# Patient Record
Sex: Male | Born: 1935
Health system: Southern US, Community
[De-identification: ages and names within clinical notes are randomized; demographics above are authoritative.]

## PROBLEM LIST (undated history)

## (undated) DIAGNOSIS — G4733 Obstructive sleep apnea (adult) (pediatric): Secondary | ICD-10-CM

## (undated) DIAGNOSIS — F5104 Psychophysiologic insomnia: Secondary | ICD-10-CM

## (undated) DIAGNOSIS — C801 Malignant (primary) neoplasm, unspecified: Secondary | ICD-10-CM

## (undated) DIAGNOSIS — M545 Low back pain, unspecified: Secondary | ICD-10-CM

## (undated) DIAGNOSIS — M4647 Discitis, unspecified, lumbosacral region: Secondary | ICD-10-CM

## (undated) DIAGNOSIS — E039 Hypothyroidism, unspecified: Secondary | ICD-10-CM

## (undated) DIAGNOSIS — I5030 Unspecified diastolic (congestive) heart failure: Secondary | ICD-10-CM

## (undated) DIAGNOSIS — Z9889 Other specified postprocedural states: Secondary | ICD-10-CM

## (undated) DIAGNOSIS — Z8701 Personal history of pneumonia (recurrent): Secondary | ICD-10-CM

## (undated) DIAGNOSIS — I2510 Atherosclerotic heart disease of native coronary artery without angina pectoris: Secondary | ICD-10-CM

## (undated) DIAGNOSIS — I4892 Unspecified atrial flutter: Secondary | ICD-10-CM

## (undated) DIAGNOSIS — G8929 Other chronic pain: Secondary | ICD-10-CM

## (undated) DIAGNOSIS — R112 Nausea with vomiting, unspecified: Secondary | ICD-10-CM

## (undated) DIAGNOSIS — K5909 Other constipation: Secondary | ICD-10-CM

## (undated) DIAGNOSIS — Z9989 Dependence on other enabling machines and devices: Secondary | ICD-10-CM

## (undated) DIAGNOSIS — R0600 Dyspnea, unspecified: Secondary | ICD-10-CM

## (undated) DIAGNOSIS — I10 Essential (primary) hypertension: Secondary | ICD-10-CM

## (undated) DIAGNOSIS — E785 Hyperlipidemia, unspecified: Secondary | ICD-10-CM

## (undated) DIAGNOSIS — B957 Other staphylococcus as the cause of diseases classified elsewhere: Secondary | ICD-10-CM

## (undated) DIAGNOSIS — T8859XA Other complications of anesthesia, initial encounter: Secondary | ICD-10-CM

## (undated) DIAGNOSIS — R430 Anosmia: Secondary | ICD-10-CM

## (undated) DIAGNOSIS — R7881 Bacteremia: Secondary | ICD-10-CM

## (undated) DIAGNOSIS — Z8719 Personal history of other diseases of the digestive system: Secondary | ICD-10-CM

## (undated) DIAGNOSIS — IMO0001 Reserved for inherently not codable concepts without codable children: Secondary | ICD-10-CM

## (undated) DIAGNOSIS — T4145XA Adverse effect of unspecified anesthetic, initial encounter: Secondary | ICD-10-CM

## (undated) DIAGNOSIS — M4620 Osteomyelitis of vertebra, site unspecified: Secondary | ICD-10-CM

## (undated) HISTORY — DX: Unspecified atrial flutter: I48.92

## (undated) HISTORY — DX: Other constipation: K59.09

## (undated) HISTORY — PX: REPLACEMENT TOTAL KNEE BILATERAL: SUR1225

## (undated) HISTORY — DX: Personal history of pneumonia (recurrent): Z87.01

## (undated) HISTORY — DX: Reserved for inherently not codable concepts without codable children: IMO0001

## (undated) HISTORY — DX: Low back pain, unspecified: M54.50

## (undated) HISTORY — DX: Other staphylococcus as the cause of diseases classified elsewhere: B95.7

## (undated) HISTORY — DX: Essential (primary) hypertension: I10

## (undated) HISTORY — DX: Other staphylococcus as the cause of diseases classified elsewhere: R78.81

## (undated) HISTORY — DX: Obstructive sleep apnea (adult) (pediatric): G47.33

## (undated) HISTORY — PX: NASAL SINUS SURGERY: SHX719

## (undated) HISTORY — DX: Personal history of other diseases of the digestive system: Z87.19

## (undated) HISTORY — DX: Atherosclerotic heart disease of native coronary artery without angina pectoris: I25.10

## (undated) HISTORY — DX: Unspecified diastolic (congestive) heart failure: I50.30

## (undated) HISTORY — DX: Hypothyroidism, unspecified: E03.9

## (undated) HISTORY — DX: Osteomyelitis of vertebra, site unspecified: M46.20

## (undated) HISTORY — DX: Dependence on other enabling machines and devices: Z99.89

## (undated) HISTORY — DX: Other chronic pain: G89.29

## (undated) HISTORY — DX: Psychophysiologic insomnia: F51.04

## (undated) HISTORY — DX: Low back pain: M54.5

## (undated) HISTORY — DX: Hyperlipidemia, unspecified: E78.5

## (undated) HISTORY — PX: ELBOW SURGERY: SHX618

## (undated) HISTORY — DX: Discitis, unspecified, lumbosacral region: M46.47

## (undated) HISTORY — DX: Anosmia: R43.0

---

## 2005-08-28 HISTORY — PX: COLONOSCOPY: SHX174

## 2011-04-20 DIAGNOSIS — M4800 Spinal stenosis, site unspecified: Secondary | ICD-10-CM | POA: Insufficient documentation

## 2011-08-29 DIAGNOSIS — Z8701 Personal history of pneumonia (recurrent): Secondary | ICD-10-CM

## 2011-08-29 HISTORY — DX: Personal history of pneumonia (recurrent): Z87.01

## 2011-08-29 HISTORY — PX: PERCUTANEOUS CORONARY STENT INTERVENTION (PCI-S): SHX6016

## 2011-09-21 DIAGNOSIS — R351 Nocturia: Secondary | ICD-10-CM | POA: Insufficient documentation

## 2011-09-21 DIAGNOSIS — M25569 Pain in unspecified knee: Secondary | ICD-10-CM | POA: Insufficient documentation

## 2011-09-21 DIAGNOSIS — K219 Gastro-esophageal reflux disease without esophagitis: Secondary | ICD-10-CM | POA: Insufficient documentation

## 2011-09-21 DIAGNOSIS — M79605 Pain in left leg: Secondary | ICD-10-CM | POA: Insufficient documentation

## 2011-10-09 DIAGNOSIS — N400 Enlarged prostate without lower urinary tract symptoms: Secondary | ICD-10-CM | POA: Insufficient documentation

## 2012-07-11 DIAGNOSIS — I2510 Atherosclerotic heart disease of native coronary artery without angina pectoris: Secondary | ICD-10-CM | POA: Insufficient documentation

## 2013-03-28 DIAGNOSIS — F3340 Major depressive disorder, recurrent, in remission, unspecified: Secondary | ICD-10-CM | POA: Insufficient documentation

## 2013-03-28 DIAGNOSIS — F3341 Major depressive disorder, recurrent, in partial remission: Secondary | ICD-10-CM | POA: Insufficient documentation

## 2015-08-29 DIAGNOSIS — Z8719 Personal history of other diseases of the digestive system: Secondary | ICD-10-CM

## 2015-08-29 HISTORY — DX: Personal history of other diseases of the digestive system: Z87.19

## 2015-11-15 DIAGNOSIS — G4733 Obstructive sleep apnea (adult) (pediatric): Secondary | ICD-10-CM | POA: Diagnosis not present

## 2015-11-19 DIAGNOSIS — J322 Chronic ethmoidal sinusitis: Secondary | ICD-10-CM | POA: Diagnosis not present

## 2015-11-19 DIAGNOSIS — J320 Chronic maxillary sinusitis: Secondary | ICD-10-CM | POA: Diagnosis not present

## 2015-11-19 DIAGNOSIS — R430 Anosmia: Secondary | ICD-10-CM | POA: Diagnosis not present

## 2015-11-23 DIAGNOSIS — I2510 Atherosclerotic heart disease of native coronary artery without angina pectoris: Secondary | ICD-10-CM | POA: Diagnosis not present

## 2015-11-23 DIAGNOSIS — I10 Essential (primary) hypertension: Secondary | ICD-10-CM | POA: Diagnosis not present

## 2015-11-23 DIAGNOSIS — E8881 Metabolic syndrome: Secondary | ICD-10-CM | POA: Diagnosis not present

## 2015-11-23 DIAGNOSIS — Z9861 Coronary angioplasty status: Secondary | ICD-10-CM | POA: Diagnosis not present

## 2015-12-08 DIAGNOSIS — I2510 Atherosclerotic heart disease of native coronary artery without angina pectoris: Secondary | ICD-10-CM | POA: Diagnosis not present

## 2015-12-08 DIAGNOSIS — Z79899 Other long term (current) drug therapy: Secondary | ICD-10-CM | POA: Diagnosis not present

## 2015-12-08 DIAGNOSIS — E785 Hyperlipidemia, unspecified: Secondary | ICD-10-CM | POA: Diagnosis not present

## 2015-12-08 DIAGNOSIS — R0602 Shortness of breath: Secondary | ICD-10-CM | POA: Diagnosis not present

## 2015-12-08 DIAGNOSIS — E039 Hypothyroidism, unspecified: Secondary | ICD-10-CM | POA: Diagnosis not present

## 2015-12-08 DIAGNOSIS — R7309 Other abnormal glucose: Secondary | ICD-10-CM | POA: Diagnosis not present

## 2015-12-08 DIAGNOSIS — I10 Essential (primary) hypertension: Secondary | ICD-10-CM | POA: Diagnosis not present

## 2015-12-09 DIAGNOSIS — L570 Actinic keratosis: Secondary | ICD-10-CM | POA: Diagnosis not present

## 2015-12-09 DIAGNOSIS — L723 Sebaceous cyst: Secondary | ICD-10-CM | POA: Diagnosis not present

## 2015-12-23 DIAGNOSIS — E669 Obesity, unspecified: Secondary | ICD-10-CM | POA: Diagnosis not present

## 2015-12-23 DIAGNOSIS — M545 Low back pain: Secondary | ICD-10-CM | POA: Diagnosis not present

## 2015-12-23 DIAGNOSIS — G894 Chronic pain syndrome: Secondary | ICD-10-CM | POA: Diagnosis not present

## 2015-12-23 DIAGNOSIS — M5136 Other intervertebral disc degeneration, lumbar region: Secondary | ICD-10-CM | POA: Diagnosis not present

## 2015-12-23 DIAGNOSIS — M47816 Spondylosis without myelopathy or radiculopathy, lumbar region: Secondary | ICD-10-CM | POA: Diagnosis not present

## 2016-01-09 DIAGNOSIS — M4316 Spondylolisthesis, lumbar region: Secondary | ICD-10-CM | POA: Insufficient documentation

## 2016-01-09 DIAGNOSIS — N281 Cyst of kidney, acquired: Secondary | ICD-10-CM | POA: Diagnosis not present

## 2016-01-09 DIAGNOSIS — I714 Abdominal aortic aneurysm, without rupture, unspecified: Secondary | ICD-10-CM | POA: Insufficient documentation

## 2016-01-09 DIAGNOSIS — I2510 Atherosclerotic heart disease of native coronary artery without angina pectoris: Secondary | ICD-10-CM | POA: Diagnosis not present

## 2016-01-09 DIAGNOSIS — K8020 Calculus of gallbladder without cholecystitis without obstruction: Secondary | ICD-10-CM | POA: Insufficient documentation

## 2016-01-09 DIAGNOSIS — N200 Calculus of kidney: Secondary | ICD-10-CM | POA: Diagnosis not present

## 2016-01-09 DIAGNOSIS — K7689 Other specified diseases of liver: Secondary | ICD-10-CM | POA: Diagnosis not present

## 2016-01-09 DIAGNOSIS — Z7982 Long term (current) use of aspirin: Secondary | ICD-10-CM | POA: Diagnosis not present

## 2016-01-09 DIAGNOSIS — I10 Essential (primary) hypertension: Secondary | ICD-10-CM | POA: Diagnosis not present

## 2016-01-09 DIAGNOSIS — K5732 Diverticulitis of large intestine without perforation or abscess without bleeding: Secondary | ICD-10-CM | POA: Insufficient documentation

## 2016-01-09 DIAGNOSIS — E039 Hypothyroidism, unspecified: Secondary | ICD-10-CM | POA: Diagnosis not present

## 2016-01-09 DIAGNOSIS — E782 Mixed hyperlipidemia: Secondary | ICD-10-CM | POA: Diagnosis not present

## 2016-01-12 DIAGNOSIS — K5732 Diverticulitis of large intestine without perforation or abscess without bleeding: Secondary | ICD-10-CM | POA: Diagnosis not present

## 2016-01-12 DIAGNOSIS — N183 Chronic kidney disease, stage 3 (moderate): Secondary | ICD-10-CM | POA: Diagnosis not present

## 2016-01-12 DIAGNOSIS — I129 Hypertensive chronic kidney disease with stage 1 through stage 4 chronic kidney disease, or unspecified chronic kidney disease: Secondary | ICD-10-CM | POA: Diagnosis not present

## 2016-01-12 DIAGNOSIS — I2510 Atherosclerotic heart disease of native coronary artery without angina pectoris: Secondary | ICD-10-CM | POA: Diagnosis not present

## 2016-02-03 DIAGNOSIS — G4733 Obstructive sleep apnea (adult) (pediatric): Secondary | ICD-10-CM | POA: Diagnosis not present

## 2016-02-09 DIAGNOSIS — K5901 Slow transit constipation: Secondary | ICD-10-CM | POA: Diagnosis not present

## 2016-02-09 DIAGNOSIS — Z0000 Encounter for general adult medical examination without abnormal findings: Secondary | ICD-10-CM | POA: Diagnosis not present

## 2016-02-09 DIAGNOSIS — F5101 Primary insomnia: Secondary | ICD-10-CM | POA: Diagnosis not present

## 2016-02-09 DIAGNOSIS — R700 Elevated erythrocyte sedimentation rate: Secondary | ICD-10-CM | POA: Diagnosis not present

## 2016-02-09 DIAGNOSIS — E782 Mixed hyperlipidemia: Secondary | ICD-10-CM | POA: Diagnosis not present

## 2016-02-09 DIAGNOSIS — M4806 Spinal stenosis, lumbar region: Secondary | ICD-10-CM | POA: Diagnosis not present

## 2016-02-09 DIAGNOSIS — I714 Abdominal aortic aneurysm, without rupture: Secondary | ICD-10-CM | POA: Diagnosis not present

## 2016-02-10 DIAGNOSIS — R700 Elevated erythrocyte sedimentation rate: Secondary | ICD-10-CM | POA: Diagnosis not present

## 2016-02-17 DIAGNOSIS — N183 Chronic kidney disease, stage 3 (moderate): Secondary | ICD-10-CM | POA: Diagnosis not present

## 2016-02-17 DIAGNOSIS — K5732 Diverticulitis of large intestine without perforation or abscess without bleeding: Secondary | ICD-10-CM | POA: Diagnosis not present

## 2016-02-17 DIAGNOSIS — I129 Hypertensive chronic kidney disease with stage 1 through stage 4 chronic kidney disease, or unspecified chronic kidney disease: Secondary | ICD-10-CM | POA: Diagnosis not present

## 2016-02-21 DIAGNOSIS — J339 Nasal polyp, unspecified: Secondary | ICD-10-CM | POA: Diagnosis not present

## 2016-02-21 DIAGNOSIS — J320 Chronic maxillary sinusitis: Secondary | ICD-10-CM | POA: Diagnosis not present

## 2016-02-21 DIAGNOSIS — R430 Anosmia: Secondary | ICD-10-CM | POA: Diagnosis not present

## 2016-02-24 DIAGNOSIS — M9964 Osseous and subluxation stenosis of intervertebral foramina of sacral region: Secondary | ICD-10-CM | POA: Diagnosis not present

## 2016-02-24 DIAGNOSIS — M4727 Other spondylosis with radiculopathy, lumbosacral region: Secondary | ICD-10-CM | POA: Diagnosis not present

## 2016-02-24 DIAGNOSIS — M4726 Other spondylosis with radiculopathy, lumbar region: Secondary | ICD-10-CM | POA: Diagnosis not present

## 2016-02-24 DIAGNOSIS — G8929 Other chronic pain: Secondary | ICD-10-CM | POA: Diagnosis not present

## 2016-02-24 DIAGNOSIS — M9963 Osseous and subluxation stenosis of intervertebral foramina of lumbar region: Secondary | ICD-10-CM | POA: Diagnosis not present

## 2016-02-24 DIAGNOSIS — M5416 Radiculopathy, lumbar region: Secondary | ICD-10-CM | POA: Diagnosis not present

## 2016-03-16 DIAGNOSIS — E781 Pure hyperglyceridemia: Secondary | ICD-10-CM | POA: Diagnosis not present

## 2016-03-16 DIAGNOSIS — E039 Hypothyroidism, unspecified: Secondary | ICD-10-CM | POA: Diagnosis not present

## 2016-03-16 DIAGNOSIS — Z79899 Other long term (current) drug therapy: Secondary | ICD-10-CM | POA: Diagnosis not present

## 2016-03-22 DIAGNOSIS — M5416 Radiculopathy, lumbar region: Secondary | ICD-10-CM | POA: Diagnosis not present

## 2016-03-22 DIAGNOSIS — G8929 Other chronic pain: Secondary | ICD-10-CM | POA: Diagnosis not present

## 2016-03-22 DIAGNOSIS — F5101 Primary insomnia: Secondary | ICD-10-CM | POA: Diagnosis not present

## 2016-04-04 DIAGNOSIS — G4733 Obstructive sleep apnea (adult) (pediatric): Secondary | ICD-10-CM | POA: Diagnosis not present

## 2016-04-04 DIAGNOSIS — Z01818 Encounter for other preprocedural examination: Secondary | ICD-10-CM | POA: Diagnosis not present

## 2016-04-04 DIAGNOSIS — K219 Gastro-esophageal reflux disease without esophagitis: Secondary | ICD-10-CM | POA: Diagnosis not present

## 2016-04-04 DIAGNOSIS — N183 Chronic kidney disease, stage 3 (moderate): Secondary | ICD-10-CM | POA: Diagnosis not present

## 2016-04-04 DIAGNOSIS — I129 Hypertensive chronic kidney disease with stage 1 through stage 4 chronic kidney disease, or unspecified chronic kidney disease: Secondary | ICD-10-CM | POA: Diagnosis not present

## 2016-04-04 DIAGNOSIS — M4806 Spinal stenosis, lumbar region: Secondary | ICD-10-CM | POA: Diagnosis not present

## 2016-04-04 DIAGNOSIS — I2510 Atherosclerotic heart disease of native coronary artery without angina pectoris: Secondary | ICD-10-CM | POA: Diagnosis not present

## 2016-04-04 DIAGNOSIS — K5901 Slow transit constipation: Secondary | ICD-10-CM | POA: Diagnosis not present

## 2016-04-04 DIAGNOSIS — E039 Hypothyroidism, unspecified: Secondary | ICD-10-CM | POA: Diagnosis not present

## 2016-04-18 DIAGNOSIS — M4806 Spinal stenosis, lumbar region: Secondary | ICD-10-CM | POA: Diagnosis not present

## 2016-04-18 DIAGNOSIS — M4726 Other spondylosis with radiculopathy, lumbar region: Secondary | ICD-10-CM | POA: Diagnosis not present

## 2016-04-18 DIAGNOSIS — G8929 Other chronic pain: Secondary | ICD-10-CM | POA: Diagnosis not present

## 2016-04-18 DIAGNOSIS — M545 Low back pain: Secondary | ICD-10-CM | POA: Diagnosis not present

## 2016-04-18 DIAGNOSIS — M5416 Radiculopathy, lumbar region: Secondary | ICD-10-CM | POA: Diagnosis not present

## 2016-04-18 DIAGNOSIS — M4727 Other spondylosis with radiculopathy, lumbosacral region: Secondary | ICD-10-CM | POA: Diagnosis not present

## 2016-04-20 DIAGNOSIS — J301 Allergic rhinitis due to pollen: Secondary | ICD-10-CM | POA: Diagnosis not present

## 2016-04-20 DIAGNOSIS — M4806 Spinal stenosis, lumbar region: Secondary | ICD-10-CM | POA: Diagnosis not present

## 2016-04-20 DIAGNOSIS — N183 Chronic kidney disease, stage 3 (moderate): Secondary | ICD-10-CM | POA: Diagnosis not present

## 2016-04-20 DIAGNOSIS — H01002 Unspecified blepharitis right lower eyelid: Secondary | ICD-10-CM | POA: Diagnosis not present

## 2016-04-20 DIAGNOSIS — I129 Hypertensive chronic kidney disease with stage 1 through stage 4 chronic kidney disease, or unspecified chronic kidney disease: Secondary | ICD-10-CM | POA: Diagnosis not present

## 2016-04-20 DIAGNOSIS — H01005 Unspecified blepharitis left lower eyelid: Secondary | ICD-10-CM | POA: Diagnosis not present

## 2016-04-27 DIAGNOSIS — Z01810 Encounter for preprocedural cardiovascular examination: Secondary | ICD-10-CM | POA: Diagnosis not present

## 2016-04-27 DIAGNOSIS — M545 Low back pain: Secondary | ICD-10-CM | POA: Diagnosis not present

## 2016-05-02 DIAGNOSIS — H02839 Dermatochalasis of unspecified eye, unspecified eyelid: Secondary | ICD-10-CM | POA: Diagnosis not present

## 2016-05-02 DIAGNOSIS — H1045 Other chronic allergic conjunctivitis: Secondary | ICD-10-CM | POA: Diagnosis not present

## 2016-05-02 DIAGNOSIS — H43813 Vitreous degeneration, bilateral: Secondary | ICD-10-CM | POA: Diagnosis not present

## 2016-05-02 DIAGNOSIS — H2513 Age-related nuclear cataract, bilateral: Secondary | ICD-10-CM | POA: Diagnosis not present

## 2016-05-05 DIAGNOSIS — M5416 Radiculopathy, lumbar region: Secondary | ICD-10-CM | POA: Diagnosis not present

## 2016-05-05 DIAGNOSIS — M4726 Other spondylosis with radiculopathy, lumbar region: Secondary | ICD-10-CM | POA: Diagnosis not present

## 2016-05-05 DIAGNOSIS — M9963 Osseous and subluxation stenosis of intervertebral foramina of lumbar region: Secondary | ICD-10-CM | POA: Diagnosis not present

## 2016-05-05 DIAGNOSIS — M9964 Osseous and subluxation stenosis of intervertebral foramina of sacral region: Secondary | ICD-10-CM | POA: Diagnosis not present

## 2016-05-05 DIAGNOSIS — G8929 Other chronic pain: Secondary | ICD-10-CM | POA: Diagnosis not present

## 2016-05-05 DIAGNOSIS — M4727 Other spondylosis with radiculopathy, lumbosacral region: Secondary | ICD-10-CM | POA: Diagnosis not present

## 2016-05-18 DIAGNOSIS — Z23 Encounter for immunization: Secondary | ICD-10-CM | POA: Diagnosis not present

## 2016-05-25 DIAGNOSIS — D485 Neoplasm of uncertain behavior of skin: Secondary | ICD-10-CM | POA: Diagnosis not present

## 2016-05-25 DIAGNOSIS — Z85828 Personal history of other malignant neoplasm of skin: Secondary | ICD-10-CM | POA: Diagnosis not present

## 2016-05-25 DIAGNOSIS — L570 Actinic keratosis: Secondary | ICD-10-CM | POA: Diagnosis not present

## 2016-05-25 DIAGNOSIS — C44519 Basal cell carcinoma of skin of other part of trunk: Secondary | ICD-10-CM | POA: Diagnosis not present

## 2016-05-25 DIAGNOSIS — L820 Inflamed seborrheic keratosis: Secondary | ICD-10-CM | POA: Diagnosis not present

## 2016-05-30 DIAGNOSIS — G4700 Insomnia, unspecified: Secondary | ICD-10-CM | POA: Diagnosis not present

## 2016-06-20 DIAGNOSIS — M5416 Radiculopathy, lumbar region: Secondary | ICD-10-CM | POA: Diagnosis not present

## 2016-06-20 DIAGNOSIS — M4727 Other spondylosis with radiculopathy, lumbosacral region: Secondary | ICD-10-CM | POA: Diagnosis not present

## 2016-06-20 DIAGNOSIS — G8929 Other chronic pain: Secondary | ICD-10-CM | POA: Diagnosis not present

## 2016-06-20 DIAGNOSIS — M4726 Other spondylosis with radiculopathy, lumbar region: Secondary | ICD-10-CM | POA: Diagnosis not present

## 2016-06-20 DIAGNOSIS — M48061 Spinal stenosis, lumbar region without neurogenic claudication: Secondary | ICD-10-CM | POA: Diagnosis not present

## 2016-06-28 HISTORY — PX: SPINAL CORD STIMULATOR IMPLANT: SHX2422

## 2016-07-11 DIAGNOSIS — D485 Neoplasm of uncertain behavior of skin: Secondary | ICD-10-CM | POA: Diagnosis not present

## 2016-07-11 DIAGNOSIS — C44519 Basal cell carcinoma of skin of other part of trunk: Secondary | ICD-10-CM | POA: Diagnosis not present

## 2016-07-11 DIAGNOSIS — L820 Inflamed seborrheic keratosis: Secondary | ICD-10-CM | POA: Diagnosis not present

## 2016-07-11 DIAGNOSIS — Z85828 Personal history of other malignant neoplasm of skin: Secondary | ICD-10-CM | POA: Diagnosis not present

## 2016-07-11 DIAGNOSIS — L570 Actinic keratosis: Secondary | ICD-10-CM | POA: Diagnosis not present

## 2016-07-18 DIAGNOSIS — G8929 Other chronic pain: Secondary | ICD-10-CM | POA: Diagnosis not present

## 2016-07-18 DIAGNOSIS — M5416 Radiculopathy, lumbar region: Secondary | ICD-10-CM | POA: Diagnosis not present

## 2016-07-18 DIAGNOSIS — M4727 Other spondylosis with radiculopathy, lumbosacral region: Secondary | ICD-10-CM | POA: Diagnosis not present

## 2016-07-18 DIAGNOSIS — M4726 Other spondylosis with radiculopathy, lumbar region: Secondary | ICD-10-CM | POA: Diagnosis not present

## 2016-08-01 DIAGNOSIS — M4727 Other spondylosis with radiculopathy, lumbosacral region: Secondary | ICD-10-CM | POA: Diagnosis not present

## 2016-08-01 DIAGNOSIS — G8929 Other chronic pain: Secondary | ICD-10-CM | POA: Diagnosis not present

## 2016-08-01 DIAGNOSIS — M5416 Radiculopathy, lumbar region: Secondary | ICD-10-CM | POA: Diagnosis not present

## 2016-08-01 DIAGNOSIS — M4726 Other spondylosis with radiculopathy, lumbar region: Secondary | ICD-10-CM | POA: Diagnosis not present

## 2016-08-28 DIAGNOSIS — I4892 Unspecified atrial flutter: Secondary | ICD-10-CM

## 2016-08-28 HISTORY — DX: Unspecified atrial flutter: I48.92

## 2016-09-11 DIAGNOSIS — I6523 Occlusion and stenosis of bilateral carotid arteries: Secondary | ICD-10-CM | POA: Diagnosis not present

## 2016-09-20 DIAGNOSIS — M5416 Radiculopathy, lumbar region: Secondary | ICD-10-CM | POA: Diagnosis not present

## 2016-09-20 DIAGNOSIS — M4726 Other spondylosis with radiculopathy, lumbar region: Secondary | ICD-10-CM | POA: Diagnosis not present

## 2016-09-20 DIAGNOSIS — G8929 Other chronic pain: Secondary | ICD-10-CM | POA: Diagnosis not present

## 2016-09-20 DIAGNOSIS — M4727 Other spondylosis with radiculopathy, lumbosacral region: Secondary | ICD-10-CM | POA: Diagnosis not present

## 2016-10-26 DIAGNOSIS — Z85828 Personal history of other malignant neoplasm of skin: Secondary | ICD-10-CM | POA: Diagnosis not present

## 2016-10-26 DIAGNOSIS — L219 Seborrheic dermatitis, unspecified: Secondary | ICD-10-CM | POA: Diagnosis not present

## 2016-10-26 DIAGNOSIS — L570 Actinic keratosis: Secondary | ICD-10-CM | POA: Diagnosis not present

## 2016-11-27 DIAGNOSIS — I129 Hypertensive chronic kidney disease with stage 1 through stage 4 chronic kidney disease, or unspecified chronic kidney disease: Secondary | ICD-10-CM | POA: Diagnosis not present

## 2016-11-27 DIAGNOSIS — G4733 Obstructive sleep apnea (adult) (pediatric): Secondary | ICD-10-CM | POA: Diagnosis not present

## 2016-11-27 DIAGNOSIS — R0602 Shortness of breath: Secondary | ICD-10-CM | POA: Diagnosis not present

## 2016-11-27 DIAGNOSIS — N183 Chronic kidney disease, stage 3 (moderate): Secondary | ICD-10-CM | POA: Diagnosis not present

## 2016-11-27 DIAGNOSIS — J301 Allergic rhinitis due to pollen: Secondary | ICD-10-CM | POA: Diagnosis not present

## 2016-11-27 DIAGNOSIS — R51 Headache: Secondary | ICD-10-CM | POA: Diagnosis not present

## 2016-11-27 DIAGNOSIS — J3489 Other specified disorders of nose and nasal sinuses: Secondary | ICD-10-CM | POA: Diagnosis not present

## 2016-11-27 DIAGNOSIS — R05 Cough: Secondary | ICD-10-CM | POA: Diagnosis not present

## 2016-11-27 DIAGNOSIS — R062 Wheezing: Secondary | ICD-10-CM | POA: Diagnosis not present

## 2016-11-28 DIAGNOSIS — G4700 Insomnia, unspecified: Secondary | ICD-10-CM | POA: Diagnosis not present

## 2016-12-21 DIAGNOSIS — G4700 Insomnia, unspecified: Secondary | ICD-10-CM | POA: Diagnosis not present

## 2016-12-21 DIAGNOSIS — G4733 Obstructive sleep apnea (adult) (pediatric): Secondary | ICD-10-CM | POA: Diagnosis not present

## 2017-01-04 DIAGNOSIS — G4733 Obstructive sleep apnea (adult) (pediatric): Secondary | ICD-10-CM | POA: Diagnosis not present

## 2017-01-30 DIAGNOSIS — L910 Hypertrophic scar: Secondary | ICD-10-CM | POA: Diagnosis not present

## 2017-01-30 DIAGNOSIS — L821 Other seborrheic keratosis: Secondary | ICD-10-CM | POA: Diagnosis not present

## 2017-01-30 DIAGNOSIS — L570 Actinic keratosis: Secondary | ICD-10-CM | POA: Diagnosis not present

## 2017-01-30 DIAGNOSIS — Z85828 Personal history of other malignant neoplasm of skin: Secondary | ICD-10-CM | POA: Diagnosis not present

## 2017-01-30 DIAGNOSIS — D485 Neoplasm of uncertain behavior of skin: Secondary | ICD-10-CM | POA: Diagnosis not present

## 2017-01-30 DIAGNOSIS — L219 Seborrheic dermatitis, unspecified: Secondary | ICD-10-CM | POA: Diagnosis not present

## 2017-01-30 DIAGNOSIS — L820 Inflamed seborrheic keratosis: Secondary | ICD-10-CM | POA: Diagnosis not present

## 2017-01-30 DIAGNOSIS — C44519 Basal cell carcinoma of skin of other part of trunk: Secondary | ICD-10-CM | POA: Diagnosis not present

## 2017-02-12 DIAGNOSIS — I2510 Atherosclerotic heart disease of native coronary artery without angina pectoris: Secondary | ICD-10-CM | POA: Diagnosis not present

## 2017-02-12 DIAGNOSIS — R351 Nocturia: Secondary | ICD-10-CM | POA: Diagnosis not present

## 2017-02-12 DIAGNOSIS — I129 Hypertensive chronic kidney disease with stage 1 through stage 4 chronic kidney disease, or unspecified chronic kidney disease: Secondary | ICD-10-CM | POA: Diagnosis not present

## 2017-02-12 DIAGNOSIS — E782 Mixed hyperlipidemia: Secondary | ICD-10-CM | POA: Diagnosis not present

## 2017-02-12 DIAGNOSIS — E039 Hypothyroidism, unspecified: Secondary | ICD-10-CM | POA: Diagnosis not present

## 2017-02-12 DIAGNOSIS — M48062 Spinal stenosis, lumbar region with neurogenic claudication: Secondary | ICD-10-CM | POA: Diagnosis not present

## 2017-02-12 DIAGNOSIS — M4316 Spondylolisthesis, lumbar region: Secondary | ICD-10-CM | POA: Diagnosis not present

## 2017-02-12 DIAGNOSIS — I714 Abdominal aortic aneurysm, without rupture: Secondary | ICD-10-CM | POA: Diagnosis not present

## 2017-02-12 DIAGNOSIS — G4733 Obstructive sleep apnea (adult) (pediatric): Secondary | ICD-10-CM | POA: Diagnosis not present

## 2017-02-12 DIAGNOSIS — N401 Enlarged prostate with lower urinary tract symptoms: Secondary | ICD-10-CM | POA: Diagnosis not present

## 2017-02-12 DIAGNOSIS — K219 Gastro-esophageal reflux disease without esophagitis: Secondary | ICD-10-CM | POA: Diagnosis not present

## 2017-02-12 DIAGNOSIS — Z0000 Encounter for general adult medical examination without abnormal findings: Secondary | ICD-10-CM | POA: Diagnosis not present

## 2017-02-14 DIAGNOSIS — Z79899 Other long term (current) drug therapy: Secondary | ICD-10-CM | POA: Insufficient documentation

## 2017-02-17 DIAGNOSIS — I2510 Atherosclerotic heart disease of native coronary artery without angina pectoris: Secondary | ICD-10-CM | POA: Diagnosis not present

## 2017-02-17 DIAGNOSIS — I130 Hypertensive heart and chronic kidney disease with heart failure and stage 1 through stage 4 chronic kidney disease, or unspecified chronic kidney disease: Secondary | ICD-10-CM | POA: Diagnosis not present

## 2017-02-17 DIAGNOSIS — R609 Edema, unspecified: Secondary | ICD-10-CM | POA: Diagnosis not present

## 2017-02-17 DIAGNOSIS — I5031 Acute diastolic (congestive) heart failure: Secondary | ICD-10-CM | POA: Diagnosis not present

## 2017-02-17 DIAGNOSIS — R0602 Shortness of breath: Secondary | ICD-10-CM | POA: Diagnosis not present

## 2017-02-17 DIAGNOSIS — R938 Abnormal findings on diagnostic imaging of other specified body structures: Secondary | ICD-10-CM | POA: Diagnosis not present

## 2017-02-17 DIAGNOSIS — I10 Essential (primary) hypertension: Secondary | ICD-10-CM | POA: Diagnosis not present

## 2017-02-17 DIAGNOSIS — R7989 Other specified abnormal findings of blood chemistry: Secondary | ICD-10-CM | POA: Diagnosis not present

## 2017-02-17 DIAGNOSIS — E782 Mixed hyperlipidemia: Secondary | ICD-10-CM | POA: Diagnosis not present

## 2017-02-17 DIAGNOSIS — R748 Abnormal levels of other serum enzymes: Secondary | ICD-10-CM | POA: Diagnosis not present

## 2017-02-17 DIAGNOSIS — N401 Enlarged prostate with lower urinary tract symptoms: Secondary | ICD-10-CM | POA: Diagnosis not present

## 2017-02-17 DIAGNOSIS — F3341 Major depressive disorder, recurrent, in partial remission: Secondary | ICD-10-CM | POA: Diagnosis not present

## 2017-02-18 DIAGNOSIS — R0602 Shortness of breath: Secondary | ICD-10-CM | POA: Insufficient documentation

## 2017-02-18 DIAGNOSIS — I129 Hypertensive chronic kidney disease with stage 1 through stage 4 chronic kidney disease, or unspecified chronic kidney disease: Secondary | ICD-10-CM | POA: Diagnosis not present

## 2017-02-18 DIAGNOSIS — K219 Gastro-esophageal reflux disease without esophagitis: Secondary | ICD-10-CM | POA: Diagnosis not present

## 2017-02-18 DIAGNOSIS — R938 Abnormal findings on diagnostic imaging of other specified body structures: Secondary | ICD-10-CM | POA: Diagnosis not present

## 2017-02-18 DIAGNOSIS — Z955 Presence of coronary angioplasty implant and graft: Secondary | ICD-10-CM | POA: Diagnosis not present

## 2017-02-18 DIAGNOSIS — N401 Enlarged prostate with lower urinary tract symptoms: Secondary | ICD-10-CM | POA: Diagnosis not present

## 2017-02-18 DIAGNOSIS — F3341 Major depressive disorder, recurrent, in partial remission: Secondary | ICD-10-CM | POA: Diagnosis not present

## 2017-02-18 DIAGNOSIS — Z6836 Body mass index (BMI) 36.0-36.9, adult: Secondary | ICD-10-CM | POA: Diagnosis not present

## 2017-02-18 DIAGNOSIS — I509 Heart failure, unspecified: Secondary | ICD-10-CM | POA: Diagnosis not present

## 2017-02-18 DIAGNOSIS — R3914 Feeling of incomplete bladder emptying: Secondary | ICD-10-CM | POA: Diagnosis present

## 2017-02-18 DIAGNOSIS — N183 Chronic kidney disease, stage 3 (moderate): Secondary | ICD-10-CM | POA: Diagnosis present

## 2017-02-18 DIAGNOSIS — I130 Hypertensive heart and chronic kidney disease with heart failure and stage 1 through stage 4 chronic kidney disease, or unspecified chronic kidney disease: Secondary | ICD-10-CM | POA: Diagnosis present

## 2017-02-18 DIAGNOSIS — E669 Obesity, unspecified: Secondary | ICD-10-CM | POA: Diagnosis present

## 2017-02-18 DIAGNOSIS — I5030 Unspecified diastolic (congestive) heart failure: Secondary | ICD-10-CM | POA: Insufficient documentation

## 2017-02-18 DIAGNOSIS — E782 Mixed hyperlipidemia: Secondary | ICD-10-CM | POA: Diagnosis not present

## 2017-02-18 DIAGNOSIS — I2510 Atherosclerotic heart disease of native coronary artery without angina pectoris: Secondary | ICD-10-CM | POA: Diagnosis not present

## 2017-02-18 DIAGNOSIS — M48062 Spinal stenosis, lumbar region with neurogenic claudication: Secondary | ICD-10-CM | POA: Diagnosis not present

## 2017-02-18 DIAGNOSIS — G4733 Obstructive sleep apnea (adult) (pediatric): Secondary | ICD-10-CM | POA: Diagnosis not present

## 2017-02-18 DIAGNOSIS — E039 Hypothyroidism, unspecified: Secondary | ICD-10-CM | POA: Diagnosis not present

## 2017-02-18 DIAGNOSIS — I5031 Acute diastolic (congestive) heart failure: Secondary | ICD-10-CM | POA: Diagnosis present

## 2017-02-26 DIAGNOSIS — M9963 Osseous and subluxation stenosis of intervertebral foramina of lumbar region: Secondary | ICD-10-CM | POA: Diagnosis not present

## 2017-02-26 DIAGNOSIS — M5416 Radiculopathy, lumbar region: Secondary | ICD-10-CM | POA: Diagnosis not present

## 2017-02-26 DIAGNOSIS — G8929 Other chronic pain: Secondary | ICD-10-CM | POA: Diagnosis not present

## 2017-02-26 DIAGNOSIS — M4726 Other spondylosis with radiculopathy, lumbar region: Secondary | ICD-10-CM | POA: Diagnosis not present

## 2017-02-26 DIAGNOSIS — M4727 Other spondylosis with radiculopathy, lumbosacral region: Secondary | ICD-10-CM | POA: Diagnosis not present

## 2017-02-26 DIAGNOSIS — M9964 Osseous and subluxation stenosis of intervertebral foramina of sacral region: Secondary | ICD-10-CM | POA: Diagnosis not present

## 2017-03-01 DIAGNOSIS — E782 Mixed hyperlipidemia: Secondary | ICD-10-CM | POA: Diagnosis not present

## 2017-03-01 DIAGNOSIS — Z09 Encounter for follow-up examination after completed treatment for conditions other than malignant neoplasm: Secondary | ICD-10-CM | POA: Diagnosis not present

## 2017-03-01 DIAGNOSIS — J301 Allergic rhinitis due to pollen: Secondary | ICD-10-CM | POA: Diagnosis not present

## 2017-03-01 DIAGNOSIS — N401 Enlarged prostate with lower urinary tract symptoms: Secondary | ICD-10-CM | POA: Diagnosis not present

## 2017-03-01 DIAGNOSIS — I2510 Atherosclerotic heart disease of native coronary artery without angina pectoris: Secondary | ICD-10-CM | POA: Diagnosis not present

## 2017-03-01 DIAGNOSIS — N400 Enlarged prostate without lower urinary tract symptoms: Secondary | ICD-10-CM | POA: Diagnosis not present

## 2017-03-01 DIAGNOSIS — I509 Heart failure, unspecified: Secondary | ICD-10-CM | POA: Diagnosis not present

## 2017-03-01 DIAGNOSIS — I5033 Acute on chronic diastolic (congestive) heart failure: Secondary | ICD-10-CM | POA: Diagnosis not present

## 2017-03-01 DIAGNOSIS — I129 Hypertensive chronic kidney disease with stage 1 through stage 4 chronic kidney disease, or unspecified chronic kidney disease: Secondary | ICD-10-CM | POA: Diagnosis not present

## 2017-03-01 DIAGNOSIS — K5732 Diverticulitis of large intestine without perforation or abscess without bleeding: Secondary | ICD-10-CM | POA: Diagnosis not present

## 2017-03-01 DIAGNOSIS — E781 Pure hyperglyceridemia: Secondary | ICD-10-CM | POA: Diagnosis not present

## 2017-03-01 DIAGNOSIS — F5101 Primary insomnia: Secondary | ICD-10-CM | POA: Diagnosis not present

## 2017-03-01 DIAGNOSIS — E039 Hypothyroidism, unspecified: Secondary | ICD-10-CM | POA: Diagnosis not present

## 2017-03-03 DIAGNOSIS — Z09 Encounter for follow-up examination after completed treatment for conditions other than malignant neoplasm: Secondary | ICD-10-CM | POA: Insufficient documentation

## 2017-03-07 DIAGNOSIS — I483 Typical atrial flutter: Secondary | ICD-10-CM | POA: Diagnosis not present

## 2017-03-07 DIAGNOSIS — I444 Left anterior fascicular block: Secondary | ICD-10-CM | POA: Diagnosis not present

## 2017-03-07 DIAGNOSIS — I2510 Atherosclerotic heart disease of native coronary artery without angina pectoris: Secondary | ICD-10-CM | POA: Diagnosis not present

## 2017-03-07 DIAGNOSIS — N182 Chronic kidney disease, stage 2 (mild): Secondary | ICD-10-CM | POA: Diagnosis not present

## 2017-03-07 DIAGNOSIS — I5032 Chronic diastolic (congestive) heart failure: Secondary | ICD-10-CM | POA: Diagnosis not present

## 2017-03-07 DIAGNOSIS — E782 Mixed hyperlipidemia: Secondary | ICD-10-CM | POA: Diagnosis not present

## 2017-03-07 DIAGNOSIS — E8881 Metabolic syndrome: Secondary | ICD-10-CM | POA: Diagnosis not present

## 2017-03-07 DIAGNOSIS — I10 Essential (primary) hypertension: Secondary | ICD-10-CM | POA: Diagnosis not present

## 2017-03-07 DIAGNOSIS — E6609 Other obesity due to excess calories: Secondary | ICD-10-CM | POA: Diagnosis not present

## 2017-03-07 DIAGNOSIS — G4730 Sleep apnea, unspecified: Secondary | ICD-10-CM | POA: Diagnosis not present

## 2017-03-07 DIAGNOSIS — Z9861 Coronary angioplasty status: Secondary | ICD-10-CM | POA: Diagnosis not present

## 2017-03-08 DIAGNOSIS — C44519 Basal cell carcinoma of skin of other part of trunk: Secondary | ICD-10-CM | POA: Diagnosis not present

## 2017-03-09 DIAGNOSIS — I484 Atypical atrial flutter: Secondary | ICD-10-CM | POA: Diagnosis not present

## 2017-03-21 DIAGNOSIS — I483 Typical atrial flutter: Secondary | ICD-10-CM | POA: Diagnosis not present

## 2017-03-21 DIAGNOSIS — E782 Mixed hyperlipidemia: Secondary | ICD-10-CM | POA: Diagnosis not present

## 2017-03-21 DIAGNOSIS — E6609 Other obesity due to excess calories: Secondary | ICD-10-CM | POA: Diagnosis not present

## 2017-03-21 DIAGNOSIS — E8881 Metabolic syndrome: Secondary | ICD-10-CM | POA: Diagnosis not present

## 2017-03-21 DIAGNOSIS — I10 Essential (primary) hypertension: Secondary | ICD-10-CM | POA: Diagnosis not present

## 2017-03-21 DIAGNOSIS — Z9861 Coronary angioplasty status: Secondary | ICD-10-CM | POA: Diagnosis not present

## 2017-03-21 DIAGNOSIS — I444 Left anterior fascicular block: Secondary | ICD-10-CM | POA: Diagnosis not present

## 2017-03-21 DIAGNOSIS — G4730 Sleep apnea, unspecified: Secondary | ICD-10-CM | POA: Diagnosis not present

## 2017-03-21 DIAGNOSIS — N182 Chronic kidney disease, stage 2 (mild): Secondary | ICD-10-CM | POA: Diagnosis not present

## 2017-03-21 DIAGNOSIS — I5032 Chronic diastolic (congestive) heart failure: Secondary | ICD-10-CM | POA: Diagnosis not present

## 2017-03-21 DIAGNOSIS — I2510 Atherosclerotic heart disease of native coronary artery without angina pectoris: Secondary | ICD-10-CM | POA: Diagnosis not present

## 2017-03-22 DIAGNOSIS — G4733 Obstructive sleep apnea (adult) (pediatric): Secondary | ICD-10-CM | POA: Diagnosis not present

## 2017-03-22 DIAGNOSIS — G4700 Insomnia, unspecified: Secondary | ICD-10-CM | POA: Diagnosis not present

## 2017-03-30 DIAGNOSIS — Z7901 Long term (current) use of anticoagulants: Secondary | ICD-10-CM | POA: Diagnosis not present

## 2017-03-30 DIAGNOSIS — I5032 Chronic diastolic (congestive) heart failure: Secondary | ICD-10-CM | POA: Diagnosis not present

## 2017-03-30 DIAGNOSIS — I483 Typical atrial flutter: Secondary | ICD-10-CM | POA: Diagnosis not present

## 2017-03-30 DIAGNOSIS — I10 Essential (primary) hypertension: Secondary | ICD-10-CM | POA: Diagnosis not present

## 2017-03-30 DIAGNOSIS — R002 Palpitations: Secondary | ICD-10-CM | POA: Diagnosis not present

## 2017-03-30 DIAGNOSIS — I481 Persistent atrial fibrillation: Secondary | ICD-10-CM | POA: Diagnosis not present

## 2017-03-30 DIAGNOSIS — E782 Mixed hyperlipidemia: Secondary | ICD-10-CM | POA: Diagnosis not present

## 2017-03-30 DIAGNOSIS — E8881 Metabolic syndrome: Secondary | ICD-10-CM | POA: Diagnosis not present

## 2017-04-09 ENCOUNTER — Encounter: Payer: Self-pay | Admitting: Family Medicine

## 2017-04-12 ENCOUNTER — Other Ambulatory Visit: Payer: Self-pay | Admitting: Family Medicine

## 2017-04-12 ENCOUNTER — Encounter: Payer: Self-pay | Admitting: Family Medicine

## 2017-04-12 DIAGNOSIS — N138 Other obstructive and reflux uropathy: Secondary | ICD-10-CM | POA: Insufficient documentation

## 2017-04-12 DIAGNOSIS — Z86007 Personal history of in-situ neoplasm of skin: Secondary | ICD-10-CM | POA: Insufficient documentation

## 2017-04-12 DIAGNOSIS — G4733 Obstructive sleep apnea (adult) (pediatric): Secondary | ICD-10-CM | POA: Insufficient documentation

## 2017-04-12 DIAGNOSIS — N400 Enlarged prostate without lower urinary tract symptoms: Secondary | ICD-10-CM | POA: Insufficient documentation

## 2017-04-12 DIAGNOSIS — E782 Mixed hyperlipidemia: Secondary | ICD-10-CM | POA: Insufficient documentation

## 2017-04-12 DIAGNOSIS — Z85828 Personal history of other malignant neoplasm of skin: Secondary | ICD-10-CM | POA: Insufficient documentation

## 2017-04-12 DIAGNOSIS — K5909 Other constipation: Secondary | ICD-10-CM | POA: Insufficient documentation

## 2017-04-12 DIAGNOSIS — Z9989 Dependence on other enabling machines and devices: Secondary | ICD-10-CM

## 2017-04-13 ENCOUNTER — Encounter: Payer: Self-pay | Admitting: Family Medicine

## 2017-04-15 ENCOUNTER — Encounter: Payer: Self-pay | Admitting: Family Medicine

## 2017-04-15 DIAGNOSIS — Z8679 Personal history of other diseases of the circulatory system: Secondary | ICD-10-CM | POA: Insufficient documentation

## 2017-04-16 ENCOUNTER — Ambulatory Visit (INDEPENDENT_AMBULATORY_CARE_PROVIDER_SITE_OTHER): Payer: Medicare Other | Admitting: Family Medicine

## 2017-04-16 ENCOUNTER — Encounter: Payer: Self-pay | Admitting: Family Medicine

## 2017-04-16 ENCOUNTER — Encounter (INDEPENDENT_AMBULATORY_CARE_PROVIDER_SITE_OTHER): Payer: Self-pay

## 2017-04-16 VITALS — BP 138/74 | HR 89 | Temp 97.7°F | Ht 72.5 in | Wt 287.0 lb

## 2017-04-16 DIAGNOSIS — E669 Obesity, unspecified: Secondary | ICD-10-CM | POA: Diagnosis not present

## 2017-04-16 DIAGNOSIS — M545 Low back pain: Secondary | ICD-10-CM | POA: Diagnosis not present

## 2017-04-16 DIAGNOSIS — F5104 Psychophysiologic insomnia: Secondary | ICD-10-CM | POA: Diagnosis not present

## 2017-04-16 DIAGNOSIS — E782 Mixed hyperlipidemia: Secondary | ICD-10-CM | POA: Diagnosis not present

## 2017-04-16 DIAGNOSIS — I4892 Unspecified atrial flutter: Secondary | ICD-10-CM | POA: Diagnosis not present

## 2017-04-16 DIAGNOSIS — IMO0001 Reserved for inherently not codable concepts without codable children: Secondary | ICD-10-CM

## 2017-04-16 DIAGNOSIS — I2510 Atherosclerotic heart disease of native coronary artery without angina pectoris: Secondary | ICD-10-CM | POA: Diagnosis not present

## 2017-04-16 DIAGNOSIS — K5909 Other constipation: Secondary | ICD-10-CM | POA: Diagnosis not present

## 2017-04-16 DIAGNOSIS — Z9989 Dependence on other enabling machines and devices: Secondary | ICD-10-CM

## 2017-04-16 DIAGNOSIS — I5030 Unspecified diastolic (congestive) heart failure: Secondary | ICD-10-CM

## 2017-04-16 DIAGNOSIS — E039 Hypothyroidism, unspecified: Secondary | ICD-10-CM | POA: Diagnosis not present

## 2017-04-16 DIAGNOSIS — N400 Enlarged prostate without lower urinary tract symptoms: Secondary | ICD-10-CM

## 2017-04-16 DIAGNOSIS — G4733 Obstructive sleep apnea (adult) (pediatric): Secondary | ICD-10-CM | POA: Diagnosis not present

## 2017-04-16 DIAGNOSIS — I10 Essential (primary) hypertension: Secondary | ICD-10-CM

## 2017-04-16 DIAGNOSIS — G8929 Other chronic pain: Secondary | ICD-10-CM

## 2017-04-16 LAB — COMPREHENSIVE METABOLIC PANEL
ALT: 25 U/L (ref 0–53)
AST: 23 U/L (ref 0–37)
Albumin: 3.7 g/dL (ref 3.5–5.2)
Alkaline Phosphatase: 59 U/L (ref 39–117)
BUN: 23 mg/dL (ref 6–23)
CALCIUM: 10.1 mg/dL (ref 8.4–10.5)
CHLORIDE: 104 meq/L (ref 96–112)
CO2: 27 meq/L (ref 19–32)
CREATININE: 1.29 mg/dL (ref 0.40–1.50)
GFR: 57.03 mL/min — ABNORMAL LOW (ref >60.00)
Glucose, Bld: 117 mg/dL — ABNORMAL HIGH (ref 70–99)
Potassium: 4 mEq/L (ref 3.5–5.1)
Sodium: 137 mEq/L (ref 135–145)
Total Bilirubin: 0.4 mg/dL (ref 0.2–1.2)
Total Protein: 7.4 g/dL (ref 6.0–8.3)

## 2017-04-16 LAB — CBC WITH DIFFERENTIAL/PLATELET
BASOS PCT: 2.3 % (ref 0.0–3.0)
Basophils Absolute: 0.2 10*3/uL — ABNORMAL HIGH (ref 0.0–0.1)
EOS PCT: 10.8 % — AB (ref 0.0–5.0)
Eosinophils Absolute: 0.9 10*3/uL — ABNORMAL HIGH (ref 0.0–0.7)
HCT: 42.6 % (ref 39.0–52.0)
Hemoglobin: 14.1 g/dL (ref 13.0–17.0)
LYMPHS PCT: 39.3 % (ref 12.0–46.0)
Lymphs Abs: 3.1 10*3/uL (ref 0.7–4.0)
MCHC: 33.2 g/dL (ref 30.0–36.0)
MCV: 95.8 fl (ref 78.0–100.0)
MONO ABS: 0.8 10*3/uL (ref 0.1–1.0)
MONOS PCT: 10.2 % (ref 3.0–12.0)
NEUTROS PCT: 37.4 % — AB (ref 43.0–77.0)
Neutro Abs: 3 10*3/uL (ref 1.4–7.7)
PLATELETS: 279 10*3/uL (ref 150.0–400.0)
RBC: 4.45 Mil/uL (ref 4.22–5.81)
RDW: 14.3 % (ref 11.5–15.5)
WBC: 8 10*3/uL (ref 4.0–10.5)

## 2017-04-16 LAB — TSH: TSH: 0.82 u[IU]/mL (ref 0.35–4.50)

## 2017-04-16 LAB — LDL CHOLESTEROL, DIRECT: LDL DIRECT: 105 mg/dL

## 2017-04-16 MED ORDER — LINACLOTIDE 145 MCG PO CAPS: 145.0000 ug | ORAL_CAPSULE | Freq: Every day | ORAL | 1 refills | Status: DC

## 2017-04-16 MED ORDER — FENOFIBRATE 160 MG PO TABS: 160.0000 mg | ORAL_TABLET | Freq: Every day | ORAL | 1 refills | Status: DC

## 2017-04-16 MED ORDER — EZETIMIBE 10 MG PO TABS: 10.0000 mg | ORAL_TABLET | Freq: Every day | ORAL | 1 refills | Status: DC

## 2017-04-16 MED ORDER — DIPHENHYDRAMINE-APAP (SLEEP) 25-500 MG PO TABS: 1.0000 | ORAL_TABLET | Freq: Every evening | ORAL | Status: DC | PRN

## 2017-04-16 MED ORDER — LEVOTHYROXINE SODIUM 200 MCG PO TABS: 200.0000 ug | ORAL_TABLET | Freq: Every day | ORAL | 3 refills | Status: DC

## 2017-04-16 NOTE — Patient Instructions (Addendum)
Good to meet you today, call us with questions. Continue current medicines.  Labs today including thyroid levels. Return as needed or in 2-3 months for medicare wellness visit with [name] and follow up with me.

## 2017-04-16 NOTE — Progress Notes (Signed)
BP 138/74   Pulse 89   Temp 97.7 F (36.5 C) (Oral)   Ht 6' 0.5" (1.842 m)   Wt 287 lb (130.2 kg)   SpO2 94%   BMI 38.39 kg/m    CC: new pt to establish care Subjective:    Patient ID: Anthony [name], male    DOB: 1935/10/11, 81 y.o.   MRN: [id number]  HPI: Anthony [name] is a 81 y.o. male presenting on 04/16/2017 for Establish Care   Here with son Dr Juanetta Beets today (FP at [facility]).  Would like refills of fenofibrate and linzess.   Hospitalized 01/2017 with dyspnea, found to be in acute dCHF then saw cards and dx with new atrial flutter [facility]). On eliquis 5mg  bid. Has appt next month with Dr Rockey Situ. Checks weight regularly at home.  HLD - did not tolerate statins - crestor and lipitor caused myalgias. Now managed with zetia and fenofibrate without myalgias or arthralgias.   Chronic lower back pain. Has electrical nerve stimulation St Jude implant in place. Upcoming appt with pain management Dr Holley Raring. Takes advil PM 2 at night.   Chronic insomnia for last 7-8 yrs. Has seen sleep doctor s/p sleep study. Also has restless legs.   OSA - compliant with CPAP.   Denies recent dyspnea, orthopnea, palpitations, chest pain. No abd pain, fevers/chills. No thyroid symptoms other than known constipation.   Widower. Lives alone, GF Miss [name] sometimes (she was nurse) Son is [name] (FP at [facility]) Occ: retired, used to [employer] store  Relevant past medical, surgical, family and social history reviewed and updated as indicated. Interim medical history since our last visit reviewed. Allergies and medications reviewed and updated. Outpatient Medications Prior to Visit  Medication Sig Dispense Refill  . apixaban (ELIQUIS) 5 MG TABS tablet Take 5 mg by mouth 2 (two) times daily.    Marland Kitchen aspirin 81 MG EC tablet Take 81 mg by mouth daily. Swallow whole.    . docusate sodium (COLACE) 100 MG capsule Take 100 mg by mouth 2 (two)  times daily.    . finasteride (PROSCAR) 5 MG tablet Take 5 mg by mouth daily.    Marland Kitchen HYDROcodone-acetaminophen (NORCO/VICODIN) 5-325 MG tablet Take 1 tablet by mouth every 6 (six) hours as needed for moderate pain.    Marland Kitchen ketoconazole (NIZORAL) 2 % cream Apply 1 application topically daily.    Marland Kitchen lisinopril (PRINIVIL,ZESTRIL) 2.5 MG tablet Take 2.5 mg by mouth daily.    . montelukast (SINGULAIR) 10 MG tablet Take 10 mg by mouth at bedtime.    . Multiple Vitamin (MULTIVITAMIN) capsule Take 1 capsule by mouth daily.    . multivitamin-lutein (OCUVITE-LUTEIN) CAPS capsule Take 1 capsule by mouth daily.    . nitroGLYCERIN (NITROSTAT) 0.4 MG SL tablet Place 0.4 mg under the tongue every 5 (five) minutes as needed for chest pain.    Marland Kitchen omega-3 acid ethyl esters (LOVAZA) 1 g capsule Take 2 g by mouth 2 (two) times daily.    . potassium chloride (K-DUR,KLOR-CON) 10 MEQ tablet Take 10 mEq by mouth 2 (two) times daily.    Marland Kitchen tiZANidine (ZANAFLEX) 2 MG tablet Take 2 mg by mouth as needed.    . ezetimibe (ZETIA) 10 MG tablet Take 10 mg by mouth daily.    . fenofibrate 160 MG tablet Take 160 mg by mouth daily.    . furosemide (LASIX) 40 MG tablet Take 40 mg by mouth.    Marland Kitchen  Ibuprofen-Diphenhydramine Cit (ADVIL PM) 200-38 MG TABS Take 1 tablet by mouth at bedtime as needed.    Marland Kitchen levothyroxine (SYNTHROID, LEVOTHROID) 200 MCG tablet Take 200 mcg by mouth daily before breakfast.    . linaclotide (LINZESS) 72 MCG capsule Take 72 mcg by mouth daily. Take on an empty stomach at least 30 minutes before 1st meal of the day    . metoprolol succinate (TOPROL-XL) 50 MG 24 hr tablet Take 50 mg by mouth 2 (two) times daily.     . polyethylene glycol (MIRALAX / GLYCOLAX) packet Take 17 g by mouth daily. Mix with water, juice, coffee or tea    . tamsulosin (FLOMAX) 0.4 MG CAPS capsule Take 0.4 mg by mouth daily.    . Eszopiclone 3 MG TABS Take 1 mg by mouth at bedtime as needed. Take immediately before bedtime     No  facility-administered medications prior to visit.      Per HPI unless specifically indicated in ROS section below Review of Systems     Objective:    BP 138/74   Pulse 89   Temp 97.7 F (36.5 C) (Oral)   Ht 6' 0.5" (1.842 m)   Wt 287 lb (130.2 kg)   SpO2 94%   BMI 38.39 kg/m   Wt Readings from Last 3 Encounters:  04/16/17 287 lb (130.2 kg)    Physical Exam  Constitutional: He appears well-developed and well-nourished. No distress.  HENT:  Head: Normocephalic and atraumatic.  Mouth/Throat: Oropharynx is clear and moist. No oropharyngeal exudate.  Eyes: Pupils are equal, round, and reactive to light. Conjunctivae and EOM are normal. No scleral icterus.  Neck: Normal range of motion. Neck supple.  Cardiovascular: Normal rate, normal heart sounds and intact distal pulses.  A regularly irregular rhythm present.  No murmur heard. Pulmonary/Chest: Effort normal and breath sounds normal. No respiratory distress. He has no wheezes. He has no rales.  Musculoskeletal: He exhibits no edema.  Skin: Skin is warm and dry. No rash noted.  Psychiatric: He has a normal mood and affect.  Nursing note and vitals reviewed.  Results for orders placed or performed in visit on 04/16/17  Comprehensive metabolic panel  Result Value Ref Range   Sodium 137 135 - 145 mEq/L   Potassium 4.0 3.5 - 5.1 mEq/L   Chloride 104 96 - 112 mEq/L   CO2 27 19 - 32 mEq/L   Glucose, Bld 117 (H) 70 - 99 mg/dL   BUN 23 6 - 23 mg/dL   Creatinine, Ser 1.29 0.40 - 1.50 mg/dL   Total Bilirubin 0.4 0.2 - 1.2 mg/dL   Alkaline Phosphatase 59 39 - 117 U/L   AST 23 0 - 37 U/L   ALT 25 0 - 53 U/L   Total Protein 7.4 6.0 - 8.3 g/dL   Albumin 3.7 3.5 - 5.2 g/dL   Calcium 10.1 8.4 - 10.5 mg/dL   GFR 57.03 (L) >60.00 mL/min  TSH  Result Value Ref Range   TSH 0.82 0.35 - 4.50 uIU/mL  CBC with Differential/Platelet  Result Value Ref Range   WBC 8.0 4.0 - 10.5 K/uL   RBC 4.45 4.22 - 5.81 Mil/uL   Hemoglobin 14.1 13.0 -  17.0 g/dL   HCT 42.6 39.0 - 52.0 %   MCV 95.8 78.0 - 100.0 fl   MCHC 33.2 30.0 - 36.0 g/dL   RDW 14.3 11.5 - 15.5 %   Platelets 279.0 150.0 - 400.0 K/uL   Neutrophils Relative % 37.4 (  L) 43.0 - 77.0 %   Lymphocytes Relative 39.3 12.0 - 46.0 %   Monocytes Relative 10.2 3.0 - 12.0 %   Eosinophils Relative 10.8 (H) 0.0 - 5.0 %   Basophils Relative 2.3 0.0 - 3.0 %   Neutro Abs 3.0 1.4 - 7.7 K/uL   Lymphs Abs 3.1 0.7 - 4.0 K/uL   Monocytes Absolute 0.8 0.1 - 1.0 K/uL   Eosinophils Absolute 0.9 (H) 0.0 - 0.7 K/uL   Basophils Absolute 0.2 (H) 0.0 - 0.1 K/uL  LDL Cholesterol, Direct  Result Value Ref Range   Direct LDL 105.0 mg/dL      Assessment & Plan:   Problem List Items Addressed This Visit    (HFpEF) heart failure with preserved ejection fraction (HCC)    Seems euvolemic. Continue current regimen of lasix and potassium daily.       Relevant Medications   fenofibrate 160 MG tablet   ezetimibe (ZETIA) 10 MG tablet   furosemide (LASIX) 40 MG tablet   metoprolol succinate (TOPROL-XL) 50 MG 24 hr tablet   Atrial flutter (HCC)    Stable on current regimen including beta blocker. Has upcoming appt to establish with cards      Relevant Medications   fenofibrate 160 MG tablet   ezetimibe (ZETIA) 10 MG tablet   furosemide (LASIX) 40 MG tablet   metoprolol succinate (TOPROL-XL) 50 MG 24 hr tablet   Other Relevant Orders   Comprehensive metabolic panel (Completed)   CBC with Differential/Platelet (Completed)   BPH (benign prostatic hyperplasia)    He states he is taking flomax 0.8mg  nightly and denies significant LUTS.       Relevant Medications   tamsulosin (FLOMAX) 0.4 MG CAPS capsule   CAD (coronary artery disease)    S/p stent      Relevant Medications   fenofibrate 160 MG tablet   ezetimibe (ZETIA) 10 MG tablet   furosemide (LASIX) 40 MG tablet   metoprolol succinate (TOPROL-XL) 50 MG 24 hr tablet   Chronic constipation    Does well with colace, linzess. Refilled.         Relevant Medications   polyethylene glycol (MIRALAX / GLYCOLAX) packet   Chronic insomnia    He finds advil PM beneficial. rec tylenol PM instead. He also takes [place] or sonata PRN - has previously seen sleep MD.       Chronic lower back pain    He has spine stimulator placement. Chronic pain is treated with hydrocodone and zanaflex. He was taking advil PM. Given he is on eliquis and aspirin, recommended switch aspirin PM to tylenol PM. He also states he is planning on establishing with pain management.      Hypercholesterolemia with hypertriglyceridemia    Chronic and stable on zetia and fenofibrate. Refills completed. H/o statin intolerance (rosuvastatin, atorvastatin) as well as cholestyramine and gemfibrozil. Seems to be tolerating current regimen well.       Relevant Medications   fenofibrate 160 MG tablet   ezetimibe (ZETIA) 10 MG tablet   furosemide (LASIX) 40 MG tablet   metoprolol succinate (TOPROL-XL) 50 MG 24 hr tablet   Other Relevant Orders   LDL Cholesterol, Direct (Completed)   Hypertension    Chronic, stable. Continue current regimen.       Relevant Medications   fenofibrate 160 MG tablet   ezetimibe (ZETIA) 10 MG tablet   furosemide (LASIX) 40 MG tablet   metoprolol succinate (TOPROL-XL) 50 MG 24 hr tablet   Hypothyroidism - Primary  Compliant with levothyroxine - update TSH today.       Relevant Medications   levothyroxine (SYNTHROID, LEVOTHROID) 200 MCG tablet   metoprolol succinate (TOPROL-XL) 50 MG 24 hr tablet   Other Relevant Orders   TSH (Completed)   Obesity, Class II, BMI 35-39.9, with comorbidity   OSA on CPAP    Compliant with CPAP - continue.           Follow up plan: Return in about 3 months (around 07/17/2017) for medicare wellness visit.  Ria Bush, MD

## 2017-04-18 ENCOUNTER — Encounter: Payer: Self-pay | Admitting: Family Medicine

## 2017-04-18 DIAGNOSIS — M545 Low back pain: Secondary | ICD-10-CM

## 2017-04-18 DIAGNOSIS — E039 Hypothyroidism, unspecified: Secondary | ICD-10-CM | POA: Insufficient documentation

## 2017-04-18 DIAGNOSIS — I2510 Atherosclerotic heart disease of native coronary artery without angina pectoris: Secondary | ICD-10-CM | POA: Insufficient documentation

## 2017-04-18 DIAGNOSIS — E669 Obesity, unspecified: Secondary | ICD-10-CM | POA: Insufficient documentation

## 2017-04-18 DIAGNOSIS — G8929 Other chronic pain: Secondary | ICD-10-CM | POA: Insufficient documentation

## 2017-04-18 DIAGNOSIS — I10 Essential (primary) hypertension: Secondary | ICD-10-CM | POA: Insufficient documentation

## 2017-04-18 DIAGNOSIS — I5030 Unspecified diastolic (congestive) heart failure: Secondary | ICD-10-CM

## 2017-04-18 DIAGNOSIS — F5104 Psychophysiologic insomnia: Secondary | ICD-10-CM | POA: Insufficient documentation

## 2017-04-18 NOTE — Assessment & Plan Note (Signed)
Does well with colace, linzess. Refilled.

## 2017-04-18 NOTE — Assessment & Plan Note (Signed)
S/p stent. Continue surveillance. 

## 2017-04-18 NOTE — Assessment & Plan Note (Addendum)
He finds advil PM beneficial. rec tylenol PM instead. He also takes [place] or sonata PRN - has previously seen sleep MD.

## 2017-04-18 NOTE — Assessment & Plan Note (Addendum)
He has spine stimulator placement. Chronic pain is treated with hydrocodone and zanaflex. He was taking advil PM. Given he is on eliquis and aspirin, recommended switch aspirin PM to tylenol PM. He also states he is planning on establishing with pain management.

## 2017-04-18 NOTE — Assessment & Plan Note (Deleted)
Chronic and stable on zetia and fenofibrate. Refills completed. H/o statin intolerance (rosuvastatin, atorvastatin) as well as cholestyramine and gemfibrozil. Seems to be tolerating current regimen well.

## 2017-04-18 NOTE — Assessment & Plan Note (Signed)
Stable on current regimen including beta blocker. Has upcoming appt to establish with cards

## 2017-04-18 NOTE — Assessment & Plan Note (Signed)
Chronic, stable. Continue current regimen. 

## 2017-04-18 NOTE — Assessment & Plan Note (Signed)
Compliant with CPAP - continue. 

## 2017-04-18 NOTE — Assessment & Plan Note (Signed)
Chronic and stable on zetia and fenofibrate. Refills completed. H/o statin intolerance (rosuvastatin, atorvastatin) as well as cholestyramine and gemfibrozil. Seems to be tolerating current regimen well.

## 2017-04-18 NOTE — Assessment & Plan Note (Signed)
Seems euvolemic. Continue current regimen of lasix and potassium daily.

## 2017-04-18 NOTE — Assessment & Plan Note (Signed)
Compliant with levothyroxine - update TSH today.

## 2017-04-18 NOTE — Assessment & Plan Note (Signed)
He states he is taking flomax 0.8mg  nightly and denies significant LUTS.

## 2017-04-24 ENCOUNTER — Encounter: Payer: Self-pay | Admitting: Student in an Organized Health Care Education/Training Program

## 2017-04-24 ENCOUNTER — Ambulatory Visit
Payer: Medicare Other | Attending: Student in an Organized Health Care Education/Training Program | Admitting: Student in an Organized Health Care Education/Training Program

## 2017-04-24 VITALS — Ht 73.0 in | Wt 282.0 lb

## 2017-04-24 DIAGNOSIS — M4698 Unspecified inflammatory spondylopathy, sacral and sacrococcygeal region: Secondary | ICD-10-CM

## 2017-04-24 DIAGNOSIS — G4733 Obstructive sleep apnea (adult) (pediatric): Secondary | ICD-10-CM | POA: Insufficient documentation

## 2017-04-24 DIAGNOSIS — K5909 Other constipation: Secondary | ICD-10-CM | POA: Diagnosis not present

## 2017-04-24 DIAGNOSIS — Z6839 Body mass index (BMI) 39.0-39.9, adult: Secondary | ICD-10-CM | POA: Diagnosis not present

## 2017-04-24 DIAGNOSIS — I4892 Unspecified atrial flutter: Secondary | ICD-10-CM | POA: Insufficient documentation

## 2017-04-24 DIAGNOSIS — Z955 Presence of coronary angioplasty implant and graft: Secondary | ICD-10-CM | POA: Insufficient documentation

## 2017-04-24 DIAGNOSIS — N400 Enlarged prostate without lower urinary tract symptoms: Secondary | ICD-10-CM | POA: Insufficient documentation

## 2017-04-24 DIAGNOSIS — Z96653 Presence of artificial knee joint, bilateral: Secondary | ICD-10-CM | POA: Diagnosis not present

## 2017-04-24 DIAGNOSIS — E669 Obesity, unspecified: Secondary | ICD-10-CM | POA: Diagnosis not present

## 2017-04-24 DIAGNOSIS — M545 Low back pain, unspecified: Secondary | ICD-10-CM

## 2017-04-24 DIAGNOSIS — M47816 Spondylosis without myelopathy or radiculopathy, lumbar region: Secondary | ICD-10-CM | POA: Diagnosis not present

## 2017-04-24 DIAGNOSIS — Z85828 Personal history of other malignant neoplasm of skin: Secondary | ICD-10-CM | POA: Insufficient documentation

## 2017-04-24 DIAGNOSIS — I2510 Atherosclerotic heart disease of native coronary artery without angina pectoris: Secondary | ICD-10-CM | POA: Diagnosis not present

## 2017-04-24 DIAGNOSIS — Z7901 Long term (current) use of anticoagulants: Secondary | ICD-10-CM | POA: Diagnosis not present

## 2017-04-24 DIAGNOSIS — M4696 Unspecified inflammatory spondylopathy, lumbar region: Secondary | ICD-10-CM | POA: Diagnosis not present

## 2017-04-24 DIAGNOSIS — M5136 Other intervertebral disc degeneration, lumbar region: Secondary | ICD-10-CM | POA: Diagnosis not present

## 2017-04-24 DIAGNOSIS — M461 Sacroiliitis, not elsewhere classified: Secondary | ICD-10-CM | POA: Insufficient documentation

## 2017-04-24 DIAGNOSIS — E039 Hypothyroidism, unspecified: Secondary | ICD-10-CM | POA: Insufficient documentation

## 2017-04-24 DIAGNOSIS — E7800 Pure hypercholesterolemia, unspecified: Secondary | ICD-10-CM | POA: Diagnosis not present

## 2017-04-24 DIAGNOSIS — Z7982 Long term (current) use of aspirin: Secondary | ICD-10-CM | POA: Diagnosis not present

## 2017-04-24 DIAGNOSIS — F329 Major depressive disorder, single episode, unspecified: Secondary | ICD-10-CM | POA: Diagnosis not present

## 2017-04-24 DIAGNOSIS — Z9689 Presence of other specified functional implants: Secondary | ICD-10-CM | POA: Diagnosis not present

## 2017-04-24 DIAGNOSIS — G8929 Other chronic pain: Secondary | ICD-10-CM | POA: Diagnosis not present

## 2017-04-24 DIAGNOSIS — M47818 Spondylosis without myelopathy or radiculopathy, sacral and sacrococcygeal region: Secondary | ICD-10-CM

## 2017-04-24 DIAGNOSIS — I10 Essential (primary) hypertension: Secondary | ICD-10-CM | POA: Insufficient documentation

## 2017-04-24 DIAGNOSIS — E785 Hyperlipidemia, unspecified: Secondary | ICD-10-CM | POA: Insufficient documentation

## 2017-04-24 DIAGNOSIS — I429 Cardiomyopathy, unspecified: Secondary | ICD-10-CM | POA: Insufficient documentation

## 2017-04-24 DIAGNOSIS — Z87442 Personal history of urinary calculi: Secondary | ICD-10-CM | POA: Diagnosis not present

## 2017-04-24 MED ORDER — TIZANIDINE HCL 4 MG PO CAPS: 4.0000 mg | ORAL_CAPSULE | Freq: Two times a day (BID) | ORAL | 0 refills | Status: DC | PRN

## 2017-04-24 NOTE — Patient Instructions (Signed)
It was nice meeting you today. Today we discussed the following:  #1. Schedule you for diagnostic left L3, L4, L5 lumbar medial branch nerve block. If this is effective for your low back and left posterior thigh pain, we will proceed with radiofrequency ablation.  Please stop your Eliquis (apixaban),  On Friday, August 31 for your procedure on Wednesday. You can restart this medication 24 hours after the procedure.  #2. Prescription for tizanidine 4 mg twice a day when necessary muscle spasm  #3. Urine Drug screen today

## 2017-04-24 NOTE — Progress Notes (Signed)
Patient's Name: Anthony [name]  MRN: [id number]  Referring Provider: Birdie Sons, MD  DOB: 1936-08-13  PCP: Ria Bush, MD  DOS: 04/24/2017  Note by: Gillis Santa, MD  Service setting: Ambulatory outpatient  Specialty: Interventional Pain Management  Location: [facility] (AMB) Pain Management Facility  Visit type: Initial Patient Evaluation  Patient type: New Patient   Primary Reason(s) for Visit: Encounter for initial evaluation of one or more chronic problems (new to examiner) potentially causing chronic pain, and posing a threat to normal musculoskeletal function. (Level of risk: High) CC: Back Pain (low)  HPI  [name] is a 81 y.o. year old, male patient, who comes today to see [name] for the first time for an initial evaluation of his chronic pain. He has History of basal cell carcinoma (BCC); History of squamous cell carcinoma in situ of skin; Hypercholesterolemia with hypertriglyceridemia; Chronic constipation; OSA on CPAP; BPH (benign prostatic hyperplasia); Atrial flutter ([name]); Chronic insomnia; (HFpEF) heart failure with preserved ejection fraction ([name]); Hypertension; Hypothyroidism; Obesity, Class II, BMI 35-39.9, with comorbidity; CAD (coronary artery disease); and Chronic lower back pain on his problem list. Today he comes in for evaluation of his Back Pain (low)  Pain Assessment: Location: Lower, Left, Right Back Radiating: radiates down left leg in the back to mid thigh Onset: More than a month ago Duration: Chronic pain Quality: Aching Severity: 3 /10 (self-reported pain score)  Note: Reported level is compatible with observation.                   Effect on ADL:   Timing: Constant Modifying factors: medications  Onset and Duration: Present longer than 3 months Cause of pain: Unknown Severity: Getting worse, NAS-11 at its worse: 8/10, NAS-11 at its best: 1/10, NAS-11 now: 1/10 and NAS-11 on the average: 5/10 Timing: Night, During activity or exercise and After activity  or exercise Aggravating Factors: Kneeling, Lifiting, Prolonged standing, Squatting, Walking and Working Alleviating Factors: Sitting Associated Problems: Constipation, Fatigue, Impotence, Pain that wakes patient up and Pain that does not allow patient to sleep Quality of Pain: Annoying, Disabling, Dull, Exhausting, Nagging and Uncomfortable Previous Examinations or Tests: CT scan, MRI scan, Nerve block and X-rays Previous Treatments: Epidural steroid injections, Radiofrequency and Spinal cord stimulator  The patient comes into the clinics today for the first time for a chronic pain management evaluation.   Patient is a very pleasant 81 year old gentleman with multiple medical problems including obstructive sleep apnea on CPAP, atrial flutter, diastolic heart failure, hypertension, coronary artery disease anticoagulated with apixaban 5 mg who has recently moved from [place], looking to establish pain management care locally.  Patient's primary pain generator is his axial low back with radiation to his left posterior thigh that does not extend beyond his knee. At the patient's pain clinic in [place], he was on hydrocodone 5 mg twice a day to 3 times a day when necessary. Patient also had lumbar interlaminar steroid injections along with left L5 transforaminal ESI which provided him with moderate pain relief. This pain relief was not long lasting. The patient went on to have a spinal cord stimulator trial and implant.   Patient's spinal cord stimulator is St. Jude, model number is [id number], implant date 07/18/2016. Thoracic electrode placement was at T8 bilaterally. Patient states that he uses the [facility] program occasionally but prefers tonic, paresthesia base stimulation for his back pain.  Today I took the time to provide the patient with information regarding my pain practice. The patient was  informed that my practice is divided into two sections: an interventional pain management section, as well as a  completely separate and distinct medication management section. I explained that I have procedure days for my interventional therapies, and evaluation days for follow-ups and medication management. Because of the amount of documentation required during both, they are kept separated. This means that there is the possibility that he may be scheduled for a procedure on one day, and medication management the next. I have also informed him that because of staffing and facility limitations, I no longer take patients for medication management only. To illustrate the reasons for this, I gave the patient the example of surgeons, and how inappropriate it would be to refer a patient to his/her care, just to write for the post-surgical antibiotics on a surgery done by a different surgeon.   Because interventional pain management is my board-certified specialty, the patient was informed that joining my practice means that they are open to any and all interventional therapies. I made it clear that this does not mean that they will be forced to have any procedures done. What this means is that I believe interventional therapies to be essential part of the diagnosis and proper management of chronic pain conditions. Therefore, patients not interested in these interventional alternatives will be better served under the care of a different practitioner.  The patient was also made aware of my Comprehensive Pain Management Safety Guidelines where by joining my practice, they limit all of their nerve blocks and joint injections to those done by our practice, for as long as we are retained to manage their care.   Historic Controlled Substance Pharmacotherapy Review  PMP and historical list of controlled substances: Norco 5 mg 3 times a day when necessary, quantity 90, filled in [place]. Patient brought pain medical records which confirms filling this prescription. Highest opioid analgesic regimen found: As above  Most recent opioid  analgesic: As above  Current opioid analgesics: As above  MME/day: 10-15 mg/day Medications: The patient did not bring the medication(s) to the appointment, as requested in our "New Patient Package" Pharmacodynamics: Desired effects: Analgesia: The patient reports >50% benefit. Reported improvement in function: The patient reports medication allows him to accomplish basic ADLs. Clinically meaningful improvement in function (CMIF): Sustained CMIF goals met Perceived effectiveness: Described as relatively effective, allowing for increase in activities of daily living (ADL) Undesirable effects: Side-effects or Adverse reactions: None reported Historical Monitoring: The patient  reports that he does not use drugs. List of all UDS Test(s): No results found for: MDMA, COCAINSCRNUR, PCPSCRNUR, PCPQUANT, CANNABQUANT, THCU, [place] List of all Serum Drug Screening Test(s):  No results found for: AMPHSCRSER, BARBSCRSER, BENZOSCRSER, COCAINSCRSER, PCPSCRSER, PCPQUANT, THCSCRSER, CANNABQUANT, OPIATESCRSER, OXYSCRSER, PROPOXSCRSER Historical Background Evaluation: [place] PDMP: Six (6) year initial data search conducted.             [place] Department of public safety, offender search: Editor, commissioning Information) Non-contributory Risk Assessment Profile: Aberrant behavior: None observed or detected today Risk factors for fatal opioid overdose: None identified today Fatal overdose hazard ratio (HR): Calculation deferred Non-fatal overdose hazard ratio (HR): Calculation deferred Risk of opioid abuse or dependence: 0.7-3.0% with doses ? 36 MME/day and 6.1-26% with doses ? 120 MME/day. Substance use disorder (SUD) risk level: Low Opioid risk tool (ORT) (Total Score): 0     Opioid Risk Tool - 04/24/17 1402      Family History of Substance Abuse   Alcohol Negative   Illegal Drugs Negative  Rx Drugs Negative     Personal History of Substance Abuse   Alcohol Negative   Illegal Drugs Negative   Rx Drugs Negative      Age   Age between 15-45 years  No     History of Preadolescent Sexual Abuse   History of Preadolescent Sexual Abuse Negative or Male     Psychological Disease   Psychological Disease Negative   Depression Negative     Total Score   Opioid Risk Tool Scoring 0   Opioid Risk Interpretation Low Risk     ORT Scoring interpretation table:  Score <3 = Low Risk for SUD  Score between 4-7 = Moderate Risk for SUD  Score >8 = High Risk for Opioid Abuse     Pharmacologic Plan: Continue therapy as is            Initial impression: No immediate contraindications found.  Meds   Current Meds  Medication Sig  . apixaban (ELIQUIS) 5 MG TABS tablet Take 5 mg by mouth 2 (two) times daily.  Marland Kitchen aspirin 81 MG EC tablet Take 81 mg by mouth daily. Swallow whole.  . diphenhydramine-acetaminophen (TYLENOL PM EXTRA STRENGTH) 25-500 MG TABS tablet Take 1 tablet by mouth at bedtime as needed.  . docusate sodium (COLACE) 100 MG capsule Take 100 mg by mouth 2 (two) times daily.  . Eszopiclone 3 MG TABS Take 1 mg by mouth at bedtime as needed. Take immediately before bedtime  . ezetimibe (ZETIA) 10 MG tablet Take 1 tablet (10 mg total) by mouth daily.  . fenofibrate 160 MG tablet Take 1 tablet (160 mg total) by mouth daily.  . finasteride (PROSCAR) 5 MG tablet Take 5 mg by mouth daily.  . furosemide (LASIX) 40 MG tablet Take 1 tablet (40 mg total) by mouth daily. Take extra tablet as needed for weight gain >3lb/day or 5lb/wk  . HYDROcodone-acetaminophen (NORCO/VICODIN) 5-325 MG tablet Take 1 tablet by mouth every 6 (six) hours as needed for moderate pain.  Marland Kitchen levothyroxine (SYNTHROID, LEVOTHROID) 200 MCG tablet Take 1 tablet (200 mcg total) by mouth daily before breakfast.  . lisinopril (PRINIVIL,ZESTRIL) 2.5 MG tablet Take 2.5 mg by mouth daily.  . metoprolol succinate (TOPROL-XL) 50 MG 24 hr tablet Take 1 tablet (50 mg total) by mouth 2 (two) times daily.  . montelukast (SINGULAIR) 10 MG tablet Take 10  mg by mouth at bedtime.  . Multiple Vitamin (MULTIVITAMIN) capsule Take 1 capsule by mouth daily.  . multivitamin-lutein (OCUVITE-LUTEIN) CAPS capsule Take 1 capsule by mouth daily.  . nitroGLYCERIN (NITROSTAT) 0.4 MG SL tablet Place 0.4 mg under the tongue every 5 (five) minutes as needed for chest pain.  Marland Kitchen omega-3 acid ethyl esters (LOVAZA) 1 g capsule Take 2 g by mouth 2 (two) times daily.  . potassium chloride (K-DUR,KLOR-CON) 10 MEQ tablet Take 10 mEq by mouth 2 (two) times daily.  . tamsulosin (FLOMAX) 0.4 MG CAPS capsule Take 2 capsules (0.8 mg total) by mouth daily after supper.  . zaleplon (SONATA) 10 MG capsule Take 10 mg by mouth at bedtime as needed for sleep.  . [DISCONTINUED] tiZANidine (ZANAFLEX) 2 MG tablet Take 2 mg by mouth as needed.      ROS  Cardiovascular History: Abnormal heart rhythm, Daily Aspirin intake, High blood pressure, Heart failure, Weak heart (CHF), Blood thinners:  Anticoagulant and Needs antibiotics prior to dental procedures Pulmonary or Respiratory History: Temporary stoppage of breathing during sleep Neurological History: No reported neurological signs or symptoms  such as seizures, abnormal skin sensations, urinary and/or fecal incontinence, being born with an abnormal open spine and/or a tethered spinal cord Review of Past Neurological Studies: No results found for this or any previous visit. Psychological-Psychiatric History: Difficulty sleeping and or falling asleep Gastrointestinal History: Irregular, infrequent bowel movements (Constipation) Genitourinary History: No reported renal or genitourinary signs or symptoms such as difficulty voiding or producing urine, peeing blood, non-functioning kidney, kidney stones, difficulty emptying the bladder, difficulty controlling the flow of urine, or chronic kidney disease Hematological History: No reported hematological signs or symptoms such as prolonged bleeding, low or poor functioning platelets, bruising or  bleeding easily, hereditary bleeding problems, low energy levels due to low hemoglobin or being anemic Endocrine History: Slow thyroid Rheumatologic History: No reported rheumatological signs and symptoms such as fatigue, joint pain, tenderness, swelling, redness, heat, stiffness, decreased range of motion, with or without associated rash Musculoskeletal History: Negative for myasthenia gravis, muscular dystrophy, multiple sclerosis or malignant hyperthermia Work History: Retired  Allergies  Mr. Perrow is allergic to atorvastatin; gemfibrozil; metformin and related; questran [cholestyramine]; and rosuvastatin.  Laboratory Chemistry  Inflammation Markers (CRP: Acute Phase) (ESR: Chronic Phase) No results found for: CRP, ESRSEDRATE               Renal Function Markers Lab Results  Component Value Date   BUN 23 04/16/2017   CREATININE 1.29 04/16/2017                 Hepatic Function Markers Lab Results  Component Value Date   AST 23 04/16/2017   ALT 25 04/16/2017   ALBUMIN 3.7 04/16/2017   ALKPHOS 59 04/16/2017                 Electrolytes Lab Results  Component Value Date   NA 137 04/16/2017   K 4.0 04/16/2017   CL 104 04/16/2017   CALCIUM 10.1 04/16/2017                 Neuropathy Markers No results found for: [id number]               Bone Pathology Markers Lab Results  Component Value Date   ALKPHOS 59 04/16/2017   CALCIUM 10.1 04/16/2017                 Coagulation Parameters Lab Results  Component Value Date   PLT 279.0 04/16/2017                 Cardiovascular Markers Lab Results  Component Value Date   HGB 14.1 04/16/2017   HCT 42.6 04/16/2017                 Note: Lab results reviewed.  PFSH  Drug: Mr. Andujo  reports that he does not use drugs. Alcohol:  reports that he does not drink alcohol. Tobacco:  reports that he has never smoked. He has never used smokeless tobacco. Medical:  has a past medical history of Anosmia (1980s); Atrial flutter  ([place]); CAD (coronary artery disease); Chronic constipation; Chronic insomnia; Chronic lower back pain; Diastolic CHF ([place]); History of diverticulitis; Hyperlipidemia; Hypertension; Hypothyroidism; Obesity, Class II, BMI 35-39.9, with comorbidity; and OSA on CPAP. Family: family history includes Kidney disease in his father; Leukemia in his brother; Stroke in his mother and sister.  Past Surgical History:  Procedure Laterality Date  . NASAL SINUS SURGERY     nasal polyps  . PERCUTANEOUS CORONARY STENT INTERVENTION (PCI-S)     stent placement  .  REPLACEMENT TOTAL KNEE BILATERAL Bilateral 2000s  . SPINAL CORD STIMULATOR IMPLANT  06/2016   Active Ambulatory Problems    Diagnosis Date Noted  . History of basal cell carcinoma (BCC) 04/12/2017  . History of squamous cell carcinoma in situ of skin 04/12/2017  . Hypercholesterolemia with hypertriglyceridemia 04/12/2017  . Chronic constipation 04/12/2017  . OSA on CPAP 04/12/2017  . BPH (benign prostatic hyperplasia) 04/12/2017  . Atrial flutter ([place]) 04/15/2017  . Chronic insomnia   . (HFpEF) heart failure with preserved ejection fraction ([place])   . Hypertension   . Hypothyroidism   . Obesity, Class II, BMI 35-39.9, with comorbidity   . CAD (coronary artery disease)   . Chronic lower back pain    Resolved Ambulatory Problems    Diagnosis Date Noted  . No Resolved Ambulatory Problems   Past Medical History:  Diagnosis Date  . Anosmia 1980s  . Atrial flutter ([place])   . CAD (coronary artery disease)   . Chronic constipation   . Chronic insomnia   . Chronic lower back pain   . Diastolic CHF ([place])   . History of diverticulitis   . Hyperlipidemia   . Hypertension   . Hypothyroidism   . Obesity, Class II, BMI 35-39.9, with comorbidity   . OSA on CPAP    Constitutional Exam  General appearance: Well nourished, well developed, and well hydrated. In no apparent acute distress Vitals:   04/24/17 1352  Weight: 282 lb (127.9 kg)   Height: 6' 1"  (1.854 m)   BMI Assessment: Estimated body mass index is 37.21 kg/m as calculated from the following:   Height as of this encounter: 6' 1"  (1.854 m).   Weight as of this encounter: 282 lb (127.9 kg).  BMI interpretation table: BMI level Category Range association with higher incidence of chronic pain  <18 kg/m2 Underweight   18.5-24.9 kg/m2 Ideal body weight   25-29.9 kg/m2 Overweight Increased incidence by 20%  30-34.9 kg/m2 Obese (Class I) Increased incidence by 68%  35-39.9 kg/m2 Severe obesity (Class II) Increased incidence by 136%  >40 kg/m2 Extreme obesity (Class III) Increased incidence by 254%   BMI Readings from Last 4 Encounters:  04/24/17 37.21 kg/m  04/16/17 38.39 kg/m   Wt Readings from Last 4 Encounters:  04/24/17 282 lb (127.9 kg)  04/16/17 287 lb (130.2 kg)  Psych/Mental status: Alert, oriented x 3 (person, place, & time)       Eyes: PERLA Respiratory: No evidence of acute respiratory distress  Cervical Spine Area Exam  Skin & Axial Inspection: No masses, redness, edema, swelling, or associated skin lesions Alignment: Symmetrical Functional ROM: Unrestricted ROM      Stability: No instability detected Muscle Tone/Strength: Functionally intact. No obvious neuro-muscular anomalies detected. Sensory (Neurological): Unimpaired Palpation: No palpable anomalies              Upper Extremity (UE) Exam    Side: Right upper extremity  Side: Left upper extremity  Skin & Extremity Inspection: Skin color, temperature, and hair growth are WNL. No peripheral edema or cyanosis. No masses, redness, swelling, asymmetry, or associated skin lesions. No contractures.  Skin & Extremity Inspection: Skin color, temperature, and hair growth are WNL. No peripheral edema or cyanosis. No masses, redness, swelling, asymmetry, or associated skin lesions. No contractures.  Functional ROM: Unrestricted ROM          Functional ROM: Unrestricted ROM          Muscle  Tone/Strength: Functionally intact. No obvious neuro-muscular anomalies  detected.  Muscle Tone/Strength: Functionally intact. No obvious neuro-muscular anomalies detected.  Sensory (Neurological): Unimpaired          Sensory (Neurological): Unimpaired          Palpation: No palpable anomalies              Palpation: No palpable anomalies              Specialized Test(s): Deferred         Specialized Test(s): Deferred          Thoracic Spine Area Exam  Skin & Axial Inspection: No masses, redness, or swelling Alignment: Symmetrical Functional ROM: Unrestricted ROM Stability: No instability detected Muscle Tone/Strength: Functionally intact. No obvious neuro-muscular anomalies detected. Sensory (Neurological): Unimpaired Muscle strength & Tone: No palpable anomalies  Lumbar Spine Area Exam  Skin & Axial Inspection: No masses, redness, or swelling Alignment: Symmetrical Functional ROM: Pain restricted ROM      Stability: No instability detected Muscle Tone/Strength: Functionally intact. No obvious neuro-muscular anomalies detected. Sensory (Neurological): Articular pain pattern Palpation: Complains of area being tender to palpation       Provocative Tests: Lumbar Hyperextension and rotation test: Positive      left greater than right Lumbar Lateral bending test: Positive      left greater than right Patrick's Maneuver: Positive on left                   Pain with radiation to left posterior thigh on lumbar extension Gait & Posture Assessment  Ambulation: Limited Gait: Relatively normal for age and body habitus somewhat antalgic Posture: WNL   Lower Extremity Exam    Side: Right lower extremity  Side: Left lower extremity  Skin & Extremity Inspection: Skin color, temperature, and hair growth are WNL. No peripheral edema or cyanosis. No masses, redness, swelling, asymmetry, or associated skin lesions. No contractures.  Skin & Extremity Inspection: Skin color, temperature, and hair growth  are WNL. No peripheral edema or cyanosis. No masses, redness, swelling, asymmetry, or associated skin lesions. No contractures.  Functional ROM: Unrestricted ROM          Functional ROM: Unrestricted ROM          Muscle Tone/Strength: Functionally intact. No obvious neuro-muscular anomalies detected.  Muscle Tone/Strength: Functionally intact. No obvious neuro-muscular anomalies detected.  Sensory (Neurological): Unimpaired  Sensory (Neurological): Unimpaired  Palpation: No palpable anomalies  Palpation: No palpable anomalies   Assessment  Primary Diagnosis & Pertinent Problem List: The primary encounter diagnosis was Spondylosis without myelopathy or radiculopathy, lumbar region. Diagnoses of Spinal cord stimulator status, Lumbar degenerative disc disease, Lumbar facet arthropathy ([place]), Chronic left-sided low back pain without sciatica, and SI joint arthritis ([place]) were also pertinent to this visit.  Visit Diagnosis (New problems to examiner): 1. Spondylosis without myelopathy or radiculopathy, lumbar region   2. Spinal cord stimulator status   3. Lumbar degenerative disc disease   4. Lumbar facet arthropathy ([place])   5. Chronic left-sided low back pain without sciatica   6. SI joint arthritis ([place])    Plan of Care (Initial workup plan)  81 year old male with an impressive cardiac history including atrial flutter and coronary artery disease, anticoagulated on Eliquis, presents with axial low back pain after having moved from [place] where he was an established patient at a pain clinic. Patient's chronic low back pain secondary to lumbar degenerative disc disease, lumbar spondylosis without myelopathy, lumbar facet arthropathy. Patient also has chronic lumbosacral radiculopathy for which she  has tried transforaminal and epidural steroid injections in the past which were not very effective. He is status post St. Jude's spinal cord stimulator, placed 07/18/2016, model 3662 at T8 bilaterally.  Patient  states that he feels moderate back pain relief with the spinal cord stimulator. He has 5 different programs in place. One of them is some burst which is up to her shoulder which she does not prefer. Patient does prefer paresthesia based stimulation for his low back.  Patient endorses acute on chronic left low back pain that radiates to his posterior thigh which is been refractory to a transforaminal epidural steroid injections at left L5-S1. The patient does have pain with lumbar extension and lateral rotation most pronounced on the left side. These provocative tests result in the same sort of pain exacerbation that he feels with certain movements. We discussed a left L3, L4, L5 lumbar medial branch block with goal radiofrequency ablation. All questions and concerns were addressed and patient wants to proceed. The patient's low back and left buttock pain could also be secondary to SI joint arthritis. I will explore this further if his low back pain does not improve with our diagnostic medial branch blocks.  In regards to medication management, I have reviewed the [place] [facility] [place] [facility] [place] [facility] [place]. There are no red flags or concerns from my end. Patient is tried gabapentin in the past and states it was not very effective. Can consider other neuropathics in the future including Lyrica, Topamax.  Plan: #1. Urine drug screen which is standard for all new patients. I expect this to be positive for hydrocodone and its metabolites. Will take over Norco Rx at next visit pending UDS. #2. Left L3, L4, L5 medial branch nerve block with goal radiofrequency ablation. Patient instructed to stop Eliquis 5 days prior to his scheduled procedure. Patient states that he has stopped this medication in the past for his other pain procedures. #3. Prescription for tizanidine at increased dose of 4 mg twice a day when necessary #4. Follow-up for next available left lumbar L3, L4, L5  Ordered  Lab-work, Procedure(s), Referral(s), & Consult(s): Orders Placed This Encounter  Procedures  . LUMBAR FACET(MEDIAL BRANCH NERVE BLOCK) MBNB  . Compliance Drug Analysis, Ur   Pharmacotherapy (current): Medications ordered:  Meds ordered this encounter  Medications  . tiZANidine (ZANAFLEX) 4 MG capsule    Sig: Take 1 capsule (4 mg total) by mouth 2 (two) times daily as needed for muscle spasms.    Dispense:  60 capsule    Refill:  0    Do not place this medication, or any other prescription from our practice, on "Automatic Refill". Patient may have prescription filled one day early if pharmacy is closed on scheduled refill date.   Medications administered during this visit: Mr. Holzheimer had no medications administered during this visit.   Pharmacological management options:  Opioid Analgesics: Hydrocodone 5 mg twice a day to 3 times a day when necessary   Membrane stabilizer: To be determined at a later time has tried gabapentin in the past was not effective. Can consider Lyrica but we'll be mindful of lower extremity swelling in the context of diastolic heart failure, consider Topamax .   Muscle relaxant: To be determined at a later time, currently on tizanidine, increasing dose which the patient finds effective   NSAID: To be determined at a later time However try to avoid given history of coronary artery disease and cardiomyopathy,   Other  analgesic(s): To be determined at a later time TENS unit, compounded cream    Interventional management options: Mr. Weinberg was informed that there is no guarantee that he would be a candidate for interventional therapies. The decision will be based on the results of diagnostic studies, as well as Mr. Plotts risk profile.  Procedure(s) under consideration:  -Left L3, L4, L5 medial branch nerve block with goal radiofrequency ablation -Left SI joint injection (consider hip xray) -Lumbar trigger point injections    Provider-requested follow-up:  Return for Procedure.  Future Appointments Date Time Provider [place]  05/02/2017 9:45 AM Gillis Santa, MD [facility] None  05/03/2017 9:00 AM Deboraha Sprang, MD [facility]  05/07/2017 2:20 PM Minna Merritts, MD [facility]  07/09/2017 9:45 AM Eustace Pen, LPN [facility]  07/11/2017 10:30 AM Ria Bush, MD [facility]    Primary Care Physician: Ria Bush, MD Location: [facility] Note by: Gillis Santa, M.D, Date: 04/24/2017; Time: 3:17 PM  Patient Instructions   It was nice meeting you today. Today we discussed the following:  #1. Schedule you for diagnostic left L3, L4, L5 lumbar medial branch nerve block. If this is effective for your low back and left posterior thigh pain, we will proceed with radiofrequency ablation.  Please stop your Eliquis (apixaban),  On Friday, August 31 for your procedure on Wednesday. You can restart this medication 24 hours after the procedure.  #2. Prescription for tizanidine 4 mg twice a day when necessary muscle spasm  #3. Urine Drug screen today

## 2017-04-25 ENCOUNTER — Telehealth: Payer: Self-pay | Admitting: Student in an Organized Health Care Education/Training Program

## 2017-04-25 NOTE — Telephone Encounter (Signed)
Called to cancel procedure appt until they have spoken with his cardiologist. They will call to reschedule.

## 2017-04-27 ENCOUNTER — Encounter: Payer: Self-pay | Admitting: Family Medicine

## 2017-04-29 LAB — COMPLIANCE DRUG ANALYSIS, UR

## 2017-05-02 ENCOUNTER — Ambulatory Visit: Payer: Medicare Other | Admitting: Student in an Organized Health Care Education/Training Program

## 2017-05-02 NOTE — Progress Notes (Signed)
ELECTROPHYSIOLOGY Office NOTE  Patient ID: Anthony [name], MRN: [id number], DOB/AGE: [birth year] 79 y.o. Admit date: (Not on file) Date of Consult: 05/03/2017  Primary Physician: Ria Bush, MD Primary Cardiologist: [facility]     Anthony [name] is a 81 y.o. male who is being seen today for the evaluation of Atrial flutter at the request of his son Bryston Colocho.    HPI Anthony [name] is a 81 y.o. male is the  father of [name] who is recently moved here from [place].  He has been followed by [facility]. He has a history of coronary artery disease with prior stenting of his LAD and his RCA in 2013.  Resenting with acute diastolic heart failure in the spring of 2018 he was found to have atrial flutter unassociated with palpitations. He was anticoagulated. He was referred for catheter ablation but has moved here to [place] in the interim.  He has had no further heart failure symptoms. He denies exertional shortness of breath but he notes that his activity is limited more by his back for which he has an implanted stimulator    Echocardiogram 6/18 demonstrated normal LV function and normal left atrial size  Thromboembolic risk factors ( age -106, HTN-1 , Vasc disease -1) for a CHADSVASc Score of 4   He has treated sleep apnea.  He has been managed with a combination of aspirin plus apixoban; there has been no bleeding issues.  Past Medical History:  Diagnosis Date  . Anosmia 1980s  . Atrial flutter ([place])   . CAD (coronary artery disease)   . Chronic constipation   . Chronic insomnia   . Chronic lower back pain    s/p spine stimulator placement  . Diastolic CHF ([place])   . History of diverticulitis   . Hyperlipidemia   . Hypertension   . Hypothyroidism   . Obesity, Class II, BMI 35-39.9, with comorbidity   . OSA on CPAP       Surgical History:  Past Surgical History:  Procedure Laterality Date  . NASAL SINUS SURGERY     nasal polyps  .  PERCUTANEOUS CORONARY STENT INTERVENTION (PCI-S)     stent placement  . REPLACEMENT TOTAL KNEE BILATERAL Bilateral 2000s  . SPINAL CORD STIMULATOR IMPLANT  06/2016     Home Meds: Prior to Admission medications   Medication Sig Start Date End Date Taking? Authorizing Provider  apixaban (ELIQUIS) 5 MG TABS tablet Take 5 mg by mouth 2 (two) times daily.    [provider]  aspirin 81 MG EC tablet Take 81 mg by mouth daily. Swallow whole.    [provider]  diphenhydramine-acetaminophen (TYLENOL PM EXTRA STRENGTH) 25-500 MG TABS tablet Take 1 tablet by mouth at bedtime as needed. 04/16/17   Ria Bush, MD  docusate sodium (COLACE) 100 MG capsule Take 100 mg by mouth 2 (two) times daily.    [provider]  Eszopiclone 3 MG TABS Take 1 mg by mouth at bedtime as needed. Take immediately before bedtime    [provider]  ezetimibe (ZETIA) 10 MG tablet Take 1 tablet (10 mg total) by mouth daily. 04/16/17   Ria Bush, MD  fenofibrate 160 MG tablet Take 1 tablet (160 mg total) by mouth daily. 04/16/17   Ria Bush, MD  finasteride (PROSCAR) 5 MG tablet Take 5 mg by mouth daily.    [provider]  furosemide (LASIX) 40 MG tablet Take 1  tablet (40 mg total) by mouth daily. Take extra tablet as needed for weight gain >3lb/day or 5lb/wk 04/16/17   Ria Bush, MD  HYDROcodone-acetaminophen (NORCO/VICODIN) 5-325 MG tablet Take 1 tablet by mouth every 6 (six) hours as needed for moderate pain.    [provider]  ketoconazole (NIZORAL) 2 % cream Apply 1 application topically daily.    [provider]  levothyroxine (SYNTHROID, LEVOTHROID) 200 MCG tablet Take 1 tablet (200 mcg total) by mouth daily before breakfast. 04/16/17   Ria Bush, MD  linaclotide [facility]) 145 MCG CAPS capsule Take 1 capsule (145 mcg total) by mouth daily. Take on an empty stomach at least 30 minutes before 1st meal of the day Patient not  taking: Reported on 04/24/2017 04/16/17   Ria Bush, MD  lisinopril (PRINIVIL,ZESTRIL) 2.5 MG tablet Take 2.5 mg by mouth daily.    [provider]  metoprolol succinate (TOPROL-XL) 50 MG 24 hr tablet Take 1 tablet (50 mg total) by mouth 2 (two) times daily. 04/16/17   Ria Bush, MD  montelukast (SINGULAIR) 10 MG tablet Take 10 mg by mouth at bedtime.    [provider]  Multiple Vitamin (MULTIVITAMIN) capsule Take 1 capsule by mouth daily.    [provider]  multivitamin-lutein (OCUVITE-LUTEIN) CAPS capsule Take 1 capsule by mouth daily.    [provider]  nitroGLYCERIN (NITROSTAT) 0.4 MG SL tablet Place 0.4 mg under the tongue every 5 (five) minutes as needed for chest pain.    [provider]  omega-3 acid ethyl esters (LOVAZA) 1 g capsule Take 2 g by mouth 2 (two) times daily.    [provider]  polyethylene glycol (MIRALAX / GLYCOLAX) packet Take 17 g by mouth daily as needed for moderate constipation. Mix with water, juice, coffee or tea 04/16/17   Ria Bush, MD  potassium chloride (K-DUR,KLOR-CON) 10 MEQ tablet Take 10 mEq by mouth 2 (two) times daily.    [provider]  tamsulosin (FLOMAX) 0.4 MG CAPS capsule Take 2 capsules (0.8 mg total) by mouth daily after supper. 04/16/17   Ria Bush, MD  tiZANidine (ZANAFLEX) 4 MG capsule Take 1 capsule (4 mg total) by mouth 2 (two) times daily as needed for muscle spasms. 04/24/17   Gillis Santa, MD  zaleplon (SONATA) 10 MG capsule Take 10 mg by mouth at bedtime as needed for sleep.    [provider]    Allergies:  Allergies  Allergen Reactions  . Atorvastatin     Muscle pain  . Gemfibrozil   . Metformin And Related     Dizziness  . Questran [Cholestyramine]   . Rosuvastatin     Muscle pain    Social History   Social History  . Marital status: Widowed    Spouse name: N/A  . Number of children: N/A  . Years of education: N/A    Occupational History  . Not on file.   Social History Main Topics  . Smoking status: Never Smoker  . Smokeless tobacco: Never Used  . Alcohol use No  . Drug use: No  . Sexual activity: No   Other Topics Concern  . Not on file   Social History Narrative   Widower. Lives alone, GF Miss [name] sometimes (she was nurse)   Son is [name] (FP at [employer])   Occ: retired, used to [employer] store   Edu: HS     Family History  Problem Relation Age of Onset  .  Stroke Mother   . Kidney disease Father   . Leukemia Brother   . Stroke Sister   . Diabetes Neg Hx      ROS:  Please see the history of present illness.     All other systems reviewed and negative.    Physical Exam: Blood pressure 106/60, pulse 82, height 6' (1.829 m), weight 285 lb 8 oz (129.5 kg). General: Well developed, well nourished male in no acute distress. Head: Normocephalic, atraumatic, sclera non-icteric, no xanthomas, nares are without discharge. EENT: normal  Lymph Nodes:  none Neck: Negative for carotid bruits. JVD not elevated. Back:without scoliosis kyphosis Lungs: Clear bilaterally to auscultation without wheezes, rales, or rhonchi. Breathing is unlabored. Heart: irregular rate and rhythm without murmur . No rubs, or gallops appreciated. Abdomen: Soft, non-tender, non-distended with normoactive bowel sounds. No hepatomegaly. No rebound/guarding. No obvious abdominal masses. Msk:  Strength and tone appear normal for age. Extremities: No clubbing or cyanosis. No edema.  Distal pedal pulses are 2+ and equal bilaterally. Skin: Warm and Dry Neuro: Alert and oriented X 3. CN III-XII intact Grossly normal sensory and motor function . Psych:  Responds to questions appropriately with a normal affect.      Labs: Cardiac Enzymes No results for input(s): CKTOTAL, CKMB, TROPONINI in the last 72 hours. CBC Lab Results  Component Value Date   WBC 8.0 04/16/2017   HGB 14.1  04/16/2017   HCT 42.6 04/16/2017   MCV 95.8 04/16/2017   PLT 279.0 04/16/2017   PROTIME: No results for input(s): LABPROT, INR in the last 72 hours. Chemistry No results for input(s): NA, K, CL, CO2, BUN, CREATININE, CALCIUM, PROT, BILITOT, ALKPHOS, ALT, AST, GLUCOSE in the last 168 hours.  Invalid input(s): LABALBU Lipids No results found for: CHOL, HDL, LDLCALC, TRIG BNP No results found for: PROBNP Thyroid Function Tests: No results for input(s): TSH, T4TOTAL, T3FREE, THYROIDAB in the last 72 hours.  Invalid input(s): FREET3 Miscellaneous No results found for: DDIMER  Radiology/Studies:  No results found.  EKG:  Atrial flutter - typical Atrial cycle length220 ms with variable conduction Intervals-/12/39 Axis -50  Assessment and Plan:  Atrial flutter-typical  coronary artery disease with prior stenting   We have discussed catheter ablation for atrial flutter. Given thromboembolic risks and bleeding risks on ELIQUIS, it is reasonable to proceed not withstanding its asymptomatic nature. There is a high likelihood that he would develop atrial fibrillation following the procedure and we have discussed using a pulse oximeter to identify if it were to occur so as to be able to resume anticoagulation  For now will have him hold ASA with plans to resume following the ablation  He had a very disrupted response to general anesthesia previously with thrashing and hitting.  Hence, we have elected to forgo a strategy of preablation DCCV so as to be able to do the procedure without concomitant anticoagulation, and will proceed under sedation with versed and fentanyl which he has tolerated reasonably in the past.  He is aware that there may be some discomfort.  I have spoken with his son       Virl Axe

## 2017-05-03 ENCOUNTER — Ambulatory Visit (INDEPENDENT_AMBULATORY_CARE_PROVIDER_SITE_OTHER): Payer: Medicare Other | Admitting: Internal Medicine

## 2017-05-03 ENCOUNTER — Encounter: Payer: Self-pay | Admitting: Internal Medicine

## 2017-05-03 ENCOUNTER — Encounter: Payer: Self-pay | Admitting: *Deleted

## 2017-05-03 VITALS — BP 106/60 | HR 82 | Ht 72.0 in | Wt 285.5 lb

## 2017-05-03 DIAGNOSIS — I2510 Atherosclerotic heart disease of native coronary artery without angina pectoris: Secondary | ICD-10-CM

## 2017-05-03 DIAGNOSIS — I4892 Unspecified atrial flutter: Secondary | ICD-10-CM | POA: Diagnosis not present

## 2017-05-03 DIAGNOSIS — Z01812 Encounter for preprocedural laboratory examination: Secondary | ICD-10-CM

## 2017-05-03 NOTE — Patient Instructions (Signed)
Medication Instructions: - Your physician recommends that you continue on your current medications as directed. Please refer to the Current Medication list given to you today.  Labwork: - Your physician recommends that you have lab work today: BMP/CBC  Procedures/Testing: - Your physician has recommended that you have an atrial flutter ablation. Catheter ablation is a medical procedure used to treat some cardiac arrhythmias (irregular heartbeats). During catheter ablation, a long, thin, flexible tube is put into a blood vessel in your groin (upper thigh), or neck. This tube is called an ablation catheter. It is then guided to your heart through the blood vessel. Radio frequency waves destroy small areas of heart tissue where abnormal heartbeats may cause an arrhythmia to start. Please see the instruction sheet given to you today.  Follow-Up: - Your physician recommends that you schedule a follow-up appointment in: 4 weeks (from 05/07/17) with Dr. Caryl Comes.    Any Additional Special Instructions Will Be Listed Below (If Applicable).     If you need a refill on your cardiac medications before your next appointment, please call your pharmacy.

## 2017-05-04 ENCOUNTER — Telehealth: Payer: Self-pay | Admitting: Internal Medicine

## 2017-05-04 LAB — BASIC METABOLIC PANEL
BUN/Creatinine Ratio: 16 (ref 10–24)
BUN: 23 mg/dL (ref 8–27)
CO2: 26 mmol/L (ref 20–29)
CREATININE: 1.41 mg/dL — AB (ref 0.76–1.27)
Calcium: 10.8 mg/dL — ABNORMAL HIGH (ref 8.6–10.2)
Chloride: 101 mmol/L (ref 96–106)
GFR, EST AFRICAN AMERICAN: 54 mL/min/{1.73_m2} — AB (ref >59)
GFR, EST NON AFRICAN AMERICAN: 47 mL/min/{1.73_m2} — AB (ref >59)
Glucose: 108 mg/dL — ABNORMAL HIGH (ref 65–99)
Potassium: 4.5 mmol/L (ref 3.5–5.2)
Sodium: 139 mmol/L (ref 134–144)

## 2017-05-04 LAB — CBC WITH DIFFERENTIAL/PLATELET
BASOS: 3 %
Basophils Absolute: 0.2 10*3/uL (ref 0.0–0.2)
EOS (ABSOLUTE): 0.6 10*3/uL — ABNORMAL HIGH (ref 0.0–0.4)
EOS: 8 %
HEMATOCRIT: 43.3 % (ref 37.5–51.0)
HEMOGLOBIN: 14.4 g/dL (ref 13.0–17.7)
Immature Grans (Abs): 0.1 10*3/uL (ref 0.0–0.1)
Immature Granulocytes: 1 %
LYMPHS ABS: 2.9 10*3/uL (ref 0.7–3.1)
Lymphs: 40 %
MCH: 31 pg (ref 26.6–33.0)
MCHC: 33.3 g/dL (ref 31.5–35.7)
MCV: 93 fL (ref 79–97)
MONOS ABS: 0.6 10*3/uL (ref 0.1–0.9)
Monocytes: 9 %
NEUTROS ABS: 2.9 10*3/uL (ref 1.4–7.0)
Neutrophils: 39 %
Platelets: 272 10*3/uL (ref 150–379)
RBC: 4.64 x10E6/uL (ref 4.14–5.80)
RDW: 15 % (ref 12.3–15.4)
WBC: 7.2 10*3/uL (ref 3.4–10.8)

## 2017-05-04 NOTE — Telephone Encounter (Signed)
Pt friend would like to know if pt needs to hold Eliquis for procedure on Monday. Please call and advise.

## 2017-05-04 NOTE — Telephone Encounter (Signed)
I spoke with Volney Presser- she was with the patient at his office visit yesterday.  She is aware that the patient will need to stay on eliquis 5 mg BID and not miss any doses prior to his procedure since his heart was out of rhythm yesterday. She voices understanding.

## 2017-05-07 ENCOUNTER — Ambulatory Visit (HOSPITAL_COMMUNITY)
Admission: RE | Admit: 2017-05-07 | Discharge: 2017-05-07 | Disposition: A | Discharge status: Home / Self Care | Payer: Medicare Other | Source: Ambulatory Visit | Attending: Internal Medicine | Admitting: Internal Medicine

## 2017-05-07 ENCOUNTER — Ambulatory Visit: Payer: Self-pay | Admitting: Cardiovascular Disease

## 2017-05-07 ENCOUNTER — Encounter (HOSPITAL_COMMUNITY)
Admission: RE | Disposition: A | Discharge status: Home / Self Care | Payer: Self-pay | Source: Ambulatory Visit | Attending: Internal Medicine

## 2017-05-07 DIAGNOSIS — Z8679 Personal history of other diseases of the circulatory system: Secondary | ICD-10-CM | POA: Diagnosis present

## 2017-05-07 DIAGNOSIS — E785 Hyperlipidemia, unspecified: Secondary | ICD-10-CM | POA: Insufficient documentation

## 2017-05-07 DIAGNOSIS — I2510 Atherosclerotic heart disease of native coronary artery without angina pectoris: Secondary | ICD-10-CM | POA: Diagnosis not present

## 2017-05-07 DIAGNOSIS — Z79899 Other long term (current) drug therapy: Secondary | ICD-10-CM | POA: Diagnosis not present

## 2017-05-07 DIAGNOSIS — Z7983 Long term (current) use of bisphosphonates: Secondary | ICD-10-CM | POA: Diagnosis not present

## 2017-05-07 DIAGNOSIS — Z955 Presence of coronary angioplasty implant and graft: Secondary | ICD-10-CM | POA: Insufficient documentation

## 2017-05-07 DIAGNOSIS — I4892 Unspecified atrial flutter: Secondary | ICD-10-CM | POA: Diagnosis not present

## 2017-05-07 DIAGNOSIS — I110 Hypertensive heart disease with heart failure: Secondary | ICD-10-CM | POA: Insufficient documentation

## 2017-05-07 DIAGNOSIS — Z7901 Long term (current) use of anticoagulants: Secondary | ICD-10-CM | POA: Diagnosis not present

## 2017-05-07 DIAGNOSIS — R002 Palpitations: Secondary | ICD-10-CM | POA: Diagnosis not present

## 2017-05-07 DIAGNOSIS — G4733 Obstructive sleep apnea (adult) (pediatric): Secondary | ICD-10-CM | POA: Diagnosis not present

## 2017-05-07 DIAGNOSIS — Z6837 Body mass index (BMI) 37.0-37.9, adult: Secondary | ICD-10-CM | POA: Diagnosis not present

## 2017-05-07 DIAGNOSIS — E039 Hypothyroidism, unspecified: Secondary | ICD-10-CM | POA: Insufficient documentation

## 2017-05-07 DIAGNOSIS — E669 Obesity, unspecified: Secondary | ICD-10-CM | POA: Diagnosis not present

## 2017-05-07 DIAGNOSIS — I5032 Chronic diastolic (congestive) heart failure: Secondary | ICD-10-CM | POA: Insufficient documentation

## 2017-05-07 DIAGNOSIS — Z7982 Long term (current) use of aspirin: Secondary | ICD-10-CM | POA: Insufficient documentation

## 2017-05-07 HISTORY — PX: A-FLUTTER ABLATION: EP1230

## 2017-05-07 SURGERY — A-FLUTTER ABLATION

## 2017-05-07 MED ORDER — SODIUM CHLORIDE 0.9 % IV SOLN: INTRAVENOUS | Status: AC

## 2017-05-07 MED ORDER — FENTANYL CITRATE (PF) 100 MCG/2ML IJ SOLN
INTRAMUSCULAR | Status: AC
  Filled 2017-05-07: qty 2

## 2017-05-07 MED ORDER — SODIUM CHLORIDE 0.9 % IV SOLN: 250.0000 mL | INTRAVENOUS | Status: DC | PRN

## 2017-05-07 MED ORDER — MIDAZOLAM HCL 5 MG/5ML IJ SOLN
INTRAMUSCULAR | Status: AC
Start: 2017-05-07 — End: ?
  Filled 2017-05-07: qty 5

## 2017-05-07 MED ORDER — LIDOCAINE HCL (PF) 1 % IJ SOLN
INTRAMUSCULAR | Status: AC
  Filled 2017-05-07: qty 60

## 2017-05-07 MED ORDER — SODIUM CHLORIDE 0.9 % IV SOLN
INTRAVENOUS | Status: DC
  Administered 2017-05-07: 09:00:00 via INTRAVENOUS

## 2017-05-07 MED ORDER — LIDOCAINE HCL (PF) 1 % IJ SOLN
INTRAMUSCULAR | Status: DC | PRN
  Administered 2017-05-07: 45 mL

## 2017-05-07 MED ORDER — SODIUM CHLORIDE 0.9% FLUSH: 3.0000 mL | INTRAVENOUS | Status: DC | PRN

## 2017-05-07 MED ORDER — MIDAZOLAM HCL 5 MG/5ML IJ SOLN
INTRAMUSCULAR | Status: DC | PRN
  Administered 2017-05-07 (×2): 1 mg via INTRAVENOUS
  Administered 2017-05-07 (×2): 2 mg via INTRAVENOUS
  Administered 2017-05-07: 1 mg via INTRAVENOUS

## 2017-05-07 MED ORDER — HEPARIN (PORCINE) IN NACL 2-0.9 UNIT/ML-% IJ SOLN
INTRAMUSCULAR | Status: AC | PRN
  Administered 2017-05-07: 500 mL

## 2017-05-07 MED ORDER — SODIUM CHLORIDE 0.9% FLUSH: 3.0000 mL | Freq: Two times a day (BID) | INTRAVENOUS | Status: DC

## 2017-05-07 MED ORDER — HEPARIN (PORCINE) IN NACL 2-0.9 UNIT/ML-% IJ SOLN
INTRAMUSCULAR | Status: AC
  Filled 2017-05-07: qty 500

## 2017-05-07 MED ORDER — MIDAZOLAM HCL 5 MG/5ML IJ SOLN
INTRAMUSCULAR | Status: AC
  Filled 2017-05-07: qty 5

## 2017-05-07 MED ORDER — FENTANYL CITRATE (PF) 100 MCG/2ML IJ SOLN
INTRAMUSCULAR | Status: DC | PRN
  Administered 2017-05-07 (×4): 25 ug via INTRAVENOUS
  Administered 2017-05-07: 50 ug via INTRAVENOUS

## 2017-05-07 SURGICAL SUPPLY — 14 items
BAG SNAP BAND KOVER 36X36 (MISCELLANEOUS) ×2 IMPLANT
CATH BLAZERPRIME XP LG CV 10MM (ABLATOR) ×2 IMPLANT
CATH DUODECA HALO/ISMUS 7FR (CATHETERS) ×2 IMPLANT
CATH OCTAPOLOR 6F 125CM 2-5-2 (CATHETERS) ×2 IMPLANT
CATH QUAD COURNAND 5FR (CATHETERS) ×2 IMPLANT
HOVERMATT SINGLE USE (MISCELLANEOUS) ×2 IMPLANT
PACK EP LATEX FREE (CUSTOM PROCEDURE TRAY) ×1
PACK EP LF (CUSTOM PROCEDURE TRAY) ×1 IMPLANT
PAD DEFIB LIFELINK (PAD) ×2 IMPLANT
SHEATH ATRIAL FLUTTER SAFL 8F (SHEATH) ×2 IMPLANT
SHEATH PINNACLE 6F 10CM (SHEATH) ×2 IMPLANT
SHEATH PINNACLE 7F 10CM (SHEATH) ×2 IMPLANT
SHEATH PINNACLE 8F 10CM (SHEATH) ×4 IMPLANT
SHIELD RADPAD SCOOP 12X17 (MISCELLANEOUS) ×2 IMPLANT

## 2017-05-07 NOTE — H&P (View-Only) (Signed)
ELECTROPHYSIOLOGY Office NOTE  Patient ID: Anthony [name], MRN: [id number], DOB/AGE: 79-Jan-1937 79 y.o. Admit date: (Not on file) Date of Consult: 05/03/2017  Primary Physician: Ria Bush, MD Primary Cardiologist: [facility]     Anthony [name] is a 81 y.o. male who is being seen today for the evaluation of Atrial flutter at the request of his son Damian Buckles.    HPI Anthony [name] is a 81 y.o. male is the  father of [name] who is recently moved here from [place].  He has been followed by [facility]. He has a history of coronary artery disease with prior stenting of his LAD and his RCA in 2013.  Resenting with acute diastolic heart failure in the spring of 2018 he was found to have atrial flutter unassociated with palpitations. He was anticoagulated. He was referred for catheter ablation but has moved here to [place] in the interim.  He has had no further heart failure symptoms. He denies exertional shortness of breath but he notes that his activity is limited more by his back for which he has an implanted stimulator    Echocardiogram 6/18 demonstrated normal LV function and normal left atrial size  Thromboembolic risk factors ( age -26, HTN-1 , Vasc disease -1) for a CHADSVASc Score of 4   He has treated sleep apnea.  He has been managed with a combination of aspirin plus apixoban; there has been no bleeding issues.  Past Medical History:  Diagnosis Date  . Anosmia 1980s  . Atrial flutter ([place])   . CAD (coronary artery disease)   . Chronic constipation   . Chronic insomnia   . Chronic lower back pain    s/p spine stimulator placement  . Diastolic CHF ([place])   . History of diverticulitis   . Hyperlipidemia   . Hypertension   . Hypothyroidism   . Obesity, Class II, BMI 35-39.9, with comorbidity   . OSA on CPAP       Surgical History:  Past Surgical History:  Procedure Laterality Date  . NASAL SINUS SURGERY     nasal polyps  .  PERCUTANEOUS CORONARY STENT INTERVENTION (PCI-S)     stent placement  . REPLACEMENT TOTAL KNEE BILATERAL Bilateral 2000s  . SPINAL CORD STIMULATOR IMPLANT  06/2016     Home Meds: Prior to Admission medications   Medication Sig Start Date End Date Taking? Authorizing Provider  apixaban (ELIQUIS) 5 MG TABS tablet Take 5 mg by mouth 2 (two) times daily.    [provider]  aspirin 81 MG EC tablet Take 81 mg by mouth daily. Swallow whole.    [provider]  diphenhydramine-acetaminophen (TYLENOL PM EXTRA STRENGTH) 25-500 MG TABS tablet Take 1 tablet by mouth at bedtime as needed. 04/16/17   Ria Bush, MD  docusate sodium (COLACE) 100 MG capsule Take 100 mg by mouth 2 (two) times daily.    [provider]  Eszopiclone 3 MG TABS Take 1 mg by mouth at bedtime as needed. Take immediately before bedtime    [provider]  ezetimibe (ZETIA) 10 MG tablet Take 1 tablet (10 mg total) by mouth daily. 04/16/17   Ria Bush, MD  fenofibrate 160 MG tablet Take 1 tablet (160 mg total) by mouth daily. 04/16/17   Ria Bush, MD  finasteride (PROSCAR) 5 MG tablet Take 5 mg by mouth daily.    [provider]  furosemide (LASIX) 40 MG tablet Take 1  tablet (40 mg total) by mouth daily. Take extra tablet as needed for weight gain >3lb/day or 5lb/wk 04/16/17   Ria Bush, MD  HYDROcodone-acetaminophen (NORCO/VICODIN) 5-325 MG tablet Take 1 tablet by mouth every 6 (six) hours as needed for moderate pain.    [provider]  ketoconazole (NIZORAL) 2 % cream Apply 1 application topically daily.    [provider]  levothyroxine (SYNTHROID, LEVOTHROID) 200 MCG tablet Take 1 tablet (200 mcg total) by mouth daily before breakfast. 04/16/17   Ria Bush, MD  linaclotide [facility]) 145 MCG CAPS capsule Take 1 capsule (145 mcg total) by mouth daily. Take on an empty stomach at least 30 minutes before 1st meal of the day Patient not  taking: Reported on 04/24/2017 04/16/17   Ria Bush, MD  lisinopril (PRINIVIL,ZESTRIL) 2.5 MG tablet Take 2.5 mg by mouth daily.    [provider]  metoprolol succinate (TOPROL-XL) 50 MG 24 hr tablet Take 1 tablet (50 mg total) by mouth 2 (two) times daily. 04/16/17   Ria Bush, MD  montelukast (SINGULAIR) 10 MG tablet Take 10 mg by mouth at bedtime.    [provider]  Multiple Vitamin (MULTIVITAMIN) capsule Take 1 capsule by mouth daily.    [provider]  multivitamin-lutein (OCUVITE-LUTEIN) CAPS capsule Take 1 capsule by mouth daily.    [provider]  nitroGLYCERIN (NITROSTAT) 0.4 MG SL tablet Place 0.4 mg under the tongue every 5 (five) minutes as needed for chest pain.    [provider]  omega-3 acid ethyl esters (LOVAZA) 1 g capsule Take 2 g by mouth 2 (two) times daily.    [provider]  polyethylene glycol (MIRALAX / GLYCOLAX) packet Take 17 g by mouth daily as needed for moderate constipation. Mix with water, juice, coffee or tea 04/16/17   Ria Bush, MD  potassium chloride (K-DUR,KLOR-CON) 10 MEQ tablet Take 10 mEq by mouth 2 (two) times daily.    [provider]  tamsulosin (FLOMAX) 0.4 MG CAPS capsule Take 2 capsules (0.8 mg total) by mouth daily after supper. 04/16/17   Ria Bush, MD  tiZANidine (ZANAFLEX) 4 MG capsule Take 1 capsule (4 mg total) by mouth 2 (two) times daily as needed for muscle spasms. 04/24/17   Gillis Santa, MD  zaleplon (SONATA) 10 MG capsule Take 10 mg by mouth at bedtime as needed for sleep.    [provider]    Allergies:  Allergies  Allergen Reactions  . Atorvastatin     Muscle pain  . Gemfibrozil   . Metformin And Related     Dizziness  . Questran [Cholestyramine]   . Rosuvastatin     Muscle pain    Social History   Social History  . Marital status: Widowed    Spouse name: N/A  . Number of children: N/A  . Years of education: N/A    Occupational History  . Not on file.   Social History Main Topics  . Smoking status: Never Smoker  . Smokeless tobacco: Never Used  . Alcohol use No  . Drug use: No  . Sexual activity: No   Other Topics Concern  . Not on file   Social History Narrative   Widower. Lives alone, GF Miss [name] sometimes (she was nurse)   Son is [name] (FP at [employer])   Occ: retired, used to [employer] store   Edu: HS     Family History  Problem Relation Age of Onset  .  Stroke Mother   . Kidney disease Father   . Leukemia Brother   . Stroke Sister   . Diabetes Neg Hx      ROS:  Please see the history of present illness.     All other systems reviewed and negative.    Physical Exam: Blood pressure 106/60, pulse 82, height 6' (1.829 m), weight 285 lb 8 oz (129.5 kg). General: Well developed, well nourished male in no acute distress. Head: Normocephalic, atraumatic, sclera non-icteric, no xanthomas, nares are without discharge. EENT: normal  Lymph Nodes:  none Neck: Negative for carotid bruits. JVD not elevated. Back:without scoliosis kyphosis Lungs: Clear bilaterally to auscultation without wheezes, rales, or rhonchi. Breathing is unlabored. Heart: irregular rate and rhythm without murmur . No rubs, or gallops appreciated. Abdomen: Soft, non-tender, non-distended with normoactive bowel sounds. No hepatomegaly. No rebound/guarding. No obvious abdominal masses. Msk:  Strength and tone appear normal for age. Extremities: No clubbing or cyanosis. No edema.  Distal pedal pulses are 2+ and equal bilaterally. Skin: Warm and Dry Neuro: Alert and oriented X 3. CN III-XII intact Grossly normal sensory and motor function . Psych:  Responds to questions appropriately with a normal affect.      Labs: Cardiac Enzymes No results for input(s): CKTOTAL, CKMB, TROPONINI in the last 72 hours. CBC Lab Results  Component Value Date   WBC 8.0 04/16/2017   HGB 14.1  04/16/2017   HCT 42.6 04/16/2017   MCV 95.8 04/16/2017   PLT 279.0 04/16/2017   PROTIME: No results for input(s): LABPROT, INR in the last 72 hours. Chemistry No results for input(s): NA, K, CL, CO2, BUN, CREATININE, CALCIUM, PROT, BILITOT, ALKPHOS, ALT, AST, GLUCOSE in the last 168 hours.  Invalid input(s): LABALBU Lipids No results found for: CHOL, HDL, LDLCALC, TRIG BNP No results found for: PROBNP Thyroid Function Tests: No results for input(s): TSH, T4TOTAL, T3FREE, THYROIDAB in the last 72 hours.  Invalid input(s): FREET3 Miscellaneous No results found for: DDIMER  Radiology/Studies:  No results found.  EKG:  Atrial flutter - typical Atrial cycle length220 ms with variable conduction Intervals-/12/39 Axis -50  Assessment and Plan:  Atrial flutter-typical  coronary artery disease with prior stenting   We have discussed catheter ablation for atrial flutter. Given thromboembolic risks and bleeding risks on ELIQUIS, it is reasonable to proceed not withstanding its asymptomatic nature. There is a high likelihood that he would develop atrial fibrillation following the procedure and we have discussed using a pulse oximeter to identify if it were to occur so as to be able to resume anticoagulation  For now will have him hold ASA with plans to resume following the ablation  He had a very disrupted response to general anesthesia previously with thrashing and hitting.  Hence, we have elected to forgo a strategy of preablation DCCV so as to be able to do the procedure without concomitant anticoagulation, and will proceed under sedation with versed and fentanyl which he has tolerated reasonably in the past.  He is aware that there may be some discomfort.  I have spoken with his son       Virl Axe

## 2017-05-07 NOTE — Progress Notes (Signed)
Site area: RFV x 2 / LFV x 2 Site Prior to Removal:  Level 0/0 Pressure Applied For: 20 min/20 min Manual:   Yes/yes Patient Status During Pull:  stable Post Pull Site:  Level 0/0 Post Pull Instructions Given:  yes Post Pull Pulses Present: palpable Dressing Applied:  tegaderm Bedrest begins @ 1320 till 1900 Comments: right by canderson/ left by Marinda Elk

## 2017-05-07 NOTE — Interval H&P Note (Signed)
History and Physical Interval Note:  05/07/2017 9:59 AM  Anthony [name]  has presented today for surgery, with the diagnosis of atrial flutter  The various methods of treatment have been discussed with the patient and family. After consideration of risks, benefits and other options for treatment, the patient has consented to  Procedure(s): A-Flutter Ablation (N/A) as a surgical intervention .  The patient's history has been reviewed, patient examined, no change in status, stable for surgery.  I have reviewed the patient's chart and labs.  Questions were answered to the patient's satisfaction.     Virl Axe  Took apixoban this am

## 2017-05-07 NOTE — Discharge Instructions (Signed)

## 2017-05-08 ENCOUNTER — Encounter (HOSPITAL_COMMUNITY): Payer: Self-pay | Admitting: Internal Medicine

## 2017-05-08 NOTE — Op Note (Signed)
NAME:  REVIS, WHALIN NO.:  [id number]  MEDICAL RECORD NO.:  [id number]  LOCATION:                                 FACILITY:  PHYSICIAN:  Deboraha Sprang, MD, [facility] ([facility])   DATE OF BIRTH:  DATE OF PROCEDURE:  05/07/2017 DATE OF DISCHARGE:                              OPERATIVE REPORT   PREOPERATIVE DIAGNOSIS:  Atrial flutter.  POSTOPERATIVE DIAGNOSIS:  Sinus rhythm.  PROCEDURES:  Invasive electrophysiological study and radiofrequency catheter ablation.  DESCRIPTION OF PROCEDURE:  Following obtaining informed consent, the patient was brought to electrophysiology laboratory and placed on the fluoroscopic table in the supine position.  After routine prep and drape, cardiac catheterization was performed with local anesthesia and conscious sedation.  Noninvasive blood pressure monitoring, O2 saturation monitoring were performed continuously throughout the procedure.  Following the procedure, the catheters were removed, the sheaths were left in place, and the patient was transferred to the holding area in stable condition.  Catheters: 1. A 5-French quadripolar catheter was inserted via the left femoral     vein to the AV junction. 2. A 6-French octapolar catheter was inserted via the right femoral     vein to the coronary sinus. 3. A 7-French dual decapolar catheter was inserted via the left     femoral vein to the tricuspid anulus. 4. An 8-French 10-mm deflectable tip catheter was inserted via the     right femoral vein to mapping sites and posterior septal space.  Surface leads:  I, aVF, and V1 were monitored continuously throughout the procedure.  Following insertion of the catheters, a stimulation protocol included incremental atrial pacing, incremental ventricular pacing.  RESULTS:  Basic measurements:  Initial: 1. Atrial flutter. 2. QRS duration 119 milliseconds; QT interval:  363 millisecond; RR     interval 769 milliseconds; AA interval 260  milliseconds. 3. HV interval 58 milliseconds. Final: 1. Rhythm:  Sinus; RR interval 817 millisecond; P-wave duration 155     milliseconds; PR interval:  211 milliseconds; QRS duration:  105     milliseconds; QTc interval:  372 milliseconds. 2. AH interval and HV interval were not measured just prior to     catheter removal, but prior to that and following catheter     ablation, they were normal.  AV NODAL FUNCTION: 1. AV Wenckebach was 500 milliseconds post ablation. 2. VA conduction was dissociated at 500 milliseconds. 3. AV nodal effective refractory period at 700 milliseconds was 370     milliseconds without evidence of dual AV nodal physiology.  ACCESSORY PATHWAY FUNCTION: 1. No evidence of accessory pathway was identified.  VENTRICULAR RESPONSE TO PROGRAMED STIMULATION:  Normal for ventricular stimulation as described.  FLUOROSCOPY TIME:  A total of 9.2 minutes of fluoroscopy time was utilized.  RADIOFREQUENCY ENERGY:  A total of 4.5 minutes of RF energy was applied across the cavotricuspid isthmus.  IMPRESSION: 1. Sinus bradycardia. 2. Abnormal atrial function manifested by sustained atrial flutter     with successful termination of atrial flutter and elimination of     cavotricuspid isthmus conduction and thereby the substrate for the     patient's atrial flutter. 3. Normal AV nodal function. 4.  Normal His-Purkinje system function. 5. No accessory pathway. 6. Normal ventricular response to programed stimulation.  SUMMARY:  In conclusion, the results of electrophysiological testing confirmed cavotricuspid isthmus-dependent atrial flutter.  Catheter ablation across the isthmus successfully interrupted and terminated the tachycardia and cavotricuspid isthmus conduction.  The patient will be observed for 6 hours.  We will continue him on anticoagulation for 4 weeks.  We have reviewed the likelihood that he could have atrial fibrillation and suggested use of O2 sat  monitor, so as to be able to identify the presence of atrial fibrillation.     Deboraha Sprang, MD, [place]   ______________________________ Deboraha Sprang, MD, [facility]    SCK/MEDQ  D:  05/07/2017  T:  05/08/2017  Job:  [id number]

## 2017-05-19 ENCOUNTER — Encounter: Payer: Self-pay | Admitting: Family Medicine

## 2017-05-24 ENCOUNTER — Other Ambulatory Visit: Payer: Self-pay

## 2017-05-24 MED ORDER — FINASTERIDE 5 MG PO TABS: 5.0000 mg | ORAL_TABLET | Freq: Every day | ORAL | 3 refills | Status: DC

## 2017-05-24 MED ORDER — EZETIMIBE 10 MG PO TABS: 10.0000 mg | ORAL_TABLET | Freq: Every day | ORAL | 1 refills | Status: DC

## 2017-05-24 MED ORDER — OMEGA-3-ACID ETHYL ESTERS 1 G PO CAPS: 2.0000 g | ORAL_CAPSULE | Freq: Two times a day (BID) | ORAL | 3 refills | Status: DC

## 2017-05-24 NOTE — Telephone Encounter (Signed)
Dr Caryn Section pts son (DPR signed) request refill from [facility] for zetia transferred to [facility] [address]done) And Dr Darnell Level has not filled yet but request 90 day rx for omega 3(lovaza) and finasteride to [facility] [address] Pt established care on 04/16/17 and has CPX scheduled on 07/11/17.Please advise.

## 2017-05-27 ENCOUNTER — Encounter: Payer: Self-pay | Admitting: Internal Medicine

## 2017-06-04 ENCOUNTER — Other Ambulatory Visit: Payer: Self-pay

## 2017-06-04 MED ORDER — LISINOPRIL 2.5 MG PO TABS: 2.5000 mg | ORAL_TABLET | Freq: Every day | ORAL | 1 refills | Status: DC

## 2017-06-04 MED ORDER — POTASSIUM CHLORIDE CRYS ER 10 MEQ PO TBCR: 10.0000 meq | EXTENDED_RELEASE_TABLET | Freq: Two times a day (BID) | ORAL | 1 refills | Status: DC

## 2017-06-04 NOTE — Telephone Encounter (Signed)
Anthony [name] signed) left v/m requesting 90 day refill lisinopril and K to [facility] [address] Pt established care on 04/16/17 and per office note pt to continue K(Dr Cosgriff said pt taking with furosemide) and Hypertension regimen. Refilled per protocol. Pt has CPX scheduled on 07/11/17. FYI to Dr Darnell Level. I did verify with Dr Caryn Section medication and dosage and instructions which are correct on med list.

## 2017-06-07 ENCOUNTER — Telehealth: Payer: Self-pay | Admitting: *Deleted

## 2017-06-07 ENCOUNTER — Telehealth: Payer: Self-pay | Admitting: Internal Medicine

## 2017-06-07 NOTE — Telephone Encounter (Signed)
   Chart reviewed as part of pre-operative protocol coverage. Given past medical history and time since last visit, based on ACC/AHA guidelines, Anthony [name] would be at acceptable risk for the planned procedure without further cardiovascular testing.   Dr. Caryl Comes has already reached out to the patient and cleared him to hold the medication and undergo the procedure (note dated 05/27/2017).   Erma Heritage, PA-C 06/07/2017, 3:33 PM

## 2017-06-07 NOTE — Telephone Encounter (Deleted)
Spoke with representative from Dr Olin Pia office and she has information re; procedure on 06/13/17 and the need to stop Eliquis.  She will give this message to Dr Olin Pia nurse for review and approval.

## 2017-06-07 NOTE — Telephone Encounter (Signed)
New message         [facility] Pre-operative Risk Assessment    Request for surgical clearance:  What type of surgery is being performed?  Nerve block in back When is this surgery scheduled?06-13-17 1. Are there any medications that need to be held prior to surgery and how long? Stop eliquis 5 days prior starting tomorrow  2. Practice name and name of physician performing surgery? Dr Holley Raring  3. What is your office phone and fax number? Fax [phone]  4. Anesthesia type (None, local, MAC, general) ? IV sedation with versed and fentanyl   Chauncey Reading Price 06/07/2017, 12:35 PM  _________________________________________________________________   (provider comments below)

## 2017-06-07 NOTE — Telephone Encounter (Signed)
Spoke with representative from Dr Olin Pia office and gave information re; procedure on 06/13/17 with Dr Holley Raring and the need to stop Eliquis 5 days prior to procedure. She will review with Dr Caryl Comes.

## 2017-06-07 NOTE — Telephone Encounter (Signed)
Spoke with Anthony [name] to let him know that the information has been sent to Dr Olin Pia office.

## 2017-06-08 ENCOUNTER — Telehealth: Payer: Self-pay | Admitting: *Deleted

## 2017-06-08 NOTE — Telephone Encounter (Signed)
I have called Anthony [name] to let him know that I have not received any clearance as of yet from Dr Caryl Comes.  I have tried several times to reach out to Dr Olin Pia office this morning and it seems that phones lines are down as the call's would not go through.  I have encouraged Don, via voicemail to reach out to Dr Olin Pia office to see if he is okay with Mr Mittelstaedt stopping the Eliquis for the 5 days needed prior to procedure.

## 2017-06-13 ENCOUNTER — Ambulatory Visit: Payer: Medicare Other | Admitting: Student in an Organized Health Care Education/Training Program

## 2017-06-13 ENCOUNTER — Ambulatory Visit (HOSPITAL_BASED_OUTPATIENT_CLINIC_OR_DEPARTMENT_OTHER): Payer: Medicare Other | Admitting: Student in an Organized Health Care Education/Training Program

## 2017-06-13 ENCOUNTER — Encounter: Payer: Self-pay | Admitting: Student in an Organized Health Care Education/Training Program

## 2017-06-13 ENCOUNTER — Ambulatory Visit
Admission: RE | Admit: 2017-06-13 | Discharge: 2017-06-13 | Disposition: A | Discharge status: Home / Self Care | Payer: Medicare Other | Source: Ambulatory Visit | Attending: Student in an Organized Health Care Education/Training Program | Admitting: Student in an Organized Health Care Education/Training Program

## 2017-06-13 VITALS — BP 162/94 | HR 64 | Temp 98.0°F | Resp 16 | Ht 73.0 in | Wt 277.0 lb

## 2017-06-13 DIAGNOSIS — M47816 Spondylosis without myelopathy or radiculopathy, lumbar region: Secondary | ICD-10-CM

## 2017-06-13 DIAGNOSIS — M5136 Other intervertebral disc degeneration, lumbar region: Secondary | ICD-10-CM | POA: Insufficient documentation

## 2017-06-13 DIAGNOSIS — Z9689 Presence of other specified functional implants: Secondary | ICD-10-CM

## 2017-06-13 DIAGNOSIS — M545 Low back pain, unspecified: Secondary | ICD-10-CM

## 2017-06-13 DIAGNOSIS — G8929 Other chronic pain: Secondary | ICD-10-CM

## 2017-06-13 MED ORDER — ROPIVACAINE HCL 2 MG/ML IJ SOLN
10.0000 mL | Freq: Once | INTRAMUSCULAR | Status: AC
  Administered 2017-06-13: 10 mL

## 2017-06-13 MED ORDER — NALOXEGOL OXALATE 25 MG PO TABS: 25.0000 mg | ORAL_TABLET | Freq: Every day | ORAL | 0 refills | Status: AC

## 2017-06-13 MED ORDER — ROPIVACAINE HCL 2 MG/ML IJ SOLN
INTRAMUSCULAR | Status: AC
  Filled 2017-06-13: qty 10

## 2017-06-13 MED ORDER — LIDOCAINE HCL (PF) 1 % IJ SOLN
INTRAMUSCULAR | Status: AC
  Filled 2017-06-13: qty 5

## 2017-06-13 MED ORDER — LIDOCAINE HCL (PF) 1 % IJ SOLN
10.0000 mL | Freq: Once | INTRAMUSCULAR | Status: AC
  Administered 2017-06-13: 5 mL

## 2017-06-13 NOTE — Progress Notes (Signed)
Patient's Name: Anthony [name]  MRN: [id number]  Referring Provider: Ria Bush, MD  DOB: 10/29/1935  PCP: Ria Bush, MD  DOS: 06/13/2017  Note by: Gillis Santa, MD  Service setting: Ambulatory outpatient  Specialty: Interventional Pain Management  Patient type: Established  Location: [facility] (AMB) Pain Management Facility  Visit type: Interventional Procedure   Primary Reason for Visit: Interventional Pain Management Treatment. CC: Back Pain  Procedure:  Anesthesia, Analgesia, Anxiolysis:  Type: Diagnostic Medial Branch Facet Block #1 Region: Lumbar Level: L3, L4, L5, Medial Branch Level(s) Laterality: Left  Type: Local Anesthesia Local Anesthetic: Lidocaine 1% Route: Infiltration ([place]/IM) IV Access: Secured Sedation: Meaningful verbal contact was maintained at all times during the procedure  Indication(s): Analgesia and Anxiety   Indications: 1. Spondylosis without myelopathy or radiculopathy, lumbar region   2. Spinal cord stimulator status   3. Lumbar degenerative disc disease   4. Lumbar facet arthropathy   5. Chronic left-sided low back pain without sciatica    Pain Score: Pre-procedure: 6 /10 Post-procedure: 1 /10  Pre-op Assessment:  Anthony [name] is a 81 y.o. (year old), male patient, seen today for interventional treatment. He  has a past surgical history that includes Percutaneous coronary stent intervention (pci-s); Replacement total knee bilateral (Bilateral, 2000s); Spinal cord stimulator implant (06/2016); Nasal sinus surgery; and A-FLUTTER ABLATION (N/A, 05/07/2017). Anthony [name] has a current medication list which includes the following prescription(s): apixaban, aspirin, diphenhydramine-acetaminophen, docusate sodium, eszopiclone, ezetimibe, fenofibrate, finasteride, furosemide, hydrocodone-acetaminophen, ketoconazole, levothyroxine, linaclotide, lisinopril, metoprolol succinate, montelukast, multivitamin, multivitamin-lutein, nitroglycerin, omega-3 acid ethyl  esters, potassium chloride, tamsulosin, tizanidine, and naloxegol oxalate. His primarily concern today is the Back Pain  Patient stopped Eliquis 5 days ago on October 12. Patient also endorsing opioid-induced constipation that has been refractory to medications such as Dulcolax and MiraLAX. This has limited his ability to take his hydrocodone. I will prescribe him Movantik 25 mg daily to take prior to his first meal to assist with opioid-induced constipation.  Initial Vital Signs: There were no vitals taken for this visit. BMI: Estimated body mass index is 36.55 kg/m as calculated from the following:   Height as of this encounter: 6\' 1"  (1.854 m).   Weight as of this encounter: 277 lb (125.6 kg).  Risk Assessment: Allergies: Reviewed. He is allergic to atorvastatin; gemfibrozil; metformin and related; questran [cholestyramine]; and rosuvastatin.  Allergy Precautions: None required Coagulopathies: Reviewed. None identified.  Blood-thinner therapy: None at this time Active Infection(s): Reviewed. None identified. Anthony [name] is afebrile  Site Confirmation: Anthony [name] was asked to confirm the procedure and laterality before marking the site Procedure checklist: Completed Consent: Before the procedure and under the influence of no sedative(s), amnesic(s), or anxiolytics, the patient was informed of the treatment options, risks and possible complications. To fulfill our ethical and legal obligations, as recommended by the American Medical Association's Code of Ethics, I have informed the patient of my clinical impression; the nature and purpose of the treatment or procedure; the risks, benefits, and possible complications of the intervention; the alternatives, including doing nothing; the risk(s) and benefit(s) of the alternative treatment(s) or procedure(s); and the risk(s) and benefit(s) of doing nothing. The patient was provided information about the general risks and possible complications  associated with the procedure. These may include, but are not limited to: failure to achieve desired goals, infection, bleeding, organ or nerve damage, allergic reactions, paralysis, and death. In addition, the patient was informed of those risks and complications associated to Spine-related procedures, such as failure  to decrease pain; infection (i.e.: Meningitis, epidural or intraspinal abscess); bleeding (i.e.: epidural hematoma, subarachnoid hemorrhage, or any other type of intraspinal or peri-dural bleeding); organ or nerve damage (i.e.: Any type of peripheral nerve, nerve root, or spinal cord injury) with subsequent damage to sensory, motor, and/or autonomic systems, resulting in permanent pain, numbness, and/or weakness of one or several areas of the body; allergic reactions; (i.e.: anaphylactic reaction); and/or death. Furthermore, the patient was informed of those risks and complications associated with the medications. These include, but are not limited to: allergic reactions (i.e.: anaphylactic or anaphylactoid reaction(s)); adrenal axis suppression; blood sugar elevation that in diabetics may result in ketoacidosis or comma; water retention that in patients with history of congestive heart failure may result in shortness of breath, pulmonary edema, and decompensation with resultant heart failure; weight gain; swelling or edema; medication-induced neural toxicity; particulate matter embolism and blood vessel occlusion with resultant organ, and/or nervous system infarction; and/or aseptic necrosis of one or more joints. Finally, the patient was informed that Medicine is not an exact science; therefore, there is also the possibility of unforeseen or unpredictable risks and/or possible complications that may result in a catastrophic outcome. The patient indicated having understood very clearly. We have given the patient no guarantees and we have made no promises. Enough time was given to the patient to  ask questions, all of which were answered to the patient's satisfaction. Anthony [name] has indicated that he wanted to continue with the procedure. Attestation: I, the ordering provider, attest that I have discussed with the patient the benefits, risks, side-effects, alternatives, likelihood of achieving goals, and potential problems during recovery for the procedure that I have provided informed consent. Date: 06/13/2017; Time: 12:32 PM  Pre-Procedure Preparation:  Monitoring: As per clinic protocol. Respiration, ETCO2, SpO2, BP, heart rate and rhythm monitor placed and checked for adequate function Safety Precautions: Patient was assessed for positional comfort and pressure points before starting the procedure. Time-out: I initiated and conducted the "Time-out" before starting the procedure, as per protocol. The patient was asked to participate by confirming the accuracy of the "Time Out" information. Verification of the correct person, site, and procedure were performed and confirmed by me, the nursing staff, and the patient. "Time-out" conducted as per Joint Commission's Universal Protocol (UP.01.01.01). "Time-out" Date & Time: 06/13/2017; 1320 hrs.  Description of Procedure Process:   Position: Prone Target Area: For Lumbar Facet blocks, the target is the groove formed by the junction of the transverse process and superior articular process. For the L5 dorsal ramus, the target is the notch between superior articular process and sacral ala.  Approach: Paramedial approach. Area Prepped: Entire Posterior Lumbosacral Region Prepping solution: ChloraPrep (2% chlorhexidine gluconate and 70% isopropyl alcohol) Safety Precautions: Aspiration looking for blood return was conducted prior to all injections. At no point did we inject any substances, as a needle was being advanced. No attempts were made at seeking any paresthesias. Safe injection practices and needle disposal techniques used. Medications  properly checked for expiration dates. SDV (single dose vial) medications used. Description of the Procedure: Protocol guidelines were followed. The patient was placed in position over the fluoroscopy table. The target area was identified and the area prepped in the usual manner. Skin desensitized using vapocoolant spray. Skin & deeper tissues infiltrated with local anesthetic. Appropriate amount of time allowed to pass for local anesthetics to take effect. The procedure needle was introduced through the skin, ipsilateral to the reported pain, and advanced to the target  area. Employing the "Medial Branch Technique", the needles were advanced to the angle made by the superior and medial portion of the transverse process, and the lateral and inferior portion of the superior articulating process of the targeted vertebral bodies. This area is known as "Burton's Eye" or the "Eye of the [place] Dog". A procedure needle was introduced through the skin, and this time advanced to the angle made by the superior and medial border of the sacral ala, and the lateral border of the S1 vertebral body.  Negative aspiration confirmed. Solution injected in intermittent fashion, asking for systemic symptoms every 0.5cc of injectate. The needles were then removed and the area cleansed, making sure to leave some of the prepping solution back to take advantage of its long term bactericidal properties.   Illustration of the posterior view of the lumbar spine and the posterior neural structures. Laminae of L2 through S1 are labeled. DPRL5, dorsal primary ramus of L5; DPRS1, dorsal primary ramus of S1; DPR3, dorsal primary ramus of L3; FJ, facet (zygapophyseal) joint L3-L4; I, inferior articular process of L4; LB1, lateral branch of dorsal primary ramus of L1; IAB, inferior articular branches from L3 medial branch (supplies L4-L5 facet joint); IBP, intermediate branch plexus; MB3, medial branch of dorsal primary ramus of L3; NR3, third  lumbar nerve root; S, superior articular process of L5; SAB, superior articular branches from L4 (supplies L4-5 facet joint also); TP3, transverse process of L3.  Vitals:   06/13/17 1321 06/13/17 1327 06/13/17 1332 06/13/17 1340  BP: 136/87 125/77 134/76 (!) 162/94  Pulse: 69 62 64 64  Resp: 20 18 16 16   Temp:      SpO2: (!) 88% (!) 89% 93% 92%  Weight:      Height:        Start Time: 1321 hrs. End Time: 1334 hrs. Materials:  Needle(s) Type: Regular needle Gauge: 22G Length: 3.5-in Medication(s): We administered lidocaine (PF) and ropivacaine (PF) 2 mg/mL (0.2%). Please see chart orders for dosing details. 1.5 cc of ropivacaine at each level. Imaging Guidance (Spinal):  Type of Imaging Technique: Fluoroscopy Guidance (Spinal) Indication(s): Assistance in needle guidance and placement for procedures requiring needle placement in or near specific anatomical locations not easily accessible without such assistance. Exposure Time: Please see nurses notes. Contrast: None used. Fluoroscopic Guidance: I was personally present during the use of fluoroscopy. "Tunnel Vision Technique" used to obtain the best possible view of the target area. Parallax error corrected before commencing the procedure. "Direction-depth-direction" technique used to introduce the needle under continuous pulsed fluoroscopy. Once target was reached, antero-posterior, oblique, and lateral fluoroscopic projection used confirm needle placement in all planes. Images permanently stored in EMR. Interpretation: No contrast injected. I personally interpreted the imaging intraoperatively. Adequate needle placement confirmed in multiple planes. Permanent images saved into the patient's record.  Antibiotic Prophylaxis:  Indication(s): None identified Antibiotic given: None  Post-operative Assessment:  EBL: None Complications: No immediate post-treatment complications observed by team, or reported by patient. Note: The patient  tolerated the entire procedure well. A repeat set of vitals were taken after the procedure and the patient was kept under observation following institutional policy, for this type of procedure. Post-procedural neurological assessment was performed, showing return to baseline, prior to discharge. The patient was provided with post-procedure discharge instructions, including a section on how to identify potential problems. Should any problems arise concerning this procedure, the patient was given instructions to immediately contact us, at any time, without hesitation. In any case, we  plan to contact the patient by telephone for a follow-up status report regarding this interventional procedure. Comments:  No additional relevant information. 5 out of 5 strength bilateral lower extremity: Plantar flexion, dorsiflexion, knee flexion, knee extension.  Plan of Care  -Patient stopped Eliquis 5 days ago on October 12. Patient can restart Eliquis tomorrow afternoon. -Patient also endorsing opioid-induced constipation that has been refractory to medications such as Dulcolax and MiraLAX. This has limited his ability to take his hydrocodone. I will prescribe him Movantik 25 mg daily to take prior to his first meal to assist with opioid-induced constipation. -follow-up in 2 weeks for post-procedure evaluation. Can refill hydrocodone at that time too.   Imaging Orders     DG C-Arm 1-60 Min-No Report Procedure Orders    No procedure(s) ordered today    Medications ordered for procedure: Meds ordered this encounter  Medications  . lidocaine (PF) (XYLOCAINE) 1 % injection 10 mL  . ropivacaine (PF) 2 mg/mL (0.2%) (NAROPIN) injection 10 mL  . naloxegol oxalate (MOVANTIK) 25 MG TABS tablet    Sig: Take 1 tablet (25 mg total) by mouth daily. Take on an empty stomach at least 1 hour before or 2 hours after a meal.    Dispense:  30 tablet    Refill:  0    Please instruct the patient not to break the tablet.    Medications administered: We administered lidocaine (PF) and ropivacaine (PF) 2 mg/mL (0.2%).  See the medical record for exact dosing, route, and time of administration.  New Prescriptions   NALOXEGOL OXALATE (MOVANTIK) 25 MG TABS TABLET    Take 1 tablet (25 mg total) by mouth daily. Take on an empty stomach at least 1 hour before or 2 hours after a meal.   Disposition: Discharge home  Discharge Date & Time: 06/13/2017; 1345 hrs.   Physician-requested Follow-up: Return in about 2 weeks (around 06/27/2017) for Post Procedure Evaluation. Future Appointments Date Time Provider [place]  06/21/2017 10:30 AM Deboraha Sprang, MD [facility]  07/09/2017 9:45 AM Eustace Pen, LPN [facility]  07/11/2017 10:30 AM Ria Bush, MD [facility]   Primary Care Physician: Ria Bush, MD Location: [facility] Note by: Gillis Santa, MD Date: 06/13/2017; Time: 1:44 PM  Disclaimer:  Medicine is not an exact science. The only guarantee in medicine is that nothing is guaranteed. It is important to note that the decision to proceed with this intervention was based on the information collected from the patient. The Data and conclusions were drawn from the patient's questionnaire, the interview, and the physical examination. Because the information was provided in large part by the patient, it cannot be guaranteed that it has not been purposely or unconsciously manipulated. Every effort has been made to obtain as much relevant data as possible for this evaluation. It is important to note that the conclusions that lead to this procedure are derived in large part from the available data. Always take into account that the treatment will also be dependent on availability of resources and existing treatment guidelines, considered by other Pain Management Practitioners as being common knowledge and practice, at the time of the  intervention. For Medico-Legal purposes, it is also important to point out that variation in procedural techniques and pharmacological choices are the acceptable norm. The indications, contraindications, technique, and results of the above procedure should only be interpreted and judged by a Board-Certified Interventional Pain Specialist with extensive familiarity and expertise in the same  exact procedure and technique.

## 2017-06-13 NOTE — Progress Notes (Signed)
Safety precautions to be maintained throughout the outpatient stay will include: orient to surroundings, keep bed in low position, maintain call bell within reach at all times, provide assistance with transfer out of bed and ambulation.  

## 2017-06-13 NOTE — Patient Instructions (Addendum)
1. Movantik 25 mg once daily 1 hr prior to first meal or 2 hrs after first meal 2. Follow up in 2 weeks for post procedural evaluation. Can refill Hydrocodone at that time too if need be. 3. Okay to restart Eliquis tomorrow afternoon. 24 hours after procedure.  Pain Management Discharge Instructions  General Discharge Instructions :  If you need to reach your doctor call: Monday-Friday 8:00 am - 4:00 pm at [phone] or toll free [phone].  After clinic hours [phone] to have operator reach doctor.  Bring all of your medication bottles to all your appointments in the pain clinic.  To cancel or reschedule your appointment with Pain Management please remember to call 24 hours in advance to avoid a fee.  Refer to the educational materials which you have been given on: General Risks, I had my Procedure. Discharge Instructions, Post Sedation.  Post Procedure Instructions:   Please notify your doctor immediately if you have any unusual bleeding, trouble breathing or pain that is not related to your normal pain.  Depending on the type of procedure that was done, some parts of your body may feel week and/or numb.  This usually clears up by tonight or the next day.  Walk with the use of an assistive device or accompanied by an adult for the 24 hours.  You may use ice on the affected area for the first 24 hours.  Put ice in a Ziploc bag and cover with a towel and place against area 15 minutes on 15 minutes off.  You may switch to heat after 24 hours.

## 2017-06-14 ENCOUNTER — Telehealth: Payer: Self-pay | Admitting: *Deleted

## 2017-06-14 ENCOUNTER — Other Ambulatory Visit: Payer: Self-pay | Admitting: Student in an Organized Health Care Education/Training Program

## 2017-06-14 ENCOUNTER — Telehealth: Payer: Self-pay | Admitting: Student in an Organized Health Care Education/Training Program

## 2017-06-14 DIAGNOSIS — M47816 Spondylosis without myelopathy or radiculopathy, lumbar region: Secondary | ICD-10-CM

## 2017-06-14 NOTE — Telephone Encounter (Signed)
Spoke with Anthony [name] and explained to her that these dates should work and I would forward to Dr Holley Raring for appropriate orders and then Blanch Media for information re; PA for procedures.  I did explain that we may have to schedule one procedure at at time after having results from prior procedures.  Patient representative verbalizes u/o and is just trying to coordinate Anthony [name] appts around her schedule and other obligations.

## 2017-06-14 NOTE — Telephone Encounter (Signed)
Anthony [name] is contact for getting Anthony [name] to appts. She would like to be able to set up next procedure to be done on the 31st and then come back for the RF on November 12. Would like to get all this set up. Patient is set up for 06-26-17 follow up appt for 06-13-17 procedure appt. Wants to speak with Nurse after they talk to Dr. Holley Raring and get this approved

## 2017-06-14 NOTE — Telephone Encounter (Signed)
No problems post procedure. 

## 2017-06-14 NOTE — Progress Notes (Signed)
Plan for post procedural follow-up next week. We will plan for second diagnostic left-sided facet block on October 31.

## 2017-06-21 ENCOUNTER — Ambulatory Visit (INDEPENDENT_AMBULATORY_CARE_PROVIDER_SITE_OTHER): Payer: Medicare Other | Admitting: Internal Medicine

## 2017-06-21 ENCOUNTER — Ambulatory Visit
Payer: Medicare Other | Attending: Student in an Organized Health Care Education/Training Program | Admitting: Student in an Organized Health Care Education/Training Program

## 2017-06-21 ENCOUNTER — Encounter: Payer: Self-pay | Admitting: Internal Medicine

## 2017-06-21 ENCOUNTER — Encounter: Payer: Self-pay | Admitting: Student in an Organized Health Care Education/Training Program

## 2017-06-21 VITALS — BP 131/82 | HR 58 | Temp 97.8°F | Resp 16 | Ht 76.0 in | Wt 281.0 lb

## 2017-06-21 VITALS — BP 110/62 | HR 53 | Ht 73.0 in | Wt 289.0 lb

## 2017-06-21 DIAGNOSIS — Z9689 Presence of other specified functional implants: Secondary | ICD-10-CM | POA: Diagnosis not present

## 2017-06-21 DIAGNOSIS — M129 Arthropathy, unspecified: Secondary | ICD-10-CM | POA: Insufficient documentation

## 2017-06-21 DIAGNOSIS — G4733 Obstructive sleep apnea (adult) (pediatric): Secondary | ICD-10-CM | POA: Diagnosis not present

## 2017-06-21 DIAGNOSIS — Z7982 Long term (current) use of aspirin: Secondary | ICD-10-CM | POA: Insufficient documentation

## 2017-06-21 DIAGNOSIS — I483 Typical atrial flutter: Secondary | ICD-10-CM | POA: Diagnosis not present

## 2017-06-21 DIAGNOSIS — N400 Enlarged prostate without lower urinary tract symptoms: Secondary | ICD-10-CM | POA: Diagnosis not present

## 2017-06-21 DIAGNOSIS — Z888 Allergy status to other drugs, medicaments and biological substances status: Secondary | ICD-10-CM | POA: Diagnosis not present

## 2017-06-21 DIAGNOSIS — I2510 Atherosclerotic heart disease of native coronary artery without angina pectoris: Secondary | ICD-10-CM

## 2017-06-21 DIAGNOSIS — E669 Obesity, unspecified: Secondary | ICD-10-CM | POA: Insufficient documentation

## 2017-06-21 DIAGNOSIS — Z823 Family history of stroke: Secondary | ICD-10-CM | POA: Diagnosis not present

## 2017-06-21 DIAGNOSIS — Z808 Family history of malignant neoplasm of other organs or systems: Secondary | ICD-10-CM | POA: Insufficient documentation

## 2017-06-21 DIAGNOSIS — I10 Essential (primary) hypertension: Secondary | ICD-10-CM

## 2017-06-21 DIAGNOSIS — Z85820 Personal history of malignant melanoma of skin: Secondary | ICD-10-CM | POA: Diagnosis not present

## 2017-06-21 DIAGNOSIS — Z841 Family history of disorders of kidney and ureter: Secondary | ICD-10-CM | POA: Insufficient documentation

## 2017-06-21 DIAGNOSIS — I509 Heart failure, unspecified: Secondary | ICD-10-CM | POA: Insufficient documentation

## 2017-06-21 DIAGNOSIS — I110 Hypertensive heart disease with heart failure: Secondary | ICD-10-CM | POA: Diagnosis not present

## 2017-06-21 DIAGNOSIS — Z79899 Other long term (current) drug therapy: Secondary | ICD-10-CM | POA: Insufficient documentation

## 2017-06-21 DIAGNOSIS — Z4889 Encounter for other specified surgical aftercare: Secondary | ICD-10-CM | POA: Diagnosis not present

## 2017-06-21 DIAGNOSIS — M545 Low back pain, unspecified: Secondary | ICD-10-CM

## 2017-06-21 DIAGNOSIS — Z6834 Body mass index (BMI) 34.0-34.9, adult: Secondary | ICD-10-CM | POA: Diagnosis not present

## 2017-06-21 DIAGNOSIS — I5030 Unspecified diastolic (congestive) heart failure: Secondary | ICD-10-CM

## 2017-06-21 DIAGNOSIS — M47816 Spondylosis without myelopathy or radiculopathy, lumbar region: Secondary | ICD-10-CM | POA: Diagnosis not present

## 2017-06-21 DIAGNOSIS — M5136 Other intervertebral disc degeneration, lumbar region: Secondary | ICD-10-CM | POA: Diagnosis not present

## 2017-06-21 DIAGNOSIS — G8929 Other chronic pain: Secondary | ICD-10-CM | POA: Insufficient documentation

## 2017-06-21 NOTE — Progress Notes (Signed)
Patient's Name: Anthony [name]  MRN: [id number]  Referring Provider: Ria Bush, MD  DOB: [birth year]  PCP: Ria Bush, MD  DOS: 06/21/2017  Note by: Gillis Santa, MD  Service setting: Ambulatory outpatient  Specialty: Interventional Pain Management  Location: [facility] (AMB) Pain Management Facility    Patient type: Established   Primary Reason(s) for Visit: Encounter for post-procedure evaluation of chronic illness with mild to moderate exacerbation CC: Back Pain (left, lower)  HPI  [name] is a 81 y.o. year old, male patient, who comes today for a post-procedure evaluation. He has History of basal cell carcinoma (BCC); History of squamous cell carcinoma in situ of skin; Hypercholesterolemia with hypertriglyceridemia; Chronic constipation; OSA on CPAP; BPH (benign prostatic hyperplasia); Atrial flutter ([place]); Chronic insomnia; (HFpEF) heart failure with preserved ejection fraction ([place]); Hypertension; Hypothyroidism; Obesity, Class II, BMI 35-39.9, with comorbidity; CAD (coronary artery disease); and Chronic lower back pain on his problem list. His primarily concern today is the Back Pain (left, lower)  Pain Assessment: Location: Left, Lower Back Radiating: back of left leg Onset: More than a month ago Duration: Chronic pain Quality: Aching Severity: 8 /10 (self-reported pain score)  Note: Reported level is compatible with observation.                   When using our objective Pain Scale, levels between 6 and 10/10 are said to belong in an emergency room, as it progressively worsens from a 6/10, described as severely limiting, requiring emergency care not usually available at an outpatient pain management facility. At a 6/10 level, communication becomes difficult and requires great effort. Assistance to reach the emergency department may be required. Facial flushing and profuse sweating along with potentially dangerous increases in heart rate and blood pressure will be  evident. Effect on ADL:   Timing: Intermittent Modifying factors: sitting  [name] comes in today for post-procedure evaluation after the treatment done on 06/14/2017.  Further details on both, my assessment(s), as well as the proposed treatment plan, please see below.  Post-Procedure Assessment  06/14/2017 Procedure: Left L3-L5 diagnostic facet block #1 Pre-procedure pain score:  6/10 Post-procedure pain score: 1/10         Influential Factors: BMI: 34.20 kg/m Intra-procedural challenges: None observed.         Assessment challenges: None detected.              Reported side-effects: None.        Post-procedural adverse reactions or complications: None reported         Sedation: Please see nurses note. When no sedatives are used, the analgesic levels obtained are directly associated to the effectiveness of the local anesthetics. However, when sedation is provided, the level of analgesia obtained during the initial 1 hour following the intervention, is believed to be the result of a combination of factors. These factors may include, but are not limited to: 1. The effectiveness of the local anesthetics used. 2. The effects of the analgesic(s) and/or anxiolytic(s) used. 3. The degree of discomfort experienced by the patient at the time of the procedure. 4. The patients ability and reliability in recalling and recording the events. 5. The presence and influence of possible secondary gains and/or psychosocial factors. Reported result: Relief experienced during the 1st hour after the procedure: 50 % (Ultra-Short Term Relief)            Interpretative annotation: Clinically appropriate result. Analgesia during this period is likely to be Local Anesthetic and/or IV  Sedative (Analgesic/Anxiolytic) related.          Effects of local anesthetic: The analgesic effects attained during this period are directly associated to the localized infiltration of local anesthetics and therefore cary  significant diagnostic value as to the etiological location, or anatomical origin, of the pain. Expected duration of relief is directly dependent on the pharmacodynamics of the local anesthetic used. Long-acting (4-6 hours) anesthetics used.  Reported result: Relief during the next 4 to 6 hour after the procedure: 90 % (Short-Term Relief)            Interpretative annotation: Clinically appropriate result. Analgesia during this period is likely to be Local Anesthetic-related.          Long-term benefit: Defined as the period of time past the expected duration of local anesthetics (1 hour for short-acting and 4-6 hours for long-acting). With the possible exception of prolonged sympathetic blockade from the local anesthetics, benefits during this period are typically attributed to, or associated with, other factors such as analgesic sensory neuropraxia, antiinflammatory effects, or beneficial biochemical changes provided by agents other than the local anesthetics.  Reported result: Extended relief following procedure: 50% (Long-Term Relief)            Interpretative annotation: Clinically appropriate result. Good relief. No permanent benefit expected. Inflammation plays a part in the etiology to the pain.          Current benefits: Defined as reported results that persistent at this point in time.   Analgesia: 25-50 % [name] reports improvement of axial symptoms. Function: Somewhat improved ROM: Somewhat improved Interpretative annotation: Recurrence of symptoms. No permanent benefit expected. Effective diagnostic intervention.          Interpretation: Results would suggest a successful diagnostic intervention.                  Plan:  Proceed with treatment No.: 2  Laboratory Chemistry  Inflammation Markers (CRP: Acute Phase) (ESR: Chronic Phase) No results found for: CRP, ESRSEDRATE               Renal Function Markers Lab Results  Component Value Date   BUN 23 05/03/2017   CREATININE  1.41 (H) 05/03/2017   GFRAA 54 (L) 05/03/2017   GFRNONAA 47 (L) 05/03/2017                 Hepatic Function Markers Lab Results  Component Value Date   AST 23 04/16/2017   ALT 25 04/16/2017   ALBUMIN 3.7 04/16/2017   ALKPHOS 59 04/16/2017                 Electrolytes Lab Results  Component Value Date   NA 139 05/03/2017   K 4.5 05/03/2017   CL 101 05/03/2017   CALCIUM 10.8 (H) 05/03/2017                 Neuropathy Markers No results found for: [id number]               Bone Pathology Markers Lab Results  Component Value Date   ALKPHOS 59 04/16/2017   CALCIUM 10.8 (H) 05/03/2017                 Coagulation Parameters Lab Results  Component Value Date   PLT 272 05/03/2017                 Cardiovascular Markers Lab Results  Component Value Date   HGB 14.4 05/03/2017   HCT 43.3  05/03/2017                 Note: Lab results reviewed.  Recent Diagnostic Imaging Results  DG C-Arm 1-60 Min-No Report Fluoroscopy was utilized by the requesting physician.  No radiographic  interpretation.   Complexity Note: Imaging results reviewed. Results shared with Mr. Arth, using Layman's terms.                         Meds   Current Outpatient Prescriptions:  .  apixaban (ELIQUIS) 5 MG TABS tablet, Take 5 mg by mouth 2 (two) times daily., Disp: , Rfl:  .  aspirin 81 MG EC tablet, Take 81 mg by mouth daily. Swallow whole., Disp: , Rfl:  .  diphenhydramine-acetaminophen (TYLENOL PM EXTRA STRENGTH) 25-500 MG TABS tablet, Take 1 tablet by mouth at bedtime as needed. (Patient taking differently: Take 1 tablet by mouth at bedtime as needed (for sleep.). ), Disp: , Rfl:  .  docusate sodium (COLACE) 100 MG capsule, Take 100 mg by mouth 2 (two) times daily., Disp: , Rfl:  .  Eszopiclone 3 MG TABS, Take 3 mg by mouth at bedtime as needed. Take immediately before bedtime , Disp: , Rfl:  .  ezetimibe (ZETIA) 10 MG tablet, Take 1 tablet (10 mg total) by mouth daily., Disp: 90 tablet,  Rfl: 1 .  fenofibrate 160 MG tablet, Take 1 tablet (160 mg total) by mouth daily., Disp: 90 tablet, Rfl: 1 .  finasteride (PROSCAR) 5 MG tablet, Take 1 tablet (5 mg total) by mouth daily., Disp: 90 tablet, Rfl: 3 .  furosemide (LASIX) 40 MG tablet, Take 1 tablet (40 mg total) by mouth daily. Take extra tablet as needed for weight gain >3lb/day or 5lb/wk, Disp: , Rfl:  .  HYDROcodone-acetaminophen (NORCO/VICODIN) 5-325 MG tablet, Take 1 tablet by mouth every 6 (six) hours as needed for moderate pain., Disp: , Rfl:  .  ketoconazole (NIZORAL) 2 % cream, Apply 1 application topically daily as needed (for dermatitits). , Disp: , Rfl:  .  levothyroxine (SYNTHROID, LEVOTHROID) 200 MCG tablet, Take 1 tablet (200 mcg total) by mouth daily before breakfast., Disp: 90 tablet, Rfl: 3 .  linaclotide (LINZESS) 145 MCG CAPS capsule, Take 1 capsule (145 mcg total) by mouth daily. Take on an empty stomach at least 30 minutes before 1st meal of the day, Disp: 90 capsule, Rfl: 1 .  lisinopril (PRINIVIL,ZESTRIL) 2.5 MG tablet, Take 1 tablet (2.5 mg total) by mouth daily. (Patient taking differently: Take 10 mg by mouth daily. ), Disp: 90 tablet, Rfl: 1 .  metoprolol succinate (TOPROL-XL) 50 MG 24 hr tablet, Take 50 mg by mouth daily. , Disp: , Rfl:  .  montelukast (SINGULAIR) 10 MG tablet, Take 10 mg by mouth at bedtime., Disp: , Rfl:  .  Multiple Vitamin (MULTIVITAMIN) capsule, Take 1 capsule by mouth daily., Disp: , Rfl:  .  multivitamin-lutein (OCUVITE-LUTEIN) CAPS capsule, Take 1 capsule by mouth daily., Disp: , Rfl:  .  naloxegol oxalate (MOVANTIK) 25 MG TABS tablet, Take 1 tablet (25 mg total) by mouth daily. Take on an empty stomach at least 1 hour before or 2 hours after a meal., Disp: 30 tablet, Rfl: 0 .  nitroGLYCERIN (NITROSTAT) 0.4 MG SL tablet, Place 0.4 mg under the tongue every 5 (five) minutes as needed for chest pain., Disp: , Rfl:  .  omega-3 acid ethyl esters (LOVAZA) 1 g capsule, Take 2 capsules (2 g  total)  by mouth 2 (two) times daily., Disp: 360 capsule, Rfl: 3 .  potassium chloride (K-DUR,KLOR-CON) 10 MEQ tablet, Take 1 tablet (10 mEq total) by mouth 2 (two) times daily. (Patient taking differently: Take 10 mEq by mouth daily. ), Disp: 180 tablet, Rfl: 1 .  tamsulosin (FLOMAX) 0.4 MG CAPS capsule, Take 2 capsules (0.8 mg total) by mouth daily after supper., Disp: , Rfl:  .  tiZANidine (ZANAFLEX) 4 MG capsule, Take 1 capsule (4 mg total) by mouth 2 (two) times daily as needed for muscle spasms., Disp: 60 capsule, Rfl: 0  ROS  Constitutional: Denies any fever or chills Gastrointestinal: No reported hemesis, hematochezia, vomiting, or acute GI distress Musculoskeletal: Denies any acute onset joint swelling, redness, loss of ROM, or weakness Neurological: No reported episodes of acute onset apraxia, aphasia, dysarthria, agnosia, amnesia, paralysis, loss of coordination, or loss of consciousness  Allergies  Mr. Keena is allergic to atorvastatin; gemfibrozil; metformin and related; questran [cholestyramine]; and rosuvastatin.  PFSH  Drug: Mr. Michaux  reports that he does not use drugs. Alcohol:  reports that he does not drink alcohol. Tobacco:  reports that he has never smoked. He has never used smokeless tobacco. Medical:  has a past medical history of Anosmia (1980s); Atrial flutter ([place]); CAD (coronary artery disease); Chronic constipation; Chronic insomnia; Chronic lower back pain; Diastolic CHF ([place]); History of diverticulitis; Hyperlipidemia; Hypertension; Hypothyroidism; Obesity, Class II, BMI 35-39.9, with comorbidity; and OSA on CPAP. Surgical: Mr. Francis  has a past surgical history that includes Percutaneous coronary stent intervention (pci-s); Replacement total knee bilateral (Bilateral, 2000s); Spinal cord stimulator implant (06/2016); Nasal sinus surgery; and A-FLUTTER ABLATION (N/A, 05/07/2017). Family: family history includes Kidney disease in his father; Leukemia in his brother;  Stroke in his mother and sister.  Constitutional Exam  General appearance: Well nourished, well developed, and well hydrated. In no apparent acute distress Vitals:   06/21/17 0948  BP: 131/82  Pulse: (!) 58  Resp: 16  Temp: 97.8 F (36.6 C)  TempSrc: Oral  SpO2: 98%  Weight: 281 lb (127.5 kg)  Height: '6\' 4"'  (1.93 m)   BMI Assessment: Estimated body mass index is 34.2 kg/m as calculated from the following:   Height as of this encounter: '6\' 4"'  (1.93 m).   Weight as of this encounter: 281 lb (127.5 kg).  BMI interpretation table: BMI level Category Range association with higher incidence of chronic pain  <18 kg/m2 Underweight   18.5-24.9 kg/m2 Ideal body weight   25-29.9 kg/m2 Overweight Increased incidence by 20%  30-34.9 kg/m2 Obese (Class I) Increased incidence by 68%  35-39.9 kg/m2 Severe obesity (Class II) Increased incidence by 136%  >40 kg/m2 Extreme obesity (Class III) Increased incidence by 254%   BMI Readings from Last 4 Encounters:  06/21/17 34.20 kg/m  06/13/17 36.55 kg/m  05/07/17 37.07 kg/m  05/03/17 38.72 kg/m   Wt Readings from Last 4 Encounters:  06/21/17 281 lb (127.5 kg)  06/13/17 277 lb (125.6 kg)  05/07/17 281 lb (127.5 kg)  05/03/17 285 lb 8 oz (129.5 kg)  Psych/Mental status: Alert, oriented x 3 (person, place, & time)       Eyes: PERLA Respiratory: No evidence of acute respiratory distress  Cervical Spine Area Exam  Skin & Axial Inspection: No masses, redness, edema, swelling, or associated skin lesions Alignment: Symmetrical Functional ROM: Unrestricted ROM      Stability: No instability detected Muscle Tone/Strength: Functionally intact. No obvious neuro-muscular anomalies detected. Sensory (Neurological): Unimpaired Palpation: No palpable anomalies  Upper Extremity (UE) Exam    Side: Right upper extremity  Side: Left upper extremity  Skin & Extremity Inspection: Skin color, temperature, and hair growth are WNL. No  peripheral edema or cyanosis. No masses, redness, swelling, asymmetry, or associated skin lesions. No contractures.  Skin & Extremity Inspection: Skin color, temperature, and hair growth are WNL. No peripheral edema or cyanosis. No masses, redness, swelling, asymmetry, or associated skin lesions. No contractures.  Functional ROM: Unrestricted ROM          Functional ROM: Unrestricted ROM          Muscle Tone/Strength: Functionally intact. No obvious neuro-muscular anomalies detected.  Muscle Tone/Strength: Functionally intact. No obvious neuro-muscular anomalies detected.  Sensory (Neurological): Unimpaired          Sensory (Neurological): Unimpaired          Palpation: No palpable anomalies              Palpation: No palpable anomalies              Specialized Test(s): Deferred         Specialized Test(s): Deferred          Thoracic Spine Area Exam  Skin & Axial Inspection: No masses, redness, or swelling Alignment: Symmetrical Functional ROM: Unrestricted ROM Stability: No instability detected Muscle Tone/Strength: Functionally intact. No obvious neuro-muscular anomalies detected. Sensory (Neurological): Unimpaired Muscle strength & Tone: No palpable anomalies  Lumbar Spine Area Exam  Skin & Axial Inspection: No masses, redness, or swelling Alignment: Symmetrical Functional ROM: Unrestricted ROM      Stability: No instability detected Muscle Tone/Strength: Functionally intact. No obvious neuro-muscular anomalies detected. Sensory (Neurological): Unimpaired Palpation: No palpable anomalies       Provocative Tests: Lumbar Hyperextension and rotation test: Improved after treatment on left        Lumbar Lateral bending test: evaluation deferred today       Patrick's Maneuver: evaluation deferred today                    Gait & Posture Assessment  Ambulation: Unassisted Gait: Relatively normal for age and body habitus Posture: WNL   Lower Extremity Exam    Side: Right lower extremity   Side: Left lower extremity  Skin & Extremity Inspection: Skin color, temperature, and hair growth are WNL. No peripheral edema or cyanosis. No masses, redness, swelling, asymmetry, or associated skin lesions. No contractures.  Skin & Extremity Inspection: Skin color, temperature, and hair growth are WNL. No peripheral edema or cyanosis. No masses, redness, swelling, asymmetry, or associated skin lesions. No contractures.  Functional ROM: Unrestricted ROM          Functional ROM: Unrestricted ROM          Muscle Tone/Strength: Functionally intact. No obvious neuro-muscular anomalies detected.  Muscle Tone/Strength: Functionally intact. No obvious neuro-muscular anomalies detected.  Sensory (Neurological): Unimpaired  Sensory (Neurological): Unimpaired  Palpation: No palpable anomalies  Palpation: No palpable anomalies   Assessment  Primary Diagnosis & Pertinent Problem List: The primary encounter diagnosis was Spondylosis without myelopathy or radiculopathy, lumbar region. Diagnoses of Lumbar facet arthropathy, Chronic left-sided low back pain without sciatica, Spinal cord stimulator status, and Lumbar degenerative disc disease were also pertinent to this visit.  Status Diagnosis  Responding Responding Responding 1. Spondylosis without myelopathy or radiculopathy, lumbar region   2. Lumbar facet arthropathy   3. Chronic left-sided low back pain without sciatica   4. Spinal cord stimulator status  5. Lumbar degenerative disc disease      81 year old male with a history of lumbar spondylosis, lumbar facet arthropathy most pronounced on the left status post left L3/4, L4/5, L5/S1 facet diagnostic nerve block on 06/13/2017. Patient returns today for follow-up. He states that his left low back pain improved by greater than 70% for 48 hours after the block. Patient was able to sleep better and also perform activities of daily living with greater ease. This would suggest a positive diagnostic block and  plan to proceed with diagnostic block #2 followed by radiofrequency ablation. Patient to stop Eliquis 5 days prior to his diagnostic block #2 which is scheduled for 06/27/2017.   Provider-requested follow-up: Return for Post Procedure Evaluation.  Future Appointments Date Time Provider [place]  06/21/2017 10:45 AM Deboraha Sprang, MD [facility]  06/21/2017 12:00 PM Gillis Santa, MD [facility] None  06/26/2017 3:30 PM Ria Bush, MD [facility]  06/27/2017 9:30 AM Gillis Santa, MD [facility] None  07/05/2017 11:45 AM Gillis Santa, MD [facility] None  07/09/2017 8:00 AM Gillis Santa, MD [facility] None  10/11/2017 10:30 AM Eustace Pen, LPN [facility]  01/19/9101 11:30 AM Ria Bush, MD [facility] [place]    Primary Care Physician: Ria Bush, MD Location: [facility] Note by: Gillis Santa, M.D Date: 06/21/2017; Time: 10:07 AM  There are no Patient Instructions on file for this visit.

## 2017-06-21 NOTE — Progress Notes (Signed)
ELECTROPHYSIOLOGY Office NOTE  Patient ID: Anthony [name], MRN: [id number], DOB/AGE: 79/25/1937 79 y.o. Admit date: (Not on file) Date of Consult: 06/21/2017  Primary Physician: Ria Bush, MD Primary Cardiologist: [name]     Anthony [name] is a 81 y.o. male who is being seen today for the evaluation of Atrial flutter at the request of his son [name].    HPI Anthony [name] is a 81 y.o. male is the  father of [name] who is recently moved here from [place].  He has been followed by [facility]. He has a history of coronary artery disease with prior stenting of his LAD and his RCA in 2013.  He is seeing following catheter ablation of atrial flutter.  No recurrent atrial arrhythmias    back remains biggest issue  Chronic edema  But dyspnea is less p ablation   Echocardiogram 6/18 demonstrated normal LV function and normal left atrial size  Thromboembolic risk factors ( age -48, HTN-1 , Vasc disease -1) for a CHADSVASc Score of 4   He has treated sleep apnea.  LDL 105    Past Medical History:  Diagnosis Date  . Anosmia 1980s  . Atrial flutter ([place])   . CAD (coronary artery disease)   . Chronic constipation   . Chronic insomnia   . Chronic lower back pain    s/p spine stimulator placement  . Diastolic CHF ([place])   . History of diverticulitis   . Hyperlipidemia   . Hypertension   . Hypothyroidism   . Obesity, Class II, BMI 35-39.9, with comorbidity   . OSA on CPAP       Surgical History:  Past Surgical History:  Procedure Laterality Date  . A-FLUTTER ABLATION N/A 05/07/2017   Procedure: A-Flutter Ablation;  Surgeon: Deboraha Sprang, MD;  Location: [facility];  Service: Cardiovascular;  Laterality: N/A;  . NASAL SINUS SURGERY     nasal polyps  . PERCUTANEOUS CORONARY STENT INTERVENTION (PCI-S)     stent placement  . REPLACEMENT TOTAL KNEE BILATERAL Bilateral 2000s  . SPINAL CORD STIMULATOR IMPLANT  06/2016     Home  Meds: Prior to Admission medications   Medication Sig Start Date End Date Taking? Authorizing Provider  apixaban (ELIQUIS) 5 MG TABS tablet Take 5 mg by mouth 2 (two) times daily.    [provider]  aspirin 81 MG EC tablet Take 81 mg by mouth daily. Swallow whole.    [provider]  diphenhydramine-acetaminophen (TYLENOL PM EXTRA STRENGTH) 25-500 MG TABS tablet Take 1 tablet by mouth at bedtime as needed. 04/16/17   Ria Bush, MD  docusate sodium (COLACE) 100 MG capsule Take 100 mg by mouth 2 (two) times daily.    [provider]  Eszopiclone 3 MG TABS Take 1 mg by mouth at bedtime as needed. Take immediately before bedtime    [provider]  ezetimibe (ZETIA) 10 MG tablet Take 1 tablet (10 mg total) by mouth daily. 04/16/17   Ria Bush, MD  fenofibrate 160 MG tablet Take 1 tablet (160 mg total) by mouth daily. 04/16/17   Ria Bush, MD  finasteride (PROSCAR) 5 MG tablet Take 5 mg by mouth daily.    [provider]  furosemide (LASIX) 40 MG tablet Take 1 tablet (40 mg total) by mouth daily. Take extra tablet as needed for weight gain >3lb/day or 5lb/wk 04/16/17   Ria Bush, MD  HYDROcodone-acetaminophen (NORCO/VICODIN) [phone]  MG tablet Take 1 tablet by mouth every 6 (six) hours as needed for moderate pain.    [provider]  ketoconazole (NIZORAL) 2 % cream Apply 1 application topically daily.    [provider]  levothyroxine (SYNTHROID, LEVOTHROID) 200 MCG tablet Take 1 tablet (200 mcg total) by mouth daily before breakfast. 04/16/17   Ria Bush, MD  linaclotide [facility]) 145 MCG CAPS capsule Take 1 capsule (145 mcg total) by mouth daily. Take on an empty stomach at least 30 minutes before 1st meal of the day Patient not taking: Reported on 04/24/2017 04/16/17   Ria Bush, MD  lisinopril (PRINIVIL,ZESTRIL) 2.5 MG tablet Take 2.5 mg by mouth daily.    [provider]  metoprolol  succinate (TOPROL-XL) 50 MG 24 hr tablet Take 1 tablet (50 mg total) by mouth 2 (two) times daily. 04/16/17   Ria Bush, MD  montelukast (SINGULAIR) 10 MG tablet Take 10 mg by mouth at bedtime.    [provider]  Multiple Vitamin (MULTIVITAMIN) capsule Take 1 capsule by mouth daily.    [provider]  multivitamin-lutein (OCUVITE-LUTEIN) CAPS capsule Take 1 capsule by mouth daily.    [provider]  nitroGLYCERIN (NITROSTAT) 0.4 MG SL tablet Place 0.4 mg under the tongue every 5 (five) minutes as needed for chest pain.    [provider]  omega-3 acid ethyl esters (LOVAZA) 1 g capsule Take 2 g by mouth 2 (two) times daily.    [provider]  polyethylene glycol (MIRALAX / GLYCOLAX) packet Take 17 g by mouth daily as needed for moderate constipation. Mix with water, juice, coffee or tea 04/16/17   Ria Bush, MD  potassium chloride (K-DUR,KLOR-CON) 10 MEQ tablet Take 10 mEq by mouth 2 (two) times daily.    [provider]  tamsulosin (FLOMAX) 0.4 MG CAPS capsule Take 2 capsules (0.8 mg total) by mouth daily after supper. 04/16/17   Ria Bush, MD  tiZANidine (ZANAFLEX) 4 MG capsule Take 1 capsule (4 mg total) by mouth 2 (two) times daily as needed for muscle spasms. 04/24/17   Gillis Santa, MD  zaleplon (SONATA) 10 MG capsule Take 10 mg by mouth at bedtime as needed for sleep.    [provider]    Allergies:  Allergies  Allergen Reactions  . Atorvastatin     Muscle pain  . Gemfibrozil Other (See Comments)    Muscle pain.  . Metformin And Related     Dizziness  . Questran [Cholestyramine]   . Rosuvastatin     Muscle pain    Social History   Social History  . Marital status: Widowed    Spouse name: N/A  . Number of children: N/A  . Years of education: N/A   Occupational History  . Not on file.   Social History Main Topics  . Smoking status: Never Smoker  . Smokeless tobacco: Never Used  .  Alcohol use No  . Drug use: No  . Sexual activity: No   Other Topics Concern  . Not on file   Social History Narrative   Widower. Lives alone, GF Miss [name] sometimes (she was nurse)   Son is [name] (FP at [employer])   Occ: retired, used to [employer] store   Edu: HS     Family History  Problem Relation Age of Onset  . Stroke Mother   . Kidney disease Father   . Leukemia Brother   . Stroke Sister   .  Diabetes Neg Hx      ROS:  Please see the history of present illness.     All other systems reviewed and negative.    Physical Exam: Blood pressure 110/62, pulse (!) 53, height 6\' 1"  (1.854 m), weight 289 lb (131.1 kg). Well developed and nourished in no acute distress HENT normal Neck supple with JVP-flat Clear Slow but Regular rate and rhythm, no murmurs or gallops Abd-soft with active BS No Clubbing cyanosis tr edema Skin-warm and dry A & Oriented  Grossly normal sensory and motor function     Labs: Cardiac Enzymes No results for input(s): CKTOTAL, CKMB, TROPONINI in the last 72 hours. CBC Lab Results  Component Value Date   WBC 7.2 05/03/2017   HGB 14.4 05/03/2017   HCT 43.3 05/03/2017   MCV 93 05/03/2017   PLT 272 05/03/2017   PROTIME: No results for input(s): LABPROT, INR in the last 72 hours. Chemistry No results for input(s): NA, K, CL, CO2, BUN, CREATININE, CALCIUM, PROT, BILITOT, ALKPHOS, ALT, AST, GLUCOSE in the last 168 hours.  Invalid input(s): LABALBU Lipids No results found for: CHOL, HDL, LDLCALC, TRIG BNP No results found for: PROBNP Thyroid Function Tests: No results for input(s): TSH, T4TOTAL, T3FREE, THYROIDAB in the last 72 hours.  Invalid input(s): FREET3 Miscellaneous No results found for: DDIMER  Radiology/Studies:  Dg C-arm 1-60 Min-no Report  Result Date: 06/13/2017 Fluoroscopy was utilized by the requesting physician.  No radiographic interpretation.    EKG: sinus @ 53 18/10/44 Axis  -30  Assessment and Plan:  Atrial flutter-typical  S/p ablation  HFpEF  Statin intolerance  Coronary artery disease with prior stenting   Will stop apixoban    Continue ASA  Will stop metoprolol with lowish BP and HR  And with only chronic stable CAD  Intolerant of statins will look into canidacy for PCSK9 at age        Virl Axe

## 2017-06-21 NOTE — Progress Notes (Signed)
Safety precautions to be maintained throughout the outpatient stay will include: orient to surroundings, keep bed in low position, maintain call bell within reach at all times, provide assistance with transfer out of bed and ambulation.  

## 2017-06-21 NOTE — Patient Instructions (Signed)
Medication Instructions:  Your physician has recommended you make the following change in your medication:  1- STOP taking Eliquis. 2- Wean off Metoprolol. Take Metoprolol 25 mg (1/2 tablet) by mouth once a day for 1 week, then take 25 mg (1/2 tablet) every other day for 1 week, then STOP.   Labwork: none  Testing/Procedures: none  Follow-Up: Your physician wants you to follow-up in: [place]. You will receive a reminder letter in the mail two months in advance. If you don't receive a letter, please call our office to schedule the follow-up appointment.  If you need a refill on your cardiac medications before your next appointment, please call your pharmacy.

## 2017-06-26 ENCOUNTER — Ambulatory Visit: Payer: Medicare Other | Admitting: Student in an Organized Health Care Education/Training Program

## 2017-06-26 ENCOUNTER — Encounter: Payer: Self-pay | Admitting: Family Medicine

## 2017-06-26 ENCOUNTER — Ambulatory Visit (INDEPENDENT_AMBULATORY_CARE_PROVIDER_SITE_OTHER): Payer: Medicare Other | Admitting: Family Medicine

## 2017-06-26 VITALS — BP 152/92 | HR 55 | Temp 98.0°F | Wt 285.0 lb

## 2017-06-26 DIAGNOSIS — R0981 Nasal congestion: Secondary | ICD-10-CM | POA: Diagnosis not present

## 2017-06-26 DIAGNOSIS — I483 Typical atrial flutter: Secondary | ICD-10-CM | POA: Diagnosis not present

## 2017-06-26 DIAGNOSIS — I10 Essential (primary) hypertension: Secondary | ICD-10-CM | POA: Diagnosis not present

## 2017-06-26 DIAGNOSIS — I2510 Atherosclerotic heart disease of native coronary artery without angina pectoris: Secondary | ICD-10-CM | POA: Diagnosis not present

## 2017-06-26 MED ORDER — LORATADINE 10 MG PO TABS: 10.0000 mg | ORAL_TABLET | Freq: Every day | ORAL | Status: DC

## 2017-06-26 MED ORDER — LISINOPRIL 10 MG PO TABS: 10.0000 mg | ORAL_TABLET | Freq: Every day | ORAL | 3 refills | Status: DC

## 2017-06-26 NOTE — Assessment & Plan Note (Signed)
BP elevation since tapering off metoprolol XL. Better control with lisinopril 10mg  daily, pain affects bp as well. Will continue lisinopril 10mg  daily, with option to increase to 10mg  BID - and advised to let us know if they do increase to send in higher dose.  Check renal panel today as he's been on higher dose of lisinopril for about 1 month.

## 2017-06-26 NOTE — Progress Notes (Signed)
BP (!) 152/92 (BP Location: Right Arm, Cuff Size: Large)   Pulse (!) 55   Temp 98 F (36.7 C) (Oral)   Wt 285 lb (129.3 kg)   SpO2 94%   BMI 37.60 kg/m    CC: review blood pressure regimen Subjective:    Patient ID: Anthony [name], male    DOB: 1936/08/28, 81 y.o.   MRN: [id number]  HPI: Anthony [name] is a 81 y.o. male presenting on 06/26/2017 for Discuss BP meds (Wants to discuss starting ASA, taking Lisinopril 2 tabs BID (will need new rx reflecting this) and weaning off metoprolol. Pt currently taking 1/2 tab of metoprolol (tomorrow last day, then will take 1/2 tab every other day starting next wk). Told he needs to wait 2 wks after ablation to have flu shot. Is this ok?) and Other (Pt had cardiac ablation 04/2017 and pain ablation in his back 06/2017)   Here with friend Volney Presser.   Saw Dr Caryl Comes last week, note reviewed. He had successful cardioversion and ablation for atrial flutter. Stopped eliquis and weaning off metoprolol due to already low BP and HR. However BP shot up to 180/110s so metoprolol was restarted, lisinopril was increased to 10mg  daily.   Currently on lisinopril 10mg  daily. He is also taking lasix 40mg  daily and potassium 64mEq daily. Currently on toprol XL wean 50mg  1/2 tablet daily through this week then QOD for next week then off. No HA, vision changes, CP/tightness, SOB, leg swelling.   Prior he was on lisinopril hctz 20/12.5mg  to help control blood pressures.   Increased post nasal drainage of clear mucous - chronic issue. Ongoing nasal congestion as well. singulair prior didn't help.   Relevant past medical, surgical, family and social history reviewed and updated as indicated. Interim medical history since our last visit reviewed. Allergies and medications reviewed and updated. Outpatient Medications Prior to Visit  Medication Sig Dispense Refill  . diphenhydramine-acetaminophen (TYLENOL PM EXTRA STRENGTH) 25-500 MG TABS tablet Take 1 tablet by mouth at  bedtime as needed. (Patient taking differently: Take 1 tablet by mouth at bedtime as needed (for sleep.). )    . docusate sodium (COLACE) 100 MG capsule Take 100 mg by mouth 2 (two) times daily.    . Eszopiclone 3 MG TABS Take 3 mg by mouth at bedtime as needed. Take immediately before bedtime     . ezetimibe (ZETIA) 10 MG tablet Take 1 tablet (10 mg total) by mouth daily. 90 tablet 1  . fenofibrate 160 MG tablet Take 1 tablet (160 mg total) by mouth daily. 90 tablet 1  . finasteride (PROSCAR) 5 MG tablet Take 1 tablet (5 mg total) by mouth daily. 90 tablet 3  . furosemide (LASIX) 40 MG tablet Take 1 tablet (40 mg total) by mouth daily. Take extra tablet as needed for weight gain >3lb/day or 5lb/wk    . HYDROcodone-acetaminophen (NORCO/VICODIN) 5-325 MG tablet Take 1 tablet by mouth every 6 (six) hours as needed for moderate pain.    Marland Kitchen ketoconazole (NIZORAL) 2 % cream Apply 1 application topically daily as needed (for dermatitits).     Marland Kitchen levothyroxine (SYNTHROID, LEVOTHROID) 200 MCG tablet Take 1 tablet (200 mcg total) by mouth daily before breakfast. 90 tablet 3  . linaclotide (LINZESS) 145 MCG CAPS capsule Take 1 capsule (145 mcg total) by mouth daily. Take on an empty stomach at least 30 minutes before 1st meal of the day 90 capsule 1  . metoprolol succinate (TOPROL-XL) 50 MG 24  hr tablet Take 50 mg by mouth daily.     . Multiple Vitamin (MULTIVITAMIN) capsule Take 1 capsule by mouth daily.    . multivitamin-lutein (OCUVITE-LUTEIN) CAPS capsule Take 1 capsule by mouth daily.    . naloxegol oxalate (MOVANTIK) 25 MG TABS tablet Take 1 tablet (25 mg total) by mouth daily. Take on an empty stomach at least 1 hour before or 2 hours after a meal. 30 tablet 0  . nitroGLYCERIN (NITROSTAT) 0.4 MG SL tablet Place 0.4 mg under the tongue every 5 (five) minutes as needed for chest pain.    Marland Kitchen omega-3 acid ethyl esters (LOVAZA) 1 g capsule Take 2 capsules (2 g total) by mouth 2 (two) times daily. 360 capsule 3    . tamsulosin (FLOMAX) 0.4 MG CAPS capsule Take 2 capsules (0.8 mg total) by mouth daily after supper.    Marland Kitchen tiZANidine (ZANAFLEX) 4 MG capsule Take 1 capsule (4 mg total) by mouth 2 (two) times daily as needed for muscle spasms. 60 capsule 0  . lisinopril (PRINIVIL,ZESTRIL) 2.5 MG tablet Take 2.5 mg by mouth daily.    . potassium chloride (K-DUR,KLOR-CON) 10 MEQ tablet Take 1 tablet (10 mEq total) by mouth 2 (two) times daily. (Patient taking differently: Take 10 mEq by mouth daily. ) 180 tablet 1   No facility-administered medications prior to visit.      Per HPI unless specifically indicated in ROS section below Review of Systems     Objective:    BP (!) 152/92 (BP Location: Right Arm, Cuff Size: Large)   Pulse (!) 55   Temp 98 F (36.7 C) (Oral)   Wt 285 lb (129.3 kg)   SpO2 94%   BMI 37.60 kg/m   Wt Readings from Last 3 Encounters:  06/26/17 285 lb (129.3 kg)  06/21/17 281 lb (127.5 kg)  06/21/17 289 lb (131.1 kg)    Physical Exam  Constitutional: He appears well-developed and well-nourished. No distress.  HENT:  Mouth/Throat: Oropharynx is clear and moist. No oropharyngeal exudate.  Cardiovascular: Normal rate, regular rhythm, normal heart sounds and intact distal pulses.   No murmur heard. Pulmonary/Chest: Effort normal and breath sounds normal. No respiratory distress. He has no wheezes. He has no rales.  Musculoskeletal: He exhibits no edema.  Skin: Skin is warm and dry. No rash noted.  Psychiatric: He has a normal mood and affect.  Nursing note and vitals reviewed.  Results for orders placed or performed in visit on 05/03/17  CBC w/Diff  Result Value Ref Range   WBC 7.2 3.4 - 10.8 x10E3/uL   RBC 4.64 4.14 - 5.80 x10E6/uL   Hemoglobin 14.4 13.0 - 17.7 g/dL   Hematocrit 43.3 37.5 - 51.0 %   MCV 93 79 - 97 fL   MCH 31.0 26.6 - 33.0 pg   MCHC 33.3 31.5 - 35.7 g/dL   RDW 15.0 12.3 - 15.4 %   Platelets 272 150 - 379 x10E3/uL   Neutrophils 39 Not Estab. %    Lymphs 40 Not Estab. %   Monocytes 9 Not Estab. %   Eos 8 Not Estab. %   Basos 3 Not Estab. %   Neutrophils Absolute 2.9 1.4 - 7.0 x10E3/uL   Lymphocytes Absolute 2.9 0.7 - 3.1 x10E3/uL   Monocytes Absolute 0.6 0.1 - 0.9 x10E3/uL   EOS (ABSOLUTE) 0.6 (H) 0.0 - 0.4 x10E3/uL   Basophils Absolute 0.2 0.0 - 0.2 x10E3/uL   Immature Granulocytes 1 Not Estab. %   Immature Grans (  Abs) 0.1 0.0 - 0.1 [id number]  Basic metabolic panel  Result Value Ref Range   Glucose 108 (H) 65 - 99 mg/dL   BUN 23 8 - 27 mg/dL   Creatinine, Ser 1.41 (H) 0.76 - 1.27 mg/dL   GFR calc non Af Amer 47 (L) >59 mL/min/1.73   GFR calc Af Amer 54 (L) >59 mL/min/1.73   BUN/Creatinine Ratio 16 10 - 24   Sodium 139 134 - 144 mmol/L   Potassium 4.5 3.5 - 5.2 mmol/L   Chloride 101 96 - 106 mmol/L   CO2 26 20 - 29 mmol/L   Calcium 10.8 (H) 8.6 - 10.2 mg/dL      Assessment & Plan:  Over 25 minutes were spent face-to-face with the patient during this encounter and >50% of that time was spent on counseling and coordination of care  Problem List Items Addressed This Visit    Atrial flutter ([place])    S/p successful cardioversion last month by cards. Now has been taken off eliquis and tapering off metoprolol. Continue aspirin 81mg  daily.       Relevant Medications   aspirin 81 MG tablet   lisinopril (PRINIVIL,ZESTRIL) 10 MG tablet   Hypertension - Primary    BP elevation since tapering off metoprolol XL. Better control with lisinopril 10mg  daily, pain affects bp as well. Will continue lisinopril 10mg  daily, with option to increase to 10mg  BID - and advised to let us know if they do increase to send in higher dose.  Check renal panel today as he's been on higher dose of lisinopril for about 1 month.       Relevant Medications   aspirin 81 MG tablet   lisinopril (PRINIVIL,ZESTRIL) 10 MG tablet   Other Relevant Orders   Renal function panel   Nasal congestion    Trial claritin daily. Consider nasal steroid           Follow up plan: Return if symptoms worsen or fail to improve.  Ria Bush, MD

## 2017-06-26 NOTE — Patient Instructions (Addendum)
Schedule flu clinic visit for later in the month.  Continue lisinopril 10mg  daily. If persistently elevated systolic blood pressure >151, increase lisinopril to 10mg  twice daily and let me know.  Trial claritin 10mg  daily for ongoing drainage.  Let us know how blood pressures are running with above.

## 2017-06-26 NOTE — Assessment & Plan Note (Signed)
S/p successful cardioversion last month by cards. Now has been taken off eliquis and tapering off metoprolol. Continue aspirin 81mg  daily.

## 2017-06-26 NOTE — Assessment & Plan Note (Addendum)
Trial claritin daily. Consider nasal steroid

## 2017-06-27 ENCOUNTER — Encounter: Payer: Self-pay | Admitting: Student in an Organized Health Care Education/Training Program

## 2017-06-27 ENCOUNTER — Ambulatory Visit
Admission: RE | Admit: 2017-06-27 | Discharge: 2017-06-27 | Disposition: A | Discharge status: Home / Self Care | Payer: Medicare Other | Source: Ambulatory Visit | Attending: Student in an Organized Health Care Education/Training Program | Admitting: Student in an Organized Health Care Education/Training Program

## 2017-06-27 ENCOUNTER — Ambulatory Visit (HOSPITAL_BASED_OUTPATIENT_CLINIC_OR_DEPARTMENT_OTHER): Payer: Medicare Other | Admitting: Student in an Organized Health Care Education/Training Program

## 2017-06-27 VITALS — BP 122/70 | HR 68 | Temp 98.0°F | Resp 18 | Ht 73.0 in | Wt 280.0 lb

## 2017-06-27 DIAGNOSIS — M79605 Pain in left leg: Secondary | ICD-10-CM | POA: Diagnosis not present

## 2017-06-27 DIAGNOSIS — Z888 Allergy status to other drugs, medicaments and biological substances status: Secondary | ICD-10-CM | POA: Insufficient documentation

## 2017-06-27 DIAGNOSIS — M47816 Spondylosis without myelopathy or radiculopathy, lumbar region: Secondary | ICD-10-CM | POA: Insufficient documentation

## 2017-06-27 DIAGNOSIS — M545 Low back pain, unspecified: Secondary | ICD-10-CM

## 2017-06-27 DIAGNOSIS — Z955 Presence of coronary angioplasty implant and graft: Secondary | ICD-10-CM | POA: Diagnosis not present

## 2017-06-27 DIAGNOSIS — Z9889 Other specified postprocedural states: Secondary | ICD-10-CM | POA: Insufficient documentation

## 2017-06-27 DIAGNOSIS — Z9689 Presence of other specified functional implants: Secondary | ICD-10-CM | POA: Diagnosis not present

## 2017-06-27 DIAGNOSIS — M5136 Other intervertebral disc degeneration, lumbar region: Secondary | ICD-10-CM | POA: Insufficient documentation

## 2017-06-27 DIAGNOSIS — G8929 Other chronic pain: Secondary | ICD-10-CM

## 2017-06-27 LAB — RENAL FUNCTION PANEL
Albumin: 4.3 g/dL (ref 3.5–5.2)
BUN: 21 mg/dL (ref 6–23)
CALCIUM: 10.6 mg/dL — AB (ref 8.4–10.5)
CO2: 31 meq/L (ref 19–32)
CREATININE: 1.23 mg/dL (ref 0.40–1.50)
Chloride: 102 mEq/L (ref 96–112)
GFR: 60.22 mL/min (ref >60.00)
GLUCOSE: 111 mg/dL — AB (ref 70–99)
PHOSPHORUS: 3 mg/dL (ref 2.3–4.6)
POTASSIUM: 4.1 meq/L (ref 3.5–5.1)
Sodium: 138 mEq/L (ref 135–145)

## 2017-06-27 MED ORDER — LIDOCAINE HCL (PF) 1 % IJ SOLN
10.0000 mL | Freq: Once | INTRAMUSCULAR | Status: AC
  Administered 2017-06-27: 5 mL
  Filled 2017-06-27: qty 10

## 2017-06-27 MED ORDER — ROPIVACAINE HCL 2 MG/ML IJ SOLN
10.0000 mL | Freq: Once | INTRAMUSCULAR | Status: AC
  Administered 2017-06-27: 10 mL
  Filled 2017-06-27: qty 10

## 2017-06-27 NOTE — Progress Notes (Signed)
Patient's Name: Anthony [name]  MRN: [id number]  Referring Provider: Ria Bush, MD  DOB: 1936-05-20  PCP: Ria Bush, MD  DOS: 06/27/2017  Note by: Gillis Santa, MD  Service setting: Ambulatory outpatient  Specialty: Interventional Pain Management  Patient type: Established  Location: [facility] (AMB) Pain Management Facility  Visit type: Interventional Procedure   Primary Reason for Visit: Interventional Pain Management Treatment. CC: Back Pain (lower) and Leg Pain (left "in the muscle")  Procedure:  Anesthesia, Analgesia, Anxiolysis:  Type: Diagnostic Medial Branch Facet Block # 2 Region: Lumbar Level: L3, L4, L5, Medial Branch Level(s) Laterality: Left  Type: Local Anesthesia Local Anesthetic: Lidocaine 1% Route: Infiltration (Center/IM) IV Access: Declined Sedation: Meaningful verbal contact was maintained at all times during the procedure  Indication(s): Analgesia and Anxiety   Indications: 1. Spondylosis without myelopathy or radiculopathy, lumbar region   2. Lumbar facet arthropathy   3. Chronic left-sided low back pain without sciatica   4. Lumbar degenerative disc disease    Pain Score: Pre-procedure: 8 /10 Post-procedure: 2 /10  Pre-op Assessment:  Anthony [name] is a 81 y.o. (year old), male patient, seen today for interventional treatment. He  has a past surgical history that includes Percutaneous coronary stent intervention (pci-s); Replacement total knee bilateral (Bilateral, 2000s); Spinal cord stimulator implant (06/2016); Nasal sinus surgery; and A-FLUTTER ABLATION (N/A, 05/07/2017). Anthony [name] has a current medication list which includes the following prescription(s): aspirin, diphenhydramine-acetaminophen, docusate sodium, eszopiclone, ezetimibe, fenofibrate, finasteride, furosemide, hydrocodone-acetaminophen, ketoconazole, levothyroxine, linaclotide, lisinopril, loratadine, metoprolol succinate, multivitamin, multivitamin-lutein, naloxegol oxalate, nitroglycerin,  omega-3 acid ethyl esters, potassium chloride, tamsulosin, and tizanidine. His primarily concern today is the Back Pain (lower) and Leg Pain (left "in the muscle")  Patient stopped Eliquis 5 days, will restart tomorrow.  Initial Vital Signs: There were no vitals taken for this visit. BMI: Estimated body mass index is 36.94 kg/m as calculated from the following:   Height as of this encounter: 6\' 1"  (1.854 m).   Weight as of this encounter: 280 lb (127 kg).  Risk Assessment: Allergies: Reviewed. He is allergic to atorvastatin; gemfibrozil; metformin and related; questran [cholestyramine]; and rosuvastatin.  Allergy Precautions: None required Coagulopathies: Reviewed. None identified.  Blood-thinner therapy: None at this time Active Infection(s): Reviewed. None identified. Anthony [name] is afebrile  Site Confirmation: Anthony [name] was asked to confirm the procedure and laterality before marking the site Procedure checklist: Completed Consent: Before the procedure and under the influence of no sedative(s), amnesic(s), or anxiolytics, the patient was informed of the treatment options, risks and possible complications. To fulfill our ethical and legal obligations, as recommended by the American Medical Association's Code of Ethics, I have informed the patient of my clinical impression; the nature and purpose of the treatment or procedure; the risks, benefits, and possible complications of the intervention; the alternatives, including doing nothing; the risk(s) and benefit(s) of the alternative treatment(s) or procedure(s); and the risk(s) and benefit(s) of doing nothing. The patient was provided information about the general risks and possible complications associated with the procedure. These may include, but are not limited to: failure to achieve desired goals, infection, bleeding, organ or nerve damage, allergic reactions, paralysis, and death. In addition, the patient was informed of those risks and  complications associated to Spine-related procedures, such as failure to decrease pain; infection (i.e.: Meningitis, epidural or intraspinal abscess); bleeding (i.e.: epidural hematoma, subarachnoid hemorrhage, or any other type of intraspinal or peri-dural bleeding); organ or nerve damage (i.e.: Any type of peripheral nerve, nerve root,  or spinal cord injury) with subsequent damage to sensory, motor, and/or autonomic systems, resulting in permanent pain, numbness, and/or weakness of one or several areas of the body; allergic reactions; (i.e.: anaphylactic reaction); and/or death. Furthermore, the patient was informed of those risks and complications associated with the medications. These include, but are not limited to: allergic reactions (i.e.: anaphylactic or anaphylactoid reaction(s)); adrenal axis suppression; blood sugar elevation that in diabetics may result in ketoacidosis or comma; water retention that in patients with history of congestive heart failure may result in shortness of breath, pulmonary edema, and decompensation with resultant heart failure; weight gain; swelling or edema; medication-induced neural toxicity; particulate matter embolism and blood vessel occlusion with resultant organ, and/or nervous system infarction; and/or aseptic necrosis of one or more joints. Finally, the patient was informed that Medicine is not an exact science; therefore, there is also the possibility of unforeseen or unpredictable risks and/or possible complications that may result in a catastrophic outcome. The patient indicated having understood very clearly. We have given the patient no guarantees and we have made no promises. Enough time was given to the patient to ask questions, all of which were answered to the patient's satisfaction. Anthony [name] has indicated that he wanted to continue with the procedure. Attestation: I, the ordering provider, attest that I have discussed with the patient the benefits, risks,  side-effects, alternatives, likelihood of achieving goals, and potential problems during recovery for the procedure that I have provided informed consent. Date: 06/27/2017; Time: 12:32 PM  Pre-Procedure Preparation:  Monitoring: As per clinic protocol. Respiration, ETCO2, SpO2, BP, heart rate and rhythm monitor placed and checked for adequate function Safety Precautions: Patient was assessed for positional comfort and pressure points before starting the procedure. Time-out: I initiated and conducted the "Time-out" before starting the procedure, as per protocol. The patient was asked to participate by confirming the accuracy of the "Time Out" information. Verification of the correct person, site, and procedure were performed and confirmed by me, the nursing staff, and the patient. "Time-out" conducted as per Joint Commission's Universal Protocol (UP.01.01.01). "Time-out" Date & Time: 06/27/2017; 1020 hrs.  Description of Procedure Process:   Position: Prone Target Area: For Lumbar Facet blocks, the target is the groove formed by the junction of the transverse process and superior articular process. For the L5 dorsal ramus, the target is the notch between superior articular process and sacral ala.  Approach: Paramedial approach. Area Prepped: Entire Posterior Lumbosacral Region Prepping solution: ChloraPrep (2% chlorhexidine gluconate and 70% isopropyl alcohol) Safety Precautions: Aspiration looking for blood return was conducted prior to all injections. At no point did we inject any substances, as a needle was being advanced. No attempts were made at seeking any paresthesias. Safe injection practices and needle disposal techniques used. Medications properly checked for expiration dates. SDV (single dose vial) medications used. Description of the Procedure: Protocol guidelines were followed. The patient was placed in position over the fluoroscopy table. The target area was identified and the area  prepped in the usual manner. Skin desensitized using vapocoolant spray. Skin & deeper tissues infiltrated with local anesthetic. Appropriate amount of time allowed to pass for local anesthetics to take effect. The procedure needle was introduced through the skin, ipsilateral to the reported pain, and advanced to the target area. Employing the "Medial Branch Technique", the needles were advanced to the angle made by the superior and medial portion of the transverse process, and the lateral and inferior portion of the superior articulating process of the  targeted vertebral bodies. This area is known as "Burton's Eye" or the "Eye of the [place] Dog". A procedure needle was introduced through the skin, and this time advanced to the angle made by the superior and medial border of the sacral ala, and the lateral border of the S1 vertebral body.  Negative aspiration confirmed. Solution injected in intermittent fashion, asking for systemic symptoms every 0.5cc of injectate. The needles were then removed and the area cleansed, making sure to leave some of the prepping solution back to take advantage of its long term bactericidal properties.   Illustration of the posterior view of the lumbar spine and the posterior neural structures. Laminae of L2 through S1 are labeled. DPRL5, dorsal primary ramus of L5; DPRS1, dorsal primary ramus of S1; DPR3, dorsal primary ramus of L3; FJ, facet (zygapophyseal) joint L3-L4; I, inferior articular process of L4; LB1, lateral branch of dorsal primary ramus of L1; IAB, inferior articular branches from L3 medial branch (supplies L4-L5 facet joint); IBP, intermediate branch plexus; MB3, medial branch of dorsal primary ramus of L3; NR3, third lumbar nerve root; S, superior articular process of L5; SAB, superior articular branches from L4 (supplies L4-5 facet joint also); TP3, transverse process of L3.  Vitals:   06/27/17 0923 06/27/17 1023 06/27/17 1028 06/27/17 1033  BP: 132/78 122/71  128/83 122/70  Pulse: 61 71 68 68  Resp: 18 14 18 18   Temp: 98 F (36.7 C)     TempSrc: Oral     SpO2: 95% 97% 96% 98%  Weight: 280 lb (127 kg)     Height: 6\' 1"  (1.854 m)       Start Time: 1021 hrs. End Time: 1031 hrs. Materials:  Needle(s) Type: Regular needle Gauge: 22G Length: 3.5-in Medication(s): We administered lidocaine (PF) and ropivacaine (PF) 2 mg/mL (0.2%). Please see chart orders for dosing details. 1.5 cc of ropivacaine at each level. Imaging Guidance (Spinal):  Type of Imaging Technique: Fluoroscopy Guidance (Spinal) Indication(s): Assistance in needle guidance and placement for procedures requiring needle placement in or near specific anatomical locations not easily accessible without such assistance. Exposure Time: Please see nurses notes. Contrast: None used. Fluoroscopic Guidance: I was personally present during the use of fluoroscopy. "Tunnel Vision Technique" used to obtain the best possible view of the target area. Parallax error corrected before commencing the procedure. "Direction-depth-direction" technique used to introduce the needle under continuous pulsed fluoroscopy. Once target was reached, antero-posterior, oblique, and lateral fluoroscopic projection used confirm needle placement in all planes. Images permanently stored in EMR. Interpretation: No contrast injected. I personally interpreted the imaging intraoperatively. Adequate needle placement confirmed in multiple planes. Permanent images saved into the patient's record.  Antibiotic Prophylaxis:  Indication(s): None identified Antibiotic given: None  Post-operative Assessment:  EBL: None Complications: No immediate post-treatment complications observed by team, or reported by patient. Note: The patient tolerated the entire procedure well. A repeat set of vitals were taken after the procedure and the patient was kept under observation following institutional policy, for this type of procedure.  Post-procedural neurological assessment was performed, showing return to baseline, prior to discharge. The patient was provided with post-procedure discharge instructions, including a section on how to identify potential problems. Should any problems arise concerning this procedure, the patient was given instructions to immediately contact us, at any time, without hesitation. In any case, we plan to contact the patient by telephone for a follow-up status report regarding this interventional procedure. Comments:  No additional relevant information. 5 out of  5 strength bilateral lower extremity: Plantar flexion, dorsiflexion, knee flexion, knee extension.  Plan of Care  -Patient stopped Eliquis 5 days ago, can restart Eliquis tomorrow afternoon. -Follow-up November 8 for postprocedural evaluation followed by radiofrequency ablation.   Imaging Orders     DG C-Arm 1-60 Min-No Report Procedure Orders    No procedure(s) ordered today    Medications ordered for procedure: Meds ordered this encounter  Medications  . lidocaine (PF) (XYLOCAINE) 1 % injection 10 mL  . ropivacaine (PF) 2 mg/mL (0.2%) (NAROPIN) injection 10 mL   Medications administered: We administered lidocaine (PF) and ropivacaine (PF) 2 mg/mL (0.2%).  See the medical record for exact dosing, route, and time of administration.  New Prescriptions   No medications on file   Disposition: Discharge home  Discharge Date & Time: 06/27/2017; 1036 hrs.   Physician-requested Follow-up: No Follow-up on file. Future Appointments Date Time Provider [place]  07/05/2017 11:45 AM Gillis Santa, MD [facility] None  07/09/2017 8:00 AM Gillis Santa, MD [facility] None  07/24/2017 2:00 PM [facility]  10/11/2017 10:30 AM Eustace Pen, LPN [facility]  08/29/5850 11:30 AM Ria Bush, MD [facility]   Primary Care Physician: Ria Bush, MD Location: [facility] [place] [facility]  Facility Note by: Gillis Santa, MD Date: 06/27/2017; Time: 11:23 AM  Disclaimer:  Medicine is not an exact science. The only guarantee in medicine is that nothing is guaranteed. It is important to note that the decision to proceed with this intervention was based on the information collected from the patient. The Data and conclusions were drawn from the patient's questionnaire, the interview, and the physical examination. Because the information was provided in large part by the patient, it cannot be guaranteed that it has not been purposely or unconsciously manipulated. Every effort has been made to obtain as much relevant data as possible for this evaluation. It is important to note that the conclusions that lead to this procedure are derived in large part from the available data. Always take into account that the treatment will also be dependent on availability of resources and existing treatment guidelines, considered by other Pain Management Practitioners as being common knowledge and practice, at the time of the intervention. For Medico-Legal purposes, it is also important to point out that variation in procedural techniques and pharmacological choices are the acceptable norm. The indications, contraindications, technique, and results of the above procedure should only be interpreted and judged by a Board-Certified Interventional Pain Specialist with extensive familiarity and expertise in the same exact procedure and technique.

## 2017-06-27 NOTE — Progress Notes (Signed)
Safety precautions to be maintained throughout the outpatient stay will include: orient to surroundings, keep bed in low position, maintain call bell within reach at all times, provide assistance with transfer out of bed and ambulation.  

## 2017-06-27 NOTE — Patient Instructions (Signed)
Pain Management Discharge Instructions  General Discharge Instructions :  If you need to reach your doctor call: Monday-Friday 8:00 am - 4:00 pm at [phone] or toll free [phone].  After clinic hours [phone] to have operator reach doctor.  Bring all of your medication bottles to all your appointments in the pain clinic.  To cancel or reschedule your appointment with Pain Management please remember to call 24 hours in advance to avoid a fee.  Refer to the educational materials which you have been given on: General Risks, I had my Procedure. Discharge Instructions, Post Sedation.  Post Procedure Instructions:  Please notify your doctor immediately if you have any unusual bleeding, trouble breathing or pain that is not related to your normal pain.  Depending on the type of procedure that was done, some parts of your body may feel week and/or numb.  This usually clears up by tonight or the next day.  Walk with the use of an assistive device or accompanied by an adult for the 24 hours.  You may use ice on the affected area for the first 24 hours.  Put ice in a Ziploc bag and cover with a towel and place against area 15 minutes on 15 minutes off.  You may switch to heat after 24 hours. 

## 2017-06-28 ENCOUNTER — Telehealth: Payer: Self-pay | Admitting: *Deleted

## 2017-06-28 HISTORY — PX: RADIOFREQUENCY ABLATION: SHX2290

## 2017-06-28 NOTE — Telephone Encounter (Signed)
Spoke with patient's son, states that his father was doing well last evening.  I asked him to please let him know that we have called to check on him post procedure and if he has any questions or concerns to please call our office.

## 2017-07-02 ENCOUNTER — Ambulatory Visit: Payer: Medicare Other | Admitting: Student in an Organized Health Care Education/Training Program

## 2017-07-05 ENCOUNTER — Encounter: Payer: Self-pay | Admitting: Student in an Organized Health Care Education/Training Program

## 2017-07-05 ENCOUNTER — Ambulatory Visit
Payer: Medicare Other | Attending: Student in an Organized Health Care Education/Training Program | Admitting: Student in an Organized Health Care Education/Training Program

## 2017-07-05 VITALS — BP 135/60 | HR 88 | Temp 98.3°F | Resp 18 | Ht 73.0 in | Wt 281.0 lb

## 2017-07-05 DIAGNOSIS — M545 Low back pain, unspecified: Secondary | ICD-10-CM

## 2017-07-05 DIAGNOSIS — G4733 Obstructive sleep apnea (adult) (pediatric): Secondary | ICD-10-CM | POA: Diagnosis not present

## 2017-07-05 DIAGNOSIS — E039 Hypothyroidism, unspecified: Secondary | ICD-10-CM | POA: Diagnosis not present

## 2017-07-05 DIAGNOSIS — I110 Hypertensive heart disease with heart failure: Secondary | ICD-10-CM | POA: Insufficient documentation

## 2017-07-05 DIAGNOSIS — K5909 Other constipation: Secondary | ICD-10-CM | POA: Insufficient documentation

## 2017-07-05 DIAGNOSIS — G8929 Other chronic pain: Secondary | ICD-10-CM | POA: Diagnosis not present

## 2017-07-05 DIAGNOSIS — M5136 Other intervertebral disc degeneration, lumbar region: Secondary | ICD-10-CM | POA: Insufficient documentation

## 2017-07-05 DIAGNOSIS — M1288 Other specific arthropathies, not elsewhere classified, other specified site: Secondary | ICD-10-CM | POA: Insufficient documentation

## 2017-07-05 DIAGNOSIS — I2510 Atherosclerotic heart disease of native coronary artery without angina pectoris: Secondary | ICD-10-CM | POA: Insufficient documentation

## 2017-07-05 DIAGNOSIS — M47816 Spondylosis without myelopathy or radiculopathy, lumbar region: Secondary | ICD-10-CM | POA: Diagnosis not present

## 2017-07-05 DIAGNOSIS — E785 Hyperlipidemia, unspecified: Secondary | ICD-10-CM | POA: Insufficient documentation

## 2017-07-05 DIAGNOSIS — Z7982 Long term (current) use of aspirin: Secondary | ICD-10-CM | POA: Diagnosis not present

## 2017-07-05 DIAGNOSIS — Z79899 Other long term (current) drug therapy: Secondary | ICD-10-CM | POA: Insufficient documentation

## 2017-07-05 DIAGNOSIS — I5032 Chronic diastolic (congestive) heart failure: Secondary | ICD-10-CM | POA: Insufficient documentation

## 2017-07-05 DIAGNOSIS — E669 Obesity, unspecified: Secondary | ICD-10-CM | POA: Diagnosis not present

## 2017-07-05 DIAGNOSIS — Z6837 Body mass index (BMI) 37.0-37.9, adult: Secondary | ICD-10-CM | POA: Insufficient documentation

## 2017-07-05 NOTE — Progress Notes (Signed)
Patient's Name: Anthony [name]  MRN: [id number]  Referring Provider: Ria Bush, MD  DOB: [birth year]  PCP: Ria Bush, MD  DOS: 07/05/2017  Note by: Gillis Santa, MD  Service setting: Ambulatory outpatient  Specialty: Interventional Pain Management  Location: [facility] (AMB) Pain Management Facility    Patient type: Established   Primary Reason(s) for Visit: Encounter for post-procedure evaluation of chronic illness with mild to moderate exacerbation CC: Back Pain (lower, left and right)  HPI  Anthony [name] is a 81 y.o. year old, male patient, who comes today for a post-procedure evaluation. He has History of basal cell carcinoma (BCC); History of squamous cell carcinoma in situ of skin; Hypercholesterolemia with hypertriglyceridemia; Chronic constipation; OSA on CPAP; BPH (benign prostatic hyperplasia); Atrial flutter ([place]); Chronic insomnia; (HFpEF) heart failure with preserved ejection fraction ([place]); Hypertension; Hypothyroidism; Obesity, Class II, BMI 35-39.9, with comorbidity; CAD (coronary artery disease); Chronic lower back pain; and Nasal congestion on their problem list. His primarily concern today is the Back Pain (lower, left and right)  Pain Assessment: Location: Left, Right Back Radiating: back of left upper leg Onset: More than a month ago Duration: Chronic pain Quality: Dull, Aching Severity: 5 /10 (self-reported pain score)  Note: Reported level is compatible with observation.                   When using our objective Pain Scale, levels between 6 and 10/10 are said to belong in an emergency room, as it progressively worsens from a 6/10, described as severely limiting, requiring emergency care not usually available at an outpatient pain management facility. At a 6/10 level, communication becomes difficult and requires great effort. Assistance to reach the emergency department may be required. Facial flushing and profuse sweating along with potentially dangerous increases in  heart rate and blood pressure will be evident. Effect on ADL:   Timing: Constant Modifying factors: sitting, lying down  Anthony [name] comes in today for post-procedure evaluation after the treatment done on 06/14/2017.  Further details on both, my assessment(s), as well as the proposed treatment plan, please see below.  Post-Procedure Assessment  06/27/2017 Procedure: Left L3-L5 diagnostic facet block #2 Pre-procedure pain score:  8/10 Post-procedure pain score: 2/10         Influential Factors: BMI: 37.07 kg/m Intra-procedural challenges: None observed.         Assessment challenges: None detected.              Reported side-effects: None.        Post-procedural adverse reactions or complications: None reported         Sedation: Please see nurses note. When no sedatives are used, the analgesic levels obtained are directly associated to the effectiveness of the local anesthetics. However, when sedation is provided, the level of analgesia obtained during the initial 1 hour following the intervention, is believed to be the result of a combination of factors. These factors may include, but are not limited to: 1. The effectiveness of the local anesthetics used. 2. The effects of the analgesic(s) and/or anxiolytic(s) used. 3. The degree of discomfort experienced by the patient at the time of the procedure. 4. The patients ability and reliability in recalling and recording the events. 5. The presence and influence of possible secondary gains and/or psychosocial factors. Reported result: Relief experienced during the 1st hour after the procedure: 70 % (Ultra-Short Term Relief)            Interpretative annotation: Clinically appropriate result. Analgesia during  this period is likely to be Local Anesthetic and/or IV Sedative (Analgesic/Anxiolytic) related.          Effects of local anesthetic: The analgesic effects attained during this period are directly associated to the localized infiltration  of local anesthetics and therefore cary significant diagnostic value as to the etiological location, or anatomical origin, of the pain. Expected duration of relief is directly dependent on the pharmacodynamics of the local anesthetic used. Long-acting (4-6 hours) anesthetics used.  Reported result: Relief during the next 4 to 6 hour after the procedure: 50 % (Short-Term Relief)            Interpretative annotation: Clinically appropriate result. Analgesia during this period is likely to be Local Anesthetic-related.          Long-term benefit: Defined as the period of time past the expected duration of local anesthetics (1 hour for short-acting and 4-6 hours for long-acting). With the possible exception of prolonged sympathetic blockade from the local anesthetics, benefits during this period are typically attributed to, or associated with, other factors such as analgesic sensory neuropraxia, antiinflammatory effects, or beneficial biochemical changes provided by agents other than the local anesthetics.  Reported result: Extended relief following procedure: 50% (Long-Term Relief)            Interpretative annotation: Clinically appropriate result. Good relief. No permanent benefit expected. Inflammation plays a part in the etiology to the pain.          Current benefits: Defined as reported results that persistent at this point in time.   Analgesia: 25-50 % Anthony [name] reports improvement of axial symptoms. Function: Somewhat improved ROM: Somewhat improved Interpretative annotation: Recurrence of symptoms. No permanent benefit expected. Effective diagnostic intervention.          Interpretation: Results would suggest a successful diagnostic intervention.                  Plan:  Proceed with Radiofrequency Ablation for the purpose of attaining long-term benefits. on left  Laboratory Chemistry  Inflammation Markers (CRP: Acute Phase) (ESR: Chronic Phase) No results found for: CRP, ESRSEDRATE                Renal Function Markers Lab Results  Component Value Date   BUN 21 06/26/2017   CREATININE 1.23 06/26/2017   GFRAA 54 (L) 05/03/2017   GFRNONAA 47 (L) 05/03/2017                 Hepatic Function Markers Lab Results  Component Value Date   AST 23 04/16/2017   ALT 25 04/16/2017   ALBUMIN 4.3 06/26/2017   ALKPHOS 59 04/16/2017                 Electrolytes Lab Results  Component Value Date   NA 138 06/26/2017   K 4.1 06/26/2017   CL 102 06/26/2017   CALCIUM 10.6 (H) 06/26/2017                 Neuropathy Markers No results found for: [id number]               Bone Pathology Markers Lab Results  Component Value Date   ALKPHOS 59 04/16/2017   CALCIUM 10.6 (H) 06/26/2017                 Coagulation Parameters Lab Results  Component Value Date   PLT 272 05/03/2017  Cardiovascular Markers Lab Results  Component Value Date   HGB 14.4 05/03/2017   HCT 43.3 05/03/2017                 Note: Lab results reviewed.  Recent Diagnostic Imaging Results  DG C-Arm 1-60 Min-No Report Fluoroscopy was utilized by the requesting physician.  No radiographic  interpretation.   Complexity Note: Imaging results reviewed. Results shared with Anthony [name], using Layman's terms.                         Meds   Current Outpatient Medications:  .  aspirin 81 MG tablet, Take 81 mg by mouth daily., Disp: , Rfl:  .  diphenhydramine-acetaminophen (TYLENOL PM EXTRA STRENGTH) 25-500 MG TABS tablet, Take 1 tablet by mouth at bedtime as needed. (Patient taking differently: Take 1 tablet by mouth at bedtime as needed (for sleep.). ), Disp: , Rfl:  .  docusate sodium (COLACE) 100 MG capsule, Take 100 mg by mouth 2 (two) times daily., Disp: , Rfl:  .  Eszopiclone 3 MG TABS, Take 3 mg by mouth at bedtime as needed. Take immediately before bedtime , Disp: , Rfl:  .  ezetimibe (ZETIA) 10 MG tablet, Take 1 tablet (10 mg total) by mouth daily., Disp: 90 tablet, Rfl: 1 .   fenofibrate 160 MG tablet, Take 1 tablet (160 mg total) by mouth daily., Disp: 90 tablet, Rfl: 1 .  finasteride (PROSCAR) 5 MG tablet, Take 1 tablet (5 mg total) by mouth daily., Disp: 90 tablet, Rfl: 3 .  furosemide (LASIX) 40 MG tablet, Take 1 tablet (40 mg total) by mouth daily. Take extra tablet as needed for weight gain >3lb/day or 5lb/wk, Disp: , Rfl:  .  HYDROcodone-acetaminophen (NORCO/VICODIN) 5-325 MG tablet, Take 1 tablet by mouth every 6 (six) hours as needed for moderate pain., Disp: , Rfl:  .  ketoconazole (NIZORAL) 2 % cream, Apply 1 application topically daily as needed (for dermatitits). , Disp: , Rfl:  .  levothyroxine (SYNTHROID, LEVOTHROID) 200 MCG tablet, Take 1 tablet (200 mcg total) by mouth daily before breakfast., Disp: 90 tablet, Rfl: 3 .  linaclotide (LINZESS) 145 MCG CAPS capsule, Take 1 capsule (145 mcg total) by mouth daily. Take on an empty stomach at least 30 minutes before 1st meal of the day, Disp: 90 capsule, Rfl: 1 .  lisinopril (PRINIVIL,ZESTRIL) 10 MG tablet, Take 1 tablet (10 mg total) by mouth daily., Disp: 90 tablet, Rfl: 3 .  loratadine (CLARITIN) 10 MG tablet, Take 1 tablet (10 mg total) by mouth daily., Disp: , Rfl:  .  metoprolol succinate (TOPROL-XL) 50 MG 24 hr tablet, Take 50 mg by mouth daily. , Disp: , Rfl:  .  Multiple Vitamin (MULTIVITAMIN) capsule, Take 1 capsule by mouth daily., Disp: , Rfl:  .  multivitamin-lutein (OCUVITE-LUTEIN) CAPS capsule, Take 1 capsule by mouth daily., Disp: , Rfl:  .  naloxegol oxalate (MOVANTIK) 25 MG TABS tablet, Take 1 tablet (25 mg total) by mouth daily. Take on an empty stomach at least 1 hour before or 2 hours after a meal., Disp: 30 tablet, Rfl: 0 .  nitroGLYCERIN (NITROSTAT) 0.4 MG SL tablet, Place 0.4 mg under the tongue every 5 (five) minutes as needed for chest pain., Disp: , Rfl:  .  omega-3 acid ethyl esters (LOVAZA) 1 g capsule, Take 2 capsules (2 g total) by mouth 2 (two) times daily., Disp: 360 capsule, Rfl:  3 .  potassium chloride (  K-DUR,KLOR-CON) 10 MEQ tablet, Take 1 tablet (10 mEq total) by mouth daily., Disp: , Rfl:  .  tamsulosin (FLOMAX) 0.4 MG CAPS capsule, Take 2 capsules (0.8 mg total) by mouth daily after supper., Disp: , Rfl:  .  tiZANidine (ZANAFLEX) 4 MG capsule, Take 1 capsule (4 mg total) by mouth 2 (two) times daily as needed for muscle spasms., Disp: 60 capsule, Rfl: 0  ROS  Constitutional: Denies any fever or chills Gastrointestinal: No reported hemesis, hematochezia, vomiting, or acute GI distress Musculoskeletal: Denies any acute onset joint swelling, redness, loss of ROM, or weakness Neurological: No reported episodes of acute onset apraxia, aphasia, dysarthria, agnosia, amnesia, paralysis, loss of coordination, or loss of consciousness  Allergies  Anthony [name] is allergic to atorvastatin; gemfibrozil; metformin and related; questran [cholestyramine]; and rosuvastatin.  PFSH  Drug: Anthony [name]  reports that he does not use drugs. Alcohol:  reports that he does not drink alcohol. Tobacco:  reports that  has never smoked. he has never used smokeless tobacco. Medical:  has a past medical history of Anosmia (1980s), Atrial flutter ([place]), CAD (coronary artery disease), Chronic constipation, Chronic insomnia, Chronic lower back pain, Diastolic CHF ([place]), History of diverticulitis, Hyperlipidemia, Hypertension, Hypothyroidism, Obesity, Class II, BMI 35-39.9, with comorbidity, and OSA on CPAP. Surgical: Anthony [name]  has a past surgical history that includes Percutaneous coronary stent intervention (pci-s); Replacement total knee bilateral (Bilateral, 2000s); Spinal cord stimulator implant (06/2016); Nasal sinus surgery; and A-Flutter Ablation (N/A, 05/07/2017). Family: family history includes Kidney disease in his father; Leukemia in his brother; Stroke in his mother and sister.  Constitutional Exam  General appearance: Well nourished, well developed, and well hydrated. In no apparent  acute distress Vitals:   07/05/17 1138  BP: 135/60  Pulse: 88  Resp: 18  Temp: 98.3 F (36.8 C)  TempSrc: Oral  SpO2: 96%  Weight: 281 lb (127.5 kg)  Height: '6\' 1"'  (1.854 m)   BMI Assessment: Estimated body mass index is 37.07 kg/m as calculated from the following:   Height as of this encounter: '6\' 1"'  (1.854 m).   Weight as of this encounter: 281 lb (127.5 kg).  BMI interpretation table: BMI level Category Range association with higher incidence of chronic pain  <18 kg/m2 Underweight   18.5-24.9 kg/m2 Ideal body weight   25-29.9 kg/m2 Overweight Increased incidence by 20%  30-34.9 kg/m2 Obese (Class I) Increased incidence by 68%  35-39.9 kg/m2 Severe obesity (Class II) Increased incidence by 136%  >40 kg/m2 Extreme obesity (Class III) Increased incidence by 254%   BMI Readings from Last 4 Encounters:  07/05/17 37.07 kg/m  06/27/17 36.94 kg/m  06/26/17 37.60 kg/m  06/21/17 34.20 kg/m   Wt Readings from Last 4 Encounters:  07/05/17 281 lb (127.5 kg)  06/27/17 280 lb (127 kg)  06/26/17 285 lb (129.3 kg)  06/21/17 281 lb (127.5 kg)  Psych/Mental status: Alert, oriented x 3 (person, place, & time)       Eyes: PERLA Respiratory: No evidence of acute respiratory distress  Cervical Spine Area Exam  Skin & Axial Inspection: No masses, redness, edema, swelling, or associated skin lesions Alignment: Symmetrical Functional ROM: Unrestricted ROM      Stability: No instability detected Muscle Tone/Strength: Functionally intact. No obvious neuro-muscular anomalies detected. Sensory (Neurological): Unimpaired Palpation: No palpable anomalies              Upper Extremity (UE) Exam    Side: Right upper extremity  Side: Left upper extremity  Skin &  Extremity Inspection: Skin color, temperature, and hair growth are WNL. No peripheral edema or cyanosis. No masses, redness, swelling, asymmetry, or associated skin lesions. No contractures.  Skin & Extremity Inspection: Skin color,  temperature, and hair growth are WNL. No peripheral edema or cyanosis. No masses, redness, swelling, asymmetry, or associated skin lesions. No contractures.  Functional ROM: Unrestricted ROM          Functional ROM: Unrestricted ROM          Muscle Tone/Strength: Functionally intact. No obvious neuro-muscular anomalies detected.  Muscle Tone/Strength: Functionally intact. No obvious neuro-muscular anomalies detected.  Sensory (Neurological): Unimpaired          Sensory (Neurological): Unimpaired          Palpation: No palpable anomalies              Palpation: No palpable anomalies              Specialized Test(s): Deferred         Specialized Test(s): Deferred          Thoracic Spine Area Exam  Skin & Axial Inspection: No masses, redness, or swelling Alignment: Symmetrical Functional ROM: Unrestricted ROM Stability: No instability detected Muscle Tone/Strength: Functionally intact. No obvious neuro-muscular anomalies detected. Sensory (Neurological): Unimpaired Muscle strength & Tone: No palpable anomalies  Lumbar Spine Area Exam  Skin & Axial Inspection: No masses, redness, or swelling Alignment: Symmetrical Functional ROM: Unrestricted ROM      Stability: No instability detected Muscle Tone/Strength: Functionally intact. No obvious neuro-muscular anomalies detected. Sensory (Neurological): Unimpaired Palpation: No palpable anomalies       Provocative Tests: Lumbar Hyperextension and rotation test: Improved after treatment on left        Lumbar Lateral bending test: evaluation deferred today       Patrick's Maneuver: evaluation deferred today                    Gait & Posture Assessment  Ambulation: Unassisted Gait: Relatively normal for age and body habitus Posture: WNL   Lower Extremity Exam    Side: Right lower extremity  Side: Left lower extremity  Skin & Extremity Inspection: Skin color, temperature, and hair growth are WNL. No peripheral edema or cyanosis. No masses,  redness, swelling, asymmetry, or associated skin lesions. No contractures.  Skin & Extremity Inspection: Skin color, temperature, and hair growth are WNL. No peripheral edema or cyanosis. No masses, redness, swelling, asymmetry, or associated skin lesions. No contractures.  Functional ROM: Unrestricted ROM          Functional ROM: Unrestricted ROM          Muscle Tone/Strength: Functionally intact. No obvious neuro-muscular anomalies detected.  Muscle Tone/Strength: Functionally intact. No obvious neuro-muscular anomalies detected.  Sensory (Neurological): Unimpaired  Sensory (Neurological): Unimpaired  Palpation: No palpable anomalies  Palpation: No palpable anomalies   Assessment  Primary Diagnosis & Pertinent Problem List: The primary encounter diagnosis was Spondylosis without myelopathy or radiculopathy, lumbar region. Diagnoses of Lumbar facet arthropathy, Chronic left-sided low back pain without sciatica, and Lumbar degenerative disc disease were also pertinent to this visit.  Status Diagnosis  Responding Responding Responding 1. Spondylosis without myelopathy or radiculopathy, lumbar region   2. Lumbar facet arthropathy   3. Chronic left-sided low back pain without sciatica   4. Lumbar degenerative disc disease      81 year old male with a history of lumbar spondylosis, lumbar facet arthropathy most pronounced on the left status post left L3/4,  L4/5, L5/S1 facet diagnostic nerve block on 06/13/2017, 06/27/17. Patient returns today for follow-up. He states that his left low back pain improved by greater than 70% for 48 hours after the block. Patient was able to sleep better and also perform activities of daily living with greater ease. This would suggest a positive diagnostic block x 2 and plan to proceed with radiofrequency ablation. Patient to stop Eliquis 5 days prior to RFA.  Plan -Left L3-L5 RFA with sedation. Patient to be off Eliquis for 5 days prior to RFA    Future  Appointments  Date Time Provider [place]  07/09/2017  8:00 AM Gillis Santa, MD [facility] None  07/24/2017  2:00 PM [facility]  10/11/2017 10:30 AM Eustace Pen, LPN [facility]  9/37/3428 11:30 AM Ria Bush, MD [facility]    Primary Care Physician: Ria Bush, MD Location: [facility] Note by: Gillis Santa, M.D Date: 07/05/2017; Time: 3:41 PM  There are no Patient Instructions on file for this visit.

## 2017-07-05 NOTE — Progress Notes (Deleted)
Patient's Name: Anthony [name]  MRN: [id number]  Referring Provider: Ria Bush, MD  DOB: May 18, 1936  PCP: Ria Bush, MD  DOS: 07/05/2017  Note by: Gillis Santa, MD  Service setting: Ambulatory outpatient  Specialty: Interventional Pain Management  Location: [facility] (AMB) Pain Management Facility    Patient type: Established   Primary Reason(s) for Visit: Encounter for post-procedure evaluation of chronic illness with mild to moderate exacerbation CC: Back Pain (lower, left and right)  HPI  Anthony [name] is a 81 y.o. year old, male patient, who comes today for a post-procedure evaluation. He has History of basal cell carcinoma (BCC); History of squamous cell carcinoma in situ of skin; Hypercholesterolemia with hypertriglyceridemia; Chronic constipation; OSA on CPAP; BPH (benign prostatic hyperplasia); Atrial flutter ([place]); Chronic insomnia; (HFpEF) heart failure with preserved ejection fraction ([place]); Hypertension; Hypothyroidism; Obesity, Class II, BMI 35-39.9, with comorbidity; CAD (coronary artery disease); Chronic lower back pain; and Nasal congestion on their problem list. His primarily concern today is the Back Pain (lower, left and right)  Pain Assessment: Location: Left, Right Back Radiating: back of left upper leg Onset: More than a month ago Duration: Chronic pain Quality: Dull, Aching Severity: 5 /10 (self-reported pain score)  Note: Reported level is compatible with observation.                         When using our objective Pain Scale, levels between 6 and 10/10 are said to belong in an emergency room, as it progressively worsens from a 6/10, described as severely limiting, requiring emergency care not usually available at an outpatient pain management facility. At a 6/10 level, communication becomes difficult and requires great effort. Assistance to reach the emergency department may be required. Facial flushing and profuse sweating along with potentially dangerous increases  in heart rate and blood pressure will be evident. Effect on ADL:   Timing: Constant Modifying factors: sitting, lying down  Anthony [name] comes in today for post-procedure evaluation after the treatment done on 06/27/2017.  Further details on both, my assessment(s), as well as the proposed treatment plan, please see below.  Post-Procedure Assessment  06/27/2017 Procedure: *** Pre-procedure pain score:        /10 Post-procedure pain score: 0/10         Influential Factors: BMI: 37.07 kg/m Intra-procedural challenges: None observed.         Assessment challenges: None detected.              Reported side-effects: None.        Post-procedural adverse reactions or complications: None reported         Sedation: Please see nurses note. When no sedatives are used, the analgesic levels obtained are directly associated to the effectiveness of the local anesthetics. However, when sedation is provided, the level of analgesia obtained during the initial 1 hour following the intervention, is believed to be the result of a combination of factors. These factors may include, but are not limited to: 1. The effectiveness of the local anesthetics used. 2. The effects of the analgesic(s) and/or anxiolytic(s) used. 3. The degree of discomfort experienced by the patient at the time of the procedure. 4. The patients ability and reliability in recalling and recording the events. 5. The presence and influence of possible secondary gains and/or psychosocial factors. Reported result: Relief experienced during the 1st hour after the procedure: 70 % (Ultra-Short Term Relief)  Interpretative annotation: Clinically appropriate result. Analgesia during this period is likely to be Local Anesthetic and/or IV Sedative (Analgesic/Anxiolytic) related.          Effects of local anesthetic: The analgesic effects attained during this period are directly associated to the localized infiltration of local anesthetics and  therefore cary significant diagnostic value as to the etiological location, or anatomical origin, of the pain. Expected duration of relief is directly dependent on the pharmacodynamics of the local anesthetic used. Long-acting (4-6 hours) anesthetics used.  Reported result: Relief during the next 4 to 6 hour after the procedure: 50 % (Short-Term Relief)            Interpretative annotation: Clinically appropriate result. Analgesia during this period is likely to be Local Anesthetic-related.          Long-term benefit: Defined as the period of time past the expected duration of local anesthetics (1 hour for short-acting and 4-6 hours for long-acting). With the possible exception of prolonged sympathetic blockade from the local anesthetics, benefits during this period are typically attributed to, or associated with, other factors such as analgesic sensory neuropraxia, antiinflammatory effects, or beneficial biochemical changes provided by agents other than the local anesthetics.  Reported result: Extended relief following procedure: 60 % (Long-Term Relief)            Interpretative annotation: Clinically appropriate result. Good relief. No permanent benefit expected. Inflammation plays a part in the etiology to the pain.          Current benefits: Defined as reported results that persistent at this point in time.   Analgesia: *** %            Function: Somewhat improved ROM: Somewhat improved Interpretative annotation: Recurrence of symptoms. No permanent benefit expected. Effective diagnostic intervention.          Interpretation: Results would suggest a successful diagnostic intervention.                  Plan:  Please see "Plan of Care" for details.        Laboratory Chemistry  Inflammation Markers (CRP: Acute Phase) (ESR: Chronic Phase) No results found for: CRP, ESRSEDRATE               Renal Function Markers Lab Results  Component Value Date   BUN 21 06/26/2017   CREATININE 1.23  06/26/2017   GFRAA 54 (L) 05/03/2017   GFRNONAA 47 (L) 05/03/2017                 Hepatic Function Markers Lab Results  Component Value Date   AST 23 04/16/2017   ALT 25 04/16/2017   ALBUMIN 4.3 06/26/2017   ALKPHOS 59 04/16/2017                 Electrolytes Lab Results  Component Value Date   NA 138 06/26/2017   K 4.1 06/26/2017   CL 102 06/26/2017   CALCIUM 10.6 (H) 06/26/2017                 Neuropathy Markers No results found for: HALPFXTK24               Bone Pathology Markers Lab Results  Component Value Date   ALKPHOS 59 04/16/2017   CALCIUM 10.6 (H) 06/26/2017                 Rheumatology Markers No results found for: LABURIC  Coagulation Parameters Lab Results  Component Value Date   PLT 272 05/03/2017                 Cardiovascular Markers Lab Results  Component Value Date   HGB 14.4 05/03/2017   HCT 43.3 05/03/2017                 CA Markers No results found for: CEA, CA125               Note: Lab results reviewed.  Recent Diagnostic Imaging Results  DG C-Arm 1-60 Min-No Report Fluoroscopy was utilized by the requesting physician.  No radiographic  interpretation.   Complexity Note: Imaging results reviewed. Results shared with Anthony [name], using Layman's terms.                         Meds   Current Outpatient Medications:  .  aspirin 81 MG tablet, Take 81 mg by mouth daily., Disp: , Rfl:  .  diphenhydramine-acetaminophen (TYLENOL PM EXTRA STRENGTH) 25-500 MG TABS tablet, Take 1 tablet by mouth at bedtime as needed. (Patient taking differently: Take 1 tablet by mouth at bedtime as needed (for sleep.). ), Disp: , Rfl:  .  docusate sodium (COLACE) 100 MG capsule, Take 100 mg by mouth 2 (two) times daily., Disp: , Rfl:  .  Eszopiclone 3 MG TABS, Take 3 mg by mouth at bedtime as needed. Take immediately before bedtime , Disp: , Rfl:  .  ezetimibe (ZETIA) 10 MG tablet, Take 1 tablet (10 mg total) by mouth daily., Disp: 90 tablet,  Rfl: 1 .  fenofibrate 160 MG tablet, Take 1 tablet (160 mg total) by mouth daily., Disp: 90 tablet, Rfl: 1 .  finasteride (PROSCAR) 5 MG tablet, Take 1 tablet (5 mg total) by mouth daily., Disp: 90 tablet, Rfl: 3 .  furosemide (LASIX) 40 MG tablet, Take 1 tablet (40 mg total) by mouth daily. Take extra tablet as needed for weight gain >3lb/day or 5lb/wk, Disp: , Rfl:  .  HYDROcodone-acetaminophen (NORCO/VICODIN) 5-325 MG tablet, Take 1 tablet by mouth every 6 (six) hours as needed for moderate pain., Disp: , Rfl:  .  ketoconazole (NIZORAL) 2 % cream, Apply 1 application topically daily as needed (for dermatitits). , Disp: , Rfl:  .  levothyroxine (SYNTHROID, LEVOTHROID) 200 MCG tablet, Take 1 tablet (200 mcg total) by mouth daily before breakfast., Disp: 90 tablet, Rfl: 3 .  linaclotide (LINZESS) 145 MCG CAPS capsule, Take 1 capsule (145 mcg total) by mouth daily. Take on an empty stomach at least 30 minutes before 1st meal of the day, Disp: 90 capsule, Rfl: 1 .  lisinopril (PRINIVIL,ZESTRIL) 10 MG tablet, Take 1 tablet (10 mg total) by mouth daily., Disp: 90 tablet, Rfl: 3 .  loratadine (CLARITIN) 10 MG tablet, Take 1 tablet (10 mg total) by mouth daily., Disp: , Rfl:  .  metoprolol succinate (TOPROL-XL) 50 MG 24 hr tablet, Take 50 mg by mouth daily. , Disp: , Rfl:  .  Multiple Vitamin (MULTIVITAMIN) capsule, Take 1 capsule by mouth daily., Disp: , Rfl:  .  multivitamin-lutein (OCUVITE-LUTEIN) CAPS capsule, Take 1 capsule by mouth daily., Disp: , Rfl:  .  naloxegol oxalate (MOVANTIK) 25 MG TABS tablet, Take 1 tablet (25 mg total) by mouth daily. Take on an empty stomach at least 1 hour before or 2 hours after a meal., Disp: 30 tablet, Rfl: 0 .  nitroGLYCERIN (NITROSTAT) 0.4 MG SL tablet,  Place 0.4 mg under the tongue every 5 (five) minutes as needed for chest pain., Disp: , Rfl:  .  omega-3 acid ethyl esters (LOVAZA) 1 g capsule, Take 2 capsules (2 g total) by mouth 2 (two) times daily., Disp: 360  capsule, Rfl: 3 .  potassium chloride (K-DUR,KLOR-CON) 10 MEQ tablet, Take 1 tablet (10 mEq total) by mouth daily., Disp: , Rfl:  .  tamsulosin (FLOMAX) 0.4 MG CAPS capsule, Take 2 capsules (0.8 mg total) by mouth daily after supper., Disp: , Rfl:  .  tiZANidine (ZANAFLEX) 4 MG capsule, Take 1 capsule (4 mg total) by mouth 2 (two) times daily as needed for muscle spasms., Disp: 60 capsule, Rfl: 0  ROS  Constitutional: Denies any fever or chills Gastrointestinal: No reported hemesis, hematochezia, vomiting, or acute GI distress Musculoskeletal: Denies any acute onset joint swelling, redness, loss of ROM, or weakness Neurological: No reported episodes of acute onset apraxia, aphasia, dysarthria, agnosia, amnesia, paralysis, loss of coordination, or loss of consciousness  Allergies  Anthony [name] is allergic to atorvastatin; gemfibrozil; metformin and related; questran [cholestyramine]; and rosuvastatin.  PFSH  Drug: Anthony [name]  reports that he does not use drugs. Alcohol:  reports that he does not drink alcohol. Tobacco:  reports that  has never smoked. he has never used smokeless tobacco. Medical:  has a past medical history of Anosmia (1980s), Atrial flutter ([place]), CAD (coronary artery disease), Chronic constipation, Chronic insomnia, Chronic lower back pain, Diastolic CHF ([place]), History of diverticulitis, Hyperlipidemia, Hypertension, Hypothyroidism, Obesity, Class II, BMI 35-39.9, with comorbidity, and OSA on CPAP. Surgical: Anthony [name]  has a past surgical history that includes Percutaneous coronary stent intervention (pci-s); Replacement total knee bilateral (Bilateral, 2000s); Spinal cord stimulator implant (06/2016); Nasal sinus surgery; and A-Flutter Ablation (N/A, 05/07/2017). Family: family history includes Kidney disease in his father; Leukemia in his brother; Stroke in his mother and sister.  Constitutional Exam  General appearance: Well nourished, well developed, and well hydrated. In  no apparent acute distress Vitals:   07/05/17 1138  BP: 135/60  Pulse: 88  Resp: 18  Temp: 98.3 F (36.8 C)  TempSrc: Oral  SpO2: 96%  Weight: 281 lb (127.5 kg)  Height: '6\' 1"'  (1.854 m)   BMI Assessment: Estimated body mass index is 37.07 kg/m as calculated from the following:   Height as of this encounter: '6\' 1"'  (1.854 m).   Weight as of this encounter: 281 lb (127.5 kg).  BMI interpretation table: BMI level Category Range association with higher incidence of chronic pain  <18 kg/m2 Underweight   18.5-24.9 kg/m2 Ideal body weight   25-29.9 kg/m2 Overweight Increased incidence by 20%  30-34.9 kg/m2 Obese (Class I) Increased incidence by 68%  35-39.9 kg/m2 Severe obesity (Class II) Increased incidence by 136%  >40 kg/m2 Extreme obesity (Class III) Increased incidence by 254%   BMI Readings from Last 4 Encounters:  07/05/17 37.07 kg/m  06/27/17 36.94 kg/m  06/26/17 37.60 kg/m  06/21/17 34.20 kg/m   Wt Readings from Last 4 Encounters:  07/05/17 281 lb (127.5 kg)  06/27/17 280 lb (127 kg)  06/26/17 285 lb (129.3 kg)  06/21/17 281 lb (127.5 kg)  Psych/Mental status: Alert, oriented x 3 (person, place, & time)       Eyes: PERLA Respiratory: No evidence of acute respiratory distress  Cervical Spine Area Exam  Skin & Axial Inspection: No masses, redness, edema, swelling, or associated skin lesions Alignment: Symmetrical Functional ROM: Unrestricted ROM      Stability:  No instability detected Muscle Tone/Strength: Functionally intact. No obvious neuro-muscular anomalies detected. Sensory (Neurological): Unimpaired Palpation: No palpable anomalies              Upper Extremity (UE) Exam    Side: Right upper extremity  Side: Left upper extremity  Skin & Extremity Inspection: Skin color, temperature, and hair growth are WNL. No peripheral edema or cyanosis. No masses, redness, swelling, asymmetry, or associated skin lesions. No contractures.  Skin & Extremity Inspection:  Skin color, temperature, and hair growth are WNL. No peripheral edema or cyanosis. No masses, redness, swelling, asymmetry, or associated skin lesions. No contractures.  Functional ROM: Unrestricted ROM          Functional ROM: Unrestricted ROM          Muscle Tone/Strength: Functionally intact. No obvious neuro-muscular anomalies detected.  Muscle Tone/Strength: Functionally intact. No obvious neuro-muscular anomalies detected.  Sensory (Neurological): Unimpaired          Sensory (Neurological): Unimpaired          Palpation: No palpable anomalies              Palpation: No palpable anomalies              Specialized Test(s): Deferred         Specialized Test(s): Deferred          Thoracic Spine Area Exam  Skin & Axial Inspection: No masses, redness, or swelling Alignment: Symmetrical Functional ROM: Unrestricted ROM Stability: No instability detected Muscle Tone/Strength: Functionally intact. No obvious neuro-muscular anomalies detected. Sensory (Neurological): Unimpaired Muscle strength & Tone: No palpable anomalies  Lumbar Spine Area Exam  Skin & Axial Inspection: No masses, redness, or swelling Alignment: Symmetrical Functional ROM: Unrestricted ROM      Stability: No instability detected Muscle Tone/Strength: Functionally intact. No obvious neuro-muscular anomalies detected. Sensory (Neurological): Unimpaired Palpation: No palpable anomalies       Provocative Tests: Lumbar Hyperextension and rotation test: evaluation deferred today       Lumbar Lateral bending test: evaluation deferred today       Patrick's Maneuver: evaluation deferred today                    Gait & Posture Assessment  Ambulation: Unassisted Gait: Relatively normal for age and body habitus Posture: WNL   Lower Extremity Exam    Side: Right lower extremity  Side: Left lower extremity  Skin & Extremity Inspection: Skin color, temperature, and hair growth are WNL. No peripheral edema or cyanosis. No masses,  redness, swelling, asymmetry, or associated skin lesions. No contractures.  Skin & Extremity Inspection: Skin color, temperature, and hair growth are WNL. No peripheral edema or cyanosis. No masses, redness, swelling, asymmetry, or associated skin lesions. No contractures.  Functional ROM: Unrestricted ROM          Functional ROM: Unrestricted ROM          Muscle Tone/Strength: Functionally intact. No obvious neuro-muscular anomalies detected.  Muscle Tone/Strength: Functionally intact. No obvious neuro-muscular anomalies detected.  Sensory (Neurological): Unimpaired  Sensory (Neurological): Unimpaired  Palpation: No palpable anomalies  Palpation: No palpable anomalies   Assessment  Primary Diagnosis & Pertinent Problem List: The primary encounter diagnosis was Spondylosis without myelopathy or radiculopathy, lumbar region. Diagnoses of Lumbar facet arthropathy, Chronic left-sided low back pain without sciatica, and Lumbar degenerative disc disease were also pertinent to this visit.  Status Diagnosis  Controlled Controlled Controlled 1. Spondylosis without myelopathy or radiculopathy, lumbar region  2. Lumbar facet arthropathy   3. Chronic left-sided low back pain without sciatica   4. Lumbar degenerative disc disease     Problems updated and reviewed during this visit: No problems updated. Plan of Care  Pharmacotherapy (Medications Ordered): No orders of the defined types were placed in this encounter.  Lab-work, procedure(s), and/or referral(s): Orders Placed This Encounter  Procedures  . Radiofrequency,Lumbar    Pharmacological management options:  Opioid Analgesics: We'll take over management today. See above orders Membrane stabilizer: We have discussed the possibility of optimizing this mode of therapy, if tolerated Muscle relaxant: We have discussed the possibility of a trial NSAID: We have discussed the possibility of a trial Other analgesic(s): To be determined at a later  time   Interventional management options: Planned, scheduled, and/or pending:    ***   Considering:   ***   PRN Procedures:   To be determined at a later time   Provider-requested follow-up: No Follow-up on file.  Future Appointments  Date Time Provider [place]  07/09/2017  8:00 AM Gillis Santa, MD [facility] None  07/24/2017  2:00 PM [facility]  10/11/2017 10:30 AM Eustace Pen, LPN [facility]  3/84/5364 11:30 AM Ria Bush, MD [facility]    Primary Care Physician: Ria Bush, MD Location: [facility] Note by: Gillis Santa, M.D Date: 07/05/2017; Time: 11:59 AM  There are no Patient Instructions on file for this visit.

## 2017-07-05 NOTE — Progress Notes (Signed)
Safety precautions to be maintained throughout the outpatient stay will include: orient to surroundings, keep bed in low position, maintain call bell within reach at all times, provide assistance with transfer out of bed and ambulation.  

## 2017-07-05 NOTE — Progress Notes (Deleted)
Patient's Name: Anthony [name]  MRN: [id number]  Referring Provider: Ria Bush, MD  DOB: 07/05/1936  PCP: Ria Bush, MD  DOS: 07/05/2017  Note by: Gillis Santa, MD  Service setting: Ambulatory outpatient  Specialty: Interventional Pain Management  Location: [facility] (AMB) Pain Management Facility  Visit type: Initial Patient Evaluation  Patient type: New Patient   Primary Reason(s) for Visit: Encounter for initial evaluation of one or more chronic problems (new to examiner) potentially causing chronic pain, and posing a threat to normal musculoskeletal function. (Level of risk: High) CC: Back Pain (lower, left and right)  HPI  [name] is a 81 y.o. year old, male patient, who comes today to see [name] for the first time for an initial evaluation of his chronic pain. He has History of basal cell carcinoma (BCC); History of squamous cell carcinoma in situ of skin; Hypercholesterolemia with hypertriglyceridemia; Chronic constipation; OSA on CPAP; BPH (benign prostatic hyperplasia); Atrial flutter ([place]); Chronic insomnia; (HFpEF) heart failure with preserved ejection fraction ([place]); Hypertension; Hypothyroidism; Obesity, Class II, BMI 35-39.9, with comorbidity; CAD (coronary artery disease); Chronic lower back pain; and Nasal congestion on their problem list. Today he comes in for evaluation of his Back Pain (lower, left and right)  Pain Assessment: Location: Left, Right Back Radiating: back of left upper leg Onset: More than a month ago Duration: Chronic pain Quality: Dull, Aching Severity: 5 /10 (self-reported pain score)  Note: Reported level is compatible with observation.                         When using our objective Pain Scale, levels between 6 and 10/10 are said to belong in an emergency room, as it progressively worsens from a 6/10, described as severely limiting, requiring emergency care not usually available at an outpatient pain management facility. At a 6/10 level, communication  becomes difficult and requires great effort. Assistance to reach the emergency department may be required. Facial flushing and profuse sweating along with potentially dangerous increases in heart rate and blood pressure will be evident. Effect on ADL:   Timing: Constant Modifying factors: sitting, lying down  Onset and Duration: {Hx; Onset and Duration:210120511} Cause of pain: {Hx; Cause:210120521} Severity: {Pain Severity:210120502} Timing: {Symptoms; Timing:210120501} Aggravating Factors: {Causes; Aggravating pain factors:210120507} Alleviating Factors: {Causes; Alleviating Factors:210120500} Associated Problems: {Hx; Associated problems:210120515} Quality of Pain: {Hx; Symptom quality or Descriptor:210120531} Previous Examinations or Tests: {Hx; Previous examinations or test:210120529} Previous Treatments: {Hx; Previous Treatment:210120503}  The patient comes into the clinics today for the first time for a chronic pain management evaluation. ***  Today I took the time to provide the patient with information regarding my pain practice. The patient was informed that my practice is divided into two sections: an interventional pain management section, as well as a completely separate and distinct medication management section. I explained that I have procedure days for my interventional therapies, and evaluation days for follow-ups and medication management. Because of the amount of documentation required during both, they are kept separated. This means that there is the possibility that he may be scheduled for a procedure on one day, and medication management the next. I have also informed him that because of staffing and facility limitations, I no longer take patients for medication management only. To illustrate the reasons for this, I gave the patient the example of surgeons, and how inappropriate it would be to refer a patient to his/her care, just to write for the post-surgical antibiotics on  a surgery done by a different surgeon.   Because interventional pain management is my board-certified specialty, the patient was informed that joining my practice means that they are open to any and all interventional therapies. I made it clear that this does not mean that they will be forced to have any procedures done. What this means is that I believe interventional therapies to be essential part of the diagnosis and proper management of chronic pain conditions. Therefore, patients not interested in these interventional alternatives will be better served under the care of a different practitioner.  The patient was also made aware of my Comprehensive Pain Management Safety Guidelines where by joining my practice, they limit all of their nerve blocks and joint injections to those done by our practice, for as long as we are retained to manage their care.   Historic Controlled Substance Pharmacotherapy Review  PMP and historical list of controlled substances: ***  Highest opioid analgesic regimen found: ***  Most recent opioid analgesic: ***  Current opioid analgesics: ***  Highest recorded MME/day: *** mg/day MME/day: *** mg/day Medications: The patient did not bring the medication(s) to the appointment, as requested in our "New Patient Package" Pharmacodynamics: Desired effects: Analgesia: The patient reports >50% benefit. Reported improvement in function: The patient reports medication allows him to accomplish basic ADLs. Clinically meaningful improvement in function (CMIF): Sustained CMIF goals met Perceived effectiveness: Described as relatively effective, allowing for increase in activities of daily living (ADL) Undesirable effects: Side-effects or Adverse reactions: None reported Historical Monitoring: The patient  reports that he does not use drugs. List of all UDS Test(s): No results found for: MDMA, COCAINSCRNUR, PCPSCRNUR, PCPQUANT, CANNABQUANT, THCU, [place] List of all Serum Drug  Screening Test(s):  No results found for: AMPHSCRSER, BARBSCRSER, BENZOSCRSER, COCAINSCRSER, PCPSCRSER, PCPQUANT, THCSCRSER, CANNABQUANT, OPIATESCRSER, OXYSCRSER, PROPOXSCRSER Historical Background Evaluation: [place] PMP: Six (6) year initial data search conducted.             PMP NARX Score Report:  Narcotic: *** Sedative: *** Stimulant: *** [place] Department of public safety, offender search: Editor, commissioning Information) Non-contributory Risk Assessment Profile: Aberrant behavior: None observed or detected today Risk factors for fatal opioid overdose: None identified today PMP NARX Overdose Risk Score: *** Fatal overdose hazard ratio (HR): Calculation deferred Non-fatal overdose hazard ratio (HR): Calculation deferred Risk of opioid abuse or dependence: 0.7-3.0% with doses ? 36 MME/day and 6.1-26% with doses ? 120 MME/day. Substance use disorder (SUD) risk level: Pending results of Medical Psychology Evaluation for SUD Opioid risk tool (ORT) (Total Score):   Opioid Risk Tool - 06/27/17 0927      Family History of Substance Abuse   Alcohol  Negative    Illegal Drugs  Negative    Rx Drugs  Negative      Personal History of Substance Abuse   Alcohol  Negative    Illegal Drugs  Negative    Rx Drugs  Negative      Age   Age between 53-45 years   No      History of Preadolescent Sexual Abuse   History of Preadolescent Sexual Abuse  Negative or Male      Psychological Disease   Psychological Disease  Negative    Depression  Negative      Total Score   Opioid Risk Tool Scoring  0    Opioid Risk Interpretation  Low Risk      ORT Scoring interpretation table:  Score <3 = Low Risk for SUD  Score between  4-7 = Moderate Risk for SUD  Score >8 = High Risk for Opioid Abuse   PHQ-2 Depression Scale:  Total score: 0  PHQ-2 Scoring interpretation table: (Score and probability of major depressive disorder)  Score 0 = No depression  Score 1 = 15.4% Probability  Score 2 = 21.1% Probability   Score 3 = 38.4% Probability  Score 4 = 45.5% Probability  Score 5 = 56.4% Probability  Score 6 = 78.6% Probability   PHQ-9 Depression Scale:  Total score: 0  PHQ-9 Scoring interpretation table:  Score 0-4 = No depression  Score 5-9 = Mild depression  Score 10-14 = Moderate depression  Score 15-19 = Moderately severe depression  Score 20-27 = Severe depression (2.4 times higher risk of SUD and 2.89 times higher risk of overuse)   Pharmacologic Plan: Pending ordered tests and/or consults            Initial impression: Pending review of available data and ordered tests.  Meds   Current Outpatient Medications:  .  aspirin 81 MG tablet, Take 81 mg by mouth daily., Disp: , Rfl:  .  diphenhydramine-acetaminophen (TYLENOL PM EXTRA STRENGTH) 25-500 MG TABS tablet, Take 1 tablet by mouth at bedtime as needed. (Patient taking differently: Take 1 tablet by mouth at bedtime as needed (for sleep.). ), Disp: , Rfl:  .  docusate sodium (COLACE) 100 MG capsule, Take 100 mg by mouth 2 (two) times daily., Disp: , Rfl:  .  Eszopiclone 3 MG TABS, Take 3 mg by mouth at bedtime as needed. Take immediately before bedtime , Disp: , Rfl:  .  ezetimibe (ZETIA) 10 MG tablet, Take 1 tablet (10 mg total) by mouth daily., Disp: 90 tablet, Rfl: 1 .  fenofibrate 160 MG tablet, Take 1 tablet (160 mg total) by mouth daily., Disp: 90 tablet, Rfl: 1 .  finasteride (PROSCAR) 5 MG tablet, Take 1 tablet (5 mg total) by mouth daily., Disp: 90 tablet, Rfl: 3 .  furosemide (LASIX) 40 MG tablet, Take 1 tablet (40 mg total) by mouth daily. Take extra tablet as needed for weight gain >3lb/day or 5lb/wk, Disp: , Rfl:  .  HYDROcodone-acetaminophen (NORCO/VICODIN) 5-325 MG tablet, Take 1 tablet by mouth every 6 (six) hours as needed for moderate pain., Disp: , Rfl:  .  ketoconazole (NIZORAL) 2 % cream, Apply 1 application topically daily as needed (for dermatitits). , Disp: , Rfl:  .  levothyroxine (SYNTHROID, LEVOTHROID) 200 MCG  tablet, Take 1 tablet (200 mcg total) by mouth daily before breakfast., Disp: 90 tablet, Rfl: 3 .  linaclotide (LINZESS) 145 MCG CAPS capsule, Take 1 capsule (145 mcg total) by mouth daily. Take on an empty stomach at least 30 minutes before 1st meal of the day, Disp: 90 capsule, Rfl: 1 .  lisinopril (PRINIVIL,ZESTRIL) 10 MG tablet, Take 1 tablet (10 mg total) by mouth daily., Disp: 90 tablet, Rfl: 3 .  loratadine (CLARITIN) 10 MG tablet, Take 1 tablet (10 mg total) by mouth daily., Disp: , Rfl:  .  metoprolol succinate (TOPROL-XL) 50 MG 24 hr tablet, Take 50 mg by mouth daily. , Disp: , Rfl:  .  Multiple Vitamin (MULTIVITAMIN) capsule, Take 1 capsule by mouth daily., Disp: , Rfl:  .  multivitamin-lutein (OCUVITE-LUTEIN) CAPS capsule, Take 1 capsule by mouth daily., Disp: , Rfl:  .  naloxegol oxalate (MOVANTIK) 25 MG TABS tablet, Take 1 tablet (25 mg total) by mouth daily. Take on an empty stomach at least 1 hour before or 2  hours after a meal., Disp: 30 tablet, Rfl: 0 .  nitroGLYCERIN (NITROSTAT) 0.4 MG SL tablet, Place 0.4 mg under the tongue every 5 (five) minutes as needed for chest pain., Disp: , Rfl:  .  omega-3 acid ethyl esters (LOVAZA) 1 g capsule, Take 2 capsules (2 g total) by mouth 2 (two) times daily., Disp: 360 capsule, Rfl: 3 .  potassium chloride (K-DUR,KLOR-CON) 10 MEQ tablet, Take 1 tablet (10 mEq total) by mouth daily., Disp: , Rfl:  .  tamsulosin (FLOMAX) 0.4 MG CAPS capsule, Take 2 capsules (0.8 mg total) by mouth daily after supper., Disp: , Rfl:  .  tiZANidine (ZANAFLEX) 4 MG capsule, Take 1 capsule (4 mg total) by mouth 2 (two) times daily as needed for muscle spasms., Disp: 60 capsule, Rfl: 0  Imaging Review  Cervical Imaging: Cervical MR wo contrast: No results found for this or any previous visit. Cervical MR wo contrast: No results found for this or any previous visit. Cervical MR w/wo contrast: No results found for this or any previous visit. Cervical MR w contrast: No  results found for this or any previous visit. Cervical CT wo contrast: No results found for this or any previous visit. Cervical CT w/wo contrast: No results found for this or any previous visit. Cervical CT w/wo contrast: No results found for this or any previous visit. Cervical CT w contrast: No results found for this or any previous visit. Cervical CT outside: No results found for this or any previous visit. Cervical DG 1 view: No results found for this or any previous visit. Cervical DG 2-3 views: No results found for this or any previous visit. Cervical DG F/E views: No results found for this or any previous visit. Cervical DG 2-3 clearing views: No results found for this or any previous visit. Cervical DG Bending/F/E views: No results found for this or any previous visit. Cervical DG complete: No results found for this or any previous visit. Cervical DG Myelogram views: No results found for this or any previous visit. Cervical DG Myelogram views: No results found for this or any previous visit. Cervical Discogram views: No results found for this or any previous visit.  Shoulder Imaging: Shoulder-R MR w contrast: No results found for this or any previous visit. Shoulder-L MR w contrast: No results found for this or any previous visit. Shoulder-R MR w/wo contrast: No results found for this or any previous visit. Shoulder-L MR w/wo contrast: No results found for this or any previous visit. Shoulder-R MR wo contrast: No results found for this or any previous visit. Shoulder-L MR wo contrast: No results found for this or any previous visit. Shoulder-R CT w contrast: No results found for this or any previous visit. Shoulder-L CT w contrast: No results found for this or any previous visit. Shoulder-R CT w/wo contrast: No results found for this or any previous visit. Shoulder-L CT w/wo contrast: No results found for this or any previous visit. Shoulder-R CT wo contrast: No results found for  this or any previous visit. Shoulder-L CT wo contrast: No results found for this or any previous visit. Shoulder-R DG Arthrogram: No results found for this or any previous visit. Shoulder-L DG Arthrogram: No results found for this or any previous visit. Shoulder-R DG 1 view: No results found for this or any previous visit. Shoulder-L DG 1 view: No results found for this or any previous visit. Shoulder-R DG: No results found for this or any previous visit. Shoulder-L DG: No results  found for this or any previous visit.  Thoracic Imaging: Thoracic MR wo contrast: No results found for this or any previous visit. Thoracic MR wo contrast: No results found for this or any previous visit. Thoracic MR w/wo contrast: No results found for this or any previous visit. Thoracic MR w contrast: No results found for this or any previous visit. Thoracic CT wo contrast: No results found for this or any previous visit. Thoracic CT w/wo contrast: No results found for this or any previous visit. Thoracic CT w/wo contrast: No results found for this or any previous visit. Thoracic CT w contrast: No results found for this or any previous visit. Thoracic DG 2-3 views: No results found for this or any previous visit. Thoracic DG 4 views: No results found for this or any previous visit. Thoracic DG: No results found for this or any previous visit. Thoracic DG w/swimmers view: No results found for this or any previous visit. Thoracic DG Myelogram views: No results found for this or any previous visit. Thoracic DG Myelogram views: No results found for this or any previous visit.  Lumbosacral Imaging: Lumbar MR wo contrast: No results found for this or any previous visit. Lumbar MR wo contrast: No results found for this or any previous visit. Lumbar MR w/wo contrast: No results found for this or any previous visit. Lumbar MR w contrast:  Results for orders placed in visit on 04/12/17  MR LUMBAR SPINE W CONTRAST    Lumbar CT wo contrast: No results found for this or any previous visit. Lumbar CT w/wo contrast: No results found for this or any previous visit. Lumbar CT w/wo contrast: No results found for this or any previous visit. Lumbar CT w contrast: No results found for this or any previous visit. Lumbar DG 1V: No results found for this or any previous visit. Lumbar DG 1V (Clearing): No results found for this or any previous visit. Lumbar DG 2-3V (Clearing): No results found for this or any previous visit. Lumbar DG 2-3 views: No results found for this or any previous visit. Lumbar DG (Complete) 4+V: No results found for this or any previous visit. Lumbar DG F/E views: No results found for this or any previous visit. Lumbar DG Bending views: No results found for this or any previous visit. Lumbar DG Myelogram views: No results found for this or any previous visit. Lumbar DG Myelogram: No results found for this or any previous visit. Lumbar DG Myelogram: No results found for this or any previous visit. Lumbar DG Myelogram: No results found for this or any previous visit. Lumbar DG Myelogram Lumbosacral: No results found for this or any previous visit. Lumbar DG Diskogram views: No results found for this or any previous visit. Lumbar DG Diskogram views: No results found for this or any previous visit. Lumbar DG Epidurogram OP: No results found for this or any previous visit. Lumbar DG Epidurogram IP: No results found for this or any previous visit.  Sacroiliac Joint Imaging: Sacroiliac Joint DG: No results found for this or any previous visit. Sacroiliac Joint MR w/wo contrast: No results found for this or any previous visit. Sacroiliac Joint MR wo contrast: No results found for this or any previous visit.  Spine Imaging: Whole Spine DG Myelogram views: No results found for this or any previous visit. Whole Spine MR Mets screen: No results found for this or any previous visit. Whole Spine MR  Mets screen: No results found for this or any previous visit.  Whole Spine MR w/wo: No results found for this or any previous visit. MRA Spinal Canal w/ cm: No results found for this or any previous visit. MRA Spinal Canal wo/ cm: No results found for this or any previous visit. MRA Spinal Canal w/wo cm: No results found for this or any previous visit. Spine Outside MR Films: No results found for this or any previous visit. Spine Outside CT Films: No results found for this or any previous visit. CT-Guided Biopsy: No results found for this or any previous visit. CT-Guided Needle Placement: No results found for this or any previous visit. DG Spine outside: No results found for this or any previous visit. IR Spine outside: No results found for this or any previous visit. NM Spine outside: No results found for this or any previous visit.  Hip Imaging: Hip-R MR w contrast: No results found for this or any previous visit. Hip-L MR w contrast: No results found for this or any previous visit. Hip-R MR w/wo contrast: No results found for this or any previous visit. Hip-L MR w/wo contrast: No results found for this or any previous visit. Hip-R MR wo contrast: No results found for this or any previous visit. Hip-L MR wo contrast: No results found for this or any previous visit. Hip-R CT w contrast: No results found for this or any previous visit. Hip-L CT w contrast: No results found for this or any previous visit. Hip-R CT w/wo contrast: No results found for this or any previous visit. Hip-L CT w/wo contrast: No results found for this or any previous visit. Hip-R CT wo contrast: No results found for this or any previous visit. Hip-L CT wo contrast: No results found for this or any previous visit. Hip-R DG 2-3 views: No results found for this or any previous visit. Hip-L DG 2-3 views: No results found for this or any previous visit. Hip-R DG Arthrogram: No results found for this or any previous  visit. Hip-L DG Arthrogram: No results found for this or any previous visit. Hip-B DG Bilateral: No results found for this or any previous visit.  Knee Imaging: Knee-R MR w contrast: No results found for this or any previous visit. Knee-L MR w contrast: No results found for this or any previous visit. Knee-R MR w/wo contrast: No results found for this or any previous visit. Knee-L MR w/wo contrast: No results found for this or any previous visit. Knee-R MR wo contrast: No results found for this or any previous visit. Knee-L MR wo contrast: No results found for this or any previous visit. Knee-R CT w contrast: No results found for this or any previous visit. Knee-L CT w contrast: No results found for this or any previous visit. Knee-R CT w/wo contrast: No results found for this or any previous visit. Knee-L CT w/wo contrast: No results found for this or any previous visit. Knee-R CT wo contrast: No results found for this or any previous visit. Knee-L CT wo contrast: No results found for this or any previous visit. Knee-R DG 1-2 views: No results found for this or any previous visit. Knee-L DG 1-2 views: No results found for this or any previous visit. Knee-R DG 3 views: No results found for this or any previous visit. Knee-L DG 3 views: No results found for this or any previous visit. Knee-R DG 4 views: No results found for this or any previous visit. Knee-L DG 4 views: No results found for this or any previous visit. Knee-R  DG Arthrogram: No results found for this or any previous visit. Knee-L DG Arthrogram: No results found for this or any previous visit.  Complexity Note: Imaging results reviewed. Results shared with Mr. Dosher, using Layman's terms.                         ROS  Cardiovascular History: {Hx; Cardiovascular History:210120525} Pulmonary or Respiratory History: {Hx; Pumonary and/or Respiratory History:210120523} Neurological History: {Hx; Neurological:210120504} Review  of Past Neurological Studies: No results found for this or any previous visit. Psychological-Psychiatric History: {Hx; Psychological-Psychiatric History:210120512} Gastrointestinal History: {Hx; Gastrointestinal:210120527} Genitourinary History: {Hx; Genitourinary:210120506} Hematological History: {Hx; Hematological:210120510} Endocrine History: {Hx; Endocrine history:210120509} Rheumatologic History: {Hx; Rheumatological:210120530} Musculoskeletal History: {Hx; Musculoskeletal:210120528} Work History: {Hx; Work history:210120514}  Allergies  Mr. Hommes is allergic to atorvastatin; gemfibrozil; metformin and related; questran [cholestyramine]; and rosuvastatin.  Laboratory Chemistry  Inflammation Markers (CRP: Acute Phase) (ESR: Chronic Phase) No results found for: CRP, ESRSEDRATE               Renal Function Markers Lab Results  Component Value Date   BUN 21 06/26/2017   CREATININE 1.23 06/26/2017   GFRAA 54 (L) 05/03/2017   GFRNONAA 47 (L) 05/03/2017                 Hepatic Function Markers Lab Results  Component Value Date   AST 23 04/16/2017   ALT 25 04/16/2017   ALBUMIN 4.3 06/26/2017   ALKPHOS 59 04/16/2017                 Electrolytes Lab Results  Component Value Date   NA 138 06/26/2017   K 4.1 06/26/2017   CL 102 06/26/2017   CALCIUM 10.6 (H) 06/26/2017                 Neuropathy Markers No results found for: [id number]               Bone Pathology Markers Lab Results  Component Value Date   ALKPHOS 59 04/16/2017   CALCIUM 10.6 (H) 06/26/2017                 Rheumatology Markers No results found for: [facility]              Coagulation Parameters Lab Results  Component Value Date   PLT 272 05/03/2017                 Cardiovascular Markers Lab Results  Component Value Date   HGB 14.4 05/03/2017   HCT 43.3 05/03/2017                 CA Markers No results found for: CEA, CA125               Note: Lab results reviewed.  PFSH  Drug: Mr.  Marsiglia  reports that he does not use drugs. Alcohol:  reports that he does not drink alcohol. Tobacco:  reports that  has never smoked. he has never used smokeless tobacco. Medical:  has a past medical history of Anosmia (1980s), Atrial flutter ([place]), CAD (coronary artery disease), Chronic constipation, Chronic insomnia, Chronic lower back pain, Diastolic CHF ([place]), History of diverticulitis, Hyperlipidemia, Hypertension, Hypothyroidism, Obesity, Class II, BMI 35-39.9, with comorbidity, and OSA on CPAP. Family: family history includes Kidney disease in his father; Leukemia in his brother; Stroke in his mother and sister.  Past Surgical History:  Procedure Laterality Date  . NASAL SINUS SURGERY  nasal polyps  . PERCUTANEOUS CORONARY STENT INTERVENTION (PCI-S)     stent placement  . REPLACEMENT TOTAL KNEE BILATERAL Bilateral 2000s  . SPINAL CORD STIMULATOR IMPLANT  06/2016   Active Ambulatory Problems    Diagnosis Date Noted  . History of basal cell carcinoma (BCC) 04/12/2017  . History of squamous cell carcinoma in situ of skin 04/12/2017  . Hypercholesterolemia with hypertriglyceridemia 04/12/2017  . Chronic constipation 04/12/2017  . OSA on CPAP 04/12/2017  . BPH (benign prostatic hyperplasia) 04/12/2017  . Atrial flutter ([place]) 04/15/2017  . Chronic insomnia   . (HFpEF) heart failure with preserved ejection fraction ([place])   . Hypertension   . Hypothyroidism   . Obesity, Class II, BMI 35-39.9, with comorbidity   . CAD (coronary artery disease)   . Chronic lower back pain   . Nasal congestion 06/26/2017   Resolved Ambulatory Problems    Diagnosis Date Noted  . No Resolved Ambulatory Problems   Past Medical History:  Diagnosis Date  . Anosmia 1980s  . Atrial flutter ([place])   . CAD (coronary artery disease)   . Chronic constipation   . Chronic insomnia   . Chronic lower back pain   . Diastolic CHF ([place])   . History of diverticulitis   . Hyperlipidemia   . Hypertension    . Hypothyroidism   . Obesity, Class II, BMI 35-39.9, with comorbidity   . OSA on CPAP    Constitutional Exam  General appearance: Well nourished, well developed, and well hydrated. In no apparent acute distress Vitals:   07/05/17 1138  BP: 135/60  Pulse: 88  Resp: 18  Temp: 98.3 F (36.8 C)  TempSrc: Oral  SpO2: 96%  Weight: 281 lb (127.5 kg)  Height: '6\' 1"'  (1.854 m)   BMI Assessment: Estimated body mass index is 37.07 kg/m as calculated from the following:   Height as of this encounter: '6\' 1"'  (1.854 m).   Weight as of this encounter: 281 lb (127.5 kg).  BMI interpretation table: BMI level Category Range association with higher incidence of chronic pain  <18 kg/m2 Underweight   18.5-24.9 kg/m2 Ideal body weight   25-29.9 kg/m2 Overweight Increased incidence by 20%  30-34.9 kg/m2 Obese (Class I) Increased incidence by 68%  35-39.9 kg/m2 Severe obesity (Class II) Increased incidence by 136%  >40 kg/m2 Extreme obesity (Class III) Increased incidence by 254%   BMI Readings from Last 4 Encounters:  07/05/17 37.07 kg/m  06/27/17 36.94 kg/m  06/26/17 37.60 kg/m  06/21/17 34.20 kg/m   Wt Readings from Last 4 Encounters:  07/05/17 281 lb (127.5 kg)  06/27/17 280 lb (127 kg)  06/26/17 285 lb (129.3 kg)  06/21/17 281 lb (127.5 kg)  Psych/Mental status: Alert, oriented x 3 (person, place, & time)       Eyes: PERLA Respiratory: No evidence of acute respiratory distress  Cervical Spine Area Exam  Skin & Axial Inspection: No masses, redness, edema, swelling, or associated skin lesions Alignment: Symmetrical Functional ROM: Unrestricted ROM      Stability: No instability detected Muscle Tone/Strength: Functionally intact. No obvious neuro-muscular anomalies detected. Sensory (Neurological): Unimpaired Palpation: No palpable anomalies              Upper Extremity (UE) Exam    Side: Right upper extremity  Side: Left upper extremity  Skin & Extremity Inspection: Skin  color, temperature, and hair growth are WNL. No peripheral edema or cyanosis. No masses, redness, swelling, asymmetry, or associated skin lesions. No contractures.  Skin & Extremity Inspection: Skin color, temperature, and hair growth are WNL. No peripheral edema or cyanosis. No masses, redness, swelling, asymmetry, or associated skin lesions. No contractures.  Functional ROM: Unrestricted ROM          Functional ROM: Unrestricted ROM          Muscle Tone/Strength: Functionally intact. No obvious neuro-muscular anomalies detected.  Muscle Tone/Strength: Functionally intact. No obvious neuro-muscular anomalies detected.  Sensory (Neurological): Unimpaired          Sensory (Neurological): Unimpaired          Palpation: No palpable anomalies              Palpation: No palpable anomalies              Specialized Test(s): Deferred         Specialized Test(s): Deferred          Thoracic Spine Area Exam  Skin & Axial Inspection: No masses, redness, or swelling Alignment: Symmetrical Functional ROM: Unrestricted ROM Stability: No instability detected Muscle Tone/Strength: Functionally intact. No obvious neuro-muscular anomalies detected. Sensory (Neurological): Unimpaired Muscle strength & Tone: No palpable anomalies  Lumbar Spine Area Exam  Skin & Axial Inspection: No masses, redness, or swelling Alignment: Symmetrical Functional ROM: Unrestricted ROM      Stability: No instability detected Muscle Tone/Strength: Functionally intact. No obvious neuro-muscular anomalies detected. Sensory (Neurological): Unimpaired Palpation: No palpable anomalies       Provocative Tests: Lumbar Hyperextension and rotation test: evaluation deferred today       Lumbar Lateral bending test: evaluation deferred today       Patrick's Maneuver: evaluation deferred today                    Gait & Posture Assessment  Ambulation: Unassisted Gait: Relatively normal for age and body habitus Posture: WNL   Lower  Extremity Exam    Side: Right lower extremity  Side: Left lower extremity  Skin & Extremity Inspection: Skin color, temperature, and hair growth are WNL. No peripheral edema or cyanosis. No masses, redness, swelling, asymmetry, or associated skin lesions. No contractures.  Skin & Extremity Inspection: Skin color, temperature, and hair growth are WNL. No peripheral edema or cyanosis. No masses, redness, swelling, asymmetry, or associated skin lesions. No contractures.  Functional ROM: Unrestricted ROM          Functional ROM: Unrestricted ROM          Muscle Tone/Strength: Functionally intact. No obvious neuro-muscular anomalies detected.  Muscle Tone/Strength: Functionally intact. No obvious neuro-muscular anomalies detected.  Sensory (Neurological): Unimpaired  Sensory (Neurological): Unimpaired  Palpation: No palpable anomalies  Palpation: No palpable anomalies   Assessment  Primary Diagnosis & Pertinent Problem List: The primary encounter diagnosis was Spondylosis without myelopathy or radiculopathy, lumbar region. Diagnoses of Lumbar facet arthropathy, Chronic left-sided low back pain without sciatica, and Lumbar degenerative disc disease were also pertinent to this visit.  Visit Diagnosis (New problems to examiner): 1. Spondylosis without myelopathy or radiculopathy, lumbar region   2. Lumbar facet arthropathy   3. Chronic left-sided low back pain without sciatica   4. Lumbar degenerative disc disease    Plan of Care (Initial workup plan)  Note: Please be advised that as per protocol, today's visit has been an evaluation only. We have not taken over the patient's controlled substance management.  Problem-specific plan: No problem-specific Assessment & Plan notes found for this encounter.  Ordered Lab-work, Procedure(s), Referral(s), & Consult(s): Orders  Placed This Encounter  Procedures  . Radiofrequency,Lumbar   Pharmacotherapy (current): Medications ordered:  No orders of the  defined types were placed in this encounter.  Medications administered during this visit: Nobel L. Lokey had no medications administered during this visit.   Pharmacological management options:  Opioid Analgesics: The patient was informed that there is no guarantee that he would be a candidate for opioid analgesics. The decision will be made following CDC guidelines. This decision will be based on the results of diagnostic studies, as well as Mr. Wahlstrom risk profile.   Membrane stabilizer: To be determined at a later time  Muscle relaxant: To be determined at a later time  NSAID: To be determined at a later time  Other analgesic(s): To be determined at a later time   Interventional management options: Mr. Steffler was informed that there is no guarantee that he would be a candidate for interventional therapies. The decision will be based on the results of diagnostic studies, as well as Mr. Aber risk profile.  Procedure(s) under consideration:  ***   Provider-requested follow-up: No Follow-up on file.  Future Appointments  Date Time Provider [place]  07/09/2017  8:00 AM Gillis Santa, MD [facility] None  07/24/2017  2:00 PM [facility]  10/11/2017 10:30 AM Eustace Pen, LPN [facility]  0/22/8406 11:30 AM Ria Bush, MD [facility]    Primary Care Physician: Ria Bush, MD Location: [facility] Note by: Gillis Santa, M.D, Date: 07/05/2017; Time: 11:58 AM  There are no Patient Instructions on file for this visit.

## 2017-07-09 ENCOUNTER — Other Ambulatory Visit: Payer: Self-pay

## 2017-07-09 ENCOUNTER — Ambulatory Visit: Payer: Medicare Other

## 2017-07-09 ENCOUNTER — Ambulatory Visit (HOSPITAL_BASED_OUTPATIENT_CLINIC_OR_DEPARTMENT_OTHER): Payer: Medicare Other | Admitting: Student in an Organized Health Care Education/Training Program

## 2017-07-09 ENCOUNTER — Ambulatory Visit
Admission: RE | Admit: 2017-07-09 | Discharge: 2017-07-09 | Disposition: A | Discharge status: Home / Self Care | Payer: Medicare Other | Source: Ambulatory Visit | Attending: Student in an Organized Health Care Education/Training Program | Admitting: Student in an Organized Health Care Education/Training Program

## 2017-07-09 ENCOUNTER — Encounter: Payer: Self-pay | Admitting: Student in an Organized Health Care Education/Training Program

## 2017-07-09 VITALS — BP 121/72 | HR 79 | Temp 96.8°F | Resp 17 | Ht 73.0 in | Wt 281.0 lb

## 2017-07-09 DIAGNOSIS — Z9689 Presence of other specified functional implants: Secondary | ICD-10-CM | POA: Diagnosis not present

## 2017-07-09 DIAGNOSIS — Z955 Presence of coronary angioplasty implant and graft: Secondary | ICD-10-CM | POA: Insufficient documentation

## 2017-07-09 DIAGNOSIS — M545 Low back pain: Secondary | ICD-10-CM | POA: Insufficient documentation

## 2017-07-09 DIAGNOSIS — M5136 Other intervertebral disc degeneration, lumbar region: Secondary | ICD-10-CM | POA: Insufficient documentation

## 2017-07-09 DIAGNOSIS — Z96653 Presence of artificial knee joint, bilateral: Secondary | ICD-10-CM | POA: Diagnosis not present

## 2017-07-09 DIAGNOSIS — M47816 Spondylosis without myelopathy or radiculopathy, lumbar region: Secondary | ICD-10-CM | POA: Diagnosis not present

## 2017-07-09 DIAGNOSIS — G8929 Other chronic pain: Secondary | ICD-10-CM

## 2017-07-09 MED ORDER — LIDOCAINE HCL (PF) 1 % IJ SOLN
10.0000 mL | Freq: Once | INTRAMUSCULAR | Status: AC
  Administered 2017-07-09: 5 mL
  Filled 2017-07-09: qty 10

## 2017-07-09 MED ORDER — FENTANYL CITRATE (PF) 100 MCG/2ML IJ SOLN
25.0000 ug | INTRAMUSCULAR | Status: DC | PRN
Start: 2017-07-09 — End: 2017-07-09
  Administered 2017-07-09: 50 ug via INTRAVENOUS
  Filled 2017-07-09: qty 2

## 2017-07-09 MED ORDER — LACTATED RINGERS IV SOLN
1000.0000 mL | Freq: Once | INTRAVENOUS | Status: AC
  Administered 2017-07-09: 1000 mL via INTRAVENOUS

## 2017-07-09 MED ORDER — DEXAMETHASONE SODIUM PHOSPHATE 10 MG/ML IJ SOLN
10.0000 mg | Freq: Once | INTRAMUSCULAR | Status: AC
  Administered 2017-07-09: 10 mg
  Filled 2017-07-09: qty 1

## 2017-07-09 MED ORDER — ROPIVACAINE HCL 2 MG/ML IJ SOLN
10.0000 mL | Freq: Once | INTRAMUSCULAR | Status: AC
  Administered 2017-07-09: 10 mL
  Filled 2017-07-09: qty 10

## 2017-07-09 NOTE — Progress Notes (Signed)
Patient's Name: Anthony [name]  MRN: [id number]  Referring Provider: Ria Bush, MD  DOB: 1936-02-24  PCP: Ria Bush, MD  DOS: 07/09/2017  Note by: Gillis Santa, MD  Service setting: Ambulatory outpatient  Specialty: Interventional Pain Management  Patient type: Established  Location: [facility] (AMB) Pain Management Facility  Visit type: Interventional Procedure   Primary Reason for Visit: Interventional Pain Management Treatment. CC: Back Pain (low left)  Procedure:  Anesthesia, Analgesia, Anxiolysis:  Type: Therapeutic Medial Branch Facet Radiofrequency Ablation Region: Lumbar Level:  L3, L4, L5, Medial Branch Level(s) Laterality: Left-Sided  Type: Local Anesthesia with Moderate (Conscious) Sedation Local Anesthetic: Lidocaine 1% Route: Intravenous (IV) IV Access: Secured Sedation: Meaningful verbal contact was maintained at all times during the procedure  Indication(s): Analgesia and Anxiety   Indications: 1. Spondylosis without myelopathy or radiculopathy, lumbar region   2. Lumbar facet arthropathy   3. Chronic left-sided low back pain without sciatica   4. Lumbar degenerative disc disease    Anthony [name] has either failed to respond, was unable to tolerate, or simply did not get enough benefit from other more conservative therapies including, but not limited to: 1. Over-the-counter medications 2. Anti-inflammatory medications 3. Muscle relaxants 4. Membrane stabilizers 5. Opioids 6. Physical therapy 7. Modalities (Heat, ice, etc.) 8. Invasive techniques such as nerve blocks. Anthony [name] has attained more than 50% relief of the pain from a series of diagnostic injections conducted in separate occasions.  Pain Score: Pre-procedure: 2 /10 Post-procedure: 0-No pain/10  Patient has been instructed by his cardiologist to remain off of Eliquis for the time being.  Pre-op Assessment:  Anthony [name] is a 81 y.o. (year old), male patient, seen today for interventional  treatment. He  has a past surgical history that includes Percutaneous coronary stent intervention (pci-s); Replacement total knee bilateral (Bilateral, 2000s); Spinal cord stimulator implant (06/2016); Nasal sinus surgery; and A-Flutter Ablation (N/A, 05/07/2017). Anthony [name] has a current medication list which includes the following prescription(s): aspirin, diphenhydramine-acetaminophen, docusate sodium, eszopiclone, ezetimibe, fenofibrate, finasteride, furosemide, hydrocodone-acetaminophen, ketoconazole, levothyroxine, linaclotide, lisinopril, loratadine, metoprolol succinate, multivitamin, multivitamin-lutein, naloxegol oxalate, nitroglycerin, omega-3 acid ethyl esters, potassium chloride, tamsulosin, and tizanidine, and the following Facility-Administered Medications: fentanyl and lactated ringers. His primarily concern today is the Back Pain (low left)  Initial Vital Signs: There were no vitals taken for this visit. BMI: Estimated body mass index is 37.07 kg/m as calculated from the following:   Height as of this encounter: 6\' 1"  (1.854 m).   Weight as of this encounter: 281 lb (127.5 kg).  Risk Assessment: Allergies: Reviewed. He is allergic to atorvastatin; gemfibrozil; metformin and related; questran [cholestyramine]; and rosuvastatin.  Allergy Precautions: None required Coagulopathies: Reviewed. None identified.  Blood-thinner therapy: None at this time Active Infection(s): Reviewed. None identified. Anthony [name] is afebrile  Site Confirmation: Anthony [name] was asked to confirm the procedure and laterality before marking the site Procedure checklist: Completed Consent: Before the procedure and under the influence of no sedative(s), amnesic(s), or anxiolytics, the patient was informed of the treatment options, risks and possible complications. To fulfill our ethical and legal obligations, as recommended by the American Medical Association's Code of Ethics, I have informed the patient of my  clinical impression; the nature and purpose of the treatment or procedure; the risks, benefits, and possible complications of the intervention; the alternatives, including doing nothing; the risk(s) and benefit(s) of the alternative treatment(s) or procedure(s); and the risk(s) and benefit(s) of doing nothing. The patient was provided information about the  general risks and possible complications associated with the procedure. These may include, but are not limited to: failure to achieve desired goals, infection, bleeding, organ or nerve damage, allergic reactions, paralysis, and death. In addition, the patient was informed of those risks and complications associated to Spine-related procedures, such as failure to decrease pain; infection (i.e.: Meningitis, epidural or intraspinal abscess); bleeding (i.e.: epidural hematoma, subarachnoid hemorrhage, or any other type of intraspinal or peri-dural bleeding); organ or nerve damage (i.e.: Any type of peripheral nerve, nerve root, or spinal cord injury) with subsequent damage to sensory, motor, and/or autonomic systems, resulting in permanent pain, numbness, and/or weakness of one or several areas of the body; allergic reactions; (i.e.: anaphylactic reaction); and/or death. Furthermore, the patient was informed of those risks and complications associated with the medications. These include, but are not limited to: allergic reactions (i.e.: anaphylactic or anaphylactoid reaction(s)); adrenal axis suppression; blood sugar elevation that in diabetics may result in ketoacidosis or comma; water retention that in patients with history of congestive heart failure may result in shortness of breath, pulmonary edema, and decompensation with resultant heart failure; weight gain; swelling or edema; medication-induced neural toxicity; particulate matter embolism and blood vessel occlusion with resultant organ, and/or nervous system infarction; and/or aseptic necrosis of one or  more joints. Finally, the patient was informed that Medicine is not an exact science; therefore, there is also the possibility of unforeseen or unpredictable risks and/or possible complications that may result in a catastrophic outcome. The patient indicated having understood very clearly. We have given the patient no guarantees and we have made no promises. Enough time was given to the patient to ask questions, all of which were answered to the patient's satisfaction. Mr. Haugen has indicated that he wanted to continue with the procedure. Attestation: I, the ordering provider, attest that I have discussed with the patient the benefits, risks, side-effects, alternatives, likelihood of achieving goals, and potential problems during recovery for the procedure that I have provided informed consent. Date: 07/09/2017; Time: 8:02 AM  Pre-Procedure Preparation:  Monitoring: As per clinic protocol. Respiration, ETCO2, SpO2, BP, heart rate and rhythm monitor placed and checked for adequate function Safety Precautions: Patient was assessed for positional comfort and pressure points before starting the procedure. Time-out: I initiated and conducted the "Time-out" before starting the procedure, as per protocol. The patient was asked to participate by confirming the accuracy of the "Time Out" information. Verification of the correct person, site, and procedure were performed and confirmed by me, the nursing staff, and the patient. "Time-out" conducted as per Joint Commission's Universal Protocol (UP.01.01.01). "Time-out" Date & Time: 07/09/2017; 0840 hrs.  Description of Procedure Process:   Position: Prone Target Area: For Lumbar Facet blocks, the target is the groove formed by the junction of the transverse process and superior articular process. For the L5 dorsal ramus, the target is the notch between superior articular process and sacral ala.  Approach: Paraspinal approach. Area Prepped: Entire Posterior  Lumbosacral Region Prepping solution: Hibiclens (4.0% Chlorhexidine gluconate solution) Safety Precautions: Aspiration looking for blood return was conducted prior to all injections. At no point did we inject any substances, as a needle was being advanced. No attempts were made at seeking any paresthesias. Safe injection practices and needle disposal techniques used. Medications properly checked for expiration dates. SDV (single dose vial) medications used. Description of the Procedure: Protocol guidelines were followed. The patient was placed in position over the procedure table. The target area was identified and the area prepped  in the usual manner. The skin and muscle were infiltrated with local anesthetic. Appropriate amount of time allowed to pass for local anesthetics to take effect. Radiofrequency needles were introduced to the target area using fluoroscopic guidance. Using the NeuroTherm NT1100 Radiofrequency Generator, sensory stimulation using 50 Hz was used to locate & identify the nerve, making sure that the needle was positioned such that there was no sensory stimulation below 0.3 V or above 0.7 V. Stimulation using 2 Hz was used to evaluate the motor component. Care was taken not to lesion any nerves that demonstrated motor stimulation of the lower extremities at an output of less than 2.5 times that of the sensory threshold, or a maximum of 2.0 V. Once satisfactory placement of the needles was achieved, the numbing solution was slowly injected after negative aspiration. After waiting for at least 2 minutes, the ablation was performed at 80 degrees C for 60 seconds, using regular Radiofrequency settings. Once the procedure was completed, the needles were then removed and the area cleansed, making sure to leave some of the prepping solution back to take advantage of its long term bactericidal properties. Intra-operative Compliance: Compliant  Illustration of the posterior view of the lumbar spine  and the posterior neural structures. Laminae of L2 through S1 are labeled. DPRL5, dorsal primary ramus of L5; DPRS1, dorsal primary ramus of S1; DPR3, dorsal primary ramus of L3; FJ, facet (zygapophyseal) joint L3-L4; I, inferior articular process of L4; LB1, lateral branch of dorsal primary ramus of L1; IAB, inferior articular branches from L3 medial branch (supplies L4-L5 facet joint); IBP, intermediate branch plexus; MB3, medial branch of dorsal primary ramus of L3; NR3, third lumbar nerve root; S, superior articular process of L5; SAB, superior articular branches from L4 (supplies L4-5 facet joint also); TP3, transverse process of L3.  Vitals:   07/09/17 0859 07/09/17 0904 07/09/17 0906 07/09/17 0916  BP: 114/77 114/69 114/86 132/64  Pulse:      Resp: 18 19 (!) 27 12  Temp:      SpO2: 94% 96% 95% 94%  Weight:      Height:        Start Time: 0841 hrs. End Time: 0905 hrs. Materials & Medications:  Needle(s) Type: Teflon-coated, curved tip, Radiofrequency needle(s) Gauge: 22G Length: 10cm Medication(s): We administered fentaNYL, lactated ringers, lidocaine (PF), ropivacaine (PF) 2 mg/mL (0.2%), and dexamethasone. Please see chart orders for dosing details. 0.33 cc of Decadron 10 mg per cc injected at each level and flushed with 0.2% ropivacaine and cannulas removed. Imaging Guidance (Spinal):  Type of Imaging Technique: Fluoroscopy Guidance (Spinal) Indication(s): Assistance in needle guidance and placement for procedures requiring needle placement in or near specific anatomical locations not easily accessible without such assistance. Exposure Time: Please see nurses notes. Contrast: None used. Fluoroscopic Guidance: I was personally present during the use of fluoroscopy. "Tunnel Vision Technique" used to obtain the best possible view of the target area. Parallax error corrected before commencing the procedure. "Direction-depth-direction" technique used to introduce the needle under  continuous pulsed fluoroscopy. Once target was reached, antero-posterior, oblique, and lateral fluoroscopic projection used confirm needle placement in all planes. Images permanently stored in EMR. Interpretation: No contrast injected. I personally interpreted the imaging intraoperatively. Adequate needle placement confirmed in multiple planes. Permanent images saved into the patient's record.  Antibiotic Prophylaxis:  Indication(s): None identified Antibiotic given: None  Post-operative Assessment:  EBL: None Complications: No immediate post-treatment complications observed by team, or reported by patient. Note: The patient tolerated  the entire procedure well. A repeat set of vitals were taken after the procedure and the patient was kept under observation following institutional policy, for this type of procedure. Post-procedural neurological assessment was performed, showing return to baseline, prior to discharge. The patient was provided with post-procedure discharge instructions, including a section on how to identify potential problems. Should any problems arise concerning this procedure, the patient was given instructions to immediately contact us, at any time, without hesitation. In any case, we plan to contact the patient by telephone for a follow-up status report regarding this interventional procedure. Comments:  No additional relevant information. 5 out of 5 strength bilateral lower extremity: Plantar flexion, dorsiflexion, knee flexion, knee extension.  Plan of Care  Follow-up in 4-6 weeks for postprocedural evaluation.    Imaging Orders     DG C-Arm 1-60 Min-No Report Procedure Orders    No procedure(s) ordered today    Medications ordered for procedure: Meds ordered this encounter  Medications  . fentaNYL (SUBLIMAZE) injection 25-100 mcg    Make sure Narcan is available in the pyxis when using this medication. In the event of respiratory depression (RR< 8/min): Titrate  NARCAN (naloxone) in increments of 0.1 to 0.2 mg IV at 2-3 minute intervals, until desired degree of reversal.  . lactated ringers infusion 1,000 mL  . lidocaine (PF) (XYLOCAINE) 1 % injection 10 mL  . ropivacaine (PF) 2 mg/mL (0.2%) (NAROPIN) injection 10 mL  . dexamethasone (DECADRON) injection 10 mg   Medications administered: We administered fentaNYL, lactated ringers, lidocaine (PF), ropivacaine (PF) 2 mg/mL (0.2%), and dexamethasone.  See the medical record for exact dosing, route, and time of administration.  This SmartLink is deprecated. Use AVSMEDLIST instead to display the medication list for a patient. Disposition: Discharge home  Discharge Date & Time: 07/09/2017; 0940 hrs.   Physician-requested Follow-up: Return in about 4 weeks (around 08/06/2017) for Post Procedure Evaluation. Future Appointments  Date Time Provider [place]  07/24/2017  2:00 PM [facility]  08/07/2017  8:45 AM Gillis Santa, MD [facility] None  10/11/2017 10:30 AM Eustace Pen, LPN [facility]  0/[id number]/7619 11:30 AM Ria Bush, MD [facility]   Primary Care Physician: Ria Bush, MD Location: [facility] Note by: Gillis Santa, MD Date: 07/09/2017; Time: 9:23 AM  Disclaimer:  Medicine is not an exact science. The only guarantee in medicine is that nothing is guaranteed. It is important to note that the decision to proceed with this intervention was based on the information collected from the patient. The Data and conclusions were drawn from the patient's questionnaire, the interview, and the physical examination. Because the information was provided in large part by the patient, it cannot be guaranteed that it has not been purposely or unconsciously manipulated. Every effort has been made to obtain as much relevant data as possible for this evaluation. It is important to note that the conclusions that lead to this procedure are  derived in large part from the available data. Always take into account that the treatment will also be dependent on availability of resources and existing treatment guidelines, considered by other Pain Management Practitioners as being common knowledge and practice, at the time of the intervention. For Medico-Legal purposes, it is also important to point out that variation in procedural techniques and pharmacological choices are the acceptable norm. The indications, contraindications, technique, and results of the above procedure should only be interpreted and judged by a Board-Certified Interventional Pain Specialist with  extensive familiarity and expertise in the same exact procedure and technique.

## 2017-07-09 NOTE — Patient Instructions (Addendum)
Radiofrequency Lesioning, Care After Refer to this sheet in the next few weeks. These instructions provide you with information about caring for yourself after your procedure. Your health care provider may also give you more specific instructions. Your treatment has been planned according to current medical practices, but problems sometimes occur. Call your health care provider if you have any problems or questions after your procedure. What can I expect after the procedure? After the procedure, it is common to have:  Pain from the burned nerve.  Temporary numbness.  Follow these instructions at home:  Take over-the-counter and prescription medicines only as told by your health care provider.  Return to your normal activities as told by your health care provider. Ask your health care provider what activities are safe for you.  Pay close attention to how you feel after the procedure. If you start to have pain, write down when it hurts and how it feels. This will help you and your health care provider to know if you need an additional treatment.  Check your needle insertion site every day for signs of infection. Watch for: ? Redness, swelling, or pain. ? Fluid, blood, or pus.  Keep all follow-up visits as told by your health care provider. This is important. Contact a health care provider if:  Your pain does not get better.  You have redness, swelling, or pain at the needle insertion site.  You have fluid, blood, or pus coming from the needle insertion site.  You have a fever. Get help right away if:  You develop sudden, severe pain.  You develop numbness or tingling near the procedure site that does not go away. This information is not intended to replace advice given to you by your health care provider. Make sure you discuss any questions you have with your health care provider. Document Released: 04/13/2011 Document Revised: 01/20/2016 Document Reviewed: 09/21/2014 Elsevier  Interactive Patient Education  2018 [place]. Pain Management Discharge Instructions  General Discharge Instructions :  If you need to reach your doctor call: Monday-Friday 8:00 am - 4:00 pm at [phone] or toll free [phone].  After clinic hours [phone] to have operator reach doctor.  Bring all of your medication bottles to all your appointments in the pain clinic.  To cancel or reschedule your appointment with Pain Management please remember to call 24 hours in advance to avoid a fee.  Refer to the educational materials which you have been given on: General Risks, I had my Procedure. Discharge Instructions, Post Sedation.  Post Procedure Instructions:  The drugs you were given will stay in your system until tomorrow, so for the next 24 hours you should not drive, make any legal decisions or drink any alcoholic beverages.  You may eat anything you prefer, but it is better to start with liquids then soups and crackers, and gradually work up to solid foods.  Please notify your doctor immediately if you have any unusual bleeding, trouble breathing or pain that is not related to your normal pain.  Depending on the type of procedure that was done, some parts of your body may feel week and/or numb.  This usually clears up by tonight or the next day.  Walk with the use of an assistive device or accompanied by an adult for the 24 hours.  You may use ice on the affected area for the first 24 hours.  Put ice in a Ziploc bag and cover with a towel and place against area 15 minutes on 15  minutes off.  You may switch to heat after 24 hours.

## 2017-07-09 NOTE — Progress Notes (Signed)
Safety precautions to be maintained throughout the outpatient stay will include: orient to surroundings, keep bed in low position, maintain call bell within reach at all times, provide assistance with transfer out of bed and ambulation.  

## 2017-07-11 ENCOUNTER — Encounter: Payer: Medicare Other | Admitting: Family Medicine

## 2017-07-24 ENCOUNTER — Ambulatory Visit (INDEPENDENT_AMBULATORY_CARE_PROVIDER_SITE_OTHER): Payer: Medicare Other

## 2017-07-24 DIAGNOSIS — Z23 Encounter for immunization: Secondary | ICD-10-CM | POA: Diagnosis not present

## 2017-08-07 ENCOUNTER — Encounter: Payer: Self-pay | Admitting: Student in an Organized Health Care Education/Training Program

## 2017-08-07 ENCOUNTER — Ambulatory Visit: Payer: Medicare Other | Admitting: Student in an Organized Health Care Education/Training Program

## 2017-08-07 ENCOUNTER — Other Ambulatory Visit: Payer: Self-pay

## 2017-08-07 ENCOUNTER — Ambulatory Visit
Payer: Medicare Other | Attending: Student in an Organized Health Care Education/Training Program | Admitting: Student in an Organized Health Care Education/Training Program

## 2017-08-07 VITALS — BP 125/90 | HR 91 | Temp 98.1°F | Resp 18 | Ht 73.0 in | Wt 285.0 lb

## 2017-08-07 DIAGNOSIS — M549 Dorsalgia, unspecified: Secondary | ICD-10-CM | POA: Diagnosis present

## 2017-08-07 DIAGNOSIS — Z9889 Other specified postprocedural states: Secondary | ICD-10-CM | POA: Insufficient documentation

## 2017-08-07 DIAGNOSIS — M47816 Spondylosis without myelopathy or radiculopathy, lumbar region: Secondary | ICD-10-CM

## 2017-08-07 DIAGNOSIS — M5136 Other intervertebral disc degeneration, lumbar region: Secondary | ICD-10-CM

## 2017-08-07 DIAGNOSIS — Z955 Presence of coronary angioplasty implant and graft: Secondary | ICD-10-CM | POA: Diagnosis not present

## 2017-08-07 DIAGNOSIS — Z79899 Other long term (current) drug therapy: Secondary | ICD-10-CM | POA: Diagnosis not present

## 2017-08-07 DIAGNOSIS — I110 Hypertensive heart disease with heart failure: Secondary | ICD-10-CM | POA: Insufficient documentation

## 2017-08-07 DIAGNOSIS — Z9689 Presence of other specified functional implants: Secondary | ICD-10-CM | POA: Diagnosis not present

## 2017-08-07 DIAGNOSIS — E781 Pure hyperglyceridemia: Secondary | ICD-10-CM | POA: Diagnosis not present

## 2017-08-07 DIAGNOSIS — E039 Hypothyroidism, unspecified: Secondary | ICD-10-CM | POA: Diagnosis not present

## 2017-08-07 DIAGNOSIS — G4733 Obstructive sleep apnea (adult) (pediatric): Secondary | ICD-10-CM | POA: Diagnosis not present

## 2017-08-07 DIAGNOSIS — N400 Enlarged prostate without lower urinary tract symptoms: Secondary | ICD-10-CM | POA: Diagnosis not present

## 2017-08-07 DIAGNOSIS — G8929 Other chronic pain: Secondary | ICD-10-CM

## 2017-08-07 DIAGNOSIS — I2510 Atherosclerotic heart disease of native coronary artery without angina pectoris: Secondary | ICD-10-CM | POA: Diagnosis not present

## 2017-08-07 DIAGNOSIS — E7800 Pure hypercholesterolemia, unspecified: Secondary | ICD-10-CM | POA: Insufficient documentation

## 2017-08-07 DIAGNOSIS — Z7982 Long term (current) use of aspirin: Secondary | ICD-10-CM | POA: Insufficient documentation

## 2017-08-07 DIAGNOSIS — M545 Low back pain: Secondary | ICD-10-CM

## 2017-08-07 DIAGNOSIS — I5032 Chronic diastolic (congestive) heart failure: Secondary | ICD-10-CM | POA: Diagnosis not present

## 2017-08-07 DIAGNOSIS — K5909 Other constipation: Secondary | ICD-10-CM | POA: Insufficient documentation

## 2017-08-07 MED ORDER — HYDROCODONE-ACETAMINOPHEN 5-325 MG PO TABS: 1.0000 | ORAL_TABLET | Freq: Three times a day (TID) | ORAL | 0 refills | Status: DC | PRN

## 2017-08-07 NOTE — Progress Notes (Signed)
Safety precautions to be maintained throughout the outpatient stay will include: orient to surroundings, keep bed in low position, maintain call bell within reach at all times, provide assistance with transfer out of bed and ambulation.  

## 2017-08-07 NOTE — Progress Notes (Signed)
Patient's Name: Anthony [name]  MRN: [id number]  Referring Provider: Ria Bush, MD  DOB: 16-Dec-1935  PCP: Ria Bush, MD  DOS: 08/07/2017  Note by: Gillis Santa, MD  Service setting: Ambulatory outpatient  Specialty: Interventional Pain Management  Location: [facility] (AMB) Pain Management Facility    Patient type: Established   Primary Reason(s) for Visit: Encounter for post-procedure evaluation of chronic illness with mild to moderate exacerbation CC: Back Pain (low and equal bilaterally)  HPI  Anthony [name] is a 81 y.o. year old, male patient, who comes today for a post-procedure evaluation. He has History of basal cell carcinoma (BCC); History of squamous cell carcinoma in situ of skin; Hypercholesterolemia with hypertriglyceridemia; Chronic constipation; OSA on CPAP; BPH (benign prostatic hyperplasia); Atrial flutter ([place]); Chronic insomnia; (HFpEF) heart failure with preserved ejection fraction ([place]); Hypertension; Hypothyroidism; Obesity, Class II, BMI 35-39.9, with comorbidity; CAD (coronary artery disease); Chronic lower back pain; and Nasal congestion on their problem list. His primarily concern today is the Back Pain (low and equal bilaterally)  Pain Assessment: Location: Lower Back Radiating: left leg Onset: More than a month ago Duration: Chronic pain Quality: Dull, Constant Severity: 1 /10 (self-reported pain score)  Note: Reported level is compatible with observation.                         When using our objective Pain Scale, levels between 6 and 10/10 are said to belong in an emergency room, as it progressively worsens from a 6/10, described as severely limiting, requiring emergency care not usually available at an outpatient pain management facility. At a 6/10 level, communication becomes difficult and requires great effort. Assistance to reach the emergency department may be required. Facial flushing and profuse sweating along with potentially dangerous increases in heart  rate and blood pressure will be evident. Effect on ADL:   Timing: Constant Modifying factors: sitting, procedures, heat  Anthony [name] comes in today for post-procedure evaluation after the treatment done on 07/09/2017.  Further details on both, my assessment(s), as well as the proposed treatment plan, please see below.  Post-Procedure Assessment  07/09/2017 Procedure: Left L3-L5 radiofrequency ablation Pre-procedure pain score:  2/10 Post-procedure pain score: 0/10         Influential Factors: BMI: 37.60 kg/m Intra-procedural challenges: None observed.         Assessment challenges: None detected.              Reported side-effects: None.        Post-procedural adverse reactions or complications: None reported         Sedation: Please see nurses note. When no sedatives are used, the analgesic levels obtained are directly associated to the effectiveness of the local anesthetics. However, when sedation is provided, the level of analgesia obtained during the initial 1 hour following the intervention, is believed to be the result of a combination of factors. These factors may include, but are not limited to: 1. The effectiveness of the local anesthetics used. 2. The effects of the analgesic(s) and/or anxiolytic(s) used. 3. The degree of discomfort experienced by the patient at the time of the procedure. 4. The patients ability and reliability in recalling and recording the events. 5. The presence and influence of possible secondary gains and/or psychosocial factors. Reported result: Relief experienced during the 1st hour after the procedure: 100 % (Ultra-Short Term Relief)            Interpretative annotation: Clinically appropriate result. Analgesia during  this period is likely to be Local Anesthetic and/or IV Sedative (Analgesic/Anxiolytic) related.          Effects of local anesthetic: The analgesic effects attained during this period are directly associated to the localized infiltration  of local anesthetics and therefore cary significant diagnostic value as to the etiological location, or anatomical origin, of the pain. Expected duration of relief is directly dependent on the pharmacodynamics of the local anesthetic used. Long-acting (4-6 hours) anesthetics used.  Reported result: Relief during the next 4 to 6 hour after the procedure: 50 % (Short-Term Relief)            Interpretative annotation: Clinically appropriate result. Analgesia during this period is likely to be Local Anesthetic-related.          Long-term benefit: Defined as the period of time past the expected duration of local anesthetics (1 hour for short-acting and 4-6 hours for long-acting). With the possible exception of prolonged sympathetic blockade from the local anesthetics, benefits during this period are typically attributed to, or associated with, other factors such as analgesic sensory neuropraxia, antiinflammatory effects, or beneficial biochemical changes provided by agents other than the local anesthetics.  Reported result: Extended relief following procedure: 50 % (Long-Term Relief)            Interpretative annotation: Clinically appropriate result. Good relief. Therapeutic success. Inflammation plays a part in the etiology to the pain.          Current benefits: Defined as reported results that persistent at this point in time.   Analgesia: 50 % Anthony [name] reports improvement of axial symptoms. Function: Somewhat improved ROM: Somewhat improved Interpretative annotation: Ongoing benefit. Therapeutic benefit observed. Effective therapeutic approach.          Interpretation: Results would suggest a successful therapeutic intervention.                  Plan:  Please see "Plan of Care" for details.        Laboratory Chemistry  Inflammation Markers (CRP: Acute Phase) (ESR: Chronic Phase) No results found for: CRP, ESRSEDRATE, LATICACIDVEN               Rheumatology Markers No results found for: RF,  ANA, LABURIC, URICUR, LYMEIGGIGMAB, LYMEABIGMQN              Renal Function Markers Lab Results  Component Value Date   BUN 21 06/26/2017   CREATININE 1.23 06/26/2017   GFRAA 54 (L) 05/03/2017   GFRNONAA 47 (L) 05/03/2017                 Hepatic Function Markers Lab Results  Component Value Date   AST 23 04/16/2017   ALT 25 04/16/2017   ALBUMIN 4.3 06/26/2017   ALKPHOS 59 04/16/2017                 Electrolytes Lab Results  Component Value Date   NA 138 06/26/2017   K 4.1 06/26/2017   CL 102 06/26/2017   CALCIUM 10.6 (H) 06/26/2017   PHOS 3.0 06/26/2017                 Neuropathy Markers No results found for: VITAMINB12, FOLATE, HGBA1C, HIV               Bone Pathology Markers No results found for: VD25OH, VD125OH2TOT, VD3125OH2, VD2125OH2, 25OHVITD1, 25OHVITD2, 25OHVITD3, TESTOFREE, TESTOSTERONE               Coagulation Parameters Lab Results    Component Value Date   PLT 272 05/03/2017                 Cardiovascular Markers Lab Results  Component Value Date   HGB 14.4 05/03/2017   HCT 43.3 05/03/2017                 CA Markers No results found for: CEA, CA125, LABCA2               Note: Lab results reviewed.   Meds   Current Outpatient Medications:  .  aspirin 81 MG tablet, Take 81 mg by mouth daily., Disp: , Rfl:  .  diphenhydramine-acetaminophen (TYLENOL PM EXTRA STRENGTH) 25-500 MG TABS tablet, Take 1 tablet by mouth at bedtime as needed. (Patient taking differently: Take 1 tablet by mouth at bedtime as needed (for sleep.). ), Disp: , Rfl:  .  docusate sodium (COLACE) 100 MG capsule, Take 100 mg by mouth 2 (two) times daily., Disp: , Rfl:  .  Eszopiclone 3 MG TABS, Take 3 mg by mouth at bedtime as needed. Take immediately before bedtime , Disp: , Rfl:  .  ezetimibe (ZETIA) 10 MG tablet, Take 1 tablet (10 mg total) by mouth daily., Disp: 90 tablet, Rfl: 1 .  fenofibrate 160 MG tablet, Take 1 tablet (160 mg total) by mouth daily., Disp: 90 tablet, Rfl:  1 .  finasteride (PROSCAR) 5 MG tablet, Take 1 tablet (5 mg total) by mouth daily., Disp: 90 tablet, Rfl: 3 .  furosemide (LASIX) 40 MG tablet, Take 1 tablet (40 mg total) by mouth daily. Take extra tablet as needed for weight gain >3lb/day or 5lb/wk, Disp: , Rfl:  .  HYDROcodone-acetaminophen (NORCO/VICODIN) 5-325 MG tablet, Take 1 tablet by mouth 3 (three) times daily as needed for moderate pain. For chronic pain To fill on or after: 08/07/2017, 09/06/2017, Disp: 90 tablet, Rfl: 0 .  ketoconazole (NIZORAL) 2 % cream, Apply 1 application topically daily as needed (for dermatitits). , Disp: , Rfl:  .  levothyroxine (SYNTHROID, LEVOTHROID) 200 MCG tablet, Take 1 tablet (200 mcg total) by mouth daily before breakfast., Disp: 90 tablet, Rfl: 3 .  lisinopril (PRINIVIL,ZESTRIL) 10 MG tablet, Take 1 tablet (10 mg total) by mouth daily., Disp: 90 tablet, Rfl: 3 .  loratadine (CLARITIN) 10 MG tablet, Take 1 tablet (10 mg total) by mouth daily., Disp: , Rfl:  .  metoprolol succinate (TOPROL-XL) 50 MG 24 hr tablet, Take 50 mg by mouth daily. , Disp: , Rfl:  .  Multiple Vitamin (MULTIVITAMIN) capsule, Take 1 capsule by mouth daily., Disp: , Rfl:  .  multivitamin-lutein (OCUVITE-LUTEIN) CAPS capsule, Take 1 capsule by mouth daily., Disp: , Rfl:  .  nitroGLYCERIN (NITROSTAT) 0.4 MG SL tablet, Place 0.4 mg under the tongue every 5 (five) minutes as needed for chest pain., Disp: , Rfl:  .  omega-3 acid ethyl esters (LOVAZA) 1 g capsule, Take 2 capsules (2 g total) by mouth 2 (two) times daily., Disp: 360 capsule, Rfl: 3 .  potassium chloride (K-DUR,KLOR-CON) 10 MEQ tablet, Take 1 tablet (10 mEq total) by mouth daily., Disp: , Rfl:  .  tamsulosin (FLOMAX) 0.4 MG CAPS capsule, Take 2 capsules (0.8 mg total) by mouth daily after supper., Disp: , Rfl:  .  linaclotide (LINZESS) 145 MCG CAPS capsule, Take 1 capsule (145 mcg total) by mouth daily. Take on an empty stomach at least 30 minutes before 1st meal of the day  (Patient not taking: Reported on 08/07/2017),   Disp: 90 capsule, Rfl: 1 .  tiZANidine (ZANAFLEX) 4 MG capsule, Take 1 capsule (4 mg total) by mouth 2 (two) times daily as needed for muscle spasms. (Patient not taking: Reported on 08/07/2017), Disp: 60 capsule, Rfl: 0  ROS  Constitutional: Denies any fever or chills Gastrointestinal: No reported hemesis, hematochezia, vomiting, or acute GI distress Musculoskeletal: Denies any acute onset joint swelling, redness, loss of ROM, or weakness Neurological: No reported episodes of acute onset apraxia, aphasia, dysarthria, agnosia, amnesia, paralysis, loss of coordination, or loss of consciousness  Allergies  Anthony [name] is allergic to atorvastatin; gemfibrozil; metformin and related; questran [cholestyramine]; and rosuvastatin.  PFSH  Drug: Anthony [name]  reports that he does not use drugs. Alcohol:  reports that he does not drink alcohol. Tobacco:  reports that  has never smoked. he has never used smokeless tobacco. Medical:  has a past medical history of Anosmia (1980s), Atrial flutter ([place]), CAD (coronary artery disease), Chronic constipation, Chronic insomnia, Chronic lower back pain, Diastolic CHF ([place]), History of diverticulitis, Hyperlipidemia, Hypertension, Hypothyroidism, Obesity, Class II, BMI 35-39.9, with comorbidity, and OSA on CPAP. Surgical: Anthony [name]  has a past surgical history that includes Percutaneous coronary stent intervention (pci-s); Replacement total knee bilateral (Bilateral, 2000s); Spinal cord stimulator implant (06/2016); Nasal sinus surgery; A-FLUTTER ABLATION (N/A, 05/07/2017); and Radiofrequency ablation (06/2017). Family: family history includes Kidney disease in his father; Leukemia in his brother; Stroke in his mother and sister.  Constitutional Exam  General appearance: Well nourished, well developed, and well hydrated. In no apparent acute distress Vitals:   08/07/17 1150  BP: 125/90  Pulse: 91  Resp: 18  Temp: 98.1  F (36.7 C)  TempSrc: Oral  SpO2: 96%  Weight: 285 lb (129.3 kg)  Height: 6' 1" (1.854 m)   BMI Assessment: Estimated body mass index is 37.6 kg/m as calculated from the following:   Height as of this encounter: 6' 1" (1.854 m).   Weight as of this encounter: 285 lb (129.3 kg).  BMI interpretation table: BMI level Category Range association with higher incidence of chronic pain  <18 kg/m2 Underweight   18.5-24.9 kg/m2 Ideal body weight   25-29.9 kg/m2 Overweight Increased incidence by 20%  30-34.9 kg/m2 Obese (Class I) Increased incidence by 68%  35-39.9 kg/m2 Severe obesity (Class II) Increased incidence by 136%  >40 kg/m2 Extreme obesity (Class III) Increased incidence by 254%   BMI Readings from Last 4 Encounters:  08/07/17 37.60 kg/m  07/09/17 37.07 kg/m  07/05/17 37.07 kg/m  06/27/17 36.94 kg/m   Wt Readings from Last 4 Encounters:  08/07/17 285 lb (129.3 kg)  07/09/17 281 lb (127.5 kg)  07/05/17 281 lb (127.5 kg)  06/27/17 280 lb (127 kg)  Psych/Mental status: Alert, oriented x 3 (person, place, & time)       Eyes: PERLA Respiratory: No evidence of acute respiratory distress  Cervical Spine Area Exam  Skin & Axial Inspection: No masses, redness, edema, swelling, or associated skin lesions Alignment: Symmetrical Functional ROM: Unrestricted ROM      Stability: No instability detected Muscle Tone/Strength: Functionally intact. No obvious neuro-muscular anomalies detected. Sensory (Neurological): Unimpaired Palpation: No palpable anomalies              Upper Extremity (UE) Exam    Side: Right upper extremity  Side: Left upper extremity  Skin & Extremity Inspection: Skin color, temperature, and hair growth are WNL. No peripheral edema or cyanosis. No masses, redness, swelling, asymmetry, or associated skin lesions. No contractures.  Skin & Extremity Inspection: Skin color, temperature, and hair growth are WNL. No peripheral edema or cyanosis. No masses, redness,  swelling, asymmetry, or associated skin lesions. No contractures.  Functional ROM: Unrestricted ROM          Functional ROM: Unrestricted ROM          Muscle Tone/Strength: Functionally intact. No obvious neuro-muscular anomalies detected.  Muscle Tone/Strength: Functionally intact. No obvious neuro-muscular anomalies detected.  Sensory (Neurological): Unimpaired          Sensory (Neurological): Unimpaired          Palpation: No palpable anomalies              Palpation: No palpable anomalies              Specialized Test(s): Deferred         Specialized Test(s): Deferred          Thoracic Spine Area Exam  Skin & Axial Inspection: No masses, redness, or swelling Alignment: Symmetrical Functional ROM: Unrestricted ROM Stability: No instability detected Muscle Tone/Strength: Functionally intact. No obvious neuro-muscular anomalies detected. Sensory (Neurological): Unimpaired Muscle strength & Tone: No palpable anomalies  Lumbar Spine Area Exam  Skin & Axial Inspection: No masses, redness, or swelling Alignment: Symmetrical Functional ROM: Unrestricted ROM      Stability: No instability detected Muscle Tone/Strength: Functionally intact. No obvious neuro-muscular anomalies detected. Sensory (Neurological): Unimpaired Palpation: No palpable anomalies       Provocative Tests: Lumbar Hyperextension and rotation test: Improved after treatment       Lumbar Lateral bending test: Improved after treatment       Patrick's Maneuver: evaluation deferred today                    Gait & Posture Assessment  Ambulation: Unassisted Gait: Relatively normal for age and body habitus Posture: WNL   Lower Extremity Exam    Side: Right lower extremity  Side: Left lower extremity  Skin & Extremity Inspection: Skin color, temperature, and hair growth are WNL. No peripheral edema or cyanosis. No masses, redness, swelling, asymmetry, or associated skin lesions. No contractures.  Skin & Extremity Inspection:  Skin color, temperature, and hair growth are WNL. No peripheral edema or cyanosis. No masses, redness, swelling, asymmetry, or associated skin lesions. No contractures.  Functional ROM: Unrestricted ROM          Functional ROM: Unrestricted ROM          Muscle Tone/Strength: Functionally intact. No obvious neuro-muscular anomalies detected.  Muscle Tone/Strength: Functionally intact. No obvious neuro-muscular anomalies detected.  Sensory (Neurological): Unimpaired  Sensory (Neurological): Unimpaired  Palpation: No palpable anomalies  Palpation: No palpable anomalies   Assessment  Primary Diagnosis & Pertinent Problem List: The primary encounter diagnosis was Spondylosis without myelopathy or radiculopathy, lumbar region. Diagnoses of Lumbar facet arthropathy, Chronic left-sided low back pain without sciatica, Lumbar degenerative disc disease, and Spinal cord stimulator status were also pertinent to this visit.  Status Diagnosis  Improving Stable Improving 1. Spondylosis without myelopathy or radiculopathy, lumbar region   2. Lumbar facet arthropathy   3. Chronic left-sided low back pain without sciatica   4. Lumbar degenerative disc disease   5. Spinal cord stimulator status      81-year-old male with a history of lumbar spondylosis, lumbar facet arthropathy most pronounced on the left status post left L3/4, L4/5, L5/S1 facet diagnostic nerve block on 06/13/2017, 06/27/17 and subsequent radiofrequency ablation on July 09, 2017 who presents   for follow-up.  Patient notes approximately 50% improvement in his buttock and thigh symptoms in approximately 20-30% improvement in his axial low back symptoms.  Patient states that he continues to have intermittent episodes of severe low back pain especially when he stands up.  Patient states that the intensity of his pain during these painful episodes is not is high as it was prior to the block and that the frequency of these painful episodes is less  than before as well.  Overall this would suggest a positive therapeutic intervention and we could consider repeating lumbar radiofrequency ablation in the future should his symptoms recur however no earlier than 4 months from his previous ablation in November 2018.  We also had an extensive discussion about the benefits of weight loss and participating in a formal exercise program.  I will provide the patient a referral to the [facility].  Losing weight can definitely improve his pain symptomatology.  I will also provide refills of the patient's hydrocodone for 2 months.  [place] PMP was checked and appropriate.  Furthermore, spinal cord stimulator representative was here to optimize spinal cord stimulator programming with the patient.  Plan: -Prescription for hydrocodone as below.  Prescription for 2 months -Referral to [facility] -Status post optimization of SCS programming with SCS representative -Follow-up in 2 months.    Plan of Care  Pharmacotherapy (Medications Ordered): Meds ordered this encounter  Medications  . DISCONTD: HYDROcodone-acetaminophen (NORCO/VICODIN) 5-325 MG tablet    Sig: Take 1 tablet by mouth 3 (three) times daily as needed for moderate pain. For chronic pain To fill on or after: 08/07/2017, 09/06/2017    Dispense:  90 tablet    Refill:  0  . HYDROcodone-acetaminophen (NORCO/VICODIN) 5-325 MG tablet    Sig: Take 1 tablet by mouth 3 (three) times daily as needed for moderate pain. For chronic pain To fill on or after: 08/07/2017, 09/06/2017    Dispense:  90 tablet    Refill:  0   Provider-requested follow-up: Return in about 6 weeks (around 09/18/2017) for Medication Management.  Future Appointments  Date Time Provider Department Center  09/13/2017 10:45 AM , , MD [facility] None  10/11/2017 10:30 AM Pinson, Tarlesia R, LPN [facility]  10/18/2017 11:30 AM Gutierrez, Javier, MD [facility]     Primary Care Physician: Gutierrez, Javier, MD Location: [facility] Note by:  , M.D Date: 08/07/2017; Time: 1:44 PM  Patient Instructions  1. Follow up in 6 weeks 2. Info weight loss clinic with information below: 3. Rx for Norco for 2 months  [email or web address]  [facility] [address] [place], [place] [address]  [email or web address]  Call [phone] (Press Option 2)   

## 2017-08-07 NOTE — Patient Instructions (Addendum)
1. Follow up in 6 weeks 2. Info weight loss clinic with information below: 3. Rx for Norco for 2 months  [email or web address]  [facility] [address] [place], [place] [address]  [email or web address]  Call [phone] [facility] Option 2)

## 2017-09-01 ENCOUNTER — Other Ambulatory Visit: Payer: Self-pay | Admitting: Family Medicine

## 2017-09-03 ENCOUNTER — Other Ambulatory Visit: Payer: Self-pay | Admitting: Family Medicine

## 2017-09-03 MED ORDER — FUROSEMIDE 40 MG PO TABS: 40.0000 mg | ORAL_TABLET | Freq: Every day | ORAL | 1 refills | Status: DC

## 2017-09-04 NOTE — Telephone Encounter (Signed)
Rx was filled yesterday but pt is requesting 90 day rx.

## 2017-09-13 ENCOUNTER — Other Ambulatory Visit: Payer: Self-pay

## 2017-09-13 ENCOUNTER — Encounter: Payer: Self-pay | Admitting: Student in an Organized Health Care Education/Training Program

## 2017-09-13 ENCOUNTER — Ambulatory Visit
Payer: Medicare Other | Attending: Student in an Organized Health Care Education/Training Program | Admitting: Student in an Organized Health Care Education/Training Program

## 2017-09-13 VITALS — BP 130/84 | HR 85 | Temp 97.9°F | Resp 16 | Ht 74.0 in | Wt 283.0 lb

## 2017-09-13 DIAGNOSIS — Z85828 Personal history of other malignant neoplasm of skin: Secondary | ICD-10-CM | POA: Insufficient documentation

## 2017-09-13 DIAGNOSIS — K5909 Other constipation: Secondary | ICD-10-CM | POA: Insufficient documentation

## 2017-09-13 DIAGNOSIS — Z6839 Body mass index (BMI) 39.0-39.9, adult: Secondary | ICD-10-CM | POA: Diagnosis not present

## 2017-09-13 DIAGNOSIS — G8929 Other chronic pain: Secondary | ICD-10-CM | POA: Diagnosis not present

## 2017-09-13 DIAGNOSIS — Z79899 Other long term (current) drug therapy: Secondary | ICD-10-CM | POA: Diagnosis not present

## 2017-09-13 DIAGNOSIS — I5032 Chronic diastolic (congestive) heart failure: Secondary | ICD-10-CM | POA: Insufficient documentation

## 2017-09-13 DIAGNOSIS — M545 Low back pain, unspecified: Secondary | ICD-10-CM

## 2017-09-13 DIAGNOSIS — I2510 Atherosclerotic heart disease of native coronary artery without angina pectoris: Secondary | ICD-10-CM | POA: Diagnosis not present

## 2017-09-13 DIAGNOSIS — I110 Hypertensive heart disease with heart failure: Secondary | ICD-10-CM | POA: Diagnosis not present

## 2017-09-13 DIAGNOSIS — M47816 Spondylosis without myelopathy or radiculopathy, lumbar region: Secondary | ICD-10-CM

## 2017-09-13 DIAGNOSIS — M5136 Other intervertebral disc degeneration, lumbar region: Secondary | ICD-10-CM

## 2017-09-13 DIAGNOSIS — Z7982 Long term (current) use of aspirin: Secondary | ICD-10-CM | POA: Insufficient documentation

## 2017-09-13 DIAGNOSIS — G4733 Obstructive sleep apnea (adult) (pediatric): Secondary | ICD-10-CM | POA: Diagnosis not present

## 2017-09-13 DIAGNOSIS — E669 Obesity, unspecified: Secondary | ICD-10-CM | POA: Insufficient documentation

## 2017-09-13 DIAGNOSIS — I4892 Unspecified atrial flutter: Secondary | ICD-10-CM | POA: Diagnosis not present

## 2017-09-13 DIAGNOSIS — Z955 Presence of coronary angioplasty implant and graft: Secondary | ICD-10-CM | POA: Insufficient documentation

## 2017-09-13 DIAGNOSIS — Z96653 Presence of artificial knee joint, bilateral: Secondary | ICD-10-CM | POA: Diagnosis not present

## 2017-09-13 DIAGNOSIS — F5104 Psychophysiologic insomnia: Secondary | ICD-10-CM | POA: Diagnosis not present

## 2017-09-13 DIAGNOSIS — E782 Mixed hyperlipidemia: Secondary | ICD-10-CM | POA: Diagnosis not present

## 2017-09-13 DIAGNOSIS — N400 Enlarged prostate without lower urinary tract symptoms: Secondary | ICD-10-CM | POA: Insufficient documentation

## 2017-09-13 DIAGNOSIS — E039 Hypothyroidism, unspecified: Secondary | ICD-10-CM | POA: Diagnosis not present

## 2017-09-13 NOTE — Progress Notes (Signed)
Safety precautions to be maintained throughout the outpatient stay will include: orient to surroundings, keep bed in low position, maintain call bell within reach at all times, provide assistance with transfer out of bed and ambulation.  

## 2017-09-13 NOTE — Patient Instructions (Signed)
Radiofrequency Lesioning Radiofrequency lesioning is a procedure that is performed to relieve pain. The procedure is often used for back, neck, or arm pain. Radiofrequency lesioning involves the use of a machine that creates radio waves to make heat. During the procedure, the heat is applied to the nerve that carries the pain signal. The heat damages the nerve and interferes with the pain signal. Pain relief usually starts about 2 weeks after the procedure and lasts for 6 months to 1 year. Tell a health care provider about:  Any allergies you have.  All medicines you are taking, including vitamins, herbs, eye drops, creams, and over-the-counter medicines.  Any problems you or family members have had with anesthetic medicines.  Any blood disorders you have.  Any surgeries you have had.  Any medical conditions you have.  Whether you are pregnant or may be pregnant. What are the risks? Generally, this is a safe procedure. However, problems may occur, including:  Pain or soreness at the injection site.  Infection at the injection site.  Damage to nerves or blood vessels.  What happens before the procedure?  Ask your health care provider about: ? Changing or stopping your regular medicines. This is especially important if you are taking diabetes medicines or blood thinners. ? Taking medicines such as aspirin and ibuprofen. These medicines can thin your blood. Do not take these medicines before your procedure if your health care provider instructs you not to.  Follow instructions from your health care provider about eating or drinking restrictions.  Plan to have someone take you home after the procedure.  If you go home right after the procedure, plan to have someone with you for 24 hours. What happens during the procedure?  You will be given one or more of the following: ? A medicine to help you relax (sedative). ? A medicine to numb the area (local anesthetic).  You will be  awake during the procedure. You will need to be able to talk with the health care provider during the procedure.  With the help of a type of X-ray (fluoroscopy), the health care provider will insert a radiofrequency needle into the area to be treated.  Next, a wire that carries the radio waves (electrode) will be put through the radiofrequency needle. An electrical pulse will be sent through the electrode to verify the correct nerve. You will feel a tingling sensation, and you may have muscle twitching.  Then, the tissue that is around the needle tip will be heated by an electric current that is passed using the radiofrequency machine. This will numb the nerves.  A bandage (dressing) will be put on the insertion area after the procedure is done. The procedure may vary among health care providers and hospitals. What happens after the procedure?  Your blood pressure, heart rate, breathing rate, and blood oxygen level will be monitored often until the medicines you were given have worn off.  Return to your normal activities as directed by your health care provider. This information is not intended to replace advice given to you by your health care provider. Make sure you discuss any questions you have with your health care provider. Document Released: 04/12/2011 Document Revised: 01/20/2016 Document Reviewed: 09/21/2014 [employer] Interactive Patient Education  2018 [employer]  

## 2017-09-13 NOTE — Progress Notes (Signed)
Patient's Name: Anthony [name]  MRN: [id number]  Referring Provider: Ria Bush, MD  DOB: [birth year]  PCP: Ria Bush, MD  DOS: 09/13/2017  Note by: Gillis Santa, MD  Service setting: Ambulatory outpatient  Specialty: Interventional Pain Management  Location: [facility] (AMB) Pain Management Facility    Patient type: Established   Primary Reason(s) for Visit: Evaluation of chronic illnesses with exacerbation, or progression (Level of risk: moderate) CC: Back Pain (lower)  HPI  [name] is a 82 y.o. year old, male patient, who comes today for a follow-up evaluation. He has History of basal cell carcinoma (BCC); History of squamous cell carcinoma in situ of skin; Hypercholesterolemia with hypertriglyceridemia; Chronic constipation; OSA on CPAP; BPH (benign prostatic hyperplasia); Atrial flutter ([place]); Chronic insomnia; (HFpEF) heart failure with preserved ejection fraction ([place]); Hypertension; Hypothyroidism; Obesity, Class II, BMI 35-39.9, with comorbidity; CAD (coronary artery disease); Chronic lower back pain; and Nasal congestion on their problem list. Mr. Heinz was last seen on 08/07/2017. His primarily concern today is the Back Pain (lower)  Pain Assessment: Location: Lower, Medial Back Radiating: denies Onset: More than a month ago Duration: Chronic pain Quality: Constant, Aching Severity: 3 /10 (self-reported pain score)  Note: Reported level is compatible with observation.                         When using our objective Pain Scale, levels between 6 and 10/10 are said to belong in an emergency room, as it progressively worsens from a 6/10, described as severely limiting, requiring emergency care not usually available at an outpatient pain management facility. At a 6/10 level, communication becomes difficult and requires great effort. Assistance to reach the emergency department may be required. Facial flushing and profuse sweating along with potentially dangerous increases in heart  rate and blood pressure will be evident. Effect on ADL: unable to stand for long periods of time Timing: Constant Modifying factors: procedures  Very pleasant 82 year old male who presents today endorsing return of left axial low back pain that radiates into the left buttock region.  Patient's previous left lumbar radiofrequency ablation was in November 2018.  He states that his axial low back pain is starting to return to his pre-ablation levels and is interested in repeating the ablation.  Further details on both, my assessment(s), as well as the proposed treatment plan, please see below.  Laboratory Chemistry  Inflammation Markers (CRP: Acute Phase) (ESR: Chronic Phase) No results found for: CRP, ESRSEDRATE, LATICACIDVEN               Rheumatology Markers No results found for: RF, ANA, LABURIC, URICUR, LYMEIGGIGMAB, [facility]              Renal Function Markers Lab Results  Component Value Date   BUN 21 06/26/2017   CREATININE 1.23 06/26/2017   GFRAA 54 (L) 05/03/2017   GFRNONAA 47 (L) 05/03/2017                 Hepatic Function Markers Lab Results  Component Value Date   AST 23 04/16/2017   ALT 25 04/16/2017   ALBUMIN 4.3 06/26/2017   ALKPHOS 59 04/16/2017                 Electrolytes Lab Results  Component Value Date   NA 138 06/26/2017   K 4.1 06/26/2017   CL 102 06/26/2017   CALCIUM 10.6 (H) 06/26/2017   PHOS 3.0 06/26/2017  Neuropathy Markers No results found for: VITAMINB12, FOLATE, HGBA1C, HIV               Bone Pathology Markers No results found for: VD25OH, [id number], [id number], [id number], 25OHVITD1, 25OHVITD2, 25OHVITD3, TESTOFREE, TESTOSTERONE               Coagulation Parameters Lab Results  Component Value Date   PLT 272 05/03/2017                 Cardiovascular Markers Lab Results  Component Value Date   HGB 14.4 05/03/2017   HCT 43.3 05/03/2017                 CA Markers No results found for: CEA, CA125, LABCA2                Note: Lab results reviewed.  Recent Diagnostic Imaging Review   Meds   Current Outpatient Medications:  .  aspirin 81 MG tablet, Take 81 mg by mouth daily., Disp: , Rfl:  .  diphenhydramine-acetaminophen (TYLENOL PM EXTRA STRENGTH) 25-500 MG TABS tablet, Take 1 tablet by mouth at bedtime as needed. (Patient taking differently: Take 1 tablet by mouth at bedtime as needed (for sleep.). ), Disp: , Rfl:  .  docusate sodium (COLACE) 100 MG capsule, Take 100 mg by mouth 2 (two) times daily., Disp: , Rfl:  .  Eszopiclone 3 MG TABS, Take 3 mg by mouth at bedtime as needed. Take immediately before bedtime , Disp: , Rfl:  .  ezetimibe (ZETIA) 10 MG tablet, Take 1 tablet (10 mg total) by mouth daily., Disp: 90 tablet, Rfl: 1 .  fenofibrate 160 MG tablet, Take 1 tablet (160 mg total) by mouth daily., Disp: 90 tablet, Rfl: 1 .  finasteride (PROSCAR) 5 MG tablet, Take 1 tablet (5 mg total) by mouth daily., Disp: 90 tablet, Rfl: 3 .  furosemide (LASIX) 40 MG tablet, TAKE 1 TABLET BY MOUTH EVERY DAY. TAKE AN EXTRA TABLET AS NEEDED FOR WEIGHT GAIN OF>3LB/DAY OR 5LB/WEEK, Disp: 135 tablet, Rfl: 1 .  HYDROcodone-acetaminophen (NORCO/VICODIN) 5-325 MG tablet, Take 1 tablet by mouth 3 (three) times daily as needed for moderate pain. For chronic pain To fill on or after: 08/07/2017, 09/06/2017, Disp: 90 tablet, Rfl: 0 .  ketoconazole (NIZORAL) 2 % cream, Apply 1 application topically daily as needed (for dermatitits). , Disp: , Rfl:  .  levothyroxine (SYNTHROID, LEVOTHROID) 200 MCG tablet, Take 1 tablet (200 mcg total) by mouth daily before breakfast., Disp: 90 tablet, Rfl: 3 .  linaclotide (LINZESS) 145 MCG CAPS capsule, Take 1 capsule (145 mcg total) by mouth daily. Take on an empty stomach at least 30 minutes before 1st meal of the day, Disp: 90 capsule, Rfl: 1 .  lisinopril (PRINIVIL,ZESTRIL) 10 MG tablet, Take 1 tablet (10 mg total) by mouth daily., Disp: 90 tablet, Rfl: 3 .  loratadine (CLARITIN) 10 MG  tablet, Take 1 tablet (10 mg total) by mouth daily., Disp: , Rfl:  .  metoprolol succinate (TOPROL-XL) 50 MG 24 hr tablet, Take 50 mg by mouth daily. , Disp: , Rfl:  .  Multiple Vitamin (MULTIVITAMIN) capsule, Take 1 capsule by mouth daily., Disp: , Rfl:  .  multivitamin-lutein (OCUVITE-LUTEIN) CAPS capsule, Take 1 capsule by mouth daily., Disp: , Rfl:  .  nitroGLYCERIN (NITROSTAT) 0.4 MG SL tablet, Place 0.4 mg under the tongue every 5 (five) minutes as needed for chest pain., Disp: , Rfl:  .  omega-3 acid ethyl esters (LOVAZA) 1  g capsule, Take 2 capsules (2 g total) by mouth 2 (two) times daily., Disp: 360 capsule, Rfl: 3 .  potassium chloride (K-DUR,KLOR-CON) 10 MEQ tablet, Take 1 tablet (10 mEq total) by mouth daily., Disp: , Rfl:  .  tamsulosin (FLOMAX) 0.4 MG CAPS capsule, Take 2 capsules (0.8 mg total) by mouth daily after supper., Disp: , Rfl:  .  tiZANidine (ZANAFLEX) 4 MG capsule, Take 1 capsule (4 mg total) by mouth 2 (two) times daily as needed for muscle spasms., Disp: 60 capsule, Rfl: 0  ROS  Constitutional: Denies any fever or chills Gastrointestinal: No reported hemesis, hematochezia, vomiting, or acute GI distress Musculoskeletal: Denies any acute onset joint swelling, redness, loss of ROM, or weakness Neurological: No reported episodes of acute onset apraxia, aphasia, dysarthria, agnosia, amnesia, paralysis, loss of coordination, or loss of consciousness  Allergies  Mr. No is allergic to atorvastatin; gemfibrozil; metformin and related; questran [cholestyramine]; and rosuvastatin.  PFSH  Drug: Mr. Kohles  reports that he does not use drugs. Alcohol:  reports that he does not drink alcohol. Tobacco:  reports that  has never smoked. he has never used smokeless tobacco. Medical:  has a past medical history of Anosmia (1980s), Atrial flutter ([place]), CAD (coronary artery disease), Chronic constipation, Chronic insomnia, Chronic lower back pain, Diastolic CHF ([place]), History of  diverticulitis, Hyperlipidemia, Hypertension, Hypothyroidism, Obesity, Class II, BMI 35-39.9, with comorbidity, and OSA on CPAP. Surgical: Mr. Swetz  has a past surgical history that includes Percutaneous coronary stent intervention (pci-s); Replacement total knee bilateral (Bilateral, 2000s); Spinal cord stimulator implant (06/2016); Nasal sinus surgery; A-FLUTTER ABLATION (N/A, 05/07/2017); and Radiofrequency ablation (06/2017). Family: family history includes Kidney disease in his father; Leukemia in his brother; Stroke in his mother and sister.  Constitutional Exam  General appearance: Well nourished, well developed, and well hydrated. In no apparent acute distress Vitals:   09/13/17 1051  BP: 130/84  Pulse: 85  Resp: 16  Temp: 97.9 F (36.6 C)  TempSrc: Oral  SpO2: 97%  Weight: 283 lb (128.4 kg)  Height: '6\' 2"'  (1.88 m)   BMI Assessment: Estimated body mass index is 36.34 kg/m as calculated from the following:   Height as of this encounter: '6\' 2"'  (1.88 m).   Weight as of this encounter: 283 lb (128.4 kg).  BMI interpretation table: BMI level Category Range association with higher incidence of chronic pain  <18 kg/m2 Underweight   18.5-24.9 kg/m2 Ideal body weight   25-29.9 kg/m2 Overweight Increased incidence by 20%  30-34.9 kg/m2 Obese (Class I) Increased incidence by 68%  35-39.9 kg/m2 Severe obesity (Class II) Increased incidence by 136%  >40 kg/m2 Extreme obesity (Class III) Increased incidence by 254%   BMI Readings from Last 4 Encounters:  09/13/17 36.34 kg/m  08/07/17 37.60 kg/m  07/09/17 37.07 kg/m  07/05/17 37.07 kg/m   Wt Readings from Last 4 Encounters:  09/13/17 283 lb (128.4 kg)  08/07/17 285 lb (129.3 kg)  07/09/17 281 lb (127.5 kg)  07/05/17 281 lb (127.5 kg)  Psych/Mental status: Alert, oriented x 3 (person, place, & time)       Eyes: PERLA Respiratory: No evidence of acute respiratory distress  Cervical Spine Area Exam  Skin & Axial Inspection:  No masses, redness, edema, swelling, or associated skin lesions Alignment: Symmetrical Functional ROM: Unrestricted ROM      Stability: No instability detected Muscle Tone/Strength: Functionally intact. No obvious neuro-muscular anomalies detected. Sensory (Neurological): Unimpaired Palpation: No palpable anomalies  Upper Extremity (UE) Exam    Side: Right upper extremity  Side: Left upper extremity  Skin & Extremity Inspection: Skin color, temperature, and hair growth are WNL. No peripheral edema or cyanosis. No masses, redness, swelling, asymmetry, or associated skin lesions. No contractures.  Skin & Extremity Inspection: Skin color, temperature, and hair growth are WNL. No peripheral edema or cyanosis. No masses, redness, swelling, asymmetry, or associated skin lesions. No contractures.  Functional ROM: Unrestricted ROM          Functional ROM: Unrestricted ROM          Muscle Tone/Strength: Functionally intact. No obvious neuro-muscular anomalies detected.  Muscle Tone/Strength: Functionally intact. No obvious neuro-muscular anomalies detected.  Sensory (Neurological): Unimpaired          Sensory (Neurological): Unimpaired          Palpation: No palpable anomalies              Palpation: No palpable anomalies              Specialized Test(s): Deferred         Specialized Test(s): Deferred          Thoracic Spine Area Exam  Skin & Axial Inspection: No masses, redness, or swelling Alignment: Symmetrical Functional ROM: Unrestricted ROM Stability: No instability detected Muscle Tone/Strength: Functionally intact. No obvious neuro-muscular anomalies detected. Sensory (Neurological): Unimpaired Muscle strength & Tone: No palpable anomalies  Lumbar Spine Area Exam  Skin & Axial Inspection: No masses, redness, or swelling Alignment: Symmetrical Functional ROM: Unrestricted ROM      Stability: No instability detected Muscle Tone/Strength: Functionally intact. No obvious  neuro-muscular anomalies detected. Sensory (Neurological): Articular pain pattern Palpation: Complains of area being tender to palpation Left Fist Percussion Test Provocative Tests: Lumbar Hyperextension and rotation test: Improved after treatmenton left however return axial low back left sided pain       Lumbar Lateral bending test: evaluation deferred today       Patrick's Maneuver: evaluation deferred today                    Gait & Posture Assessment  Ambulation: Unassisted Gait: Relatively normal for age and body habitus Posture: WNL   Lower Extremity Exam    Side: Right lower extremity  Side: Left lower extremity  Skin & Extremity Inspection: Skin color, temperature, and hair growth are WNL. No peripheral edema or cyanosis. No masses, redness, swelling, asymmetry, or associated skin lesions. No contractures.  Skin & Extremity Inspection: Skin color, temperature, and hair growth are WNL. No peripheral edema or cyanosis. No masses, redness, swelling, asymmetry, or associated skin lesions. No contractures.  Functional ROM: Unrestricted ROM          Functional ROM: Unrestricted ROM          Muscle Tone/Strength: Functionally intact. No obvious neuro-muscular anomalies detected.  Muscle Tone/Strength: Functionally intact. No obvious neuro-muscular anomalies detected.  Sensory (Neurological): Unimpaired  Sensory (Neurological): Unimpaired  Palpation: No palpable anomalies  Palpation: No palpable anomalies   Assessment  Primary Diagnosis & Pertinent Problem List: The primary encounter diagnosis was Spondylosis without myelopathy or radiculopathy, lumbar region. Diagnoses of Lumbar facet arthropathy, Chronic left-sided low back pain without sciatica, and Lumbar degenerative disc disease were also pertinent to this visit.  Status Diagnosis  Having a Flare-up Having a Flare-up Having a Flare-up 1. Spondylosis without myelopathy or radiculopathy, lumbar region   2. Lumbar facet arthropathy    3. Chronic left-sided low back  pain without sciatica   4. Lumbar degenerative disc disease      82 year old male with a history of lumbar spondylosis, lumbar facet arthropathy most pronounced on the left status post left L3/4, L4/5, L5/S1 facet diagnostic nerve block on 06/13/2017, 06/27/17.  He subsequently went on to have left lumbar radiofrequency ablation  in November 2018.  He states that his axial low back pain is starting to return to his pre-ablation levels and is interested in repeating the ablation.  Patient states that he initially got 60-70% improvement in his axial low back pain symptoms and range of motion symptoms but that over the last 3-4 weeks he is noticing return of axial low back pain that is in a bandlike pattern.  He does have pain with facet loading on the left.  We discussed repeating left L3-L5 radiofrequency ablation of the patient would like to proceed.  Risks and benefits were discussed.  The patient is also thinking about joining a weight loss clinic which will be residential at [facility].  I encouraged him to do this financially feasible as weight reduction will definitely assist with his chronic pain symptoms, functional status, activity level.  Plan: -Repeat left L3-L5 radiofrequency ablation with sedation -Reasonable to consider residential weight loss clinic   Provider-requested follow-up: Return in about 6 days (around 09/19/2017) for Procedure.  Future Appointments  Date Time Provider [place]  10/11/2017 10:30 AM Eustace Pen, LPN [facility]  04/29/1114 11:30 AM Ria Bush, MD [facility]   Time Note: Greater than 50% of the 25 minute(s) of face-to-face time spent with Mr. Ostermiller, was spent in counseling/coordination of care regarding: Mr. Gomillion primary cause of pain, the treatment plan, treatment alternatives, the risks and possible complications of proposed treatment, the results, interpretation and significance of  his recent diagnostic  interventional treatment(s), realistic expectations and the need to bring and keep the BMI below 30. Primary Care Physician: Ria Bush, MD Location: [facility] Note by: Gillis Santa, M.D Date: 09/13/2017; Time: 11:26 AM  There are no Patient Instructions on file for this visit.

## 2017-09-13 NOTE — Progress Notes (Deleted)
Patient's Name: Anthony [name]  MRN: [id number]  Referring Provider: Ria Bush, MD  DOB: [birth year]  PCP: Ria Bush, MD  DOS: 09/13/2017  Note by: Gillis Santa, MD  Service setting: Ambulatory outpatient  Specialty: Interventional Pain Management  Location: [facility] (AMB) Pain Management Facility    Patient type: Established   Primary Reason(s) for Visit: Encounter for prescription drug management. (Level of risk: moderate)  CC: Back Pain (lower)  HPI  Anthony [name] is a 82 y.o. year old, male patient, who comes today for a medication management evaluation. He has History of basal cell carcinoma (BCC); History of squamous cell carcinoma in situ of skin; Hypercholesterolemia with hypertriglyceridemia; Chronic constipation; OSA on CPAP; BPH (benign prostatic hyperplasia); Atrial flutter ([place]); Chronic insomnia; (HFpEF) heart failure with preserved ejection fraction ([place]); Hypertension; Hypothyroidism; Obesity, Class II, BMI 35-39.9, with comorbidity; CAD (coronary artery disease); Chronic lower back pain; and Nasal congestion on their problem list. His primarily concern today is the Back Pain (lower)  Pain Assessment: Location: Lower, Medial Back Radiating: denies Onset: More than a month ago Duration: Chronic pain Quality: Constant, Aching Severity: 3 /10 (self-reported pain score)  Note: Reported level is compatible with observation.                         When using our objective Pain Scale, levels between 6 and 10/10 are said to belong in an emergency room, as it progressively worsens from a 6/10, described as severely limiting, requiring emergency care not usually available at an outpatient pain management facility. At a 6/10 level, communication becomes difficult and requires great effort. Assistance to reach the emergency department may be required. Facial flushing and profuse sweating along with potentially dangerous increases in heart rate and blood pressure will be evident. Effect  on ADL: unable to stand for long periods of time Timing: Constant Modifying factors: procedures  Anthony [name] was last scheduled for an appointment on 08/07/2017 for medication management. During today's appointment we reviewed Anthony [name] chronic pain status, as well as his outpatient medication regimen.  The patient  reports that he does not use drugs. His body mass index is 36.34 kg/m.  Further details on both, my assessment(s), as well as the proposed treatment plan, please see below.  Controlled Substance Pharmacotherapy Assessment REMS (Risk Evaluation and Mitigation Strategy)  Analgesic: *** MME/day: *** mg/day.  Rise Patience  09/13/2017 11:08 AM  Sign at close encounter Safety precautions to be maintained throughout the outpatient stay will include: orient to surroundings, keep bed in low position, maintain call bell within reach at all times, provide assistance with transfer out of bed and ambulation.    Pharmacokinetics: Liberation and absorption (onset of action): WNL Distribution (time to peak effect): WNL Metabolism and excretion (duration of action): WNL         Pharmacodynamics: Desired effects: Analgesia: Anthony [name] reports >50% benefit. Functional ability: Patient reports that medication allows him to accomplish basic ADLs Clinically meaningful improvement in function (CMIF): Sustained CMIF goals met Perceived effectiveness: Described as relatively effective, allowing for increase in activities of daily living (ADL) Undesirable effects: Side-effects or Adverse reactions: None reported Monitoring: [place] PMP: Online review of the past 45-monthperiod conducted. Compliant with practice rules and regulations Last UDS on record: Summary  Date Value Ref Range Status  04/24/2017 FINAL  Final    Comment:    ==================================================================== TOXASSURE COMP DRUG  ANALYSIS,UR ==================================================================== Test  Result       Flag       Units Drug Present and Declared for Prescription Verification   Hydrocodone                    173          EXPECTED   ng/mg creat   Norhydrocodone                 195          EXPECTED   ng/mg creat    Sources of hydrocodone include scheduled prescription    medications. Norhydrocodone is an expected metabolite of    hydrocodone.   Acetaminophen                  PRESENT      EXPECTED   Diphenhydramine                PRESENT      EXPECTED   Metoprolol                     PRESENT      EXPECTED Drug Absent but Declared for Prescription Verification   Tizanidine                     Not Detected UNEXPECTED    Tizanidine, as indicated in the declared medication list, is not    always detected even when used as directed.   Zopiclone/Eszopiclone          Not Detected UNEXPECTED    Eszopiclone, as indicated in the declared medication list, is not    always detected even when used as directed.   Zaleplon                       Not Detected UNEXPECTED    Zaleplon, as indicated in the declared medication list, is not    always detected even when used as directed.   Salicylate                     Not Detected UNEXPECTED    Aspirin, as indicated in the declared medication list, is not    always detected even when used as directed. ==================================================================== Test                      Result    Flag   Units      Ref Range   Creatinine              78               mg/dL      >=20 ==================================================================== Declared Medications:  The flagging and interpretation on this report are based on the  following declared medications.  Unexpected results may arise from  inaccuracies in the declared medications.  **Note: The testing scope of this panel includes these medications:   Diphenhydramine (Tylenol PM)  Hydrocodone (Hydrocodone-Acetaminophen)  Metoprolol  **Note: The testing scope of this panel does not include small to  moderate amounts of these reported medications:  Acetaminophen (Hydrocodone-Acetaminophen)  Acetaminophen (Tylenol PM)  Aspirin  Eszopiclone  Tizanidine  Zaleplon ([place])  **Note: The testing scope of this panel does not include following  reported medications:  Apixaban (Eliquis)  Docusate (Colace)  Ezetimibe (Zetia)  Fenofibrate  Finasteride (Proscar)  Furosemide (Lasix)  Ketoconazole  Levothyroxine  Linaclotide (Linzess)  Lisinopril  Montelukast (Singulair)  Multivitamin  Nitroglycerin  Omega-3 Fatty Acids (Lovaza)  Polyethylene Glycol  Potassium  Tamsulosin (Flomax) ==================================================================== For clinical consultation, please call [phone]. ====================================================================    UDS interpretation: Compliant          Medication Assessment Form: Reviewed. Patient indicates being compliant with therapy Treatment compliance: Compliant Risk Assessment Profile: Aberrant behavior: See prior evaluations. None observed or detected today Comorbid factors increasing risk of overdose: See prior notes. No additional risks detected today Risk of substance use disorder (SUD): Low Opioid Risk Tool - 09/13/17 1103      Family History of Substance Abuse   Alcohol  Negative    Illegal Drugs  Negative    Rx Drugs  Negative      Personal History of Substance Abuse   Alcohol  Negative    Illegal Drugs  Negative    Rx Drugs  Negative      Age   Age between 23-45 years   No      History of Preadolescent Sexual Abuse   History of Preadolescent Sexual Abuse  Negative or Male      Psychological Disease   Psychological Disease  Negative    Depression  Negative      Total Score   Opioid Risk Tool Scoring  0    Opioid Risk Interpretation  Low Risk       ORT Scoring interpretation table:  Score <3 = Low Risk for SUD  Score between 4-7 = Moderate Risk for SUD  Score >8 = High Risk for Opioid Abuse   Risk Mitigation Strategies:  Patient Counseling: Covered Patient-Prescriber Agreement (PPA): Present and active  Notification to other healthcare providers: Done  Pharmacologic Plan: No change in therapy, at this time.             Laboratory Chemistry  Inflammation Markers (CRP: Acute Phase) (ESR: Chronic Phase) No results found for: CRP, ESRSEDRATE, LATICACIDVEN               Rheumatology Markers No results found for: RF, ANA, LABURIC, URICUR, LYMEIGGIGMAB, [facility]              Renal Function Markers Lab Results  Component Value Date   BUN 21 06/26/2017   CREATININE 1.23 06/26/2017   GFRAA 54 (L) 05/03/2017   GFRNONAA 47 (L) 05/03/2017                 Hepatic Function Markers Lab Results  Component Value Date   AST 23 04/16/2017   ALT 25 04/16/2017   ALBUMIN 4.3 06/26/2017   ALKPHOS 59 04/16/2017                 Electrolytes Lab Results  Component Value Date   NA 138 06/26/2017   K 4.1 06/26/2017   CL 102 06/26/2017   CALCIUM 10.6 (H) 06/26/2017   PHOS 3.0 06/26/2017                 Neuropathy Markers No results found for: VITAMINB12, FOLATE, HGBA1C, HIV               Bone Pathology Markers No results found for: VD25OH, VD125OH2TOT, [id number], GQ6761PJ0, 25OHVITD1, 25OHVITD2, 25OHVITD3, TESTOFREE, TESTOSTERONE               Coagulation Parameters Lab Results  Component Value Date   PLT 272 05/03/2017                 Cardiovascular Markers Lab Results  Component Value Date  HGB 14.4 05/03/2017   HCT 43.3 05/03/2017                 CA Markers No results found for: CEA, CA125, LABCA2               Note: Lab results reviewed.  Recent Diagnostic Imaging Results  DG C-Arm 1-60 Min-No Report Fluoroscopy was utilized by the requesting physician.  No radiographic  interpretation.   Complexity  Note: Imaging results reviewed. Results shared with Anthony [name], using Layman's terms.                         Meds   Current Outpatient Medications:  .  aspirin 81 MG tablet, Take 81 mg by mouth daily., Disp: , Rfl:  .  diphenhydramine-acetaminophen (TYLENOL PM EXTRA STRENGTH) 25-500 MG TABS tablet, Take 1 tablet by mouth at bedtime as needed. (Patient taking differently: Take 1 tablet by mouth at bedtime as needed (for sleep.). ), Disp: , Rfl:  .  docusate sodium (COLACE) 100 MG capsule, Take 100 mg by mouth 2 (two) times daily., Disp: , Rfl:  .  Eszopiclone 3 MG TABS, Take 3 mg by mouth at bedtime as needed. Take immediately before bedtime , Disp: , Rfl:  .  ezetimibe (ZETIA) 10 MG tablet, Take 1 tablet (10 mg total) by mouth daily., Disp: 90 tablet, Rfl: 1 .  fenofibrate 160 MG tablet, Take 1 tablet (160 mg total) by mouth daily., Disp: 90 tablet, Rfl: 1 .  finasteride (PROSCAR) 5 MG tablet, Take 1 tablet (5 mg total) by mouth daily., Disp: 90 tablet, Rfl: 3 .  furosemide (LASIX) 40 MG tablet, TAKE 1 TABLET BY MOUTH EVERY DAY. TAKE AN EXTRA TABLET AS NEEDED FOR WEIGHT GAIN OF>3LB/DAY OR 5LB/WEEK, Disp: 135 tablet, Rfl: 1 .  HYDROcodone-acetaminophen (NORCO/VICODIN) 5-325 MG tablet, Take 1 tablet by mouth 3 (three) times daily as needed for moderate pain. For chronic pain To fill on or after: 08/07/2017, 09/06/2017, Disp: 90 tablet, Rfl: 0 .  ketoconazole (NIZORAL) 2 % cream, Apply 1 application topically daily as needed (for dermatitits). , Disp: , Rfl:  .  levothyroxine (SYNTHROID, LEVOTHROID) 200 MCG tablet, Take 1 tablet (200 mcg total) by mouth daily before breakfast., Disp: 90 tablet, Rfl: 3 .  linaclotide (LINZESS) 145 MCG CAPS capsule, Take 1 capsule (145 mcg total) by mouth daily. Take on an empty stomach at least 30 minutes before 1st meal of the day, Disp: 90 capsule, Rfl: 1 .  lisinopril (PRINIVIL,ZESTRIL) 10 MG tablet, Take 1 tablet (10 mg total) by mouth daily., Disp: 90 tablet, Rfl:  3 .  loratadine (CLARITIN) 10 MG tablet, Take 1 tablet (10 mg total) by mouth daily., Disp: , Rfl:  .  metoprolol succinate (TOPROL-XL) 50 MG 24 hr tablet, Take 50 mg by mouth daily. , Disp: , Rfl:  .  Multiple Vitamin (MULTIVITAMIN) capsule, Take 1 capsule by mouth daily., Disp: , Rfl:  .  multivitamin-lutein (OCUVITE-LUTEIN) CAPS capsule, Take 1 capsule by mouth daily., Disp: , Rfl:  .  nitroGLYCERIN (NITROSTAT) 0.4 MG SL tablet, Place 0.4 mg under the tongue every 5 (five) minutes as needed for chest pain., Disp: , Rfl:  .  omega-3 acid ethyl esters (LOVAZA) 1 g capsule, Take 2 capsules (2 g total) by mouth 2 (two) times daily., Disp: 360 capsule, Rfl: 3 .  potassium chloride (K-DUR,KLOR-CON) 10 MEQ tablet, Take 1 tablet (10 mEq total) by mouth daily., Disp: ,  Rfl:  .  tamsulosin (FLOMAX) 0.4 MG CAPS capsule, Take 2 capsules (0.8 mg total) by mouth daily after supper., Disp: , Rfl:  .  tiZANidine (ZANAFLEX) 4 MG capsule, Take 1 capsule (4 mg total) by mouth 2 (two) times daily as needed for muscle spasms., Disp: 60 capsule, Rfl: 0  ROS  Constitutional: Denies any fever or chills Gastrointestinal: No reported hemesis, hematochezia, vomiting, or acute GI distress Musculoskeletal: Denies any acute onset joint swelling, redness, loss of ROM, or weakness Neurological: No reported episodes of acute onset apraxia, aphasia, dysarthria, agnosia, amnesia, paralysis, loss of coordination, or loss of consciousness  Allergies  Anthony [name] is allergic to atorvastatin; gemfibrozil; metformin and related; questran [cholestyramine]; and rosuvastatin.  PFSH  Drug: Anthony [name]  reports that he does not use drugs. Alcohol:  reports that he does not drink alcohol. Tobacco:  reports that  has never smoked. he has never used smokeless tobacco. Medical:  has a past medical history of Anosmia (1980s), Atrial flutter ([place]), CAD (coronary artery disease), Chronic constipation, Chronic insomnia, Chronic lower back  pain, Diastolic CHF ([place]), History of diverticulitis, Hyperlipidemia, Hypertension, Hypothyroidism, Obesity, Class II, BMI 35-39.9, with comorbidity, and OSA on CPAP. Surgical: Anthony [name]  has a past surgical history that includes Percutaneous coronary stent intervention (pci-s); Replacement total knee bilateral (Bilateral, 2000s); Spinal cord stimulator implant (06/2016); Nasal sinus surgery; A-FLUTTER ABLATION (N/A, 05/07/2017); and Radiofrequency ablation (06/2017). Family: family history includes Kidney disease in his father; Leukemia in his brother; Stroke in his mother and sister.  Constitutional Exam  General appearance: Well nourished, well developed, and well hydrated. In no apparent acute distress Vitals:   09/13/17 1051  BP: 130/84  Pulse: 85  Resp: 16  Temp: 97.9 F (36.6 C)  TempSrc: Oral  SpO2: 97%  Weight: 283 lb (128.4 kg)  Height: 6' 2" (1.88 m)   BMI Assessment: Estimated body mass index is 36.34 kg/m as calculated from the following:   Height as of this encounter: 6' 2" (1.88 m).   Weight as of this encounter: 283 lb (128.4 kg).  BMI interpretation table: BMI level Category Range association with higher incidence of chronic pain  <18 kg/m2 Underweight   18.5-24.9 kg/m2 Ideal body weight   25-29.9 kg/m2 Overweight Increased incidence by 20%  30-34.9 kg/m2 Obese (Class I) Increased incidence by 68%  35-39.9 kg/m2 Severe obesity (Class II) Increased incidence by 136%  >40 kg/m2 Extreme obesity (Class III) Increased incidence by 254%   BMI Readings from Last 4 Encounters:  09/13/17 36.34 kg/m  08/07/17 37.60 kg/m  07/09/17 37.07 kg/m  07/05/17 37.07 kg/m   Wt Readings from Last 4 Encounters:  09/13/17 283 lb (128.4 kg)  08/07/17 285 lb (129.3 kg)  07/09/17 281 lb (127.5 kg)  07/05/17 281 lb (127.5 kg)  Psych/Mental status: Alert, oriented x 3 (person, place, & time)       Eyes: PERLA Respiratory: No evidence of acute respiratory distress  Cervical  Spine Area Exam  Skin & Axial Inspection: No masses, redness, edema, swelling, or associated skin lesions Alignment: Symmetrical Functional ROM: Unrestricted ROM      Stability: No instability detected Muscle Tone/Strength: Functionally intact. No obvious neuro-muscular anomalies detected. Sensory (Neurological): Unimpaired Palpation: No palpable anomalies              Upper Extremity (UE) Exam    Side: Right upper extremity  Side: Left upper extremity  Skin & Extremity Inspection: Skin color, temperature, and hair growth are WNL. No  peripheral edema or cyanosis. No masses, redness, swelling, asymmetry, or associated skin lesions. No contractures.  Skin & Extremity Inspection: Skin color, temperature, and hair growth are WNL. No peripheral edema or cyanosis. No masses, redness, swelling, asymmetry, or associated skin lesions. No contractures.  Functional ROM: Unrestricted ROM          Functional ROM: Unrestricted ROM          Muscle Tone/Strength: Functionally intact. No obvious neuro-muscular anomalies detected.  Muscle Tone/Strength: Functionally intact. No obvious neuro-muscular anomalies detected.  Sensory (Neurological): Unimpaired          Sensory (Neurological): Unimpaired          Palpation: No palpable anomalies              Palpation: No palpable anomalies              Specialized Test(s): Deferred         Specialized Test(s): Deferred          Thoracic Spine Area Exam  Skin & Axial Inspection: No masses, redness, or swelling Alignment: Symmetrical Functional ROM: Unrestricted ROM Stability: No instability detected Muscle Tone/Strength: Functionally intact. No obvious neuro-muscular anomalies detected. Sensory (Neurological): Unimpaired Muscle strength & Tone: No palpable anomalies  Lumbar Spine Area Exam  Skin & Axial Inspection: No masses, redness, or swelling Alignment: Symmetrical Functional ROM: Unrestricted ROM      Stability: No instability detected Muscle  Tone/Strength: Functionally intact. No obvious neuro-muscular anomalies detected. Sensory (Neurological): Unimpaired Palpation: No palpable anomalies       Provocative Tests: Lumbar Hyperextension and rotation test: evaluation deferred today       Lumbar Lateral bending test: evaluation deferred today       Patrick's Maneuver: evaluation deferred today                    Gait & Posture Assessment  Ambulation: Unassisted Gait: Relatively normal for age and body habitus Posture: WNL   Lower Extremity Exam    Side: Right lower extremity  Side: Left lower extremity  Skin & Extremity Inspection: Skin color, temperature, and hair growth are WNL. No peripheral edema or cyanosis. No masses, redness, swelling, asymmetry, or associated skin lesions. No contractures.  Skin & Extremity Inspection: Skin color, temperature, and hair growth are WNL. No peripheral edema or cyanosis. No masses, redness, swelling, asymmetry, or associated skin lesions. No contractures.  Functional ROM: Unrestricted ROM          Functional ROM: Unrestricted ROM          Muscle Tone/Strength: Functionally intact. No obvious neuro-muscular anomalies detected.  Muscle Tone/Strength: Functionally intact. No obvious neuro-muscular anomalies detected.  Sensory (Neurological): Unimpaired  Sensory (Neurological): Unimpaired  Palpation: No palpable anomalies  Palpation: No palpable anomalies   Assessment  Primary Diagnosis & Pertinent Problem List: There were no encounter diagnoses.  Status Diagnosis  Controlled Controlled Controlled No diagnosis found.  Problems updated and reviewed during this visit: No problems updated. Plan of Care  Pharmacotherapy (Medications Ordered): No orders of the defined types were placed in this encounter.  Lab-work, procedure(s), and/or referral(s): No orders of the defined types were placed in this encounter.   Pharmacological management options:  Opioid Analgesics: We'll take over  management today. See above orders Membrane stabilizer: We have discussed the possibility of optimizing this mode of therapy, if tolerated Muscle relaxant: We have discussed the possibility of a trial NSAID: We have discussed the possibility of a trial Other analgesic(s):  To be determined at a later time   Interventional management options: Planned, scheduled, and/or pending:    ***   Considering:   ***   PRN Procedures:   To be determined at a later time   Provider-requested follow-up: No Follow-up on file.  Future Appointments  Date Time Provider [place]  10/11/2017 10:30 AM Eustace Pen, LPN [facility]  4/81/8563 11:30 AM Ria Bush, MD [facility]    Primary Care Physician: Ria Bush, MD Location: [facility] Note by: Gillis Santa, M.D Date: 09/13/2017; Time: 11:12 AM  There are no Patient Instructions on file for this visit.

## 2017-09-19 ENCOUNTER — Ambulatory Visit (HOSPITAL_BASED_OUTPATIENT_CLINIC_OR_DEPARTMENT_OTHER): Payer: Medicare Other | Admitting: Student in an Organized Health Care Education/Training Program

## 2017-09-19 ENCOUNTER — Ambulatory Visit
Admission: RE | Admit: 2017-09-19 | Discharge: 2017-09-19 | Disposition: A | Discharge status: Home / Self Care | Payer: Medicare Other | Source: Ambulatory Visit | Attending: Student in an Organized Health Care Education/Training Program | Admitting: Student in an Organized Health Care Education/Training Program

## 2017-09-19 ENCOUNTER — Encounter: Payer: Self-pay | Admitting: Student in an Organized Health Care Education/Training Program

## 2017-09-19 VITALS — BP 136/74 | HR 85 | Temp 97.1°F | Resp 18 | Ht 74.0 in | Wt 282.0 lb

## 2017-09-19 DIAGNOSIS — Z79890 Hormone replacement therapy (postmenopausal): Secondary | ICD-10-CM | POA: Diagnosis not present

## 2017-09-19 DIAGNOSIS — M5136 Other intervertebral disc degeneration, lumbar region: Secondary | ICD-10-CM | POA: Diagnosis not present

## 2017-09-19 DIAGNOSIS — G8929 Other chronic pain: Secondary | ICD-10-CM | POA: Insufficient documentation

## 2017-09-19 DIAGNOSIS — M545 Low back pain: Secondary | ICD-10-CM | POA: Insufficient documentation

## 2017-09-19 DIAGNOSIS — Z7982 Long term (current) use of aspirin: Secondary | ICD-10-CM | POA: Diagnosis not present

## 2017-09-19 DIAGNOSIS — M47816 Spondylosis without myelopathy or radiculopathy, lumbar region: Secondary | ICD-10-CM

## 2017-09-19 DIAGNOSIS — Z79891 Long term (current) use of opiate analgesic: Secondary | ICD-10-CM | POA: Insufficient documentation

## 2017-09-19 DIAGNOSIS — Z96653 Presence of artificial knee joint, bilateral: Secondary | ICD-10-CM | POA: Diagnosis not present

## 2017-09-19 DIAGNOSIS — F419 Anxiety disorder, unspecified: Secondary | ICD-10-CM | POA: Insufficient documentation

## 2017-09-19 DIAGNOSIS — Z955 Presence of coronary angioplasty implant and graft: Secondary | ICD-10-CM | POA: Diagnosis not present

## 2017-09-19 DIAGNOSIS — M1288 Other specific arthropathies, not elsewhere classified, other specified site: Secondary | ICD-10-CM | POA: Insufficient documentation

## 2017-09-19 DIAGNOSIS — Z888 Allergy status to other drugs, medicaments and biological substances status: Secondary | ICD-10-CM | POA: Diagnosis not present

## 2017-09-19 DIAGNOSIS — Z79899 Other long term (current) drug therapy: Secondary | ICD-10-CM | POA: Diagnosis not present

## 2017-09-19 MED ORDER — DEXAMETHASONE SODIUM PHOSPHATE 10 MG/ML IJ SOLN
10.0000 mg | Freq: Once | INTRAMUSCULAR | Status: AC
Start: 2017-09-19 — End: 2017-09-19
  Administered 2017-09-19: 10 mg
  Filled 2017-09-19: qty 1

## 2017-09-19 MED ORDER — EPHEDRINE SULFATE 50 MG/ML IJ SOLN
INTRAMUSCULAR | Status: AC
  Filled 2017-09-19: qty 1

## 2017-09-19 MED ORDER — LACTATED RINGERS IV SOLN
1000.0000 mL | Freq: Once | INTRAVENOUS | Status: AC
  Administered 2017-09-19: 1000 mL via INTRAVENOUS

## 2017-09-19 MED ORDER — EPHEDRINE SULFATE 50 MG/ML IJ SOLN
5.0000 mg | Freq: Once | INTRAMUSCULAR | Status: AC
  Administered 2017-09-19: 50 mg via INTRAVENOUS

## 2017-09-19 MED ORDER — ROPIVACAINE HCL 2 MG/ML IJ SOLN
10.0000 mL | Freq: Once | INTRAMUSCULAR | Status: AC
  Administered 2017-09-19: 10 mL
  Filled 2017-09-19: qty 10

## 2017-09-19 MED ORDER — LIDOCAINE HCL (PF) 1 % IJ SOLN
10.0000 mL | Freq: Once | INTRAMUSCULAR | Status: AC
  Administered 2017-09-19: 5 mL
  Filled 2017-09-19: qty 10

## 2017-09-19 MED ORDER — FENTANYL CITRATE (PF) 100 MCG/2ML IJ SOLN
25.0000 ug | INTRAMUSCULAR | Status: DC | PRN
  Administered 2017-09-19: 25 ug via INTRAVENOUS
  Filled 2017-09-19: qty 2

## 2017-09-19 NOTE — Progress Notes (Signed)
Patient's Name: Anthony [name]  MRN: [id number]  Referring Provider: Ria Bush, MD  DOB: [birth year]  PCP: Ria Bush, MD  DOS: 09/19/2017  Note by: Gillis Santa, MD  Service setting: Ambulatory outpatient  Specialty: Interventional Pain Management  Patient type: Established  Location: [facility] (AMB) Pain Management Facility  Visit type: Interventional Procedure   Primary Reason for Visit: Interventional Pain Management Treatment. CC: Back Pain (lower left )  Procedure:  Anesthesia, Analgesia, Anxiolysis:  Type: Therapeutic Medial Branch Facet Radiofrequency Ablation Region: Lumbar Level:  L3, L4, L5, Medial Branch Level(s) Laterality: Left-Sided  Type: Local Anesthesia with Moderate (Conscious) Sedation Local Anesthetic: Lidocaine 1% Route: Intravenous (IV) IV Access: Secured Sedation: Meaningful verbal contact was maintained at all times during the procedure  Indication(s): Analgesia and Anxiety   Indications: 1. Spondylosis without myelopathy or radiculopathy, lumbar region   2. Lumbar facet arthropathy   3. Chronic left-sided low back pain without sciatica   4. Lumbar degenerative disc disease    Mr. Chandran has either failed to respond, was unable to tolerate, or simply did not get enough benefit from other more conservative therapies including, but not limited to: 1. Over-the-counter medications 2. Anti-inflammatory medications 3. Muscle relaxants 4. Membrane stabilizers 5. Opioids 6. Physical therapy 7. Modalities (Heat, ice, etc.) 8. Invasive techniques such as nerve blocks. Mr. Dentremont has attained more than 50% relief of the pain from a series of diagnostic injections conducted in separate occasions.  Pain Score: Pre-procedure: 2 /10 Post-procedure: 0-No pain/10    Pre-op Assessment:  Mr. Ziegler is a 82 y.o. (year old), male patient, seen today for interventional treatment. He  has a past surgical history that includes Percutaneous coronary stent  intervention (pci-s); Replacement total knee bilateral (Bilateral, 2000s); Spinal cord stimulator implant (06/2016); Nasal sinus surgery; A-FLUTTER ABLATION (N/A, 05/07/2017); and Radiofrequency ablation (06/2017). Mr. Wilbon has a current medication list which includes the following prescription(s): aspirin, diphenhydramine-acetaminophen, docusate sodium, eszopiclone, ezetimibe, fenofibrate, finasteride, furosemide, hydrocodone-acetaminophen, ketoconazole, levothyroxine, linaclotide, lisinopril, loratadine, metoprolol succinate, multivitamin, multivitamin-lutein, nitroglycerin, omega-3 acid ethyl esters, potassium chloride, tamsulosin, and tizanidine, and the following Facility-Administered Medications: fentanyl. His primarily concern today is the Back Pain (lower left )  Initial Vital Signs: There were no vitals taken for this visit. BMI: Estimated body mass index is 36.21 kg/m as calculated from the following:   Height as of this encounter: 6\' 2"  (1.88 m).   Weight as of this encounter: 282 lb (127.9 kg).  Risk Assessment: Allergies: Reviewed. He is allergic to atorvastatin; gemfibrozil; metformin and related; questran [cholestyramine]; and rosuvastatin.  Allergy Precautions: None required Coagulopathies: Reviewed. None identified.  Blood-thinner therapy: None at this time Active Infection(s): Reviewed. None identified. Mr. Andreoli is afebrile  Site Confirmation: Mr. Ambrocio was asked to confirm the procedure and laterality before marking the site Procedure checklist: Completed Consent: Before the procedure and under the influence of no sedative(s), amnesic(s), or anxiolytics, the patient was informed of the treatment options, risks and possible complications. To fulfill our ethical and legal obligations, as recommended by the American Medical Association's Code of Ethics, I have informed the patient of my clinical impression; the nature and purpose of the treatment or procedure; the risks,  benefits, and possible complications of the intervention; the alternatives, including doing nothing; the risk(s) and benefit(s) of the alternative treatment(s) or procedure(s); and the risk(s) and benefit(s) of doing nothing. The patient was provided information about the general risks and possible complications associated with the procedure. These may include, but are not  limited to: failure to achieve desired goals, infection, bleeding, organ or nerve damage, allergic reactions, paralysis, and death. In addition, the patient was informed of those risks and complications associated to Spine-related procedures, such as failure to decrease pain; infection (i.e.: Meningitis, epidural or intraspinal abscess); bleeding (i.e.: epidural hematoma, subarachnoid hemorrhage, or any other type of intraspinal or peri-dural bleeding); organ or nerve damage (i.e.: Any type of peripheral nerve, nerve root, or spinal cord injury) with subsequent damage to sensory, motor, and/or autonomic systems, resulting in permanent pain, numbness, and/or weakness of one or several areas of the body; allergic reactions; (i.e.: anaphylactic reaction); and/or death. Furthermore, the patient was informed of those risks and complications associated with the medications. These include, but are not limited to: allergic reactions (i.e.: anaphylactic or anaphylactoid reaction(s)); adrenal axis suppression; blood sugar elevation that in diabetics may result in ketoacidosis or comma; water retention that in patients with history of congestive heart failure may result in shortness of breath, pulmonary edema, and decompensation with resultant heart failure; weight gain; swelling or edema; medication-induced neural toxicity; particulate matter embolism and blood vessel occlusion with resultant organ, and/or nervous system infarction; and/or aseptic necrosis of one or more joints. Finally, the patient was informed that Medicine is not an exact science;  therefore, there is also the possibility of unforeseen or unpredictable risks and/or possible complications that may result in a catastrophic outcome. The patient indicated having understood very clearly. We have given the patient no guarantees and we have made no promises. Enough time was given to the patient to ask questions, all of which were answered to the patient's satisfaction. Mr. Lahaie has indicated that he wanted to continue with the procedure. Attestation: I, the ordering provider, attest that I have discussed with the patient the benefits, risks, side-effects, alternatives, likelihood of achieving goals, and potential problems during recovery for the procedure that I have provided informed consent. Date: 09/19/2017; Time: 8:02 AM  Pre-Procedure Preparation:  Monitoring: As per clinic protocol. Respiration, ETCO2, SpO2, BP, heart rate and rhythm monitor placed and checked for adequate function Safety Precautions: Patient was assessed for positional comfort and pressure points before starting the procedure. Time-out: I initiated and conducted the "Time-out" before starting the procedure, as per protocol. The patient was asked to participate by confirming the accuracy of the "Time Out" information. Verification of the correct person, site, and procedure were performed and confirmed by me, the nursing staff, and the patient. "Time-out" conducted as per Joint Commission's Universal Protocol (UP.01.01.01). "Time-out" Date & Time: 09/19/2017; 0833 hrs.  Description of Procedure Process:   Position: Prone Target Area: For Lumbar Facet blocks, the target is the groove formed by the junction of the transverse process and superior articular process. For the L5 dorsal ramus, the target is the notch between superior articular process and sacral ala.  Approach: Paraspinal approach. Area Prepped: Entire Posterior Lumbosacral Region Prepping solution: Hibiclens (4.0% Chlorhexidine gluconate  solution) Safety Precautions: Aspiration looking for blood return was conducted prior to all injections. At no point did we inject any substances, as a needle was being advanced. No attempts were made at seeking any paresthesias. Safe injection practices and needle disposal techniques used. Medications properly checked for expiration dates. SDV (single dose vial) medications used. Description of the Procedure: Protocol guidelines were followed. The patient was placed in position over the procedure table. The target area was identified and the area prepped in the usual manner. The skin and muscle were infiltrated with local anesthetic. Appropriate amount  of time allowed to pass for local anesthetics to take effect. Radiofrequency needles were introduced to the target area using fluoroscopic guidance. Using the NeuroTherm NT1100 Radiofrequency Generator, sensory stimulation using 50 Hz was used to locate & identify the nerve, making sure that the needle was positioned such that there was no sensory stimulation below 0.3 V or above 0.7 V. Stimulation using 2 Hz was used to evaluate the motor component. Care was taken not to lesion any nerves that demonstrated motor stimulation of the lower extremities at an output of less than 2.5 times that of the sensory threshold, or a maximum of 2.0 V. Once satisfactory placement of the needles was achieved, the numbing solution was slowly injected after negative aspiration. After waiting for at least 2 minutes, the ablation was performed at 80 degrees C for 60 seconds, using regular Radiofrequency settings. Once the procedure was completed, the needles were then removed and the area cleansed, making sure to leave some of the prepping solution back to take advantage of its long term bactericidal properties. Intra-operative Compliance: Compliant  Illustration of the posterior view of the lumbar spine and the posterior neural structures. Laminae of L2 through S1 are labeled.  DPRL5, dorsal primary ramus of L5; DPRS1, dorsal primary ramus of S1; DPR3, dorsal primary ramus of L3; FJ, facet (zygapophyseal) joint L3-L4; I, inferior articular process of L4; LB1, lateral branch of dorsal primary ramus of L1; IAB, inferior articular branches from L3 medial branch (supplies L4-L5 facet joint); IBP, intermediate branch plexus; MB3, medial branch of dorsal primary ramus of L3; NR3, third lumbar nerve root; S, superior articular process of L5; SAB, superior articular branches from L4 (supplies L4-5 facet joint also); TP3, transverse process of L3.  Vitals:   09/19/17 0859 09/19/17 0909 09/19/17 0919 09/19/17 0929  BP: 108/69 127/68 121/70 123/67  Pulse:      Resp: 15 18 18 18   Temp:  (!) 97 F (36.1 C)    TempSrc:      SpO2: 95% 93% 95% 99%  Weight:      Height:        Start Time: 0833 hrs. End Time: 0858 hrs. Materials & Medications:  Needle(s) Type: Teflon-coated, curved tip, Radiofrequency needle(s) Gauge: 22G Length: 10cm Medication(s): We administered lactated ringers, fentaNYL, lidocaine (PF), ropivacaine (PF) 2 mg/mL (0.2%), dexamethasone, and ePHEDrine. Please see chart orders for dosing details. 0.33 cc of Decadron 10 mg per cc injected at each level and flushed with 0.2% ropivacaine and cannulas removed.  Of note during the procedure, patient did have a vagal response where he became diaphoretic and blood pressure decreased to high 40J systolic.  No chest pain shortness of breath.  Patient received 5 mg of IV ephedrine and fluid bolus of 100 cc start to feel better.  Thereafter blood pressures were stable. Imaging Guidance (Spinal):  Type of Imaging Technique: Fluoroscopy Guidance (Spinal) Indication(s): Assistance in needle guidance and placement for procedures requiring needle placement in or near specific anatomical locations not easily accessible without such assistance. Exposure Time: Please see nurses notes. Contrast: None used. Fluoroscopic Guidance: I  was personally present during the use of fluoroscopy. "Tunnel Vision Technique" used to obtain the best possible view of the target area. Parallax error corrected before commencing the procedure. "Direction-depth-direction" technique used to introduce the needle under continuous pulsed fluoroscopy. Once target was reached, antero-posterior, oblique, and lateral fluoroscopic projection used confirm needle placement in all planes. Images permanently stored in EMR. Interpretation: No contrast injected. I personally  interpreted the imaging intraoperatively. Adequate needle placement confirmed in multiple planes. Permanent images saved into the patient's record.  Antibiotic Prophylaxis:  Indication(s): None identified Antibiotic given: None  Post-operative Assessment:  EBL: None Complications: No immediate post-treatment complications observed by team, or reported by patient. Note: The patient tolerated the entire procedure well. A repeat set of vitals were taken after the procedure and the patient was kept under observation following institutional policy, for this type of procedure. Post-procedural neurological assessment was performed, showing return to baseline, prior to discharge. The patient was provided with post-procedure discharge instructions, including a section on how to identify potential problems. Should any problems arise concerning this procedure, the patient was given instructions to immediately contact us, at any time, without hesitation. In any case, we plan to contact the patient by telephone for a follow-up status report regarding this interventional procedure. Comments:  No additional relevant information. 5 out of 5 strength bilateral lower extremity: Plantar flexion, dorsiflexion, knee flexion, knee extension.  Plan of Care  Follow-up in 4-6 weeks for postprocedural evaluation.    Imaging Orders     DG C-Arm 1-60 Min-No Report Procedure Orders    No procedure(s) ordered today     Medications ordered for procedure: Meds ordered this encounter  Medications  . lactated ringers infusion 1,000 mL  . fentaNYL (SUBLIMAZE) injection 25-100 mcg    Make sure Narcan is available in the pyxis when using this medication. In the event of respiratory depression (RR< 8/min): Titrate NARCAN (naloxone) in increments of 0.1 to 0.2 mg IV at 2-3 minute intervals, until desired degree of reversal.  . lidocaine (PF) (XYLOCAINE) 1 % injection 10 mL  . ropivacaine (PF) 2 mg/mL (0.2%) (NAROPIN) injection 10 mL  . dexamethasone (DECADRON) injection 10 mg  . ePHEDrine injection 5 mg   Medications administered: We administered lactated ringers, fentaNYL, lidocaine (PF), ropivacaine (PF) 2 mg/mL (0.2%), dexamethasone, and ePHEDrine.  See the medical record for exact dosing, route, and time of administration.  New Prescriptions   No medications on file   Disposition: Discharge home  Discharge Date & Time: 09/19/2017; 0943 hrs.   Physician-requested Follow-up: Return in about 4 weeks (around 10/17/2017) for Post Procedure Evaluation. Future Appointments  Date Time Provider [place]  10/11/2017 10:30 AM Eustace Pen, LPN [facility]  [id number]  9:30 AM Gillis Santa, MD [facility] None  10/18/2017 11:30 AM Ria Bush, MD [facility]   Primary Care Physician: Ria Bush, MD Location[facility] Note by: Gillis Santa, MD Date: 09/19/2017; Time: 9:43 AM  Disclaimer:  Medicine is not an exact science. The only guarantee in medicine is that nothing is guaranteed. It is important to note that the decision to proceed with this intervention was based on the information collected from the patient. The Data and conclusions were drawn from the patient's questionnaire, the interview, and the physical examination. Because the information was provided in large part by the patient, it cannot be guaranteed that it has not been purposely or  unconsciously manipulated. Every effort has been made to obtain as much relevant data as possible for this evaluation. It is important to note that the conclusions that lead to this procedure are derived in large part from the available data. Always take into account that the treatment will also be dependent on availability of resources and existing treatment guidelines, considered by other Pain Management Practitioners as being common knowledge and practice, at the time of the intervention. For Medico-Legal purposes,  it is also important to point out that variation in procedural techniques and pharmacological choices are the acceptable norm. The indications, contraindications, technique, and results of the above procedure should only be interpreted and judged by a Board-Certified Interventional Pain Specialist with extensive familiarity and expertise in the same exact procedure and technique.

## 2017-09-19 NOTE — Progress Notes (Signed)
Safety precautions to be maintained throughout the outpatient stay will include: orient to surroundings, keep bed in low position, maintain call bell within reach at all times, provide assistance with transfer out of bed and ambulation.  

## 2017-09-19 NOTE — Patient Instructions (Signed)
Post RF Discharge Instructions  You have just completed Radiofrequency Neurotomy.  The following instructions will provide you with information and guidelines for self-care upon discharge.  If at any time you have questions or concerns please call your physician.  General Instructions:  Be alert for signs of possible infection: redness, swelling, heat, red streaks, elevated temperature, fever.  Please notify your doctor immediately if you have unusual bleeding, trouble breathing, or loss of the ability to control your bowel or bladder.  What to expect:  Most procedures involve the use of local anesthetics (numbing medicine), steroids (anti-inflammatory medicines) and possibly sedation (relaxation or nerve medicine).  Sedation may affect your memory, not allowing you to remember the procedure, or the instructions that we give you after it.  Because of this, you doctor may want to avoid providing you important information after the procedure, since you may not remember.  The doctor will be more than happy to go over the information upon your return.  Local anesthetics, on the other hand, may cause temporary numbness and weakness of the legs or arms, depending on the location of the block.  This numbness/weaknes may last 4-6 hours ( the duration of the local anesthetic).  During this period of numbness, you must be more careful than usual to prevent any injuries to th extremity.  Steroids will begin to work immediately after injected, but on the average, it will take 6-10 days for the swelling to come down to the point where you will be able to tell a difference in terms of the pain.  In summary, you should expect for your pain to get better within 15-20 minutes after the procedure.  This relieve or numbness should last 4-6 hours, after which, it will wear off.  Once it wears off, you may experience more pain than usual for 4-6 days, or until the steroids "kick in".  This discomfort is due to the procedure  itself.  To minimize this, we recommend applying ice (fill a plastic sandwich bag with ice and wrap it on a towel to prevent frostbite) to the area, 15 minutes on and 15 minutes off, the day of the procedure.  This will minimize any swelling.  Starting the next day, you should then start with heat (moist or dry, it does not matter).  Heat therapy should continue until the pain improves (4-6 days).  Be careful not to burn yourself.  In the case of Radiofrequency procedures, you should expect more pain than usual for 5 to 6 weeks after the procedure.  This is how long it takes the burned tissue to heal.  We cannot assess any definite results on the success of the procedure until this recovery period has elapsed.  Post-Procedure Care:  You will want to be careful in moving about.  Muscle spasms in the area of the injection may occur.  Use ice or heat to the area is often helpful.   Occasionally, a spinal headache can develop.  This is different from a normal headache in the sense that it will not go away on it's own, despite normal measures.  If you develop a headache that does not seem to respond to conservative therapy, please let your physician know.  This can be treated with an epidural blood patch.   You may experience some numbness or redness,m however it should be short lived.  If persistent numbness occurs, contact your physician. (Persistent numbness would be defined as lasting more than 4-6 hours).  Use care in   moving the effected arm or leg to avoid further injury.  You may be given a sling or crutches to use temporarily.  Some discoloration may be present in your arms or leg for 24-48 hours.  If you experience shortness of breath or if your lips or fingers develop a dusky or "blue" appearance, contact your physician or go to the emergency room.  Diet  No eating limitations, unless stipulated above.  Nevertheless, if you have had sedation, you may experience some nausea.  In this case, it may be  wise to wait at least two hours prior to resuming regular diet.  Comfort  You may experience a temporary increase in pain.  You will also experience tenderness at the site of injection, which may be accompanied with muscle spasms.  Use of cold or heat is usually helpful.  Take your prescription as directed.  Always exercise caution when taking pain pills.  Activity:  For the first 24 hours after the procedure, do not drive a motor vehicle,  Operate heavy machinery or power tools or handle any weapons.  Consider walking with the use of an assistive device or accompanied by an adult for the next 24 hours.  Do not drink alcoholic beverages including beer.  Do not make any important decisions or sign any legal documents. Go home and rest today.  Resume activities tomorrow, as tolerated.  Use caution in moving about as you may experience mild leg weakness.  Use caution in cooking, use of household electrical appliances and climbing steps.  Medications  May resume pre-procedure medications.  Do not take any drugs, other than what has been prescribed to you.   Other  

## 2017-09-25 ENCOUNTER — Other Ambulatory Visit: Payer: Self-pay | Admitting: Family Medicine

## 2017-09-27 ENCOUNTER — Other Ambulatory Visit: Payer: Self-pay | Admitting: Student in an Organized Health Care Education/Training Program

## 2017-09-28 ENCOUNTER — Other Ambulatory Visit: Payer: Self-pay | Admitting: Student in an Organized Health Care Education/Training Program

## 2017-10-02 ENCOUNTER — Other Ambulatory Visit: Payer: Self-pay | Admitting: Student in an Organized Health Care Education/Training Program

## 2017-10-02 DIAGNOSIS — M47816 Spondylosis without myelopathy or radiculopathy, lumbar region: Secondary | ICD-10-CM

## 2017-10-02 DIAGNOSIS — G8929 Other chronic pain: Secondary | ICD-10-CM

## 2017-10-02 DIAGNOSIS — M545 Low back pain: Secondary | ICD-10-CM

## 2017-10-02 MED ORDER — NALOXEGOL OXALATE 25 MG PO TABS: 25.0000 mg | ORAL_TABLET | Freq: Every day | ORAL | 2 refills | Status: AC

## 2017-10-03 ENCOUNTER — Telehealth: Payer: Self-pay | Admitting: Internal Medicine

## 2017-10-03 ENCOUNTER — Other Ambulatory Visit: Payer: Self-pay | Admitting: Student in an Organized Health Care Education/Training Program

## 2017-10-03 DIAGNOSIS — T402X5A Adverse effect of other opioids, initial encounter: Principal | ICD-10-CM

## 2017-10-03 DIAGNOSIS — K5903 Drug induced constipation: Secondary | ICD-10-CM

## 2017-10-03 NOTE — Telephone Encounter (Signed)
-----   Message from [place], Generic sent at 10/01/2017 6:08 PM EST -----    Appointment Request From: Anthony [name]    With Provider: Virl Axe, MD [place]    Preferred Date Range: 12/10/2017 - 12/21/2017    Preferred Times: Any time    Reason for visit: Request an Appointment    Comments:  Follow up a-flutter and CAD   Scheduled 4/25 at 930 patient confirmation pending

## 2017-10-06 ENCOUNTER — Encounter: Payer: Self-pay | Admitting: Family Medicine

## 2017-10-09 ENCOUNTER — Other Ambulatory Visit: Payer: Self-pay | Admitting: Family Medicine

## 2017-10-09 DIAGNOSIS — E782 Mixed hyperlipidemia: Secondary | ICD-10-CM

## 2017-10-09 DIAGNOSIS — E039 Hypothyroidism, unspecified: Secondary | ICD-10-CM

## 2017-10-11 ENCOUNTER — Ambulatory Visit (INDEPENDENT_AMBULATORY_CARE_PROVIDER_SITE_OTHER): Payer: Medicare Other

## 2017-10-11 VITALS — BP 128/84 | HR 62 | Temp 97.9°F | Ht 72.25 in | Wt 287.5 lb

## 2017-10-11 DIAGNOSIS — Z0000 Encounter for general adult medical examination without abnormal findings: Secondary | ICD-10-CM

## 2017-10-11 DIAGNOSIS — E782 Mixed hyperlipidemia: Secondary | ICD-10-CM | POA: Diagnosis not present

## 2017-10-11 DIAGNOSIS — E039 Hypothyroidism, unspecified: Secondary | ICD-10-CM | POA: Diagnosis not present

## 2017-10-11 LAB — COMPREHENSIVE METABOLIC PANEL
ALK PHOS: 49 U/L (ref 39–117)
ALT: 23 U/L (ref 0–53)
AST: 21 U/L (ref 0–37)
Albumin: 4.1 g/dL (ref 3.5–5.2)
BILIRUBIN TOTAL: 0.6 mg/dL (ref 0.2–1.2)
BUN: 26 mg/dL — ABNORMAL HIGH (ref 6–23)
CO2: 31 meq/L (ref 19–32)
CREATININE: 1.45 mg/dL (ref 0.40–1.50)
Calcium: 10.4 mg/dL (ref 8.4–10.5)
Chloride: 102 mEq/L (ref 96–112)
GFR: 49.77 mL/min — AB (ref >60.00)
GLUCOSE: 113 mg/dL — AB (ref 70–99)
Potassium: 4.3 mEq/L (ref 3.5–5.1)
Sodium: 139 mEq/L (ref 135–145)
TOTAL PROTEIN: 7.5 g/dL (ref 6.0–8.3)

## 2017-10-11 LAB — LIPID PANEL
Cholesterol: 199 mg/dL (ref 0–200)
HDL: 41.3 mg/dL (ref >39.00)
NONHDL: 157.82
Total CHOL/HDL Ratio: 5
Triglycerides: 253 mg/dL — ABNORMAL HIGH (ref 0.0–149.0)
VLDL: 50.6 mg/dL — ABNORMAL HIGH (ref 0.0–40.0)

## 2017-10-11 LAB — TSH: TSH: 0.51 u[IU]/mL (ref 0.35–4.50)

## 2017-10-11 LAB — LDL CHOLESTEROL, DIRECT: LDL DIRECT: 108 mg/dL

## 2017-10-11 NOTE — Progress Notes (Signed)
PCP notes:   Health maintenance:  No gaps identified.  Abnormal screenings:   Hearing - failed  Hearing Screening   125Hz  250Hz  500Hz  1000Hz  2000Hz  3000Hz  4000Hz  6000Hz  8000Hz   Right ear:   40 0 40  0    Left ear:   40 0 40  0     Depression score: 3 Depression screen [facility] 2/9 10/11/2017 09/13/2017 08/07/2017 07/09/2017 07/05/2017  Decreased Interest 0 0 0 0 0  Down, Depressed, Hopeless 0 0 0 0 0  PHQ - 2 Score 0 0 0 0 0  Altered sleeping 0 - - - -  Tired, decreased energy 3 - - - -  Change in appetite 0 - - - -  Feeling bad or failure about yourself  0 - - - -  Trouble concentrating 0 - - - -  Moving slowly or fidgety/restless 0 - - - -  Suicidal thoughts 0 - - - -  PHQ-9 Score 3 - - - -  Difficult doing work/chores Somewhat difficult - - - -   Patient concerns:   None  Nurse concerns:  None  Next PCP appt:   10/18/17 @ 0930

## 2017-10-11 NOTE — Patient Instructions (Signed)
Anthony [name] , Thank you for taking time to come for your Medicare Wellness Visit. I appreciate your ongoing commitment to your health goals. Please review the following plan we discussed and let me know if I can assist you in the future.   These are the goals we discussed: Goals    . Increase physical activity     Starting 10/11/2017, I will continue to exercise for at least 45 minutes daily.        This is a list of the screening recommended for you and due dates:  Health Maintenance  Topic Date Due  . DTaP/Tdap/Td vaccine (1 - Tdap) 10/11/2018*  . Tetanus Vaccine  10/11/2018*  . Pneumonia vaccines (1 of 2 - PCV13) 10/11/2018*  . Flu Shot  Completed  *Topic was postponed. The date shown is not the original due date.   Preventive Care for Adults  A healthy lifestyle and preventive care can promote health and wellness. Preventive health guidelines for adults include the following key practices.  . A routine yearly physical is a good way to check with your health care provider about your health and preventive screening. It is a chance to share any concerns and updates on your health and to receive a thorough exam.  . Visit your dentist for a routine exam and preventive care every 6 months. Brush your teeth twice a day and floss once a day. Good oral hygiene prevents tooth decay and gum disease.  . The frequency of eye exams is based on your age, health, family medical history, use  of contact lenses, and other factors. Follow your health care provider's recommendations for frequency of eye exams.  . Eat a healthy diet. Foods like vegetables, fruits, whole grains, low-fat dairy products, and lean protein foods contain the nutrients you need without too many calories. Decrease your intake of foods high in solid fats, added sugars, and salt. Eat the right amount of calories for you. Get information about a proper diet from your health care provider, if necessary.  . Regular physical  exercise is one of the most important things you can do for your health. Most adults should get at least 150 minutes of moderate-intensity exercise (any activity that increases your heart rate and causes you to sweat) each week. In addition, most adults need muscle-strengthening exercises on 2 or more days a week.  Silver Sneakers may be a benefit available to you. To determine eligibility, you may visit the website: www.silversneakers.com or contact program at 770-444-3372 Mon-Fri between 8AM-8PM.   . Maintain a healthy weight. The body mass index (BMI) is a screening tool to identify possible weight problems. It provides an estimate of body fat based on height and weight. Your health care provider can find your BMI and can help you achieve or maintain a healthy weight.   For adults 20 years and older: ? A BMI below 18.5 is considered underweight. ? A BMI of 18.5 to 24.9 is normal. ? A BMI of 25 to 29.9 is considered overweight. ? A BMI of 30 and above is considered obese.   . Maintain normal blood lipids and cholesterol levels by exercising and minimizing your intake of saturated fat. Eat a balanced diet with plenty of fruit and vegetables. Blood tests for lipids and cholesterol should begin at age 10 and be repeated every 5 years. If your lipid or cholesterol levels are high, you are over 50, or you are at high risk for heart disease, you may need your  cholesterol levels checked more frequently. Ongoing high lipid and cholesterol levels should be treated with medicines if diet and exercise are not working.  . If you smoke, find out from your health care provider how to quit. If you do not use tobacco, please do not start.  . If you choose to drink alcohol, please do not consume more than 2 drinks per day. One drink is considered to be 12 ounces (355 mL) of beer, 5 ounces (148 mL) of wine, or 1.5 ounces (44 mL) of liquor.  . If you are 3-24 years old, ask your health care provider if you  should take aspirin to prevent strokes.  . Use sunscreen. Apply sunscreen liberally and repeatedly throughout the day. You should seek shade when your shadow is shorter than you. Protect yourself by wearing long sleeves, pants, a wide-brimmed hat, and sunglasses year round, whenever you are outdoors.  . Once a month, do a whole body skin exam, using a mirror to look at the skin on your back. Tell your health care provider of new moles, moles that have irregular borders, moles that are larger than a pencil eraser, or moles that have changed in shape or color.

## 2017-10-11 NOTE — Progress Notes (Signed)
Subjective:   Anthony [name] is a 82 y.o. male who presents for Medicare Annual/Subsequent preventive examination.  Review of Systems:  N/A Cardiac Risk Factors include: advanced age (>5men, >5 women);male gender;obesity (BMI >30kg/m2);hypertension     Objective:    Vitals: BP 128/84 (BP Location: Left Arm, Patient Position: Sitting, Cuff Size: Large)   Pulse 62   Temp 97.9 F (36.6 C) (Oral)   Ht 6' 0.25" (1.835 m) Comment: no shoes  Wt 287 lb 8 oz (130.4 kg)   SpO2 92%   BMI 38.72 kg/m   Body mass index is 38.72 kg/m.  Advanced Directives 10/11/2017 09/13/2017 06/21/2017 06/13/2017 05/07/2017 04/24/2017  Does Patient Have a Medical Advance Directive? Yes Yes Yes Yes No Yes  Type of Paramedic of [place];Living will [place];Living will - [place];Living will - Living will;Healthcare Power of [place] in Chart? No - copy requested No - copy requested - - - -  Would patient like information on creating a medical advance directive? - - - - No - Patient declined -    Tobacco Social History   Tobacco Use  Smoking Status Never Smoker  Smokeless Tobacco Never Used     Counseling given: No   Clinical Intake:  Pre-visit preparation completed: Yes  Pain : No/denies pain Pain Score: 2      Nutritional Status: BMI > 30  Obese Nutritional Risks: None Diabetes: No  How often do you need to have someone help you when you read instructions, pamphlets, or other written materials from your doctor or pharmacy?: 1 - Never What is the last grade level you completed in school?: 12th grade + 2 yrs college  Interpreter Needed?: No  Comments: pt is a widower and lives alone Information entered by :: LPinson, LPN  Past Medical History:  Diagnosis Date  . Anosmia 1980s  . Atrial flutter ([place])   . CAD (coronary artery disease)   . Chronic constipation   . Chronic insomnia     . Chronic lower back pain    s/p spine stimulator placement  . Diastolic CHF ([place])   . History of diverticulitis   . Hyperlipidemia   . Hypertension   . Hypothyroidism   . Obesity, Class II, BMI 35-39.9, with comorbidity   . OSA on CPAP    Past Surgical History:  Procedure Laterality Date  . A-FLUTTER ABLATION N/A 05/07/2017   Procedure: A-Flutter Ablation;  Surgeon: Deboraha Sprang, MD;  Location: [facility];  Service: Cardiovascular;  Laterality: N/A;  . NASAL SINUS SURGERY     nasal polyps  . PERCUTANEOUS CORONARY STENT INTERVENTION (PCI-S)     stent placement  . RADIOFREQUENCY ABLATION  06/2017   lumbar region [facility])  . REPLACEMENT TOTAL KNEE BILATERAL Bilateral 2000s  . SPINAL CORD STIMULATOR IMPLANT  06/2016   Family History  Problem Relation Age of Onset  . Stroke Mother   . Kidney disease Father   . Leukemia Brother   . Stroke Sister   . Diabetes Neg Hx    Social History   Socioeconomic History  . Marital status: Widowed    Spouse name: None  . Number of children: None  . Years of education: None  . Highest education level: None  Social Needs  . Financial resource strain: None  . Food insecurity - worry: None  . Food insecurity - inability: None  . Transportation needs -  medical: None  . Transportation needs - non-medical: None  Occupational History  . None  Tobacco Use  . Smoking status: Never Smoker  . Smokeless tobacco: Never Used  Substance and Sexual Activity  . Alcohol use: No  . Drug use: No  . Sexual activity: No  Other Topics Concern  . None  Social History Narrative   Widower. Lives alone, GF Miss [name] sometimes (she was nurse)   Son is [name] (FP at [employer])   [place]: retired, used to manage grocery store   Edu: HS    Outpatient Encounter Medications as of 10/11/2017  Medication Sig  . aspirin 81 MG tablet Take 81 mg by mouth daily.  . diphenhydramine-acetaminophen (TYLENOL PM EXTRA STRENGTH)  25-500 MG TABS tablet Take 1 tablet by mouth at bedtime as needed. (Patient taking differently: Take 1 tablet by mouth at bedtime as needed (for sleep.). )  . docusate sodium (COLACE) 100 MG capsule Take 100 mg by mouth 2 (two) times daily.  . Eszopiclone 3 MG TABS Take 3 mg by mouth at bedtime as needed. Take immediately before bedtime   . ezetimibe (ZETIA) 10 MG tablet Take 1 tablet (10 mg total) by mouth daily.  . fenofibrate 160 MG tablet TAKE 1 TABLET BY MOUTH EVERY DAY  . finasteride (PROSCAR) 5 MG tablet Take 1 tablet (5 mg total) by mouth daily.  . furosemide (LASIX) 40 MG tablet TAKE 1 TABLET BY MOUTH EVERY DAY. TAKE AN EXTRA TABLET AS NEEDED FOR WEIGHT GAIN OF>3LB/DAY OR 5LB/WEEK  . HYDROcodone-acetaminophen (NORCO/VICODIN) 5-325 MG tablet Take 1 tablet by mouth 3 (three) times daily as needed for moderate pain. For chronic pain To fill on or after: 08/07/2017, 09/06/2017  . ketoconazole (NIZORAL) 2 % cream Apply 1 application topically daily as needed (for dermatitits).   Marland Kitchen levothyroxine (SYNTHROID, LEVOTHROID) 200 MCG tablet Take 1 tablet (200 mcg total) by mouth daily before breakfast.  . linaclotide (LINZESS) 145 MCG CAPS capsule Take 1 capsule (145 mcg total) by mouth daily. Take on an empty stomach at least 30 minutes before 1st meal of the day (Patient taking differently: Take 290 mcg by mouth daily. Take on an empty stomach at least 30 minutes before 1st meal of the day)  . lisinopril (PRINIVIL,ZESTRIL) 10 MG tablet Take 1 tablet (10 mg total) by mouth daily.  Marland Kitchen loratadine (CLARITIN) 10 MG tablet Take 1 tablet (10 mg total) by mouth daily.  . metoprolol succinate (TOPROL-XL) 50 MG 24 hr tablet Take 25 mg by mouth daily.   . Multiple Vitamin (MULTIVITAMIN) capsule Take 1 capsule by mouth daily.  . multivitamin-lutein (OCUVITE-LUTEIN) CAPS capsule Take 1 capsule by mouth daily.  . naloxegol oxalate (MOVANTIK) 25 MG TABS tablet Take 1 tablet (25 mg total) by mouth daily. Take on an  empty stomach at least 1 hour before or 2 hours after a meal.  . nitroGLYCERIN (NITROSTAT) 0.4 MG SL tablet Place 0.4 mg under the tongue every 5 (five) minutes as needed for chest pain.  Marland Kitchen omega-3 acid ethyl esters (LOVAZA) 1 g capsule Take 2 capsules (2 g total) by mouth 2 (two) times daily.  . potassium chloride (K-DUR,KLOR-CON) 10 MEQ tablet Take 1 tablet (10 mEq total) by mouth daily.  . tamsulosin (FLOMAX) 0.4 MG CAPS capsule Take 2 capsules (0.8 mg total) by mouth daily after supper.  Marland Kitchen tiZANidine (ZANAFLEX) 4 MG capsule Take 1 capsule (4 mg total) by mouth 2 (two) times daily as needed for  muscle spasms.   No facility-administered encounter medications on file as of 10/11/2017.     Activities of Daily Living In your present state of health, do you have any difficulty performing the following activities: 10/11/2017 05/07/2017  Hearing? N N  Vision? N N  Difficulty concentrating or making decisions? N N  Walking or climbing stairs? N N  Dressing or bathing? N N  Doing errands, shopping? N -  Preparing Food and eating ? N -  Using the Toilet? N -  In the past six months, have you accidently leaked urine? N -  Do you have problems with loss of bowel control? N -  Managing your Medications? N -  Managing your Finances? N -  Housekeeping or managing your Housekeeping? N -    Patient Care Team: Ria Bush, MD as PCP - General (Family Medicine)   Assessment:   This is a routine wellness examination for Maurion.   Hearing Screening   125Hz  250Hz  500Hz  1000Hz  2000Hz  3000Hz  4000Hz  6000Hz  8000Hz   Right ear:   40 0 40  0    Left ear:   40 0 40  0      Visual Acuity Screening   Right eye Left eye Both eyes  Without correction: 20/25 20/25-2 20/25-1  With correction:        Exercise Activities and Dietary recommendations Current Exercise Habits: Home exercise routine, Type of exercise: calisthenics;stretching, Time (Minutes): 45, Frequency (Times/Week): 7, Weekly Exercise  (Minutes/Week): 315, Intensity: Mild, Exercise limited by: orthopedic condition(s)  Goals    . Increase physical activity     Starting 10/11/2017, I will continue to exercise for at least 45 minutes daily.        Fall Risk Fall Risk  10/11/2017 09/19/2017 09/13/2017 08/07/2017 07/09/2017  Falls in the past year? No No No No No   Depression Screen PHQ 2/9 Scores 10/11/2017 09/13/2017 08/07/2017 07/09/2017  PHQ - 2 Score 0 0 0 0  PHQ- 9 Score 3 - - -    Cognitive Function MMSE - Mini Mental State Exam 10/11/2017  Orientation to time 5  Orientation to Place 5  Registration 3  Attention/ Calculation 0  Recall 3  Language- name 2 objects 0  Language- repeat 1  Language- follow 3 step command 3  Language- read & follow direction 0  Write a sentence 0  Copy design 0  Total score 20       PLEASE NOTE: A Mini-Cog screen was completed. Maximum score is 20. A value of 0 denotes this part of [place] MMSE was not completed or the patient failed this part of the Mini-Cog screening.   Mini-Cog Screening Orientation to Time - Max 5 pts Orientation to Place - Max 5 pts Registration - Max 3 pts Recall - Max 3 pts Language Repeat - Max 1 pts Language Follow 3 Step Command - Max 3 pts   Immunization History  Administered Date(s) Administered  . Hepatitis A, Adult 12/12/1996, 06/30/1997  . Influenza,inj,Quad PF,6+ Mos 07/24/2017  . Pneumococcal Conjugate-13 10/20/2013  . Pneumococcal Polysaccharide-23 02/17/2011, 10/09/2011  . Pneumococcal-Unspecified 08/28/2001  . Td 08/28/2001, 10/09/2011  . Zoster 08/28/2013    Screening Tests Health Maintenance  Topic Date Due  . DTaP/Tdap/Td (1 - Tdap) 10/08/2021 (Originally 10/10/2011)  . TETANUS/TDAP  10/08/2021  . INFLUENZA VACCINE  Completed  . PNA vac Low Risk Adult  Completed      Plan:     I have personally reviewed, addressed, and noted the following  in the patient's chart:  A. Medical and social history B. Use of alcohol,  tobacco or illicit drugs  C. Current medications and supplements D. Functional ability and status E.  Nutritional status F.  Physical activity G. Advance directives H. List of other physicians I.  Hospitalizations, surgeries, and ER visits in previous 12 months J.  [place] to include hearing, vision, cognitive, depression L. Referrals and appointments - none  In addition, I have reviewed and discussed with patient certain preventive protocols, quality metrics, and best practice recommendations. A written personalized care plan for preventive services as well as general preventive health recommendations were provided to patient.  See attached scanned questionnaire for additional information.   Signed,   Lindell Noe, MHA, BS, LPN Health Coach

## 2017-10-11 NOTE — Progress Notes (Signed)
Pre visit review using our clinic review tool, if applicable. No additional management support is needed unless otherwise documented below in the visit note. 

## 2017-10-18 ENCOUNTER — Encounter: Payer: Self-pay | Admitting: Family Medicine

## 2017-10-18 ENCOUNTER — Other Ambulatory Visit: Payer: Self-pay

## 2017-10-18 ENCOUNTER — Ambulatory Visit (INDEPENDENT_AMBULATORY_CARE_PROVIDER_SITE_OTHER): Payer: Medicare Other | Admitting: Family Medicine

## 2017-10-18 ENCOUNTER — Ambulatory Visit (HOSPITAL_BASED_OUTPATIENT_CLINIC_OR_DEPARTMENT_OTHER): Payer: Medicare Other | Admitting: Student in an Organized Health Care Education/Training Program

## 2017-10-18 ENCOUNTER — Ambulatory Visit
Admission: RE | Admit: 2017-10-18 | Discharge: 2017-10-18 | Disposition: A | Discharge status: Home / Self Care | Payer: Medicare Other | Source: Ambulatory Visit | Attending: Student in an Organized Health Care Education/Training Program | Admitting: Student in an Organized Health Care Education/Training Program

## 2017-10-18 VITALS — BP 124/78 | HR 61 | Temp 98.2°F | Ht 72.0 in | Wt 284.0 lb

## 2017-10-18 VITALS — BP 140/86 | HR 69 | Temp 98.6°F | Resp 16 | Ht 72.0 in | Wt 280.0 lb

## 2017-10-18 DIAGNOSIS — M549 Dorsalgia, unspecified: Secondary | ICD-10-CM | POA: Diagnosis not present

## 2017-10-18 DIAGNOSIS — E039 Hypothyroidism, unspecified: Secondary | ICD-10-CM

## 2017-10-18 DIAGNOSIS — Z86008 Personal history of in-situ neoplasm of other site: Secondary | ICD-10-CM

## 2017-10-18 DIAGNOSIS — Z79899 Other long term (current) drug therapy: Secondary | ICD-10-CM | POA: Insufficient documentation

## 2017-10-18 DIAGNOSIS — Z9889 Other specified postprocedural states: Secondary | ICD-10-CM | POA: Insufficient documentation

## 2017-10-18 DIAGNOSIS — I10 Essential (primary) hypertension: Secondary | ICD-10-CM

## 2017-10-18 DIAGNOSIS — Z9689 Presence of other specified functional implants: Secondary | ICD-10-CM

## 2017-10-18 DIAGNOSIS — K5903 Drug induced constipation: Secondary | ICD-10-CM | POA: Diagnosis not present

## 2017-10-18 DIAGNOSIS — I5030 Unspecified diastolic (congestive) heart failure: Secondary | ICD-10-CM

## 2017-10-18 DIAGNOSIS — E782 Mixed hyperlipidemia: Secondary | ICD-10-CM

## 2017-10-18 DIAGNOSIS — K5909 Other constipation: Secondary | ICD-10-CM | POA: Diagnosis not present

## 2017-10-18 DIAGNOSIS — N400 Enlarged prostate without lower urinary tract symptoms: Secondary | ICD-10-CM | POA: Diagnosis not present

## 2017-10-18 DIAGNOSIS — R0981 Nasal congestion: Secondary | ICD-10-CM | POA: Insufficient documentation

## 2017-10-18 DIAGNOSIS — G4733 Obstructive sleep apnea (adult) (pediatric): Secondary | ICD-10-CM | POA: Insufficient documentation

## 2017-10-18 DIAGNOSIS — N183 Chronic kidney disease, stage 3 unspecified: Secondary | ICD-10-CM

## 2017-10-18 DIAGNOSIS — M47816 Spondylosis without myelopathy or radiculopathy, lumbar region: Secondary | ICD-10-CM | POA: Diagnosis not present

## 2017-10-18 DIAGNOSIS — Z7982 Long term (current) use of aspirin: Secondary | ICD-10-CM | POA: Insufficient documentation

## 2017-10-18 DIAGNOSIS — Z85828 Personal history of other malignant neoplasm of skin: Secondary | ICD-10-CM

## 2017-10-18 DIAGNOSIS — I5032 Chronic diastolic (congestive) heart failure: Secondary | ICD-10-CM | POA: Diagnosis not present

## 2017-10-18 DIAGNOSIS — M545 Low back pain, unspecified: Secondary | ICD-10-CM

## 2017-10-18 DIAGNOSIS — Z9989 Dependence on other enabling machines and devices: Secondary | ICD-10-CM

## 2017-10-18 DIAGNOSIS — M5136 Other intervertebral disc degeneration, lumbar region: Secondary | ICD-10-CM | POA: Diagnosis not present

## 2017-10-18 DIAGNOSIS — T402X5A Adverse effect of other opioids, initial encounter: Secondary | ICD-10-CM

## 2017-10-18 DIAGNOSIS — G8929 Other chronic pain: Secondary | ICD-10-CM

## 2017-10-18 DIAGNOSIS — E781 Pure hyperglyceridemia: Secondary | ICD-10-CM | POA: Insufficient documentation

## 2017-10-18 DIAGNOSIS — Z86007 Personal history of in-situ neoplasm of skin: Secondary | ICD-10-CM

## 2017-10-18 DIAGNOSIS — Z5181 Encounter for therapeutic drug level monitoring: Secondary | ICD-10-CM | POA: Diagnosis not present

## 2017-10-18 DIAGNOSIS — I2510 Atherosclerotic heart disease of native coronary artery without angina pectoris: Secondary | ICD-10-CM | POA: Diagnosis not present

## 2017-10-18 MED ORDER — KETOROLAC TROMETHAMINE 30 MG/ML IJ SOLN
30.0000 mg | Freq: Once | INTRAMUSCULAR | Status: AC
  Administered 2017-10-18: 30 mg via INTRAMUSCULAR
  Filled 2017-10-18 (×2): qty 1

## 2017-10-18 MED ORDER — POLYETHYLENE GLYCOL 3350 17 GM/SCOOP PO POWD: 17.0000 g | Freq: Two times a day (BID) | ORAL | 1 refills | Status: DC | PRN

## 2017-10-18 MED ORDER — PSYLLIUM 51.7 % PO PACK: 5.0000 mL | PACK | Freq: Every day | ORAL | Status: DC

## 2017-10-18 MED ORDER — LINACLOTIDE 290 MCG PO CAPS: 290.0000 ug | ORAL_CAPSULE | Freq: Every day | ORAL | 3 refills | Status: DC

## 2017-10-18 MED ORDER — HYDROCODONE-ACETAMINOPHEN 5-325 MG PO TABS: 1.0000 | ORAL_TABLET | Freq: Three times a day (TID) | ORAL | 0 refills | Status: DC | PRN

## 2017-10-18 MED ORDER — SENNA 8.6 MG PO TABS: 3.0000 | ORAL_TABLET | Freq: Every day | ORAL | 0 refills | Status: DC | PRN

## 2017-10-18 MED ORDER — ORPHENADRINE CITRATE 30 MG/ML IJ SOLN
30.0000 mg | Freq: Once | INTRAMUSCULAR | Status: AC
  Administered 2017-10-18: 30 mg via INTRAMUSCULAR
  Filled 2017-10-18 (×2): qty 2

## 2017-10-18 MED ORDER — ORLISTAT 60 MG PO CAPS: 60.0000 mg | ORAL_CAPSULE | Freq: Every day | ORAL | Status: DC | PRN

## 2017-10-18 NOTE — Assessment & Plan Note (Addendum)
Majority of visit spent discussing bowel regimen. It seems he has both slow transit constipation as well as some narcotic induced constipation and possibly other. Previously managed on linzess but it sounds like he was only taking intermittently with good but limited effect. Reviewed bowel regimen as well as strategy to help manage this. Reviewed importance of regular fiber and water intake in diet.  Will restart linzess 263mcg daily (had run out). Will add further to bowel regimen as needed (metamucil soluble fiber supplement, miralax powder, sennokot, docusate, etc). He has also been taking Alli OTC PRN constipation - suggested he stop at this time. I did ask him to keep diary with bowel movements and bowel regimen used each day. RTC 3 mo f/u. No red flags noted today.

## 2017-10-18 NOTE — Assessment & Plan Note (Signed)
Chronic, stable on zetia and fenofibrate. Intolerant to statins. Reviewed FLP - with high triglycerides, reviewed healthy diet chances to affect improved triglyceride levels.

## 2017-10-18 NOTE — Progress Notes (Signed)
Safety precautions to be maintained throughout the outpatient stay will include: orient to surroundings, keep bed in low position, maintain call bell within reach at all times, provide assistance with transfer out of bed and ambulation.  

## 2017-10-18 NOTE — Assessment & Plan Note (Signed)
Has movantik for constipation related to hydrocodone, taken PRN

## 2017-10-18 NOTE — Assessment & Plan Note (Addendum)
Endorses compliance with CPAP machine. Looking into local durable medical equipment company to order machine and supplies.

## 2017-10-18 NOTE — Assessment & Plan Note (Signed)
Chronic, stable. Continue levothyroxine 242mcg daily.

## 2017-10-18 NOTE — Assessment & Plan Note (Signed)
Chronic, stable. Continue current regimen. 

## 2017-10-18 NOTE — Assessment & Plan Note (Addendum)
Seems euvolemic. Chronic, stable on lasix 40mg  and potassium daily.

## 2017-10-18 NOTE — Assessment & Plan Note (Addendum)
Planning on joining local club. He is also interested in expensive residential weight loss center through [facility]. I provided with info for local [facility] weight management clinic offered by Dr Leafy Ro.

## 2017-10-18 NOTE — Progress Notes (Signed)
Patient's Name: PHINNEAS SHAKOOR  MRN: [id number]  Referring Provider: Ria Bush, MD  DOB: Nov 13, 1935  PCP: Ria Bush, MD  DOS: 10/18/2017  Note by: Gillis Santa, MD  Service setting: Ambulatory outpatient  Specialty: Interventional Pain Management  Location: [facility] (AMB) Pain Management Facility    Patient type: Established   Primary Reason(s) for Visit: Encounter for prescription drug management & post-procedure evaluation of chronic illness with mild to moderate exacerbation(Level of risk: moderate) CC: Back Pain  HPI  Mr. Hamrick is a 82 y.o. year old, male patient, who comes today for a post-procedure evaluation and medication management. He has History of basal cell carcinoma (BCC); History of squamous cell carcinoma in situ of skin; Hypercholesterolemia with hypertriglyceridemia; Chronic constipation; OSA on CPAP; BPH (benign prostatic hyperplasia); Atrial flutter ([place]); Chronic insomnia; (HFpEF) heart failure with preserved ejection fraction ([place]); Hypertension; Hypothyroidism; Obesity, Class II, BMI 35-39.9, with comorbidity; CAD (coronary artery disease); Chronic lower back pain; Nasal congestion; Therapeutic opioid-induced constipation (OIC); and CKD (chronic kidney disease) stage 3, GFR 30-59 ml/min (HCC) on their problem list. His primarily concern today is the Back Pain  Pain Assessment: Location: Lower Back Radiating: left upper leg Onset: More than a month ago Duration: Chronic pain Quality: Aching Severity: 6 /10 (self-reported pain score)  Note: Reported level is inconsistent with clinical observations. Clinically the patient looks like a 2/10             When using our objective Pain Scale, levels between 6 and 10/10 are said to belong in an emergency room, as it progressively worsens from a 6/10, described as severely limiting, requiring emergency care not usually available at an outpatient pain management facility. At a 6/10 level, communication becomes difficult and  requires great effort. Assistance to reach the emergency department may be required. Facial flushing and profuse sweating along with potentially dangerous increases in heart rate and blood pressure will be evident. Effect on ADL:   Timing: Intermittent Modifying factors: sitting  Mr. Cansler was last seen on 09/28/2017 for a procedure. During today's appointment we reviewed Mr. Paullin post-procedure results, as well as his outpatient medication regimen.  Patient returns today for follow-up status post left L3, L4, L5 radiofrequency ablation.  Patient notes approximately 50% improvement in his axial low back pain symptoms although he does have difficulty with reaching overhead and standing for extended periods of time.  He states that the radiofrequency ablation has helped decrease the extent of his pain so that he is able to perform activities of daily living with greater ease.  Further details on both, my assessment(s), as well as the proposed treatment plan, please see below.  Controlled Substance Pharmacotherapy Assessment REMS (Risk Evaluation and Mitigation Strategy)  Analgesic: Hydrocodone 5 mg 3 times daily as needed, quantity 59-monthMME/day: 15 mg/day.  WLandis Martins RN  10/18/2017  9:08 AM  Sign at close encounter Safety precautions to be maintained throughout the outpatient stay will include: orient to surroundings, keep bed in low position, maintain call bell within reach at all times, provide assistance with transfer out of bed and ambulation.  Pharmacokinetics: Liberation and absorption (onset of action): WNL Distribution (time to peak effect): WNL Metabolism and excretion (duration of action): WNL         Pharmacodynamics: Desired effects: Analgesia: Mr. FBelsitoreports >50% benefit. Functional ability: Patient reports that medication allows him to accomplish basic ADLs Clinically meaningful improvement in function (CMIF): Sustained CMIF goals met Perceived effectiveness:  Described as relatively effective,  allowing for increase in activities of daily living (ADL) Undesirable effects: Side-effects or Adverse reactions: None reported Monitoring: [place] PMP: Online review of the past 4-monthperiod conducted. Compliant with practice rules and regulations Last UDS on record: Summary  Date Value Ref Range Status  04/24/2017 FINAL  Final    Comment:    ==================================================================== TOXASSURE COMP DRUG ANALYSIS,UR ==================================================================== Test                             Result       Flag       Units Drug Present and Declared for Prescription Verification   Hydrocodone                    173          EXPECTED   ng/mg creat   Norhydrocodone                 195          EXPECTED   ng/mg creat    Sources of hydrocodone include scheduled prescription    medications. Norhydrocodone is an expected metabolite of    hydrocodone.   Acetaminophen                  PRESENT      EXPECTED   Diphenhydramine                PRESENT      EXPECTED   Metoprolol                     PRESENT      EXPECTED Drug Absent but Declared for Prescription Verification   Tizanidine                     Not Detected UNEXPECTED    Tizanidine, as indicated in the declared medication list, is not    always detected even when used as directed.   Zopiclone/Eszopiclone          Not Detected UNEXPECTED    Eszopiclone, as indicated in the declared medication list, is not    always detected even when used as directed.   Zaleplon                       Not Detected UNEXPECTED    Zaleplon, as indicated in the declared medication list, is not    always detected even when used as directed.   Salicylate                     Not Detected UNEXPECTED    Aspirin, as indicated in the declared medication list, is not    always detected even when used as  directed. ==================================================================== Test                      Result    Flag   Units      Ref Range   Creatinine              78               mg/dL      >=20 ==================================================================== Declared Medications:  The flagging and interpretation on this report are based on the  following declared medications.  Unexpected results may arise from  inaccuracies in the declared medications.  **Note: The testing scope of this panel  includes these medications:  Diphenhydramine (Tylenol PM)  Hydrocodone (Hydrocodone-Acetaminophen)  Metoprolol  **Note: The testing scope of this panel does not include small to  moderate amounts of these reported medications:  Acetaminophen (Hydrocodone-Acetaminophen)  Acetaminophen (Tylenol PM)  Aspirin  Eszopiclone  Tizanidine  Zaleplon ([place])  **Note: The testing scope of this panel does not include following  reported medications:  Apixaban (Eliquis)  Docusate (Colace)  Ezetimibe (Zetia)  Fenofibrate  Finasteride (Proscar)  Furosemide (Lasix)  Ketoconazole  Levothyroxine  Linaclotide (Linzess)  Lisinopril  Montelukast (Singulair)  Multivitamin  Nitroglycerin  Omega-3 Fatty Acids (Lovaza)  Polyethylene Glycol  Potassium  Tamsulosin (Flomax) ==================================================================== For clinical consultation, please call [phone]. ====================================================================    UDS interpretation: Compliant          Medication Assessment Form: Reviewed. Patient indicates being compliant with therapy Treatment compliance: Compliant Risk Assessment Profile: Aberrant behavior: See prior evaluations. None observed or detected today Comorbid factors increasing risk of overdose: See prior notes. No additional risks detected today Risk of substance use disorder (SUD): Low Opioid Risk Tool - 09/13/17 1103       Family History of Substance Abuse   Alcohol  Negative    Illegal Drugs  Negative    Rx Drugs  Negative      Personal History of Substance Abuse   Alcohol  Negative    Illegal Drugs  Negative    Rx Drugs  Negative      Age   Age between 19-45 years   No      History of Preadolescent Sexual Abuse   History of Preadolescent Sexual Abuse  Negative or Male      Psychological Disease   Psychological Disease  Negative    Depression  Negative      Total Score   Opioid Risk Tool Scoring  0    Opioid Risk Interpretation  Low Risk      ORT Scoring interpretation table:  Score <3 = Low Risk for SUD  Score between 4-7 = Moderate Risk for SUD  Score >8 = High Risk for Opioid Abuse   Risk Mitigation Strategies:  Patient Counseling: Covered Patient-Prescriber Agreement (PPA): Present and active  Notification to other healthcare providers: Done  Pharmacologic Plan: No change in therapy, at this time.             Post-Procedure Assessment  09/28/2017 Procedure: Left L3, L4, L5 MBNB Pre-procedure pain score:  2/10 Post-procedure pain score: 0/10         Influential Factors: BMI: 37.97 kg/m Intra-procedural challenges: None observed.         Assessment challenges: None detected.              Reported side-effects: None.        Post-procedural adverse reactions or complications: None reported         Sedation: Please see nurses note. When no sedatives are used, the analgesic levels obtained are directly associated to the effectiveness of the local anesthetics. However, when sedation is provided, the level of analgesia obtained during the initial 1 hour following the intervention, is believed to be the result of a combination of factors. These factors may include, but are not limited to: 1. The effectiveness of the local anesthetics used. 2. The effects of the analgesic(s) and/or anxiolytic(s) used. 3. The degree of discomfort experienced by the patient at the time of the procedure. 4.  The patients ability and reliability in recalling and recording the events. 5. The presence and  influence of possible secondary gains and/or psychosocial factors. Reported result: Relief experienced during the 1st hour after the procedure: 15 % (Ultra-Short Term Relief)            Interpretative annotation: Clinically appropriate result. Analgesia during this period is likely to be Local Anesthetic and/or IV Sedative (Analgesic/Anxiolytic) related.          Effects of local anesthetic: The analgesic effects attained during this period are directly associated to the localized infiltration of local anesthetics and therefore cary significant diagnostic value as to the etiological location, or anatomical origin, of the pain. Expected duration of relief is directly dependent on the pharmacodynamics of the local anesthetic used. Long-acting (4-6 hours) anesthetics used.  Reported result: Relief during the next 4 to 6 hour after the procedure: 15 % (Short-Term Relief)            Interpretative annotation: Clinically appropriate result. Analgesia during this period is likely to be Local Anesthetic-related.          Long-term benefit: Defined as the period of time past the expected duration of local anesthetics (1 hour for short-acting and 4-6 hours for long-acting). With the possible exception of prolonged sympathetic blockade from the local anesthetics, benefits during this period are typically attributed to, or associated with, other factors such as analgesic sensory neuropraxia, antiinflammatory effects, or beneficial biochemical changes provided by agents other than the local anesthetics.  Reported result: Extended relief following procedure: 60 % (Long-Term Relief)            Interpretative annotation: Clinically appropriate result. Good relief. No permanent benefit expected. Inflammation plays a part in the etiology to the pain.          Current benefits: Defined as reported results that persistent at  this point in time.   Analgesia: 50-75 % Mr. Moudy reports that both, extremity and the axial pain improved with the treatment. Function: Mr. Cretella reports improvement in function ROM: Mr. Romack reports improvement in ROM Interpretative annotation: Ongoing benefit. Therapeutic benefit observed. Effective therapeutic approach.          Interpretation: Results would suggest a successful diagnostic and therapeutic intervention.                  Plan:  Please see "Plan of Care" for details.                Laboratory Chemistry  Inflammation Markers (CRP: Acute Phase) (ESR: Chronic Phase) No results found for: CRP, ESRSEDRATE, LATICACIDVEN                       Rheumatology Markers No results found for: RF, ANA, LABURIC, URICUR, LYMEIGGIGMAB, LYMEABIGMQN              Renal Function Markers Lab Results  Component Value Date   BUN 26 (H) 10/11/2017   CREATININE 1.45 10/11/2017   GFRAA 54 (L) 05/03/2017   GFRNONAA 47 (L) 05/03/2017                 Hepatic Function Markers Lab Results  Component Value Date   AST 21 10/11/2017   ALT 23 10/11/2017   ALBUMIN 4.1 10/11/2017   ALKPHOS 49 10/11/2017                 Electrolytes Lab Results  Component Value Date   NA 139 10/11/2017   K 4.3 10/11/2017   CL 102 10/11/2017   CALCIUM 10.4 10/11/2017   PHOS  3.0 06/26/2017                        Neuropathy Markers No results found for: VITAMINB12, FOLATE, HGBA1C, HIV               Bone Pathology Markers No results found for: VD25OH, WG665LD3TTS, VX7939QZ0, SP2330QT6, 25OHVITD1, 25OHVITD2, 25OHVITD3, TESTOFREE, TESTOSTERONE                       Coagulation Parameters Lab Results  Component Value Date   PLT 272 05/03/2017                 Cardiovascular Markers Lab Results  Component Value Date   HGB 14.4 05/03/2017   HCT 43.3 05/03/2017                 CA Markers No results found for: CEA, CA125, LABCA2               Note: Lab results reviewed.  Recent Diagnostic  Imaging Results  DG C-Arm 1-60 Min-No Report Fluoroscopy was utilized by the requesting physician.  No radiographic  interpretation.   Complexity Note: Imaging results reviewed. Results shared with Mr. Arif, using Layman's terms.                         Meds   Current Outpatient Medications:  .  aspirin 81 MG tablet, Take 81 mg by mouth daily., Disp: , Rfl:  .  diphenhydramine-acetaminophen (TYLENOL PM EXTRA STRENGTH) 25-500 MG TABS tablet, Take 1 tablet by mouth at bedtime as needed. (Patient taking differently: Take 1 tablet by mouth at bedtime as needed (for sleep.). ), Disp: , Rfl:  .  docusate sodium (COLACE) 100 MG capsule, Take 100 mg by mouth 2 (two) times daily., Disp: , Rfl:  .  Eszopiclone 3 MG TABS, Take 3 mg by mouth at bedtime as needed. Take immediately before bedtime , Disp: , Rfl:  .  ezetimibe (ZETIA) 10 MG tablet, Take 1 tablet (10 mg total) by mouth daily., Disp: 90 tablet, Rfl: 1 .  fenofibrate 160 MG tablet, TAKE 1 TABLET BY MOUTH EVERY DAY, Disp: 90 tablet, Rfl: 0 .  finasteride (PROSCAR) 5 MG tablet, Take 1 tablet (5 mg total) by mouth daily., Disp: 90 tablet, Rfl: 3 .  furosemide (LASIX) 40 MG tablet, TAKE 1 TABLET BY MOUTH EVERY DAY. TAKE AN EXTRA TABLET AS NEEDED FOR WEIGHT GAIN OF>3LB/DAY OR 5LB/WEEK, Disp: 135 tablet, Rfl: 1 .  HYDROcodone-acetaminophen (NORCO/VICODIN) 5-325 MG tablet, Take 1 tablet by mouth 3 (three) times daily as needed for moderate pain. For chronic pain To fill on or after: 10/26/17, 11/24/17 Ok to fill 1 day earlier if pharmacy closed on fill date, Disp: 90 tablet, Rfl: 0 .  ketoconazole (NIZORAL) 2 % cream, Apply 1 application topically daily as needed (for dermatitits). , Disp: , Rfl:  .  levothyroxine (SYNTHROID, LEVOTHROID) 200 MCG tablet, Take 1 tablet (200 mcg total) by mouth daily before breakfast., Disp: 90 tablet, Rfl: 3 .  linaclotide (LINZESS) 145 MCG CAPS capsule, Take 1 capsule (145 mcg total) by mouth daily. Take on an empty  stomach at least 30 minutes before 1st meal of the day (Patient taking differently: Take 290 mcg by mouth daily. Take on an empty stomach at least 30 minutes before 1st meal of the day), Disp: 90 capsule, Rfl: 1 .  lisinopril (PRINIVIL,ZESTRIL) 10 MG tablet,  Take 1 tablet (10 mg total) by mouth daily., Disp: 90 tablet, Rfl: 3 .  loratadine (CLARITIN) 10 MG tablet, Take 1 tablet (10 mg total) by mouth daily., Disp: , Rfl:  .  metoprolol succinate (TOPROL-XL) 50 MG 24 hr tablet, Take 25 mg by mouth daily. , Disp: , Rfl:  .  Multiple Vitamin (MULTIVITAMIN) capsule, Take 1 capsule by mouth daily., Disp: , Rfl:  .  multivitamin-lutein (OCUVITE-LUTEIN) CAPS capsule, Take 1 capsule by mouth daily., Disp: , Rfl:  .  nitroGLYCERIN (NITROSTAT) 0.4 MG SL tablet, Place 0.4 mg under the tongue every 5 (five) minutes as needed for chest pain., Disp: , Rfl:  .  omega-3 acid ethyl esters (LOVAZA) 1 g capsule, Take 2 capsules (2 g total) by mouth 2 (two) times daily., Disp: 360 capsule, Rfl: 3 .  potassium chloride (K-DUR,KLOR-CON) 10 MEQ tablet, Take 1 tablet (10 mEq total) by mouth daily., Disp: , Rfl:  .  tamsulosin (FLOMAX) 0.4 MG CAPS capsule, Take 2 capsules (0.8 mg total) by mouth daily after supper., Disp: , Rfl:  .  tiZANidine (ZANAFLEX) 4 MG capsule, Take 1 capsule (4 mg total) by mouth 2 (two) times daily as needed for muscle spasms., Disp: 60 capsule, Rfl: 0 .  naloxegol oxalate (MOVANTIK) 25 MG TABS tablet, Take 1 tablet (25 mg total) by mouth daily. Take on an empty stomach at least 1 hour before or 2 hours after a meal., Disp: 30 tablet, Rfl: 2  ROS  Constitutional: Denies any fever or chills Gastrointestinal: No reported hemesis, hematochezia, vomiting, or acute GI distress Musculoskeletal: Denies any acute onset joint swelling, redness, loss of ROM, or weakness Neurological: No reported episodes of acute onset apraxia, aphasia, dysarthria, agnosia, amnesia, paralysis, loss of coordination, or loss  of consciousness  Allergies  Mr. Schneller is allergic to atorvastatin; gemfibrozil; metformin and related; questran [cholestyramine]; and rosuvastatin.  PFSH  Drug: Mr. Cogdell  reports that he does not use drugs. Alcohol:  reports that he does not drink alcohol. Tobacco:  reports that  has never smoked. he has never used smokeless tobacco. Medical:  has a past medical history of Anosmia (1980s), Atrial flutter ([place]), CAD (coronary artery disease), Chronic constipation, Chronic insomnia, Chronic lower back pain, Diastolic CHF ([place]), History of diverticulitis (2017), History of pneumonia (2013), Hyperlipidemia, Hypertension, Hypothyroidism, Obesity, Class II, BMI 35-39.9, with comorbidity, and OSA on CPAP. Surgical: Mr. Mccaskey  has a past surgical history that includes Percutaneous coronary stent intervention (pci-s) (2013); Replacement total knee bilateral (Bilateral, 2000s); Spinal cord stimulator implant (06/2016); Nasal sinus surgery; A-FLUTTER ABLATION (N/A, 05/07/2017); Radiofrequency ablation (06/2017); Colonoscopy (2007); and Elbow surgery (Right, 2015). Family: family history includes Colon cancer in his mother; Kidney disease in his father; Leukemia in his brother; Stroke in his mother and sister.  Constitutional Exam  General appearance: Well nourished, well developed, and well hydrated. In no apparent acute distress Vitals:   10/18/17 0910  BP: 140/86  Pulse: 69  Resp: 16  Temp: 98.6 F (37 C)  TempSrc: Oral  SpO2: 94%  Weight: 280 lb (127 kg)  Height: 6' (1.829 m)   BMI Assessment: Estimated body mass index is 37.97 kg/m as calculated from the following:   Height as of this encounter: 6' (1.829 m).   Weight as of this encounter: 280 lb (127 kg).  BMI interpretation table: BMI level Category Range association with higher incidence of chronic pain  <18 kg/m2 Underweight   18.5-24.9 kg/m2 Ideal body weight   25-29.9  kg/m2 Overweight Increased incidence by 20%  30-34.9 kg/m2  Obese (Class I) Increased incidence by 68%  35-39.9 kg/m2 Severe obesity (Class II) Increased incidence by 136%  >40 kg/m2 Extreme obesity (Class III) Increased incidence by 254%   BMI Readings from Last 4 Encounters:  10/18/17 37.97 kg/m  10/11/17 38.72 kg/m  09/19/17 36.21 kg/m  09/13/17 36.34 kg/m   Wt Readings from Last 4 Encounters:  10/18/17 280 lb (127 kg)  10/11/17 287 lb 8 oz (130.4 kg)  09/19/17 282 lb (127.9 kg)  09/13/17 283 lb (128.4 kg)  Psych/Mental status: Alert, oriented x 3 (person, place, & time)       Eyes: PERLA Respiratory: No evidence of acute respiratory distress  Cervical Spine Area Exam  Skin & Axial Inspection: No masses, redness, edema, swelling, or associated skin lesions Alignment: Symmetrical Functional ROM: Unrestricted ROM      Stability: No instability detected Muscle Tone/Strength: Functionally intact. No obvious neuro-muscular anomalies detected. Sensory (Neurological): Unimpaired Palpation: No palpable anomalies              Upper Extremity (UE) Exam    Side: Right upper extremity  Side: Left upper extremity  Skin & Extremity Inspection: Skin color, temperature, and hair growth are WNL. No peripheral edema or cyanosis. No masses, redness, swelling, asymmetry, or associated skin lesions. No contractures.  Skin & Extremity Inspection: Skin color, temperature, and hair growth are WNL. No peripheral edema or cyanosis. No masses, redness, swelling, asymmetry, or associated skin lesions. No contractures.  Functional ROM: Unrestricted ROM          Functional ROM: Unrestricted ROM          Muscle Tone/Strength: Functionally intact. No obvious neuro-muscular anomalies detected.  Muscle Tone/Strength: Functionally intact. No obvious neuro-muscular anomalies detected.  Sensory (Neurological): Unimpaired          Sensory (Neurological): Unimpaired          Palpation: No palpable anomalies              Palpation: No palpable anomalies               Specialized Test(s): Deferred         Specialized Test(s): Deferred          Thoracic Spine Area Exam  Skin & Axial Inspection: No masses, redness, or swelling Alignment: Symmetrical Functional ROM: Unrestricted ROM Stability: No instability detected Muscle Tone/Strength: Functionally intact. No obvious neuro-muscular anomalies detected. Sensory (Neurological): Unimpaired Muscle strength & Tone: No palpable anomalies   Lumbar Spine Area Exam  Skin & Axial Inspection: No masses, redness, or swelling Alignment: Symmetrical Functional ROM: Unrestricted ROM      Stability: No instability detected Muscle Tone/Strength: Functionally intact. No obvious neuro-muscular anomalies detected. Sensory (Neurological): Unimpaired Palpation: No palpable anomalies       Provocative Tests: Lumbar Hyperextension and rotation test: Improved after treatment       Lumbar Lateral bending test: Improved after treatment       Patrick's Maneuver: evaluation deferred today                    Gait & Posture Assessment  Ambulation: Unassisted Gait: Relatively normal for age and body habitus Posture: WNL   Lower Extremity Exam    Side: Right lower extremity  Side: Left lower extremity  Skin & Extremity Inspection: Skin color, temperature, and hair growth are WNL. No peripheral edema or cyanosis. No masses, redness, swelling, asymmetry, or associated skin lesions. No  contractures.  Skin & Extremity Inspection: Skin color, temperature, and hair growth are WNL. No peripheral edema or cyanosis. No masses, redness, swelling, asymmetry, or associated skin lesions. No contractures.  Functional ROM: Unrestricted ROM          Functional ROM: Unrestricted ROM          Muscle Tone/Strength: Functionally intact. No obvious neuro-muscular anomalies detected.  Muscle Tone/Strength: Functionally intact. No obvious neuro-muscular anomalies detected.  Sensory (Neurological): Unimpaired  Sensory (Neurological):  Unimpaired  Palpation: No palpable anomalies  Palpation: No palpable anomalies     Assessment  Primary Diagnosis & Pertinent Problem List: The primary encounter diagnosis was Spondylosis without myelopathy or radiculopathy, lumbar region. Diagnoses of Lumbar facet arthropathy, Chronic left-sided low back pain without sciatica, Lumbar degenerative disc disease, and Spinal cord stimulator status were also pertinent to this visit.  Status Diagnosis  Improved Improving Improving 1. Spondylosis without myelopathy or radiculopathy, lumbar region   2. Lumbar facet arthropathy   3. Chronic left-sided low back pain without sciatica   4. Lumbar degenerative disc disease   5. Spinal cord stimulator status      82 year old male with a history of lumbar spondylosis, lumbar facet arthropathy most pronounced on the left status post left L3/4, L4/5, L5/S1 facet diagnostic nerve block on 06/13/2017, 06/27/17 and subsequent radiofrequency ablation on July 09, 2017 and on 09/19/17  who presents for follow-up.  Patient notes approximately 40% improvement in his buttock and thigh symptoms in approximately 20-30% improvement in his axial low back symptoms.  Patient states that he continues to have intermittent episodes of severe low back pain especially when he stands up.  Patient states that the intensity of his pain during these painful episodes is not is high as it was prior to the block and that the frequency of these painful episodes is less than before as well.  Overall this would suggest a positive therapeutic intervention and we could consider repeating lumbar radiofrequency ablation in the future should his symptoms recur.   We also evaluate the patient spinal cord stimulator today.  X-ray was obtained to confirm lead positioning and rule out lead fracture or migration.  The patient's right SCS lead has high impedance levels and is not functioning.  SCS representative was here today to programmed the  left side so that the patient could obtain benefit from his SCS.  I will also give the patient IM Norflex and Toradol today.  We will refill his hydrocodone 5 mg 3 times daily as needed, quantity 61-monthfor 3 months.  Plan: -Prescription for hydrocodone as below.  Prescription for 2 months  -Status post optimization of LEFT LEAD SCS programming with SCS representative. xrays obtained, no evidence of lead fracture/migration -IM norflex and toradol as below  -[facility], info provided -Follow-up in 2 months.  Plan of Care  Pharmacotherapy (Medications Ordered): Meds ordered this encounter  Medications  . orphenadrine (NORFLEX) injection 30 mg  . ketorolac (TORADOL) 30 MG/ML injection 30 mg  . DISCONTD: HYDROcodone-acetaminophen (NORCO/VICODIN) 5-325 MG tablet    Sig: Take 1 tablet by mouth 3 (three) times daily as needed for moderate pain. For chronic pain To fill on or after: 10/26/17, 11/24/17 Ok to fill 1 day earlier if pharmacy closed on fill date    Dispense:  90 tablet    Refill:  0  . HYDROcodone-acetaminophen (NORCO/VICODIN) 5-325 MG tablet    Sig: Take 1 tablet by mouth 3 (three) times daily as  needed for moderate pain. For chronic pain To fill on or after: 10/26/17, 11/24/17 Ok to fill 1 day earlier if pharmacy closed on fill date    Dispense:  90 tablet    Refill:  0   Lab-work, procedure(s), and/or referral(s): Orders Placed This Encounter  Procedures  . DG C-Arm 1-60 Min-No Report    Provider-requested follow-up: Return in about 10 weeks (around 12/27/2017).   Time Note: Greater than 50% of the 25 minute(s) of face-to-face time spent with Mr. Battaglini, was spent in counseling/coordination of care regarding: the treatment plan, treatment alternatives, medication side effects, the opioid analgesic risks and possible complications, the results, interpretation and significance of  his recent diagnostic interventional treatment(s), the  appropriate use of his medications, realistic expectations, the goals of pain management (increased in functionality), the need to bring and keep the BMI below 30 and the medication agreement.  Future Appointments  Date Time Provider [place]  10/18/2017 11:30 AM Ria Bush, MD [facility]  12/20/2017  9:30 AM Deboraha Sprang, MD [facility]  12/27/2017  8:45 AM Gillis Santa, MD [facility] None    Primary Care Physician: Ria Bush, MD Location: [facility] Note by: Gillis Santa, M.D Date: 10/18/2017; Time: 11:25 AM  Patient Instructions  1. Refill for Hydrocodone 2. Rx for Movantik 3. IM Toradol/IM Norflex today 4. [place] at [facility] [employer]

## 2017-10-18 NOTE — Patient Instructions (Signed)
1. Refill for Hydrocodone 2. Rx for Movantik 3. IM Toradol/IM Norflex today 4. [place] at [employer]

## 2017-10-18 NOTE — Patient Instructions (Addendum)
Look into medical [facility] management [facility] [phone], let me know if you are interested and need referral.  If interested, check with pharmacy about new 2 shot shingles series (shingrix).  We will refer you to skin doctor.  Return as needed or in 3 months for follow up visit.   Constipation, Adult Constipation is when a person has fewer bowel movements in a week than normal, has difficulty having a bowel movement, or has stools that are dry, hard, or larger than normal. Constipation may be caused by an underlying condition. It may become worse with age if a person takes certain medicines and does not take in enough fluids. Follow these instructions at home: Eating and drinking   Eat foods that have a lot of fiber, such as fresh fruits and vegetables, whole grains, and beans.  Limit foods that are high in fat, low in fiber, or overly processed, such as french fries, hamburgers, cookies, candies, and soda.  Drink enough fluid to keep your urine clear or pale yellow. General instructions  Exercise regularly or as told by your health care provider.  Go to the restroom when you have the urge to go. Do not hold it in.  Take over-the-counter and prescription medicines only as told by your health care provider. These include any fiber supplements.  Practice pelvic floor retraining exercises, such as deep breathing while relaxing the lower abdomen and pelvic floor relaxation during bowel movements.  Watch your condition for any changes.  Keep all follow-up visits as told by your health care provider. This is important. Contact a health care provider if:  You have pain that gets worse.  You have a fever.  You do not have a bowel movement after 4 days.  You vomit.  You are not hungry.  You lose weight.  You are bleeding from the anus.  You have thin, pencil-like stools. Get help right away if:  You have a fever and your symptoms suddenly get  worse.  You leak stool or have blood in your stool.  Your abdomen is bloated.  You have severe pain in your abdomen.  You feel dizzy or you faint. This information is not intended to replace advice given to you by your health care provider. Make sure you discuss any questions you have with your health care provider. Document Released: 05/12/2004 Document Revised: 03/03/2016 Document Reviewed: 02/02/2016 Elsevier Interactive Patient Education  2018 [employer].

## 2017-10-18 NOTE — Assessment & Plan Note (Signed)
This was treated successfully with cardioversion 04/2017. Has been able to taper off eliquis.

## 2017-10-18 NOTE — Assessment & Plan Note (Signed)
Bump in Cr noted. Encouraged good hydration status.

## 2017-10-18 NOTE — Progress Notes (Signed)
BP 124/78 (BP Location: Left Arm, Patient Position: Sitting, Cuff Size: Large)   Pulse 61   Temp 98.2 F (36.8 C) (Oral)   Ht 6' (1.829 m)   Wt 284 lb (128.8 kg)   SpO2 96%   BMI 38.52 kg/m    CC: [name] f/u visit Subjective:    Patient ID: Anthony [name], male    DOB: April 05, 1936, 82 y.o.   MRN: [id number]  HPI: Anthony [name] is a 82 y.o. male presenting on 10/18/2017 for Medicare Wellness (Part 2)   Saw [name] last week for medicare wellness visit. Note reviewed.  BP well controlled today. Denies dizziness or hypotensive symptoms.  Ongoing chronic lumbar pain followed by pain management Dr Holley Raring. Has had 2 recent lumbar nerve ablations.  OSA on CPAP. Looking locally for DME.  Obesity - has gone to [facility]. Looking into options.  Chronic constipation - on linzess PRN - requests refill. Also takes metamucil, senna, colace, alli PRN. PRN movantik with hydrocodone use. Med list updated to how he is taking his bowel regimen. Passing gas. No vomiting or unexpected weight loss.   Preventative: Colon cancer screening - normal colonoscopy 2007. Denies bowel changes or blood in stool. No known fmhx colon cancer.  Prostate cancer screening - aged out  Lung cancer screening - not eligible  Flu shot -yearly Td 2013 Pneumovax 2012, 2013, prevnar 2015 zostavax 2015 shingrix - discussed Advanced directive discussion - will need to review.  Seat belt use discussed - does not use  Sunscreen use discussed, no changing moles on skin  Non smoker Alcohol - none  Widower (wife passed 08/2014) Lives alone, GF Miss [name] sometimes (she was nurse) Son is [name] (FP at [employer] [facility] [employer] [facility]) Occ: retired, used to [employer] store Edu: HS Activity: planning on joining local club Diet: good water, fruits/vegetables daily  Relevant past medical, surgical, family and social history reviewed and updated as indicated. Interim medical history since our last visit  reviewed. Allergies and medications reviewed and updated. Outpatient Medications Prior to Visit  Medication Sig Dispense Refill  . aspirin 81 MG tablet Take 81 mg by mouth daily.    . diphenhydramine-acetaminophen (TYLENOL PM EXTRA STRENGTH) 25-500 MG TABS tablet Take 1 tablet by mouth at bedtime as needed. (Patient taking differently: Take 1 tablet by mouth at bedtime as needed (for sleep.). )    . docusate sodium (COLACE) 100 MG capsule Take 100 mg by mouth 2 (two) times daily.    . Eszopiclone 3 MG TABS Take 3 mg by mouth at bedtime as needed. Take immediately before bedtime     . ezetimibe (ZETIA) 10 MG tablet Take 1 tablet (10 mg total) by mouth daily. 90 tablet 1  . fenofibrate 160 MG tablet TAKE 1 TABLET BY MOUTH EVERY DAY 90 tablet 0  . finasteride (PROSCAR) 5 MG tablet Take 1 tablet (5 mg total) by mouth daily. 90 tablet 3  . furosemide (LASIX) 40 MG tablet TAKE 1 TABLET BY MOUTH EVERY DAY. TAKE AN EXTRA TABLET AS NEEDED FOR WEIGHT GAIN OF>3LB/DAY OR 5LB/WEEK 135 tablet 1  . HYDROcodone-acetaminophen (NORCO/VICODIN) 5-325 MG tablet Take 1 tablet by mouth 3 (three) times daily as needed for moderate pain. For chronic pain To fill on or after: 10/26/17, 11/24/17 Ok to fill 1 day earlier if pharmacy closed on fill date 90 tablet 0  . ketoconazole (NIZORAL) 2 % cream Apply 1 application topically daily as needed (for dermatitits).     Marland Kitchen  levothyroxine (SYNTHROID, LEVOTHROID) 200 MCG tablet Take 1 tablet (200 mcg total) by mouth daily before breakfast. 90 tablet 3  . lisinopril (PRINIVIL,ZESTRIL) 10 MG tablet Take 1 tablet (10 mg total) by mouth daily. 90 tablet 3  . loratadine (CLARITIN) 10 MG tablet Take 1 tablet (10 mg total) by mouth daily.    . metoprolol succinate (TOPROL-XL) 50 MG 24 hr tablet Take 25 mg by mouth daily.     . Multiple Vitamin (MULTIVITAMIN) capsule Take 1 capsule by mouth daily.    . multivitamin-lutein (OCUVITE-LUTEIN) CAPS capsule Take 1 capsule by mouth daily.    .  naloxegol oxalate (MOVANTIK) 25 MG TABS tablet Take 1 tablet (25 mg total) by mouth daily. Take on an empty stomach at least 1 hour before or 2 hours after a meal. 30 tablet 2  . nitroGLYCERIN (NITROSTAT) 0.4 MG SL tablet Place 0.4 mg under the tongue every 5 (five) minutes as needed for chest pain.    Marland Kitchen omega-3 acid ethyl esters (LOVAZA) 1 g capsule Take 2 capsules (2 g total) by mouth 2 (two) times daily. 360 capsule 3  . potassium chloride (K-DUR,KLOR-CON) 10 MEQ tablet Take 1 tablet (10 mEq total) by mouth daily.    . tamsulosin (FLOMAX) 0.4 MG CAPS capsule Take 2 capsules (0.8 mg total) by mouth daily after supper.    Marland Kitchen tiZANidine (ZANAFLEX) 4 MG capsule Take 1 capsule (4 mg total) by mouth 2 (two) times daily as needed for muscle spasms. 60 capsule 0  . linaclotide (LINZESS) 145 MCG CAPS capsule Take 1 capsule (145 mcg total) by mouth daily. Take on an empty stomach at least 30 minutes before 1st meal of the day (Patient taking differently: Take 290 mcg by mouth daily. Take on an empty stomach at least 30 minutes before 1st meal of the day) 90 capsule 1   No facility-administered medications prior to visit.      Per HPI unless specifically indicated in ROS section below Review of Systems     Objective:    BP 124/78 (BP Location: Left Arm, Patient Position: Sitting, Cuff Size: Large)   Pulse 61   Temp 98.2 F (36.8 C) (Oral)   Ht 6' (1.829 m)   Wt 284 lb (128.8 kg)   SpO2 96%   BMI 38.52 kg/m   Wt Readings from Last 3 Encounters:  10/18/17 284 lb (128.8 kg)  10/18/17 280 lb (127 kg)  10/11/17 287 lb 8 oz (130.4 kg)    Physical Exam  Constitutional: He is oriented to person, place, and time. He appears well-developed and well-nourished. No distress.  Walks without assistive ambulatory device Needs assistance to get on exam table  HENT:  Head: Normocephalic and atraumatic.  Right Ear: Hearing, tympanic membrane, external ear and ear canal normal.  Left Ear: Hearing, tympanic  membrane, external ear and ear canal normal.  Nose: Nose normal.  Mouth/Throat: Uvula is midline, oropharynx is clear and moist and mucous membranes are normal. No oropharyngeal exudate, posterior oropharyngeal edema or posterior oropharyngeal erythema.  Eyes: Conjunctivae and EOM are normal. Pupils are equal, round, and reactive to light. No scleral icterus.  Neck: Normal range of motion. Neck supple. No thyromegaly present.  Cardiovascular: Normal rate, regular rhythm, normal heart sounds and intact distal pulses.  No murmur heard. Pulses:      Radial pulses are 2+ on the right side, and 2+ on the left side.  Pulmonary/Chest: Effort normal and breath sounds normal. No respiratory distress. He has  no wheezes. He has no rales.  Abdominal: Soft. Bowel sounds are normal. He exhibits no distension and no mass. There is no tenderness. There is no rebound and no guarding.  Musculoskeletal: Normal range of motion. He exhibits no edema.  Lymphadenopathy:    He has no cervical adenopathy.  Neurological: He is alert and oriented to person, place, and time.  CN grossly intact, station and gait intact  Skin: Skin is warm and dry. No rash noted.  Psychiatric: He has a normal mood and affect. His behavior is normal. Judgment and thought content normal.  Nursing note and vitals reviewed.  Results for orders placed or performed in visit on 10/11/17  Comprehensive metabolic panel  Result Value Ref Range   Sodium 139 135 - 145 mEq/L   Potassium 4.3 3.5 - 5.1 mEq/L   Chloride 102 96 - 112 mEq/L   CO2 31 19 - 32 mEq/L   Glucose, Bld 113 (H) 70 - 99 mg/dL   BUN 26 (H) 6 - 23 mg/dL   Creatinine, Ser 1.45 0.40 - 1.50 mg/dL   Total Bilirubin 0.6 0.2 - 1.2 mg/dL   Alkaline Phosphatase 49 39 - 117 U/L   AST 21 0 - 37 U/L   ALT 23 0 - 53 U/L   Total Protein 7.5 6.0 - 8.3 g/dL   Albumin 4.1 3.5 - 5.2 g/dL   Calcium 10.4 8.4 - 10.5 mg/dL   GFR 49.77 (L) >60.00 mL/min  TSH  Result Value Ref Range   TSH  0.51 0.35 - 4.50 uIU/mL  Lipid panel  Result Value Ref Range   Cholesterol 199 0 - 200 mg/dL   Triglycerides 253.0 (H) 0.0 - 149.0 mg/dL   HDL 41.30 >39.00 mg/dL   VLDL 50.6 (H) 0.0 - 40.0 mg/dL   Total CHOL/HDL Ratio 5    NonHDL 157.82   LDL cholesterol, direct  Result Value Ref Range   Direct LDL 108.0 mg/dL      Assessment & Plan:  Requests derm referral for h/o multiple cancers in the past.  Problem List Items Addressed This Visit    (HFpEF) heart failure with preserved ejection fraction (HCC)    Seems euvolemic. Chronic, stable on lasix 40mg  and potassium daily.      CAD (coronary artery disease)   Chronic constipation - Primary    Majority of visit spent discussing bowel regimen. It seems he has both slow transit constipation as well as some narcotic induced constipation and possibly other. Previously managed on linzess but it sounds like he was only taking intermittently with good but limited effect. Reviewed bowel regimen as well as strategy to help manage this. Reviewed importance of regular fiber and water intake in diet.  Will restart linzess 221mcg daily (had run out). Will add further to bowel regimen as needed (metamucil soluble fiber supplement, miralax powder, sennokot, docusate, etc). He has also been taking Alli OTC PRN constipation - suggested he stop at this time. I did ask him to keep diary with bowel movements and bowel regimen used each day. RTC 3 mo f/u. No red flags noted today.       Relevant Medications   Psyllium (METAMUCIL FIBER) 51.7 % PACK   senna (SENOKOT) 8.6 MG TABS tablet   polyethylene glycol powder (GLYCOLAX/MIRALAX) powder   Chronic lower back pain    Followed by pain clinic.       CKD (chronic kidney disease) stage 3, GFR 30-59 ml/min (HCC)    Bump in Cr noted.  Encouraged good hydration status.      History of basal cell carcinoma (BCC)   Relevant Orders   Ambulatory referral to Dermatology   History of squamous cell carcinoma in situ  of skin   Relevant Orders   Ambulatory referral to Dermatology   Hypercholesterolemia with hypertriglyceridemia    Chronic, stable on zetia and fenofibrate. Intolerant to statins. Reviewed FLP - with high triglycerides, reviewed healthy diet chances to affect improved triglyceride levels.       Hypertension    Chronic, stable. Continue current regimen.       Hypothyroidism    Chronic, stable. Continue levothyroxine 261mcg daily.       OSA on CPAP    Endorses compliance with CPAP machine. Looking into local durable medical equipment company to order machine and supplies.       Severe obesity (BMI 35.0-39.9) with comorbidity ([place])    Planning on joining local club. He is also interested in expensive residential weight loss center through [facility]. I provided with info for local [facility] weight management clinic offered by Dr Leafy Ro.      Relevant Medications   orlistat (ALLI) 60 MG capsule   Therapeutic opioid-induced constipation (OIC)    Has movantik for constipation related to hydrocodone, taken PRN          Meds ordered this encounter  Medications  . linaclotide (LINZESS) 290 MCG CAPS capsule    Sig: Take 1 capsule (290 mcg total) by mouth daily. Take on an empty stomach at least 30 minutes before 1st meal of the day    Dispense:  90 capsule    Refill:  3  . orlistat (ALLI) 60 MG capsule    Sig: Take 1 capsule (60 mg total) by mouth daily as needed (diarrhea).  . Psyllium (METAMUCIL FIBER) 51.7 % PACK    Sig: Take 5-10 mLs by mouth daily.  Marland Kitchen senna (SENOKOT) 8.6 MG TABS tablet    Sig: Take 3 tablets (25.8 mg total) by mouth daily as needed for moderate constipation.    Refill:  0  . polyethylene glycol powder (GLYCOLAX/MIRALAX) powder    Sig: Take 17 g by mouth 2 (two) times daily as needed.    Dispense:  3350 g    Refill:  1   Orders Placed This Encounter  Procedures  . Ambulatory referral to Dermatology    Referral Priority:   Routine    Referral Type:    Consultation    Referral Reason:   Specialty Services Required    Requested Specialty:   Dermatology    Number of Visits Requested:   1    Follow up plan: Return in about 3 months (around 01/15/2018) for follow up visit.  Ria Bush, MD

## 2017-10-18 NOTE — Assessment & Plan Note (Signed)
Followed by pain clinic.  

## 2017-10-27 NOTE — Progress Notes (Signed)
I reviewed health advisor's note, was available for consultation, and agree with documentation and plan.  

## 2017-11-04 ENCOUNTER — Other Ambulatory Visit: Payer: Self-pay | Admitting: Family Medicine

## 2017-11-15 ENCOUNTER — Other Ambulatory Visit: Payer: Self-pay | Admitting: Family Medicine

## 2017-11-16 MED ORDER — TAMSULOSIN HCL 0.4 MG PO CAPS: 0.8000 mg | ORAL_CAPSULE | Freq: Every day | ORAL | 1 refills | Status: DC

## 2017-11-19 DIAGNOSIS — L219 Seborrheic dermatitis, unspecified: Secondary | ICD-10-CM | POA: Diagnosis not present

## 2017-11-19 DIAGNOSIS — Z1283 Encounter for screening for malignant neoplasm of skin: Secondary | ICD-10-CM | POA: Diagnosis not present

## 2017-11-19 DIAGNOSIS — L578 Other skin changes due to chronic exposure to nonionizing radiation: Secondary | ICD-10-CM | POA: Diagnosis not present

## 2017-11-19 DIAGNOSIS — D485 Neoplasm of uncertain behavior of skin: Secondary | ICD-10-CM | POA: Diagnosis not present

## 2017-11-19 DIAGNOSIS — L570 Actinic keratosis: Secondary | ICD-10-CM | POA: Diagnosis not present

## 2017-11-19 DIAGNOSIS — Z85828 Personal history of other malignant neoplasm of skin: Secondary | ICD-10-CM | POA: Diagnosis not present

## 2017-11-26 HISTORY — PX: CARDIOVASCULAR STRESS TEST: SHX262

## 2017-12-01 ENCOUNTER — Encounter: Payer: Self-pay | Admitting: Family Medicine

## 2017-12-01 MED ORDER — NITROGLYCERIN 0.4 MG SL SUBL: 0.4000 mg | SUBLINGUAL_TABLET | SUBLINGUAL | 0 refills | Status: AC | PRN

## 2017-12-20 ENCOUNTER — Encounter: Payer: Self-pay | Admitting: Internal Medicine

## 2017-12-20 ENCOUNTER — Ambulatory Visit (INDEPENDENT_AMBULATORY_CARE_PROVIDER_SITE_OTHER): Payer: Medicare Other | Admitting: Internal Medicine

## 2017-12-20 VITALS — BP 122/80 | HR 62 | Ht 73.0 in | Wt 281.2 lb

## 2017-12-20 DIAGNOSIS — R079 Chest pain, unspecified: Secondary | ICD-10-CM

## 2017-12-20 DIAGNOSIS — I4892 Unspecified atrial flutter: Secondary | ICD-10-CM | POA: Diagnosis not present

## 2017-12-20 DIAGNOSIS — I2510 Atherosclerotic heart disease of native coronary artery without angina pectoris: Secondary | ICD-10-CM | POA: Diagnosis not present

## 2017-12-20 NOTE — Patient Instructions (Addendum)
Medication Instructions: - Your physician has recommended you make the following change in your medication:  1) STOP metoprolol  Labwork: - none ordered  Procedures/Testing: - Your physician has requested that you have an exercise stress myoview.  Anthony [name]  Your caregiver has ordered a Stress Test with nuclear imaging. The purpose of this test is to evaluate the blood supply to your heart muscle. This procedure is referred to as a "Non-Invasive Stress Test." This is because other than having an IV started in your vein, nothing is inserted or "invades" your body. Cardiac stress tests are done to find areas of poor blood flow to the heart by determining the extent of coronary artery disease (CAD). Some patients exercise on a treadmill, which naturally increases the blood flow to your heart, while others who are  unable to walk on a treadmill due to physical limitations have a pharmacologic/chemical stress agent called Lexiscan . This medicine will mimic walking on a treadmill by temporarily increasing your coronary blood flow.   Please note: these test may take anywhere between 2-4 hours to complete  PLEASE REPORT TO [facility] AT THE FIRST DESK WILL DIRECT YOU WHERE TO GO  Date of Procedure:_____________________________________  Arrival Time for Procedure:______________________________  Instructions regarding medication:   __x__:  Hold other medications as follows: lasix (furosemide) the morning of your test  PLEASE NOTIFY THE OFFICE AT LEAST 24 HOURS IN ADVANCE IF YOU ARE UNABLE TO KEEP YOUR APPOINTMENT.  [phone] AND  PLEASE NOTIFY NUCLEAR MEDICINE AT [facility] AT LEAST 24 HOURS IN ADVANCE IF YOU ARE UNABLE TO KEEP YOUR APPOINTMENT. [phone]  How to prepare for your Myoview test:  1. Do not eat or drink after midnight 2. No caffeine for 24 hours prior to test 3. No smoking 24 hours prior to test. 4. Your medication may be taken with water.   If your doctor stopped a medication because of this test, do not take that medication. 5. Ladies, please do not wear dresses.  Skirts or pants are appropriate. Please wear a short sleeve shirt. 6. No perfume, cologne or lotion. 7. Wear comfortable walking shoes. No heels!   Follow-Up: - Your physician wants you to follow-up in: 6 months with Dr. Caryl Comes. You will receive a reminder letter in the mail two months in advance. If you don't receive a letter, please call our office to schedule the follow-up appointment.   Any Additional Special Instructions Will Be Listed Below (If Applicable).     If you need a refill on your cardiac medications before your next appointment, please call your pharmacy.

## 2017-12-20 NOTE — Progress Notes (Signed)
ELECTROPHYSIOLOGY Office NOTE  Patient ID: Anthony [name], MRN: [id number], DOB/AGE: 1936-04-09 82 y.o. Admit date: (Not on file) Date of Consult: 12/20/2017  Primary Physician: Ria Bush, MD Primary Cardiologist: [facility]     Anthony [name] is a 82 y.o. male who is being seen today for the evaluation of Atrial flutter at the request of his son Lamar Meter.    HPI Anthony [name] is a 82 y.o. male is the  father of [name] who is recently moved here from [place].  He has been followed by [facility]. He has a history of coronary artery disease with prior stenting of his LAD and his RCA in 2013.  He does not recall symptoms prior to the intervention; he remembers having had an ultrasound.  He is seeing following catheter ablation of atrial flutter 9/18.  No recurrent palpitations.  He has chronic stable shortness of breath but over the last month has noted episodes of a chest discomfort precipitated by exertion and relieved by rest after a few minutes.  It is not radiating.  It does not seem to aggravate shortness of breath.   Echocardiogram 6/18 demonstrated normal LV function and normal left atrial size  Thromboembolic risk factors ( age -11, HTN-1 , Vasc disease -1) for a CHADSVASc Score of 4    Date Cr K Hgb  9/18   14.4  2/19 1.45 4.3           His other major issue remains insomnia   Past Medical History:  Diagnosis Date  . Anosmia 1980s  . Atrial flutter ([place])   . CAD (coronary artery disease)   . Chronic constipation   . Chronic insomnia   . Chronic lower back pain    s/p spine stimulator placement  . Diastolic CHF ([place])   . History of diverticulitis 2017  . History of pneumonia 2013  . Hyperlipidemia   . Hypertension   . Hypothyroidism   . Obesity, Class II, BMI 35-39.9, with comorbidity   . OSA on CPAP       Surgical History:  Past Surgical History:  Procedure Laterality Date  . A-FLUTTER ABLATION N/A 05/07/2017   Procedure:  A-Flutter Ablation;  Surgeon: Deboraha Sprang, MD;  Location: [facility];  Service: Cardiovascular;  Laterality: N/A;  . COLONOSCOPY  2007   normal per prior PCP records, rpt 10 yrs (Dr Osie Cheeks)  . ELBOW SURGERY Right 2015   ulnar nerve decompression  . NASAL SINUS SURGERY     nasal polyps  . PERCUTANEOUS CORONARY STENT INTERVENTION (PCI-S)  2013   EF55%, 70% mid LAD, 99% mid RCA, mild MR, elev LVEDP, DES to mid LAD. RCA is nondominant  . RADIOFREQUENCY ABLATION  06/2017   lumbar region [facility])  . REPLACEMENT TOTAL KNEE BILATERAL Bilateral 2000s  . SPINAL CORD STIMULATOR IMPLANT  06/2016     Home Meds: Prior to Admission medications   Medication Sig Start Date End Date Taking? Authorizing Provider  apixaban (ELIQUIS) 5 MG TABS tablet Take 5 mg by mouth 2 (two) times daily.    [provider]  aspirin 81 MG EC tablet Take 81 mg by mouth daily. Swallow whole.    [provider]  diphenhydramine-acetaminophen (TYLENOL PM EXTRA STRENGTH) 25-500 MG TABS tablet Take 1 tablet by mouth at bedtime as needed. 04/16/17   Ria Bush, MD  docusate sodium (COLACE) 100 MG capsule Take 100 mg by mouth 2 (two)  times daily.    [provider]  Eszopiclone 3 MG TABS Take 1 mg by mouth at bedtime as needed. Take immediately before bedtime    [provider]  ezetimibe (ZETIA) 10 MG tablet Take 1 tablet (10 mg total) by mouth daily. 04/16/17   Ria Bush, MD  fenofibrate 160 MG tablet Take 1 tablet (160 mg total) by mouth daily. 04/16/17   Ria Bush, MD  finasteride (PROSCAR) 5 MG tablet Take 5 mg by mouth daily.    [provider]  furosemide (LASIX) 40 MG tablet Take 1 tablet (40 mg total) by mouth daily. Take extra tablet as needed for weight gain >3lb/day or 5lb/wk 04/16/17   Ria Bush, MD  HYDROcodone-acetaminophen (NORCO/VICODIN) 5-325 MG tablet Take 1 tablet by mouth every 6 (six) hours as needed for moderate pain.     [provider]  ketoconazole (NIZORAL) 2 % cream Apply 1 application topically daily.    [provider]  levothyroxine (SYNTHROID, LEVOTHROID) 200 MCG tablet Take 1 tablet (200 mcg total) by mouth daily before breakfast. 04/16/17   Ria Bush, MD  linaclotide [facility]) 145 MCG CAPS capsule Take 1 capsule (145 mcg total) by mouth daily. Take on an empty stomach at least 30 minutes before 1st meal of the day Patient not taking: Reported on 04/24/2017 04/16/17   Ria Bush, MD  lisinopril (PRINIVIL,ZESTRIL) 2.5 MG tablet Take 2.5 mg by mouth daily.    [provider]  metoprolol succinate (TOPROL-XL) 50 MG 24 hr tablet Take 1 tablet (50 mg total) by mouth 2 (two) times daily. 04/16/17   Ria Bush, MD  montelukast (SINGULAIR) 10 MG tablet Take 10 mg by mouth at bedtime.    [provider]  Multiple Vitamin (MULTIVITAMIN) capsule Take 1 capsule by mouth daily.    [provider]  multivitamin-lutein (OCUVITE-LUTEIN) CAPS capsule Take 1 capsule by mouth daily.    [provider]  nitroGLYCERIN (NITROSTAT) 0.4 MG SL tablet Place 0.4 mg under the tongue every 5 (five) minutes as needed for chest pain.    [provider]  omega-3 acid ethyl esters (LOVAZA) 1 g capsule Take 2 g by mouth 2 (two) times daily.    [provider]  polyethylene glycol (MIRALAX / GLYCOLAX) packet Take 17 g by mouth daily as needed for moderate constipation. Mix with water, juice, coffee or tea 04/16/17   Ria Bush, MD  potassium chloride (K-DUR,KLOR-CON) 10 MEQ tablet Take 10 mEq by mouth 2 (two) times daily.    [provider]  tamsulosin (FLOMAX) 0.4 MG CAPS capsule Take 2 capsules (0.8 mg total) by mouth daily after supper. 04/16/17   Ria Bush, MD  tiZANidine (ZANAFLEX) 4 MG capsule Take 1 capsule (4 mg total) by mouth 2 (two) times daily as needed for muscle spasms. 04/24/17   Gillis Santa, MD  zaleplon (SONATA) 10  MG capsule Take 10 mg by mouth at bedtime as needed for sleep.    [provider]    Allergies:  Allergies  Allergen Reactions  . Atorvastatin     Muscle pain  . Gemfibrozil Other (See Comments)    Muscle pain.  . Metformin And Related     Dizziness  . Questran [Cholestyramine]   . Rosuvastatin     Muscle pain    Social History   Socioeconomic History  . Marital status: Widowed    Spouse name: Not on file  . Number of children: Not on file  . Years  of education: Not on file  . Highest education level: Not on file  Occupational History  . Not on file  Social Needs  . Financial resource strain: Not on file  . Food insecurity:    Worry: Not on file    Inability: Not on file  . Transportation needs:    Medical: Not on file    Non-medical: Not on file  Tobacco Use  . Smoking status: Never Smoker  . Smokeless tobacco: Never Used  Substance and Sexual Activity  . Alcohol use: No  . Drug use: No  . Sexual activity: Never  Lifestyle  . Physical activity:    Days per week: Not on file    Minutes per session: Not on file  . Stress: Not on file  Relationships  . Social connections:    Talks on phone: Not on file    Gets together: Not on file    Attends religious service: Not on file    Active member of club or organization: Not on file    Attends meetings of clubs or organizations: Not on file    Relationship status: Not on file  . Intimate partner violence:    Fear of current or ex partner: Not on file    Emotionally abused: Not on file    Physically abused: Not on file    Forced sexual activity: Not on file  Other Topics Concern  . Not on file  Social History Narrative   Widower (wife passed 08/2014).    Lives alone, GF Miss [name] sometimes (she was nurse)   Son is [name] (FP at [facility] [facility])   Occ: retired, used to [employer] store   Edu: HS     Family History  Problem Relation Age of Onset  . Stroke Mother   . Kidney  disease Father   . Leukemia Brother   . Stroke Sister   . Diabetes Neg Hx      ROS:  Please see the history of present illness.     All other systems reviewed and negative.    Physical Exam: Blood pressure 122/80, pulse 62, height 6\' 1"  (1.854 m), weight 281 lb 4 oz (127.6 kg). Well developed and nourished in no acute distress HENT normal Neck supple with JVP-flat Clear Regular rate and rhythm, no murmurs or gallops Abd-soft with active BS No Clubbing cyanosis edema Skin-warm and dry A & Oriented  Grossly normal sensory and motor function     Labs: Cardiac Enzymes No results for input(s): CKTOTAL, CKMB, TROPONINI in the last 72 hours. CBC Lab Results  Component Value Date   WBC 7.2 05/03/2017   HGB 14.4 05/03/2017   HCT 43.3 05/03/2017   MCV 93 05/03/2017   PLT 272 05/03/2017   PROTIME: No results for input(s): LABPROT, INR in the last 72 hours. Chemistry No results for input(s): NA, K, CL, CO2, BUN, CREATININE, CALCIUM, PROT, BILITOT, ALKPHOS, ALT, AST, GLUCOSE in the last 168 hours.  Invalid input(s): LABALBU Lipids Lab Results  Component Value Date   CHOL 199 10/11/2017   HDL 41.30 10/11/2017   TRIG 253.0 (H) 10/11/2017   BNP No results found for: PROBNP Thyroid Function Tests: No results for input(s): TSH, T4TOTAL, T3FREE, THYROIDAB in the last 72 hours.  Invalid input(s): FREET3 Miscellaneous No results found for: DDIMER  Radiology/Studies:  No results found.  EKG: Sinus at 62 Intervals 18/11/39 Axis left -42 No ST-T changes  Assessment and Plan:  Atrial flutter-typical  S/p ablation  HFpEF  Statin intolerance  Coronary artery disease with prior stenting  Chest discomfort-new onset   No interval arrhythmias.  Tolerating aspirin.  Euvolemic continue current meds  Concerned about his exertional new onset chest discomfort.  When he presented originally, he recalls no specific symptoms.  He has had a negative interval Myoview presumably  in [place].  This is at least encouraging that it makes it less likely that we will find a false positive.  As relates to insomnia suggested that he go through all of his medications with google to look for associations.  We will stop his metoprolol as this could be associated with insomnia and is only taking 12.5 mg.  We spent more than 50% of our >25 min visit in face to face counseling regarding the above        Virl Axe

## 2017-12-25 ENCOUNTER — Encounter
Admission: RE | Admit: 2017-12-25 | Discharge: 2017-12-25 | Disposition: A | Discharge status: Home / Self Care | Payer: Medicare Other | Source: Ambulatory Visit | Attending: Internal Medicine | Admitting: Internal Medicine

## 2017-12-25 DIAGNOSIS — R079 Chest pain, unspecified: Secondary | ICD-10-CM | POA: Diagnosis not present

## 2017-12-25 LAB — NM MYOCAR MULTI W/SPECT W/WALL MOTION / EF
CHL CUP RESTING HR STRESS: 62 {beats}/min
CSEPED: 4 min
CSEPEDS: 25 s
CSEPEW: 6.1 METS
LV dias vol: 111 mL (ref 62–150)
LV sys vol: 55 mL
Peak HR: 127 {beats}/min
Percent HR: 90 %
TID: 0.83

## 2017-12-25 MED ORDER — TECHNETIUM TC 99M TETROFOSMIN IV KIT
14.0080 | PACK | Freq: Once | INTRAVENOUS | Status: AC | PRN
  Administered 2017-12-25: 14.008 via INTRAVENOUS

## 2017-12-25 MED ORDER — TECHNETIUM TC 99M TETROFOSMIN IV KIT
33.2500 | PACK | Freq: Once | INTRAVENOUS | Status: AC | PRN
  Administered 2017-12-25: 33.25 via INTRAVENOUS

## 2017-12-26 ENCOUNTER — Telehealth: Payer: Self-pay

## 2017-12-26 ENCOUNTER — Encounter: Payer: Self-pay | Admitting: Family Medicine

## 2017-12-26 NOTE — Telephone Encounter (Signed)
-----   Message from Deboraha Sprang, MD sent at 12/26/2017 11:04 AM EDT ----- Please Inform Patient that stress test showed no evidence of ischemia,  normal heart muscle function   Thanks

## 2017-12-26 NOTE — Telephone Encounter (Signed)
Pt is aware and agreeable to normal results  

## 2017-12-27 ENCOUNTER — Ambulatory Visit
Payer: Medicare Other | Attending: Student in an Organized Health Care Education/Training Program | Admitting: Student in an Organized Health Care Education/Training Program

## 2017-12-27 ENCOUNTER — Encounter: Payer: Self-pay | Admitting: Student in an Organized Health Care Education/Training Program

## 2017-12-27 ENCOUNTER — Other Ambulatory Visit: Payer: Self-pay

## 2017-12-27 VITALS — BP 136/75 | HR 65 | Temp 98.3°F | Resp 16 | Ht 73.0 in | Wt 280.0 lb

## 2017-12-27 DIAGNOSIS — E781 Pure hyperglyceridemia: Secondary | ICD-10-CM | POA: Diagnosis not present

## 2017-12-27 DIAGNOSIS — Z79899 Other long term (current) drug therapy: Secondary | ICD-10-CM | POA: Insufficient documentation

## 2017-12-27 DIAGNOSIS — T402X5A Adverse effect of other opioids, initial encounter: Secondary | ICD-10-CM | POA: Insufficient documentation

## 2017-12-27 DIAGNOSIS — E7800 Pure hypercholesterolemia, unspecified: Secondary | ICD-10-CM | POA: Insufficient documentation

## 2017-12-27 DIAGNOSIS — Z955 Presence of coronary angioplasty implant and graft: Secondary | ICD-10-CM | POA: Diagnosis not present

## 2017-12-27 DIAGNOSIS — M5136 Other intervertebral disc degeneration, lumbar region: Secondary | ICD-10-CM | POA: Diagnosis not present

## 2017-12-27 DIAGNOSIS — I10 Essential (primary) hypertension: Secondary | ICD-10-CM | POA: Insufficient documentation

## 2017-12-27 DIAGNOSIS — N400 Enlarged prostate without lower urinary tract symptoms: Secondary | ICD-10-CM | POA: Insufficient documentation

## 2017-12-27 DIAGNOSIS — R0981 Nasal congestion: Secondary | ICD-10-CM | POA: Diagnosis not present

## 2017-12-27 DIAGNOSIS — G8929 Other chronic pain: Secondary | ICD-10-CM | POA: Insufficient documentation

## 2017-12-27 DIAGNOSIS — I2510 Atherosclerotic heart disease of native coronary artery without angina pectoris: Secondary | ICD-10-CM | POA: Insufficient documentation

## 2017-12-27 DIAGNOSIS — G4733 Obstructive sleep apnea (adult) (pediatric): Secondary | ICD-10-CM | POA: Diagnosis not present

## 2017-12-27 DIAGNOSIS — M546 Pain in thoracic spine: Secondary | ICD-10-CM | POA: Diagnosis not present

## 2017-12-27 DIAGNOSIS — Z85828 Personal history of other malignant neoplasm of skin: Secondary | ICD-10-CM | POA: Diagnosis not present

## 2017-12-27 DIAGNOSIS — Z96653 Presence of artificial knee joint, bilateral: Secondary | ICD-10-CM | POA: Insufficient documentation

## 2017-12-27 DIAGNOSIS — N183 Chronic kidney disease, stage 3 (moderate): Secondary | ICD-10-CM | POA: Diagnosis not present

## 2017-12-27 DIAGNOSIS — E039 Hypothyroidism, unspecified: Secondary | ICD-10-CM | POA: Diagnosis not present

## 2017-12-27 DIAGNOSIS — M47816 Spondylosis without myelopathy or radiculopathy, lumbar region: Secondary | ICD-10-CM | POA: Diagnosis not present

## 2017-12-27 DIAGNOSIS — M545 Low back pain: Secondary | ICD-10-CM | POA: Diagnosis not present

## 2017-12-27 DIAGNOSIS — Z7982 Long term (current) use of aspirin: Secondary | ICD-10-CM | POA: Insufficient documentation

## 2017-12-27 DIAGNOSIS — G4700 Insomnia, unspecified: Secondary | ICD-10-CM | POA: Insufficient documentation

## 2017-12-27 DIAGNOSIS — K5903 Drug induced constipation: Secondary | ICD-10-CM

## 2017-12-27 DIAGNOSIS — I4892 Unspecified atrial flutter: Secondary | ICD-10-CM | POA: Diagnosis not present

## 2017-12-27 DIAGNOSIS — Z9689 Presence of other specified functional implants: Secondary | ICD-10-CM

## 2017-12-27 DIAGNOSIS — I5032 Chronic diastolic (congestive) heart failure: Secondary | ICD-10-CM | POA: Insufficient documentation

## 2017-12-27 DIAGNOSIS — K5909 Other constipation: Secondary | ICD-10-CM | POA: Insufficient documentation

## 2017-12-27 NOTE — Progress Notes (Signed)
Patient's Name: Anthony [name]  MRN: [id number]  Referring Provider: Ria Bush, MD  DOB: [birth year]  PCP: Ria Bush, MD  DOS: 12/27/2017  Note by: Gillis Santa, MD  Service setting: Ambulatory outpatient  Specialty: Interventional Pain Management  Location: [facility] (AMB) Pain Management Facility    Patient type: Established   Primary Reason(s) for Visit: Evaluation of chronic illnesses with exacerbation, or progression (Level of risk: moderate) CC: Back Pain (lower, middle)  HPI  Anthony [name] is an 82 y.o. year old, male patient, who comes today for a follow-up evaluation. He has History of basal cell carcinoma (BCC); History of squamous cell carcinoma in situ of skin; Hypercholesterolemia with hypertriglyceridemia; Chronic constipation; OSA on CPAP; BPH (benign prostatic hyperplasia); History of atrial flutter; Chronic insomnia; (HFpEF) heart failure with preserved ejection fraction ([place]); Hypertension; Hypothyroidism; Severe obesity (BMI 35.0-39.9) with comorbidity ([place]); CAD (coronary artery disease); Chronic lower back pain; Nasal congestion; Therapeutic opioid-induced constipation (OIC); and CKD (chronic kidney disease) stage 3, GFR 30-59 ml/min (HCC) on their problem list. Anthony [name] was last seen on 10/18/2017. His primarily concern today is the Back Pain (lower, middle)  Pain Assessment: Location: Lower Back Radiating: denies Onset: More than a month ago Duration: Chronic pain Quality: Constant, Aching Severity: 5 /10 (self-reported pain score)  Note: Reported level is compatible with observation.                         When using our objective Pain Scale, levels between 6 and 10/10 are said to belong in an emergency room, as it progressively worsens from a 6/10, described as severely limiting, requiring emergency care not usually available at an outpatient pain management facility. At a 6/10 level, communication becomes difficult and requires great effort. Assistance to reach the  emergency department may be required. Facial flushing and profuse sweating along with potentially dangerous increases in heart rate and blood pressure will be evident. Effect on ADL: pt states he must lay down or sit almost all the time Timing: Constant Modifying factors: medications  Further details on both, my assessment(s), as well as the proposed treatment plan, please see below.  Patient continues to endorse a band of pain originating in his lower lumbar region that extends out to his he describes it as a deep achy throb that is always present.side.  He also states that he would like to see a neurosurgeon to discuss his spinal cord stimulator since it is not effective even after reprogramming.  Patient does have a Abbotts spinal cord stimulator. (xrays obtained at last visit did not show evidence of lead fracture or migration- may be disconnected from battery)  Laboratory Chemistry  Inflammation Markers (CRP: Acute Phase) (ESR: Chronic Phase) No results found for: CRP, ESRSEDRATE, LATICACIDVEN                       Rheumatology Markers No results found for: RF, ANA, LABURIC, URICUR, LYMEIGGIGMAB, LYMEABIGMQN                      Renal Function Markers Lab Results  Component Value Date   BUN 26 (H) 10/11/2017   CREATININE 1.45 10/11/2017   GFRAA 54 (L) 05/03/2017   GFRNONAA 47 (L) 05/03/2017                              Hepatic Function Markers Lab Results  Component  Value Date   AST 21 10/11/2017   ALT 23 10/11/2017   ALBUMIN 4.1 10/11/2017   ALKPHOS 49 10/11/2017                        Electrolytes Lab Results  Component Value Date   NA 139 10/11/2017   K 4.3 10/11/2017   CL 102 10/11/2017   CALCIUM 10.4 10/11/2017   PHOS 3.0 06/26/2017                        Neuropathy Markers No results found for: VITAMINB12, FOLATE, HGBA1C, HIV                      Bone Pathology Markers No results found for: VD25OH, [id number], [id number], [id number], 25OHVITD1, 25OHVITD2,  25OHVITD3, TESTOFREE, TESTOSTERONE                       Coagulation Parameters Lab Results  Component Value Date   PLT 272 05/03/2017                        Cardiovascular Markers Lab Results  Component Value Date   HGB 14.4 05/03/2017   HCT 43.3 05/03/2017                         CA Markers No results found for: CEA, CA125, LABCA2                      Note: Lab results reviewed.  Recent Diagnostic Imaging Review    Meds   Current Outpatient Medications:  .  aspirin 81 MG tablet, Take 81 mg by mouth daily., Disp: , Rfl:  .  diphenhydramine-acetaminophen (TYLENOL PM EXTRA STRENGTH) 25-500 MG TABS tablet, Take 1 tablet by mouth at bedtime as needed. (Patient taking differently: Take 1 tablet by mouth at bedtime as needed (for sleep.). ), Disp: , Rfl:  .  docusate sodium (COLACE) 100 MG capsule, Take 100 mg by mouth 2 (two) times daily., Disp: , Rfl:  .  Eszopiclone 3 MG TABS, Take 3 mg by mouth at bedtime as needed. Take immediately before bedtime , Disp: , Rfl:  .  ezetimibe (ZETIA) 10 MG tablet, TAKE 1 TABLET(10 MG) BY MOUTH DAILY, Disp: 90 tablet, Rfl: 2 .  fenofibrate 160 MG tablet, TAKE 1 TABLET BY MOUTH EVERY DAY, Disp: 90 tablet, Rfl: 0 .  finasteride (PROSCAR) 5 MG tablet, Take 1 tablet (5 mg total) by mouth daily., Disp: 90 tablet, Rfl: 3 .  furosemide (LASIX) 40 MG tablet, TAKE 1 TABLET BY MOUTH EVERY DAY. TAKE AN EXTRA TABLET AS NEEDED FOR WEIGHT GAIN OF>3LB/DAY OR 5LB/WEEK, Disp: 135 tablet, Rfl: 1 .  HYDROcodone-acetaminophen (NORCO/VICODIN) 5-325 MG tablet, Take 1 tablet by mouth 3 (three) times daily as needed for moderate pain. For chronic pain To fill on or after: 10/26/17, 11/24/17 Ok to fill 1 day earlier if pharmacy closed on fill date, Disp: 90 tablet, Rfl: 0 .  ketoconazole (NIZORAL) 2 % cream, Apply 1 application topically daily as needed (for dermatitits). , Disp: , Rfl:  .  levothyroxine (SYNTHROID, LEVOTHROID) 200 MCG tablet, Take 1 tablet (200 mcg total) by  mouth daily before breakfast., Disp: 90 tablet, Rfl: 3 .  linaclotide (LINZESS) 290 MCG CAPS capsule, Take 1 capsule (290 mcg total) by mouth daily. Take on an  empty stomach at least 30 minutes before 1st meal of the day, Disp: 90 capsule, Rfl: 3 .  lisinopril (PRINIVIL,ZESTRIL) 10 MG tablet, Take 1 tablet (10 mg total) by mouth daily., Disp: 90 tablet, Rfl: 3 .  loratadine (CLARITIN) 10 MG tablet, Take 1 tablet (10 mg total) by mouth daily., Disp: , Rfl:  .  Multiple Vitamin (MULTIVITAMIN) capsule, Take 1 capsule by mouth daily., Disp: , Rfl:  .  multivitamin-lutein (OCUVITE-LUTEIN) CAPS capsule, Take 1 capsule by mouth daily., Disp: , Rfl:  .  naloxegol oxalate (MOVANTIK) 25 MG TABS tablet, Take 1 tablet (25 mg total) by mouth daily. Take on an empty stomach at least 1 hour before or 2 hours after a meal., Disp: 30 tablet, Rfl: 2 .  nitroGLYCERIN (NITROSTAT) 0.4 MG SL tablet, Place 1 tablet (0.4 mg total) under the tongue every 5 (five) minutes as needed for chest pain., Disp: 25 tablet, Rfl: 0 .  omega-3 acid ethyl esters (LOVAZA) 1 g capsule, Take 2 capsules (2 g total) by mouth 2 (two) times daily., Disp: 360 capsule, Rfl: 3 .  orlistat (ALLI) 60 MG capsule, Take 1 capsule (60 mg total) by mouth daily as needed (diarrhea)., Disp: , Rfl:  .  polyethylene glycol powder (GLYCOLAX/MIRALAX) powder, Take 17 g by mouth 2 (two) times daily as needed., Disp: 3350 g, Rfl: 1 .  potassium chloride (K-DUR,KLOR-CON) 10 MEQ tablet, Take 1 tablet (10 mEq total) by mouth daily., Disp: , Rfl:  .  Psyllium (METAMUCIL FIBER) 51.7 % PACK, Take 5-10 mLs by mouth daily., Disp: , Rfl:  .  senna (SENOKOT) 8.6 MG TABS tablet, Take 3 tablets (25.8 mg total) by mouth daily as needed for moderate constipation., Disp: , Rfl: 0 .  tamsulosin (FLOMAX) 0.4 MG CAPS capsule, Take 2 capsules (0.8 mg total) by mouth daily after supper., Disp: 90 capsule, Rfl: 1 .  tiZANidine (ZANAFLEX) 4 MG capsule, Take 1 capsule (4 mg total) by  mouth 2 (two) times daily as needed for muscle spasms., Disp: 60 capsule, Rfl: 0  ROS  Constitutional: Denies any fever or chills Gastrointestinal: No reported hemesis, hematochezia, vomiting, or acute GI distress Musculoskeletal: Denies any acute onset joint swelling, redness, loss of ROM, or weakness Neurological: No reported episodes of acute onset apraxia, aphasia, dysarthria, agnosia, amnesia, paralysis, loss of coordination, or loss of consciousness  Allergies  Anthony [name] is allergic to atorvastatin; gemfibrozil; metformin and related; questran [cholestyramine]; and rosuvastatin.  PFSH  Drug: Anthony [name]  reports that he does not use drugs. Alcohol:  reports that he does not drink alcohol. Tobacco:  reports that he has never smoked. He has never used smokeless tobacco. Medical:  has a past medical history of Anosmia (1980s), Atrial flutter ([place]), CAD (coronary artery disease), Chronic constipation, Chronic insomnia, Chronic lower back pain, Diastolic CHF ([place]), History of diverticulitis (2017), History of pneumonia (2013), Hyperlipidemia, Hypertension, Hypothyroidism, Obesity, Class II, BMI 35-39.9, with comorbidity, and OSA on CPAP. Surgical: Anthony [name]  has a past surgical history that includes Percutaneous coronary stent intervention (pci-s) (2013); Replacement total knee bilateral (Bilateral, 2000s); Spinal cord stimulator implant (06/2016); Nasal sinus surgery; A-FLUTTER ABLATION (N/A, 05/07/2017); Radiofrequency ablation (06/2017); Colonoscopy (2007); Elbow surgery (Right, 2015); and Cardiovascular stress test (11/2017). Family: family history includes Kidney disease in his father; Leukemia in his brother; Stroke in his mother and sister.  Constitutional Exam  General appearance: Well nourished, well developed, and well hydrated. In no apparent acute distress Vitals:   12/27/17 8588  BP: 136/75  Pulse: 65  Resp: 16  Temp: 98.3 F (36.8 C)  TempSrc: Oral  SpO2: 95%  Weight: 280  lb (127 kg)  Height: '6\' 1"'  (1.854 m)   BMI Assessment: Estimated body mass index is 36.94 kg/m as calculated from the following:   Height as of this encounter: '6\' 1"'  (1.854 m).   Weight as of this encounter: 280 lb (127 kg).  BMI interpretation table: BMI level Category Range association with higher incidence of chronic pain  <18 kg/m2 Underweight   18.5-24.9 kg/m2 Ideal body weight   25-29.9 kg/m2 Overweight Increased incidence by 20%  30-34.9 kg/m2 Obese (Class I) Increased incidence by 68%  35-39.9 kg/m2 Severe obesity (Class II) Increased incidence by 136%  >40 kg/m2 Extreme obesity (Class III) Increased incidence by 254%   Patient's current BMI Ideal Body weight  Body mass index is 36.94 kg/m. Ideal body weight: 79.9 kg (176 lb 2.4 oz) Adjusted ideal body weight: 98.7 kg (217 lb 11 oz)   BMI Readings from Last 4 Encounters:  12/27/17 36.94 kg/m  12/20/17 37.11 kg/m  10/18/17 38.52 kg/m  10/18/17 37.97 kg/m   Wt Readings from Last 4 Encounters:  12/27/17 280 lb (127 kg)  12/20/17 281 lb 4 oz (127.6 kg)  10/18/17 284 lb (128.8 kg)  10/18/17 280 lb (127 kg)  Psych/Mental status: Alert, oriented x 3 (person, place, & time)       Eyes: PERLA Respiratory: No evidence of acute respiratory distress  Cervical Spine Area Exam  Skin & Axial Inspection: No masses, redness, edema, swelling, or associated skin lesions Alignment: Symmetrical Functional ROM: Unrestricted ROM      Stability: No instability detected Muscle Tone/Strength: Functionally intact. No obvious neuro-muscular anomalies detected. Sensory (Neurological): Unimpaired Palpation: No palpable anomalies              Upper Extremity (UE) Exam    Side: Right upper extremity  Side: Left upper extremity  Skin & Extremity Inspection: Skin color, temperature, and hair growth are WNL. No peripheral edema or cyanosis. No masses, redness, swelling, asymmetry, or associated skin lesions. No contractures.  Skin &  Extremity Inspection: Skin color, temperature, and hair growth are WNL. No peripheral edema or cyanosis. No masses, redness, swelling, asymmetry, or associated skin lesions. No contractures.  Functional ROM: Unrestricted ROM          Functional ROM: Unrestricted ROM          Muscle Tone/Strength: Functionally intact. No obvious neuro-muscular anomalies detected.  Muscle Tone/Strength: Functionally intact. No obvious neuro-muscular anomalies detected.  Sensory (Neurological): Unimpaired          Sensory (Neurological): Unimpaired          Palpation: No palpable anomalies              Palpation: No palpable anomalies              Specialized Test(s): Deferred         Specialized Test(s): Deferred          Thoracic Spine Area Exam  Skin & Axial Inspection: No masses, redness, or swelling Alignment: Symmetrical Functional ROM: Unrestricted ROM Stability: No instability detected Muscle Tone/Strength: Functionally intact. No obvious neuro-muscular anomalies detected. Sensory (Neurological): Unimpaired Muscle strength & Tone: No palpable anomalies  Lumbar Spine Area Exam  Skin & Axial Inspection: Well healed scar from previous spine surgery detected Alignment: Symmetrical Functional ROM: Decreased ROM       Stability: No instability detected Muscle  Tone/Strength: Functionally intact. No obvious neuro-muscular anomalies detected. Sensory (Neurological): Musculoskeletal pain pattern Palpation: No complaints of tenderness       Provocative Tests: Lumbar Hyperextension and rotation test: Positive bilaterally for facet joint pain. Lumbar Lateral bending test: Positive due to fusion restriction. Patrick's Maneuver: Negative for bilateral S-I arthralgia              Gait & Posture Assessment  Ambulation: Unassisted Gait: Relatively normal for age and body habitus Posture: WNL   Lower Extremity Exam    Side: Right lower extremity  Side: Left lower extremity  Skin & Extremity Inspection: Skin  color, temperature, and hair growth are WNL. No peripheral edema or cyanosis. No masses, redness, swelling, asymmetry, or associated skin lesions. No contractures.  Skin & Extremity Inspection: Skin color, temperature, and hair growth are WNL. No peripheral edema or cyanosis. No masses, redness, swelling, asymmetry, or associated skin lesions. No contractures.  Functional ROM: Unrestricted ROM          Functional ROM: Unrestricted ROM          Muscle Tone/Strength: Functionally intact. No obvious neuro-muscular anomalies detected.  Muscle Tone/Strength: Functionally intact. No obvious neuro-muscular anomalies detected.  Sensory (Neurological): Unimpaired  Sensory (Neurological): Unimpaired  Palpation: No palpable anomalies  Palpation: No palpable anomalies   Assessment  Primary Diagnosis & Pertinent Problem List: The primary encounter diagnosis was Spondylosis without myelopathy or radiculopathy, lumbar region. Diagnoses of Lumbar facet arthropathy, Chronic left-sided low back pain without sciatica, Lumbar degenerative disc disease, Spinal cord stimulator status, Therapeutic opioid-induced constipation (OIC), and Lumbar spondylosis were also pertinent to this visit.  Status Diagnosis  Persistent Persistent Persistent 1. Spondylosis without myelopathy or radiculopathy, lumbar region   2. Lumbar facet arthropathy   3. Chronic left-sided low back pain without sciatica   4. Lumbar degenerative disc disease   5. Spinal cord stimulator status   6. Therapeutic opioid-induced constipation (OIC)   7. Lumbar spondylosis     General Recommendations: The pain condition that the patient suffers from is best treated with a multidisciplinary approach that involves an increase in physical activity to prevent de-conditioning and worsening of the pain cycle, as well as psychological counseling (formal and/or informal) to address the co-morbid psychological affects of pain. Treatment will often involve judicious  use of pain medications and interventional procedures to decrease the pain, allowing the patient to participate in the physical activity that will ultimately produce long-lasting pain reductions. The goal of the multidisciplinary approach is to return the patient to a higher level of overall function and to restore their ability to perform activities of daily living.  82 year old male with a history of lumbar spondylosis, lumbar facet arthropathy most pronounced on the left status post left L3/4, L4/5, L5/S1 facet diagnostic nerve block on 06/13/2017, 10/31/18and subsequent radiofrequency ablation on July 09, 2017 and on 09/19/17. Patient states that he continues to have intermittent episodes of severe low back pain especially when he stands up.Patient continues to endorse a band of pain originating in his lower lumbar region that extends out to his he describes it as a deep achy throb that is always present  He also states that he would like to see a neurosurgeon to discuss his spinal cord stimulator since it is not effective even after programming.  Patient does have a Abbotts spinal cord stimulator.  We also discussed PT options and I recommend the patient try aquatic therapy for lumbar paraspinal muscle strengthening/ROM. We also discussed weight loss strategies.  Plan: -referral to Dr. Lacinda Axon for consideration of SCS revision. xrays obtained, no evidence of lead fracture/migration, SCS lead may be disconnected from battery since patient not experiencing relief as he did even after reprogramming attempts. Abbot SCS. -Recommendation for aquatic therapy -return in 2 months, can consider repeating LEFT lumbar radiofrequency ablation @ L3/4, L4/5, L5/S1.  Lab-work, procedure(s), and/or referral(s): Orders Placed This Encounter  Procedures  . Ambulatory referral to Neurosurgery    Provider-requested follow-up: Return in about 6 weeks (around 02/07/2018). Time Note: Greater than 50% of the 25  minute(s) of face-to-face time spent with Anthony [name], was spent in counseling/coordination of care regarding: Anthony [name] primary cause of pain, the treatment plan, realistic expectations, the goals of pain management (increased in functionality) and the need to bring and keep the BMI below 30.  Future Appointments  Date Time Provider Reader  01/15/2018 11:30 AM Ria Bush, MD [facility]  02/07/2018 11:30 AM Gillis Santa, MD [facility] None  10/15/2018 10:30 AM Eustace Pen, LPN [facility]  3/56/7014 10:30 AM Ria Bush, MD [facility]    Primary Care Physician: Ria Bush, MD Location: [facility] Note by: Gillis Santa, M.D Date: 12/27/2017; Time: 9:12 AM  There are no Patient Instructions on file for this visit.

## 2017-12-27 NOTE — Progress Notes (Signed)
Safety precautions to be maintained throughout the outpatient stay will include: orient to surroundings, keep bed in low position, maintain call bell within reach at all times, provide assistance with transfer out of bed and ambulation.  

## 2018-01-03 ENCOUNTER — Other Ambulatory Visit: Payer: Self-pay | Admitting: Family Medicine

## 2018-01-08 DIAGNOSIS — G894 Chronic pain syndrome: Secondary | ICD-10-CM | POA: Diagnosis not present

## 2018-01-11 ENCOUNTER — Other Ambulatory Visit: Payer: Self-pay | Admitting: Family Medicine

## 2018-01-15 ENCOUNTER — Ambulatory Visit (INDEPENDENT_AMBULATORY_CARE_PROVIDER_SITE_OTHER): Payer: Medicare Other | Admitting: Family Medicine

## 2018-01-15 ENCOUNTER — Encounter: Payer: Self-pay | Admitting: Family Medicine

## 2018-01-15 VITALS — BP 134/82 | HR 70 | Temp 97.8°F | Ht 73.0 in

## 2018-01-15 DIAGNOSIS — F5104 Psychophysiologic insomnia: Secondary | ICD-10-CM

## 2018-01-15 DIAGNOSIS — G8929 Other chronic pain: Secondary | ICD-10-CM | POA: Diagnosis not present

## 2018-01-15 DIAGNOSIS — N183 Chronic kidney disease, stage 3 unspecified: Secondary | ICD-10-CM

## 2018-01-15 DIAGNOSIS — M545 Low back pain: Secondary | ICD-10-CM | POA: Diagnosis not present

## 2018-01-15 DIAGNOSIS — K5903 Drug induced constipation: Secondary | ICD-10-CM

## 2018-01-15 DIAGNOSIS — K5909 Other constipation: Secondary | ICD-10-CM

## 2018-01-15 DIAGNOSIS — I2510 Atherosclerotic heart disease of native coronary artery without angina pectoris: Secondary | ICD-10-CM | POA: Diagnosis not present

## 2018-01-15 DIAGNOSIS — N400 Enlarged prostate without lower urinary tract symptoms: Secondary | ICD-10-CM | POA: Diagnosis not present

## 2018-01-15 DIAGNOSIS — T402X5A Adverse effect of other opioids, initial encounter: Secondary | ICD-10-CM

## 2018-01-15 NOTE — Assessment & Plan Note (Signed)
Caution with benadryl use for insomnia.

## 2018-01-15 NOTE — Assessment & Plan Note (Signed)
Recent stress test reassuring.

## 2018-01-15 NOTE — Assessment & Plan Note (Signed)
Congratulated on ongoing weight loss through healthy diet changes. Hopeful to be able to increase activity levels.

## 2018-01-15 NOTE — Assessment & Plan Note (Signed)
Significant improvement with decreased narcotic use. On linzess daily and metamucil daily with good effect, occasional constipation. Discussed senna use on those days. He will price out amitiza to see if any cheaper than linzess.

## 2018-01-15 NOTE — Assessment & Plan Note (Signed)
See above. movantik overly expensive

## 2018-01-15 NOTE — Assessment & Plan Note (Addendum)
RTC 4 months recheck this

## 2018-01-15 NOTE — Progress Notes (Signed)
BP 134/82 (BP Location: Left Arm, Patient Position: Sitting, Cuff Size: Large)   Pulse 70   Temp 97.8 F (36.6 C) (Oral)   Ht 6\' 1"  (1.854 m)   SpO2 94%   BMI 36.94 kg/m    CC: 3 mo f/u visit Subjective:    Patient ID: Anthony [name], male    DOB: [birth year], 82 y.o.   MRN: [id number]  HPI: Anthony [name] is a 82 y.o. male presenting on 01/15/2018 for 3 mo follow up (Here for CAD f/u.)   Declined weight today. See prior note for details.   Established with Dr Caryl Comes, latest note reviewed. S/p catheter ablation of afib 04/2017. New onset chest discomfort - myoview stress test was negative for ischemia with normal EF.   Chronic constipation - last visit we extensively reviewed bowel regimen - both slow transit constipation as well as narcotic induced constipation. linzess 236mcg daily restarted, as well as metamucil, miralax, sennokot, docusate all PRN. This helps but linzess is very expensive. Movantik unaffordable. Regularly takes metamucil and linzess once daily.   Trying to decrease narcotic dose. Working on weight loss. Working on healthy diet changes. Exercise regimen limited by lower back pain. States home weight at 269lbs. Averaging 2-2.5 lbs weight loss per week.   Hemorrhoids flaring up - may be interested in procedure for this.   Relevant past medical, surgical, family and social history reviewed and updated as indicated. Interim medical history since our last visit reviewed. Allergies and medications reviewed and updated. Outpatient Medications Prior to Visit  Medication Sig Dispense Refill  . aspirin 81 MG tablet Take 81 mg by mouth daily.    . diphenhydramine-acetaminophen (TYLENOL PM EXTRA STRENGTH) 25-500 MG TABS tablet Take 1 tablet by mouth at bedtime as needed. (Patient taking differently: Take 1 tablet by mouth at bedtime as needed (for sleep.). )    . docusate sodium (COLACE) 100 MG capsule Take 100 mg by mouth 2 (two) times daily.    . Eszopiclone 3 MG TABS Take  3 mg by mouth at bedtime as needed. Take immediately before bedtime     . ezetimibe (ZETIA) 10 MG tablet TAKE 1 TABLET(10 MG) BY MOUTH DAILY 90 tablet 2  . fenofibrate 160 MG tablet TAKE 1 TABLET BY MOUTH EVERY DAY 90 tablet 3  . finasteride (PROSCAR) 5 MG tablet Take 1 tablet (5 mg total) by mouth daily. 90 tablet 3  . furosemide (LASIX) 40 MG tablet TAKE 1 TABLET BY MOUTH EVERY DAY. TAKE AN EXTRA TABLET AS NEEDED FOR WEIGHT GAIN OF>3LB/DAY OR 5LB/WEEK 135 tablet 1  . HYDROcodone-acetaminophen (NORCO/VICODIN) 5-325 MG tablet Take 1 tablet by mouth 3 (three) times daily as needed for moderate pain. For chronic pain To fill on or after: 10/26/17, 11/24/17 Ok to fill 1 day earlier if pharmacy closed on fill date 90 tablet 0  . ketoconazole (NIZORAL) 2 % cream Apply 1 application topically daily as needed (for dermatitits).     Marland Kitchen levothyroxine (SYNTHROID, LEVOTHROID) 200 MCG tablet Take 1 tablet (200 mcg total) by mouth daily before breakfast. 90 tablet 3  . linaclotide (LINZESS) 290 MCG CAPS capsule Take 1 capsule (290 mcg total) by mouth daily. Take on an empty stomach at least 30 minutes before 1st meal of the day 90 capsule 3  . lisinopril (PRINIVIL,ZESTRIL) 10 MG tablet Take 1 tablet (10 mg total) by mouth daily. 90 tablet 3  . loratadine (CLARITIN) 10 MG tablet Take 1 tablet (10 mg total) by  mouth daily.    . Multiple Vitamin (MULTIVITAMIN) capsule Take 1 capsule by mouth daily.    . multivitamin-lutein (OCUVITE-LUTEIN) CAPS capsule Take 1 capsule by mouth daily.    . nitroGLYCERIN (NITROSTAT) 0.4 MG SL tablet Place 1 tablet (0.4 mg total) under the tongue every 5 (five) minutes as needed for chest pain. 25 tablet 0  . omega-3 acid ethyl esters (LOVAZA) 1 g capsule Take 2 capsules (2 g total) by mouth 2 (two) times daily. 360 capsule 3  . orlistat (ALLI) 60 MG capsule Take 1 capsule (60 mg total) by mouth daily as needed (diarrhea).    . potassium chloride (K-DUR,KLOR-CON) 10 MEQ tablet Take 1  tablet (10 mEq total) by mouth daily.    . Psyllium (METAMUCIL FIBER) 51.7 % PACK Take 5-10 mLs by mouth daily.    Marland Kitchen senna (SENOKOT) 8.6 MG TABS tablet Take 3 tablets (25.8 mg total) by mouth daily as needed for moderate constipation.  0  . tamsulosin (FLOMAX) 0.4 MG CAPS capsule Take 2 capsules (0.8 mg total) by mouth daily after supper. 90 capsule 1  . tiZANidine (ZANAFLEX) 4 MG capsule Take 1 capsule (4 mg total) by mouth 2 (two) times daily as needed for muscle spasms. 60 capsule 0  . polyethylene glycol powder (GLYCOLAX/MIRALAX) powder Take 17 g by mouth 2 (two) times daily as needed. 3350 g 1   No facility-administered medications prior to visit.      Per HPI unless specifically indicated in ROS section below Review of Systems     Objective:    BP 134/82 (BP Location: Left Arm, Patient Position: Sitting, Cuff Size: Large)   Pulse 70   Temp 97.8 F (36.6 C) (Oral)   Ht 6\' 1"  (1.854 m)   SpO2 94%   BMI 36.94 kg/m   Wt Readings from Last 3 Encounters:  12/27/17 280 lb (127 kg)  12/20/17 281 lb 4 oz (127.6 kg)  10/18/17 284 lb (128.8 kg)    Physical Exam  Constitutional: He appears well-developed and well-nourished. No distress.  HENT:  Mouth/Throat: Oropharynx is clear and moist. No oropharyngeal exudate.  Cardiovascular: Normal rate, regular rhythm and normal heart sounds.  No murmur heard. Pulmonary/Chest: Effort normal and breath sounds normal. No respiratory distress. He has no wheezes. He has no rales.  Musculoskeletal: He exhibits no edema.  Nursing note and vitals reviewed.      Assessment & Plan:   Problem List Items Addressed This Visit    BPH (benign prostatic hyperplasia)    Caution with benadryl use for insomnia.       CAD (coronary artery disease)    Recent stress test reassuring.      Chronic constipation - Primary    Significant improvement with decreased narcotic use. On linzess daily and metamucil daily with good effect, occasional  constipation. Discussed senna use on those days. He will price out amitiza to see if any cheaper than linzess.       Chronic insomnia    Ok to try zzzquil (melatonin)      Chronic lower back pain    Appreciate pain clinic management. Has been referred to neurosurgery.       CKD (chronic kidney disease) stage 3, GFR 30-59 ml/min (HCC)    RTC 4 months recheck this       Severe obesity (BMI 35.0-39.9) with comorbidity ([place])    Congratulated on ongoing weight loss through healthy diet changes. Hopeful to be able to increase activity levels.  Therapeutic opioid-induced constipation (OIC)    See above. movantik overly expensive          No orders of the defined types were placed in this encounter.  No orders of the defined types were placed in this encounter.   Follow up plan: Return if symptoms worsen or fail to improve.  Ria Bush, MD

## 2018-01-15 NOTE — Patient Instructions (Addendum)
Check with [facility] about amitiza in place of linzess.  Congratulations on weight loss - keep up healthy diet and ongoing weight loss! Ok to try melatonin or benadryl (zzzquil) Return in 4 months for lab visit only.

## 2018-01-15 NOTE — Assessment & Plan Note (Signed)
Appreciate pain clinic management. Has been referred to neurosurgery.

## 2018-01-15 NOTE — Assessment & Plan Note (Signed)
Ok to try zzzquil (melatonin)

## 2018-01-30 ENCOUNTER — Telehealth: Payer: Self-pay | Admitting: Family Medicine

## 2018-01-30 ENCOUNTER — Other Ambulatory Visit: Payer: Self-pay | Admitting: Family Medicine

## 2018-01-30 NOTE — Telephone Encounter (Signed)
Copied from [place] [phone]. Topic: Quick Communication - See Telephone Encounter >> Jan 30, 2018 11:03 AM Keene Breath wrote: CRM for notification. See Telephone encounter for: 01/30/18.  Barbaraann Rondo called from [facility] to request a correction on CPaP prescription.  Needs to include pressure setting and doctor NPI#.  CB# [phone]

## 2018-02-06 NOTE — Telephone Encounter (Signed)
Spoke with [facility] providing info from Dr. Darnell Level. Expressed thanks for calling back.

## 2018-02-06 NOTE — Telephone Encounter (Signed)
Setting is autoPAP range 6-14.  NPI # [id number].

## 2018-02-07 ENCOUNTER — Encounter: Payer: Self-pay | Admitting: Student in an Organized Health Care Education/Training Program

## 2018-02-07 ENCOUNTER — Other Ambulatory Visit: Payer: Self-pay

## 2018-02-07 ENCOUNTER — Ambulatory Visit
Payer: Medicare Other | Attending: Student in an Organized Health Care Education/Training Program | Admitting: Student in an Organized Health Care Education/Training Program

## 2018-02-07 VITALS — BP 133/88 | HR 67 | Temp 98.3°F | Resp 16 | Ht 73.0 in | Wt 264.0 lb

## 2018-02-07 DIAGNOSIS — Z79899 Other long term (current) drug therapy: Secondary | ICD-10-CM | POA: Insufficient documentation

## 2018-02-07 DIAGNOSIS — G4733 Obstructive sleep apnea (adult) (pediatric): Secondary | ICD-10-CM | POA: Diagnosis not present

## 2018-02-07 DIAGNOSIS — G894 Chronic pain syndrome: Secondary | ICD-10-CM | POA: Insufficient documentation

## 2018-02-07 DIAGNOSIS — M47816 Spondylosis without myelopathy or radiculopathy, lumbar region: Secondary | ICD-10-CM | POA: Insufficient documentation

## 2018-02-07 DIAGNOSIS — I4892 Unspecified atrial flutter: Secondary | ICD-10-CM | POA: Insufficient documentation

## 2018-02-07 DIAGNOSIS — Z955 Presence of coronary angioplasty implant and graft: Secondary | ICD-10-CM | POA: Insufficient documentation

## 2018-02-07 DIAGNOSIS — G8929 Other chronic pain: Secondary | ICD-10-CM

## 2018-02-07 DIAGNOSIS — I2510 Atherosclerotic heart disease of native coronary artery without angina pectoris: Secondary | ICD-10-CM | POA: Diagnosis not present

## 2018-02-07 DIAGNOSIS — K5909 Other constipation: Secondary | ICD-10-CM | POA: Diagnosis not present

## 2018-02-07 DIAGNOSIS — Z9689 Presence of other specified functional implants: Secondary | ICD-10-CM

## 2018-02-07 DIAGNOSIS — Z5181 Encounter for therapeutic drug level monitoring: Secondary | ICD-10-CM | POA: Diagnosis not present

## 2018-02-07 DIAGNOSIS — I5032 Chronic diastolic (congestive) heart failure: Secondary | ICD-10-CM | POA: Insufficient documentation

## 2018-02-07 DIAGNOSIS — Z79891 Long term (current) use of opiate analgesic: Secondary | ICD-10-CM | POA: Insufficient documentation

## 2018-02-07 DIAGNOSIS — M545 Low back pain: Secondary | ICD-10-CM | POA: Insufficient documentation

## 2018-02-07 DIAGNOSIS — M5136 Other intervertebral disc degeneration, lumbar region: Secondary | ICD-10-CM | POA: Diagnosis not present

## 2018-02-07 DIAGNOSIS — Z6834 Body mass index (BMI) 34.0-34.9, adult: Secondary | ICD-10-CM | POA: Insufficient documentation

## 2018-02-07 DIAGNOSIS — N183 Chronic kidney disease, stage 3 (moderate): Secondary | ICD-10-CM | POA: Insufficient documentation

## 2018-02-07 DIAGNOSIS — Z79890 Hormone replacement therapy (postmenopausal): Secondary | ICD-10-CM | POA: Insufficient documentation

## 2018-02-07 DIAGNOSIS — I130 Hypertensive heart and chronic kidney disease with heart failure and stage 1 through stage 4 chronic kidney disease, or unspecified chronic kidney disease: Secondary | ICD-10-CM | POA: Diagnosis not present

## 2018-02-07 DIAGNOSIS — E039 Hypothyroidism, unspecified: Secondary | ICD-10-CM | POA: Diagnosis not present

## 2018-02-07 DIAGNOSIS — Z7982 Long term (current) use of aspirin: Secondary | ICD-10-CM | POA: Insufficient documentation

## 2018-02-07 DIAGNOSIS — E7800 Pure hypercholesterolemia, unspecified: Secondary | ICD-10-CM | POA: Insufficient documentation

## 2018-02-07 DIAGNOSIS — N400 Enlarged prostate without lower urinary tract symptoms: Secondary | ICD-10-CM | POA: Diagnosis not present

## 2018-02-07 MED ORDER — HYDROCODONE-ACETAMINOPHEN 5-325 MG PO TABS: 1.0000 | ORAL_TABLET | Freq: Three times a day (TID) | ORAL | 0 refills | Status: DC | PRN

## 2018-02-07 NOTE — Progress Notes (Signed)
Patient's Name: Anthony [name]  MRN: [id number]  Referring Provider: Ria Bush, MD  DOB: 1936/04/10  PCP: Ria Bush, MD  DOS: 02/07/2018  Note by: Gillis Santa, MD  Service setting: Ambulatory outpatient  Specialty: Interventional Pain Management  Location: [facility] (AMB) Pain Management Facility    Patient type: Established   Primary Reason(s) for Visit: Encounter for prescription drug management. (Level of risk: moderate)  CC: Back Pain (lower)  HPI  Anthony [name] is a 82 y.o. year old, male patient, who comes today for a medication management evaluation. He has History of basal cell carcinoma (BCC); History of squamous cell carcinoma in situ of skin; Hypercholesterolemia with hypertriglyceridemia; Chronic constipation; OSA on CPAP; BPH (benign prostatic hyperplasia); History of atrial flutter; Chronic insomnia; (HFpEF) heart failure with preserved ejection fraction ([place]); Hypertension; Hypothyroidism; Severe obesity (BMI 35.0-39.9) with comorbidity ([place]); CAD (coronary artery disease); Chronic lower back pain; Nasal congestion; Therapeutic opioid-induced constipation (OIC); and CKD (chronic kidney disease) stage 3, GFR 30-59 ml/min (HCC) on their problem list. His primarily concern today is the Back Pain (lower)  Pain Assessment: Location: Lower Back Radiating: denies Onset: More than a month ago Duration: Chronic pain Quality: Aching, Dull Severity: 5 /10 (subjective, self-reported pain score)  Note: Reported level is compatible with observation.                         When using our objective Pain Scale, levels between 6 and 10/10 are said to belong in an emergency room, as it progressively worsens from a 6/10, described as severely limiting, requiring emergency care not usually available at an outpatient pain management facility. At a 6/10 level, communication becomes difficult and requires great effort. Assistance to reach the emergency department may be required. Facial flushing  and profuse sweating along with potentially dangerous increases in heart rate and blood pressure will be evident. Effect on ADL:   Timing: Constant Modifying factors: nothing BP: 133/88  HR: 67  Anthony [name] was last scheduled for an appointment on 12/27/2017 for medication management. During today's appointment we reviewed Anthony [name] chronic pain status, as well as his outpatient medication regimen.  Patient presents today for medication refill.  Patient has lost approximately 20 pounds since his last appointment with me.  I commended him on his weight loss.  Patient states that he has reduced his meat intake and has been focusing mainly on vegetables as well as exercising in the morning.  Is also been drinking a boost in the morning and has reduced his snacking.  Patient states that he feels better.  The patient  reports that he does not use drugs. His body mass index is 34.83 kg/m.  Further details on both, my assessment(s), as well as the proposed treatment plan, please see below.  Controlled Substance Pharmacotherapy Assessment REMS (Risk Evaluation and Mitigation Strategy)  Analgesic: Hydrocodone 5 mg 3 times daily as needed, quantity 90/month MME/day: 15 mg/day.  Landis Martins, RN  02/07/2018 11:55 AM  Sign at close encounter Nursing Pain Medication Assessment:  Safety precautions to be maintained throughout the outpatient stay will include: orient to surroundings, keep bed in low position, maintain call bell within reach at all times, provide assistance with transfer out of bed and ambulation.  Medication Inspection Compliance: Anthony [name] did not comply with our request to bring his pills to be counted. He was reminded that bringing the medication bottles, even when empty, is a requirement.  Medication: None brought in.  Pill/Patch Count: None available to be counted. Bottle Appearance: No container available. Did not bring bottle(s) to appointment. Filled Date: N/A Last  Medication intake:  Today   Pharmacokinetics: Liberation and absorption (onset of action): WNL Distribution (time to peak effect): WNL Metabolism and excretion (duration of action): WNL         Pharmacodynamics: Desired effects: Analgesia: Anthony [name] reports >50% benefit. Functional ability: Patient reports that medication allows him to accomplish basic ADLs Clinically meaningful improvement in function (CMIF): Sustained CMIF goals met Perceived effectiveness: Described as relatively effective, allowing for increase in activities of daily living (ADL) Undesirable effects: Side-effects or Adverse reactions: None reported Monitoring: [place] PMP: Online review of the past 61-monthperiod conducted. Compliant with practice rules and regulations Last UDS on record: Summary  Date Value Ref Range Status  04/24/2017 FINAL  Final    Comment:    ==================================================================== TOXASSURE COMP DRUG ANALYSIS,UR ==================================================================== Test                             Result       Flag       Units Drug Present and Declared for Prescription Verification   Hydrocodone                    173          EXPECTED   ng/mg creat   Norhydrocodone                 195          EXPECTED   ng/mg creat    Sources of hydrocodone include scheduled prescription    medications. Norhydrocodone is an expected metabolite of    hydrocodone.   Acetaminophen                  PRESENT      EXPECTED   Diphenhydramine                PRESENT      EXPECTED   Metoprolol                     PRESENT      EXPECTED Drug Absent but Declared for Prescription Verification   Tizanidine                     Not Detected UNEXPECTED    Tizanidine, as indicated in the declared medication list, is not    always detected even when used as directed.   Zopiclone/Eszopiclone          Not Detected UNEXPECTED    Eszopiclone, as indicated in the declared medication  list, is not    always detected even when used as directed.   Zaleplon                       Not Detected UNEXPECTED    Zaleplon, as indicated in the declared medication list, is not    always detected even when used as directed.   Salicylate                     Not Detected UNEXPECTED    Aspirin, as indicated in the declared medication list, is not    always detected even when used as directed. ==================================================================== Test  Result    Flag   Units      Ref Range   Creatinine              78               mg/dL      >=20 ==================================================================== Declared Medications:  The flagging and interpretation on this report are based on the  following declared medications.  Unexpected results may arise from  inaccuracies in the declared medications.  **Note: The testing scope of this panel includes these medications:  Diphenhydramine (Tylenol PM)  Hydrocodone (Hydrocodone-Acetaminophen)  Metoprolol  **Note: The testing scope of this panel does not include small to  moderate amounts of these reported medications:  Acetaminophen (Hydrocodone-Acetaminophen)  Acetaminophen (Tylenol PM)  Aspirin  Eszopiclone  Tizanidine  Zaleplon ([place])  **Note: The testing scope of this panel does not include following  reported medications:  Apixaban (Eliquis)  Docusate (Colace)  Ezetimibe (Zetia)  Fenofibrate  Finasteride (Proscar)  Furosemide (Lasix)  Ketoconazole  Levothyroxine  Linaclotide (Linzess)  Lisinopril  Montelukast (Singulair)  Multivitamin  Nitroglycerin  Omega-3 Fatty Acids (Lovaza)  Polyethylene Glycol  Potassium  Tamsulosin (Flomax) ==================================================================== For clinical consultation, please call [phone]. ====================================================================    UDS interpretation: Compliant           Medication Assessment Form: Reviewed. Patient indicates being compliant with therapy Treatment compliance: Compliant Risk Assessment Profile: Aberrant behavior: See prior evaluations. None observed or detected today Comorbid factors increasing risk of overdose: See prior notes. No additional risks detected today Risk of substance use disorder (SUD): Low Opioid Risk Tool - 12/27/17 0834      Family History of Substance Abuse   Alcohol  Negative    Illegal Drugs  Negative    Rx Drugs  Negative      Personal History of Substance Abuse   Alcohol  Negative    Illegal Drugs  Negative    Rx Drugs  Negative      Age   Age between 2-45 years   No      History of Preadolescent Sexual Abuse   History of Preadolescent Sexual Abuse  Negative or Male      Psychological Disease   Psychological Disease  Negative    Depression  Negative      Total Score   Opioid Risk Tool Scoring  0    Opioid Risk Interpretation  Low Risk      ORT Scoring interpretation table:  Score <3 = Low Risk for SUD  Score between 4-7 = Moderate Risk for SUD  Score >8 = High Risk for Opioid Abuse   Risk Mitigation Strategies:  Patient Counseling: Covered Patient-Prescriber Agreement (PPA): Present and active  Notification to other healthcare providers: Done  Pharmacologic Plan: No change in therapy, at this time.             Laboratory Chemistry  Inflammation Markers (CRP: Acute Phase) (ESR: Chronic Phase) No results found for: CRP, ESRSEDRATE, LATICACIDVEN                       Rheumatology Markers No results found for: RF, ANA, LABURIC, URICUR, LYMEIGGIGMAB, LYMEABIGMQN, HLAB27                      Renal Function Markers Lab Results  Component Value Date   BUN 26 (H) 10/11/2017   CREATININE 1.45 10/11/2017   BCR 16 05/03/2017   GFRAA 54 (L) 05/03/2017  GFRNONAA 47 (L) 05/03/2017                             Hepatic Function Markers Lab Results  Component Value Date   AST 21 10/11/2017    ALT 23 10/11/2017   ALBUMIN 4.1 10/11/2017   ALKPHOS 49 10/11/2017                        Electrolytes Lab Results  Component Value Date   NA 139 10/11/2017   K 4.3 10/11/2017   CL 102 10/11/2017   CALCIUM 10.4 10/11/2017   PHOS 3.0 06/26/2017                        Neuropathy Markers No results found for: VITAMINB12, FOLATE, HGBA1C, HIV                      Bone Pathology Markers No results found for: VD25OH, VD125OH2TOT, PX1062IR4, WN4627OJ5, 25OHVITD1, 25OHVITD2, 25OHVITD3, TESTOFREE, TESTOSTERONE                       Coagulation Parameters Lab Results  Component Value Date   PLT 272 05/03/2017                        Cardiovascular Markers Lab Results  Component Value Date   HGB 14.4 05/03/2017   HCT 43.3 05/03/2017                         CA Markers No results found for: CEA, CA125, LABCA2                      Note: Lab results reviewed.  Recent Diagnostic Imaging Results  NM Myocar Multi W/Spect W/Wall Motion / EF  Normal myocardial perfusion without ischemia or scar.  The left ventricular ejection fraction is normal (57%) with normal wall  motion.  Decreased exercise capacity.  This is a low risk study.    Complexity Note: Imaging results reviewed. Results shared with Anthony [name], using Layman's terms.                         Meds   Current Outpatient Medications:  .  aspirin 81 MG tablet, Take 81 mg by mouth daily., Disp: , Rfl:  .  diphenhydramine-acetaminophen (TYLENOL PM EXTRA STRENGTH) 25-500 MG TABS tablet, Take 1 tablet by mouth at bedtime as needed. (Patient taking differently: Take 1 tablet by mouth at bedtime as needed (for sleep.). ), Disp: , Rfl:  .  docusate sodium (COLACE) 100 MG capsule, Take 100 mg by mouth 2 (two) times daily., Disp: , Rfl:  .  Eszopiclone 3 MG TABS, Take 3 mg by mouth at bedtime as needed. Take immediately before bedtime , Disp: , Rfl:  .  ezetimibe (ZETIA) 10 MG tablet, TAKE 1 TABLET(10 MG) BY MOUTH DAILY, Disp:  90 tablet, Rfl: 2 .  fenofibrate 160 MG tablet, TAKE 1 TABLET BY MOUTH EVERY DAY, Disp: 90 tablet, Rfl: 3 .  finasteride (PROSCAR) 5 MG tablet, Take 1 tablet (5 mg total) by mouth daily., Disp: 90 tablet, Rfl: 3 .  furosemide (LASIX) 40 MG tablet, TAKE 1 TABLET BY MOUTH EVERY DAY. TAKE AN EXTRA TABLET AS NEEDED FOR WEIGHT GAIN OF>3LB/DAY OR 5LB/WEEK,  Disp: 135 tablet, Rfl: 1 .  [START ON 02/11/2018] HYDROcodone-acetaminophen (NORCO/VICODIN) 5-325 MG tablet, Take 1 tablet by mouth 3 (three) times daily as needed for moderate pain. For chronic pain Ok to fill 1 day earlier if pharmacy closed on fill date, Disp: 90 tablet, Rfl: 0 .  ketoconazole (NIZORAL) 2 % cream, Apply 1 application topically daily as needed (for dermatitits). , Disp: , Rfl:  .  levothyroxine (SYNTHROID, LEVOTHROID) 200 MCG tablet, Take 1 tablet (200 mcg total) by mouth daily before breakfast., Disp: 90 tablet, Rfl: 3 .  levothyroxine (SYNTHROID, LEVOTHROID) 200 MCG tablet, Take 200 mcg by mouth daily before breakfast., Disp: , Rfl:  .  linaclotide (LINZESS) 290 MCG CAPS capsule, Take 1 capsule (290 mcg total) by mouth daily. Take on an empty stomach at least 30 minutes before 1st meal of the day, Disp: 90 capsule, Rfl: 3 .  lisinopril (PRINIVIL,ZESTRIL) 10 MG tablet, Take 1 tablet (10 mg total) by mouth daily., Disp: 90 tablet, Rfl: 3 .  loratadine (CLARITIN) 10 MG tablet, Take 1 tablet (10 mg total) by mouth daily., Disp: , Rfl:  .  Multiple Vitamin (MULTIVITAMIN) capsule, Take 1 capsule by mouth daily., Disp: , Rfl:  .  multivitamin-lutein (OCUVITE-LUTEIN) CAPS capsule, Take 1 capsule by mouth daily., Disp: , Rfl:  .  nitroGLYCERIN (NITROSTAT) 0.4 MG SL tablet, Place 1 tablet (0.4 mg total) under the tongue every 5 (five) minutes as needed for chest pain., Disp: 25 tablet, Rfl: 0 .  omega-3 acid ethyl esters (LOVAZA) 1 g capsule, Take 2 capsules (2 g total) by mouth 2 (two) times daily., Disp: 360 capsule, Rfl: 3 .  orlistat (ALLI)  60 MG capsule, Take 1 capsule (60 mg total) by mouth daily as needed (diarrhea)., Disp: , Rfl:  .  potassium chloride (K-DUR,KLOR-CON) 10 MEQ tablet, Take 1 tablet (10 mEq total) by mouth daily., Disp: , Rfl:  .  Psyllium (METAMUCIL FIBER) 51.7 % PACK, Take 5-10 mLs by mouth daily., Disp: , Rfl:  .  senna (SENOKOT) 8.6 MG TABS tablet, Take 3 tablets (25.8 mg total) by mouth daily as needed for moderate constipation., Disp: , Rfl: 0 .  tamsulosin (FLOMAX) 0.4 MG CAPS capsule, TAKE 2 CAPSULES(0.8 MG) BY MOUTH DAILY AFTER SUPPER, Disp: 90 capsule, Rfl: 1 .  tiZANidine (ZANAFLEX) 4 MG capsule, Take 1 capsule (4 mg total) by mouth 2 (two) times daily as needed for muscle spasms., Disp: 60 capsule, Rfl: 0  ROS  Constitutional: Denies any fever or chills Gastrointestinal: No reported hemesis, hematochezia, vomiting, or acute GI distress Musculoskeletal: Denies any acute onset joint swelling, redness, loss of ROM, or weakness Neurological: No reported episodes of acute onset apraxia, aphasia, dysarthria, agnosia, amnesia, paralysis, loss of coordination, or loss of consciousness  Allergies  Anthony [name] is allergic to atorvastatin; gemfibrozil; metformin and related; questran [cholestyramine]; and rosuvastatin.  PFSH  Drug: Anthony [name]  reports that he does not use drugs. Alcohol:  reports that he does not drink alcohol. Tobacco:  reports that he has never smoked. He has never used smokeless tobacco. Medical:  has a past medical history of Anosmia (1980s), Atrial flutter ([place]), CAD (coronary artery disease), Chronic constipation, Chronic insomnia, Chronic lower back pain, Diastolic CHF ([place]), History of diverticulitis (2017), History of pneumonia (2013), Hyperlipidemia, Hypertension, Hypothyroidism, Obesity, Class II, BMI 35-39.9, with comorbidity, and OSA on CPAP. Surgical: Anthony [name]  has a past surgical history that includes Percutaneous coronary stent intervention (pci-s) (2013); Replacement total knee  bilateral (  Bilateral, 2000s); Spinal cord stimulator implant (06/2016); Nasal sinus surgery; A-FLUTTER ABLATION (N/A, 05/07/2017); Radiofrequency ablation (06/2017); Colonoscopy (2007); Elbow surgery (Right, 2015); and Cardiovascular stress test (11/2017). Family: family history includes Kidney disease in his father; Leukemia in his brother; Stroke in his mother and sister.  Constitutional Exam  General appearance: Well nourished, well developed, and well hydrated. In no apparent acute distress Vitals:   02/07/18 1146  BP: 133/88  Pulse: 67  Resp: 16  Temp: 98.3 F (36.8 C)  TempSrc: Oral  SpO2: 97%  Weight: 264 lb (119.7 kg)  Height: '6\' 1"'  (1.854 m)   BMI Assessment: Estimated body mass index is 34.83 kg/m as calculated from the following:   Height as of this encounter: '6\' 1"'  (1.854 m).   Weight as of this encounter: 264 lb (119.7 kg).  BMI interpretation table: BMI level Category Range association with higher incidence of chronic pain  <18 kg/m2 Underweight   18.5-24.9 kg/m2 Ideal body weight   25-29.9 kg/m2 Overweight Increased incidence by 20%  30-34.9 kg/m2 Obese (Class I) Increased incidence by 68%  35-39.9 kg/m2 Severe obesity (Class II) Increased incidence by 136%  >40 kg/m2 Extreme obesity (Class III) Increased incidence by 254%   Patient's current BMI Ideal Body weight  Body mass index is 34.83 kg/m. Ideal body weight: 79.9 kg (176 lb 2.4 oz) Adjusted ideal body weight: 95.8 kg (211 lb 4.6 oz)   BMI Readings from Last 4 Encounters:  02/07/18 34.83 kg/m  01/15/18 36.94 kg/m  12/27/17 36.94 kg/m  12/20/17 37.11 kg/m   Wt Readings from Last 4 Encounters:  02/07/18 264 lb (119.7 kg)  12/27/17 280 lb (127 kg)  12/20/17 281 lb 4 oz (127.6 kg)  10/18/17 284 lb (128.8 kg)  Psych/Mental status: Alert, oriented x 3 (person, place, & time)       Eyes: PERLA Respiratory: No evidence of acute respiratory distress  Cervical Spine Area Exam  Skin & Axial Inspection:  No masses, redness, edema, swelling, or associated skin lesions Alignment: Symmetrical Functional ROM: Unrestricted ROM      Stability: No instability detected Muscle Tone/Strength: Functionally intact. No obvious neuro-muscular anomalies detected. Sensory (Neurological): Unimpaired Palpation: No palpable anomalies              Upper Extremity (UE) Exam    Side: Right upper extremity  Side: Left upper extremity  Skin & Extremity Inspection: Skin color, temperature, and hair growth are WNL. No peripheral edema or cyanosis. No masses, redness, swelling, asymmetry, or associated skin lesions. No contractures.  Skin & Extremity Inspection: Skin color, temperature, and hair growth are WNL. No peripheral edema or cyanosis. No masses, redness, swelling, asymmetry, or associated skin lesions. No contractures.  Functional ROM: Unrestricted ROM          Functional ROM: Unrestricted ROM          Muscle Tone/Strength: Functionally intact. No obvious neuro-muscular anomalies detected.  Muscle Tone/Strength: Functionally intact. No obvious neuro-muscular anomalies detected.  Sensory (Neurological): Unimpaired          Sensory (Neurological): Unimpaired          Palpation: No palpable anomalies              Palpation: No palpable anomalies              Provocative Test(s):  Phalen's test: deferred Tinel's test: deferred Apley's scratch test (touch opposite shoulder):  Action 1 (Across chest): deferred Action 2 (Overhead): deferred Action 3 (LB reach): deferred  Provocative Test(s):  Phalen's test: deferred Tinel's test: deferred Apley's scratch test (touch opposite shoulder):  Action 1 (Across chest): deferred Action 2 (Overhead): deferred Action 3 (LB reach): deferred    Thoracic Spine Area Exam  Skin & Axial Inspection: No masses, redness, or swelling Alignment: Symmetrical Functional ROM: Unrestricted ROM Stability: No instability detected Muscle Tone/Strength: Functionally intact. No  obvious neuro-muscular anomalies detected. Sensory (Neurological): Unimpaired Muscle strength & Tone: No palpable anomalies  Lumbar Spine Area Exam  Skin & Axial Inspection: Well healed scar from previous spine surgery detected Alignment: Symmetrical Functional ROM: Decreased ROM       Stability: No instability detected Muscle Tone/Strength: Functionally intact. No obvious neuro-muscular anomalies detected. Sensory (Neurological): Dermatomal pain pattern and musculoskeletal Palpation: Complains of area being tender to palpation       Provocative Tests: Lumbar Hyperextension/rotation test: (+) bilaterally for facet joint pain. Lumbar quadrant test (Kemp's test): (+) due to pain. Lumbar Lateral bending test: deferred today       Patrick's Maneuver: deferred today                   FABER test: deferred today       Thigh-thrust test: deferred today       S-I compression test: deferred today       S-I distraction test: deferred today        Gait & Posture Assessment  Ambulation: Unassisted Gait: Relatively normal for age and body habitus Posture: WNL   Lower Extremity Exam    Side: Right lower extremity  Side: Left lower extremity  Stability: No instability observed          Stability: No instability observed          Skin & Extremity Inspection: Skin color, temperature, and hair growth are WNL. No peripheral edema or cyanosis. No masses, redness, swelling, asymmetry, or associated skin lesions. No contractures.  Skin & Extremity Inspection: Skin color, temperature, and hair growth are WNL. No peripheral edema or cyanosis. No masses, redness, swelling, asymmetry, or associated skin lesions. No contractures.  Functional ROM: Unrestricted ROM                  Functional ROM: Unrestricted ROM                  Muscle Tone/Strength: Functionally intact. No obvious neuro-muscular anomalies detected.  Muscle Tone/Strength: Functionally intact. No obvious neuro-muscular anomalies detected.   Sensory (Neurological): Unimpaired  Sensory (Neurological): Unimpaired  Palpation: No palpable anomalies  Palpation: No palpable anomalies   Assessment  Primary Diagnosis & Pertinent Problem List: The primary encounter diagnosis was Spondylosis without myelopathy or radiculopathy, lumbar region. Diagnoses of Lumbar facet arthropathy, Chronic left-sided low back pain without sciatica, Lumbar degenerative disc disease, Spinal cord stimulator status, and Chronic pain syndrome were also pertinent to this visit.  Status Diagnosis  Persistent Persistent Persistent 1. Spondylosis without myelopathy or radiculopathy, lumbar region   2. Lumbar facet arthropathy   3. Chronic left-sided low back pain without sciatica   4. Lumbar degenerative disc disease   5. Spinal cord stimulator status   6. Chronic pain syndrome      General Recommendations: The pain condition that the patient suffers from is best treated with a multidisciplinary approach that involves an increase in physical activity to prevent de-conditioning and worsening of the pain cycle, as well as psychological counseling (formal and/or informal) to address the co-morbid psychological affects of pain. Treatment will often involve judicious use  of pain medications and interventional procedures to decrease the pain, allowing the patient to participate in the physical activity that will ultimately produce long-lasting pain reductions. The goal of the multidisciplinary approach is to return the patient to a higher level of overall function and to restore their ability to perform activities of daily living.  82 year old male with a history of lumbar spondylosis, lumbar facet arthropathy most pronounced on the left status post left L3/4, L4/5, L5/S1 facet diagnostic nerve block on 06/13/2017, 10/31/18and subsequent radiofrequency ablation on November 12, 2018and on 09/19/17. Patient states that he continues to have intermittent episodes of severe  low back pain especially when he stands up.Patient continues to endorse a band of pain originating in his lower lumbar region that extends out to his he describes it as a deep achy throb that is always present.  Since patient's last visit, patient has lost approximately 20 pounds since his last appointment with me.  I commended him on his weight loss.  Patient states that he has reduced his meat intake and has been focusing mainly on vegetables as well as exercising in the morning.  He has also been drinking a boost in the morning and has reduced his snacking.  Patient states that he feels better.  Patient also met with Dr. Lacinda Axon with [facility] regarding his nonfunctional spinal cord stimulator.  Dr. Lacinda Axon recommended Benadryl placement versus revision of spinal cord stimulator.  Patient will need CT myelogram prior to proceeding.  I have recommended that he follow-up with Dr. Lacinda Axon regarding his nonfunctional spinal cord stimulator as this could help with his radicular pain if we get it functioning again.  Plan: -Refill hydrocodone as below -Follow with Dr. Lacinda Axon regarding spinal cord stimulator battery revision versus total SCS replacement -Encourage patient to continue with his weight loss strategies which include exercise and dietary modification. -Follow up in 2 months- can consider repeating LEFT lumbar radiofrequency ablation @ L3/4, L4/5, L5/S1.     Plan of Care  Pharmacotherapy (Medications Ordered): Meds ordered this encounter  Medications  . HYDROcodone-acetaminophen (NORCO/VICODIN) 5-325 MG tablet    Sig: Take 1 tablet by mouth 3 (three) times daily as needed for moderate pain. For chronic pain Ok to fill 1 day earlier if pharmacy closed on fill date    Dispense:  90 tablet    Refill:  0   Provider-requested follow-up: Return in about 8 weeks (around 04/04/2018) for Medication Management.  Future Appointments  Date Time Provider [place]  04/04/2018 11:30 AM Gillis Santa, MD [facility] None  05/21/2018 11:00 AM [facility]  10/15/2018 10:30 AM Eustace Pen, LPN [facility]  0/48/8891 10:30 AM Ria Bush, MD [facility]   Time Note: Greater than 50% of the 25 minute(s) of face-to-face time spent with Anthony [name], was spent in counseling/coordination of care regarding: Mr. Schrieber primary cause of pain, the treatment plan, treatment alternatives, medication side effects, the appropriate use of his medications, realistic expectations, the goals of pain management (increased in functionality), the need to bring and keep the BMI below 30, the medication agreement and the patient's responsibilities when it comes to controlled substances. Primary Care Physician: Ria Bush, MD Location: [facility] Note by: Gillis Santa, M.D Date: 02/07/2018; Time: 1:53 PM  Patient Instructions  You were given one prescription for Hydrocodone.

## 2018-02-07 NOTE — Patient Instructions (Signed)
You were given one prescription for Hydrocodone.    

## 2018-02-07 NOTE — Telephone Encounter (Signed)
Placed in [name]'s box. 

## 2018-02-07 NOTE — Telephone Encounter (Signed)
Faxed rx to [facility].

## 2018-02-07 NOTE — Progress Notes (Signed)
Nursing Pain Medication Assessment:  Safety precautions to be maintained throughout the outpatient stay will include: orient to surroundings, keep bed in low position, maintain call bell within reach at all times, provide assistance with transfer out of bed and ambulation.  Medication Inspection Compliance: Mr. Failla did not comply with our request to bring his pills to be counted. He was reminded that bringing the medication bottles, even when empty, is a requirement.  Medication: None brought in. Pill/Patch Count: None available to be counted. Bottle Appearance: No container available. Did not bring bottle(s) to appointment. Filled Date: N/A Last Medication intake:  Today

## 2018-02-07 NOTE — Telephone Encounter (Signed)
Returned call from [facility] stating they need an rx with the setting range and Dr. Synthia Innocent NPI on it faxed to [phone], ATTN:  Barbaraann Rondo.

## 2018-02-20 ENCOUNTER — Other Ambulatory Visit: Payer: Self-pay | Admitting: Neurosurgery

## 2018-02-20 DIAGNOSIS — T85192D Other mechanical complication of implanted electronic neurostimulator (electrode) of spinal cord, subsequent encounter: Secondary | ICD-10-CM

## 2018-03-05 DIAGNOSIS — M546 Pain in thoracic spine: Secondary | ICD-10-CM | POA: Diagnosis not present

## 2018-03-05 DIAGNOSIS — G894 Chronic pain syndrome: Secondary | ICD-10-CM | POA: Diagnosis not present

## 2018-03-05 DIAGNOSIS — M5126 Other intervertebral disc displacement, lumbar region: Secondary | ICD-10-CM | POA: Diagnosis not present

## 2018-03-08 ENCOUNTER — Other Ambulatory Visit: Payer: Self-pay

## 2018-03-08 ENCOUNTER — Ambulatory Visit
Admission: RE | Admit: 2018-03-08 | Discharge: 2018-03-08 | Disposition: A | Discharge status: Home / Self Care | Payer: Medicare Other | Source: Ambulatory Visit | Attending: Neurosurgery | Admitting: Neurosurgery

## 2018-03-08 ENCOUNTER — Encounter
Admission: RE | Admit: 2018-03-08 | Discharge: 2018-03-08 | Disposition: A | Discharge status: Home / Self Care | Payer: Medicare Other | Source: Ambulatory Visit | Attending: Neurosurgery | Admitting: Neurosurgery

## 2018-03-08 DIAGNOSIS — R9431 Abnormal electrocardiogram [ECG] [EKG]: Secondary | ICD-10-CM | POA: Diagnosis not present

## 2018-03-08 DIAGNOSIS — Z01818 Encounter for other preprocedural examination: Secondary | ICD-10-CM | POA: Insufficient documentation

## 2018-03-08 DIAGNOSIS — I444 Left anterior fascicular block: Secondary | ICD-10-CM | POA: Diagnosis not present

## 2018-03-08 DIAGNOSIS — J9811 Atelectasis: Secondary | ICD-10-CM | POA: Diagnosis not present

## 2018-03-08 DIAGNOSIS — Z01812 Encounter for preprocedural laboratory examination: Secondary | ICD-10-CM | POA: Diagnosis not present

## 2018-03-08 DIAGNOSIS — Z01810 Encounter for preprocedural cardiovascular examination: Secondary | ICD-10-CM | POA: Diagnosis not present

## 2018-03-08 HISTORY — DX: Malignant (primary) neoplasm, unspecified: C80.1

## 2018-03-08 HISTORY — DX: Other complications of anesthesia, initial encounter: T88.59XA

## 2018-03-08 HISTORY — DX: Adverse effect of unspecified anesthetic, initial encounter: T41.45XA

## 2018-03-08 HISTORY — DX: Dyspnea, unspecified: R06.00

## 2018-03-08 LAB — CBC WITH DIFFERENTIAL/PLATELET
BASOS ABS: 0.2 10*3/uL — AB (ref 0–0.1)
BASOS PCT: 3 %
Eosinophils Absolute: 0.6 10*3/uL (ref 0–0.7)
Eosinophils Relative: 7 %
HCT: 40.3 % (ref 40.0–52.0)
Hemoglobin: 14 g/dL (ref 13.0–18.0)
Lymphocytes Relative: 36 %
Lymphs Abs: 3 10*3/uL (ref 1.0–3.6)
MCH: 32.7 pg (ref 26.0–34.0)
MCHC: 34.6 g/dL (ref 32.0–36.0)
MCV: 94.5 fL (ref 80.0–100.0)
MONO ABS: 0.8 10*3/uL (ref 0.2–1.0)
MONOS PCT: 10 %
NEUTROS ABS: 3.7 10*3/uL (ref 1.4–6.5)
Neutrophils Relative %: 44 %
PLATELETS: 293 10*3/uL (ref 150–440)
RBC: 4.27 MIL/uL — ABNORMAL LOW (ref 4.40–5.90)
RDW: 14.4 % (ref 11.5–14.5)
WBC: 8.3 10*3/uL (ref 3.8–10.6)

## 2018-03-08 LAB — BASIC METABOLIC PANEL
ANION GAP: 8 (ref 5–15)
BUN: 21 mg/dL (ref 8–23)
CALCIUM: 10.7 mg/dL — AB (ref 8.9–10.3)
CO2: 29 mmol/L (ref 22–32)
CREATININE: 1.23 mg/dL (ref 0.61–1.24)
Chloride: 102 mmol/L (ref 98–111)
GFR, EST NON AFRICAN AMERICAN: 54 mL/min — AB (ref >60)
Glucose, Bld: 111 mg/dL — ABNORMAL HIGH (ref 70–99)
Potassium: 3.8 mmol/L (ref 3.5–5.1)
Sodium: 139 mmol/L (ref 135–145)

## 2018-03-08 LAB — PROTIME-INR
INR: 1
PROTHROMBIN TIME: 13.1 s (ref 11.4–15.2)

## 2018-03-08 LAB — SURGICAL PCR SCREEN
MRSA, PCR: NEGATIVE
STAPHYLOCOCCUS AUREUS: NEGATIVE

## 2018-03-08 LAB — APTT: APTT: 33 s (ref 24–36)

## 2018-03-08 LAB — URINALYSIS, ROUTINE W REFLEX MICROSCOPIC
Bilirubin Urine: NEGATIVE
GLUCOSE, UA: NEGATIVE mg/dL
HGB URINE DIPSTICK: NEGATIVE
Ketones, ur: NEGATIVE mg/dL
LEUKOCYTES UA: NEGATIVE
Nitrite: NEGATIVE
PH: 5 (ref 5.0–8.0)
PROTEIN: NEGATIVE mg/dL
Specific Gravity, Urine: 1.006 (ref 1.005–1.030)

## 2018-03-08 NOTE — Patient Instructions (Signed)
Your procedure is scheduled on: Monday, March 18, 2018  Report to [place].    DO NOT STOP ON THE FIRST FLOOR TO REGISTER   To find out your arrival time please call [phone] between 1PM - 3PM on Friday, March 15, 2018  Remember: Instructions that are not followed completely may result in serious medical risk,  up to and including death, or upon the discretion of your surgeon and anesthesiologist your  surgery may need to be rescheduled.     _X__ 1. Do not eat food after midnight the night before your procedure.                 No gum chewing or hard candies. ABSOLUTELY NOTHING SOLID IN YOUR MOUTH AFTER MIDNIGHT                  You may drink clear liquids up to 2 hours before you are scheduled to arrive for your surgery-                  DO not drink clear liquids within 2 hours of the start of your surgery.                  Clear Liquids include:  water, apple juice without pulp, clear carbohydrate                 drink such as Clearfast of Gatorade, Black Coffee or Tea (Do not add                 anything to coffee or tea).  __X__2.  On the morning of surgery brush your teeth with toothpaste and water,                  You may rinse your mouth with mouthwash if you wish.                    Do not swallow any toothpaste of mouthwash.     _X__ 3.  No Alcohol for 24 hours before or after surgery.   _X__ 4.  Do Not Smoke or use e-cigarettes For 24 Hours Prior to Your Surgery.                 Do not use any chewable tobacco products for at least 6 hours prior to                 surgery.  ____  5.  Bring all medications with you on the day of surgery if instructed.   __X__  6.  Notify your doctor if there is any change in your medical condition      (cold, fever, infections).     Do not wear jewelry, make-up, hairpins, clips or nail polish. Do not wear lotions, powders, or perfumes. You may wear deodorant. Do not shave 48  hours prior to surgery. Men may shave face and neck. Do not bring valuables to the hospital.    [facility] is not responsible for any belongings or valuables.  Contacts, dentures or bridgework may not be worn into surgery. Leave your suitcase in the car. After surgery it may be brought to your room. For patients admitted to the hospital, discharge time is determined by your treatment team.   Patients discharged the day of surgery will not be allowed to drive home.   Please read over the following fact sheets that you were given:  PREPARING FOR SURGERY               MRSA; STOP THE SPREAD   _X___ Take these medicines the morning of surgery with A SIP OF WATER:    1. FENOFIBRATE  2. ZETIA  3. PROSCAR  4.   5.  6.  ____ Fleet Enema (as directed)   __X__ Use CHG Soap as directed  __X__ Stop ALL ASPIRIN PRODUCTS AS OF July 15TH  __X__ Stop Anti-inflammatories AS OF July 15TH  _X___ Stop supplements until after surgery.  AS OF July 15              THIS INCLUDES MULTIVITS / OCUVITES / ALLI  _X___ Bring C-Pap to the hospital.             BRING IT IN WITH YOU WHEN YOU COME IN FOR SURGERY  CONTINUE TO TAKE AS USUAL,BUT NOT ON THE DAY OF SURGERY"      LASIX / POTASSIUM / LOVAZA / FLOMAX(TAKE AT NIGHT) / SYNTHROID (TAKE AT NIGHT) / COLACE / TYLENOL PM (TAKE AT NIGHT)  [place]

## 2018-03-11 ENCOUNTER — Other Ambulatory Visit: Payer: Self-pay | Admitting: Family Medicine

## 2018-03-15 ENCOUNTER — Ambulatory Visit: Payer: Medicare Other

## 2018-03-15 ENCOUNTER — Ambulatory Visit: Admission: RE | Admit: 2018-03-15 | Payer: Medicare Other | Source: Ambulatory Visit

## 2018-03-18 ENCOUNTER — Encounter: Admission: RE | Payer: Self-pay | Source: Ambulatory Visit

## 2018-03-18 ENCOUNTER — Inpatient Hospital Stay: Admission: RE | Admit: 2018-03-18 | Payer: Medicare Other | Source: Ambulatory Visit | Admitting: Neurosurgery

## 2018-03-18 SURGERY — SPINAL CORD STIMULATOR BATTERY EXCHANGE
Anesthesia: Choice

## 2018-04-04 ENCOUNTER — Encounter: Payer: Self-pay | Admitting: Family Medicine

## 2018-04-04 ENCOUNTER — Encounter: Payer: Medicare Other | Admitting: Student in an Organized Health Care Education/Training Program

## 2018-04-12 ENCOUNTER — Other Ambulatory Visit: Payer: Self-pay | Admitting: Family Medicine

## 2018-05-15 ENCOUNTER — Telehealth: Payer: Self-pay | Admitting: Internal Medicine

## 2018-05-15 NOTE — Telephone Encounter (Signed)
Routing to Dr Caryl Comes for clearance.

## 2018-05-15 NOTE — Telephone Encounter (Signed)
° °  [facility] Pre-operative Risk Assessment    Request for surgical clearance:  1. What type of surgery is being performed? Multiple extractions, periodontal cleaning and or fillings    2. When is this surgery scheduled? TBA   3. What type of clearance is required (medical clearance vs. Pharmacy clearance to hold med vs. Both)? Medical   4. Are there any medications that need to be held prior to surgery and how long?  5. Practice name and name of physician performing surgery?  [facility] Dr Kerby Moors   6. What is your office phone number [phone]   7.   What is your office fax number [phone]  8.   Anesthesia type (None, local, MAC, general) ?

## 2018-05-16 NOTE — Telephone Encounter (Signed)
S/w patient. Denies any new symptoms such as chest pain or shortness of breath. He is due for 6 month follow up in October. Patient scheduled to see Dr Caryl Comes in October. This phone note routed to dental office for clearance.

## 2018-05-16 NOTE — Telephone Encounter (Signed)
If symptoms are stable, no contraindication from cardiac perspective for oral surgery

## 2018-05-16 NOTE — Telephone Encounter (Signed)
Clearance form filled out by Dr. Caryl Comes and faxed to [place] at [phone]. Confirmation received.

## 2018-05-19 ENCOUNTER — Other Ambulatory Visit: Payer: Self-pay | Admitting: Family Medicine

## 2018-05-19 DIAGNOSIS — N183 Chronic kidney disease, stage 3 unspecified: Secondary | ICD-10-CM

## 2018-05-19 DIAGNOSIS — E782 Mixed hyperlipidemia: Secondary | ICD-10-CM

## 2018-05-21 ENCOUNTER — Other Ambulatory Visit: Payer: Self-pay | Admitting: Family Medicine

## 2018-05-21 ENCOUNTER — Other Ambulatory Visit (INDEPENDENT_AMBULATORY_CARE_PROVIDER_SITE_OTHER): Payer: Medicare Other

## 2018-05-21 DIAGNOSIS — N183 Chronic kidney disease, stage 3 unspecified: Secondary | ICD-10-CM

## 2018-05-21 DIAGNOSIS — E782 Mixed hyperlipidemia: Secondary | ICD-10-CM | POA: Diagnosis not present

## 2018-05-21 LAB — RENAL FUNCTION PANEL
Albumin: 4.2 g/dL (ref 3.5–5.2)
BUN: 26 mg/dL — AB (ref 6–23)
CO2: 31 mEq/L (ref 19–32)
CREATININE: 1.25 mg/dL (ref 0.40–1.50)
Calcium: 11 mg/dL — ABNORMAL HIGH (ref 8.4–10.5)
Chloride: 101 mEq/L (ref 96–112)
GFR: 58.98 mL/min — ABNORMAL LOW (ref >60.00)
GLUCOSE: 102 mg/dL — AB (ref 70–99)
PHOSPHORUS: 2.7 mg/dL (ref 2.3–4.6)
Potassium: 3.8 mEq/L (ref 3.5–5.1)
Sodium: 138 mEq/L (ref 135–145)

## 2018-05-21 LAB — LIPID PANEL
CHOL/HDL RATIO: 4
Cholesterol: 186 mg/dL (ref 0–200)
HDL: 42.5 mg/dL (ref >39.00)
NONHDL: 143.34
TRIGLYCERIDES: 262 mg/dL — AB (ref 0.0–149.0)
VLDL: 52.4 mg/dL — ABNORMAL HIGH (ref 0.0–40.0)

## 2018-05-21 LAB — VITAMIN D 25 HYDROXY (VIT D DEFICIENCY, FRACTURES): VITD: 27.44 ng/mL — AB (ref 30.00–100.00)

## 2018-05-21 LAB — LDL CHOLESTEROL, DIRECT: Direct LDL: 96 mg/dL

## 2018-05-22 ENCOUNTER — Other Ambulatory Visit: Payer: Self-pay | Admitting: Family Medicine

## 2018-05-22 MED ORDER — VITAMIN D3 25 MCG (1000 UT) PO CAPS
1.0000 | ORAL_CAPSULE | Freq: Every day | ORAL | Status: DC
Start: 2018-05-22 — End: 2022-06-27

## 2018-05-23 ENCOUNTER — Ambulatory Visit (INDEPENDENT_AMBULATORY_CARE_PROVIDER_SITE_OTHER): Payer: Medicare Other

## 2018-05-23 DIAGNOSIS — Z23 Encounter for immunization: Secondary | ICD-10-CM | POA: Diagnosis not present

## 2018-06-02 ENCOUNTER — Other Ambulatory Visit: Payer: Self-pay | Admitting: Family Medicine

## 2018-06-05 DIAGNOSIS — Z1283 Encounter for screening for malignant neoplasm of skin: Secondary | ICD-10-CM | POA: Diagnosis not present

## 2018-06-05 DIAGNOSIS — L918 Other hypertrophic disorders of the skin: Secondary | ICD-10-CM | POA: Diagnosis not present

## 2018-06-05 DIAGNOSIS — D692 Other nonthrombocytopenic purpura: Secondary | ICD-10-CM | POA: Diagnosis not present

## 2018-06-05 DIAGNOSIS — L821 Other seborrheic keratosis: Secondary | ICD-10-CM | POA: Diagnosis not present

## 2018-06-05 DIAGNOSIS — L219 Seborrheic dermatitis, unspecified: Secondary | ICD-10-CM | POA: Diagnosis not present

## 2018-06-05 DIAGNOSIS — L570 Actinic keratosis: Secondary | ICD-10-CM | POA: Diagnosis not present

## 2018-06-05 DIAGNOSIS — I788 Other diseases of capillaries: Secondary | ICD-10-CM | POA: Diagnosis not present

## 2018-06-05 DIAGNOSIS — D1800 Hemangioma unspecified site: Secondary | ICD-10-CM | POA: Diagnosis not present

## 2018-06-05 DIAGNOSIS — L578 Other skin changes due to chronic exposure to nonionizing radiation: Secondary | ICD-10-CM | POA: Diagnosis not present

## 2018-06-13 ENCOUNTER — Ambulatory Visit (INDEPENDENT_AMBULATORY_CARE_PROVIDER_SITE_OTHER): Payer: Medicare Other | Admitting: Internal Medicine

## 2018-06-13 ENCOUNTER — Encounter: Payer: Self-pay | Admitting: Internal Medicine

## 2018-06-13 VITALS — BP 126/90 | HR 81 | Ht 72.0 in | Wt 257.2 lb

## 2018-06-13 DIAGNOSIS — I5030 Unspecified diastolic (congestive) heart failure: Secondary | ICD-10-CM | POA: Diagnosis not present

## 2018-06-13 DIAGNOSIS — I2510 Atherosclerotic heart disease of native coronary artery without angina pectoris: Secondary | ICD-10-CM | POA: Diagnosis not present

## 2018-06-13 DIAGNOSIS — I483 Typical atrial flutter: Secondary | ICD-10-CM | POA: Diagnosis not present

## 2018-06-13 NOTE — Progress Notes (Signed)
Patient Care Team: Ria Bush, MD as PCP - General (Family Medicine) Gillis Santa, MD as Consulting Physician (Pain Medicine) Deboraha Sprang, MD as Consulting Physician (Cardiology)   HPI  Anthony [name] is a 82 y.o. male is the  father of [name] with a history of atrial flutter for which he underwent catheter ablation 9/18.   He has a history of coronary artery disease with prior stenting of his LAD and his RCA in 2013.  He does With intact not recall symptoms prior to the intervention; he remembers having had an ultrasound.  He has lost 40 pounds.  Exertional shortness of breath is less.  No significant chest pains.  No edema.  Echocardiogram 6/18 demonstrated normal LV function and normal left atrial size  Thromboembolic risk factors ( age -24, HTN-1 , Vasc disease -1) for a CHADSVASc Score of 4    Date Cr K Hgb  9/18   14.4  2/19 1.45 4.3    9/19  1.25 3.8 14.0   DATE TEST EF   6/18 Echo   55-65 %   4/19 MYOVIEW  57 % Non Ischemic           Past Medical History:  Diagnosis Date  . Anosmia 1980s  . Atrial flutter ([place]) 2018   had an ablation with dr. Erick Alley  . CAD (coronary artery disease)   . Cancer ([place])    skin cancer  . Chronic constipation   . Chronic insomnia   . Chronic lower back pain    s/p spine stimulator placement  . Complication of anesthesia    when under general, he woke up agitated and unable to be held down  . Diastolic CHF ([place])   . Dyspnea    had prior to having cardiac stent placed  . History of diverticulitis 2017  . History of pneumonia 2013  . Hyperlipidemia   . Hypertension   . Hypothyroidism   . Obesity, Class II, BMI 35-39.9, with comorbidity   . OSA on CPAP     Past Surgical History:  Procedure Laterality Date  . A-FLUTTER ABLATION N/A 05/07/2017   Procedure: A-Flutter Ablation;  Surgeon: Deboraha Sprang, MD;  Location: [facility];  Service: Cardiovascular;  Laterality: N/A;  . CARDIOVASCULAR  STRESS TEST  11/2017   no ischemia, low risk study  . COLONOSCOPY  2007   normal per prior PCP records, rpt 10 yrs (Dr Osie Cheeks)  . ELBOW SURGERY Right 2015   ulnar nerve decompression.  PT DOES NOT RECALL THIS PROCEDURE  . NASAL SINUS SURGERY     nasal polyps. done a long time ago  . PERCUTANEOUS CORONARY STENT INTERVENTION (PCI-S)  2013   EF55%, 70% mid LAD, 99% mid RCA, mild MR, elev LVEDP, DES to mid LAD. RCA is nondominant  . RADIOFREQUENCY ABLATION  06/2017   lumbar region [facility])   . REPLACEMENT TOTAL KNEE BILATERAL Bilateral 2000s  . SPINAL CORD STIMULATOR IMPLANT  06/2016    Current Meds  Medication Sig  . aspirin 81 MG tablet Take 81 mg by mouth daily.  . Cholecalciferol (VITAMIN D3) 1000 units CAPS Take 1 capsule (1,000 Units total) by mouth daily.  . diphenhydramine-acetaminophen (TYLENOL PM EXTRA STRENGTH) 25-500 MG TABS tablet Take 1 tablet by mouth at bedtime as needed. (Patient taking differently: Take 1 tablet by mouth at bedtime as needed (for sleep.). )  . docusate sodium (COLACE) 100 MG capsule Take 100 mg  by mouth 2 (two) times daily.  . Eszopiclone 3 MG TABS Take 3 mg by mouth at bedtime as needed. Take immediately before bedtime   . ezetimibe (ZETIA) 10 MG tablet TAKE 1 TABLET(10 MG) BY MOUTH DAILY  . fenofibrate 160 MG tablet TAKE 1 TABLET BY MOUTH EVERY DAY  . finasteride (PROSCAR) 5 MG tablet Take 1 tablet (5 mg total) by mouth daily.  . furosemide (LASIX) 40 MG tablet TAKE 1 TABLET BY MOUTH EVERY DAY. TAKE AN EXTRA TABLET AS NEEDED FOR WEIGHT GAIN OF>3LB/DAY OR 5LB/WEEK  . HYDROcodone-acetaminophen (NORCO/VICODIN) 5-325 MG tablet Take 1 tablet by mouth 3 (three) times daily as needed for moderate pain. For chronic pain Ok to fill 1 day earlier if pharmacy closed on fill date  . ketoconazole (NIZORAL) 2 % cream Apply 1 application topically daily as needed (for dermatitits).   Marland Kitchen levothyroxine (SYNTHROID, LEVOTHROID) 200 MCG tablet TAKE 1 TABLET BY MOUTH EVERY  DAY BEFORE BREAKFAST  . linaclotide (LINZESS) 290 MCG CAPS capsule Take 1 capsule (290 mcg total) by mouth daily. Take on an empty stomach at least 30 minutes before 1st meal of the day  . lisinopril (PRINIVIL,ZESTRIL) 10 MG tablet TAKE 1 TABLET BY MOUTH DAILY  . loratadine (CLARITIN) 10 MG tablet Take 1 tablet (10 mg total) by mouth daily.  . Multiple Vitamin (MULTIVITAMIN) capsule Take 1 capsule by mouth daily.  . multivitamin-lutein (OCUVITE-LUTEIN) CAPS capsule Take 1 capsule by mouth daily.  . nitroGLYCERIN (NITROSTAT) 0.4 MG SL tablet Place 1 tablet (0.4 mg total) under the tongue every 5 (five) minutes as needed for chest pain.  Marland Kitchen omega-3 acid ethyl esters (LOVAZA) 1 g capsule TAKE 2 CAPSULES BY MOUTH TWICE DAILY  . orlistat (ALLI) 60 MG capsule Take 1 capsule (60 mg total) by mouth daily as needed (diarrhea).  . potassium chloride (K-DUR,KLOR-CON) 10 MEQ tablet Take 1 tablet (10 mEq total) by mouth daily.  . potassium chloride (K-DUR,KLOR-CON) 10 MEQ tablet TAKE 1 TABLET(10 MEQ) BY MOUTH TWICE DAILY  . Psyllium (METAMUCIL FIBER) 51.7 % PACK Take 5-10 mLs by mouth daily.  Marland Kitchen senna (SENOKOT) 8.6 MG TABS tablet Take 3 tablets (25.8 mg total) by mouth daily as needed for moderate constipation.  . tamsulosin (FLOMAX) 0.4 MG CAPS capsule TAKE 2 CAPSULES BY MOUTH EVERY DAY  . tiZANidine (ZANAFLEX) 4 MG capsule Take 1 capsule (4 mg total) by mouth 2 (two) times daily as needed for muscle spasms.    Allergies  Allergen Reactions  . Questran [Cholestyramine]     Patient not aware of an allergy to this medicine.  . Atorvastatin     Muscle pain  . Gemfibrozil Other (See Comments)    Muscle pain.  . Metformin And Related     Dizziness  . Rosuvastatin     Muscle pain      Review of Systems negative except from HPI and PMH  Physical Exam BP 126/90 (BP Location: Left Arm, Patient Position: Sitting, Cuff Size: Normal)   Pulse 81   Ht 6' (1.829 m)   Wt 257 lb 4 oz (116.7 kg)   BMI 34.89  kg/m  Well developed and nourished in no acute distress HENT normal Neck supple with JVP-flat Clear Regular rate and rhythm, no murmurs or gallops Abd-soft with active BS No Clubbing cyanosis edema Skin-warm and dry A & Oriented  Grossly normal sensory and motor function  Sinus rhythm at 81 Intervals 18/10/38 Axis left -54   Assessment and Plan:  Atrial  flutter-typical  S/p ablation  HFpEF  Statin intolerance  Coronary artery disease with prior stenting      He is doing exceedingly well.  No interval chest pains  Weight loss is resulted in improved exercise tolerance.  His goal is 220 pounds we can donate his body to the [place].  Euvolemic continue current meds  We spent more than 50% of our >25 min visit in face to face counseling regarding the above    Virl Axe

## 2018-06-13 NOTE — Patient Instructions (Signed)
Medication Instructions:  - Your physician recommends that you continue on your current medications as directed. Please refer to the Current Medication list given to you today.  If you need a refill on your cardiac medications before your next appointment, please call your pharmacy.   Lab work: - none ordered  If you have labs (blood work) drawn today and your tests are completely normal, you will receive your results only by: . MyChart Message (if you have MyChart) OR . A paper copy in the mail If you have any lab test that is abnormal or we need to change your treatment, we will call you to review the results.  Testing/Procedures: - none ordered  Follow-Up: At [facility], you and your health needs are our priority.  As part of our continuing mission to provide you with exceptional heart care, we have created designated Provider Care Teams.  These Care Teams include your primary Cardiologist (physician) and Advanced Practice Providers (APPs -  Physician Assistants and Nurse Practitioners) who all work together to provide you with the care you need, when you need it. You will need a follow up appointment in 1 year with Dr. Klein.  Please call our office 2 months in advance to schedule this appointment.    Any Other Special Instructions Will Be Listed Below (If Applicable). - N/A   

## 2018-06-24 DIAGNOSIS — G894 Chronic pain syndrome: Secondary | ICD-10-CM | POA: Diagnosis not present

## 2018-07-15 ENCOUNTER — Other Ambulatory Visit: Payer: Self-pay | Admitting: Family Medicine

## 2018-07-23 ENCOUNTER — Encounter: Payer: Self-pay | Admitting: Family Medicine

## 2018-07-23 ENCOUNTER — Ambulatory Visit (INDEPENDENT_AMBULATORY_CARE_PROVIDER_SITE_OTHER): Payer: Medicare Other | Admitting: Family Medicine

## 2018-07-23 VITALS — BP 120/80 | HR 62 | Temp 98.0°F | Resp 14 | Ht 73.0 in | Wt 264.0 lb

## 2018-07-23 DIAGNOSIS — R42 Dizziness and giddiness: Secondary | ICD-10-CM

## 2018-07-23 DIAGNOSIS — R269 Unspecified abnormalities of gait and mobility: Secondary | ICD-10-CM

## 2018-07-23 NOTE — Progress Notes (Signed)
Subjective:     Anthony [name] is a 82 y.o. male presenting for Dizziness (Started on 07/21/18.. First day noticed sensation when he tried to stand up from sitting or laying position. His heart rate was fast and b/p low per patient on the first day. His son who is a doctor told patient to stop Flomax. Symptoms got better but then he noticed he started to have issues with walking, not walking in the straight line.)     Dizziness  This is a new problem. The current episode started in the past 7 days. The problem occurs constantly. The problem has been gradually improving. Associated symptoms include weakness. Pertinent negatives include no chills, congestion, coughing, fever, headaches, myalgias or visual change. Associated symptoms comments: tachycardia. The symptoms are aggravated by standing. Treatments tried: stopping Flomax. The treatment provided mild relief.   Son who is a doctor came over and told him to stop the Flomax to 1 time nightly  Monday - instability - difficulty walking straight - felt like his balance was "off" - seems to have gotten better - notices that he was walking right to left - has a walker that he doesn't use - noticed the symptoms all day long - staggering whenever walking - those symptoms  - no word finding difficulty or swallowing difficulty - has weakness in legs at baseline 2/2 to back pain - denies new weakness - no numbness - no sensation of dizziness on Monday   Review of Systems  Constitutional: Negative for chills and fever.  HENT: Negative for congestion.   Respiratory: Negative for cough.   Musculoskeletal: Negative for myalgias.  Neurological: Positive for dizziness and weakness. Negative for headaches.     Social History   Tobacco Use  Smoking Status Never Smoker  Smokeless Tobacco Never Used        Objective:    BP Readings from Last 3 Encounters:  07/23/18 120/80  06/13/18 126/90  03/08/18 120/86   Wt Readings from Last  3 Encounters:  07/23/18 264 lb (119.7 kg)  06/13/18 257 lb 4 oz (116.7 kg)  03/08/18 264 lb 5 oz (119.9 kg)    BP 120/80   Pulse 62   Temp 98 F (36.7 C)   Resp 14   Ht 6\' 1"  (1.854 m)   Wt 264 lb (119.7 kg)   SpO2 95%   BMI 34.83 kg/m      Physical Exam  Constitutional: He is oriented to person, place, and time. He appears well-developed and well-nourished. No distress.  HENT:  Head: Normocephalic and atraumatic.  Right Ear: External ear normal.  Left Ear: External ear normal.  Nose: Nose normal.  Eyes: Pupils are equal, round, and reactive to light. Conjunctivae and EOM are normal. No scleral icterus.  Neck: Normal range of motion. Neck supple.  Cardiovascular: Normal rate, regular rhythm and normal heart sounds.  Pulmonary/Chest: Effort normal and breath sounds normal. No stridor. No respiratory distress.  Neurological: He is alert and oriented to person, place, and time. He has normal strength. He displays tremor (Mild tremor in bilateral hands worse with movement). No cranial nerve deficit or sensory deficit. He exhibits normal muscle tone. Gait (Mild tilt to the right with initial steps) abnormal. Coordination normal.  Reflex Scores:      Tricep reflexes are 2+ on the right side and 2+ on the left side.      Bicep reflexes are 2+ on the right side and 2+ on the left side.  Achilles reflexes are 2+ on the right side and 2+ on the left side. Unable to obtain patellar 2/2 to bilateral knee replacement  Skin: Skin is warm and dry. Capillary refill takes less than 2 seconds. He is not diaphoretic.  Psychiatric: He has a normal mood and affect. His behavior is normal.          Assessment & Plan:   Problem List Items Addressed This Visit    None    Visit Diagnoses    Gait abnormality    -  Primary   Dizziness         Dizziness - seems to have resolved with reduction in flomax. Suspect some orthostatic component, though orthostatic vitals normal today.    Gait abnormality - neuro exam is reassuring and lack of focal deficit also reassuring but no sure what is causing symptoms. Could be MSK 2/2 to chronic back pain vs neurological. Discussed imaging, PT, and neurology referral but patient declined all three. Feels he will consider if symptoms persist but would like to watch and wait currently. Hx of CAD and currently on ASA.   Return if symptoms worsen or fail to improve.  Lesleigh Noe, MD

## 2018-07-23 NOTE — Patient Instructions (Addendum)
#  Dizziness - Continue the lower dose of Flomax - Monitor these symptoms  #Instability - we talked about the options of physical therapy to help with gait, neurology to further evaluate, or brain imaging to rule out stroke - Your neurological exam overall was reassuring - You felt that you did not want to pursue further work-up at this time.   #Sleep - I didn't see anything that jumped out specifically that could be causing sleep issues

## 2018-08-04 ENCOUNTER — Other Ambulatory Visit: Payer: Self-pay | Admitting: Family Medicine

## 2018-08-19 ENCOUNTER — Ambulatory Visit: Payer: Medicare Other

## 2018-08-19 ENCOUNTER — Ambulatory Visit
Admission: RE | Admit: 2018-08-19 | Discharge: 2018-08-19 | Disposition: A | Discharge status: Home / Self Care | Payer: Medicare Other | Source: Ambulatory Visit | Attending: Neurosurgery | Admitting: Neurosurgery

## 2018-08-19 DIAGNOSIS — M4804 Spinal stenosis, thoracic region: Secondary | ICD-10-CM | POA: Insufficient documentation

## 2018-08-19 DIAGNOSIS — M4807 Spinal stenosis, lumbosacral region: Secondary | ICD-10-CM | POA: Diagnosis not present

## 2018-08-19 DIAGNOSIS — M48061 Spinal stenosis, lumbar region without neurogenic claudication: Secondary | ICD-10-CM | POA: Diagnosis not present

## 2018-08-19 DIAGNOSIS — M2578 Osteophyte, vertebrae: Secondary | ICD-10-CM | POA: Insufficient documentation

## 2018-08-19 DIAGNOSIS — M5124 Other intervertebral disc displacement, thoracic region: Secondary | ICD-10-CM | POA: Insufficient documentation

## 2018-08-19 DIAGNOSIS — M5125 Other intervertebral disc displacement, thoracolumbar region: Secondary | ICD-10-CM | POA: Diagnosis not present

## 2018-08-19 DIAGNOSIS — T85192D Other mechanical complication of implanted electronic neurostimulator (electrode) of spinal cord, subsequent encounter: Secondary | ICD-10-CM

## 2018-08-19 DIAGNOSIS — M5136 Other intervertebral disc degeneration, lumbar region: Secondary | ICD-10-CM | POA: Insufficient documentation

## 2018-08-19 DIAGNOSIS — I714 Abdominal aortic aneurysm, without rupture: Secondary | ICD-10-CM | POA: Diagnosis not present

## 2018-08-19 DIAGNOSIS — X58XXXD Exposure to other specified factors, subsequent encounter: Secondary | ICD-10-CM | POA: Insufficient documentation

## 2018-08-19 DIAGNOSIS — M47814 Spondylosis without myelopathy or radiculopathy, thoracic region: Secondary | ICD-10-CM | POA: Diagnosis not present

## 2018-08-19 DIAGNOSIS — M5127 Other intervertebral disc displacement, lumbosacral region: Secondary | ICD-10-CM | POA: Diagnosis not present

## 2018-08-19 LAB — CBC
HCT: 40.1 % (ref 39.0–52.0)
Hemoglobin: 13.4 g/dL (ref 13.0–17.0)
MCH: 31.5 pg (ref 26.0–34.0)
MCHC: 33.4 g/dL (ref 30.0–36.0)
MCV: 94.1 fL (ref 80.0–100.0)
Platelets: 300 10*3/uL (ref 150–400)
RBC: 4.26 MIL/uL (ref 4.22–5.81)
RDW: 13.8 % (ref 11.5–15.5)
WBC: 8.2 10*3/uL (ref 4.0–10.5)
nRBC: 0 % (ref 0.0–0.2)

## 2018-08-19 LAB — APTT: APTT: 31 s (ref 24–36)

## 2018-08-19 LAB — PROTIME-INR
INR: 0.92
Prothrombin Time: 12.3 seconds (ref 11.4–15.2)

## 2018-08-19 MED ORDER — IOPAMIDOL (ISOVUE-M 300) INJECTION 61%
15.0000 mL | Freq: Once | INTRAMUSCULAR | Status: AC | PRN
  Administered 2018-08-19: 15 mL via INTRATHECAL

## 2018-08-19 MED ORDER — ACETAMINOPHEN 325 MG PO TABS
650.0000 mg | ORAL_TABLET | ORAL | Status: DC | PRN
  Filled 2018-08-19: qty 2

## 2018-08-19 NOTE — OR Nursing (Signed)
Patient discharged according to Radiologists orders. Instructions provided understanding verbalized.

## 2018-08-19 NOTE — Procedures (Signed)
Pt. Stable after myelogram.Back stable.D/C instructions given.F/U with his M.D.

## 2018-08-22 ENCOUNTER — Encounter
Admission: RE | Admit: 2018-08-22 | Discharge: 2018-08-22 | Disposition: A | Discharge status: Home / Self Care | Payer: Medicare Other | Source: Ambulatory Visit | Attending: Neurosurgery | Admitting: Neurosurgery

## 2018-08-22 ENCOUNTER — Other Ambulatory Visit: Payer: Self-pay | Admitting: Family Medicine

## 2018-08-22 ENCOUNTER — Other Ambulatory Visit: Payer: Self-pay

## 2018-08-22 DIAGNOSIS — Z01812 Encounter for preprocedural laboratory examination: Secondary | ICD-10-CM | POA: Insufficient documentation

## 2018-08-22 DIAGNOSIS — G8929 Other chronic pain: Secondary | ICD-10-CM | POA: Insufficient documentation

## 2018-08-22 LAB — TYPE AND SCREEN
ABO/RH(D): O POS
Antibody Screen: NEGATIVE

## 2018-08-22 LAB — BASIC METABOLIC PANEL
Anion gap: 8 (ref 5–15)
BUN: 26 mg/dL — ABNORMAL HIGH (ref 8–23)
CO2: 26 mmol/L (ref 22–32)
Calcium: 11 mg/dL — ABNORMAL HIGH (ref 8.9–10.3)
Chloride: 105 mmol/L (ref 98–111)
Creatinine, Ser: 1.26 mg/dL — ABNORMAL HIGH (ref 0.61–1.24)
GFR calc Af Amer: >60 mL/min (ref >60)
GFR calc non Af Amer: 54 mL/min — ABNORMAL LOW (ref >60)
Glucose, Bld: 113 mg/dL — ABNORMAL HIGH (ref 70–99)
Potassium: 3.7 mmol/L (ref 3.5–5.1)
SODIUM: 139 mmol/L (ref 135–145)

## 2018-08-22 MED ORDER — DEXTROSE 5 % IV SOLN
3.0000 g | Freq: Once | INTRAVENOUS | Status: DC
  Filled 2018-08-22: qty 3000

## 2018-08-22 MED ORDER — CEFAZOLIN (ANCEF) 1 G IV SOLR: 3.0000 g | INTRAVENOUS | Status: DC

## 2018-08-22 NOTE — Patient Instructions (Signed)
  Your procedure is scheduled on: Monday September 02, 2018 Report to Same Day Surgery 2nd floor [facility] Entr[facility]-take elevator on left to 2nd floor.  Check in with surgery information desk.) To find out your arrival time, call [phone] 1:00-3:00 PM on Friday August 30, 2018  Remember: Instructions that are not followed completely may result in serious medical risk, up to and including death, or upon the discretion of your surgeon and anesthesiologist your surgery may need to be rescheduled.    __x__ 1. Do not eat food (including mints, candies, chewing gum) after midnight the night before your procedure. You may drink clear liquids up to 2 hours before you are scheduled to arrive at the hospital for your procedure.  Do not drink anything within 2 hours of your scheduled arrival to the hospital.  Approved clear liquids:  --Water or Apple juice without pulp  --Clear carbohydrate beverage such as Gatorade or Powerade  --Black Coffee or Clear Tea (No milk, no creamers, do not add anything to the coffee or tea)    __x__ 2. No Alcohol for 24 hours before or after surgery.   __x__ 3. No Smoking or e-cigarettes for 24 hours before surgery.  Do not use any chewable tobacco products for at least 6 hours before surgery.   __x__ 4. Notify your doctor if there is any change in your medical condition (cold, fever, infections).   __x__ 5. On the morning of surgery brush your teeth with toothpaste and water.  You may rinse your mouth with mouthwash if you wish.  Do not swallow any toothpaste or mouthwash.  Please read over the following fact sheets that you were given:   [facility] Preparing for Surgery and/or MRSA Information    __x__ Use CHG Soap or Sage wipes as directed on instruction sheet   Do not wear jewelry on the day of surgery.  Do not wear lotions, powders, deodorant, or perfumes.   Do not shave below the face/neck 48 hours prior to surgery.   Do not bring valuables  to the hospital.    [facility] is not responsible for any belongings or valuables.               Contacts, dentures or bridgework may not be worn into surgery.  Leave your suitcase in the car. After surgery it may be brought to your room.  For patients admitted to the hospital, discharge time is determined by your treatment team.  For patients discharged on the day of surgery, you will NOT be permitted to drive yourself home.  You must have a responsible adult with you for 24 hours after surgery.  __x__ Take these medicines on the morning of surgery with a small sip of water:  1. Levothyroxine (Synthroid)   __x__ Bring C-Pap/Bi-Pap machine to the hospital.   __x__ Follow recommendations from Cardiologist, Pulmonologist or PCP regarding stopping Aspirin, Coumadin, Plavix, Eliquis, Effient, Pradaxa, and Pletal.  __x__ TODAY: Stop Anti-inflammatories such as Advil, Ibuprofen, Motrin, Aleve, Naproxen, Naprosyn, BC/Goodies powders or aspirin products. You may continue to take Tylenol and Celebrex.   __x__ TODAY: Stop supplements (Lovaza) until after surgery. You may continue to take Vitamin D, Vitamin B, and multivitamin.

## 2018-08-28 HISTORY — PX: LAMINECTOMY THORACIC SPINE W/ PLACEMENT SPINAL CORD STIMULATOR: SHX1917

## 2018-08-29 ENCOUNTER — Encounter
Admission: RE | Admit: 2018-08-29 | Discharge: 2018-08-29 | Disposition: A | Discharge status: Home / Self Care | Payer: Medicare Other | Source: Ambulatory Visit | Attending: Neurosurgery | Admitting: Neurosurgery

## 2018-08-29 ENCOUNTER — Ambulatory Visit
Admission: RE | Admit: 2018-08-29 | Discharge: 2018-08-29 | Disposition: A | Discharge status: Home / Self Care | Payer: Medicare Other | Source: Ambulatory Visit | Attending: Neurosurgery | Admitting: Neurosurgery

## 2018-08-29 DIAGNOSIS — Z01818 Encounter for other preprocedural examination: Secondary | ICD-10-CM | POA: Diagnosis not present

## 2018-08-29 LAB — URINALYSIS, ROUTINE W REFLEX MICROSCOPIC
Bilirubin Urine: NEGATIVE
Glucose, UA: NEGATIVE mg/dL
Hgb urine dipstick: NEGATIVE
Ketones, ur: NEGATIVE mg/dL
Leukocytes, UA: NEGATIVE
NITRITE: NEGATIVE
Protein, ur: NEGATIVE mg/dL
Specific Gravity, Urine: 1.009 (ref 1.005–1.030)
pH: 5 (ref 5.0–8.0)

## 2018-09-02 ENCOUNTER — Encounter
Admission: RE | Disposition: A | Discharge status: Home / Self Care | Payer: Self-pay | Source: Home / Self Care | Attending: Neurosurgery

## 2018-09-02 ENCOUNTER — Other Ambulatory Visit: Payer: Self-pay

## 2018-09-02 ENCOUNTER — Ambulatory Visit
Admission: RE | Admit: 2018-09-02 | Discharge: 2018-09-02 | Disposition: A | Discharge status: Home / Self Care | Payer: Medicare Other | Attending: Neurosurgery | Admitting: Neurosurgery

## 2018-09-02 ENCOUNTER — Encounter: Payer: Self-pay | Admitting: *Deleted

## 2018-09-02 ENCOUNTER — Encounter: Payer: Self-pay | Admitting: Anesthesiology

## 2018-09-02 DIAGNOSIS — I2510 Atherosclerotic heart disease of native coronary artery without angina pectoris: Secondary | ICD-10-CM | POA: Diagnosis not present

## 2018-09-02 DIAGNOSIS — I4892 Unspecified atrial flutter: Secondary | ICD-10-CM | POA: Insufficient documentation

## 2018-09-02 DIAGNOSIS — K219 Gastro-esophageal reflux disease without esophagitis: Secondary | ICD-10-CM | POA: Diagnosis not present

## 2018-09-02 DIAGNOSIS — G4733 Obstructive sleep apnea (adult) (pediatric): Secondary | ICD-10-CM | POA: Insufficient documentation

## 2018-09-02 DIAGNOSIS — E039 Hypothyroidism, unspecified: Secondary | ICD-10-CM | POA: Insufficient documentation

## 2018-09-02 DIAGNOSIS — Z6839 Body mass index (BMI) 39.0-39.9, adult: Secondary | ICD-10-CM | POA: Insufficient documentation

## 2018-09-02 DIAGNOSIS — E669 Obesity, unspecified: Secondary | ICD-10-CM | POA: Insufficient documentation

## 2018-09-02 DIAGNOSIS — Z87891 Personal history of nicotine dependence: Secondary | ICD-10-CM | POA: Insufficient documentation

## 2018-09-02 DIAGNOSIS — E785 Hyperlipidemia, unspecified: Secondary | ICD-10-CM | POA: Diagnosis not present

## 2018-09-02 DIAGNOSIS — M545 Low back pain: Secondary | ICD-10-CM | POA: Diagnosis not present

## 2018-09-02 DIAGNOSIS — I110 Hypertensive heart disease with heart failure: Secondary | ICD-10-CM | POA: Diagnosis not present

## 2018-09-02 DIAGNOSIS — I10 Essential (primary) hypertension: Secondary | ICD-10-CM | POA: Diagnosis not present

## 2018-09-02 DIAGNOSIS — Z955 Presence of coronary angioplasty implant and graft: Secondary | ICD-10-CM | POA: Diagnosis not present

## 2018-09-02 DIAGNOSIS — I5032 Chronic diastolic (congestive) heart failure: Secondary | ICD-10-CM | POA: Insufficient documentation

## 2018-09-02 DIAGNOSIS — Z85828 Personal history of other malignant neoplasm of skin: Secondary | ICD-10-CM | POA: Insufficient documentation

## 2018-09-02 DIAGNOSIS — G8929 Other chronic pain: Secondary | ICD-10-CM | POA: Insufficient documentation

## 2018-09-02 DIAGNOSIS — K5909 Other constipation: Secondary | ICD-10-CM | POA: Diagnosis not present

## 2018-09-02 SURGERY — LUMBAR SPINAL CORD SIMULATOR LEAD REMOVAL
Anesthesia: Choice

## 2018-09-02 MED ORDER — DEXTROSE 5 % IV SOLN
3.0000 g | Freq: Once | INTRAVENOUS | Status: DC
  Filled 2018-09-02: qty 3000

## 2018-09-02 MED ORDER — FENTANYL CITRATE (PF) 100 MCG/2ML IJ SOLN
INTRAMUSCULAR | Status: AC
  Filled 2018-09-02: qty 2

## 2018-09-02 MED ORDER — EPHEDRINE SULFATE 50 MG/ML IJ SOLN
INTRAMUSCULAR | Status: AC
  Filled 2018-09-02: qty 1

## 2018-09-02 MED ORDER — VANCOMYCIN HCL 10 G IV SOLR
1250.0000 mg | Freq: Once | INTRAVENOUS | Status: AC
  Administered 2018-09-02: 1250 mg via INTRAVENOUS
  Filled 2018-09-02: qty 1250

## 2018-09-02 MED ORDER — SUCCINYLCHOLINE CHLORIDE 20 MG/ML IJ SOLN
INTRAMUSCULAR | Status: AC
  Filled 2018-09-02: qty 1

## 2018-09-02 MED ORDER — LACTATED RINGERS IV SOLN
INTRAVENOUS | Status: DC
  Administered 2018-09-02: 07:00:00 via INTRAVENOUS

## 2018-09-02 MED ORDER — FAMOTIDINE 20 MG PO TABS
20.0000 mg | ORAL_TABLET | Freq: Once | ORAL | Status: AC
  Administered 2018-09-02: 20 mg via ORAL

## 2018-09-02 MED ORDER — PROPOFOL 500 MG/50ML IV EMUL
INTRAVENOUS | Status: AC
  Filled 2018-09-02: qty 50

## 2018-09-02 MED ORDER — LIDOCAINE HCL (PF) 2 % IJ SOLN
INTRAMUSCULAR | Status: AC
  Filled 2018-09-02: qty 10

## 2018-09-02 MED ORDER — ONDANSETRON HCL 4 MG/2ML IJ SOLN
INTRAMUSCULAR | Status: AC
  Filled 2018-09-02: qty 2

## 2018-09-02 MED ORDER — REMIFENTANIL HCL 1 MG IV SOLR
INTRAVENOUS | Status: AC
  Filled 2018-09-02: qty 1000

## 2018-09-02 MED ORDER — DEXAMETHASONE SODIUM PHOSPHATE 10 MG/ML IJ SOLN
INTRAMUSCULAR | Status: AC
  Filled 2018-09-02: qty 1

## 2018-09-02 MED ORDER — FAMOTIDINE 20 MG PO TABS
ORAL_TABLET | ORAL | Status: AC
  Administered 2018-09-02: 20 mg via ORAL
  Filled 2018-09-02: qty 1

## 2018-09-02 MED ORDER — SODIUM CHLORIDE FLUSH 0.9 % IV SOLN
INTRAVENOUS | Status: AC
  Filled 2018-09-02: qty 10

## 2018-09-02 MED ORDER — PHENYLEPHRINE HCL 10 MG/ML IJ SOLN
INTRAMUSCULAR | Status: AC
  Filled 2018-09-02: qty 1

## 2018-09-02 MED ORDER — GLYCOPYRROLATE 0.2 MG/ML IJ SOLN
INTRAMUSCULAR | Status: AC
  Filled 2018-09-02: qty 1

## 2018-09-02 MED ORDER — KETAMINE HCL 50 MG/ML IJ SOLN
INTRAMUSCULAR | Status: AC
  Filled 2018-09-02: qty 10

## 2018-09-02 SURGICAL SUPPLY — 63 items
BLADE BOVIE TIP EXT 4 (BLADE) ×2 IMPLANT
BUR NEURO DRILL SOFT 3.0X3.8M (BURR) ×2 IMPLANT
CANISTER SUCT 1200ML W/VALVE (MISCELLANEOUS) ×4 IMPLANT
CHLORAPREP W/TINT 26ML (MISCELLANEOUS) ×4 IMPLANT
CNTNR SPEC 2.5X3XGRAD LEK (MISCELLANEOUS) ×1
CONT SPEC 4OZ STER OR WHT (MISCELLANEOUS) ×1
CONTAINER SPEC 2.5X3XGRAD LEK (MISCELLANEOUS) ×1 IMPLANT
COUNTER NEEDLE 20/40 LG (NEEDLE) ×2 IMPLANT
COVER LIGHT HANDLE STERIS (MISCELLANEOUS) ×4 IMPLANT
COVER WAND RF STERILE (DRAPES) ×2 IMPLANT
CUP MEDICINE 2OZ PLAST GRAD ST (MISCELLANEOUS) ×2 IMPLANT
DERMABOND ADVANCED (GAUZE/BANDAGES/DRESSINGS) ×1
DERMABOND ADVANCED .7 DNX12 (GAUZE/BANDAGES/DRESSINGS) ×1 IMPLANT
DRAPE C-ARM 42X72 X-RAY (DRAPES) ×4 IMPLANT
DRAPE C-ARM XRAY 36X54 (DRAPES) ×4 IMPLANT
DRAPE LAPAROTOMY 100X77 ABD (DRAPES) ×2 IMPLANT
DRAPE MICROSCOPE SPINE 48X150 (DRAPES) IMPLANT
DRAPE POUCH INSTRU U-SHP 10X18 (DRAPES) ×2 IMPLANT
DRAPE SURG 17X11 SM STRL (DRAPES) ×2 IMPLANT
DURASEAL APPLICATOR TIP (TIP) IMPLANT
DURASEAL SPINE SEALANT 3ML (MISCELLANEOUS) IMPLANT
ELECT CAUTERY BLADE TIP 2.5 (TIP) ×2
ELECT EZSTD 165MM 6.5IN (MISCELLANEOUS) ×2
ELECT REM PT RETURN 9FT ADLT (ELECTROSURGICAL) ×2
ELECTRODE CAUTERY BLDE TIP 2.5 (TIP) ×1 IMPLANT
ELECTRODE EZSTD 165MM 6.5IN (MISCELLANEOUS) ×1 IMPLANT
ELECTRODE REM PT RTRN 9FT ADLT (ELECTROSURGICAL) ×1 IMPLANT
GAUZE SPONGE 4X4 12PLY STRL (GAUZE/BANDAGES/DRESSINGS) ×2 IMPLANT
GLOVE BIOGEL PI IND STRL 7.0 (GLOVE) ×1 IMPLANT
GLOVE BIOGEL PI IND STRL 8 (GLOVE) ×1 IMPLANT
GLOVE BIOGEL PI INDICATOR 7.0 (GLOVE) ×1
GLOVE BIOGEL PI INDICATOR 8 (GLOVE) ×1
GLOVE SURG SYN 6.5 ES PF (GLOVE) ×4 IMPLANT
GLOVE SURG SYN 8.0 (GLOVE) ×6 IMPLANT
GOWN STRL REUS W/ TWL LRG LVL3 (GOWN DISPOSABLE) ×1 IMPLANT
GOWN STRL REUS W/ TWL XL LVL3 (GOWN DISPOSABLE) ×1 IMPLANT
GOWN STRL REUS W/TWL LRG LVL3 (GOWN DISPOSABLE) ×1
GOWN STRL REUS W/TWL MED LVL3 (GOWN DISPOSABLE) ×2 IMPLANT
GOWN STRL REUS W/TWL XL LVL3 (GOWN DISPOSABLE) ×1
GRADUATE 1200CC STRL 31836 (MISCELLANEOUS) ×2 IMPLANT
KIT TURNOVER KIT A (KITS) ×2 IMPLANT
KIT WILSON FRAME (KITS) ×2 IMPLANT
KNIFE BAYONET SHORT DISCETOMY (MISCELLANEOUS) IMPLANT
MARKER SKIN DUAL TIP RULER LAB (MISCELLANEOUS) ×4 IMPLANT
NDL SAFETY ECLIPSE 18X1.5 (NEEDLE) ×1 IMPLANT
NEEDLE HYPO 18GX1.5 SHARP (NEEDLE) ×1
NEEDLE HYPO 22GX1.5 SAFETY (NEEDLE) ×2 IMPLANT
NS IRRIG 1000ML POUR BTL (IV SOLUTION) ×2 IMPLANT
PACK LAMINECTOMY NEURO (CUSTOM PROCEDURE TRAY) ×2 IMPLANT
PAD ARMBOARD 7.5X6 YLW CONV (MISCELLANEOUS) ×2 IMPLANT
SPOGE SURGIFLO 8M (HEMOSTASIS) ×1
SPONGE SURGIFLO 8M (HEMOSTASIS) ×1 IMPLANT
STAPLER SKIN PROX 35W (STAPLE) IMPLANT
SUT NURALON 4 0 TR CR/8 (SUTURE) IMPLANT
SUT POLYSORB 2-0 5X18 GS-10 (SUTURE) ×2 IMPLANT
SUT VIC AB 0 CT1 18XCR BRD 8 (SUTURE) ×2 IMPLANT
SUT VIC AB 0 CT1 8-18 (SUTURE) ×2
SYR 10ML LL (SYRINGE) ×4 IMPLANT
SYR 20CC LL (SYRINGE) ×2 IMPLANT
SYR 30ML LL (SYRINGE) ×4 IMPLANT
SYR 3ML LL SCALE MARK (SYRINGE) ×2 IMPLANT
TOWEL OR 17X26 4PK STRL BLUE (TOWEL DISPOSABLE) ×8 IMPLANT
TUBING CONNECTING 10 (TUBING) ×2 IMPLANT

## 2018-09-02 NOTE — H&P (Signed)
History & Physical   Date of Service: 07/24/2016     Time: 12:32 PM  Chief Complaint: Back pain, failed SCS leads  History of Present Illness: Anthony [name] is here for evaluation of his spinal cord stimulator. He he had a placed in 2017 after a trial. I discussed the trial with him and he does not express that he got a significant amount of relief from the trial but did proceed with the surgery. He states he was getting stimulation bilaterally but this may not of been enough for his expectations. He denies any leg pain but has had chronic back pain that he says worsens with any movement or standing. It does get better with rest. He underwent some facet ablations recently but did not dispense any pain relief from these. They have attempted to reprogram his spinal cord stimulator, however he was unable to get one side to work and currently is unable to get relief from either of the leads. He had a myelogram that showed no stenosis and is here for removal of leads and paddle trial   Past Medical History:  Diagnosis Date  . GERD (gastroesophageal reflux disease)   . Hyperlipidemia   . Hypertension    Past Surgical History:  Procedure Laterality Date  . cyst removal from spine     "about 50 years ago"  . Hardware removal, excision of osteophyte and debridement of gouty tophus, right index dip joint  Right 05/17/2016   Dr.Poggi   . KNEE ARTHROSCOPY Right 11/2011  . Pin put in right hand index finger     Family History  Problem Relation Age of Onset  . No Known Problems Mother   . No Known Problems Father    Social History   Social History  . Marital status: Single    Spouse name: N/A  . Number of children: N/A  . Years of education: N/A   Social History Main Topics  . Smoking status: Former Smoker    Quit date: 11/26/1954  . Smokeless tobacco: Never Used  . Alcohol use 0.0 oz/week  . Drug use: No  . Sexual activity: Not Asked   Other Topics Concern  . None   Social History  Narrative  . None     No Known Allergies  Medications: Cannot display prior to admission medications because the patient has not been admitted in this contact.    Review of Systems:  General ROS: Negative Psychological ROS: Negative Ophthalmic ROS: Negative ENT ROS: Negative Hematological and Lymphatic ROS: Negative  Endocrine ROS: Negative Respiratory ROS: Negative Cardiovascular ROS: Negative Gastrointestinal ROS: Negative Genito-Urinary ROS: Negative Musculoskeletal ROS: Positive for back pain Neurological ROS: Negative for numbness, weakness, leg pain Dermatological ROS: Negative  Physical Exam: Temp:  [97.8 F (36.6 C)] 97.8 F (36.6 C) (01/06 0629) Pulse Rate:  [75] 75 (01/06 0629) Resp:  [18] 18 (01/06 0629) BP: (125)/(89) 125/89 (01/06 0629) SpO2:  [96 %] 96 % (01/06 0629) Temp (24hrs), Avg:97.8 F (36.6 C), Min:97.8 F (36.6 C), Max:97.8 F (36.6 C)  Weight: 62.2 kg (137 lb 3.2 oz) General appearance: Alert, cooperative, in no acute distress Head: Normocephalic, atraumatic Eyes: Normal, EOM intact Oropharynx: Moist without lesions CV: Regular rate Pulm: Normal effort, CTA Ext: No edema in LE bilaterally  Neurologic exam:  Mental status: alertness: alert, affect: normal Speech: fluent and clear Motor:strength symmetric 5/5 in bilateral hip flexion, knee flexion, knee extension, dorsiflexion, plantarflexion Sensory: intact to light touch in bilateral lower extremities Gait: normal  Assessment & Plan:  Active Problems:   Anthony [name] is here for the SCS revision and removal.   1. Will proceed with surgery today  VTE Prophylaxis: SCDs  Code Status: Full Code  Discharge Planning: Plan for home, admit for 23 hour obs  Malen Gauze, MD 07/24/2016

## 2018-09-02 NOTE — OR Nursing (Signed)
Dr. Lacinda Axon into see patient. Patient did not stop taking his ASA, last dose was yesterday. Dr. Lacinda Axon has informed patient that his procedure will need to be postponed until next week.

## 2018-09-02 NOTE — H&P (Signed)
Referring Physician:  No referring provider defined for this encounter.  Primary Physician:  Ria Bush, MD  Chief Complaint:  Back pain   History of Present Illness: Anthony [name] is a 83 y.o. male who presents for spina cord stimulator removal and replacement of new stimulator. He was seen by Dr. Lacinda Axon on 06/24/2018 with complaints of ongoing back pain despite reprogramming of current stimulator. Continues to complain of persistent back pain with occasional leg pain. Denies recent falls, lower extremity weakness/numbness/tingling.    Review of Systems:  A 10 point review of systems is negative, except for the pertinent positives and negatives detailed in the HPI.  Past Medical History: Past Medical History:  Diagnosis Date  . Anosmia 1980s  . Atrial flutter ([place]) 2018   had an ablation with dr. Erick Alley  . CAD (coronary artery disease)   . Cancer ([place])    skin cancer  . Chronic constipation   . Chronic insomnia   . Chronic lower back pain    s/p spine stimulator placement  . Complication of anesthesia    when under general, he woke up agitated and unable to be held down  . Diastolic CHF ([place])   . Dyspnea    had prior to having cardiac stent placed  . History of diverticulitis 2017  . History of pneumonia 2013  . Hyperlipidemia   . Hypertension   . Hypothyroidism   . Obesity, Class II, BMI 35-39.9, with comorbidity   . OSA on CPAP     Past Surgical History: Past Surgical History:  Procedure Laterality Date  . A-FLUTTER ABLATION N/A 05/07/2017   Procedure: A-Flutter Ablation;  Surgeon: Deboraha Sprang, MD;  Location: [facility];  Service: Cardiovascular;  Laterality: N/A;  . CARDIOVASCULAR STRESS TEST  11/2017   no ischemia, low risk study  . COLONOSCOPY  2007   normal per prior PCP records, rpt 10 yrs (Dr Osie Cheeks)  . ELBOW SURGERY Right    ulnar nerve decompression.  PT DOES NOT RECALL THIS PROCEDURE  . NASAL SINUS SURGERY     nasal polyps. done a long  time ago  . PERCUTANEOUS CORONARY STENT INTERVENTION (PCI-S)  2013   EF55%, 70% mid LAD, 99% mid RCA, mild MR, elev LVEDP, DES to mid LAD. RCA is nondominant  . RADIOFREQUENCY ABLATION  06/2017   lumbar region [facility])   . REPLACEMENT TOTAL KNEE BILATERAL Bilateral 2000s  . SPINAL CORD STIMULATOR IMPLANT  06/2016    Allergies: Allergies as of 08/12/2018 - Review Complete 07/23/2018  Allergen Reaction Noted  . Questran [cholestyramine]  04/09/2017  . Atorvastatin  04/09/2017  . Gemfibrozil Other (See Comments) 04/09/2017  . Metformin and related  04/09/2017  . Rosuvastatin  04/09/2017    Medications:  Current Facility-Administered Medications:  .  ceFAZolin (ANCEF) 3 g in dextrose 5 % 50 mL IVPB, 3 g, Intravenous, Once, Deetta Perla, MD .  lactated ringers infusion, , Intravenous, Continuous, Alvin Critchley, MD, Last Rate: 75 mL/hr at 09/02/18 [phone] .  vancomycin (VANCOCIN) 1,250 mg in sodium chloride 0.9 % 250 mL IVPB, 1,250 mg, Intravenous, Once, Deetta Perla, MD, Last Rate: 166.7 mL/hr at 09/02/18 0654, 1,250 mg at 09/02/18 0654   Social History: Social History   Tobacco Use  . Smoking status: Never Smoker  . Smokeless tobacco: Never Used  Substance Use Topics  . Alcohol use: No    Comment: QUIT DRINKING  . Drug use: No    Family Medical History: Family History  Problem Relation Age of Onset  . Stroke Mother   . Kidney disease Father   . Leukemia Brother   . Stroke Sister   . Diabetes Neg Hx     Physical Examination: Vitals:   09/02/18 0629  BP: 125/89  Pulse: 75  Resp: 18  Temp: 97.8 F (36.6 C)  SpO2: 96%     General: Patient is well developed, well nourished, calm, collected, and in no apparent distress. Psychiatric: Patient is non-anxious. Neck:   Supple.  Full range of motion. Respiratory: Patient is breathing without any difficulty. Clear to auscultation bilaterally.  Heart: RRR NEUROLOGICAL:  General: In no acute distress.   Awake, alert,  oriented to person, place, and time.  Facial tone is symmetric.   Strength: Side Biceps Triceps Deltoid Grip  R 5 5 5 5   L 5 5 5 5    Side Iliopsoas Quads Hamstring PF DF  R 5 5 5 5 5   L 5 5 5 5 5    Bilateral upper and lower extremity sensation is intact to light touch and pin prick.    Assessment and Plan: Anthony [name] is a pleasant 83 y.o. male with persistent pain despite SCS reprogramming. Presents today for SCS removal and replacement. Consent signed. All questions addressed.    Marin Olp, PA-C Dept. of Neurosurgery

## 2018-09-02 NOTE — OR Nursing (Signed)
Pre op instructions reviewed with patient and his girlfriend for reschedule for next week. They report they will remove ASA from his pill box when they get home today.

## 2018-09-02 NOTE — H&P (View-Only) (Signed)
History & Physical   Date of Service: 07/24/2016     Time: 12:32 PM  Chief Complaint: Back pain, failed SCS leads  History of Present Illness: Anthony [name] is here for evaluation of his spinal cord stimulator. He he had a placed in 2017 after a trial. I discussed the trial with him and he does not express that he got a significant amount of relief from the trial but did proceed with the surgery. He states he was getting stimulation bilaterally but this may not of been enough for his expectations. He denies any leg pain but has had chronic back pain that he says worsens with any movement or standing. It does get better with rest. He underwent some facet ablations recently but did not dispense any pain relief from these. They have attempted to reprogram his spinal cord stimulator, however he was unable to get one side to work and currently is unable to get relief from either of the leads. He had a myelogram that showed no stenosis and is here for removal of leads and paddle trial   Past Medical History:  Diagnosis Date  . GERD (gastroesophageal reflux disease)   . Hyperlipidemia   . Hypertension    Past Surgical History:  Procedure Laterality Date  . cyst removal from spine     "about 50 years ago"  . Hardware removal, excision of osteophyte and debridement of gouty tophus, right index dip joint  Right 05/17/2016   Dr.Poggi   . KNEE ARTHROSCOPY Right 11/2011  . Pin put in right hand index finger     Family History  Problem Relation Age of Onset  . No Known Problems Mother   . No Known Problems Father    Social History   Social History  . Marital status: Single    Spouse name: N/A  . Number of children: N/A  . Years of education: N/A   Social History Main Topics  . Smoking status: Former Smoker    Quit date: 11/26/1954  . Smokeless tobacco: Never Used  . Alcohol use 0.0 oz/week  . Drug use: No  . Sexual activity: Not Asked   Other Topics Concern  . None   Social History  Narrative  . None     No Known Allergies  Medications: Cannot display prior to admission medications because the patient has not been admitted in this contact.    Review of Systems:  General ROS: Negative Psychological ROS: Negative Ophthalmic ROS: Negative ENT ROS: Negative Hematological and Lymphatic ROS: Negative  Endocrine ROS: Negative Respiratory ROS: Negative Cardiovascular ROS: Negative Gastrointestinal ROS: Negative Genito-Urinary ROS: Negative Musculoskeletal ROS: Positive for back pain Neurological ROS: Negative for numbness, weakness, leg pain Dermatological ROS: Negative  Physical Exam: Temp:  [97.8 F (36.6 C)] 97.8 F (36.6 C) (01/06 0629) Pulse Rate:  [75] 75 (01/06 0629) Resp:  [18] 18 (01/06 0629) BP: (125)/(89) 125/89 (01/06 0629) SpO2:  [96 %] 96 % (01/06 0629) Temp (24hrs), Avg:97.8 F (36.6 C), Min:97.8 F (36.6 C), Max:97.8 F (36.6 C)  Weight: 62.2 kg (137 lb 3.2 oz) General appearance: Alert, cooperative, in no acute distress Head: Normocephalic, atraumatic Eyes: Normal, EOM intact Oropharynx: Moist without lesions CV: Regular rate Pulm: Normal effort, CTA Ext: No edema in LE bilaterally  Neurologic exam:  Mental status: alertness: alert, affect: normal Speech: fluent and clear Motor:strength symmetric 5/5 in bilateral hip flexion, knee flexion, knee extension, dorsiflexion, plantarflexion Sensory: intact to light touch in bilateral lower extremities Gait: normal  Assessment & Plan:  Active Problems:   Mr. Barna is here for the SCS revision and removal.   1. Will proceed with surgery today  VTE Prophylaxis: SCDs  Code Status: Full Code  Discharge Planning: Plan for home, admit for 23 hour obs  Malen Gauze, MD 07/24/2016

## 2018-09-02 NOTE — H&P (View-Only) (Signed)
History & Physical   Date of Service: 07/24/2016     Time: 12:32 PM  Chief Complaint: Back pain, failed SCS leads  History of Present Illness: [name] is here for evaluation of his spinal cord stimulator. He he had a placed in 2017 after a trial. I discussed the trial with him and he does not express that he got a significant amount of relief from the trial but did proceed with the surgery. He states he was getting stimulation bilaterally but this may not of been enough for his expectations. He denies any leg pain but has had chronic back pain that he says worsens with any movement or standing. It does get better with rest. He underwent some facet ablations recently but did not dispense any pain relief from these. They have attempted to reprogram his spinal cord stimulator, however he was unable to get one side to work and currently is unable to get relief from either of the leads. He had a myelogram that showed no stenosis and is here for removal of leads and paddle trial   Past Medical History:  Diagnosis Date  . GERD (gastroesophageal reflux disease)   . Hyperlipidemia   . Hypertension    Past Surgical History:  Procedure Laterality Date  . cyst removal from spine     "about 50 years ago"  . Hardware removal, excision of osteophyte and debridement of gouty tophus, right index dip joint  Right 05/17/2016   Dr.Poggi   . KNEE ARTHROSCOPY Right 11/2011  . Pin put in right hand index finger     Family History  Problem Relation Age of Onset  . No Known Problems Mother   . No Known Problems Father    Social History   Social History  . Marital status: Single    Spouse name: N/A  . Number of children: N/A  . Years of education: N/A   Social History Main Topics  . Smoking status: Former Smoker    Quit date: 11/26/1954  . Smokeless tobacco: Never Used  . Alcohol use 0.0 oz/week  . Drug use: No  . Sexual activity: Not Asked   Other Topics Concern  . None   Social History  Narrative  . None     No Known Allergies  Medications: Cannot display prior to admission medications because the patient has not been admitted in this contact.    Review of Systems:  General ROS: Negative Psychological ROS: Negative Ophthalmic ROS: Negative ENT ROS: Negative Hematological and Lymphatic ROS: Negative  Endocrine ROS: Negative Respiratory ROS: Negative Cardiovascular ROS: Negative Gastrointestinal ROS: Negative Genito-Urinary ROS: Negative Musculoskeletal ROS: Positive for back pain Neurological ROS: Negative for numbness, weakness, leg pain Dermatological ROS: Negative  Physical Exam: Temp:  [97.8 F (36.6 C)] 97.8 F (36.6 C) (01/06 0629) Pulse Rate:  [75] 75 (01/06 0629) Resp:  [18] 18 (01/06 0629) BP: (125)/(89) 125/89 (01/06 0629) SpO2:  [96 %] 96 % (01/06 0629) Temp (24hrs), Avg:97.8 F (36.6 C), Min:97.8 F (36.6 C), Max:97.8 F (36.6 C)  Weight: 62.2 kg (137 lb 3.2 oz) General appearance: Alert, cooperative, in no acute distress Head: Normocephalic, atraumatic Eyes: Normal, EOM intact Oropharynx: Moist without lesions CV: Regular rate Pulm: Normal effort, CTA Ext: No edema in LE bilaterally  Neurologic exam:  Mental status: alertness: alert, affect: normal Speech: fluent and clear Motor:strength symmetric 5/5 in bilateral hip flexion, knee flexion, knee extension, dorsiflexion, plantarflexion Sensory: intact to light touch in bilateral lower extremities Gait: normal  Assessment & Plan:  Active Problems:   [name] is here for the SCS revision and removal.   1. Will proceed with surgery today  VTE Prophylaxis: SCDs  Code Status: Full Code  Discharge Planning: Plan for home, admit for 23 hour obs  Malen Gauze, MD 07/24/2016

## 2018-09-03 ENCOUNTER — Telehealth: Payer: Self-pay | Admitting: *Deleted

## 2018-09-03 NOTE — Telephone Encounter (Signed)
Sam with [facility] called stating that they need office notes from 10/18/17 and second face to face office notes along with a sleep study faxed to them. Sam said that they need this information regarding the patient's CPAP equipment.  Sam requested that this information be faxed to [phone].

## 2018-09-04 NOTE — Telephone Encounter (Signed)
Faxed info to [facility], of [facility], at [phone].

## 2018-09-04 NOTE — Telephone Encounter (Signed)
Printed office note and may send this phone note. From documentation in chart 04/29/2018: -AutoPAP range 6-14 (from office note 2018) -Reviewed compliance report dated 03/2018 - 100% compliance, AHI 1.3

## 2018-09-09 ENCOUNTER — Encounter: Payer: Self-pay | Admitting: *Deleted

## 2018-09-09 ENCOUNTER — Inpatient Hospital Stay: Payer: Medicare Other | Admitting: Certified Registered"

## 2018-09-09 ENCOUNTER — Other Ambulatory Visit: Payer: Self-pay

## 2018-09-09 ENCOUNTER — Observation Stay
Admission: RE | Admit: 2018-09-09 | Discharge: 2018-09-09 | Disposition: A | Discharge status: Home / Self Care | Payer: Medicare Other | Attending: Neurosurgery | Admitting: Neurosurgery

## 2018-09-09 ENCOUNTER — Encounter
Admission: RE | Disposition: A | Discharge status: Home / Self Care | Payer: Self-pay | Source: Home / Self Care | Attending: Neurosurgery

## 2018-09-09 ENCOUNTER — Inpatient Hospital Stay: Payer: Medicare Other

## 2018-09-09 ENCOUNTER — Observation Stay: Payer: Medicare Other

## 2018-09-09 DIAGNOSIS — I110 Hypertensive heart disease with heart failure: Secondary | ICD-10-CM | POA: Diagnosis not present

## 2018-09-09 DIAGNOSIS — Z419 Encounter for procedure for purposes other than remedying health state, unspecified: Secondary | ICD-10-CM

## 2018-09-09 DIAGNOSIS — G4733 Obstructive sleep apnea (adult) (pediatric): Secondary | ICD-10-CM | POA: Insufficient documentation

## 2018-09-09 DIAGNOSIS — Z969 Presence of functional implant, unspecified: Secondary | ICD-10-CM | POA: Diagnosis not present

## 2018-09-09 DIAGNOSIS — M4326 Fusion of spine, lumbar region: Secondary | ICD-10-CM | POA: Diagnosis not present

## 2018-09-09 DIAGNOSIS — Z79890 Hormone replacement therapy (postmenopausal): Secondary | ICD-10-CM | POA: Diagnosis not present

## 2018-09-09 DIAGNOSIS — I10 Essential (primary) hypertension: Secondary | ICD-10-CM | POA: Insufficient documentation

## 2018-09-09 DIAGNOSIS — I2510 Atherosclerotic heart disease of native coronary artery without angina pectoris: Secondary | ICD-10-CM | POA: Diagnosis not present

## 2018-09-09 DIAGNOSIS — I5030 Unspecified diastolic (congestive) heart failure: Secondary | ICD-10-CM | POA: Diagnosis not present

## 2018-09-09 DIAGNOSIS — Z6835 Body mass index (BMI) 35.0-35.9, adult: Secondary | ICD-10-CM | POA: Diagnosis not present

## 2018-09-09 DIAGNOSIS — G894 Chronic pain syndrome: Secondary | ICD-10-CM | POA: Diagnosis not present

## 2018-09-09 DIAGNOSIS — E039 Hypothyroidism, unspecified: Secondary | ICD-10-CM | POA: Insufficient documentation

## 2018-09-09 DIAGNOSIS — E669 Obesity, unspecified: Secondary | ICD-10-CM | POA: Insufficient documentation

## 2018-09-09 DIAGNOSIS — K219 Gastro-esophageal reflux disease without esophagitis: Secondary | ICD-10-CM | POA: Insufficient documentation

## 2018-09-09 DIAGNOSIS — E785 Hyperlipidemia, unspecified: Secondary | ICD-10-CM | POA: Insufficient documentation

## 2018-09-09 DIAGNOSIS — I130 Hypertensive heart and chronic kidney disease with heart failure and stage 1 through stage 4 chronic kidney disease, or unspecified chronic kidney disease: Secondary | ICD-10-CM | POA: Diagnosis not present

## 2018-09-09 DIAGNOSIS — Z9889 Other specified postprocedural states: Secondary | ICD-10-CM

## 2018-09-09 DIAGNOSIS — Z79899 Other long term (current) drug therapy: Secondary | ICD-10-CM | POA: Insufficient documentation

## 2018-09-09 DIAGNOSIS — Z85828 Personal history of other malignant neoplasm of skin: Secondary | ICD-10-CM | POA: Insufficient documentation

## 2018-09-09 DIAGNOSIS — Z87891 Personal history of nicotine dependence: Secondary | ICD-10-CM | POA: Diagnosis not present

## 2018-09-09 DIAGNOSIS — Z9689 Presence of other specified functional implants: Secondary | ICD-10-CM

## 2018-09-09 DIAGNOSIS — N183 Chronic kidney disease, stage 3 (moderate): Secondary | ICD-10-CM | POA: Diagnosis not present

## 2018-09-09 DIAGNOSIS — T85192A Other mechanical complication of implanted electronic neurostimulator (electrode) of spinal cord, initial encounter: Secondary | ICD-10-CM | POA: Diagnosis not present

## 2018-09-09 HISTORY — PX: SPINAL CORD STIMULATOR INSERTION: SHX5378

## 2018-09-09 LAB — TYPE AND SCREEN
ABO/RH(D): O POS
Antibody Screen: NEGATIVE

## 2018-09-09 LAB — PROTIME-INR
INR: 0.91
PROTHROMBIN TIME: 12.2 s (ref 11.4–15.2)

## 2018-09-09 SURGERY — INSERTION, SPINAL CORD STIMULATOR, LUMBAR
Anesthesia: General

## 2018-09-09 MED ORDER — LACTATED RINGERS IV SOLN
INTRAVENOUS | Status: DC
  Administered 2018-09-09: 07:00:00 via INTRAVENOUS

## 2018-09-09 MED ORDER — SODIUM CHLORIDE 0.9 % IR SOLN
Status: DC | PRN
  Administered 2018-09-09: 1000 mL

## 2018-09-09 MED ORDER — BUPIVACAINE-EPINEPHRINE (PF) 0.5% -1:200000 IJ SOLN
INTRAMUSCULAR | Status: DC | PRN
  Administered 2018-09-09: 20 mL

## 2018-09-09 MED ORDER — BACITRACIN 50000 UNITS IM SOLR
INTRAMUSCULAR | Status: AC
  Filled 2018-09-09: qty 1

## 2018-09-09 MED ORDER — LEVOTHYROXINE SODIUM 100 MCG PO TABS: 200.0000 ug | ORAL_TABLET | Freq: Every day | ORAL | Status: DC

## 2018-09-09 MED ORDER — GLYCOPYRROLATE 0.2 MG/ML IJ SOLN
INTRAMUSCULAR | Status: DC | PRN
  Administered 2018-09-09: 0.2 mg via INTRAVENOUS

## 2018-09-09 MED ORDER — LIDOCAINE HCL (PF) 2 % IJ SOLN
INTRAMUSCULAR | Status: AC
  Filled 2018-09-09: qty 10

## 2018-09-09 MED ORDER — KETAMINE HCL 50 MG/ML IJ SOLN
INTRAMUSCULAR | Status: AC
  Filled 2018-09-09: qty 10

## 2018-09-09 MED ORDER — DOCUSATE SODIUM 100 MG PO CAPS: 100.0000 mg | ORAL_CAPSULE | Freq: Two times a day (BID) | ORAL | Status: DC

## 2018-09-09 MED ORDER — PROPOFOL 500 MG/50ML IV EMUL
INTRAVENOUS | Status: AC
  Filled 2018-09-09: qty 50

## 2018-09-09 MED ORDER — SODIUM CHLORIDE 0.9% FLUSH: 3.0000 mL | Freq: Two times a day (BID) | INTRAVENOUS | Status: DC

## 2018-09-09 MED ORDER — ONDANSETRON HCL 4 MG/2ML IJ SOLN
INTRAMUSCULAR | Status: DC | PRN
  Administered 2018-09-09: 4 mg via INTRAVENOUS

## 2018-09-09 MED ORDER — TAMSULOSIN HCL 0.4 MG PO CAPS: 0.8000 mg | ORAL_CAPSULE | Freq: Every day | ORAL | Status: DC

## 2018-09-09 MED ORDER — TRAMADOL HCL 50 MG PO TABS
50.0000 mg | ORAL_TABLET | ORAL | Status: DC | PRN
  Filled 2018-09-09: qty 1

## 2018-09-09 MED ORDER — ONDANSETRON HCL 4 MG/2ML IJ SOLN: 4.0000 mg | Freq: Four times a day (QID) | INTRAMUSCULAR | Status: DC | PRN

## 2018-09-09 MED ORDER — MORPHINE SULFATE (PF) 2 MG/ML IV SOLN: 2.0000 mg | Freq: Four times a day (QID) | INTRAVENOUS | Status: DC | PRN

## 2018-09-09 MED ORDER — FINASTERIDE 5 MG PO TABS: 5.0000 mg | ORAL_TABLET | Freq: Every day | ORAL | Status: DC

## 2018-09-09 MED ORDER — SODIUM CHLORIDE (PF) 0.9 % IJ SOLN
INTRAMUSCULAR | Status: AC
  Filled 2018-09-09: qty 20

## 2018-09-09 MED ORDER — CYCLOBENZAPRINE HCL 5 MG PO TABS: 5.0000 mg | ORAL_TABLET | Freq: Three times a day (TID) | ORAL | 0 refills | Status: DC | PRN

## 2018-09-09 MED ORDER — LISINOPRIL 10 MG PO TABS: 10.0000 mg | ORAL_TABLET | Freq: Every day | ORAL | Status: DC

## 2018-09-09 MED ORDER — ACETAMINOPHEN 10 MG/ML IV SOLN
INTRAVENOUS | Status: DC | PRN
  Administered 2018-09-09: 1000 mg via INTRAVENOUS

## 2018-09-09 MED ORDER — MIDAZOLAM HCL 2 MG/2ML IJ SOLN
INTRAMUSCULAR | Status: AC
  Filled 2018-09-09: qty 2

## 2018-09-09 MED ORDER — FENTANYL CITRATE (PF) 100 MCG/2ML IJ SOLN
INTRAMUSCULAR | Status: AC
  Filled 2018-09-09: qty 2

## 2018-09-09 MED ORDER — SODIUM CHLORIDE 0.9 % IV SOLN: 250.0000 mL | INTRAVENOUS | Status: DC

## 2018-09-09 MED ORDER — MENTHOL 3 MG MT LOZG: 1.0000 | LOZENGE | OROMUCOSAL | Status: DC | PRN

## 2018-09-09 MED ORDER — SODIUM CHLORIDE 0.9 % IV SOLN: INTRAVENOUS | Status: DC

## 2018-09-09 MED ORDER — SODIUM CHLORIDE 0.9% FLUSH: 3.0000 mL | INTRAVENOUS | Status: DC | PRN

## 2018-09-09 MED ORDER — ONDANSETRON HCL 4 MG/2ML IJ SOLN: 4.0000 mg | Freq: Once | INTRAMUSCULAR | Status: DC | PRN

## 2018-09-09 MED ORDER — LIDOCAINE HCL (CARDIAC) PF 100 MG/5ML IV SOSY
PREFILLED_SYRINGE | INTRAVENOUS | Status: DC | PRN
  Administered 2018-09-09: 50 mg via INTRAVENOUS

## 2018-09-09 MED ORDER — SODIUM CHLORIDE FLUSH 0.9 % IV SOLN
INTRAVENOUS | Status: AC
  Filled 2018-09-09: qty 10

## 2018-09-09 MED ORDER — METHOCARBAMOL 1000 MG/10ML IJ SOLN
500.0000 mg | Freq: Four times a day (QID) | INTRAVENOUS | Status: DC | PRN
  Filled 2018-09-09: qty 5

## 2018-09-09 MED ORDER — FENTANYL CITRATE (PF) 100 MCG/2ML IJ SOLN: 25.0000 ug | INTRAMUSCULAR | Status: DC | PRN

## 2018-09-09 MED ORDER — SUCCINYLCHOLINE CHLORIDE 20 MG/ML IJ SOLN
INTRAMUSCULAR | Status: AC
  Filled 2018-09-09: qty 1

## 2018-09-09 MED ORDER — ONDANSETRON HCL 4 MG PO TABS: 4.0000 mg | ORAL_TABLET | Freq: Four times a day (QID) | ORAL | Status: DC | PRN

## 2018-09-09 MED ORDER — NITROGLYCERIN 0.4 MG SL SUBL: 0.4000 mg | SUBLINGUAL_TABLET | SUBLINGUAL | Status: DC | PRN

## 2018-09-09 MED ORDER — DEXAMETHASONE SODIUM PHOSPHATE 10 MG/ML IJ SOLN
INTRAMUSCULAR | Status: DC | PRN
  Administered 2018-09-09: 10 mg via INTRAVENOUS

## 2018-09-09 MED ORDER — TRAMADOL HCL 50 MG PO TABS: 50.0000 mg | ORAL_TABLET | ORAL | 0 refills | Status: DC | PRN

## 2018-09-09 MED ORDER — ACETAMINOPHEN 325 MG PO TABS: 650.0000 mg | ORAL_TABLET | ORAL | Status: DC | PRN

## 2018-09-09 MED ORDER — SENNOSIDES-DOCUSATE SODIUM 8.6-50 MG PO TABS: 1.0000 | ORAL_TABLET | Freq: Every evening | ORAL | Status: DC | PRN

## 2018-09-09 MED ORDER — SODIUM CHLORIDE 0.9 % IV SOLN
INTRAVENOUS | Status: DC | PRN
  Administered 2018-09-09: 50 ug/min via INTRAVENOUS

## 2018-09-09 MED ORDER — KETAMINE HCL 50 MG/ML IJ SOLN
INTRAMUSCULAR | Status: DC | PRN
  Administered 2018-09-09: 25 mg via INTRAVENOUS
  Administered 2018-09-09: 50 mg via INTRAVENOUS
  Administered 2018-09-09: 25 mg via INTRAVENOUS

## 2018-09-09 MED ORDER — PROPOFOL 500 MG/50ML IV EMUL
INTRAVENOUS | Status: DC | PRN
  Administered 2018-09-09: 150 ug/kg/min via INTRAVENOUS

## 2018-09-09 MED ORDER — LACTATED RINGERS IV SOLN
INTRAVENOUS | Status: DC | PRN
  Administered 2018-09-09: 07:00:00 via INTRAVENOUS

## 2018-09-09 MED ORDER — FAMOTIDINE 20 MG PO TABS
ORAL_TABLET | ORAL | Status: AC
  Administered 2018-09-09: 20 mg via ORAL
  Filled 2018-09-09: qty 1

## 2018-09-09 MED ORDER — FUROSEMIDE 40 MG PO TABS: 40.0000 mg | ORAL_TABLET | Freq: Every day | ORAL | Status: DC

## 2018-09-09 MED ORDER — PHENYLEPHRINE HCL 10 MG/ML IJ SOLN
INTRAMUSCULAR | Status: AC
  Filled 2018-09-09: qty 1

## 2018-09-09 MED ORDER — BUPIVACAINE-EPINEPHRINE (PF) 0.5% -1:200000 IJ SOLN
INTRAMUSCULAR | Status: AC
  Filled 2018-09-09: qty 30

## 2018-09-09 MED ORDER — MIDAZOLAM HCL 2 MG/2ML IJ SOLN
INTRAMUSCULAR | Status: DC | PRN
  Administered 2018-09-09: 2 mg via INTRAVENOUS

## 2018-09-09 MED ORDER — SUCCINYLCHOLINE CHLORIDE 20 MG/ML IJ SOLN
INTRAMUSCULAR | Status: DC | PRN
  Administered 2018-09-09: 100 mg via INTRAVENOUS

## 2018-09-09 MED ORDER — PROPOFOL 10 MG/ML IV BOLUS
INTRAVENOUS | Status: AC
  Filled 2018-09-09: qty 20

## 2018-09-09 MED ORDER — EPHEDRINE SULFATE 50 MG/ML IJ SOLN
INTRAMUSCULAR | Status: AC
  Filled 2018-09-09: qty 1

## 2018-09-09 MED ORDER — REMIFENTANIL HCL 1 MG IV SOLR
INTRAVENOUS | Status: DC | PRN
  Administered 2018-09-09: .1 ug/kg/min via INTRAVENOUS

## 2018-09-09 MED ORDER — FENTANYL CITRATE (PF) 100 MCG/2ML IJ SOLN
INTRAMUSCULAR | Status: DC | PRN
  Administered 2018-09-09 (×2): 100 ug via INTRAVENOUS

## 2018-09-09 MED ORDER — DEXAMETHASONE SODIUM PHOSPHATE 10 MG/ML IJ SOLN
INTRAMUSCULAR | Status: AC
  Filled 2018-09-09: qty 1

## 2018-09-09 MED ORDER — ACETAMINOPHEN 500 MG PO TABS: 1000.0000 mg | ORAL_TABLET | Freq: Four times a day (QID) | ORAL | Status: DC

## 2018-09-09 MED ORDER — ACETAMINOPHEN 650 MG RE SUPP: 650.0000 mg | RECTAL | Status: DC | PRN

## 2018-09-09 MED ORDER — REMIFENTANIL HCL 1 MG IV SOLR
INTRAVENOUS | Status: AC
  Filled 2018-09-09: qty 1000

## 2018-09-09 MED ORDER — METHOCARBAMOL 500 MG PO TABS: 500.0000 mg | ORAL_TABLET | Freq: Four times a day (QID) | ORAL | Status: DC | PRN

## 2018-09-09 MED ORDER — EPHEDRINE SULFATE 50 MG/ML IJ SOLN
INTRAMUSCULAR | Status: DC | PRN
  Administered 2018-09-09 (×3): 10 mg via INTRAVENOUS

## 2018-09-09 MED ORDER — ONDANSETRON HCL 4 MG/2ML IJ SOLN
INTRAMUSCULAR | Status: AC
  Filled 2018-09-09: qty 2

## 2018-09-09 MED ORDER — PHENYLEPHRINE HCL 10 MG/ML IJ SOLN
INTRAMUSCULAR | Status: DC | PRN
  Administered 2018-09-09 (×2): 100 ug via INTRAVENOUS

## 2018-09-09 MED ORDER — VANCOMYCIN HCL 1000 MG IV SOLR
INTRAVENOUS | Status: DC | PRN
  Administered 2018-09-09: 1000 mg via TOPICAL

## 2018-09-09 MED ORDER — FENOFIBRATE 160 MG PO TABS
160.0000 mg | ORAL_TABLET | Freq: Every day | ORAL | Status: DC
  Filled 2018-09-09: qty 1

## 2018-09-09 MED ORDER — LINACLOTIDE 290 MCG PO CAPS: 290.0000 ug | ORAL_CAPSULE | Freq: Every day | ORAL | Status: DC

## 2018-09-09 MED ORDER — ALBUTEROL SULFATE HFA 108 (90 BASE) MCG/ACT IN AERS
INHALATION_SPRAY | RESPIRATORY_TRACT | Status: AC
  Filled 2018-09-09: qty 6.7

## 2018-09-09 MED ORDER — FAMOTIDINE 20 MG PO TABS
20.0000 mg | ORAL_TABLET | Freq: Once | ORAL | Status: AC
  Administered 2018-09-09: 20 mg via ORAL

## 2018-09-09 MED ORDER — METHYLPREDNISOLONE ACETATE 40 MG/ML IJ SUSP
INTRAMUSCULAR | Status: AC
  Filled 2018-09-09: qty 1

## 2018-09-09 MED ORDER — PROPOFOL 10 MG/ML IV BOLUS
INTRAVENOUS | Status: DC | PRN
  Administered 2018-09-09: 150 mg via INTRAVENOUS
  Administered 2018-09-09: 50 mg via INTRAVENOUS

## 2018-09-09 MED ORDER — ACETAMINOPHEN 10 MG/ML IV SOLN
INTRAVENOUS | Status: AC
  Filled 2018-09-09: qty 100

## 2018-09-09 MED ORDER — GLYCOPYRROLATE 0.2 MG/ML IJ SOLN
INTRAMUSCULAR | Status: AC
  Filled 2018-09-09: qty 1

## 2018-09-09 MED ORDER — DEXTROSE 5 % IV SOLN
3.0000 g | Freq: Once | INTRAVENOUS | Status: AC
  Administered 2018-09-09: 3 g via INTRAVENOUS
  Filled 2018-09-09: qty 3

## 2018-09-09 MED ORDER — EZETIMIBE 10 MG PO TABS
10.0000 mg | ORAL_TABLET | Freq: Every day | ORAL | Status: DC
  Filled 2018-09-09: qty 1

## 2018-09-09 MED ORDER — PHENOL 1.4 % MT LIQD: 1.0000 | OROMUCOSAL | Status: DC | PRN

## 2018-09-09 MED ORDER — CEPHALEXIN 500 MG PO CAPS: 500.0000 mg | ORAL_CAPSULE | Freq: Two times a day (BID) | ORAL | 0 refills | Status: DC

## 2018-09-09 MED ORDER — VANCOMYCIN HCL 10 G IV SOLR
1750.0000 mg | Freq: Once | INTRAVENOUS | Status: AC
  Administered 2018-09-09: 1750 mg via INTRAVENOUS
  Filled 2018-09-09: qty 1750

## 2018-09-09 SURGICAL SUPPLY — 77 items
BLADE BOVIE TIP EXT 4 (BLADE) ×2 IMPLANT
BUR NEURO DRILL SOFT 3.0X3.8M (BURR) ×2 IMPLANT
CABLE MULTI-LEAD TRIAL SPINE (MISCELLANEOUS) ×2 IMPLANT
CANISTER SUCT 1200ML W/VALVE (MISCELLANEOUS) ×4 IMPLANT
CHLORAPREP W/TINT 26ML (MISCELLANEOUS) ×2 IMPLANT
CNTNR SPEC 2.5X3XGRAD LEK (MISCELLANEOUS) ×1
CONT SPEC 4OZ STER OR WHT (MISCELLANEOUS) ×1
CONTAINER SPEC 2.5X3XGRAD LEK (MISCELLANEOUS) ×1 IMPLANT
COUNTER NEEDLE 20/40 LG (NEEDLE) ×2 IMPLANT
COVER LIGHT HANDLE STERIS (MISCELLANEOUS) ×4 IMPLANT
COVER WAND RF STERILE (DRAPES) ×2 IMPLANT
CUP MEDICINE 2OZ PLAST GRAD ST (MISCELLANEOUS) ×2 IMPLANT
DERMABOND ADVANCED (GAUZE/BANDAGES/DRESSINGS) ×1
DERMABOND ADVANCED .7 DNX12 (GAUZE/BANDAGES/DRESSINGS) ×1 IMPLANT
DRAPE C-ARM XRAY 36X54 (DRAPES) ×4 IMPLANT
DRAPE LAPAROTOMY 100X77 ABD (DRAPES) ×2 IMPLANT
DRAPE MICROSCOPE SPINE 48X150 (DRAPES) IMPLANT
DRAPE POUCH INSTRU U-SHP 10X18 (DRAPES) ×2 IMPLANT
DRAPE SURG 17X11 SM STRL (DRAPES) ×2 IMPLANT
DRSG OPSITE POSTOP 3X4 (GAUZE/BANDAGES/DRESSINGS) ×6 IMPLANT
DRSG OPSITE POSTOP 4X6 (GAUZE/BANDAGES/DRESSINGS) ×2 IMPLANT
DURASEAL APPLICATOR TIP (TIP) IMPLANT
DURASEAL SPINE SEALANT 3ML (MISCELLANEOUS) IMPLANT
ELECT CAUTERY BLADE TIP 2.5 (TIP) ×2
ELECT EZSTD 165MM 6.5IN (MISCELLANEOUS) ×2
ELECT REM PT RETURN 9FT ADLT (ELECTROSURGICAL) ×2
ELECTRODE CAUTERY BLDE TIP 2.5 (TIP) ×1 IMPLANT
ELECTRODE EZSTD 165MM 6.5IN (MISCELLANEOUS) ×1 IMPLANT
ELECTRODE REM PT RTRN 9FT ADLT (ELECTROSURGICAL) ×1 IMPLANT
EXTENSION SPINE 60 (Miscellaneous) ×4 IMPLANT
EXTERNAL PULSE GENERATOR, 2 PORT HEADER ×2 IMPLANT
FEE INTRAOP MONITOR IMPULS NCS (MISCELLANEOUS) ×1 IMPLANT
GAUZE SPONGE 4X4 12PLY STRL (GAUZE/BANDAGES/DRESSINGS) IMPLANT
GLOVE BIOGEL PI IND STRL 7.0 (GLOVE) ×1 IMPLANT
GLOVE BIOGEL PI IND STRL 8 (GLOVE) ×1 IMPLANT
GLOVE BIOGEL PI INDICATOR 7.0 (GLOVE) ×1
GLOVE BIOGEL PI INDICATOR 8 (GLOVE) ×1
GLOVE SURG SYN 6.5 ES PF (GLOVE) ×2 IMPLANT
GLOVE SURG SYN 8.0 (GLOVE) ×2 IMPLANT
GOWN STRL REUS W/ TWL LRG LVL3 (GOWN DISPOSABLE) ×1 IMPLANT
GOWN STRL REUS W/ TWL XL LVL3 (GOWN DISPOSABLE) ×1 IMPLANT
GOWN STRL REUS W/TWL LRG LVL3 (GOWN DISPOSABLE) ×1
GOWN STRL REUS W/TWL MED LVL3 (GOWN DISPOSABLE) ×2 IMPLANT
GOWN STRL REUS W/TWL XL LVL3 (GOWN DISPOSABLE) ×1
GRADUATE 1200CC STRL 31836 (MISCELLANEOUS) ×2 IMPLANT
INTRAOP MONITOR FEE IMPULS NCS (MISCELLANEOUS) ×1
INTRAOP MONITOR FEE IMPULSE (MISCELLANEOUS) ×1
KIT ACCES W MAGNET X1 BATTERY (MISCELLANEOUS) ×2 IMPLANT
KIT TURNOVER KIT A (KITS) ×2 IMPLANT
KIT WILSON FRAME (KITS) ×2 IMPLANT
LEAD LAMITRODE PENTA 3MM 60CM (Lead) ×2 IMPLANT
MARKER SKIN DUAL TIP RULER LAB (MISCELLANEOUS) ×2 IMPLANT
MULTILEAD TRIAL CABLE ×2 IMPLANT
NDL SAFETY ECLIPSE 18X1.5 (NEEDLE) ×1 IMPLANT
NEEDLE HYPO 18GX1.5 SHARP (NEEDLE) ×1
NEEDLE HYPO 22GX1.5 SAFETY (NEEDLE) ×2 IMPLANT
NS IRRIG 1000ML POUR BTL (IV SOLUTION) ×2 IMPLANT
PACK LAMINECTOMY NEURO (CUSTOM PROCEDURE TRAY) ×2 IMPLANT
PAD ARMBOARD 7.5X6 YLW CONV (MISCELLANEOUS) ×4 IMPLANT
SPOGE SURGIFLO 8M (HEMOSTASIS) ×1
SPONGE SURGIFLO 8M (HEMOSTASIS) ×1 IMPLANT
STAPLER SKIN PROX 35W (STAPLE) IMPLANT
SUT ETH BLK MONO 3 0 FS 1 12/B (SUTURE) ×10 IMPLANT
SUT POLYSORB 2-0 5X18 GS-10 (SUTURE) ×16 IMPLANT
SUT SILK 2 0SH CR/8 30 (SUTURE) ×2 IMPLANT
SUT VIC AB 0 CT1 18XCR BRD 8 (SUTURE) ×2 IMPLANT
SUT VIC AB 0 CT1 27 (SUTURE) ×2
SUT VIC AB 0 CT1 27XCR 8 STRN (SUTURE) ×2 IMPLANT
SUT VIC AB 0 CT1 8-18 (SUTURE) ×2
SYR 10ML LL (SYRINGE) ×4 IMPLANT
SYR 20CC LL (SYRINGE) ×2 IMPLANT
SYR 30ML LL (SYRINGE) ×4 IMPLANT
SYR 3ML LL SCALE MARK (SYRINGE) ×2 IMPLANT
TOOL TUNNELING 20 INCH (ORTHOPEDIC DISPOSABLE SUPPLIES) ×2 IMPLANT
TOWEL OR 17X26 4PK STRL BLUE (TOWEL DISPOSABLE) ×4 IMPLANT
TUBING CONNECTING 10 (TUBING) ×2 IMPLANT
TUNNELING TOOL, 20 INCHES IMPLANT

## 2018-09-09 NOTE — Transfer of Care (Signed)
Immediate Anesthesia Transfer of Care Note  [name]  Procedure(s) Performed: LUMBAR SPINAL CORD STIMULATOR LEAD AND BATTERY REMOVAL AND LAMINECTOMY FOR PLACEMENT OF PADDLE (N/A )  Patient Location: PACU  Anesthesia Type:General  Level of Consciousness: awake  Airway & Oxygen Therapy: Patient Spontanous Breathing and Patient connected to face mask oxygen  Post-op Assessment: Report given to RN and Post -op Vital signs reviewed and stable  Post vital signs: Reviewed  Last Vitals:  Vitals Value Taken Time  BP 140/80 09/09/2018 10:52 AM  Temp    Pulse 93 09/09/2018 10:52 AM  Resp 15 09/09/2018 10:52 AM  SpO2 100 % 09/09/2018 10:52 AM    Last Pain:  Vitals:   09/09/18 0624  TempSrc: Temporal  PainSc: 0-No pain         Complications: No apparent anesthesia complications

## 2018-09-09 NOTE — Care Management Obs Status (Signed)
[place] NOTIFICATION   Patient Details  Name: Anthony [name] MRN: [id number] Date of Birth: December 04, 1935   Medicare Observation Status Notification Given:  Yes    Nini Cavan Jen Mow, RN 09/09/2018, 5:05 PM

## 2018-09-09 NOTE — Op Note (Signed)
Operative Note  SURGERY DATE:09/09/2018  PRE-OP DIAGNOSIS: Chronic Pain Syndrome(G89.4)  POST-OP DIAGNOSIS:Chronic Pain Syndrome(G89.4)  Procedure(s) with comments: Removal Left flank pulse generator and percutaneous leads Placement of thoracic spinal cord stimulator via laminectomy  SURGEON:  * Malen Gauze, MD Marin Olp, PA Assistant  ANESTHESIA:General   ANESTHESIA: General   OPERATIVE FINDINGS: Successful placement of thoracic SCS paddle trial and removal of battery and percutaneous leads  OPERATIVE REPORT:   Indication Mr. Meuser was seen in clinic on 10/28 after having a thoracic SCS implant in 2017 for back pain. H e was not getting any effective stimulation and one of his leads was malfunctioning. CT myelogram of the spine revealed no concerning stenosis at the level of the implant. The patient wished to proceed to removal of the current system and placement of a paddle trial to see if we could get any effective stimulation and pain relief.. Risks including damage to spinal cord, weakness, hematoma, infection, failure of pain relief, post-operative pain, need for revision, stroke, heart attack, pneumonia, and spinal cord injury were discussed. He understood if succesful he would need a battery implant and if not, we would remove the entire system.   Procedure The patient was brought to the operating room where vascular access was obtained and intubated by the anesthesia service. Neuromonitoring electrodes were placed for SSEP and EMG with good baselines obtained. Antibiotics were given.  The patient was turned prone onto a Wilson frame. Fluoroscopy was used to confirm planned incision in thoracic area. The patient was prepped and draped in a sterile fashion. A hard time out was performed. Local anesthetic was instilled into incisions.   First, the prior midline lumbar and left flank incisions were opened and dissection taken to the leads and  battery. Once these were exposed, we removed them without complication. All retaining sutures, anchors, and leads were removed. The leads were cut at the battery and this was then removed. Hemostasis was obtained. Once clear, the placement of the paddle trial proceeded.   The thoracic incision was opened sharply over the midline and continued through the fascia to the level of the T10 lamina. The lamina above was exposed and fluoroscopy was used to confirm the T9/10 interspace. Next, a combination of rongeurs and drill were used to remove the caudal half of the T9 lamina along with underlying ligament to expose the dura. The opening was widened to allow for paddle placement. There was some scar tissue seen and a trial lead was able to be passed. The [employer] Penta paddle was inserted gently in the rostral direction placing it in the midline of the exposure. However, this was unable to be passed above the T8/9 interspace without resistance.  Given this, we did expose the T8 lamina above and then the T8/9 interspace in a similar manner. The dura was exposed here by minimal bone removal followed by ligament removal. The scar tissue was also seen here but the dissector was easily passed. The Penta paddle was again passed through the T9/10 interspace and once visualized at the top of T9, traction was used to pull it to the T8 lamina and then inserted beneath this. There was some resistance at the top of T8. Fluoroscopy was used to confirm midline placement and levels with the tip of the paddle resting at the mid-T8 vertebral body. Monitoring confirmed no changes in MEP/SSEPs. The leads were then secured to the muscle with a strain relief using silk suture. The leads were then tunneled from the  thoracic incision to the flank. There, the leads were connected to the extension wires. These wires were then tunneled to an exit site in right flank.. The  paddle was interrogated at this time and impedence found to be  adequate.  Hemostasis was achieved. Each incision was irrigated with antibiotic saline and vancomycin powder placed.   A final fluoroscopic image was taken show good placement of paddle. Then, each incision was closed with combination of 0, 2-0, and 3-0 vicryls. The skin was closed with 3-0 Nylon. Sterile dressings were applied. The patient was returned to supine position and extubated. The patient was seen to be moving all extremities symmetrically and was taken to PACU for recovery. The family was updated and all questions answered.   Lubertha Sayres BLOOD LOSS: 50 cc  IMPLANTS: Penta 2mm Lead, 60cm   Inventory Item:  Serial no.: [id number] Model/Cat no.: [id number]  Implant name: Penta 4mm Lead, 60cm Laterality: N/A Area: Spine Lumbar  Manufacturer: [place] [employer]OT Date of Manufacture: [id number]   Action: Implanted Number Used: 1   Device Identifier:  Device Identifier Type:    EXTENSION, 60CM   Inventory Item:  Serial no.:  Model/Cat no.: [id number]  Implant name: EXTENSION, 60CM Laterality: N/A Area: Spine Lumbar  Manufacturer: [place] Date of Manufacture: [id number]   Action: Implanted Number Used: 2   Device Identifier:  Device Identifier Type:        Deetta Perla, MD [id number]

## 2018-09-09 NOTE — Progress Notes (Signed)
Vancomycin an cefazolin dosing  Vancomycin 1750 mg (~15 mg/kg) and cefazolin 3 grams x1 ordered for surgical prophylaxis per consult.  Sim Boast, PharmD, BCPS  09/09/18 6:33 AM

## 2018-09-09 NOTE — Care Management Obs Status (Signed)
[place] NOTIFICATION   Patient Details  Name: Anthony [name] MRN: [id number] Date of Birth: 1936/02/26   Medicare Observation Status Notification Given:  Yes    Melenda Bielak Jen Mow, RN 09/09/2018, 5:01 PM

## 2018-09-09 NOTE — Discharge Summary (Signed)
Procedure: SCS hardware removal with trial paddle placement. Procedure date: 09/09/2018 Diagnosis: Back pain   History: Anthony [name] is POD0 s/p SCS hardware removal and trial paddle placement. Tolerated procedure well. Seen in recovery, still disoriented from anesthesia but able to answer questions and obey commands. Complains of 9/10 back pain, but refusing pain medications at this time.   Update: 1733  Pain reported 0-1/10. He has eaten, voided, and ambulated without issue. SCS rep has seen patient and paddle trial begun.   Physical Exam: Vitals:   09/09/18 0624  BP: (!) 146/81  Pulse: 74  Resp: 18  Temp: (!) 97.4 F (36.3 C)  SpO2: 98%    AA Ox3 Strength:5/5 throughout upper and lower extremities.  Sensation: Intact and symmetric upper and lower extremities.    Data:  No results for input(s): NA, K, CL, CO2, BUN, CREATININE, LABGLOM, GLUCOSE, CALCIUM in the last 168 hours. No results for input(s): AST, ALT, ALKPHOS in the last 168 hours.  Invalid input(s): TBILI   No results for input(s): WBC, HGB, HCT, PLT in the last 168 hours. No results for input(s): APTT, INR in the last 168 hours.       Other tests/results:  EXAM: OPERATIVE THORACIC SPINE SINGLE VIEW(S)  COMPARISON:  Intraoperative imaging this same day.  FINDINGS: Spinal stimulator is in place and projects from mid T9-T10. No acute finding.  IMPRESSION: As above.  Assessment/Plan:  Anthony [name] is POD0 s/p SCS hardware removal and trial paddle placement. Pain has remained absent or very minimal during recovery.  No neurological deficits noted. Ambulated, voided, and eaten without issue. SCS representative has initiated paddle trial. Current SCS hardware will be permanently placed next week if it successfully improves pain.   Marin Olp PA-C Department of Neurosurgery

## 2018-09-09 NOTE — Anesthesia Preprocedure Evaluation (Signed)
Anesthesia Evaluation  Patient identified by MRN, date of birth, ID band Patient awake    Reviewed: Allergy & Precautions, NPO status , Patient's Chart, lab work & pertinent test results  History of Anesthesia Complications (+) Emergence Delirium and history of anesthetic complications  Airway Mallampati: II  TM Distance: >3 FB     Dental  (+) Chipped, Caps   Pulmonary shortness of breath and with exertion, sleep apnea ,    Pulmonary exam normal        Cardiovascular hypertension, Pt. on medications + CAD and +CHF  Normal cardiovascular exam+ dysrhythmias Atrial Fibrillation      Neuro/Psych negative psych ROS   GI/Hepatic Neg liver ROS,   Endo/Other  Hypothyroidism   Renal/GU Renal InsufficiencyRenal disease     Musculoskeletal   Abdominal Normal abdominal exam  (+)   Peds negative pediatric ROS (+)  Hematology negative hematology ROS (+)   Anesthesia Other Findings Past Medical History: 1980s: Anosmia 2018: Atrial flutter (HCC)     Comment:  had an ablation with dr. Erick Alley No date: CAD (coronary artery disease) No date: Cancer ([place])     Comment:  skin cancer No date: Chronic constipation No date: Chronic insomnia No date: Chronic lower back pain     Comment:  s/p spine stimulator placement No date: Complication of anesthesia     Comment:  when under general, he woke up agitated and unable to be              held down No date: Diastolic CHF ([place]) No date: Dyspnea     Comment:  had prior to having cardiac stent placed 2017: History of diverticulitis 2013: History of pneumonia No date: Hyperlipidemia No date: Hypertension No date: Hypothyroidism No date: Obesity, Class II, BMI 35-39.9, with comorbidity No date: OSA on CPAP  Reproductive/Obstetrics                             Anesthesia Physical Anesthesia Plan  ASA: III  Anesthesia Plan: General   Post-op Pain  Management:    Induction: Intravenous  PONV Risk Score and Plan: TIVA  Airway Management Planned: Oral ETT  Additional Equipment:   Intra-op Plan:   Post-operative Plan: Extubation in OR  Informed Consent: I have reviewed the patients History and Physical, chart, labs and discussed the procedure including the risks, benefits and alternatives for the proposed anesthesia with the patient or authorized representative who has indicated his/her understanding and acceptance.   Dental advisory given  Plan Discussed with: CRNA and Surgeon  Anesthesia Plan Comments: (Monitoring case with no long acting muscle relaxants)        Anesthesia Quick Evaluation

## 2018-09-09 NOTE — Discharge Instructions (Addendum)
NEUROSURGERY DISCHARGE INSTRUCTIONS  The following are instructions to help in your recovery once you have been discharged from the hospital. Even if you feel well, it is important that you follow these activity guidelines. If you do not let your neck heal properly from the surgery, you can increase the chance of return of your symptoms and other complications.   What to do after you leave the hospital:  Recommended diet:  Increase protein intake to promote wound healing. You may return to your usual diet. However, you may experience discomfort when swallowing in the first month after your surgery. This is normal. You may find that softer foods are more comfortable for you to swallow. Be sure to stay hydrated.   Recommended activity: No bending, lifting, or twisting (BLT). Avoid lifting objects heavier than 10 pounds (gallon milk jug). Where possible, avoid household activities that involve lifting, bending, reaching, pushing, or pulling such as laundry, vacuuming, grocery shopping, and childcare. Try to arrange for help from friends and family for these activities while your back heals.   Increase physical activity slowly as tolerated. Taking short walks is encouraged, but avoid strenuous exercise. Do not jog, run, bicycle, lift weights, or participate in any other exercises unless specifically allowed by your doctor.   You should not drive until cleared by your doctor.   Until released by your doctor, you should not return to work or school. You should rest at home and let your body heal.   You may shower the day after your surgery. After showering, lightly dab your incision dry. Do not take a tub bath or go swimming until approved by your doctor at your follow-up appointment.   If you smoke, we strongly recommend that you quit. Smoking has been proven to interfere with normal bone healing and will dramatically reduce the success rate of your surgery. Please contact [facility] (800-QUIT-NOW)  and use the resources at www.[facility].com for assistance in stopping smoking.   Medications  Do not restart Aspirin or Omega 3 until seven days after surgery  TAKE KEFLEX AS DIRECTED  * Do not take anti-inflammatory medications for 3 days after surgery (naproxen [Aleve], ibuprofen [Advil, Motrin], celecoxib [Celebrex], etc.).   You may restart home medications.   Wound Care Instructions  If you have a dressing on your incision, remove it two days after your surgery. Keep your incision area clean and dry.   If you have staples or stitches on your incision, you should have a follow up scheduled for removal. If you do not have staples or stitches, you will have steri-strips (small pieces of surgical tape) or Dermabond glue. The steri-strips/glue should begin to peel away within about a week (it is fine if the steri-strips fall off before then). If the strips are still in place one week after your surgery, you may gently remove them.    Please Report any of the following: Should you experience any of the following, contact us immediately:   New numbness or weakness   Pain that is progressively getting worse, and is not relieved by your pain medication, muscle relaxers, rest, and warm compresses   Bleeding, redness, swelling, pain, or drainage from surgical incision   Chills or flu-like symptoms   Fever greater than 101.0 F (38.3 C)   Inability to eat, drink fluids, or take medications   Problems with bowel or bladder functions   Difficulty breathing or shortness of breath   Warmth, tenderness, or swelling in your calf    Additional  Follow up appointments During office hours (Monday-Friday 9 am to 5 pm), please call your physician at [phone]  After hours and weekends, please call the [facility] Operator at  [phone] and ask for the Neurosurgeon On Call   For a life-threatening emergency, call 911

## 2018-09-09 NOTE — Interval H&P Note (Signed)
History and Physical Interval Note:  09/09/2018 6:53 AM  Anthony [name]  has presented today for surgery, with the diagnosis of CHRONIC PAIN SYNDROME, FAILED SPINAL CORD STIMULATOR  The various methods of treatment have been discussed with the patient and family. After consideration of risks, benefits and other options for treatment, the patient has consented to  Procedure(s): LUMBAR SPINAL CORD STIMULATOR LEAD AND BATTERY REMOVAL AND LAMINECTOMY FOR PLACEMENT OF PADDLE (N/A) as a surgical intervention .  The patient's history has been reviewed, patient examined, no change in status, stable for surgery.  I have reviewed the patient's chart and labs.  Questions were answered to the patient's satisfaction.     Deetta Perla

## 2018-09-09 NOTE — Anesthesia Postprocedure Evaluation (Signed)
Anesthesia Post Note  [name]  Procedure(s) Performed: LUMBAR SPINAL CORD STIMULATOR LEAD AND BATTERY REMOVAL AND LAMINECTOMY FOR PLACEMENT OF PADDLE (N/A )  Patient location during evaluation: PACU Anesthesia Type: General Level of consciousness: awake and alert and oriented Pain management: pain level controlled Vital Signs Assessment: post-procedure vital signs reviewed and stable Respiratory status: spontaneous breathing Cardiovascular status: blood pressure returned to baseline Anesthetic complications: no     Last Vitals:  Vitals:   09/09/18 1536 09/09/18 1705  BP: (!) 142/86 (!) 147/90  Pulse: 100 (!) 116  Resp:    Temp:    SpO2: 97% 96%    Last Pain:  Vitals:   09/09/18 1238  TempSrc: Oral  PainSc:                  Navreet Bolda

## 2018-09-09 NOTE — Anesthesia Post-op Follow-up Note (Signed)
Anesthesia QCDR form completed.        

## 2018-09-09 NOTE — Progress Notes (Signed)
Patient did not want to take any of his medications on admission including his home meds. He states he will take them when he is discharged. He states that is costs him more money to take the medication here.

## 2018-09-09 NOTE — Anesthesia Procedure Notes (Signed)
Procedure Name: Intubation Performed by: Rolla Plate, CRNA Pre-anesthesia Checklist: Patient identified, Patient being monitored, Timeout performed, Emergency Drugs available and Suction available Patient Re-evaluated:Patient Re-evaluated prior to induction Oxygen Delivery Method: Circle system utilized Preoxygenation: Pre-oxygenation with 100% oxygen Induction Type: IV induction Ventilation: Mask ventilation without difficulty Laryngoscope Size: McGraph and 4 Grade View: Grade III Tube type: Oral Tube size: 7.5 mm Number of attempts: 2 Airway Equipment and Method: Stylet and Video-laryngoscopy Placement Confirmation: ETT inserted through vocal cords under direct vision,  positive ETCO2 and breath sounds checked- equal and bilateral Secured at: 23 cm Tube secured with: Tape Dental Injury: Teeth and Oropharynx as per pre-operative assessment  Difficulty Due To: Difficulty was anticipated, Difficult Airway- due to reduced neck mobility, Difficult Airway- due to anterior larynx and Difficult Airway- due to dentition Future Recommendations: Recommend- induction with short-acting agent, and alternative techniques readily available

## 2018-09-10 ENCOUNTER — Encounter: Payer: Self-pay | Admitting: Family Medicine

## 2018-09-11 ENCOUNTER — Encounter: Payer: Self-pay | Admitting: Neurosurgery

## 2018-09-12 NOTE — Patient Instructions (Signed)
Your procedure is scheduled on Mon 1/20 Report to Day Surgery To find out your arrival time please call [phone] between 1PM - 3PM on tomorrow  Remember: Instructions that are not followed completely may result in serious medical risk,  up to and including death, or upon the discretion of your surgeon and anesthesiologist your  surgery may need to be rescheduled.     _X__ 1. Do not eat food after midnight the night before your procedure.                 No gum chewing or hard candies. You may drink clear liquids up to 2 hours                 before you are scheduled to arrive for your surgery- DO not drink clear                 liquids within 2 hours of the start of your surgery.                 Clear Liquids include:  water, apple juice without pulp, clear carbohydrate                 drink such as Clearfast of Gatorade, Black Coffee or Tea (Do not add                 anything to coffee or tea).  __X__2.  On the morning of surgery brush your teeth with toothpaste and water, you                may rinse your mouth with mouthwash if you wish.  Do not swallow any toothpaste of mouthwash.     _X__ 3.  No Alcohol for 24 hours before or after surgery.   ___ 4.  Do Not Smoke or use e-cigarettes For 24 Hours Prior to Your Surgery.                 Do not use any chewable tobacco products for at least 6 hours prior to                 surgery.  ____  5.  Bring all medications with you on the day of surgery if instructed.   __x__  6.  Notify your doctor if there is any change in your medical condition      (cold, fever, infections).     Do not wear jewelry, make-up, hairpins, clips or nail polish. Do not wear lotions, powders, or perfumes. You may wear deodorant. Do not shave 48 hours prior to surgery. Men may shave face and neck. Do not bring valuables to the hospital.    [facility] is not responsible for any belongings or valuables.  Contacts, dentures or  bridgework may not be worn into surgery. Leave your suitcase in the car. After surgery it may be brought to your room. For patients admitted to the hospital, discharge time is determined by your treatment team.   Patients discharged the day of surgery will not be allowed to drive home.   Please read over the following fact sheets that you were given:     __x__ Take these medicines the morning of surgery with A SIP OF WATER:    1. Proscar  2. Synthroid  3. flomax  4.tramadol  5.  6.  ____ Fleet Enema (as directed)   __x__ Unable to use CHG Soap use due to spinal cord stimulation.  Will  do Sage upon arrival  ____ Use inhalers on the day of surgery  ____ Stop metformin 2 days prior to surgery    ____ Take 1/2 of usual insulin dose the night before surgery. No insulin the morning          of surgery.   _x___ Stopped aspirin already  ____ Stop Anti-inflammatories on    ____ Stop supplements until after surgery.    ____ Bring C-Pap to the hospital.

## 2018-09-12 NOTE — Pre-Procedure Instructions (Signed)
Patient instructed on fasting and medication to take the morning of surgery.  Instructed to call for arrival time tomorrow.

## 2018-09-16 ENCOUNTER — Ambulatory Visit: Payer: Medicare Other | Admitting: Certified Registered Nurse Anesthetist

## 2018-09-16 ENCOUNTER — Encounter
Admission: RE | Disposition: A | Discharge status: Home / Self Care | Payer: Self-pay | Source: Home / Self Care | Attending: Neurosurgery

## 2018-09-16 ENCOUNTER — Ambulatory Visit
Admission: RE | Admit: 2018-09-16 | Discharge: 2018-09-16 | Disposition: A | Discharge status: Home / Self Care | Payer: Medicare Other | Attending: Neurosurgery | Admitting: Neurosurgery

## 2018-09-16 ENCOUNTER — Encounter: Payer: Self-pay | Admitting: Certified Registered Nurse Anesthetist

## 2018-09-16 DIAGNOSIS — M545 Low back pain: Secondary | ICD-10-CM | POA: Insufficient documentation

## 2018-09-16 DIAGNOSIS — I5032 Chronic diastolic (congestive) heart failure: Secondary | ICD-10-CM | POA: Diagnosis not present

## 2018-09-16 DIAGNOSIS — Z87891 Personal history of nicotine dependence: Secondary | ICD-10-CM | POA: Insufficient documentation

## 2018-09-16 DIAGNOSIS — I130 Hypertensive heart and chronic kidney disease with heart failure and stage 1 through stage 4 chronic kidney disease, or unspecified chronic kidney disease: Secondary | ICD-10-CM | POA: Diagnosis not present

## 2018-09-16 DIAGNOSIS — N183 Chronic kidney disease, stage 3 (moderate): Secondary | ICD-10-CM | POA: Diagnosis not present

## 2018-09-16 DIAGNOSIS — G4730 Sleep apnea, unspecified: Secondary | ICD-10-CM | POA: Insufficient documentation

## 2018-09-16 DIAGNOSIS — T84113A Breakdown (mechanical) of internal fixation device of bone of left forearm, initial encounter: Secondary | ICD-10-CM | POA: Diagnosis not present

## 2018-09-16 DIAGNOSIS — G894 Chronic pain syndrome: Secondary | ICD-10-CM | POA: Insufficient documentation

## 2018-09-16 DIAGNOSIS — E669 Obesity, unspecified: Secondary | ICD-10-CM | POA: Diagnosis not present

## 2018-09-16 DIAGNOSIS — E785 Hyperlipidemia, unspecified: Secondary | ICD-10-CM | POA: Diagnosis not present

## 2018-09-16 DIAGNOSIS — Z4542 Encounter for adjustment and management of neuropacemaker (brain) (peripheral nerve) (spinal cord): Secondary | ICD-10-CM | POA: Diagnosis not present

## 2018-09-16 DIAGNOSIS — Z79899 Other long term (current) drug therapy: Secondary | ICD-10-CM | POA: Diagnosis not present

## 2018-09-16 DIAGNOSIS — I110 Hypertensive heart disease with heart failure: Secondary | ICD-10-CM | POA: Diagnosis not present

## 2018-09-16 DIAGNOSIS — Z85828 Personal history of other malignant neoplasm of skin: Secondary | ICD-10-CM | POA: Diagnosis not present

## 2018-09-16 DIAGNOSIS — I2510 Atherosclerotic heart disease of native coronary artery without angina pectoris: Secondary | ICD-10-CM | POA: Diagnosis not present

## 2018-09-16 DIAGNOSIS — J449 Chronic obstructive pulmonary disease, unspecified: Secondary | ICD-10-CM | POA: Insufficient documentation

## 2018-09-16 DIAGNOSIS — I509 Heart failure, unspecified: Secondary | ICD-10-CM | POA: Insufficient documentation

## 2018-09-16 DIAGNOSIS — G4733 Obstructive sleep apnea (adult) (pediatric): Secondary | ICD-10-CM | POA: Insufficient documentation

## 2018-09-16 DIAGNOSIS — T85193A Other mechanical complication of implanted electronic neurostimulator, generator, initial encounter: Secondary | ICD-10-CM | POA: Diagnosis not present

## 2018-09-16 DIAGNOSIS — Z6835 Body mass index (BMI) 35.0-35.9, adult: Secondary | ICD-10-CM | POA: Insufficient documentation

## 2018-09-16 DIAGNOSIS — E039 Hypothyroidism, unspecified: Secondary | ICD-10-CM | POA: Insufficient documentation

## 2018-09-16 DIAGNOSIS — I5030 Unspecified diastolic (congestive) heart failure: Secondary | ICD-10-CM | POA: Diagnosis not present

## 2018-09-16 HISTORY — PX: SPINAL CORD STIMULATOR INSERTION: SHX5378

## 2018-09-16 SURGERY — INSERTION, SPINAL CORD STIMULATOR, LUMBAR
Anesthesia: General

## 2018-09-16 MED ORDER — BUPIVACAINE-EPINEPHRINE (PF) 0.5% -1:200000 IJ SOLN
INTRAMUSCULAR | Status: AC
  Filled 2018-09-16: qty 90

## 2018-09-16 MED ORDER — BACITRACIN 50000 UNITS IM SOLR
INTRAMUSCULAR | Status: AC
  Filled 2018-09-16: qty 1

## 2018-09-16 MED ORDER — FENTANYL CITRATE (PF) 100 MCG/2ML IJ SOLN
INTRAMUSCULAR | Status: AC
  Filled 2018-09-16: qty 2

## 2018-09-16 MED ORDER — DEXMEDETOMIDINE HCL IN NACL 200 MCG/50ML IV SOLN
INTRAVENOUS | Status: AC
  Filled 2018-09-16: qty 50

## 2018-09-16 MED ORDER — FAMOTIDINE 20 MG PO TABS
20.0000 mg | ORAL_TABLET | Freq: Once | ORAL | Status: AC
  Administered 2018-09-16: 20 mg via ORAL

## 2018-09-16 MED ORDER — DEXTROSE 5 % IV SOLN
3.0000 g | INTRAVENOUS | Status: AC
  Administered 2018-09-16: 3 g via INTRAVENOUS
  Filled 2018-09-16: qty 3

## 2018-09-16 MED ORDER — PROPOFOL 500 MG/50ML IV EMUL
INTRAVENOUS | Status: AC
  Filled 2018-09-16: qty 50

## 2018-09-16 MED ORDER — DEXMEDETOMIDINE HCL 200 MCG/2ML IV SOLN
INTRAVENOUS | Status: DC | PRN
  Administered 2018-09-16: 4 ug via INTRAVENOUS
  Administered 2018-09-16 (×2): 8 ug via INTRAVENOUS

## 2018-09-16 MED ORDER — FENTANYL CITRATE (PF) 100 MCG/2ML IJ SOLN
INTRAMUSCULAR | Status: DC | PRN
  Administered 2018-09-16: 25 ug via INTRAVENOUS
  Administered 2018-09-16: 50 ug via INTRAVENOUS
  Administered 2018-09-16: 25 ug via INTRAVENOUS

## 2018-09-16 MED ORDER — SODIUM CHLORIDE 0.9 % IR SOLN
Status: DC | PRN
  Administered 2018-09-16: 1000 mL

## 2018-09-16 MED ORDER — SODIUM CHLORIDE FLUSH 0.9 % IV SOLN
INTRAVENOUS | Status: AC
  Filled 2018-09-16: qty 10

## 2018-09-16 MED ORDER — LIDOCAINE HCL (PF) 2 % IJ SOLN
INTRAMUSCULAR | Status: AC
  Filled 2018-09-16: qty 10

## 2018-09-16 MED ORDER — FENTANYL CITRATE (PF) 100 MCG/2ML IJ SOLN: 25.0000 ug | INTRAMUSCULAR | Status: DC | PRN

## 2018-09-16 MED ORDER — ONDANSETRON HCL 4 MG/2ML IJ SOLN: 4.0000 mg | Freq: Once | INTRAMUSCULAR | Status: DC | PRN

## 2018-09-16 MED ORDER — BUPIVACAINE-EPINEPHRINE (PF) 0.5% -1:200000 IJ SOLN
INTRAMUSCULAR | Status: DC | PRN
  Administered 2018-09-16: 16 mL

## 2018-09-16 MED ORDER — LACTATED RINGERS IV SOLN
INTRAVENOUS | Status: DC
  Administered 2018-09-16: 07:00:00 via INTRAVENOUS

## 2018-09-16 MED ORDER — PROPOFOL 500 MG/50ML IV EMUL
INTRAVENOUS | Status: DC | PRN
  Administered 2018-09-16: 50 ug/kg/min via INTRAVENOUS

## 2018-09-16 MED ORDER — PROPOFOL 10 MG/ML IV BOLUS
INTRAVENOUS | Status: DC | PRN
  Administered 2018-09-16: 20 mg via INTRAVENOUS
  Administered 2018-09-16: 24 mg via INTRAVENOUS

## 2018-09-16 MED ORDER — LIDOCAINE HCL (CARDIAC) PF 100 MG/5ML IV SOSY
PREFILLED_SYRINGE | INTRAVENOUS | Status: DC | PRN
  Administered 2018-09-16: 80 mg via INTRAVENOUS

## 2018-09-16 MED ORDER — FAMOTIDINE 20 MG PO TABS
ORAL_TABLET | ORAL | Status: AC
  Administered 2018-09-16: 20 mg via ORAL
  Filled 2018-09-16: qty 1

## 2018-09-16 MED ORDER — TRAMADOL HCL 50 MG PO TABS: 50.0000 mg | ORAL_TABLET | ORAL | 0 refills | Status: AC | PRN

## 2018-09-16 SURGICAL SUPPLY — 71 items
BLADE BOVIE TIP EXT 4 (BLADE) ×2 IMPLANT
BUR NEURO DRILL SOFT 3.0X3.8M (BURR) ×2 IMPLANT
CANISTER SUCT 1200ML W/VALVE (MISCELLANEOUS) ×3 IMPLANT
CHLORAPREP W/TINT 26ML (MISCELLANEOUS) ×2 IMPLANT
CNTNR SPEC 2.5X3XGRAD LEK (MISCELLANEOUS) ×1
CONT SPEC 4OZ STER OR WHT (MISCELLANEOUS) ×1
CONTAINER SPEC 2.5X3XGRAD LEK (MISCELLANEOUS) ×1 IMPLANT
COUNTER NEEDLE 20/40 LG (NEEDLE) ×2 IMPLANT
COVER LIGHT HANDLE STERIS (MISCELLANEOUS) ×4 IMPLANT
COVER WAND RF STERILE (DRAPES) ×2 IMPLANT
CUP MEDICINE 2OZ PLAST GRAD ST (MISCELLANEOUS) ×2 IMPLANT
DERMABOND ADVANCED (GAUZE/BANDAGES/DRESSINGS) ×1
DERMABOND ADVANCED .7 DNX12 (GAUZE/BANDAGES/DRESSINGS) ×1 IMPLANT
DRAPE C-ARM XRAY 36X54 (DRAPES) ×2 IMPLANT
DRAPE LAPAROTOMY 100X77 ABD (DRAPES) ×2 IMPLANT
DRAPE MICROSCOPE SPINE 48X150 (DRAPES) IMPLANT
DRAPE POUCH INSTRU U-SHP 10X18 (DRAPES) ×2 IMPLANT
DRAPE SURG 17X11 SM STRL (DRAPES) ×2 IMPLANT
DRSG TEGADERM 4X4.75 (GAUZE/BANDAGES/DRESSINGS) ×1 IMPLANT
DURASEAL APPLICATOR TIP (TIP) IMPLANT
DURASEAL SPINE SEALANT 3ML (MISCELLANEOUS) IMPLANT
ELECT CAUTERY BLADE TIP 2.5 (TIP) ×2
ELECT EZSTD 165MM 6.5IN (MISCELLANEOUS) ×2
ELECT REM PT RETURN 9FT ADLT (ELECTROSURGICAL) ×2
ELECTRODE CAUTERY BLDE TIP 2.5 (TIP) ×1 IMPLANT
ELECTRODE EZSTD 165MM 6.5IN (MISCELLANEOUS) ×1 IMPLANT
ELECTRODE REM PT RTRN 9FT ADLT (ELECTROSURGICAL) ×1 IMPLANT
GAUZE SPONGE 4X4 12PLY STRL (GAUZE/BANDAGES/DRESSINGS) IMPLANT
GENERATOR PULSE PROCLAIM 5ELIT (Neuro Prosthesis/Implant) IMPLANT
GLOVE BIOGEL PI IND STRL 7.0 (GLOVE) ×1 IMPLANT
GLOVE BIOGEL PI IND STRL 8 (GLOVE) ×1 IMPLANT
GLOVE BIOGEL PI INDICATOR 7.0 (GLOVE) ×1
GLOVE BIOGEL PI INDICATOR 8 (GLOVE) ×1
GLOVE SURG SYN 7.0 (GLOVE) ×4 IMPLANT
GLOVE SURG SYN 7.0 PF PI (GLOVE) ×2 IMPLANT
GLOVE SURG SYN 8.0 (GLOVE) ×2 IMPLANT
GLOVE SURG SYN 8.0 PF PI (GLOVE) ×1 IMPLANT
GOWN STRL REUS W/ TWL LRG LVL3 (GOWN DISPOSABLE) ×1 IMPLANT
GOWN STRL REUS W/ TWL XL LVL3 (GOWN DISPOSABLE) ×1 IMPLANT
GOWN STRL REUS W/TWL LRG LVL3 (GOWN DISPOSABLE) ×1
GOWN STRL REUS W/TWL MED LVL3 (GOWN DISPOSABLE) ×2 IMPLANT
GOWN STRL REUS W/TWL XL LVL3 (GOWN DISPOSABLE) ×1
GRADUATE 1200CC STRL 31836 (MISCELLANEOUS) ×2 IMPLANT
HANDLE YANKAUER SUCT BULB TIP (MISCELLANEOUS) ×1 IMPLANT
KIT TURNOVER KIT A (KITS) ×2 IMPLANT
KIT WILSON FRAME (KITS) ×2 IMPLANT
MARKER SKIN DUAL TIP RULER LAB (MISCELLANEOUS) ×2 IMPLANT
NDL SAFETY ECLIPSE 18X1.5 (NEEDLE) ×1 IMPLANT
NEEDLE HYPO 18GX1.5 SHARP (NEEDLE) ×1
NEEDLE HYPO 22GX1.5 SAFETY (NEEDLE) ×2 IMPLANT
NS IRRIG 1000ML POUR BTL (IV SOLUTION) ×2 IMPLANT
PACK LAMINECTOMY NEURO (CUSTOM PROCEDURE TRAY) ×2 IMPLANT
PAD ARMBOARD 7.5X6 YLW CONV (MISCELLANEOUS) ×2 IMPLANT
PROCLAIM ×1 IMPLANT
PROGRAMMER DBS W/MAGNET NMRI (MISCELLANEOUS) ×1 IMPLANT
PULSE GENERATOR PROCLAIM 5ELIT (Neuro Prosthesis/Implant) ×2 IMPLANT
SPOGE SURGIFLO 8M (HEMOSTASIS)
SPONGE SURGIFLO 8M (HEMOSTASIS) ×1 IMPLANT
STAPLER SKIN PROX 35W (STAPLE) IMPLANT
SUT ETHILON 3-0 FS-10 30 BLK (SUTURE) ×4
SUT POLYSORB 2-0 5X18 GS-10 (SUTURE) ×1 IMPLANT
SUT VIC AB 0 CT1 18XCR BRD 8 (SUTURE) ×2 IMPLANT
SUT VIC AB 0 CT1 8-18 (SUTURE) ×2
SUT VIC AB 2-0 CT1 18 (SUTURE) ×2 IMPLANT
SUTURE EHLN 3-0 FS-10 30 BLK (SUTURE) IMPLANT
SYR 10ML LL (SYRINGE) ×4 IMPLANT
SYR 20CC LL (SYRINGE) ×2 IMPLANT
SYR 30ML LL (SYRINGE) ×4 IMPLANT
SYR 3ML LL SCALE MARK (SYRINGE) ×2 IMPLANT
TOWEL OR 17X26 4PK STRL BLUE (TOWEL DISPOSABLE) ×4 IMPLANT
TUBING CONNECTING 10 (TUBING) ×2 IMPLANT

## 2018-09-16 NOTE — Interval H&P Note (Signed)
History and Physical Interval Note:  09/16/2018 6:33 AM  Anthony [name]  has presented today for surgery, with the diagnosis of CHRONIC PAIN SYNDROME, FAILED SPINAL CORD STIMULATOR  The various methods of treatment have been discussed with the patient and family. After consideration of risks, benefits and other options for treatment, the patient has consented to  Procedure(s): Adrian (N/A) as a surgical intervention .  The patient's history has been reviewed, patient examined, no change in status, stable for surgery.  I have reviewed the patient's chart and labs.  Questions were answered to the patient's satisfaction.     Deetta Perla

## 2018-09-16 NOTE — Anesthesia Post-op Follow-up Note (Signed)
Anesthesia QCDR form completed.        

## 2018-09-16 NOTE — Discharge Summary (Signed)
Procedure: SCS battery placement Procedure date: 09/16/2018 Diagnosis: Back pain   History: Anthony [name] is POD0 s/p SCS battery placement. He is recovering well. Denies any pain at this time.  Physical Exam: Vitals:   09/16/18 0608  BP: (!) 142/77  Pulse: (!) 59  Temp: (!) 97 F (36.1 C)  SpO2: 97%    AA Ox3 Strength:5/5 throughout Sensation: intact and symmetric  Data:  No results for input(s): NA, K, CL, CO2, BUN, CREATININE, LABGLOM, GLUCOSE, CALCIUM in the last 168 hours. No results for input(s): AST, ALT, ALKPHOS in the last 168 hours.  Invalid input(s): TBILI   No results for input(s): WBC, HGB, HCT, PLT in the last 168 hours. No results for input(s): APTT, INR in the last 168 hours.       Other tests/results: No imaging reviewed.   Assessment/Plan:  Anthony [name] is s/p SCS battery placement. He is recovering well. He will follow up in clinic in approximately 1 week for suture removal from procedure last week. Will continue pain control with tylenol, ibuprofen, and tramadol as needed.   Marin Olp PA-C Department of Neurosurgery

## 2018-09-16 NOTE — Anesthesia Postprocedure Evaluation (Signed)
Anesthesia Post Note  [name]  Procedure(s) Performed: PLACEMENT OF SPINAL CORD STIMULATOR BATTERY (N/A )  Patient location during evaluation: PACU Anesthesia Type: General Level of consciousness: awake and alert and oriented Pain management: pain level controlled Vital Signs Assessment: post-procedure vital signs reviewed and stable Respiratory status: spontaneous breathing, nonlabored ventilation and respiratory function stable Cardiovascular status: blood pressure returned to baseline and stable Postop Assessment: no signs of nausea or vomiting Anesthetic complications: no     Last Vitals:  Vitals:   09/16/18 0840 09/16/18 0854  BP: 131/79 135/69  Pulse: (!) 55 (!) 56  Resp: (!) 22 18  Temp:    SpO2: 98% 100%    Last Pain:  Vitals:   09/16/18 0854  TempSrc: Temporal  PainSc: 0-No pain                 Zivah Mayr

## 2018-09-16 NOTE — Transfer of Care (Signed)
Immediate Anesthesia Transfer of Care Note  [name]  Procedure(s) Performed: PLACEMENT OF SPINAL CORD STIMULATOR BATTERY (N/A )  Patient Location: PACU  Anesthesia Type:MAC  Level of Consciousness: awake, alert , oriented and patient cooperative  Airway & Oxygen Therapy: Patient Spontanous Breathing  Post-op Assessment: Report given to RN and Post -op Vital signs reviewed and stable  Post vital signs: Reviewed and stable  Last Vitals:  Vitals Value Taken Time  BP 120/69 09/16/2018  8:23 AM  Temp    Pulse 60 09/16/2018  8:24 AM  Resp 15 09/16/2018  8:24 AM  SpO2 97 % 09/16/2018  8:24 AM  Vitals shown include unvalidated device data.  Last Pain:  Vitals:   09/16/18 0825  TempSrc:   PainSc: (P) 0-No pain         Complications: No apparent anesthesia complications

## 2018-09-16 NOTE — Op Note (Signed)
Operative Note  SURGERY DATE:09/16/2018  PRE-OP DIAGNOSIS: Chronic Pain Syndrome(G89.4)  POST-OP DIAGNOSIS:Chronic Pain Syndrome(G89.4)  Procedure(s) with comments: Placement  Left flank pulse generator and removal of trial leads  SURGEON:  * Malen Gauze, MD Marin Olp, PA Assistant  ANESTHESIA:General   ANESTHESIA:General   OPERATIVE FINDINGS:Successful placement of thoracic SCS paddle trial and removal of battery and percutaneous leads  OPERATIVE REPORT: Indication Mr. Fisherhad a thoracic SCSpaddle trial placed on 09/09/18. He went home and the SCS did provide significant relief of his back pain. Given this, he wished to proceed to battery implant.  Risks including hematoma, infection, failure of pain relief, post-operative pain, need for revision, stroke, heart attack, pneumonia,were discussed.    Procedure The patient was brought to the operating room where vascular access was obtained and MAC provided by the anesthesia service.The patient was turned prone onto a Wilson frame.  Local anesthetic was instilled into flank incision.   First, the prior left flank incision sutures and the exit site suture were removed. The flank incision was opened and dissection taken to the leads. The extension leads were removed with screwdriver and then cut. The external battery pack and leads were pulled and then a 3-0 Nylon placed at the right flank exit site.   Next, the SCS leads were inspected, cleaned and then inserted to Abbott pulse generator provided. The battery pocket was slightly expanded. Hemostasis was achieved and the wound thoroughly irrigated. The battery was interrogated and found to have adequate impedence. The battery was then inserted into the pocket and the skin closed over this.   The incision was closed with combination of 2-0, and 3-0 vicryls. The skin was closed with 3-0 Nylon. Sterile dressings were applied. The patient was  returned to supine position. The patient was seen to be moving all extremities symmetrically and was taken to PACU for recovery. The family was updated and all questions answered.  Lubertha Sayres BLOOD LOSS:10 cc  IMPLANTS:  PULSE GENERATOR PROCLAIM 5ELIT - [id number]   Inventory Item: PULSE GENERATOR PROCLAIM 5ELIT Serial no.: [id number] Model/Cat no.: [id number]  Implant name: PULSE GENERATOR PROCLAIM 5ELIT - [id number] Laterality: N/A Area: Back   Manufacturer: [employer] [place] [employer] A[employer]OTT Date of Manufacture:    Action: Implanted Number Used: 1   Device Identifier:  Device Identifier Type:        Deetta Perla, MD [id number]

## 2018-09-16 NOTE — Anesthesia Preprocedure Evaluation (Signed)
Anesthesia Evaluation  Patient identified by MRN, date of birth, ID band Patient awake    Reviewed: Allergy & Precautions, NPO status , Patient's Chart, lab work & pertinent test results  History of Anesthesia Complications (+) DIFFICULT AIRWAY and history of anesthetic complications  Airway Mallampati: III  TM Distance: >3 FB Neck ROM: Full    Dental  (+) Implants   Pulmonary sleep apnea and Continuous Positive Airway Pressure Ventilation , neg COPD,    breath sounds clear to auscultation- rhonchi (-) wheezing      Cardiovascular hypertension, Pt. on medications + CAD and +CHF (preserved EF)  (-) Past MI, (-) Cardiac Stents and (-) CABG + dysrhythmias (s/p ablation for aflutter)  Rhythm:Regular Rate:Normal - Systolic murmurs and - Diastolic murmurs    Neuro/Psych neg Seizures negative neurological ROS  negative psych ROS   GI/Hepatic negative GI ROS, Neg liver ROS,   Endo/Other  neg diabetesHypothyroidism   Renal/GU Renal InsufficiencyRenal disease     Musculoskeletal negative musculoskeletal ROS (+)   Abdominal (+) + obese,   Peds  Hematology negative hematology ROS (+)   Anesthesia Other Findings Past Medical History: 1980s: Anosmia 2018: Atrial flutter (HCC)     Comment:  had an ablation with dr. Erick Alley No date: CAD (coronary artery disease) No date: Cancer ([employer])     Comment:  skin cancer No date: Chronic constipation No date: Chronic insomnia No date: Chronic lower back pain     Comment:  s/p spine stimulator placement No date: Complication of anesthesia     Comment:  when under general, he woke up agitated and unable to be              held down No date: Diastolic CHF ([place]) No date: Dyspnea     Comment:  had prior to having cardiac stent placed 2017: History of diverticulitis 2013: History of pneumonia No date: Hyperlipidemia No date: Hypertension No date: Hypothyroidism No date: Obesity, Class  II, BMI 35-39.9, with comorbidity No date: OSA on CPAP   Reproductive/Obstetrics                             Anesthesia Physical Anesthesia Plan  ASA: III  Anesthesia Plan: General   Post-op Pain Management:    Induction: Intravenous  PONV Risk Score and Plan: 1 and Propofol infusion  Airway Management Planned: Natural Airway  Additional Equipment:   Intra-op Plan:   Post-operative Plan:   Informed Consent: I have reviewed the patients History and Physical, chart, labs and discussed the procedure including the risks, benefits and alternatives for the proposed anesthesia with the patient or authorized representative who has indicated his/her understanding and acceptance.     Dental advisory given  Plan Discussed with: CRNA and Anesthesiologist  Anesthesia Plan Comments:         Anesthesia Quick Evaluation

## 2018-09-16 NOTE — Discharge Instructions (Addendum)
NEUROSURGERY DISCHARGE INSTRUCTIONS   The following are instructions to help in your recovery once you have been discharged from the hospital. Even if you feel well, it is important that you follow these activity guidelines. If you do not let your neck heal properly from the surgery, you can increase the chance of return of your symptoms and other complications.   What to do after you leave the hospital:  Recommended diet:  Increase protein intake to promote wound healing. You may return to your usual diet. However, you may experience discomfort when swallowing in the first month after your surgery. This is normal. You may find that softer foods are more comfortable for you to swallow. Be sure to stay hydrated.   Recommended activity:   Increase physical activity slowly as tolerated. Taking short walks is encouraged, but avoid strenuous exercise. Do not jog, run, bicycle, lift weights, or participate in any other exercises unless specifically allowed by your doctor.   You should not drive until cleared by your doctor.   Until released by your doctor, you should not return to work or school. You should rest at home and let your body heal.   You may shower the day after your surgery. After showering, lightly dab your incision dry. Do not take a tub bath or go swimming until approved by your doctor at your follow-up appointment.   If you smoke, we strongly recommend that you quit. Smoking has been proven to interfere with normal bone healing and will dramatically reduce the success rate of your surgery. Please contact [facility] (800-QUIT-NOW) and use the resources at www.[facility].com for assistance in stopping smoking.   Medications  Do not restart Aspirin until seven days after surgery  * Do not take anti-inflammatory medications for 3 months after surgery (naproxen [Aleve], ibuprofen [Advil, Motrin], celecoxib [Celebrex], etc.). These medications can prevent your bones from healing  properly.   You may restart home medications.   Wound Care Instructions  If you have a dressing on your incision, remove it two days after your surgery. Keep your incision area clean and dry.   If you have staples or stitches on your incision, you should have a follow up scheduled for removal. If you do not have staples or stitches, you will have steri-strips (small pieces of surgical tape) or Dermabond glue. The steri-strips/glue should begin to peel away within about a week (it is fine if the steri-strips fall off before then). If the strips are still in place one week after your surgery, you may gently remove them.    Please Report any of the following: Should you experience any of the following, contact us immediately:   New numbness or weakness   Pain that is progressively getting worse, and is not relieved by your pain medication, muscle relaxers, rest, and warm compresses   Bleeding, redness, swelling, pain, or drainage from surgical incision   Chills or flu-like symptoms   Fever greater than 101.0 F (38.3 C)   Inability to eat, drink fluids, or take medications   Problems with bowel or bladder functions   Difficulty breathing or shortness of breath   Warmth, tenderness, or swelling in your calf    Additional Follow up appointments During office hours (Monday-Friday 9 am to 5 pm), please call your physician at [phone]  After hours and weekends, please call the [facility] Operator at  [phone] and ask for the Neurosurgeon On Call   For a life-threatening emergency, call 911    AMBULATORY  SURGERY  DISCHARGE INSTRUCTIONS   1) The drugs that you were given will stay in your system until tomorrow so for the next 24 hours you should not:  A) Drive an automobile B) Make any legal decisions C) Drink any alcoholic beverage   2) You may resume regular meals tomorrow.  Today it is better to start with liquids and gradually work up to solid  foods.  You may eat anything you prefer, but it is better to start with liquids, then soup and crackers, and gradually work up to solid foods.   3) Please notify your doctor immediately if you have any unusual bleeding, trouble breathing, redness and pain at the surgery site, drainage, fever, or pain not relieved by medication.    4) Additional Instructions:        Please contact your physician with any problems or Same Day Surgery at [phone], Monday through Friday 6 am to 4 pm, or [place] at [facility] number at [phone].

## 2018-09-16 NOTE — OR Nursing (Signed)
Anthony [name] in to speak with pt/spouse [phone]

## 2018-09-17 ENCOUNTER — Encounter: Payer: Self-pay | Admitting: Neurosurgery

## 2018-09-20 ENCOUNTER — Encounter: Payer: Self-pay | Admitting: Neurosurgery

## 2018-10-06 ENCOUNTER — Other Ambulatory Visit: Payer: Self-pay | Admitting: Family Medicine

## 2018-10-15 ENCOUNTER — Ambulatory Visit: Payer: Medicare Other

## 2018-10-22 ENCOUNTER — Encounter: Payer: Medicare Other | Admitting: Family Medicine

## 2018-10-24 ENCOUNTER — Other Ambulatory Visit: Payer: Self-pay | Admitting: Family Medicine

## 2018-10-24 ENCOUNTER — Ambulatory Visit (INDEPENDENT_AMBULATORY_CARE_PROVIDER_SITE_OTHER): Payer: Medicare Other

## 2018-10-24 VITALS — BP 108/70 | HR 64 | Temp 98.4°F | Ht 73.0 in | Wt 267.3 lb

## 2018-10-24 DIAGNOSIS — E039 Hypothyroidism, unspecified: Secondary | ICD-10-CM | POA: Diagnosis not present

## 2018-10-24 DIAGNOSIS — N400 Enlarged prostate without lower urinary tract symptoms: Secondary | ICD-10-CM

## 2018-10-24 DIAGNOSIS — E559 Vitamin D deficiency, unspecified: Secondary | ICD-10-CM

## 2018-10-24 DIAGNOSIS — E782 Mixed hyperlipidemia: Secondary | ICD-10-CM | POA: Diagnosis not present

## 2018-10-24 DIAGNOSIS — N183 Chronic kidney disease, stage 3 unspecified: Secondary | ICD-10-CM

## 2018-10-24 DIAGNOSIS — Z0000 Encounter for general adult medical examination without abnormal findings: Secondary | ICD-10-CM | POA: Diagnosis not present

## 2018-10-24 LAB — TSH: TSH: 0.04 u[IU]/mL — ABNORMAL LOW (ref 0.35–4.50)

## 2018-10-24 LAB — COMPREHENSIVE METABOLIC PANEL
ALT: 18 U/L (ref 0–53)
AST: 18 U/L (ref 0–37)
Albumin: 4.4 g/dL (ref 3.5–5.2)
Alkaline Phosphatase: 61 U/L (ref 39–117)
BUN: 27 mg/dL — ABNORMAL HIGH (ref 6–23)
CHLORIDE: 102 meq/L (ref 96–112)
CO2: 30 mEq/L (ref 19–32)
Calcium: 11.2 mg/dL — ABNORMAL HIGH (ref 8.4–10.5)
Creatinine, Ser: 1.41 mg/dL (ref 0.40–1.50)
GFR: 48.24 mL/min — ABNORMAL LOW (ref >60.00)
Glucose, Bld: 99 mg/dL (ref 70–99)
Potassium: 4 mEq/L (ref 3.5–5.1)
Sodium: 138 mEq/L (ref 135–145)
Total Bilirubin: 0.6 mg/dL (ref 0.2–1.2)
Total Protein: 7.9 g/dL (ref 6.0–8.3)

## 2018-10-24 LAB — LIPID PANEL
Cholesterol: 196 mg/dL (ref 0–200)
HDL: 47.6 mg/dL (ref >39.00)
NonHDL: 147.93
Total CHOL/HDL Ratio: 4
Triglycerides: 213 mg/dL — ABNORMAL HIGH (ref 0.0–149.0)
VLDL: 42.6 mg/dL — ABNORMAL HIGH (ref 0.0–40.0)

## 2018-10-24 LAB — LDL CHOLESTEROL, DIRECT: Direct LDL: 117 mg/dL

## 2018-10-24 LAB — VITAMIN D 25 HYDROXY (VIT D DEFICIENCY, FRACTURES): VITD: 40.55 ng/mL (ref 30.00–100.00)

## 2018-10-24 NOTE — Patient Instructions (Addendum)
Anthony [name] , Thank you for taking time to come for your Medicare Wellness Visit. I appreciate your ongoing commitment to your health goals. Please review the following plan we discussed and let me know if I can assist you in the future.   These are the goals we discussed: Goals    . Increase physical activity     Starting 10/24/2018, I will continue to exercise for at least 45 minutes daily.        This is a list of the screening recommended for you and due dates:  Health Maintenance  Topic Date Due  . DTaP/Tdap/Td vaccine (1 - Tdap) 10/08/2021*  . Tetanus Vaccine  10/08/2021  . Flu Shot  Completed  . Pneumonia vaccines  Completed  *Topic was postponed. The date shown is not the original due date.   Preventive Care for Adults  A healthy lifestyle and preventive care can promote health and wellness. Preventive health guidelines for adults include the following key practices.  . A routine yearly physical is a good way to check with your health care provider about your health and preventive screening. It is a chance to share any concerns and updates on your health and to receive a thorough exam.  . Visit your dentist for a routine exam and preventive care every 6 months. Brush your teeth twice a day and floss once a day. Good oral hygiene prevents tooth decay and gum disease.  . The frequency of eye exams is based on your age, health, family medical history, use  of contact lenses, and other factors. Follow your health care provider's recommendations for frequency of eye exams.  . Eat a healthy diet. Foods like vegetables, fruits, whole grains, low-fat dairy products, and lean protein foods contain the nutrients you need without too many calories. Decrease your intake of foods high in solid fats, added sugars, and salt. Eat the right amount of calories for you. Get information about a proper diet from your health care provider, if necessary.  . Regular physical exercise is one of the  most important things you can do for your health. Most adults should get at least 150 minutes of moderate-intensity exercise (any activity that increases your heart rate and causes you to sweat) each week. In addition, most adults need muscle-strengthening exercises on 2 or more days a week.  Silver Sneakers may be a benefit available to you. To determine eligibility, you may visit the website: www.silversneakers.com or contact program at (440)389-8587 Mon-Fri between 8AM-8PM.   . Maintain a healthy weight. The body mass index (BMI) is a screening tool to identify possible weight problems. It provides an estimate of body fat based on height and weight. Your health care provider can find your BMI and can help you achieve or maintain a healthy weight.   For adults 20 years and older: ? A BMI below 18.5 is considered underweight. ? A BMI of 18.5 to 24.9 is normal. ? A BMI of 25 to 29.9 is considered overweight. ? A BMI of 30 and above is considered obese.   . Maintain normal blood lipids and cholesterol levels by exercising and minimizing your intake of saturated fat. Eat a balanced diet with plenty of fruit and vegetables. Blood tests for lipids and cholesterol should begin at age 19 and be repeated every 5 years. If your lipid or cholesterol levels are high, you are over 50, or you are at high risk for heart disease, you may need your cholesterol levels checked more  frequently. Ongoing high lipid and cholesterol levels should be treated with medicines if diet and exercise are not working.  . If you smoke, find out from your health care provider how to quit. If you do not use tobacco, please do not start.  . If you choose to drink alcohol, please do not consume more than 2 drinks per day. One drink is considered to be 12 ounces (355 mL) of beer, 5 ounces (148 mL) of wine, or 1.5 ounces (44 mL) of liquor.  . If you are 52-45 years old, ask your health care provider if you should take aspirin to  prevent strokes.  . Use sunscreen. Apply sunscreen liberally and repeatedly throughout the day. You should seek shade when your shadow is shorter than you. Protect yourself by wearing long sleeves, pants, a wide-brimmed hat, and sunglasses year round, whenever you are outdoors.  . Once a month, do a whole body skin exam, using a mirror to look at the skin on your back. Tell your health care provider of new moles, moles that have irregular borders, moles that are larger than a pencil eraser, or moles that have changed in shape or color.

## 2018-10-24 NOTE — Progress Notes (Signed)
PCP notes:   Health maintenance:  No gaps identified.  Abnormal screenings:   Hearing - failed  Hearing Screening   125Hz  250Hz  500Hz  1000Hz  2000Hz  3000Hz  4000Hz  6000Hz  8000Hz   Right ear:   40 0 40  40    Left ear:   40 0 40  0      Visual Acuity Screening   Right eye Left eye Both eyes  Without correction: 20/25-1 20/30-1 20/25-2  With correction:      Mini-Cog score: 17/20 MMSE - Mini Mental State Exam 10/24/2018 10/11/2017  Orientation to time 5 5  Orientation to Place 5 5  Registration 3 3  Attention/ Calculation 0 0  Recall 1 3  Recall-comments unable to recall 2 of 3 words -  Language- name 2 objects 0 0  Language- repeat 1 1  Language- follow 3 step command 2 3  Language- follow 3 step command-comments unable to follow 1 step of 3 step command -  Language- read & follow direction 0 0  Write a sentence 0 0  Copy design 0 0  Total score 17 20    Patient concerns:   None  Nurse concerns:  None  Next PCP appt:   11/05/18 @ 1030

## 2018-10-24 NOTE — Progress Notes (Signed)
Subjective:   Anthony [name] is a 83 y.o. male who presents for Medicare Annual (Subsequent) preventive examination.  Review of Systems:  N/A Cardiac Risk Factors include: advanced age (>91men, >22 women);hypertension;male gender     Objective:     Vitals: BP 108/70 (BP Location: Right Arm, Patient Position: Sitting, Cuff Size: Large)   Pulse 64   Temp 98.4 F (36.9 C) (Oral)   Ht 6\' 1"  (1.854 m) Comment: shoes  Wt 267 lb 4.8 oz (121.2 kg)   SpO2 93%   BMI 35.27 kg/m   Body mass index is 35.27 kg/m.  Advanced Directives 10/24/2018 08/22/2018 03/08/2018 12/27/2017 10/11/2017 09/13/2017 06/21/2017  Does Patient Have a Medical Advance Directive? Yes Yes Yes Yes Yes Yes Yes  Type of Paramedic of [place];Living will [place];Living will [place];Living will [place];Living will [place];Living will [place];Living will -  Does patient want to make changes to medical advance directive? - No - Patient declined No - Patient declined - - - -  Copy of [place] in Chart? No - copy requested No - copy requested No - copy requested No - copy requested No - copy requested No - copy requested -  Would patient like information on creating a medical advance directive? - - - - - - -    Tobacco Social History   Tobacco Use  Smoking Status Never Smoker  Smokeless Tobacco Never Used     Counseling given: No   Clinical Intake:  Pre-visit preparation completed: Yes  Pain : No/denies pain Pain Score: 0-No pain     Nutritional Status: BMI > 30  Obese Nutritional Risks: None Diabetes: No  How often do you need to have someone help you when you read instructions, pamphlets, or other written materials from your doctor or pharmacy?: 1 - Never What is the last grade level you completed in school?: 12th grade + 2 yrs college  Interpreter Needed?:  No  Comments: pt is a widower and lives alone Information entered by :: LPinson, LPN  Past Medical History:  Diagnosis Date  . Anosmia 1980s  . Atrial flutter ([place]) 2018   had an ablation with dr. Erick Alley  . CAD (coronary artery disease)   . Cancer ([place])    skin cancer  . Chronic constipation   . Chronic insomnia   . Chronic lower back pain    s/p spine stimulator placement  . Complication of anesthesia    when under general, he woke up agitated and unable to be held down  . Diastolic CHF ([place])   . Dyspnea    had prior to having cardiac stent placed  . History of diverticulitis 2017  . History of pneumonia 2013  . Hyperlipidemia   . Hypertension   . Hypothyroidism   . Obesity, Class II, BMI 35-39.9, with comorbidity   . OSA on CPAP    Past Surgical History:  Procedure Laterality Date  . A-FLUTTER ABLATION N/A 05/07/2017   Procedure: A-Flutter Ablation;  Surgeon: Deboraha Sprang, MD;  Location: [facility];  Service: Cardiovascular;  Laterality: N/A;  . CARDIOVASCULAR STRESS TEST  11/2017   no ischemia, low risk study  . COLONOSCOPY  2007   normal per prior PCP records, rpt 10 yrs (Dr Osie Cheeks)  . ELBOW SURGERY Right    ulnar nerve decompression.  PT DOES NOT RECALL THIS PROCEDURE  . LAMINECTOMY  THORACIC SPINE W/ PLACEMENT SPINAL CORD STIMULATOR  08/2018   and removal of prior spine stimulator (Dr Lacinda Axon)  . NASAL SINUS SURGERY     nasal polyps. done a long time ago  . PERCUTANEOUS CORONARY STENT INTERVENTION (PCI-S)  2013   EF55%, 70% mid LAD, 99% mid RCA, mild MR, elev LVEDP, DES to mid LAD. RCA is nondominant  . RADIOFREQUENCY ABLATION  06/2017   lumbar region [facility])   . REPLACEMENT TOTAL KNEE BILATERAL Bilateral 2000s  . SPINAL CORD STIMULATOR IMPLANT  06/2016  . SPINAL CORD STIMULATOR INSERTION N/A 09/09/2018   Procedure: LUMBAR SPINAL CORD STIMULATOR LEAD AND BATTERY REMOVAL AND LAMINECTOMY FOR PLACEMENT OF PADDLE;  Surgeon: Deetta Perla, MD;  Location: [facility]  ORS;  Service: Neurosurgery;  Laterality: N/A;  . SPINAL CORD STIMULATOR INSERTION N/A 09/16/2018   Procedure: PLACEMENT OF SPINAL CORD STIMULATOR BATTERY;  Surgeon: Deetta Perla, MD;  Location: [facility] ORS;  Service: Neurosurgery;  Laterality: N/A;   Family History  Problem Relation Age of Onset  . Stroke Mother   . Kidney disease Father   . Leukemia Brother   . Stroke Sister   . Diabetes Neg Hx    Social History   Socioeconomic History  . Marital status: Widowed    Spouse name: Not on file  . Number of children: Not on file  . Years of education: Not on file  . Highest education level: Not on file  Occupational History  . Not on file  Social Needs  . Financial resource strain: Not on file  . Food insecurity:    Worry: Not on file    Inability: Not on file  . Transportation needs:    Medical: Not on file    Non-medical: Not on file  Tobacco Use  . Smoking status: Never Smoker  . Smokeless tobacco: Never Used  Substance and Sexual Activity  . Alcohol use: No    Comment: QUIT DRINKING  . Drug use: No  . Sexual activity: Not Currently  Lifestyle  . Physical activity:    Days per week: Not on file    Minutes per session: Not on file  . Stress: Not on file  Relationships  . Social connections:    Talks on phone: Not on file    Gets together: Not on file    Attends religious service: Not on file    Active member of club or organization: Not on file    Attends meetings of clubs or organizations: Not on file    Relationship status: Not on file  Other Topics Concern  . Not on file  Social History Narrative   Widower (wife passed 08/2014).    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 Cholecalciferol (VITAMIN D3) 1000 units CAPS Take 1 capsule (1,000 Units total) by mouth  daily.  . diphenhydramine-acetaminophen (TYLENOL PM EXTRA STRENGTH) 25-500 MG TABS tablet Take 1 tablet by mouth at bedtime as needed. (Patient taking differently: Take 1 tablet by mouth at bedtime as needed (for sleep.). )  . docusate sodium (COLACE) 100 MG capsule Take 100 mg by mouth 2 (two) times daily.  . Eszopiclone 3 MG TABS Take 3 mg by mouth at bedtime as needed. Take immediately before bedtime   . ezetimibe (ZETIA)  10 MG tablet TAKE 1 TABLET(10 MG) BY MOUTH DAILY  . fenofibrate 160 MG tablet TAKE 1 TABLET BY MOUTH EVERY DAY  . finasteride (PROSCAR) 5 MG tablet Take 1 tablet (5 mg total) by mouth daily.  . furosemide (LASIX) 40 MG tablet TAKE 1 TABLET BY MOUTH EVERY DAY. TAKE AN EXTRA TABLET AS NEEDED FOR WEIGHT GAIN OF 3 LBS/ DAY OR 5 LBS/ WEEK  . ketoconazole (NIZORAL) 2 % cream Apply 1 application topically daily as needed (for dermatitits).   Marland Kitchen levothyroxine (SYNTHROID, LEVOTHROID) 200 MCG tablet TAKE 1 TABLET BY MOUTH EVERY DAY BEFORE BREAKFAST  . LINZESS 290 MCG CAPS capsule TAKE 1 CAPSULE( 290 MCG TOTAL) BY MOUTH DAILY. TAKE ON AN EMPTY STOMACH AT LEAST 30 MINUTES BEFORE FIRST MEAL OF THE DAY  . lisinopril (PRINIVIL,ZESTRIL) 10 MG tablet TAKE 1 TABLET BY MOUTH DAILY  . loratadine (CLARITIN) 10 MG tablet Take 1 tablet (10 mg total) by mouth daily. (Patient taking differently: Take 10 mg by mouth daily as needed. )  . Multiple Vitamins-Minerals (CENTRUM SILVER PO) Take 1 tablet by mouth daily.  . Multiple Vitamins-Minerals (PRESERVISION AREDS 2 PO) Take 1 tablet by mouth daily.  . nitroGLYCERIN (NITROSTAT) 0.4 MG SL tablet Place 1 tablet (0.4 mg total) under the tongue every 5 (five) minutes as needed for chest pain.  Marland Kitchen orlistat (ALLI) 60 MG capsule Take 1 capsule (60 mg total) by mouth daily as needed (diarrhea).  . potassium chloride (K-DUR,KLOR-CON) 10 MEQ tablet TAKE 1 TABLET(10 MEQ) BY MOUTH TWICE DAILY  . Psyllium (METAMUCIL FIBER) 51.7 % PACK Take 5-10 mLs by mouth daily.  .  tamsulosin (FLOMAX) 0.4 MG CAPS capsule TAKE 2 CAPSULES BY MOUTH EVERY DAY  . [DISCONTINUED] cephALEXin (KEFLEX) 500 MG capsule Take 1 capsule (500 mg total) by mouth 2 (two) times daily.  . [DISCONTINUED] cyclobenzaprine (FLEXERIL) 5 MG tablet Take 1 tablet (5 mg total) by mouth 3 (three) times daily as needed for muscle spasms.   No facility-administered encounter medications on file as of 10/24/2018.     Activities of Daily Living In your present state of health, do you have any difficulty performing the following activities: 10/24/2018 08/22/2018  Hearing? N N  Vision? N N  Difficulty concentrating or making decisions? N N  Walking or climbing stairs? N Y  Dressing or bathing? N N  Doing errands, shopping? N N  Preparing Food and eating ? N -  Using the Toilet? N -  In the past six months, have you accidently leaked urine? N -  Do you have problems with loss of bowel control? N -  Managing your Medications? N -  Managing your Finances? N -  Some recent data might be hidden    Patient Care Team: Ria Bush, MD as PCP - General (Family Medicine) Gillis Santa, MD as Consulting Physician (Pain Medicine) Deboraha Sprang, MD as Consulting Physician (Cardiology)    Assessment:   This is a routine wellness examination for [place].  Exercise Activities and Dietary recommendations Current Exercise Habits: Home exercise routine, Type of exercise: calisthenics;walking(500 situps daily; stationary bike 10 mintues twice daily), Time (Minutes): 45, Frequency (Times/Week): 7, Weekly Exercise (Minutes/Week): 315, Intensity: Mild, Exercise limited by: None identified  Goals    . Increase physical activity     Starting 10/24/2018, I will continue to exercise for at least 45 minutes daily.        Fall Risk Fall Risk  10/24/2018 02/07/2018 12/27/2017 10/18/2017 10/11/2017  Falls in  the past year? 0 No No No No   Depression Screen PHQ 2/9 Scores 10/24/2018 02/07/2018 12/27/2017 10/18/2017  PHQ  - 2 Score 0 0 0 0  PHQ- 9 Score 0 - - -     Cognitive Function MMSE - Mini Mental State Exam 10/24/2018 10/11/2017  Orientation to time 5 5  Orientation to Place 5 5  Registration 3 3  Attention/ Calculation 0 0  Recall 1 3  Recall-comments unable to recall 2 of 3 words -  Language- name 2 objects 0 0  Language- repeat 1 1  Language- follow 3 step command 2 3  Language- follow 3 step command-comments unable to follow 1 step of 3 step command -  Language- read & follow direction 0 0  Write a sentence 0 0  Copy design 0 0  Total score 17 20     PLEASE NOTE: A Mini-Cog screen was completed. Maximum score is 20. A value of 0 denotes this part of [place] MMSE was not completed or the patient failed this part of the Mini-Cog screening.   Mini-Cog Screening Orientation to Time - Max 5 pts Orientation to Place - Max 5 pts Registration - Max 3 pts Recall - Max 3 pts Language Repeat - Max 1 pts Language Follow 3 Step Command - Max 3 pts     Immunization History  Administered Date(s) Administered  . Hepatitis A, Adult 12/12/1996, 06/30/1997  . Influenza Split 07/16/2006, 06/16/2008, 05/17/2009, 07/04/2010, 07/05/2011, 06/18/2012  . Influenza, High Dose Seasonal PF 06/03/2015, 05/18/2016  . Influenza,inj,Quad PF,6+ Mos 06/04/2013, 04/22/2014, 07/24/2017, 05/23/2018  . Pneumococcal Conjugate-13 10/20/2013  . Pneumococcal Polysaccharide-23 02/17/2011, 10/09/2011  . Pneumococcal-Unspecified 08/28/2001  . Td 08/28/2001, 10/09/2011  . Zoster 08/28/2013      Screening Tests Health Maintenance  Topic Date Due  . DTaP/Tdap/Td (1 - Tdap) 10/08/2021 (Originally 10/07/1946)  . TETANUS/TDAP  10/08/2021  . INFLUENZA VACCINE  Completed  . PNA vac Low Risk Adult  Completed      Plan:  I have personally reviewed, addressed, and noted the following in the patient's chart:  A. Medical and social history B. Use of alcohol, tobacco or illicit drugs  C. Current medications and  supplements D. Functional ability and status E.  Nutritional status F.  Physical activity G. Advance directives H. List of other physicians I.  Hospitalizations, surgeries, and ER visits in previous 12 months J.  [place] to include hearing, vision, cognitive, depression L. Referrals and appointments - none  In addition, I have reviewed and discussed with patient certain preventive protocols, quality metrics, and best practice recommendations. A written personalized care plan for preventive services as well as general preventive health recommendations were provided to patient.  See attached scanned questionnaire for additional information.   Signed,   Lindell Noe, MHA, BS, LPN Health Coach

## 2018-10-25 LAB — PARATHYROID HORMONE, INTACT (NO CA): PTH: 43 pg/mL (ref 14–64)

## 2018-10-26 ENCOUNTER — Other Ambulatory Visit: Payer: Self-pay | Admitting: Family Medicine

## 2018-10-26 ENCOUNTER — Encounter: Payer: Self-pay | Admitting: Family Medicine

## 2018-10-28 ENCOUNTER — Other Ambulatory Visit (INDEPENDENT_AMBULATORY_CARE_PROVIDER_SITE_OTHER): Payer: Medicare Other

## 2018-10-28 NOTE — Telephone Encounter (Signed)
Name of Medication: Lunesta Name of Pharmacy: [facility] or Written Date and Quantity:  Last Office Visit and Type: 07/23/18, acute w/ Dr. Einar Pheasant Next Office Visit and Type: 11/05/18, CPE Pt 2 Last Controlled Substance Agreement Date: none Last UDS: none

## 2018-10-29 MED ORDER — ESZOPICLONE 3 MG PO TABS: 3.0000 mg | ORAL_TABLET | Freq: Every evening | ORAL | 0 refills | Status: DC | PRN

## 2018-10-29 MED ORDER — EZETIMIBE 10 MG PO TABS: ORAL_TABLET | ORAL | 1 refills | Status: DC

## 2018-10-30 ENCOUNTER — Other Ambulatory Visit: Payer: Self-pay | Admitting: Family Medicine

## 2018-10-30 ENCOUNTER — Other Ambulatory Visit: Payer: Medicare Other

## 2018-11-02 ENCOUNTER — Other Ambulatory Visit: Payer: Self-pay | Admitting: Family Medicine

## 2018-11-02 LAB — VITAMIN D 1,25 DIHYDROXY
VITAMIN D 1, 25 (OH) TOTAL: 51 pg/mL (ref 18–72)
Vitamin D2 1, 25 (OH)2: <8 pg/mL
Vitamin D3 1, 25 (OH)2: 51 pg/mL

## 2018-11-02 LAB — PTH-RELATED PEPTIDE

## 2018-11-05 ENCOUNTER — Ambulatory Visit (INDEPENDENT_AMBULATORY_CARE_PROVIDER_SITE_OTHER): Payer: Medicare Other | Admitting: Family Medicine

## 2018-11-05 ENCOUNTER — Encounter: Payer: Self-pay | Admitting: Family Medicine

## 2018-11-05 VITALS — BP 116/64 | HR 68 | Temp 98.4°F | Ht 73.0 in | Wt 269.6 lb

## 2018-11-05 DIAGNOSIS — K5909 Other constipation: Secondary | ICD-10-CM | POA: Diagnosis not present

## 2018-11-05 DIAGNOSIS — M545 Low back pain, unspecified: Secondary | ICD-10-CM

## 2018-11-05 DIAGNOSIS — E782 Mixed hyperlipidemia: Secondary | ICD-10-CM

## 2018-11-05 DIAGNOSIS — I5030 Unspecified diastolic (congestive) heart failure: Secondary | ICD-10-CM

## 2018-11-05 DIAGNOSIS — Z9889 Other specified postprocedural states: Secondary | ICD-10-CM

## 2018-11-05 DIAGNOSIS — Z8679 Personal history of other diseases of the circulatory system: Secondary | ICD-10-CM

## 2018-11-05 DIAGNOSIS — N183 Chronic kidney disease, stage 3 unspecified: Secondary | ICD-10-CM

## 2018-11-05 DIAGNOSIS — Z9989 Dependence on other enabling machines and devices: Secondary | ICD-10-CM

## 2018-11-05 DIAGNOSIS — I10 Essential (primary) hypertension: Secondary | ICD-10-CM

## 2018-11-05 DIAGNOSIS — Z9689 Presence of other specified functional implants: Secondary | ICD-10-CM

## 2018-11-05 DIAGNOSIS — R05 Cough: Secondary | ICD-10-CM

## 2018-11-05 DIAGNOSIS — E039 Hypothyroidism, unspecified: Secondary | ICD-10-CM | POA: Diagnosis not present

## 2018-11-05 DIAGNOSIS — I2510 Atherosclerotic heart disease of native coronary artery without angina pectoris: Secondary | ICD-10-CM

## 2018-11-05 DIAGNOSIS — Z7189 Other specified counseling: Secondary | ICD-10-CM | POA: Insufficient documentation

## 2018-11-05 DIAGNOSIS — G4733 Obstructive sleep apnea (adult) (pediatric): Secondary | ICD-10-CM | POA: Diagnosis not present

## 2018-11-05 DIAGNOSIS — F5104 Psychophysiologic insomnia: Secondary | ICD-10-CM

## 2018-11-05 DIAGNOSIS — G8929 Other chronic pain: Secondary | ICD-10-CM

## 2018-11-05 DIAGNOSIS — E559 Vitamin D deficiency, unspecified: Secondary | ICD-10-CM

## 2018-11-05 DIAGNOSIS — R059 Cough, unspecified: Secondary | ICD-10-CM | POA: Insufficient documentation

## 2018-11-05 LAB — PTH-RELATED PEPTIDE: PTH-RELATED PROTEIN (PTH-RP): 18 pg/mL (ref 14–27)

## 2018-11-05 MED ORDER — LEVOTHYROXINE SODIUM 175 MCG PO TABS: 175.0000 ug | ORAL_TABLET | Freq: Every day | ORAL | 3 refills | Status: DC

## 2018-11-05 MED ORDER — LISINOPRIL 10 MG PO TABS: 10.0000 mg | ORAL_TABLET | Freq: Every day | ORAL | 3 refills | Status: DC

## 2018-11-05 MED ORDER — FUROSEMIDE 40 MG PO TABS: ORAL_TABLET | ORAL | 3 refills | Status: DC

## 2018-11-05 MED ORDER — OMEGA-3-ACID ETHYL ESTERS 1 G PO CAPS: 2.0000 g | ORAL_CAPSULE | Freq: Two times a day (BID) | ORAL | 3 refills | Status: DC

## 2018-11-05 MED ORDER — GUAIFENESIN-CODEINE 100-10 MG/5ML PO SYRP: 5.0000 mL | ORAL_SOLUTION | Freq: Two times a day (BID) | ORAL | 0 refills | Status: DC | PRN

## 2018-11-05 MED ORDER — EZETIMIBE 10 MG PO TABS: ORAL_TABLET | ORAL | 3 refills | Status: DC

## 2018-11-05 MED ORDER — ASPIRIN EC 81 MG PO TBEC: 81.0000 mg | DELAYED_RELEASE_TABLET | Freq: Every day | ORAL | Status: AC

## 2018-11-05 MED ORDER — BENZONATATE 100 MG PO CAPS: 100.0000 mg | ORAL_CAPSULE | Freq: Three times a day (TID) | ORAL | 0 refills | Status: DC | PRN

## 2018-11-05 NOTE — Assessment & Plan Note (Signed)
Continues lunesta use.

## 2018-11-05 NOTE — Assessment & Plan Note (Signed)
Has this at home. Sons are HCPOA. Asked to bring [name] copy.

## 2018-11-05 NOTE — Assessment & Plan Note (Addendum)
Overcorrected based on TSH. Will decrease levothyroxine to 118mcg and recheck TFTs in 6 wks. Hyperthyroidism likely causing hypercalcemia will correct and reassess.

## 2018-11-05 NOTE — Assessment & Plan Note (Signed)
Levels well controlled on daily vit D 1000 IU daily

## 2018-11-05 NOTE — Assessment & Plan Note (Signed)
Chronic, stable. Continue current regimen. 

## 2018-11-05 NOTE — Assessment & Plan Note (Signed)
Recent spine stimulator replacement.  Sees Dr Holley Raring and Lacinda Axon pain management.

## 2018-11-05 NOTE — Assessment & Plan Note (Signed)
Stable on linzess - continue.

## 2018-11-05 NOTE — Assessment & Plan Note (Addendum)
Recent deterioration - ?hyperthyroid related - will reassess when returns for for lab visit for TFTs. Already avoids NSAIDs and endorses good water intake. Consider SPEP next labs.

## 2018-11-05 NOTE — Progress Notes (Signed)
BP 116/64 (BP Location: Left Arm, Patient Position: Sitting, Cuff Size: Large)   Pulse 68   Temp 98.4 F (36.9 C) (Oral)   Ht 6\' 1"  (1.854 m)   Wt 269 lb 9 oz (122.3 kg)   SpO2 94%   BMI 35.56 kg/m    CC: [name] f/u visit Subjective:    Patient ID: Anthony [name], male    DOB: 1936-05-06, 83 y.o.   MRN: [id number]  HPI: Anthony [name] is a 83 y.o. male presenting on 11/05/2018 for Annual Exam (Pt 2. )   Mildly productive cough started yesterday. Requests Rx codeine cough syrup. Chronic mucous production even without cough. mucinex has helped sinuses but mucous production continues  Saw Lesia last month for medicare wellness visit. Note reviewed. Failed hearing screen - pt denies concerns at this time.   Hypercalcemia - PTHRP pending.   Chronic lumbar pain followed by pain management Dr Lacinda Axon s/p recent spinal cord stimulator. Minimal narcotic use.   OSA on CPAP - continued struggle. Last sleep study remotely. Declines repeat or referral. Continues lunesta.   Chronic constipation - both slow transit constipation as well as narcotic induced constipation. On linzess and metamucil daily as well as miralax, sennokot, docusate all PRN. Linzess is very expensive. Movantik unaffordable.   Preventative: Colon cancer screening - normal colonoscopy 2007. Denies bowel changes or blood in stool. No known fmhx colon cancer. Aged out.  Prostate cancer screening - aged out  Lung cancer screening - not eligible  Flu shot - yearly Td 2013 Pneumovax 2012, 2013, prevnar 2015 zostavax 2015 shingrix - discussed - not interested Advanced directive discussion - Has this at home. Sons are HCPOA. Asked to bring [name] copy.  Seat belt use discussed - does not use  Sunscreen use discussed, no changing moles on skin. Sees derm Non smoker Alcohol - none Dentist doesn't see - full implants Eye exam Due  Widower (wife passed 08/2014) Lives alone, GF Miss [name] sometimes (she was nurse) Son is [name] (FP at [facility]) Occ: retired, used to [employer] store Edu: HS Activity: tries to do exercises at home - stationary bike Diet: good water, fruits/vegetables daily     Relevant past medical, surgical, family and social history reviewed and updated as indicated. Interim medical history since our last visit reviewed. Allergies and medications reviewed and updated. Outpatient Medications Prior to Visit  Medication Sig Dispense Refill  . Cholecalciferol (VITAMIN D3) 1000 units CAPS Take 1 capsule (1,000 Units total) by mouth daily. 30 capsule   . diphenhydramine-acetaminophen (TYLENOL PM EXTRA STRENGTH) 25-500 MG TABS tablet Take 1 tablet by mouth at bedtime as needed. (Patient taking differently: Take 1 tablet by mouth at bedtime as needed (for sleep.). )    . docusate sodium (COLACE) 100 MG capsule Take 100 mg by mouth 2 (two) times daily.    . Eszopiclone 3 MG TABS Take 1 tablet (3 mg total) by mouth at bedtime as needed. Take immediately before bedtime 30 tablet 0  . fenofibrate 160 MG tablet TAKE 1 TABLET BY MOUTH EVERY DAY 90 tablet 3  . finasteride (PROSCAR) 5 MG tablet Take 1 tablet (5 mg total) by mouth daily. 90 tablet 3  . ketoconazole (NIZORAL) 2 % cream Apply 1 application topically daily as needed (for dermatitits).     Marland Kitchen LINZESS 290 MCG CAPS capsule TAKE 1 CAPSULE(290 MCG) BY MOUTH DAILY 30 MINUTES BEFORE FIRST MEAL OF THE DAY ON AN EMPTY STOMACH  90 capsule 0  . loratadine (CLARITIN) 10 MG tablet Take 1 tablet (10 mg total) by mouth daily. (Patient taking differently: Take 10 mg by mouth daily as needed. )    . Multiple Vitamins-Minerals (CENTRUM SILVER PO) Take 1 tablet by mouth daily.    . Multiple Vitamins-Minerals (PRESERVISION AREDS 2 PO) Take 1 tablet by mouth daily.    . nitroGLYCERIN (NITROSTAT) 0.4 MG SL tablet Place 1 tablet (0.4 mg total) under the tongue every 5 (five) minutes as needed for chest pain. 25 tablet 0  . orlistat (ALLI) 60 MG  capsule Take 1 capsule (60 mg total) by mouth daily as needed (diarrhea).    . potassium chloride (K-DUR,KLOR-CON) 10 MEQ tablet TAKE 1 TABLET(10 MEQ) BY MOUTH TWICE DAILY 180 tablet 1  . Psyllium (METAMUCIL FIBER) 51.7 % PACK Take 5-10 mLs by mouth daily.    . tamsulosin (FLOMAX) 0.4 MG CAPS capsule TAKE 2 CAPSULES BY MOUTH EVERY DAY 180 capsule 2  . ezetimibe (ZETIA) 10 MG tablet TAKE 1 TABLET(10 MG) BY MOUTH DAILY 90 tablet 1  . furosemide (LASIX) 40 MG tablet TAKE 1 TABLET BY MOUTH EVERY DAY. TAKE AN EXTRA TABLET AS NEEDED FOR WEIGHT GAIN OF 3 LBS/ DAY OR 5 LBS/ WEEK 135 tablet 0  . levothyroxine (SYNTHROID, LEVOTHROID) 200 MCG tablet TAKE 1 TABLET BY MOUTH EVERY DAY BEFORE BREAKFAST 90 tablet 0  . lisinopril (PRINIVIL,ZESTRIL) 10 MG tablet TAKE 1 TABLET BY MOUTH DAILY 90 tablet 1  . omega-3 acid ethyl esters (LOVAZA) 1 g capsule Take 2 g by mouth 2 (two) times daily.      No facility-administered medications prior to visit.      Per HPI unless specifically indicated in ROS section below Review of Systems Objective:    BP 116/64 (BP Location: Left Arm, Patient Position: Sitting, Cuff Size: Large)   Pulse 68   Temp 98.4 F (36.9 C) (Oral)   Ht 6\' 1"  (1.854 m)   Wt 269 lb 9 oz (122.3 kg)   SpO2 94%   BMI 35.56 kg/m   Wt Readings from Last 3 Encounters:  11/05/18 269 lb 9 oz (122.3 kg)  10/24/18 267 lb 4.8 oz (121.2 kg)  09/16/18 266 lb 12.1 oz (121 kg)    Physical Exam Vitals signs and nursing note reviewed.  Constitutional:      General: He is not in acute distress.    Appearance: Normal appearance. He is well-developed.  HENT:     Head: Normocephalic and atraumatic.     Right Ear: Hearing, tympanic membrane, ear canal and external ear normal.     Left Ear: Hearing, tympanic membrane, ear canal and external ear normal.     Nose: Nose normal.     Mouth/Throat:     Mouth: Mucous membranes are moist.     Pharynx: Uvula midline. No oropharyngeal exudate or posterior  oropharyngeal erythema.  Eyes:     General: No scleral icterus.    Conjunctiva/sclera: Conjunctivae normal.     Pupils: Pupils are equal, round, and reactive to light.  Neck:     Musculoskeletal: Normal range of motion and neck supple.     Vascular: No carotid bruit.  Cardiovascular:     Rate and Rhythm: Normal rate and regular rhythm.     Pulses: Normal pulses.          Radial pulses are 2+ on the right side and 2+ on the left side.     Heart sounds: Normal heart  sounds. No murmur.  Pulmonary:     Effort: Pulmonary effort is normal. No respiratory distress.     Breath sounds: Normal breath sounds. No wheezing, rhonchi or rales.  Abdominal:     General: Bowel sounds are normal. There is no distension.     Palpations: Abdomen is soft. There is no mass.     Tenderness: There is no abdominal tenderness. There is no guarding or rebound.  Musculoskeletal: Normal range of motion.  Lymphadenopathy:     Cervical: No cervical adenopathy.  Skin:    General: Skin is warm and dry.     Findings: No rash.  Neurological:     Mental Status: He is alert and oriented to person, place, and time.     Comments: CN grossly intact, station and gait intact  Psychiatric:        Mood and Affect: Mood normal.        Behavior: Behavior normal.        Thought Content: Thought content normal.        Judgment: Judgment normal.       Results for orders placed or performed in visit on 10/28/18  Vitamin D 1,25 dihydroxy  Result Value Ref Range   Vitamin D 1, 25 (OH)2 Total 51 18 - 72 pg/mL   Vitamin D3 1, 25 (OH)2 51 pg/mL   Vitamin D2 1, 25 (OH)2 <8 pg/mL  PTH-Related Peptide  Result Value Ref Range   PTH-Related Protein (PTH-RP) CANCELED    Lab Results  Component Value Date   WBC 8.2 08/19/2018   HGB 13.4 08/19/2018   HCT 40.1 08/19/2018   MCV 94.1 08/19/2018   PLT 300 08/19/2018    Assessment & Plan:   Problem List Items Addressed This Visit    Vitamin D deficiency    Levels well  controlled on daily vit D 1000 IU daily      Severe obesity (BMI 35.0-39.9) with comorbidity ([place])    Weight has stabilized. Discussed exercise routine. Encouraged ongoing efforts at sustainable weight loss.       S/P insertion of spinal cord stimulator   OSA on CPAP    Continues using CPAP regularly.       Hypothyroidism    Overcorrected based on TSH. Will decrease levothyroxine to 1103mcg and recheck TFTs in 6 wks. Hyperthyroidism likely causing hypercalcemia will correct and reassess.       Relevant Medications   levothyroxine (SYNTHROID, LEVOTHROID) 175 MCG tablet   Hypertension    Chronic, stable. Continue current regimen.       Relevant Medications   ezetimibe (ZETIA) 10 MG tablet   furosemide (LASIX) 40 MG tablet   lisinopril (PRINIVIL,ZESTRIL) 10 MG tablet   omega-3 acid ethyl esters (LOVAZA) 1 g capsule   aspirin EC 81 MG tablet   Hypercholesterolemia with hypertriglyceridemia    Chronic, LDL above goal in statin intolerance. Encouraged low chol diet. Continue zetia, fibrate, and lovaza.  The ASCVD Risk score Mikey Bussing DC Jr., et al., 2013) failed to calculate for the following reasons:   The 2013 ASCVD risk score is only valid for ages 38 to 74       Relevant Medications   ezetimibe (ZETIA) 10 MG tablet   furosemide (LASIX) 40 MG tablet   lisinopril (PRINIVIL,ZESTRIL) 10 MG tablet   omega-3 acid ethyl esters (LOVAZA) 1 g capsule   aspirin EC 81 MG tablet   Hypercalcemia    Lab work so far unrevealing. PTHRP pending. Anticipate hyperthyroidism causing  hypercalcemia - will correct and reassess.       History of atrial flutter    Treated with cardioversion, tapered off eliquis. Appreciate EP care.       Cough    Reassuring exam. Rx codeine cough syrup per pt request. Discussed tessalon perl trial as well.       CKD (chronic kidney disease) stage 3, GFR 30-59 ml/min (HCC)    Recent deterioration - ?hyperthyroid related - will reassess when returns for for lab  visit for TFTs. Already avoids NSAIDs and endorses good water intake. Consider SPEP next labs.       Chronic lower back pain    Recent spine stimulator replacement.  Sees Dr Holley Raring and Lacinda Axon pain management.       Relevant Medications   aspirin EC 81 MG tablet   Chronic insomnia    Continues lunesta use.       Chronic constipation    Stable on linzess - continue.       CAD (coronary artery disease)   Relevant Medications   ezetimibe (ZETIA) 10 MG tablet   furosemide (LASIX) 40 MG tablet   lisinopril (PRINIVIL,ZESTRIL) 10 MG tablet   omega-3 acid ethyl esters (LOVAZA) 1 g capsule   aspirin EC 81 MG tablet   Advanced care planning/counseling discussion - Primary    Has this at home. Sons are HCPOA. Asked to bring [name] copy.       (HFpEF) heart failure with preserved ejection fraction (HCC)    Seems euvolemic. Continue lasix, potassium.      Relevant Medications   ezetimibe (ZETIA) 10 MG tablet   furosemide (LASIX) 40 MG tablet   lisinopril (PRINIVIL,ZESTRIL) 10 MG tablet   omega-3 acid ethyl esters (LOVAZA) 1 g capsule   aspirin EC 81 MG tablet       Meds ordered this encounter  Medications  . ezetimibe (ZETIA) 10 MG tablet    Sig: TAKE 1 TABLET(10 MG) BY MOUTH DAILY    Dispense:  90 tablet    Refill:  3  . furosemide (LASIX) 40 MG tablet    Sig: TAKE 1 TABLET BY MOUTH EVERY DAY. TAKE AN EXTRA TABLET AS NEEDED FOR WEIGHT GAIN OF 3 LBS/ DAY OR 5 LBS/ WEEK    Dispense:  135 tablet    Refill:  3  . lisinopril (PRINIVIL,ZESTRIL) 10 MG tablet    Sig: Take 1 tablet (10 mg total) by mouth daily.    Dispense:  90 tablet    Refill:  3  . omega-3 acid ethyl esters (LOVAZA) 1 g capsule    Sig: Take 2 capsules (2 g total) by mouth 2 (two) times daily.    Dispense:  360 capsule    Refill:  3  . levothyroxine (SYNTHROID, LEVOTHROID) 175 MCG tablet    Sig: Take 1 tablet (175 mcg total) by mouth daily before breakfast.    Dispense:  30 tablet    Refill:  3    Note new  dosage  . aspirin EC 81 MG tablet    Sig: Take 1 tablet (81 mg total) by mouth daily.  . benzonatate (TESSALON) 100 MG capsule    Sig: Take 1 capsule (100 mg total) by mouth 3 (three) times daily as needed for cough.    Dispense:  30 capsule    Refill:  0  . guaiFENesin-codeine (CHERATUSSIN AC) 100-10 MG/5ML syrup    Sig: Take 5 mLs by mouth 2 (two) times daily as needed.  Dispense:  120 mL    Refill:  0   No orders of the defined types were placed in this encounter.   Patient instructions: If interested, check with pharmacy about new 2 shot shingles series (shingrix).  Schedule eye exam as you're due.  Bring [name] a copy of your advanced directive at your convenience to update your chart.  Thyroid is a bit over-treated - decrease levothyroxine to 161mcg daily. Return in 6 wks for lab visit to recheck thyroid levels.  Restart aspirin May take codeine cough syrup as needed. Try tessalon perls first.  Return as needed or in 3-4 months for follow up visit.   Follow up plan: Return in about 4 months (around 03/07/2019).  Ria Bush, MD

## 2018-11-05 NOTE — Assessment & Plan Note (Signed)
Reassuring exam. Rx codeine cough syrup per pt request. Discussed tessalon perl trial as well.

## 2018-11-05 NOTE — Patient Instructions (Addendum)
If interested, check with pharmacy about new 2 shot shingles series (shingrix).  Schedule eye exam as you're due.  Bring [name] a copy of your advanced directive at your convenience to update your chart.  Thyroid is a bit over-treated - decrease levothyroxine to 185mcg daily. Return in 6 wks for lab visit to recheck thyroid levels.  Restart aspirin May take codeine cough syrup as needed. Try tessalon perls first.  Return as needed or in 3-4 months for follow up visit.   Health Maintenance After Age 55 After age 61, you are at a higher risk for certain long-term diseases and infections as well as injuries from falls. Falls are a major cause of broken bones and head injuries in people who are older than age 48. Getting regular preventive care can help to keep you healthy and well. Preventive care includes getting regular testing and making lifestyle changes as recommended by your health care provider. Talk with your health care provider about:  Which screenings and tests you should have. A screening is a test that checks for a disease when you have no symptoms.  A diet and exercise plan that is right for you. What should I know about screenings and tests to prevent falls? Screening and testing are the best ways to find a health problem early. Early diagnosis and treatment give you the best chance of managing medical conditions that are common after age 40. Certain conditions and lifestyle choices may make you more likely to have a fall. Your health care provider may recommend:  Regular vision checks. Poor vision and conditions such as cataracts can make you more likely to have a fall. If you wear glasses, make sure to get your prescription updated if your vision changes.  Medicine review. Work with your health care provider to regularly review all of the medicines you are taking, including over-the-counter medicines. Ask your health care provider about any side effects that may make you more likely to  have a fall. Tell your health care provider if any medicines that you take make you feel dizzy or sleepy.  Osteoporosis screening. Osteoporosis is a condition that causes the bones to get weaker. This can make the bones weak and cause them to break more easily.  Blood pressure screening. Blood pressure changes and medicines to control blood pressure can make you feel dizzy.  Strength and balance checks. Your health care provider may recommend certain tests to check your strength and balance while standing, walking, or changing positions.  Foot health exam. Foot pain and numbness, as well as not wearing proper footwear, can make you more likely to have a fall.  Depression screening. You may be more likely to have a fall if you have a fear of falling, feel emotionally low, or feel unable to do activities that you used to do.  Alcohol use screening. Using too much alcohol can affect your balance and may make you more likely to have a fall. What actions can I take to lower my risk of falls? General instructions  Talk with your health care provider about your risks for falling. Tell your health care provider if: ? You fall. Be sure to tell your health care provider about all falls, even ones that seem minor. ? You feel dizzy, sleepy, or off-balance.  Take over-the-counter and prescription medicines only as told by your health care provider. These include any supplements.  Eat a healthy diet and maintain a healthy weight. A healthy diet includes low-fat dairy products, low-fat (  lean) meats, and fiber from whole grains, beans, and lots of fruits and vegetables. Home safety  Remove any tripping hazards, such as rugs, cords, and clutter.  Install safety equipment such as grab bars in bathrooms and safety rails on stairs.  Keep rooms and walkways well-lit. Activity   Follow a regular exercise program to stay fit. This will help you maintain your balance. Ask your health care provider what  types of exercise are appropriate for you.  If you need a cane or walker, use it as recommended by your health care provider.  Wear supportive shoes that have nonskid soles. Lifestyle  Do not drink alcohol if your health care provider tells you not to drink.  If you drink alcohol, limit how much you have: ? 0-1 drink a day for women. ? 0-2 drinks a day for men.  Be aware of how much alcohol is in your drink. In the [place] one drink equals one typical bottle of beer (12 oz), one-half glass of wine (5 oz), or one shot of hard liquor (1 oz).  Do not use any products that contain nicotine or tobacco, such as cigarettes and e-cigarettes. If you need help quitting, ask your health care provider. Summary  Having a healthy lifestyle and getting preventive care can help to protect your health and wellness after age 32.  Screening and testing are the best way to find a health problem early and help you avoid having a fall. Early diagnosis and treatment give you the best chance for managing medical conditions that are more common for people who are older than age 31.  Falls are a major cause of broken bones and head injuries in people who are older than age 41. Take precautions to prevent a fall at home.  Work with your health care provider to learn what changes you can make to improve your health and wellness and to prevent falls. This information is not intended to replace advice given to you by your health care provider. Make sure you discuss any questions you have with your health care provider. Document Released: 06/27/2017 Document Revised: 06/27/2017 Document Reviewed: 06/27/2017 Elsevier Interactive Patient Education  2019 [employer].

## 2018-11-05 NOTE — Assessment & Plan Note (Signed)
Lab work so far unrevealing. PTHRP pending. Anticipate hyperthyroidism causing hypercalcemia - will correct and reassess.

## 2018-11-05 NOTE — Assessment & Plan Note (Signed)
Weight has stabilized. Discussed exercise routine. Encouraged ongoing efforts at sustainable weight loss.

## 2018-11-05 NOTE — Assessment & Plan Note (Signed)
Chronic, LDL above goal in statin intolerance. Encouraged low chol diet. Continue zetia, fibrate, and lovaza.  The ASCVD Risk score Mikey Bussing DC Jr., et al., 2013) failed to calculate for the following reasons:   The 2013 ASCVD risk score is only valid for ages 81 to 34

## 2018-11-05 NOTE — Assessment & Plan Note (Signed)
Continues using CPAP regularly.

## 2018-11-05 NOTE — Assessment & Plan Note (Addendum)
Treated with cardioversion, tapered off eliquis. Appreciate EP care.

## 2018-11-05 NOTE — Assessment & Plan Note (Addendum)
Seems euvolemic. Continue lasix, potassium.

## 2018-11-09 ENCOUNTER — Other Ambulatory Visit: Payer: Self-pay | Admitting: Family Medicine

## 2018-11-09 DIAGNOSIS — E039 Hypothyroidism, unspecified: Secondary | ICD-10-CM

## 2018-11-11 ENCOUNTER — Other Ambulatory Visit: Payer: Self-pay | Admitting: Family Medicine

## 2018-11-12 MED ORDER — GUAIFENESIN-CODEINE 100-10 MG/5ML PO SYRP: 5.0000 mL | ORAL_SOLUTION | Freq: Three times a day (TID) | ORAL | 0 refills | Status: DC | PRN

## 2018-11-12 NOTE — Telephone Encounter (Signed)
Eprescribed.

## 2018-11-12 NOTE — Telephone Encounter (Signed)
Last office visit 11/05/2018 for CPE.  Last refilled 11/05/2018 for 120 ml.  Ok to refill?

## 2018-12-08 NOTE — Progress Notes (Signed)
I reviewed health advisor's note, was available for consultation, and agree with documentation and plan.  

## 2018-12-10 ENCOUNTER — Other Ambulatory Visit: Payer: Self-pay | Admitting: Family Medicine

## 2018-12-11 NOTE — Telephone Encounter (Signed)
Electronic refill fenofibrate Last office visit 11/05/18 Last refill 01/04/18 #90/3 See allergy/contraindication

## 2018-12-12 DIAGNOSIS — L814 Other melanin hyperpigmentation: Secondary | ICD-10-CM | POA: Diagnosis not present

## 2018-12-12 DIAGNOSIS — L812 Freckles: Secondary | ICD-10-CM | POA: Diagnosis not present

## 2018-12-12 DIAGNOSIS — L578 Other skin changes due to chronic exposure to nonionizing radiation: Secondary | ICD-10-CM | POA: Diagnosis not present

## 2018-12-12 DIAGNOSIS — L570 Actinic keratosis: Secondary | ICD-10-CM | POA: Diagnosis not present

## 2018-12-12 DIAGNOSIS — L821 Other seborrheic keratosis: Secondary | ICD-10-CM | POA: Diagnosis not present

## 2018-12-17 ENCOUNTER — Other Ambulatory Visit (INDEPENDENT_AMBULATORY_CARE_PROVIDER_SITE_OTHER): Payer: Medicare Other

## 2018-12-17 ENCOUNTER — Other Ambulatory Visit: Payer: Self-pay

## 2018-12-17 DIAGNOSIS — E039 Hypothyroidism, unspecified: Secondary | ICD-10-CM | POA: Diagnosis not present

## 2018-12-17 LAB — BASIC METABOLIC PANEL
BUN: 28 mg/dL — ABNORMAL HIGH (ref 6–23)
CO2: 29 mEq/L (ref 19–32)
Calcium: 10.7 mg/dL — ABNORMAL HIGH (ref 8.4–10.5)
Chloride: 103 mEq/L (ref 96–112)
Creatinine, Ser: 1.22 mg/dL (ref 0.40–1.50)
GFR: 56.98 mL/min — ABNORMAL LOW (ref >60.00)
Glucose, Bld: 116 mg/dL — ABNORMAL HIGH (ref 70–99)
Potassium: 4 mEq/L (ref 3.5–5.1)
Sodium: 137 mEq/L (ref 135–145)

## 2018-12-17 LAB — T4, FREE: Free T4: 0.87 ng/dL (ref 0.60–1.60)

## 2018-12-17 LAB — TSH: TSH: 0.72 u[IU]/mL (ref 0.35–4.50)

## 2019-01-04 ENCOUNTER — Encounter: Payer: Self-pay | Admitting: Family Medicine

## 2019-01-05 ENCOUNTER — Other Ambulatory Visit: Payer: Self-pay | Admitting: Family Medicine

## 2019-01-06 MED ORDER — FINASTERIDE 5 MG PO TABS: 5.0000 mg | ORAL_TABLET | Freq: Every day | ORAL | 0 refills | Status: DC

## 2019-01-06 MED ORDER — TAMSULOSIN HCL 0.4 MG PO CAPS: 0.8000 mg | ORAL_CAPSULE | Freq: Every day | ORAL | 0 refills | Status: DC

## 2019-01-06 NOTE — Telephone Encounter (Signed)
E-scribed refills. Notified pt via MyChart.  

## 2019-01-31 ENCOUNTER — Other Ambulatory Visit: Payer: Self-pay | Admitting: Family Medicine

## 2019-02-03 NOTE — Telephone Encounter (Signed)
Electronic refill request for Levothyroxine 200 mcg Last office visit 11/05/18 Medication list shows Levothyroxine 175 mcg, last refill 11/05/18 #30/3 Please confirm dose

## 2019-02-05 ENCOUNTER — Ambulatory Visit: Payer: Medicare Other | Admitting: Family Medicine

## 2019-02-05 NOTE — Telephone Encounter (Signed)
Refilled

## 2019-02-17 ENCOUNTER — Other Ambulatory Visit: Payer: Self-pay

## 2019-02-17 ENCOUNTER — Ambulatory Visit (INDEPENDENT_AMBULATORY_CARE_PROVIDER_SITE_OTHER)
Admission: RE | Admit: 2019-02-17 | Discharge: 2019-02-17 | Disposition: A | Discharge status: Home / Self Care | Payer: Medicare Other | Source: Ambulatory Visit | Attending: Family Medicine | Admitting: Family Medicine

## 2019-02-17 ENCOUNTER — Ambulatory Visit (INDEPENDENT_AMBULATORY_CARE_PROVIDER_SITE_OTHER): Payer: Medicare Other | Admitting: Family Medicine

## 2019-02-17 ENCOUNTER — Encounter: Payer: Self-pay | Admitting: Family Medicine

## 2019-02-17 VITALS — BP 122/78 | HR 91 | Temp 98.3°F | Wt 290.6 lb

## 2019-02-17 DIAGNOSIS — K5909 Other constipation: Secondary | ICD-10-CM | POA: Diagnosis not present

## 2019-02-17 DIAGNOSIS — M25552 Pain in left hip: Secondary | ICD-10-CM

## 2019-02-17 DIAGNOSIS — I2510 Atherosclerotic heart disease of native coronary artery without angina pectoris: Secondary | ICD-10-CM | POA: Diagnosis not present

## 2019-02-17 DIAGNOSIS — M545 Low back pain, unspecified: Secondary | ICD-10-CM

## 2019-02-17 DIAGNOSIS — Z9889 Other specified postprocedural states: Secondary | ICD-10-CM

## 2019-02-17 DIAGNOSIS — M25551 Pain in right hip: Secondary | ICD-10-CM | POA: Diagnosis not present

## 2019-02-17 DIAGNOSIS — G8929 Other chronic pain: Secondary | ICD-10-CM | POA: Diagnosis not present

## 2019-02-17 DIAGNOSIS — R430 Anosmia: Secondary | ICD-10-CM | POA: Diagnosis not present

## 2019-02-17 DIAGNOSIS — Z9689 Presence of other specified functional implants: Secondary | ICD-10-CM

## 2019-02-17 NOTE — Assessment & Plan Note (Signed)
Chronic anosmia - present when he lived in [place]. Had been discussing treatment for this with [facility] and requests referral to local ENT for further evaluation.

## 2019-02-17 NOTE — Assessment & Plan Note (Signed)
Weight gain noted during recent quarantining. Alli ineffective. Discussed saxenda vs contrave - he will look into both options and let me know if desires trial. Briefly discussed referral to [facility] wellness center however he declines as it is in [place].

## 2019-02-17 NOTE — Assessment & Plan Note (Signed)
Overall improvement after spinal cord stimulator.

## 2019-02-17 NOTE — Progress Notes (Signed)
This visit was conducted in person.  BP 122/78 (BP Location: Right Arm, Patient Position: Sitting, Cuff Size: Large)   Pulse 91   Temp 98.3 F (36.8 C) (Oral)   Wt 290 lb 9 oz (131.8 kg)   SpO2 96%   BMI 38.34 kg/m    CC: referral request Subjective:    Patient ID: Anthony [name], male    DOB: November 27, 1935, 83 y.o.   MRN: [id number]  HPI: Anthony [name] is a 83 y.o. male presenting on 02/17/2019 for Hip Pain (? if needs ortho referral) and Referrals (also wants an ENT and GI referral)   Chronic lumbar pain followed by pain management Dr Lacinda Axon s/p recent spinal cord stimulator early 2020 with benefit. Not regularly taking pain meds other than tylenol PM.   Bilateral posterior hip pain ongoing over several months, acutely worsening. Denies inciting trauma/injury or falls. R leg acutely weak 2 wks ago. No leg numbness.   Also requests GI referral for chronic constipation. Manages this with linzess 290mg  (bid for the past month). Miralax has been ineffective. Psyllium ineffective. Takes colace stool softener without benefit. Taking senna PRN.   Also requests ENT referral for chronic longstanding anosmia. Was discussing chemical cautery with ENT in [place] for treatment of this - would like to review options.  Obesity - 20 lb weight gain noted, denies diet changes. Significant decreased activity due to chronic pain. Orlistat 60mg  not helping.      Relevant past medical, surgical, family and social history reviewed and updated as indicated. Interim medical history since our last visit reviewed. Allergies and medications reviewed and updated. Outpatient Medications Prior to Visit  Medication Sig Dispense Refill  . aspirin EC 81 MG tablet Take 1 tablet (81 mg total) by mouth daily.    . benzonatate (TESSALON) 100 MG capsule Take 1 capsule (100 mg total) by mouth 3 (three) times daily as needed for cough. 30 capsule 0  . Cholecalciferol (VITAMIN D3) 1000 units CAPS Take 1 capsule (1,000 Units  total) by mouth daily. 30 capsule   . diphenhydramine-acetaminophen (TYLENOL PM EXTRA STRENGTH) 25-500 MG TABS tablet Take 1 tablet by mouth at bedtime as needed. (Patient taking differently: Take 1 tablet by mouth at bedtime as needed (for sleep.). )    . docusate sodium (COLACE) 100 MG capsule Take 100 mg by mouth 2 (two) times daily.    . Eszopiclone 3 MG TABS Take 1 tablet (3 mg total) by mouth at bedtime as needed. Take immediately before bedtime 30 tablet 0  . ezetimibe (ZETIA) 10 MG tablet TAKE 1 TABLET(10 MG) BY MOUTH DAILY 90 tablet 3  . fenofibrate 160 MG tablet TAKE 1 TABLET BY MOUTH EVERY DAY 90 tablet 3  . finasteride (PROSCAR) 5 MG tablet Take 1 tablet (5 mg total) by mouth daily. 90 tablet 0  . furosemide (LASIX) 40 MG tablet TAKE 1 TABLET BY MOUTH EVERY DAY. TAKE AN EXTRA TABLET AS NEEDED FOR WEIGHT GAIN OF 3 LBS/ DAY OR 5 LBS/ WEEK 135 tablet 3  . guaiFENesin-codeine (CHERATUSSIN AC) 100-10 MG/5ML syrup Take 5 mLs by mouth 3 (three) times daily as needed for cough (sedation precautions). 120 mL 0  . ketoconazole (NIZORAL) 2 % cream Apply 1 application topically daily as needed (for dermatitits).     Marland Kitchen levothyroxine (SYNTHROID) 175 MCG tablet Take 1 tablet (175 mcg total) by mouth daily before breakfast. 90 tablet 1  . LINZESS 290 MCG CAPS capsule TAKE 1 CAPSULE(290 MCG) BY MOUTH  DAILY 30 MINUTES BEFORE FIRST MEAL OF THE DAY ON AN EMPTY STOMACH 90 capsule 0  . lisinopril (PRINIVIL,ZESTRIL) 10 MG tablet Take 1 tablet (10 mg total) by mouth daily. 90 tablet 3  . loratadine (CLARITIN) 10 MG tablet Take 1 tablet (10 mg total) by mouth daily. (Patient taking differently: Take 10 mg by mouth daily as needed. )    . Multiple Vitamins-Minerals (CENTRUM SILVER PO) Take 1 tablet by mouth daily.    . Multiple Vitamins-Minerals (PRESERVISION AREDS 2 PO) Take 1 tablet by mouth daily.    . nitroGLYCERIN (NITROSTAT) 0.4 MG SL tablet Place 1 tablet (0.4 mg total) under the tongue every 5 (five)  minutes as needed for chest pain. 25 tablet 0  . omega-3 acid ethyl esters (LOVAZA) 1 g capsule Take 2 capsules (2 g total) by mouth 2 (two) times daily. 360 capsule 3  . orlistat (ALLI) 60 MG capsule Take 1 capsule (60 mg total) by mouth daily as needed (diarrhea).    . potassium chloride (K-DUR,KLOR-CON) 10 MEQ tablet TAKE 1 TABLET(10 MEQ) BY MOUTH TWICE DAILY 180 tablet 1  . Psyllium (METAMUCIL FIBER) 51.7 % PACK Take 5-10 mLs by mouth daily.    . tamsulosin (FLOMAX) 0.4 MG CAPS capsule Take 2 capsules (0.8 mg total) by mouth daily. 180 capsule 0   No facility-administered medications prior to visit.      Per HPI unless specifically indicated in ROS section below Review of Systems Objective:    BP 122/78 (BP Location: Right Arm, Patient Position: Sitting, Cuff Size: Large)   Pulse 91   Temp 98.3 F (36.8 C) (Oral)   Wt 290 lb 9 oz (131.8 kg)   SpO2 96%   BMI 38.34 kg/m   Wt Readings from Last 3 Encounters:  02/17/19 290 lb 9 oz (131.8 kg)  11/05/18 269 lb 9 oz (122.3 kg)  10/24/18 267 lb 4.8 oz (121.2 kg)    Physical Exam Vitals signs and nursing note reviewed.  Constitutional:      Appearance: Normal appearance. He is not ill-appearing.  HENT:     Mouth/Throat:     Mouth: Mucous membranes are moist.     Pharynx: No posterior oropharyngeal erythema.  Cardiovascular:     Rate and Rhythm: Normal rate and regular rhythm.     Pulses: Normal pulses.     Heart sounds: Normal heart sounds. No murmur.  Pulmonary:     Effort: Pulmonary effort is normal. No respiratory distress.     Breath sounds: Normal breath sounds. No wheezing, rhonchi or rales.  Musculoskeletal: Normal range of motion.     Right lower leg: No edema.     Left lower leg: No edema.     Comments:  No significant pain midline spine No paraspinous mm tenderness No pain with int/ext rotation at hip. No pain at SIJ, GTB or sciatic notch bilaterally.  Points to posterior bilateral lower back as sites of pain   Skin:    Findings: No erythema or rash.  Neurological:     Mental Status: He is alert.       Results for orders placed or performed in visit on 67/12/45  Basic metabolic panel  Result Value Ref Range   Sodium 137 135 - 145 mEq/L   Potassium 4.0 3.5 - 5.1 mEq/L   Chloride 103 96 - 112 mEq/L   CO2 29 19 - 32 mEq/L   Glucose, Bld 116 (H) 70 - 99 mg/dL   BUN 28 (H)  6 - 23 mg/dL   Creatinine, Ser 1.22 0.40 - 1.50 mg/dL   Calcium 10.7 (H) 8.4 - 10.5 mg/dL   GFR 56.98 (L) >60.00 mL/min  T4, free  Result Value Ref Range   Free T4 0.87 0.60 - 1.60 ng/dL  TSH  Result Value Ref Range   TSH 0.72 0.35 - 4.50 uIU/mL    Assessment & Plan:   Problem List Items Addressed This Visit    Severe obesity (BMI 35.0-39.9) with comorbidity ([place])    Weight gain noted during recent quarantining. Alli ineffective. Discussed saxenda vs contrave - he will look into both options and let me know if desires trial. Briefly discussed referral to [facility] weight wellness center however he declines as it is in [place].       S/P insertion of spinal cord stimulator   Chronic lower back pain    Overall improvement after spinal cord stimulator.       Chronic constipation    Ongoing trouble despite regular use of linzess, has recently increased to BID dosing. Miralax, psyllium, colace ineffective. Uses senna PRN. Requests GI eval for further evaluation of chronic constipation.  Prior thought to be narcotic related however now he has been able to taper off all narcotics and constipation continues.       Relevant Orders   Ambulatory referral to Gastroenterology   Bilateral hip pain - Primary    Not true hip pain - anticipate more related to known chronic lower back pain as well as related to recent weight gained. Encouraged ongoing efforts at sustainable weight loss.       Relevant Orders   DG HIPS BILAT WITH PELVIS MIN 5 VIEWS   Anosmia    Chronic anosmia - present when he lived in [place]. Had been  discussing treatment for this with [facility] and requests referral to local ENT for further evaluation.       Relevant Orders   Ambulatory referral to ENT       No orders of the defined types were placed in this encounter.  Orders Placed This Encounter  Procedures  . DG HIPS BILAT WITH PELVIS MIN 5 VIEWS    Order Specific Question:   Reason for Exam (SYMPTOM  OR DIAGNOSIS REQUIRED)    Answer:   bilateral hip/lower back pain    Order Specific Question:   Preferred imaging location?    Answer:   [facility]    Order Specific Question:   Radiology Contrast Protocol - do NOT remove file path    Answer:   [email or web address]  . Ambulatory referral to Gastroenterology    Referral Priority:   Routine    Referral Type:   Consultation    Referral Reason:   Specialty Services Required    Number of Visits Requested:   1  . Ambulatory referral to ENT    Referral Priority:   Routine    Referral Type:   Consultation    Referral Reason:   Specialty Services Required    Requested Specialty:   Otolaryngology    Number of Visits Requested:   1    Follow up plan: No follow-ups on file.  Ria Bush, MD

## 2019-02-17 NOTE — Assessment & Plan Note (Signed)
Not true hip pain - anticipate more related to known chronic lower back pain as well as related to recent weight gained. Encouraged ongoing efforts at sustainable weight loss.

## 2019-02-17 NOTE — Patient Instructions (Addendum)
Work on weight loss.  Pelvic xray today to evaluate hips.  We will refer you to GI and ENT today.  Look into saxenda daily shot for weight loss. Other option may be contrave.

## 2019-02-17 NOTE — Assessment & Plan Note (Signed)
Ongoing trouble despite regular use of linzess, has recently increased to BID dosing. Miralax, psyllium, colace ineffective. Uses senna PRN. Requests GI eval for further evaluation of chronic constipation.  Prior thought to be narcotic related however now he has been able to taper off all narcotics and constipation continues.

## 2019-02-19 ENCOUNTER — Telehealth: Payer: Self-pay | Admitting: *Deleted

## 2019-02-19 NOTE — Telephone Encounter (Signed)
Patient called stating that he has talked with his insurance companies and they stated that they will not cover Saxenda or contrave at this point. Patient stated that they told him they may be able to cover it if the doctor contacted them and told them how badly he needs the medication. Patient requested that his doctor contact the insurance company at [phone] and plead his case.

## 2019-02-20 NOTE — Telephone Encounter (Signed)
plz submit PA for contrave to see if any difference.  If not covered, it may be around $150 with goodRx [facility] coupon.

## 2019-02-20 NOTE — Telephone Encounter (Signed)
PT returned call, he was unsure who called and left a msg for cb.

## 2019-02-21 NOTE — Telephone Encounter (Signed)
Patient returned call from the office   C/B # [phone]

## 2019-02-24 ENCOUNTER — Encounter: Payer: Self-pay | Admitting: Family Medicine

## 2019-02-24 MED ORDER — CONTRAVE 8-90 MG PO TB12: ORAL_TABLET | ORAL | 0 refills | Status: DC

## 2019-02-24 NOTE — Telephone Encounter (Signed)
contrave sent to pharmacy. plz schedule visit in 1 month for f/u weight

## 2019-02-24 NOTE — Telephone Encounter (Addendum)
Left message on vm per dpr relaying Dr. Synthia Innocent message.    Asked pt to call back to schedule 1 mo wt  F/u OV.

## 2019-02-24 NOTE — Telephone Encounter (Signed)
Received MyChart message that pt's insurance will cover Contrave.

## 2019-02-25 NOTE — Telephone Encounter (Signed)
Anthony [name] spoke with patient. He is now requesting PA filled for saxenda as he'd prefer to try this medication.  May start saxenda 0.6mg  injection daily for 1 wk then increase to 1.2mg  daily for 1 wk then increase to 1.8mg  daily - and titrate as tolerated to full dose which will be 3mg  daily. Full dose would be 5 pens per month.

## 2019-02-25 NOTE — Telephone Encounter (Signed)
Best number [phone] Pt called checking on appointment for 7/2 This is schedule as nurse visit   Does he need nurse visit or office visit with dr g

## 2019-02-26 DIAGNOSIS — J310 Chronic rhinitis: Secondary | ICD-10-CM | POA: Diagnosis not present

## 2019-02-26 DIAGNOSIS — H6123 Impacted cerumen, bilateral: Secondary | ICD-10-CM | POA: Diagnosis not present

## 2019-02-26 DIAGNOSIS — R430 Anosmia: Secondary | ICD-10-CM | POA: Diagnosis not present

## 2019-02-26 NOTE — Telephone Encounter (Addendum)
Submitted PA for Saxenda.  Decision pending.  Also, pt provided phn # [phone] to call to provide more info for PA if needed.

## 2019-02-26 NOTE — Telephone Encounter (Signed)
Spoke with pt about the NV. Notified him he did not need appt and it was c/x. Pt aware.   Also, spoke with pt about the [name] and Contrave.  Pt prefers Saxenda.  Notified pt I will submit PA for Saxenda and will let him know the decision.  Pt verbalizes understanding. [See TE, 02/19/19 for PA info.]

## 2019-02-27 ENCOUNTER — Ambulatory Visit: Payer: Medicare Other

## 2019-02-27 NOTE — Telephone Encounter (Signed)
Left message on vm per dpr notifying pt PA for [place]gy was denied by insurance.  Also, relayed Dr. Synthia Innocent message.

## 2019-02-27 NOTE — Telephone Encounter (Signed)
plz notify patient request was denied. Will need to decide if affordable without insurance.

## 2019-02-27 NOTE — Telephone Encounter (Addendum)
Received faxed PA denial.  Reason given:  Drugs, when used for anorexia, weight loss or weight gain, are excluded from coverage under Medicare rules.  Fyi to Dr. Darnell Level.

## 2019-03-03 ENCOUNTER — Other Ambulatory Visit: Payer: Self-pay | Admitting: Family Medicine

## 2019-03-03 NOTE — Telephone Encounter (Signed)
Noted  

## 2019-03-03 NOTE — Telephone Encounter (Signed)
Patient called today and stated he started taking the Contrave today and needed to schedule a weight check for 30 days .  He is scheduled on 04/02/2019

## 2019-03-03 NOTE — Telephone Encounter (Signed)
Linzess Last filled:  01/08/19, #90 Last OV:  02/17/19, f/u Next OV:  05/14/19, 3-4 mo f/u

## 2019-03-04 ENCOUNTER — Telehealth: Payer: Self-pay | Admitting: *Deleted

## 2019-03-21 ENCOUNTER — Other Ambulatory Visit: Payer: Self-pay | Admitting: Family Medicine

## 2019-04-02 ENCOUNTER — Ambulatory Visit: Payer: Medicare Other

## 2019-04-02 ENCOUNTER — Telehealth: Payer: Self-pay

## 2019-04-02 ENCOUNTER — Other Ambulatory Visit: Payer: Self-pay

## 2019-04-02 MED ORDER — CONTRAVE 8-90 MG PO TB12: 1.0000 | ORAL_TABLET | Freq: Two times a day (BID) | ORAL | 0 refills | Status: AC

## 2019-04-02 NOTE — Telephone Encounter (Signed)
Sent in

## 2019-04-02 NOTE — Telephone Encounter (Signed)
Contrave Last rx:  02/24/19, #60 Last OV:  02/17/19 Next OV:  05/14/19, 3-4 mo f/u

## 2019-04-02 NOTE — Progress Notes (Signed)
Pt scheduled for a NV for 1 mo wt check after starting Contrave.  Wt documented in 'Vital Signs'.

## 2019-04-02 NOTE — Telephone Encounter (Signed)
Left message on vm per dpr asking pt to call back to schedule 1 mo wt f/u OV.

## 2019-04-03 ENCOUNTER — Encounter: Payer: Self-pay | Admitting: Family Medicine

## 2019-04-03 NOTE — Telephone Encounter (Signed)
Left message on vm per dpr asking pt to call back to schedule 1 mo wt f/u OV.

## 2019-04-07 NOTE — Telephone Encounter (Signed)
Pt scheduled on 05/07/19 at 3:00.

## 2019-04-10 MED ORDER — NALTREXONE HCL 50 MG PO TABS: 25.0000 mg | ORAL_TABLET | Freq: Two times a day (BID) | ORAL | 0 refills | Status: DC

## 2019-04-10 MED ORDER — NALTREXONE HCL 50 MG PO TABS: 25.0000 mg | ORAL_TABLET | Freq: Every day | ORAL | 0 refills | Status: DC

## 2019-04-10 MED ORDER — BUPROPION HCL ER (SR) 100 MG PO TB12: 100.0000 mg | ORAL_TABLET | Freq: Two times a day (BID) | ORAL | 3 refills | Status: DC

## 2019-04-15 ENCOUNTER — Other Ambulatory Visit: Payer: Self-pay

## 2019-04-15 DIAGNOSIS — N2889 Other specified disorders of kidney and ureter: Secondary | ICD-10-CM | POA: Insufficient documentation

## 2019-04-15 DIAGNOSIS — J302 Other seasonal allergic rhinitis: Secondary | ICD-10-CM | POA: Insufficient documentation

## 2019-04-15 DIAGNOSIS — Q619 Cystic kidney disease, unspecified: Secondary | ICD-10-CM | POA: Insufficient documentation

## 2019-04-15 DIAGNOSIS — M1990 Unspecified osteoarthritis, unspecified site: Secondary | ICD-10-CM | POA: Insufficient documentation

## 2019-04-16 ENCOUNTER — Ambulatory Visit (INDEPENDENT_AMBULATORY_CARE_PROVIDER_SITE_OTHER): Payer: Medicare Other | Admitting: Gastroenterology

## 2019-04-16 ENCOUNTER — Other Ambulatory Visit: Payer: Self-pay

## 2019-04-16 ENCOUNTER — Encounter: Payer: Self-pay | Admitting: Gastroenterology

## 2019-04-16 VITALS — BP 97/57 | HR 97 | Temp 95.6°F | Ht 73.0 in | Wt 293.8 lb

## 2019-04-16 DIAGNOSIS — K5900 Constipation, unspecified: Secondary | ICD-10-CM

## 2019-04-16 MED ORDER — POLYETHYLENE GLYCOL 3350 17 G PO PACK: 17.0000 g | PACK | Freq: Every day | ORAL | 1 refills | Status: AC

## 2019-04-16 MED ORDER — LUBIPROSTONE 24 MCG PO CAPS: 24.0000 ug | ORAL_CAPSULE | Freq: Every day | ORAL | 2 refills | Status: DC

## 2019-04-17 NOTE — Progress Notes (Signed)
Vonda Antigua [address]  [place]  [place], [place] [address]  Main: [phone]  Fax: [phone]   Gastroenterology Consultation  Referring Provider:     Ria Bush, MD Primary Care Physician:  Ria Bush, MD Reason for Consultation:     Constipation        HPI:    Chief Complaint  Patient presents with  . New Patient (Initial Visit)    Chronic Constipation    MYRON STANKOVICH is a 83 y.o. y/o male referred for consultation & management  by Dr. Ria Bush, MD.  Patient reports chronic history of constipation for years.  Has been on multiple medications, including MiraLAX, over-the-counter stool softeners, currently on Linzess.  Taking Linzess 290 mcg twice daily and reports even with this sometimes he goes 2 to 3 days without a bowel movement but then will have some days where he has 5 soft bowel movements in 1 day.  No blood in stool. The patient denies abdominal or flank pain, anorexia, nausea or vomiting, dysphagia, change in bowel habits or black or bloody stools or weight loss.  He reports his last colonoscopy was when he was 83 years of age and it was normal with no polyps found.  He is not interested in repeat colonoscopies.  Denies any family history of colon cancer.  He denies previously being on Amitiza.  However, was able to find a GI note from July 2018 in [place] that states Amitiza was given at the time due to his chronic constipation.  According to their note they report that he had a colonoscopy in 2007.  "Dr. Meridee Score, done for screening, and slow transit constipation.  Internal hemorrhoids, hypertrophied anal papilla, multiple diverticula in the sigmoid colon, otherwise normal.  Repeat in 10 years."  Past Medical History:  Diagnosis Date  . Anosmia 1980s  . Atrial flutter ([place]) 2018   had an ablation with dr. Erick Alley  . CAD (coronary artery disease)   . Cancer ([employer])    skin cancer  . Chronic constipation   . Chronic insomnia    . Chronic lower back pain    s/p spine stimulator placement  . Complication of anesthesia    when under general, he woke up agitated and unable to be held down  . Diastolic CHF ([place])   . Dyspnea    had prior to having cardiac stent placed  . History of diverticulitis 2017  . History of pneumonia 2013  . Hyperlipidemia   . Hypertension   . Hypothyroidism   . Obesity, Class II, BMI 35-39.9, with comorbidity   . OSA on CPAP     Past Surgical History:  Procedure Laterality Date  . A-FLUTTER ABLATION N/A 05/07/2017   Procedure: A-Flutter Ablation;  Surgeon: Deboraha Sprang, MD;  Location: [facility];  Service: Cardiovascular;  Laterality: N/A;  . CARDIOVASCULAR STRESS TEST  11/2017   no ischemia, low risk study  . COLONOSCOPY  2007   normal per prior PCP records, rpt 10 yrs (Dr Osie Cheeks)  . ELBOW SURGERY Right    ulnar nerve decompression.  PT DOES NOT RECALL THIS PROCEDURE  . LAMINECTOMY THORACIC SPINE W/ PLACEMENT SPINAL CORD STIMULATOR  08/2018   and removal of prior spine stimulator (Dr Lacinda Axon)  . NASAL SINUS SURGERY     nasal polyps. done a long time ago  . PERCUTANEOUS CORONARY STENT INTERVENTION (PCI-S)  2013   EF55%, 70% mid LAD, 99% mid RCA, mild MR, elev LVEDP,  DES to mid LAD. RCA is nondominant  . RADIOFREQUENCY ABLATION  06/2017   lumbar region [facility])   . REPLACEMENT TOTAL KNEE BILATERAL Bilateral 2000s  . SPINAL CORD STIMULATOR IMPLANT  06/2016  . SPINAL CORD STIMULATOR INSERTION N/A 09/09/2018   Procedure: LUMBAR SPINAL CORD STIMULATOR LEAD AND BATTERY REMOVAL AND LAMINECTOMY FOR PLACEMENT OF PADDLE;  Surgeon: Deetta Perla, MD;  Location: [facility];  Service: Neurosurgery;  Laterality: N/A;  . SPINAL CORD STIMULATOR INSERTION N/A 09/16/2018   Procedure: PLACEMENT OF SPINAL CORD STIMULATOR BATTERY;  Surgeon: Deetta Perla, MD;  Location: [facility];  Service: Neurosurgery;  Laterality: N/A;    Prior to Admission medications   Medication Sig Start Date End Date  Taking? Authorizing Provider  aspirin EC 81 MG tablet Take 1 tablet (81 mg total) by mouth daily. 11/05/18  Yes Ria Bush, MD  Azelastine HCl 0.15 % SOLN U 1 TO 2 SPRAYS IEN BID 02/26/19  Yes [provider]  benzonatate (TESSALON) 100 MG capsule Take 1 capsule (100 mg total) by mouth 3 (three) times daily as needed for cough. 11/05/18  Yes Ria Bush, MD  Cholecalciferol (VITAMIN D3) 1000 units CAPS Take 1 capsule (1,000 Units total) by mouth daily. 05/22/18  Yes Ria Bush, MD  diphenhydramine-acetaminophen (TYLENOL PM EXTRA STRENGTH) 25-500 MG TABS tablet Take 1 tablet by mouth at bedtime as needed. Patient taking differently: Take 1 tablet by mouth at bedtime as needed (for sleep.).  04/16/17  Yes Ria Bush, MD  docusate sodium (COLACE) 100 MG capsule Take 100 mg by mouth 2 (two) times daily.   Yes [provider]  Eszopiclone 3 MG TABS Take 1 tablet (3 mg total) by mouth at bedtime as needed. Take immediately before bedtime 10/29/18  Yes Ria Bush, MD  ezetimibe (ZETIA) 10 MG tablet TAKE 1 TABLET(10 MG) BY MOUTH DAILY 11/05/18  Yes Ria Bush, MD  fenofibrate 160 MG tablet TAKE 1 TABLET BY MOUTH EVERY DAY 12/13/18  Yes Ria Bush, MD  finasteride (PROSCAR) 5 MG tablet TAKE 1 TABLET(5 MG) BY MOUTH DAILY 03/21/19  Yes Ria Bush, MD  furosemide (LASIX) 40 MG tablet TAKE 1 TABLET BY MOUTH EVERY DAY. TAKE AN EXTRA TABLET AS NEEDED FOR WEIGHT GAIN OF 3 LBS/ DAY OR 5 LBS/ WEEK 11/05/18  Yes Ria Bush, MD  ketoconazole (NIZORAL) 2 % cream Apply 1 application topically daily as needed (for dermatitits).    Yes [provider]  ketoconazole (NIZORAL) 2 % shampoo APP EXT TO THE SKIN 3 TIMES WEEKLY 03/18/19  Yes [provider]  levothyroxine (SYNTHROID) 175 MCG tablet Take 1 tablet (175 mcg total) by mouth daily before breakfast. 02/05/19  Yes Ria Bush, MD  lisinopril (PRINIVIL,ZESTRIL) 10 MG tablet Take 1  tablet (10 mg total) by mouth daily. 11/05/18  Yes Ria Bush, MD  loratadine (CLARITIN) 10 MG tablet Take 1 tablet (10 mg total) by mouth daily. Patient taking differently: Take 10 mg by mouth daily as needed.  06/26/17  Yes Ria Bush, MD  Multiple Vitamins-Minerals (CENTRUM SILVER PO) Take 1 tablet by mouth daily.   Yes [provider]  Multiple Vitamins-Minerals (PRESERVISION AREDS 2 PO) Take 1 tablet by mouth daily.   Yes [provider]  omega-3 acid ethyl esters (LOVAZA) 1 g capsule Take 2 capsules (2 g total) by mouth 2 (two) times daily. 11/05/18  Yes Ria Bush, MD  potassium chloride (K-DUR,KLOR-CON) 10 MEQ tablet TAKE 1 TABLET(10 MEQ) BY MOUTH TWICE DAILY 06/03/18  Yes Danise Mina,  Garlon Hatchet, MD  Psyllium (METAMUCIL FIBER) 51.7 % PACK Take 5-10 mLs by mouth daily. 10/18/17  Yes Ria Bush, MD  buPROPion [facility] SR) 100 MG 12 hr tablet Take 1 tablet (100 mg total) by mouth 2 (two) times daily. Patient not taking: Reported on 04/16/2019 04/10/19   Ria Bush, MD  lubiprostone (AMITIZA) 24 MCG capsule Take 1 capsule (24 mcg total) by mouth daily. 04/16/19 05/16/19  Virgel Manifold, MD  naltrexone (DEPADE) 50 MG tablet Take 0.5 tablets (25 mg total) by mouth 2 (two) times daily. 04/10/19   Ria Bush, MD  nitroGLYCERIN (NITROSTAT) 0.4 MG SL tablet Place 1 tablet (0.4 mg total) under the tongue every 5 (five) minutes as needed for chest pain. Patient not taking: Reported on 04/16/2019 12/01/17   Ria Bush, MD  polyethylene glycol (MIRALAX) 17 g packet Take 17 g by mouth daily. 04/16/19 05/16/19  Virgel Manifold, MD  tamsulosin (FLOMAX) 0.4 MG CAPS capsule TAKE 2 CAPSULES(0.8 MG) BY MOUTH DAILY Patient not taking: Reported on 04/16/2019 03/21/19   Ria Bush, MD    Family History  Problem Relation Age of Onset  . Stroke Mother   . Kidney disease Father   . Leukemia Brother   . Stroke Sister   . Diabetes Neg Hx       Social History   Tobacco Use  . Smoking status: Never Smoker  . Smokeless tobacco: Never Used  Substance Use Topics  . Alcohol use: No    Comment: QUIT DRINKING  . Drug use: No    Allergies as of 04/16/2019 - Review Complete 04/16/2019  Allergen Reaction Noted  . Questran [cholestyramine]  04/09/2017  . Atorvastatin  04/09/2017  . Gemfibrozil Other (See Comments) 04/09/2017  . Metformin and related  04/09/2017  . Rosuvastatin  04/09/2017    Review of Systems:    All systems reviewed and negative except where noted in HPI.   Physical Exam:  BP (!) 97/57   Pulse 97   Temp (!) 95.6 F (35.3 C) (Skin)   Ht 6\' 1"  (1.854 m)   Wt 293 lb 12.8 oz (133.3 kg)   BMI 38.76 kg/m  No LMP for male patient. Psych:  Alert and cooperative. Normal mood and affect. General:   Alert,  Well-developed, well-nourished, pleasant and cooperative in NAD Head:  Normocephalic and atraumatic. Eyes:  Sclera clear, no icterus.   Conjunctiva pink. Ears:  Normal auditory acuity. Nose:  No deformity, discharge, or lesions. Mouth:  No deformity or lesions,oropharynx pink & moist. Neck:  Supple; no masses or thyromegaly. Abdomen:  Normal bowel sounds.  No bruits.  Soft, non-tender and non-distended without masses, hepatosplenomegaly or hernias noted.  No guarding or rebound tenderness.    Msk:  Symmetrical without gross deformities. Good, equal movement & strength bilaterally. Pulses:  Normal pulses noted. Extremities:  No clubbing or edema.  No cyanosis. Neurologic:  Alert and oriented x3;  grossly normal neurologically. Skin:  Intact without significant lesions or rashes. No jaundice. Lymph Nodes:  No significant cervical adenopathy. Psych:  Alert and cooperative. Normal mood and affect.   Labs: CBC    Component Value Date/Time   WBC 8.2 08/19/2018 0746   RBC 4.26 08/19/2018 0746   HGB 13.4 08/19/2018 0746   HGB 14.4 05/03/2017 1037   HCT 40.1 08/19/2018 0746   HCT 43.3 05/03/2017 1037    PLT 300 08/19/2018 0746   PLT 272 05/03/2017 1037   MCV 94.1 08/19/2018 0746   MCV 93 05/03/2017  1037   MCH 31.5 08/19/2018 0746   MCHC 33.4 08/19/2018 0746   RDW 13.8 08/19/2018 0746   RDW 15.0 05/03/2017 1037   LYMPHSABS 3.0 03/08/2018 1240   LYMPHSABS 2.9 05/03/2017 1037   MONOABS 0.8 03/08/2018 1240   EOSABS 0.6 03/08/2018 1240   EOSABS 0.6 (H) 05/03/2017 1037   BASOSABS 0.2 (H) 03/08/2018 1240   BASOSABS 0.2 05/03/2017 1037   CMP     Component Value Date/Time   NA 137 12/17/2018 1101   NA 139 05/03/2017 1037   K 4.0 12/17/2018 1101   CL 103 12/17/2018 1101   CO2 29 12/17/2018 1101   GLUCOSE 116 (H) 12/17/2018 1101   BUN 28 (H) 12/17/2018 1101   BUN 23 05/03/2017 1037   CREATININE 1.22 12/17/2018 1101   CALCIUM 10.7 (H) 12/17/2018 1101   PROT 7.9 10/24/2018 1115   ALBUMIN 4.4 10/24/2018 1115   AST 18 10/24/2018 1115   ALT 18 10/24/2018 1115   ALKPHOS 61 10/24/2018 1115   BILITOT 0.6 10/24/2018 1115   GFRNONAA 54 (L) 08/22/2018 1152   GFRAA >60 08/22/2018 1152    Imaging Studies: No results found.  Assessment and Plan:   PAYDEN DOCTER is a 83 y.o. y/o male has been referred for chronic constipation  Since patient has unsatisfactory results with Linzess twice daily, which is high dose at this point, we will switch him to Amitiza.  However, patient does not eat a high-fiber diet, and is also having to use stool softeners over-the-counter as needed along with the Linzess, will also start him on MiraLAX along with the Amitiza.  Amitiza once daily, along with MiraLAX once daily.  If this does not lead to good results, increase Amitiza to twice daily  High-fiber diet MiraLAX or Metamucil daily with goal of 1-2 soft bowel movements daily.  If not at goal, patient instructed to increase dose to twice daily.  If loose stools with the medication, patient asked to decrease the medication to every other day, or half dose daily.  Patient verbalized understanding   Patient refuses repeat screening colonoscopy and understands underlying risks of malignancy.  However, his symptoms are not acute, he does not have any alarm symptoms, and therefore his current constipation symptoms are due to chronic constipation and unlikely to be due to obstructive pathology.  Labs reassuring  Dr Vonda Antigua  Speech recognition software was used to dictate the above note.

## 2019-05-07 ENCOUNTER — Other Ambulatory Visit: Payer: Self-pay

## 2019-05-07 ENCOUNTER — Encounter: Payer: Self-pay | Admitting: Family Medicine

## 2019-05-07 ENCOUNTER — Ambulatory Visit (INDEPENDENT_AMBULATORY_CARE_PROVIDER_SITE_OTHER): Payer: Medicare Other | Admitting: Family Medicine

## 2019-05-07 DIAGNOSIS — G250 Essential tremor: Secondary | ICD-10-CM | POA: Diagnosis not present

## 2019-05-07 DIAGNOSIS — I2510 Atherosclerotic heart disease of native coronary artery without angina pectoris: Secondary | ICD-10-CM | POA: Diagnosis not present

## 2019-05-07 DIAGNOSIS — Z23 Encounter for immunization: Secondary | ICD-10-CM

## 2019-05-07 NOTE — Progress Notes (Signed)
This visit was conducted in person.  BP 120/80 (BP Location: Left Arm, Patient Position: Sitting, Cuff Size: Large)   Pulse 84   Temp 97.7 F (36.5 C) (Tympanic)   Ht 6\' 1"  (1.854 m)   Wt 293 lb 1 oz (132.9 kg)   SpO2 95%   BMI 38.66 kg/m    CC: 1 mo f/u visit Subjective:    Patient ID: Anthony [name], male    DOB: 08-19-1936, 83 y.o.   MRN: [id number]  HPI: Anthony [name] is a 83 y.o. male presenting on 05/07/2019 for Follow-up (Here for 1 mo wt f/u.)   Starting weight 290.5 lbs (02/17/2019) Today's weight 293 lbs 05/07/19  Came in 03/2019 for nurse visit for weight - 290lbs.   Orlistat 60mg  not helpful.  saxenda denied by insurance.  contrave was unaffordable so we transitioned to individual wellbutrin 100mg  bid and naltrexone 50mg  1/2 tab BID. Tolerating medications well, no upset stomach, no insomnia. He feels medicines are helping decrease appetite (not as hungry).   20 lb weight gain since covid pandemic.  Diet: limiting portion sizes. Some fruits/vegetables.   Activity limited by worsened L buttock and lower back pain. Did have spine stimulator early 2020 (Dr Lacinda Axon) with benefit. Planning to f/u over next few weeks.   He has ordered new walker and is hopeful this will let him [place] walking [place] the mall.   Notes 1 yr h/o tremor more noticeable with writing or using utensils. Father with h/o tremor as well.      Relevant past medical, surgical, family and social history reviewed and updated as indicated. Interim medical history since our last visit reviewed. Allergies and medications reviewed and updated. Outpatient Medications Prior to Visit  Medication Sig Dispense Refill  . aspirin EC 81 MG tablet Take 1 tablet (81 mg total) by mouth daily.    . Azelastine HCl 0.15 % SOLN U 1 TO 2 SPRAYS IEN BID    . benzonatate (TESSALON) 100 MG capsule Take 1 capsule (100 mg total) by mouth 3 (three) times daily as needed for cough. 30 capsule 0  . buPROPion (WELLBUTRIN  SR) 100 MG 12 hr tablet Take 1 tablet (100 mg total) by mouth 2 (two) times daily. 60 tablet 3  . Cholecalciferol (VITAMIN D3) 1000 units CAPS Take 1 capsule (1,000 Units total) by mouth daily. 30 capsule   . diphenhydramine-acetaminophen (TYLENOL PM EXTRA STRENGTH) 25-500 MG TABS tablet Take 1 tablet by mouth at bedtime as needed. (Patient taking differently: Take 1 tablet by mouth at bedtime as needed (for sleep.). )    . docusate sodium (COLACE) 100 MG capsule Take 100 mg by mouth 2 (two) times daily.    . Eszopiclone 3 MG TABS Take 1 tablet (3 mg total) by mouth at bedtime as needed. Take immediately before bedtime 30 tablet 0  . ezetimibe (ZETIA) 10 MG tablet TAKE 1 TABLET(10 MG) BY MOUTH DAILY 90 tablet 3  . fenofibrate 160 MG tablet TAKE 1 TABLET BY MOUTH EVERY DAY 90 tablet 3  . finasteride (PROSCAR) 5 MG tablet TAKE 1 TABLET(5 MG) BY MOUTH DAILY 90 tablet 1  . furosemide (LASIX) 40 MG tablet TAKE 1 TABLET BY MOUTH EVERY DAY. TAKE AN EXTRA TABLET AS NEEDED FOR WEIGHT GAIN OF 3 LBS/ DAY OR 5 LBS/ WEEK 135 tablet 3  . ketoconazole (NIZORAL) 2 % cream Apply 1 application topically daily as needed (for dermatitits).     Marland Kitchen ketoconazole (NIZORAL) 2 %  shampoo APP EXT TO THE SKIN 3 TIMES WEEKLY    . levothyroxine (SYNTHROID) 175 MCG tablet Take 1 tablet (175 mcg total) by mouth daily before breakfast. 90 tablet 1  . lisinopril (PRINIVIL,ZESTRIL) 10 MG tablet Take 1 tablet (10 mg total) by mouth daily. 90 tablet 3  . loratadine (CLARITIN) 10 MG tablet Take 1 tablet (10 mg total) by mouth daily. (Patient taking differently: Take 10 mg by mouth daily as needed. )    . lubiprostone (AMITIZA) 24 MCG capsule Take 1 capsule (24 mcg total) by mouth daily. 30 capsule 2  . Multiple Vitamins-Minerals (CENTRUM SILVER PO) Take 1 tablet by mouth daily.    . Multiple Vitamins-Minerals (PRESERVISION AREDS 2 PO) Take 1 tablet by mouth daily.    . naltrexone (DEPADE) 50 MG tablet Take 0.5 tablets (25 mg total) by  mouth 2 (two) times daily. 90 tablet 0  . nitroGLYCERIN (NITROSTAT) 0.4 MG SL tablet Place 1 tablet (0.4 mg total) under the tongue every 5 (five) minutes as needed for chest pain. 25 tablet 0  . omega-3 acid ethyl esters (LOVAZA) 1 g capsule Take 2 capsules (2 g total) by mouth 2 (two) times daily. 360 capsule 3  . polyethylene glycol (MIRALAX) 17 g packet Take 17 g by mouth daily. 30 packet 1  . potassium chloride (K-DUR,KLOR-CON) 10 MEQ tablet TAKE 1 TABLET(10 MEQ) BY MOUTH TWICE DAILY 180 tablet 1  . Psyllium (METAMUCIL FIBER) 51.7 % PACK Take 5-10 mLs by mouth daily.    . tamsulosin (FLOMAX) 0.4 MG CAPS capsule TAKE 2 CAPSULES(0.8 MG) BY MOUTH DAILY 180 capsule 1   No facility-administered medications prior to visit.      Per HPI unless specifically indicated in ROS section below Review of Systems Objective:    BP 120/80 (BP Location: Left Arm, Patient Position: Sitting, Cuff Size: Large)   Pulse 84   Temp 97.7 F (36.5 C) (Tympanic)   Ht 6\' 1"  (1.854 m)   Wt 293 lb 1 oz (132.9 kg)   SpO2 95%   BMI 38.66 kg/m   Wt Readings from Last 3 Encounters:  05/07/19 293 lb 1 oz (132.9 kg)  04/16/19 293 lb 12.8 oz (133.3 kg)  04/02/19 290 lb 1 oz (131.6 kg)    Physical Exam Vitals signs and nursing note reviewed.  Constitutional:      General: He is not in acute distress.    Appearance: Normal appearance. He is obese. He is not ill-appearing.  HENT:     Mouth/Throat:     Mouth: Mucous membranes are moist.     Pharynx: No posterior oropharyngeal erythema.  Cardiovascular:     Rate and Rhythm: Normal rate and regular rhythm.     Pulses: Normal pulses.     Heart sounds: Normal heart sounds. No murmur.  Pulmonary:     Effort: Pulmonary effort is normal. No respiratory distress.     Breath sounds: Normal breath sounds. No wheezing, rhonchi or rales.  Neurological:     Mental Status: He is alert.     Comments: Action tremor present, worse with outstretched hands and at end of goal  directed movements (FTN)  Psychiatric:        Mood and Affect: Mood normal.        Behavior: Behavior normal.       Results for orders placed or performed in visit on [id number]  Basic metabolic panel  Result Value Ref Range   Sodium 137 135 - 145 mEq/L  Potassium 4.0 3.5 - 5.1 mEq/L   Chloride 103 96 - 112 mEq/L   CO2 29 19 - 32 mEq/L   Glucose, Bld 116 (H) 70 - 99 mg/dL   BUN 28 (H) 6 - 23 mg/dL   Creatinine, Ser 1.22 0.40 - 1.50 mg/dL   Calcium 10.7 (H) 8.4 - 10.5 mg/dL   GFR 56.98 (L) >60.00 mL/min  T4, free  Result Value Ref Range   Free T4 0.87 0.60 - 1.60 ng/dL  TSH  Result Value Ref Range   TSH 0.72 0.35 - 4.50 uIU/mL   Assessment & Plan:   Problem List Items Addressed This Visit    Severe obesity (BMI 35.0-39.9) with comorbidity ([place])    No significant weight loss noted despite wellbutrin and naltrexone. Discussed if no progress made over next 1-2 months will recommend stopping medications. Reviewed healthy diet choices - he will continue decreasing portion sizes and work on limiting sweetened beverages. Discussed importance of regular exercise routine - he is expecting new walker which will facilitate his ability to go to mall for daily walking routine. RTC 1 mo f/u visit.       Essential tremor    Anticipate tremor noted over the past year is due to benign essential tremor. He does have fmhx tremor. Discussed treatment options, as not too bothersome for him, recommended against additional medication to treat this. He will continue to monitor. Tremor may be worsened by wellbutrin (although he states present prior to starting medication).        Other Visit Diagnoses    Need for influenza vaccination    -  Primary   Relevant Orders   Flu Vaccine QUAD High Dose(Fluad) (Completed)       No orders of the defined types were placed in this encounter.  Orders Placed This Encounter  Procedures  . Flu Vaccine QUAD High Dose(Fluad)    Follow up plan: Return in  about 4 weeks (around 06/04/2019) for follow up visit.  Ria Bush, MD

## 2019-05-07 NOTE — Patient Instructions (Addendum)
Work on regular exercise routine. I'm glad you have new walker coming.  AHA recommends 150 min/wk of moderate intensity aerobic exercise. Slowly build up to this.  Continue wellbutrin and naltrexone for now, will recheck in 1 month.  Return in 1 month for follow up visit.   Essential Tremor A tremor is trembling or shaking that a person cannot control. Most tremors affect the hands or arms. Tremors can also affect the head, vocal cords, legs, and other parts of the body. Essential tremor is a tremor without a known cause. Usually, it occurs while a person is trying to perform an action. It tends to get worse gradually as a person ages. What are the causes? The cause of this condition is not known. What increases the risk? You are more likely to develop this condition if:  You have a family member with essential tremor.  You are age 81 or older.  You take certain medicines. What are the signs or symptoms? The main sign of a tremor is a rhythmic shaking of certain parts of your body that is uncontrolled and unintentional. You may:  Have difficulty eating with a spoon or fork.  Have difficulty writing.  Nod your head up and down or side to side.  Have a quivering voice. The shaking may:  Get worse over time.  Come and go.  Be more noticeable on one side of your body.  Get worse due to stress, fatigue, caffeine, and extreme heat or cold. How is this diagnosed? This condition may be diagnosed based on:  Your symptoms and medical history.  A physical exam. There is no single test to diagnose an essential tremor. However, your health care provider may order tests to rule out other causes of your condition. These may include:  Blood and urine tests.  Imaging studies of your brain, such as CT scan and MRI.  A test that measures involuntary muscle movement (electromyogram). How is this treated? Treatment for essential tremor depends on the severity of the condition.  Some  tremors may go away without treatment.  Mild tremors may not need treatment if they do not affect your day-to-day life.  Severe tremors may need to be treated using one or more of the following options: ? Medicines. ? Lifestyle changes. ? Occupational or physical therapy. Follow these instructions at home: Lifestyle   Do not use any products that contain nicotine or tobacco, such as cigarettes and e-cigarettes. If you need help quitting, ask your health care provider.  Limit your caffeine intake as told by your health care provider.  Try to get 8 hours of sleep each night.  Find ways to manage your stress that fits your lifestyle and personality. Consider trying meditation or yoga.  Try to anticipate stressful situations and allow extra time to manage them.  If you are struggling emotionally with the effects of your tremor, consider working with a mental health provider. General instructions  Take over-the-counter and prescription medicines only as told by your health care provider.  Avoid extreme heat and extreme cold.  Keep all follow-up visits as told by your health care provider. This is important. Visits may include physical therapy visits. Contact a health care provider if:  You experience any changes in the location or intensity of your tremors.  You start having a tremor after starting a new medicine.  You have tremor with other symptoms, such as: ? Numbness. ? Tingling. ? Pain. ? Weakness.  Your tremor gets worse.  Your tremor  interferes with your daily life.  You feel down, blue, or sad for at least 2 weeks in a row.  Worrying about your tremor and what other people think about you interferes with your everyday life functions, including relationships, work, or school. Summary  Essential tremor is a tremor without a known cause. Usually, it occurs when you are trying to perform an action.  The cause of this condition is not known.  The main sign of a  tremor is a rhythmic shaking of certain parts of your body that is uncontrolled and unintentional.  Treatment for essential tremor depends on the severity of the condition. This information is not intended to replace advice given to you by your health care provider. Make sure you discuss any questions you have with your health care provider. Document Released: 09/04/2014 Document Revised: 08/24/2017 Document Reviewed: 08/24/2017 Elsevier Patient Education  2020 [employer].

## 2019-05-07 NOTE — Assessment & Plan Note (Addendum)
Anticipate tremor noted over the past year is due to benign essential tremor. He does have fmhx tremor. Discussed treatment options, as not too bothersome for him, recommended against additional medication to treat this. He will continue to monitor. Tremor may be worsened by wellbutrin (although he states present prior to starting medication).

## 2019-05-07 NOTE — Assessment & Plan Note (Signed)
No significant weight loss noted despite wellbutrin and naltrexone. Discussed if no progress made over next 1-2 months will recommend stopping medications. Reviewed healthy diet choices - he will continue decreasing portion sizes and work on limiting sweetened beverages. Discussed importance of regular exercise routine - he is expecting new walker which will facilitate his ability to go to mall for daily walking routine. RTC 1 mo f/u visit.

## 2019-05-12 ENCOUNTER — Other Ambulatory Visit: Payer: Self-pay | Admitting: Family Medicine

## 2019-05-13 NOTE — Telephone Encounter (Signed)
E prescribed eszopiclone. plz clarify with patient - linzess was changed to amitiza by GI last month - he should now be on amitiza?

## 2019-05-13 NOTE — Telephone Encounter (Signed)
Spoke with Anthony [name].  He states he tried the Amitiza but it did not work as well as the [employer].  He would like to stay on the [place].  Ok to refill?

## 2019-05-13 NOTE — Telephone Encounter (Signed)
Last OV:  05/07/19, acute Next OV:  06/06/19, 1 mo f/u  Linzess was d/c by Dr. Bonna Gains (GI)

## 2019-05-14 ENCOUNTER — Ambulatory Visit: Payer: Medicare Other | Admitting: Family Medicine

## 2019-05-14 ENCOUNTER — Other Ambulatory Visit: Payer: Self-pay | Admitting: Family Medicine

## 2019-05-14 NOTE — Telephone Encounter (Signed)
linzess sent in. Will d/c amitiza

## 2019-06-06 ENCOUNTER — Encounter: Payer: Self-pay | Admitting: Family Medicine

## 2019-06-06 ENCOUNTER — Ambulatory Visit (INDEPENDENT_AMBULATORY_CARE_PROVIDER_SITE_OTHER): Payer: Medicare Other | Admitting: Family Medicine

## 2019-06-06 ENCOUNTER — Other Ambulatory Visit: Payer: Self-pay

## 2019-06-06 DIAGNOSIS — G250 Essential tremor: Secondary | ICD-10-CM

## 2019-06-06 DIAGNOSIS — I2510 Atherosclerotic heart disease of native coronary artery without angina pectoris: Secondary | ICD-10-CM

## 2019-06-06 NOTE — Patient Instructions (Addendum)
Stop naltrexone.  Continue wellbutrin SR 100mg  twice daily for the next month.  Make sure to take tamsulosin at bedtime.  Try to separate levothyroxine from vitamins by 1 hour (in the morning).  Return in 1 month for follow up visit.

## 2019-06-06 NOTE — Progress Notes (Signed)
This visit was conducted in person.  BP 140/86 (BP Location: Left Arm, Patient Position: Sitting, Cuff Size: Large)   Pulse 74   Temp 97.9 F (36.6 C)   Ht 6\' 1"  (1.854 m)   Wt 293 lb (132.9 kg)   SpO2 95%   BMI 38.66 kg/m    BP Readings from Last 3 Encounters:  06/06/19 140/86  05/07/19 120/80  04/16/19 (!) 97/57    CC: weight f/u visit Subjective:    Patient ID: Anthony [name], male    DOB: [birth year], 83 y.o.   MRN: [id number]  HPI: Anthony [name] is a 83 y.o. male presenting on 06/06/2019 for Weight Check (No issues with the medication)   See prior note for details.   Starting weight 290.5 lbs (02/17/2019)  Today's weight 293 lbs (06/06/2019)  On naltrexone and wellbutrin separately. He feels like meds help with decreased portion sizes, but no effect on weight noted.  Notes daytime somnolence as well as trouble falling asleep at night.   He has increased walking 1-2 mi/day regularly at the mall.  He has new walker which is helpful.      Relevant past medical, surgical, family and social history reviewed and updated as indicated. Interim medical history since our last visit reviewed. Allergies and medications reviewed and updated. Outpatient Medications Prior to Visit  Medication Sig Dispense Refill  . aspirin EC 81 MG tablet Take 1 tablet (81 mg total) by mouth daily.    . Azelastine HCl 0.15 % SOLN U 1 TO 2 SPRAYS IEN BID    . benzonatate (TESSALON) 100 MG capsule Take 1 capsule (100 mg total) by mouth 3 (three) times daily as needed for cough. 30 capsule 0  . buPROPion (WELLBUTRIN SR) 100 MG 12 hr tablet Take 1 tablet (100 mg total) by mouth 2 (two) times daily. 60 tablet 3  . Cholecalciferol (VITAMIN D3) 1000 units CAPS Take 1 capsule (1,000 Units total) by mouth daily. 30 capsule   . diphenhydramine-acetaminophen (TYLENOL PM EXTRA STRENGTH) 25-500 MG TABS tablet Take 1 tablet by mouth at bedtime as needed. (Patient taking differently: Take 1 tablet by mouth at  bedtime as needed (for sleep.). )    . docusate sodium (COLACE) 100 MG capsule Take 100 mg by mouth 2 (two) times daily.    . Eszopiclone 3 MG TABS TAKE 1 TABLET BY MOUTH AT BEDTIME AS NEEDED 30 tablet 0  . ezetimibe (ZETIA) 10 MG tablet TAKE 1 TABLET(10 MG) BY MOUTH DAILY 90 tablet 3  . fenofibrate 160 MG tablet TAKE 1 TABLET BY MOUTH EVERY DAY 90 tablet 3  . finasteride (PROSCAR) 5 MG tablet TAKE 1 TABLET(5 MG) BY MOUTH DAILY 90 tablet 1  . furosemide (LASIX) 40 MG tablet TAKE 1 TABLET BY MOUTH EVERY DAY. TAKE AN EXTRA TABLET AS NEEDED FOR WEIGHT GAIN OF 3 LBS/ DAY OR 5 LBS/ WEEK 135 tablet 3  . ketoconazole (NIZORAL) 2 % cream Apply 1 application topically daily as needed (for dermatitits).     Marland Kitchen ketoconazole (NIZORAL) 2 % shampoo APP EXT TO THE SKIN 3 TIMES WEEKLY    . levothyroxine (SYNTHROID) 175 MCG tablet Take 1 tablet (175 mcg total) by mouth daily before breakfast. 90 tablet 1  . LINZESS 290 MCG CAPS capsule TAKE 1 CAPSULE(290 MCG) BY MOUTH DAILY 30 MINUTES BEFORE FIRST MEAL OF THE DAY ON AN EMPTY STOMACH 90 capsule 1  . lisinopril (PRINIVIL,ZESTRIL) 10 MG tablet Take 1 tablet (10 mg total)  by mouth daily. 90 tablet 3  . loratadine (CLARITIN) 10 MG tablet Take 1 tablet (10 mg total) by mouth daily. (Patient taking differently: Take 10 mg by mouth daily as needed. )    . Multiple Vitamins-Minerals (CENTRUM SILVER PO) Take 1 tablet by mouth daily.    . Multiple Vitamins-Minerals (PRESERVISION AREDS 2 PO) Take 1 tablet by mouth daily.    . nitroGLYCERIN (NITROSTAT) 0.4 MG SL tablet Place 1 tablet (0.4 mg total) under the tongue every 5 (five) minutes as needed for chest pain. 25 tablet 0  . omega-3 acid ethyl esters (LOVAZA) 1 g capsule Take 2 capsules (2 g total) by mouth 2 (two) times daily. 360 capsule 3  . potassium chloride (K-DUR,KLOR-CON) 10 MEQ tablet TAKE 1 TABLET(10 MEQ) BY MOUTH TWICE DAILY 180 tablet 1  . Psyllium (METAMUCIL FIBER) 51.7 % PACK Take 5-10 mLs by mouth daily.    .  tamsulosin (FLOMAX) 0.4 MG CAPS capsule TAKE 2 CAPSULES(0.8 MG) BY MOUTH DAILY 180 capsule 1  . naltrexone (DEPADE) 50 MG tablet Take 0.5 tablets (25 mg total) by mouth 2 (two) times daily. 90 tablet 0   No facility-administered medications prior to visit.      Per HPI unless specifically indicated in ROS section below Review of Systems Objective:    BP 140/86 (BP Location: Left Arm, Patient Position: Sitting, Cuff Size: Large)   Pulse 74   Temp 97.9 F (36.6 C)   Ht 6\' 1"  (1.854 m)   Wt 293 lb (132.9 kg)   SpO2 95%   BMI 38.66 kg/m   Wt Readings from Last 3 Encounters:  06/06/19 293 lb (132.9 kg)  05/07/19 293 lb 1 oz (132.9 kg)  04/16/19 293 lb 12.8 oz (133.3 kg)    Physical Exam Vitals signs and nursing note reviewed.  Constitutional:      General: He is not in acute distress.    Appearance: Normal appearance. He is not ill-appearing.  HENT:     Head: Normocephalic and atraumatic.     Mouth/Throat:     Mouth: Mucous membranes are moist.     Pharynx: Oropharynx is clear. No posterior oropharyngeal erythema.  Cardiovascular:     Rate and Rhythm: Normal rate and regular rhythm.     Pulses: Normal pulses.     Heart sounds: Normal heart sounds. No murmur.  Pulmonary:     Effort: Pulmonary effort is normal. No respiratory distress.     Breath sounds: Normal breath sounds. No wheezing, rhonchi or rales.  Musculoskeletal:     Right lower leg: No edema.     Left lower leg: No edema.  Neurological:     Mental Status: He is alert.  Psychiatric:        Mood and Affect: Mood normal.        Behavior: Behavior normal.       Results for orders placed or performed in visit on 123456  Basic metabolic panel  Result Value Ref Range   Sodium 137 135 - 145 mEq/L   Potassium 4.0 3.5 - 5.1 mEq/L   Chloride 103 96 - 112 mEq/L   CO2 29 19 - 32 mEq/L   Glucose, Bld 116 (H) 70 - 99 mg/dL   BUN 28 (H) 6 - 23 mg/dL   Creatinine, Ser 1.22 0.40 - 1.50 mg/dL   Calcium 10.7 (H) 8.4 -  10.5 mg/dL   GFR 56.98 (L) >60.00 mL/min  T4, free  Result Value Ref Range  Free T4 0.87 0.60 - 1.60 ng/dL  TSH  Result Value Ref Range   TSH 0.72 0.35 - 4.50 uIU/mL   Assessment & Plan:   Problem List Items Addressed This Visit    Severe obesity (BMI 35.0-39.9) with comorbidity ([place])    Weight largely unchanged despite several months of wellbutrin/naltrexone (prescribed in individual components because contrave was unaffordable).  Will stop naltrexone, continue wellbutrin for now.  Pt motivated to continue healthy diet and lifestyle.  RTC 1 mo f/u visit - if unchanged weight, consider discontinuing wellbutrin as well.  He is not interested in [facility] healthy weight and wellness referral as he doesn't want to travel to [place].       Essential tremor    Ongoing, presumed essential. Monitor for worsening on wellbutrin.           No orders of the defined types were placed in this encounter.  No orders of the defined types were placed in this encounter.  Patient Instructions  Stop naltrexone.  Continue wellbutrin SR 100mg  twice daily for the next month.  Make sure to take tamsulosin at bedtime.  Try to separate levothyroxine from vitamins by 1 hour (in the morning).  Return in 1 month for follow up visit.   Follow up plan: Return in about 4 weeks (around 07/04/2019) for follow up visit.  Ria Bush, MD

## 2019-06-09 NOTE — Assessment & Plan Note (Signed)
Ongoing, presumed essential. Monitor for worsening on wellbutrin.

## 2019-06-09 NOTE — Assessment & Plan Note (Signed)
Weight largely unchanged despite several months of wellbutrin/naltrexone (prescribed in individual components because contrave was unaffordable).  Will stop naltrexone, continue wellbutrin for now.  Pt motivated to continue healthy diet and lifestyle.  RTC 1 mo f/u visit - if unchanged weight, consider discontinuing wellbutrin as well.  He is not interested in [facility] healthy weight and wellness referral as he doesn't want to travel to [place].

## 2019-06-14 ENCOUNTER — Other Ambulatory Visit: Payer: Self-pay | Admitting: Family Medicine

## 2019-06-19 ENCOUNTER — Other Ambulatory Visit: Payer: Self-pay

## 2019-06-19 ENCOUNTER — Ambulatory Visit (INDEPENDENT_AMBULATORY_CARE_PROVIDER_SITE_OTHER): Payer: Medicare Other | Admitting: Internal Medicine

## 2019-06-19 ENCOUNTER — Encounter: Payer: Self-pay | Admitting: Internal Medicine

## 2019-06-19 VITALS — BP 124/84 | HR 87 | Ht 73.0 in | Wt 293.5 lb

## 2019-06-19 DIAGNOSIS — E782 Mixed hyperlipidemia: Secondary | ICD-10-CM | POA: Diagnosis not present

## 2019-06-19 DIAGNOSIS — I2510 Atherosclerotic heart disease of native coronary artery without angina pectoris: Secondary | ICD-10-CM | POA: Diagnosis not present

## 2019-06-19 DIAGNOSIS — I10 Essential (primary) hypertension: Secondary | ICD-10-CM

## 2019-06-19 DIAGNOSIS — I483 Typical atrial flutter: Secondary | ICD-10-CM

## 2019-06-19 NOTE — Progress Notes (Addendum)
Patient Care Team: Ria Bush, MD as PCP - General (Family Medicine) Gillis Santa, MD as Consulting Physician (Pain Medicine) Deboraha Sprang, MD as Consulting Physician (Cardiology)   HPI  Anthony [name] is a 83 y.o. male is the  father of Anthony [name] with a history of atrial flutter for which he underwent catheter ablation 9/18.   He has a history of coronary artery disease with prior stenting of his LAD and his RCA in 2013.    Echocardiogram 6/18 demonstrated normal LV function and normal left atrial size  Thromboembolic risk factors ( age -55, HTN-1 , Vasc disease -1) for a CHADSVASc Score of 4   At last visit he has lost about 40 pounds.  Unfortunately, this weight is all gone back on.  He struggles with walking because of back pain.  He has a stimulator which is helped a bunch but he recently found a new stand-up rolling walker with which she can walk 3 miles without pain.  Had episode of chest pain a few weeks ago he awakened it with it.  It would come and go for 5-30 minutes.  It was not aggravated by exercise.  He has had none since.  Chronic mild dyspnea with exertion.  Mild edema.  No syncope.  No palpitations.   Date Cr K Hgb  9/18   14.4  2/19 1.45 4.3    9/19  1.25 3.8 14.0  4/20 1.22 4.0    DATE TEST EF   6/18 Echo   55-65 %   4/19 MYOVIEW  57 % Non Ischemic           Past Medical History:  Diagnosis Date  . Anosmia 1980s  . Atrial flutter ([place]) 2018   had an ablation with dr. Erick Alley  . CAD (coronary artery disease)   . Cancer ([place])    skin cancer  . Chronic constipation   . Chronic insomnia   . Chronic lower back pain    s/p spine stimulator placement  . Complication of anesthesia    when under general, he woke up agitated and unable to be held down  . Diastolic CHF ([place])   . Dyspnea    had prior to having cardiac stent placed  . History of diverticulitis 2017  . History of pneumonia 2013  . Hyperlipidemia   . Hypertension    . Hypothyroidism   . Obesity, Class II, BMI 35-39.9, with comorbidity   . OSA on CPAP     Past Surgical History:  Procedure Laterality Date  . A-FLUTTER ABLATION N/A 05/07/2017   Procedure: A-Flutter Ablation;  Surgeon: Deboraha Sprang, MD;  Location: [facility];  Service: Cardiovascular;  Laterality: N/A;  . CARDIOVASCULAR STRESS TEST  11/2017   no ischemia, low risk study  . COLONOSCOPY  2007   normal per prior PCP records, rpt 10 yrs (Dr Osie Cheeks)  . ELBOW SURGERY Right    ulnar nerve decompression.  PT DOES NOT RECALL THIS PROCEDURE  . LAMINECTOMY THORACIC SPINE W/ PLACEMENT SPINAL CORD STIMULATOR  08/2018   and removal of prior spine stimulator (Dr Lacinda Axon)  . NASAL SINUS SURGERY     nasal polyps. done a long time ago  . PERCUTANEOUS CORONARY STENT INTERVENTION (PCI-S)  2013   EF55%, 70% mid LAD, 99% mid RCA, mild MR, elev LVEDP, DES to mid LAD. RCA is nondominant  . RADIOFREQUENCY ABLATION  06/2017   lumbar region [facility])   .  REPLACEMENT TOTAL KNEE BILATERAL Bilateral 2000s  . SPINAL CORD STIMULATOR IMPLANT  06/2016  . SPINAL CORD STIMULATOR INSERTION N/A 09/09/2018   Procedure: LUMBAR SPINAL CORD STIMULATOR LEAD AND BATTERY REMOVAL AND LAMINECTOMY FOR PLACEMENT OF PADDLE;  Surgeon: Deetta Perla, MD;  Location: [facility];  Service: Neurosurgery;  Laterality: N/A;  . SPINAL CORD STIMULATOR INSERTION N/A 09/16/2018   Procedure: PLACEMENT OF SPINAL CORD STIMULATOR BATTERY;  Surgeon: Deetta Perla, MD;  Location: [facility];  Service: Neurosurgery;  Laterality: N/A;    Current Meds  Medication Sig  . aspirin EC 81 MG tablet Take 1 tablet (81 mg total) by mouth daily.  . Azelastine HCl 0.15 % SOLN U 1 TO 2 SPRAYS IEN BID  . benzonatate (TESSALON) 100 MG capsule Take 1 capsule (100 mg total) by mouth 3 (three) times daily as needed for cough.  Marland Kitchen buPROPion (WELLBUTRIN SR) 100 MG 12 hr tablet Take 1 tablet (100 mg total) by mouth 2 (two) times daily.  . Cholecalciferol (VITAMIN D3)  1000 units CAPS Take 1 capsule (1,000 Units total) by mouth daily.  . diphenhydramine-acetaminophen (TYLENOL PM EXTRA STRENGTH) 25-500 MG TABS tablet Take 1 tablet by mouth at bedtime as needed. (Patient taking differently: Take 1 tablet by mouth at bedtime as needed (for sleep.). )  . docusate sodium (COLACE) 100 MG capsule Take 100 mg by mouth 2 (two) times daily.  . Eszopiclone 3 MG TABS TAKE 1 TABLET BY MOUTH AT BEDTIME AS NEEDED  . ezetimibe (ZETIA) 10 MG tablet TAKE 1 TABLET(10 MG) BY MOUTH DAILY  . fenofibrate 160 MG tablet TAKE 1 TABLET BY MOUTH EVERY DAY  . finasteride (PROSCAR) 5 MG tablet TAKE 1 TABLET(5 MG) BY MOUTH DAILY  . furosemide (LASIX) 40 MG tablet TAKE 1 TABLET BY MOUTH EVERY DAY. TAKE AN EXTRA TABLET AS NEEDED FOR WEIGHT GAIN OF 3 LBS/ DAY OR 5 LBS/ WEEK  . ketoconazole (NIZORAL) 2 % cream Apply 1 application topically daily as needed (for dermatitits).   Marland Kitchen ketoconazole (NIZORAL) 2 % shampoo APP EXT TO THE SKIN 3 TIMES WEEKLY  . levothyroxine (SYNTHROID) 175 MCG tablet Take 1 tablet (175 mcg total) by mouth daily before breakfast.  . LINZESS 290 MCG CAPS capsule TAKE 1 CAPSULE(290 MCG) BY MOUTH DAILY 30 MINUTES BEFORE FIRST MEAL OF THE DAY ON AN EMPTY STOMACH  . lisinopril (PRINIVIL,ZESTRIL) 10 MG tablet Take 1 tablet (10 mg total) by mouth daily.  Marland Kitchen loratadine (CLARITIN) 10 MG tablet Take 1 tablet (10 mg total) by mouth daily. (Patient taking differently: Take 10 mg by mouth daily as needed. )  . Multiple Vitamins-Minerals (CENTRUM SILVER PO) Take 1 tablet by mouth daily.  . Multiple Vitamins-Minerals (PRESERVISION AREDS 2 PO) Take 1 tablet by mouth daily.  . nitroGLYCERIN (NITROSTAT) 0.4 MG SL tablet Place 1 tablet (0.4 mg total) under the tongue every 5 (five) minutes as needed for chest pain.  Marland Kitchen omega-3 acid ethyl esters (LOVAZA) 1 g capsule Take 2 capsules (2 g total) by mouth 2 (two) times daily.  . potassium chloride (KLOR-CON) 10 MEQ tablet TAKE 1 TABLET(10 MEQ) BY  MOUTH TWICE DAILY  . Psyllium (METAMUCIL FIBER) 51.7 % PACK Take 5-10 mLs by mouth daily.  . tamsulosin (FLOMAX) 0.4 MG CAPS capsule TAKE 2 CAPSULES(0.8 MG) BY MOUTH DAILY    Allergies  Allergen Reactions  . Questran [Cholestyramine]     Patient not aware of an allergy to this medicine.  . Atorvastatin     Muscle pain  .  Gemfibrozil Other (See Comments)    Muscle pain.  . Metformin And Related     Dizziness  . Rosuvastatin     Muscle pain      Review of Systems negative except from HPI and PMH  Physical Exam BP 124/84 (BP Location: Left Arm, Patient Position: Sitting, Cuff Size: Large)   Pulse 87   Ht 6\' 1"  (1.854 m)   Wt 293 lb 8 oz (133.1 kg)   SpO2 98%   BMI 38.72 kg/m  Well developed and nourished in no acute distress HENT normal Neck supple withouit JVP carotids without bruits Clear Regular rate and rhythm, no murmurs or gallops Abd-soft with active BS No Clubbing cyanosis edema Skin-warm and dry A & Oriented  Grossly normal sensory and motor function  ECG sinus at 85 Intervals 18/11/36 Axis left -67    Assessment and Plan:  Atrial flutter-typical  S/p ablation  HFpEF  Statin intolerance  Hyperlipidemia  Coronary artery disease with prior stenting  Morbid obesity   Without symptoms of ischemia--chest pain is atypical  Euvolemic continue current meds  Encouraged him in his weight loss efforts  Given his coronary disease and his hyperlipidemia and his statin intolerance, I recommended that he consider PCSK9 therapy.  He can discuss it with his son and we have arranged for a consultation with the lipid clinic.  We spent more than 50% of our >25 min visit in face to face counseling regarding the above    Virl Axe

## 2019-06-19 NOTE — Patient Instructions (Signed)
Medication Instructions:  - Your physician recommends that you continue on your current medications as directed. Please refer to the Current Medication list given to you today.  *If you need a refill on your cardiac medications before your next appointment, please call your pharmacy*  Lab Work: - none ordered If you have labs (blood work) drawn today and your tests are completely normal, you will receive your results only by: Marland Kitchen MyChart Message (if you have MyChart) OR . A paper copy in the mail If you have any lab test that is abnormal or we need to change your treatment, we will call you to review the results.  Testing/Procedures: - You have been referred to: the Lipid Clinic in our [place] office to discuss further management of you cholesterol.    Follow-Up: At [facility], you and your health needs are our priority.  As part of our continuing mission to provide you with exceptional heart care, we have created designated Provider Care Teams.  These Care Teams include your primary Cardiologist (physician) and Advanced Practice Providers (APPs -  Physician Assistants and Nurse Practitioners) who all work together to provide you with the care you need, when you need it.  Your next appointment:   12 months  The format for your next appointment:   In Person  Provider:   Virl Axe, MD  Other Instructions - N/A

## 2019-06-20 ENCOUNTER — Other Ambulatory Visit: Payer: Self-pay | Admitting: Family Medicine

## 2019-06-20 NOTE — Telephone Encounter (Signed)
Patient is requesting a refill  Hydrocodone   Patient is completely out and only takes these occasionally  [place]

## 2019-06-20 NOTE — Telephone Encounter (Signed)
Name of Medication:Hydrocodone apap 5-325 mg  Name of Pharmacy: [facility] [address] Last Fill or Written Date and Quantity:# 63 on 02/11/18  Last Office Visit and Type: 06/06/19 Next Office Visit and Type: 07/07/19 for 1 mth FU Last Controlled Substance Agreement Date: none Last [id number].  Pt only uses Hydrocodone apap just uses occasionally for chronic back pain and pt is completely out.

## 2019-06-21 MED ORDER — HYDROCODONE-ACETAMINOPHEN 5-325 MG PO TABS: 1.0000 | ORAL_TABLET | Freq: Three times a day (TID) | ORAL | 0 refills | Status: DC | PRN

## 2019-06-21 NOTE — Telephone Encounter (Signed)
Prior filled by pain management.  I have filled #[id number]. If ongoing use needed, will need to schedule OV to establish for chronic pain.  [place] CSRS reviewed

## 2019-06-23 NOTE — Progress Notes (Signed)
Patient ID: KALEAB BERLEY                 DOB: [birth year]                    MRN: [id number]     HPI: PHILLIPS GRIM is a 83 y.o. male patient referred to lipid clinic by Dr. Caryl Comes. PMH is significant for hx of Atrial flutter (underwent catheter ablation 9/18), hx of CAD (prior stenting of his LAD and his RCA in 0000000), diastolic HF, HTN, hx of abdominal aortic aneursym (2017), OSA, diverticulitis, hypothyroidism, essential tremor, hx of basal cell carcinoma (2017), osteoarthiritis, BPH, CKD, vitamin D deficiency, depression, and seasonal allergies.  Pt presents today with his daughter-in-law Lawerance Bach); permission given. Pt confirms that he has had an intolerance to satins, cholestyramine, and gemfibrozil. He states he did not remember the exact dosing of statin that he has tried, however, states that he remembers it was the lowest dose. Pt's daughter-in-law states she is a Marine scientist and her husband is a Engineer, drilling. The patient, daughter-in-law, and son would prefer that patient is on a cholesterol medication that has ASCVD benefit.   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  Current Medications: Zetia 10 mg daily, fenofibrate 160 mg daily, Lovaza 2 grams BID Intolerances: rosuvastatin (myalgias), atorvastatin (myalgias), cholestyramine (myalgias), gemfibrozil (myalgias)  Risk Factors: strong cardiac history (CAD s/p stenting; HTN, diastolic HF), age, obesity  LDL goal: <70 mg/dL  Diet: 2 meals per day (lunch, dinnner) Foods: hamburgers, ham sandwich, salads -Has tried a variety of diets (vegetarian)  Exercise: walks around mall frequently -Very involved with grandchildren and their sports  Family History: mother (stroke); father (kidney disease)  Social History: no alcohol or tobacco use  Labs: 10/24/18: TC 196 TG 213 HDL 47.60 LDL (direct) 117 NonHDL 147.93 VLDL 42.6; fenofibrate 160 mg daily, Zetia 10 mg daily, Lovaza 2 grams BID 05/21/18: TC 186 TG 262 HDL 42.50  LDL (direct) 96.0 NonHDL 143.34 VLDL 52.4; fenofibrate 160 mg daily, Zetia 10 mg daily, Lovaza 2 grams BID 10/11/17: TC 199 TG 253 HDL 41.3 LDL (direct) 108 NonHDL 157.82 VLDL 50.6; fenofibrate 160 mg daily, Zetia 10 mg daily, Lovaza 2 grams BID  Past Medical History:  Diagnosis Date  . Anosmia 1980s  . Atrial flutter ([place]) 2018   had an ablation with dr. Erick Alley  . CAD (coronary artery disease)   . Cancer ([place])    skin cancer  . Chronic constipation   . Chronic insomnia   . Chronic lower back pain    s/p spine stimulator placement  . Complication of anesthesia    when under general, he woke up agitated and unable to be held down  . Diastolic CHF ([place])   . Dyspnea    had prior to having cardiac stent placed  . History of diverticulitis 2017  . History of pneumonia 2013  . Hyperlipidemia   . Hypertension   . Hypothyroidism   . Obesity, Class II, BMI 35-39.9, with comorbidity   . OSA on CPAP     Current Outpatient Medications on File Prior to Visit  Medication Sig Dispense Refill  . aspirin EC 81 MG tablet Take 1 tablet (81 mg total) by mouth daily.    . Azelastine HCl 0.15 % SOLN U 1 TO 2 SPRAYS IEN BID    . benzonatate (TESSALON) 100 MG capsule Take 1 capsule (100 mg total) by mouth 3 (three) times daily as needed for cough.  30 capsule 0  . buPROPion (WELLBUTRIN SR) 100 MG 12 hr tablet Take 1 tablet (100 mg total) by mouth 2 (two) times daily. 60 tablet 3  . Cholecalciferol (VITAMIN D3) 1000 units CAPS Take 1 capsule (1,000 Units total) by mouth daily. 30 capsule   . diphenhydramine-acetaminophen (TYLENOL PM EXTRA STRENGTH) 25-500 MG TABS tablet Take 1 tablet by mouth at bedtime as needed. (Patient taking differently: Take 1 tablet by mouth at bedtime as needed (for sleep.). )    . docusate sodium (COLACE) 100 MG capsule Take 100 mg by mouth 2 (two) times daily.    . Eszopiclone 3 MG TABS TAKE 1 TABLET BY MOUTH AT BEDTIME AS NEEDED 30 tablet 0  . ezetimibe (ZETIA) 10 MG tablet  TAKE 1 TABLET(10 MG) BY MOUTH DAILY 90 tablet 3  . fenofibrate 160 MG tablet TAKE 1 TABLET BY MOUTH EVERY DAY 90 tablet 3  . finasteride (PROSCAR) 5 MG tablet TAKE 1 TABLET(5 MG) BY MOUTH DAILY 90 tablet 1  . furosemide (LASIX) 40 MG tablet TAKE 1 TABLET BY MOUTH EVERY DAY. TAKE AN EXTRA TABLET AS NEEDED FOR WEIGHT GAIN OF 3 LBS/ DAY OR 5 LBS/ WEEK 135 tablet 3  . HYDROcodone-acetaminophen (NORCO/VICODIN) 5-325 MG tablet Take 1 tablet by mouth 3 (three) times daily as needed for moderate pain. 15 tablet 0  . ketoconazole (NIZORAL) 2 % cream Apply 1 application topically daily as needed (for dermatitits).     Marland Kitchen ketoconazole (NIZORAL) 2 % shampoo APP EXT TO THE SKIN 3 TIMES WEEKLY    . levothyroxine (SYNTHROID) 175 MCG tablet Take 1 tablet (175 mcg total) by mouth daily before breakfast. 90 tablet 1  . LINZESS 290 MCG CAPS capsule TAKE 1 CAPSULE(290 MCG) BY MOUTH DAILY 30 MINUTES BEFORE FIRST MEAL OF THE DAY ON AN EMPTY STOMACH 90 capsule 1  . lisinopril (PRINIVIL,ZESTRIL) 10 MG tablet Take 1 tablet (10 mg total) by mouth daily. 90 tablet 3  . loratadine (CLARITIN) 10 MG tablet Take 1 tablet (10 mg total) by mouth daily. (Patient taking differently: Take 10 mg by mouth daily as needed. )    . Multiple Vitamins-Minerals (CENTRUM SILVER PO) Take 1 tablet by mouth daily.    . Multiple Vitamins-Minerals (PRESERVISION AREDS 2 PO) Take 1 tablet by mouth daily.    . nitroGLYCERIN (NITROSTAT) 0.4 MG SL tablet Place 1 tablet (0.4 mg total) under the tongue every 5 (five) minutes as needed for chest pain. 25 tablet 0  . omega-3 acid ethyl esters (LOVAZA) 1 g capsule Take 2 capsules (2 g total) by mouth 2 (two) times daily. 360 capsule 3  . potassium chloride (KLOR-CON) 10 MEQ tablet TAKE 1 TABLET(10 MEQ) BY MOUTH TWICE DAILY 180 tablet 1  . Psyllium (METAMUCIL FIBER) 51.7 % PACK Take 5-10 mLs by mouth daily.    . tamsulosin (FLOMAX) 0.4 MG CAPS capsule TAKE 2 CAPSULES(0.8 MG) BY MOUTH DAILY 180 capsule 1   No  current facility-administered medications on file prior to visit.     Allergies  Allergen Reactions  . Questran [Cholestyramine]     Patient not aware of an allergy to this medicine.  . Atorvastatin     Muscle pain  . Gemfibrozil Other (See Comments)    Muscle pain.  . Metformin And Related     Dizziness  . Rosuvastatin     Muscle pain    Assessment/Plan:  1. Hyperlipidemia - goal LDL < 70 mg/dL; therefore, pt is not at goal. Pt's  insurance covers Praluent, specifically will provide a 25% discount on Tier 5 medications (Praluent is considered a Tier 5 medication) for a 30-day supply or 90-day supply. Informed pt that Praluent prescription will likely be >$100; therefore, will plan to pursue co-pay assistance via [employer]. Daughter-in-law would like to be involved and help with process if necessary. She states her phone number is [phone]. Initiate Praluent 75 mg subQ every 14 days. Educated pt on efficacy, mechanism of action, side effects, and monitoring parameters with Praluent; pt verbalized understanding. Offered to patient the option to inject first dose in lipid clinic under supervision of pharmacist; however, pt politely declined. Continue Zetia 10 mg daily, fenofibrate 160 mg daily, and Lovaza 2 grams BID. Will f/u with pt once prior authorization is approved regarding information required to start [employer].  Thank you for involving pharmacy to assist in providing Mr. Zapien's care.   Drexel Iha, PharmD PGY2 Ambulatory Care Pharmacy Resident

## 2019-06-24 ENCOUNTER — Ambulatory Visit (INDEPENDENT_AMBULATORY_CARE_PROVIDER_SITE_OTHER): Payer: Medicare Other | Admitting: Pharmacist

## 2019-06-24 ENCOUNTER — Other Ambulatory Visit: Payer: Self-pay

## 2019-06-24 DIAGNOSIS — I2510 Atherosclerotic heart disease of native coronary artery without angina pectoris: Secondary | ICD-10-CM

## 2019-06-24 DIAGNOSIS — E782 Mixed hyperlipidemia: Secondary | ICD-10-CM | POA: Diagnosis not present

## 2019-06-24 NOTE — Patient Instructions (Addendum)
It was a pleasure seeing you in clinic today Mr. Kainz!  Today the plan is... 1. We will start prior authorization for PCSK9 inhibitor (Praluent 75 mg subQ every 14 days) 2. Once prior authorization is approved we will contact you about [place] to follow up on annual income information. 3. We will get information from [facility] and provide to your local pharmacy. We will then call your pharmacy and make sure copay is $0.  4. We will call you and let you know when you are able to pick up medication from pharmacy.  Please call the PharmD clinic at [phone] if you have any questions that you would like to speak with a pharmacist about [facility], [place], [place]).

## 2019-06-26 NOTE — Telephone Encounter (Signed)
Schedule pt for 07/04/19 @ 3:30. Pt is already scheduled for 07/07/19 for 1 month follow up but I will let Dr. Darnell Level determine whether he needs to keep this appointment.

## 2019-06-30 ENCOUNTER — Telehealth: Payer: Self-pay | Admitting: Pharmacist

## 2019-06-30 MED ORDER — PRALUENT 75 MG/ML ~~LOC~~ SOAJ: 1.0000 "pen " | SUBCUTANEOUS | 11 refills | Status: DC

## 2019-06-30 NOTE — Telephone Encounter (Addendum)
Praluent appeals overturned and is now  approved through 12/25/2019. Rx sent to [facility] Cost is $275 for a 30 day supply. Looks like patient may have not met deducible yet. Will apply for [employer] to help with the cost.

## 2019-07-02 ENCOUNTER — Telehealth: Payer: Self-pay | Admitting: Pharmacist

## 2019-07-02 DIAGNOSIS — E782 Mixed hyperlipidemia: Secondary | ICD-10-CM

## 2019-07-02 MED ORDER — VASCEPA 1 G PO CAPS: 2.0000 | ORAL_CAPSULE | Freq: Two times a day (BID) | ORAL | 11 refills | Status: DC

## 2019-07-02 NOTE — Addendum Note (Signed)
Addended by: Ellwood Handler on: 07/02/2019 01:03 PM   Modules accepted: Orders

## 2019-07-02 NOTE — Telephone Encounter (Signed)
Called pt on 07/02/2019 at 10:20 AM.  [facility] pharmacy located at [address] [place], [place] [address] and spoke with pharmacy staff member. Provided [employer].   [place] for Pharmacy BIN [id number] PCN [id number] Group [id number] ID [id number]  Pharmacy staff member confirmed that Praluent and Vascepa prescriptions went through for a $0 copay when billed appropriately with Medicare Insurance and [employer] information. Pharmacy staff member stated he would receive medications in order today around 2-3PM.  Called pt to update him on status of Praluent and Vascepa and instructed him to go to pharmacy today (after 3PM) or tomorrrow to pick up prescriptions for $0 copay. Patient verbalized understanding.  Patient was not at home when called and asked if I could call back after 11AM to schedule lipid panel/LFT lab draw.   Plan to call patient back today after 11AM to schedule labs.

## 2019-07-02 NOTE — Telephone Encounter (Signed)
Called pt back on 07/02/2019 at 1:02 PM to schedule labs.  Lipid panel/LFTs scheduled for 10/06/2019 at 11:15 AM. Informed pt these are fasting labs. Pt verbalized understanding.  Will f/u with pt on 10/07/2019 with lab results.   Drexel Iha, PharmD PGY2 Ambulatory Care Pharmacy Resident

## 2019-07-04 ENCOUNTER — Encounter: Payer: Self-pay | Admitting: Family Medicine

## 2019-07-04 ENCOUNTER — Ambulatory Visit (INDEPENDENT_AMBULATORY_CARE_PROVIDER_SITE_OTHER): Payer: Medicare Other | Admitting: Family Medicine

## 2019-07-04 ENCOUNTER — Other Ambulatory Visit: Payer: Self-pay

## 2019-07-04 VITALS — BP 116/68 | HR 99 | Temp 97.6°F | Ht 70.0 in | Wt 290.4 lb

## 2019-07-04 DIAGNOSIS — Z9689 Presence of other specified functional implants: Secondary | ICD-10-CM

## 2019-07-04 DIAGNOSIS — K5903 Drug induced constipation: Secondary | ICD-10-CM

## 2019-07-04 DIAGNOSIS — T402X5A Adverse effect of other opioids, initial encounter: Secondary | ICD-10-CM

## 2019-07-04 DIAGNOSIS — F3341 Major depressive disorder, recurrent, in partial remission: Secondary | ICD-10-CM

## 2019-07-04 DIAGNOSIS — G8929 Other chronic pain: Secondary | ICD-10-CM | POA: Diagnosis not present

## 2019-07-04 DIAGNOSIS — I2510 Atherosclerotic heart disease of native coronary artery without angina pectoris: Secondary | ICD-10-CM

## 2019-07-04 DIAGNOSIS — M545 Low back pain: Secondary | ICD-10-CM | POA: Diagnosis not present

## 2019-07-04 MED ORDER — HYDROCODONE-ACETAMINOPHEN 5-325 MG PO TABS: 1.0000 | ORAL_TABLET | Freq: Two times a day (BID) | ORAL | 0 refills | Status: DC | PRN

## 2019-07-04 NOTE — Patient Instructions (Addendum)
Will prescribe hydrocodone /acetaminophen 5/325mg  to use sparingly Fill out controlled substance agreement and UDS today.  Continue wellbutrin for now.  Good to see you today.  Return as needed or in 3 months for physical.

## 2019-07-04 NOTE — Progress Notes (Signed)
This visit was conducted in person.  BP 116/68 (BP Location: Right Arm, Patient Position: Sitting, Cuff Size: Large)   Pulse 99   Temp 97.6 F (36.4 C) (Temporal)   Ht 5\' 10"  (1.778 m)   Wt 290 lb 7 oz (131.7 kg)   SpO2 95%   BMI 41.67 kg/m   BP Readings from Last 3 Encounters:  07/04/19 116/68  06/19/19 124/84  06/06/19 140/86    CC: weight f/u, chronic pain visit Subjective:    Patient ID: Anthony [name], male    DOB: [birth year], 83 y.o.   MRN: [id number]  HPI: Anthony [name] is a 83 y.o. male presenting on 07/04/2019 for Pain Management (Here for f/u.)   To get Vascepa and Praluent for $0 copay through [facility]! Approved through 11/2019 with assistance of pharmacy clinic. He continues zetia and fenofibrate as is unable to tolerate statins. Planned rpt labs in 3 months.   Chronic pain limits ambulation although he is doing better with new stand-up rolling walker. Has been walking 2 miles daily without pain. S/p lumbar nerve ablation and spine stimulator placement. See recent refill request (06/20/2019) - he requested hydrocodone Rx but never picked this up - previously prescribed by pain management (last filled 01/2018). He wants to have on hand to use very sparingly.    Obesity - weight gain over the last several months, with 3 lb weight loss in last 4 wks. Was on individual naltrexone and wellbutrin (combo contrave prohibitively expensive) - without significant improvement. Previously did not respond to [place]. He is not interested in [facility] healthy weight and wellness center.   Starting weight 290.5 lbs (02/17/2019)  Today's weight: 290 lbs (07/04/2019)  Walking 2 mi/day.  Overall healthy diet choices. He does tend to skip breakfast but has lunch and 1/2 sandwich for dinner. Drinking pop, wants to switch to water. Desires to continue wellbutrin.      Relevant past medical, surgical, family and social history reviewed and updated as indicated. Interim medical history  since our last visit reviewed. Allergies and medications reviewed and updated. Outpatient Medications Prior to Visit  Medication Sig Dispense Refill  . Alirocumab (PRALUENT) 75 MG/ML SOAJ Inject 1 pen into the skin every 14 (fourteen) days. 2 pen 11  . aspirin EC 81 MG tablet Take 1 tablet (81 mg total) by mouth daily.    . Azelastine HCl 0.15 % SOLN U 1 TO 2 SPRAYS IEN BID    . benzonatate (TESSALON) 100 MG capsule Take 1 capsule (100 mg total) by mouth 3 (three) times daily as needed for cough. 30 capsule 0  . buPROPion (WELLBUTRIN SR) 100 MG 12 hr tablet Take 1 tablet (100 mg total) by mouth 2 (two) times daily. 60 tablet 3  . Cholecalciferol (VITAMIN D3) 1000 units CAPS Take 1 capsule (1,000 Units total) by mouth daily. 30 capsule   . diphenhydramine-acetaminophen (TYLENOL PM EXTRA STRENGTH) 25-500 MG TABS tablet Take 1 tablet by mouth at bedtime as needed. (Patient taking differently: Take 1 tablet by mouth at bedtime as needed (for sleep.). )    . docusate sodium (COLACE) 100 MG capsule Take 100 mg by mouth 2 (two) times daily.    . Eszopiclone 3 MG TABS TAKE 1 TABLET BY MOUTH AT BEDTIME AS NEEDED 30 tablet 0  . ezetimibe (ZETIA) 10 MG tablet TAKE 1 TABLET(10 MG) BY MOUTH DAILY 90 tablet 3  . fenofibrate 160 MG tablet TAKE 1 TABLET BY MOUTH EVERY DAY 90 tablet  3  . finasteride (PROSCAR) 5 MG tablet TAKE 1 TABLET(5 MG) BY MOUTH DAILY 90 tablet 1  . furosemide (LASIX) 40 MG tablet TAKE 1 TABLET BY MOUTH EVERY DAY. TAKE AN EXTRA TABLET AS NEEDED FOR WEIGHT GAIN OF 3 LBS/ DAY OR 5 LBS/ WEEK 135 tablet 3  . Icosapent Ethyl (VASCEPA) 1 g CAPS Take 2 capsules (2 g total) by mouth 2 (two) times daily. 120 capsule 11  . ketoconazole (NIZORAL) 2 % cream Apply 1 application topically daily as needed (for dermatitits).     Marland Kitchen ketoconazole (NIZORAL) 2 % shampoo APP EXT TO THE SKIN 3 TIMES WEEKLY    . levothyroxine (SYNTHROID) 175 MCG tablet Take 1 tablet (175 mcg total) by mouth daily before breakfast.  90 tablet 1  . LINZESS 290 MCG CAPS capsule TAKE 1 CAPSULE(290 MCG) BY MOUTH DAILY 30 MINUTES BEFORE FIRST MEAL OF THE DAY ON AN EMPTY STOMACH 90 capsule 1  . lisinopril (PRINIVIL,ZESTRIL) 10 MG tablet Take 1 tablet (10 mg total) by mouth daily. 90 tablet 3  . loratadine (CLARITIN) 10 MG tablet Take 1 tablet (10 mg total) by mouth daily. (Patient taking differently: Take 10 mg by mouth daily as needed. )    . Multiple Vitamins-Minerals (CENTRUM SILVER PO) Take 1 tablet by mouth daily.    . Multiple Vitamins-Minerals (PRESERVISION AREDS 2 PO) Take 1 tablet by mouth daily.    . nitroGLYCERIN (NITROSTAT) 0.4 MG SL tablet Place 1 tablet (0.4 mg total) under the tongue every 5 (five) minutes as needed for chest pain. 25 tablet 0  . potassium chloride (KLOR-CON) 10 MEQ tablet TAKE 1 TABLET(10 MEQ) BY MOUTH TWICE DAILY 180 tablet 1  . Psyllium (METAMUCIL FIBER) 51.7 % PACK Take 5-10 mLs by mouth daily.    . tamsulosin (FLOMAX) 0.4 MG CAPS capsule TAKE 2 CAPSULES(0.8 MG) BY MOUTH DAILY 180 capsule 1  . HYDROcodone-acetaminophen (NORCO/VICODIN) 5-325 MG tablet Take 1 tablet by mouth 3 (three) times daily as needed for moderate pain. 15 tablet 0   No facility-administered medications prior to visit.      Per HPI unless specifically indicated in ROS section below Review of Systems Objective:    BP 116/68 (BP Location: Right Arm, Patient Position: Sitting, Cuff Size: Large)   Pulse 99   Temp 97.6 F (36.4 C) (Temporal)   Ht 5\' 10"  (1.778 m)   Wt 290 lb 7 oz (131.7 kg)   SpO2 95%   BMI 41.67 kg/m   Wt Readings from Last 3 Encounters:  07/04/19 290 lb 7 oz (131.7 kg)  06/19/19 293 lb 8 oz (133.1 kg)  06/06/19 293 lb (132.9 kg)    Physical Exam Vitals signs and nursing note reviewed.  Constitutional:      General: He is not in acute distress.    Appearance: Normal appearance. He is obese. He is not ill-appearing.  HENT:     Mouth/Throat:     Mouth: Mucous membranes are moist.     Pharynx:  Oropharynx is clear. No posterior oropharyngeal erythema.  Cardiovascular:     Rate and Rhythm: Normal rate and regular rhythm.     Pulses: Normal pulses.     Heart sounds: Normal heart sounds. No murmur.  Pulmonary:     Effort: Pulmonary effort is normal. No respiratory distress.     Breath sounds: Normal breath sounds. No wheezing, rhonchi or rales.  Musculoskeletal:     Right lower leg: No edema.     Left lower  leg: No edema.  Neurological:     Mental Status: He is alert.  Psychiatric:        Mood and Affect: Mood normal.        Behavior: Behavior normal.        Assessment & Plan:   Problem List Items Addressed This Visit    Therapeutic opioid-induced constipation (OIC)    H/o this. Discussed caution with narcotic use.       S/P insertion of spinal cord stimulator   Recurrent major depressive disorder, in partial remission (HCC)    Stable period only on wellbutrin      Obesity, morbid, BMI 40.0-49.9 (HCC)    Stable period, some weight loss noted likely due increased walking routine. Congratulated. Will continue wellbutrin at this time. Consider saxenda vs topamax in the future.       Encounter for chronic pain management - Primary    [place] CSRS reviewed 05/2019 and again today.  Reviewed risk vs benefits of chronic narcotic treatment. Has been off opiate since 2019. Aware of risks of dependence/tolerance and habit forming nature of medication. Planning to use sparingly PRN severe pain.       Relevant Orders   Pain Mgmt, Profile 8 w/Conf, U   Chronic lower back pain    Rare PRN hydrocodone. Encouraged continued walking and efforts towards weight loss.       Relevant Medications   HYDROcodone-acetaminophen (NORCO/VICODIN) 5-325 MG tablet   Other Relevant Orders   Pain Mgmt, Profile 8 w/Conf, U       Meds ordered this encounter  Medications  . HYDROcodone-acetaminophen (NORCO/VICODIN) 5-325 MG tablet    Sig: Take 1 tablet by mouth 2 (two) times daily as needed  for moderate pain.    Dispense:  20 tablet    Refill:  0    Use this #   Orders Placed This Encounter  Procedures  . Pain Mgmt, Profile 8 w/Conf, U    Order Specific Question:   Prescribed drugs 1:    Answer:   HYDROCODONE    Patient Instructions  Will prescribe hydrocodone /acetaminophen 5/325mg  to use sparingly Fill out controlled substance agreement and UDS today.  Continue wellbutrin for now.  Good to see you today.  Return as needed or in 3 months for physical.    Follow up plan: Return in about 3 months (around 10/04/2019) for annual exam, prior fasting for blood work.  Ria Bush, MD

## 2019-07-04 NOTE — Assessment & Plan Note (Addendum)
[place] CSRS reviewed 05/2019 and again today.  Reviewed risk vs benefits of chronic narcotic treatment. Has been off opiate since 2019. Aware of risks of dependence/tolerance and habit forming nature of medication. Planning to use sparingly PRN severe pain.

## 2019-07-05 NOTE — Assessment & Plan Note (Addendum)
H/o this. Discussed caution with narcotic use.

## 2019-07-05 NOTE — Assessment & Plan Note (Signed)
Stable period, some weight loss noted likely due increased walking routine. Congratulated. Will continue wellbutrin at this time. Consider saxenda vs topamax in the future.

## 2019-07-05 NOTE — Assessment & Plan Note (Signed)
Rare PRN hydrocodone. Encouraged continued walking and efforts towards weight loss.

## 2019-07-05 NOTE — Assessment & Plan Note (Addendum)
Stable period only on wellbutrin

## 2019-07-06 LAB — PAIN MGMT, PROFILE 8 W/CONF, U
6 Acetylmorphine: NEGATIVE ng/mL
Alcohol Metabolites: NEGATIVE ng/mL (ref <500)
Amphetamines: NEGATIVE ng/mL
Benzodiazepines: NEGATIVE ng/mL
Buprenorphine, Urine: NEGATIVE ng/mL
Cocaine Metabolite: NEGATIVE ng/mL
Creatinine: 68 mg/dL
MDA: NEGATIVE ng/mL
MDMA: NEGATIVE ng/mL
MDMA: NEGATIVE ng/mL
Marijuana Metabolite: NEGATIVE ng/mL
Opiates: NEGATIVE ng/mL
Oxidant: NEGATIVE ug/mL
Oxycodone: NEGATIVE ng/mL
pH: 4.9 (ref 4.5–9.0)

## 2019-07-07 ENCOUNTER — Telehealth: Payer: Self-pay | Admitting: Family Medicine

## 2019-07-07 ENCOUNTER — Ambulatory Visit: Payer: Medicare Other | Admitting: Family Medicine

## 2019-07-07 NOTE — Telephone Encounter (Signed)
Patient stated he spoke with the pharmacy in regards to picking up his HYDROCODONE script. He stated that the pharmacy advised he they could not fill the script until they received some type of form from our office for the medication. Patient is not sure what type of form they are needing .

## 2019-07-07 NOTE — Telephone Encounter (Signed)
plz call pharmacy to see what form they're talking about.

## 2019-07-07 NOTE — Telephone Encounter (Signed)
Name of Medication: Hydrocodone-APAP Name of Pharmacy: [facility] [facility] Last Fill or Written Date and Quantity: 07/04/19, #20 Last Office Visit and Type: 07/04/19, chronic pain mngmt Next Office Visit and Type: 11/06/19, CPE prt 2 Last Controlled Substance Agreement Date: 07/04/19 Last UDS: 07/04/19

## 2019-07-08 NOTE — Telephone Encounter (Addendum)
He is not naive. Was previously on hydrocodone chronic pain through pain clinic last filled 01/2018. I will fill out form.

## 2019-07-08 NOTE — Telephone Encounter (Signed)
Spoke with Audelia Acton at [facility] asking about pt's rx.  Told pt is hydrocodone naive so insurance will only allow 7 day fill.  Following 3 options available: -Fill 7 day rx, 4 tabs for $0.81, sacrifices rest of tabs -Full rx, pt pays $24.69 out of pocket - Prescriber override (will need to complete a form)  Pls advise.

## 2019-07-08 NOTE — Telephone Encounter (Addendum)
Spoke with Audelia Acton, says he will fax [name] the form.  Also, he provided phn # [phone] in case we need to speak with someone.

## 2019-07-09 NOTE — Telephone Encounter (Signed)
Submitted faxed PA request from [facility]; key:  [id number], PA case ID:  [id number].  Decision pending.

## 2019-07-09 NOTE — Telephone Encounter (Signed)
Received faxed PA approval for beyond 7-day supply valid through 08/26/2020.  Notified [facility].

## 2019-08-19 ENCOUNTER — Other Ambulatory Visit: Payer: Self-pay | Admitting: *Deleted

## 2019-08-19 MED ORDER — BUPROPION HCL ER (SR) 100 MG PO TB12: 100.0000 mg | ORAL_TABLET | Freq: Two times a day (BID) | ORAL | 2 refills | Status: DC

## 2019-09-13 DIAGNOSIS — E782 Mixed hyperlipidemia: Secondary | ICD-10-CM

## 2019-09-15 ENCOUNTER — Other Ambulatory Visit: Payer: Self-pay | Admitting: Family Medicine

## 2019-09-15 MED ORDER — TIZANIDINE HCL 4 MG PO TABS: 4.0000 mg | ORAL_TABLET | Freq: Two times a day (BID) | ORAL | 0 refills | Status: DC | PRN

## 2019-09-15 NOTE — Telephone Encounter (Signed)
Spoke with pt to get clarification on muscle relaxer he is requesting.  Informed him I did not see one on current med list or previous list.  Says it may have been before he started at this office.  Asked about sxs, since he would need an office visit.  States he has been walking about 2 mi daily and is having pain in upper thighs.  Pt declined OV stating he does not see why he needs it to get rx.  Tried to explain, however, pt still did not want OV and asked that message be forwarded to Dr. Darnell Level.

## 2019-09-15 NOTE — Telephone Encounter (Signed)
Pt is requesting a muscle relaxer to be sent in. I advised him that he will most likely need an appointment for this but he states he didn't need one before and he needs them as soon as possible. He did not know the name of the medication he is requesting.

## 2019-09-15 NOTE — Addendum Note (Signed)
Addended by: Ria Bush on: 09/15/2019 06:06 PM   Modules accepted: Orders

## 2019-09-15 NOTE — Telephone Encounter (Signed)
plz notify tizanidine sent in for patient.  Caution with sedation on this medicine.

## 2019-09-15 NOTE — Addendum Note (Signed)
Addended by: Harley Fitzwater E on: 09/15/2019 09:50 AM   Modules accepted: Orders

## 2019-09-17 NOTE — Telephone Encounter (Signed)
Pt notified as instructed by phone.  Verbalizes understanding.  

## 2019-09-18 ENCOUNTER — Telehealth: Payer: Self-pay | Admitting: Family Medicine

## 2019-09-18 NOTE — Telephone Encounter (Signed)
Spoke with pt answering questions about labs.

## 2019-09-18 NOTE — Telephone Encounter (Signed)
Patient is requesting a call back from you  Did not stated what he was needing just that he wanted to speak to you

## 2019-09-25 ENCOUNTER — Ambulatory Visit: Payer: Medicare Other

## 2019-09-25 DIAGNOSIS — L578 Other skin changes due to chronic exposure to nonionizing radiation: Secondary | ICD-10-CM | POA: Diagnosis not present

## 2019-09-25 DIAGNOSIS — L821 Other seborrheic keratosis: Secondary | ICD-10-CM | POA: Diagnosis not present

## 2019-09-25 DIAGNOSIS — L820 Inflamed seborrheic keratosis: Secondary | ICD-10-CM | POA: Diagnosis not present

## 2019-09-25 DIAGNOSIS — Z85828 Personal history of other malignant neoplasm of skin: Secondary | ICD-10-CM | POA: Diagnosis not present

## 2019-09-25 DIAGNOSIS — D692 Other nonthrombocytopenic purpura: Secondary | ICD-10-CM | POA: Diagnosis not present

## 2019-09-25 DIAGNOSIS — L570 Actinic keratosis: Secondary | ICD-10-CM | POA: Diagnosis not present

## 2019-10-02 ENCOUNTER — Ambulatory Visit: Payer: Medicare Other

## 2019-10-06 ENCOUNTER — Other Ambulatory Visit: Payer: Medicare Other

## 2019-10-06 ENCOUNTER — Other Ambulatory Visit: Payer: Self-pay

## 2019-10-06 ENCOUNTER — Other Ambulatory Visit
Admission: RE | Admit: 2019-10-06 | Discharge: 2019-10-06 | Disposition: A | Discharge status: Home / Self Care | Payer: Medicare Other | Source: Ambulatory Visit | Attending: Internal Medicine | Admitting: Internal Medicine

## 2019-10-06 ENCOUNTER — Other Ambulatory Visit: Payer: Self-pay | Admitting: Family Medicine

## 2019-10-06 ENCOUNTER — Ambulatory Visit: Payer: Medicare Other

## 2019-10-06 DIAGNOSIS — E782 Mixed hyperlipidemia: Secondary | ICD-10-CM | POA: Diagnosis not present

## 2019-10-06 LAB — LIPID PANEL
Cholesterol: 102 mg/dL (ref 0–200)
HDL: 41 mg/dL (ref >40)
LDL Cholesterol: 24 mg/dL (ref 0–99)
Total CHOL/HDL Ratio: 2.5 RATIO
Triglycerides: 184 mg/dL — ABNORMAL HIGH (ref <150)
VLDL: 37 mg/dL (ref 0–40)

## 2019-10-06 LAB — HEPATIC FUNCTION PANEL
ALT: 29 U/L (ref 0–44)
AST: 29 U/L (ref 15–41)
Albumin: 4 g/dL (ref 3.5–5.0)
Alkaline Phosphatase: 43 U/L (ref 38–126)
Bilirubin, Direct: <0.1 mg/dL (ref 0.0–0.2)
Total Bilirubin: 0.7 mg/dL (ref 0.3–1.2)
Total Protein: 7.5 g/dL (ref 6.5–8.1)

## 2019-10-06 NOTE — Telephone Encounter (Signed)
Noted  

## 2019-10-06 NOTE — Telephone Encounter (Signed)
Medication request has been sent to PCP for approval.

## 2019-10-06 NOTE — Telephone Encounter (Signed)
Pt called needing to get a refill on hydrocodone   Pt stated he has a bad pain in right foot center bottom offered appointment pt declined.  He states he has to take every 3-4 hours to help with pain at night  Pt has appointment for cpx 3/11  [facility] [address]  Pt would like 90 pills

## 2019-10-06 NOTE — Telephone Encounter (Signed)
Last refill: 11.6.20 #20, 0 Last OV: 11.6.20 dx. Encounter for chronic pain management

## 2019-10-07 MED ORDER — HYDROCODONE-ACETAMINOPHEN 5-325 MG PO TABS: 1.0000 | ORAL_TABLET | Freq: Two times a day (BID) | ORAL | 0 refills | Status: DC | PRN

## 2019-10-07 NOTE — Addendum Note (Signed)
Addended by: Ria Bush on: 10/07/2019 12:48 PM   Modules accepted: Orders

## 2019-10-07 NOTE — Telephone Encounter (Signed)
Explained to patient that he will need an acute visit to re-evaluate his new, worsening onset of right foot pain as this is contributing to an increased use of Hydrocodone which is not using as prescribed and we cannot increase quantity without further eval.    Patient verbalizes understanding and did set up an appointment for tomorrow at 1015 with Dr. Darnell Level to evaluate his acute need.

## 2019-10-07 NOTE — Telephone Encounter (Signed)
Spoke with [employer].  Will fill #20 hydrocodone (last filled 06/2019) Recommend acute visit for worsening foot pain.

## 2019-10-08 ENCOUNTER — Ambulatory Visit (INDEPENDENT_AMBULATORY_CARE_PROVIDER_SITE_OTHER): Payer: Medicare Other | Admitting: Family Medicine

## 2019-10-08 ENCOUNTER — Encounter: Payer: Self-pay | Admitting: Family Medicine

## 2019-10-08 ENCOUNTER — Other Ambulatory Visit: Payer: Self-pay

## 2019-10-08 VITALS — BP 122/82 | HR 102 | Temp 97.6°F | Ht 70.0 in | Wt 288.1 lb

## 2019-10-08 DIAGNOSIS — F5104 Psychophysiologic insomnia: Secondary | ICD-10-CM

## 2019-10-08 DIAGNOSIS — M79671 Pain in right foot: Secondary | ICD-10-CM

## 2019-10-08 MED ORDER — ESZOPICLONE 3 MG PO TABS: 3.0000 mg | ORAL_TABLET | Freq: Every evening | ORAL | 0 refills | Status: DC | PRN

## 2019-10-08 MED ORDER — GABAPENTIN 100 MG PO CAPS: 100.0000 mg | ORAL_CAPSULE | Freq: Every day | ORAL | 3 refills | Status: DC

## 2019-10-08 NOTE — Assessment & Plan Note (Signed)
Continue sparing lunesta.

## 2019-10-08 NOTE — Patient Instructions (Addendum)
Possible neuropathic (nerve) pain  Let's try gabapentin 100mg  at night time for this pain. If tolerated well, may increase to 2 pills at night and update me with effect.   Peripheral Neuropathy Peripheral neuropathy is a type of nerve damage. It affects nerves that carry signals between the spinal cord and the arms, legs, and the rest of the body (peripheral nerves). It does not affect nerves in the spinal cord or brain. In peripheral neuropathy, one nerve or a group of nerves may be damaged. Peripheral neuropathy is a broad category that includes many specific nerve disorders, like diabetic neuropathy, hereditary neuropathy, and carpal tunnel syndrome. What are the causes? This condition may be caused by:  Diabetes. This is the most common cause of peripheral neuropathy.  Nerve injury.  Pressure or stress on a nerve that lasts a long time.  Lack (deficiency) of B vitamins. This can result from alcoholism, poor diet, or a restricted diet.  Infections.  Autoimmune diseases, such as rheumatoid arthritis and systemic lupus erythematosus.  Nerve diseases that are passed from parent to child (inherited).  Some medicines, such as cancer medicines (chemotherapy).  Poisonous (toxic) substances, such as lead and mercury.  Too little blood flowing to the legs.  Kidney disease.  Thyroid disease. In some cases, the cause of this condition is not known. What are the signs or symptoms? Symptoms of this condition depend on which of your nerves is damaged. Common symptoms include:  Loss of feeling (numbness) in the feet, hands, or both.  Tingling in the feet, hands, or both.  Burning pain.  Very sensitive skin.  Weakness.  Not being able to move a part of the body (paralysis).  Muscle twitching.  Clumsiness or poor coordination.  Loss of balance.  Not being able to control your bladder.  Feeling dizzy.  Sexual problems. How is this diagnosed? Diagnosing and finding the  cause of peripheral neuropathy can be difficult. Your health care provider will take your medical history and do a physical exam. A neurological exam will also be done. This involves checking things that are affected by your brain, spinal cord, and nerves (nervous system). For example, your health care provider will check your reflexes, how you move, and what you can feel. You may have other tests, such as:  Blood tests.  Electromyogram (EMG) and nerve conduction tests. These tests check nerve function and how well the nerves are controlling the muscles.  Imaging tests, such as CT scans or MRI to rule out other causes of your symptoms.  Removing a small piece of nerve to be examined in a lab (nerve biopsy). This is rare.  Removing and examining a small amount of the fluid that surrounds the brain and spinal cord (lumbar puncture). This is rare. How is this treated? Treatment for this condition may involve:  Treating the underlying cause of the neuropathy, such as diabetes, kidney disease, or vitamin deficiencies.  Stopping medicines that can cause neuropathy, such as chemotherapy.  Medicine to relieve pain. Medicines may include: ? Prescription or over-the-counter pain medicine. ? Antiseizure medicine. ? Antidepressants. ? Pain-relieving patches that are applied to painful areas of skin.  Surgery to relieve pressure on a nerve or to destroy a nerve that is causing pain.  Physical therapy to help improve movement and balance.  Devices to help you move around (assistive devices). Follow these instructions at home: Medicines  Take over-the-counter and prescription medicines only as told by your health care provider. Do not take any other  medicines without first asking your health care provider.  Do not drive or use heavy machinery while taking prescription pain medicine. Lifestyle   Do not use any products that contain nicotine or tobacco, such as cigarettes and e-cigarettes.  Smoking keeps blood from reaching damaged nerves. If you need help quitting, ask your health care provider.  Avoid or limit alcohol. Too much alcohol can cause a vitamin B deficiency, and vitamin B is needed for healthy nerves.  Eat a healthy diet. This includes: ? Eating foods that are high in fiber, such as fresh fruits and vegetables, whole grains, and beans. ? Limiting foods that are high in fat and processed sugars, such as fried or sweet foods. General instructions   If you have diabetes, work closely with your health care provider to keep your blood sugar under control.  If you have numbness in your feet: ? Check every day for signs of injury or infection. Watch for redness, warmth, and swelling. ? Wear padded socks and comfortable shoes. These help protect your feet.  Develop a good support system. Living with peripheral neuropathy can be stressful. Consider talking with a mental health specialist or joining a support group.  Use assistive devices and attend physical therapy as told by your health care provider. This may include using a walker or a cane.  Keep all follow-up visits as told by your health care provider. This is important. Contact a health care provider if:  You have new signs or symptoms of peripheral neuropathy.  You are struggling emotionally from dealing with peripheral neuropathy.  Your pain is not well-controlled. Get help right away if:  You have an injury or infection that is not healing normally.  You develop new weakness in an arm or leg.  You fall frequently. Summary  Peripheral neuropathy is when the nerves in the arms, or legs are damaged, resulting in numbness, weakness, or pain.  There are many causes of peripheral neuropathy, including diabetes, pinched nerves, vitamin deficiencies, autoimmune disease, and hereditary conditions.  Diagnosing and finding the cause of peripheral neuropathy can be difficult. Your health care provider will  take your medical history, do a physical exam, and do tests, including blood tests and nerve function tests.  Treatment involves treating the underlying cause of the neuropathy and taking medicines to help control pain. Physical therapy and assistive devices may also help. This information is not intended to replace advice given to you by your health care provider. Make sure you discuss any questions you have with your health care provider. Document Revised: 07/27/2017 Document Reviewed: 10/23/2016 Elsevier Patient Education  2020 [employer].

## 2019-10-08 NOTE — Assessment & Plan Note (Signed)
Reassuring exam today. Not consistent with claudication or with stress fracture or podagra.  Anticipate component of neuropathic pain given description of worse pain at rest and when supine associated with paresthesias.  Will continue sparing hydrocodone PRN, and prescribe gabapentin 100-200mg  at night time. Update with effect.  No h/o DM, significant alcohol. Consider B12 level next labs.

## 2019-10-08 NOTE — Progress Notes (Signed)
This visit was conducted in person.  BP 122/82 (BP Location: Left Arm, Patient Position: Sitting, Cuff Size: Large)   Pulse (!) 102   Temp 97.6 F (36.4 C) (Temporal)   Ht 5\' 10"  (1.778 m)   Wt 288 lb 1 oz (130.7 kg)   SpO2 95%   BMI 41.33 kg/m    CC: R foot pain Subjective:    Patient ID: Anthony [name], male    DOB: Mar 01, 1936, 84 y.o.   MRN: [id number]  HPI: Anthony [name] is a 84 y.o. male presenting on 10/08/2019 for Foot Pain (C/o right foot pain.  Started about 2 wks ago.  Denies injury. )   See recent phone note.  2 wk h/o R foot pain without inciting trauma/injury or falls. Points to dorsal foot. No redness, swelling, warmth of foot. He does walk 1 hour (2 miles) regularly every morning. No pain with walking. No fevers/chills, nausea. Describes pain as sharp with occasional tingling, worse at night or at rest.   Also noticing worsening bilateral posterior thigh pain for the last 2 weeks. Planning to see PM&R for this.   No h/o gout.  Requests lunesta refilled. Sparing use.     Relevant past medical, surgical, family and social history reviewed and updated as indicated. Interim medical history since our last visit reviewed. Allergies and medications reviewed and updated. Outpatient Medications Prior to Visit  Medication Sig Dispense Refill  . Alirocumab (PRALUENT) 75 MG/ML SOAJ Inject 1 pen into the skin every 14 (fourteen) days. 2 pen 11  . aspirin EC 81 MG tablet Take 1 tablet (81 mg total) by mouth daily.    . Azelastine HCl 0.15 % SOLN U 1 TO 2 SPRAYS IEN BID    . benzonatate (TESSALON) 100 MG capsule Take 1 capsule (100 mg total) by mouth 3 (three) times daily as needed for cough. 30 capsule 0  . buPROPion (WELLBUTRIN SR) 100 MG 12 hr tablet Take 1 tablet (100 mg total) by mouth 2 (two) times daily. 60 tablet 2  . Cholecalciferol (VITAMIN D3) 1000 units CAPS Take 1 capsule (1,000 Units total) by mouth daily. 30 capsule   . diphenhydramine-acetaminophen (TYLENOL  PM EXTRA STRENGTH) 25-500 MG TABS tablet Take 1 tablet by mouth at bedtime as needed. (Patient taking differently: Take 1 tablet by mouth at bedtime as needed (for sleep.). )    . docusate sodium (COLACE) 100 MG capsule Take 100 mg by mouth 2 (two) times daily.    Marland Kitchen ezetimibe (ZETIA) 10 MG tablet TAKE 1 TABLET(10 MG) BY MOUTH DAILY 90 tablet 3  . fenofibrate 160 MG tablet TAKE 1 TABLET BY MOUTH EVERY DAY 90 tablet 3  . finasteride (PROSCAR) 5 MG tablet TAKE 1 TABLET(5 MG) BY MOUTH DAILY 90 tablet 0  . furosemide (LASIX) 40 MG tablet TAKE 1 TABLET BY MOUTH EVERY DAY. TAKE AN EXTRA TABLET AS NEEDED FOR WEIGHT GAIN OF 3 LBS/ DAY OR 5 LBS/ WEEK 135 tablet 3  . HYDROcodone-acetaminophen (NORCO/VICODIN) 5-325 MG tablet Take 1 tablet by mouth 2 (two) times daily as needed for moderate pain. 20 tablet 0  . Icosapent Ethyl (VASCEPA) 1 g CAPS Take 2 capsules (2 g total) by mouth 2 (two) times daily. 120 capsule 11  . ketoconazole (NIZORAL) 2 % cream Apply 1 application topically daily as needed (for dermatitits).     Marland Kitchen ketoconazole (NIZORAL) 2 % shampoo APP EXT TO THE SKIN 3 TIMES WEEKLY    . levothyroxine (SYNTHROID) 175  MCG tablet Take 1 tablet (175 mcg total) by mouth daily before breakfast. 90 tablet 1  . LINZESS 290 MCG CAPS capsule TAKE 1 CAPSULE(290 MCG) BY MOUTH DAILY 30 MINUTES BEFORE FIRST MEAL OF THE DAY ON AN EMPTY STOMACH 90 capsule 1  . lisinopril (PRINIVIL,ZESTRIL) 10 MG tablet Take 1 tablet (10 mg total) by mouth daily. 90 tablet 3  . loratadine (CLARITIN) 10 MG tablet Take 1 tablet (10 mg total) by mouth daily. (Patient taking differently: Take 10 mg by mouth daily as needed. )    . Multiple Vitamins-Minerals (CENTRUM SILVER PO) Take 1 tablet by mouth daily.    . Multiple Vitamins-Minerals (PRESERVISION AREDS 2 PO) Take 1 tablet by mouth daily.    . nitroGLYCERIN (NITROSTAT) 0.4 MG SL tablet Place 1 tablet (0.4 mg total) under the tongue every 5 (five) minutes as needed for chest pain. 25  tablet 0  . potassium chloride (KLOR-CON) 10 MEQ tablet TAKE 1 TABLET(10 MEQ) BY MOUTH TWICE DAILY 180 tablet 1  . Psyllium (METAMUCIL FIBER) 51.7 % PACK Take 5-10 mLs by mouth daily.    . tamsulosin (FLOMAX) 0.4 MG CAPS capsule TAKE 2 CAPSULES(0.8 MG) BY MOUTH DAILY 180 capsule 0  . tiZANidine (ZANAFLEX) 4 MG tablet Take 1 tablet (4 mg total) by mouth 2 (two) times daily as needed for muscle spasms (sedation precautions). 30 tablet 0  . Eszopiclone 3 MG TABS TAKE 1 TABLET BY MOUTH AT BEDTIME AS NEEDED 30 tablet 0   No facility-administered medications prior to visit.     Per HPI unless specifically indicated in ROS section below Review of Systems Objective:    BP 122/82 (BP Location: Left Arm, Patient Position: Sitting, Cuff Size: Large)   Pulse (!) 102   Temp 97.6 F (36.4 C) (Temporal)   Ht 5\' 10"  (1.778 m)   Wt 288 lb 1 oz (130.7 kg)   SpO2 95%   BMI 41.33 kg/m   Wt Readings from Last 3 Encounters:  10/08/19 288 lb 1 oz (130.7 kg)  07/04/19 290 lb 7 oz (131.7 kg)  06/19/19 293 lb 8 oz (133.1 kg)    Physical Exam Vitals and nursing note reviewed.  Constitutional:      Appearance: Normal appearance. He is not ill-appearing.  Musculoskeletal:        General: No swelling, tenderness or signs of injury. Normal range of motion.     Right lower leg: No edema.     Left lower leg: No edema.     Comments:  1+ DP bilaterally L foot WNL R foot - no reproducible pain to palpation of navicular, base of 5th MT, no pain along MT shafts throughout, no pain or swelling at 1st MTPJ, no pain to percussion of malleoli, neg calcaneal squeeze test, no ligament laxity, no weakness with testing of foot against resistance in dorsiflexion, plantar flexion, inversion or eversion  Skin:    General: Skin is warm and dry.     Capillary Refill: Capillary refill takes less than 2 seconds.  Neurological:     Mental Status: He is alert.  Psychiatric:        Mood and Affect: Mood normal.         Behavior: Behavior normal.       Results for orders placed or performed during the hospital encounter of 10/06/19  Lipid Profile  Result Value Ref Range   Cholesterol 102 0 - 200 mg/dL   Triglycerides 184 (H) <150 mg/dL   HDL 41 >  40 mg/dL   Total CHOL/HDL Ratio 2.5 RATIO   VLDL 37 0 - 40 mg/dL   LDL Cholesterol 24 0 - 99 mg/dL  Hepatic function panel  Result Value Ref Range   Total Protein 7.5 6.5 - 8.1 g/dL   Albumin 4.0 3.5 - 5.0 g/dL   AST 29 15 - 41 U/L   ALT 29 0 - 44 U/L   Alkaline Phosphatase 43 38 - 126 U/L   Total Bilirubin 0.7 0.3 - 1.2 mg/dL   Bilirubin, Direct <0.1 0.0 - 0.2 mg/dL   Indirect Bilirubin NOT CALCULATED 0.3 - 0.9 mg/dL  No results found for: VITAMINB12  Assessment & Plan:  This visit occurred during the SARS-CoV-2 public health emergency.  Safety protocols were in place, including screening questions prior to the visit, additional usage of staff PPE, and extensive cleaning of exam room while observing appropriate contact time as indicated for disinfecting solutions.   Problem List Items Addressed This Visit    Right foot pain - Primary    Reassuring exam today. Not consistent with claudication or with stress fracture or podagra.  Anticipate component of neuropathic pain given description of worse pain at rest and when supine associated with paresthesias.  Will continue sparing hydrocodone PRN, and prescribe gabapentin 100-200mg  at night time. Update with effect.  No h/o DM, significant alcohol. Consider B12 level next labs.       Chronic insomnia    Continue sparing lunesta.           Meds ordered this encounter  Medications  . gabapentin (NEURONTIN) 100 MG capsule    Sig: Take 1-2 capsules (100-200 mg total) by mouth at bedtime.    Dispense:  60 capsule    Refill:  3  . Eszopiclone 3 MG TABS    Sig: Take 1 tablet (3 mg total) by mouth at bedtime as needed. Take immediately before bedtime    Dispense:  30 tablet    Refill:  0   No orders  of the defined types were placed in this encounter.   Patient instructions: Possible neuropathic (nerve) pain  Let's try gabapentin 100mg  at night time for this pain. If tolerated well, may increase to 2 pills at night and update me with effect.   Follow up plan: Return if symptoms worsen or fail to improve.  Ria Bush, MD

## 2019-10-20 ENCOUNTER — Telehealth: Payer: Self-pay | Admitting: Family Medicine

## 2019-10-20 NOTE — Chronic Care Management (AMB) (Signed)
  Chronic Care Management   Note  10/20/2019 Name: KAEGAN HETTICH MRN: [id number] DOB: [birth year]  [name] is a 84 y.o. year old male who is a primary care patient of Ria Bush, MD. I reached out to [name] by phone today in response to a referral sent by Mr. Gustave Lindeman Langlinais's PCP, Ria Bush, MD.   Mr. Sandt was given information about Chronic Care Management services today including:  1. CCM service includes personalized support from designated clinical staff supervised by his physician, including individualized plan of care and coordination with other care providers 2. 24/7 contact phone numbers for assistance for urgent and routine care needs. 3. Service will only be billed when office clinical staff spend 20 minutes or more in a month to coordinate care. 4. Only one practitioner may furnish and bill the service in a calendar month. 5. The patient may stop CCM services at any time (effective at the end of the month) by phone call to the office staff. 6. The patient will be responsible for cost sharing (co-pay) of up to 20% of the service fee (after annual deductible is met).  Patient agreed to services and verbal consent obtained.   Follow up plan:   Raynicia Dukes [employer] Scheduler

## 2019-10-27 ENCOUNTER — Other Ambulatory Visit: Payer: Self-pay | Admitting: Family Medicine

## 2019-10-27 ENCOUNTER — Telehealth: Payer: Self-pay

## 2019-10-27 DIAGNOSIS — K219 Gastro-esophageal reflux disease without esophagitis: Secondary | ICD-10-CM

## 2019-10-27 DIAGNOSIS — I10 Essential (primary) hypertension: Secondary | ICD-10-CM

## 2019-10-27 NOTE — Telephone Encounter (Signed)
I would like to request a referral for Anthony [name] to chronic care management pharmacy services for the following conditions:   Essential hypertension, benign  [I10]  GERD [K21.9]  Debbora Dus, PharmD Clinical Pharmacist [facility] [phone]

## 2019-10-28 ENCOUNTER — Other Ambulatory Visit: Payer: Self-pay | Admitting: Family Medicine

## 2019-10-28 DIAGNOSIS — E039 Hypothyroidism, unspecified: Secondary | ICD-10-CM

## 2019-10-28 DIAGNOSIS — E559 Vitamin D deficiency, unspecified: Secondary | ICD-10-CM

## 2019-10-28 DIAGNOSIS — E782 Mixed hyperlipidemia: Secondary | ICD-10-CM

## 2019-10-28 NOTE — Telephone Encounter (Signed)
E-scribed refills.  

## 2019-10-29 NOTE — Chronic Care Management (AMB) (Signed)
Chronic Care Management Pharmacy  Name: Anthony [name]  MRN: [id number] DOB: Jul 31, 1936  Chief Complaint/ HPI  Anthony [name],  84 y.o., male presents for their Initial CCM visit with the clinical pharmacist via telephone.  PCP : Ria Bush, MD  Their chronic conditions include: hypercholesterolemia, major depressive disorder, constipation, chronic pain, history of atrial flutter, coronary artery disease (stent placed 2013), hypertension, diastolic heart failure, hypothyroidism, sleep apnea, essential tremor, CKD, BPH, vitamin D deficiency, seasonal allergies  Patient concerns: denies medication concerns or questions   Office Visits:  10/07/18: Danise Mina - right foot pain, likely neuropathic, continue hydrocodone sparingly, start gabapentin 100-200 mg qhs, consider B12 level next labs, continue sparing Lunesta for insomnia    07/04/19: Danise Mina - hydrocodone PRN sparingly, continue wellbutrin   06/06/19: Danise Mina - severe obesity, discontinue naltrexone, continue wellbutrin BID for next month  Consult Visit:  10/06/19: Hypercholesterolemia - note not available  06/24/19: Hypercholesterolemia - start Praluent injection once every 14 days   Allergies  Allergen Reactions  . Questran [Cholestyramine]     Patient not aware of an allergy to this medicine.  . Atorvastatin     Muscle pain  . Gemfibrozil Other (See Comments)    Muscle pain.  . Metformin And Related     Dizziness  . Rosuvastatin     Muscle pain   Medications: Outpatient Encounter Medications as of 10/30/2019  Medication Sig Note  . Alirocumab (PRALUENT) 75 MG/ML SOAJ Inject 1 pen into the skin every 14 (fourteen) days.   Marland Kitchen aspirin EC 81 MG tablet Take 1 tablet (81 mg total) by mouth daily.   . Azelastine HCl 0.15 % SOLN U 1 TO 2 SPRAYS IEN BID   . benzonatate (TESSALON) 100 MG capsule Take 1 capsule (100 mg total) by mouth 3 (three) times daily as needed for cough.   Marland Kitchen buPROPion (WELLBUTRIN SR) 100 MG 12 hr  tablet Take 1 tablet (100 mg total) by mouth 2 (two) times daily.   . Cholecalciferol (VITAMIN D3) 1000 units CAPS Take 1 capsule (1,000 Units total) by mouth daily.   . diphenhydramine-acetaminophen (TYLENOL PM EXTRA STRENGTH) 25-500 MG TABS tablet Take 1 tablet by mouth at bedtime as needed. (Patient taking differently: Take 1 tablet by mouth at bedtime as needed (for sleep.). )   . docusate sodium (COLACE) 100 MG capsule Take 100 mg by mouth 2 (two) times daily.   . Eszopiclone 3 MG TABS Take 1 tablet (3 mg total) by mouth at bedtime as needed. Take immediately before bedtime   . ezetimibe (ZETIA) 10 MG tablet TAKE 1 TABLET(10 MG) BY MOUTH DAILY   . fenofibrate 160 MG tablet TAKE 1 TABLET BY MOUTH EVERY DAY   . finasteride (PROSCAR) 5 MG tablet TAKE 1 TABLET(5 MG) BY MOUTH DAILY   . furosemide (LASIX) 40 MG tablet TAKE 1 TABLET BY MOUTH EVERY DAY. TAKE AN EXTRA TABLET AS NEEDED FOR WEIGHT GAIN OF 3 LBS/ DAY OR 5 LBS/ WEEK   . gabapentin (NEURONTIN) 100 MG capsule Take 1-2 capsules (100-200 mg total) by mouth at bedtime.   Marland Kitchen HYDROcodone-acetaminophen (NORCO/VICODIN) 5-325 MG tablet Take 1 tablet by mouth 2 (two) times daily as needed for moderate pain.   Vanessa Kick Ethyl (VASCEPA) 1 g CAPS Take 2 capsules (2 g total) by mouth 2 (two) times daily.   Marland Kitchen ketoconazole (NIZORAL) 2 % cream Apply 1 application topically daily as needed (for dermatitits).    Marland Kitchen ketoconazole (NIZORAL) 2 % shampoo APP  EXT TO THE SKIN 3 TIMES WEEKLY   . levothyroxine (SYNTHROID) 175 MCG tablet TAKE 1 TABLET(175 MCG) BY MOUTH DAILY BEFORE BREAKFAST   . LINZESS 290 MCG CAPS capsule TAKE 1 CAPSULE(290 MCG) BY MOUTH DAILY 30 MINUTES BEFORE FIRST MEAL OF THE DAY ON AN EMPTY STOMACH   . lisinopril (ZESTRIL) 10 MG tablet TAKE 1 TABLET(10 MG) BY MOUTH DAILY   . loratadine (CLARITIN) 10 MG tablet Take 1 tablet (10 mg total) by mouth daily. (Patient taking differently: Take 10 mg by mouth daily as needed. )   . Multiple  Vitamins-Minerals (CENTRUM SILVER PO) Take 1 tablet by mouth daily.   . Multiple Vitamins-Minerals (PRESERVISION AREDS 2 PO) Take 1 tablet by mouth daily.   . nitroGLYCERIN (NITROSTAT) 0.4 MG SL tablet Place 1 tablet (0.4 mg total) under the tongue every 5 (five) minutes as needed for chest pain. 09/02/2018: Has not needed since prescribed eight years ago.  . potassium chloride (KLOR-CON) 10 MEQ tablet TAKE 1 TABLET(10 MEQ) BY MOUTH TWICE DAILY   . Psyllium (METAMUCIL FIBER) 51.7 % PACK Take 5-10 mLs by mouth daily.   . tamsulosin (FLOMAX) 0.4 MG CAPS capsule TAKE 2 CAPSULES(0.8 MG) BY MOUTH DAILY   . tiZANidine (ZANAFLEX) 4 MG tablet Take 1 tablet (4 mg total) by mouth 2 (two) times daily as needed for muscle spasms (sedation precautions).   . [DISCONTINUED] fenofibrate 160 MG tablet TAKE 1 TABLET BY MOUTH EVERY DAY   . [DISCONTINUED] finasteride (PROSCAR) 5 MG tablet TAKE 1 TABLET(5 MG) BY MOUTH DAILY   . [DISCONTINUED] levothyroxine (SYNTHROID) 175 MCG tablet Take 1 tablet (175 mcg total) by mouth daily before breakfast.   . [DISCONTINUED] lisinopril (PRINIVIL,ZESTRIL) 10 MG tablet Take 1 tablet (10 mg total) by mouth daily.   . [DISCONTINUED] tamsulosin (FLOMAX) 0.4 MG CAPS capsule TAKE 2 CAPSULES(0.8 MG) BY MOUTH DAILY    No facility-administered encounter medications on file as of 10/30/2019.   Current Diagnosis/Assessment: Goals    . Increase physical activity     Starting 10/24/2018, I will continue to exercise for at least 45 minutes daily.     . Patient Stated     10/30/2019, I will continue to walk for 1 hour everyday.     Marland Kitchen Pharmacy Care Plan     Current Barriers:  . Chronic Disease Management support, education, and care coordination needs related to hypercholesterolemia, major depressive disorder, constipation, chronic pain, history of atrial flutter, coronary artery disease (stent placed 2013), hypertension, diastolic heart failure, hypothyroidism, sleep apnea, essential tremor, CKD,  BPH, vitamin D deficiency, seasonal allergies  Pharmacist Clinical Goal(s):  Marland Kitchen Improve neuropathic pain control. Consider restarting gabapentin with higher dose titration. . Maintain up to date medication list. Remove bupropion for weight loss. Please see your pharmacy for a refill on fenofibrate.  . Improve time to sleep and sleep maintenance. Continue current medications. Will discuss alternatives after we adjust your pain medications.  . Remain up to date on vaccinations. Recommend new shingles vaccines (Shingrix).   Interventions: . Comprehensive medication review performed.  Patient Self Care Activities:  . Self administers medications as prescribed  Initial goal documentation        Hyperlipidemia/CAD  Followed by cardiology  Lipid Panel     Component Value Date/Time   CHOL 102 10/06/2019 1031   TRIG 184 (H) 10/06/2019 1031   HDL 41 10/06/2019 1031   CHOLHDL 2.5 10/06/2019 1031   VLDL 37 10/06/2019 1031   LDLCALC 24 10/06/2019 1031  LDLDIRECT 117.0 10/24/2018 1115    CAD: LDL goal < 70 Patient has failed these meds in past: intolerance to satins, cholestyramine, and gemfibrozil (myalgias) Patient is currently controlled on the following medications:   Praluent 75 mg/mL - Inject every 14 days  Ezetimibe 10 mg - 1 tablet daily  Fenofibrate 160mg  - 1 tablet daily  Icosapent Ethyl (Vascepa) 1 gm - 2 capsules BID  Aspirin 81 mg - 1 tablet daily  Nitroglycerin 0.4 mg SL - PRN  We discussed: pt denies having fenofibrate at home and unsure if he is still taking, per refill history - due for refill 10/30/19; reports tolerating Praluent well  Plan: Continue current medications; Per last cardiology note, continue fenofibrate.  Heart Failure   Type: Diastolic Last ejection fraction: 01/2017, normal LVEF  Patient has failed these meds in past: none  Patient is currently controlled on the following medications:   Furosemide 40 mg - 1 tablet daily and as needed for  extra weight   Potassium Chloride 10 mEq - 1 tablet BID  We discussed: takes furosemide daily, denies SOB, denies swelling, potassium WNL   Plan: Continue current medications  Hypertension   Office blood pressures are  BP Readings from Last 3 Encounters:  10/08/19 122/82  07/04/19 116/68  06/19/19 124/84   CMP Latest Ref Rng & Units 10/30/2019 10/06/2019 12/17/2018  Glucose 70 - 99 mg/dL 111(H) - 116(H)  BUN 6 - 23 mg/dL 28(H) - 28(H)  Creatinine 0.40 - 1.50 mg/dL 1.56(H) - 1.22  Sodium 135 - 145 mEq/L 135 - 137  Potassium 3.5 - 5.1 mEq/L 4.5 - 4.0  Chloride 96 - 112 mEq/L 102 - 103  CO2 19 - 32 mEq/L 29 - 29  Calcium 8.4 - 10.5 mg/dL 10.4 - 10.7(H)  Total Protein 6.5 - 8.1 g/dL - 7.5 -  Total Bilirubin 0.3 - 1.2 mg/dL - 0.7 -  Alkaline Phos 38 - 126 U/L - 43 -  AST 15 - 41 U/L - 29 -  ALT 0 - 44 U/L - 29 -   CPAP: wears nightly Patient has failed these meds in the past: none Patient checks BP at home infrequently; daughter or son check occasionally  Patient home BP readings are ranging: none reported Patient is currently controlled on the following medications:   Lisinopril 10 mg - 1 tablet daily  Plan: Continue current medications   Allergic Rhinitis   Patient has failed these meds in past: none Patient is currently controlled on the following medications:   Azelastine nasal spray - 1-2 sprays in each nostril BID PRN  Loratadine 10 mg - 1 tablet daily  (PRN)  Benzonatate 100 mg - 1 capsule TID as needed for cough (not taking)  We discussed: denies regular allergies, but occasional watery eyes, nasal congestion - uses all PRN  Plan: Continue current medications  BPH   Patient has failed these meds in past: none Patient is currently controlled on the following medications:   Tamsulosin 0.4 mg - 2 capsule daily  Finasteride 5 mg - 1 tablet daily  We discussed: reports symptoms stable  Plan: Continue current medications  Neuropathy/Chronic pain   Symptoms: nerve pain down leg to foot, dull and aching back pain Patient has failed these meds in past: tramadol - not effective  Patient is currently uncontrolled on the following medications:   Gabapentin 100 mg - take 1-2 capsule at bedtime (not taking)  Hydrocodone/acetaminophen 5-325 mg - 1 tablet BID PRN  Tizanidine 4 mg -  1 tablet BID PRN (rarely uses)  We discussed: Effective dosing of gabapentin, pt tried 200 mg at night and did not notice a difference so discontinued, he is open to trying a higher dose. Rarely uses tizanidine, states he was previously on a stronger muscle relaxer. Reports Norco does not help his nerve pain, which is uncontrolled. Also reports receiving 15 Norco per month, would like to have enough to take 1 or 2 daily and prefers 3 month supplies.   Bowel regimen:   Linzess 290 mcg - 1 capsule daily 30 minutes before first meal of the day   Docusate 100 mg - 1 tablet BID  Psyllium - 5-10 mLs daily - not taking  We discussed: fairly well controlled on current therapy  Plan: Continue current medications; Pt will discuss hydrocodone concerns and desire for stronger muscle relaxant with PCP at [facility] on 11/06/19. Pt may benefit from higher dose of gabapentin.  Obesity/MDD  Patient denies concerns about mood; bupropion was initiated with naltrexone for weight loss, naltrexone was discontinued due to no weight loss   Patient has failed these meds in past: naltrexone  Patient is currently uncontrolled on the following medications:   Bupropion 100 mg - 1 tablet BID (denies taking currently)   We discussed:  Started around 08/20; pt is not sure if he is taking, doesn't have the pill bottle at home. Reports weight has not changed, continues to exercise - [place] the mall.   Plan: If not taking, do not recommend re-initiation as weight has remained unchanged.   Insomnia    Patient has failed these meds in past: 'several'   Patient is currently controlled on  the following medications:   Tylenol PM extra strength 25-500 mg - 2 tablet daily at bedtime PRN sleep  (Lunesta) Eszopiclone 3 mg - 1 tablet at bedtime PRN  Melatonin 10 mg - qhs   We discussed: Reports taking 2 Tylenol PM every night, then Lunesta several hours later if he wakes up and cannot fall back asleep. Prefers to avoid Lunesta due to next day drowsiness, not feeling well rested; interested in trying trazodone or doxepin   Plan: Continue current medications; Review alternatives after adjusting pain medications as current medications are not working well for patient.   Hypothyroidism   TSH: 12/17/18: 0.72  Patient has failed these meds in past: none Patient is currently controlled on the following medications:   Levothyroxine 175 mcg - 1 tablet daily before breakfast  Plan: Continue current medications  Vitamin D Deficiency   Vit D3 (10/28/18): 51 Patient has failed these meds in past: none Patient is currently controlled on the following medications:   Vitamin D3 1000 IU - 1 capsule daily   Plan: Continue current medications  Misc: Ketoconazole 2% shampoo - apply 3 times weekly (uses almost daily), topical, fluocinonide topical solution - applies after shampoo   CKD   CrCl: 52 ml/min (adjusted body weight: 100 kg; Scr: 1.56) GFR: (10/30/19) 43 m/miin  Medication Management  OTCs: Multivitamin Centrum Silver, Preservision Areds daily, Stool softener extra strength, azelastine nasal spray - PRN, Mucinex   Pharmacy: [facility], [facility]: [place]  --> son organizes patient's medications - fills pillbox every 2 weeks; would like to have medications synchronized and 90 DS (wants to ask son about packaging or vials )  Adherence: out of refills on bupropion, fenofibrate  Affordability: would like assistance with comparing medicare plans/costs, as well as pharmacy costs   Takes all meds at either  BF, bedtime   Vaccines: Recommend Shingrix  CCM Follow Up: November 11, 2019  3:45 PM telephone   Debbora Dus, PharmD Clinical Pharmacist [facility] [phone]  ----- Dr. Danise Mina,  Mr. Berner tried gabapentin 200 mg qhs last month and has since discontinued (reports it was ineffective). He is still experiencing nerve pain. We discussed trying a higher dose following a titration schedule, such as gabapentin 300 mg on day 1-3, if tolerated, increase to 300 mg twice daily days 4-6, if tolerated, increase to 300 mg TID on day 7. His Scr did increase with lab work yesterday to 1.56, but 900 mg/day would still be appropriate for current CrCl. He also believes he discontinued bupropion, which he was taking for weight loss. As it didn't seem to impact his weight, we discussed continuing off of it for now. Let me know your thoughts. He has an office visit with you on 3/11.   Thanks! Sharyn Lull

## 2019-10-30 ENCOUNTER — Other Ambulatory Visit: Payer: Self-pay

## 2019-10-30 ENCOUNTER — Other Ambulatory Visit (INDEPENDENT_AMBULATORY_CARE_PROVIDER_SITE_OTHER): Payer: Medicare Other

## 2019-10-30 ENCOUNTER — Ambulatory Visit: Payer: Medicare Other

## 2019-10-30 ENCOUNTER — Ambulatory Visit (INDEPENDENT_AMBULATORY_CARE_PROVIDER_SITE_OTHER): Payer: Medicare Other

## 2019-10-30 DIAGNOSIS — E782 Mixed hyperlipidemia: Secondary | ICD-10-CM

## 2019-10-30 DIAGNOSIS — N400 Enlarged prostate without lower urinary tract symptoms: Secondary | ICD-10-CM

## 2019-10-30 DIAGNOSIS — M1990 Unspecified osteoarthritis, unspecified site: Secondary | ICD-10-CM

## 2019-10-30 DIAGNOSIS — I2510 Atherosclerotic heart disease of native coronary artery without angina pectoris: Secondary | ICD-10-CM

## 2019-10-30 DIAGNOSIS — E039 Hypothyroidism, unspecified: Secondary | ICD-10-CM

## 2019-10-30 DIAGNOSIS — E559 Vitamin D deficiency, unspecified: Secondary | ICD-10-CM

## 2019-10-30 DIAGNOSIS — N183 Chronic kidney disease, stage 3 unspecified: Secondary | ICD-10-CM

## 2019-10-30 DIAGNOSIS — Z0000 Encounter for general adult medical examination without abnormal findings: Secondary | ICD-10-CM | POA: Diagnosis not present

## 2019-10-30 DIAGNOSIS — G8929 Other chronic pain: Secondary | ICD-10-CM

## 2019-10-30 DIAGNOSIS — F5104 Psychophysiologic insomnia: Secondary | ICD-10-CM

## 2019-10-30 DIAGNOSIS — J302 Other seasonal allergic rhinitis: Secondary | ICD-10-CM

## 2019-10-30 DIAGNOSIS — K219 Gastro-esophageal reflux disease without esophagitis: Secondary | ICD-10-CM

## 2019-10-30 DIAGNOSIS — Z8679 Personal history of other diseases of the circulatory system: Secondary | ICD-10-CM

## 2019-10-30 DIAGNOSIS — K5909 Other constipation: Secondary | ICD-10-CM

## 2019-10-30 DIAGNOSIS — I10 Essential (primary) hypertension: Secondary | ICD-10-CM

## 2019-10-30 DIAGNOSIS — I5030 Unspecified diastolic (congestive) heart failure: Secondary | ICD-10-CM

## 2019-10-30 LAB — BASIC METABOLIC PANEL
BUN: 28 mg/dL — ABNORMAL HIGH (ref 6–23)
CO2: 29 mEq/L (ref 19–32)
Calcium: 10.4 mg/dL (ref 8.4–10.5)
Chloride: 102 mEq/L (ref 96–112)
Creatinine, Ser: 1.56 mg/dL — ABNORMAL HIGH (ref 0.40–1.50)
GFR: 42.82 mL/min — ABNORMAL LOW (ref >60.00)
Glucose, Bld: 111 mg/dL — ABNORMAL HIGH (ref 70–99)
Potassium: 4.5 mEq/L (ref 3.5–5.1)
Sodium: 135 mEq/L (ref 135–145)

## 2019-10-30 LAB — TSH: TSH: 2.88 u[IU]/mL (ref 0.35–4.50)

## 2019-10-30 LAB — VITAMIN D 25 HYDROXY (VIT D DEFICIENCY, FRACTURES): VITD: 36.58 ng/mL (ref 30.00–100.00)

## 2019-10-30 NOTE — Progress Notes (Signed)
Subjective:   Anthony [name] is a 84 y.o. male who presents for Medicare Annual/Subsequent preventive examination.  Review of Systems: N/A   This visit is being conducted through telemedicine via telephone at the nurse health advisor's home address due to the COVID-19 pandemic. This patient has given me verbal consent via doximity to conduct this visit, patient states they are participating from their home address. Patient and myself are on the telephone call. There is no referral for this visit. Some vital signs may be absent or patient reported.    Patient identification: identified by name, DOB, and current address   Cardiac Risk Factors include: advanced age (>31men, >70 women);male gender;hypertension;Other (see comment), Risk factor comments: hypercholesterolemia     Objective:    Vitals: There were no vitals taken for this visit.  There is no height or weight on file to calculate BMI.  Advanced Directives 10/30/2019 10/24/2018 08/22/2018 03/08/2018 12/27/2017 10/11/2017 09/13/2017  Does Patient Have a Medical Advance Directive? Yes Yes Yes Yes Yes Yes Yes  Type of Paramedic of [place];Living will [place];Living will [place];Living will [place];Living will [place];Living will [place];Living will [place];Living will  Does patient want to make changes to medical advance directive? - - No - Patient declined No - Patient declined - - -  Copy of [place] in Chart? No - copy requested No - copy requested No - copy requested No - copy requested No - copy requested No - copy requested No - copy requested  Would patient like information on creating a medical advance directive? - - - - - - -    Tobacco Social History   Tobacco Use  Smoking Status Never Smoker  Smokeless Tobacco Never Used     Counseling given: Not  Answered   Clinical Intake:  Pre-visit preparation completed: Yes  Pain : 0-10 Pain Score: 7  Pain Type: Chronic pain Pain Location: Back Pain Orientation: Lower Pain Descriptors / Indicators: Aching Pain Onset: More than a month ago Pain Frequency: Constant     Nutritional Risks: None Diabetes: No  How often do you need to have someone help you when you read instructions, pamphlets, or other written materials from your doctor or pharmacy?: 1 - Never What is the last grade level you completed in school?: 12th  Interpreter Needed?: No  Information entered by :: CJohnson, LPN  Past Medical History:  Diagnosis Date  . Anosmia 1980s  . Atrial flutter ([place]) 2018   had an ablation with dr. Erick Alley  . CAD (coronary artery disease)   . Cancer ([place])    skin cancer  . Chronic constipation   . Chronic insomnia   . Chronic lower back pain    s/p spine stimulator placement  . Complication of anesthesia    when under general, he woke up agitated and unable to be held down  . Diastolic CHF ([place])   . Dyspnea    had prior to having cardiac stent placed  . History of diverticulitis 2017  . History of pneumonia 2013  . Hyperlipidemia   . Hypertension   . Hypothyroidism   . Obesity, Class II, BMI 35-39.9, with comorbidity   . OSA on CPAP    Past Surgical History:  Procedure Laterality Date  . A-FLUTTER ABLATION N/A 05/07/2017   Procedure: A-Flutter Ablation;  Surgeon: Deboraha Sprang, MD;  Location: [facility];  Service: Cardiovascular;  Laterality: N/A;  . CARDIOVASCULAR STRESS TEST  11/2017   no ischemia, low risk study  . COLONOSCOPY  2007   normal per prior PCP records, rpt 10 yrs (Dr Osie Cheeks)  . ELBOW SURGERY Right    ulnar nerve decompression.  PT DOES NOT RECALL THIS PROCEDURE  . LAMINECTOMY THORACIC SPINE W/ PLACEMENT SPINAL CORD STIMULATOR  08/2018   and removal of prior spine stimulator (Dr Lacinda Axon)  . NASAL SINUS SURGERY     nasal polyps. done a long time  ago  . PERCUTANEOUS CORONARY STENT INTERVENTION (PCI-S)  2013   EF55%, 70% mid LAD, 99% mid RCA, mild MR, elev LVEDP, DES to mid LAD. RCA is nondominant  . RADIOFREQUENCY ABLATION  06/2017   lumbar region [facility])   . REPLACEMENT TOTAL KNEE BILATERAL Bilateral 2000s  . SPINAL CORD STIMULATOR IMPLANT  06/2016  . SPINAL CORD STIMULATOR INSERTION N/A 09/09/2018   Procedure: LUMBAR SPINAL CORD STIMULATOR LEAD AND BATTERY REMOVAL AND LAMINECTOMY FOR PLACEMENT OF PADDLE;  Surgeon: Deetta Perla, MD;  Location: [facility];  Service: Neurosurgery;  Laterality: N/A;  . SPINAL CORD STIMULATOR INSERTION N/A 09/16/2018   Procedure: PLACEMENT OF SPINAL CORD STIMULATOR BATTERY;  Surgeon: Deetta Perla, MD;  Location: [facility] OR[facility];  Service: Neurosurgery;  Laterality: N/A;   Family History  Problem Relation Age of Onset  . Stroke Mother   . Kidney disease Father   . Leukemia Brother   . Stroke Sister   . Diabetes Neg Hx    Social History   Socioeconomic History  . Marital status: Widowed    Spouse name: Not on file  . Number of children: Not on file  . Years of education: Not on file  . Highest education level: Not on file  Occupational History  . Not on file  Tobacco Use  . Smoking status: Never Smoker  . Smokeless tobacco: Never Used  Substance and Sexual Activity  . Alcohol use: No    Comment: QUIT DRINKING  . Drug use: No  . Sexual activity: Not Currently  Other Topics Concern  . Not on file  Social History Narrative   Widower (wife passed 08/2014).    Lives alone, GF Miss [name] sometimes (she was nurse)   Son is [name] (FP at [employer])   [place]: retired, used to [employer] store   Edu: [employer]   Social Determinants of Health   Financial Resource Strain: [place]   . Difficulty of Paying Living Expenses: Not hard at all  Food Insecurity: No Food Insecurity  . Worried About Charity fundraiser in the Last Year: Never true  . Ran Out of Food in the Last Year: Never  true  Transportation Needs: No Transportation Needs  . Lack of Transportation (Medical): No  . Lack of Transportation (Non-Medical): No  Physical Activity: Sufficiently Active  . Days of Exercise per Week: 7 days  . Minutes of Exercise per Session: 60 min  Stress: No Stress Concern Present  . Feeling of Stress : Not at all  Social Connections:   . Frequency of Communication with Friends and Family: Not on file  . Frequency of Social Gatherings with Friends and Family: Not on file  . Attends Religious Services: Not on file  . Active Member of Clubs or Organizations: Not on file  . Attends Archivist Meetings: Not on file  . Marital Status: Not on file    Outpatient Encounter Medications  as of 10/30/2019  Medication Sig  . Alirocumab (PRALUENT) 75 MG/ML SOAJ Inject 1 pen into the skin every 14 (fourteen) days.  Marland Kitchen aspirin EC 81 MG tablet Take 1 tablet (81 mg total) by mouth daily.  . Azelastine HCl 0.15 % SOLN U 1 TO 2 SPRAYS IEN BID  . benzonatate (TESSALON) 100 MG capsule Take 1 capsule (100 mg total) by mouth 3 (three) times daily as needed for cough.  Marland Kitchen buPROPion (WELLBUTRIN SR) 100 MG 12 hr tablet Take 1 tablet (100 mg total) by mouth 2 (two) times daily.  . Cholecalciferol (VITAMIN D3) 1000 units CAPS Take 1 capsule (1,000 Units total) by mouth daily.  . diphenhydramine-acetaminophen (TYLENOL PM EXTRA STRENGTH) 25-500 MG TABS tablet Take 1 tablet by mouth at bedtime as needed. (Patient taking differently: Take 1 tablet by mouth at bedtime as needed (for sleep.). )  . docusate sodium (COLACE) 100 MG capsule Take 100 mg by mouth 2 (two) times daily.  . Eszopiclone 3 MG TABS Take 1 tablet (3 mg total) by mouth at bedtime as needed. Take immediately before bedtime  . ezetimibe (ZETIA) 10 MG tablet TAKE 1 TABLET(10 MG) BY MOUTH DAILY  . fenofibrate 160 MG tablet TAKE 1 TABLET BY MOUTH EVERY DAY  . finasteride (PROSCAR) 5 MG tablet TAKE 1 TABLET(5 MG) BY MOUTH DAILY  . furosemide  (LASIX) 40 MG tablet TAKE 1 TABLET BY MOUTH EVERY DAY. TAKE AN EXTRA TABLET AS NEEDED FOR WEIGHT GAIN OF 3 LBS/ DAY OR 5 LBS/ WEEK  . gabapentin (NEURONTIN) 100 MG capsule Take 1-2 capsules (100-200 mg total) by mouth at bedtime.  Marland Kitchen HYDROcodone-acetaminophen (NORCO/VICODIN) 5-325 MG tablet Take 1 tablet by mouth 2 (two) times daily as needed for moderate pain.  Vanessa Kick Ethyl (VASCEPA) 1 g CAPS Take 2 capsules (2 g total) by mouth 2 (two) times daily.  Marland Kitchen ketoconazole (NIZORAL) 2 % cream Apply 1 application topically daily as needed (for dermatitits).   Marland Kitchen ketoconazole (NIZORAL) 2 % shampoo APP EXT TO THE SKIN 3 TIMES WEEKLY  . levothyroxine (SYNTHROID) 175 MCG tablet TAKE 1 TABLET(175 MCG) BY MOUTH DAILY BEFORE BREAKFAST  . LINZESS 290 MCG CAPS capsule TAKE 1 CAPSULE(290 MCG) BY MOUTH DAILY 30 MINUTES BEFORE FIRST MEAL OF THE DAY ON AN EMPTY STOMACH  . lisinopril (ZESTRIL) 10 MG tablet TAKE 1 TABLET(10 MG) BY MOUTH DAILY  . loratadine (CLARITIN) 10 MG tablet Take 1 tablet (10 mg total) by mouth daily. (Patient taking differently: Take 10 mg by mouth daily as needed. )  . Multiple Vitamins-Minerals (CENTRUM SILVER PO) Take 1 tablet by mouth daily.  . Multiple Vitamins-Minerals (PRESERVISION AREDS 2 PO) Take 1 tablet by mouth daily.  . nitroGLYCERIN (NITROSTAT) 0.4 MG SL tablet Place 1 tablet (0.4 mg total) under the tongue every 5 (five) minutes as needed for chest pain.  . potassium chloride (KLOR-CON) 10 MEQ tablet TAKE 1 TABLET(10 MEQ) BY MOUTH TWICE DAILY  . Psyllium (METAMUCIL FIBER) 51.7 % PACK Take 5-10 mLs by mouth daily.  . tamsulosin (FLOMAX) 0.4 MG CAPS capsule TAKE 2 CAPSULES(0.8 MG) BY MOUTH DAILY  . tiZANidine (ZANAFLEX) 4 MG tablet Take 1 tablet (4 mg total) by mouth 2 (two) times daily as needed for muscle spasms (sedation precautions).   No facility-administered encounter medications on file as of 10/30/2019.    Activities of Daily Living In your present state of health, do you  have any difficulty performing the following activities: 10/30/2019  Hearing? N  Vision?  N  Difficulty concentrating or making decisions? N  Walking or climbing stairs? N  Dressing or bathing? N  Doing errands, shopping? N  Preparing Food and eating ? N  Using the Toilet? N  In the past six months, have you accidently leaked urine? N  Do you have problems with loss of bowel control? N  Managing your Medications? N  Managing your Finances? N  Housekeeping or managing your Housekeeping? N  Some recent data might be hidden    Patient Care Team: Ria Bush, MD as PCP - General (Family Medicine) Gillis Santa, MD as Consulting Physician (Pain Medicine) Deboraha Sprang, MD as Consulting Physician (Cardiology) Debbora Dus, [facility] as Pharmacist (Pharmacist)   Assessment:   This is a routine wellness examination for [place].  Exercise Activities and Dietary recommendations Current Exercise Habits: Home exercise routine, Type of exercise: walking, Time (Minutes): 60, Frequency (Times/Week): 7, Weekly Exercise (Minutes/Week): 420, Intensity: Moderate, Exercise limited by: None identified  Goals    . Increase physical activity     Starting 10/24/2018, I will continue to exercise for at least 45 minutes daily.     . Patient Stated     10/30/2019, I will continue to walk for 1 hour everyday.     Marland Kitchen Pharmacy Care Plan     Current Barriers:  . Chronic Disease Management support, education, and care coordination needs related to   Pharmacist Clinical Goal(s):  Marland Kitchen   Interventions: . Comprehensive medication review performed.  Patient Self Care Activities:  . Self administers medications as prescribed  Initial goal documentation        Fall Risk Fall Risk  10/30/2019 10/24/2018 02/07/2018 12/27/2017 10/18/2017  Falls in the past year? 0 0 No No No  Number falls in past yr: 0 - - - -  Injury with Fall? 0 - - - -  Risk for fall due to : Medication side effect - - - -  Follow up Falls  evaluation completed;Falls prevention discussed - - - -   Is the patient's home free of loose throw rugs in walkways, pet beds, electrical cords, etc?   yes      Grab bars in the bathroom? no      Handrails on the stairs?   no      Adequate lighting?   yes  Timed Get Up and Go Performed: N/A  Depression Screen PHQ 2/9 Scores 10/30/2019 10/24/2018 02/07/2018 12/27/2017  PHQ - 2 Score 0 0 0 0  PHQ- 9 Score 0 0 - -    Cognitive Function MMSE - Mini Mental State Exam 10/30/2019 10/24/2018 10/11/2017  Orientation to time 5 5 5   Orientation to Place 5 5 5   Registration 3 3 3   Attention/ Calculation 5 0 0  Recall 3 1 3   Recall-comments - unable to recall 2 of 3 words -  Language- name 2 objects - 0 0  Language- repeat 1 1 1   Language- follow 3 step command - 2 3  Language- follow 3 step command-comments - unable to follow 1 step of 3 step command -  Language- read & follow direction - 0 0  Write a sentence - 0 0  Copy design - 0 0  Total score - 17 20  Mini Cog  Mini-Cog screen was completed. Maximum score is 22. A value of 0 denotes this part of the MMSE was not completed or the patient failed this part of the Mini-Cog screening.       Immunization  History  Administered Date(s) Administered  . Fluad Quad(high Dose 65+) 05/07/2019  . Hepatitis A, Adult 12/12/1996, 06/30/1997  . Influenza Split 07/16/2006, 06/16/2008, 05/17/2009, 07/04/2010, 07/05/2011, 06/18/2012  . Influenza, High Dose Seasonal PF 06/03/2015, 05/18/2016  . Influenza,inj,Quad PF,6+ Mos 06/04/2013, 04/22/2014, 07/24/2017, 05/23/2018  . Pneumococcal Conjugate-13 10/20/2013  . Pneumococcal Polysaccharide-23 02/17/2011, 10/09/2011  . Pneumococcal-Unspecified 08/28/2001  . Td 08/28/2001, 10/09/2011  . Zoster 08/28/2013    Qualifies for Shingles Vaccine: Yes  Screening Tests Health Maintenance  Topic Date Due  . DTAP VACCINES (1) 12/05/1935  . DTaP/Tdap/Td (3 - Tdap) 10/08/2021  . TETANUS/TDAP  10/08/2021  .  INFLUENZA VACCINE  Completed  . PNA vac Low Risk Adult  Completed   Cancer Screenings: Lung: Low Dose CT Chest recommended if Age 78-80 years, 30 pack-year currently smoking OR have quit w/in 15years. Patient does not qualify. Colorectal: no longer required  Additional Screenings:  Hepatitis C Screening: N/A      Plan:    Patient will continue to walk everyday for 1 hour.  I have personally reviewed and noted the following in the patient's chart:   . Medical and social history . Use of alcohol, tobacco or illicit drugs  . Current medications and supplements . Functional ability and status . Nutritional status . Physical activity . Advanced directives . List of other physicians . Hospitalizations, surgeries, and ER visits in previous 12 months . Vitals . Screenings to include cognitive, depression, and falls . Referrals and appointments  In addition, I have reviewed and discussed with patient certain preventive protocols, quality metrics, and best practice recommendations. A written personalized care plan for preventive services as well as general preventive health recommendations were provided to patient.     Andrez Grime, LPN  [id number]

## 2019-10-30 NOTE — Patient Instructions (Signed)
Anthony [name] , Thank you for taking time to come for your Medicare Wellness Visit. I appreciate your ongoing commitment to your health goals. Please review the following plan we discussed and let me know if I can assist you in the future.   Screening recommendations/referrals: Colonoscopy: no longer required Recommended yearly ophthalmology/optometry visit for glaucoma screening and checkup Recommended yearly dental visit for hygiene and checkup  Vaccinations: Influenza vaccine: Up to date, completed 05/07/2019 Pneumococcal vaccine: Completed series Tdap vaccine: Up to date, completed 10/09/2011 Shingles vaccine: discussed    Advanced directives: Please bring a copy of your POA (Power of [place]) and/or Living Will to your next appointment.   Conditions/risks identified: hypertension, hypercholesterolemia   Next appointment: 11/06/2019 @ 10:30 am   Preventive Care 6 Years and Older, Male Preventive care refers to lifestyle choices and visits with your health care provider that can promote health and wellness. What does preventive care include?  A yearly physical exam. This is also called an annual well check.  Dental exams once or twice a year.  Routine eye exams. Ask your health care provider how often you should have your eyes checked.  Personal lifestyle choices, including:  Daily care of your teeth and gums.  Regular physical activity.  Eating a healthy diet.  Avoiding tobacco and drug use.  Limiting alcohol use.  Practicing safe sex.  Taking low doses of aspirin every day.  Taking vitamin and mineral supplements as recommended by your health care provider. What happens during an annual well check? The services and screenings done by your health care provider during your annual well check will depend on your age, overall health, lifestyle risk factors, and family history of disease. Counseling  Your health care provider may ask you questions about your:  Alcohol  use.  Tobacco use.  Drug use.  Emotional well-being.  Home and relationship well-being.  Sexual activity.  Eating habits.  History of falls.  Memory and ability to understand (cognition).  Work and work Statistician. Screening  You may have the following tests or measurements:  Height, weight, and BMI.  Blood pressure.  Lipid and cholesterol levels. These may be checked every 5 years, or more frequently if you are over 66 years old.  Skin check.  Lung cancer screening. You may have this screening every year starting at age 51 if you have a 30-pack-year history of smoking and currently smoke or have quit within the past 15 years.  Fecal occult blood test (FOBT) of the stool. You may have this test every year starting at age 77.  Flexible sigmoidoscopy or colonoscopy. You may have a sigmoidoscopy every 5 years or a colonoscopy every 10 years starting at age 23.  Prostate cancer screening. Recommendations will vary depending on your family history and other risks.  Hepatitis C blood test.  Hepatitis B blood test.  Sexually transmitted disease (STD) testing.  Diabetes screening. This is done by checking your blood sugar (glucose) after you have not eaten for a while (fasting). You may have this done every 1-3 years.  Abdominal aortic aneurysm (AAA) screening. You may need this if you are a current or former smoker.  Osteoporosis. You may be screened starting at age 71 if you are at high risk. Talk with your health care provider about your test results, treatment options, and if necessary, the need for more tests. Vaccines  Your health care provider may recommend certain vaccines, such as:  Influenza vaccine. This is recommended every year.  Tetanus,  diphtheria, and acellular pertussis (Tdap, Td) vaccine. You may need a Td booster every 10 years.  Zoster vaccine. You may need this after age 32.  Pneumococcal 13-valent conjugate (PCV13) vaccine. One dose is  recommended after age 83.  Pneumococcal polysaccharide (PPSV23) vaccine. One dose is recommended after age 62. Talk to your health care provider about which screenings and vaccines you need and how often you need them. This information is not intended to replace advice given to you by your health care provider. Make sure you discuss any questions you have with your health care provider. Document Released: 09/10/2015 Document Revised: 05/03/2016 Document Reviewed: 06/15/2015 Elsevier Interactive Patient Education  2017 [place] Prevention in the Home Falls can cause injuries. They can happen to people of all ages. There are many things you can do to make your home safe and to help prevent falls. What can I do on the outside of my home?  Regularly fix the edges of walkways and driveways and fix any cracks.  Remove anything that might make you trip as you walk through a door, such as a raised step or threshold.  Trim any bushes or trees on the path to your home.  Use bright outdoor lighting.  Clear any walking paths of anything that might make someone trip, such as rocks or tools.  Regularly check to see if handrails are loose or broken. Make sure that both sides of any steps have handrails.  Any raised decks and porches should have guardrails on the edges.  Have any leaves, snow, or ice cleared regularly.  Use sand or salt on walking paths during winter.  Clean up any spills in your garage right away. This includes oil or grease spills. What can I do in the bathroom?  Use night lights.  Install grab bars by the toilet and in the tub and shower. Do not use towel bars as grab bars.  Use non-skid mats or decals in the tub or shower.  If you need to sit down in the shower, use a plastic, non-slip stool.  Keep the floor dry. Clean up any water that spills on the floor as soon as it happens.  Remove soap buildup in the tub or shower regularly.  Attach bath mats  securely with double-sided non-slip rug tape.  Do not have throw rugs and other things on the floor that can make you trip. What can I do in the bedroom?  Use night lights.  Make sure that you have a light by your bed that is easy to reach.  Do not use any sheets or blankets that are too big for your bed. They should not hang down onto the floor.  Have a firm chair that has side arms. You can use this for support while you get dressed.  Do not have throw rugs and other things on the floor that can make you trip. What can I do in the kitchen?  Clean up any spills right away.  Avoid walking on wet floors.  Keep items that you use a lot in easy-to-reach places.  If you need to reach something above you, use a strong step stool that has a grab bar.  Keep electrical cords out of the way.  Do not use floor polish or wax that makes floors slippery. If you must use wax, use non-skid floor wax.  Do not have throw rugs and other things on the floor that can make you trip. What can I do with  my stairs?  Do not leave any items on the stairs.  Make sure that there are handrails on both sides of the stairs and use them. Fix handrails that are broken or loose. Make sure that handrails are as long as the stairways.  Check any carpeting to make sure that it is firmly attached to the stairs. Fix any carpet that is loose or worn.  Avoid having throw rugs at the top or bottom of the stairs. If you do have throw rugs, attach them to the floor with carpet tape.  Make sure that you have a light switch at the top of the stairs and the bottom of the stairs. If you do not have them, ask someone to add them for you. What else can I do to help prevent falls?  Wear shoes that:  Do not have high heels.  Have rubber bottoms.  Are comfortable and fit you well.  Are closed at the toe. Do not wear sandals.  If you use a stepladder:  Make sure that it is fully opened. Do not climb a closed  stepladder.  Make sure that both sides of the stepladder are locked into place.  Ask someone to hold it for you, if possible.  Clearly mark and make sure that you can see:  Any grab bars or handrails.  First and last steps.  Where the edge of each step is.  Use tools that help you move around (mobility aids) if they are needed. These include:  Canes.  Walkers.  Scooters.  Crutches.  Turn on the lights when you go into a dark area. Replace any light bulbs as soon as they burn out.  Set up your furniture so you have a clear path. Avoid moving your furniture around.  If any of your floors are uneven, fix them.  If there are any pets around you, be aware of where they are.  Review your medicines with your doctor. Some medicines can make you feel dizzy. This can increase your chance of falling. Ask your doctor what other things that you can do to help prevent falls. This information is not intended to replace advice given to you by your health care provider. Make sure you discuss any questions you have with your health care provider. Document Released: 06/10/2009 Document Revised: 01/20/2016 Document Reviewed: 09/18/2014 Elsevier Interactive Patient Education  2017 [employer].

## 2019-10-30 NOTE — Progress Notes (Signed)
PCP notes:  Health Maintenance: No gaps noted   Abnormal Screenings: none   Patient concerns: Patient wants to discuss his hydrocodone medication.    Nurse concerns: none   Next PCP appt.: 11/06/2019 @ 10:30 am

## 2019-10-31 ENCOUNTER — Other Ambulatory Visit: Payer: Self-pay

## 2019-10-31 MED ORDER — HYDROCODONE-ACETAMINOPHEN 5-325 MG PO TABS: 1.0000 | ORAL_TABLET | Freq: Two times a day (BID) | ORAL | 0 refills | Status: DC | PRN

## 2019-10-31 MED ORDER — GABAPENTIN 300 MG PO CAPS: ORAL_CAPSULE | ORAL | 1 refills | Status: DC

## 2019-10-31 NOTE — Progress Notes (Signed)
Noted. Agree. I have sent gabapentin to pharmacy.

## 2019-10-31 NOTE — Addendum Note (Signed)
Addended by: Ria Bush on: 10/31/2019 05:10 PM   Modules accepted: Orders

## 2019-10-31 NOTE — Telephone Encounter (Signed)
Pt contacted pharmacist requesting a refill on his hydrocodone. Is this something that could be done today or does the patient need to be seen?  Debbora Dus, PharmD Clinical Pharmacist [facility] at [facility] [phone]

## 2019-10-31 NOTE — Telephone Encounter (Addendum)
ERx I think the gabapentin taper by 300mg  at a time makes sense. Would like him to try this as well.  I have sent gabapentin 300mg  to the pharmacy

## 2019-10-31 NOTE — Patient Instructions (Addendum)
Dear Anthony [name],  It was a pleasure meeting you during our initial appointment on October 30, 2019. Below is a summary of the goals we discussed and components of chronic care management. Please contact me anytime with questions or concerns.   Visit Information Goals    . Increase physical activity     Starting 10/24/2018, I will continue to exercise for at least 45 minutes daily.     . Patient Stated     10/30/2019, I will continue to walk for 1 hour everyday.     Marland Kitchen Pharmacy Care Plan     Current Barriers:  . Chronic Disease Management support, education, and care coordination needs related to hypercholesterolemia, major depressive disorder, constipation, chronic pain, history of atrial flutter, coronary artery disease (stent placed 2013), hypertension, diastolic heart failure, hypothyroidism, sleep apnea, essential tremor, CKD, BPH, vitamin D deficiency, seasonal allergies  Pharmacist Clinical Goal(s):  Marland Kitchen Improve neuropathic pain control. Consider restarting gabapentin with higher dose titration. . Maintain up to date medication list. Remove bupropion for weight loss. Please see your pharmacy for a refill on fenofibrate.  . Improve time to sleep and sleep maintenance. Continue current medications. Will discuss alternatives after we adjust your pain medications.  . Remain up to date on vaccinations. Recommend new shingles vaccines (Shingrix).   Interventions: . Comprehensive medication review performed.  Patient Self Care Activities:  . Self administers medications as prescribed  Initial goal documentation       Anthony [name] was given information about Chronic Care Management services today including:  1. CCM service includes personalized support from designated clinical staff supervised by his physician, including individualized plan of care and coordination with other care providers 2. 24/7 contact phone numbers for assistance for urgent and routine care needs. 3. Standard  insurance, coinsurance, copays and deductibles apply for chronic care management only during months in which we provide at least 20 minutes of these services. Most insurances cover these services at 100%, however patients may be responsible for any copay, coinsurance and/or deductible if applicable. This service may help you avoid the need for more expensive face-to-face services. 4. Only one practitioner may furnish and bill the service in a calendar month. 5. The patient may stop CCM services at any time (effective at the end of the month) by phone call to the office staff.  Patient agreed to services and verbal consent obtained.   Print copy of patient instructions provided.  Telephone follow up appointment with pharmacy team member scheduled for: November 11, 2019 3:45 PM telephone   Debbora Dus, PharmD Clinical Pharmacist [facility] at [facility] [phone]  Recombinant Zoster (Shingles) Vaccine: What You Need to Know 1. Why get vaccinated? Recombinant zoster (shingles) vaccine can prevent shingles. Shingles (also called herpes zoster, or just zoster) is a painful skin rash, usually with blisters. In addition to the rash, shingles can cause fever, headache, chills, or upset stomach. More rarely, shingles can lead to pneumonia, hearing problems, blindness, brain inflammation (encephalitis), or death. The most common complication of shingles is long-term nerve pain called postherpetic neuralgia (PHN). PHN occurs in the areas where the shingles rash was, even after the rash clears up. It can last for months or years after the rash goes away. The pain from PHN can be severe and debilitating. About 10 to 18% of people who get shingles will experience PHN. The risk of PHN increases with age. An older adult with shingles is more likely to develop PHN and have  longer lasting and more severe pain than a younger person with shingles. Shingles is caused by the varicella zoster virus, the  same virus that causes chickenpox. After you have chickenpox, the virus stays in your body and can cause shingles later in life. Shingles cannot be passed from one person to another, but the virus that causes shingles can spread and cause chickenpox in someone who had never had chickenpox or received chickenpox vaccine. 2. Recombinant shingles vaccine Recombinant shingles vaccine provides strong protection against shingles. By preventing shingles, recombinant shingles vaccine also protects against PHN. Recombinant shingles vaccine is the preferred vaccine for the prevention of shingles. However, a different vaccine, live shingles vaccine, may be used in some circumstances. The recombinant shingles vaccine is recommended for adults 50 years and older without serious immune problems. It is given as a two-dose series. This vaccine is also recommended for people who have already gotten another type of shingles vaccine, the live shingles vaccine. There is no live virus in this vaccine. Shingles vaccine may be given at the same time as other vaccines. 3. Talk with your health care provider Tell your vaccine provider if the person getting the vaccine:  Has had an allergic reaction after a previous dose of recombinant shingles vaccine, or has any severe, life-threatening allergies.  Is pregnant or breastfeeding.  Is currently experiencing an episode of shingles. In some cases, your health care provider may decide to postpone shingles vaccination to a future visit. People with minor illnesses, such as a cold, may be vaccinated. People who are moderately or severely ill should usually wait until they recover before getting recombinant shingles vaccine. Your health care provider can give you more information. 4. Risks of a vaccine reaction  A sore arm with mild or moderate pain is very common after recombinant shingles vaccine, affecting about 80% of vaccinated people. Redness and swelling can also happen  at the site of the injection.  Tiredness, muscle pain, headache, shivering, fever, stomach pain, and nausea happen after vaccination in more than half of people who receive recombinant shingles vaccine. In clinical trials, about 1 out of 6 people who got recombinant zoster vaccine experienced side effects that prevented them from doing regular activities. Symptoms usually went away on their own in 2 to 3 days. You should still get the second dose of recombinant zoster vaccine even if you had one of these reactions after the first dose. People sometimes faint after medical procedures, including vaccination. Tell your provider if you feel dizzy or have vision changes or ringing in the ears. As with any medicine, there is a very remote chance of a vaccine causing a severe allergic reaction, other serious injury, or death. 5. What if there is a serious problem? An allergic reaction could occur after the vaccinated person leaves the clinic. If you see signs of a severe allergic reaction (hives, swelling of the face and throat, difficulty breathing, a fast heartbeat, dizziness, or weakness), call 9-1-1 and get the person to the nearest hospital. For other signs that concern you, call your health care provider. Adverse reactions should be reported to the Vaccine Adverse Event Reporting System (VAERS). Your health care provider will usually file this report, or you can do it yourself. Visit the VAERS website at www.vaers.SamedayNews.es or call 719 601 1122. VAERS is only for reporting reactions, and VAERS staff do not give medical advice. 6. How can I learn more?  Ask your health care provider.  Call your local or state health department.  Contact  the Centers for Disease Control and Prevention (CDC): ? Call (734)719-6959 (1-800-CDC-INFO) or ? Visit CDC's website at http://hunter.com/ Vaccine Information Statement Recombinant Zoster Vaccine (06/26/2018) This information is not intended to replace advice  given to you by your health care provider. Make sure you discuss any questions you have with your health care provider. Document Revised: 12/03/2018 Document Reviewed: 03/20/2018 Elsevier Patient Education  [place].

## 2019-10-31 NOTE — Telephone Encounter (Signed)
Name of Medication: Hydrocodone-APAP Name of Pharmacy: [facility]  Last Fill or Written Date and Quantity: 10/07/19, #20 Last Office Visit and Type: 10/08/19, foot pain Next Office Visit and Type: 11/06/19, AWV prt 2 Last Controlled Substance Agreement Date: 07/04/19 Last UDS: 07/04/19

## 2019-11-06 ENCOUNTER — Encounter: Payer: Self-pay | Admitting: Family Medicine

## 2019-11-06 ENCOUNTER — Other Ambulatory Visit: Payer: Self-pay

## 2019-11-06 ENCOUNTER — Telehealth: Payer: Self-pay | Admitting: Family Medicine

## 2019-11-06 ENCOUNTER — Ambulatory Visit (INDEPENDENT_AMBULATORY_CARE_PROVIDER_SITE_OTHER): Payer: Medicare Other | Admitting: Family Medicine

## 2019-11-06 VITALS — BP 124/82 | HR 106 | Temp 97.9°F | Ht 71.5 in | Wt 286.3 lb

## 2019-11-06 DIAGNOSIS — I10 Essential (primary) hypertension: Secondary | ICD-10-CM | POA: Diagnosis not present

## 2019-11-06 DIAGNOSIS — G8929 Other chronic pain: Secondary | ICD-10-CM

## 2019-11-06 DIAGNOSIS — M545 Low back pain, unspecified: Secondary | ICD-10-CM

## 2019-11-06 DIAGNOSIS — E782 Mixed hyperlipidemia: Secondary | ICD-10-CM | POA: Diagnosis not present

## 2019-11-06 DIAGNOSIS — E039 Hypothyroidism, unspecified: Secondary | ICD-10-CM | POA: Diagnosis not present

## 2019-11-06 DIAGNOSIS — M48061 Spinal stenosis, lumbar region without neurogenic claudication: Secondary | ICD-10-CM

## 2019-11-06 DIAGNOSIS — G4733 Obstructive sleep apnea (adult) (pediatric): Secondary | ICD-10-CM

## 2019-11-06 DIAGNOSIS — K5909 Other constipation: Secondary | ICD-10-CM

## 2019-11-06 DIAGNOSIS — Z9689 Presence of other specified functional implants: Secondary | ICD-10-CM

## 2019-11-06 DIAGNOSIS — Z7189 Other specified counseling: Secondary | ICD-10-CM | POA: Diagnosis not present

## 2019-11-06 DIAGNOSIS — E559 Vitamin D deficiency, unspecified: Secondary | ICD-10-CM

## 2019-11-06 DIAGNOSIS — I5030 Unspecified diastolic (congestive) heart failure: Secondary | ICD-10-CM

## 2019-11-06 DIAGNOSIS — I2510 Atherosclerotic heart disease of native coronary artery without angina pectoris: Secondary | ICD-10-CM

## 2019-11-06 DIAGNOSIS — I714 Abdominal aortic aneurysm, without rupture, unspecified: Secondary | ICD-10-CM

## 2019-11-06 DIAGNOSIS — N1832 Chronic kidney disease, stage 3b: Secondary | ICD-10-CM

## 2019-11-06 DIAGNOSIS — F3340 Major depressive disorder, recurrent, in remission, unspecified: Secondary | ICD-10-CM

## 2019-11-06 DIAGNOSIS — F5104 Psychophysiologic insomnia: Secondary | ICD-10-CM

## 2019-11-06 DIAGNOSIS — Z9989 Dependence on other enabling machines and devices: Secondary | ICD-10-CM

## 2019-11-06 MED ORDER — ACETAMINOPHEN 500 MG PO TABS: 500.0000 mg | ORAL_TABLET | Freq: Two times a day (BID) | ORAL | Status: AC | PRN

## 2019-11-06 MED ORDER — TRAZODONE HCL 50 MG PO TABS: 25.0000 mg | ORAL_TABLET | Freq: Every evening | ORAL | 3 refills | Status: DC | PRN

## 2019-11-06 NOTE — Progress Notes (Signed)
This visit was conducted in person.  BP 124/82 (BP Location: Left Arm, Patient Position: Sitting, Cuff Size: Large)   Pulse (!) 106   Temp 97.9 F (36.6 C) (Temporal)   Ht 5' 11.5" (1.816 m)   Wt 286 lb 5 oz (129.9 kg)   SpO2 96%   BMI 39.38 kg/m    CC: [name] f/u visit  Subjective:    Patient ID: Anthony [name], male    DOB: 11/18/1935, 84 y.o.   MRN: [id number]  HPI: Anthony [name] is a 84 y.o. male presenting on 11/06/2019 for Annual Exam (Prt 2. )   Saw health advisor last week for medicare wellness visit. Saw pharmacist for CCM last week as well. Notes reviewed.   Chronic lower back pain with R radiculopathy, spinal stenosis. He currently has spinal cord stimulator (Dr Lacinda Axon). Continues seeing technician for this about once a month. When pain flares, he takes 2 hydrocodone at a time. Planning to slowly increase gabapentin dosing for better pain control. He is currently on 600mg  at night - with some improvement.   Finds lunesta effective but overly drowsy. He has stopped tylenol PM. Interested in trial of trazodone or doxepin.   HLD - statin intolerance. Now on praluent and vascepa and zetia and fenofibrate.  No exam data present    Clinical Support from 10/30/2019 in [place] at [facility] Total Score  0      Fall Risk  10/30/2019 10/24/2018 02/07/2018 12/27/2017 10/18/2017  Falls in the past year? 0 0 No No No  Number falls in past yr: 0 - - - -  Injury with Fall? 0 - - - -  Risk for fall due to : Medication side effect - - - -  Follow up Falls evaluation completed;Falls prevention discussed - - - -      Preventative: Colon cancer screening -normal colonoscopy 2007. Denies bowel changes or blood in stool. No known fmhx colon cancer.Aged out.  Prostate cancer screening -aged out Lung cancer screening -not eligible Flu shot - yearly Td 2013 Pneumovax 2012, 2013, prevnar 2015 [place] covid vaccine series completed 09/2019.  zostavax 2015 shingrix -  discussed - not interested  Advanced directive discussion -sons would be HCPOA. Doesn't have living will. Packet provided today  Seat belt use discussed Sunscreen use discussed, no changing moles on skin. Sees derm Non smoker Alcohol - none Dentist doesn't see - full implants Eye exam due  Bowel - constipation - well managed with colace and lizness Bladder - no incontinence   Widower (wife passed 08/2014) Lives alone, GF Miss [name] sometimes (she was Marine scientist) Son nearby [place]: retired, used to [employer] store Edu: HS Activity: tries to do exercises at home - stationary bike Diet: good water, fruits/vegetables daily     Relevant past medical, surgical, family and social history reviewed and updated as indicated. Interim medical history since our last visit reviewed. Allergies and medications reviewed and updated. Outpatient Medications Prior to Visit  Medication Sig Dispense Refill  . Alirocumab (PRALUENT) 75 MG/ML SOAJ Inject 1 pen into the skin every 14 (fourteen) days. 2 pen 11  . aspirin EC 81 MG tablet Take 1 tablet (81 mg total) by mouth daily.    . Azelastine HCl 0.15 % SOLN U 1 TO 2 SPRAYS IEN BID    . benzonatate (TESSALON) 100 MG capsule Take 1 capsule (100 mg total) by mouth 3 (three) times daily as needed for cough. 30 capsule 0  .  buPROPion (WELLBUTRIN SR) 100 MG 12 hr tablet Take 1 tablet (100 mg total) by mouth 2 (two) times daily. 60 tablet 2  . Cholecalciferol (VITAMIN D3) 1000 units CAPS Take 1 capsule (1,000 Units total) by mouth daily. 30 capsule   . docusate sodium (COLACE) 100 MG capsule Take 100 mg by mouth 2 (two) times daily.    . Eszopiclone 3 MG TABS Take 1 tablet (3 mg total) by mouth at bedtime as needed. Take immediately before bedtime 30 tablet 0  . ezetimibe (ZETIA) 10 MG tablet TAKE 1 TABLET(10 MG) BY MOUTH DAILY 90 tablet 3  . fenofibrate 160 MG tablet TAKE 1 TABLET BY MOUTH EVERY DAY 90 tablet 0  . finasteride (PROSCAR) 5 MG tablet TAKE 1  TABLET(5 MG) BY MOUTH DAILY 90 tablet 0  . furosemide (LASIX) 40 MG tablet TAKE 1 TABLET BY MOUTH EVERY DAY. TAKE AN EXTRA TABLET AS NEEDED FOR WEIGHT GAIN OF 3 LBS/ DAY OR 5 LBS/ WEEK 135 tablet 3  . gabapentin (NEURONTIN) 300 MG capsule Take 1 capsule (300 mg total) by mouth at bedtime for 4 days, THEN 1 capsule (300 mg total) 2 (two) times daily. 60 capsule 1  . HYDROcodone-acetaminophen (NORCO/VICODIN) 5-325 MG tablet Take 1 tablet by mouth 2 (two) times daily as needed for moderate pain. 20 tablet 0  . Icosapent Ethyl (VASCEPA) 1 g CAPS Take 2 capsules (2 g total) by mouth 2 (two) times daily. 120 capsule 11  . ketoconazole (NIZORAL) 2 % cream Apply 1 application topically daily as needed (for dermatitits).     Marland Kitchen ketoconazole (NIZORAL) 2 % shampoo APP EXT TO THE SKIN 3 TIMES WEEKLY    . levothyroxine (SYNTHROID) 175 MCG tablet TAKE 1 TABLET(175 MCG) BY MOUTH DAILY BEFORE BREAKFAST 90 tablet 0  . LINZESS 290 MCG CAPS capsule TAKE 1 CAPSULE(290 MCG) BY MOUTH DAILY 30 MINUTES BEFORE FIRST MEAL OF THE DAY ON AN EMPTY STOMACH 90 capsule 1  . lisinopril (ZESTRIL) 10 MG tablet TAKE 1 TABLET(10 MG) BY MOUTH DAILY 90 tablet 0  . loratadine (CLARITIN) 10 MG tablet Take 1 tablet (10 mg total) by mouth daily. (Patient taking differently: Take 10 mg by mouth daily as needed. )    . Multiple Vitamins-Minerals (CENTRUM SILVER PO) Take 1 tablet by mouth daily.    . Multiple Vitamins-Minerals (PRESERVISION AREDS 2 PO) Take 1 tablet by mouth daily.    . nitroGLYCERIN (NITROSTAT) 0.4 MG SL tablet Place 1 tablet (0.4 mg total) under the tongue every 5 (five) minutes as needed for chest pain. 25 tablet 0  . potassium chloride (KLOR-CON) 10 MEQ tablet TAKE 1 TABLET(10 MEQ) BY MOUTH TWICE DAILY 180 tablet 1  . Psyllium (METAMUCIL FIBER) 51.7 % PACK Take 5-10 mLs by mouth daily.    . tamsulosin (FLOMAX) 0.4 MG CAPS capsule TAKE 2 CAPSULES(0.8 MG) BY MOUTH DAILY 180 capsule 0  . tiZANidine (ZANAFLEX) 4 MG tablet Take 1  tablet (4 mg total) by mouth 2 (two) times daily as needed for muscle spasms (sedation precautions). 30 tablet 0  . diphenhydramine-acetaminophen (TYLENOL PM EXTRA STRENGTH) 25-500 MG TABS tablet Take 1 tablet by mouth at bedtime as needed. (Patient taking differently: Take 2 tablets by mouth at bedtime. )     No facility-administered medications prior to visit.     Per HPI unless specifically indicated in ROS section below Review of Systems Objective:    BP 124/82 (BP Location: Left Arm, Patient Position: Sitting, Cuff Size: Large)  Pulse (!) 106   Temp 97.9 F (36.6 C) (Temporal)   Ht 5' 11.5" (1.816 m)   Wt 286 lb 5 oz (129.9 kg)   SpO2 96%   BMI 39.38 kg/m   Wt Readings from Last 3 Encounters:  11/06/19 286 lb 5 oz (129.9 kg)  10/08/19 288 lb 1 oz (130.7 kg)  07/04/19 290 lb 7 oz (131.7 kg)    Ht Readings from Last 3 Encounters:  11/06/19 5' 11.5" (1.816 m)  10/08/19 5\' 10"  (1.778 m)  07/04/19 5\' 10"  (1.778 m)   Physical Exam Vitals and nursing note reviewed.  Constitutional:      General: He is not in acute distress.    Appearance: Normal appearance. He is well-developed. He is obese. He is not ill-appearing.  HENT:     Head: Normocephalic and atraumatic.     Right Ear: Hearing, tympanic membrane, ear canal and external ear normal.     Left Ear: Hearing, tympanic membrane, ear canal and external ear normal.     Mouth/Throat:     Pharynx: Uvula midline.  Eyes:     General: No scleral icterus.    Extraocular Movements: Extraocular movements intact.     Conjunctiva/sclera: Conjunctivae normal.     Pupils: Pupils are equal, round, and reactive to light.  Cardiovascular:     Rate and Rhythm: Normal rate and regular rhythm.     Pulses: Normal pulses.          Radial pulses are 2+ on the right side and 2+ on the left side.     Heart sounds: Normal heart sounds. No murmur.  Pulmonary:     Effort: Pulmonary effort is normal. No respiratory distress.     Breath sounds:  Normal breath sounds. No wheezing, rhonchi or rales.  Abdominal:     General: Abdomen is flat. Bowel sounds are normal. There is no distension.     Palpations: Abdomen is soft. There is no mass.     Tenderness: There is no abdominal tenderness. There is no guarding or rebound.     Hernia: No hernia is present.  Musculoskeletal:        General: Normal range of motion.     Cervical back: Normal range of motion and neck supple.     Right lower leg: No edema.     Left lower leg: No edema.  Lymphadenopathy:     Cervical: No cervical adenopathy.  Skin:    General: Skin is warm and dry.     Findings: No rash.  Neurological:     General: No focal deficit present.     Mental Status: He is alert and oriented to person, place, and time.     Comments: CN grossly intact, station and gait intact  Psychiatric:        Mood and Affect: Mood normal.        Behavior: Behavior normal.        Thought Content: Thought content normal.        Judgment: Judgment normal.       Results for orders placed or performed in visit on 10/30/19  VITAMIN D 25 Hydroxy (Vit-D Deficiency, Fractures)  Result Value Ref Range   VITD 36.58 30.00 - 100.00 ng/mL  TSH  Result Value Ref Range   TSH 2.88 0.35 - 4.50 uIU/mL  Basic metabolic panel  Result Value Ref Range   Sodium 135 135 - 145 mEq/L   Potassium 4.5 3.5 - 5.1 mEq/L  Chloride 102 96 - 112 mEq/L   CO2 29 19 - 32 mEq/L   Glucose, Bld 111 (H) 70 - 99 mg/dL   BUN 28 (H) 6 - 23 mg/dL   Creatinine, Ser 1.56 (H) 0.40 - 1.50 mg/dL   GFR 42.82 (L) >60.00 mL/min   Calcium 10.4 8.4 - 10.5 mg/dL   Lab Results  Component Value Date   CHOL 102 10/06/2019   HDL 41 10/06/2019   LDLCALC 24 10/06/2019   LDLDIRECT 117.0 10/24/2018   TRIG 184 (H) 10/06/2019   CHOLHDL 2.5 10/06/2019    Assessment & Plan:  This visit occurred during the SARS-CoV-2 public health emergency.  Safety protocols were in place, including screening questions prior to the visit, additional  usage of staff PPE, and extensive cleaning of exam room while observing appropriate contact time as indicated for disinfecting solutions.   Problem List Items Addressed This Visit    Vitamin D deficiency    Levels stable on replacement.       Spinal stenosis   Severe obesity (BMI 35.0-39.9) with comorbidity ([place])    Off bariatric medication.  Mild weight loss noted. Encouraged healthy diet and activity - keeping in mind chronic pain limitations      S/P insertion of spinal cord stimulator   Recurrent major depression in remission ([place])    Stable off medication. Will start trazodone for sleep.       Relevant Medications   traZODone (DESYREL) 50 MG tablet   OSA on CPAP   Hypothyroidism    Chronic, well controlled on levothyroxine 159mcg daily      Hypertension    Chronic, stable. Continue current regimen .      Hypercholesterolemia with hypertriglyceridemia    H/o statin intolerance.  He is on zetia, fibrate, vascepa and now praluent. The ASCVD Risk score Mikey Bussing DC Jr., et al., 2013) failed to calculate for the following reasons:   The 2013 ASCVD risk score is only valid for ages 66 to 26       Encounter for chronic pain management    [place] CSRS reviewed.  He states he has been taking 2 hydrocodone 5/325mg  whenever he takes pain medication.  He is hopeful to receive better pain relief as spinal stimulator continues to be adjusted.  Recommend continue gabapentin 300mg  nightly, start scheduled tylenol BID for pain relief, and use hydrocodone for breakthrough pain. I will Rx hydrocodone/APAP 10/325mg  at next refill so he only takes 1 tablet at a time.  RTC 3 mo f/u visit.       Diastolic heart failure (HCC)    Seem euvolemic.       CKD (chronic kidney disease) stage 3, GFR 30-59 ml/min    Recent deterioration - encouraged improved hydration status. Avoid NSAIDs      Chronic lower back pain    Persistent chronic lower back pain s/p spine stimulator. He is hopeful to get  better pain control as stimulator settings are adjusted over time. Reviewed current pain regimen - see below.       Relevant Medications   acetaminophen (TYLENOL) 500 MG tablet   Chronic insomnia    Finds lunesta 3mg  effective for sleep, but overly sedating the next day. He has stopped tylenol PM. Would like to try trazodone or doxepin. Will Rx trazodone 25-50mg  nightly PRN insomnia.       Chronic constipation    Well managed with linzess and colace.       Advanced care planning/counseling discussion - Primary  Advanced directive discussion -sons would be HCPOA. Doesn't have living will. Packet provided today       Abdominal aortic aneurysm (AAA) without rupture ([place])    Will need to verify this is being monitored          Meds ordered this encounter  Medications  . acetaminophen (TYLENOL) 500 MG tablet    Sig: Take 1 tablet (500 mg total) by mouth 2 (two) times daily as needed for moderate pain.  . traZODone (DESYREL) 50 MG tablet    Sig: Take 0.5-1 tablets (25-50 mg total) by mouth at bedtime as needed for sleep.    Dispense:  30 tablet    Refill:  3   No orders of the defined types were placed in this encounter.   Patient instructions: In place of lunesta try trazodone for sleep, let me know effect.  Advanced directive packet provided today.  Schedule eye exam.  We will increase hydrocodone to 10/325mg  next refill - continue #20 per fill. Continue gabapentin 600mg  at night time. Ok to take plain tylenol extra strength 500mg  1 at a time for pain - would start with this first when you're hurting before the hydrocodone. Let me know how you do with this.  Return in 3 months for follow up visit.   Follow up plan: Return in about 3 months (around 02/06/2020) for follow up visit.  Ria Bush, MD

## 2019-11-06 NOTE — Telephone Encounter (Signed)
Pt called to talk with Lattie Haw. He is requesting a call back to discuss a few questions he has.

## 2019-11-06 NOTE — Telephone Encounter (Signed)
Pt reports Dr. Darnell Level listened to the left side of his chest where he had the stent put in longer than any other area with the stethoscope. He wants to know if there is something wrong there. Advised what provider would be listening too but he wants a msg sent to PCP to confirm nothing is wrong. Advised a msg would be sent. Pt verbalized understanding and was appreciative

## 2019-11-06 NOTE — Patient Instructions (Addendum)
In place of lunesta try trazodone for sleep, let me know effect.  Advanced directive packet provided today.  Schedule eye exam.  We will increase hydrocodone to 10/325mg  next refill - continue #20 per fill. Continue gabapentin 600mg  at night time. Ok to take plain tylenol extra strength 500mg  1 at a time for pain - would start with this first when you're hurting before the hydrocodone. Let me know how you do with this.  Return in 3 months for follow up visit.   Health Maintenance After Age 4 After age 70, you are at a higher risk for certain long-term diseases and infections as well as injuries from falls. Falls are a major cause of broken bones and head injuries in people who are older than age 58. Getting regular preventive care can help to keep you healthy and well. Preventive care includes getting regular testing and making lifestyle changes as recommended by your health care provider. Talk with your health care provider about:  Which screenings and tests you should have. A screening is a test that checks for a disease when you have no symptoms.  A diet and exercise plan that is right for you. What should I know about screenings and tests to prevent falls? Screening and testing are the best ways to find a health problem early. Early diagnosis and treatment give you the best chance of managing medical conditions that are common after age 37. Certain conditions and lifestyle choices may make you more likely to have a fall. Your health care provider may recommend:  Regular vision checks. Poor vision and conditions such as cataracts can make you more likely to have a fall. If you wear glasses, make sure to get your prescription updated if your vision changes.  Medicine review. Work with your health care provider to regularly review all of the medicines you are taking, including over-the-counter medicines. Ask your health care provider about any side effects that may make you more likely to have a  fall. Tell your health care provider if any medicines that you take make you feel dizzy or sleepy.  Osteoporosis screening. Osteoporosis is a condition that causes the bones to get weaker. This can make the bones weak and cause them to break more easily.  Blood pressure screening. Blood pressure changes and medicines to control blood pressure can make you feel dizzy.  Strength and balance checks. Your health care provider may recommend certain tests to check your strength and balance while standing, walking, or changing positions.  Foot health exam. Foot pain and numbness, as well as not wearing proper footwear, can make you more likely to have a fall.  Depression screening. You may be more likely to have a fall if you have a fear of falling, feel emotionally low, or feel unable to do activities that you used to do.  Alcohol use screening. Using too much alcohol can affect your balance and may make you more likely to have a fall. What actions can I take to lower my risk of falls? General instructions  Talk with your health care provider about your risks for falling. Tell your health care provider if: ? You fall. Be sure to tell your health care provider about all falls, even ones that seem minor. ? You feel dizzy, sleepy, or off-balance.  Take over-the-counter and prescription medicines only as told by your health care provider. These include any supplements.  Eat a healthy diet and maintain a healthy weight. A healthy diet includes low-fat dairy products,  low-fat (lean) meats, and fiber from whole grains, beans, and lots of fruits and vegetables. Home safety  Remove any tripping hazards, such as rugs, cords, and clutter.  Install safety equipment such as grab bars in bathrooms and safety rails on stairs.  Keep rooms and walkways well-lit. Activity   Follow a regular exercise program to stay fit. This will help you maintain your balance. Ask your health care provider what types of  exercise are appropriate for you.  If you need a cane or walker, use it as recommended by your health care provider.  Wear supportive shoes that have nonskid soles. Lifestyle  Do not drink alcohol if your health care provider tells you not to drink.  If you drink alcohol, limit how much you have: ? 0-1 drink a day for women. ? 0-2 drinks a day for men.  Be aware of how much alcohol is in your drink. In the [place] one drink equals one typical bottle of beer (12 oz), one-half glass of wine (5 oz), or one shot of hard liquor (1 oz).  Do not use any products that contain nicotine or tobacco, such as cigarettes and e-cigarettes. If you need help quitting, ask your health care provider. Summary  Having a healthy lifestyle and getting preventive care can help to protect your health and wellness after age 77.  Screening and testing are the best way to find a health problem early and help you avoid having a fall. Early diagnosis and treatment give you the best chance for managing medical conditions that are more common for people who are older than age 10.  Falls are a major cause of broken bones and head injuries in people who are older than age 47. Take precautions to prevent a fall at home.  Work with your health care provider to learn what changes you can make to improve your health and wellness and to prevent falls. This information is not intended to replace advice given to you by your health care provider. Make sure you discuss any questions you have with your health care provider. Document Revised: 12/05/2018 Document Reviewed: 06/27/2017 Elsevier Patient Education  2020 [employer].

## 2019-11-07 NOTE — Telephone Encounter (Signed)
Heart and lungs sounded good.  Hope he has a good weekend.

## 2019-11-07 NOTE — Telephone Encounter (Signed)
Spoke with pt relaying Dr. G's message.  Verbalizes understanding.  

## 2019-11-11 ENCOUNTER — Other Ambulatory Visit: Payer: Self-pay

## 2019-11-11 ENCOUNTER — Encounter: Payer: Self-pay | Admitting: Family Medicine

## 2019-11-11 ENCOUNTER — Ambulatory Visit: Payer: Medicare Other

## 2019-11-11 DIAGNOSIS — K5909 Other constipation: Secondary | ICD-10-CM

## 2019-11-11 DIAGNOSIS — F5104 Psychophysiologic insomnia: Secondary | ICD-10-CM

## 2019-11-11 DIAGNOSIS — N400 Enlarged prostate without lower urinary tract symptoms: Secondary | ICD-10-CM

## 2019-11-11 DIAGNOSIS — M1990 Unspecified osteoarthritis, unspecified site: Secondary | ICD-10-CM

## 2019-11-11 DIAGNOSIS — G8929 Other chronic pain: Secondary | ICD-10-CM

## 2019-11-11 NOTE — Chronic Care Management (AMB) (Signed)
Chronic Care Management Pharmacy  Name: Anthony [name]  MRN: [id number] DOB: [birth year]  Chief Complaint/ HPI  Anthony [name],  84 y.o., male presents for their Follow-Up CCM visit with the clinical pharmacist via telephone.  PCP : Anthony Bush, MD  Their chronic conditions addresses today include: major depressive disorder, chronic pain, BPH  Patient concerns: wanted to review labs, answered questions regarding recent lab work   Med changes per consult with PCP since last visit: Start gabapentin 300 mg qhs then increase to 300 mg BID. Start trazodone 50 mg qhs for sleep.   Current Diagnosis/Assessment: Goals    . Increase physical activity     Starting 10/24/2018, I will continue to exercise for at least 45 minutes daily.     . Patient Stated     10/30/2019, I will continue to walk for 1 hour everyday.     Marland Kitchen Pharmacy Care Plan     Current Barriers:  . Chronic Disease Management support, education, and care coordination needs related to hypercholesterolemia, major depressive disorder, constipation, chronic pain, history of atrial flutter, coronary artery disease (stent placed 2013), hypertension, diastolic heart failure, hypothyroidism, sleep apnea, essential tremor, CKD, BPH, vitamin D deficiency, seasonal allergies  Pharmacist Clinical Goal(s):  Marland Kitchen Improve neuropathic pain control. Continue gabapentin 600 mg at bedtime in addition to Tylenol 500 mg twice daily and hydrocodone for severe pain.   . Improve time to sleep and sleep maintenance. Continue trazodone 50 mg 1 tablet at bedtime. May discontinue melatonin if not beneficial.  Recommend limiting time on screens several hours before bed.  . Remain up to date on vaccinations. Recommend new shingles vaccines (Shingrix).   Interventions: . Comprehensive medication review performed.  Patient Self Care Activities:  . Self administers medications as prescribed  Initial goal documentation      BPH   Patient has failed  these meds in past: none Patient is currently controlled on the following medications:   Tamsulosin 0.4 mg - 2 capsule daily (PM) - not taking   Finasteride 5 mg - 1 tablet daily  We discussed: reports symptoms stable; stopped tamsulosin since last CCM visit on 3/16 because he thought it was making him drowsy, feels fine without it   Plan: Continue current medications; Contact clinic if symptoms worsen.   Neuropathy/Chronic pain  Symptoms: nerve pain down leg to foot, dull and aching back pain Patient has failed these meds in past: tramadol - not effective  Patient is currently uncontrolled on the following medications:   Gabapentin 300 mg - take 2 capsules at bedtime   Hydrocodone/acetaminophen 5-325 mg - 1 tablet BID PRN  Tizanidine 4 mg - 1 tablet BID PRN (rarely uses)  Tylenol 500 mg - 1 tablets twice daily   We discussed: Pt has started gabapentin and was able to increase to 2 tablets qhs without any adverse effects, denies much difference in nerve pain, but reports he is not taking his Norco anymore, so may be providing some benefit. Not taking tizanidine because he does not find it effective. Asked if pt would be comfortable increasing gabapentin to 1 tablet AM and 2 tablets at bedtime, but he prefers not to take it during the day due to concern for drowsiness when driving.   Bowel regimen:   Linzess 290 mcg - 1 capsule daily 30 minutes before first meal of the day   Docusate 100 mg - 1 tablet BID We discussed: fairly well controlled on current therapy  Plan: Continue current  medications  Insomnia    Patient has failed these meds in past: reports several    Patient is currently controlled on the following medications:   Tylenol PM extra strength 25-500 mg - 2 tablet daily at bedtime PRN sleep  (Lunesta) Eszopiclone 3 mg - 1 tablet at bedtime PRN (not taking)  Trazodone 50 mg - 1 tablet qhs PRN   Melatonin 10 mg - qhs   We discussed: Main complaint is daytime  fatigue and wide awake at night. Pt stopped taking Tylenol PM and tried trazodone 1/2 tablet for 1 night, then 1 whole tablet the next night. He was not satisfied with duration of sleep or time to sleep onset so discontinued trazodone and resumed Tylenol PM. Not taking Lunesta due to next day drowsiness. Continues melatonin 10 mg qh, although not helping --> recommended sleep hygiene including avoiding screens several hours before bed and trying trazodone for several more nights, may discontinue melatonin if not helpful. Avoid combining Tylenol PM and trazodone.   Sleep hygiene:   Takes meds at 9 PM, gets into bed at midnight  Plays solitaire on computer or watches TV from 9 PM to 12 AM   Denies taking naps  Plan: Continue current medications. Continue trazodone and let me know if sleep improves. Recommend limiting nighttime screen time.   Medication Management  Pharmacy: [facility], Part D: [employer]  --> son organizes patient's medications - fills pillbox every 2 weeks  Adherence: out of refills on fenofibrate  Affordability: referred to [facility] for medicare comparisons; getting Praluent through assistance   Takes all meds at either BF, bedtime   Vaccines: Recommend Shingrix  CCM Follow Up: 12/15/19 at 1:30 PM (telephone)  Debbora Dus, PharmD Clinical Pharmacist [facility] at [facility] [phone]

## 2019-11-11 NOTE — Assessment & Plan Note (Addendum)
[place] CSRS reviewed.  He states he has been taking 2 hydrocodone 5/325mg  whenever he takes pain medication.  He is hopeful to receive better pain relief as spinal stimulator continues to be adjusted.  Recommend continue gabapentin 600mg  nightly, start scheduled tylenol BID for pain relief, and use hydrocodone for breakthrough pain. I will Rx hydrocodone/APAP 10/325mg  at next refill so he only takes 1 tablet at a time.  RTC 3 mo f/u visit.

## 2019-11-11 NOTE — Assessment & Plan Note (Signed)
Chronic, stable. Continue current regimen. 

## 2019-11-11 NOTE — Assessment & Plan Note (Signed)
Finds lunesta 3mg  effective for sleep, but overly sedating the next day. He has stopped tylenol PM. Would like to try trazodone or doxepin. Will Rx trazodone 25-50mg  nightly PRN insomnia.

## 2019-11-11 NOTE — Assessment & Plan Note (Signed)
Advanced directive discussion -sons would be HCPOA. Doesn't have living will. Packet provided today

## 2019-11-11 NOTE — Assessment & Plan Note (Signed)
Will need to verify this is being monitored

## 2019-11-11 NOTE — Assessment & Plan Note (Addendum)
Recent deterioration - encouraged improved hydration status. Avoid NSAIDs

## 2019-11-11 NOTE — Assessment & Plan Note (Signed)
Seem euvolemic.

## 2019-11-11 NOTE — Assessment & Plan Note (Signed)
Stable off medication. Will start trazodone for sleep.

## 2019-11-11 NOTE — Assessment & Plan Note (Addendum)
Off bariatric medication.  Mild weight loss noted. Encouraged healthy diet and activity - keeping in mind chronic pain limitations

## 2019-11-11 NOTE — Assessment & Plan Note (Addendum)
H/o statin intolerance.  He is on zetia, fibrate, vascepa and now praluent. The ASCVD Risk score Anthony Bussing DC Jr., et al., 2013) failed to calculate for the following reasons:   The 2013 ASCVD risk score is only valid for ages 77 to 29

## 2019-11-11 NOTE — Assessment & Plan Note (Signed)
Levels stable on replacement.

## 2019-11-11 NOTE — Assessment & Plan Note (Addendum)
Persistent chronic lower back pain s/p spine stimulator. He is hopeful to get better pain control as stimulator settings are adjusted over time. Reviewed current pain regimen - see below.

## 2019-11-11 NOTE — Assessment & Plan Note (Signed)
Well managed with linzess and colace.

## 2019-11-11 NOTE — Assessment & Plan Note (Signed)
Chronic, well controlled on levothyroxine 11mcg daily

## 2019-11-13 NOTE — Patient Instructions (Addendum)
Dear Anthony [name],  Below is a summary of the goals we discussed during our telephone call on 11/11/2019. Please contact me anytime with questions or concerns.   Visit Information  Goals Addressed            This Visit's Progress   . Pharmacy Care Plan       Current Barriers:  . Chronic Disease Management support, education, and care coordination needs related to hypercholesterolemia, major depressive disorder, constipation, chronic pain, history of atrial flutter, coronary artery disease (stent placed 2013), hypertension, diastolic heart failure, hypothyroidism, sleep apnea, essential tremor, CKD, BPH, vitamin D deficiency, seasonal allergies  Pharmacist Clinical Goal(s):  Marland Kitchen Improve neuropathic pain control. Continue gabapentin 600 mg at bedtime in addition to Tylenol 500 mg twice daily and hydrocodone for severe pain.   . Improve time to sleep and sleep maintenance. Continue trazodone 50 mg 1 tablet at bedtime. May discontinue melatonin if not beneficial.  Recommend limiting time on screens several hours before bed.  . Remain up to date on vaccinations. Recommend new shingles vaccines (Shingrix).   Interventions: . Comprehensive medication review performed.  Patient Self Care Activities:  . Self administers medications as prescribed  Initial goal documentation      The patient verbalized understanding of instructions provided today and agreed to receive a mailed copy of patient instruction and/or educational materials. Telephone follow up appointment with pharmacy team member scheduled for: 12/15/19 at 1:30 PM (telephone)  Debbora Dus, PharmD Clinical Pharmacist [facility] at [facility] [phone]

## 2019-11-27 ENCOUNTER — Ambulatory Visit: Payer: Self-pay

## 2019-11-27 ENCOUNTER — Telehealth: Payer: Self-pay | Admitting: Family Medicine

## 2019-11-27 DIAGNOSIS — G250 Essential tremor: Secondary | ICD-10-CM

## 2019-11-27 DIAGNOSIS — M25552 Pain in left hip: Secondary | ICD-10-CM

## 2019-11-27 DIAGNOSIS — M25551 Pain in right hip: Secondary | ICD-10-CM

## 2019-11-27 DIAGNOSIS — F5104 Psychophysiologic insomnia: Secondary | ICD-10-CM

## 2019-11-27 NOTE — Chronic Care Management (AMB) (Signed)
Chronic Care Management Pharmacy  Name: MICKEY NELAN  MRN: [id number] DOB: Sep 01, 1935  Chief Complaint/ HPI  Anthony [name],  84 y.o., male contacted pharmacist with medications questions/concerns.  PCP : Ria Bush, MD  Their chronic conditions addresses today include: chronic pain, neuropathy, BPH, insomnia  Patient concerns: patient called to express concern about tremor in both hands for the past 6-12 months, unable to write, interested in medication - discussed gabapentin may benefit tremors and might could increase dose if needed; also interested in restarting weight loss medication  Current Diagnosis/Assessment: Goals    . Increase physical activity     Starting 10/24/2018, I will continue to exercise for at least 45 minutes daily.     . Patient Stated     10/30/2019, I will continue to walk for 1 hour everyday.     Marland Kitchen Pharmacy Care Plan     Current Barriers:  . Chronic Disease Management support, education, and care coordination needs related to hypercholesterolemia, major depressive disorder, constipation, chronic pain, history of atrial flutter, coronary artery disease (stent placed 2013), hypertension, diastolic heart failure, hypothyroidism, sleep apnea, essential tremor, CKD, BPH, vitamin D deficiency, seasonal allergies  Pharmacist Clinical Goal(s):  Marland Kitchen Improve neuropathic pain control. Continue gabapentin 600 mg at bedtime in addition to Tylenol 500 mg twice daily and hydrocodone for severe pain.   . Improve time to sleep and sleep maintenance. Continue trazodone 50 mg 1 tablet at bedtime. May discontinue melatonin if not beneficial.  Recommend limiting time on screens several hours before bed.  . Remain up to date on vaccinations. Recommend new shingles vaccines (Shingrix).   Interventions: . Comprehensive medication review performed.  Patient Self Care Activities:  . Self administers medications as prescribed  Initial goal documentation       Diet/Weight Loss   Patient has failed these meds in past: naltrexone/bupropion Patient is currently on the following medications:   No pharmacotherapy  We discussed: Patient reports diet and exercise have not changed since he tried naltrexone and bupropion last summer (2020). We discussed unlikely to be efficacious without significant change as he took the medication for > 12 weeks last summer and did not achieve > 5% weight loss. Pt would still like to retry or consider alternatives.   Exercise --> walks at the mall every day for 1 hour Diet -->  Breakfast: peanut butter puffs, skim milk  Snack: drive thru sausage scrambled egg biscuit at 10:30 daily  Lunch: Skips  Dinner: [employer] - grillled fish/chicken, brocolli, green beans; does not eat biscuits  Drinks: unsweet tea, occasional soda, does not drink water  Current weight: 283 lbs Goal weight: 200 lbs  Plan: Consult with PCP regarding weight loss medication. Recommend improving breakfast and lunch options by choosing items with higher fiber and protein and less added sugars and carbohydrates.   BPH   Patient has failed these meds in past: none Patient is currently controlled on the following medications:   Tamsulosin 0.4 mg - 2 capsule daily (PM) - not taking   Finasteride 5 mg - 1 tablet daily  We discussed: reports symptoms stable; stopped tamsulosin on his own early March because he thought it was making him drowsy/dizzy, reports still doing well without it   Plan: Continue current medications; Contact clinic if symptoms worsen.   Neuropathy/Chronic pain   Symptoms: nerve pain down leg to foot, dull and aching back pain Patient has failed these meds in past: tramadol - not effective, tizanidine - not  effective Patient is currently controlled on the following medications:   Gabapentin 300 mg - take 2 capsules at bedtime   Hydrocodone/acetaminophen 5-325 mg - 1 tablet BID PRN >> not taking currently   Tylenol 500 mg - 1 tablets twice daily   CrCl: 53 ml/min (Scr: 1.56 10/30/19, adjusted body weight:102 kg) We discussed: patient reports improvement in neuropathy and sleep with gabapentin, prefers to only take at bedtime due to daytime drowsiness   Bowel regimen:   Linzess 290 mcg - 1 capsule daily 30 minutes before first meal of the day   Docusate 100 mg - 1 tablet BID  Plan: Continue current medications; Request gabapentin refill.   Insomnia    Patient has failed these meds in past: Lunesta - grogginess, trazodone  Patient is currently controlled on the following medications:   Tylenol PM extra strength 25-500 mg - 2 tablet daily at bedtime PRN sleep >> has reduced to every other night  Trazodone 50 mg - 1 tablet qhs PRN >> reports ineffective   Melatonin 10 mg - qhs   We discussed: Patient tried trazodone for 3-4 days and denies efficacy, however, reports he is sleeping much better since restarting gabapentin at higher dose for neuropathy. He has reduced his Tylenol PM use to every other night.  Sleep hygiene (no change):   Takes meds at 9 PM, gets into bed at midnight  Plays solitaire on computer or watches TV from 9 PM to 12 AM   Denies taking naps  Plan: Continue current medications; Will removed trazodone from medication list.  Medication Management  Ph[facility]acy: [facility], [facility]: [employer]  --> son organizes patient's medications - fills pillbox every 2 weeks  Adherence: out of refills on fenofibrate --> reports stopped this medication altogether  Affordability: referred to [facility] for medicare comparisons; Praluent through assistance   Takes all meds at either BF, bedtime   Vaccines: Recommend Shingrix  CCM Follow Up: 01/16/20 at 9:30 PM (telephone)  Debbora Dus, PharmD Clinical Pharmacist [facility] at [facility] [phone]

## 2019-11-27 NOTE — Telephone Encounter (Signed)
Patient called today asking for you   He is requesting a call back when you have a chance

## 2019-11-28 NOTE — Telephone Encounter (Signed)
Returned patient call, we discussed the following patient concerns:   Would like a refill on gabapentin - taking 2 capsules daily at bedtime. His neuropathy and sleep have improved with higher dose. He is only taking at bedtime as it makes him drowsy.    Interested in medication for essential tremor. Reports affecting both hands and worsening for the past 6 months, impairs writing/daily activities.    Interested in restarting weight loss medication (naltrexone and bupropion). Previously on: wellbutrin 100mg  bid and naltrexone 50mg  1/2 tab BID (Contrave too expensive), Alli ineffective  Current Diet:  Breakfast: peanut butter puffs cereal with skim milk  Lunch: sausage and egg biscuit ([place])  Dinner: grilled chicken or fish with green beans and broccoli ([employer])  Drinks: unsweet tea, occasional soda  Current weight: 283 lbs; Goal weight: 200 lbs  Discussed improving breakfast and lunch options. He is walking at least 1 hour daily. He is still interested in restarting previous weight loss medication although we discussed < 5% weight loss over 12 week period with previous trial, unlikely to be effective for him. He is open to alternatives.  Refer to PCP for treatment recommendations or further evaluation. Let me know if needs to schedule an office visit.  Debbora Dus, PharmD Clinical Pharmacist [facility] at [facility] [phone]

## 2019-11-29 MED ORDER — GABAPENTIN 300 MG PO CAPS: 600.0000 mg | ORAL_CAPSULE | Freq: Every day | ORAL | 1 refills | Status: DC

## 2019-11-29 MED ORDER — NALTREXONE HCL 50 MG PO TABS: 25.0000 mg | ORAL_TABLET | Freq: Every day | ORAL | 1 refills | Status: DC

## 2019-11-29 MED ORDER — BUPROPION HCL ER (SR) 100 MG PO TB12: 100.0000 mg | ORAL_TABLET | Freq: Every day | ORAL | 1 refills | Status: DC

## 2019-11-29 NOTE — Addendum Note (Signed)
Addended by: Ria Bush on: 11/29/2019 11:32 AM   Modules accepted: Orders

## 2019-11-29 NOTE — Telephone Encounter (Signed)
Ok I have refilled gabapentin 600mg  at night.   Has gabapentin helped tremor at all? If he only takes 1 gabapentin 300mg  at a time does that make him drowsy? Could add a 300mg  tab mid day to see if tremor improves.   Has trazodone helped sleep? If sleeping well with gabapentin 600mg  at night, could try off trazodone.   Ok I have refilled the wellbutrin SR 100mg  to take once daily in the morning along with naltrexone 50mg  1/2 tablet daily.  NOTE ==> he should be aware that if he takes daily for weight loss effect, the naltrexone will negate his hydrocodone pain relief effect.   Please schedule 1 month f/u visit after starting wellbutrin/naltrexone, will also further evaluate tremor at that time.

## 2019-12-01 NOTE — Telephone Encounter (Signed)
Attempted to contact pt.  No answer.  Vm box is full.  Need to relay Dr. Synthia Innocent message and schedule 1 mth f/u OV for Wellbutrin and naltrexone.

## 2019-12-02 NOTE — Telephone Encounter (Signed)
Attempted to contact pt.  No answer.  Vm box is full.  Need to relay Dr. Synthia Innocent message and schedule 1 mth f/u OV for Wellbutrin and naltrexone.

## 2019-12-02 NOTE — Telephone Encounter (Signed)
Pt returning call.  I relayed Dr. Synthia Innocent message.  Pt verbalizes understanding.  States 1 gabapentin 300 mg did not make him drowsy but he usually would take 2 tabs.  Says it helps with the tremors.  As for the trazodone, states he had already stopped it.  Scheduled 1 mo wt mngmt f/u on 12/31/19 at 1:00.  FYI to Dr. Darnell Level.

## 2019-12-08 ENCOUNTER — Telehealth: Payer: Self-pay | Admitting: Family Medicine

## 2019-12-08 NOTE — Telephone Encounter (Signed)
Patient called into the office.  He was trying to get a hold of you but the number we both have will not work  Patient would like a call back

## 2019-12-09 ENCOUNTER — Telehealth: Payer: Self-pay

## 2019-12-09 MED ORDER — BUPROPION HCL ER (SR) 100 MG PO TB12: 100.0000 mg | ORAL_TABLET | Freq: Two times a day (BID) | ORAL | 1 refills | Status: DC

## 2019-12-09 NOTE — Telephone Encounter (Cosign Needed)
Anthony [name] contacted me by telephone today. He reports doing well on weight loss medications naltrexone and bupropion for the first several days, but has noticed his appetite returning to baseline. We discussed expectations and goal of > 5% weight loss (~15 lbs) by 12 weeks. Continues to exercise daily. His pre-treatment weight is 283 lbs. He would like to increase his dose.   We discussed that he is on maintenance dose of naltrexone but we could consider increasing bupropion. Contrave dosing recommends increasing bupropion to 180 mg daily after 1 week with a with max dose 8/90 mg BID in mild-moderate renal impairment. Bupropion monotherapy max dose is considered 150 mg/day with CrCl 15-60 ml/min.   CrCl: 53 ml/min (Scr: 1.56 10/30/19, adjusted body weight:102 kg)  Would you recommend any dose changes?  Debbora Dus, PharmD Clinical Pharmacist [facility] at [facility] [phone]'

## 2019-12-09 NOTE — Telephone Encounter (Signed)
Let's increase bupropion to 100mg  SR BID.  This is likely highest I would go  New sig sent to pharmacy.

## 2019-12-15 ENCOUNTER — Telehealth: Payer: Medicare Other

## 2019-12-27 ENCOUNTER — Other Ambulatory Visit: Payer: Self-pay | Admitting: Family Medicine

## 2019-12-27 NOTE — Telephone Encounter (Signed)
Last filled on 11/05/2018 #135 with 3 refills  LOV 11/06/19 CPE  Next appointment on 12/31/19

## 2019-12-30 ENCOUNTER — Telehealth: Payer: Self-pay

## 2019-12-30 MED ORDER — HYDROCODONE-ACETAMINOPHEN 10-325 MG PO TABS: 1.0000 | ORAL_TABLET | Freq: Three times a day (TID) | ORAL | 0 refills | Status: DC | PRN

## 2019-12-30 NOTE — Telephone Encounter (Signed)
As per previous discussion, I have sent in hydrocodone APAP 10/325mg  (higher dose than prior) to take 1 tab PRN breakthrough pain. Ensure not to take with the naltrexone.

## 2019-12-30 NOTE — Telephone Encounter (Signed)
Pt called to reuqest refill for hydrocodone/acetaminophen.

## 2019-12-31 ENCOUNTER — Encounter: Payer: Self-pay | Admitting: Family Medicine

## 2019-12-31 ENCOUNTER — Other Ambulatory Visit: Payer: Self-pay

## 2019-12-31 ENCOUNTER — Ambulatory Visit (INDEPENDENT_AMBULATORY_CARE_PROVIDER_SITE_OTHER): Payer: Medicare Other | Admitting: Family Medicine

## 2019-12-31 DIAGNOSIS — N1832 Chronic kidney disease, stage 3b: Secondary | ICD-10-CM | POA: Diagnosis not present

## 2019-12-31 DIAGNOSIS — I10 Essential (primary) hypertension: Secondary | ICD-10-CM

## 2019-12-31 DIAGNOSIS — M545 Low back pain: Secondary | ICD-10-CM | POA: Diagnosis not present

## 2019-12-31 DIAGNOSIS — Z6841 Body Mass Index (BMI) 40.0 and over, adult: Secondary | ICD-10-CM | POA: Diagnosis not present

## 2019-12-31 DIAGNOSIS — M159 Polyosteoarthritis, unspecified: Secondary | ICD-10-CM

## 2019-12-31 DIAGNOSIS — G250 Essential tremor: Secondary | ICD-10-CM

## 2019-12-31 DIAGNOSIS — G8929 Other chronic pain: Secondary | ICD-10-CM

## 2019-12-31 DIAGNOSIS — I2510 Atherosclerotic heart disease of native coronary artery without angina pectoris: Secondary | ICD-10-CM | POA: Diagnosis not present

## 2019-12-31 DIAGNOSIS — M8949 Other hypertrophic osteoarthropathy, multiple sites: Secondary | ICD-10-CM

## 2019-12-31 MED ORDER — BUPROPION HCL ER (SR) 100 MG PO TB12: 100.0000 mg | ORAL_TABLET | Freq: Every day | ORAL | 1 refills | Status: DC

## 2019-12-31 NOTE — Progress Notes (Signed)
This visit was conducted in person.  BP (!) 162/116 (BP Location: Right Arm, Cuff Size: Large)   Pulse 98   Temp 97.8 F (36.6 C) (Temporal)   Ht 5' 11.5" (1.816 m)   Wt 291 lb 5 oz (132.1 kg)   SpO2 95%   BMI 40.06 kg/m   BP Readings from Last 3 Encounters:  12/31/19 (!) 162/116  11/06/19 124/82  10/08/19 122/82    CC: weight management visit, chronic pain f/u visit Subjective:    Patient ID: Anthony [name], male    DOB: 04/23/1936, 84 y.o.   MRN: [id number]  HPI: Anthony [name] is a 84 y.o. male presenting on 12/31/2019 for Weight Management (Here for f/u.)   HTN - may have forgotten BP meds this morning. On lisinopril 10mg  daliy. Takes lasix 40mg  daily with extra PRN.   5lb weight gain noted since last visit, 8 lb weight gain noted total since starting bariatric medications of naltrexone 50mg  1/2 tab daily and bupropion 100mg  SR bid. He hasn't noticed much change in appetite or cravings. Bariatric meds started 11/27/2019. Larger portion sizes - found that when he was eating smaller portions didn't feel well.   Diet:  2 full meals a day (9am and 4pm).  Some snacking between meals. Avoids deserts.  Drinking water or soda (1-2 times per week).  Activity limited by chronic pain.  Has not seen nutritionist. Declines referral at this time.  Considering signing up for home delivery meal system.   Chronic lower back pain with R radiculopathy, known spinal stenosis. Hydrocodone 10/325mg  refilled yesterday (higher dose than previously). He also continues gabapentin 600mg  at bedtime, zanaflex 4mg  BID PRN (rare use) and tylenol 500mg  BID. He has spinal stimulator in place. Concerned for higher gabapentin dose due to daytime somnolence.   Gabapentin didn't really help his tremor.     Relevant past medical, surgical, family and social history reviewed and updated as indicated. Interim medical history since our last visit reviewed. Allergies and medications reviewed and  updated. Outpatient Medications Prior to Visit  Medication Sig Dispense Refill  . acetaminophen (TYLENOL) 500 MG tablet Take 1 tablet (500 mg total) by mouth 2 (two) times daily as needed for moderate pain.    . Alirocumab (PRALUENT) 75 MG/ML SOAJ Inject 1 pen into the skin every 14 (fourteen) days. 2 pen 11  . aspirin EC 81 MG tablet Take 1 tablet (81 mg total) by mouth daily.    . Azelastine HCl 0.15 % SOLN U 1 TO 2 SPRAYS IEN BID    . benzonatate (TESSALON) 100 MG capsule Take 1 capsule (100 mg total) by mouth 3 (three) times daily as needed for cough. 30 capsule 0  . Cholecalciferol (VITAMIN D3) 1000 units CAPS Take 1 capsule (1,000 Units total) by mouth daily. 30 capsule   . docusate sodium (COLACE) 100 MG capsule Take 100 mg by mouth 2 (two) times daily.    . Eszopiclone 3 MG TABS Take 1 tablet (3 mg total) by mouth at bedtime as needed. Take immediately before bedtime 30 tablet 0  . ezetimibe (ZETIA) 10 MG tablet TAKE 1 TABLET(10 MG) BY MOUTH DAILY 90 tablet 3  . fenofibrate 160 MG tablet TAKE 1 TABLET BY MOUTH EVERY DAY 90 tablet 0  . finasteride (PROSCAR) 5 MG tablet TAKE 1 TABLET(5 MG) BY MOUTH DAILY 90 tablet 0  . furosemide (LASIX) 40 MG tablet TAKE 1 TABLET BY MOUTH EVERY DAY. TAKE AN EXTRA TABLET AS NEEDED FOR  WEIGHT GAIN OF 3 LBS/DAY OR 5LBS/WEEK 135 tablet 3  . gabapentin (NEURONTIN) 300 MG capsule Take 2 capsules (600 mg total) by mouth at bedtime. With extra tablet during the day as needed 200 capsule 1  . HYDROcodone-acetaminophen (NORCO) 10-325 MG tablet Take 1 tablet by mouth every 8 (eight) hours as needed. 20 tablet 0  . Icosapent Ethyl (VASCEPA) 1 g CAPS Take 2 capsules (2 g total) by mouth 2 (two) times daily. 120 capsule 11  . ketoconazole (NIZORAL) 2 % cream Apply 1 application topically daily as needed (for dermatitits).     Marland Kitchen ketoconazole (NIZORAL) 2 % shampoo APP EXT TO THE SKIN 3 TIMES WEEKLY    . levothyroxine (SYNTHROID) 175 MCG tablet TAKE 1 TABLET(175 MCG) BY  MOUTH DAILY BEFORE BREAKFAST 90 tablet 0  . LINZESS 290 MCG CAPS capsule TAKE 1 CAPSULE(290 MCG) BY MOUTH DAILY 30 MINUTES BEFORE FIRST MEAL OF THE DAY ON AN EMPTY STOMACH 90 capsule 1  . lisinopril (ZESTRIL) 10 MG tablet TAKE 1 TABLET(10 MG) BY MOUTH DAILY 90 tablet 0  . loratadine (CLARITIN) 10 MG tablet Take 1 tablet (10 mg total) by mouth daily. (Patient taking differently: Take 10 mg by mouth daily as needed. )    . Multiple Vitamins-Minerals (CENTRUM SILVER PO) Take 1 tablet by mouth daily.    . Multiple Vitamins-Minerals (PRESERVISION AREDS 2 PO) Take 1 tablet by mouth daily.    . naltrexone (DEPADE) 50 MG tablet Take 0.5 tablets (25 mg total) by mouth daily. 15 tablet 1  . nitroGLYCERIN (NITROSTAT) 0.4 MG SL tablet Place 1 tablet (0.4 mg total) under the tongue every 5 (five) minutes as needed for chest pain. 25 tablet 0  . potassium chloride (KLOR-CON) 10 MEQ tablet TAKE 1 TABLET(10 MEQ) BY MOUTH TWICE DAILY 180 tablet 1  . Psyllium (METAMUCIL FIBER) 51.7 % PACK Take 5-10 mLs by mouth daily.    . tamsulosin (FLOMAX) 0.4 MG CAPS capsule TAKE 2 CAPSULES(0.8 MG) BY MOUTH DAILY 180 capsule 0  . tiZANidine (ZANAFLEX) 4 MG tablet Take 1 tablet (4 mg total) by mouth 2 (two) times daily as needed for muscle spasms (sedation precautions). 30 tablet 0  . traZODone (DESYREL) 50 MG tablet Take 0.5-1 tablets (25-50 mg total) by mouth at bedtime as needed for sleep. 30 tablet 3  . buPROPion (WELLBUTRIN SR) 100 MG 12 hr tablet Take 1 tablet (100 mg total) by mouth 2 (two) times daily. 60 tablet 1   No facility-administered medications prior to visit.     Per HPI unless specifically indicated in ROS section below Review of Systems Objective:  BP (!) 162/116 (BP Location: Right Arm, Cuff Size: Large)   Pulse 98   Temp 97.8 F (36.6 C) (Temporal)   Ht 5' 11.5" (1.816 m)   Wt 291 lb 5 oz (132.1 kg)   SpO2 95%   BMI 40.06 kg/m   Wt Readings from Last 3 Encounters:  12/31/19 291 lb 5 oz (132.1 kg)   11/06/19 286 lb 5 oz (129.9 kg)  10/08/19 288 lb 1 oz (130.7 kg)      Physical Exam Vitals and nursing note reviewed.  Constitutional:      Appearance: Normal appearance. He is obese. He is not ill-appearing.  Cardiovascular:     Rate and Rhythm: Normal rate and regular rhythm.     Pulses: Normal pulses.     Heart sounds: Normal heart sounds. No murmur.  Pulmonary:     Effort: Pulmonary effort  is normal. No respiratory distress.     Breath sounds: Normal breath sounds. No wheezing, rhonchi or rales.  Musculoskeletal:        General: Normal range of motion.  Neurological:     Mental Status: He is alert.     Comments:  Stiff movements Antalgic gait Action tremor present  Psychiatric:        Mood and Affect: Mood normal.        Behavior: Behavior normal.       Results for orders placed or performed in visit on 10/30/19  VITAMIN D 25 Hydroxy (Vit-D Deficiency, Fractures)  Result Value Ref Range   VITD 36.58 30.00 - 100.00 ng/mL  TSH  Result Value Ref Range   TSH 2.88 0.35 - 4.50 uIU/mL  Basic metabolic panel  Result Value Ref Range   Sodium 135 135 - 145 mEq/L   Potassium 4.5 3.5 - 5.1 mEq/L   Chloride 102 96 - 112 mEq/L   CO2 29 19 - 32 mEq/L   Glucose, Bld 111 (H) 70 - 99 mg/dL   BUN 28 (H) 6 - 23 mg/dL   Creatinine, Ser 1.56 (H) 0.40 - 1.50 mg/dL   GFR 42.82 (L) >60.00 mL/min   Calcium 10.4 8.4 - 10.5 mg/dL   Assessment & Plan:  This visit occurred during the SARS-CoV-2 public health emergency.  Safety protocols were in place, including screening questions prior to the visit, additional usage of staff PPE, and extensive cleaning of exam room while observing appropriate contact time as indicated for disinfecting solutions.   Problem List Items Addressed This Visit    Osteoarthritis   Morbid obesity with BMI of 40.0-44.9, adult ([place]) - Primary    We restarted wellbutrin and naltrexone individually 11/2019 - no significant benefit noted. With increasing pain, he is  planning on restarting hydrocodone 10/325mg  - advised he stop naltrexone. With elevated BP reading today, advised he decrease wellbutrin to 100mg  SR once daily.  Declines nutritionist referral.       Hypertension    Markedly elevated today. Previously well controlled. He is unsure if he took his BP med today (lisinopril 10mg ) - will verify when he gets home. He doesn't think he's been taking lasix. Advised restart 40mg  daily with second tablet PRN weight gain as per instructions.  Had not been checking BP - I asked him to start monitoring and call us in 1 week with BP readings, titrating pending readings.  RTC 6 wks f/u visit.       Essential tremor    Ongoing, presumed essential.  wellbutrin BID may have worsened this - will see effect of once daily dosing.  Discussed option of propranolol especially if BP remaining elevated.       Encounter for chronic pain management    [place] CSRS reviewed.       CKD (chronic kidney disease) stage 3, GFR 30-59 ml/min    Cr trending from 1.2-1.5 over the last 6 months.  Encouraged better BP control.       Chronic lower back pain    Chronic ongoing pain despite spinal cord stimulator in place, recently worsening. Will restart hydrocodone APAP 10/325mg  one pill at a time (#20 refilled last week).           Meds ordered this encounter  Medications  . buPROPion (WELLBUTRIN SR) 100 MG 12 hr tablet    Sig: Take 1 tablet (100 mg total) by mouth daily.    Dispense:  30 tablet  Refill:  1    Note new sig   No orders of the defined types were placed in this encounter.  Patient Instructions  Try higher dose hydrocodone as previously discussed 10/325mg  tablets (1 at a time) for pain.  If you take pain medicine, hold naltrexone.  Decrease bupropion 100mg  once daily.  Start monitoring blood pressures more closely at home. Call me in 1 week with BP readings. Ensure you took lisinopril this morning, you should also be on lasix (furosemide) 40mg  daily  with extra as needed for weight gain of >3 lbs in 1 day or 5 lbs in 1 week  Return in 6 weeks    Follow up plan: Return in about 6 weeks (around 02/11/2020) for follow up visit.  Ria Bush, MD

## 2019-12-31 NOTE — Assessment & Plan Note (Signed)
Chronic ongoing pain despite spinal cord stimulator in place, recently worsening. Will restart hydrocodone APAP 10/325mg  one pill at a time (#20 refilled last week).

## 2019-12-31 NOTE — Assessment & Plan Note (Signed)
Ongoing, presumed essential.  wellbutrin BID may have worsened this - will see effect of once daily dosing.  Discussed option of propranolol especially if BP remaining elevated.

## 2019-12-31 NOTE — Assessment & Plan Note (Addendum)
We restarted wellbutrin and naltrexone individually 11/2019 - no significant benefit noted. With increasing pain, he is planning on restarting hydrocodone 10/325mg  - advised he stop naltrexone. With elevated BP reading today, advised he decrease wellbutrin to 100mg  SR once daily.  Declines nutritionist referral.

## 2019-12-31 NOTE — Patient Instructions (Addendum)
Try higher dose hydrocodone as previously discussed 10/325mg  tablets (1 at a time) for pain.  If you take pain medicine, hold naltrexone.  Decrease bupropion 100mg  once daily.  Start monitoring blood pressures more closely at home. Call me in 1 week with BP readings. Ensure you took lisinopril this morning, you should also be on lasix (furosemide) 40mg  daily with extra as needed for weight gain of >3 lbs in 1 day or 5 lbs in 1 week  Return in 6 weeks

## 2019-12-31 NOTE — Assessment & Plan Note (Signed)
Cr trending from 1.2-1.5 over the last 6 months.  Encouraged better BP control.

## 2019-12-31 NOTE — Assessment & Plan Note (Signed)
[place] CSRS reviewed  ?

## 2019-12-31 NOTE — Assessment & Plan Note (Addendum)
Markedly elevated today. Previously well controlled. He is unsure if he took his BP med today (lisinopril 10mg ) - will verify when he gets home. He doesn't think he's been taking lasix. Advised restart 40mg  daily with second tablet PRN weight gain as per instructions.  Had not been checking BP - I asked him to start monitoring and call us in 1 week with BP readings, titrating pending readings.  RTC 6 wks f/u visit.

## 2020-01-07 ENCOUNTER — Encounter: Payer: Self-pay | Admitting: Family Medicine

## 2020-01-07 DIAGNOSIS — N1832 Chronic kidney disease, stage 3b: Secondary | ICD-10-CM

## 2020-01-16 ENCOUNTER — Other Ambulatory Visit: Payer: Self-pay

## 2020-01-16 ENCOUNTER — Telehealth: Payer: Self-pay

## 2020-01-16 ENCOUNTER — Ambulatory Visit: Payer: Medicare Other

## 2020-01-16 DIAGNOSIS — I5030 Unspecified diastolic (congestive) heart failure: Secondary | ICD-10-CM

## 2020-01-16 DIAGNOSIS — I10 Essential (primary) hypertension: Secondary | ICD-10-CM

## 2020-01-16 NOTE — Chronic Care Management (AMB) (Signed)
Chronic Care Management Pharmacy  Name: Anthony [name]  MRN: [id number] DOB: 08/08/1936  Chief Complaint/ HPI  Anthony [name],  84 y.o., male contacted by telephone for CCM follow up visit.   PCP : Ria Bush, MD  Their chronic conditions include: hypercholesterolemia, major depressive disorder, constipation, chronic pain, history of atrial flutter, coronary artery disease (stent placed 2013), hypertension, diastolic heart failure, hypothyroidism, sleep apnea, essential tremor, CKD, BPH, vitamin D deficiency, seasonal allergies  Patient concerns: denies medication concerns   Consult Visits:  10/06/19: Hypercholesterolemia - note not available  06/24/19: Hypercholesterolemia - start Praluent injection once every 14 days   Current Diagnosis/Assessment: Goals    . Increase physical activity     Starting 10/24/2018, I will continue to exercise for at least 45 minutes daily.     . Patient Stated     10/30/2019, I will continue to walk for 1 hour everyday.     Marland Kitchen Pharmacy Care Plan     Current Barriers:  . Chronic Disease Management support, education, and care coordination needs related to neuropathic pain, hypertension, heart failure  Pharmacist Clinical Goal(s):  Marland Kitchen Hypertension: Maintain blood pressure < 140/90 mmHg o Continue to check blood pressure at home with son or daughter; Call if BP is consistently above goal. Assess lab work due to recent  lisinopril dose increase. Marland Kitchen Heart Failure: Prevent shortness of breath and fluid overload o Continue furosemide every day with an additional tablet as needed for swelling . Neuropathic pain: Maintain neuropathic pain control o Continue gabapentin 600 mg at bedtime - may take an additional tablet in the morning if needed. Continue Tylenol 500 mg twice daily and hydrocodone for severe pain.   . Weight loss goals: Goal weight < 200 lbs . Vaccinations: Remain up to date on vaccinations.  o Recommend new shingles vaccines (Shingrix).    Interventions: . Comprehensive medication review performed.  Patient Self Care Activities:  . Check with local pharmacy for shingles vaccine . Try increasing gabapentin by adding 1 capsule at breakfast or lunch for nerve pain . Continue furosemide for swelling and take an additional tablet if you notice weight gain of > 3 pounds overnight  . Continue lisinopril for blood pressure - 2 tablets daily and return to lab the last week of May . Consider mediterranean diet for weight loss   Please see past updates related to this goal by clicking on the "Past Updates" button in the selected goal       Diet/Weight Loss   Patient has failed these meds in past: naltrexone/bupropion (ineffective) Patient is currently on the following medications:   No pharmacotherapy  We discussed: Patient reports no longer taking naltrexone or bupropion, both ineffective. Patient experienced weight gain after initiation. 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   Current weight: (prior to starting weight loss medication) 283 lbs --> 290 lbs (today) Goal weight: 200 lbs  Plan: Recommend heart healthy diet for weight loss; Continue exercise.   BPH   Patient has failed these meds in past: none Patient is currently controlled on the following medications:   Tamsulosin 0.4 mg - 1 capsule daily   Finasteride 5 mg - 1 tablet daily  We  discussed: reports symptoms stable; restarted 1 tamsulosin daily since last visit due to BP elevation   Plan: Continue current medications  Neuropathy/Chronic pain   Symptoms: nerve pain down leg to foot, dull and aching back pain Patient has failed these meds in past: tramadol - not effective, tizanidine  - not effective Patient is currently controlled on the following medications:   Gabapentin 300 mg - take 2 capsules at bedtime   Hydrocodone/acetaminophen 10-325 mg - 1 tablet BID PRN   Tylenol 500 mg - 1 tablet twice daily   CrCl: 53 ml/min (Scr: 1.56 10/30/19, adjusted body weight:102 kg) We discussed: patient reports pain is well controlled, only taking hydrocodone a couple days a week for back pain, continues gabapentin 2 capsules at bedtime for nerve pain   Bowel regimen:   Linzess 290 mcg - 1 capsule daily 30 minutes before first meal of the day   Docusate 100 mg - 1 tablet BID  Plan: Continue current medications  Insomnia    Patient has failed these meds in past: Lunesta - grogginess, trazodone - ineffective Patient is currently controlled on the following medications:   Tylenol PM extra strength 25-500 mg - 2 tablet daily at bedtime  Melatonin 10 mg - 1 tablet qhs   We discussed:  Reports sleeping well, continues melatonin and Tylenol PM. Has increased Tylenol PM back to every night. Reports unable to sleep without it. Failed other therapies and has not changed sleep routines (screen use, bedtime, etc). Continues to see benefit on sleep from gabapentin/pain relief.   Plan: Continue current medications  Hyperlipidemia/CAD  Followed by cardiology  Lipid Panel     Component Value Date/Time   CHOL 102 10/06/2019 1031   TRIG 184 (H) 10/06/2019 1031   HDL 41 10/06/2019 1031   CHOLHDL 2.5 10/06/2019 1031   VLDL 37 10/06/2019 1031   LDLCALC 24 10/06/2019 1031   LDLDIRECT 117.0 10/24/2018 1115    CAD: LDL goal < 70 Patient has failed these meds in past: intolerance to statins, cholestyramine, and gemfibrozil (myalgias) Patient is currently controlled on the following medications:   Praluent 75 mg/mL - Inject every 14 days  Ezetimibe 10 mg - 1 tablet daily  Fenofibrate 160mg  - 1 tablet daily  Icosapent Ethyl (Vascepa) 1 gm - 2 capsules BID  Aspirin 81 mg - 1  tablet daily  Nitroglycerin 0.4 mg SL - PRN  We discussed: Confirms adherence to Praluent, ezetimibe, fenofibrate, Vascepa, aspirin. At start of visit, pt reported not taking any oral cholesterol medications. Asked patient go through pill bottles and indeed he did have all of the above medications which was confirmed by refill history. Patient's son fills his pillbox so there is some uncertainty from patient on what he taking.   Plan: Continue current medications  Heart Failure   Type: Diastolic Last ejection fraction: 01/2017, normal LVEF  Patient has failed these meds in past: none  Patient is currently controlled on the following medications:   Furosemide 40 mg - 1 tablet daily and as needed for extra weight   Potassium Chloride 10 mEq - 1 tablet BID >> not taking  We discussed: previously some uncertainty on whether patient is taking his Lasix; he did have the bottle at home tome and confirmed taking daily, denies SOB and swelling; he denies taking potassium and did not have the bottle at home, nor it is it listed in refill history - most recent potassium WNL   Plan: Continue current medications  Hypertension  Office blood pressures are  BP Readings from Last 3 Encounters:  12/31/19 (!) 162/116  11/06/19 124/82  10/08/19 122/82   CMP Latest Ref Rng & Units 10/30/2019 10/06/2019 12/17/2018  Glucose 70 - 99 mg/dL 111(H) - 116(H)  BUN 6 - 23 mg/dL 28(H) - 28(H)  Creatinine 0.40 - 1.50 mg/dL 1.56(H) - 1.22  Sodium 135 - 145 mEq/L 135 - 137  Potassium 3.5 - 5.1 mEq/L 4.5 - 4.0  Chloride 96 - 112 mEq/L 102 - 103  CO2 19 - 32 mEq/L 29 - 29  Calcium 8.4 - 10.5 mg/dL 10.4 - 10.7(H)  Total Protein 6.5 - 8.1 g/dL - 7.5 -  Total Bilirubin 0.3 - 1.2 mg/dL - 0.7 -  Alkaline Phos 38 - 126 U/L - 43 -  AST 15 - 41 U/L - 29 -  ALT 0 - 44 U/L - 29 -   CPAP: wears nightly Patient has failed these meds in the past: none Patient checks BP at home: daughter or son have been checking the  past couple of weeks Patient home BP readings are ranging: ~140/80 mmHg  (elevated at last office visit)  BP goal < 140/90 mmHg Patient is currently controlled on the following medications:   Lisinopril 10 mg - 2 tablets daily (increased from 1 tablet to 2 tablets 12/31/19)  Plan: Continue current medications; Coordinate lab appt for renal function next week (PCP already ordered).  CKD   CrCl: 52 ml/min (adjusted body weight: 100 kg; SCr: 1.56) GFR: (10/30/19) 43 m/miin No dose adjustments recommended.  Medication Management  Pharmacy: [facility], [facility]: [employer]  --> son organizes patient's medications - fills pillbox every 2 weeks  OTC: centrum multivitamin, colace daily, vitamin D daily, Align probiotic, Claritin PRN, (no longer taking Preservision), dulcolax liquid PRN  Affordability: referred to [facility] ([facility]) for medicare plan comparisons; Praluent through manufacturer assistance    Vaccines: Recommend Shingrix  CCM Follow Up: 3 months (telephone)  Debbora Dus, PharmD Clinical Pharmacist [facility] at [facility] [phone]

## 2020-01-16 NOTE — Patient Instructions (Addendum)
Jan 16, 2020  Dear [name],  Below is a summary of the goals we discussed during our appointment on Jan 16, 2020. Please contact me anytime with questions or concerns.   Visit Information  Goals Addressed            This Visit's Progress   . Pharmacy Care Plan       Current Barriers:  . Chronic Disease Management support, education, and care coordination needs related to neuropathic pain, hypertension, heart failure  Pharmacist Clinical Goal(s):  Marland Kitchen Hypertension: Maintain blood pressure < 140/90 mmHg o Continue to check blood pressure at home with son or daughter; Call if BP is consistently above goal. Assess lab work due to recent  lisinopril dose increase. Marland Kitchen Heart Failure: Prevent shortness of breath and fluid overload o Continue furosemide every day with an additional tablet as needed for swelling . Neuropathic pain: Maintain neuropathic pain control o Continue gabapentin 600 mg at bedtime - may take an additional tablet in the morning if needed. Continue Tylenol 500 mg twice daily and hydrocodone for severe pain.   . Weight loss goals: Goal weight < 200 lbs . Vaccinations: Remain up to date on vaccinations.  o Recommend new shingles vaccines (Shingrix).   Interventions: . Comprehensive medication review performed.  Patient Self Care Activities:  . Check with local pharmacy for shingles vaccine . Try increasing gabapentin by adding 1 capsule at breakfast or lunch for nerve pain . Continue furosemide for swelling and take an additional tablet if you notice weight gain of > 3 pounds overnight  . Continue lisinopril for blood pressure - 2 tablets daily and return to lab the last week of May . Consider mediterranean diet for weight loss   Please see past updates related to this goal by clicking on the "Past Updates" button in the selected goal       The patient verbalized understanding of instructions provided today and agreed to receive a mailed copy of patient  instruction and/or educational materials.  Telephone follow up appointment with pharmacy team member scheduled for: 04/16/20 at 11:00 AM (telephone)  Debbora Dus, PharmD Clinical Pharmacist [facility] at [facility] [phone]     Why follow it? Research shows. . Those who follow the Mediterranean diet have a reduced risk of heart disease  . The diet is associated with a reduced incidence of Parkinson's and Alzheimer's diseases . People following the diet may have longer life expectancies and lower rates of chronic diseases  . The Dietary Guidelines for Americans recommends the Mediterranean diet as an eating plan to promote health and prevent disease  What Is the Mediterranean Diet?  . Healthy eating plan based on typical foods and recipes of Mediterranean-style cooking . The diet is primarily a plant based diet; these foods should make up a majority of meals   Starches - Plant based foods should make up a majority of meals - They are an important sources of vitamins, minerals, energy, antioxidants, and fiber - Choose whole grains, foods high in fiber and minimally processed items  - Typical grain sources include wheat, oats, barley, corn, brown rice, bulgar, farro, millet, polenta, couscous  - Various types of beans include chickpeas, lentils, fava beans, black beans, white beans   Fruits  Veggies - Large quantities of antioxidant rich fruits & veggies; 6 or more servings  - Vegetables can be eaten raw or lightly drizzled with oil and cooked  - Vegetables common to the traditional Mediterranean Diet  include: artichokes, arugula, beets, broccoli, brussel sprouts, cabbage, carrots, celery, collard greens, cucumbers, eggplant, kale, leeks, lemons, lettuce, mushrooms, okra, onions, peas, peppers, potatoes, pumpkin, radishes, rutabaga, shallots, spinach, sweet potatoes, turnips, zucchini - Fruits common to the Mediterranean Diet include: apples, apricots, avocados, cherries,  clementines, dates, figs, grapefruits, grapes, melons, nectarines, oranges, peaches, pears, pomegranates, strawberries, tangerines  Fats - Replace butter and margarine with healthy oils, such as olive oil, canola oil, and tahini  - Limit nuts to no more than a handful a day  - Nuts include walnuts, almonds, pecans, pistachios, pine nuts  - Limit or avoid candied, honey roasted or heavily salted nuts - Olives are central to the [employer] - can be eaten whole or used in a variety of dishes   Meats Protein - Limiting red meat: no more than a few times a month - When eating red meat: choose lean cuts and keep the portion to the size of deck of cards - Eggs: approx. 0 to 4 times a week  - Fish and lean poultry: at least 2 a week  - Healthy protein sources include, chicken, [place], lean beef, lamb - Increase intake of seafood such as tuna, salmon, trout, mackerel, shrimp, scallops - Avoid or limit high fat processed meats such as sausage and bacon  Dairy - Include moderate amounts of low fat dairy products  - Focus on healthy dairy such as fat free yogurt, skim milk, low or reduced fat cheese - Limit dairy products higher in fat such as whole or 2% milk, cheese, ice cream  Alcohol - Moderate amounts of red wine is ok  - No more than 5 oz daily for women (all ages) and men older than age 1  - No more than 10 oz of wine daily for men younger than 54  Other - Limit sweets and other desserts  - Use herbs and spices instead of salt to flavor foods  - Herbs and spices common to the traditional Mediterranean Diet include: basil, bay leaves, chives, cloves, cumin, fennel, garlic, lavender, marjoram, mint, oregano, parsley, pepper, rosemary, sage, savory, sumac, tarragon, thyme   It's not just a diet, it's a lifestyle:  . The Mediterranean diet includes lifestyle factors typical of those in the region  . Foods, drinks and meals are best eaten with others and savored . Daily physical activity is  important for overall good health . This could be strenuous exercise like running and aerobics . This could also be more leisurely activities such as walking, housework, yard-work, or taking the stairs . Moderation is the key; a balanced and healthy diet accommodates most foods and drinks . Consider portion sizes and frequency of consumption of certain foods   Meal Ideas & Options:  . Breakfast:  o Whole wheat toast or whole wheat English muffins with peanut butter & hard boiled egg o Steel cut oats topped with apples & cinnamon and skim milk  o Fresh fruit: banana, strawberries, melon, berries, peaches  o Smoothies: strawberries, bananas, greek yogurt, peanut butter o Low fat greek yogurt with blueberries and granola  o Egg white omelet with spinach and mushrooms o Breakfast couscous: whole wheat couscous, apricots, skim milk, cranberries  . Sandwiches:  o Hummus and grilled vegetables (peppers, zucchini, squash) on whole wheat bread   o Grilled chicken on whole wheat pita with lettuce, tomatoes, cucumbers or tzatziki  o Tuna salad on whole wheat bread: tuna salad made with greek yogurt, olives, red peppers, capers, green onions o  Garlic rosemary lamb pita: lamb sauted with garlic, rosemary, salt & pepper; add lettuce, cucumber, greek yogurt to pita - flavor with lemon juice and black pepper  . Seafood:  o Mediterranean grilled salmon, seasoned with garlic, basil, parsley, lemon juice and black pepper o Shrimp, lemon, and spinach whole-grain pasta salad made with low fat greek yogurt  o Seared scallops with lemon orzo  o Seared tuna steaks seasoned salt, pepper, coriander topped with tomato mixture of olives, tomatoes, olive oil, minced garlic, parsley, green onions and cappers  . Meats:  o Herbed greek chicken salad with kalamata olives, cucumber, feta  o Red bell peppers stuffed with spinach, bulgur, lean ground beef (or lentils) & topped with feta   o Kebabs: skewers of chicken,  tomatoes, onions, zucchini, squash  o [place] burgers: made with red onions, mint, dill, lemon juice, feta cheese topped with roasted red peppers . Vegetarian o Cucumber salad: cucumbers, artichoke hearts, celery, red onion, feta cheese, tossed in olive oil & lemon juice  o Hummus and whole grain pita points with a greek salad (lettuce, tomato, feta, olives, cucumbers, red onion) o Lentil soup with celery, carrots made with vegetable broth, garlic, salt and pepper  o Tabouli salad: parsley, bulgur, mint, scallions, cucumbers, tomato, radishes, lemon juice, olive oil, salt and pepper.

## 2020-01-16 NOTE — Telephone Encounter (Signed)
Please call patient to schedule lab appointment  for next week. Labs requested/ordered by PCP. Thanks!  Debbora Dus, PharmD Clinical Pharmacist [facility] at [facility] [phone]

## 2020-01-17 NOTE — Progress Notes (Signed)
I have collaborated with the care management provider regarding care management and care coordination activities outlined in this encounter and have reviewed this encounter including documentation in the note and care plan. I am certifying that I agree with the content of this note and encounter as supervising physician.  

## 2020-01-19 ENCOUNTER — Other Ambulatory Visit: Payer: Self-pay

## 2020-01-19 ENCOUNTER — Other Ambulatory Visit (INDEPENDENT_AMBULATORY_CARE_PROVIDER_SITE_OTHER): Payer: Medicare Other

## 2020-01-19 DIAGNOSIS — N1832 Chronic kidney disease, stage 3b: Secondary | ICD-10-CM

## 2020-01-19 NOTE — Telephone Encounter (Signed)
Called and got patient scheduled for lab appointment!

## 2020-01-20 LAB — RENAL FUNCTION PANEL
Albumin: 4.2 g/dL (ref 3.5–5.2)
BUN: 26 mg/dL — ABNORMAL HIGH (ref 6–23)
CO2: 31 mEq/L (ref 19–32)
Calcium: 10.5 mg/dL (ref 8.4–10.5)
Chloride: 100 mEq/L (ref 96–112)
Creatinine, Ser: 1.51 mg/dL — ABNORMAL HIGH (ref 0.40–1.50)
GFR: 44.43 mL/min — ABNORMAL LOW (ref >60.00)
Glucose, Bld: 195 mg/dL — ABNORMAL HIGH (ref 70–99)
Phosphorus: 3.7 mg/dL (ref 2.3–4.6)
Potassium: 4.3 mEq/L (ref 3.5–5.1)
Sodium: 137 mEq/L (ref 135–145)

## 2020-01-21 ENCOUNTER — Telehealth: Payer: Self-pay

## 2020-01-21 MED ORDER — HYDROCODONE-ACETAMINOPHEN 10-325 MG PO TABS: 1.0000 | ORAL_TABLET | Freq: Three times a day (TID) | ORAL | 0 refills | Status: DC | PRN

## 2020-01-21 NOTE — Telephone Encounter (Signed)
ERx sent in.

## 2020-01-21 NOTE — Telephone Encounter (Signed)
Patient called to request hydrocodone/acetaminophen 10-325 mg refill. Please call patient if refill too soon or appointment is needed.  Thanks!  Debbora Dus, PharmD Clinical Pharmacist [facility] at [facility] [phone]

## 2020-01-26 ENCOUNTER — Other Ambulatory Visit: Payer: Self-pay | Admitting: Family Medicine

## 2020-02-05 ENCOUNTER — Other Ambulatory Visit: Payer: Self-pay | Admitting: Family Medicine

## 2020-02-06 ENCOUNTER — Ambulatory Visit (INDEPENDENT_AMBULATORY_CARE_PROVIDER_SITE_OTHER): Payer: Medicare Other | Admitting: Family Medicine

## 2020-02-06 ENCOUNTER — Encounter: Payer: Self-pay | Admitting: Family Medicine

## 2020-02-06 ENCOUNTER — Other Ambulatory Visit: Payer: Self-pay

## 2020-02-06 VITALS — BP 138/90 | HR 88 | Ht 72.0 in | Wt 295.0 lb

## 2020-02-06 DIAGNOSIS — M5442 Lumbago with sciatica, left side: Secondary | ICD-10-CM

## 2020-02-06 DIAGNOSIS — I10 Essential (primary) hypertension: Secondary | ICD-10-CM | POA: Diagnosis not present

## 2020-02-06 DIAGNOSIS — I2510 Atherosclerotic heart disease of native coronary artery without angina pectoris: Secondary | ICD-10-CM | POA: Diagnosis not present

## 2020-02-06 DIAGNOSIS — N400 Enlarged prostate without lower urinary tract symptoms: Secondary | ICD-10-CM

## 2020-02-06 DIAGNOSIS — Z9689 Presence of other specified functional implants: Secondary | ICD-10-CM

## 2020-02-06 DIAGNOSIS — Z6841 Body Mass Index (BMI) 40.0 and over, adult: Secondary | ICD-10-CM

## 2020-02-06 DIAGNOSIS — G8929 Other chronic pain: Secondary | ICD-10-CM

## 2020-02-06 DIAGNOSIS — R2681 Unsteadiness on feet: Secondary | ICD-10-CM

## 2020-02-06 DIAGNOSIS — M48061 Spinal stenosis, lumbar region without neurogenic claudication: Secondary | ICD-10-CM

## 2020-02-06 DIAGNOSIS — M79605 Pain in left leg: Secondary | ICD-10-CM

## 2020-02-06 MED ORDER — LISINOPRIL 10 MG PO TABS: 10.0000 mg | ORAL_TABLET | Freq: Two times a day (BID) | ORAL | 1 refills | Status: DC

## 2020-02-06 NOTE — Progress Notes (Addendum)
This visit was conducted in person.  BP 138/90   Pulse 88   Ht 6' (1.829 m)   Wt 295 lb (133.8 kg)   SpO2 94%   BMI 40.01 kg/m    CC: HTN f/u visit  Subjective:    Patient ID: Anthony [name], male    DOB: 03-23-1936, 84 y.o.   MRN: [id number]  HPI: Anthony [name] is a 84 y.o. male presenting on 02/06/2020 for Hypertension   See prior note for details.  BP recently markedly elevated. New BP regimen is lisinopril 10mg  twice daily (increase from QD) + lasix 40mg  daily.  He's also dropped tamsulosin to 0.4mg  once daily (with improvement in dizziness).   He feels he's overall doing well with balance. However, he had a fall 2 wks ago when left foot inverted - landed on buttock and back with residual foot bruising and swelling now largely improved - since that time he notes left leg weakness - needs assistance of arm to flex at hip, with some numbness and pain anterior leg. Known R sciatica, now having left leg pain ?sciatica.     Relevant past medical, surgical, family and social history reviewed and updated as indicated. Interim medical history since our last visit reviewed. Allergies and medications reviewed and updated. Outpatient Medications Prior to Visit  Medication Sig Dispense Refill  . acetaminophen (TYLENOL) 500 MG tablet Take 1 tablet (500 mg total) by mouth 2 (two) times daily as needed for moderate pain.    . Alirocumab (PRALUENT) 75 MG/ML SOAJ Inject 1 pen into the skin every 14 (fourteen) days. 2 pen 11  . aspirin EC 81 MG tablet Take 1 tablet (81 mg total) by mouth daily.    . Azelastine HCl 0.15 % SOLN U 1 TO 2 SPRAYS IEN BID    . Cholecalciferol (VITAMIN D3) 1000 units CAPS Take 1 capsule (1,000 Units total) by mouth daily. 30 capsule   . docusate sodium (COLACE) 100 MG capsule Take 100 mg by mouth 2 (two) times daily.    Marland Kitchen ezetimibe (ZETIA) 10 MG tablet TAKE 1 TABLET(10 MG) BY MOUTH DAILY 90 tablet 3  . fenofibrate 160 MG tablet TAKE 1 TABLET BY MOUTH EVERY DAY 90  tablet 3  . finasteride (PROSCAR) 5 MG tablet TAKE 1 TABLET(5 MG) BY MOUTH DAILY 90 tablet 0  . furosemide (LASIX) 40 MG tablet TAKE 1 TABLET BY MOUTH EVERY DAY. TAKE AN EXTRA TABLET AS NEEDED FOR WEIGHT GAIN OF 3 LBS/DAY OR 5LBS/WEEK 135 tablet 3  . gabapentin (NEURONTIN) 300 MG capsule Take 2 capsules (600 mg total) by mouth at bedtime. With extra tablet during the day as needed 200 capsule 1  . HYDROcodone-acetaminophen (NORCO) 10-325 MG tablet Take 1 tablet by mouth every 8 (eight) hours as needed. 20 tablet 0  . Icosapent Ethyl (VASCEPA) 1 g CAPS Take 2 capsules (2 g total) by mouth 2 (two) times daily. 120 capsule 11  . ketoconazole (NIZORAL) 2 % cream Apply 1 application topically daily as needed (for dermatitits).     Marland Kitchen ketoconazole (NIZORAL) 2 % shampoo APP EXT TO THE SKIN 3 TIMES WEEKLY    . levothyroxine (SYNTHROID) 175 MCG tablet TAKE 1 TABLET(175 MCG) BY MOUTH DAILY BEFORE BREAKFAST 90 tablet 3  . LINZESS 290 MCG CAPS capsule TAKE 1 CAPSULE(290 MCG) BY MOUTH DAILY 30 MINUTES BEFORE FIRST MEAL OF THE DAY ON AN EMPTY STOMACH 90 capsule 1  . loratadine (CLARITIN) 10 MG tablet Take 1 tablet (10  mg total) by mouth daily. (Patient taking differently: Take 10 mg by mouth daily as needed. )    . Multiple Vitamins-Minerals (CENTRUM SILVER PO) Take 1 tablet by mouth daily.    . Multiple Vitamins-Minerals (PRESERVISION AREDS 2 PO) Take 1 tablet by mouth daily.    . nitroGLYCERIN (NITROSTAT) 0.4 MG SL tablet Place 1 tablet (0.4 mg total) under the tongue every 5 (five) minutes as needed for chest pain. 25 tablet 0  . tamsulosin (FLOMAX) 0.4 MG CAPS capsule Take 1 capsule (0.4 mg total) by mouth daily after supper. 90 capsule 0  . buPROPion (WELLBUTRIN SR) 100 MG 12 hr tablet Take 1 tablet (100 mg total) by mouth daily. 30 tablet 1  . lisinopril (ZESTRIL) 10 MG tablet TAKE 1 TABLET(10 MG) BY MOUTH DAILY 90 tablet 3  . benzonatate (TESSALON) 100 MG capsule Take 1 capsule (100 mg total) by mouth 3  (three) times daily as needed for cough. (Patient not taking: Reported on 01/16/2020) 30 capsule 0  . Eszopiclone 3 MG TABS Take 1 tablet (3 mg total) by mouth at bedtime as needed. Take immediately before bedtime (Patient not taking: Reported on 01/16/2020) 30 tablet 0  . naltrexone (DEPADE) 50 MG tablet Take 0.5 tablets (25 mg total) by mouth daily. (Patient not taking: Reported on 01/16/2020) 15 tablet 1  . potassium chloride (KLOR-CON) 10 MEQ tablet TAKE 1 TABLET(10 MEQ) BY MOUTH TWICE DAILY (Patient not taking: Reported on 01/16/2020) 180 tablet 1  . Psyllium (METAMUCIL FIBER) 51.7 % PACK Take 5-10 mLs by mouth daily. (Patient not taking: Reported on 01/16/2020)    . tiZANidine (ZANAFLEX) 4 MG tablet Take 1 tablet (4 mg total) by mouth 2 (two) times daily as needed for muscle spasms (sedation precautions). (Patient not taking: Reported on 01/16/2020) 30 tablet 0  . traZODone (DESYREL) 50 MG tablet Take 0.5-1 tablets (25-50 mg total) by mouth at bedtime as needed for sleep. (Patient not taking: Reported on 01/16/2020) 30 tablet 3   No facility-administered medications prior to visit.     Per HPI unless specifically indicated in ROS section below Review of Systems Objective:  BP 138/90   Pulse 88   Ht 6' (1.829 m)   Wt 295 lb (133.8 kg)   SpO2 94%   BMI 40.01 kg/m   Wt Readings from Last 3 Encounters:  02/06/20 295 lb (133.8 kg)  12/31/19 291 lb 5 oz (132.1 kg)  11/06/19 286 lb 5 oz (129.9 kg)      Physical Exam Vitals and nursing note reviewed.  Constitutional:      Appearance: Normal appearance. He is obese. He is not ill-appearing.  Cardiovascular:     Rate and Rhythm: Normal rate and regular rhythm.     Pulses: Normal pulses.     Heart sounds: Normal heart sounds. No murmur heard.   Pulmonary:     Effort: Pulmonary effort is normal. No respiratory distress.     Breath sounds: Normal breath sounds. No wheezing, rhonchi or rales.  Musculoskeletal:        General: Swelling  (pedal) present.     Comments:  No reproducible pain midline spine No significant paraspinous mm tenderness + SLR on left No pain with int/ext rotation at hip. No pain at SIJ, GTB or sciatic notch bilaterally.   Skin:    General: Skin is warm and dry.     Findings: No rash.  Neurological:     Mental Status: He is alert.  Comments:  Unsteady getting onto exam table 5/5 strength BLE except 4/5 strength L hip flexors 2/2 pain Diminished patellar DTRs bilaterally  Difficulty with heel and doe walk due to imbalance  Psychiatric:        Mood and Affect: Mood normal.        Behavior: Behavior normal.       Results for orders placed or performed in visit on 01/19/20  Renal function panel  Result Value Ref Range   Sodium 137 135 - 145 mEq/L   Potassium 4.3 3.5 - 5.1 mEq/L   Chloride 100 96 - 112 mEq/L   CO2 31 19 - 32 mEq/L   Albumin 4.2 3.5 - 5.2 g/dL   BUN 26 (H) 6 - 23 mg/dL   Creatinine, Ser 1.51 (H) 0.40 - 1.50 mg/dL   Glucose, Bld 195 (H) 70 - 99 mg/dL   Phosphorus 3.7 2.3 - 4.6 mg/dL   GFR 44.43 (L) >60.00 mL/min   Calcium 10.5 8.4 - 10.5 mg/dL   Lab Results  Component Value Date   TSH 2.88 10/30/2019   No results found for: [id number]  Assessment & Plan:  This visit occurred during the SARS-CoV-2 public health emergency.  Safety protocols were in place, including screening questions prior to the visit, additional usage of staff PPE, and extensive cleaning of exam room while observing appropriate contact time as indicated for disinfecting solutions.   Problem List Items Addressed This Visit    Spinal stenosis   Relevant Orders   Ambulatory referral to Physical Therapy   S/P insertion of spinal cord stimulator   Relevant Orders   Ambulatory referral to Physical Therapy   Morbid obesity with BMI of 40.0-44.9, adult ([place])    Currently off all bariatric meds.       Left leg pain    New after fall suffered 2 weeks ago, endorses left anterior thigh pain shooting  down leg with associated numbness and weakness. On exam weakness seems more related to pain than true weakness. No midline lumbar spine pain, overall reassuring exam. No imaging performed today. Will refer for PT. Low threshold to further evaluate if not improving with physical therapy.       General unsteadiness    Anticipate multifactorial - obesity, spinal stenosis, polypharmacy - doing better on lower flomax dose. Will refer to PT for fall prevention/balance training program. Pt agrees with plan.       Essential hypertension - Primary    BP improvement on lisinopril 10mg  bid + lasix 40mg  daily. Home readings 140/90s - adequate. Continue current regimen.       Relevant Medications   lisinopril (ZESTRIL) 10 MG tablet   Chronic lower back pain    Longstanding s/p nerve ablation and spine stimulator Now on hydrocodone 10/325mg  dose.  See below.       Relevant Orders   Ambulatory referral to Physical Therapy   BPH (benign prostatic hyperplasia)    Continues flomax 0.4mg  daily with benefit - higher dose may have worsened dizziness.          Meds ordered this encounter  Medications  . lisinopril (ZESTRIL) 10 MG tablet    Sig: Take 1 tablet (10 mg total) by mouth in the morning and at bedtime.    Dispense:  180 tablet    Refill:  1   Orders Placed This Encounter  Procedures  . Ambulatory referral to Physical Therapy    Referral Priority:   Routine    Referral Type:  Physical Medicine    Referral Reason:   Specialty Services Required    Requested Specialty:   Physical Therapy    Number of Visits Requested:   1    Patient Instructions  Continue lisinopril 10mg  twice daily as well as lasix 40mg  daily. Blood pressures are doing better.  I will refer you to physical therapy.    Follow up plan: Return if symptoms worsen or fail to improve.  Ria Bush, MD

## 2020-02-06 NOTE — Patient Instructions (Signed)
Continue lisinopril 10mg  twice daily as well as lasix 40mg  daily. Blood pressures are doing better.  I will refer you to physical therapy.

## 2020-02-07 DIAGNOSIS — R2681 Unsteadiness on feet: Secondary | ICD-10-CM | POA: Insufficient documentation

## 2020-02-07 NOTE — Assessment & Plan Note (Signed)
Continues flomax 0.4mg  daily with benefit - higher dose may have worsened dizziness.

## 2020-02-07 NOTE — Assessment & Plan Note (Addendum)
BP improvement on lisinopril 10mg  bid + lasix 40mg  daily. Home readings 140/90s - adequate. Continue current regimen.

## 2020-02-07 NOTE — Assessment & Plan Note (Signed)
Anticipate multifactorial - obesity, spinal stenosis, polypharmacy - doing better on lower flomax dose. Will refer to PT for fall prevention/balance training program. Pt agrees with plan.

## 2020-02-07 NOTE — Assessment & Plan Note (Signed)
Currently off all bariatric meds.

## 2020-02-07 NOTE — Assessment & Plan Note (Addendum)
Longstanding s/p nerve ablation and spine stimulator Now on hydrocodone 10/325mg  dose.  See below.

## 2020-02-07 NOTE — Assessment & Plan Note (Addendum)
New after fall suffered 2 weeks ago, endorses left anterior thigh pain shooting down leg with associated numbness and weakness. On exam weakness seems more related to pain than true weakness. No midline lumbar spine pain, overall reassuring exam. No imaging performed today. Will refer for PT. Low threshold to further evaluate if not improving with physical therapy.

## 2020-02-09 NOTE — Telephone Encounter (Signed)
Patient seen last week.

## 2020-02-16 ENCOUNTER — Telehealth: Payer: Self-pay

## 2020-02-16 MED ORDER — HYDROCODONE-ACETAMINOPHEN 10-325 MG PO TABS: 1.0000 | ORAL_TABLET | Freq: Three times a day (TID) | ORAL | 0 refills | Status: DC | PRN

## 2020-02-16 NOTE — Telephone Encounter (Signed)
Patient left voicemail requesting hydrocodone-acetaminophen 10-325 mg refill.  Thanks!  Debbora Dus, PharmD Clinical Pharmacist [facility] at [facility] [phone]

## 2020-02-16 NOTE — Telephone Encounter (Signed)
ERx 

## 2020-02-26 DIAGNOSIS — M545 Low back pain: Secondary | ICD-10-CM | POA: Diagnosis not present

## 2020-02-26 IMAGING — RF DG MYELOGRAM THORACIC
1 series · 14 of 16 positions shown · non-contrast
Comparison: none

CLINICAL DATA: Failure of spinal cord stimulator, subsequent
encounter
TECHNIQUE: Contiguous axial images were obtained through the Thoracic spine
after the intrathecal infusion of infusion. Coronal and sagittal
reconstructions were obtained of the axial image sets.

[Series 1: cp_standard · 0.17mm/px · 14 of 16 slices shown]
[im 1/16]
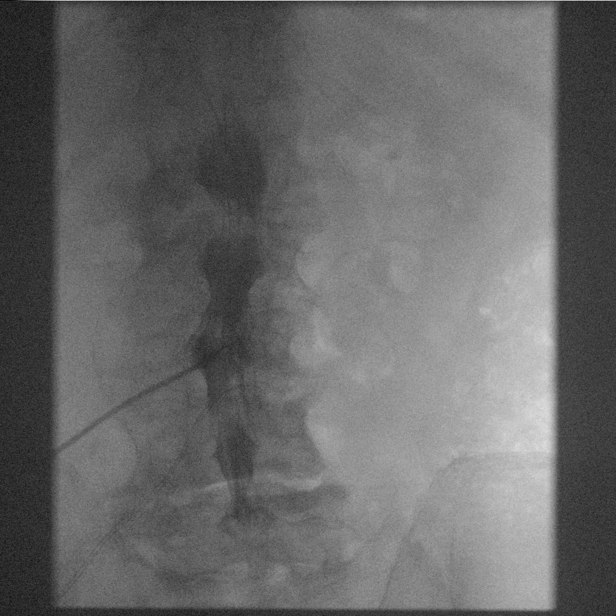
[im 2/16]
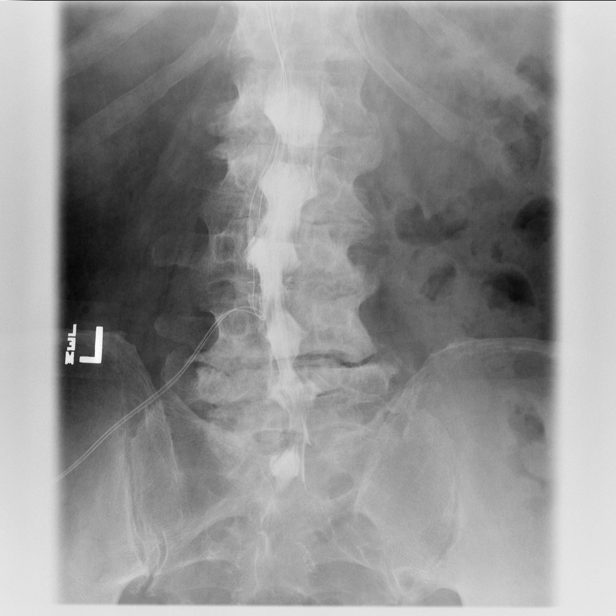
[im 3/16]
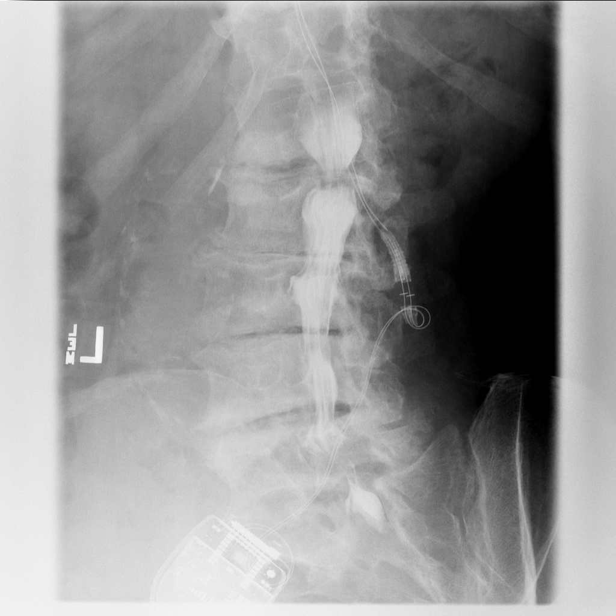
[im 5/16]
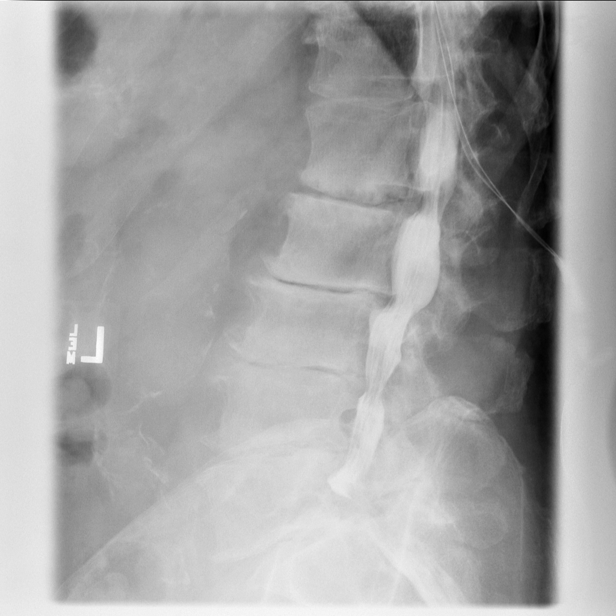
[im 6/16]
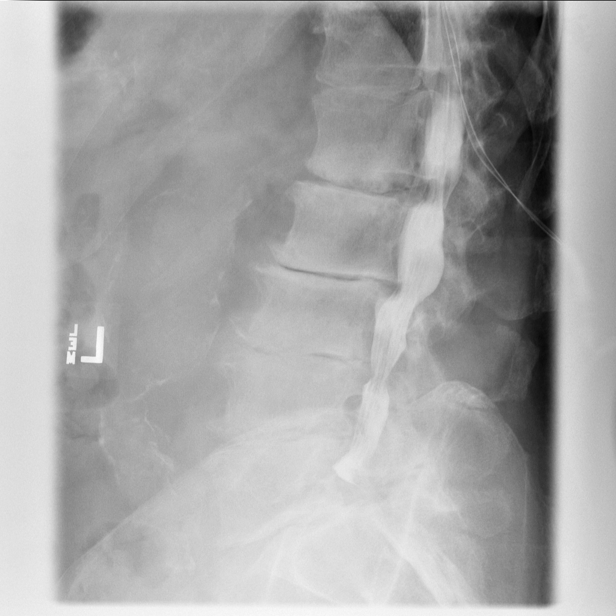
[im 7/16]
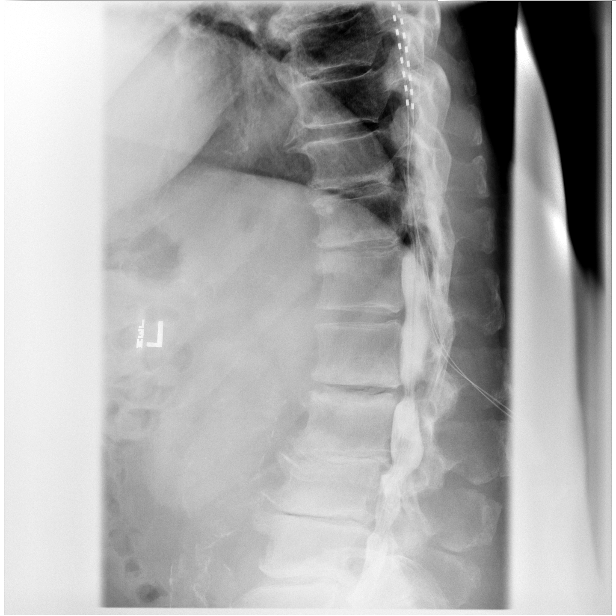
[im 8/16]
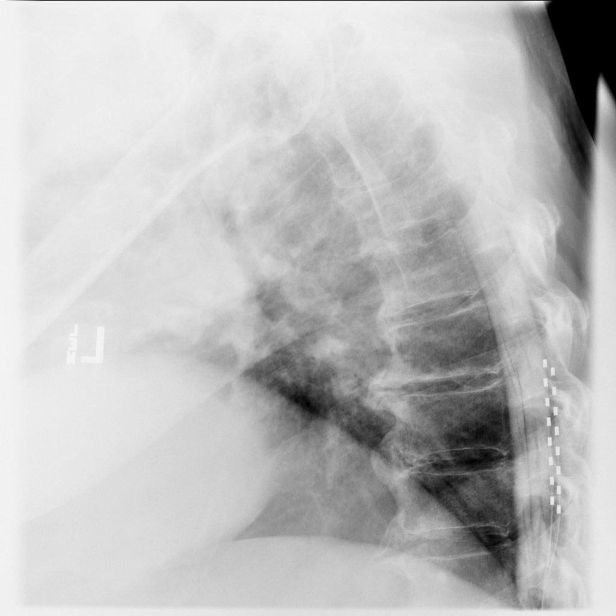
[im 9/16]
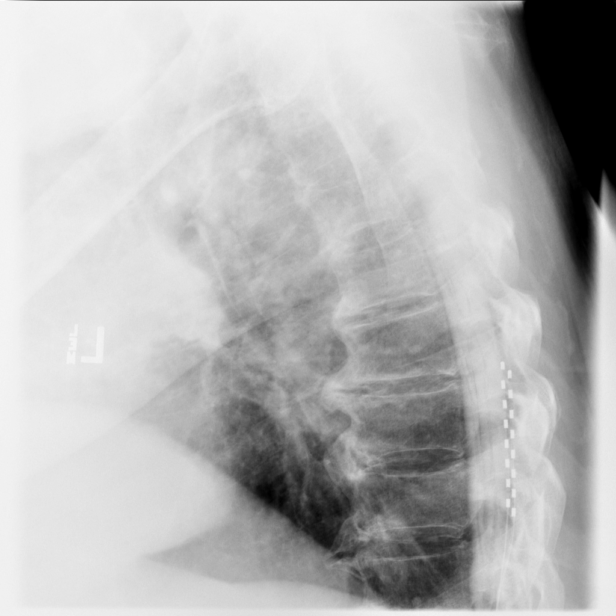
[im 10/16]
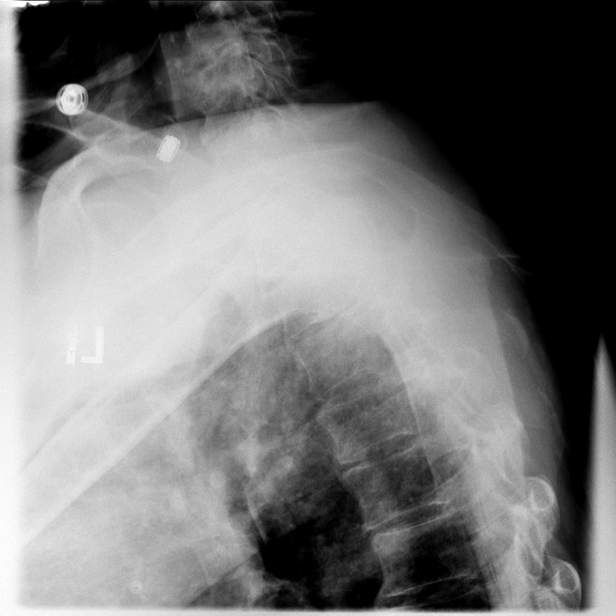
[im 11/16]
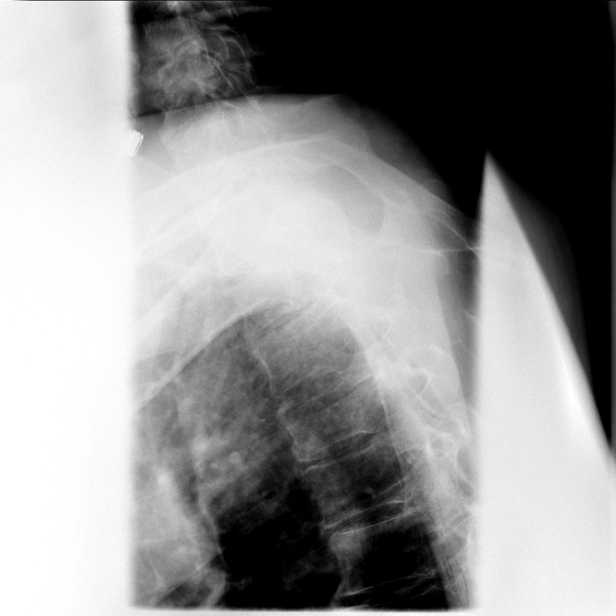
[im 13/16]
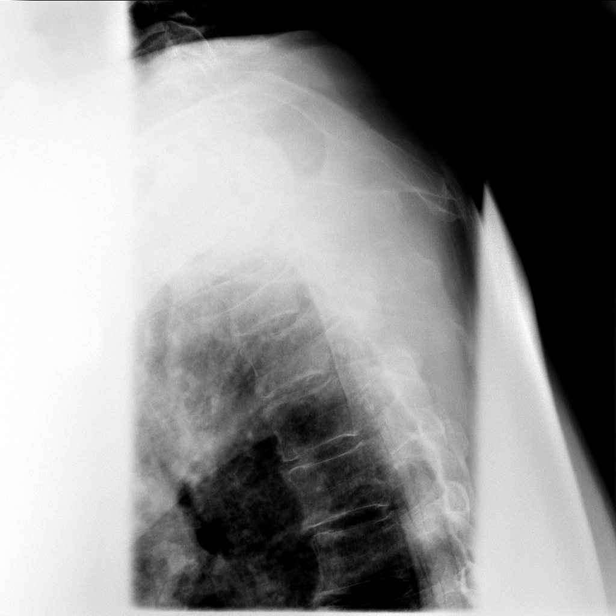
[im 14/16]
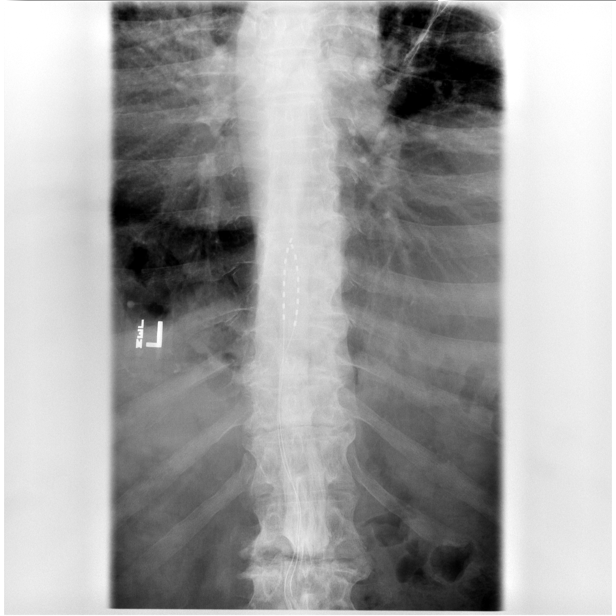
[im 15/16]
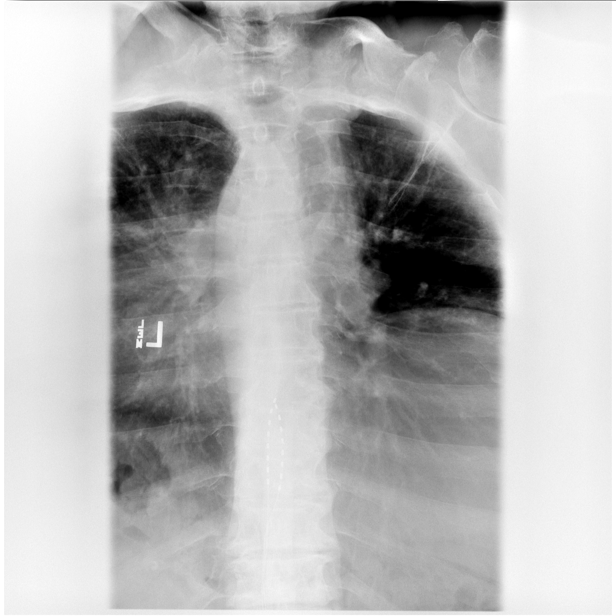
[im 16/16]
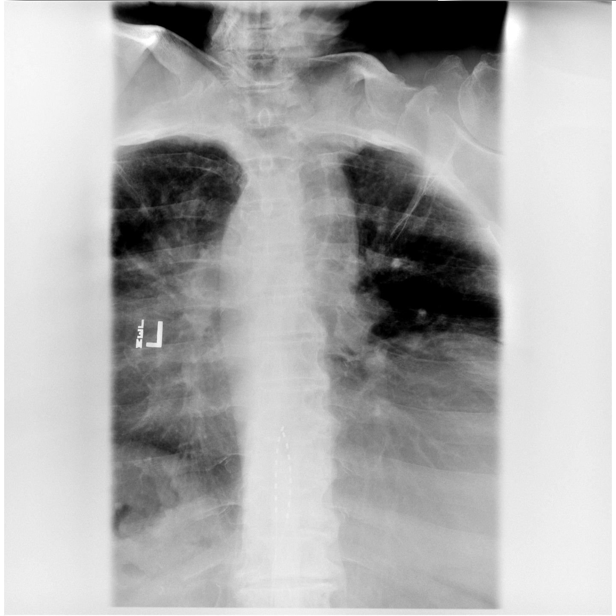

[14 of 16 positions shown; findings below may reference images not displayed]

FLUOROSCOPY TIME:  7 minutes 30 seconds.

PROCEDURE:
LUMBAR PUNCTURE FOR THORACIC MYELOGRAM

After thorough discussion of risks and benefits of the procedure
including bleeding, infection, injury to nerves, blood vessels,
adjacent structures as well as headache and CSF leak, written and
oral informed consent was obtained. Consent was obtained by Dr.
Mitali De Armas.

Patient was positioned prone on the fluoroscopy table. Local
anesthesia was provided with 1% lidocaine without epinephrine after
prepped and draped in the usual sterile fashion. Puncture was
performed at LEVEL using a 3 1/2 inch 22-gauge spinal needle via (_
left ___Paramedian) approach. Using a single pass through the dura,
the needle was placed within the thecal sac, with return of clear
CSF. 10 mL of Isovue 8-GTT was injected into the thecal sac, with
normal opacification of the nerve roots and cauda equina consistent
with free flow within the subarachnoid space. The patient was then
moved to the trendelenburg position and contrast flowed into the
Thoracic spine region.

I personally performed the lumbar puncture and administered the
intrathecal contrast. I also personally supervised acquisition of
the myelogram images.
FINDINGS: THORACIC MYELOGRAM FINDINGS:

Multi level upper lumbar lower thoracic ventral epidural defects
consistent degenerative change. Mild degenerative changes are
present about the upper thoracic spine. Neurostimulator leads appear
to be intact. Lead tips are noted just posterior to the canal on the
myelogram. Reference is made to CT report for full discussion of
findings

CT THORACIC MYELOGRAM FINDINGS:

Reference is made to CT report for full discussion of findings.
IMPRESSION: Degenerative changes neurostimulator as above. Reference is made to
CT report for full discussion of findings.

## 2020-02-26 IMAGING — CT CT L SPINE W/ CM
2 of 3 series · 11 of 34 positions shown, 13 images · IV contrast (agent unspecified)
Comparison: Myelogram images same day.

CLINICAL DATA: Chronic back pain.

EXAM:
CT LUMBAR SPINE WITH CONTRAST
TECHNIQUE: Multidetector CT imaging of the lumbar spine was performed with
intravenous contrast administration.
CONTRAST:  Intrathecal contrast administered at myelography.

[Series 4: l spine soft (person_name) · axial · 0.30mm/px · z∈[-669,-387]mm · 8 of 167 slices shown, 10 images]
[im 13/167  soft-tissue]
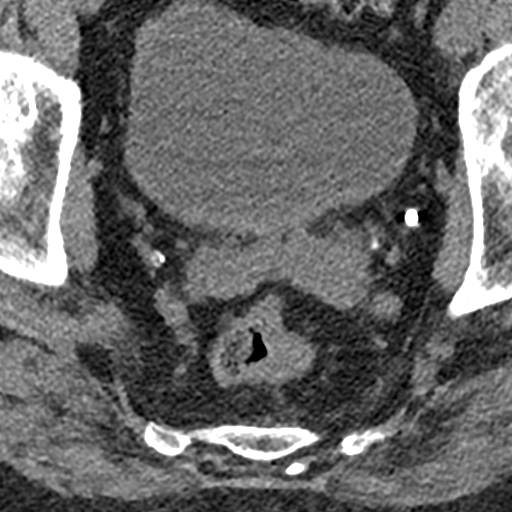
[im 13/167  bone]
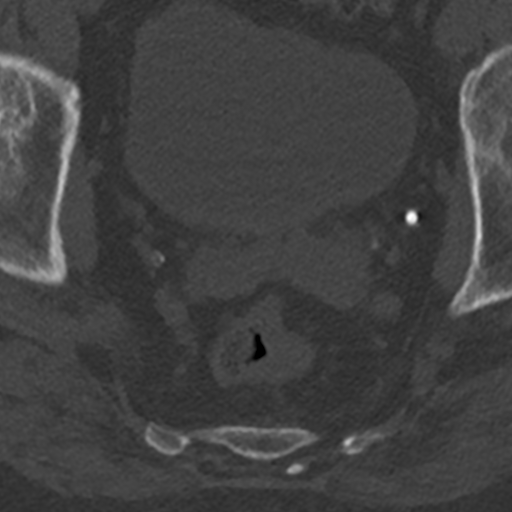
[im 39/167  bone]
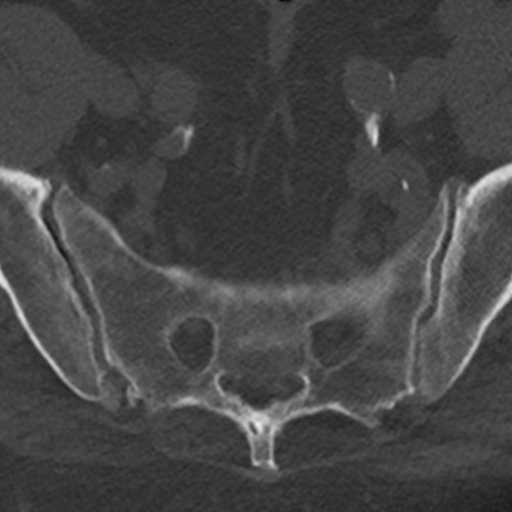
[im 52/167  bone]
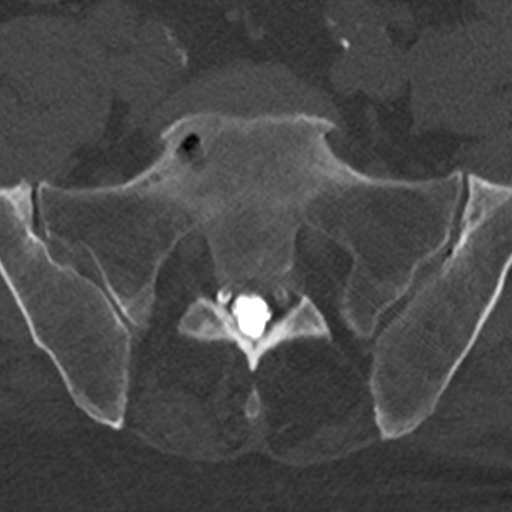
[im 77/167  bone]
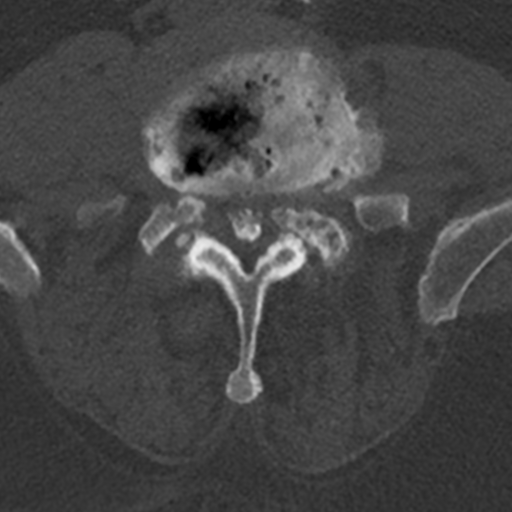
[im 90/167  soft-tissue]
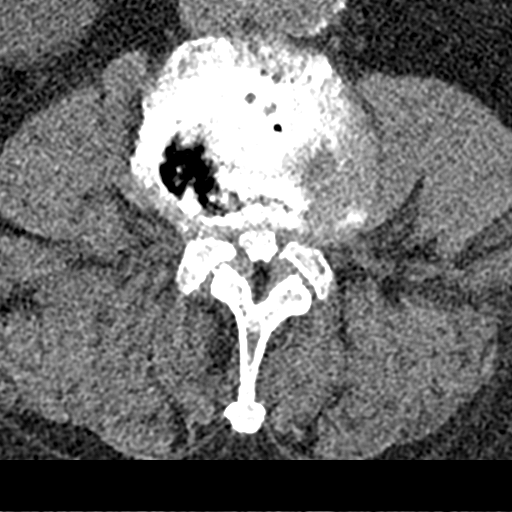
[im 90/167  bone]
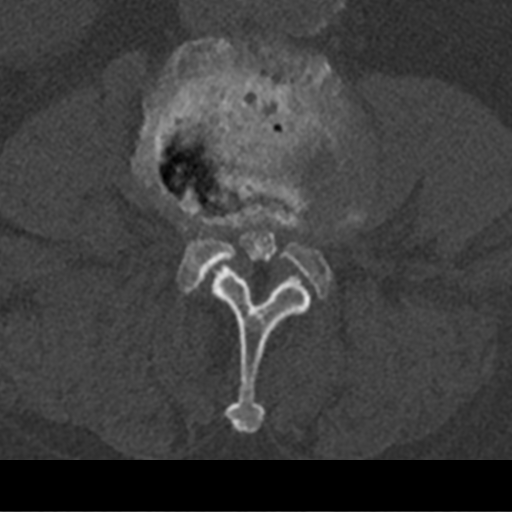
[im 115/167  bone]
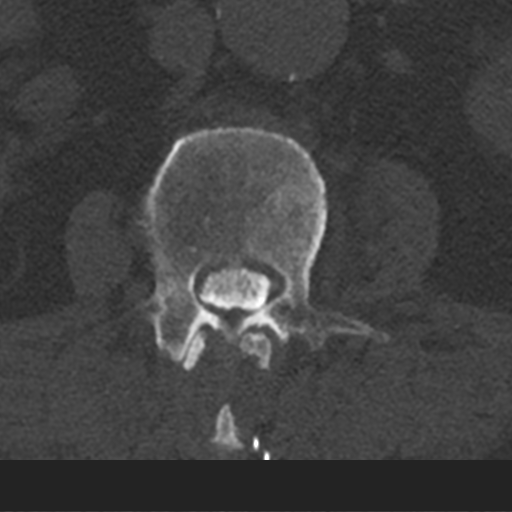
[im 128/167  bone]
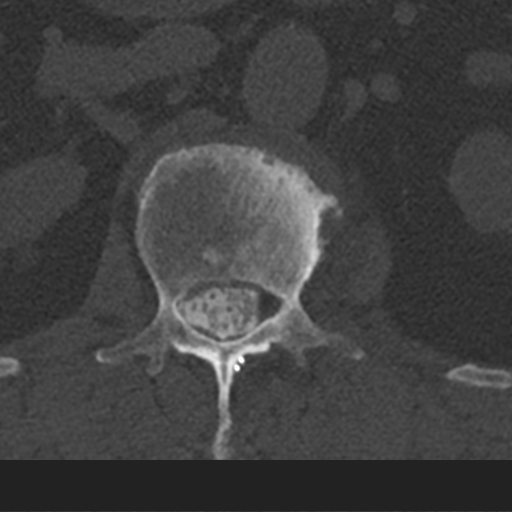
[im 154/167  bone]
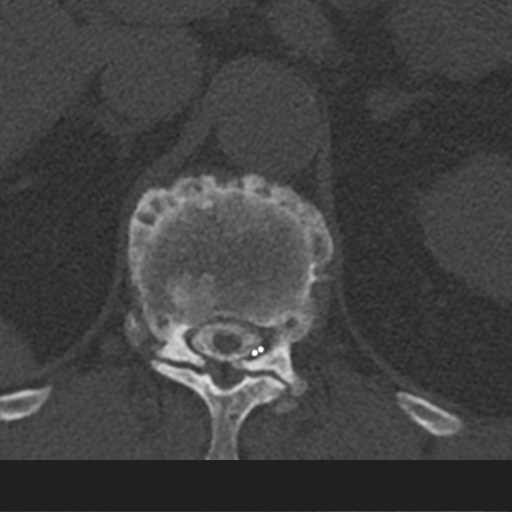

[Series 6: coronal bone · coronal · 0.32mm/px · 3 of 81 slices shown]
[im 17/81  bone]
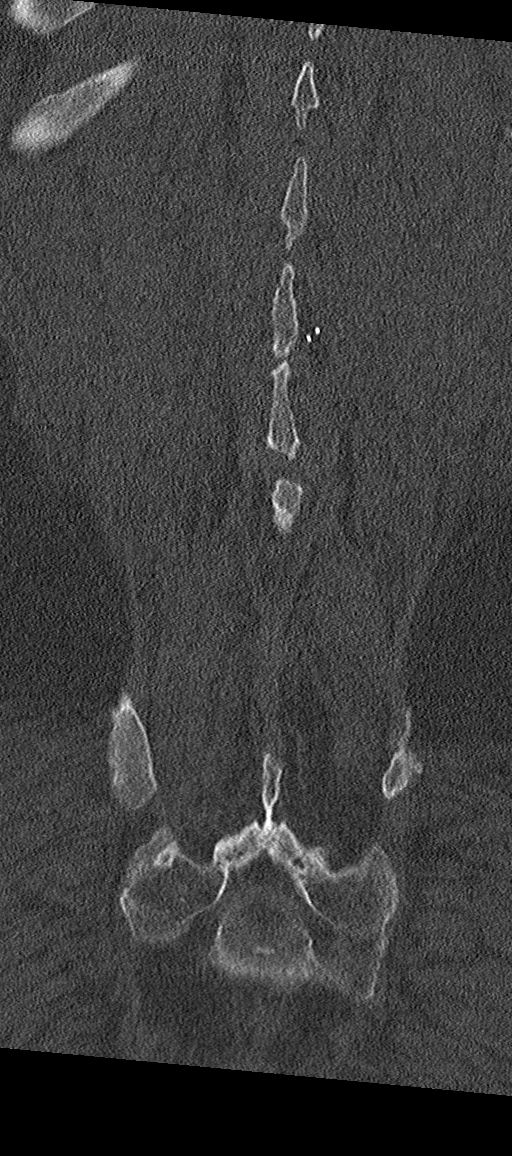
[im 33/81  bone]
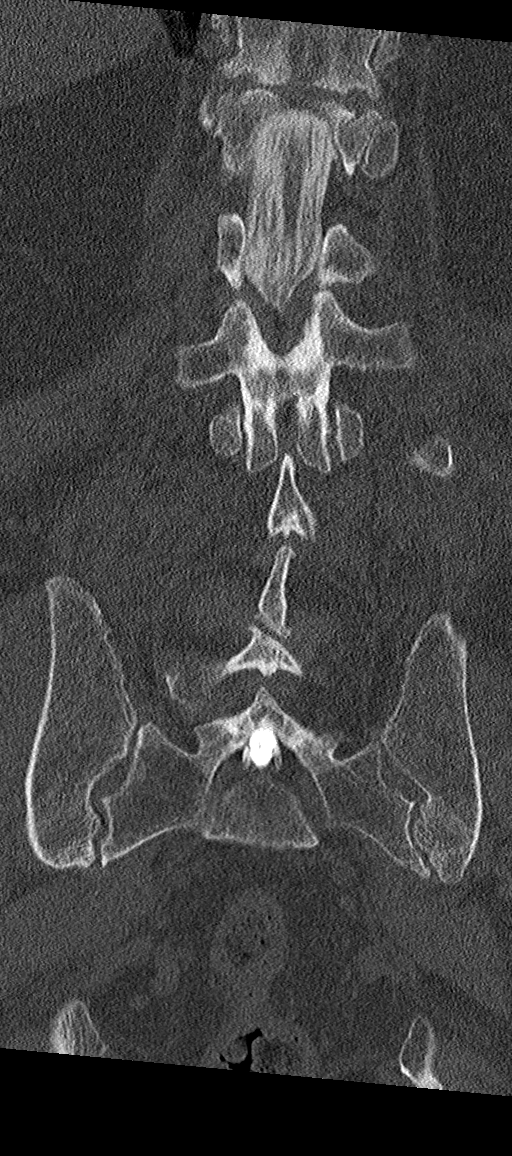
[im 49/81  bone]
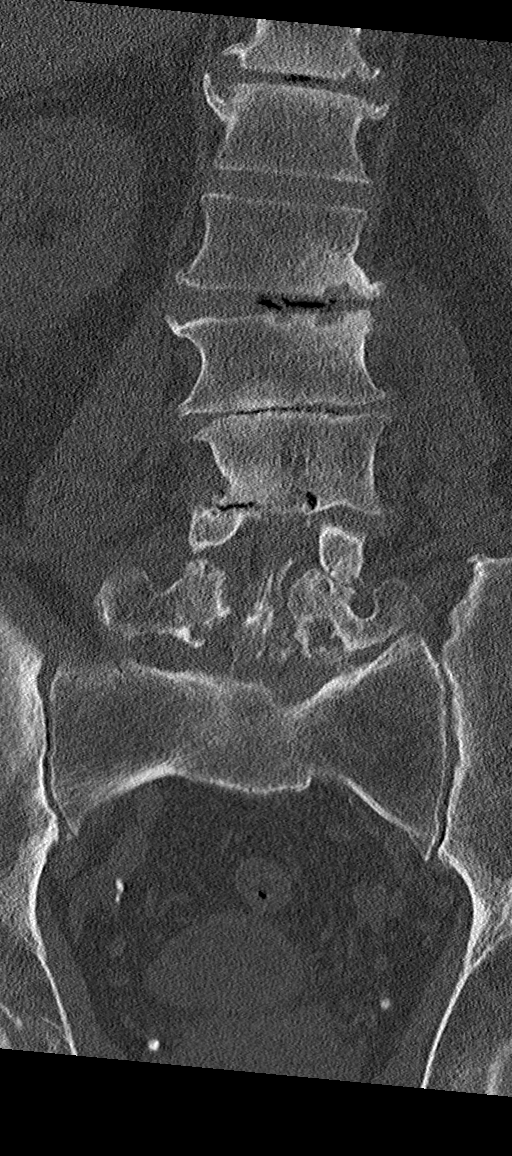

[11 of 34 positions shown; findings below may reference images not displayed]

FINDINGS: Segmentation: 5 lumbar type vertebral bodies.

Alignment: Thoracolumbar curvature convex to the right. Lower lumbar
curvature convex to the left. 3 mm retrolisthesis L2-3 and L3-4. 4
mm retrolisthesis L4-5. 5 mm anterolisthesis L5-S1 due to chronic
pars defects.

Vertebrae: Chronic degenerative endplate changes L1-2 through L4-5.
No fracture or primary bone lesion. Chronic pars defects at L5 as
noted above. Chronic pedicle fracture on the left at L5.

Paraspinal and other soft tissues: Infrarenal abdominal aortic
aneurysm, not completely imaged. Bilobed configuration with maximal
diameter of at least 4.5 cm. Recommend followup by abdomen and
pelvis CTA in 6 months, and vascular surgery referral/consultation
if not already obtained. This recommendation follows ACR consensus
guidelines: White Paper of the ACR Incidental Findings Committee II
on Vascular Findings. [HOSPITAL] 0995; [DATE].

Disc levels: L1-2: Disc degeneration with vacuum phenomenon.
Circumferential protrusion more prominent towards the left. Facet
and ligamentous hypertrophy worse on the left. Multifactorial canal
stenosis that could cause neural compression. Foraminal stenosis
left worse than right that could cause neural compression,
particularly of the left L1 nerve.

L2-3: 3 mm retrolisthesis. Chronic disc degeneration with loss of
height. Endplate osteophytes and bulging of the disc. Small amount
of herniated disc material to the right of midline showing caudal
migration. Stenosis of the lateral recesses and foramina right more
than left. Neural compression could occur at this level,
particularly on the right.

L3-4: 3 mm retrolisthesis. Endplate osteophytes and minimal bulging
disc material. Facet and ligamentous hypertrophy. Multifactorial
stenosis of the canal and foramina that could cause neural
compression on either or both sides.

L4-5: Retrolisthesis of 4 mm. Chronic disc degeneration with
endplate osteophytes and bulging of the disc. Pronounced facet and
ligamentous hypertrophy. Severe stenosis of the central canal,
lateral recesses and neural foramina that could cause neural
compression on either or both sides.

L5-S1: Chronic bilateral pars defects allowing anterolisthesis of 5
mm. Chronic pedicle fracture on the left. Disc degeneration and
vacuum phenomenon. Circumferential bulging of the disc. Very severe
spinal stenosis and neural foraminal stenosis at this level that
could cause neural compression on either or both sides.
IMPRESSION: Bilobed infrarenal abdominal aortic aneurysm, not primarily
evaluated or completely evaluated. Maximal transverse diameter at
least 4.5 cm. Recommend followup by abdomen and pelvis CTA in 6
months, and vascular surgery referral/consultation if not already
obtained. This recommendation follows ACR consensus guidelines:
White Paper of the ACR Incidental Findings Committee II on Vascular
Findings. [HOSPITAL] 0995; [DATE].

Advanced degenerative disease throughout the lumbar region as
outlined above. Multilevel severe spinal stenosis and neural
foraminal stenosis with considerable potential for neural
compression on either or both sides. Findings include chronic
bilateral pars defects at L5 as well as a chronic pedicle fracture
on the left at L5.

## 2020-03-03 DIAGNOSIS — M545 Low back pain: Secondary | ICD-10-CM | POA: Diagnosis not present

## 2020-03-05 DIAGNOSIS — M545 Low back pain: Secondary | ICD-10-CM | POA: Diagnosis not present

## 2020-03-07 ENCOUNTER — Encounter: Payer: Self-pay | Admitting: Family Medicine

## 2020-03-09 DIAGNOSIS — M545 Low back pain: Secondary | ICD-10-CM | POA: Diagnosis not present

## 2020-03-11 ENCOUNTER — Other Ambulatory Visit: Payer: Self-pay

## 2020-03-11 DIAGNOSIS — M545 Low back pain: Secondary | ICD-10-CM | POA: Diagnosis not present

## 2020-03-11 MED ORDER — KETOCONAZOLE 2 % EX SHAM: MEDICATED_SHAMPOO | CUTANEOUS | 2 refills | Status: DC

## 2020-03-11 MED ORDER — FLUOCINONIDE 0.05 % EX SOLN: 1.0000 "application " | Freq: Two times a day (BID) | CUTANEOUS | 2 refills | Status: DC | PRN

## 2020-03-11 NOTE — Progress Notes (Signed)
Patient requesting refill on medications. Called [facility] to confirm prescriptions.

## 2020-03-13 ENCOUNTER — Other Ambulatory Visit: Payer: Self-pay | Admitting: Family Medicine

## 2020-03-15 NOTE — Telephone Encounter (Signed)
[facility] Last filled:  01/03/20, #90 Last OV:  02/06/20, 3 mo f/u Next OV:  none

## 2020-03-18 DIAGNOSIS — M545 Low back pain: Secondary | ICD-10-CM | POA: Diagnosis not present

## 2020-03-18 IMAGING — CR DG THORACIC SPINE 1V
1 series · 1 of 1 positions shown · non-contrast
Comparison: Intraoperative imaging this same day.

CLINICAL DATA: Status post spinal stimulator placement today.

EXAM:
OPERATIVE THORACIC SPINE SINGLE VIEW(S)

[t thoracic spine ap]
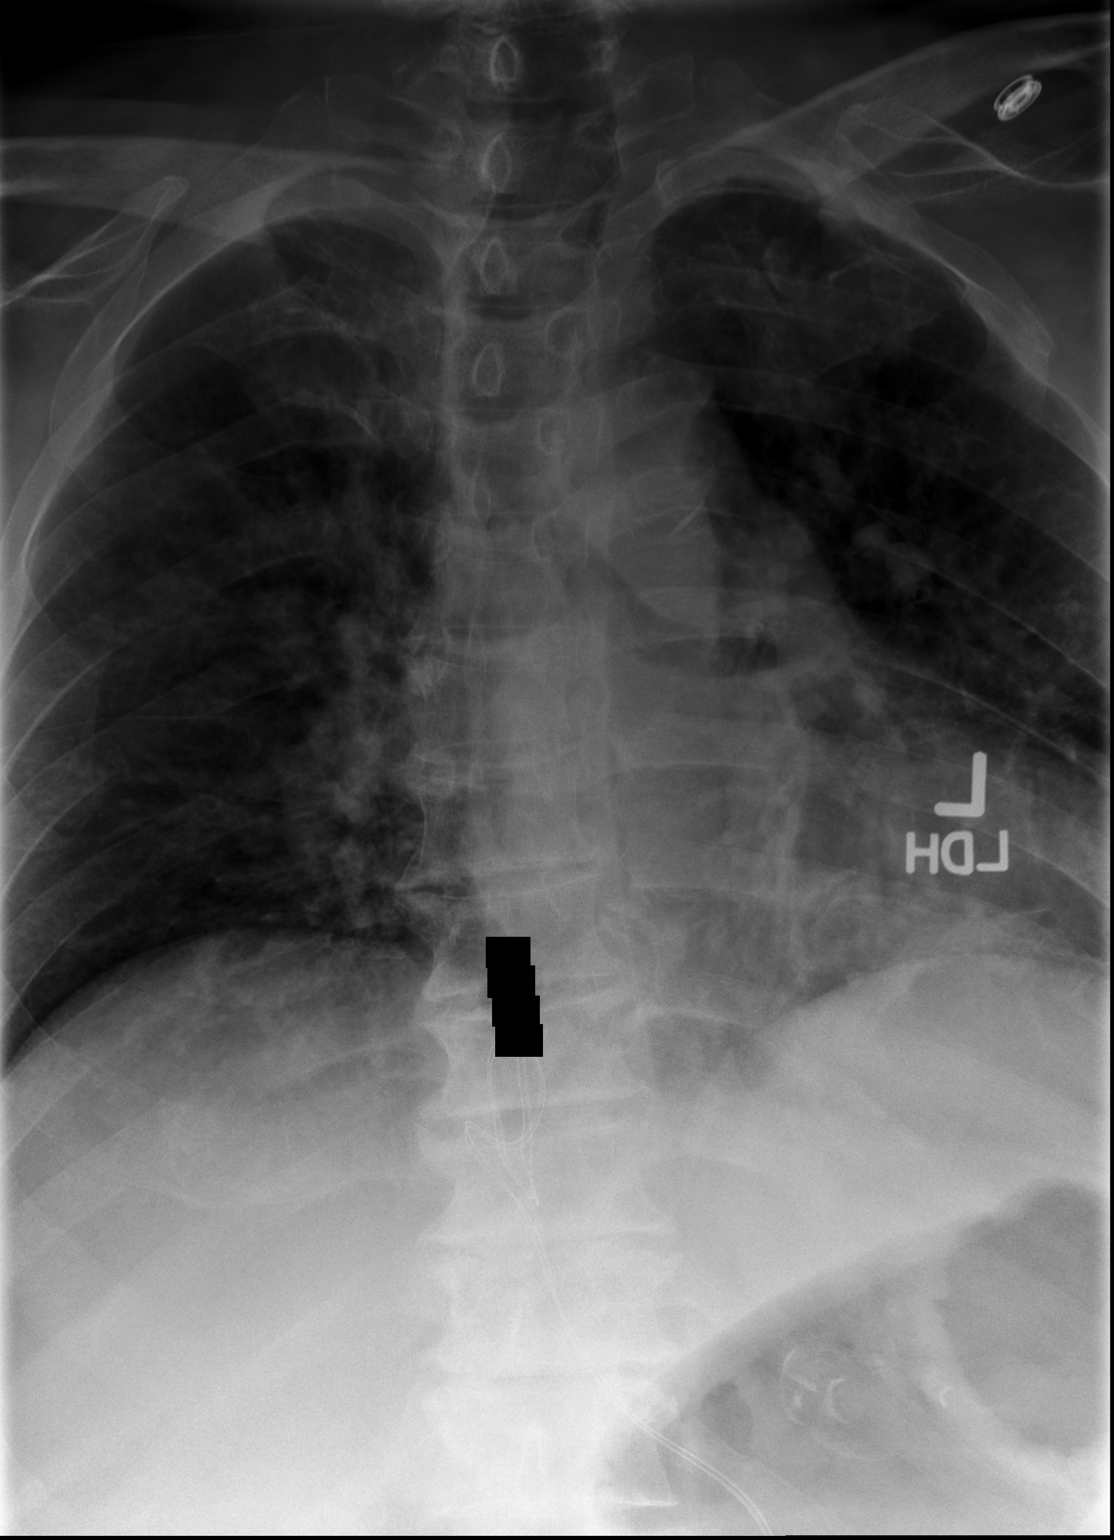

[1 of 1 positions shown; findings below may reference images not displayed]

FINDINGS: Spinal stimulator is in place and projects from mid T9-T10. No acute
finding.
IMPRESSION: As above.

## 2020-03-18 IMAGING — RF DG LUMBAR SPINE 2-3V
1 series · 3 of 3 positions shown · non-contrast
Comparison: No recent prior.

CLINICAL DATA: Lumbar surgery.

EXAM:
LUMBAR SPINE - 2-3 VIEW

[Series 1: run · 3 of 3 slices shown]
[im 1/3]
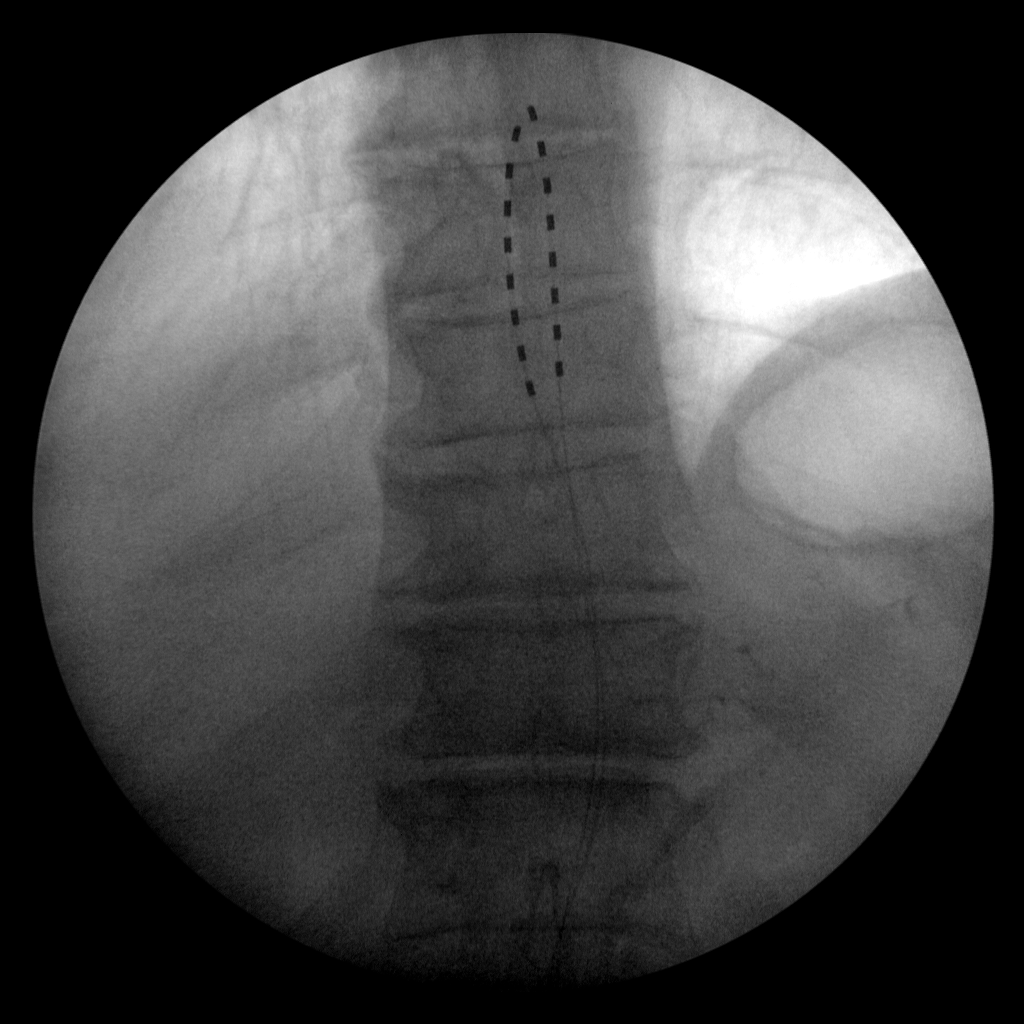
[im 2/3]
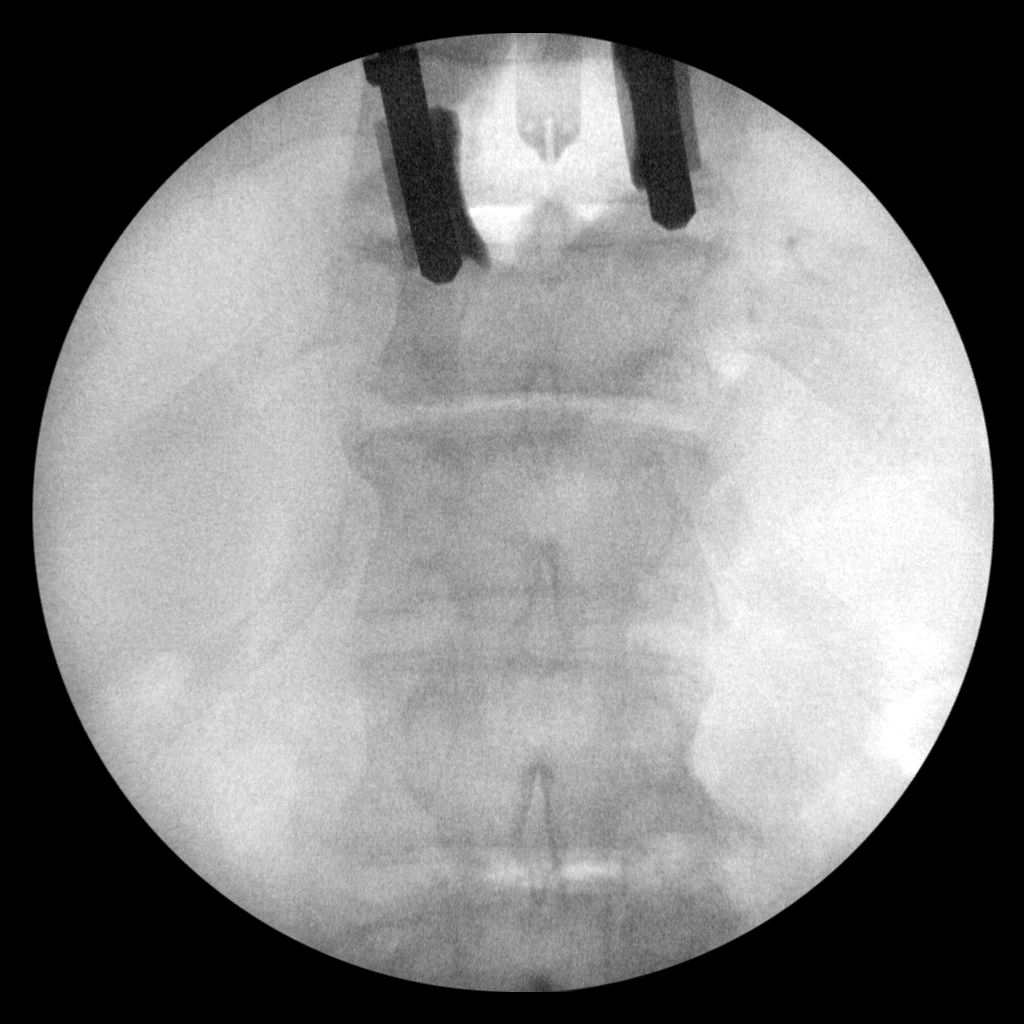
[im 3/3]
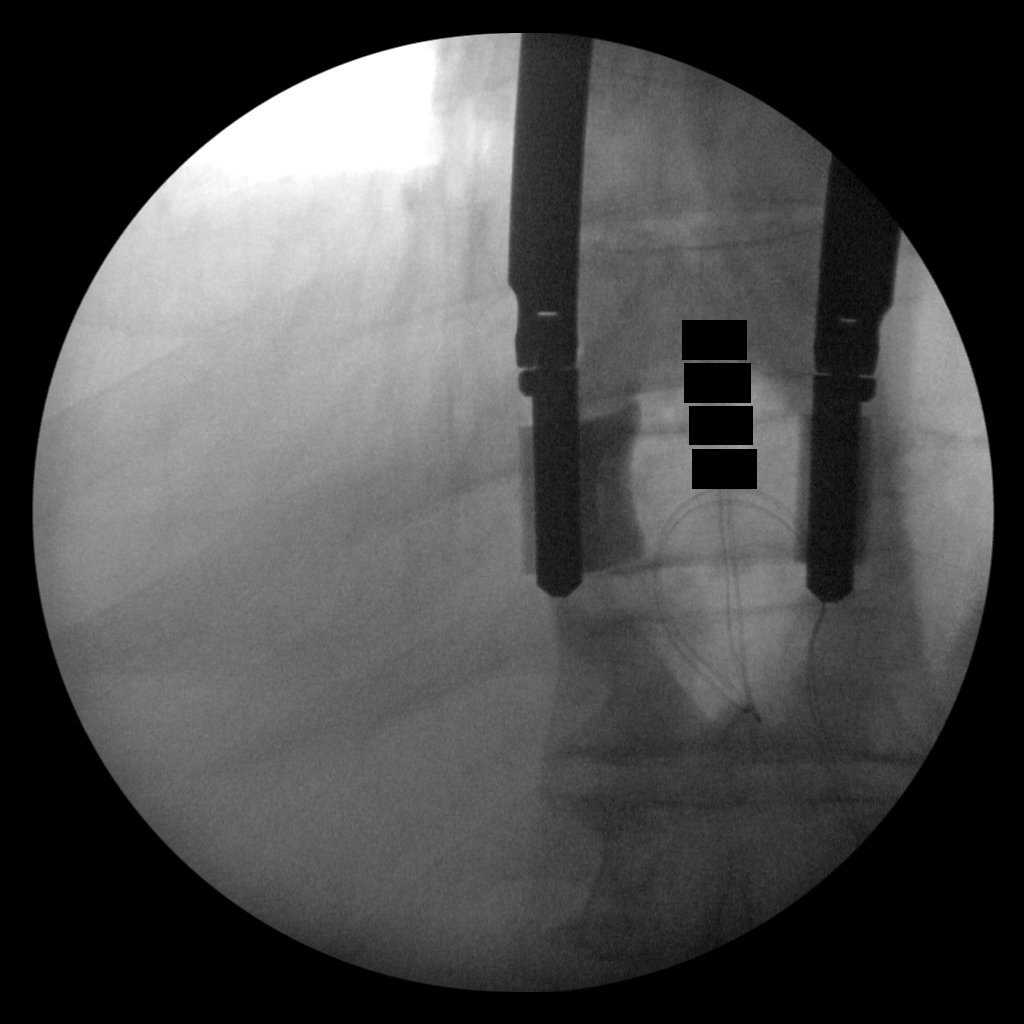

[3 of 3 positions shown; findings below may reference images not displayed]

FINDINGS: Neurostimulator noted with lead tips over the thoracic spine. No
acute bony abnormality. Degenerative change thoracic spine.
IMPRESSION: Neurostimulator noted with lead tips over the thoracic spine.

## 2020-03-22 ENCOUNTER — Telehealth: Payer: Self-pay

## 2020-03-22 NOTE — Progress Notes (Signed)
Pt called reporting left leg pain for the past 2 days. Describes pain as dull, achy. Aggravated by walking, relieved when sitting down. Denies cramping. Denies taking hydrocodone yet and wants to avoid using if possible. Has not tried Tylenol. Recommended trying Tylenol first. Patient would like to know if he could get a prescription for a muscle relaxer to use instead of hydrocodone.  Thanks,   Debbora Dus, PharmD Clinical Pharmacist [facility] at [facility] [phone]

## 2020-03-24 MED ORDER — TIZANIDINE HCL 2 MG PO TABS: 2.0000 mg | ORAL_TABLET | Freq: Two times a day (BID) | ORAL | 0 refills | Status: DC | PRN

## 2020-03-24 NOTE — Telephone Encounter (Signed)
IT changed mapping of pharmacist notes, no longer coming across as phone encounters.  Just receiving now - will send in tizanidine for patient to try as muscle relaxant.

## 2020-04-02 ENCOUNTER — Ambulatory Visit
Admission: RE | Admit: 2020-04-02 | Discharge: 2020-04-02 | Disposition: A | Discharge status: Home / Self Care | Payer: Medicare Other | Source: Ambulatory Visit | Attending: Family Medicine | Admitting: Family Medicine

## 2020-04-02 ENCOUNTER — Ambulatory Visit (INDEPENDENT_AMBULATORY_CARE_PROVIDER_SITE_OTHER): Payer: Medicare Other | Admitting: Family Medicine

## 2020-04-02 ENCOUNTER — Other Ambulatory Visit: Payer: Self-pay | Admitting: Internal Medicine

## 2020-04-02 ENCOUNTER — Ambulatory Visit (INDEPENDENT_AMBULATORY_CARE_PROVIDER_SITE_OTHER)
Admission: RE | Admit: 2020-04-02 | Discharge: 2020-04-02 | Disposition: A | Discharge status: Home / Self Care | Payer: Medicare Other | Source: Ambulatory Visit | Attending: Family Medicine | Admitting: Family Medicine

## 2020-04-02 ENCOUNTER — Telehealth: Payer: Self-pay

## 2020-04-02 ENCOUNTER — Other Ambulatory Visit: Payer: Self-pay

## 2020-04-02 ENCOUNTER — Encounter: Payer: Self-pay | Admitting: Family Medicine

## 2020-04-02 VITALS — BP 164/100 | HR 85 | Temp 97.7°F | Ht 72.0 in | Wt 296.3 lb

## 2020-04-02 DIAGNOSIS — R29898 Other symptoms and signs involving the musculoskeletal system: Secondary | ICD-10-CM | POA: Diagnosis not present

## 2020-04-02 DIAGNOSIS — I714 Abdominal aortic aneurysm, without rupture, unspecified: Secondary | ICD-10-CM

## 2020-04-02 DIAGNOSIS — I2510 Atherosclerotic heart disease of native coronary artery without angina pectoris: Secondary | ICD-10-CM | POA: Diagnosis not present

## 2020-04-02 DIAGNOSIS — I712 Thoracic aortic aneurysm, without rupture: Secondary | ICD-10-CM | POA: Diagnosis not present

## 2020-04-02 DIAGNOSIS — R2681 Unsteadiness on feet: Secondary | ICD-10-CM | POA: Diagnosis not present

## 2020-04-02 DIAGNOSIS — N1832 Chronic kidney disease, stage 3b: Secondary | ICD-10-CM

## 2020-04-02 DIAGNOSIS — M5136 Other intervertebral disc degeneration, lumbar region: Secondary | ICD-10-CM | POA: Diagnosis not present

## 2020-04-02 DIAGNOSIS — M48061 Spinal stenosis, lumbar region without neurogenic claudication: Secondary | ICD-10-CM | POA: Diagnosis not present

## 2020-04-02 DIAGNOSIS — Z136 Encounter for screening for cardiovascular disorders: Secondary | ICD-10-CM | POA: Diagnosis not present

## 2020-04-02 DIAGNOSIS — M79605 Pain in left leg: Secondary | ICD-10-CM | POA: Diagnosis not present

## 2020-04-02 DIAGNOSIS — I700 Atherosclerosis of aorta: Secondary | ICD-10-CM | POA: Diagnosis not present

## 2020-04-02 DIAGNOSIS — I10 Essential (primary) hypertension: Secondary | ICD-10-CM | POA: Diagnosis not present

## 2020-04-02 DIAGNOSIS — Z9689 Presence of other specified functional implants: Secondary | ICD-10-CM

## 2020-04-02 DIAGNOSIS — G8929 Other chronic pain: Secondary | ICD-10-CM

## 2020-04-02 DIAGNOSIS — M4316 Spondylolisthesis, lumbar region: Secondary | ICD-10-CM | POA: Diagnosis not present

## 2020-04-02 MED ORDER — LISINOPRIL 20 MG PO TABS: 20.0000 mg | ORAL_TABLET | Freq: Every day | ORAL | 1 refills | Status: DC

## 2020-04-02 MED ORDER — AMLODIPINE BESYLATE 5 MG PO TABS: 5.0000 mg | ORAL_TABLET | Freq: Every day | ORAL | 6 refills | Status: DC

## 2020-04-02 NOTE — Telephone Encounter (Signed)
Spoke with patient regarding lumbar xray and vascular abd aorta US. Red flags to seek ER care reviewed.   Please schedule 2pm lab visit on Monday - pt aware.

## 2020-04-02 NOTE — Patient Instructions (Addendum)
You have some new left leg weakness with your pain.  Xrays of lower back today. We may try to get you set up for a nerve study pending results of xray.  Continue tizanidine and aleve as up to now.  Blood pressure is up today - change lisinopril to 20mg  daily in the morning, add amlodipine 5mg  daily  Return in 1 month for follow up

## 2020-04-02 NOTE — Telephone Encounter (Signed)
Please notify pt - he has possibly enlarging aortic aneurysm - when was last time this was evaluated and does he have vascular doctor following this? I want to get urgent AAA ultrasound and have ordered. Will also need urgent VVS eval if he doesn't already have vascular doctor following this.  Will send to [employer].

## 2020-04-02 NOTE — Telephone Encounter (Signed)
Noted  

## 2020-04-02 NOTE — Telephone Encounter (Signed)
Spoke with pt relaying Dr. Synthia Innocent message and that we are trying to get imaging scheduled today.  Pt verbalizes understanding.  States it has been "awhile" since he's seen his vascular doctor.

## 2020-04-02 NOTE — Telephone Encounter (Signed)
Received call from [place] at [facility] calling report on Lumbar Spine x-ray today. Radiologist wanted to bring to PCP attention #4 impression 4. Aortic atherosclerosis. Calcified aortic aneurysm measuring at least 6.2 cm, not well assessed by radiograph. This measures at 4.5 cm on 2019 CT. Recommend dedicated aortic imaging as well as referral to a vascular specialist. This recommendation follows ACR consensus guidelines: White Paper of the ACR Incidental Findings Committee II on Vascular Findings. J Am Coll Radiol 2013; 10:789-794.  Advised this will be brought to PCP attention.

## 2020-04-02 NOTE — Telephone Encounter (Addendum)
Anthony [name] with [facility] [name] calling report on US aortic duplex. Pt is waiting. Info given to Dr Darnell Level. Dr Darnell Level said to tell pt it is OK for pt to go home; Dr Darnell Level is going to do vascular surgeon referral to get pt established; the vascular surgeons [facility] is out today but will be back next wk and pt will get cb next wk.

## 2020-04-02 NOTE — Addendum Note (Signed)
Addended by: Ria Bush on: 04/02/2020 01:27 PM   Modules accepted: Orders

## 2020-04-02 NOTE — Progress Notes (Signed)
This visit was conducted in person.  BP (!) 164/100 (BP Location: Right Arm, Patient Position: Sitting, Cuff Size: Large)   Pulse 85   Temp 97.7 F (36.5 C) (Temporal)   Ht 6' (1.829 m)   Wt 296 lb 5 oz (134.4 kg)   SpO2 94%   BMI 40.19 kg/m    CC: L leg pain  Subjective:    Patient ID: Anthony [name], male    DOB: 10-24-1935, 84 y.o.   MRN: [id number]  HPI: Anthony [name] is a 84 y.o. male presenting on 04/02/2020 for Leg Pain (C/o left leg pain radiating up into left hip.  Started about 3 wks ago. )   2-3 wk h/o L lower leg pain radiating up the leg to the L lateral hip associated with inability to cross L leg on his knee. No paresthesias of leg. Tried tizanidine and aleve with some benefit. Denies inciting trauma, injury or fall.  He did have a fall late 12/2019 landed on left foot and buttock/back - doesn't feel this fall is related to current pain/symptoms.  Referred to PT on 01/2020 - no significant benefit with this.  Known spinal stenosis.  Pain exacerbated by putting sock on.    HTN - currently on lisinopril 10mg  bid and lasix 40mg  daily. BP at home running high as well despite compliance with current regimen.      Relevant past medical, surgical, family and social history reviewed and updated as indicated. Interim medical history since our last visit reviewed. Allergies and medications reviewed and updated. Outpatient Medications Prior to Visit  Medication Sig Dispense Refill  . acetaminophen (TYLENOL) 500 MG tablet Take 1 tablet (500 mg total) by mouth 2 (two) times daily as needed for moderate pain.    . Alirocumab (PRALUENT) 75 MG/ML SOAJ Inject 1 pen into the skin every 14 (fourteen) days. 2 pen 11  . aspirin EC 81 MG tablet Take 1 tablet (81 mg total) by mouth daily.    . Azelastine HCl 0.15 % SOLN U 1 TO 2 SPRAYS IEN BID    . Cholecalciferol (VITAMIN D3) 1000 units CAPS Take 1 capsule (1,000 Units total) by mouth daily. 30 capsule   . docusate sodium (COLACE) 100  MG capsule Take 100 mg by mouth 2 (two) times daily.    Marland Kitchen ezetimibe (ZETIA) 10 MG tablet TAKE 1 TABLET(10 MG) BY MOUTH DAILY 90 tablet 3  . fenofibrate 160 MG tablet TAKE 1 TABLET BY MOUTH EVERY DAY 90 tablet 3  . finasteride (PROSCAR) 5 MG tablet TAKE 1 TABLET(5 MG) BY MOUTH DAILY 90 tablet 0  . fluocinonide (LIDEX) 0.05 % external solution Apply 1 application topically 2 (two) times daily as needed. 60 mL 2  . furosemide (LASIX) 40 MG tablet TAKE 1 TABLET BY MOUTH EVERY DAY. TAKE AN EXTRA TABLET AS NEEDED FOR WEIGHT GAIN OF 3 LBS/DAY OR 5LBS/WEEK 135 tablet 3  . gabapentin (NEURONTIN) 300 MG capsule Take 2 capsules (600 mg total) by mouth at bedtime. With extra tablet during the day as needed 200 capsule 1  . HYDROcodone-acetaminophen (NORCO) 10-325 MG tablet Take 1 tablet by mouth every 8 (eight) hours as needed. 20 tablet 0  . Icosapent Ethyl (VASCEPA) 1 g CAPS Take 2 capsules (2 g total) by mouth 2 (two) times daily. 120 capsule 11  . ketoconazole (NIZORAL) 2 % cream Apply 1 application topically daily as needed (for dermatitits).     Marland Kitchen ketoconazole (NIZORAL) 2 % shampoo Apply topically  as directed. Apply 3 times weekly 120 mL 2  . levothyroxine (SYNTHROID) 175 MCG tablet TAKE 1 TABLET(175 MCG) BY MOUTH DAILY BEFORE BREAKFAST 90 tablet 3  . LINZESS 290 MCG CAPS capsule TAKE 1 CAPSULE(290 MCG) BY MOUTH DAILY 30 MINUTES BEFORE FIRST MEAL OF THE DAY ON AN EMPTY STOMACH 90 capsule 1  . loratadine (CLARITIN) 10 MG tablet Take 1 tablet (10 mg total) by mouth daily. (Patient taking differently: Take 10 mg by mouth daily as needed. )    . Multiple Vitamins-Minerals (CENTRUM SILVER PO) Take 1 tablet by mouth daily.    . Multiple Vitamins-Minerals (PRESERVISION AREDS 2 PO) Take 1 tablet by mouth daily.    . nitroGLYCERIN (NITROSTAT) 0.4 MG SL tablet Place 1 tablet (0.4 mg total) under the tongue every 5 (five) minutes as needed for chest pain. 25 tablet 0  . tamsulosin (FLOMAX) 0.4 MG CAPS capsule Take 1  capsule (0.4 mg total) by mouth daily after supper. 90 capsule 0  . tiZANidine (ZANAFLEX) 2 MG tablet Take 1-2 tablets (2-4 mg total) by mouth 2 (two) times daily as needed for muscle spasms. 30 tablet 0  . lisinopril (ZESTRIL) 10 MG tablet Take 1 tablet (10 mg total) by mouth in the morning and at bedtime. 180 tablet 1   No facility-administered medications prior to visit.     Per HPI unless specifically indicated in ROS section below Review of Systems Objective:  BP (!) 164/100 (BP Location: Right Arm, Patient Position: Sitting, Cuff Size: Large)   Pulse 85   Temp 97.7 F (36.5 C) (Temporal)   Ht 6' (1.829 m)   Wt 296 lb 5 oz (134.4 kg)   SpO2 94%   BMI 40.19 kg/m   Wt Readings from Last 3 Encounters:  04/02/20 296 lb 5 oz (134.4 kg)  02/06/20 295 lb (133.8 kg)  12/31/19 291 lb 5 oz (132.1 kg)      Physical Exam Vitals and nursing note reviewed.  Constitutional:      Appearance: Normal appearance. He is obese. He is not ill-appearing.  Musculoskeletal:        General: No tenderness. Normal range of motion.     Right lower leg: No edema.     Left lower leg: No edema.     Comments:  No pain midline spine No paraspinous mm tenderness Neg SLR bilaterally except for hamstring tightness L>R. Limited ROM with int/ext rotation at hip. Pain with FABER on right. No pain at SIJ, GTB bilaterally. Discomfort to L sciatic notch.   Skin:    General: Skin is warm and dry.     Findings: No rash.  Neurological:     Mental Status: He is alert.     Sensory: Sensation is intact.     Motor: Weakness present.     Coordination: Coordination is intact.     Comments:  Diminished patellar DTRs bilaterally Unable to heel or toe walk due to unsteadiness 5/5 strength BLE except for 4-/5 strength LLE knee flexors       Results for orders placed or performed in visit on 01/19/20  Renal function panel  Result Value Ref Range   Sodium 137 135 - 145 mEq/L   Potassium 4.3 3.5 - 5.1 mEq/L    Chloride 100 96 - 112 mEq/L   CO2 31 19 - 32 mEq/L   Albumin 4.2 3.5 - 5.2 g/dL   BUN 26 (H) 6 - 23 mg/dL   Creatinine, Ser 1.51 (H) 0.40 - 1.50  mg/dL   Glucose, Bld 195 (H) 70 - 99 mg/dL   Phosphorus 3.7 2.3 - 4.6 mg/dL   GFR 44.43 (L) >60.00 mL/min   Calcium 10.5 8.4 - 10.5 mg/dL   US AORTA DUPLEX LIMITED CLINICAL DATA:  Abdominal aortic aneurysm by x-ray of the lumbar spine.  EXAM: ULTRASOUND OF ABDOMINAL AORTA  TECHNIQUE: Ultrasound examination of the abdominal aorta and proximal common iliac arteries was performed to evaluate for aneurysm. Additional color and Doppler images of the distal aorta were obtained to document patency.  COMPARISON:  None.  FINDINGS: Abdominal aortic measurements as follows:  Proximal:  3.2-3.4 cm  Mid:  2.8-3.2 cm  Distal:  4.9-5.0 cm Patent: Yes, peak systolic velocity is 563 cm/s  Distal abdominal aortic aneurysm measuring approximately 5 cm in greatest diameter by ultrasound. This extends to the aortic bifurcation.  IMPRESSION: 5 cm distal abdominal aortic aneurysm. Given aneurysm size, referral to vascular surgery is recommended. Consider further anatomic delineation of aneurysm morphology and more exact measurements utilizing CTA of the abdomen and pelvis.  Electronically Signed   By: Aletta Edouard M.D.   On: 04/02/2020 13:48 DG Lumbar Spine Complete CLINICAL DATA:  Left leg weakness. Sciatic pain.  EXAM: LUMBAR SPINE - COMPLETE 4+ VIEW  COMPARISON:  CT myelogram 08/19/2018.  FINDINGS: Technically limited exam due to soft tissue attenuation from habitus. Trace anterolisthesis of L5 on S1. The L5 pars defects on prior CT are not appreciated by radiograph. Diffuse disc space narrowing and endplate spurring with vacuum phenomenon at multiple levels. Diffuse multilevel facet hypertrophy. No evidence of compression fracture. Spinal stimulator with tips not included in the field of view. The sacroiliac joints are  congruent. Rounded 2.3 cm calcification in the right upper quadrant may represent gallstone. There is aortic atherosclerosis. The aortic calcifications are aneurysmal, also seen on prior CT, only partially included.  IMPRESSION: 1. Diffuse degenerative disc disease and facet hypertrophy. 2. Trace anterolisthesis of L5 on S1. The L5 pars defects on prior CT are not appreciated by radiograph. 3. Rounded 2.3 cm calcification in the right upper quadrant may represent a gallstone. 4. Aortic atherosclerosis. Calcified aortic aneurysm measuring at least 6.2 cm, not well assessed by radiograph. This measures at 4.5 cm on 2019 CT. Recommend dedicated aortic imaging as well as referral to a vascular specialist. This recommendation follows ACR consensus guidelines: White Paper of the ACR Incidental Findings Committee II on Vascular Findings. J Am Coll Radiol 2013; 10:789-794.  These results will be called to the ordering clinician or representative by the Radiologist Assistant, and communication documented in the PACS or [employer].  Electronically Signed   By: Keith Rake M.D.   On: 04/02/2020 11:04   Assessment & Plan:  This visit occurred during the SARS-CoV-2 public health emergency.  Safety protocols were in place, including screening questions prior to the visit, additional usage of staff PPE, and extensive cleaning of exam room while observing appropriate contact time as indicated for disinfecting solutions.   Problem List Items Addressed This Visit    Spondylolisthesis of lumbar region   Relevant Orders   MR LUMBAR SPINE W CONTRAST   Spinal stenosis   Relevant Orders   MR LUMBAR SPINE W CONTRAST   S/P insertion of spinal cord stimulator   Relevant Orders   MR LUMBAR SPINE W CONTRAST   Left leg pain - Primary    With weakness noted to L knee flexors on exam.  Anticipate possible L lumbar radiculopathy. Check lumbar films to  start, consider MRI vs NCS for further  evaluation. Tried and failed PT for this. Pt agrees with plan.  With prior history of lumbar surgeries will likely need contrasted MRI study - I did ask him to come in on Monday for labwork in anticipation of possible contrasted MRI.       Relevant Orders   DG Lumbar Spine Complete (Completed)   MR LUMBAR SPINE W CONTRAST   General unsteadiness   Relevant Orders   MR LUMBAR SPINE W CONTRAST   Essential hypertension    Chronic, deteriorated ?pain related. With newly noted increasing AAA size, reviewed need for tight BP control - will start amlodipine, advised start monitoring regularly at home and update me with effect in 2 wks.       Relevant Medications   lisinopril (ZESTRIL) 20 MG tablet   amLODipine (NORVASC) 5 MG tablet   Encounter for chronic pain management    [place] CSRS reviewed.       Abdominal aortic aneurysm (AAA) without rupture ([place])    Incidentally noted concern for enlarging AAA on lumbar films - proceeded with STAT AAA vascular US - showing 5cm distal AAA. Will refer to vascular to follow along - it seems he previously was lost to f/u. Will need tight BP control - see above.      Relevant Medications   lisinopril (ZESTRIL) 20 MG tablet   amLODipine (NORVASC) 5 MG tablet   Other Relevant Orders   US AORTA DUPLEX LIMITED (Completed)    Other Visit Diagnoses    Left leg weakness       Relevant Orders   MR LUMBAR SPINE W CONTRAST       Meds ordered this encounter  Medications  . lisinopril (ZESTRIL) 20 MG tablet    Sig: Take 1 tablet (20 mg total) by mouth daily.    Dispense:  90 tablet    Refill:  1    Note new sig  . amLODipine (NORVASC) 5 MG tablet    Sig: Take 1 tablet (5 mg total) by mouth daily.    Dispense:  30 tablet    Refill:  6   Orders Placed This Encounter  Procedures  . DG Lumbar Spine Complete    Standing Status:   Future    Number of Occurrences:   1    Standing Expiration Date:   04/02/2021    Order Specific Question:   Reason for Exam  (SYMPTOM  OR DIAGNOSIS REQUIRED)    Answer:   L leg weakness, sciatic pain    Order Specific Question:   Preferred imaging location?    Answer:   [facility]    Order Specific Question:   Radiology Contrast Protocol - do NOT remove file path    Answer:   \\[email or web address]  . US AORTA DUPLEX LIMITED    Standing Status:   Future    Number of Occurrences:   1    Standing Expiration Date:   04/02/2021    Order Specific Question:   Reason for Exam (SYMPTOM  OR DIAGNOSIS REQUIRED)    Answer:   Abdominal aortic aneurysm (AAA) without rupture ([place]) [I71.4 (ICD-10-CM)]    Order Specific Question:   Preferred imaging location?    Answer:   [facility]    Order Specific Question:   Call Results- Best Contact Number?    Answer:   [phone] before 5pm, after 5pm please call Dr Danise Mina at [phone]  . MR LUMBAR SPINE W CONTRAST  Standing Status:   Future    Standing Expiration Date:   04/05/2021    Order Specific Question:   If indicated for the ordered procedure, I authorize the administration of contrast media per Radiology protocol    Answer:   Yes    Order Specific Question:   What is the patient's sedation requirement?    Answer:   No Sedation    Order Specific Question:   Does the patient have a pacemaker or implanted devices?    Answer:   Yes    Order Specific Question:   Manufacturer of pacemake or implanted device?    Answer:   spinal cord stimulator    Order Specific Question:   Radiology Contrast Protocol - do NOT remove file path    Answer:   \\[email or web address]    Order Specific Question:   Preferred imaging location?    Answer:   [facility] (table limit-350lbs)    Patient Instructions   You have some new left leg weakness with your pain.  Xrays of lower back today. We may try to get you set up for a nerve study pending results of xray.  Continue tizanidine and aleve as up to now.   Blood pressure is up today - change lisinopril to 20mg  daily in the morning, add amlodipine 5mg  daily  Return in 1 month for follow up   Follow up plan: Return in about 4 weeks (around 04/30/2020) for follow up visit.  Ria Bush, MD

## 2020-04-02 NOTE — Telephone Encounter (Signed)
I have placed VVS order as needs to get established

## 2020-04-05 ENCOUNTER — Other Ambulatory Visit (INDEPENDENT_AMBULATORY_CARE_PROVIDER_SITE_OTHER): Payer: Medicare Other

## 2020-04-05 ENCOUNTER — Other Ambulatory Visit: Payer: Self-pay | Admitting: Family Medicine

## 2020-04-05 ENCOUNTER — Other Ambulatory Visit: Payer: Self-pay

## 2020-04-05 DIAGNOSIS — N1832 Chronic kidney disease, stage 3b: Secondary | ICD-10-CM | POA: Diagnosis not present

## 2020-04-05 LAB — RENAL FUNCTION PANEL
Albumin: 4 g/dL (ref 3.5–5.2)
BUN: 25 mg/dL — ABNORMAL HIGH (ref 6–23)
CO2: 27 mEq/L (ref 19–32)
Calcium: 10.2 mg/dL (ref 8.4–10.5)
Chloride: 103 mEq/L (ref 96–112)
Creatinine, Ser: 1.8 mg/dL — ABNORMAL HIGH (ref 0.40–1.50)
GFR: 36.26 mL/min — ABNORMAL LOW (ref >60.00)
Glucose, Bld: 140 mg/dL — ABNORMAL HIGH (ref 70–99)
Phosphorus: 3.3 mg/dL (ref 2.3–4.6)
Potassium: 4.2 mEq/L (ref 3.5–5.1)
Sodium: 138 mEq/L (ref 135–145)

## 2020-04-05 LAB — CBC WITH DIFFERENTIAL/PLATELET
Basophils Absolute: 0.2 10*3/uL — ABNORMAL HIGH (ref 0.0–0.1)
Basophils Relative: 2.6 % (ref 0.0–3.0)
Eosinophils Absolute: 0.9 10*3/uL — ABNORMAL HIGH (ref 0.0–0.7)
Eosinophils Relative: 9.5 % — ABNORMAL HIGH (ref 0.0–5.0)
HCT: 39.3 % (ref 39.0–52.0)
Hemoglobin: 13.2 g/dL (ref 13.0–17.0)
Lymphocytes Relative: 32.6 % (ref 12.0–46.0)
Lymphs Abs: 2.9 10*3/uL (ref 0.7–4.0)
MCHC: 33.5 g/dL (ref 30.0–36.0)
MCV: 94.1 fl (ref 78.0–100.0)
Monocytes Absolute: 0.9 10*3/uL (ref 0.1–1.0)
Monocytes Relative: 9.5 % (ref 3.0–12.0)
Neutro Abs: 4.1 10*3/uL (ref 1.4–7.7)
Neutrophils Relative %: 45.8 % (ref 43.0–77.0)
Platelets: 315 10*3/uL (ref 150.0–400.0)
RBC: 4.18 Mil/uL — ABNORMAL LOW (ref 4.22–5.81)
RDW: 15 % (ref 11.5–15.5)
WBC: 9 10*3/uL (ref 4.0–10.5)

## 2020-04-05 NOTE — Assessment & Plan Note (Signed)
Chronic, deteriorated ?pain related. With newly noted increasing AAA size, reviewed need for tight BP control - will start amlodipine, advised start monitoring regularly at home and update me with effect in 2 wks.

## 2020-04-05 NOTE — Addendum Note (Signed)
Addended by: Ria Bush on: 04/05/2020 02:00 PM   Modules accepted: Orders

## 2020-04-05 NOTE — Assessment & Plan Note (Signed)
Incidentally noted concern for enlarging AAA on lumbar films - proceeded with STAT AAA vascular US - showing 5cm distal AAA. Will refer to vascular to follow along - it seems he previously was lost to f/u. Will need tight BP control - see above.

## 2020-04-05 NOTE — Assessment & Plan Note (Signed)
[place] CSRS reviewed  ?

## 2020-04-05 NOTE — Assessment & Plan Note (Addendum)
With weakness noted to L knee flexors on exam.  Anticipate possible L lumbar radiculopathy. Check lumbar films to start, consider MRI vs NCS for further evaluation. Tried and failed PT for this. Pt agrees with plan.  With prior history of lumbar surgeries will likely need contrasted MRI study - I did ask him to come in on Monday for labwork in anticipation of possible contrasted MRI.

## 2020-04-06 ENCOUNTER — Ambulatory Visit (INDEPENDENT_AMBULATORY_CARE_PROVIDER_SITE_OTHER): Payer: Medicare Other | Admitting: Vascular Surgery

## 2020-04-06 ENCOUNTER — Encounter (INDEPENDENT_AMBULATORY_CARE_PROVIDER_SITE_OTHER): Payer: Self-pay | Admitting: Vascular Surgery

## 2020-04-06 VITALS — BP 166/113 | HR 75 | Resp 16 | Ht 72.0 in | Wt 295.0 lb

## 2020-04-06 DIAGNOSIS — I83813 Varicose veins of bilateral lower extremities with pain: Secondary | ICD-10-CM | POA: Diagnosis not present

## 2020-04-06 DIAGNOSIS — I714 Abdominal aortic aneurysm, without rupture, unspecified: Secondary | ICD-10-CM

## 2020-04-06 DIAGNOSIS — I10 Essential (primary) hypertension: Secondary | ICD-10-CM

## 2020-04-06 DIAGNOSIS — N1832 Chronic kidney disease, stage 3b: Secondary | ICD-10-CM | POA: Diagnosis not present

## 2020-04-06 DIAGNOSIS — I2510 Atherosclerotic heart disease of native coronary artery without angina pectoris: Secondary | ICD-10-CM | POA: Diagnosis not present

## 2020-04-06 DIAGNOSIS — I713 Abdominal aortic aneurysm, ruptured, unspecified: Secondary | ICD-10-CM

## 2020-04-06 NOTE — H&P (View-Only) (Signed)
Patient ID: Anthony [name], male   DOB: 1936-07-02, 84 y.o.   MRN: [id number]  Chief Complaint  Patient presents with  . New Patient (Initial Visit)    ref Gitierrez stat  AAA    HPI Anthony [name] is a 84 y.o. male.  I am asked to see the patient by Dr. Danise Mina for evaluation of an abdominal aortic aneurysm.  The patient had been previously referred for varicose veins which are still a bothersome problem creating [name] and swelling in his legs, but a recent abdominal aortic ultrasound was performed demonstrating a 5 cm abdominal aortic aneurysm.  I have reviewed the study.  He does report a family history of aneurysms and a brother who had an aneurysm repair some years ago.  He has not had any focal symptoms of his abdominal aortic aneurysm. Specifically, the patient denies new back or abdominal [name], or signs of peripheral embolization.     Past Medical History:  Diagnosis Date  . Anosmia 1980s  . Atrial flutter ([place]) 2018   had an ablation with dr. Erick Alley  . CAD (coronary artery disease)   . Cancer ([place])    skin cancer  . Chronic constipation   . Chronic insomnia   . Chronic lower back [name]    s/p spine stimulator placement  . Complication of anesthesia    when under general, he woke up agitated and unable to be held down  . Diastolic CHF ([place])   . Dyspnea    had prior to having cardiac stent placed  . History of diverticulitis 2017  . History of pneumonia 2013  . Hyperlipidemia   . Hypertension   . Hypothyroidism   . Obesity, Class II, BMI 35-39.9, with comorbidity   . OSA on CPAP     Past Surgical History:  Procedure Laterality Date  . A-FLUTTER ABLATION N/A 05/07/2017   Procedure: A-Flutter Ablation;  Surgeon: Deboraha Sprang, MD;  Location: [facility];  Service: Cardiovascular;  Laterality: N/A;  . CARDIOVASCULAR STRESS TEST  11/2017   no ischemia, low risk study  . COLONOSCOPY  2007   normal per prior PCP records, rpt 10 yrs (Dr Osie Cheeks)  . ELBOW  SURGERY Right    ulnar nerve decompression.  PT DOES NOT RECALL THIS PROCEDURE  . LAMINECTOMY THORACIC SPINE W/ PLACEMENT SPINAL CORD STIMULATOR  08/2018   and removal of prior spine stimulator (Dr Lacinda Axon)  . NASAL SINUS SURGERY     nasal polyps. done a long time ago  . PERCUTANEOUS CORONARY STENT INTERVENTION (PCI-S)  2013   EF55%, 70% mid LAD, 99% mid RCA, mild MR, elev LVEDP, DES to mid LAD. RCA is nondominant  . RADIOFREQUENCY ABLATION  06/2017   lumbar region [facility])   . REPLACEMENT TOTAL KNEE BILATERAL Bilateral 2000s  . SPINAL CORD STIMULATOR IMPLANT  06/2016  . SPINAL CORD STIMULATOR INSERTION N/A 09/09/2018   Procedure: LUMBAR SPINAL CORD STIMULATOR LEAD AND BATTERY REMOVAL AND LAMINECTOMY FOR PLACEMENT OF PADDLE;  Surgeon: Deetta Perla, MD;  Location: [facility];  Service: Neurosurgery;  Laterality: N/A;  . SPINAL CORD STIMULATOR INSERTION N/A 09/16/2018   Procedure: PLACEMENT OF SPINAL CORD STIMULATOR BATTERY;  Surgeon: Deetta Perla, MD;  Location: [facility];  Service: Neurosurgery;  Laterality: N/A;     Family History  Problem Relation Age of Onset  . Stroke Mother   . Kidney disease Father   . Leukemia Brother   . Stroke Sister   . Diabetes Neg  Hx      Social History   Tobacco Use  . Smoking status: Never Smoker  . Smokeless tobacco: Never Used  Vaping Use  . Vaping Use: Never used  Substance Use Topics  . Alcohol use: No    Comment: QUIT DRINKING  . Drug use: No    Allergies  Allergen Reactions  . Questran [Cholestyramine]     Patient not aware of an allergy to this medicine.  . Atorvastatin     Muscle [name]  . Gemfibrozil Other (See Comments)    Muscle [name].  . Metformin And Related     Dizziness  . Rosuvastatin     Muscle [name]    Current Outpatient Medications  Medication Sig Dispense Refill  . acetaminophen (TYLENOL) 500 MG tablet Take 1 tablet (500 mg total) by mouth 2 (two) times daily as needed for moderate [name].    . Alirocumab (PRALUENT) 75  MG/ML SOAJ Inject 1 pen into the skin every 14 (fourteen) days. 2 pen 11  . amLODipine (NORVASC) 5 MG tablet Take 1 tablet (5 mg total) by mouth daily. 30 tablet 6  . aspirin EC 81 MG tablet Take 1 tablet (81 mg total) by mouth daily.    . Azelastine HCl 0.15 % SOLN U 1 TO 2 SPRAYS IEN BID    . Cholecalciferol (VITAMIN D3) 1000 units CAPS Take 1 capsule (1,000 Units total) by mouth daily. 30 capsule   . docusate sodium (COLACE) 100 MG capsule Take 100 mg by mouth 2 (two) times daily.    Marland Kitchen ezetimibe (ZETIA) 10 MG tablet TAKE 1 TABLET(10 MG) BY MOUTH DAILY 90 tablet 3  . fenofibrate 160 MG tablet TAKE 1 TABLET BY MOUTH EVERY DAY 90 tablet 3  . finasteride (PROSCAR) 5 MG tablet TAKE 1 TABLET(5 MG) BY MOUTH DAILY 90 tablet 1  . fluocinonide (LIDEX) 0.05 % external solution Apply 1 application topically 2 (two) times daily as needed. 60 mL 2  . furosemide (LASIX) 40 MG tablet TAKE 1 TABLET BY MOUTH EVERY DAY. TAKE AN EXTRA TABLET AS NEEDED FOR WEIGHT GAIN OF 3 LBS/DAY OR 5LBS/WEEK 135 tablet 3  . gabapentin (NEURONTIN) 300 MG capsule Take 2 capsules (600 mg total) by mouth at bedtime. With extra tablet during the day as needed 200 capsule 1  . HYDROcodone-acetaminophen (NORCO) 10-325 MG tablet Take 1 tablet by mouth every 8 (eight) hours as needed. 20 tablet 0  . Icosapent Ethyl (VASCEPA) 1 g CAPS Take 2 capsules (2 g total) by mouth 2 (two) times daily. 120 capsule 11  . ketoconazole (NIZORAL) 2 % cream Apply 1 application topically daily as needed (for dermatitits).     Marland Kitchen ketoconazole (NIZORAL) 2 % shampoo Apply topically as directed. Apply 3 times weekly 120 mL 2  . levothyroxine (SYNTHROID) 175 MCG tablet TAKE 1 TABLET(175 MCG) BY MOUTH DAILY BEFORE BREAKFAST 90 tablet 3  . LINZESS 290 MCG CAPS capsule TAKE 1 CAPSULE(290 MCG) BY MOUTH DAILY 30 MINUTES BEFORE FIRST MEAL OF THE DAY ON AN EMPTY STOMACH 90 capsule 1  . lisinopril (ZESTRIL) 20 MG tablet Take 1 tablet (20 mg total) by mouth daily. 90  tablet 1  . loratadine (CLARITIN) 10 MG tablet Take 1 tablet (10 mg total) by mouth daily. (Patient taking differently: Take 10 mg by mouth daily as needed. )    . Multiple Vitamins-Minerals (CENTRUM SILVER PO) Take 1 tablet by mouth daily.    . Multiple Vitamins-Minerals (PRESERVISION AREDS 2 PO) Take 1  tablet by mouth daily.    . nitroGLYCERIN (NITROSTAT) 0.4 MG SL tablet Place 1 tablet (0.4 mg total) under the tongue every 5 (five) minutes as needed for chest [name]. 25 tablet 0  . tamsulosin (FLOMAX) 0.4 MG CAPS capsule TAKE 2 CAPSULES(0.8 MG) BY MOUTH DAILY 180 capsule 1  . tiZANidine (ZANAFLEX) 2 MG tablet Take 1-2 tablets (2-4 mg total) by mouth 2 (two) times daily as needed for muscle spasms. 30 tablet 0   No current facility-administered medications for this visit.      REVIEW OF SYSTEMS (Negative unless checked)  Constitutional: [] Weight loss  [] Fever  [] Chills Cardiac: [] Chest [name]   [] Chest pressure   [] Palpitations   [] Shortness of breath when laying flat   [] Shortness of breath at rest   [] Shortness of breath with exertion. Vascular:  [] [name] in legs with walking   [] [name] in legs at rest   [] [name] in legs when laying flat   [] Claudication   [] [name] in feet when walking  [] [name] in feet at rest  [] [name] in feet when laying flat   [] History of DVT   [] Phlebitis   [x] Swelling in legs   [x] Varicose veins   [] Non-healing ulcers Pulmonary:   [] Uses home oxygen   [] Productive cough   [] Hemoptysis   [] Wheeze  [] COPD   [] Asthma Neurologic:  [] Dizziness  [] Blackouts   [] Seizures   [] History of stroke   [] History of TIA  [] Aphasia   [] Temporary blindness   [] Dysphagia   [] Weakness or numbness in arms   [] Weakness or numbness in legs Musculoskeletal:  [x] Arthritis   [] Joint swelling   [] Joint [name]   [] Low back [name] Hematologic:  [] Easy bruising  [] Easy bleeding   [] Hypercoagulable state   [] Anemic  [] Hepatitis Gastrointestinal:  [] Blood in stool   [] Vomiting blood  [] Gastroesophageal  reflux/heartburn   [] Abdominal [name] Genitourinary:  [] Chronic kidney disease   [] Difficult urination  [] Frequent urination  [] Burning with urination   [] Hematuria Skin:  [] Rashes   [] Ulcers   [] Wounds Psychological:  [] History of anxiety   []  History of major depression.    Physical Exam BP (!) 166/113 (BP Location: Right Arm)   Pulse 75   Resp 16   Ht 6' (1.829 m)   Wt 295 lb (133.8 kg)   BMI 40.01 kg/m  Gen:  WD/WN, NAD Head: [place]/AT, No temporalis wasting. Ear/Nose/Throat: Hearing grossly intact, nares w/o erythema or drainage, oropharynx w/o Erythema/Exudate Eyes: Conjunctiva clear, sclera non-icteric  Neck: trachea midline.  No JVD.  Pulmonary:  Good air movement, respirations not labored, no use of accessory muscles  Cardiac: RRR, no JVD Vascular:  Vessel Right Left  Radial Palpable Palpable                                   Gastrointestinal:. No masses, surgical incisions, or scars. Musculoskeletal: M/S 5/5 throughout.  Extremities without ischemic changes.  No deformity or atrophy. Trace LE edema. Neurologic: Sensation grossly intact in extremities.  Symmetrical.  Speech is fluent. Motor exam as listed above. Psychiatric: Judgment intact, Mood & affect appropriate for pt's clinical situation. Dermatologic: No rashes or ulcers noted.  No cellulitis or open wounds.    Radiology DG Lumbar Spine Complete  Result Date: 04/02/2020 CLINICAL DATA:  Left leg weakness. Sciatic [name]. EXAM: LUMBAR SPINE - COMPLETE 4+ VIEW COMPARISON:  CT myelogram 08/19/2018. FINDINGS: Technically limited exam due to soft tissue attenuation from habitus. Trace anterolisthesis  of L5 on S1. The L5 pars defects on prior CT are not appreciated by radiograph. Diffuse disc space narrowing and endplate spurring with vacuum phenomenon at multiple levels. Diffuse multilevel facet hypertrophy. No evidence of compression fracture. Spinal stimulator with tips not included in the field of view. The  sacroiliac joints are congruent. Rounded 2.3 cm calcification in the right upper quadrant may represent gallstone. There is aortic atherosclerosis. The aortic calcifications are aneurysmal, also seen on prior CT, only partially included. IMPRESSION: 1. Diffuse degenerative disc disease and facet hypertrophy. 2. Trace anterolisthesis of L5 on S1. The L5 pars defects on prior CT are not appreciated by radiograph. 3. Rounded 2.3 cm calcification in the right upper quadrant may represent a gallstone. 4. Aortic atherosclerosis. Calcified aortic aneurysm measuring at least 6.2 cm, not well assessed by radiograph. This measures at 4.5 cm on 2019 CT. Recommend dedicated aortic imaging as well as referral to a vascular specialist. This recommendation follows ACR consensus guidelines: White Paper of the ACR Incidental Findings Committee II on Vascular Findings. J Am Coll Radiol 2013; 10:789-794. These results will be called to the ordering clinician or representative by the Radiologist Assistant, and communication documented in the PACS or [employer]. Electronically Signed   By: Keith Rake M.D.   On: 04/02/2020 11:04   US AORTA DUPLEX LIMITED  Result Date: 04/02/2020 CLINICAL DATA:  Abdominal aortic aneurysm by x-ray of the lumbar spine. EXAM: ULTRASOUND OF ABDOMINAL AORTA TECHNIQUE: Ultrasound examination of the abdominal aorta and proximal common iliac arteries was performed to evaluate for aneurysm. Additional color and Doppler images of the distal aorta were obtained to document patency. COMPARISON:  None. FINDINGS: Abdominal aortic measurements as follows: Proximal:  3.2-3.4 cm Mid:  2.8-3.2 cm Distal:  4.9-5.0 cm Patent: Yes, peak systolic velocity is 010 cm/s Distal abdominal aortic aneurysm measuring approximately 5 cm in greatest diameter by ultrasound. This extends to the aortic bifurcation. IMPRESSION: 5 cm distal abdominal aortic aneurysm. Given aneurysm size, referral to vascular surgery is  recommended. Consider further anatomic delineation of aneurysm morphology and more exact measurements utilizing CTA of the abdomen and pelvis. Electronically Signed   By: Aletta Edouard M.D.   On: 04/02/2020 13:48    Labs Recent Results (from the past 2160 hour(s))  Renal function panel     Status: Abnormal   Collection Time: 01/19/20  3:27 PM  Result Value Ref Range   Sodium 137 135 - 145 mEq/L   Potassium 4.3 3.5 - 5.1 mEq/L   Chloride 100 96 - 112 mEq/L   CO2 31 19 - 32 mEq/L   Albumin 4.2 3.5 - 5.2 g/dL   BUN 26 (H) 6 - 23 mg/dL   Creatinine, Ser 1.51 (H) 0.40 - 1.50 mg/dL   Glucose, Bld 195 (H) 70 - 99 mg/dL   Phosphorus 3.7 2.3 - 4.6 mg/dL   GFR 44.43 (L) >60.00 mL/min   Calcium 10.5 8.4 - 10.5 mg/dL  Renal function panel     Status: Abnormal   Collection Time: 04/05/20  2:03 PM  Result Value Ref Range   Sodium 138 135 - 145 mEq/L   Potassium 4.2 3.5 - 5.1 mEq/L   Chloride 103 96 - 112 mEq/L   CO2 27 19 - 32 mEq/L   Albumin 4.0 3.5 - 5.2 g/dL   BUN 25 (H) 6 - 23 mg/dL   Creatinine, Ser 1.80 (H) 0.40 - 1.50 mg/dL   Glucose, Bld 140 (H) 70 - 99 mg/dL  Phosphorus 3.3 2.3 - 4.6 mg/dL   GFR 36.26 (L) >60.00 mL/min   Calcium 10.2 8.4 - 10.5 mg/dL  CBC with Differential/Platelet     Status: Abnormal   Collection Time: 04/05/20  2:03 PM  Result Value Ref Range   WBC 9.0 4.0 - 10.5 K/uL   RBC 4.18 (L) 4.22 - 5.81 Mil/uL   Hemoglobin 13.2 13.0 - 17.0 g/dL   HCT 39.3 39 - 52 %   MCV 94.1 78.0 - 100.0 fl   MCHC 33.5 30.0 - 36.0 g/dL   RDW 15.0 11.5 - 15.5 %   Platelets 315.0 150 - 400 K/uL   Neutrophils Relative % 45.8 43 - 77 %   Lymphocytes Relative 32.6 12 - 46 %   Monocytes Relative 9.5 3 - 12 %   Eosinophils Relative 9.5 (H) 0 - 5 %   Basophils Relative 2.6 0 - 3 %   Neutro Abs 4.1 1.4 - 7.7 K/uL   Lymphs Abs 2.9 0.7 - 4.0 K/uL   Monocytes Absolute 0.9 0 - 1 K/uL   Eosinophils Absolute 0.9 (H) 0 - 0 K/uL   Basophils Absolute 0.2 (H) 0 - 0 K/uL     Assessment/Plan:  Essential hypertension blood pressure control important in reducing the progression of atherosclerotic disease and aneurysmal growth. On appropriate oral medications.   CKD (chronic kidney disease) stage 3, GFR 30-59 ml/min (HCC) Patient is advised to drink plenty and stay hydrated with his upcoming CT scan.  We will also hydrate him with his aneurysm repair if indeed he is a candidate for stent graft repair.  Abdominal aortic aneurysm (AAA) without rupture ([place]) The patient has an ultrasound demonstrating a 5.0 cm abdominal aortic aneurysm.  This is an appropriate sized to consider for repair.  At this point, the patient should undergo a CT angiogram of the abdomen pelvis to evaluate the anatomy and confirm the size of the aneurysm.  We discussed both open and endovascular repair and our preference for endovascular repair if his anatomy is suitable.  The patient will have the scan done in the next few days and we will make our surgical planning based on the scan.  I had a long discussion about the pathophysiology and natural history of abdominal aortic aneurysms.  The patient voices his understanding.  Varicose veins of leg with [name], bilateral Patient does describe [name] from his varicose veins.  We recommend a 20 to 30 mmHg compression stockings to be worn daily.  We discussed elevation and exercise.  This will take a backseat to his aneurysm for the time being, we can reassess this in a few months to discuss options for treatment and consideration for venous duplex.      Anthony [name] 04/06/2020, 5:01 PM   This note was created with Dragon medical transcription system.  Any errors from dictation are unintentional.

## 2020-04-06 NOTE — Assessment & Plan Note (Signed)
Patient does describe pain from his varicose veins.  We recommend a 20 to 30 mmHg compression stockings to be worn daily.  We discussed elevation and exercise.  This will take a backseat to his aneurysm for the time being, we can reassess this in a few months to discuss options for treatment and consideration for venous duplex.

## 2020-04-06 NOTE — Assessment & Plan Note (Signed)
Patient is advised to drink plenty and stay hydrated with his upcoming CT scan.  We will also hydrate him with his aneurysm repair if indeed he is a candidate for stent graft repair.

## 2020-04-06 NOTE — Patient Instructions (Signed)
Abdominal Aortic Aneurysm  An aneurysm is a bulge in one of the blood vessels that carry blood away from the heart (artery). It happens when blood pushes up against a weak or damaged place in the wall of an artery. An abdominal aortic aneurysm happens in the main artery of the body (aorta). Some aneurysms may not cause problems. If it grows, it can burst or tear, causing bleeding inside the body. This is an emergency. It needs to be treated right away. What are the causes? The exact cause of this condition is not known. What increases the risk? The following may make you more likely to get this condition:  Being a male who is 60 years of age or older.  Being white (Caucasian).  Using tobacco.  Having a family history of aneurysms.  Having the following conditions: ? Hardening of the arteries (arteriosclerosis). ? Inflammation of the walls of an artery (arteritis). ? Certain genetic conditions. ? Being very overweight (obesity). ? An infection in the wall of the aorta (infectious aortitis). ? High cholesterol. ? High blood pressure (hypertension). What are the signs or symptoms? Symptoms depend on the size of the aneurysm and how fast it is growing. Most grow slowly and do not cause any symptoms. If symptoms do occur, they may include:  Pain in the belly (abdomen), side, or back.  Feeling full after eating only small amounts of food.  Feeling a throbbing lump in the belly. Symptoms that the aneurysm has burst (ruptured) include:  Sudden, very bad pain in the belly, side, or back.  Feeling sick to your stomach (nauseous).  Throwing up (vomiting).  Feeling light-headed or passing out. How is this treated? Treatment for this condition depends on:  The size of the aneurysm.  How fast it is growing.  Your age.  Your risk of having it burst. If your aneurysm is smaller than 2 inches (5 cm), your doctor may manage it by:  Checking it often to see if it is getting bigger.  You may have an imaging test (ultrasound) to check it every 3-6 months, every year, or every few years.  Giving you medicines to: ? Control blood pressure. ? Treat pain. ? Fight infection. If your aneurysm is larger than 2 inches (5 cm), you may need surgery to fix it. Follow these instructions at home: Lifestyle  Do not use any products that have nicotine or tobacco in them. This includes cigarettes, e-cigarettes, and chewing tobacco. If you need help quitting, ask your doctor.  Get regular exercise. Ask your doctor what types of exercise are best for you. Eating and drinking  Eat a heart-healthy diet. This includes eating plenty of: ? Fresh fruits and vegetables. ? Whole grains. ? Low-fat (lean) protein. ? Low-fat dairy products.  Avoid foods that are high in saturated fat and cholesterol. These foods include red meat and some dairy products.  Do not drink alcohol if: ? Your doctor tells you not to drink. ? You are pregnant, may be pregnant, or are planning to become pregnant.  If you drink alcohol: ? Limit how much you use to:  0-1 drink a day for women.  0-2 drinks a day for men. ? Be aware of how much alcohol is in your drink. In the [place] one drink equals any of these:  One typical bottle of beer (12 oz).  One-half glass of wine (5 oz).  One shot of hard liquor (1 oz). General instructions  Take over-the-counter and prescription medicines only as   told by your doctor.  Keep your blood pressure within normal limits. Ask your doctor what your blood pressure should be.  Have your blood sugar (glucose) level and cholesterol levels checked regularly. Keep your blood sugar level and cholesterol levels within normal limits.  Avoid heavy lifting and activities that take a lot of effort. Ask your doctor what activities are safe for you.  Keep all follow-up visits as told by your doctor. This is important. ? Talk to your doctor about regular screenings to see if the  aneurysm is getting bigger. Contact a doctor if you:  Have pain in your belly, side, or back.  Have a throbbing feeling in your belly.  Have a family history of aneurysms. Get help right away if you:  Have sudden, bad pain in your belly, side, or back.  Feel sick to your stomach.  Throw up.  Have trouble pooping (constipation).  Have trouble peeing (urinating).  Feel light-headed.  Have a fast heart rate when you stand.  Have sweaty skin that is cold to the touch (clammy).  Have shortness of breath.  Have a fever. These symptoms may be an emergency. Do not wait to see if the symptoms will go away. Get medical help right away. Call your local emergency services (911 in the [place] Do not drive yourself to the hospital. Summary  An aneurysm is a bulge in one of the blood vessels that carry blood away from the heart (artery). Some aneurysms may not cause problems.  You may need to have yours checked often. If it grows, it can burst or tear. This causes bleeding inside the body. It needs to be treated right away.  Follow instructions from your doctor about healthy lifestyle changes.  Keep all follow-up visits as told by your doctor. This is important. This information is not intended to replace advice given to you by your health care provider. Make sure you discuss any questions you have with your health care provider. Document Revised: 12/02/2018 Document Reviewed: 03/23/2018 Elsevier Patient Education  2020 [employer]  

## 2020-04-06 NOTE — Assessment & Plan Note (Signed)
blood pressure control important in reducing the progression of atherosclerotic disease and aneurysmal growth. On appropriate oral medications.  

## 2020-04-06 NOTE — Progress Notes (Signed)
Patient ID: Anthony [name], male   DOB: [birth year], 84 y.o.   MRN: [id number]  Chief Complaint  Patient presents with  . New Patient (Initial Visit)    ref Gitierrez stat  AAA    HPI HUNT Anthony [name] is a 84 y.o. male.  I am asked to see the patient by Dr. Danise Mina for evaluation of an abdominal aortic aneurysm.  The patient had been previously referred for varicose veins which are still a bothersome problem creating pain and swelling in his legs, but a recent abdominal aortic ultrasound was performed demonstrating a 5 cm abdominal aortic aneurysm.  I have reviewed the study.  He does report a family history of aneurysms and a brother who had an aneurysm repair some years ago.  He has not had any focal symptoms of his abdominal aortic aneurysm. Specifically, the patient denies new back or abdominal pain, or signs of peripheral embolization.     Past Medical History:  Diagnosis Date  . Anosmia 1980s  . Atrial flutter ([place]) 2018   had an ablation with dr. Erick Alley  . CAD (coronary artery disease)   . Cancer ([place])    skin cancer  . Chronic constipation   . Chronic insomnia   . Chronic lower back pain    s/p spine stimulator placement  . Complication of anesthesia    when under general, he woke up agitated and unable to be held down  . Diastolic CHF ([place])   . Dyspnea    had prior to having cardiac stent placed  . History of diverticulitis 2017  . History of pneumonia 2013  . Hyperlipidemia   . Hypertension   . Hypothyroidism   . Obesity, Class II, BMI 35-39.9, with comorbidity   . OSA on CPAP     Past Surgical History:  Procedure Laterality Date  . A-FLUTTER ABLATION N/A 05/07/2017   Procedure: A-Flutter Ablation;  Surgeon: Deboraha Sprang, MD;  Location: [facility] [facility];  Service: Cardiovascular;  Laterality: N/A;  . CARDIOVASCULAR STRESS TEST  11/2017   no ischemia, low risk study  . COLONOSCOPY  2007   normal per prior PCP records, rpt 10 yrs (Dr Osie Cheeks)  . ELBOW  SURGERY Right    ulnar nerve decompression.  PT DOES NOT RECALL THIS PROCEDURE  . LAMINECTOMY THORACIC SPINE W/ PLACEMENT SPINAL CORD STIMULATOR  08/2018   and removal of prior spine stimulator (Dr Lacinda Axon)  . NASAL SINUS SURGERY     nasal polyps. done a long time ago  . PERCUTANEOUS CORONARY STENT INTERVENTION (PCI-S)  2013   EF55%, 70% mid LAD, 99% mid RCA, mild MR, elev LVEDP, DES to mid LAD. RCA is nondominant  . RADIOFREQUENCY ABLATION  06/2017   lumbar region [facility])   . REPLACEMENT TOTAL KNEE BILATERAL Bilateral 2000s  . SPINAL CORD STIMULATOR IMPLANT  06/2016  . SPINAL CORD STIMULATOR INSERTION N/A 09/09/2018   Procedure: LUMBAR SPINAL CORD STIMULATOR LEAD AND BATTERY REMOVAL AND LAMINECTOMY FOR PLACEMENT OF PADDLE;  Surgeon: Deetta Perla, MD;  Location: [facility];  Service: Neurosurgery;  Laterality: N/A;  . SPINAL CORD STIMULATOR INSERTION N/A 09/16/2018   Procedure: PLACEMENT OF SPINAL CORD STIMULATOR BATTERY;  Surgeon: Deetta Perla, MD;  Location: [facility];  Service: Neurosurgery;  Laterality: N/A;     Family History  Problem Relation Age of Onset  . Stroke Mother   . Kidney disease Father   . Leukemia Brother   . Stroke Sister   . Diabetes Neg  Hx      Social History   Tobacco Use  . Smoking status: Never Smoker  . Smokeless tobacco: Never Used  Vaping Use  . Vaping Use: Never used  Substance Use Topics  . Alcohol use: No    Comment: QUIT DRINKING  . Drug use: No    Allergies  Allergen Reactions  . Questran [Cholestyramine]     Patient not aware of an allergy to this medicine.  . Atorvastatin     Muscle pain  . Gemfibrozil Other (See Comments)    Muscle pain.  . Metformin And Related     Dizziness  . Rosuvastatin     Muscle pain    Current Outpatient Medications  Medication Sig Dispense Refill  . acetaminophen (TYLENOL) 500 MG tablet Take 1 tablet (500 mg total) by mouth 2 (two) times daily as needed for moderate pain.    . Alirocumab (PRALUENT) 75  MG/ML SOAJ Inject 1 pen into the skin every 14 (fourteen) days. 2 pen 11  . amLODipine (NORVASC) 5 MG tablet Take 1 tablet (5 mg total) by mouth daily. 30 tablet 6  . aspirin EC 81 MG tablet Take 1 tablet (81 mg total) by mouth daily.    . Azelastine HCl 0.15 % SOLN U 1 TO 2 SPRAYS IEN BID    . Cholecalciferol (VITAMIN D3) 1000 units CAPS Take 1 capsule (1,000 Units total) by mouth daily. 30 capsule   . docusate sodium (COLACE) 100 MG capsule Take 100 mg by mouth 2 (two) times daily.    Marland Kitchen ezetimibe (ZETIA) 10 MG tablet TAKE 1 TABLET(10 MG) BY MOUTH DAILY 90 tablet 3  . fenofibrate 160 MG tablet TAKE 1 TABLET BY MOUTH EVERY DAY 90 tablet 3  . finasteride (PROSCAR) 5 MG tablet TAKE 1 TABLET(5 MG) BY MOUTH DAILY 90 tablet 1  . fluocinonide (LIDEX) 0.05 % external solution Apply 1 application topically 2 (two) times daily as needed. 60 mL 2  . furosemide (LASIX) 40 MG tablet TAKE 1 TABLET BY MOUTH EVERY DAY. TAKE AN EXTRA TABLET AS NEEDED FOR WEIGHT GAIN OF 3 LBS/DAY OR 5LBS/WEEK 135 tablet 3  . gabapentin (NEURONTIN) 300 MG capsule Take 2 capsules (600 mg total) by mouth at bedtime. With extra tablet during the day as needed 200 capsule 1  . HYDROcodone-acetaminophen (NORCO) 10-325 MG tablet Take 1 tablet by mouth every 8 (eight) hours as needed. 20 tablet 0  . Icosapent Ethyl (VASCEPA) 1 g CAPS Take 2 capsules (2 g total) by mouth 2 (two) times daily. 120 capsule 11  . ketoconazole (NIZORAL) 2 % cream Apply 1 application topically daily as needed (for dermatitits).     Marland Kitchen ketoconazole (NIZORAL) 2 % shampoo Apply topically as directed. Apply 3 times weekly 120 mL 2  . levothyroxine (SYNTHROID) 175 MCG tablet TAKE 1 TABLET(175 MCG) BY MOUTH DAILY BEFORE BREAKFAST 90 tablet 3  . LINZESS 290 MCG CAPS capsule TAKE 1 CAPSULE(290 MCG) BY MOUTH DAILY 30 MINUTES BEFORE FIRST MEAL OF THE DAY ON AN EMPTY STOMACH 90 capsule 1  . lisinopril (ZESTRIL) 20 MG tablet Take 1 tablet (20 mg total) by mouth daily. 90  tablet 1  . loratadine (CLARITIN) 10 MG tablet Take 1 tablet (10 mg total) by mouth daily. (Patient taking differently: Take 10 mg by mouth daily as needed. )    . Multiple Vitamins-Minerals (CENTRUM SILVER PO) Take 1 tablet by mouth daily.    . Multiple Vitamins-Minerals (PRESERVISION AREDS 2 PO) Take 1  tablet by mouth daily.    . nitroGLYCERIN (NITROSTAT) 0.4 MG SL tablet Place 1 tablet (0.4 mg total) under the tongue every 5 (five) minutes as needed for chest pain. 25 tablet 0  . tamsulosin (FLOMAX) 0.4 MG CAPS capsule TAKE 2 CAPSULES(0.8 MG) BY MOUTH DAILY 180 capsule 1  . tiZANidine (ZANAFLEX) 2 MG tablet Take 1-2 tablets (2-4 mg total) by mouth 2 (two) times daily as needed for muscle spasms. 30 tablet 0   No current facility-administered medications for this visit.      REVIEW OF SYSTEMS (Negative unless checked)  Constitutional: [] Weight loss  [] Fever  [] Chills Cardiac: [] Chest pain   [] Chest pressure   [] Palpitations   [] Shortness of breath when laying flat   [] Shortness of breath at rest   [] Shortness of breath with exertion. Vascular:  [] Pain in legs with walking   [] Pain in legs at rest   [] Pain in legs when laying flat   [] Claudication   [] Pain in feet when walking  [] Pain in feet at rest  [] Pain in feet when laying flat   [] History of DVT   [] Phlebitis   [x] Swelling in legs   [x] Varicose veins   [] Non-healing ulcers Pulmonary:   [] Uses home oxygen   [] Productive cough   [] Hemoptysis   [] Wheeze  [] COPD   [] Asthma Neurologic:  [] Dizziness  [] Blackouts   [] Seizures   [] History of stroke   [] History of TIA  [] Aphasia   [] Temporary blindness   [] Dysphagia   [] Weakness or numbness in arms   [] Weakness or numbness in legs Musculoskeletal:  [x] Arthritis   [] Joint swelling   [] Joint pain   [] Low back pain Hematologic:  [] Easy bruising  [] Easy bleeding   [] Hypercoagulable state   [] Anemic  [] Hepatitis Gastrointestinal:  [] Blood in stool   [] Vomiting blood  [] Gastroesophageal  reflux/heartburn   [] Abdominal pain Genitourinary:  [] Chronic kidney disease   [] Difficult urination  [] Frequent urination  [] Burning with urination   [] Hematuria Skin:  [] Rashes   [] Ulcers   [] Wounds Psychological:  [] History of anxiety   []  History of major depression.    Physical Exam BP (!) 166/113 (BP Location: Right Arm)   Pulse 75   Resp 16   Ht 6' (1.829 m)   Wt 295 lb (133.8 kg)   BMI 40.01 kg/m  Gen:  WD/WN, NAD Head: [place]/AT, No temporalis wasting. Ear/Nose/Throat: Hearing grossly intact, nares w/o erythema or drainage, oropharynx w/o Erythema/Exudate Eyes: Conjunctiva clear, sclera non-icteric  Neck: trachea midline.  No JVD.  Pulmonary:  Good air movement, respirations not labored, no use of accessory muscles  Cardiac: RRR, no JVD Vascular:  Vessel Right Left  Radial Palpable Palpable                                   Gastrointestinal:. No masses, surgical incisions, or scars. Musculoskeletal: M/S 5/5 throughout.  Extremities without ischemic changes.  No deformity or atrophy. Trace LE edema. Neurologic: Sensation grossly intact in extremities.  Symmetrical.  Speech is fluent. Motor exam as listed above. Psychiatric: Judgment intact, Mood & affect appropriate for pt's clinical situation. Dermatologic: No rashes or ulcers noted.  No cellulitis or open wounds.    Radiology DG Lumbar Spine Complete  Result Date: 04/02/2020 CLINICAL DATA:  Left leg weakness. Sciatic pain. EXAM: LUMBAR SPINE - COMPLETE 4+ VIEW COMPARISON:  CT myelogram 08/19/2018. FINDINGS: Technically limited exam due to soft tissue attenuation from habitus. Trace anterolisthesis  of L5 on S1. The L5 pars defects on prior CT are not appreciated by radiograph. Diffuse disc space narrowing and endplate spurring with vacuum phenomenon at multiple levels. Diffuse multilevel facet hypertrophy. No evidence of compression fracture. Spinal stimulator with tips not included in the field of view. The  sacroiliac joints are congruent. Rounded 2.3 cm calcification in the right upper quadrant may represent gallstone. There is aortic atherosclerosis. The aortic calcifications are aneurysmal, also seen on prior CT, only partially included. IMPRESSION: 1. Diffuse degenerative disc disease and facet hypertrophy. 2. Trace anterolisthesis of L5 on S1. The L5 pars defects on prior CT are not appreciated by radiograph. 3. Rounded 2.3 cm calcification in the right upper quadrant may represent a gallstone. 4. Aortic atherosclerosis. Calcified aortic aneurysm measuring at least 6.2 cm, not well assessed by radiograph. This measures at 4.5 cm on 2019 CT. Recommend dedicated aortic imaging as well as referral to a vascular specialist. This recommendation follows ACR consensus guidelines: White Paper of the ACR Incidental Findings Committee II on Vascular Findings. J Am Coll Radiol 2013; 10:789-794. These results will be called to the ordering clinician or representative by the Radiologist Assistant, and communication documented in the PACS or [employer]. Electronically Signed   By: Keith Rake M.D.   On: 04/02/2020 11:04   US AORTA DUPLEX LIMITED  Result Date: 04/02/2020 CLINICAL DATA:  Abdominal aortic aneurysm by x-ray of the lumbar spine. EXAM: ULTRASOUND OF ABDOMINAL AORTA TECHNIQUE: Ultrasound examination of the abdominal aorta and proximal common iliac arteries was performed to evaluate for aneurysm. Additional color and Doppler images of the distal aorta were obtained to document patency. COMPARISON:  None. FINDINGS: Abdominal aortic measurements as follows: Proximal:  3.2-3.4 cm Mid:  2.8-3.2 cm Distal:  4.9-5.0 cm Patent: Yes, peak systolic velocity is 979 cm/s Distal abdominal aortic aneurysm measuring approximately 5 cm in greatest diameter by ultrasound. This extends to the aortic bifurcation. IMPRESSION: 5 cm distal abdominal aortic aneurysm. Given aneurysm size, referral to vascular surgery is  recommended. Consider further anatomic delineation of aneurysm morphology and more exact measurements utilizing CTA of the abdomen and pelvis. Electronically Signed   By: Aletta Edouard M.D.   On: 04/02/2020 13:48    Labs Recent Results (from the past 2160 hour(s))  Renal function panel     Status: Abnormal   Collection Time: 01/19/20  3:27 PM  Result Value Ref Range   Sodium 137 135 - 145 mEq/L   Potassium 4.3 3.5 - 5.1 mEq/L   Chloride 100 96 - 112 mEq/L   CO2 31 19 - 32 mEq/L   Albumin 4.2 3.5 - 5.2 g/dL   BUN 26 (H) 6 - 23 mg/dL   Creatinine, Ser 1.51 (H) 0.40 - 1.50 mg/dL   Glucose, Bld 195 (H) 70 - 99 mg/dL   Phosphorus 3.7 2.3 - 4.6 mg/dL   GFR 44.43 (L) >60.00 mL/min   Calcium 10.5 8.4 - 10.5 mg/dL  Renal function panel     Status: Abnormal   Collection Time: 04/05/20  2:03 PM  Result Value Ref Range   Sodium 138 135 - 145 mEq/L   Potassium 4.2 3.5 - 5.1 mEq/L   Chloride 103 96 - 112 mEq/L   CO2 27 19 - 32 mEq/L   Albumin 4.0 3.5 - 5.2 g/dL   BUN 25 (H) 6 - 23 mg/dL   Creatinine, Ser 1.80 (H) 0.40 - 1.50 mg/dL   Glucose, Bld 140 (H) 70 - 99 mg/dL  Phosphorus 3.3 2.3 - 4.6 mg/dL   GFR 36.26 (L) >60.00 mL/min   Calcium 10.2 8.4 - 10.5 mg/dL  CBC with Differential/Platelet     Status: Abnormal   Collection Time: 04/05/20  2:03 PM  Result Value Ref Range   WBC 9.0 4.0 - 10.5 K/uL   RBC 4.18 (L) 4.22 - 5.81 Mil/uL   Hemoglobin 13.2 13.0 - 17.0 g/dL   HCT 39.3 39 - 52 %   MCV 94.1 78.0 - 100.0 fl   MCHC 33.5 30.0 - 36.0 g/dL   RDW 15.0 11.5 - 15.5 %   Platelets 315.0 150 - 400 K/uL   Neutrophils Relative % 45.8 43 - 77 %   Lymphocytes Relative 32.6 12 - 46 %   Monocytes Relative 9.5 3 - 12 %   Eosinophils Relative 9.5 (H) 0 - 5 %   Basophils Relative 2.6 0 - 3 %   Neutro Abs 4.1 1.4 - 7.7 K/uL   Lymphs Abs 2.9 0.7 - 4.0 K/uL   Monocytes Absolute 0.9 0 - 1 K/uL   Eosinophils Absolute 0.9 (H) 0 - 0 K/uL   Basophils Absolute 0.2 (H) 0 - 0 K/uL     Assessment/Plan:  Essential hypertension blood pressure control important in reducing the progression of atherosclerotic disease and aneurysmal growth. On appropriate oral medications.   CKD (chronic kidney disease) stage 3, GFR 30-59 ml/min (HCC) Patient is advised to drink plenty and stay hydrated with his upcoming CT scan.  We will also hydrate him with his aneurysm repair if indeed he is a candidate for stent graft repair.  Abdominal aortic aneurysm (AAA) without rupture ([place]) The patient has an ultrasound demonstrating a 5.0 cm abdominal aortic aneurysm.  This is an appropriate sized to consider for repair.  At this point, the patient should undergo a CT angiogram of the abdomen pelvis to evaluate the anatomy and confirm the size of the aneurysm.  We discussed both open and endovascular repair and our preference for endovascular repair if his anatomy is suitable.  The patient will have the scan done in the next few days and we will make our surgical planning based on the scan.  I had a long discussion about the pathophysiology and natural history of abdominal aortic aneurysms.  The patient voices his understanding.  Varicose veins of leg with pain, bilateral Patient does describe pain from his varicose veins.  We recommend a 20 to 30 mmHg compression stockings to be worn daily.  We discussed elevation and exercise.  This will take a backseat to his aneurysm for the time being, we can reassess this in a few months to discuss options for treatment and consideration for venous duplex.      Leotis Pain 04/06/2020, 5:01 PM   This note was created with Dragon medical transcription system.  Any errors from dictation are unintentional.

## 2020-04-06 NOTE — Assessment & Plan Note (Signed)
The patient has an ultrasound demonstrating a 5.0 cm abdominal aortic aneurysm.  This is an appropriate sized to consider for repair.  At this point, the patient should undergo a CT angiogram of the abdomen pelvis to evaluate the anatomy and confirm the size of the aneurysm.  We discussed both open and endovascular repair and our preference for endovascular repair if his anatomy is suitable.  The patient will have the scan done in the next few days and we will make our surgical planning based on the scan.  I had a long discussion about the pathophysiology and natural history of abdominal aortic aneurysms.  The patient voices his understanding.

## 2020-04-08 ENCOUNTER — Encounter (INDEPENDENT_AMBULATORY_CARE_PROVIDER_SITE_OTHER): Payer: Self-pay

## 2020-04-09 ENCOUNTER — Other Ambulatory Visit: Payer: Medicare Other

## 2020-04-09 ENCOUNTER — Ambulatory Visit: Payer: Medicare Other

## 2020-04-09 ENCOUNTER — Other Ambulatory Visit: Payer: Self-pay

## 2020-04-09 ENCOUNTER — Ambulatory Visit
Admission: RE | Admit: 2020-04-09 | Discharge: 2020-04-09 | Disposition: A | Discharge status: Home / Self Care | Payer: Medicare Other | Source: Ambulatory Visit | Attending: Vascular Surgery | Admitting: Vascular Surgery

## 2020-04-09 DIAGNOSIS — I713 Abdominal aortic aneurysm, ruptured, unspecified: Secondary | ICD-10-CM

## 2020-04-09 DIAGNOSIS — N281 Cyst of kidney, acquired: Secondary | ICD-10-CM | POA: Diagnosis not present

## 2020-04-09 DIAGNOSIS — I714 Abdominal aortic aneurysm, without rupture: Secondary | ICD-10-CM | POA: Diagnosis not present

## 2020-04-09 DIAGNOSIS — I722 Aneurysm of renal artery: Secondary | ICD-10-CM | POA: Diagnosis not present

## 2020-04-09 DIAGNOSIS — I700 Atherosclerosis of aorta: Secondary | ICD-10-CM | POA: Diagnosis not present

## 2020-04-09 MED ORDER — IOHEXOL 350 MG/ML SOLN
75.0000 mL | Freq: Once | INTRAVENOUS | Status: AC | PRN
  Administered 2020-04-09: 75 mL via INTRAVENOUS

## 2020-04-11 ENCOUNTER — Encounter: Payer: Self-pay | Admitting: Family Medicine

## 2020-04-11 DIAGNOSIS — D721 Eosinophilia, unspecified: Secondary | ICD-10-CM | POA: Insufficient documentation

## 2020-04-12 NOTE — Chronic Care Management (AMB) (Signed)
Chronic Care Management Pharmacy  Name: KIMBALL APPLEBY  MRN: [id number] DOB: Jan 18, 1936  Chief Complaint/ HPI  [name],  84 y.o., male contacted by telephone for CCM follow up visit.   PCP : Ria Bush, MD  Their chronic conditions include: hypercholesterolemia, major depressive disorder, constipation, chronic pain, history of atrial flutter, coronary artery disease (stent placed 2013), hypertension, diastolic heart failure, hypothyroidism, sleep apnea, essential tremor, CKD, BPH, vitamin D deficiency, seasonal allergies  Patient concerns: denies medication concerns   Office Visits:   04/02/2020 - complaint of leg pain and elevated home bp. Start amlodipine for tight bp control due to elevated AAA size. Referral for MRI of back due to leg pain.   Consult Visits:  04/06/20: Vascular Surgery - consider repair for 5 cm abdominal aortic aneurysm. Recommend 20-30 mmHg compression stockings daily. Hydration recommended prior to CT.   10/06/19: Hypercholesterolemia - note not available  06/24/19: Hypercholesterolemia - start Praluent injection once every 14 days   Current Diagnosis/Assessment: Goals    . Increase physical activity     Starting 10/24/2018, I will continue to exercise for at least 45 minutes daily.     . Patient Stated     10/30/2019, I will continue to walk for 1 hour everyday.     Marland Kitchen Pharmacy Care Plan     Current Barriers:  . Chronic Disease Management support, education, and care coordination needs related to neuropathic pain, hypertension, heart failure  Pharmacist Clinical Goal(s):  Marland Kitchen Hypertension: Maintain blood pressure < 140/90 mmHg o Continue to check blood pressure at home with son or daughter; Call if BP is consistently above goal. Assess lab work due to recent  lisinopril dose increase. Marland Kitchen Heart Failure: Prevent shortness of breath and fluid overload o Continue furosemide every day with an additional tablet as needed for swelling . Neuropathic  pain: Maintain neuropathic pain control o Continue gabapentin 600 mg at bedtime - may take an additional tablet in the morning if needed. Continue Tylenol 500 mg twice daily and hydrocodone for severe pain.   . Weight loss goals: Goal weight < 200 lbs . Vaccinations: Remain up to date on vaccinations.  o Recommend new shingles vaccines (Shingrix).   Interventions: . Comprehensive medication review performed. . Discussed patient's decision on pursuing abdominal aortic aneurysm repair and clearance by cardiology. Patient plans to call cardiology to schedule an appointment.   Patient Self Care Activities:  . Check with local pharmacy for shingles vaccine . Try increasing gabapentin by adding 1 capsule at breakfast or lunch for nerve pain . Continue furosemide for swelling and take an additional tablet if you notice weight gain of > 3 pounds overnight  . Continue lisinopril 20 mg daily and amlodipine 5 mg daily for blood pressure.  . Consider mediterranean diet for weight loss   Please see past updates related to this goal by clicking on the "Past Updates" button in the selected goal       Diet/Weight Loss   Patient has failed these meds in past: naltrexone/bupropion (ineffective) Patient is currently on the following medications:   No pharmacotherapy  We discussed: Patient reports no longer taking naltrexone or bupropion, both ineffective. Patient experienced weight gain after initiation. 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   Current weight: (prior to starting weight loss medication) 283 lbs --> 290 lbs (today) Goal weight: 200 lbs  Update 04/16/2020 - Walking at the [place] most days. Patient has reduced his portion  sizes and does not eat after 4-5 pm each day in an efforts to lose weight. Eats breakfast and a late afternoon meal.   Plan: Recommend heart healthy diet for weight loss; Continue exercise.   BPH   Patient has failed these meds in past: none Patient is currently controlled on the following medications:   Tamsulosin 0.4 mg - 1 capsule daily   Finasteride 5 mg - 1 tablet daily  We discussed: reports symptoms stable; restarted 1 tamsulosin daily since last visit due to BP elevation   Plan: Continue current medications  Neuropathy/Chronic pain   Symptoms: nerve pain down leg to foot, dull and aching back pain Patient has failed these meds in past: tramadol - not effective, tizanidine - not effective Patient is currently controlled on the following medications:   Gabapentin 300 mg - take 2 capsules at bedtime   Hydrocodone/acetaminophen 10-325 mg - 1 tablet BID PRN   Tylenol 500 mg - 1 tablet twice daily   CrCl: 53 ml/min (Scr: 1.56 10/30/19, adjusted body weight:102 kg) We discussed: patient reports pain is well controlled, only taking hydrocodone a couple days a week for back pain, continues gabapentin 2 capsules at bedtime for nerve pain   Bowel regimen:   Linzess 290 mcg - 1 capsule daily 30 minutes before first meal of the day   Docusate 100 mg - 1 tablet BID  Plan: Continue current medications  Insomnia    Patient has failed these meds in past: Lunesta - grogginess, trazodone - ineffective Patient is currently controlled on the following medications:   Tylenol PM extra strength 25-500 mg - 2 tablet daily at bedtime  Melatonin 10 mg - 1 tablet qhs   We discussed:  Reports sleeping well, continues melatonin and Tylenol PM. Has increased Tylenol PM back to every night. Reports unable to sleep without it. Failed other therapies and has not changed sleep routines (screen use, bedtime, etc). Continues to see benefit on sleep from gabapentin/pain relief.   Plan: Continue  current medications  Hyperlipidemia/CAD  Followed by cardiology  Lipid Panel     Component Value Date/Time   CHOL 102 10/06/2019 1031   TRIG 184 (H) 10/06/2019 1031   HDL 41 10/06/2019 1031   CHOLHDL 2.5 10/06/2019 1031   VLDL 37 10/06/2019 1031   LDLCALC 24 10/06/2019 1031   LDLDIRECT 117.0 10/24/2018 1115    CAD: LDL goal < 70 Patient has failed these meds in past: intolerance to statins, cholestyramine, and gemfibrozil (myalgias) Patient is currently controlled on the following medications:   Praluent 75 mg/mL - Inject every 14 days  Ezetimibe 10 mg - 1 tablet daily  Fenofibrate 160mg  - 1 tablet daily  Icosapent Ethyl (Vascepa) 1 gm - 2 capsules BID  Aspirin 81 mg - 1 tablet daily  Nitroglycerin 0.4 mg SL - PRN  We discussed: Confirms adherence to Praluent, ezetimibe, fenofibrate, Vascepa, aspirin. At start of visit, pt reported not taking any oral cholesterol medications. Asked patient go through pill bottles and indeed he did have all of the above medications which was confirmed by refill history. Patient's son fills his pillbox so there is some uncertainty from patient on what he taking.   Update 04/16/2020 - Patient continues to  take medications prescribed. Working to lose weight and is watching portion sizes. He walks most days at the mall. He denies any concerns with current medications.  Plan: Continue current medications  Heart Failure   Type: Diastolic Last ejection fraction: 01/2017, normal LVEF  Patient has failed these meds in past: none  Patient is currently controlled on the following medications:   Furosemide 40 mg - 1 tablet daily and as needed for extra weight   Potassium Chloride 10 mEq - 1 tablet BID >> not taking  We discussed: previously some uncertainty on whether patient is taking his Lasix; he did have the bottle at home tome and confirmed taking daily, denies SOB and swelling; he denies taking potassium and did not have the bottle at home,  nor it is it listed in refill history - most recent potassium WNL   Plan: Continue current medications  Hypertension   Office blood pressures are  BP Readings from Last 3 Encounters:  04/06/20 (!) 166/113  04/02/20 (!) 164/100  02/06/20 138/90   CMP Latest Ref Rng & Units 04/05/2020 01/19/2020 10/30/2019  Glucose 70 - 99 mg/dL 140(H) 195(H) 111(H)  BUN 6 - 23 mg/dL 25(H) 26(H) 28(H)  Creatinine 0.40 - 1.50 mg/dL 1.80(H) 1.51(H) 1.56(H)  Sodium 135 - 145 mEq/L 138 137 135  Potassium 3.5 - 5.1 mEq/L 4.2 4.3 4.5  Chloride 96 - 112 mEq/L 103 100 102  CO2 19 - 32 mEq/L 27 31 29   Calcium 8.4 - 10.5 mg/dL 10.2 10.5 10.4  Total Protein 6.5 - 8.1 g/dL - - -  Total Bilirubin 0.3 - 1.2 mg/dL - - -  Alkaline Phos 38 - 126 U/L - - -  AST 15 - 41 U/L - - -  ALT 0 - 44 U/L - - -   CPAP: wears nightly Patient has failed these meds in the past: none Patient checks BP at home: daughter or son have been checking the past couple of weeks Patient home BP readings are ranging: ~140/80 mmHg  (elevated at last office visit)  BP goal < 140/90 mmHg  Patient is currently controlled on the following medications:   Lisinopril 20 mg daily  Amlodipine 5 mg daily  Update 04/16/2020 - Home readings for blood pressure ~200/100 before amlodipine added. Came to office for bp check this morning but didn't have time to check it. Patient states that he is wanting to have abdominal aorta aneurysm repaired but has to be cleared from cardiology. Son is coming by to check blood pressure at home tomorrow. Plan to check bp upon follow-up phone call and assess AAA repair status.   Plan: Continue current medications.   CKD   CrCl: 52 ml/min (adjusted body weight: 100 kg; SCr: 1.56) GFR: (10/30/19) 43 m/miin No dose adjustments recommended.  Medication Management  Pharmacy: [facility], [facility]: [employer]  --> son organizes patient's medications - fills pillbox every 2 weeks  OTC: centrum multivitamin, colace daily,  vitamin D daily, Align probiotic, Claritin PRN, (no longer taking Preservision), dulcolax liquid PRN  Affordability: referred to [facility] for medicare plan comparisons; Praluent through manufacturer assistance    Vaccines: Recommend Shingrix  CCM Follow Up: 4 months (telephone)  Sherre Poot, PharmD, [facility] Clinical Pharmacist [facility] [phone] (office) [phone] (mobile)

## 2020-04-15 ENCOUNTER — Encounter (INDEPENDENT_AMBULATORY_CARE_PROVIDER_SITE_OTHER): Payer: Self-pay

## 2020-04-16 ENCOUNTER — Ambulatory Visit: Payer: Medicare Other

## 2020-04-16 ENCOUNTER — Other Ambulatory Visit: Payer: Self-pay

## 2020-04-16 DIAGNOSIS — I10 Essential (primary) hypertension: Secondary | ICD-10-CM

## 2020-04-16 DIAGNOSIS — E782 Mixed hyperlipidemia: Secondary | ICD-10-CM

## 2020-04-16 NOTE — Patient Instructions (Signed)
Visit Information  Goals Addressed            This Visit's Progress   . Pharmacy Care Plan       Current Barriers:  . Chronic Disease Management support, education, and care coordination needs related to neuropathic pain, hypertension, heart failure  Pharmacist Clinical Goal(s):  Marland Kitchen Hypertension: Maintain blood pressure < 140/90 mmHg o Continue to check blood pressure at home with son or daughter; Call if BP is consistently above goal. Assess lab work due to recent  lisinopril dose increase. Marland Kitchen Heart Failure: Prevent shortness of breath and fluid overload o Continue furosemide every day with an additional tablet as needed for swelling . Neuropathic pain: Maintain neuropathic pain control o Continue gabapentin 600 mg at bedtime - may take an additional tablet in the morning if needed. Continue Tylenol 500 mg twice daily and hydrocodone for severe pain.   . Weight loss goals: Goal weight < 200 lbs . Vaccinations: Remain up to date on vaccinations.  o Recommend new shingles vaccines (Shingrix).  o Discussed the potential COVID booster. Patient denies any interest in additional booster coverage at this time.   Interventions: . Comprehensive medication review performed. . Discussed patient's decision on pursuing abdominal aortic aneurysm repair and clearance by cardiology. Patient plans to call cardiology to schedule an appointment.   Patient Self Care Activities:  . Check with local pharmacy for shingles vaccine . Continue furosemide for swelling and take an additional tablet if you notice weight gain of > 3 pounds overnight  . Continue lisinopril 20 mg daily and amlodipine 5 mg daily for blood pressure.  . Consider mediterranean diet for weight loss   Please see past updates related to this goal by clicking on the "Past Updates" button in the selected goal        The patient verbalized understanding of instructions provided today and declined a print copy of patient instruction  materials.   Telephone follow up appointment with pharmacy team member scheduled for: 07/2020  Sherre Poot, PharmD, [facility] Clinical Pharmacist [facility] [phone] (office) [phone] (mobile)   DASH Eating Plan DASH stands for "Dietary Approaches to Stop Hypertension." The DASH eating plan is a healthy eating plan that has been shown to reduce high blood pressure (hypertension). It may also reduce your risk for type 2 diabetes, heart disease, and stroke. The DASH eating plan may also help with weight loss. What are tips for following this plan?  General guidelines  Avoid eating more than 2,300 mg (milligrams) of salt (sodium) a day. If you have hypertension, you may need to reduce your sodium intake to 1,500 mg a day.  Limit alcohol intake to no more than 1 drink a day for nonpregnant women and 2 drinks a day for men. One drink equals 12 oz of beer, 5 oz of wine, or 1 oz of hard liquor.  Work with your health care provider to maintain a healthy body weight or to lose weight. Ask what an ideal weight is for you.  Get at least 30 minutes of exercise that causes your heart to beat faster (aerobic exercise) most days of the week. Activities may include walking, swimming, or biking.  Work with your health care provider or diet and nutrition specialist (dietitian) to adjust your eating plan to your individual calorie needs. Reading food labels   Check food labels for the amount of sodium per serving. Choose foods with less than 5 percent of the Daily Value of sodium. Generally,  foods with less than 300 mg of sodium per serving fit into this eating plan.  To find whole grains, look for the word "whole" as the first word in the ingredient list. Shopping  Buy products labeled as "low-sodium" or "no salt added."  Buy fresh foods. Avoid canned foods and premade or frozen meals. Cooking  Avoid adding salt when cooking. Use salt-free seasonings or herbs instead of table  salt or sea salt. Check with your health care provider or pharmacist before using salt substitutes.  Do not fry foods. Cook foods using healthy methods such as baking, boiling, grilling, and broiling instead.  Cook with heart-healthy oils, such as olive, canola, soybean, or sunflower oil. Meal planning  Eat a balanced diet that includes: ? 5 or more servings of fruits and vegetables each day. At each meal, try to fill half of your plate with fruits and vegetables. ? Up to 6-8 servings of whole grains each day. ? Less than 6 oz of lean meat, poultry, or fish each day. A 3-oz serving of meat is about the same size as a deck of cards. One egg equals 1 oz. ? 2 servings of low-fat dairy each day. ? A serving of nuts, seeds, or beans 5 times each week. ? Heart-healthy fats. Healthy fats called Omega-3 fatty acids are found in foods such as flaxseeds and coldwater fish, like sardines, salmon, and mackerel.  Limit how much you eat of the following: ? Canned or prepackaged foods. ? Food that is high in trans fat, such as fried foods. ? Food that is high in saturated fat, such as fatty meat. ? Sweets, desserts, sugary drinks, and other foods with added sugar. ? Full-fat dairy products.  Do not salt foods before eating.  Try to eat at least 2 vegetarian meals each week.  Eat more home-cooked food and less restaurant, buffet, and fast food.  When eating at a restaurant, ask that your food be prepared with less salt or no salt, if possible. What foods are recommended? The items listed may not be a complete list. Talk with your dietitian about what dietary choices are best for you. Grains Whole-grain or whole-wheat bread. Whole-grain or whole-wheat pasta. Mozel Burdett rice. Modena Morrow. Bulgur. Whole-grain and low-sodium cereals. Pita bread. Low-fat, low-sodium crackers. Whole-wheat flour tortillas. Vegetables Fresh or frozen vegetables (raw, steamed, roasted, or grilled). Low-sodium or  reduced-sodium tomato and vegetable juice. Low-sodium or reduced-sodium tomato sauce and tomato paste. Low-sodium or reduced-sodium canned vegetables. Fruits All fresh, dried, or frozen fruit. Canned fruit in natural juice (without added sugar). Meat and other protein foods Skinless chicken or [place]. Ground chicken or [place]. Pork with fat trimmed off. Fish and seafood. Egg whites. Dried beans, peas, or lentils. Unsalted nuts, nut butters, and seeds. Unsalted canned beans. Lean cuts of beef with fat trimmed off. Low-sodium, lean deli meat. Dairy Low-fat (1%) or fat-free (skim) milk. Fat-free, low-fat, or reduced-fat cheeses. Nonfat, low-sodium ricotta or cottage cheese. Low-fat or nonfat yogurt. Low-fat, low-sodium cheese. Fats and oils Soft margarine without trans fats. Vegetable oil. Low-fat, reduced-fat, or light mayonnaise and salad dressings (reduced-sodium). Canola, safflower, olive, soybean, and sunflower oils. Avocado. Seasoning and other foods Herbs. Spices. Seasoning mixes without salt. Unsalted popcorn and pretzels. Fat-free sweets. What foods are not recommended? The items listed may not be a complete list. Talk with your dietitian about what dietary choices are best for you. Grains Baked goods made with fat, such as croissants, muffins, or some breads. Dry pasta or rice  meal packs. Vegetables Creamed or fried vegetables. Vegetables in a cheese sauce. Regular canned vegetables (not low-sodium or reduced-sodium). Regular canned tomato sauce and paste (not low-sodium or reduced-sodium). Regular tomato and vegetable juice (not low-sodium or reduced-sodium). Angie Fava. Olives. Fruits Canned fruit in a light or heavy syrup. Fried fruit. Fruit in cream or butter sauce. Meat and other protein foods Fatty cuts of meat. Ribs. Fried meat. Berniece Salines. Sausage. Bologna and other processed lunch meats. Salami. Fatback. Hotdogs. Bratwurst. Salted nuts and seeds. Canned beans with added salt. Canned or  smoked fish. Whole eggs or egg yolks. Chicken or [place] with skin. Dairy Whole or 2% milk, cream, and half-and-half. Whole or full-fat cream cheese. Whole-fat or sweetened yogurt. Full-fat cheese. Nondairy creamers. Whipped toppings. Processed cheese and cheese spreads. Fats and oils Butter. Stick margarine. Lard. Shortening. Ghee. Bacon fat. Tropical oils, such as coconut, palm kernel, or palm oil. Seasoning and other foods Salted popcorn and pretzels. Onion salt, garlic salt, seasoned salt, table salt, and sea salt. Worcestershire sauce. Tartar sauce. Barbecue sauce. Teriyaki sauce. Soy sauce, including reduced-sodium. Steak sauce. Canned and packaged gravies. Fish sauce. Oyster sauce. Cocktail sauce. Horseradish that you find on the shelf. Ketchup. Mustard. Meat flavorings and tenderizers. Bouillon cubes. Hot sauce and Tabasco sauce. Premade or packaged marinades. Premade or packaged taco seasonings. Relishes. Regular salad dressings. Where to find more information:  National Heart, Lung, and [place]: https://wilson-eaton.com/  American Heart Association: www.heart.org Summary  The DASH eating plan is a healthy eating plan that has been shown to reduce high blood pressure (hypertension). It may also reduce your risk for type 2 diabetes, heart disease, and stroke.  With the DASH eating plan, you should limit salt (sodium) intake to 2,300 mg a day. If you have hypertension, you may need to reduce your sodium intake to 1,500 mg a day.  When on the DASH eating plan, aim to eat more fresh fruits and vegetables, whole grains, lean proteins, low-fat dairy, and heart-healthy fats.  Work with your health care provider or diet and nutrition specialist (dietitian) to adjust your eating plan to your individual calorie needs. This information is not intended to replace advice given to you by your health care provider. Make sure you discuss any questions you have with your health care provider. Document  Revised: 07/27/2017 Document Reviewed: 08/07/2016 Elsevier Patient Education  2020 [employer].

## 2020-04-18 NOTE — Progress Notes (Signed)
I have collaborated with the care management provider regarding care management and care coordination activities outlined in this encounter and have reviewed this encounter including documentation in the note and care plan. I am certifying that I agree with the content of this note and encounter as supervising physician.  

## 2020-04-19 ENCOUNTER — Telehealth (INDEPENDENT_AMBULATORY_CARE_PROVIDER_SITE_OTHER): Payer: Self-pay

## 2020-04-19 NOTE — Telephone Encounter (Signed)
[name] MD: Hi Dr. Lucky Cowboy. My dad Anthony [name] 11-30-2035) would like to go ahead and have the endovascular repair of his AAA. He does have some minor CAD and is followed by Dr. Caryl Comes. Do we need to see Dr. Caryl Comes for cardiac clearance? thanks! I'm on his DPR so the scheduler's can call me when scheduling the procedure. Thanks! Anthony [name]  Dr. Lucky Cowboy: Sounds good. I thnk seeing Dr. Caryl Comes would be good to get clearance. I will add our scheduler to the conversation and go ahead and get the ball rolling on getting him booked as well. Let me know if you have any questions. My cell is [phone] and feel free to call me any time. [name] MD: Doristine Devoid! Thanks so much  Me: Patient has been scheduled for Endovascular AAA stent graft repair surgery on 05/05/20 with Dr. Lucky Cowboy at the [facility]. Phone call from pre-admit on 04/30/20 between 8-1 pm and covid testing on 05/04/20 between 8-1 pm. Pre-surgical instructions will be mailed. I spoke with Delilah Shan to scheduled anesthesia and OR staff downstairs in [place] for 05/05/20. Dr. [name] has been informed of this information.

## 2020-04-22 ENCOUNTER — Other Ambulatory Visit: Payer: Self-pay

## 2020-04-22 ENCOUNTER — Encounter: Payer: Self-pay | Admitting: Internal Medicine

## 2020-04-22 ENCOUNTER — Ambulatory Visit (INDEPENDENT_AMBULATORY_CARE_PROVIDER_SITE_OTHER): Payer: Medicare Other | Admitting: Internal Medicine

## 2020-04-22 VITALS — BP 130/82 | HR 81 | Ht 72.0 in | Wt 295.0 lb

## 2020-04-22 DIAGNOSIS — I10 Essential (primary) hypertension: Secondary | ICD-10-CM

## 2020-04-22 DIAGNOSIS — I2510 Atherosclerotic heart disease of native coronary artery without angina pectoris: Secondary | ICD-10-CM | POA: Diagnosis not present

## 2020-04-22 DIAGNOSIS — I483 Typical atrial flutter: Secondary | ICD-10-CM | POA: Diagnosis not present

## 2020-04-22 DIAGNOSIS — I5030 Unspecified diastolic (congestive) heart failure: Secondary | ICD-10-CM

## 2020-04-22 NOTE — Patient Instructions (Signed)
Medication Instructions:  - Your physician recommends that you continue on your current medications as directed. Please refer to the Current Medication list given to you today.  *If you need a refill on your cardiac medications before your next appointment, please call your pharmacy*   Lab Work: - none ordered  If you have labs (blood work) drawn today and your tests are completely normal, you will receive your results only by: . MyChart Message (if you have MyChart) OR . A paper copy in the mail If you have any lab test that is abnormal or we need to change your treatment, we will call you to review the results.   Testing/Procedures: - none ordered   Follow-Up: At [facility], you and your health needs are our priority.  As part of our continuing mission to provide you with exceptional heart care, we have created designated Provider Care Teams.  These Care Teams include your primary Cardiologist (physician) and Advanced Practice Providers (APPs -  Physician Assistants and Nurse Practitioners) who all work together to provide you with the care you need, when you need it.  We recommend signing up for the patient portal called "MyChart".  Sign up information is provided on this After Visit Summary.  MyChart is used to connect with patients for Virtual Visits (Telemedicine).  Patients are able to view lab/test results, encounter notes, upcoming appointments, etc.  Non-urgent messages can be sent to your provider as well.   To learn more about what you can do with MyChart, go to https://www.mychart.com.    Your next appointment:   1 year(s)  The format for your next appointment:   In Person  Provider:   Steven Klein, MD   Other Instructions n/a  

## 2020-04-22 NOTE — Progress Notes (Signed)
Patient Care Team: Ria Bush, MD as PCP - General (Family Medicine) Gillis Santa, MD as Consulting Physician (Pain Medicine) Deboraha Sprang, MD as Consulting Physician (Cardiology) Debbora Dus, [facility] as Pharmacist (Pharmacist)   HPI  Anthony [name] is a 84 y.o. male is the  father of [name] with a history of atrial flutter for which he underwent catheter ablation 9/18.   He has a history of coronary artery disease with prior stenting of his LAD and his RCA in 2013.    Echocardiogram 6/18 demonstrated normal LV function and normal left atrial size\ Blood hyperlipidemia and was referred to the lipid clinic and was started on a PCSK9 inhibitor  The patient denies chest pain, shortness of breath, nocturnal dyspnea, orthopnea or peripheral edema.  There have been no palpitations, lightheadedness or syncope.   Able to climb stairs.  He has been intercurrently diagnosed with an abdominal aortic aneurysm for which he is supposed to under go endovascular repair in 2 weeks.     Date Cr K Hgb  9/18   14.4  2/19 1.45 4.3    9/19  1.25 3.8 14.0  4/20 1.22 4.0   8/21 1.8 4.2 13.2   DATE TEST EF   6/18 Echo   55-65 %   4/19 MYOVIEW  57 % Non Ischemic           Past Medical History:  Diagnosis Date  . Anosmia 1980s  . Atrial flutter ([place]) 2018   had an ablation with dr. Erick Alley  . CAD (coronary artery disease)   . Cancer ([place])    skin cancer  . Chronic constipation   . Chronic insomnia   . Chronic lower back pain    s/p spine stimulator placement  . Complication of anesthesia    when under general, he woke up agitated and unable to be held down  . Diastolic CHF ([place])   . Dyspnea    had prior to having cardiac stent placed  . History of diverticulitis 2017  . History of pneumonia 2013  . Hyperlipidemia   . Hypertension   . Hypothyroidism   . Obesity, Class II, BMI 35-39.9, with comorbidity   . OSA on CPAP     Past Surgical History:    Procedure Laterality Date  . A-FLUTTER ABLATION N/A 05/07/2017   Procedure: A-Flutter Ablation;  Surgeon: Deboraha Sprang, MD;  Location: [place] [facility];  Service: Cardiovascular;  Laterality: N/A;  . CARDIOVASCULAR STRESS TEST  11/2017   no ischemia, low risk study  . COLONOSCOPY  2007   normal per prior PCP records, rpt 10 yrs (Dr Osie Cheeks)  . ELBOW SURGERY Right    ulnar nerve decompression.  PT DOES NOT RECALL THIS PROCEDURE  . LAMINECTOMY THORACIC SPINE W/ PLACEMENT SPINAL CORD STIMULATOR  08/2018   and removal of prior spine stimulator (Dr Lacinda Axon)  . NASAL SINUS SURGERY     nasal polyps. done a long time ago  . PERCUTANEOUS CORONARY STENT INTERVENTION (PCI-S)  2013   EF55%, 70% mid LAD, 99% mid RCA, mild MR, elev LVEDP, DES to mid LAD. RCA is nondominant  . RADIOFREQUENCY ABLATION  06/2017   lumbar region [facility])   . REPLACEMENT TOTAL KNEE BILATERAL Bilateral 2000s  . SPINAL CORD STIMULATOR IMPLANT  06/2016  . SPINAL CORD STIMULATOR INSERTION N/A 09/09/2018   Procedure: LUMBAR SPINAL CORD STIMULATOR LEAD AND BATTERY REMOVAL AND LAMINECTOMY FOR PLACEMENT OF PADDLE;  Surgeon: Lacinda Axon,  Remo Lipps, MD;  Location: [facility];  Service: Neurosurgery;  Laterality: N/A;  . SPINAL CORD STIMULATOR INSERTION N/A 09/16/2018   Procedure: PLACEMENT OF SPINAL CORD STIMULATOR BATTERY;  Surgeon: Deetta Perla, MD;  Location: [facility];  Service: Neurosurgery;  Laterality: N/A;    Current Meds  Medication Sig  . acetaminophen (TYLENOL) 500 MG tablet Take 1 tablet (500 mg total) by mouth 2 (two) times daily as needed for moderate pain.  . Alirocumab (PRALUENT) 75 MG/ML SOAJ Inject 1 pen into the skin every 14 (fourteen) days.  Marland Kitchen amLODipine (NORVASC) 5 MG tablet Take 1 tablet (5 mg total) by mouth daily.  Marland Kitchen aspirin EC 81 MG tablet Take 1 tablet (81 mg total) by mouth daily.  . Azelastine HCl 0.15 % SOLN as needed.   . Cholecalciferol (VITAMIN D3) 1000 units CAPS Take 1 capsule (1,000 Units total) by mouth  daily.  Marland Kitchen docusate sodium (COLACE) 100 MG capsule Take 100 mg by mouth as needed.   . ezetimibe (ZETIA) 10 MG tablet TAKE 1 TABLET(10 MG) BY MOUTH DAILY  . fenofibrate 160 MG tablet TAKE 1 TABLET BY MOUTH EVERY DAY  . finasteride (PROSCAR) 5 MG tablet TAKE 1 TABLET(5 MG) BY MOUTH DAILY  . fluocinonide (LIDEX) 0.05 % external solution Apply 1 application topically 2 (two) times daily as needed.  . furosemide (LASIX) 40 MG tablet TAKE 1 TABLET BY MOUTH EVERY DAY. TAKE AN EXTRA TABLET AS NEEDED FOR WEIGHT GAIN OF 3 LBS/DAY OR 5LBS/WEEK  . gabapentin (NEURONTIN) 300 MG capsule Take 2 capsules (600 mg total) by mouth at bedtime. With extra tablet during the day as needed  . HYDROcodone-acetaminophen (NORCO) 10-325 MG tablet Take 1 tablet by mouth every 8 (eight) hours as needed.  Vanessa Kick Ethyl (VASCEPA) 1 g CAPS Take 2 capsules (2 g total) by mouth 2 (two) times daily.  Marland Kitchen ketoconazole (NIZORAL) 2 % cream Apply 1 application topically daily as needed (for dermatitits).   Marland Kitchen ketoconazole (NIZORAL) 2 % shampoo Apply topically as directed. Apply 3 times weekly  . levothyroxine (SYNTHROID) 175 MCG tablet TAKE 1 TABLET(175 MCG) BY MOUTH DAILY BEFORE BREAKFAST  . LINZESS 290 MCG CAPS capsule TAKE 1 CAPSULE(290 MCG) BY MOUTH DAILY 30 MINUTES BEFORE FIRST MEAL OF THE DAY ON AN EMPTY STOMACH  . lisinopril (ZESTRIL) 20 MG tablet Take 1 tablet (20 mg total) by mouth daily.  Marland Kitchen loratadine (CLARITIN) 10 MG tablet Take 1 tablet (10 mg total) by mouth daily. (Patient taking differently: Take 10 mg by mouth as needed. )  . Multiple Vitamins-Minerals (CENTRUM SILVER PO) Take 1 tablet by mouth daily.  . Multiple Vitamins-Minerals (PRESERVISION AREDS 2 PO) Take 1 tablet by mouth daily.  . nitroGLYCERIN (NITROSTAT) 0.4 MG SL tablet Place 1 tablet (0.4 mg total) under the tongue every 5 (five) minutes as needed for chest pain.  . tamsulosin (FLOMAX) 0.4 MG CAPS capsule TAKE 2 CAPSULES(0.8 MG) BY MOUTH DAILY  . tiZANidine  (ZANAFLEX) 2 MG tablet Take 1-2 tablets (2-4 mg total) by mouth 2 (two) times daily as needed for muscle spasms.    Allergies  Allergen Reactions  . Questran [Cholestyramine]     Patient not aware of an allergy to this medicine.  . Atorvastatin     Muscle pain  . Gemfibrozil Other (See Comments)    Muscle pain.  . Metformin And Related     Dizziness  . Rosuvastatin     Muscle pain      Review of Systems  negative except from HPI and PMH  Physical Exam BP 130/82   Pulse 81   Ht 6' (1.829 m)   Wt 295 lb (133.8 kg)   SpO2 96%   BMI 40.01 kg/m  Well developed and nourished in no acute distress HENT normal Neck supple  Clear Regular rate and rhythm, no murmurs or gallops Abd-soft with active BS No Clubbing cyanosis edema Skin-warm and dry A & Oriented  Grossly normal sensory and motor function  ECG sinus at 81 Intervals 18/10/37 Left axis deviation   Assessment and Plan:  Atrial flutter-typical  S/p ablation  HFpEF  Statin intolerance  Renal insufficiency grade 3  Hyperlipidemia  Coronary artery disease with prior stenting  Morbid obesity  Abdominal aortic aneurysm  Preoperative assessment   Functional status is really quite good not withstanding his weight.  Risk should be acceptable for his procedure.  Without symptoms of ischemia  No arrhythmia of which he is aware   There is been a recent interval worsening in his renal function.  We will need to follow.  Hopefully this will improve following stenting.  LDL is now 24 (well) on the PCSK9.        Anthony [name]

## 2020-04-26 NOTE — Addendum Note (Signed)
Addended by: Raelene Bott, Royalty Fakhouri L on: 04/26/2020 11:17 AM   Modules accepted: Orders

## 2020-04-28 DIAGNOSIS — Z8679 Personal history of other diseases of the circulatory system: Secondary | ICD-10-CM | POA: Insufficient documentation

## 2020-04-28 DIAGNOSIS — Z9889 Other specified postprocedural states: Secondary | ICD-10-CM | POA: Insufficient documentation

## 2020-04-29 ENCOUNTER — Other Ambulatory Visit (INDEPENDENT_AMBULATORY_CARE_PROVIDER_SITE_OTHER): Payer: Self-pay | Admitting: Nurse Practitioner

## 2020-04-30 ENCOUNTER — Encounter
Admission: RE | Admit: 2020-04-30 | Discharge: 2020-04-30 | Disposition: A | Discharge status: Home / Self Care | Payer: Medicare Other | Source: Ambulatory Visit | Attending: Vascular Surgery | Admitting: Vascular Surgery

## 2020-04-30 ENCOUNTER — Other Ambulatory Visit: Payer: Self-pay

## 2020-04-30 DIAGNOSIS — Z01812 Encounter for preprocedural laboratory examination: Secondary | ICD-10-CM | POA: Insufficient documentation

## 2020-04-30 NOTE — Patient Instructions (Addendum)
Your procedure is scheduled on: 05-05-20 [facility] Report to Same Day Surgery 2nd floor medical mall [facility] Entrance-take elevator on left to 2nd floor.  Check in with surgery information desk.) To find out your arrival time please call [phone] between 1PM - 3PM on 05-04-20 TUESDAY  Remember: Instructions that are not followed completely may result in serious medical risk, up to and including death, or upon the discretion of your surgeon and anesthesiologist your surgery may need to be rescheduled.    _x___ 1. Do not eat food after midnight the night before your procedure. NO GUM OR CANDY AFTER MIDNIGHT. You may drink clear liquids up to 2 hours before you are scheduled to arrive at the hospital for your procedure.  Do not drink clear liquids within 2 hours of your scheduled arrival to the hospital.  Clear liquids include  --Water or Apple juice without pulp  --Gatorade  --Black Coffee or Clear Tea (No milk, no creamers, do not add anything to the coffee or Tea-OK TO ADD SUGAR)     __x__ 2. No Alcohol for 24 hours before or after surgery.   __x__3. No Smoking or e-cigarettes for 24 prior to surgery.  Do not use any chewable tobacco products for at least 6 hour prior to surgery   ____  4. Bring all medications with you on the day of surgery if instructed.    __x__ 5. Notify your doctor if there is any change in your medical condition     (cold, fever, infections).    x___6. On the morning of surgery brush your teeth with toothpaste and water.  You may rinse your mouth with mouth wash if you wish.  Do not swallow any toothpaste or mouthwash.   Do not wear jewelry, make-up, hairpins, clips or nail polish.  Do not wear lotions, powders, or perfumes.  Do not shave 48 hours prior to surgery. Men may shave face and neck.  Do not bring valuables to the hospital.    [facility] is not responsible for any belongings or valuables.               Contacts, dentures or bridgework may not  be worn into surgery.  Leave your suitcase in the car. After surgery it may be brought to your room.  For patients admitted to the hospital, discharge time is determined by your treatment team.  _  Patients discharged the day of surgery will not be allowed to drive home.  You will need someone to drive you home and stay with you the night of your procedure.    Please read over the following fact sheets that you were given:   [facility] Preparing for Surgery  _x___ TAKE THE FOLLOWING MEDICATION THE MORNING OF SURGERY WITH A SMALL SIP OF WATER. These include:  1. SYNTHROID (LEVOTHYROXINE)  2. NORVASC (AMLODIPINE)  3. ZETIA (EZETIMIBE)  4. FENOFIBRATE  5. PROSCAR (FINASTERIDE)  6.  ____Fleets enema or Magnesium Citrate as directed.   _x___ Use CHG Soap as directed on instruction sheet   ____ Use inhalers on the day of surgery and bring to hospital day of surgery  ____ Stop Metformin and Janumet 2 days prior to surgery.    ____ Take 1/2 of usual insulin dose the night before surgery and none on the morning surgery.   _x___ Follow recommendations from Cardiologist, Pulmonologist or PCP regarding stopping Aspirin, Coumadin, Plavix ,Eliquis, Effient, or Pradaxa, and Pletal-CONTINUE YOUR 81 MG ASPIRIN-DO NOT TAKE ASPIRIN THE  DAY OF YOUR SURGERY  X____Stop Anti-inflammatories such as Advil, Aleve, Ibuprofen, Motrin, Naproxen, Naprosyn, Goodies powders or aspirin products NOW-OK to take Tylenol OR HYDROCODONE IF NEEDED   _x___ Stop supplements until after surgery-STOP YOUR PRESERVISION AND VASCEPA NOW-YOU MAY RESUME AFTER YOUR SURGERY   _X___ Bring C-Pap to the hospital.    [place]

## 2020-04-30 NOTE — Pre-Procedure Instructions (Signed)
Deboraha Sprang, MD  Physician  Cardiology  Progress Notes    Signed  Encounter Date:  04/22/2020          Signed      Expand All Collapse All  Show:Clear all [x] Manual[x] Template[x] Copied  Added by: [x] Deboraha Sprang, MD  [] Hover for details       Patient Care Team: Ria Bush, MD as PCP - General (Family Medicine) Gillis Santa, MD as Consulting Physician (Pain Medicine) Deboraha Sprang, MD as Consulting Physician (Cardiology) Debbora Dus, [facility] as Pharmacist (Pharmacist)   HPI  Anthony [name] is a 84 y.o. male is the  father of [name] with a history of atrial flutter for which he underwent catheter ablation 9/18.   He has a history of coronary artery disease with prior stenting of his LAD and his RCA in 2013.    Echocardiogram 6/18 demonstrated normal LV function and normal left atrial size\ Blood hyperlipidemia and was referred to the lipid clinic and was started on a PCSK9 inhibitor  The patient denies chest pain, shortness of breath, nocturnal dyspnea, orthopnea or peripheral edema.  There have been no palpitations, lightheadedness or syncope.   Able to climb stairs.  He has been intercurrently diagnosed with an abdominal aortic aneurysm for which he is supposed to under go endovascular repair in 2 weeks.     Date Cr K Hgb  9/18   14.4  2/19 1.45 4.3    9/19  1.25 3.8 14.0  4/20 1.22 4.0   8/21 1.8 4.2 13.2   DATE TEST EF   6/18 Echo   55-65 %   4/19 MYOVIEW  57 % Non Ischemic               Past Medical History:  Diagnosis Date   Anosmia 1980s   Atrial flutter ([place]) 2018   had an ablation with dr. Erick Alley   CAD (coronary artery disease)    Cancer ([place])    skin cancer   Chronic constipation    Chronic insomnia    Chronic lower back pain    s/p spine stimulator placement   Complication of anesthesia    when under general, he woke up agitated and unable to be held down    Diastolic CHF ([place])    Dyspnea    had prior to having cardiac stent placed   History of diverticulitis 2017   History of pneumonia 2013   Hyperlipidemia    Hypertension    Hypothyroidism    Obesity, Class II, BMI 35-39.9, with comorbidity    OSA on CPAP          Past Surgical History:  Procedure Laterality Date   A-FLUTTER ABLATION N/A 05/07/2017   Procedure: A-Flutter Ablation;  Surgeon: Deboraha Sprang, MD;  Location: [place] [facility];  Service: Cardiovascular;  Laterality: N/A;   CARDIOVASCULAR STRESS TEST  11/2017   no ischemia, low risk study   COLONOSCOPY  2007   normal per prior PCP records, rpt 10 yrs (Dr Osie Cheeks)   [place] Right    ulnar nerve decompression.  PT DOES NOT RECALL THIS PROCEDURE   LAMINECTOMY THORACIC SPINE W/ PLACEMENT SPINAL CORD STIMULATOR  08/2018   and removal of prior spine stimulator (Dr Lacinda Axon)   NASAL SINUS SURGERY     nasal polyps. done a long time ago   [place] (PCI-S)  2013   EF55%, 70% mid LAD, 99% mid RCA, mild MR, elev LVEDP,  DES to mid LAD. RCA is nondominant   RADIOFREQUENCY ABLATION  06/2017   lumbar region [facility])    REPLACEMENT TOTAL KNEE BILATERAL Bilateral 2000s   SPINAL CORD STIMULATOR IMPLANT  06/2016   SPINAL CORD STIMULATOR INSERTION N/A 09/09/2018   Procedure: LUMBAR SPINAL CORD STIMULATOR LEAD AND BATTERY REMOVAL AND LAMINECTOMY FOR PLACEMENT OF PADDLE;  Surgeon: Deetta Perla, MD;  Location: [facility];  Service: Neurosurgery;  Laterality: N/A;   SPINAL CORD STIMULATOR INSERTION N/A 09/16/2018   Procedure: PLACEMENT OF SPINAL CORD STIMULATOR BATTERY;  Surgeon: Deetta Perla, MD;  Location: [facility];  Service: Neurosurgery;  Laterality: N/A;    Active Medications      Current Meds  Medication Sig   acetaminophen (TYLENOL) 500 MG tablet Take 1 tablet (500 mg total) by mouth 2 (two) times daily as needed for moderate pain.   Alirocumab  (PRALUENT) 75 MG/ML SOAJ Inject 1 pen into the skin every 14 (fourteen) days.   amLODipine (NORVASC) 5 MG tablet Take 1 tablet (5 mg total) by mouth daily.   aspirin EC 81 MG tablet Take 1 tablet (81 mg total) by mouth daily.   Azelastine HCl 0.15 % SOLN as needed.    Cholecalciferol (VITAMIN D3) 1000 units CAPS Take 1 capsule (1,000 Units total) by mouth daily.   docusate sodium (COLACE) 100 MG capsule Take 100 mg by mouth as needed.    ezetimibe (ZETIA) 10 MG tablet TAKE 1 TABLET(10 MG) BY MOUTH DAILY   fenofibrate 160 MG tablet TAKE 1 TABLET BY MOUTH EVERY DAY   finasteride (PROSCAR) 5 MG tablet TAKE 1 TABLET(5 MG) BY MOUTH DAILY   fluocinonide (LIDEX) 0.05 % external solution Apply 1 application topically 2 (two) times daily as needed.   furosemide (LASIX) 40 MG tablet TAKE 1 TABLET BY MOUTH EVERY DAY. TAKE AN EXTRA TABLET AS NEEDED FOR WEIGHT GAIN OF 3 LBS/DAY OR 5LBS/WEEK   gabapentin (NEURONTIN) 300 MG capsule Take 2 capsules (600 mg total) by mouth at bedtime. With extra tablet during the day as needed   HYDROcodone-acetaminophen (NORCO) 10-325 MG tablet Take 1 tablet by mouth every 8 (eight) hours as needed.   Icosapent Ethyl (VASCEPA) 1 g CAPS Take 2 capsules (2 g total) by mouth 2 (two) times daily.   ketoconazole (NIZORAL) 2 % cream Apply 1 application topically daily as needed (for dermatitits).    ketoconazole (NIZORAL) 2 % shampoo Apply topically as directed. Apply 3 times weekly   levothyroxine (SYNTHROID) 175 MCG tablet TAKE 1 TABLET(175 MCG) BY MOUTH DAILY BEFORE BREAKFAST   LINZESS 290 MCG CAPS capsule TAKE 1 CAPSULE(290 MCG) BY MOUTH DAILY 30 MINUTES BEFORE FIRST MEAL OF THE DAY ON AN EMPTY STOMACH   lisinopril (ZESTRIL) 20 MG tablet Take 1 tablet (20 mg total) by mouth daily.   loratadine (CLARITIN) 10 MG tablet Take 1 tablet (10 mg total) by mouth daily. (Patient taking differently: Take 10 mg by mouth as needed. )   Multiple Vitamins-Minerals (CENTRUM  SILVER PO) Take 1 tablet by mouth daily.   Multiple Vitamins-Minerals (PRESERVISION AREDS 2 PO) Take 1 tablet by mouth daily.   nitroGLYCERIN (NITROSTAT) 0.4 MG SL tablet Place 1 tablet (0.4 mg total) under the tongue every 5 (five) minutes as needed for chest pain.   tamsulosin (FLOMAX) 0.4 MG CAPS capsule TAKE 2 CAPSULES(0.8 MG) BY MOUTH DAILY   tiZANidine (ZANAFLEX) 2 MG tablet Take 1-2 tablets (2-4 mg total) by mouth 2 (two) times daily as needed for muscle spasms.  Allergies  Allergen Reactions   Questran [Cholestyramine]     Patient not aware of an allergy to this medicine.   Atorvastatin     Muscle pain   Gemfibrozil Other (See Comments)    Muscle pain.   Metformin And Related     Dizziness   Rosuvastatin     Muscle pain      Review of Systems negative except from HPI and PMH  Physical Exam BP 130/82    Pulse 81    Ht 6' (1.829 m)    Wt 295 lb (133.8 kg)    SpO2 96%    BMI 40.01 kg/m  Well developed and nourished in no acute distress HENT normal Neck supple  Clear Regular rate and rhythm, no murmurs or gallops Abd-soft with active BS No Clubbing cyanosis edema Skin-warm and dry A & Oriented  Grossly normal sensory and motor function  ECG sinus at 81 Intervals 18/10/37 Left axis deviation   Assessment and Plan:  Atrial flutter-typical  S/p ablation  HFpEF  Statin intolerance  Renal insufficiency grade 3  Hyperlipidemia  Coronary artery disease with prior stenting  Morbid obesity  Abdominal aortic aneurysm  Preoperative assessment   Functional status is really quite good not withstanding his weight.  Risk should be acceptable for his procedure.  Without symptoms of ischemia  No arrhythmia of which he is aware   There is been a recent interval worsening in his renal function.  We will need to follow.  Hopefully this will improve following stenting.  LDL is now 24 (well) on the  PCSK9.        Virl Axe         Electronically signed by Deboraha Sprang, MD at 04/22/2020 1:11 PM  Office Visit on 04/22/2020   Office Visit on 04/22/2020     Detailed Report    Note viewed by patient

## 2020-05-04 ENCOUNTER — Ambulatory Visit: Payer: Medicare Other | Admitting: Family Medicine

## 2020-05-04 ENCOUNTER — Encounter: Payer: Self-pay | Admitting: Vascular Surgery

## 2020-05-04 ENCOUNTER — Other Ambulatory Visit
Admission: RE | Admit: 2020-05-04 | Discharge: 2020-05-04 | Disposition: A | Discharge status: Home / Self Care | Payer: Medicare Other | Source: Ambulatory Visit | Attending: Vascular Surgery | Admitting: Vascular Surgery

## 2020-05-04 ENCOUNTER — Other Ambulatory Visit: Payer: Self-pay

## 2020-05-04 DIAGNOSIS — Z0289 Encounter for other administrative examinations: Secondary | ICD-10-CM

## 2020-05-04 DIAGNOSIS — Z01812 Encounter for preprocedural laboratory examination: Secondary | ICD-10-CM | POA: Insufficient documentation

## 2020-05-04 DIAGNOSIS — Z20822 Contact with and (suspected) exposure to covid-19: Secondary | ICD-10-CM | POA: Insufficient documentation

## 2020-05-04 LAB — CBC WITH DIFFERENTIAL/PLATELET
Abs Immature Granulocytes: 0.11 10*3/uL — ABNORMAL HIGH (ref 0.00–0.07)
Basophils Absolute: 0.2 10*3/uL — ABNORMAL HIGH (ref 0.0–0.1)
Basophils Relative: 2 %
Eosinophils Absolute: 1 10*3/uL — ABNORMAL HIGH (ref 0.0–0.5)
Eosinophils Relative: 11 %
HCT: 40.2 % (ref 39.0–52.0)
Hemoglobin: 13.8 g/dL (ref 13.0–17.0)
Immature Granulocytes: 1 %
Lymphocytes Relative: 35 %
Lymphs Abs: 3.5 10*3/uL (ref 0.7–4.0)
MCH: 31.7 pg (ref 26.0–34.0)
MCHC: 34.3 g/dL (ref 30.0–36.0)
MCV: 92.4 fL (ref 80.0–100.0)
Monocytes Absolute: 0.9 10*3/uL (ref 0.1–1.0)
Monocytes Relative: 9 %
Neutro Abs: 4.1 10*3/uL (ref 1.7–7.7)
Neutrophils Relative %: 42 %
Platelets: 295 10*3/uL (ref 150–400)
RBC: 4.35 MIL/uL (ref 4.22–5.81)
RDW: 14.1 % (ref 11.5–15.5)
WBC: 9.8 10*3/uL (ref 4.0–10.5)
nRBC: 0 % (ref 0.0–0.2)

## 2020-05-04 LAB — TYPE AND SCREEN
ABO/RH(D): O POS
Antibody Screen: NEGATIVE

## 2020-05-04 LAB — BASIC METABOLIC PANEL
Anion gap: 11 (ref 5–15)
BUN: 31 mg/dL — ABNORMAL HIGH (ref 8–23)
CO2: 27 mmol/L (ref 22–32)
Calcium: 10.5 mg/dL — ABNORMAL HIGH (ref 8.9–10.3)
Chloride: 101 mmol/L (ref 98–111)
Creatinine, Ser: 1.53 mg/dL — ABNORMAL HIGH (ref 0.61–1.24)
GFR calc Af Amer: 48 mL/min — ABNORMAL LOW (ref >60)
GFR calc non Af Amer: 42 mL/min — ABNORMAL LOW (ref >60)
Glucose, Bld: 160 mg/dL — ABNORMAL HIGH (ref 70–99)
Potassium: 3.9 mmol/L (ref 3.5–5.1)
Sodium: 139 mmol/L (ref 135–145)

## 2020-05-04 LAB — PROTIME-INR
INR: 1 (ref 0.8–1.2)
Prothrombin Time: 12.3 seconds (ref 11.4–15.2)

## 2020-05-04 LAB — APTT: aPTT: 30 seconds (ref 24–36)

## 2020-05-04 NOTE — Progress Notes (Addendum)
[facility]  Pre-Admission/Anesthesia Testing Clinical Review  Date: 05/04/20  Patient Demographics:  Name: Anthony [name] DOB:   [birth year] MRN:   [id number]  Planned Surgical Procedure(s):    Case: [id number] Date/Time: 05/05/20 0730   Procedure: ENDOVASCULAR REPAIR/STENT GRAFT (N/A )   Anesthesia type: General   Diagnosis: AAA (abdominal aortic aneurysm) without rupture (HCC) [I71.4]   Pre-op diagnosis:      Endovascular stent repair   GORE   AAA  Dr Lucky Cowboy w Dr Delana [name] to assist      [place] to confirm OR staff and anesthesia      [place]  Sept 7     cc:  Judi Cong   Location: [facility] / [place] [facility]   Providers: Algernon Huxley, MD     NOTE: Available PAT nursing documentation and vital signs have been reviewed. Clinical nursing staff has updated patient's PMH/PSHx, current medication list, and drug allergies/intolerances to ensure comprehensive history available to assist in medical decision making as it pertains to the aforementioned surgical procedure and anticipated anesthetic course.   Clinical Discussion:  Anthony [name] is a 84 y.o. male who is submitted for pre-surgical anesthesia review and clearance prior to him undergoing the above procedure. Patient has never been a smoker. Pertinent PMH includes: CAD (status post PCI in 2013), AAA, HFpEF, atrial flutter (s/p ablation), HTN, HLD, OSAH (requires nocturnal PAP therapy), hypothyroidism, CKD-III, GERD, chronic lower back pain (s/p spinal cord stimulator; asked to bring control device for surgery), OA, spinal stenosis, anxiety, depression.  Patient is followed by cardiology Caryl Comes, MD). He was last seen in the cardiology clinic on 04/22/2020; notes reviewed.  At the time of his office visit, patient was doing well from a cardiovascular standpoint.  Patient denied chest pain, shortness of breath, orthopnea, PND, peripheral edema, vertiginous symptoms,  palpitation, presyncope/syncope.  Patient with a functional capacity of >4 METS; able to ambulate and climb stairs without angina/anginal equivalent symptoms. Blood pressure well controlled on CCB and ACEi therapies. PMH significant for atrial flutter for which patient underwent catheter ablation in 05/17/2017. Status post PCI and stenting of his LAD and RCA in 2013. Last TTE on 02/14/2017 revealed normal LV function with an LVEF of 55-60%.  Subsequent Lexiscan on 12/15/2017 revealed an LVEF of 57% with no evidence of ischemia.  Patient recently found to have a calcified distal aortic aneurysm.  Patient has been seen in consult by vascular surgery and repair has been recommended.  Given patient's past medical history, presurgical cardiac clearance has been requested by performing surgeon and PAT team.  Per cardiology, "functional status is really quite good not withstanding his weight.  Risk should be acceptable for his procedure". This patient is on daily antiplatelet therapy. He has been instructed on recommendations for continuing his daily low-dose ASA throughout the perioperative period; will hold dose on day of surgery only.   He reports previous perioperative complications with anesthesia.  Patient reports anesthesia related delirium; "woke up agitated and had to be held down".  Review of records also indicate a (+) history of PONV documented in 2012 by [facility] in [place], [place]; lasted approximately 12 hours. PONV was felt to be related to the morphine provided in his neuraxial anesthetic course.  He underwent a general anesthetic course here (ASA III) whereby patient was labeled as a difficult intubation.  Of note, patient with a history of lumbar stenosis and chronic lower  back pain.  Will include results from most recent imaging below for review by the anesthesia team in the event that neuraxial anesthetic course is considered as part of patient's plan of care.  Vitals with BMI 04/22/2020  04/06/2020 04/02/2020  Height 6\' 0"  6\' 0"  -  Weight 295 lbs 295 lbs -  BMI 40 40 -  Systolic 025 427 062  Diastolic 82 376 283  Pulse 81 75 -    Providers/Specialists:   NOTE: Primary physician provider listed below. Patient may have been seen by APP or partner within same practice.   PROVIDER ROLE LAST Imelda Pillow, MD Vascular Surgery 04/06/2020  Ria Bush, MD Primary Care Provider 04/02/2020  Virl Axe, MD Cardiology 04/22/2020   Allergies:  Lucrezia Starch [cholestyramine], Atorvastatin, Gemfibrozil, Metformin and related, and Rosuvastatin  Current Home Medications:   No current facility-administered medications for this encounter.   Marland Kitchen acetaminophen (TYLENOL) 500 MG tablet  . Alirocumab (PRALUENT) 75 MG/ML SOAJ  . amLODipine (NORVASC) 5 MG tablet  . aspirin EC 81 MG tablet  . Azelastine HCl 0.15 % SOLN  . Cholecalciferol (VITAMIN D3) 1000 units CAPS  . diphenhydramine-acetaminophen (TYLENOL PM) 25-500 MG TABS tablet  . docusate sodium (COLACE) 100 MG capsule  . ezetimibe (ZETIA) 10 MG tablet  . fenofibrate 160 MG tablet  . finasteride (PROSCAR) 5 MG tablet  . fluocinonide (LIDEX) 0.05 % external solution  . furosemide (LASIX) 40 MG tablet  . gabapentin (NEURONTIN) 300 MG capsule  . HYDROcodone-acetaminophen (NORCO) 10-325 MG tablet  . Icosapent Ethyl (VASCEPA) 1 g CAPS  . ketoconazole (NIZORAL) 2 % cream  . ketoconazole (NIZORAL) 2 % shampoo  . levothyroxine (SYNTHROID) 175 MCG tablet  . LINZESS 290 MCG CAPS capsule  . lisinopril (ZESTRIL) 20 MG tablet  . loratadine (CLARITIN) 10 MG tablet  . Multiple Vitamins-Minerals (CENTRUM SILVER PO)  . Multiple Vitamins-Minerals (PRESERVISION AREDS 2 PO)  . nitroGLYCERIN (NITROSTAT) 0.4 MG SL tablet  . tamsulosin (FLOMAX) 0.4 MG CAPS capsule  . tiZANidine (ZANAFLEX) 2 MG tablet   History:   Past Medical History:  Diagnosis Date  . Anosmia 1980s  . Atrial flutter ([place]) 2018   had an ablation with dr. Erick Alley   . CAD (coronary artery disease)   . Cancer ([place])    skin cancer  . Chronic constipation   . Chronic insomnia   . Chronic lower back pain    s/p spine stimulator placement  . Complication of anesthesia    when under general, he woke up agitated and unable to be held down  . Diastolic CHF ([place])   . History of diverticulitis 2017  . History of pneumonia 2013  . Hyperlipidemia   . Hypertension   . Hypothyroidism   . Obesity, Class II, BMI 35-39.9, with comorbidity   . OSA on CPAP   . PONV (postoperative nausea and vomiting)    Past Surgical History:  Procedure Laterality Date  . A-FLUTTER ABLATION N/A 05/07/2017   Procedure: A-Flutter Ablation;  Surgeon: Deboraha Sprang, MD;  Location: [place] [facility];  Service: Cardiovascular;  Laterality: N/A;  . CARDIOVASCULAR STRESS TEST  11/2017   no ischemia, low risk study  . COLONOSCOPY  2007   normal per prior PCP records, rpt 10 yrs (Dr Osie Cheeks)  . ELBOW SURGERY Right    ulnar nerve decompression.  PT DOES NOT RECALL THIS PROCEDURE  . LAMINECTOMY THORACIC SPINE W/ PLACEMENT SPINAL CORD STIMULATOR  08/2018   and removal of prior  spine stimulator (Dr Lacinda Axon)  . NASAL SINUS SURGERY     nasal polyps. done a long time ago  . PERCUTANEOUS CORONARY STENT INTERVENTION (PCI-S)  2013   EF55%, 70% mid LAD, 99% mid RCA, mild MR, elev LVEDP, DES to mid LAD. RCA is nondominant  . RADIOFREQUENCY ABLATION  06/2017   lumbar region [facility])   . REPLACEMENT TOTAL KNEE BILATERAL Bilateral 2000s  . SPINAL CORD STIMULATOR IMPLANT  06/2016  . SPINAL CORD STIMULATOR INSERTION N/A 09/09/2018   Procedure: LUMBAR SPINAL CORD STIMULATOR LEAD AND BATTERY REMOVAL AND LAMINECTOMY FOR PLACEMENT OF PADDLE;  Surgeon: Deetta Perla, MD;  Location: [facility] ORS;  Service: Neurosurgery;  Laterality: N/A;  . SPINAL CORD STIMULATOR INSERTION N/A 09/16/2018   Procedure: PLACEMENT OF SPINAL CORD STIMULATOR BATTERY;  Surgeon: Deetta Perla, MD;  Location: [facility] ORS;  Service:  Neurosurgery;  Laterality: N/A;   Family History  Problem Relation Age of Onset  . Stroke Mother   . Kidney disease Father   . Leukemia Brother   . Stroke Sister   . Diabetes Neg Hx    Social History   Tobacco Use  . Smoking status: Never Smoker  . Smokeless tobacco: Never Used  Vaping Use  . Vaping Use: Never used  Substance Use Topics  . Alcohol use: No    Comment: QUIT DRINKING  . Drug use: No    Pertinent Clinical Results:  LABS: Labs reviewed: Acceptable for surgery.  Hospital Outpatient Visit on 05/04/2020  Component Date Value Ref Range Status  . aPTT 05/04/2020 30  24 - 36 seconds Final   Performed at [facility], [address] [place], [place] [address]  . Sodium 05/04/2020 139  135 - 145 mmol/L Final  . Potassium 05/04/2020 3.9  3.5 - 5.1 mmol/L Final  . Chloride 05/04/2020 101  98 - 111 mmol/L Final  . CO2 05/04/2020 27  22 - 32 mmol/L Final  . Glucose, Bld 05/04/2020 160* 70 - 99 mg/dL Final   Glucose reference range applies only to samples taken after fasting for at least 8 hours.  . BUN 05/04/2020 31* 8 - 23 mg/dL Final  . Creatinine, Ser 05/04/2020 1.53* 0.61 - 1.24 mg/dL Final  . Calcium 05/04/2020 10.5* 8.9 - 10.3 mg/dL Final  . GFR calc non Af Amer 05/04/2020 42* >60 mL/min Final  . GFR calc Af Amer 05/04/2020 48* >60 mL/min Final  . Anion gap 05/04/2020 11  5 - 15 Final   Performed at [facility], [address], [place], [place] [address]  . WBC 05/04/2020 9.8  4.0 - 10.5 K/uL Final  . RBC 05/04/2020 4.35  4.22 - 5.81 MIL/uL Final  . Hemoglobin 05/04/2020 13.8  13.0 - 17.0 g/dL Final  . HCT 05/04/2020 40.2  39 - 52 % Final  . MCV 05/04/2020 92.4  80.0 - 100.0 fL Final  . MCH 05/04/2020 31.7  26.0 - 34.0 pg Final  . MCHC 05/04/2020 34.3  30.0 - 36.0 g/dL Final  . RDW 05/04/2020 14.1  11.5 - 15.5 % Final  . Platelets 05/04/2020 295  150 - 400 K/uL Final  . nRBC 05/04/2020 0.0  0.0 - 0.2 % Final  . Neutrophils Relative %  05/04/2020 42  % Final  . Neutro Abs 05/04/2020 4.1  1.7 - 7.7 K/uL Final  . Lymphocytes Relative 05/04/2020 35  % Final  . Lymphs Abs 05/04/2020 3.5  0.7 - 4.0 K/uL Final  . Monocytes Relative 05/04/2020 9  %  Final  . Monocytes Absolute 05/04/2020 0.9  0 - 1 K/uL Final  . Eosinophils Relative 05/04/2020 11  % Final  . Eosinophils Absolute 05/04/2020 1.0* 0 - 0 K/uL Final  . Basophils Relative 05/04/2020 2  % Final  . Basophils Absolute 05/04/2020 0.2* 0 - 0 K/uL Final  . Immature Granulocytes 05/04/2020 1  % Final  . Abs Immature Granulocytes 05/04/2020 0.11* 0.00 - 0.07 K/uL Final   Performed at [facility], [address], [place], [place] [address]  . Prothrombin Time 05/04/2020 12.3  11.4 - 15.2 seconds Final  . INR 05/04/2020 1.0  0.8 - 1.2 Final   Comment: (NOTE) INR goal varies based on device and disease states. Performed at [facility], [address] [place], [place] [address]     ECG: Date: 04/22/2020 Rate: 81 bpm Rhythm: normal sinus Axis (leads I and aVF): Left axis deviation  ST segment and T wave changes: No evidence of acute ST segment elevation or depression Comparison: Similar to previous tracing obtained on 06/19/2019 NOTE: Tracing obtained at [facility], however due to technical difficulties, unable to review actual tracing. Above based on cardiologist's interpretation.    IMAGING / PROCEDURES: CT ANGIOGRAPHY ABDOMEN PELVIS W &/OR WO CONTRAST done on 04/09/2020 1. Infrarenal abdominal aortic aneurysm, estimated 4.8 cm on the current CT.   No focal inflammatory changes are identified.   Note there is accessory left renal artery that originates at the aneurysm sac, similar to the level of the IMA origin. 2. Mild aortic and iliac atherosclerosis, with no high-grade stenosis or occlusion. 3. In addition to bilateral Bosniak 1 cysts, there are multiple indeterminate smaller lesions as above.   MRI may be useful in  characterization, or alternatively ultrasound surveillance may be useful.  US AORTA DUPLEX LIMITED done on 04/02/2020 1. 5 cm distal abdominal aortic aneurysm.   Given aneurysm size, referral to vascular surgery is recommended.   Consider further anatomic delineation of aneurysm morphology and more exact measurements utilizing CTA of the abdomen and pelvis.  DG LUMBAR SPINE COMPLETE done on 04/02/2020 1. Diffuse degenerative disc disease and facet hypertrophy. 2. Trace anterolisthesis of L5 on S1. The L5 pars defects on prior CT are not appreciated by radiograph. 3. Rounded 2.3 cm calcification in the right upper quadrant may represent a gallstone. 4. Calcified aortic aneurysm measuring at least 6.2 cm, not well assessed by radiograph. This measures at 4.5 cm on 2019 CT. Recommend dedicated aortic imaging as well as referral to a vascular specialist.   LEXISCAN done on 12/25/2017 1. Normal myocardial perfusion without ischemia or scar. 2. The left ventricular ejection fraction is normal (57%) with normal wall motion. 3. Decreased exercise capacity. 4. This is a low risk study.  ECHOCARDIOGRAM done on 02/19/2017 1. LVEF 60% 2. Normal LV systolic function and wall motion 3. Normal LV wall the wall thickness 4. Unable to assess LV diastolic function 5. The RV is mildly dilated 6. RV systolic function is normal 7. Mild aortic root dilatation 8. Trivial pericardial effusion 9. No echocardiographic indications of cardiac tamponade 10. No aortic regurgitation or stenosis 11. The MV leaflets appear thickened, but open well.  No MV stenosis noted.  Trace MV regurgitation 12. No TV stenosis or regurgitation 13. The PV is not well visualized 14. IVC was not well visualized in the study   Impression and Plan:  Anthony [name] has been referred for pre-anesthesia review and clearance prior him undergoing the planned anesthetic and  procedural courses. Available labs, pertinent testing, and  imaging results were personally reviewed by me. This patient has been appropriately cleared by cardiology.   Based on clinical review performed today (05/04/20), barring any significant acute changes in the patient's overall condition, it is anticipated that he will be able to proceed with the planned surgical intervention. Any acute changes in clinical condition may necessitate his procedure being postponed and/or cancelled. Pre-surgical instructions were reviewed with the patient during his PAT appointment and questions were fielded by PAT clinical staff.  Honor Loh, MSN, APRN, FNP-C, CEN [facility]  Peri-operative Services Nurse Practitioner Phone: [phone] 05/04/20 11:07 AM  NOTE: This note has been prepared using Dragon dictation software. Despite my best ability to proofread, there is always the potential that unintentional transcriptional errors may still occur from this process.

## 2020-05-05 ENCOUNTER — Inpatient Hospital Stay
Admission: RE | Admit: 2020-05-05 | Discharge: 2020-05-06 | DRG: 269 | Disposition: A | Discharge status: Home / Self Care | Payer: Medicare Other | Attending: Vascular Surgery | Admitting: Vascular Surgery

## 2020-05-05 ENCOUNTER — Inpatient Hospital Stay: Payer: Medicare Other | Admitting: Urgent Care

## 2020-05-05 ENCOUNTER — Encounter
Admission: RE | Disposition: A | Discharge status: Home / Self Care | Payer: Self-pay | Source: Home / Self Care | Attending: Vascular Surgery

## 2020-05-05 ENCOUNTER — Encounter: Payer: Self-pay | Admitting: Vascular Surgery

## 2020-05-05 ENCOUNTER — Other Ambulatory Visit: Payer: Self-pay

## 2020-05-05 DIAGNOSIS — E039 Hypothyroidism, unspecified: Secondary | ICD-10-CM | POA: Diagnosis present

## 2020-05-05 DIAGNOSIS — I708 Atherosclerosis of other arteries: Secondary | ICD-10-CM | POA: Diagnosis present

## 2020-05-05 DIAGNOSIS — Z85828 Personal history of other malignant neoplasm of skin: Secondary | ICD-10-CM

## 2020-05-05 DIAGNOSIS — I700 Atherosclerosis of aorta: Secondary | ICD-10-CM | POA: Diagnosis present

## 2020-05-05 DIAGNOSIS — Z20822 Contact with and (suspected) exposure to covid-19: Secondary | ICD-10-CM | POA: Diagnosis present

## 2020-05-05 DIAGNOSIS — N1832 Chronic kidney disease, stage 3b: Secondary | ICD-10-CM | POA: Diagnosis present

## 2020-05-05 DIAGNOSIS — F5104 Psychophysiologic insomnia: Secondary | ICD-10-CM | POA: Diagnosis present

## 2020-05-05 DIAGNOSIS — M545 Low back pain: Secondary | ICD-10-CM | POA: Diagnosis present

## 2020-05-05 DIAGNOSIS — I5032 Chronic diastolic (congestive) heart failure: Secondary | ICD-10-CM | POA: Diagnosis not present

## 2020-05-05 DIAGNOSIS — G8929 Other chronic pain: Secondary | ICD-10-CM | POA: Diagnosis present

## 2020-05-05 DIAGNOSIS — Z79890 Hormone replacement therapy (postmenopausal): Secondary | ICD-10-CM

## 2020-05-05 DIAGNOSIS — G4733 Obstructive sleep apnea (adult) (pediatric): Secondary | ICD-10-CM | POA: Diagnosis present

## 2020-05-05 DIAGNOSIS — I714 Abdominal aortic aneurysm, without rupture, unspecified: Secondary | ICD-10-CM

## 2020-05-05 DIAGNOSIS — I2510 Atherosclerotic heart disease of native coronary artery without angina pectoris: Secondary | ICD-10-CM | POA: Diagnosis present

## 2020-05-05 DIAGNOSIS — I83813 Varicose veins of bilateral lower extremities with pain: Secondary | ICD-10-CM | POA: Diagnosis present

## 2020-05-05 DIAGNOSIS — Z6841 Body Mass Index (BMI) 40.0 and over, adult: Secondary | ICD-10-CM

## 2020-05-05 DIAGNOSIS — N183 Chronic kidney disease, stage 3 unspecified: Secondary | ICD-10-CM | POA: Diagnosis not present

## 2020-05-05 DIAGNOSIS — E785 Hyperlipidemia, unspecified: Secondary | ICD-10-CM | POA: Diagnosis present

## 2020-05-05 DIAGNOSIS — Z79899 Other long term (current) drug therapy: Secondary | ICD-10-CM | POA: Diagnosis not present

## 2020-05-05 DIAGNOSIS — Z8701 Personal history of pneumonia (recurrent): Secondary | ICD-10-CM | POA: Diagnosis not present

## 2020-05-05 DIAGNOSIS — Z7982 Long term (current) use of aspirin: Secondary | ICD-10-CM | POA: Diagnosis not present

## 2020-05-05 DIAGNOSIS — I129 Hypertensive chronic kidney disease with stage 1 through stage 4 chronic kidney disease, or unspecified chronic kidney disease: Secondary | ICD-10-CM | POA: Diagnosis present

## 2020-05-05 DIAGNOSIS — I130 Hypertensive heart and chronic kidney disease with heart failure and stage 1 through stage 4 chronic kidney disease, or unspecified chronic kidney disease: Secondary | ICD-10-CM | POA: Diagnosis present

## 2020-05-05 DIAGNOSIS — Z888 Allergy status to other drugs, medicaments and biological substances status: Secondary | ICD-10-CM

## 2020-05-05 DIAGNOSIS — I5030 Unspecified diastolic (congestive) heart failure: Secondary | ICD-10-CM | POA: Diagnosis present

## 2020-05-05 HISTORY — PX: ENDOVASCULAR STENT GRAFT (AAA): CATH118280

## 2020-05-05 HISTORY — DX: Nausea with vomiting, unspecified: R11.2

## 2020-05-05 HISTORY — DX: Nausea with vomiting, unspecified: Z98.890

## 2020-05-05 HISTORY — DX: Other specified postprocedural states: Z98.890

## 2020-05-05 LAB — SARS CORONAVIRUS 2 (TAT 6-24 HRS): SARS Coronavirus 2: NEGATIVE

## 2020-05-05 SURGERY — ENDOVASCULAR REPAIR/STENT GRAFT
Anesthesia: General

## 2020-05-05 MED ORDER — LISINOPRIL 10 MG PO TABS
20.0000 mg | ORAL_TABLET | Freq: Every day | ORAL | Status: DC
  Administered 2020-05-05: 20 mg via ORAL
  Filled 2020-05-05 (×2): qty 2

## 2020-05-05 MED ORDER — GABAPENTIN 300 MG PO CAPS: 600.0000 mg | ORAL_CAPSULE | Freq: Every day | ORAL | Status: DC

## 2020-05-05 MED ORDER — AMLODIPINE BESYLATE 5 MG PO TABS
5.0000 mg | ORAL_TABLET | ORAL | Status: DC
  Administered 2020-05-05 – 2020-05-06 (×2): 5 mg via ORAL
  Filled 2020-05-05 (×2): qty 1

## 2020-05-05 MED ORDER — ACETAMINOPHEN 500 MG PO TABS
ORAL_TABLET | ORAL | Status: AC
  Administered 2020-05-05: 500 mg via ORAL
  Filled 2020-05-05: qty 1

## 2020-05-05 MED ORDER — DIPHENHYDRAMINE HCL 25 MG PO CAPS
ORAL_CAPSULE | ORAL | Status: AC
  Filled 2020-05-05: qty 1

## 2020-05-05 MED ORDER — LINACLOTIDE 290 MCG PO CAPS
290.0000 ug | ORAL_CAPSULE | Freq: Every day | ORAL | Status: DC
  Administered 2020-05-06: 290 ug via ORAL
  Filled 2020-05-05: qty 1

## 2020-05-05 MED ORDER — CEFAZOLIN SODIUM-DEXTROSE 2-4 GM/100ML-% IV SOLN
INTRAVENOUS | Status: AC
  Filled 2020-05-05: qty 100

## 2020-05-05 MED ORDER — FENTANYL CITRATE (PF) 100 MCG/2ML IJ SOLN
INTRAMUSCULAR | Status: AC
  Filled 2020-05-05: qty 2

## 2020-05-05 MED ORDER — LIDOCAINE HCL (CARDIAC) PF 100 MG/5ML IV SOSY
PREFILLED_SYRINGE | INTRAVENOUS | Status: DC | PRN
  Administered 2020-05-05: 100 mg via INTRAVENOUS

## 2020-05-05 MED ORDER — SODIUM CHLORIDE 0.9 % IV SOLN: INTRAVENOUS | Status: DC

## 2020-05-05 MED ORDER — LEVOTHYROXINE SODIUM 175 MCG PO TABS
175.0000 ug | ORAL_TABLET | Freq: Every day | ORAL | Status: DC
  Administered 2020-05-06: 175 ug via ORAL
  Filled 2020-05-05: qty 1

## 2020-05-05 MED ORDER — TIZANIDINE HCL 2 MG PO TABS
2.0000 mg | ORAL_TABLET | Freq: Two times a day (BID) | ORAL | Status: DC | PRN
  Filled 2020-05-05: qty 2

## 2020-05-05 MED ORDER — GUAIFENESIN-DM 100-10 MG/5ML PO SYRP
15.0000 mL | ORAL_SOLUTION | ORAL | Status: DC | PRN
  Filled 2020-05-05: qty 15

## 2020-05-05 MED ORDER — FENTANYL CITRATE (PF) 100 MCG/2ML IJ SOLN
INTRAMUSCULAR | Status: DC | PRN
Start: 2020-05-05 — End: 2020-05-05
  Administered 2020-05-05 (×2): 50 ug via INTRAVENOUS

## 2020-05-05 MED ORDER — HYDROMORPHONE HCL 1 MG/ML IJ SOLN: 1.0000 mg | Freq: Once | INTRAMUSCULAR | Status: DC | PRN

## 2020-05-05 MED ORDER — PHENYLEPHRINE HCL (PRESSORS) 10 MG/ML IV SOLN
INTRAVENOUS | Status: DC | PRN
  Administered 2020-05-05: 100 ug via INTRAVENOUS
  Administered 2020-05-05: 200 ug via INTRAVENOUS
  Administered 2020-05-05 (×3): 100 ug via INTRAVENOUS

## 2020-05-05 MED ORDER — ACETAMINOPHEN 500 MG PO TABS: 500.0000 mg | ORAL_TABLET | Freq: Two times a day (BID) | ORAL | Status: DC | PRN

## 2020-05-05 MED ORDER — FENTANYL CITRATE (PF) 100 MCG/2ML IJ SOLN
25.0000 ug | INTRAMUSCULAR | Status: DC | PRN
  Administered 2020-05-05: 25 ug via INTRAVENOUS

## 2020-05-05 MED ORDER — ASPIRIN EC 81 MG PO TBEC
81.0000 mg | DELAYED_RELEASE_TABLET | Freq: Every day | ORAL | Status: DC
  Administered 2020-05-06: 81 mg via ORAL
  Filled 2020-05-05 (×2): qty 1

## 2020-05-05 MED ORDER — HYDRALAZINE HCL 20 MG/ML IJ SOLN: 5.0000 mg | INTRAMUSCULAR | Status: DC | PRN

## 2020-05-05 MED ORDER — LACTATED RINGERS IV SOLN: INTRAVENOUS | Status: DC | PRN

## 2020-05-05 MED ORDER — FINASTERIDE 5 MG PO TABS
5.0000 mg | ORAL_TABLET | ORAL | Status: DC
  Administered 2020-05-06: 5 mg via ORAL
  Filled 2020-05-05: qty 1

## 2020-05-05 MED ORDER — VITAMIN D3 25 MCG (1000 UNIT) PO TABS
1000.0000 [IU] | ORAL_TABLET | Freq: Every day | ORAL | Status: DC
  Administered 2020-05-06: 1000 [IU] via ORAL
  Filled 2020-05-05 (×2): qty 1

## 2020-05-05 MED ORDER — IODIXANOL 320 MG/ML IV SOLN
INTRAVENOUS | Status: DC | PRN
  Administered 2020-05-05: 45 mL via INTRA_ARTERIAL

## 2020-05-05 MED ORDER — MIDAZOLAM HCL 2 MG/2ML IJ SOLN
INTRAMUSCULAR | Status: AC
  Filled 2020-05-05: qty 2

## 2020-05-05 MED ORDER — EPHEDRINE SULFATE 50 MG/ML IJ SOLN
INTRAMUSCULAR | Status: DC | PRN
  Administered 2020-05-05 (×4): 5 mg via INTRAVENOUS

## 2020-05-05 MED ORDER — DIPHENHYDRAMINE HCL 25 MG PO CAPS: 25.0000 mg | ORAL_CAPSULE | Freq: Every day | ORAL | Status: DC

## 2020-05-05 MED ORDER — FENTANYL CITRATE (PF) 100 MCG/2ML IJ SOLN
INTRAMUSCULAR | Status: AC
  Administered 2020-05-05: 25 ug via INTRAVENOUS
  Filled 2020-05-05: qty 2

## 2020-05-05 MED ORDER — METOPROLOL TARTRATE 5 MG/5ML IV SOLN: 2.0000 mg | INTRAVENOUS | Status: DC | PRN

## 2020-05-05 MED ORDER — ALUM & MAG HYDROXIDE-SIMETH 200-200-20 MG/5ML PO SUSP: 15.0000 mL | ORAL | Status: DC | PRN

## 2020-05-05 MED ORDER — CEFAZOLIN SODIUM-DEXTROSE 2-4 GM/100ML-% IV SOLN
2.0000 g | INTRAVENOUS | Status: AC
  Administered 2020-05-05: 2 mg via INTRAVENOUS

## 2020-05-05 MED ORDER — SUGAMMADEX SODIUM 500 MG/5ML IV SOLN
INTRAVENOUS | Status: DC | PRN
  Administered 2020-05-05: 600 mg via INTRAVENOUS

## 2020-05-05 MED ORDER — ACETAMINOPHEN 325 MG RE SUPP
325.0000 mg | RECTAL | Status: DC | PRN
  Filled 2020-05-05: qty 2

## 2020-05-05 MED ORDER — LABETALOL HCL 5 MG/ML IV SOLN: 10.0000 mg | INTRAVENOUS | Status: DC | PRN

## 2020-05-05 MED ORDER — GLYCOPYRROLATE 0.2 MG/ML IJ SOLN
INTRAMUSCULAR | Status: DC | PRN
  Administered 2020-05-05: .2 mg via INTRAVENOUS

## 2020-05-05 MED ORDER — LACTATED RINGERS IV SOLN: INTRAVENOUS | Status: DC

## 2020-05-05 MED ORDER — PROPOFOL 10 MG/ML IV BOLUS
INTRAVENOUS | Status: DC | PRN
  Administered 2020-05-05: 130 mg via INTRAVENOUS
  Administered 2020-05-05: 20 mg via INTRAVENOUS

## 2020-05-05 MED ORDER — LORATADINE 10 MG PO TABS
10.0000 mg | ORAL_TABLET | Freq: Every day | ORAL | Status: DC
  Administered 2020-05-06: 10 mg via ORAL
  Filled 2020-05-05 (×2): qty 1

## 2020-05-05 MED ORDER — ORAL CARE MOUTH RINSE: 15.0000 mL | Freq: Once | OROMUCOSAL | Status: DC

## 2020-05-05 MED ORDER — CEFAZOLIN SODIUM-DEXTROSE 2-4 GM/100ML-% IV SOLN
INTRAVENOUS | Status: AC
  Administered 2020-05-05: 2 g via INTRAVENOUS
  Filled 2020-05-05: qty 100

## 2020-05-05 MED ORDER — DEXMEDETOMIDINE HCL IN NACL 200 MCG/50ML IV SOLN
INTRAVENOUS | Status: DC | PRN
  Administered 2020-05-05: 12 ug via INTRAVENOUS

## 2020-05-05 MED ORDER — NITROGLYCERIN 0.4 MG SL SUBL: 0.4000 mg | SUBLINGUAL_TABLET | SUBLINGUAL | Status: DC | PRN

## 2020-05-05 MED ORDER — SUCCINYLCHOLINE CHLORIDE 20 MG/ML IJ SOLN
INTRAMUSCULAR | Status: DC | PRN
  Administered 2020-05-05: 140 mg via INTRAVENOUS

## 2020-05-05 MED ORDER — FAMOTIDINE 20 MG PO TABS
ORAL_TABLET | ORAL | Status: AC
  Filled 2020-05-05: qty 1

## 2020-05-05 MED ORDER — PHENOL 1.4 % MT LIQD
1.0000 | OROMUCOSAL | Status: DC | PRN
  Filled 2020-05-05: qty 177

## 2020-05-05 MED ORDER — OMEGA-3-ACID ETHYL ESTERS 1 G PO CAPS
2.0000 g | ORAL_CAPSULE | Freq: Two times a day (BID) | ORAL | Status: DC
  Administered 2020-05-05 – 2020-05-06 (×2): 2 g via ORAL
  Filled 2020-05-05 (×3): qty 2

## 2020-05-05 MED ORDER — MORPHINE SULFATE (PF) 4 MG/ML IV SOLN: 2.0000 mg | INTRAVENOUS | Status: DC | PRN

## 2020-05-05 MED ORDER — DIPHENHYDRAMINE-APAP (SLEEP) 25-500 MG PO TABS: 1.0000 | ORAL_TABLET | Freq: Every day | ORAL | Status: DC

## 2020-05-05 MED ORDER — GABAPENTIN 300 MG PO CAPS
ORAL_CAPSULE | ORAL | Status: AC
  Administered 2020-05-05: 600 mg via ORAL
  Filled 2020-05-05: qty 2

## 2020-05-05 MED ORDER — HYDROCODONE-ACETAMINOPHEN 10-325 MG PO TABS: 1.0000 | ORAL_TABLET | Freq: Four times a day (QID) | ORAL | Status: DC | PRN

## 2020-05-05 MED ORDER — FAMOTIDINE IN NACL 20-0.9 MG/50ML-% IV SOLN
20.0000 mg | Freq: Two times a day (BID) | INTRAVENOUS | Status: DC
  Administered 2020-05-05 – 2020-05-06 (×2): 20 mg via INTRAVENOUS
  Filled 2020-05-05 (×3): qty 50

## 2020-05-05 MED ORDER — OXYCODONE HCL 5 MG PO TABS: 5.0000 mg | ORAL_TABLET | Freq: Once | ORAL | Status: DC | PRN

## 2020-05-05 MED ORDER — EZETIMIBE 10 MG PO TABS
10.0000 mg | ORAL_TABLET | ORAL | Status: DC
  Administered 2020-05-06: 10 mg via ORAL
  Filled 2020-05-05: qty 1

## 2020-05-05 MED ORDER — ONDANSETRON HCL 4 MG/2ML IJ SOLN
INTRAMUSCULAR | Status: DC | PRN
  Administered 2020-05-05: 4 mg via INTRAVENOUS

## 2020-05-05 MED ORDER — ASPIRIN EC 81 MG PO TBEC: 81.0000 mg | DELAYED_RELEASE_TABLET | Freq: Every day | ORAL | Status: DC

## 2020-05-05 MED ORDER — FUROSEMIDE 40 MG PO TABS
40.0000 mg | ORAL_TABLET | Freq: Every day | ORAL | Status: DC
  Administered 2020-05-06: 40 mg via ORAL
  Filled 2020-05-05 (×2): qty 1

## 2020-05-05 MED ORDER — CLOPIDOGREL BISULFATE 75 MG PO TABS
75.0000 mg | ORAL_TABLET | Freq: Every day | ORAL | Status: DC
  Administered 2020-05-06: 75 mg via ORAL
  Filled 2020-05-05: qty 1

## 2020-05-05 MED ORDER — ADULT MULTIVITAMIN W/MINERALS CH
ORAL_TABLET | Freq: Every day | ORAL | Status: DC
  Administered 2020-05-06: 1 via ORAL
  Filled 2020-05-05 (×2): qty 1

## 2020-05-05 MED ORDER — POTASSIUM CHLORIDE CRYS ER 20 MEQ PO TBCR: 20.0000 meq | EXTENDED_RELEASE_TABLET | Freq: Every day | ORAL | Status: DC | PRN

## 2020-05-05 MED ORDER — ACETAMINOPHEN 325 MG PO TABS: 325.0000 mg | ORAL_TABLET | ORAL | Status: DC | PRN

## 2020-05-05 MED ORDER — DOCUSATE SODIUM 100 MG PO CAPS: 100.0000 mg | ORAL_CAPSULE | Freq: Every day | ORAL | Status: DC

## 2020-05-05 MED ORDER — OCUVITE-LUTEIN PO CAPS
ORAL_CAPSULE | Freq: Every day | ORAL | Status: DC
  Filled 2020-05-05 (×2): qty 1

## 2020-05-05 MED ORDER — DIPHENHYDRAMINE HCL 25 MG PO CAPS
ORAL_CAPSULE | ORAL | Status: AC
  Administered 2020-05-05: 25 mg via ORAL
  Filled 2020-05-05: qty 1

## 2020-05-05 MED ORDER — CHLORHEXIDINE GLUCONATE 0.12 % MT SOLN
15.0000 mL | Freq: Once | OROMUCOSAL | Status: DC
  Filled 2020-05-05: qty 15

## 2020-05-05 MED ORDER — SODIUM CHLORIDE 0.9 % IV SOLN
INTRAVENOUS | Status: DC | PRN
  Administered 2020-05-05: 25 ug/min via INTRAVENOUS

## 2020-05-05 MED ORDER — TAMSULOSIN HCL 0.4 MG PO CAPS
0.8000 mg | ORAL_CAPSULE | Freq: Every day | ORAL | Status: DC
  Administered 2020-05-05: 0.8 mg via ORAL
  Filled 2020-05-05 (×2): qty 2

## 2020-05-05 MED ORDER — DEXAMETHASONE SODIUM PHOSPHATE 10 MG/ML IJ SOLN
INTRAMUSCULAR | Status: DC | PRN
  Administered 2020-05-05: 10 mg via INTRAVENOUS

## 2020-05-05 MED ORDER — ACETAMINOPHEN 500 MG PO TABS: 500.0000 mg | ORAL_TABLET | Freq: Every day | ORAL | Status: DC

## 2020-05-05 MED ORDER — SODIUM CHLORIDE 0.9 % IV SOLN: 500.0000 mL | Freq: Once | INTRAVENOUS | Status: DC | PRN

## 2020-05-05 MED ORDER — FENOFIBRATE 160 MG PO TABS
160.0000 mg | ORAL_TABLET | ORAL | Status: DC
  Administered 2020-05-06: 160 mg via ORAL
  Filled 2020-05-05: qty 1

## 2020-05-05 MED ORDER — ONDANSETRON HCL 4 MG/2ML IJ SOLN: 4.0000 mg | Freq: Four times a day (QID) | INTRAMUSCULAR | Status: DC | PRN

## 2020-05-05 MED ORDER — ICOSAPENT ETHYL 1 G PO CAPS
2.0000 g | ORAL_CAPSULE | Freq: Two times a day (BID) | ORAL | Status: DC
  Filled 2020-05-05: qty 2

## 2020-05-05 MED ORDER — CHLORHEXIDINE GLUCONATE CLOTH 2 % EX PADS
6.0000 | MEDICATED_PAD | Freq: Once | CUTANEOUS | Status: AC
  Administered 2020-05-05: 6 via TOPICAL

## 2020-05-05 MED ORDER — DOCUSATE SODIUM 100 MG PO CAPS
100.0000 mg | ORAL_CAPSULE | Freq: Every day | ORAL | Status: DC
  Administered 2020-05-05 – 2020-05-06 (×2): 100 mg via ORAL
  Filled 2020-05-05 (×2): qty 1

## 2020-05-05 MED ORDER — FAMOTIDINE 20 MG PO TABS
20.0000 mg | ORAL_TABLET | Freq: Once | ORAL | Status: AC
  Administered 2020-05-05: 20 mg via ORAL

## 2020-05-05 MED ORDER — HEPARIN SODIUM (PORCINE) 1000 UNIT/ML IJ SOLN
INTRAMUSCULAR | Status: DC | PRN
  Administered 2020-05-05: 6000 [IU] via INTRAVENOUS

## 2020-05-05 MED ORDER — OXYCODONE HCL 5 MG/5ML PO SOLN
5.0000 mg | Freq: Once | ORAL | Status: DC | PRN
  Filled 2020-05-05: qty 5

## 2020-05-05 MED ORDER — ONDANSETRON HCL 4 MG/2ML IJ SOLN: 4.0000 mg | Freq: Once | INTRAMUSCULAR | Status: DC | PRN

## 2020-05-05 MED ORDER — CEFAZOLIN SODIUM-DEXTROSE 2-4 GM/100ML-% IV SOLN: 2.0000 g | Freq: Three times a day (TID) | INTRAVENOUS | Status: AC

## 2020-05-05 MED ORDER — ROCURONIUM BROMIDE 100 MG/10ML IV SOLN
INTRAVENOUS | Status: DC | PRN
  Administered 2020-05-05: 70 mg via INTRAVENOUS

## 2020-05-05 MED ORDER — MAGNESIUM SULFATE 2 GM/50ML IV SOLN
2.0000 g | Freq: Every day | INTRAVENOUS | Status: DC | PRN
  Filled 2020-05-05: qty 50

## 2020-05-05 MED ORDER — NITROGLYCERIN IN D5W 200-5 MCG/ML-% IV SOLN: 5.0000 ug/min | INTRAVENOUS | Status: DC

## 2020-05-05 SURGICAL SUPPLY — 47 items
BLADE SURG 15 STRL LF DISP TIS (BLADE) ×1 IMPLANT
BLADE SURG 15 STRL SS (BLADE) ×1
BLADE SURG SZ11 CARB STEEL (BLADE) ×2 IMPLANT
BOOT SUTURE AID YELLOW STND (SUTURE) ×2 IMPLANT
CATH ACCU-VU SIZ PIG 5F 70CM (CATHETERS) ×2 IMPLANT
CATH ANGIO 5F PIGTAIL 65CM (CATHETERS) ×2 IMPLANT
CATH BALLN CODA 9X100X32 (BALLOONS) ×2 IMPLANT
CATH BEACON 5 .035 65 KMP TIP (CATHETERS) ×2 IMPLANT
DERMABOND ADVANCED (GAUZE/BANDAGES/DRESSINGS) ×1
DERMABOND ADVANCED .7 DNX12 (GAUZE/BANDAGES/DRESSINGS) ×1 IMPLANT
DEVICE CLOSURE PERCLS PRGLD 6F (VASCULAR PRODUCTS) ×6 IMPLANT
DEVICE SAFEGUARD 24CM (GAUZE/BANDAGES/DRESSINGS) ×4 IMPLANT
DEVICE TORQUE .025-.038 (MISCELLANEOUS) ×2 IMPLANT
DRYSEAL FLEXSHEATH 16FR 33CM (SHEATH) ×1
DRYSEAL FLEXSHEATH 18FR 33CM (SHEATH) ×1
ELECT CAUTERY BLADE 6.4 (BLADE) ×2 IMPLANT
ELECT REM PT RETURN 9FT ADLT (ELECTROSURGICAL) ×2
ELECTRODE REM PT RTRN 9FT ADLT (ELECTROSURGICAL) ×1 IMPLANT
EXCLUDER TNK LEG 28MX12X12 (Endovascular Graft) ×1 IMPLANT
EXCLUDER TRUNK LEG 28MX12X12 (Endovascular Graft) ×2 IMPLANT
GLIDEWIRE ANGLED SS 035X260CM (WIRE) ×2 IMPLANT
GLOVE BIO SURGEON STRL SZ7 (GLOVE) ×4 IMPLANT
GOWN STRL REUS W/ TWL LRG LVL3 (GOWN DISPOSABLE) ×1 IMPLANT
GOWN STRL REUS W/ TWL XL LVL3 (GOWN DISPOSABLE) ×2 IMPLANT
GOWN STRL REUS W/TWL LRG LVL3 (GOWN DISPOSABLE) ×1
GOWN STRL REUS W/TWL XL LVL3 (GOWN DISPOSABLE) ×2
LEG CONTRALATERAL 23X12 (Endovascular Graft) ×2 IMPLANT
LEG CONTRALATERAL 27X14 (Vascular Products) ×2 IMPLANT
LOOP RED MAXI  1X406MM (MISCELLANEOUS) ×1
LOOP VESSEL MAXI 1X406 RED (MISCELLANEOUS) ×1 IMPLANT
LOOP VESSEL MINI 0.8X406 BLUE (MISCELLANEOUS) ×1 IMPLANT
LOOPS BLUE MINI 0.8X406MM (MISCELLANEOUS) ×1
PACK ANGIOGRAPHY (CUSTOM PROCEDURE TRAY) ×2 IMPLANT
PACK BASIN MAJOR ARMC (MISCELLANEOUS) ×2 IMPLANT
PERCLOSE PROGLIDE 6F (VASCULAR PRODUCTS) ×12
SHEATH BRITE TIP 6FRX11 (SHEATH) ×4 IMPLANT
SHEATH BRITE TIP 8FRX11 (SHEATH) ×4 IMPLANT
SHEATH DRYSEAL FLEX 16FR 33CM (SHEATH) ×1 IMPLANT
SHEATH DRYSEAL FLEX 18FR 33CM (SHEATH) ×1 IMPLANT
SUT MNCRL 4-0 (SUTURE) ×1
SUT MNCRL 4-0 27XMFL (SUTURE) ×1
SUTURE MNCRL 4-0 27XMF (SUTURE) ×1 IMPLANT
SYR 20ML LL LF (SYRINGE) ×2 IMPLANT
SYR MEDRAD MARK 7 150ML (SYRINGE) ×2 IMPLANT
TUBING CONTRAST HIGH PRESS 72 (TUBING) ×2 IMPLANT
WIRE AMPLATZ SSTIFF .035X260CM (WIRE) ×4 IMPLANT
WIRE J 3MM .035X145CM (WIRE) ×4 IMPLANT

## 2020-05-05 NOTE — Op Note (Signed)
OPERATIVE NOTE   PROCEDURE: 1. US guidance for vascular access, bilateral femoral arteries 2. Catheter placement into aorta from bilateral femoral approaches 3. Placement of a 28 mm proximal, 12 cm length Gore Excluder Endoprosthesis main body right with a 27 mm x 12 cm length left contralateral limb 4. Placement of a 23 mm x 12 cm right iliac limb extender 5. ProGlide closure devices bilateral femoral arteries  PRE-OPERATIVE DIAGNOSIS: AAA  POST-OPERATIVE DIAGNOSIS: same  SURGEON: Leotis Pain, MD and Hortencia Pilar, MD - Co-surgeons  ANESTHESIA: general  ESTIMATED BLOOD LOSS: 25 cc  FINDING(S): 1.  AAA  SPECIMEN(S):  none  INDICATIONS:   Anthony [name] is a 84 y.o. male who presents with a roughly 5 cm AAA. The anatomy was suitable for endovascular repair.  Risks and benefits of repair in an endovascular fashion were discussed and informed consent was obtained. Co-surgeons are used to expedite the procedure and reduce operative time as bilateral work needs to be done.  DESCRIPTION: After obtaining full informed written consent, the patient was brought back to the operating room and placed supine upon the operating table.  The patient received IV antibiotics prior to induction.  After obtaining adequate anesthesia, the patient was prepped and draped in the standard fashion for endovascular AAA repair.  We then began by gaining access to both femoral arteries with US guidance with me working on the right and [name] working on the left.  The femoral arteries were found to be patent and accessed without difficulty with a needle under ultrasound guidance without difficulty on each side and permanent images were recorded.  We then placed 2 proglide devices on each side in a pre-close fashion and placed 8 French sheaths. The patient was then given 6000 units of intravenous heparin. The Pigtail catheter was placed into the aorta from the left side. Using this image, we selected a 28  mm proximal, 12 cm length Main body device.  Over a stiff wire, an 28 French sheath was placed up the right side. The main body was then placed through the 18 French sheath on the right. A Kumpe catheter was placed up the left side and a magnified image at the renal arteries was performed. The main body was then deployed just below the lowest renal artery. The Kumpe catheter was used to cannulate the contralateral gate without difficulty and successful cannulation was confirmed by twirling the pigtail catheter in the main body. We then placed a stiff wire and a retrograde arteriogram was performed through the left femoral sheath. We upsized to the 16 French sheath on the left for the contralateral limb and a 27 mm x 12 cm length limb was selected and deployed. The main body deployment was then completed. Based off the angiographic findings, extension limbs were necessary.  A 23 mm x 12 cm length right iliac extension limb was taken down to just above the right hypogastric artery . All junction points and seals zones were treated with the compliant balloon. The pigtail catheter was then replaced and a completion angiogram was performed.  A small type 2 Endoleak was detected on completion angiography. The renal arteries were found to be widely patent. Both hypogastric arteries were widely patent . At this point we elected to terminate the procedure. We secured the pro glide devices for hemostasis on the femoral arteries. The skin incision was closed with a 4-0 Monocryl. Dermabond and pressure dressing were placed. The patient was taken to the recovery room  in stable condition having tolerated the procedure well.  COMPLICATIONS: none  CONDITION: stable  Leotis Pain  05/05/2020, 9:15 AM   This note was created with Dragon Medical transcription system. Any errors in dictation are purely unintentional.

## 2020-05-05 NOTE — Anesthesia Preprocedure Evaluation (Addendum)
Anesthesia Evaluation  Patient identified by MRN, date of birth, ID band Patient awake    Reviewed: Allergy & Precautions, H&P , NPO status , Patient's Chart, lab work & pertinent test results  History of Anesthesia Complications (+) PONVNegative for: history of anesthetic complications  Airway Mallampati: III  TM Distance: >3 FB Neck ROM: full   Comment: Previous intubation: grade 2 view, 2 attempts required, unclear what was used for 1st attempt, ultimately intubated with [place] 3 Dental  (+) Implants   Pulmonary sleep apnea , neg COPD,    breath sounds clear to auscultation       Cardiovascular hypertension, (-) angina+ CAD, + Cardiac Stents and +CHF (HFpEF)  (-) Past MI + dysrhythmias (AFlutter s/p ablation 2018) Atrial Fibrillation  Rhythm:regular Rate:Normal  AAA without rupture   Neuro/Psych Depression Spinal cord stimulator negative psych ROS   GI/Hepatic Neg liver ROS, GERD  ,  Endo/Other  Hypothyroidism   Renal/GU Renal disease (CKD)     Musculoskeletal  (+) Arthritis ,   Abdominal   Peds  Hematology negative hematology ROS (+)   Anesthesia Other Findings Past Medical History: 1980s: Anosmia 2018: Atrial flutter (HCC)     Comment:  had an ablation with dr. Erick Alley No date: CAD (coronary artery disease) No date: Cancer ([place])     Comment:  skin cancer No date: Chronic constipation No date: Chronic insomnia No date: Chronic lower back pain     Comment:  s/p spine stimulator placement No date: Complication of anesthesia     Comment:  when under general, he woke up agitated and unable to be              held down No date: Diastolic CHF ([place]) 5956: History of diverticulitis 2013: History of pneumonia No date: Hyperlipidemia No date: Hypertension No date: Hypothyroidism No date: Obesity, Class II, BMI 35-39.9, with comorbidity No date: OSA on CPAP No date: PONV (postoperative nausea and  vomiting)  Past Surgical History: 05/07/2017: A-FLUTTER ABLATION; N/A     Comment:  Procedure: A-Flutter Ablation;  Surgeon: Deboraha Sprang, MD;  Location: [place] [facility];  Service:               Cardiovascular;  Laterality: N/A; 11/2017: CARDIOVASCULAR STRESS TEST     Comment:  no ischemia, low risk study 2007: COLONOSCOPY     Comment:  normal per prior PCP records, rpt 10 yrs (Dr Osie Cheeks) No date: ELBOW SURGERY; Right     Comment:  ulnar nerve decompression.  PT DOES NOT RECALL THIS               PROCEDURE 08/2018: LAMINECTOMY THORACIC SPINE W/ PLACEMENT SPINAL CORD  STIMULATOR     Comment:  and removal of prior spine stimulator (Dr Lacinda Axon) No date: NASAL SINUS SURGERY     Comment:  nasal polyps. done a long time ago 2013: [place] (PCI-S)     Comment:  EF55%, 70% mid LAD, 99% mid RCA, mild MR, elev LVEDP,               DES to mid LAD. RCA is nondominant 06/2017: RADIOFREQUENCY ABLATION     Comment:  lumbar region ([place])  2000s: REPLACEMENT TOTAL KNEE BILATERAL; Bilateral 06/2016: SPINAL CORD STIMULATOR IMPLANT 09/09/2018: SPINAL CORD STIMULATOR INSERTION; N/A     Comment:  Procedure: LUMBAR SPINAL CORD STIMULATOR LEAD AND  BATTERY REMOVAL AND LAMINECTOMY FOR PLACEMENT OF PADDLE;               Surgeon: Deetta Perla, MD;  Location: [facility] ORS;  Service:              Neurosurgery;  Laterality: N/A; 09/16/2018: SPINAL CORD STIMULATOR INSERTION; N/A     Comment:  Procedure: PLACEMENT OF SPINAL CORD STIMULATOR BATTERY;               Surgeon: Deetta Perla, MD;  Location: [facility] ORS;  Service:              Neurosurgery;  Laterality: N/A;  BMI    Body Mass Index: 40.01 kg/m      Reproductive/Obstetrics negative OB ROS                          Anesthesia Physical Anesthesia Plan  ASA: III  Anesthesia Plan: General ETT   Post-op Pain Management:    Induction:   PONV Risk Score and Plan:  Ondansetron, Dexamethasone and Treatment may vary due to age or medical condition  Airway Management Planned:   Additional Equipment: Arterial line  Intra-op Plan:   Post-operative Plan:   Informed Consent: I have reviewed the patients History and Physical, chart, labs and discussed the procedure including the risks, benefits and alternatives for the proposed anesthesia with the patient or authorized representative who has indicated his/her understanding and acceptance.     Dental Advisory Given  Plan Discussed with: Anesthesiologist, CRNA and Surgeon  Anesthesia Plan Comments:        Anesthesia Quick Evaluation

## 2020-05-05 NOTE — Op Note (Signed)
OPERATIVE NOTE   PROCEDURE: 1. US guidance for vascular access, bilateral femoral arteries 2. Catheter placement into aorta from bilateral femoral approaches 3. Placement of a new presents long 28 x 12 x 12 C3 Gore Excluder Endoprosthesis main body with a 27 x 14 contralateral limb and a 23 x 12 ipsilateral extender 4. ProGlide closure devices bilateral femoral arteries  PRE-OPERATIVE DIAGNOSIS: AAA  POST-OPERATIVE DIAGNOSIS: same  SURGEON: Hortencia Pilar, MD and Leotis Pain, MD - Co-surgeons  ANESTHESIA: General  ESTIMATED BLOOD LOSS: 50 cc  FINDING(S): 1.  AAA  SPECIMEN(S):  none  INDICATIONS:   Anthony [name] is a 84 y.o. y.o. male who presents with a large abdominal aortic aneurysm that has now grown to greater than 5 cm.  He is a suitable candidate for endovascular repair and therefore is undergoing stent graft placement for treatment of his AAA to prevent lethal rupture.  The risks and benefits of been reviewed all questions been answered patient agrees to proceed.  DESCRIPTION: After obtaining full informed written consent, the patient was brought back to the operating room and placed supine upon the operating table.  The patient received IV antibiotics prior to induction.  After obtaining adequate anesthesia, the patient was prepped and draped in the standard fashion for endovascular AAA repair.  Co-surgeons are required because this is a complex bilateral procedure with work being performed simultaneously from both the right femoral and left femoral approach.  This also expedites the procedure making a shorter operative time reducing complications and improving patient safety.  We then began by gaining access to both femoral arteries with US guidance with me working on the patient's left and Dr. Lucky Cowboy working on the right of the patient.  The femoral arteries were found to be patent and accessed without difficulty with a needle under ultrasound guidance without difficulty on  each side and permanent images were recorded.  We then placed 2 proglide devices on each side in a pre-close fashion and placed 8 French sheaths.  The patient was then given 6000 units of intravenous heparin.   The Pigtail catheter was placed into the aorta from the left side. Using this image, we selected a 28 x 12 x 12 C3 Main body device.  Over a stiff wire, an 90 French sheath was placed. The main body was then placed through the 18 French sheath. A Kumpe catheter was placed up the right side and a magnified image at the renal arteries was performed. The main body was then deployed just below the lowest renal artery. The Kumpe catheter was used to cannulate the contralateral gate without difficulty and successful cannulation was confirmed by twirling the pigtail catheter in the main body. We then placed a stiff wire and a retrograde arteriogram was performed through the left femoral sheath. We upsized to the 16 French sheath for the contralateral limb and a 27 x 14 limb was selected and deployed. The main body deployment was then completed. Based off the angiographic findings, extension limbs were necessary.  23 x 12 was advanced up the right side and deployed without difficulty. All junction points and seals zones were treated with the compliant balloon.   The pigtail catheter was then replaced and a completion angiogram was performed.   Type II endoleak was detected on completion angiography. The renal arteries were found to be widely patent.  There was a good seal distally in both common iliacs.   At this point we elected to terminate the procedure. We  secured the pro glide devices for hemostasis on the femoral arteries. The skin incision was closed with a 4-0 Monocryl. Dermabond and pressure dressing were placed. The patient was taken to the recovery room in stable condition having tolerated the procedure well.  COMPLICATIONS: none  CONDITION: stable  Hortencia Pilar  05/05/2020, 9:13  AM   -

## 2020-05-05 NOTE — Interval H&P Note (Signed)
History and Physical Interval Note:  05/05/2020 7:36 AM  Anthony [name]  has presented today for surgery, with the diagnosis of Endovascular stent repair   GORE   AAA  Dr Lucky Cowboy w Dr Delana [name] to [place] to confirm OR staff and anesthesia  Covid  Sept 7     cc:  Judi Cong.  The various methods of treatment have been discussed with the patient and family. After consideration of risks, benefits and other options for treatment, the patient has consented to  Procedure(s): ENDOVASCULAR REPAIR/STENT GRAFT (N/A) as a surgical intervention.  The patient's history has been reviewed, patient examined, no change in status, stable for surgery.  I have reviewed the patient's chart and labs.  Questions were answered to the patient's satisfaction.     Leotis Pain

## 2020-05-05 NOTE — Transfer of Care (Signed)
Immediate Anesthesia Transfer of Care Note  [name]  Procedure(s) Performed: ENDOVASCULAR REPAIR/STENT GRAFT (N/A )  Patient Location: PACU  Anesthesia Type:General  Level of Consciousness: awake, alert  and patient cooperative  Airway & Oxygen Therapy: Patient Spontanous Breathing and Patient connected to face mask oxygen  Post-op Assessment: Report given to RN and Post -op Vital signs reviewed and stable  Post vital signs: Reviewed and stable  Last Vitals:  Vitals Value Taken Time  BP 134/73 05/05/20 0933  Temp 36.2 C 05/05/20 0933  Pulse 91 05/05/20 0943  Resp 11 05/05/20 0943  SpO2 94 % 05/05/20 0943  Vitals shown include unvalidated device data.  Last Pain:  Vitals:   05/05/20 0933  TempSrc:   PainSc: 0-No pain         Complications: No complications documented.

## 2020-05-05 NOTE — Anesthesia Procedure Notes (Signed)
Procedure Name: Intubation Performed by: Kelton Pillar, CRNA Pre-anesthesia Checklist: Patient identified, Emergency Drugs available, Suction available and Patient being monitored Patient Re-evaluated:Patient Re-evaluated prior to induction Oxygen Delivery Method: Circle system utilized Preoxygenation: Pre-oxygenation with 100% oxygen Induction Type: IV induction Ventilation: Mask ventilation without difficulty Laryngoscope Size: McGraph and 4 Grade View: Grade II Tube type: Oral Number of attempts: 1 Airway Equipment and Method: Stylet and Oral airway Placement Confirmation: ETT inserted through vocal cords under direct vision,  positive ETCO2,  breath sounds checked- equal and bilateral and CO2 detector Secured at: 22 cm Tube secured with: Tape Dental Injury: Teeth and Oropharynx as per pre-operative assessment

## 2020-05-05 NOTE — Plan of Care (Signed)
Pt progressing well.  

## 2020-05-06 ENCOUNTER — Encounter: Payer: Self-pay | Admitting: Vascular Surgery

## 2020-05-06 DIAGNOSIS — I714 Abdominal aortic aneurysm, without rupture: Principal | ICD-10-CM

## 2020-05-06 LAB — CBC
HCT: 32.1 % — ABNORMAL LOW (ref 39.0–52.0)
Hemoglobin: 10.8 g/dL — ABNORMAL LOW (ref 13.0–17.0)
MCH: 32 pg (ref 26.0–34.0)
MCHC: 33.6 g/dL (ref 30.0–36.0)
MCV: 95.3 fL (ref 80.0–100.0)
Platelets: 204 10*3/uL (ref 150–400)
RBC: 3.37 MIL/uL — ABNORMAL LOW (ref 4.22–5.81)
RDW: 14.6 % (ref 11.5–15.5)
WBC: 12.3 10*3/uL — ABNORMAL HIGH (ref 4.0–10.5)
nRBC: 0 % (ref 0.0–0.2)

## 2020-05-06 LAB — BASIC METABOLIC PANEL
Anion gap: 8 (ref 5–15)
BUN: 31 mg/dL — ABNORMAL HIGH (ref 8–23)
CO2: 25 mmol/L (ref 22–32)
Calcium: 9.2 mg/dL (ref 8.9–10.3)
Chloride: 105 mmol/L (ref 98–111)
Creatinine, Ser: 1.61 mg/dL — ABNORMAL HIGH (ref 0.61–1.24)
GFR calc Af Amer: 45 mL/min — ABNORMAL LOW (ref >60)
GFR calc non Af Amer: 39 mL/min — ABNORMAL LOW (ref >60)
Glucose, Bld: 129 mg/dL — ABNORMAL HIGH (ref 70–99)
Potassium: 4.2 mmol/L (ref 3.5–5.1)
Sodium: 138 mmol/L (ref 135–145)

## 2020-05-06 MED ORDER — CLOPIDOGREL BISULFATE 75 MG PO TABS
75.0000 mg | ORAL_TABLET | Freq: Every day | ORAL | 3 refills | Status: DC
Start: 2020-05-07 — End: 2021-04-27

## 2020-05-06 MED ORDER — CEFAZOLIN SODIUM-DEXTROSE 2-4 GM/100ML-% IV SOLN
INTRAVENOUS | Status: AC
  Administered 2020-05-06: 2 g via INTRAVENOUS
  Filled 2020-05-06: qty 100

## 2020-05-06 NOTE — Discharge Summary (Signed)
[facility]    Discharge Summary  Patient ID:  Anthony [name] MRN: [id number] DOB/AGE: [birth year] 82 y.o.  Admit date: 05/05/2020 Discharge date: 05/06/2020 Date of Surgery: 05/05/2020 Surgeon: Surgeon(s): Dew, Erskine Squibb, MD Schnier, Dolores Lory, MD  Admission Diagnosis: AAA (abdominal aortic aneurysm) without rupture [facility]) [I71.4]  Discharge Diagnoses:  AAA (abdominal aortic aneurysm) without rupture [facility]) [I71.4]  Secondary Diagnoses: Past Medical History:  Diagnosis Date  . Anosmia 1980s  . Atrial flutter ([place]) 2018   had an ablation with dr. Erick Alley  . CAD (coronary artery disease)   . Cancer ([place])    skin cancer  . Chronic constipation   . Chronic insomnia   . Chronic lower back pain    s/p spine stimulator placement  . Complication of anesthesia    when under general, he woke up agitated and unable to be held down  . Diastolic CHF ([place])   . History of diverticulitis 2017  . History of pneumonia 2013  . Hyperlipidemia   . Hypertension   . Hypothyroidism   . Obesity, Class II, BMI 35-39.9, with comorbidity   . OSA on CPAP   . PONV (postoperative nausea and vomiting)    Procedure(s): 05/05/20: 1. US guidance for vascular access, bilateral femoral arteries 2. Catheter placement into aorta from bilateral femoral approaches 3. Placement of a 28 mm proximal, 12 cm length Gore Excluder Endoprosthesis main body right with a 27 mm x 12 cm length left contralateral limb 4. Placement of a 23 mm x 12 cm right iliac limb extender 5. ProGlide closure devices bilateral femoral arteries  Discharged Condition: Good  HPI / Hospital Course:  Anthony [name] is a 84 y.o. male who presents with a roughly 5 cm AAA. The anatomy was suitable for endovascular repair.  Risks and benefits of repair in an endovascular fashion were discussed and informed consent was obtained. On 05/05/2020 the patient underwent:  1. US guidance for vascular access, bilateral femoral  arteries 2. Catheter placement into aorta from bilateral femoral approaches 3. Placement of a 28 mm proximal, 12 cm length Gore Excluder Endoprosthesis main body right with a 27 mm x 12 cm length left contralateral limb 4. Placement of a 23 mm x 12 cm right iliac limb extender 5. ProGlide closure devices bilateral femoral arteries  The patient tolerated the procedure well was transferred from the operating to the recovery without issue.  Patient's night of surgery was unremarkable.  During the patient's brief inpatient stay, his diet was advanced, his Foley was removed, his pain was controlled with the use of p.o. pain medication he was ambulating at baseline. Day of discharge, the patient was afebrile with stable vital signs and an unremarkable physical exam.  Physical exam:  Alert and oriented x3, no acute distress Cardiovascular: Regular rate and rhythm Pulmonary: Clear to auscultation bilaterally Abdomen: Soft, nontender, nondistended positive bowel sounds Groins:  Access sites are clean dry intact.  No swelling or drainage. Extremity: Warm distally to toes  Labs: As below  Complications: None  Consults: None  Significant Diagnostic Studies: CBC Lab Results  Component Value Date   WBC 12.3 (H) 05/06/2020   HGB 10.8 (L) 05/06/2020   HCT 32.1 (L) 05/06/2020   MCV 95.3 05/06/2020   PLT 204 05/06/2020   BMET    Component Value Date/Time   NA 138 05/06/2020 0635   NA 139 05/03/2017 1037   K 4.2 05/06/2020 0635   CL 105 05/06/2020 1937  CO2 25 05/06/2020 0635   GLUCOSE 129 (H) 05/06/2020 0635   BUN 31 (H) 05/06/2020 0635   BUN 23 05/03/2017 1037   CREATININE 1.61 (H) 05/06/2020 0635   CALCIUM 9.2 05/06/2020 0635   GFRNONAA 39 (L) 05/06/2020 0635   GFRAA 45 (L) 05/06/2020 0635   COAG Lab Results  Component Value Date   INR 1.0 05/04/2020   INR 0.91 09/09/2018   INR 0.92 08/19/2018   Disposition:  Discharge to :Home  Allergies as of 05/06/2020      Reactions    Questran [cholestyramine]    Patient not aware of an allergy to this medicine.   Atorvastatin    Muscle pain   Gemfibrozil Other (See Comments)   Muscle pain.   Metformin And Related    Dizziness   Rosuvastatin    Muscle pain      Medication List    TAKE these medications   acetaminophen 500 MG tablet Commonly known as: TYLENOL Take 1 tablet (500 mg total) by mouth 2 (two) times daily as needed for moderate pain.   amLODipine 5 MG tablet Commonly known as: NORVASC Take 1 tablet (5 mg total) by mouth daily. What changed: when to take this   aspirin EC 81 MG tablet Take 1 tablet (81 mg total) by mouth daily.   Azelastine HCl 0.15 % Soln as needed.   clopidogrel 75 MG tablet Commonly known as: PLAVIX Take 1 tablet (75 mg total) by mouth daily at 6 (six) AM. Start taking on: May 07, 2020   diphenhydramine-acetaminophen 25-500 MG Tabs tablet Commonly known as: TYLENOL PM Take 1 tablet by mouth at bedtime.   docusate sodium 100 MG capsule Commonly known as: COLACE Take 100 mg by mouth daily.   ezetimibe 10 MG tablet Commonly known as: ZETIA TAKE 1 TABLET(10 MG) BY MOUTH DAILY What changed: See the new instructions.   fenofibrate 160 MG tablet TAKE 1 TABLET BY MOUTH EVERY DAY What changed: when to take this   finasteride 5 MG tablet Commonly known as: PROSCAR TAKE 1 TABLET(5 MG) BY MOUTH DAILY What changed: See the new instructions.   fluocinonide 0.05 % external solution Commonly known as: LIDEX Apply 1 application topically 2 (two) times daily as needed. What changed: when to take this   furosemide 40 MG tablet Commonly known as: LASIX TAKE 1 TABLET BY MOUTH EVERY DAY. TAKE AN EXTRA TABLET AS NEEDED FOR WEIGHT GAIN OF 3 LBS/DAY OR 5LBS/WEEK   gabapentin 300 MG capsule Commonly known as: NEURONTIN Take 2 capsules (600 mg total) by mouth at bedtime. With extra tablet during the day as needed   HYDROcodone-acetaminophen 10-325 MG tablet Commonly  known as: NORCO Take 1 tablet by mouth every 8 (eight) hours as needed.   ketoconazole 2 % cream Commonly known as: NIZORAL Apply 1 application topically daily as needed (for dermatitits).   ketoconazole 2 % shampoo Commonly known as: NIZORAL Apply topically as directed. Apply 3 times weekly   levothyroxine 175 MCG tablet Commonly known as: SYNTHROID TAKE 1 TABLET(175 MCG) BY MOUTH DAILY BEFORE BREAKFAST   Linzess 290 MCG Caps capsule Generic drug: linaclotide TAKE 1 CAPSULE(290 MCG) BY MOUTH DAILY 30 MINUTES BEFORE FIRST MEAL OF THE DAY ON AN EMPTY STOMACH   lisinopril 20 MG tablet Commonly known as: ZESTRIL Take 1 tablet (20 mg total) by mouth daily.   loratadine 10 MG tablet Commonly known as: CLARITIN Take 1 tablet (10 mg total) by mouth daily.   nitroGLYCERIN 0.4  MG SL tablet Commonly known as: NITROSTAT Place 1 tablet (0.4 mg total) under the tongue every 5 (five) minutes as needed for chest pain.   Praluent 75 MG/ML Soaj Generic drug: Alirocumab Inject 1 pen into the skin every 14 (fourteen) days.   PRESERVISION AREDS 2 PO Take 1 tablet by mouth daily.   CENTRUM SILVER PO Take 1 tablet by mouth daily.   tamsulosin 0.4 MG Caps capsule Commonly known as: FLOMAX TAKE 2 CAPSULES(0.8 MG) BY MOUTH DAILY What changed: See the new instructions.   tiZANidine 2 MG tablet Commonly known as: ZANAFLEX Take 1-2 tablets (2-4 mg total) by mouth 2 (two) times daily as needed for muscle spasms.   Vascepa 1 g capsule Generic drug: icosapent Ethyl Take 2 capsules (2 g total) by mouth 2 (two) times daily.   Vitamin D3 25 MCG (1000 UT) Caps Take 1 capsule (1,000 Units total) by mouth daily.      Verbal and written Discharge instructions given to the patient. Wound care per Discharge AVS  Follow-up Information    Kris Hartmann, NP Follow up in 1 month(s).   Specialty: Vascular Surgery Why: First post-op visit. Needs EVAR with visit.  Contact information: [place] [address] [phone]              Signed: Sela Hua, PA-C  05/06/2020, 12:50 PM

## 2020-05-06 NOTE — Progress Notes (Signed)
Nsg Discharge Note  Admit Date:  05/05/2020 Discharge date: 05/06/2020   [name] to be D/C'd Home per MD order.  AVS completed.  Copy for chart, and copy for patient signed, and dated. Patient/caregiver able to verbalize understanding.  Discharge Medication: Allergies as of 05/06/2020      Reactions   Questran [cholestyramine]    Patient not aware of an allergy to this medicine.   Atorvastatin    Muscle pain   Gemfibrozil Other (See Comments)   Muscle pain.   Metformin And Related    Dizziness   Rosuvastatin    Muscle pain      Medication List    TAKE these medications   acetaminophen 500 MG tablet Commonly known as: TYLENOL Take 1 tablet (500 mg total) by mouth 2 (two) times daily as needed for moderate pain.   amLODipine 5 MG tablet Commonly known as: NORVASC Take 1 tablet (5 mg total) by mouth daily. What changed: when to take this   aspirin EC 81 MG tablet Take 1 tablet (81 mg total) by mouth daily.   Azelastine HCl 0.15 % Soln as needed.   clopidogrel 75 MG tablet Commonly known as: PLAVIX Take 1 tablet (75 mg total) by mouth daily at 6 (six) AM. Start taking on: May 07, 2020   diphenhydramine-acetaminophen 25-500 MG Tabs tablet Commonly known as: TYLENOL PM Take 1 tablet by mouth at bedtime.   docusate sodium 100 MG capsule Commonly known as: COLACE Take 100 mg by mouth daily.   ezetimibe 10 MG tablet Commonly known as: ZETIA TAKE 1 TABLET(10 MG) BY MOUTH DAILY What changed: See the new instructions.   fenofibrate 160 MG tablet TAKE 1 TABLET BY MOUTH EVERY DAY What changed: when to take this   finasteride 5 MG tablet Commonly known as: PROSCAR TAKE 1 TABLET(5 MG) BY MOUTH DAILY What changed: See the new instructions.   fluocinonide 0.05 % external solution Commonly known as: LIDEX Apply 1 application topically 2 (two) times daily as needed. What changed: when to take this   furosemide 40 MG tablet Commonly known as: LASIX TAKE 1  TABLET BY MOUTH EVERY DAY. TAKE AN EXTRA TABLET AS NEEDED FOR WEIGHT GAIN OF 3 LBS/DAY OR 5LBS/WEEK   gabapentin 300 MG capsule Commonly known as: NEURONTIN Take 2 capsules (600 mg total) by mouth at bedtime. With extra tablet during the day as needed   HYDROcodone-acetaminophen 10-325 MG tablet Commonly known as: NORCO Take 1 tablet by mouth every 8 (eight) hours as needed.   ketoconazole 2 % cream Commonly known as: NIZORAL Apply 1 application topically daily as needed (for dermatitits).   ketoconazole 2 % shampoo Commonly known as: NIZORAL Apply topically as directed. Apply 3 times weekly   levothyroxine 175 MCG tablet Commonly known as: SYNTHROID TAKE 1 TABLET(175 MCG) BY MOUTH DAILY BEFORE BREAKFAST   Linzess 290 MCG Caps capsule Generic drug: linaclotide TAKE 1 CAPSULE(290 MCG) BY MOUTH DAILY 30 MINUTES BEFORE FIRST MEAL OF THE DAY ON AN EMPTY STOMACH   lisinopril 20 MG tablet Commonly known as: ZESTRIL Take 1 tablet (20 mg total) by mouth daily.   loratadine 10 MG tablet Commonly known as: CLARITIN Take 1 tablet (10 mg total) by mouth daily.   nitroGLYCERIN 0.4 MG SL tablet Commonly known as: NITROSTAT Place 1 tablet (0.4 mg total) under the tongue every 5 (five) minutes as needed for chest pain.   Praluent 75 MG/ML Soaj Generic drug: Alirocumab Inject 1 pen into the  skin every 14 (fourteen) days.   PRESERVISION AREDS 2 PO Take 1 tablet by mouth daily.   CENTRUM SILVER PO Take 1 tablet by mouth daily.   tamsulosin 0.4 MG Caps capsule Commonly known as: FLOMAX TAKE 2 CAPSULES(0.8 MG) BY MOUTH DAILY What changed: See the new instructions.   tiZANidine 2 MG tablet Commonly known as: ZANAFLEX Take 1-2 tablets (2-4 mg total) by mouth 2 (two) times daily as needed for muscle spasms.   Vascepa 1 g capsule Generic drug: icosapent Ethyl Take 2 capsules (2 g total) by mouth 2 (two) times daily.   Vitamin D3 25 MCG (1000 UT) Caps Take 1 capsule (1,000 Units  total) by mouth daily.       Discharge Assessment: Vitals:   05/06/20 1020 05/06/20 1435  BP: (!) 115/56 (!) 146/84  Pulse: 71 89  Resp:  18  Temp:  99.1 F (37.3 C)  SpO2: 97% 99%   Skin clean, dry and intact without evidence of skin break down, no evidence of skin tears noted. IV catheter discontinued intact. Site without signs and symptoms of complications - no redness or edema noted at insertion site, patient denies c/o pain - only slight tenderness at site.  Dressing with slight pressure applied.  D/c Instructions-Education: Discharge instructions given to patient/family with verbalized understanding. D/c education completed with patient/family including follow up instructions, medication list, d/c activities limitations if indicated, with other d/c instructions as indicated by MD - patient able to verbalize understanding, all questions fully answered. Patient instructed to return to ED, call 911, or call MD for any changes in condition.  Patient escorted via [place], and D/C home via private auto.  Tresa Endo, RN 05/06/2020 3:06 PM

## 2020-05-06 NOTE — Progress Notes (Signed)
Bilateral groin PADs removed. No bleeding noted. Site dressed with gauze and surgical tape.

## 2020-05-06 NOTE — Discharge Instructions (Signed)
Vascular Surgery Discharge Instructions: You may shower please keep your groins clean and dry.  Gently clean your groins with soap and water.  Gently pat dry. No heavy lifting greater than 10 pounds or engaging in strenuous activity until you are cleared during your first postoperative visit.

## 2020-05-06 NOTE — Progress Notes (Signed)
10 ml air removed from bilateral groin PADs. No bleeding at sites, Patient tolerated well.

## 2020-05-06 NOTE — Anesthesia Postprocedure Evaluation (Signed)
Anesthesia Post Note  [name]  Procedure(s) Performed: ENDOVASCULAR REPAIR/STENT GRAFT (N/A )  Patient location during evaluation: PACU Anesthesia Type: General Level of consciousness: awake and alert Pain management: pain level controlled Vital Signs Assessment: post-procedure vital signs reviewed and stable Respiratory status: spontaneous breathing, nonlabored ventilation and respiratory function stable Cardiovascular status: blood pressure returned to baseline and stable Postop Assessment: no apparent nausea or vomiting Anesthetic complications: no   No complications documented.   Last Vitals:  Vitals:   05/06/20 0645 05/06/20 0846  BP: 120/61 118/64  Pulse: 62   Resp: 15   Temp:    SpO2: 97%     Last Pain:  Vitals:   05/06/20 0420  TempSrc:   PainSc: [place]

## 2020-05-06 NOTE — Progress Notes (Signed)
At 0925 Removed 10 ml air removed from bilateral groin PADs. No bleeding at sites, Patient tolerated well.

## 2020-05-06 NOTE — OR Nursing (Addendum)
10 ml air removed from bilateral groin PADs. No bleeding at sites, Patient tolerated well.

## 2020-05-06 NOTE — Progress Notes (Signed)
10 ml air removed from bilateral groin PADs. Pt tolerated well. No bleeding at sites.

## 2020-05-07 DIAGNOSIS — N1832 Chronic kidney disease, stage 3b: Secondary | ICD-10-CM | POA: Diagnosis not present

## 2020-05-07 DIAGNOSIS — Z96653 Presence of artificial knee joint, bilateral: Secondary | ICD-10-CM | POA: Diagnosis not present

## 2020-05-07 DIAGNOSIS — E669 Obesity, unspecified: Secondary | ICD-10-CM | POA: Diagnosis not present

## 2020-05-07 DIAGNOSIS — G4733 Obstructive sleep apnea (adult) (pediatric): Secondary | ICD-10-CM | POA: Diagnosis not present

## 2020-05-07 DIAGNOSIS — M545 Low back pain: Secondary | ICD-10-CM | POA: Diagnosis not present

## 2020-05-07 DIAGNOSIS — E785 Hyperlipidemia, unspecified: Secondary | ICD-10-CM | POA: Diagnosis not present

## 2020-05-07 DIAGNOSIS — Z955 Presence of coronary angioplasty implant and graft: Secondary | ICD-10-CM | POA: Diagnosis not present

## 2020-05-07 DIAGNOSIS — I5032 Chronic diastolic (congestive) heart failure: Secondary | ICD-10-CM | POA: Diagnosis not present

## 2020-05-07 DIAGNOSIS — I83893 Varicose veins of bilateral lower extremities with other complications: Secondary | ICD-10-CM | POA: Diagnosis not present

## 2020-05-07 DIAGNOSIS — I714 Abdominal aortic aneurysm, without rupture: Secondary | ICD-10-CM | POA: Diagnosis not present

## 2020-05-07 DIAGNOSIS — Z8701 Personal history of pneumonia (recurrent): Secondary | ICD-10-CM | POA: Diagnosis not present

## 2020-05-07 DIAGNOSIS — E039 Hypothyroidism, unspecified: Secondary | ICD-10-CM | POA: Diagnosis not present

## 2020-05-07 DIAGNOSIS — Z48812 Encounter for surgical aftercare following surgery on the circulatory system: Secondary | ICD-10-CM | POA: Diagnosis not present

## 2020-05-07 DIAGNOSIS — I83813 Varicose veins of bilateral lower extremities with pain: Secondary | ICD-10-CM | POA: Diagnosis not present

## 2020-05-07 DIAGNOSIS — Z6841 Body Mass Index (BMI) 40.0 and over, adult: Secondary | ICD-10-CM | POA: Diagnosis not present

## 2020-05-07 DIAGNOSIS — Z85828 Personal history of other malignant neoplasm of skin: Secondary | ICD-10-CM | POA: Diagnosis not present

## 2020-05-07 DIAGNOSIS — G8929 Other chronic pain: Secondary | ICD-10-CM | POA: Diagnosis not present

## 2020-05-07 DIAGNOSIS — I2510 Atherosclerotic heart disease of native coronary artery without angina pectoris: Secondary | ICD-10-CM | POA: Diagnosis not present

## 2020-05-07 DIAGNOSIS — I130 Hypertensive heart and chronic kidney disease with heart failure and stage 1 through stage 4 chronic kidney disease, or unspecified chronic kidney disease: Secondary | ICD-10-CM | POA: Diagnosis not present

## 2020-05-10 DIAGNOSIS — I5032 Chronic diastolic (congestive) heart failure: Secondary | ICD-10-CM | POA: Diagnosis not present

## 2020-05-10 DIAGNOSIS — E669 Obesity, unspecified: Secondary | ICD-10-CM | POA: Diagnosis not present

## 2020-05-10 DIAGNOSIS — I130 Hypertensive heart and chronic kidney disease with heart failure and stage 1 through stage 4 chronic kidney disease, or unspecified chronic kidney disease: Secondary | ICD-10-CM | POA: Diagnosis not present

## 2020-05-10 DIAGNOSIS — Z48812 Encounter for surgical aftercare following surgery on the circulatory system: Secondary | ICD-10-CM | POA: Diagnosis not present

## 2020-05-10 DIAGNOSIS — N1832 Chronic kidney disease, stage 3b: Secondary | ICD-10-CM | POA: Diagnosis not present

## 2020-05-10 DIAGNOSIS — I714 Abdominal aortic aneurysm, without rupture: Secondary | ICD-10-CM | POA: Diagnosis not present

## 2020-05-12 DIAGNOSIS — E669 Obesity, unspecified: Secondary | ICD-10-CM | POA: Diagnosis not present

## 2020-05-12 DIAGNOSIS — I5032 Chronic diastolic (congestive) heart failure: Secondary | ICD-10-CM | POA: Diagnosis not present

## 2020-05-12 DIAGNOSIS — Z48812 Encounter for surgical aftercare following surgery on the circulatory system: Secondary | ICD-10-CM | POA: Diagnosis not present

## 2020-05-12 DIAGNOSIS — I714 Abdominal aortic aneurysm, without rupture: Secondary | ICD-10-CM | POA: Diagnosis not present

## 2020-05-12 DIAGNOSIS — I130 Hypertensive heart and chronic kidney disease with heart failure and stage 1 through stage 4 chronic kidney disease, or unspecified chronic kidney disease: Secondary | ICD-10-CM | POA: Diagnosis not present

## 2020-05-12 DIAGNOSIS — N1832 Chronic kidney disease, stage 3b: Secondary | ICD-10-CM | POA: Diagnosis not present

## 2020-05-14 DIAGNOSIS — E669 Obesity, unspecified: Secondary | ICD-10-CM | POA: Diagnosis not present

## 2020-05-14 DIAGNOSIS — I5032 Chronic diastolic (congestive) heart failure: Secondary | ICD-10-CM | POA: Diagnosis not present

## 2020-05-14 DIAGNOSIS — I714 Abdominal aortic aneurysm, without rupture: Secondary | ICD-10-CM | POA: Diagnosis not present

## 2020-05-14 DIAGNOSIS — Z48812 Encounter for surgical aftercare following surgery on the circulatory system: Secondary | ICD-10-CM | POA: Diagnosis not present

## 2020-05-14 DIAGNOSIS — I130 Hypertensive heart and chronic kidney disease with heart failure and stage 1 through stage 4 chronic kidney disease, or unspecified chronic kidney disease: Secondary | ICD-10-CM | POA: Diagnosis not present

## 2020-05-14 DIAGNOSIS — N1832 Chronic kidney disease, stage 3b: Secondary | ICD-10-CM | POA: Diagnosis not present

## 2020-05-18 DIAGNOSIS — I130 Hypertensive heart and chronic kidney disease with heart failure and stage 1 through stage 4 chronic kidney disease, or unspecified chronic kidney disease: Secondary | ICD-10-CM | POA: Diagnosis not present

## 2020-05-18 DIAGNOSIS — I5032 Chronic diastolic (congestive) heart failure: Secondary | ICD-10-CM | POA: Diagnosis not present

## 2020-05-18 DIAGNOSIS — Z48812 Encounter for surgical aftercare following surgery on the circulatory system: Secondary | ICD-10-CM | POA: Diagnosis not present

## 2020-05-18 DIAGNOSIS — E669 Obesity, unspecified: Secondary | ICD-10-CM | POA: Diagnosis not present

## 2020-05-18 DIAGNOSIS — N1832 Chronic kidney disease, stage 3b: Secondary | ICD-10-CM | POA: Diagnosis not present

## 2020-05-18 DIAGNOSIS — I714 Abdominal aortic aneurysm, without rupture: Secondary | ICD-10-CM | POA: Diagnosis not present

## 2020-05-20 DIAGNOSIS — I5032 Chronic diastolic (congestive) heart failure: Secondary | ICD-10-CM | POA: Diagnosis not present

## 2020-05-20 DIAGNOSIS — I130 Hypertensive heart and chronic kidney disease with heart failure and stage 1 through stage 4 chronic kidney disease, or unspecified chronic kidney disease: Secondary | ICD-10-CM | POA: Diagnosis not present

## 2020-05-20 DIAGNOSIS — Z48812 Encounter for surgical aftercare following surgery on the circulatory system: Secondary | ICD-10-CM | POA: Diagnosis not present

## 2020-05-20 DIAGNOSIS — I714 Abdominal aortic aneurysm, without rupture: Secondary | ICD-10-CM | POA: Diagnosis not present

## 2020-05-20 DIAGNOSIS — E669 Obesity, unspecified: Secondary | ICD-10-CM | POA: Diagnosis not present

## 2020-05-20 DIAGNOSIS — N1832 Chronic kidney disease, stage 3b: Secondary | ICD-10-CM | POA: Diagnosis not present

## 2020-05-27 ENCOUNTER — Other Ambulatory Visit (INDEPENDENT_AMBULATORY_CARE_PROVIDER_SITE_OTHER): Payer: Self-pay | Admitting: Vascular Surgery

## 2020-05-27 DIAGNOSIS — Z9889 Other specified postprocedural states: Secondary | ICD-10-CM

## 2020-05-27 DIAGNOSIS — I714 Abdominal aortic aneurysm, without rupture, unspecified: Secondary | ICD-10-CM

## 2020-05-27 DIAGNOSIS — Z8679 Personal history of other diseases of the circulatory system: Secondary | ICD-10-CM

## 2020-06-03 ENCOUNTER — Ambulatory Visit (INDEPENDENT_AMBULATORY_CARE_PROVIDER_SITE_OTHER): Payer: Medicare Other

## 2020-06-03 ENCOUNTER — Other Ambulatory Visit: Payer: Self-pay

## 2020-06-03 ENCOUNTER — Ambulatory Visit (INDEPENDENT_AMBULATORY_CARE_PROVIDER_SITE_OTHER): Payer: Medicare Other | Admitting: Nurse Practitioner

## 2020-06-03 ENCOUNTER — Encounter (INDEPENDENT_AMBULATORY_CARE_PROVIDER_SITE_OTHER): Payer: Self-pay | Admitting: Nurse Practitioner

## 2020-06-03 VITALS — BP 124/80 | HR 102 | Ht 72.0 in | Wt 297.0 lb

## 2020-06-03 DIAGNOSIS — I714 Abdominal aortic aneurysm, without rupture, unspecified: Secondary | ICD-10-CM

## 2020-06-03 DIAGNOSIS — Z9889 Other specified postprocedural states: Secondary | ICD-10-CM

## 2020-06-03 DIAGNOSIS — Z8679 Personal history of other diseases of the circulatory system: Secondary | ICD-10-CM | POA: Diagnosis not present

## 2020-06-03 DIAGNOSIS — I10 Essential (primary) hypertension: Secondary | ICD-10-CM

## 2020-06-03 DIAGNOSIS — E782 Mixed hyperlipidemia: Secondary | ICD-10-CM

## 2020-06-08 NOTE — Progress Notes (Signed)
Subjective:    Patient ID: Anthony [name], male    DOB: 17-Dec-1935, 84 y.o.   MRN: [id number] Chief Complaint  Patient presents with  . Follow-up    1 mo post endovascular repair /stent graft eval    The patient returns to the office for surveillance of an abdominal aortic aneurysm status post stent graft placement on 05/05/2020.   Patient denies abdominal pain or back pain, no other abdominal complaints. No groin related complaints. No symptoms consistent with distal embolization No changes in claudication distance.   There have been no interval changes in his overall healthcare since his last visit.   Patient denies amaurosis fugax or TIA symptoms. There is no history of claudication or rest pain symptoms of the lower extremities. The patient denies angina or shortness of breath.   Duplex US of the aorta and iliac arteries shows a 4.25 AAA sac with no endoleak, decrease in the sac compared to the previous study.   Review of Systems  Musculoskeletal: Positive for gait problem.  Neurological: Positive for tremors.  Hematological: Does not bruise/bleed easily.  All other systems reviewed and are negative.      Objective:   Physical Exam Vitals reviewed.  HENT:     Head: Normocephalic.  Cardiovascular:     Rate and Rhythm: Normal rate.     Pulses: Normal pulses.  Pulmonary:     Effort: Pulmonary effort is normal.  Neurological:     Mental Status: He is alert and oriented to person, place, and time.     Motor: Weakness present.     Gait: Gait abnormal.  Psychiatric:        Mood and Affect: Mood normal.        Behavior: Behavior normal.        Thought Content: Thought content normal.        Judgment: Judgment normal.     BP 124/80   Pulse (!) 102   Ht 6' (1.829 m)   Wt 297 lb (134.7 kg)   BMI 40.28 kg/m   Past Medical History:  Diagnosis Date  . Anosmia 1980s  . Atrial flutter ([place]) 2018   had an ablation with dr. Erick Alley  . CAD (coronary artery disease)     . Cancer ([place])    skin cancer  . Chronic constipation   . Chronic insomnia   . Chronic lower back pain    s/p spine stimulator placement  . Complication of anesthesia    when under general, he woke up agitated and unable to be held down  . Diastolic CHF ([place])   . History of diverticulitis 2017  . History of pneumonia 2013  . Hyperlipidemia   . Hypertension   . Hypothyroidism   . Obesity, Class II, BMI 35-39.9, with comorbidity   . OSA on CPAP   . PONV (postoperative nausea and vomiting)     Social History   Socioeconomic History  . Marital status: Widowed    Spouse name: Not on file  . Number of children: Not on file  . Years of education: Not on file  . Highest education level: Not on file  Occupational History  . Not on file  Tobacco Use  . Smoking status: Never Smoker  . Smokeless tobacco: Never Used  Vaping Use  . Vaping Use: Never used  Substance and Sexual Activity  . Alcohol use: No    Comment: QUIT DRINKING  . Drug use: No  . Sexual activity: Not Currently  Other Topics Concern  . Not on file  Social History Narrative   Widower (wife passed 08/2014).    Lives alone, GF Miss [name] sometimes (she was nurse)   Son is [name] (FP at [employer] [facility]). Other son is a Psychologist, sport and exercise in [place]: retired, used to [employer] store   Edu: [employer]   Social Determinants of Radio broadcast assistant Strain: [place]   . Difficulty of Paying Living Expenses: Not hard at all  Food Insecurity: No Food Insecurity  . Worried About Charity fundraiser in the Last Year: Never true  . Ran Out of Food in the Last Year: Never true  Transportation Needs: No Transportation Needs  . Lack of Transportation (Medical): No  . Lack of Transportation (Non-Medical): No  Physical Activity: Sufficiently Active  . Days of Exercise per Week: 7 days  . Minutes of Exercise per Session: 60 min  Stress: No Stress Concern Present  . Feeling of Stress : Not at all  Social  Connections:   . Frequency of Communication with Friends and Family: Not on file  . Frequency of Social Gatherings with Friends and Family: Not on file  . Attends Religious Services: Not on file  . Active Member of Clubs or Organizations: Not on file  . Attends Archivist Meetings: Not on file  . Marital Status: Not on file  Intimate Partner Violence: Not At Risk  . Fear of Current or Ex-Partner: No  . Emotionally Abused: No  . Physically Abused: No  . Sexually Abused: No    Past Surgical History:  Procedure Laterality Date  . A-FLUTTER ABLATION N/A 05/07/2017   Procedure: A-Flutter Ablation;  Surgeon: Deboraha Sprang, MD;  Location: [facility];  Service: Cardiovascular;  Laterality: N/A;  . CARDIOVASCULAR STRESS TEST  11/2017   no ischemia, low risk study  . COLONOSCOPY  2007   normal per prior PCP records, rpt 10 yrs (Dr Osie Cheeks)  . ELBOW SURGERY Right    ulnar nerve decompression.  PT DOES NOT RECALL THIS PROCEDURE  . ENDOVASCULAR REPAIR/STENT GRAFT N/A 05/05/2020   Procedure: ENDOVASCULAR REPAIR/STENT GRAFT;  Surgeon: Algernon Huxley, MD;  Location: [facility];  Service: Cardiovascular;  Laterality: N/A;  . LAMINECTOMY THORACIC SPINE W/ PLACEMENT SPINAL CORD STIMULATOR  08/2018   and removal of prior spine stimulator (Dr Lacinda Axon)  . NASAL SINUS SURGERY     nasal polyps. done a long time ago  . PERCUTANEOUS CORONARY STENT INTERVENTION (PCI-S)  2013   EF55%, 70% mid LAD, 99% mid RCA, mild MR, elev LVEDP, DES to mid LAD. RCA is nondominant  . RADIOFREQUENCY ABLATION  06/2017   lumbar region [facility])   . REPLACEMENT TOTAL KNEE BILATERAL Bilateral 2000s  . SPINAL CORD STIMULATOR IMPLANT  06/2016  . SPINAL CORD STIMULATOR INSERTION N/A 09/09/2018   Procedure: LUMBAR SPINAL CORD STIMULATOR LEAD AND BATTERY REMOVAL AND LAMINECTOMY FOR PLACEMENT OF PADDLE;  Surgeon: Deetta Perla, MD;  Location: [facility];  Service: Neurosurgery;  Laterality: N/A;  . SPINAL CORD  STIMULATOR INSERTION N/A 09/16/2018   Procedure: PLACEMENT OF SPINAL CORD STIMULATOR BATTERY;  Surgeon: Deetta Perla, MD;  Location: [facility];  Service: Neurosurgery;  Laterality: N/A;    Family History  Problem Relation Age of Onset  . Stroke Mother   . Kidney disease Father   . Leukemia Brother   . Stroke Sister   . Diabetes Neg Hx  Allergies  Allergen Reactions  . Questran [Cholestyramine]     Patient not aware of an allergy to this medicine.  . Atorvastatin     Muscle pain  . Gemfibrozil Other (See Comments)    Muscle pain.  . Metformin And Related     Dizziness  . Rosuvastatin     Muscle pain       Assessment & Plan:   1. AAA (abdominal aortic aneurysm) without rupture (HCC) Recommend: Patient is status post successful endovascular repair of the AAA.   No further intervention is required at this time.   No endoleak is detected and the aneurysm sac is stable.  The patient will continue antiplatelet therapy as prescribed as well as aggressive management of hyperlipidemia. Exercise is again strongly encouraged.   However, endografts require continued surveillance with ultrasound or CT scan. This is mandatory to detect any changes that allow repressurization of the aneurysm sac.  The patient is informed that this would be asymptomatic.  The patient is reminded that lifelong routine surveillance is a necessity with an endograft. Patient will continue to follow-up at 6 month intervals with ultrasound of the aorta.  2. Hypercholesterolemia with hypertriglyceridemia Continue statin as ordered and reviewed, no changes at this time   3. Essential hypertension Continue antihypertensive medications as already ordered, these medications have been reviewed and there are no changes at this time.    Current Outpatient Medications on File Prior to Visit  Medication Sig Dispense Refill  . acetaminophen (TYLENOL) 500 MG tablet Take 1 tablet (500 mg total) by mouth 2 (two)  times daily as needed for moderate pain.    . Alirocumab (PRALUENT) 75 MG/ML SOAJ Inject 1 pen into the skin every 14 (fourteen) days. 2 pen 11  . amLODipine (NORVASC) 5 MG tablet Take 1 tablet (5 mg total) by mouth daily. (Patient taking differently: Take 5 mg by mouth every morning. ) 30 tablet 6  . aspirin EC 81 MG tablet Take 1 tablet (81 mg total) by mouth daily.    . Azelastine HCl 0.15 % SOLN as needed.     Marland Kitchen buPROPion (WELLBUTRIN SR) 100 MG 12 hr tablet Take 100 mg by mouth 2 (two) times daily.    . Cholecalciferol (VITAMIN D3) 1000 units CAPS Take 1 capsule (1,000 Units total) by mouth daily. 30 capsule   . clopidogrel (PLAVIX) 75 MG tablet Take 1 tablet (75 mg total) by mouth daily at 6 (six) AM. 90 tablet 3  . diphenhydramine-acetaminophen (TYLENOL PM) 25-500 MG TABS tablet Take 1 tablet by mouth at bedtime.    . docusate sodium (COLACE) 100 MG capsule Take 100 mg by mouth daily.     Marland Kitchen ezetimibe (ZETIA) 10 MG tablet TAKE 1 TABLET(10 MG) BY MOUTH DAILY (Patient taking differently: Take 10 mg by mouth every morning. TAKE 1 TABLET(10 MG) BY MOUTH DAILY) 90 tablet 3  . fenofibrate 160 MG tablet TAKE 1 TABLET BY MOUTH EVERY DAY (Patient taking differently: Take 160 mg by mouth every morning. ) 90 tablet 3  . finasteride (PROSCAR) 5 MG tablet TAKE 1 TABLET(5 MG) BY MOUTH DAILY (Patient taking differently: Take 5 mg by mouth every morning. ) 90 tablet 1  . fluocinonide (LIDEX) 0.05 % external solution Apply 1 application topically 2 (two) times daily as needed. (Patient taking differently: Apply 1 application topically as needed. ) 60 mL 2  . furosemide (LASIX) 40 MG tablet TAKE 1 TABLET BY MOUTH EVERY DAY. TAKE AN EXTRA TABLET AS NEEDED  FOR WEIGHT GAIN OF 3 LBS/DAY OR 5LBS/WEEK 135 tablet 3  . gabapentin (NEURONTIN) 300 MG capsule Take 2 capsules (600 mg total) by mouth at bedtime. With extra tablet during the day as needed 200 capsule 1  . HYDROcodone-acetaminophen (NORCO) 10-325 MG tablet Take  1 tablet by mouth every 8 (eight) hours as needed. 20 tablet 0  . Icosapent Ethyl (VASCEPA) 1 g CAPS Take 2 capsules (2 g total) by mouth 2 (two) times daily. 120 capsule 11  . ketoconazole (NIZORAL) 2 % cream Apply 1 application topically daily as needed (for dermatitits).     Marland Kitchen ketoconazole (NIZORAL) 2 % shampoo Apply topically as directed. Apply 3 times weekly 120 mL 2  . levothyroxine (SYNTHROID) 175 MCG tablet TAKE 1 TABLET(175 MCG) BY MOUTH DAILY BEFORE BREAKFAST 90 tablet 3  . LINZESS 290 MCG CAPS capsule TAKE 1 CAPSULE(290 MCG) BY MOUTH DAILY 30 MINUTES BEFORE FIRST MEAL OF THE DAY ON AN EMPTY STOMACH 90 capsule 1  . lisinopril (ZESTRIL) 20 MG tablet Take 1 tablet (20 mg total) by mouth daily. 90 tablet 1  . loratadine (CLARITIN) 10 MG tablet Take 1 tablet (10 mg total) by mouth daily.    . Multiple Vitamins-Minerals (CENTRUM SILVER PO) Take 1 tablet by mouth daily.    . Multiple Vitamins-Minerals (PRESERVISION AREDS 2 PO) Take 1 tablet by mouth daily.    . nitroGLYCERIN (NITROSTAT) 0.4 MG SL tablet Place 1 tablet (0.4 mg total) under the tongue every 5 (five) minutes as needed for chest pain. 25 tablet 0  . tamsulosin (FLOMAX) 0.4 MG CAPS capsule TAKE 2 CAPSULES(0.8 MG) BY MOUTH DAILY (Patient taking differently: Take 0.8 mg by mouth daily after supper. ) 180 capsule 1  . tiZANidine (ZANAFLEX) 2 MG tablet Take 1-2 tablets (2-4 mg total) by mouth 2 (two) times daily as needed for muscle spasms. 30 tablet 0   No current facility-administered medications on file prior to visit.    There are no Patient Instructions on file for this visit. No follow-ups on file.   Kris Hartmann, NP

## 2020-06-09 ENCOUNTER — Encounter (INDEPENDENT_AMBULATORY_CARE_PROVIDER_SITE_OTHER): Payer: Self-pay | Admitting: Nurse Practitioner

## 2020-06-10 ENCOUNTER — Other Ambulatory Visit: Payer: Self-pay | Admitting: Internal Medicine

## 2020-06-24 ENCOUNTER — Ambulatory Visit: Payer: Medicare Other | Admitting: Internal Medicine

## 2020-06-30 ENCOUNTER — Other Ambulatory Visit: Payer: Self-pay

## 2020-06-30 ENCOUNTER — Ambulatory Visit (INDEPENDENT_AMBULATORY_CARE_PROVIDER_SITE_OTHER): Payer: Medicare Other | Admitting: Family Medicine

## 2020-06-30 ENCOUNTER — Encounter: Payer: Self-pay | Admitting: Family Medicine

## 2020-06-30 VITALS — BP 118/78 | HR 105 | Temp 97.6°F | Ht 72.0 in | Wt 295.6 lb

## 2020-06-30 DIAGNOSIS — M545 Low back pain, unspecified: Secondary | ICD-10-CM

## 2020-06-30 DIAGNOSIS — M25571 Pain in right ankle and joints of right foot: Secondary | ICD-10-CM

## 2020-06-30 DIAGNOSIS — G250 Essential tremor: Secondary | ICD-10-CM | POA: Diagnosis not present

## 2020-06-30 DIAGNOSIS — I714 Abdominal aortic aneurysm, without rupture, unspecified: Secondary | ICD-10-CM

## 2020-06-30 DIAGNOSIS — Z23 Encounter for immunization: Secondary | ICD-10-CM

## 2020-06-30 DIAGNOSIS — G8929 Other chronic pain: Secondary | ICD-10-CM

## 2020-06-30 DIAGNOSIS — I2510 Atherosclerotic heart disease of native coronary artery without angina pectoris: Secondary | ICD-10-CM

## 2020-06-30 DIAGNOSIS — I10 Essential (primary) hypertension: Secondary | ICD-10-CM | POA: Diagnosis not present

## 2020-06-30 DIAGNOSIS — Z9689 Presence of other specified functional implants: Secondary | ICD-10-CM | POA: Diagnosis not present

## 2020-06-30 DIAGNOSIS — N1832 Chronic kidney disease, stage 3b: Secondary | ICD-10-CM

## 2020-06-30 NOTE — Assessment & Plan Note (Addendum)
Update labs today.  Continue to encourage good hydration status.  He did undergo CTA 03/2020.

## 2020-06-30 NOTE — Assessment & Plan Note (Signed)
S/p endovascular repair 04/2020, doing well. Aware will need lifelong surveillance.

## 2020-06-30 NOTE — Patient Instructions (Addendum)
Flu shot today.  Labs today. This will determine plan for blood pressure medicines.  Given back pain is improved, try dropping gabapentin to 300mg  one tablet at bedtime. Monitor effect on tremor.  Use ice/heating pad (covered in a towel) a few times a day. May try tylenol 500mg  1-2 tablets twice daily as needed. May try small amounts of voltaren (diclofenac) anti inflammatory gel (sold over the counter) or icy hot or aspercream twice daily to ankle for 1 week.

## 2020-06-30 NOTE — Assessment & Plan Note (Signed)
Chronic, stable. Well controlled on current regimen however now with endorsed dizziness would consider tapering dose. Check labs today, then decide on antihypertensive dosing.

## 2020-06-30 NOTE — Assessment & Plan Note (Addendum)
Actually he has noted significant improvement in his lower back pain after AAA endovascular repair.  No longer using hydrocodone.  Discussed tapering dose of gabapentin.

## 2020-06-30 NOTE — Progress Notes (Signed)
This visit was conducted in person.  BP 118/78 (BP Location: Left Arm, Patient Position: Sitting, Cuff Size: Large)   Pulse (!) 105   Temp 97.6 F (36.4 C) (Temporal)   Ht 6' (1.829 m)   Wt 295 lb 9 oz (134.1 kg)   SpO2 95%   BMI 40.09 kg/m   BP Readings from Last 3 Encounters:  06/30/20 118/78  06/03/20 124/80  05/06/20 (!) 146/84    Pulse Readings from Last 3 Encounters:  06/30/20 (!) 105  06/03/20 (!) 102  05/06/20 89   CC: R ankle pain Subjective:    Patient ID: Anthony [name], male    DOB: 1936/05/21, 84 y.o.   MRN: [id number]  HPI: Anthony [name] is a 84 y.o. male presenting on 06/30/2020 for Ankle Pain (C/o right ankle pain, especially when standing.  Started 2-3 wks ago.  Denies injury.)   See prior note for details.  To summarize, seen here 03/2020 with presumed persistent L lumbar radiculopathy not improving with PT course, in known spinal stenosis history. Lumbar films incidentally revealed enlarged AAA to 6.2cm. STAT AAA [name] measured 5cm distal AAA. Referred urgently to [facility] s/p CTA abdomen/pelvis where AAA measured 4.8cm, mild aortic and iliac ATH. Incidentally found indeterminate lesions to R kidney. Suggested MRI vs [name] surveillance.   He underwent AAA stent graft placement 04/2020 and has done well in the interim. Aware will need lifelong surveillance.   Lower back pain has significantly improved since endovascular AAA repair. Continues gabapentin 600mg  at night time. Fully off hydrocodone.   I ordered lumbar MRI however this was not able to be completed due to spinal stimulator status. Now with improvement in pain, desires to cancel at this time.   HTN - current regimen is amlodipine 5mg  daily, lasix 40mg  daily, lisinporil 20mg  daily. Notes mild orthostatic dizziness. Mild tachycardia noted - feels he's staying well hydrated. Doesn't regularly drink caffeine.   He continues plavix and aspirin as well as praluent, fenofibrate, zetia, and vascepa for cholesterol.    2-3 wk h/o R ankle pain with standing and walking, better when he sits down. Denies inciting trauma/injury or fall. It is improving. Icy hot helps.      Relevant past medical, surgical, family and social history reviewed and updated as indicated. Interim medical history since our last visit reviewed. Allergies and medications reviewed and updated. Outpatient Medications Prior to Visit  Medication Sig Dispense Refill  . acetaminophen (TYLENOL) 500 MG tablet Take 1 tablet (500 mg total) by mouth 2 (two) times daily as needed for moderate pain.    Marland Kitchen amLODipine (NORVASC) 5 MG tablet Take 1 tablet (5 mg total) by mouth daily. (Patient taking differently: Take 5 mg by mouth every morning. ) 30 tablet 6  . aspirin EC 81 MG tablet Take 1 tablet (81 mg total) by mouth daily.    . Azelastine HCl 0.15 % SOLN as needed.     Marland Kitchen buPROPion (WELLBUTRIN SR) 100 MG 12 hr tablet Take 100 mg by mouth 2 (two) times daily.    . Cholecalciferol (VITAMIN D3) 1000 units CAPS Take 1 capsule (1,000 Units total) by mouth daily. 30 capsule   . clopidogrel (PLAVIX) 75 MG tablet Take 1 tablet (75 mg total) by mouth daily at 6 (six) AM. 90 tablet 3  . diphenhydramine-acetaminophen (TYLENOL PM) 25-500 MG TABS tablet Take 1 tablet by mouth at bedtime.    . docusate sodium (COLACE) 100 MG capsule Take 100 mg by mouth daily.     Marland Kitchen  ezetimibe (ZETIA) 10 MG tablet TAKE 1 TABLET(10 MG) BY MOUTH DAILY (Patient taking differently: Take 10 mg by mouth every morning. TAKE 1 TABLET(10 MG) BY MOUTH DAILY) 90 tablet 3  . fenofibrate 160 MG tablet TAKE 1 TABLET BY MOUTH EVERY DAY (Patient taking differently: Take 160 mg by mouth every morning. ) 90 tablet 3  . finasteride (PROSCAR) 5 MG tablet TAKE 1 TABLET(5 MG) BY MOUTH DAILY (Patient taking differently: Take 5 mg by mouth every morning. ) 90 tablet 1  . fluocinonide (LIDEX) 0.05 % external solution Apply 1 application topically 2 (two) times daily as needed. (Patient taking differently:  Apply 1 application topically as needed. ) 60 mL 2  . furosemide (LASIX) 40 MG tablet TAKE 1 TABLET BY MOUTH EVERY DAY. TAKE AN EXTRA TABLET AS NEEDED FOR WEIGHT GAIN OF 3 LBS/DAY OR 5LBS/WEEK 135 tablet 3  . gabapentin (NEURONTIN) 300 MG capsule Take 2 capsules (600 mg total) by mouth at bedtime. With extra tablet during the day as needed 200 capsule 1  . HYDROcodone-acetaminophen (NORCO) 10-325 MG tablet Take 1 tablet by mouth every 8 (eight) hours as needed. 20 tablet 0  . icosapent Ethyl (VASCEPA) 1 g capsule TAKE 2 CAPSULES(2 GRAMS) BY MOUTH TWICE DAILY 120 capsule 8  . ketoconazole (NIZORAL) 2 % cream Apply 1 application topically daily as needed (for dermatitits).     Marland Kitchen ketoconazole (NIZORAL) 2 % shampoo Apply topically as directed. Apply 3 times weekly 120 mL 2  . levothyroxine (SYNTHROID) 175 MCG tablet TAKE 1 TABLET(175 MCG) BY MOUTH DAILY BEFORE BREAKFAST 90 tablet 3  . LINZESS 290 MCG CAPS capsule TAKE 1 CAPSULE(290 MCG) BY MOUTH DAILY 30 MINUTES BEFORE FIRST MEAL OF THE DAY ON AN EMPTY STOMACH 90 capsule 1  . lisinopril (ZESTRIL) 20 MG tablet Take 1 tablet (20 mg total) by mouth daily. 90 tablet 1  . loratadine (CLARITIN) 10 MG tablet Take 1 tablet (10 mg total) by mouth daily.    . Multiple Vitamins-Minerals (CENTRUM SILVER PO) Take 1 tablet by mouth daily.    . Multiple Vitamins-Minerals (PRESERVISION AREDS 2 PO) Take 1 tablet by mouth daily.    . nitroGLYCERIN (NITROSTAT) 0.4 MG SL tablet Place 1 tablet (0.4 mg total) under the tongue every 5 (five) minutes as needed for chest pain. 25 tablet 0  . PRALUENT 75 MG/ML SOAJ INJECT 1 PEN INTO THE SKIN EVERY 14 DAYS 2 mL 9  . tamsulosin (FLOMAX) 0.4 MG CAPS capsule TAKE 2 CAPSULES(0.8 MG) BY MOUTH DAILY (Patient taking differently: Take 0.8 mg by mouth daily after supper. ) 180 capsule 1  . tiZANidine (ZANAFLEX) 2 MG tablet Take 1-2 tablets (2-4 mg total) by mouth 2 (two) times daily as needed for muscle spasms. 30 tablet 0   No  facility-administered medications prior to visit.     Per HPI unless specifically indicated in ROS section below Review of Systems Objective:  BP 118/78 (BP Location: Left Arm, Patient Position: Sitting, Cuff Size: Large)   Pulse (!) 105   Temp 97.6 F (36.4 C) (Temporal)   Ht 6' (1.829 m)   Wt 295 lb 9 oz (134.1 kg)   SpO2 95%   BMI 40.09 kg/m   Wt Readings from Last 3 Encounters:  06/30/20 295 lb 9 oz (134.1 kg)  06/03/20 297 lb (134.7 kg)  05/05/20 295 lb (133.8 kg)      Physical Exam    Results for orders placed or performed during the hospital encounter of  05/05/20  CBC  Result Value Ref Range   WBC 12.3 (H) 4.0 - 10.5 K/uL   RBC 3.37 (L) 4.22 - 5.81 MIL/uL   Hemoglobin 10.8 (L) 13.0 - 17.0 g/dL   HCT 32.1 (L) 39 - 52 %   MCV 95.3 80.0 - 100.0 fL   MCH 32.0 26.0 - 34.0 pg   MCHC 33.6 30.0 - 36.0 g/dL   RDW 14.6 11.5 - 15.5 %   Platelets 204 150 - 400 K/uL   nRBC 0.0 0.0 - 0.2 %  Basic metabolic panel  Result Value Ref Range   Sodium 138 135 - 145 mmol/L   Potassium 4.2 3.5 - 5.1 mmol/L   Chloride 105 98 - 111 mmol/L   CO2 25 22 - 32 mmol/L   Glucose, Bld 129 (H) 70 - 99 mg/dL   BUN 31 (H) 8 - 23 mg/dL   Creatinine, Ser 1.61 (H) 0.61 - 1.24 mg/dL   Calcium 9.2 8.9 - 10.3 mg/dL   GFR calc non Af Amer 39 (L) >60 mL/min   GFR calc Af Amer 45 (L) >60 mL/min   Anion gap 8 5 - 15   Lab Results  Component Value Date   TSH 2.88 10/30/2019   Assessment & Plan:  This visit occurred during the SARS-CoV-2 public health emergency.  Safety protocols were in place, including screening questions prior to the visit, additional usage of staff PPE, and extensive cleaning of exam room while observing appropriate contact time as indicated for disinfecting solutions.   Problem List Items Addressed This Visit    S/P insertion of spinal cord stimulator   Pain in lateral portion of right ankle - Primary    With swelling on exam. ?ankle sprain although pt denies injury.  ?arthritis related. As improving, will continue conservative management - discussed ice/heat, discussed OTC voltaren gel. Update if not improving with this.       Essential tremor    Monitor effect on tremor if he drops gabapentin dose.       Essential hypertension    Chronic, stable. Well controlled on current regimen however now with endorsed dizziness would consider tapering dose. Check labs today, then decide on antihypertensive dosing.       CKD (chronic kidney disease) stage 3, GFR 30-59 ml/min (HCC)    Update labs today.  Continue to encourage good hydration status.  He did undergo CTA 03/2020.       Relevant Orders   Renal function panel   CBC with Differential/Platelet   Chronic lower back pain    Actually he has noted significant improvement in his lower back pain after AAA endovascular repair.  No longer using hydrocodone.  Discussed tapering dose of gabapentin.       Abdominal aortic aneurysm (AAA) without rupture [facility])    S/p endovascular repair 04/2020, doing well. Aware will need lifelong surveillance.        Other Visit Diagnoses    Need for influenza vaccination       Relevant Orders   Flu Vaccine QUAD High Dose(Fluad) (Completed)       No orders of the defined types were placed in this encounter.  Orders Placed This Encounter  Procedures  . Flu Vaccine QUAD High Dose(Fluad)  . Renal function panel  . CBC with Differential/Platelet    Patient Instructions  Flu shot today.  Labs today. This will determine plan for blood pressure medicines.  Given back pain is improved, try dropping gabapentin to 300mg  one  tablet at bedtime. Monitor effect on tremor.  Use ice/heating pad (covered in a towel) a few times a day. May try tylenol 500mg  1-2 tablets twice daily as needed. May try small amounts of voltaren (diclofenac) anti inflammatory gel (sold over the counter) or icy hot or aspercream twice daily to ankle for 1 week.   Follow up plan: Return in about 4  months (around 11/09/2020) for medicare wellness visit, follow up visit.  Ria Bush, MD

## 2020-06-30 NOTE — Assessment & Plan Note (Signed)
With swelling on exam. ?ankle sprain although pt denies injury. ?arthritis related. As improving, will continue conservative management - discussed ice/heat, discussed OTC voltaren gel. Update if not improving with this.

## 2020-06-30 NOTE — Assessment & Plan Note (Addendum)
Monitor effect on tremor if he drops gabapentin dose.

## 2020-07-01 ENCOUNTER — Other Ambulatory Visit: Payer: Self-pay | Admitting: Family Medicine

## 2020-07-01 LAB — CBC WITH DIFFERENTIAL/PLATELET
Basophils Absolute: 0.3 10*3/uL — ABNORMAL HIGH (ref 0.0–0.1)
Basophils Relative: 3.5 % — ABNORMAL HIGH (ref 0.0–3.0)
Eosinophils Absolute: 1 10*3/uL — ABNORMAL HIGH (ref 0.0–0.7)
Eosinophils Relative: 10.6 % — ABNORMAL HIGH (ref 0.0–5.0)
HCT: 40.3 % (ref 39.0–52.0)
Hemoglobin: 13.5 g/dL (ref 13.0–17.0)
Lymphocytes Relative: 29.5 % (ref 12.0–46.0)
Lymphs Abs: 2.8 10*3/uL (ref 0.7–4.0)
MCHC: 33.5 g/dL (ref 30.0–36.0)
MCV: 93.8 fl (ref 78.0–100.0)
Monocytes Absolute: 1 10*3/uL (ref 0.1–1.0)
Monocytes Relative: 10.9 % (ref 3.0–12.0)
Neutro Abs: 4.3 10*3/uL (ref 1.4–7.7)
Neutrophils Relative %: 45.5 % (ref 43.0–77.0)
Platelets: 325 10*3/uL (ref 150.0–400.0)
RBC: 4.3 Mil/uL (ref 4.22–5.81)
RDW: 14.5 % (ref 11.5–15.5)
WBC: 9.4 10*3/uL (ref 4.0–10.5)

## 2020-07-01 LAB — RENAL FUNCTION PANEL
Albumin: 4.2 g/dL (ref 3.5–5.2)
BUN: 29 mg/dL — ABNORMAL HIGH (ref 6–23)
CO2: 30 mEq/L (ref 19–32)
Calcium: 10.2 mg/dL (ref 8.4–10.5)
Chloride: 102 mEq/L (ref 96–112)
Creatinine, Ser: 2 mg/dL — ABNORMAL HIGH (ref 0.40–1.50)
GFR: 30.46 mL/min — ABNORMAL LOW (ref >60.00)
Glucose, Bld: 130 mg/dL — ABNORMAL HIGH (ref 70–99)
Phosphorus: 2.9 mg/dL (ref 2.3–4.6)
Potassium: 4.3 mEq/L (ref 3.5–5.1)
Sodium: 138 mEq/L (ref 135–145)

## 2020-07-01 NOTE — Telephone Encounter (Signed)
Looks like med was prescribed by [facility].  I don't on med list at 04/02/20.  However, pt confirmed yesterday's OV he was taking all meds on list.   Spoke with pt today to asking again if he is taking bupropion and pt states he's not sure and is asking if he should be taking.  Plz advise.

## 2020-07-03 ENCOUNTER — Other Ambulatory Visit: Payer: Self-pay | Admitting: Family Medicine

## 2020-07-03 DIAGNOSIS — D721 Eosinophilia, unspecified: Secondary | ICD-10-CM

## 2020-07-03 DIAGNOSIS — N1832 Chronic kidney disease, stage 3b: Secondary | ICD-10-CM

## 2020-07-03 DIAGNOSIS — E039 Hypothyroidism, unspecified: Secondary | ICD-10-CM

## 2020-07-03 MED ORDER — LISINOPRIL 10 MG PO TABS
10.0000 mg | ORAL_TABLET | Freq: Every day | ORAL | 1 refills | Status: DC
Start: 2020-07-03 — End: 2021-01-17

## 2020-07-03 NOTE — Progress Notes (Signed)
See recent results note.   Stage 3b chronic kidney disease ([place]) - Plan: US Renal, Renal function panel, VITAMIN D 25 Hydroxy (Vit-D Deficiency, Fractures), Parathyroid hormone, intact (no Ca)  Eosinophilia, unspecified type - Plan: Pathologist smear review  Hypothyroidism, unspecified type - Plan: TSH  Meds ordered this encounter  Medications  . lisinopril (ZESTRIL) 10 MG tablet    Sig: Take 1 tablet (10 mg total) by mouth daily.    Dispense:  90 tablet    Refill:  1    Note new sig    Orders Placed This Encounter  Procedures  . US Renal    Standing Status:   Future    Standing Expiration Date:   07/03/2021    Order Specific Question:   Reason for Exam (SYMPTOM  OR DIAGNOSIS REQUIRED)    Answer:   worsening kidney function AKI    Order Specific Question:   Preferred imaging location?    Answer:   [facility]  . Renal function panel    Standing Status:   Future    Standing Expiration Date:   07/03/2021  . VITAMIN D 25 Hydroxy (Vit-D Deficiency, Fractures)    Standing Status:   Future    Standing Expiration Date:   07/03/2021  . Pathologist smear review    Standing Status:   Future    Standing Expiration Date:   07/03/2021  . Parathyroid hormone, intact (no Ca)    Standing Status:   Future    Standing Expiration Date:   07/03/2021  . TSH    Standing Status:   Future    Standing Expiration Date:   07/03/2021

## 2020-07-05 ENCOUNTER — Ambulatory Visit (INDEPENDENT_AMBULATORY_CARE_PROVIDER_SITE_OTHER): Payer: Medicare Other | Admitting: Family Medicine

## 2020-07-05 ENCOUNTER — Other Ambulatory Visit: Payer: Self-pay

## 2020-07-05 ENCOUNTER — Other Ambulatory Visit: Payer: Self-pay | Admitting: Family Medicine

## 2020-07-05 DIAGNOSIS — Z23 Encounter for immunization: Secondary | ICD-10-CM

## 2020-07-06 NOTE — Telephone Encounter (Signed)
We were previously using this for weight loss.  If he's still taking, would suggest tapering off - drop to 1 tab daily for 2 wks then stop.  If he's no longer taking, ok to stay off this.

## 2020-07-08 NOTE — Telephone Encounter (Signed)
Spoke with pt asking about med.  States he is not taking at this time.

## 2020-07-12 ENCOUNTER — Other Ambulatory Visit: Payer: Self-pay | Admitting: Internal Medicine

## 2020-07-13 ENCOUNTER — Other Ambulatory Visit: Payer: Self-pay

## 2020-07-13 ENCOUNTER — Ambulatory Visit
Admission: RE | Admit: 2020-07-13 | Discharge: 2020-07-13 | Disposition: A | Discharge status: Home / Self Care | Payer: Medicare Other | Source: Ambulatory Visit | Attending: Family Medicine | Admitting: Family Medicine

## 2020-07-13 DIAGNOSIS — N1832 Chronic kidney disease, stage 3b: Secondary | ICD-10-CM | POA: Diagnosis not present

## 2020-07-13 DIAGNOSIS — N179 Acute kidney failure, unspecified: Secondary | ICD-10-CM | POA: Diagnosis not present

## 2020-07-19 MED ORDER — PRALUENT 75 MG/ML ~~LOC~~ SOAJ: SUBCUTANEOUS | 9 refills | Status: DC

## 2020-07-20 NOTE — Addendum Note (Signed)
Addended by: Ria Bush on: 07/20/2020 07:48 AM   Modules accepted: Orders

## 2020-07-21 ENCOUNTER — Other Ambulatory Visit (INDEPENDENT_AMBULATORY_CARE_PROVIDER_SITE_OTHER): Payer: Medicare Other

## 2020-07-21 ENCOUNTER — Other Ambulatory Visit: Payer: Self-pay

## 2020-07-21 DIAGNOSIS — D721 Eosinophilia, unspecified: Secondary | ICD-10-CM | POA: Diagnosis not present

## 2020-07-21 DIAGNOSIS — N1832 Chronic kidney disease, stage 3b: Secondary | ICD-10-CM | POA: Diagnosis not present

## 2020-07-21 DIAGNOSIS — E039 Hypothyroidism, unspecified: Secondary | ICD-10-CM

## 2020-07-21 LAB — RENAL FUNCTION PANEL
Albumin: 4.2 g/dL (ref 3.5–5.2)
BUN: 29 mg/dL — ABNORMAL HIGH (ref 6–23)
CO2: 30 mEq/L (ref 19–32)
Calcium: 10.5 mg/dL (ref 8.4–10.5)
Chloride: 100 mEq/L (ref 96–112)
Creatinine, Ser: 2.06 mg/dL — ABNORMAL HIGH (ref 0.40–1.50)
GFR: 29.39 mL/min — ABNORMAL LOW (ref >60.00)
Glucose, Bld: 126 mg/dL — ABNORMAL HIGH (ref 70–99)
Phosphorus: 2.7 mg/dL (ref 2.3–4.6)
Potassium: 4 mEq/L (ref 3.5–5.1)
Sodium: 136 mEq/L (ref 135–145)

## 2020-07-21 LAB — VITAMIN D 25 HYDROXY (VIT D DEFICIENCY, FRACTURES): VITD: 27.1 ng/mL — ABNORMAL LOW (ref 30.00–100.00)

## 2020-07-21 LAB — TSH: TSH: 3.5 u[IU]/mL (ref 0.35–4.50)

## 2020-07-21 NOTE — Addendum Note (Signed)
Addended by: Jacob Moores on: 07/21/2020 03:00 PM   Modules accepted: Orders

## 2020-07-21 NOTE — Addendum Note (Signed)
Addended by: Cloyd Stagers on: 07/21/2020 02:54 PM   Modules accepted: Orders

## 2020-07-23 ENCOUNTER — Other Ambulatory Visit: Payer: Self-pay | Admitting: Internal Medicine

## 2020-07-23 LAB — PROTEIN ELECTROPHORESIS, SERUM, WITH REFLEX
Albumin ELP: 4.3 g/dL (ref 3.8–4.8)
Alpha 1: 0.3 g/dL (ref 0.2–0.3)
Alpha 2: 0.6 g/dL (ref 0.5–0.9)
Beta 2: 0.3 g/dL (ref 0.2–0.5)
Beta Globulin: 0.4 g/dL (ref 0.4–0.6)
Gamma Globulin: 1.5 g/dL (ref 0.8–1.7)
Total Protein: 7.5 g/dL (ref 6.1–8.1)

## 2020-07-23 LAB — CBC WITH DIFFERENTIAL/PLATELET
Absolute Monocytes: 915 cells/uL (ref 200–950)
Basophils Absolute: 239 cells/uL — ABNORMAL HIGH (ref 0–200)
Basophils Relative: 2.3 %
Eosinophils Absolute: 1165 cells/uL — ABNORMAL HIGH (ref 15–500)
Eosinophils Relative: 11.2 %
HCT: 41 % (ref 38.5–50.0)
Hemoglobin: 13.9 g/dL (ref 13.2–17.1)
Lymphs Abs: 3349 cells/uL (ref 850–3900)
MCH: 31.7 pg (ref 27.0–33.0)
MCHC: 33.9 g/dL (ref 32.0–36.0)
MCV: 93.4 fL (ref 80.0–100.0)
MPV: 9.6 fL (ref 7.5–12.5)
Monocytes Relative: 8.8 %
Neutro Abs: 4732 cells/uL (ref 1500–7800)
Neutrophils Relative %: 45.5 %
Platelets: 320 10*3/uL (ref 140–400)
RBC: 4.39 10*6/uL (ref 4.20–5.80)
RDW: 13.3 % (ref 11.0–15.0)
Total Lymphocyte: 32.2 %
WBC: 10.4 10*3/uL (ref 3.8–10.8)

## 2020-07-23 LAB — PATHOLOGIST SMEAR REVIEW

## 2020-07-23 LAB — PARATHYROID HORMONE, INTACT (NO CA): PTH: 44 pg/mL (ref 14–64)

## 2020-07-24 ENCOUNTER — Other Ambulatory Visit: Payer: Self-pay | Admitting: Family Medicine

## 2020-07-26 ENCOUNTER — Other Ambulatory Visit: Payer: Self-pay | Admitting: Family Medicine

## 2020-07-26 NOTE — Telephone Encounter (Signed)
This is a [place] pt 

## 2020-07-26 NOTE — Telephone Encounter (Signed)
Gabapentin Last filled:  02/16/20, #200 Last OV:  06/30/20, back pain Next OV:  none

## 2020-08-11 ENCOUNTER — Other Ambulatory Visit: Payer: Self-pay

## 2020-08-11 NOTE — Telephone Encounter (Signed)
Patient called requesting refill on hydrocodone.  Debbora Dus, PharmD Clinical Pharmacist [facility] at [facility] [phone]

## 2020-08-12 ENCOUNTER — Telehealth: Payer: Self-pay

## 2020-08-12 NOTE — Telephone Encounter (Signed)
Name of Medication: Hydrocodone-APAP Name of Pharmacy: [facility] or Written Date and Quantity: 02/16/20, #20 Last Office Visit and Type: 06/30/20, right ankle pain Next Office Visit and Type: none Last Controlled Substance Agreement Date: 07/04/19 Last UDS: 07/04/19

## 2020-08-12 NOTE — Telephone Encounter (Signed)
Patient called 12/15 requesting hydrocodone refill. Telephone note did not go through initially.  Debbora Dus, PharmD Clinical Pharmacist [facility] at [facility] [phone]

## 2020-08-13 MED ORDER — HYDROCODONE-ACETAMINOPHEN 10-325 MG PO TABS: 1.0000 | ORAL_TABLET | Freq: Three times a day (TID) | ORAL | 0 refills | Status: DC | PRN

## 2020-08-13 NOTE — Telephone Encounter (Signed)
ERx 

## 2020-08-13 NOTE — Telephone Encounter (Signed)
Noted.  Request forwarded to Dr. Darnell Level on 08/11/20 to authorize.

## 2020-08-16 ENCOUNTER — Other Ambulatory Visit: Payer: Self-pay

## 2020-08-16 ENCOUNTER — Ambulatory Visit: Payer: Medicare Other

## 2020-08-16 ENCOUNTER — Telehealth: Payer: Self-pay

## 2020-08-16 DIAGNOSIS — E782 Mixed hyperlipidemia: Secondary | ICD-10-CM

## 2020-08-16 DIAGNOSIS — I10 Essential (primary) hypertension: Secondary | ICD-10-CM

## 2020-08-16 DIAGNOSIS — N1832 Chronic kidney disease, stage 3b: Secondary | ICD-10-CM

## 2020-08-16 NOTE — Telephone Encounter (Signed)
PCP consult regarding CCM 08/16/20:  Reviewed medications for potential de-prescribing. Pt is currently on fenofibrate 160 mg daily started prior to addition of Praluent [place] every 14 days, zetia 10 mg daily, and Vascepa. Did not find history of TG > 500 or pancreatitis. Recommend trial off therapy due to lack of CV benefit and renal function decline. Repeat lipid panel at next office visit.  Debbora Dus, PharmD Clinical Pharmacist [facility] at [facility] [phone]

## 2020-08-16 NOTE — Patient Instructions (Addendum)
Dear Anthony [name],  Below is a summary of the goals we discussed during our follow up appointment on August 16, 2020. Please contact me anytime with questions or concerns.   Visit Information  Goals Addressed            This Visit's Progress   . Pharmacy Care Plan       CARE PLAN ENTRY (see longitudinal plan of care for additional care plan information)  Current Barriers:  . Chronic Disease Management support, education, and care coordination needs related to Hypertension and Hyperlipidemia   Hypertension BP Readings from Last 3 Encounters:  06/30/20 118/78  06/03/20 124/80  05/06/20 (!) 146/84 .  Pharmacist Clinical Goal(s): o Over the next 6 months, patient will work with PharmD and providers to achieve BP goal <130/80 . Current regimen:   Lisinopril 10 mg - 1 tablet daily  Amlodipine 5 mg - 1 tablet daily . Interventions: o Reviewed home BP monitoring . Patient self care activities - Over the next 6 months, patient will: o Check BP once every twice weekly, document, and provide at future appointments o Continue daily walking  o Ensure daily salt intake < 2300 mg/day  Hyperlipidemia Lab Results  Component Value Date/Time   LDLCALC 24 10/06/2019 10:31 AM   LDLDIRECT 117.0 10/24/2018 11:15 AM   . Pharmacist Clinical Goal(s): o Over the next 6 months, patient will work with PharmD and providers to maintain LDL goal < 70 . Current regimen:   Praluent 75 mg/mL - Inject every 14 days  Ezetimibe 10 mg - 1 tablet daily  Fenofibrate 160mg  - 1 tablet daily  Icosapent Ethyl (Vascepa) 1 gm - 2 capsules BID . Interventions: o Recommend trial off fenofibrate  . Patient self care activities - Over the next 6 months, patient will: o Continue current medications until further contact from clinic  Please see past updates related to this goal by clicking on the "Past Updates" button in the selected goal        The patient verbalized understanding of instructions,  educational materials, and care plan provided today and declined offer to receive copy of patient instructions, educational materials, and care plan.   Telephone follow up appointment with pharmacy team member scheduled for: 02/15/21 at [place], [place] of Medicine Management Taking your medicines correctly is an important part of managing or preventing medical problems. Make sure you know what disease or condition your medicine is treating, and how and when to take it. If you do not take your medicine correctly, it may not work well and may cause unpleasant side effects, including serious health problems. What should I do when I am taking medicines?   Read all the labels and inserts that come with your medicines. Review the information often.  Talk with your pharmacist if you get a refill and notice a change in the size, color, or shape of your medicines.  Know the potential side effects for each medicine that you take.  Try to get all your medicines from the same pharmacy. The pharmacist will have all your information and will understand how your medicines will affect each other (interact).  Tell your health care provider about all your medicines, including over-the-counter medicines, vitamins, and herbal or dietary supplements. He or she will make sure that nothing will interact with any of your prescribed medicines. How can I take my medicines safely?  Take medicines only as told by your health care provider. ?  Do not take more of your medicine than instructed. ? Do not take anyone else's medicines. ? Do not share your medicines with others. ? Do not stop taking your medicines unless your health care provider tells you to do so. ? You may need to avoid alcohol or certain foods or liquids when taking certain medicines. Follow your health care provider's instructions.  Do not split, mash, or chew your medicines unless your health care provider tells you to do so.  Tell your health care provider if you have trouble swallowing your medicines.  For liquid medicine, use the dosing container that was provided. How should I organize my medicines?  Know your medicines  Know what each of your medicines looks like. This includes size, color, and shape. Tell your health care provider if you are having trouble recognizing all the medicines that you are taking.  If you cannot tell your medicines apart because they look similar, keep them in original bottles.  If you cannot read the labels on the bottles, tell your pharmacist to put your medicines in containers with large print.  Review your medicines and your schedule with family members, a friend, or a caregiver. Use a pill organizer  Use a tool to organize your medicine schedule. Tools include a weekly pillbox, a written chart, a notebook, or a calendar.  Your tool should help you remember the following things about each medicine: ? The name of the medicine. ? The amount (dose) to take. ? The schedule. This is the day and time the medicine should be taken. ? The appearance. This includes color, shape, size, and stamp. ? How to take your medicines. This includes instructions to take them with food, without food, with fluids, or with other medicines.  Create reminders for taking your medicines. Use sticky notes, or alarms on your watch, mobile device, or phone calendar.  You may choose to use a more advanced management system. These systems have storage, alarms, and visual and audio prompts.  Some medicines can be taken on an "as-needed" basis. These include medicines for nausea or pain. If you take an as-needed medicine, write down the name and dose, as well as the date and time that you took it. How should I plan for travel?  Take your pillbox, medicines, and organization system with you when traveling.  Have your medicines refilled before you travel. This will ensure that you do not run out of your  medicines while you are away from home.  Always carry an updated list of your medicines with you. If there is an emergency, a first responder can quickly see what medicines you are taking.  Do not pack your medicines in checked luggage in case your luggage is lost or delayed.  If any of your medicines is considered a controlled substance, make sure you bring a letter from your health care provider with you. How should I store and discard my medicines? For safe storage:  Store medicines in a cool, dry area away from light, or as directed by your health care provider. Do not store medicines in the bathroom. Heat and humidity will affect them.  Do not store your medicines with other chemicals, or with medicines for pets or other household members.  Keep medicines away from children and pets. Do not leave them on counters or bedside tables. Store them in high cabinets or on high shelves. For safe disposal:  Check expiration dates regularly. Do not take expired medicines. Discard medicines that are older than  the expiration date.  Learn a safe way to dispose of your medicines. You may: ? Use a local government, hospital, or pharmacy medicine-take-back program. ? Mix the medicines with inedible substances, put them in a sealed bag or empty container, and throw them in the trash. What should I remember?  Tell your health care provider if you: ? Experience side effects. ? Have new symptoms. ? Have other concerns about taking your medicines.  Review your medicines regularly with your health care provider. Other medicines, diet, medical conditions, weight changes, and daily habits can all affect how medicines work. Ask if you need to continue taking each medicine, and discuss how well each one is working.  Refill your medicines early to avoid running out of them.  In case of an accidental overdose, call your local [place] at [phone] or visit your local emergency department  immediately. This is important. Summary  Taking your medicines correctly is an important part of managing or preventing medical problems.  You need to make sure that you understand what you are taking a medicine for, as well as how and when you need to take it.  Know your medicines and use a pill organizer to help you take your medicines correctly.  In case of an accidental overdose, call your local [place] at [phone] or visit your local emergency department immediately. This is important. This information is not intended to replace advice given to you by your health care provider. Make sure you discuss any questions you have with your health care provider. Document Revised: 08/09/2017 Document Reviewed: 08/09/2017 Elsevier Patient Education  2020 [employer].

## 2020-08-16 NOTE — Chronic Care Management (AMB) (Signed)
Chronic Care Management Pharmacy  Name: Anthony [name]  MRN: [id number] DOB: 1935/09/28  Chief Complaint/ HPI  Anthony [name],  84 y.o., male presents for their Follow-Up CCM visit with the clinical pharmacist via telephone.  PCP : Ria Bush, MD  Their chronic conditions include: hypercholesterolemia, major depressive disorder, constipation, chronic pain, history of atrial flutter, coronary artery disease (stent placed 2013), hypertension, diastolic heart failure, hypothyroidism, sleep apnea, essential tremor, CKD, BPH, vitamin D deficiency, seasonal allergies  Patient concerns: denies medication concerns or questions 08/16/20  Last CCM visit 04/16/20 - Donette Larry, CPP   Office Visits:  06/30/20: Danise Mina - chief complaint right ankle pain, conservative management, diclofenac gel 1%, Icy Hot, Aspercreme, Tylenol.  Lower back pain has significantly improved since endovascular AAA repair. Continues gabapentin 600mg  at night time - recommend trial dropping to 300 mg qhs. Monitor effect on tremor. Fully off hydrocodone. HTN, some dizziness, consider dose change pending labs today.   04/02/2020 - complaint of leg pain and elevated home bp. Start amlodipine for tight bp control due to elevated AAA size. Referral for MRI of back due to leg pain.   2/10/20Danise Mina - right foot pain, likely neuropathic, continue hydrocodone sparingly, start gabapentin 100-200 mg qhs, consider B12 level next labs, continue sparing Lunesta for insomnia    07/04/19: Danise Mina - hydrocodone PRN sparingly, continue wellbutrin   06/06/19: Danise Mina - severe obesity, discontinue naltrexone, continue wellbutrin BID for next month  Consult Visit:  05/05/20: AAA repair  04/22/20: Cardiology - cleared for AAA repair, LDL 24 on PCSK9   04/06/20: Vascular Surgery - consider repair for 5 cm abdominal aortic aneurysm. Recommend 20-30 mmHg compression stockings daily. Hydration recommended prior to CT.   10/06/19:  Hypercholesterolemia - note not available  06/24/19: Hypercholesterolemia - start Praluent injection once every 14 days   Allergies  Allergen Reactions  . Questran [Cholestyramine]     Patient not aware of an allergy to this medicine.  . Atorvastatin     Muscle pain  . Gemfibrozil Other (See Comments)    Muscle pain.  . Metformin And Related     Dizziness  . Rosuvastatin     Muscle pain   Medications: Outpatient Encounter Medications as of 08/16/2020  Medication Sig  . acetaminophen (TYLENOL) 500 MG tablet Take 1 tablet (500 mg total) by mouth 2 (two) times daily as needed for moderate pain.  . Alirocumab (PRALUENT) 75 MG/ML SOAJ INJECT 1 PEN INTO THE SKIN EVERY 14 DAYS  . amLODipine (NORVASC) 5 MG tablet Take 1 tablet (5 mg total) by mouth daily.  Marland Kitchen aspirin EC 81 MG tablet Take 1 tablet (81 mg total) by mouth daily.  . Cholecalciferol (VITAMIN D3) 1000 units CAPS Take 1 capsule (1,000 Units total) by mouth daily.  . clopidogrel (PLAVIX) 75 MG tablet Take 1 tablet (75 mg total) by mouth daily at 6 (six) AM.  . diphenhydramine-acetaminophen (TYLENOL PM) 25-500 MG TABS tablet Take 1 tablet by mouth at bedtime.  . docusate sodium (COLACE) 100 MG capsule Take 100 mg by mouth daily as needed.  . ezetimibe (ZETIA) 10 MG tablet TAKE 1 TABLET(10 MG) BY MOUTH DAILY  . fenofibrate 160 MG tablet TAKE 1 TABLET BY MOUTH EVERY DAY  . finasteride (PROSCAR) 5 MG tablet TAKE 1 TABLET(5 MG) BY MOUTH DAILY  . fluocinonide (LIDEX) 0.05 % external solution Apply 1 application topically 2 (two) times daily as needed. (Patient taking differently: Apply 1 application topically as needed.)  . furosemide (  LASIX) 40 MG tablet TAKE 1 TABLET BY MOUTH EVERY DAY. TAKE AN EXTRA TABLET AS NEEDED FOR WEIGHT GAIN OF 3 LBS/DAY OR 5LBS/WEEK  . gabapentin (NEURONTIN) 300 MG capsule TAKE 2 CAPSULES BY MOUTH AT BEDTIME. WITH EXTRA CAPSULE DURING THE DAY AS NEEDED  . HYDROcodone-acetaminophen (NORCO) 10-325 MG tablet Take  1 tablet by mouth every 8 (eight) hours as needed.  Marland Kitchen icosapent Ethyl (VASCEPA) 1 g capsule TAKE 2 CAPSULES(2 GRAMS) BY MOUTH TWICE DAILY  . ketoconazole (NIZORAL) 2 % cream Apply 1 application topically daily as needed (for dermatitits).   Marland Kitchen ketoconazole (NIZORAL) 2 % shampoo Apply topically as directed. Apply 3 times weekly  . levothyroxine (SYNTHROID) 175 MCG tablet TAKE 1 TABLET(175 MCG) BY MOUTH DAILY BEFORE BREAKFAST  . LINZESS 290 MCG CAPS capsule TAKE 1 CAPSULE(290 MCG) BY MOUTH DAILY 30 MINUTES BEFORE FIRST MEAL OF THE DAY ON AN EMPTY STOMACH  . lisinopril (ZESTRIL) 10 MG tablet Take 1 tablet (10 mg total) by mouth daily.  . Melatonin 10 MG CAPS Take by mouth.  . Multiple Vitamins-Minerals (CENTRUM SILVER PO) Take 1 tablet by mouth daily.  . Multiple Vitamins-Minerals (PRESERVISION AREDS 2 PO) Take 1 tablet by mouth daily.  . nitroGLYCERIN (NITROSTAT) 0.4 MG SL tablet Place 1 tablet (0.4 mg total) under the tongue every 5 (five) minutes as needed for chest pain. (Patient taking differently: Place 0.4 mg under the tongue every 5 (five) minutes as needed for chest pain. Patient has not used in 10 years)  . tiZANidine (ZANAFLEX) 2 MG tablet Take 1-2 tablets (2-4 mg total) by mouth 2 (two) times daily as needed for muscle spasms.  . [DISCONTINUED] Azelastine HCl 0.15 % SOLN as needed.   . [DISCONTINUED] buPROPion (WELLBUTRIN SR) 100 MG 12 hr tablet Take 1 tablet (100 mg total) by mouth daily.  . [DISCONTINUED] loratadine (CLARITIN) 10 MG tablet Take 1 tablet (10 mg total) by mouth daily.  . [DISCONTINUED] tamsulosin (FLOMAX) 0.4 MG CAPS capsule TAKE 2 CAPSULES(0.8 MG) BY MOUTH DAILY (Patient not taking: Reported on 08/16/2020)   No facility-administered encounter medications on file as of 08/16/2020.   Current Diagnosis/Assessment: Goals    . Increase physical activity     Starting 10/24/2018, I will continue to exercise for at least 45 minutes daily.     . Patient Stated     10/30/2019, I  will continue to walk for 1 hour everyday.     Marland Kitchen Pharmacy Care Plan     CARE PLAN ENTRY (see longitudinal plan of care for additional care plan information)  Current Barriers:  . Chronic Disease Management support, education, and care coordination needs related to Hypertension and Hyperlipidemia   Hypertension BP Readings from Last 3 Encounters:  06/30/20 118/78  06/03/20 124/80  05/06/20 (!) 146/84 .  Pharmacist Clinical Goal(s): o Over the next 6 months, patient will work with PharmD and providers to achieve BP goal <130/80 . Current regimen:   Lisinopril 10 mg - 1 tablet daily  Amlodipine 5 mg - 1 tablet daily . Interventions: o Reviewed home BP monitoring . Patient self care activities - Over the next 6 months, patient will: o Check BP once every twice weekly, document, and provide at future appointments o Continue daily walking  o Ensure daily salt intake < 2300 mg/day  Hyperlipidemia Lab Results  Component Value Date/Time   LDLCALC 24 10/06/2019 10:31 AM   LDLDIRECT 117.0 10/24/2018 11:15 AM   . Pharmacist Clinical Goal(s): o Over the next 6  months, patient will work with PharmD and providers to maintain LDL goal < 70 . Current regimen:   Praluent 75 mg/mL - Inject every 14 days  Ezetimibe 10 mg - 1 tablet daily  Fenofibrate 160mg  - 1 tablet daily  Icosapent Ethyl (Vascepa) 1 gm - 2 capsules BID . Interventions: o Recommend trial off fenofibrate  . Patient self care activities - Over the next 6 months, patient will: o Continue current medications until further contact from clinic  Please see past updates related to this goal by clicking on the "Past Updates" button in the selected goal        Hyperlipidemia/CAD  Followed by cardiology  Lipid Panel     Component Value Date/Time   CHOL 102 10/06/2019 1031   TRIG 184 (H) 10/06/2019 1031   HDL 41 10/06/2019 1031   CHOLHDL 2.5 10/06/2019 1031   VLDL 37 10/06/2019 1031   LDLCALC 24 10/06/2019 1031    LDLDIRECT 117.0 10/24/2018 1115    CAD: LDL goal < 70 Patient has failed these meds in past: intolerance to statins, cholestyramine, and gemfibrozil (myalgias) Patient is currently controlled on the following medications:   Praluent 75 mg/mL - Inject every 14 days  Ezetimibe 10 mg - 1 tablet daily  Fenofibrate 160mg  - 1 tablet daily  Icosapent Ethyl (Vascepa) 1 gm - 2 capsules BID  Aspirin 81 mg - 1 tablet daily  Plavix 75 mg - 1 tablet daily  Nitroglycerin 0.4 mg SL - PRN  Update 08/16/20: All refills timely, LDL at goal on current therapy. Consider D/C of fenofibrate due to lack of CV benefit and no documented history of TG > 500 or pancreatitis.  Plan: Continue current medications; Consult PCP to trial off fenofibrate.   Heart Failure   Type: Diastolic Last ejection fraction: 01/2017, normal LVEF  Patient has failed these meds in past: potassium chloride - unknown Patient is currently controlled on the following medications:   Lisinopril 10 mg - 1 tablet daily  Furosemide 40 mg - 1 tablet daily and as needed for weight gain 3 lbs/24 hours   Update 08/16/20: Lisinopril dose decreased per PCP from 20 to 10 mg 07/03/20 due to renal function. Refills timely. Denies any SOB or swelling. Reviewed indication for extra Lasix.   Plan: Continue current medications  Hypertension   Office blood pressures are  BP Readings from Last 3 Encounters:  06/30/20 118/78  06/03/20 124/80  05/06/20 (!) 146/84   CMP Latest Ref Rng & Units 07/21/2020 06/30/2020 05/06/2020  Glucose 70 - 99 mg/dL 126(H) 130(H) 129(H)  BUN 6 - 23 mg/dL 29(H) 29(H) 31(H)  Creatinine 0.40 - 1.50 mg/dL 2.06(H) 2.00(H) 1.61(H)  Sodium 135 - 145 mEq/L 136 138 138  Potassium 3.5 - 5.1 mEq/L 4.0 4.3 4.2  Chloride 96 - 112 mEq/L 100 102 105  CO2 19 - 32 mEq/L 30 30 25   Calcium 8.4 - 10.5 mg/dL 10.5 10.2 9.2  Total Protein 6.1 - 8.1 g/dL 7.5 - -  Total Bilirubin 0.3 - 1.2 mg/dL - - -  Alkaline Phos 38 - 126 U/L  - - -  AST 15 - 41 U/L - - -  ALT 0 - 44 U/L - - -   CPAP: wears nightly Patient has failed these meds in the past: none  Patient is currently controlled on the following medications:   Lisinopril 10 mg - 1 tablet daily  Amlodipine 5 mg - 1 tablet daily  Update 08/16/20: Amlodipine and lisinopril refills  timely, renal function declined - lisinopril dose reduced 07/03/20. Continue to monitor. Checked BP yesterday, reports BP130/80 mmHg, son checks every 2 weeks when he stops by to fill pillbox. Pt would like to lose 100 lbs. Reports he is at 275-280 lbs currently.   Plan: Continue current medications   Allergic Rhinitis   Patient has failed these meds in past: none Patient is currently controlled on the following medications:   Azelastine nasal spray - 1-2 sprays in each nostril BID PRN  Loratadine 10 mg - 1 tablet daily  (PRN)  Update 08/16/20 - really doesn't use these at all, very rare allergies seasonally in Spring  Plan: Continue current medications; Removed above meds from med list.  BPH   Patient has failed these meds in past: none Patient is currently controlled on the following medications:   Tamsulosin 0.4 mg - 2 capsule daily  Finasteride 5 mg - 1 tablet daily  Update 08/16/20: Tamsulosin past due for refill. Pt is not taking currently because it is making him dizzy. Tried cutting back to every other day and still dizzy. No urinary symptoms without it.  Plan: Continue current medications; Remove tamsulosin from med list - pt not taking due to dizziness.  Neuropathy/Chronic pain   Symptoms: nerve pain down leg to foot, dull and aching back pain Patient has failed these meds in past: tramadol - not effective  Patient is currently uncontrolled on the following medications:   Gabapentin 200 mg - take 2 capsules at bedtime, 1 extra capsule during day PRN  Hydrocodone/acetaminophen 10-325 mg - 1 tablet BID PRN  Tizanidine 4 mg - 1 tablet BID PRN (rarely  uses)  Bowel regimen:   Linzess 290 mcg - 1 capsule daily 30 minutes before first meal of the day (does not put in his pillbox because he takes it at different time than other meds) Align - probiotic   Docusate 100 mg - 1 tablet BID PRN   Update 08/16/20:Continues gabapentin 2 capsules at night, helps with sleep, using hydrocodone sparingly, tizanidine sparingly. Continues Linzess daily and docusate PRN.   Plan: Continue current medications   Diet/Weight Loss   Patient has failed these meds in past: naltrexone/bupropion (ineffective) Patient is currently on the following medications:   No pharmacotherapy  Bupropion 100 mg - 1 tablet daily  Update 08/16/20: Last filled bupropion 05/25/20 30 DS, there was note in chart on 07/01/20 that pt was unclear if he should continue taking. New rx was sent to his pharmacy for once daily bupropion 100 mg. Exercise --> walks at the mall every day for 1 hour Diet --> eats out most meals, minimal home cooking - lives alone Current weight: 275-280 lbs Goal weight: 200 lbs Not taking bupropion - remove from med list  Plan: Recommend heart healthy diet for weight loss; Continue exercise. Remove bupropion from med list.   Insomnia    Patient has failed these meds in past: Lunesta - groggyness, trazodone - ineffective  Patient is currently controlled on the following medications:   Tylenol PM extra strength 25-500 mg - 1-2 tablets daily at bedtime PRN sleep  Melatonin 10 mg - 1 tablet qHs  Update 08/16/20: Continues both of the above medications. Discussed trying to limit Tylenol. Currently using Tylenol PM every night.  Plan: Continue current medications   Hypothyroidism   Lab Results  Component Value Date/Time   TSH 3.50 07/21/2020 01:40 PM   TSH 2.88 10/30/2019 10:46 AM   FREET4 0.87 12/17/2018 11:01 AM  Patient has failed these meds in past: none Patient is currently controlled on the following medications:   Levothyroxine 175  mcg - 1 tablet daily before breakfast  No update/changes 08/16/20  Plan: Continue current medications  Misc/otcs:   Vit D3 (07/21/20): 27 Patient has failed these meds in past: none Patient is currently uncontrolled on the following medications:   Vitamin D3 1000 IU - 2 capsule daily   Misc/OTCs:  Centrum Silver - 1 daily  Preservision - 1 BID  Ketoconazole 2% shampoo - apply 3 times weekly (uses almost daily)  Topical, fluocinonide topical solution - applies after shampoo daily  Update 08/16/20: Pt has increased vit D to 2000IU due to low vitamin D 07/21/20  Plan: Continue current medications  CKD   Lab Results  Component Value Date   CREATININE 2.06 (H) 07/21/2020   BUN 29 (H) 07/21/2020   GFR 29.39 (L) 07/21/2020   GFRNONAA 39 (L) 05/06/2020   GFRAA 45 (L) 05/06/2020   NA 136 07/21/2020   K 4.0 07/21/2020   CALCIUM 10.5 07/21/2020   CO2 30 07/21/2020   Update 08/16/20: Pt reports taking four Aleve with leg pain occasionally. Last dose - 4 tabs 3-4 days ago for aching in legs. Denies any efficacy with Tylenol. Drinking about 1-2 water 16 ounce bottles a day. Encouraged avoiding NSAIDs and never exceeding 1 aleve per day.  Plan: Stay hydrated, avoid NSAIDs, monitor medications for recommend dose adjustments.   Medication Management:  Pharmacy: [facility], [facility]: [employer]  --> son organizes patient's medications - fills pillbox every 2 weeks, insurance prefers [employer], cost would be higher with [employer]  Affordability: Denies cost concerns - Praluent through manufacturer assistance    Vaccines: Recommend Shingrix  CCM Follow Up: 6 months, telephone   Debbora Dus, PharmD Clinical Pharmacist [facility] [phone]

## 2020-08-16 NOTE — Progress Notes (Signed)
I have collaborated with the care management provider regarding care management and care coordination activities outlined in this encounter and have reviewed this encounter including documentation in the note and care plan. I am certifying that I agree with the content of this note and encounter as supervising physician.  

## 2020-08-22 NOTE — Telephone Encounter (Signed)
Noted. Agree may try off fenofibrate.  Also agree with limiting aleve to no more than 1/day given worsening kidney function. Can we call and schedule rpt labs in 1-2 months to monitor kidney function? Labs ordered.

## 2020-08-23 ENCOUNTER — Other Ambulatory Visit: Payer: Self-pay | Admitting: Family Medicine

## 2020-08-23 NOTE — Telephone Encounter (Signed)
Spoke with pt relaying Dr. Timoteo Expose message.  Pt verbalizes understanding and scheduled lab visit on 09/28/20 at 2:00.

## 2020-08-26 ENCOUNTER — Other Ambulatory Visit: Payer: Self-pay | Admitting: Internal Medicine

## 2020-09-20 ENCOUNTER — Other Ambulatory Visit: Payer: Self-pay | Admitting: Family Medicine

## 2020-09-27 ENCOUNTER — Ambulatory Visit: Payer: Medicare Other | Admitting: Dermatology

## 2020-09-27 ENCOUNTER — Telehealth: Payer: Self-pay

## 2020-09-27 ENCOUNTER — Other Ambulatory Visit: Payer: Self-pay | Admitting: Family Medicine

## 2020-09-27 MED ORDER — CHERATUSSIN AC 100-10 MG/5ML PO SOLN: 5.0000 mL | Freq: Two times a day (BID) | ORAL | 0 refills | Status: DC | PRN

## 2020-09-27 NOTE — Telephone Encounter (Signed)
Patient ask me if he could sit in the office until the Rx was ready or until the provider was done printing to put in his hands. I let patient know that provider would not give the patient a paper copy of Rx and would send it to pharmacy if approved.Marland Kitchen also let patient know that with him stating he had a cough he wasn't able to sit in the office and wait until could provide him with the Rx information he states "I do no have a cough right now, but its coming and that's the reason I came into the office to get the Rx in hand because I know he won't send it in today". I let patient know I would send a message to provider and nurse and someone would get back to him asap but they were in patient care at the moment

## 2020-09-27 NOTE — Telephone Encounter (Signed)
Patient came into the office stating that he knows he has a cough coming a long and is wanting the provider to send in a Rx for cough syrup with codeine. He states it has to be sent in today    Please call and advise   [facility] [place] [place] [facility] [address]

## 2020-09-27 NOTE — Telephone Encounter (Signed)
ERx 

## 2020-09-27 NOTE — Telephone Encounter (Signed)
Spoke with pt to get details about the cough.  Pt states he does not have a cough currently but feels one coming on.  Says Dr. Darnell Level has prescribed codeine cough syrup in the past that has helped.  He just wants to stay ahead of the cough.  I informed him he may need a visit.  Pt states Dr. Darnell Level has prescribed med in the past without an appt wants to see if he will do it again.  Plz advise.

## 2020-09-27 NOTE — Addendum Note (Signed)
Addended by: Ria Bush on: 09/27/2020 05:18 PM   Modules accepted: Orders

## 2020-09-28 ENCOUNTER — Other Ambulatory Visit (INDEPENDENT_AMBULATORY_CARE_PROVIDER_SITE_OTHER): Payer: Medicare Other

## 2020-09-28 ENCOUNTER — Other Ambulatory Visit: Payer: Self-pay

## 2020-09-28 DIAGNOSIS — N1832 Chronic kidney disease, stage 3b: Secondary | ICD-10-CM

## 2020-09-28 LAB — RENAL FUNCTION PANEL
Albumin: 4.1 g/dL (ref 3.5–5.2)
BUN: 36 mg/dL — ABNORMAL HIGH (ref 6–23)
CO2: 27 mEq/L (ref 19–32)
Calcium: 10.1 mg/dL (ref 8.4–10.5)
Chloride: 103 mEq/L (ref 96–112)
Creatinine, Ser: 1.7 mg/dL — ABNORMAL HIGH (ref 0.40–1.50)
GFR: 36.96 mL/min — ABNORMAL LOW (ref >60.00)
Glucose, Bld: 105 mg/dL — ABNORMAL HIGH (ref 70–99)
Phosphorus: 2.9 mg/dL (ref 2.3–4.6)
Potassium: 4.1 mEq/L (ref 3.5–5.1)
Sodium: 136 mEq/L (ref 135–145)

## 2020-09-28 NOTE — Telephone Encounter (Signed)
Pharmacy requests refill on: Finasteride 5 mg   LAST REFILL: 07/25/2020 (Q-90, R-0) LAST OV: 06/30/2020 NEXT OV: Not Scheduled  PHARMACY: [facility] [place], [place]

## 2020-10-06 ENCOUNTER — Ambulatory Visit (INDEPENDENT_AMBULATORY_CARE_PROVIDER_SITE_OTHER): Payer: Medicare Other | Admitting: Family Medicine

## 2020-10-06 ENCOUNTER — Encounter: Payer: Self-pay | Admitting: Family Medicine

## 2020-10-06 ENCOUNTER — Other Ambulatory Visit: Payer: Self-pay

## 2020-10-06 VITALS — BP 132/82 | HR 87 | Temp 97.8°F | Ht 72.0 in | Wt 283.3 lb

## 2020-10-06 DIAGNOSIS — N1832 Chronic kidney disease, stage 3b: Secondary | ICD-10-CM | POA: Diagnosis not present

## 2020-10-06 DIAGNOSIS — Z9989 Dependence on other enabling machines and devices: Secondary | ICD-10-CM | POA: Diagnosis not present

## 2020-10-06 DIAGNOSIS — G4733 Obstructive sleep apnea (adult) (pediatric): Secondary | ICD-10-CM

## 2020-10-06 DIAGNOSIS — F5104 Psychophysiologic insomnia: Secondary | ICD-10-CM

## 2020-10-06 DIAGNOSIS — M25571 Pain in right ankle and joints of right foot: Secondary | ICD-10-CM | POA: Diagnosis not present

## 2020-10-06 DIAGNOSIS — K219 Gastro-esophageal reflux disease without esophagitis: Secondary | ICD-10-CM

## 2020-10-06 DIAGNOSIS — E782 Mixed hyperlipidemia: Secondary | ICD-10-CM

## 2020-10-06 DIAGNOSIS — G8929 Other chronic pain: Secondary | ICD-10-CM

## 2020-10-06 DIAGNOSIS — K5909 Other constipation: Secondary | ICD-10-CM

## 2020-10-06 MED ORDER — TRAZODONE HCL 50 MG PO TABS: 25.0000 mg | ORAL_TABLET | Freq: Every evening | ORAL | 3 refills | Status: DC | PRN

## 2020-10-06 NOTE — Patient Instructions (Addendum)
Congrats on weight loss! Happy early birthday.  I'd like to see increase in fruit/vegetable servings daily.  Try trazodone 25-50mg  at night time for sleep. If working well, this may replace tylenol PM. Otherwise ok to continue the gabapentin 600mg  and tylenol PM 1-2 at night.   Sleep hygiene checklist: 1. Avoid naps during the day 2. Avoid stimulants such as caffeine and nicotine. Avoid bedtime alcohol (it can speed onset of sleep but the body's metabolism can cause awakenings). 3. All forms of exercise help ensure sound sleep - limit vigorous exercise to morning or late afternoon 4. Avoid food too close to bedtime including chocolate (which contains caffeine) 5. Soak up natural light 6. Establish regular bedtime routine. 7. Associate bed with sleep - avoid TV, computer or phone, reading while in bed. 8. Ensure pleasant, relaxing sleep environment - quiet, dark, cool room.   Possible peroneal tendonitis on right - try voltaren gel 2-3 times daily for 1 week as well as do exercises provided today. Let us know if not improved with this.   Insomnia Insomnia is a sleep disorder that makes it difficult to fall asleep or stay asleep. Insomnia can cause fatigue, low energy, difficulty concentrating, mood swings, and poor performance at work or school. There are three different ways to classify insomnia:  Difficulty falling asleep.  Difficulty staying asleep.  Waking up too early in the morning. Any type of insomnia can be long-term (chronic) or short-term (acute). Both are common. Short-term insomnia usually lasts for three months or less. Chronic insomnia occurs at least three times a week for longer than three months. What are the causes? Insomnia may be caused by another condition, situation, or substance, such as:  Anxiety.  Certain medicines.  Gastroesophageal reflux disease (GERD) or other gastrointestinal conditions.  Asthma or other breathing conditions.  Restless legs syndrome,  sleep apnea, or other sleep disorders.  Chronic pain.  Menopause.  Stroke.  Abuse of alcohol, tobacco, or illegal drugs.  Mental health conditions, such as depression.  Caffeine.  Neurological disorders, such as Alzheimer's disease.  An overactive thyroid (hyperthyroidism). Sometimes, the cause of insomnia may not be known. What increases the risk? Risk factors for insomnia include:  Gender. Women are affected more often than men.  Age. Insomnia is more common as you get older.  Stress.  Lack of exercise.  Irregular work schedule or working night shifts.  Traveling between different time zones.  Certain medical and mental health conditions. What are the signs or symptoms? If you have insomnia, the main symptom is having trouble falling asleep or having trouble staying asleep. This may lead to other symptoms, such as:  Feeling fatigued or having low energy.  Feeling nervous about going to sleep.  Not feeling rested in the morning.  Having trouble concentrating.  Feeling irritable, anxious, or depressed. How is this diagnosed? This condition may be diagnosed based on:  Your symptoms and medical history. Your health care provider may ask about: ? Your sleep habits. ? Any medical conditions you have. ? Your mental health.  A physical exam. How is this treated? Treatment for insomnia depends on the cause. Treatment may focus on treating an underlying condition that is causing insomnia. Treatment may also include:  Medicines to help you sleep.  Counseling or therapy.  Lifestyle adjustments to help you sleep better. Follow these instructions at home: Eating and drinking  Limit or avoid alcohol, caffeinated beverages, and cigarettes, especially close to bedtime. These can disrupt your sleep.  Do  not eat a large meal or eat spicy foods right before bedtime. This can lead to digestive discomfort that can make it hard for you to sleep.   Sleep habits  Keep  a sleep diary to help you and your health care provider figure out what could be causing your insomnia. Write down: ? When you sleep. ? When you wake up during the night. ? How well you sleep. ? How rested you feel the next day. ? Any side effects of medicines you are taking. ? What you eat and drink.  Make your bedroom a dark, comfortable place where it is easy to fall asleep. ? Put up shades or blackout curtains to block light from outside. ? Use a white noise machine to block noise. ? Keep the temperature cool.  Limit screen use before bedtime. This includes: ? Watching TV. ? Using your smartphone, tablet, or computer.  Stick to a routine that includes going to bed and waking up at the same times every day and night. This can help you fall asleep faster. Consider making a quiet activity, such as reading, part of your nighttime routine.  Try to avoid taking naps during the day so that you sleep better at night.  Get out of bed if you are still awake after 15 minutes of trying to sleep. Keep the lights down, but try reading or doing a quiet activity. When you feel sleepy, go back to bed.   General instructions  Take over-the-counter and prescription medicines only as told by your health care provider.  Exercise regularly, as told by your health care provider. Avoid exercise starting several hours before bedtime.  Use relaxation techniques to manage stress. Ask your health care provider to suggest some techniques that may work well for you. These may include: ? Breathing exercises. ? Routines to release muscle tension. ? Visualizing peaceful scenes.  Make sure that you drive carefully. Avoid driving if you feel very sleepy.  Keep all follow-up visits as told by your health care provider. This is important. Contact a health care provider if:  You are tired throughout the day.  You have trouble in your daily routine due to sleepiness.  You continue to have sleep problems, or  your sleep problems get worse. Get help right away if:  You have serious thoughts about hurting yourself or someone else. If you ever feel like you may hurt yourself or others, or have thoughts about taking your own life, get help right away. You can go to your nearest emergency department or call:  Your local emergency services (911 in the [place]  A suicide crisis helpline, such as the [place] at 223-570-5153. This is open 24 hours a day. Summary  Insomnia is a sleep disorder that makes it difficult to fall asleep or stay asleep.  Insomnia can be long-term (chronic) or short-term (acute).  Treatment for insomnia depends on the cause. Treatment may focus on treating an underlying condition that is causing insomnia.  Keep a sleep diary to help you and your health care provider figure out what could be causing your insomnia. This information is not intended to replace advice given to you by your health care provider. Make sure you discuss any questions you have with your health care provider. Document Revised: 06/24/2020 Document Reviewed: 06/24/2020 Elsevier Patient Education  2021 [employer].

## 2020-10-06 NOTE — Progress Notes (Signed)
Patient ID: Anthony [name], male    DOB: [birth year], 85 y.o.   MRN: [id number]  This visit was conducted in person.  BP 132/82   Pulse 87   Temp 97.8 F (36.6 C) (Temporal)   Ht 6' (1.829 m)   Wt 283 lb 5 oz (128.5 kg)   SpO2 95%   BMI 38.42 kg/m    CC: insomnia  Subjective:   HPI: Anthony [name] is a 85 y.o. male presenting on 10/06/2020 for Insomnia (C/o trouble falling/staying asleep.  Started yrs ago. )   Progressive worsening kidney function noted over the past 6 months. Some improvement after dropping lisinopril dose to 10mg  daily. Feels he's staying well hydrated. He hasn't been taking lasix.   12-15 lb weight loss in the past 3 months - he's been working hard on this. Has changed diet - significantly dropped portion sizes, increasing water. Feels this is sustainable. Poor fruit/vegetable intake. Bowels moving regularly with linzess.   Chronic insomnia for years - predominant trouble falling asleep. No daytime naps. Sleep hygiene measures reviewed. No alcohol. Has tried unisom 50mg , sleep aide (with melatonin and L theanine). Also tried lunesta and trazodone which wer ineffective. Current regimen is 2 tylenol PM (500/25mg ) and 2 gabapentin 300mg  at night. This works pretty well.   OSA on CPAP regularly.   PRN cough syrup but using for hoarse voice.  Continues mucinex BID. Notes mucous production.   HLD - recently fenofibrate was stopped.   R ankle pain ongoing for 1-2 months. Denies inciting trauma/injury or fall. Points to lateral ankle. Uses ankle sleeve wrap with benefit.   L knee instability without pain or recent fall - declines ortho eval.      Relevant past medical, surgical, family and social history reviewed and updated as indicated. Interim medical history since our last visit reviewed. Allergies and medications reviewed and updated. Outpatient Medications Prior to Visit  Medication Sig Dispense Refill  . acetaminophen (TYLENOL) 500 MG tablet Take 1 tablet  (500 mg total) by mouth 2 (two) times daily as needed for moderate pain.    . Alirocumab (PRALUENT) 75 MG/ML SOAJ INJECT 1 PEN INTO THE SKIN EVERY 14 DAYS 2 mL 9  . amLODipine (NORVASC) 5 MG tablet Take 1 tablet (5 mg total) by mouth daily. 30 tablet 6  . aspirin EC 81 MG tablet Take 1 tablet (81 mg total) by mouth daily.    . Cholecalciferol (VITAMIN D3) 1000 units CAPS Take 1 capsule (1,000 Units total) by mouth daily. 30 capsule   . clopidogrel (PLAVIX) 75 MG tablet Take 1 tablet (75 mg total) by mouth daily at 6 (six) AM. 90 tablet 3  . diphenhydramine-acetaminophen (TYLENOL PM) 25-500 MG TABS tablet Take 1 tablet by mouth at bedtime.    . docusate sodium (COLACE) 100 MG capsule Take 100 mg by mouth daily as needed.    . ezetimibe (ZETIA) 10 MG tablet TAKE 1 TABLET(10 MG) BY MOUTH DAILY 90 tablet 3  . fenofibrate 160 MG tablet TAKE 1 TABLET BY MOUTH EVERY DAY 90 tablet 3  . finasteride (PROSCAR) 5 MG tablet TAKE 1 TABLET(5 MG) BY MOUTH DAILY 90 tablet 0  . fluocinonide (LIDEX) 0.05 % external solution Apply 1 application topically 2 (two) times daily as needed. (Patient taking differently: Apply 1 application topically as needed.) 60 mL 2  . furosemide (LASIX) 40 MG tablet TAKE 1 TABLET BY MOUTH EVERY DAY. TAKE AN EXTRA TABLET AS NEEDED FOR WEIGHT GAIN  OF 3 LBS/DAY OR 5LBS/WEEK 135 tablet 3  . gabapentin (NEURONTIN) 300 MG capsule TAKE 2 CAPSULES BY MOUTH AT BEDTIME. WITH EXTRA CAPSULE DURING THE DAY AS NEEDED 200 capsule 1  . guaiFENesin-codeine (CHERATUSSIN AC) 100-10 MG/5ML syrup Take 5 mLs by mouth 2 (two) times daily as needed for cough (do not mix with hydrocodone). 120 mL 0  . HYDROcodone-acetaminophen (NORCO) 10-325 MG tablet Take 1 tablet by mouth every 8 (eight) hours as needed. 20 tablet 0  . icosapent Ethyl (VASCEPA) 1 g capsule Take 2 capsules (2 g total) by mouth 2 (two) times daily. 120 capsule 11  . ketoconazole (NIZORAL) 2 % cream Apply 1 application topically daily as needed  (for dermatitits).     Marland Kitchen ketoconazole (NIZORAL) 2 % shampoo Apply topically as directed. Apply 3 times weekly 120 mL 2  . levothyroxine (SYNTHROID) 175 MCG tablet TAKE 1 TABLET(175 MCG) BY MOUTH DAILY BEFORE BREAKFAST 90 tablet 3  . LINZESS 290 MCG CAPS capsule TAKE 1 CAPSULE(290 MCG) BY MOUTH DAILY 30 MINUTES BEFORE FIRST MEAL OF THE DAY ON AN EMPTY STOMACH 90 capsule 1  . lisinopril (ZESTRIL) 10 MG tablet Take 1 tablet (10 mg total) by mouth daily. 90 tablet 1  . Melatonin 10 MG CAPS Take by mouth.    . Multiple Vitamins-Minerals (CENTRUM SILVER PO) Take 1 tablet by mouth daily.    . Multiple Vitamins-Minerals (PRESERVISION AREDS 2 PO) Take 1 tablet by mouth daily.    . nitroGLYCERIN (NITROSTAT) 0.4 MG SL tablet Place 1 tablet (0.4 mg total) under the tongue every 5 (five) minutes as needed for chest pain. (Patient taking differently: Place 0.4 mg under the tongue every 5 (five) minutes as needed for chest pain. Patient has not used in 10 years) 25 tablet 0  . tiZANidine (ZANAFLEX) 2 MG tablet Take 1-2 tablets (2-4 mg total) by mouth 2 (two) times daily as needed for muscle spasms. 30 tablet 0   No facility-administered medications prior to visit.     Per HPI unless specifically indicated in ROS section below Review of Systems Objective:  BP 132/82   Pulse 87   Temp 97.8 F (36.6 C) (Temporal)   Ht 6' (1.829 m)   Wt 283 lb 5 oz (128.5 kg)   SpO2 95%   BMI 38.42 kg/m   Wt Readings from Last 3 Encounters:  10/06/20 283 lb 5 oz (128.5 kg)  06/30/20 295 lb 9 oz (134.1 kg)  06/03/20 297 lb (134.7 kg)      Physical Exam Vitals and nursing note reviewed.  Constitutional:      Appearance: Normal appearance. He is obese. He is not ill-appearing.  Musculoskeletal:        General: Swelling (R lateral ankle) and tenderness present.     Right lower leg: Edema (tr) present.     Left lower leg: Edema (tr) present.     Comments:  No ligament laxity No pain at base of 5th MT FROM at  ankles bilaterally 1+ DP bilaterally FROM at L knee without pain redness or swelling or warmth  Skin:    General: Skin is warm and dry.     Findings: No rash.  Neurological:     Mental Status: He is alert.  Psychiatric:        Mood and Affect: Mood normal.        Behavior: Behavior normal.       Results for orders placed or performed in visit on 09/28/20  Renal  function panel  Result Value Ref Range   Sodium 136 135 - 145 mEq/L   Potassium 4.1 3.5 - 5.1 mEq/L   Chloride 103 96 - 112 mEq/L   CO2 27 19 - 32 mEq/L   Albumin 4.1 3.5 - 5.2 g/dL   BUN 36 (H) 6 - 23 mg/dL   Creatinine, Ser 1.70 (H) 0.40 - 1.50 mg/dL   Glucose, Bld 105 (H) 70 - 99 mg/dL   Phosphorus 2.9 2.3 - 4.6 mg/dL   GFR 36.96 (L) >60.00 mL/min   Calcium 10.1 8.4 - 10.5 mg/dL   Assessment & Plan:  This visit occurred during the SARS-CoV-2 public health emergency.  Safety protocols were in place, including screening questions prior to the visit, additional usage of staff PPE, and extensive cleaning of exam room while observing appropriate contact time as indicated for disinfecting solutions.   Problem List Items Addressed This Visit    Severe obesity (BMI 35.0-39.9) with comorbidity ([place])    Congratulated on weight loss to date over the past 3 months.  Reviewed diet, encouraged increased fruit/vegetable intake.  He feels smaller portions are sustainable - will continue this.       Pain in lateral portion of right ankle    Overall benign exam. rec continue ankle wrap, consider lace up brace.       OSA on CPAP    Continues CPAP nightly.       Hypercholesterolemia with hypertriglyceridemia    Statin intolerance. Now on praluent long with zetia and vascepa.  Fenofibrate stopped recently. Will need FLP checked at next labs.       Esophageal reflux    Describes ongoing hoarse voice associated with irritation in back of throat - has been treating with cough syrup with poor effect. Discussed this could be  effect of reflux - suggested try pepcid at night time.       Encounter for chronic pain management    [place] CSRS reviewed Sparing hydrocodone 10/325mg  use       CKD (chronic kidney disease) stage 3, GFR 30-59 ml/min (HCC)    Latest GFR 37. Encouraged continue good hydration status.       Chronic insomnia - Primary    Predominant sleep initiation insomnia. Sleep hygiene reviewed, handout provided.  Currently on gabapentin 600mg  nightly as well as tylenol PM 2 tab nightly (total 50mg  diphenhydramine).  Will trial trazodone 25-50mg  nightly ,update with effect.       Chronic constipation    Stable period on linzess and colace.  Encouraged increase in fiber in diet.           Meds ordered this encounter  Medications  . traZODone (DESYREL) 50 MG tablet    Sig: Take 0.5-1 tablets (25-50 mg total) by mouth at bedtime as needed for sleep.    Dispense:  30 tablet    Refill:  3   No orders of the defined types were placed in this encounter.   Patient instructions: Congrats on weight loss! Happy early birthday.  I'd like to see increase in fruit/vegetable servings daily.  Try trazodone 25-50mg  at night time for sleep. If working well, this may replace tylenol PM. Otherwise ok to continue the gabapentin 600mg  and tylenol PM 1-2 at night.   Sleep hygiene checklist: 1. Avoid naps during the day 2. Avoid stimulants such as caffeine and nicotine. Avoid bedtime alcohol (it can speed onset of sleep but the body's metabolism can cause awakenings). 3. All forms of exercise help ensure  sound sleep - limit vigorous exercise to morning or late afternoon 4. Avoid food too close to bedtime including chocolate (which contains caffeine) 5. Soak up natural light 6. Establish regular bedtime routine. 7. Associate bed with sleep - avoid TV, computer or phone, reading while in bed. 8. Ensure pleasant, relaxing sleep environment - quiet, dark, cool room.    Follow up plan: Return if symptoms worsen  or fail to improve.  Ria Bush, MD

## 2020-10-09 ENCOUNTER — Encounter: Payer: Self-pay | Admitting: Family Medicine

## 2020-10-09 NOTE — Assessment & Plan Note (Signed)
Predominant sleep initiation insomnia. Sleep hygiene reviewed, handout provided.  Currently on gabapentin 600mg  nightly as well as tylenol PM 2 tab nightly (total 50mg  diphenhydramine).  Will trial trazodone 25-50mg  nightly ,update with effect.

## 2020-10-09 NOTE — Assessment & Plan Note (Signed)
Congratulated on weight loss to date over the past 3 months.  Reviewed diet, encouraged increased fruit/vegetable intake.  He feels smaller portions are sustainable - will continue this.

## 2020-10-09 NOTE — Assessment & Plan Note (Signed)
[place] CSRS reviewed Sparing hydrocodone 10/325mg  use

## 2020-10-09 NOTE — Assessment & Plan Note (Signed)
Stable period on linzess and colace.  Encouraged increase in fiber in diet.

## 2020-10-09 NOTE — Assessment & Plan Note (Addendum)
Statin intolerance. Now on praluent long with zetia and vascepa.  Fenofibrate stopped recently. Will need FLP checked at next labs.

## 2020-10-09 NOTE — Assessment & Plan Note (Addendum)
Overall benign exam. rec continue ankle wrap, consider lace up brace.

## 2020-10-09 NOTE — Assessment & Plan Note (Signed)
Latest GFR 37. Encouraged continue good hydration status.

## 2020-10-09 NOTE — Assessment & Plan Note (Signed)
Describes ongoing hoarse voice associated with irritation in back of throat - has been treating with cough syrup with poor effect. Discussed this could be effect of reflux - suggested try pepcid at night time.

## 2020-10-09 NOTE — Assessment & Plan Note (Signed)
Continues CPAP nightly.

## 2020-10-11 ENCOUNTER — Other Ambulatory Visit: Payer: Self-pay | Admitting: Family Medicine

## 2020-10-11 NOTE — Telephone Encounter (Signed)
Pharmacy requests refill on: Finasteride 5 mg   LAST REFILL: 07/25/2020 (Q-90, R-0) LAST OV: 10/06/2020 NEXT OV: Not Scheduled  PHARMACY: [facility] [place], [place]   Earliest [place] Date: 10/25/2020

## 2020-10-14 ENCOUNTER — Telehealth: Payer: Self-pay | Admitting: Family Medicine

## 2020-10-14 ENCOUNTER — Other Ambulatory Visit: Payer: Self-pay | Admitting: Family Medicine

## 2020-10-14 NOTE — Telephone Encounter (Signed)
E-scribed refill.  Plz schedule wellness, lab and cpe visits.  

## 2020-10-15 NOTE — Telephone Encounter (Signed)
Pharmacy requests refill on: Finasteride 5 mg   LAST REFILL: 07/25/2020 (Q-90, R-0) LAST OV: 10/06/2020 NEXT OV: Not Scheduled  PHARMACY: [facility] [place], [place]   Earliest [place] Date: 10/25/2020

## 2020-10-23 ENCOUNTER — Other Ambulatory Visit: Payer: Self-pay | Admitting: Family Medicine

## 2020-10-26 ENCOUNTER — Other Ambulatory Visit: Payer: Medicare Other

## 2020-11-03 ENCOUNTER — Encounter: Payer: Self-pay | Admitting: Dermatology

## 2020-11-03 ENCOUNTER — Ambulatory Visit (INDEPENDENT_AMBULATORY_CARE_PROVIDER_SITE_OTHER): Payer: Medicare Other | Admitting: Dermatology

## 2020-11-03 ENCOUNTER — Other Ambulatory Visit: Payer: Self-pay

## 2020-11-03 ENCOUNTER — Encounter: Payer: Self-pay | Admitting: Family Medicine

## 2020-11-03 DIAGNOSIS — D692 Other nonthrombocytopenic purpura: Secondary | ICD-10-CM

## 2020-11-03 DIAGNOSIS — B351 Tinea unguium: Secondary | ICD-10-CM

## 2020-11-03 DIAGNOSIS — Z1283 Encounter for screening for malignant neoplasm of skin: Secondary | ICD-10-CM | POA: Diagnosis not present

## 2020-11-03 DIAGNOSIS — D229 Melanocytic nevi, unspecified: Secondary | ICD-10-CM

## 2020-11-03 DIAGNOSIS — L578 Other skin changes due to chronic exposure to nonionizing radiation: Secondary | ICD-10-CM | POA: Diagnosis not present

## 2020-11-03 DIAGNOSIS — D1800 Hemangioma unspecified site: Secondary | ICD-10-CM | POA: Diagnosis not present

## 2020-11-03 DIAGNOSIS — L918 Other hypertrophic disorders of the skin: Secondary | ICD-10-CM

## 2020-11-03 DIAGNOSIS — L814 Other melanin hyperpigmentation: Secondary | ICD-10-CM

## 2020-11-03 DIAGNOSIS — B353 Tinea pedis: Secondary | ICD-10-CM | POA: Diagnosis not present

## 2020-11-03 DIAGNOSIS — Z85828 Personal history of other malignant neoplasm of skin: Secondary | ICD-10-CM | POA: Diagnosis not present

## 2020-11-03 DIAGNOSIS — L570 Actinic keratosis: Secondary | ICD-10-CM

## 2020-11-03 DIAGNOSIS — L821 Other seborrheic keratosis: Secondary | ICD-10-CM | POA: Diagnosis not present

## 2020-11-03 DIAGNOSIS — L219 Seborrheic dermatitis, unspecified: Secondary | ICD-10-CM

## 2020-11-03 MED ORDER — MOMETASONE FUROATE 0.1 % EX SOLN: CUTANEOUS | 3 refills | Status: DC

## 2020-11-03 MED ORDER — KETOCONAZOLE 2 % EX CREA: TOPICAL_CREAM | CUTANEOUS | 3 refills | Status: DC

## 2020-11-03 NOTE — Progress Notes (Signed)
Follow-Up Visit   Subjective  Anthony [name] is a 85 y.o. male who presents for the following: Annual Exam (Hx BCC, hx AK's, hx of seborrheic dermatitis on the scalp - patient c/o itching even while using Ketoconazole 2% shampoo and Ketoconazole 2% cream.). The patient presents for Total-Body Skin Exam (TBSE) for skin cancer screening and mole check.  The following portions of the chart were reviewed this encounter and updated as appropriate:   Tobacco  Allergies  Meds  Problems  Med Hx  Surg Hx  Fam Hx     Review of Systems:  No other skin or systemic complaints except as noted in HPI or Assessment and Plan.  Objective  Well appearing patient in no apparent distress; mood and affect are within normal limits.  A full examination was performed including scalp, head, eyes, ears, nose, lips, neck, chest, axillae, abdomen, back, buttocks, bilateral upper extremities, bilateral lower extremities, hands, feet, fingers, toes, fingernails, and toenails. All findings within normal limits unless otherwise noted below.  Objective  Scalp: Pink patches with greasy scale.   Objective  B/L foot: Scaling and maceration web spaces and over distal and lateral soles. Toenail dystrophy.   Assessment & Plan  Seborrheic dermatitis Scalp Seborrheic Dermatitis  -  is a chronic persistent rash characterized by pinkness and scaling most commonly of the mid face but also can occur on the scalp (dandruff), ears; mid chest and mid back. It tends to be exacerbated by stress and cooler weather.  People who have neurologic disease may experience new onset or exacerbation of existing seborrheic dermatitis.  The condition is not curable but treatable and can be controlled.  Start Mometasone 0.1% solution to scalp QOD on Monday, Wednesday, and Friday. Continue Ketoconazole 2% shampoo 3d/wk, and Ketoconazole 2% cream QD.   mometasone (ELOCON) 0.1 % lotion - Scalp  AK (actinic keratosis) (2) L temple x 1, L  forehead x 1  Destruction of lesion - L temple x 1, L forehead x 1 Complexity: simple   Destruction method: cryotherapy   Informed consent: discussed and consent obtained   Timeout:  patient name, date of birth, surgical site, and procedure verified Lesion destroyed using liquid nitrogen: Yes   Region frozen until ice ball extended beyond lesion: Yes   Outcome: patient tolerated procedure well with no complications   Post-procedure details: wound care instructions given    Tinea pedis of both feet B/L foot And unguium -  Chronic and persistent. Start Ketoconazole 2% cream to feet QHS.  ketoconazole (NIZORAL) 2 % cream - B/L foot  Lentigines - Scattered tan macules - Due to sun exposure - Benign-appering, observe - Recommend daily broad spectrum sunscreen SPF 30+ to sun-exposed areas, reapply every 2 hours as needed. - Call for any changes  Seborrheic Keratoses - Stuck-on, waxy, tan-brown papules and plaques  - Discussed benign etiology and prognosis. - Observe - Call for any changes  Melanocytic Nevi - Tan-brown and/or pink-flesh-colored symmetric macules and papules - Benign appearing on exam today - Observation - Call clinic for new or changing moles - Recommend daily use of broad spectrum spf 30+ sunscreen to sun-exposed areas.   Hemangiomas - Red papules - Discussed benign nature - Observe - Call for any changes  Actinic Damage - Chronic, secondary to cumulative UV/sun exposure - diffuse scaly erythematous macules with underlying dyspigmentation - Recommend daily broad spectrum sunscreen SPF 30+ to sun-exposed areas, reapply every 2 hours as needed.  - Call for new or  changing lesions.  Purpura - Chronic; persistent and recurrent.  Treatable, but not curable. - Violaceous macules and patches - Benign - Related to trauma, age, sun damage and/or use of blood thinners, chronic use of topical and/or oral steroids - Observe - Can use OTC arnica containing  moisturizer such as Dermend Bruise Formula if desired - Call for worsening or other concerns  Acrochordons (Skin Tags) - Fleshy, skin-colored pedunculated papules - Benign appearing.  - Observe. - If desired, they can be removed with an in office procedure that is not covered by insurance. - Please call the clinic if you notice any new or changing lesions.  History of Basal Cell Carcinoma of the Skin - No evidence of recurrence today - Recommend regular full body skin exams - Recommend daily broad spectrum sunscreen SPF 30+ to sun-exposed areas, reapply every 2 hours as needed.  - Call if any new or changing lesions are noted between office visits  Return in about 1 year (around 11/03/2021) for TBSE - hx BCC, AK's; 2 months SD follow up .  Luther Redo, CMA, am acting as scribe for Sarina Ser, MD .  Documentation: I have reviewed the above documentation for accuracy and completeness, and I agree with the above.  Sarina Ser, MD

## 2020-11-04 MED ORDER — FINASTERIDE 5 MG PO TABS: ORAL_TABLET | ORAL | 0 refills | Status: DC

## 2020-11-04 NOTE — Telephone Encounter (Signed)
E-scribed refill 

## 2020-11-09 ENCOUNTER — Telehealth: Payer: Self-pay

## 2020-11-09 NOTE — Chronic Care Management (AMB) (Addendum)
Chronic Care Management Pharmacy Assistant   Name: Anthony [name]  MRN: [id number] DOB: 1936/01/04  Reason for Encounter: Disease State- Hypertension   Conditions to be addressed/monitored: HTN  Recent office visits:  10/06/20- Dr. Danise Mina- PCP- started trazodone 25-50 mg at bedtime  Recent consult visits:  11/03/20- Dr. Sarina Ser- dermatology- Started [facility] 0.1%  Hospital visits:  None in previous 6 months  Medications: Outpatient Encounter Medications as of 11/09/2020  Medication Sig   acetaminophen (TYLENOL) 500 MG tablet Take 1 tablet (500 mg total) by mouth 2 (two) times daily as needed for moderate pain.   Alirocumab (PRALUENT) 75 MG/ML SOAJ INJECT 1 PEN INTO THE SKIN EVERY 14 DAYS   amLODipine (NORVASC) 5 MG tablet TAKE 1 TABLET(5 MG) BY MOUTH DAILY   aspirin EC 81 MG tablet Take 1 tablet (81 mg total) by mouth daily.   Cholecalciferol (VITAMIN D3) 1000 units CAPS Take 1 capsule (1,000 Units total) by mouth daily.   clopidogrel (PLAVIX) 75 MG tablet Take 1 tablet (75 mg total) by mouth daily at 6 (six) AM.   diphenhydramine-acetaminophen (TYLENOL PM) 25-500 MG TABS tablet Take 1 tablet by mouth at bedtime.   docusate sodium (COLACE) 100 MG capsule Take 100 mg by mouth daily as needed.   ezetimibe (ZETIA) 10 MG tablet TAKE 1 TABLET(10 MG) BY MOUTH DAILY   fenofibrate 160 MG tablet TAKE 1 TABLET BY MOUTH EVERY DAY   finasteride (PROSCAR) 5 MG tablet TAKE 1 TABLET(5 MG) BY MOUTH DAILY   fluocinonide (LIDEX) 0.05 % external solution Apply 1 application topically 2 (two) times daily as needed. (Patient taking differently: Apply 1 application topically as needed.)   furosemide (LASIX) 40 MG tablet TAKE 1 TABLET BY MOUTH EVERY DAY. TAKE AN EXTRA TABLET AS NEEDED FOR WEIGHT GAIN OF 3 LBS/DAY OR 5LBS/WEEK   gabapentin (NEURONTIN) 300 MG capsule TAKE 2 CAPSULES BY MOUTH AT BEDTIME. WITH EXTRA CAPSULE DURING THE DAY AS NEEDED   guaiFENesin-codeine (CHERATUSSIN AC) 100-10  MG/5ML syrup Take 5 mLs by mouth 2 (two) times daily as needed for cough (do not mix with hydrocodone).   HYDROcodone-acetaminophen (NORCO) 10-325 MG tablet Take 1 tablet by mouth every 8 (eight) hours as needed.   icosapent Ethyl (VASCEPA) 1 g capsule Take 2 capsules (2 g total) by mouth 2 (two) times daily.   ketoconazole (NIZORAL) 2 % cream Apply 1 application topically daily as needed (for dermatitits).    ketoconazole (NIZORAL) 2 % cream Apply to the feet and face QHS.   ketoconazole (NIZORAL) 2 % shampoo Apply topically as directed. Apply 3 times weekly   levothyroxine (SYNTHROID) 175 MCG tablet TAKE 1 TABLET(175 MCG) BY MOUTH DAILY BEFORE BREAKFAST   LINZESS 290 MCG CAPS capsule TAKE 1 CAPSULE(290 MCG) BY MOUTH DAILY 30 MINUTES BEFORE FIRST MEAL OF THE DAY ON AN EMPTY STOMACH   lisinopril (ZESTRIL) 10 MG tablet Take 1 tablet (10 mg total) by mouth daily.   Melatonin 10 MG CAPS Take by mouth.   mometasone (ELOCON) 0.1 % lotion Apply to scalp QHS on Monday, Wednesday, and Friday.   Multiple Vitamins-Minerals (CENTRUM SILVER PO) Take 1 tablet by mouth daily.   Multiple Vitamins-Minerals (PRESERVISION AREDS 2 PO) Take 1 tablet by mouth daily.   nitroGLYCERIN (NITROSTAT) 0.4 MG SL tablet Place 1 tablet (0.4 mg total) under the tongue every 5 (five) minutes as needed for chest pain. (Patient taking differently: Place 0.4 mg under the tongue every 5 (five) minutes as needed for  chest pain. Patient has not used in 10 years)   tiZANidine (ZANAFLEX) 2 MG tablet TAKE 1 TO 2 TABLETS(2 TO 4 MG) BY MOUTH TWICE DAILY AS NEEDED FOR MUSCLE SPASMS   traZODone (DESYREL) 50 MG tablet Take 0.5-1 tablets (25-50 mg total) by mouth at bedtime as needed for sleep.   No facility-administered encounter medications on file as of 11/09/2020.    Recent Office Vitals: BP Readings from Last 3 Encounters:  10/06/20 132/82  06/30/20 118/78  06/03/20 124/80   Pulse Readings from Last 3 Encounters:  10/06/20 87   06/30/20 (!) 105  06/03/20 (!) 102    Wt Readings from Last 3 Encounters:  10/06/20 283 lb 5 oz (128.5 kg)  06/30/20 295 lb 9 oz (134.1 kg)  06/03/20 297 lb (134.7 kg)     Kidney Function Lab Results  Component Value Date/Time   CREATININE 1.70 (H) 09/28/2020 02:02 PM   CREATININE 2.06 (H) 07/21/2020 01:40 PM   GFR 36.96 (L) 09/28/2020 02:02 PM   GFRNONAA 39 (L) 05/06/2020 06:35 AM   GFRAA 45 (L) 05/06/2020 06:35 AM    BMP Latest Ref Rng & Units 09/28/2020 07/21/2020 06/30/2020  Glucose 70 - 99 mg/dL 105(H) 126(H) 130(H)  BUN 6 - 23 mg/dL 36(H) 29(H) 29(H)  Creatinine 0.40 - 1.50 mg/dL 1.70(H) 2.06(H) 2.00(H)  BUN/Creat Ratio 10 - 24 - - -  Sodium 135 - 145 mEq/L 136 136 138  Potassium 3.5 - 5.1 mEq/L 4.1 4.0 4.3  Chloride 96 - 112 mEq/L 103 100 102  CO2 19 - 32 mEq/L 27 30 30   Calcium 8.4 - 10.5 mg/dL 10.1 10.5 10.2   Current antihypertensive regimen:  Lisinopril 10 mg - 1 tablet daily Amlodipine 5 mg - 1 tablet daily  How often are you checking your Blood Pressure? weekly-every 2 weeks  he checks his blood pressure in the afternoon after taking his medication.  Current home BP readings:  States he only checks about once every 2 weeks.  DATE:             BP               PULSE    -  121/80  -    -   122/84  -   Wrist or arm cuff: Arm Caffeine intake: None Salt intake: No salt added to food.  OTC medications including pseudoephedrine or NSAIDs? No   What recent interventions/DTPs have been made by any provider to improve Blood Pressure control since last CPP Visit:  None   Any recent hospitalizations or ED visits since last visit with CPP? No  What diet changes have been made to improve Blood Pressure Control?  States he has cut out sweets. He still eats quite a bit of fast food.  What exercise is being done to improve your Blood Pressure Control?  States he lifting arm weights and doing sit ups   Adherence Review: Is the patient currently on ACE/ARB  medication? Yes Does the patient have >5 day gap between last estimated fill dates? No gaps in adherence   Star Rating Drugs:  Medication:  Last Fill: Day Supply Lisinopril 10 mg 10/22/20 90  He states he has lost 12 pounds since November. He is very happy with his weight loss so far. He states he eats eggs and bacon for breakfast, a McChicken with only mayonnaise for lunch and a [place] sandwich on wheat bread for dinner. He was advised of his next appointment with Sharyn Lull  02/15/21 at 10:00 am.   Follow-Up:  Pharmacist Review  Debbora Dus, CPP notified  Margaretmary Dys, [place] Assistant [phone]  I have reviewed the care management and care coordination activities outlined in this encounter and I am certifying that I agree with the content of this note. No further action required.  Debbora Dus, PharmD Clinical Pharmacist [facility] at [facility] [phone]

## 2020-11-15 ENCOUNTER — Other Ambulatory Visit: Payer: Self-pay | Admitting: Internal Medicine

## 2020-11-15 DIAGNOSIS — E782 Mixed hyperlipidemia: Secondary | ICD-10-CM

## 2020-11-23 ENCOUNTER — Telehealth: Payer: Self-pay

## 2020-11-23 NOTE — Telephone Encounter (Signed)
Pt stopped in the office to request Lunesta. He said he used it a few years ago and it worked well for him and really needs to start it again. He said he talked to the pharmacist today. Advised Dr. Darnell Level may request OV to discuss before sending script in and pt stated, "I need it sent in today or I am going to die." He then requested that it be sent in today so he could have it tonight. He wanted to know what time it would be sent in. Advised PCP is in clinic and the clinic has up to 72 hours for refill requests and a msg would be sent to Dr. Darnell Level. Advised pt this office would f/u if an OV was necessary. Pt verbalized understanding.

## 2020-11-23 NOTE — Telephone Encounter (Signed)
Patient left voicemail today at 10 AM. States he is not sleeping well with trazodone. Reports he used to be on Lunesta and wants this filled today. Per chart, previously did not notice much improvement on Lunesta, but he wants to try it again. He is aware pharmacist is not able to prescribe and medication must be approved by PCP. I will relay message to PCP.  Debbora Dus, PharmD Clinical Pharmacist [facility] at [facility] [phone]

## 2020-11-26 ENCOUNTER — Other Ambulatory Visit: Payer: Self-pay | Admitting: Family Medicine

## 2020-11-26 MED ORDER — ESZOPICLONE 1 MG PO TABS: 1.0000 mg | ORAL_TABLET | Freq: Every evening | ORAL | 0 refills | Status: DC | PRN

## 2020-11-26 NOTE — Telephone Encounter (Signed)
Ok to retry lunesta - I've sent this in for him.

## 2020-11-26 NOTE — Telephone Encounter (Signed)
Pt made aware via phn call.  Verbalizes understanding.

## 2020-11-30 ENCOUNTER — Other Ambulatory Visit (INDEPENDENT_AMBULATORY_CARE_PROVIDER_SITE_OTHER): Payer: Self-pay | Admitting: Nurse Practitioner

## 2020-11-30 DIAGNOSIS — I714 Abdominal aortic aneurysm, without rupture, unspecified: Secondary | ICD-10-CM

## 2020-12-01 ENCOUNTER — Ambulatory Visit (INDEPENDENT_AMBULATORY_CARE_PROVIDER_SITE_OTHER): Payer: Medicare Other

## 2020-12-01 ENCOUNTER — Other Ambulatory Visit: Payer: Self-pay

## 2020-12-01 ENCOUNTER — Ambulatory Visit (INDEPENDENT_AMBULATORY_CARE_PROVIDER_SITE_OTHER): Payer: Medicare Other | Admitting: Nurse Practitioner

## 2020-12-01 VITALS — BP 138/84 | HR 70 | Ht 72.0 in | Wt 279.0 lb

## 2020-12-01 DIAGNOSIS — I714 Abdominal aortic aneurysm, without rupture, unspecified: Secondary | ICD-10-CM

## 2020-12-01 DIAGNOSIS — E782 Mixed hyperlipidemia: Secondary | ICD-10-CM

## 2020-12-01 DIAGNOSIS — I10 Essential (primary) hypertension: Secondary | ICD-10-CM

## 2020-12-01 NOTE — Telephone Encounter (Signed)
-----   Message from [place], [facility] sent at 12/01/2020  4:34 PM EDT ----- Regarding: Hydrocodone Refill Pt called to request refill on hydrocodone.  Thanks,  Debbora Dus, PharmD Clinical Pharmacist [facility] at [facility] [phone]

## 2020-12-01 NOTE — Telephone Encounter (Signed)
Name of Medication: Hydrocodone-APAP Name of Pharmacy: [facility] [place] or Written Date and Quantity: 08/13/20, #20 Last Office Visit and Type: 10/06/20, insomnia Next Office Visit and Type: 12/22/20, AWV prt 2 Last Controlled Substance Agreement Date: 07/04/19 Last UDS: 07/04/19

## 2020-12-02 MED ORDER — HYDROCODONE-ACETAMINOPHEN 10-325 MG PO TABS: 1.0000 | ORAL_TABLET | Freq: Three times a day (TID) | ORAL | 0 refills | Status: DC | PRN

## 2020-12-02 NOTE — Telephone Encounter (Signed)
ERx 

## 2020-12-04 ENCOUNTER — Other Ambulatory Visit: Payer: Self-pay | Admitting: Dermatology

## 2020-12-06 ENCOUNTER — Encounter (INDEPENDENT_AMBULATORY_CARE_PROVIDER_SITE_OTHER): Payer: Self-pay | Admitting: Nurse Practitioner

## 2020-12-06 NOTE — Progress Notes (Signed)
Subjective:    Patient ID: Anthony [name], male    DOB: 1936/01/07, 85 y.o.   MRN: [id number] Chief Complaint  Patient presents with  . Follow-up    6 Mo evar    The patient returns to the office for surveillance of an abdominal aortic aneurysm status post stent graft placement on 05/05/2020.   Patient denies abdominal pain or back pain, no other abdominal complaints. No groin related complaints. No symptoms consistent with distal embolization No changes in claudication distance.   There have been no interval changes in his overall healthcare since his last visit.   Patient denies amaurosis fugax or TIA symptoms. There is no history of claudication or rest pain symptoms of the lower extremities. The patient denies angina or shortness of breath.   Duplex US of the aorta and iliac arteries shows a 4.06 AAA sac with no  endoleak, no change in the sac compared to the previous study.   Review of Systems  Cardiovascular: Positive for leg swelling.  All other systems reviewed and are negative.      Objective:   Physical Exam Vitals reviewed.  HENT:     Head: Normocephalic.  Cardiovascular:     Rate and Rhythm: Normal rate.     Pulses: Normal pulses.  Pulmonary:     Effort: Pulmonary effort is normal.  Musculoskeletal:        General: Normal range of motion.  Neurological:     Mental Status: He is alert and oriented to person, place, and time.  Psychiatric:        Mood and Affect: Mood normal.        Behavior: Behavior normal.        Thought Content: Thought content normal.        Judgment: Judgment normal.     BP 138/84   Pulse 70   Ht 6' (1.829 m)   Wt 279 lb (126.6 kg)   BMI 37.84 kg/m   Past Medical History:  Diagnosis Date  . Anosmia 1980s  . Atrial flutter ([place]) 2018   had an ablation with dr. Erick Alley  . CAD (coronary artery disease)   . Cancer ([place])    skin cancer  . Chronic constipation   . Chronic insomnia   . Chronic lower back pain    s/p spine  stimulator placement  . Complication of anesthesia    when under general, he woke up agitated and unable to be held down  . Diastolic CHF ([place])   . History of diverticulitis 2017  . History of pneumonia 2013  . Hyperlipidemia   . Hypertension   . Hypothyroidism   . Obesity, Class II, BMI 35-39.9, with comorbidity   . OSA on CPAP   . PONV (postoperative nausea and vomiting)     Social History   Socioeconomic History  . Marital status: Widowed    Spouse name: Not on file  . Number of children: Not on file  . Years of education: Not on file  . Highest education level: Not on file  Occupational History  . Not on file  Tobacco Use  . Smoking status: Never Smoker  . Smokeless tobacco: Never Used  Vaping Use  . Vaping Use: Never used  Substance and Sexual Activity  . Alcohol use: No    Comment: QUIT DRINKING  . Drug use: No  . Sexual activity: Not Currently  Other Topics Concern  . Not on file  Social History Narrative   Widower (  wife passed 08/2014).    Lives alone, GF Miss [name] sometimes (she was nurse)   Son is [name] (FP at [employer]). Other son is a Psychologist, sport and exercise in [place]: retired, used to Banker   Edu: [employer]   Social Determinants of Radio broadcast assistant Strain: Not on file  Food Insecurity: Not on file  Transportation Needs: Not on file  Physical Activity: Not on file  Stress: Not on file  Social Connections: Not on file  Intimate Partner Violence: Not on file    Past Surgical History:  Procedure Laterality Date  . A-FLUTTER ABLATION N/A 05/07/2017   Procedure: A-Flutter Ablation;  Surgeon: Deboraha Sprang, MD;  Location: [place] [facility];  Service: Cardiovascular;  Laterality: N/A;  . CARDIOVASCULAR STRESS TEST  11/2017   no ischemia, low risk study  . COLONOSCOPY  2007   normal per prior PCP records, rpt 10 yrs (Dr Osie Cheeks)  . ELBOW SURGERY Right    ulnar nerve decompression.  PT DOES NOT RECALL THIS PROCEDURE   . ENDOVASCULAR REPAIR/STENT GRAFT N/A 05/05/2020   Procedure: ENDOVASCULAR REPAIR/STENT GRAFT;  Surgeon: Algernon Huxley, MD;  Location: [place] [facility];  Service: Cardiovascular;  Laterality: N/A;  . LAMINECTOMY THORACIC SPINE W/ PLACEMENT SPINAL CORD STIMULATOR  08/2018   and removal of prior spine stimulator (Dr Lacinda Axon)  . NASAL SINUS SURGERY     nasal polyps. done a long time ago  . PERCUTANEOUS CORONARY STENT INTERVENTION (PCI-S)  2013   EF55%, 70% mid LAD, 99% mid RCA, mild MR, elev LVEDP, DES to mid LAD. RCA is nondominant  . RADIOFREQUENCY ABLATION  06/2017   lumbar region [facility])   . REPLACEMENT TOTAL KNEE BILATERAL Bilateral 2000s  . SPINAL CORD STIMULATOR IMPLANT  06/2016  . SPINAL CORD STIMULATOR INSERTION N/A 09/09/2018   Procedure: LUMBAR SPINAL CORD STIMULATOR LEAD AND BATTERY REMOVAL AND LAMINECTOMY FOR PLACEMENT OF PADDLE;  Surgeon: Deetta Perla, MD;  Location: [facility];  Service: Neurosurgery;  Laterality: N/A;  . SPINAL CORD STIMULATOR INSERTION N/A 09/16/2018   Procedure: PLACEMENT OF SPINAL CORD STIMULATOR BATTERY;  Surgeon: Deetta Perla, MD;  Location: [facility];  Service: Neurosurgery;  Laterality: N/A;    Family History  Problem Relation Age of Onset  . Stroke Mother   . Kidney disease Father   . Leukemia Brother   . Stroke Sister   . Diabetes Neg Hx     Allergies  Allergen Reactions  . Questran [Cholestyramine]     Patient not aware of an allergy to this medicine.  . Atorvastatin     Muscle pain  . Gemfibrozil Other (See Comments)    Muscle pain.  . Metformin And Related     Dizziness  . Rosuvastatin     Muscle pain    CBC Latest Ref Rng & Units 07/21/2020 06/30/2020 05/06/2020  WBC 3.8 - 10.8 Thousand/uL 10.4 9.4 12.3(H)  Hemoglobin 13.2 - 17.1 g/dL 13.9 13.5 10.8(L)  Hematocrit 38.5 - 50.0 % 41.0 40.3 32.1(L)  Platelets 140 - 400 Thousand/uL 320 325.0 204      CMP     Component Value Date/Time   NA 136 09/28/2020 1402   NA 139 05/03/2017  1037   K 4.1 09/28/2020 1402   CL 103 09/28/2020 1402   CO2 27 09/28/2020 1402   GLUCOSE 105 (H) 09/28/2020 1402   BUN 36 (H) 09/28/2020 1402   BUN 23 05/03/2017 1037  CREATININE 1.70 (H) 09/28/2020 1402   CALCIUM 10.1 09/28/2020 1402   PROT 7.5 07/21/2020 1340   ALBUMIN 4.1 09/28/2020 1402   AST 29 10/06/2019 1031   ALT 29 10/06/2019 1031   ALKPHOS 43 10/06/2019 1031   BILITOT 0.7 10/06/2019 1031   GFRNONAA 39 (L) 05/06/2020 0635   GFRAA 45 (L) 05/06/2020 0635     No results found.     Assessment & Plan:   1. Abdominal aortic aneurysm (AAA) without rupture (HCC) Recommend: Patient is status post successful endovascular repair of the AAA.   No further intervention is required at this time.   No endoleak is detected and the aneurysm sac is stable.  The patient will continue antiplatelet therapy as prescribed as well as aggressive management of hyperlipidemia. Exercise is again strongly encouraged.   However, endografts require continued surveillance with ultrasound or CT scan. This is mandatory to detect any changes that allow repressurization of the aneurysm sac.  The patient is informed that this would be asymptomatic.  The patient is reminded that lifelong routine surveillance is a necessity with an endograft. Patient will continue to follow-up at 12 month intervals with ultrasound of the aorta.  2. Essential hypertension Continue antihypertensive medications as already ordered, these medications have been reviewed and there are no changes at this time.   3. Hypercholesterolemia with hypertriglyceridemia Continue statin as ordered and reviewed, no changes at this time    Current Outpatient Medications on File Prior to Visit  Medication Sig Dispense Refill  . acetaminophen (TYLENOL) 500 MG tablet Take 1 tablet (500 mg total) by mouth 2 (two) times daily as needed for moderate pain.    Marland Kitchen amLODipine (NORVASC) 5 MG tablet TAKE 1 TABLET(5 MG) BY MOUTH DAILY 30 tablet 2   . aspirin EC 81 MG tablet Take 1 tablet (81 mg total) by mouth daily.    . Cholecalciferol (VITAMIN D3) 1000 units CAPS Take 1 capsule (1,000 Units total) by mouth daily. 30 capsule   . clopidogrel (PLAVIX) 75 MG tablet Take 1 tablet (75 mg total) by mouth daily at 6 (six) AM. 90 tablet 3  . diphenhydramine-acetaminophen (TYLENOL PM) 25-500 MG TABS tablet Take 1 tablet by mouth at bedtime.    . docusate sodium (COLACE) 100 MG capsule Take 100 mg by mouth daily as needed.    . eszopiclone (LUNESTA) 1 MG TABS tablet Take 1 tablet (1 mg total) by mouth at bedtime as needed for sleep. Use sparingly. Take immediately before bedtime 30 tablet 0  . ezetimibe (ZETIA) 10 MG tablet TAKE 1 TABLET(10 MG) BY MOUTH DAILY 90 tablet 3  . fenofibrate 160 MG tablet TAKE 1 TABLET BY MOUTH EVERY DAY 90 tablet 3  . finasteride (PROSCAR) 5 MG tablet TAKE 1 TABLET(5 MG) BY MOUTH DAILY 90 tablet 0  . fluocinonide (LIDEX) 0.05 % external solution Apply 1 application topically 2 (two) times daily as needed. (Patient taking differently: Apply 1 application topically as needed.) 60 mL 2  . furosemide (LASIX) 40 MG tablet TAKE 1 TABLET BY MOUTH EVERY DAY. TAKE AN EXTRA TABLET AS NEEDED FOR WEIGHT GAIN OF 3 LBS/DAY OR 5LBS/WEEK 135 tablet 3  . gabapentin (NEURONTIN) 300 MG capsule TAKE 2 CAPSULES BY MOUTH AT BEDTIME. WITH EXTRA CAPSULE DURING THE DAY AS NEEDED 200 capsule 1  . guaiFENesin-codeine (CHERATUSSIN AC) 100-10 MG/5ML syrup Take 5 mLs by mouth 2 (two) times daily as needed for cough (do not mix with hydrocodone). 120 mL 0  . icosapent  Ethyl (VASCEPA) 1 g capsule Take 2 capsules (2 g total) by mouth 2 (two) times daily. 120 capsule 11  . ketoconazole (NIZORAL) 2 % cream Apply 1 application topically daily as needed (for dermatitits).     Marland Kitchen ketoconazole (NIZORAL) 2 % cream Apply to the feet and face QHS. 60 g 3  . levothyroxine (SYNTHROID) 175 MCG tablet TAKE 1 TABLET(175 MCG) BY MOUTH DAILY BEFORE BREAKFAST 90 tablet 3   . LINZESS 290 MCG CAPS capsule TAKE 1 CAPSULE(290 MCG) BY MOUTH DAILY 30 MINUTES BEFORE FIRST MEAL OF THE DAY ON AN EMPTY STOMACH 90 capsule 1  . lisinopril (ZESTRIL) 10 MG tablet Take 1 tablet (10 mg total) by mouth daily. 90 tablet 1  . Melatonin 10 MG CAPS Take by mouth.    . mometasone (ELOCON) 0.1 % lotion Apply to scalp QHS on Monday, Wednesday, and Friday. 60 mL 3  . Multiple Vitamins-Minerals (CENTRUM SILVER PO) Take 1 tablet by mouth daily.    . Multiple Vitamins-Minerals (PRESERVISION AREDS 2 PO) Take 1 tablet by mouth daily.    . nitroGLYCERIN (NITROSTAT) 0.4 MG SL tablet Place 1 tablet (0.4 mg total) under the tongue every 5 (five) minutes as needed for chest pain. (Patient taking differently: Place 0.4 mg under the tongue every 5 (five) minutes as needed for chest pain. Patient has not used in 10 years) 25 tablet 0  . PRALUENT 75 MG/ML SOAJ INJECT 1 PEN INTO THE SKIN EVERY 14 DAYS 2 mL 9  . tiZANidine (ZANAFLEX) 2 MG tablet TAKE 1 TO 2 TABLETS(2 TO 4 MG) BY MOUTH TWICE DAILY AS NEEDED FOR MUSCLE SPASMS 30 tablet 0  . traZODone (DESYREL) 50 MG tablet Take 0.5-1 tablets (25-50 mg total) by mouth at bedtime as needed for sleep. 30 tablet 3   No current facility-administered medications on file prior to visit.    There are no Patient Instructions on file for this visit. No follow-ups on file.   Kris Hartmann, NP

## 2020-12-11 ENCOUNTER — Other Ambulatory Visit: Payer: Self-pay | Admitting: Family Medicine

## 2020-12-11 DIAGNOSIS — E039 Hypothyroidism, unspecified: Secondary | ICD-10-CM

## 2020-12-11 DIAGNOSIS — N1832 Chronic kidney disease, stage 3b: Secondary | ICD-10-CM

## 2020-12-11 DIAGNOSIS — E782 Mixed hyperlipidemia: Secondary | ICD-10-CM

## 2020-12-15 ENCOUNTER — Ambulatory Visit (INDEPENDENT_AMBULATORY_CARE_PROVIDER_SITE_OTHER): Payer: Medicare Other

## 2020-12-15 ENCOUNTER — Other Ambulatory Visit: Payer: Self-pay

## 2020-12-15 DIAGNOSIS — Z0000 Encounter for general adult medical examination without abnormal findings: Secondary | ICD-10-CM

## 2020-12-15 NOTE — Patient Instructions (Addendum)
Anthony [name] , Thank you for taking time to come for your Medicare Wellness Visit. I appreciate your ongoing commitment to your health goals. Please review the following plan we discussed and let me know if I can assist you in the future.   Screening recommendations/referrals: Colonoscopy: no longer required  Recommended yearly ophthalmology/optometry visit for glaucoma screening and checkup Recommended yearly dental visit for hygiene and checkup  Vaccinations: Influenza vaccine: Up to date, completed 06/30/2020, due 03/2021 Pneumococcal vaccine: Completed series Tdap vaccine: Up to date, completed 10/09/2011, due 09/2021 Shingles vaccine: due, check with your insurance regarding coverage if interested    Covid-19: Completed series. 4th Booster due 01/02/2021  Advanced directives: Please bring a copy of your POA (Power of [place]) and/or Living Will to your next appointment.   Conditions/risks identified: hypertension  Next appointment: Follow up in one year for your annual wellness visit.   Preventive Care 83 Years and Older, Male Preventive care refers to lifestyle choices and visits with your health care provider that can promote health and wellness. What does preventive care include?  A yearly physical exam. This is also called an annual well check.  Dental exams once or twice a year.  Routine eye exams. Ask your health care provider how often you should have your eyes checked.  Personal lifestyle choices, including:  Daily care of your teeth and gums.  Regular physical activity.  Eating a healthy diet.  Avoiding tobacco and drug use.  Limiting alcohol use.  Practicing safe sex.  Taking low doses of aspirin every day.  Taking vitamin and mineral supplements as recommended by your health care provider. What happens during an annual well check? The services and screenings done by your health care provider during your annual well check will depend on your age, overall  health, lifestyle risk factors, and family history of disease. Counseling  Your health care provider may ask you questions about your:  Alcohol use.  Tobacco use.  Drug use.  Emotional well-being.  Home and relationship well-being.  Sexual activity.  Eating habits.  History of falls.  Memory and ability to understand (cognition).  Work and work Statistician. Screening  You may have the following tests or measurements:  Height, weight, and BMI.  Blood pressure.  Lipid and cholesterol levels. These may be checked every 5 years, or more frequently if you are over 80 years old.  Skin check.  Lung cancer screening. You may have this screening every year starting at age 83 if you have a 30-pack-year history of smoking and currently smoke or have quit within the past 15 years.  Fecal occult blood test (FOBT) of the stool. You may have this test every year starting at age 84.  Flexible sigmoidoscopy or colonoscopy. You may have a sigmoidoscopy every 5 years or a colonoscopy every 10 years starting at age 83.  Prostate cancer screening. Recommendations will vary depending on your family history and other risks.  Hepatitis C blood test.  Hepatitis B blood test.  Sexually transmitted disease (STD) testing.  Diabetes screening. This is done by checking your blood sugar (glucose) after you have not eaten for a while (fasting). You may have this done every 1-3 years.  Abdominal aortic aneurysm (AAA) screening. You may need this if you are a current or former smoker.  Osteoporosis. You may be screened starting at age 71 if you are at high risk. Talk with your health care provider about your test results, treatment options, and if necessary, the need  for more tests. Vaccines  Your health care provider may recommend certain vaccines, such as:  Influenza vaccine. This is recommended every year.  Tetanus, diphtheria, and acellular pertussis (Tdap, Td) vaccine. You may need a Td  booster every 10 years.  Zoster vaccine. You may need this after age 36.  Pneumococcal 13-valent conjugate (PCV13) vaccine. One dose is recommended after age 89.  Pneumococcal polysaccharide (PPSV23) vaccine. One dose is recommended after age 22. Talk to your health care provider about which screenings and vaccines you need and how often you need them. This information is not intended to replace advice given to you by your health care provider. Make sure you discuss any questions you have with your health care provider. Document Released: 09/10/2015 Document Revised: 05/03/2016 Document Reviewed: 06/15/2015 Elsevier Interactive Patient Education  2017 [place] Prevention in the Home Falls can cause injuries. They can happen to people of all ages. There are many things you can do to make your home safe and to help prevent falls. What can I do on the outside of my home?  Regularly fix the edges of walkways and driveways and fix any cracks.  Remove anything that might make you trip as you walk through a door, such as a raised step or threshold.  Trim any bushes or trees on the path to your home.  Use bright outdoor lighting.  Clear any walking paths of anything that might make someone trip, such as rocks or tools.  Regularly check to see if handrails are loose or broken. Make sure that both sides of any steps have handrails.  Any raised decks and porches should have guardrails on the edges.  Have any leaves, snow, or ice cleared regularly.  Use sand or salt on walking paths during winter.  Clean up any spills in your garage right away. This includes oil or grease spills. What can I do in the bathroom?  Use night lights.  Install grab bars by the toilet and in the tub and shower. Do not use towel bars as grab bars.  Use non-skid mats or decals in the tub or shower.  If you need to sit down in the shower, use a plastic, non-slip stool.  Keep the floor dry. Clean up  any water that spills on the floor as soon as it happens.  Remove soap buildup in the tub or shower regularly.  Attach bath mats securely with double-sided non-slip rug tape.  Do not have throw rugs and other things on the floor that can make you trip. What can I do in the bedroom?  Use night lights.  Make sure that you have a light by your bed that is easy to reach.  Do not use any sheets or blankets that are too big for your bed. They should not hang down onto the floor.  Have a firm chair that has side arms. You can use this for support while you get dressed.  Do not have throw rugs and other things on the floor that can make you trip. What can I do in the kitchen?  Clean up any spills right away.  Avoid walking on wet floors.  Keep items that you use a lot in easy-to-reach places.  If you need to reach something above you, use a strong step stool that has a grab bar.  Keep electrical cords out of the way.  Do not use floor polish or wax that makes floors slippery. If you must use wax, use  non-skid floor wax.  Do not have throw rugs and other things on the floor that can make you trip. What can I do with my stairs?  Do not leave any items on the stairs.  Make sure that there are handrails on both sides of the stairs and use them. Fix handrails that are broken or loose. Make sure that handrails are as long as the stairways.  Check any carpeting to make sure that it is firmly attached to the stairs. Fix any carpet that is loose or worn.  Avoid having throw rugs at the top or bottom of the stairs. If you do have throw rugs, attach them to the floor with carpet tape.  Make sure that you have a light switch at the top of the stairs and the bottom of the stairs. If you do not have them, ask someone to add them for you. What else can I do to help prevent falls?  Wear shoes that:  Do not have high heels.  Have rubber bottoms.  Are comfortable and fit you well.  Are  closed at the toe. Do not wear sandals.  If you use a stepladder:  Make sure that it is fully opened. Do not climb a closed stepladder.  Make sure that both sides of the stepladder are locked into place.  Ask someone to hold it for you, if possible.  Clearly mark and make sure that you can see:  Any grab bars or handrails.  First and last steps.  Where the edge of each step is.  Use tools that help you move around (mobility aids) if they are needed. These include:  Canes.  Walkers.  Scooters.  Crutches.  Turn on the lights when you go into a dark area. Replace any light bulbs as soon as they burn out.  Set up your furniture so you have a clear path. Avoid moving your furniture around.  If any of your floors are uneven, fix them.  If there are any pets around you, be aware of where they are.  Review your medicines with your doctor. Some medicines can make you feel dizzy. This can increase your chance of falling. Ask your doctor what other things that you can do to help prevent falls. This information is not intended to replace advice given to you by your health care provider. Make sure you discuss any questions you have with your health care provider. Document Released: 06/10/2009 Document Revised: 01/20/2016 Document Reviewed: 09/18/2014 Elsevier Interactive Patient Education  2017 [employer].

## 2020-12-15 NOTE — Progress Notes (Signed)
PCP notes:  Health Maintenance: No gaps noted    Abnormal Screenings: none   Patient concerns: none   Nurse concerns: none   Next PCP appt.: 12/22/2020 @ 3 pm

## 2020-12-15 NOTE — Progress Notes (Signed)
Subjective:   Anthony [name] is a 85 y.o. male who presents for Medicare Annual/Subsequent preventive examination.  Review of Systems: N/A      I connected with the patient today by telephone and verified that I am speaking with the correct person using two identifiers. Location patient: home Location nurse: work Persons participating in the telephone visit: patient, nurse.   I discussed the limitations, risks, security and privacy concerns of performing an evaluation and management service by telephone and the availability of in person appointments. I also discussed with the patient that there may be a patient responsible charge related to this service. The patient expressed understanding and verbally consented to this telephonic visit.        Cardiac Risk Factors include: advanced age (>93men, >28 women);hypertension;male gender     Objective:    Today's Vitals   There is no height or weight on file to calculate BMI.  Advanced Directives 12/15/2020 05/05/2020 04/30/2020 10/30/2019 10/24/2018 08/22/2018 03/08/2018  Does Patient Have a Medical Advance Directive? Yes Yes Yes Yes Yes Yes Yes  Type of Paramedic of [place];Living will [place];Living will - [place];Living will [place];Living will [place];Living will [place];Living will  Does patient want to make changes to medical advance directive? - No - Patient declined - - - No - Patient declined No - Patient declined  Copy of [place] in Chart? No - copy requested Yes - validated most recent copy scanned in chart (See row information) - No - copy requested No - copy requested No - copy requested No - copy requested  Would patient like information on creating a medical advance directive? - - - - - - -    Current Medications (verified) Outpatient Encounter Medications as of 12/15/2020   Medication Sig  . acetaminophen (TYLENOL) 500 MG tablet Take 1 tablet (500 mg total) by mouth 2 (two) times daily as needed for moderate pain.  Marland Kitchen amLODipine (NORVASC) 5 MG tablet TAKE 1 TABLET(5 MG) BY MOUTH DAILY  . aspirin EC 81 MG tablet Take 1 tablet (81 mg total) by mouth daily.  . Cholecalciferol (VITAMIN D3) 1000 units CAPS Take 1 capsule (1,000 Units total) by mouth daily.  . clopidogrel (PLAVIX) 75 MG tablet Take 1 tablet (75 mg total) by mouth daily at 6 (six) AM.  . diphenhydramine-acetaminophen (TYLENOL PM) 25-500 MG TABS tablet Take 1 tablet by mouth at bedtime.  . docusate sodium (COLACE) 100 MG capsule Take 100 mg by mouth daily as needed.  . eszopiclone (LUNESTA) 1 MG TABS tablet Take 1 tablet (1 mg total) by mouth at bedtime as needed for sleep. Use sparingly. Take immediately before bedtime  . ezetimibe (ZETIA) 10 MG tablet TAKE 1 TABLET(10 MG) BY MOUTH DAILY  . fenofibrate 160 MG tablet TAKE 1 TABLET BY MOUTH EVERY DAY  . finasteride (PROSCAR) 5 MG tablet TAKE 1 TABLET(5 MG) BY MOUTH DAILY  . fluocinonide (LIDEX) 0.05 % external solution Apply 1 application topically 2 (two) times daily as needed. (Patient taking differently: Apply 1 application topically as needed.)  . furosemide (LASIX) 40 MG tablet TAKE 1 TABLET BY MOUTH EVERY DAY. TAKE AN EXTRA TABLET AS NEEDED FOR WEIGHT GAIN OF 3 LBS/DAY OR 5LBS/WEEK  . gabapentin (NEURONTIN) 300 MG capsule TAKE 2 CAPSULES BY MOUTH AT BEDTIME. WITH EXTRA CAPSULE DURING THE DAY AS NEEDED  . guaiFENesin-codeine (CHERATUSSIN AC) 100-10 MG/5ML  syrup Take 5 mLs by mouth 2 (two) times daily as needed for cough (do not mix with hydrocodone).  Marland Kitchen HYDROcodone-acetaminophen (NORCO) 10-325 MG tablet Take 1 tablet by mouth every 8 (eight) hours as needed.  Marland Kitchen icosapent Ethyl (VASCEPA) 1 g capsule Take 2 capsules (2 g total) by mouth 2 (two) times daily.  Marland Kitchen ketoconazole (NIZORAL) 2 % cream Apply 1 application topically daily as needed (for dermatitits).    Marland Kitchen ketoconazole (NIZORAL) 2 % cream Apply to the feet and face QHS.  Marland Kitchen ketoconazole (NIZORAL) 2 % shampoo APPLY TOPICALLY 3 TIMES WEEKLY AS DIRECTED  . levothyroxine (SYNTHROID) 175 MCG tablet TAKE 1 TABLET(175 MCG) BY MOUTH DAILY BEFORE BREAKFAST  . LINZESS 290 MCG CAPS capsule TAKE 1 CAPSULE(290 MCG) BY MOUTH DAILY 30 MINUTES BEFORE FIRST MEAL OF THE DAY ON AN EMPTY STOMACH  . lisinopril (ZESTRIL) 10 MG tablet Take 1 tablet (10 mg total) by mouth daily.  . Melatonin 10 MG CAPS Take by mouth.  . mometasone (ELOCON) 0.1 % lotion Apply to scalp QHS on Monday, Wednesday, and Friday.  . Multiple Vitamins-Minerals (CENTRUM SILVER PO) Take 1 tablet by mouth daily.  . Multiple Vitamins-Minerals (PRESERVISION AREDS 2 PO) Take 1 tablet by mouth daily.  . nitroGLYCERIN (NITROSTAT) 0.4 MG SL tablet Place 1 tablet (0.4 mg total) under the tongue every 5 (five) minutes as needed for chest pain. (Patient taking differently: Place 0.4 mg under the tongue every 5 (five) minutes as needed for chest pain. Patient has not used in 10 years)  . PRALUENT 75 MG/ML SOAJ INJECT 1 PEN INTO THE SKIN EVERY 14 DAYS  . tiZANidine (ZANAFLEX) 2 MG tablet TAKE 1 TO 2 TABLETS(2 TO 4 MG) BY MOUTH TWICE DAILY AS NEEDED FOR MUSCLE SPASMS  . traZODone (DESYREL) 50 MG tablet Take 0.5-1 tablets (25-50 mg total) by mouth at bedtime as needed for sleep.   No facility-administered encounter medications on file as of 12/15/2020.    Allergies (verified) Questran [cholestyramine], Atorvastatin, Gemfibrozil, Metformin and related, and Rosuvastatin   History: Past Medical History:  Diagnosis Date  . Anosmia 1980s  . Atrial flutter ([place]) 2018   had an ablation with dr. Erick Alley  . CAD (coronary artery disease)   . Cancer ([place])    skin cancer  . Chronic constipation   . Chronic insomnia   . Chronic lower back pain    s/p spine stimulator placement  . Complication of anesthesia    when under general, he woke up agitated and unable  to be held down  . Diastolic CHF ([place])   . History of diverticulitis 2017  . History of pneumonia 2013  . Hyperlipidemia   . Hypertension   . Hypothyroidism   . Obesity, Class II, BMI 35-39.9, with comorbidity   . OSA on CPAP   . PONV (postoperative nausea and vomiting)    Past Surgical History:  Procedure Laterality Date  . A-FLUTTER ABLATION N/A 05/07/2017   Procedure: A-Flutter Ablation;  Surgeon: Deboraha Sprang, MD;  Location: [facility];  Service: Cardiovascular;  Laterality: N/A;  . CARDIOVASCULAR STRESS TEST  11/2017   no ischemia, low risk study  . COLONOSCOPY  2007   normal per prior PCP records, rpt 10 yrs (Dr Osie Cheeks)  . ELBOW SURGERY Right    ulnar nerve decompression.  PT DOES NOT RECALL THIS PROCEDURE  . ENDOVASCULAR REPAIR/STENT GRAFT N/A 05/05/2020   Procedure: ENDOVASCULAR REPAIR/STENT GRAFT;  Surgeon: Algernon Huxley, MD;  [facility]  INVASIVE CV LAB;  Service: Cardiovascular;  Laterality: N/A;  . LAMINECTOMY THORACIC SPINE W/ PLACEMENT SPINAL CORD STIMULATOR  08/2018   and removal of prior spine stimulator (Dr Lacinda Axon)  . NASAL SINUS SURGERY     nasal polyps. done a long time ago  . PERCUTANEOUS CORONARY STENT INTERVENTION (PCI-S)  2013   EF55%, 70% mid LAD, 99% mid RCA, mild MR, elev LVEDP, DES to mid LAD. RCA is nondominant  . RADIOFREQUENCY ABLATION  06/2017   lumbar region [facility])   . REPLACEMENT TOTAL KNEE BILATERAL Bilateral 2000s  . SPINAL CORD STIMULATOR IMPLANT  06/2016  . SPINAL CORD STIMULATOR INSERTION N/A 09/09/2018   Procedure: LUMBAR SPINAL CORD STIMULATOR LEAD AND BATTERY REMOVAL AND LAMINECTOMY FOR PLACEMENT OF PADDLE;  Surgeon: Deetta Perla, MD;  Location: [facility] ORS;  Service: Neurosurgery;  Laterality: N/A;  . SPINAL CORD STIMULATOR INSERTION N/A 09/16/2018   Procedure: PLACEMENT OF SPINAL CORD STIMULATOR BATTERY;  Surgeon: Deetta Perla, MD;  Location: [facility] ORS;  Service: Neurosurgery;  Laterality: N/A;   Family History  Problem Relation  Age of Onset  . Stroke Mother   . Kidney disease Father   . Leukemia Brother   . Stroke Sister   . Diabetes Neg Hx    Social History   Socioeconomic History  . Marital status: Widowed    Spouse name: Not on file  . Number of children: Not on file  . Years of education: Not on file  . Highest education level: Not on file  Occupational History  . Not on file  Tobacco Use  . Smoking status: Never Smoker  . Smokeless tobacco: Never Used  Vaping Use  . Vaping Use: Never used  Substance and Sexual Activity  . Alcohol use: No    Comment: QUIT DRINKING  . Drug use: No  . Sexual activity: Not Currently  Other Topics Concern  . Not on file  Social History Narrative   Widower (wife passed 08/2014).    Lives alone, GF Miss [name] sometimes (she was nurse)   Son is [name] (FP at [employer]). Other son is a Psychologist, sport and exercise in [place]: retired, used to [employer] store   Edu: [employer]   Social Determinants of Radio broadcast assistant Strain: [place]   . Difficulty of Paying Living Expenses: Not hard at all  Food Insecurity: No Food Insecurity  . Worried About Charity fundraiser in the Last Year: Never true  . Ran Out of Food in the Last Year: Never true  Transportation Needs: No Transportation Needs  . Lack of Transportation (Medical): No  . Lack of Transportation (Non-Medical): No  Physical Activity: Sufficiently Active  . Days of Exercise per Week: 7 days  . Minutes of Exercise per Session: 60 min  Stress: No Stress Concern Present  . Feeling of Stress : Not at all  Social Connections: Not on file    Tobacco Counseling Counseling given: Not Answered   Clinical Intake:  Pre-visit preparation completed: Yes  Pain : 0-10 Pain Type: Chronic pain Pain Location: Back Pain Orientation: Lower Pain Descriptors / Indicators: Aching Pain Onset: More than a month ago Pain Frequency: Intermittent     Nutritional Risks: None Diabetes: No  How often  do you need to have someone help you when you read instructions, pamphlets, or other written materials from your doctor or pharmacy?: 1 - Never  Diabetic: No Nutrition Risk Assessment:  Has the patient had any  N/V/D within the last 2 months?  No  Does the patient have any non-healing wounds?  No  Has the patient had any unintentional weight loss or weight gain?  No   Diabetes:  Is the patient diabetic?  No  If diabetic, was a CBG obtained today?  N/A Did the patient bring in their glucometer from home?  N/A How often do you monitor your CBG's? N/A.   Financial Strains and Diabetes Management:  Are you having any financial strains with the device, your supplies or your medication? N/A.  Does the patient want to be seen by Chronic Care Management for management of their diabetes?  N/A Would the patient like to be referred to a Nutritionist or for Diabetic Management?  N/A Interpreter Needed?: No  Information entered by :: CJohnson, LPN   Activities of Daily Living In your present state of health, do you have any difficulty performing the following activities: 12/15/2020 05/05/2020  Hearing? N -  Vision? N -  Difficulty concentrating or making decisions? N -  Walking or climbing stairs? N -  Dressing or bathing? N -  Doing errands, shopping? N N  Preparing Food and eating ? N -  Using the Toilet? N -  In the past six months, have you accidently leaked urine? N -  Do you have problems with loss of bowel control? N -  Managing your Medications? N -  Managing your Finances? N -  Housekeeping or managing your Housekeeping? N -  Some recent data might be hidden    Patient Care Team: Ria Bush, MD as PCP - General (Family Medicine) Gillis Santa, MD as Consulting Physician (Pain Medicine) Deboraha Sprang, MD as Consulting Physician (Cardiology) Debbora Dus, [facility] as Pharmacist (Pharmacist)  Indicate any recent Medical Services you may have received from other than [facility]  providers in the past year (date may be approximate).     Assessment:   This is a routine wellness examination for [place].  Hearing/Vision screen  Hearing Screening   125Hz  250Hz  500Hz  1000Hz  2000Hz  3000Hz  4000Hz  6000Hz  8000Hz   Right ear:           Left ear:           Vision Screening Comments: Advised patient to get annual eye exams.   Dietary issues and exercise activities discussed: Current Exercise Habits: Home exercise routine, Type of exercise: walking, Time (Minutes): 60, Frequency (Times/Week): 7, Weekly Exercise (Minutes/Week): 420, Intensity: Moderate, Exercise limited by: None identified  Goals    . Increase physical activity     Starting 10/24/2018, I will continue to exercise for at least 45 minutes daily.     . Patient Stated     10/30/2019, I will continue to walk for 1 hour everyday.     . Patient Stated     12/15/2020, I will continue to walk 4 miles everyday.     Marland Kitchen Pharmacy Care Plan     CARE PLAN ENTRY (see longitudinal plan of care for additional care plan information)  Current Barriers:  . Chronic Disease Management support, education, and care coordination needs related to Hypertension and Hyperlipidemia   Hypertension BP Readings from Last 3 Encounters:  06/30/20 118/78  06/03/20 124/80  05/06/20 (!) 146/84 .  Pharmacist Clinical Goal(s): o Over the next 6 months, patient will work with PharmD and providers to achieve BP goal <130/80 . Current regimen:   Lisinopril 10 mg - 1 tablet daily  Amlodipine 5 mg - 1 tablet daily .  Interventions: o Reviewed home BP monitoring . Patient self care activities - Over the next 6 months, patient will: o Check BP once every twice weekly, document, and provide at future appointments o Continue daily walking  o Ensure daily salt intake < 2300 mg/day  Hyperlipidemia Lab Results  Component Value Date/Time   LDLCALC 24 10/06/2019 10:31 AM   LDLDIRECT 117.0 10/24/2018 11:15 AM   . Pharmacist Clinical  Goal(s): o Over the next 6 months, patient will work with PharmD and providers to maintain LDL goal < 70 . Current regimen:   Praluent 75 mg/mL - Inject every 14 days  Ezetimibe 10 mg - 1 tablet daily  Fenofibrate 160mg  - 1 tablet daily  Icosapent Ethyl (Vascepa) 1 gm - 2 capsules BID . Interventions: o Recommend trial off fenofibrate  . Patient self care activities - Over the next 6 months, patient will: o Continue current medications until further contact from clinic  Please see past updates related to this goal by clicking on the "Past Updates" button in the selected goal       Depression Screen PHQ 2/9 Scores 12/15/2020 10/30/2019 10/24/2018 02/07/2018 12/27/2017 10/18/2017 10/11/2017  PHQ - 2 Score 0 0 0 0 0 0 0  PHQ- 9 Score 0 0 0 - - - 3    Fall Risk Fall Risk  12/15/2020 10/30/2019 10/24/2018 02/07/2018 12/27/2017  Falls in the past year? 0 0 0 No No  Number falls in past yr: 0 0 - - -  Injury with Fall? 0 0 - - -  Risk for fall due to : Medication side effect Medication side effect - - -  Follow up Falls evaluation completed;Falls prevention discussed Falls evaluation completed;Falls prevention discussed - - -    FALL RISK PREVENTION PERTAINING TO THE HOME:  Any stairs in or around the home? Yes  If so, are there any without handrails? No  Home free of loose throw rugs in walkways, pet beds, electrical cords, etc? Yes  Adequate lighting in your home to reduce risk of falls? Yes   ASSISTIVE DEVICES UTILIZED TO PREVENT FALLS:  Life alert? No  Use of a cane, walker or w/c? No  Grab bars in the bathroom? Yes  Shower chair or bench in shower? Yes  Elevated toilet seat or a handicapped toilet? No   TIMED UP AND GO:  Was the test performed? N/A telephone visit .   Cognitive Function: MMSE - Mini Mental State Exam 12/15/2020 10/30/2019 10/24/2018 10/11/2017  Not completed: Refused - - -  Orientation to time - 5 5 5   Orientation to Place - 5 5 5   Registration - 3 3 3   Attention/  Calculation - 5 0 0  Recall - 3 1 3   Recall-comments - - unable to recall 2 of 3 words -  Language- name 2 objects - - 0 0  Language- repeat - 1 1 1   Language- follow 3 step command - - 2 3  Language- follow 3 step command-comments - - unable to follow 1 step of 3 step command -  Language- read & follow direction - - 0 0  Write a sentence - - 0 0  Copy design - - 0 0  Total score - - 17 20  Mini Cog  Mini-Cog screen was not completed. Patient refused. Maximum score is 22. A value of 0 denotes this part of the MMSE was not completed or the patient failed this part of the Mini-Cog screening.  Immunizations Immunization History  Administered Date(s) Administered  . Fluad Quad(high Dose 65+) 05/07/2019, 06/30/2020  . Hepatitis A, Adult 12/12/1996, 06/30/1997  . Influenza Split 07/16/2006, 06/16/2008, 05/17/2009, 07/04/2010, 07/05/2011, 06/18/2012  . Influenza, High Dose Seasonal PF 06/03/2015, 05/18/2016  . Influenza,inj,Quad PF,6+ Mos 06/04/2013, 04/22/2014, 07/24/2017, 05/23/2018  . PFIZER(Purple Top)SARS-COV-2 Vaccination 09/24/2019, 10/15/2019, 07/05/2020  . Pneumococcal Conjugate-13 10/20/2013  . Pneumococcal Polysaccharide-23 02/17/2011, 10/09/2011  . Pneumococcal-Unspecified 08/28/2001  . Td 08/28/2001, 10/09/2011  . Zoster 08/28/2013    TDAP status: Up to date  Flu Vaccine status: Up to date  Pneumococcal vaccine status: Up to date  Covid-19 vaccine status: Completed vaccines  Qualifies for Shingles Vaccine? Yes   Zostavax completed Yes   Shingrix Completed?: No.    Education has been provided regarding the importance of this vaccine. Patient has been advised to call insurance company to determine out of pocket expense if they have not yet received this vaccine. Advised may also receive vaccine at local pharmacy or Health Dept. Verbalized acceptance and understanding.  Screening Tests Health Maintenance  Topic Date Due  . COVID-19 Vaccine (4 - Booster for  Pfizer series) 01/02/2021  . INFLUENZA VACCINE  03/28/2021  . TETANUS/TDAP  10/08/2021  . PNA vac Low Risk Adult  Completed  . HPV VACCINES  Aged Out    Health Maintenance  There are no preventive care reminders to display for this patient.  Colorectal cancer screening: No longer required.   Lung Cancer Screening: (Low Dose CT Chest recommended if Age 54-80 years, 30 pack-year currently smoking OR have quit w/in 15 years.) does not qualify.   Additional Screening:  Hepatitis C Screening: does not qualify; Completed N/A  Vision Screening: Recommended annual ophthalmology exams for early detection of glaucoma and other disorders of the eye. Is the patient up to date with their annual eye exam?  No  Who is the provider or what is the name of the office in which the patient attends annual eye exams? Does not have one currently If pt is not established with a provider, would they like to be referred to a provider to establish care? No .   Dental Screening: Recommended annual dental exams for proper oral hygiene  Community Resource Referral / Chronic Care Management: CRR required this visit?  No   CCM required this visit?  No      Plan:     I have personally reviewed and noted the following in the patient's chart:   . Medical and social history . Use of alcohol, tobacco or illicit drugs  . Current medications and supplements . Functional ability and status . Nutritional status . Physical activity . Advanced directives . List of other physicians . Hospitalizations, surgeries, and ER visits in previous 12 months . Vitals . Screenings to include cognitive, depression, and falls . Referrals and appointments  In addition, I have reviewed and discussed with patient certain preventive protocols, quality metrics, and best practice recommendations. A written personalized care plan for preventive services as well as general preventive health recommendations were provided to  patient.   Due to this being a telephonic visit, the after visit summary with patients personalized plan was offered to patient via office or my-chart. Patient preferred to pick up at office at next visit or via mychart.   Andrez Grime, LPN   12/19/5359

## 2020-12-16 ENCOUNTER — Other Ambulatory Visit (INDEPENDENT_AMBULATORY_CARE_PROVIDER_SITE_OTHER): Payer: Medicare Other

## 2020-12-16 ENCOUNTER — Other Ambulatory Visit: Payer: Self-pay

## 2020-12-16 DIAGNOSIS — N1832 Chronic kidney disease, stage 3b: Secondary | ICD-10-CM

## 2020-12-16 DIAGNOSIS — E039 Hypothyroidism, unspecified: Secondary | ICD-10-CM | POA: Diagnosis not present

## 2020-12-16 DIAGNOSIS — E782 Mixed hyperlipidemia: Secondary | ICD-10-CM | POA: Diagnosis not present

## 2020-12-16 LAB — CBC WITH DIFFERENTIAL/PLATELET
Basophils Absolute: 0.2 10*3/uL — ABNORMAL HIGH (ref 0.0–0.1)
Basophils Relative: 2.9 % (ref 0.0–3.0)
Eosinophils Absolute: 0.9 10*3/uL — ABNORMAL HIGH (ref 0.0–0.7)
Eosinophils Relative: 12 % — ABNORMAL HIGH (ref 0.0–5.0)
HCT: 39.5 % (ref 39.0–52.0)
Hemoglobin: 13.2 g/dL (ref 13.0–17.0)
Lymphocytes Relative: 37.2 % (ref 12.0–46.0)
Lymphs Abs: 2.8 10*3/uL (ref 0.7–4.0)
MCHC: 33.4 g/dL (ref 30.0–36.0)
MCV: 95.2 fl (ref 78.0–100.0)
Monocytes Absolute: 0.6 10*3/uL (ref 0.1–1.0)
Monocytes Relative: 8.3 % (ref 3.0–12.0)
Neutro Abs: 3 10*3/uL (ref 1.4–7.7)
Neutrophils Relative %: 39.6 % — ABNORMAL LOW (ref 43.0–77.0)
Platelets: 276 10*3/uL (ref 150.0–400.0)
RBC: 4.15 Mil/uL — ABNORMAL LOW (ref 4.22–5.81)
RDW: 14.7 % (ref 11.5–15.5)
WBC: 7.6 10*3/uL (ref 4.0–10.5)

## 2020-12-16 LAB — COMPREHENSIVE METABOLIC PANEL
ALT: 21 U/L (ref 0–53)
AST: 22 U/L (ref 0–37)
Albumin: 4.1 g/dL (ref 3.5–5.2)
Alkaline Phosphatase: 61 U/L (ref 39–117)
BUN: 25 mg/dL — ABNORMAL HIGH (ref 6–23)
CO2: 29 mEq/L (ref 19–32)
Calcium: 10.7 mg/dL — ABNORMAL HIGH (ref 8.4–10.5)
Chloride: 105 mEq/L (ref 96–112)
Creatinine, Ser: 1.24 mg/dL (ref 0.40–1.50)
GFR: 53.89 mL/min — ABNORMAL LOW (ref >60.00)
Glucose, Bld: 112 mg/dL — ABNORMAL HIGH (ref 70–99)
Potassium: 4.3 mEq/L (ref 3.5–5.1)
Sodium: 140 mEq/L (ref 135–145)
Total Bilirubin: 0.6 mg/dL (ref 0.2–1.2)
Total Protein: 7.2 g/dL (ref 6.0–8.3)

## 2020-12-16 LAB — LIPID PANEL
Cholesterol: 120 mg/dL (ref 0–200)
HDL: 40.3 mg/dL (ref >39.00)
NonHDL: 79.57
Total CHOL/HDL Ratio: 3
Triglycerides: 236 mg/dL — ABNORMAL HIGH (ref 0.0–149.0)
VLDL: 47.2 mg/dL — ABNORMAL HIGH (ref 0.0–40.0)

## 2020-12-16 LAB — TSH: TSH: 0.27 u[IU]/mL — ABNORMAL LOW (ref 0.35–4.50)

## 2020-12-16 LAB — VITAMIN D 25 HYDROXY (VIT D DEFICIENCY, FRACTURES): VITD: 37.51 ng/mL (ref 30.00–100.00)

## 2020-12-16 LAB — LDL CHOLESTEROL, DIRECT: Direct LDL: 42 mg/dL

## 2020-12-22 ENCOUNTER — Other Ambulatory Visit: Payer: Self-pay

## 2020-12-22 ENCOUNTER — Encounter: Payer: Self-pay | Admitting: Family Medicine

## 2020-12-22 ENCOUNTER — Ambulatory Visit (INDEPENDENT_AMBULATORY_CARE_PROVIDER_SITE_OTHER): Payer: Medicare Other | Admitting: Family Medicine

## 2020-12-22 VITALS — BP 134/84 | HR 93 | Temp 97.7°F | Ht 71.5 in | Wt 274.2 lb

## 2020-12-22 DIAGNOSIS — E559 Vitamin D deficiency, unspecified: Secondary | ICD-10-CM

## 2020-12-22 DIAGNOSIS — I714 Abdominal aortic aneurysm, without rupture, unspecified: Secondary | ICD-10-CM

## 2020-12-22 DIAGNOSIS — E782 Mixed hyperlipidemia: Secondary | ICD-10-CM | POA: Diagnosis not present

## 2020-12-22 DIAGNOSIS — E039 Hypothyroidism, unspecified: Secondary | ICD-10-CM | POA: Diagnosis not present

## 2020-12-22 DIAGNOSIS — G4733 Obstructive sleep apnea (adult) (pediatric): Secondary | ICD-10-CM

## 2020-12-22 DIAGNOSIS — N400 Enlarged prostate without lower urinary tract symptoms: Secondary | ICD-10-CM

## 2020-12-22 DIAGNOSIS — F5104 Psychophysiologic insomnia: Secondary | ICD-10-CM | POA: Diagnosis not present

## 2020-12-22 DIAGNOSIS — Z9689 Presence of other specified functional implants: Secondary | ICD-10-CM

## 2020-12-22 DIAGNOSIS — I10 Essential (primary) hypertension: Secondary | ICD-10-CM | POA: Diagnosis not present

## 2020-12-22 DIAGNOSIS — Z7189 Other specified counseling: Secondary | ICD-10-CM | POA: Diagnosis not present

## 2020-12-22 DIAGNOSIS — N1831 Chronic kidney disease, stage 3a: Secondary | ICD-10-CM

## 2020-12-22 DIAGNOSIS — I5030 Unspecified diastolic (congestive) heart failure: Secondary | ICD-10-CM

## 2020-12-22 DIAGNOSIS — Z8679 Personal history of other diseases of the circulatory system: Secondary | ICD-10-CM

## 2020-12-22 DIAGNOSIS — K5909 Other constipation: Secondary | ICD-10-CM | POA: Diagnosis not present

## 2020-12-22 DIAGNOSIS — D721 Eosinophilia, unspecified: Secondary | ICD-10-CM

## 2020-12-22 DIAGNOSIS — F3340 Major depressive disorder, recurrent, in remission, unspecified: Secondary | ICD-10-CM

## 2020-12-22 DIAGNOSIS — Z9889 Other specified postprocedural states: Secondary | ICD-10-CM

## 2020-12-22 DIAGNOSIS — G8929 Other chronic pain: Secondary | ICD-10-CM

## 2020-12-22 DIAGNOSIS — Z9989 Dependence on other enabling machines and devices: Secondary | ICD-10-CM

## 2020-12-22 LAB — POC URINALSYSI DIPSTICK (AUTOMATED)
Bilirubin, UA: NEGATIVE
Blood, UA: NEGATIVE
Glucose, UA: NEGATIVE
Ketones, UA: NEGATIVE
Nitrite, UA: NEGATIVE
Protein, UA: NEGATIVE
Spec Grav, UA: >=1.03 — AB (ref 1.010–1.025)
Urobilinogen, UA: 0.2 E.U./dL
pH, UA: 5.5 (ref 5.0–8.0)

## 2020-12-22 MED ORDER — LEVOTHYROXINE SODIUM 150 MCG PO TABS: 150.0000 ug | ORAL_TABLET | Freq: Every day | ORAL | 3 refills | Status: DC

## 2020-12-22 NOTE — Progress Notes (Signed)
Patient ID: Anthony [name], male    DOB: 05-13-1936, 85 y.o.   MRN: [id number]  This visit was conducted in person.  BP 134/84   Pulse 93   Temp 97.7 F (36.5 C) (Temporal)   Ht 5' 11.5" (1.816 m)   Wt 274 lb 4 oz (124.4 kg)   SpO2 96%   BMI 37.72 kg/m    CC: CPE  Subjective:   HPI: Anthony [name] is a 85 y.o. male presenting on 12/22/2020 for Annual Exam (Prt 2. )   Saw health advisor last week for medicare wellness visit. Note reviewed.   No exam data present  Flowsheet Row Clinical Support from 12/15/2020 in [facility]  PHQ-2 Total Score 0      Fall Risk  12/15/2020 10/30/2019 10/24/2018 02/07/2018 12/27/2017  Falls in the past year? 0 0 0 No No  Number falls in past yr: 0 0 - - -  Injury with Fall? 0 0 - - -  Risk for fall due to : Medication side effect Medication side effect - - -  Follow up Falls evaluation completed;Falls prevention discussed Falls evaluation completed;Falls prevention discussed - - -    Renal insufficiency - last visit we dropped lisinopril dose. Feels he's staying well hydrated.   Another 9 lb intentional weight loss in the past 2 weeks, total of 20+ lbs in 5 months.   HLD - statin intolerant, on praluent, zetia, vascepa. Fenofibrate recently stopped.   Seeing chiropractor.   Chronic insomnia - on tylenol PM and trazodone with benefit. Lunesta didn't help.   Preventative: Colon cancer screening -normal colonoscopy 2007. Denies bowel changes or blood in stool. No known fmhx colon cancer.Aged out. Denies blood in stool, bowel changes.  Prostate cancer screening -aged out Lung cancer screening -not eligible Flu shot - yearly [place] 08/2019, 09/2019, booster 06/2020, declines 2nd booster Td 2013 Pneumovax 2012, 2013, prevnar 2015 zostavax 2015  shingrix - discussed Advanced directive discussion -sons would be HCPOA. Doesn't have living will. Packet provided today.  Seat belt use discussed Sunscreen  use discussed, no changing moles on skin. Sees derm Non smoker Alcohol - none  Doesn't see dentist - full implants Eye exam due  Bowel - constipation - well managed with daily linzess Bladder - no incontinence   Widower (wife passed 08/2014) Lives alone, GF Miss [name] sometimes (she was Marine scientist) Son nearby [place]: retired, used to [employer] store Edu: HS Activity:tries to do exercises at home - stationary bike Diet: good water, fruits/vegetables daily     Relevant past medical, surgical, family and social history reviewed and updated as indicated. Interim medical history since our last visit reviewed. Allergies and medications reviewed and updated. Outpatient Medications Prior to Visit  Medication Sig Dispense Refill  . acetaminophen (TYLENOL) 500 MG tablet Take 1 tablet (500 mg total) by mouth 2 (two) times daily as needed for moderate pain.    Marland Kitchen amLODipine (NORVASC) 5 MG tablet TAKE 1 TABLET(5 MG) BY MOUTH DAILY 30 tablet 2  . aspirin EC 81 MG tablet Take 1 tablet (81 mg total) by mouth daily.    . Cholecalciferol (VITAMIN D3) 1000 units CAPS Take 1 capsule (1,000 Units total) by mouth daily. 30 capsule   . clopidogrel (PLAVIX) 75 MG tablet Take 1 tablet (75 mg total) by mouth daily at 6 (six) AM. 90 tablet 3  . diphenhydramine-acetaminophen (TYLENOL PM) 25-500 MG TABS tablet Take 1 tablet by mouth at  bedtime.    . docusate sodium (COLACE) 100 MG capsule Take 100 mg by mouth daily as needed.    . ezetimibe (ZETIA) 10 MG tablet TAKE 1 TABLET(10 MG) BY MOUTH DAILY 90 tablet 3  . finasteride (PROSCAR) 5 MG tablet TAKE 1 TABLET(5 MG) BY MOUTH DAILY 90 tablet 0  . fluocinonide (LIDEX) 0.05 % external solution Apply 1 application topically 2 (two) times daily as needed. (Patient taking differently: Apply 1 application topically as needed.) 60 mL 2  . furosemide (LASIX) 40 MG tablet TAKE 1 TABLET BY MOUTH EVERY DAY. TAKE AN EXTRA TABLET AS NEEDED FOR WEIGHT GAIN OF 3 LBS/DAY OR 5LBS/WEEK  135 tablet 3  . guaiFENesin-codeine (CHERATUSSIN AC) 100-10 MG/5ML syrup Take 5 mLs by mouth 2 (two) times daily as needed for cough (do not mix with hydrocodone). 120 mL 0  . HYDROcodone-acetaminophen (NORCO) 10-325 MG tablet Take 1 tablet by mouth every 8 (eight) hours as needed. 20 tablet 0  . icosapent Ethyl (VASCEPA) 1 g capsule Take 2 capsules (2 g total) by mouth 2 (two) times daily. 120 capsule 11  . ketoconazole (NIZORAL) 2 % cream Apply to the feet and face QHS. 60 g 3  . ketoconazole (NIZORAL) 2 % shampoo APPLY TOPICALLY 3 TIMES WEEKLY AS DIRECTED 120 mL 2  . LINZESS 290 MCG CAPS capsule TAKE 1 CAPSULE(290 MCG) BY MOUTH DAILY 30 MINUTES BEFORE FIRST MEAL OF THE DAY ON AN EMPTY STOMACH 90 capsule 1  . lisinopril (ZESTRIL) 10 MG tablet Take 1 tablet (10 mg total) by mouth daily. 90 tablet 1  . Melatonin 10 MG CAPS Take by mouth.    . mometasone (ELOCON) 0.1 % lotion Apply to scalp QHS on Monday, Wednesday, and Friday. 60 mL 3  . Multiple Vitamins-Minerals (CENTRUM SILVER PO) Take 1 tablet by mouth daily.    . Multiple Vitamins-Minerals (PRESERVISION AREDS 2 PO) Take 1 tablet by mouth daily.    . nitroGLYCERIN (NITROSTAT) 0.4 MG SL tablet Place 1 tablet (0.4 mg total) under the tongue every 5 (five) minutes as needed for chest pain. (Patient taking differently: Place 0.4 mg under the tongue every 5 (five) minutes as needed for chest pain. Patient has not used in 10 years) 25 tablet 0  . PRALUENT 75 MG/ML SOAJ INJECT 1 PEN INTO THE SKIN EVERY 14 DAYS 2 mL 9  . tiZANidine (ZANAFLEX) 2 MG tablet TAKE 1 TO 2 TABLETS(2 TO 4 MG) BY MOUTH TWICE DAILY AS NEEDED FOR MUSCLE SPASMS 30 tablet 0  . traZODone (DESYREL) 50 MG tablet Take 0.5-1 tablets (25-50 mg total) by mouth at bedtime as needed for sleep. 30 tablet 3  . eszopiclone (LUNESTA) 1 MG TABS tablet Take 1 tablet (1 mg total) by mouth at bedtime as needed for sleep. Use sparingly. Take immediately before bedtime 30 tablet 0  . fenofibrate 160  MG tablet TAKE 1 TABLET BY MOUTH EVERY DAY 90 tablet 3  . gabapentin (NEURONTIN) 300 MG capsule TAKE 2 CAPSULES BY MOUTH AT BEDTIME. WITH EXTRA CAPSULE DURING THE DAY AS NEEDED 200 capsule 1  . ketoconazole (NIZORAL) 2 % cream Apply 1 application topically daily as needed (for dermatitits).     Marland Kitchen. levothyroxine (SYNTHROID) 175 MCG tablet TAKE 1 TABLET(175 MCG) BY MOUTH DAILY BEFORE BREAKFAST 90 tablet 3  . gabapentin (NEURONTIN) 300 MG capsule Take 1 capsule (300 mg total) by mouth at bedtime. With extra if needed     No facility-administered medications prior to visit.  Per HPI unless specifically indicated in ROS section below Review of Systems Objective:  BP 134/84   Pulse 93   Temp 97.7 F (36.5 C) (Temporal)   Ht 5' 11.5" (1.816 m)   Wt 274 lb 4 oz (124.4 kg)   SpO2 96%   BMI 37.72 kg/m   Wt Readings from Last 3 Encounters:  12/22/20 274 lb 4 oz (124.4 kg)  12/01/20 279 lb (126.6 kg)  10/06/20 283 lb 5 oz (128.5 kg)      Physical Exam Vitals and nursing note reviewed.  Constitutional:      General: He is not in acute distress.    Appearance: Normal appearance. He is well-developed. He is not ill-appearing.  HENT:     Head: Normocephalic and atraumatic.     Right Ear: Hearing, tympanic membrane, ear canal and external ear normal.     Left Ear: Hearing, tympanic membrane, ear canal and external ear normal.  Eyes:     General: No scleral icterus.    Extraocular Movements: Extraocular movements intact.     Conjunctiva/sclera: Conjunctivae normal.     Pupils: Pupils are equal, round, and reactive to light.  Neck:     Thyroid: No thyroid mass or thyromegaly.  Cardiovascular:     Rate and Rhythm: Normal rate and regular rhythm.     Pulses: Normal pulses.          Radial pulses are 2+ on the right side and 2+ on the left side.     Heart sounds: Normal heart sounds. No murmur heard.   Pulmonary:     Effort: Pulmonary effort is normal. No respiratory distress.      Breath sounds: Normal breath sounds. No wheezing, rhonchi or rales.  Abdominal:     General: Bowel sounds are normal. There is no distension.     Palpations: Abdomen is soft. There is no mass.     Tenderness: There is no abdominal tenderness. There is no guarding or rebound.     Hernia: No hernia is present.  Musculoskeletal:        General: Normal range of motion.     Cervical back: Normal range of motion and neck supple.     Right lower leg: No edema.     Left lower leg: No edema.  Lymphadenopathy:     Cervical: No cervical adenopathy.  Skin:    General: Skin is warm and dry.     Findings: No rash.  Neurological:     General: No focal deficit present.     Mental Status: He is alert and oriented to person, place, and time.     Comments: CN grossly intact, station and gait intact  Psychiatric:        Mood and Affect: Mood normal.        Behavior: Behavior normal.        Thought Content: Thought content normal.        Judgment: Judgment normal.       Results for orders placed or performed in visit on 12/22/20  POCT Urinalysis Dipstick (Automated)  Result Value Ref Range   Color, UA yellow    Clarity, UA clear    Glucose, UA Negative Negative   Bilirubin, UA negative    Ketones, UA negative    Spec Grav, UA >=1.030 (A) 1.010 - 1.025   Blood, UA negative    pH, UA 5.5 5.0 - 8.0   Protein, UA Negative Negative   Urobilinogen, UA 0.2 0.2  or 1.0 E.U./dL   Nitrite, UA negative    Leukocytes, UA Trace (A) Negative   Depression screen [facility] 2/9 12/15/2020 10/30/2019 10/24/2018 02/07/2018 12/27/2017  Decreased Interest 0 0 0 0 0  Down, Depressed, Hopeless 0 0 0 0 0  PHQ - 2 Score 0 0 0 0 0  Altered sleeping 0 0 0 - -  Tired, decreased energy 0 0 0 - -  Change in appetite 0 0 0 - -  Feeling bad or failure about yourself  0 0 0 - -  Trouble concentrating 0 0 0 - -  Moving slowly or fidgety/restless 0 0 0 - -  Suicidal thoughts 0 0 0 - -  PHQ-9 Score 0 0 0 - -  Difficult doing  work/chores Not difficult at all Not difficult at all Not difficult at all - -    Assessment & Plan:  This visit occurred during the SARS-CoV-2 public health emergency.  Safety protocols were in place, including screening questions prior to the visit, additional usage of staff PPE, and extensive cleaning of exam room while observing appropriate contact time as indicated for disinfecting solutions.   Problem List Items Addressed This Visit    Hypercholesterolemia with hypertriglyceridemia    Statin intolerance. Continue praluent with zetia and vascepa. The ASCVD Risk score Mikey Bussing DC Jr., et al., 2013) failed to calculate for the following reasons:   The 2013 ASCVD risk score is only valid for ages 21 to 18       Chronic constipation    Continue linzess. Discussed dosing.       OSA on CPAP    Continue CPAP nightly.       BPH (benign prostatic hyperplasia)    Continue finasteride, now off flomax.       Chronic insomnia    Continue trazodone and tylenol PM.  Now off lunesta (filled earlier in the month)      Diastolic heart failure (HCC)   Essential hypertension    Chronic, stable. Continue current regimen.       Relevant Orders   POCT Urinalysis Dipstick (Automated) (Completed)   Hypothyroidism    Over-treated - drop levothyroxine to 110mcg daily, reassess at f/u visit.       Relevant Medications   levothyroxine (SYNTHROID) 150 MCG tablet   Other Relevant Orders   TSH   Severe obesity (BMI 35.0-39.9) with comorbidity ([place])    Ongoing weight loss noted - intentional.       CKD (chronic kidney disease) stage 3, GFR 30-59 ml/min (HCC)    Kidney function has improved - continue current regimen, ensure good hydration status. Check UA.       Relevant Orders   Basic metabolic panel   S/P insertion of spinal cord stimulator   Vitamin D deficiency    Continue replacement.       Hypercalcemia    Update calcium once thyroid levels have returned to normal.        Advanced care planning/counseling discussion - Primary    Advanced directive discussion -sons would be HCPOA. Doesn't have living will. Packet provided today       Abdominal aortic aneurysm (AAA) without rupture ([place])    S/p endovascular repair 2021      Recurrent major depression in remission ([place])    Stable period off medication.       Encounter for chronic pain management    [place] CSRS reviewed.       Eosinophilia    Mild, persistent  eosinophilia over the years with normal white count. Will refer to heme for eval.  periph smear stable 06/2020      Relevant Orders   Ambulatory referral to Hematology / Oncology   S/P endovascular aneurysm repair       Meds ordered this encounter  Medications  . levothyroxine (SYNTHROID) 150 MCG tablet    Sig: Take 1 tablet (150 mcg total) by mouth daily before breakfast.    Dispense:  90 tablet    Refill:  3    Note new dose   Orders Placed This Encounter  Procedures  . TSH    Standing Status:   Future    Standing Expiration Date:   12/27/2021  . Basic metabolic panel    Standing Status:   Future    Standing Expiration Date:   12/27/2021  . Ambulatory referral to Hematology / Oncology    Referral Priority:   Routine    Referral Type:   Consultation    Referral Reason:   Specialty Services Required    Requested Specialty:   Oncology    Number of Visits Requested:   1  . POCT Urinalysis Dipstick (Automated)    Patient instructions: Urinalysis today  Limit linzess to 1 a day. Decrease levothyroxine dose to 160mcg daily - new dose sent to pharmacy. Schedule lab visit in 2 months to recheck thyroid levels.  Eosinophils staying elevated - will refer to blood doctor for evaluation.  You are doing well today Return as needed or in 3-4 months for follow up visit.   Follow up plan: Return in about 3 months (around 03/23/2021) for follow up visit.  Ria Bush, MD

## 2020-12-22 NOTE — Patient Instructions (Addendum)
Urinalysis today  Limit linzess to 1 a day. Decrease levothyroxine dose to 178mcg daily - new dose sent to pharmacy. Schedule lab visit in 2 months to recheck thyroid levels.  Eosinophils staying elevated - will refer to blood doctor for evaluation.  You are doing well today Return as needed or in 3-4 months for follow up visit.   Health Maintenance After Age 83 After age 68, you are at a higher risk for certain long-term diseases and infections as well as injuries from falls. Falls are a major cause of broken bones and head injuries in people who are older than age 42. Getting regular preventive care can help to keep you healthy and well. Preventive care includes getting regular testing and making lifestyle changes as recommended by your health care provider. Talk with your health care provider about:  Which screenings and tests you should have. A screening is a test that checks for a disease when you have no symptoms.  A diet and exercise plan that is right for you. What should I know about screenings and tests to prevent falls? Screening and testing are the best ways to find a health problem early. Early diagnosis and treatment give you the best chance of managing medical conditions that are common after age 14. Certain conditions and lifestyle choices may make you more likely to have a fall. Your health care provider may recommend:  Regular vision checks. Poor vision and conditions such as cataracts can make you more likely to have a fall. If you wear glasses, make sure to get your prescription updated if your vision changes.  Medicine review. Work with your health care provider to regularly review all of the medicines you are taking, including over-the-counter medicines. Ask your health care provider about any side effects that may make you more likely to have a fall. Tell your health care provider if any medicines that you take make you feel dizzy or sleepy.  Osteoporosis screening.  Osteoporosis is a condition that causes the bones to get weaker. This can make the bones weak and cause them to break more easily.  Blood pressure screening. Blood pressure changes and medicines to control blood pressure can make you feel dizzy.  Strength and balance checks. Your health care provider may recommend certain tests to check your strength and balance while standing, walking, or changing positions.  Foot health exam. Foot pain and numbness, as well as not wearing proper footwear, can make you more likely to have a fall.  Depression screening. You may be more likely to have a fall if you have a fear of falling, feel emotionally low, or feel unable to do activities that you used to do.  Alcohol use screening. Using too much alcohol can affect your balance and may make you more likely to have a fall. What actions can I take to lower my risk of falls? General instructions  Talk with your health care provider about your risks for falling. Tell your health care provider if: ? You fall. Be sure to tell your health care provider about all falls, even ones that seem minor. ? You feel dizzy, sleepy, or off-balance.  Take over-the-counter and prescription medicines only as told by your health care provider. These include any supplements.  Eat a healthy diet and maintain a healthy weight. A healthy diet includes low-fat dairy products, low-fat (lean) meats, and fiber from whole grains, beans, and lots of fruits and vegetables. Home safety  Remove any tripping hazards, such as rugs,  cords, and clutter.  Install safety equipment such as grab bars in bathrooms and safety rails on stairs.  Keep rooms and walkways well-lit. Activity  Follow a regular exercise program to stay fit. This will help you maintain your balance. Ask your health care provider what types of exercise are appropriate for you.  If you need a cane or walker, use it as recommended by your health care provider.  Wear  supportive shoes that have nonskid soles.   Lifestyle  Do not drink alcohol if your health care provider tells you not to drink.  If you drink alcohol, limit how much you have: ? 0-1 drink a day for women. ? 0-2 drinks a day for men.  Be aware of how much alcohol is in your drink. In the [place] one drink equals one typical bottle of beer (12 oz), one-half glass of wine (5 oz), or one shot of hard liquor (1 oz).  Do not use any products that contain nicotine or tobacco, such as cigarettes and e-cigarettes. If you need help quitting, ask your health care provider. Summary  Having a healthy lifestyle and getting preventive care can help to protect your health and wellness after age 24.  Screening and testing are the best way to find a health problem early and help you avoid having a fall. Early diagnosis and treatment give you the best chance for managing medical conditions that are more common for people who are older than age 4.  Falls are a major cause of broken bones and head injuries in people who are older than age 40. Take precautions to prevent a fall at home.  Work with your health care provider to learn what changes you can make to improve your health and wellness and to prevent falls. This information is not intended to replace advice given to you by your health care provider. Make sure you discuss any questions you have with your health care provider. Document Revised: 12/05/2018 Document Reviewed: 06/27/2017 Elsevier Patient Education  2021 [employer].

## 2020-12-22 NOTE — Assessment & Plan Note (Signed)
Advanced directive discussion -sons would be HCPOA. Doesn't have living will. Packet provided today  

## 2020-12-27 NOTE — Assessment & Plan Note (Signed)
Stable period off medication.  

## 2020-12-27 NOTE — Assessment & Plan Note (Signed)
[place] CSRS reviewed  ?

## 2020-12-27 NOTE — Assessment & Plan Note (Addendum)
S/p endovascular repair 2021

## 2020-12-27 NOTE — Assessment & Plan Note (Addendum)
Statin intolerance. Continue praluent with zetia and vascepa. The ASCVD Risk score Mikey Bussing DC Jr., et al., 2013) failed to calculate for the following reasons:   The 2013 ASCVD risk score is only valid for ages 2 to 7

## 2020-12-27 NOTE — Assessment & Plan Note (Signed)
Continue replacement 

## 2020-12-27 NOTE — Assessment & Plan Note (Signed)
Chronic, stable. Continue current regimen. 

## 2020-12-27 NOTE — Assessment & Plan Note (Signed)
Update calcium once thyroid levels have returned to normal.

## 2020-12-27 NOTE — Assessment & Plan Note (Addendum)
Continue trazodone and tylenol PM.  Now off lunesta (filled earlier in the month)

## 2020-12-27 NOTE — Assessment & Plan Note (Signed)
Kidney function has improved - continue current regimen, ensure good hydration status. Check UA.

## 2020-12-27 NOTE — Assessment & Plan Note (Signed)
Ongoing weight loss noted - intentional.

## 2020-12-27 NOTE — Assessment & Plan Note (Addendum)
Mild, persistent eosinophilia over the years with normal white count. Will refer to heme for eval.  periph smear stable 06/2020

## 2020-12-27 NOTE — Assessment & Plan Note (Signed)
Continue linzess. Discussed dosing.

## 2020-12-27 NOTE — Assessment & Plan Note (Signed)
Over-treated - drop levothyroxine to 145mcg daily, reassess at f/u visit.

## 2020-12-27 NOTE — Assessment & Plan Note (Signed)
Continue finasteride, now off flomax.

## 2020-12-27 NOTE — Assessment & Plan Note (Signed)
Continue CPAP nightly. °

## 2020-12-31 ENCOUNTER — Encounter: Payer: Self-pay | Admitting: Internal Medicine

## 2020-12-31 ENCOUNTER — Inpatient Hospital Stay: Payer: Medicare Other | Attending: Internal Medicine | Admitting: Internal Medicine

## 2020-12-31 ENCOUNTER — Other Ambulatory Visit: Payer: Self-pay

## 2020-12-31 ENCOUNTER — Inpatient Hospital Stay: Payer: Medicare Other

## 2020-12-31 VITALS — BP 141/78 | HR 71 | Temp 98.1°F | Resp 16 | Ht 71.5 in | Wt 281.0 lb

## 2020-12-31 DIAGNOSIS — M549 Dorsalgia, unspecified: Secondary | ICD-10-CM | POA: Diagnosis not present

## 2020-12-31 DIAGNOSIS — G8929 Other chronic pain: Secondary | ICD-10-CM | POA: Insufficient documentation

## 2020-12-31 DIAGNOSIS — D721 Eosinophilia, unspecified: Secondary | ICD-10-CM | POA: Diagnosis not present

## 2020-12-31 DIAGNOSIS — E039 Hypothyroidism, unspecified: Secondary | ICD-10-CM

## 2020-12-31 DIAGNOSIS — R5383 Other fatigue: Secondary | ICD-10-CM

## 2020-12-31 DIAGNOSIS — N1831 Chronic kidney disease, stage 3a: Secondary | ICD-10-CM

## 2020-12-31 LAB — CBC WITH DIFFERENTIAL/PLATELET
Abs Immature Granulocytes: 0.06 10*3/uL (ref 0.00–0.07)
Basophils Absolute: 0.2 10*3/uL — ABNORMAL HIGH (ref 0.0–0.1)
Basophils Relative: 2 %
Eosinophils Absolute: 0.7 10*3/uL — ABNORMAL HIGH (ref 0.0–0.5)
Eosinophils Relative: 7 %
HCT: 36 % — ABNORMAL LOW (ref 39.0–52.0)
Hemoglobin: 12.2 g/dL — ABNORMAL LOW (ref 13.0–17.0)
Immature Granulocytes: 1 %
Lymphocytes Relative: 25 %
Lymphs Abs: 2.4 10*3/uL (ref 0.7–4.0)
MCH: 32 pg (ref 26.0–34.0)
MCHC: 33.9 g/dL (ref 30.0–36.0)
MCV: 94.5 fL (ref 80.0–100.0)
Monocytes Absolute: 0.9 10*3/uL (ref 0.1–1.0)
Monocytes Relative: 10 %
Neutro Abs: 5.4 10*3/uL (ref 1.7–7.7)
Neutrophils Relative %: 55 %
Platelets: 245 10*3/uL (ref 150–400)
RBC: 3.81 MIL/uL — ABNORMAL LOW (ref 4.22–5.81)
RDW: 14 % (ref 11.5–15.5)
WBC: 9.6 10*3/uL (ref 4.0–10.5)
nRBC: 0 % (ref 0.0–0.2)

## 2020-12-31 LAB — TECHNOLOGIST SMEAR REVIEW: Plt Morphology: ADEQUATE

## 2020-12-31 LAB — COMPREHENSIVE METABOLIC PANEL
ALT: 19 U/L (ref 0–44)
AST: 20 U/L (ref 15–41)
Albumin: 3.7 g/dL (ref 3.5–5.0)
Alkaline Phosphatase: 50 U/L (ref 38–126)
Anion gap: 10 (ref 5–15)
BUN: 26 mg/dL — ABNORMAL HIGH (ref 8–23)
CO2: 26 mmol/L (ref 22–32)
Calcium: 9.6 mg/dL (ref 8.9–10.3)
Chloride: 103 mmol/L (ref 98–111)
Creatinine, Ser: 1.44 mg/dL — ABNORMAL HIGH (ref 0.61–1.24)
GFR, Estimated: 48 mL/min — ABNORMAL LOW (ref >60)
Glucose, Bld: 129 mg/dL — ABNORMAL HIGH (ref 70–99)
Potassium: 4 mmol/L (ref 3.5–5.1)
Sodium: 139 mmol/L (ref 135–145)
Total Bilirubin: 0.7 mg/dL (ref 0.3–1.2)
Total Protein: 7.2 g/dL (ref 6.5–8.1)

## 2020-12-31 LAB — LACTATE DEHYDROGENASE: LDH: 111 U/L (ref 98–192)

## 2020-12-31 NOTE — Assessment & Plan Note (Addendum)
#  Eosinophilia-mild [absolute eosinophils 900 to1100; normal-500 since- 9622]; normal total white count/hemoglobin platelets.  #The etiology is unclear; however-his eosinophilia is asymptomatic; chronic.  Suspect benign etiology rather than any malignant etiologies.  Clinically not suggestive of any autoimmune diseases/allergic reaction/drug reaction.    Check CBCs CMP LDH; peripheral blood flow cytometry; CHIC-2 mutation/to rule out hypereosinophilic syndromes.  BCR ABL-given the mild basophilia.  Discussed oftentimes bone marrow biopsy is needed to evaluate the cause of eosinophilia.  However, given the chronic/asymptomatic/mildly abnormal eosinophilia-bone marrow biopsy is not recommended at this time.  Await for above blood work.   Thank you Dr. Danise Mina for allowing me to participate in the care of your pleasant patient. Please do not hesitate to contact me with questions or concerns in the interim. Discussed with pt's son, Dr.Graul.   # DISPOSITION; # labs today- # follow up TBD-Dr.B

## 2020-12-31 NOTE — Progress Notes (Signed)
[place] NOTE  Patient Care Team: Ria Bush, MD as PCP - General (Family Medicine) Gillis Santa, MD as Consulting Physician (Pain Medicine) Deboraha Sprang, MD as Consulting Physician (Cardiology) Debbora Dus, [facility] as Pharmacist (Pharmacist)  CHIEF COMPLAINTS/PURPOSE OF CONSULTATION: Eosinophilia  # Eosinophilia-mild [absolute eosinophils 900 to1100; normal-500 since- 2018]; normal total white count/hemoglobin platelets.  # CKD- stage III; CAD  Oncology History   No history exists.     HISTORY OF PRESENTING ILLNESS:  BRAIDEN [name] 85 y.o.  male has been referred to [name] for further evaluation recommendations for elevated eosinophils.  Patient denies any history of allergic episodes.  Denies any skin rash.  Denies any chronic diarrhea.  Appetite is good.  No weight loss.  Chronic back pain.   Review of Systems  Constitutional: Negative for chills, diaphoresis, fever, malaise/fatigue and weight loss.  HENT: Negative for nosebleeds and sore throat.   Eyes: Negative for double vision.  Respiratory: Negative for cough, hemoptysis, sputum production, shortness of breath and wheezing.   Cardiovascular: Negative for chest pain, palpitations, orthopnea and leg swelling.  Gastrointestinal: Positive for constipation. Negative for abdominal pain, blood in stool, diarrhea, heartburn, melena, nausea and vomiting.  Genitourinary: Negative for dysuria, frequency and urgency.  Musculoskeletal: Positive for back pain. Negative for joint pain.  Skin: Negative.  Negative for itching and rash.  Neurological: Negative for dizziness, tingling, focal weakness, weakness and headaches.  Endo/Heme/Allergies: Does not bruise/bleed easily.  Psychiatric/Behavioral: Negative for depression. The patient is not nervous/anxious and does not have insomnia.      MEDICAL HISTORY:  Past Medical History:  Diagnosis Date  . Anosmia 1980s  . Atrial flutter ([place]) 2018   had an  ablation with dr. Erick Alley  . CAD (coronary artery disease)   . Cancer ([place])    skin cancer  . Chronic constipation   . Chronic insomnia   . Chronic lower back pain    s/p spine stimulator placement  . Complication of anesthesia    when under general, he woke up agitated and unable to be held down  . Diastolic CHF ([place])   . History of diverticulitis 2017  . History of pneumonia 2013  . Hyperlipidemia   . Hypertension   . Hypothyroidism   . Obesity, Class II, BMI 35-39.9, with comorbidity   . OSA on CPAP   . PONV (postoperative nausea and vomiting)     SURGICAL HISTORY: Past Surgical History:  Procedure Laterality Date  . A-FLUTTER ABLATION N/A 05/07/2017   Procedure: A-Flutter Ablation;  Surgeon: Deboraha Sprang, MD;  Location: [facility];  Service: Cardiovascular;  Laterality: N/A;  . CARDIOVASCULAR STRESS TEST  11/2017   no ischemia, low risk study  . COLONOSCOPY  2007   normal per prior PCP records, rpt 10 yrs (Dr Osie Cheeks)  . ELBOW SURGERY Right    ulnar nerve decompression.  PT DOES NOT RECALL THIS PROCEDURE  . ENDOVASCULAR REPAIR/STENT GRAFT N/A 05/05/2020   Procedure: ENDOVASCULAR REPAIR/STENT GRAFT;  Surgeon: Algernon Huxley, MD;  Location: [facility];  Service: Cardiovascular;  Laterality: N/A;  . LAMINECTOMY THORACIC SPINE W/ PLACEMENT SPINAL CORD STIMULATOR  08/2018   and removal of prior spine stimulator (Dr Lacinda Axon)  . NASAL SINUS SURGERY     nasal polyps. done a long time ago  . PERCUTANEOUS CORONARY STENT INTERVENTION (PCI-S)  2013   EF55%, 70% mid LAD, 99% mid RCA, mild MR, elev LVEDP, DES to mid LAD. RCA  is nondominant  . RADIOFREQUENCY ABLATION  06/2017   lumbar region [facility])   . REPLACEMENT TOTAL KNEE BILATERAL Bilateral 2000s  . SPINAL CORD STIMULATOR IMPLANT  06/2016  . SPINAL CORD STIMULATOR INSERTION N/A 09/09/2018   Procedure: LUMBAR SPINAL CORD STIMULATOR LEAD AND BATTERY REMOVAL AND LAMINECTOMY FOR PLACEMENT OF PADDLE;  Surgeon: Deetta Perla, MD;  Location: [facility];  Service: Neurosurgery;  Laterality: N/A;  . SPINAL CORD STIMULATOR INSERTION N/A 09/16/2018   Procedure: PLACEMENT OF SPINAL CORD STIMULATOR BATTERY;  Surgeon: Deetta Perla, MD;  Location: [facility];  Service: Neurosurgery;  Laterality: N/A;    SOCIAL HISTORY: Social History   Socioeconomic History  . Marital status: Widowed    Spouse name: Not on file  . Number of children: Not on file  . Years of education: Not on file  . Highest education level: Not on file  Occupational History  . Not on file  Tobacco Use  . Smoking status: Never Smoker  . Smokeless tobacco: Never Used  Vaping Use  . Vaping Use: Never used  Substance and Sexual Activity  . Alcohol use: No    Comment: QUIT DRINKING  . Drug use: No  . Sexual activity: Not Currently  Other Topics Concern  . Not on file  Social History Narrative   Widower (wife passed 08/2014).    Lives alone, GF Miss Anthony [name] sometimes (she was nurse)   Son is Dr Anthony [name] (FP at [employer]). Other son is a Psychologist, sport and exercise in [place]: retired, used to [employer] store   Edu: HS      Non-smoking; no alcohol. Lives in [place]; self.       Social Determinants of Health   Financial Resource Strain: Low Risk   . Difficulty of Paying Living Expenses: Not hard at all  Food Insecurity: No Food Insecurity  . Worried About Charity fundraiser in the Last Year: Never true  . Ran Out of Food in the Last Year: Never true  Transportation Needs: No Transportation Needs  . Lack of Transportation (Medical): No  . Lack of Transportation (Non-Medical): No  Physical Activity: Sufficiently Active  . Days of Exercise per Week: 7 days  . Minutes of Exercise per Session: 60 min  Stress: No Stress Concern Present  . Feeling of Stress : Not at all  Social Connections: Not on file  Intimate Partner Violence: Not At Risk  . Fear of Current or Ex-Partner: No  . Emotionally Abused: No  . Physically Abused: No   . Sexually Abused: No    FAMILY HISTORY: Family History  Problem Relation Age of Onset  . Stroke Mother   . Kidney disease Father   . Leukemia Brother   . Stroke Sister   . Diabetes Neg Hx     ALLERGIES:  is allergic to questran [cholestyramine], atorvastatin, gemfibrozil, metformin and related, and rosuvastatin.  MEDICATIONS:  Current Outpatient Medications  Medication Sig Dispense Refill  . acetaminophen (TYLENOL) 500 MG tablet Take 1 tablet (500 mg total) by mouth 2 (two) times daily as needed for moderate pain.    Marland Kitchen amLODipine (NORVASC) 5 MG tablet TAKE 1 TABLET(5 MG) BY MOUTH DAILY 30 tablet 2  . aspirin EC 81 MG tablet Take 1 tablet (81 mg total) by mouth daily.    . Cholecalciferol (VITAMIN D3) 1000 units CAPS Take 1 capsule (1,000 Units total) by mouth daily. 30 capsule   . clopidogrel (PLAVIX) 75 MG tablet Take 1  tablet (75 mg total) by mouth daily at 6 (six) AM. 90 tablet 3  . diphenhydramine-acetaminophen (TYLENOL PM) 25-500 MG TABS tablet Take 1 tablet by mouth at bedtime.    . docusate sodium (COLACE) 100 MG capsule Take 100 mg by mouth daily as needed.    . ezetimibe (ZETIA) 10 MG tablet TAKE 1 TABLET(10 MG) BY MOUTH DAILY 90 tablet 3  . finasteride (PROSCAR) 5 MG tablet TAKE 1 TABLET(5 MG) BY MOUTH DAILY 90 tablet 0  . fluocinonide (LIDEX) 0.05 % external solution Apply 1 application topically 2 (two) times daily as needed. (Patient taking differently: Apply 1 application topically as needed.) 60 mL 2  . furosemide (LASIX) 40 MG tablet TAKE 1 TABLET BY MOUTH EVERY DAY. TAKE AN EXTRA TABLET AS NEEDED FOR WEIGHT GAIN OF 3 LBS/DAY OR 5LBS/WEEK 135 tablet 3  . gabapentin (NEURONTIN) 300 MG capsule Take 1 capsule (300 mg total) by mouth at bedtime. With extra if needed    . guaiFENesin-codeine (CHERATUSSIN AC) 100-10 MG/5ML syrup Take 5 mLs by mouth 2 (two) times daily as needed for cough (do not mix with hydrocodone). 120 mL 0  . HYDROcodone-acetaminophen (NORCO) 10-325 MG  tablet Take 1 tablet by mouth every 8 (eight) hours as needed. 20 tablet 0  . icosapent Ethyl (VASCEPA) 1 g capsule Take 2 capsules (2 g total) by mouth 2 (two) times daily. 120 capsule 11  . ketoconazole (NIZORAL) 2 % cream Apply to the feet and face QHS. 60 g 3  . ketoconazole (NIZORAL) 2 % shampoo APPLY TOPICALLY 3 TIMES WEEKLY AS DIRECTED 120 mL 2  . levothyroxine (SYNTHROID) 150 MCG tablet Take 1 tablet (150 mcg total) by mouth daily before breakfast. 90 tablet 3  . LINZESS 290 MCG CAPS capsule TAKE 1 CAPSULE(290 MCG) BY MOUTH DAILY 30 MINUTES BEFORE FIRST MEAL OF THE DAY ON AN EMPTY STOMACH 90 capsule 1  . lisinopril (ZESTRIL) 10 MG tablet Take 1 tablet (10 mg total) by mouth daily. 90 tablet 1  . Melatonin 10 MG CAPS Take by mouth.    . mometasone (ELOCON) 0.1 % lotion Apply to scalp QHS on Monday, Wednesday, and Friday. 60 mL 3  . Multiple Vitamins-Minerals (CENTRUM SILVER PO) Take 1 tablet by mouth daily.    . Multiple Vitamins-Minerals (PRESERVISION AREDS 2 PO) Take 1 tablet by mouth daily.    . nitroGLYCERIN (NITROSTAT) 0.4 MG SL tablet Place 1 tablet (0.4 mg total) under the tongue every 5 (five) minutes as needed for chest pain. (Patient taking differently: Place 0.4 mg under the tongue every 5 (five) minutes as needed for chest pain. Patient has not used in 10 years) 25 tablet 0  . PRALUENT 75 MG/ML SOAJ INJECT 1 PEN INTO THE SKIN EVERY 14 DAYS 2 mL 9  . tiZANidine (ZANAFLEX) 2 MG tablet TAKE 1 TO 2 TABLETS(2 TO 4 MG) BY MOUTH TWICE DAILY AS NEEDED FOR MUSCLE SPASMS 30 tablet 0  . traZODone (DESYREL) 50 MG tablet Take 0.5-1 tablets (25-50 mg total) by mouth at bedtime as needed for sleep. 30 tablet 3   No current facility-administered medications for this visit.      Marland Kitchen  PHYSICAL EXAMINATION: ECOG PERFORMANCE STATUS: 0 - Asymptomatic  Vitals:   12/31/20 1113  BP: (!) 141/78  Pulse: 71  Resp: 16  Temp: 98.1 F (36.7 C)  SpO2: 94%   Filed Weights   12/31/20 1113   Weight: 281 lb (127.5 kg)    Physical Exam HENT:  Head: Normocephalic and atraumatic.     Mouth/Throat:     Pharynx: No oropharyngeal exudate.  Eyes:     Pupils: Pupils are equal, round, and reactive to light.  Cardiovascular:     Rate and Rhythm: Normal rate and regular rhythm.  Pulmonary:     Effort: No respiratory distress.     Breath sounds: No wheezing.  Abdominal:     General: Bowel sounds are normal. There is no distension.     Palpations: Abdomen is soft. There is no mass.     Tenderness: There is no abdominal tenderness. There is no guarding or rebound.  Musculoskeletal:        General: No tenderness. Normal range of motion.     Cervical back: Normal range of motion and neck supple.  Skin:    General: Skin is warm.  Neurological:     Mental Status: He is alert and oriented to person, place, and time.  Psychiatric:        Mood and Affect: Affect normal.      LABORATORY DATA:  I have reviewed the data as listed Lab Results  Component Value Date   WBC 9.6 12/31/2020   HGB 12.2 (L) 12/31/2020   HCT 36.0 (L) 12/31/2020   MCV 94.5 12/31/2020   PLT 245 12/31/2020   Recent Labs    05/04/20 1016 05/06/20 0635 06/30/20 1450 07/21/20 1340 09/28/20 1402 12/16/20 0852 12/31/20 1150  NA 139 138   < > 136 136 140 139  K 3.9 4.2   < > 4.0 4.1 4.3 4.0  CL 101 105   < > 100 103 105 103  CO2 27 25   < > _0 GLUCOSE 160* 129*   < > 126* 105* 112* 129*  BUN 31* 31*   < > 29* 36* 25* 26*  CREATININE 1.53* 1.61*   < > 2.06* 1.70* 1.24 1.44*  CALCIUM 10.5* 9.2   < > 10.5 10.1 10.7* 9.6  GFRNONAA 42* 39*  --   --   --   --  48*  GFRAA 48* 45*  --   --   --   --   --   PROT  --   --   --  7.5  --  7.2 7.2  ALBUMIN  --   --    < > 4.2 4.1 4.1 3.7  AST  --   --   --   --   --  22 20  ALT  --   --   --   --   --  21 19  ALKPHOS  --   --   --   --   --  61 50  BILITOT  --   --   --   --   --  0.6 0.7   < > = values in this interval not displayed.     RADIOGRAPHIC STUDIES: I have personally reviewed the radiological images as listed and agreed with the findings in the report. No results found.  ASSESSMENT & PLAN:   Eosinophilia #Eosinophilia-mild [absolute eosinophils 900 to1100; normal-500 since- 3382]; normal total white count/hemoglobin platelets.  #The etiology is unclear; however-his eosinophilia is asymptomatic; chronic.  Suspect benign etiology rather than any malignant etiologies.  Clinically not suggestive of any autoimmune diseases/allergic reaction/drug reaction.    Check CBCs CMP LDH; peripheral blood flow cytometry; CHIC-2 mutation/to rule out hypereosinophilic syndromes.  BCR ABL-given the mild basophilia.  Discussed oftentimes bone  marrow biopsy is needed to evaluate the cause of eosinophilia.  However, given the chronic/asymptomatic/mildly abnormal eosinophilia-bone marrow biopsy is not recommended at this time.  Await for above blood work.   Thank you Dr. Danise Mina for allowing me to participate in the care of your pleasant patient. Please do not hesitate to contact me with questions or concerns in the interim. Discussed with pt's son, Dr.Tench.   # DISPOSITION; # labs today- # follow up TBD-Dr.B    All questions were answered. The patient knows to call the clinic with any problems, questions or concerns.       Cammie Sickle, MD 01/03/2021 8:06 AM

## 2021-01-04 ENCOUNTER — Other Ambulatory Visit: Payer: Self-pay | Admitting: Family Medicine

## 2021-01-04 ENCOUNTER — Other Ambulatory Visit: Payer: Self-pay | Admitting: Internal Medicine

## 2021-01-04 DIAGNOSIS — E782 Mixed hyperlipidemia: Secondary | ICD-10-CM

## 2021-01-04 LAB — BCR-ABL1 FISH
Cells Analyzed: 200
Cells Counted: 200

## 2021-01-04 LAB — COMP PANEL: LEUKEMIA/LYMPHOMA

## 2021-01-06 ENCOUNTER — Other Ambulatory Visit: Payer: Self-pay

## 2021-01-06 ENCOUNTER — Ambulatory Visit (INDEPENDENT_AMBULATORY_CARE_PROVIDER_SITE_OTHER): Payer: Medicare Other | Admitting: Dermatology

## 2021-01-06 DIAGNOSIS — Z872 Personal history of diseases of the skin and subcutaneous tissue: Secondary | ICD-10-CM | POA: Diagnosis not present

## 2021-01-06 DIAGNOSIS — B351 Tinea unguium: Secondary | ICD-10-CM

## 2021-01-06 DIAGNOSIS — L578 Other skin changes due to chronic exposure to nonionizing radiation: Secondary | ICD-10-CM | POA: Diagnosis not present

## 2021-01-06 DIAGNOSIS — B353 Tinea pedis: Secondary | ICD-10-CM

## 2021-01-06 MED ORDER — CICLOPIROX 8 % EX SOLN: Freq: Every day | CUTANEOUS | 0 refills | Status: DC

## 2021-01-06 NOTE — Patient Instructions (Addendum)

## 2021-01-06 NOTE — Progress Notes (Signed)
   Follow-Up Visit   Subjective  Anthony [name] is a 85 y.o. male who presents for the following: Follow-up (2 months f/u on tinea on his feet treating with Ketoconazole cream with ) and Actinic Keratosis (2 months f/u on face and scalp hx of Aks ).  The following portions of the chart were reviewed this encounter and updated as appropriate:   Tobacco  Allergies  Meds  Problems  Med Hx  Surg Hx  Fam Hx     Review of Systems:  No other skin or systemic complaints except as noted in HPI or Assessment and Plan.  Objective  Well appearing patient in no apparent distress; mood and affect are within normal limits.  A focused examination was performed including face,scalp,feet. Relevant physical exam findings are noted in the Assessment and Plan.  Objective  face,scalp: Clear skin   Objective  toenails: Scaling and maceration web spaces and over distal and lateral soles.    Assessment & Plan  Hx of actinic keratosis face,scalp Clear. Observe for recurrence. Call clinic for new or changing lesions.  Recommend regular skin exams, daily broad-spectrum spf 30+ sunscreen use, and photoprotection.   Observe   Actinic Damage - chronic, secondary to cumulative UV radiation exposure/sun exposure over time - diffuse scaly erythematous macules with underlying dyspigmentation - Recommend daily broad spectrum sunscreen SPF 30+ to sun-exposed areas, reapply every 2 hours as needed.  - Recommend staying in the shade or wearing long sleeves, sun glasses (UVA+UVB protection) and wide brim hats (4-inch brim around the entire circumference of the hat). - Call for new or changing lesions.  Tinea pedis and unguium of both feet toenails Chronic and persistent Start Penlac apply to toenails at bedtime Cont Ketoconazole cream apply to feet at bedtime   Ordered Medications: ciclopirox (PENLAC) 8 % solution  Other Related Medications ketoconazole (NIZORAL) 2 % cream  Return in about 1 year  (around 01/06/2022) for TBSE.  IMarye Round, CMA, am acting as scribe for Sarina Ser, MD .  Documentation: I have reviewed the above documentation for accuracy and completeness, and I agree with the above.  Sarina Ser, MD

## 2021-01-07 LAB — MISC LABCORP TEST (SEND OUT): Labcorp test code: 511444

## 2021-01-12 ENCOUNTER — Encounter: Payer: Self-pay | Admitting: Dermatology

## 2021-01-15 ENCOUNTER — Other Ambulatory Visit: Payer: Self-pay | Admitting: Family Medicine

## 2021-01-17 MED ORDER — FINASTERIDE 5 MG PO TABS: ORAL_TABLET | ORAL | 3 refills | Status: DC

## 2021-01-17 NOTE — Telephone Encounter (Signed)
E-scribed refills.  

## 2021-01-22 ENCOUNTER — Other Ambulatory Visit: Payer: Self-pay | Admitting: Family Medicine

## 2021-01-25 ENCOUNTER — Other Ambulatory Visit: Payer: Self-pay | Admitting: Family Medicine

## 2021-01-25 NOTE — Telephone Encounter (Signed)
Linzess last filled:  11/04/20, #90 Gabapentin last filled:  11/04/20, #200 Last OV:  12/22/20, AWV prt 2 Next OV:  none

## 2021-01-26 ENCOUNTER — Telehealth: Payer: Self-pay | Admitting: Family Medicine

## 2021-01-26 ENCOUNTER — Ambulatory Visit (INDEPENDENT_AMBULATORY_CARE_PROVIDER_SITE_OTHER): Payer: Medicare Other

## 2021-01-26 ENCOUNTER — Telehealth: Payer: Self-pay

## 2021-01-26 DIAGNOSIS — Z23 Encounter for immunization: Secondary | ICD-10-CM | POA: Diagnosis not present

## 2021-01-26 NOTE — Telephone Encounter (Signed)
Refilled

## 2021-01-26 NOTE — Telephone Encounter (Signed)
Pt came in office stating that he is out of Linzess and Gabapentin. Pt stated that [facility] faxed medicine for refill three times and there was no response from provider.

## 2021-01-26 NOTE — Telephone Encounter (Signed)
ERx 

## 2021-01-26 NOTE — Telephone Encounter (Signed)
See Refill, 01/22/21.

## 2021-01-26 NOTE — Telephone Encounter (Signed)
Notified pt by Joseph Art understanding.

## 2021-01-26 NOTE — Chronic Care Management (AMB) (Signed)
Chronic Care Management Pharmacy Assistant   Name: Anthony [name]  MRN: [id number] DOB: May 12, 1936  Reason for Encounter: Refill request     Medications: Outpatient Encounter Medications as of 01/26/2021  Medication Sig  . acetaminophen (TYLENOL) 500 MG tablet Take 1 tablet (500 mg total) by mouth 2 (two) times daily as needed for moderate pain.  Marland Kitchen amLODipine (NORVASC) 5 MG tablet TAKE 1 TABLET(5 MG) BY MOUTH DAILY  . aspirin EC 81 MG tablet Take 1 tablet (81 mg total) by mouth daily.  . Cholecalciferol (VITAMIN D3) 1000 units CAPS Take 1 capsule (1,000 Units total) by mouth daily.  . ciclopirox (PENLAC) 8 % solution Apply topically at bedtime. Apply over nail and surrounding skin. Apply daily over previous coat. After seven (7) days, may remove with alcohol and continue cycle.  . clopidogrel (PLAVIX) 75 MG tablet Take 1 tablet (75 mg total) by mouth daily at 6 (six) AM.  . diphenhydramine-acetaminophen (TYLENOL PM) 25-500 MG TABS tablet Take 1 tablet by mouth at bedtime.  . docusate sodium (COLACE) 100 MG capsule Take 100 mg by mouth daily as needed.  . ezetimibe (ZETIA) 10 MG tablet TAKE 1 TABLET(10 MG) BY MOUTH DAILY  . finasteride (PROSCAR) 5 MG tablet TAKE 1 TABLET(5 MG) BY MOUTH DAILY  . fluocinonide (LIDEX) 0.05 % external solution Apply 1 application topically 2 (two) times daily as needed. (Patient taking differently: Apply 1 application topically as needed.)  . furosemide (LASIX) 40 MG tablet TAKE 1 TABLET BY MOUTH EVERY DAY. TAKE AN EXTRA TABLET AS NEEDED FOR WEIGHT GAIN OF 3 LBS/DAY OR 5LBS/WEEK  . gabapentin (NEURONTIN) 300 MG capsule Take 1 capsule (300 mg total) by mouth at bedtime. With extra if needed  . guaiFENesin-codeine (CHERATUSSIN AC) 100-10 MG/5ML syrup Take 5 mLs by mouth 2 (two) times daily as needed for cough (do not mix with hydrocodone).  Marland Kitchen HYDROcodone-acetaminophen (NORCO) 10-325 MG tablet Take 1 tablet by mouth every 8 (eight) hours as needed.  Marland Kitchen icosapent  Ethyl (VASCEPA) 1 g capsule Take 2 capsules (2 g total) by mouth 2 (two) times daily.  Marland Kitchen ketoconazole (NIZORAL) 2 % cream Apply to the feet and face QHS.  Marland Kitchen ketoconazole (NIZORAL) 2 % shampoo APPLY TOPICALLY 3 TIMES WEEKLY AS DIRECTED  . levothyroxine (SYNTHROID) 150 MCG tablet Take 1 tablet (150 mcg total) by mouth daily before breakfast.  . LINZESS 290 MCG CAPS capsule TAKE 1 CAPSULE(290 MCG) BY MOUTH DAILY 30 MINUTES BEFORE FIRST MEAL OF THE DAY ON AN EMPTY STOMACH  . lisinopril (ZESTRIL) 10 MG tablet TAKE 1 TABLET(10 MG) BY MOUTH DAILY  . Melatonin 10 MG CAPS Take by mouth.  . mometasone (ELOCON) 0.1 % lotion Apply to scalp QHS on Monday, Wednesday, and Friday.  . Multiple Vitamins-Minerals (CENTRUM SILVER PO) Take 1 tablet by mouth daily.  . Multiple Vitamins-Minerals (PRESERVISION AREDS 2 PO) Take 1 tablet by mouth daily.  . nitroGLYCERIN (NITROSTAT) 0.4 MG SL tablet Place 1 tablet (0.4 mg total) under the tongue every 5 (five) minutes as needed for chest pain. (Patient taking differently: Place 0.4 mg under the tongue every 5 (five) minutes as needed for chest pain. Patient has not used in 10 years)  . PRALUENT 75 MG/ML SOAJ INJECT 1 PEN INTO THE SKIN EVERY 14 DAYS  . tiZANidine (ZANAFLEX) 2 MG tablet TAKE 1 TO 2 TABLETS(2 TO 4 MG) BY MOUTH TWICE DAILY AS NEEDED FOR MUSCLE SPASMS  . traZODone (DESYREL) 50 MG tablet Take 0.5-1  tablets (25-50 mg total) by mouth at bedtime as needed for sleep.   No facility-administered encounter medications on file as of 01/26/2021.   Patient left voicemail inquiring about his Linzess and Gabapentin. Explained to patient that Linzess was sent to the pharmacy but patient stated he just left there and it was not there. I called [facility] to verify that they have received refill and they noted that they had. Requested refill for gabapentin from Dr. Danise Mina be sent to [facility] if appropriate.   Follow-Up:  Pharmacist Review  Debbora Dus, CPP  notified  Margaretmary Dys, [place] Pharmacy Assistant [phone]

## 2021-02-01 ENCOUNTER — Telehealth: Payer: Self-pay | Admitting: Family Medicine

## 2021-02-02 NOTE — Telephone Encounter (Signed)
Plz schedule OV- 3 mo f/u, around 03/23/21.

## 2021-02-02 NOTE — Telephone Encounter (Signed)
Noted  

## 2021-02-02 NOTE — Telephone Encounter (Signed)
Patient is scheduled EM 

## 2021-02-11 ENCOUNTER — Telehealth: Payer: Self-pay | Admitting: Internal Medicine

## 2021-02-11 DIAGNOSIS — D721 Eosinophilia, unspecified: Secondary | ICD-10-CM

## 2021-02-11 NOTE — Telephone Encounter (Signed)
On 6/16-spoke to patient's son Dr. [name] regarding negative results of the MPN panel for eosinophilia.  Suspect is completely reactive.  Would not recommend a bone marrow biopsy especially as patient is asymptomatic/and this appears chronic.  Recommend continued follow-up with PCP/follow-up with [name] if significant elevation of his eosinophils noted.  FYI-Dr.Gutierrez.

## 2021-02-11 NOTE — Telephone Encounter (Signed)
Noted. Thank you for seeing him.  

## 2021-02-14 ENCOUNTER — Telehealth: Payer: Self-pay

## 2021-02-14 NOTE — Chronic Care Management (AMB) (Addendum)
Chronic Care Management Pharmacy Assistant   Name: Anthony [name]  MRN: [id number] DOB: November 15, 1935  Reason for Encounter: CCM Appointment reminder for 02/15/21 appointment  Recent office visits:  12/22/20- PCP- Advanced care planning/counseling. Ordered urinalysis, BMP, TSH. Referred to Oncology. Decreased gabapentin to 300 mg at bedtime and 1 extra if needed. Decreased levothyroxine from 175 to 150 mcg. Discontinued lunesta.   12/15/20- LPN Visit- Medicare AWE  Recent consult visits:  01/06/21- Dermatology- actinic keratosis. Started ciclopirox 8% to apply over nail and surrounding skin daily then remove aver 7 days.  12/31/20- Oncology- Eosinophilia, unspecified type- Ordered the following labs- CBC, CMP, BCR-ABL1 FISH, Flow cytometry panel, lactate dehydrogenase. 12/01/20- Vascular surgery- AAA without rupture. No medication changes. Follow up in 12 months with ultrasound of aorta.   Hospital visits:  None in previous 6 months  Medications: Outpatient Encounter Medications as of 02/14/2021  Medication Sig   gabapentin (NEURONTIN) 300 MG capsule TAKE 2 CAPSULES BY MOUTH AT BEDTIME. WITH EXTRA CAPSULE DURING THE DAY AS NEEDED   LINZESS 290 MCG CAPS capsule TAKE 1 CAPSULE(290 MCG) BY MOUTH DAILY 30 MINUTES BEFORE FIRST MEAL OF THE DAY ON AN EMPTY STOMACH   acetaminophen (TYLENOL) 500 MG tablet Take 1 tablet (500 mg total) by mouth 2 (two) times daily as needed for moderate pain.   amLODipine (NORVASC) 5 MG tablet TAKE 1 TABLET(5 MG) BY MOUTH DAILY   aspirin EC 81 MG tablet Take 1 tablet (81 mg total) by mouth daily.   Cholecalciferol (VITAMIN D3) 1000 units CAPS Take 1 capsule (1,000 Units total) by mouth daily.   ciclopirox (PENLAC) 8 % solution Apply topically at bedtime. Apply over nail and surrounding skin. Apply daily over previous coat. After seven (7) days, may remove with alcohol and continue cycle.   clopidogrel (PLAVIX) 75 MG tablet Take 1 tablet (75 mg total) by mouth daily at 6  (six) AM.   diphenhydramine-acetaminophen (TYLENOL PM) 25-500 MG TABS tablet Take 1 tablet by mouth at bedtime.   docusate sodium (COLACE) 100 MG capsule Take 100 mg by mouth daily as needed.   ezetimibe (ZETIA) 10 MG tablet TAKE 1 TABLET(10 MG) BY MOUTH DAILY   finasteride (PROSCAR) 5 MG tablet TAKE 1 TABLET(5 MG) BY MOUTH DAILY   fluocinonide (LIDEX) 0.05 % external solution Apply 1 application topically 2 (two) times daily as needed. (Patient taking differently: Apply 1 application topically as needed.)   furosemide (LASIX) 40 MG tablet TAKE 1 TABLET BY MOUTH EVERY DAY. TAKE AN EXTRA TABLET AS NEEDED FOR WEIGHT GAIN OF 3 LBS/DAY OR 5LBS/WEEK   guaiFENesin-codeine (CHERATUSSIN AC) 100-10 MG/5ML syrup Take 5 mLs by mouth 2 (two) times daily as needed for cough (do not mix with hydrocodone).   HYDROcodone-acetaminophen (NORCO) 10-325 MG tablet Take 1 tablet by mouth every 8 (eight) hours as needed.   icosapent Ethyl (VASCEPA) 1 g capsule Take 2 capsules (2 g total) by mouth 2 (two) times daily.   ketoconazole (NIZORAL) 2 % cream Apply to the feet and face QHS.   ketoconazole (NIZORAL) 2 % shampoo APPLY TOPICALLY 3 TIMES WEEKLY AS DIRECTED   levothyroxine (SYNTHROID) 150 MCG tablet Take 1 tablet (150 mcg total) by mouth daily before breakfast.   lisinopril (ZESTRIL) 10 MG tablet TAKE 1 TABLET(10 MG) BY MOUTH DAILY   Melatonin 10 MG CAPS Take by mouth.   mometasone (ELOCON) 0.1 % lotion Apply to scalp QHS on Monday, Wednesday, and Friday.   Multiple Vitamins-Minerals (CENTRUM SILVER PO)  Take 1 tablet by mouth daily.   Multiple Vitamins-Minerals (PRESERVISION AREDS 2 PO) Take 1 tablet by mouth daily.   nitroGLYCERIN (NITROSTAT) 0.4 MG SL tablet Place 1 tablet (0.4 mg total) under the tongue every 5 (five) minutes as needed for chest pain. (Patient taking differently: Place 0.4 mg under the tongue every 5 (five) minutes as needed for chest pain. Patient has not used in 10 years)   PRALUENT 75 MG/ML  SOAJ INJECT 1 PEN INTO THE SKIN EVERY 14 DAYS   tiZANidine (ZANAFLEX) 2 MG tablet TAKE 1 TO 2 TABLETS(2 TO 4 MG) BY MOUTH TWICE DAILY AS NEEDED FOR MUSCLE SPASMS   traZODone (DESYREL) 50 MG tablet Take 0.5-1 tablets (25-50 mg total) by mouth at bedtime as needed for sleep.   No facility-administered encounter medications on file as of 02/14/2021.    [name] was contacted to remind him of his upcoming telephone visit with Debbora Dus on 02/15/21 at 10:00 AM.  He was reminded to have all medications, supplements and any blood glucose and blood pressure readings available for review at appointment.   Are you having any problems with your medications? No  What concerns would you like to discuss with the pharmacist? Denies any concerns.   Star Rating Drugs: Medication:  Last Fill: Day Supply Lisinopril 10 mg 01/17/21 [place], CPP notified  Margaretmary Dys, [place] Pharmacy Assistant [phone]   I have reviewed the care management and care coordination activities outlined in this encounter and I am certifying that I agree with the content of this note. No further action required.  Debbora Dus, PharmD Clinical Pharmacist [facility] [phone]

## 2021-02-15 ENCOUNTER — Ambulatory Visit (INDEPENDENT_AMBULATORY_CARE_PROVIDER_SITE_OTHER): Payer: Medicare Other

## 2021-02-15 ENCOUNTER — Other Ambulatory Visit: Payer: Self-pay

## 2021-02-15 ENCOUNTER — Telehealth: Payer: Self-pay

## 2021-02-15 DIAGNOSIS — E782 Mixed hyperlipidemia: Secondary | ICD-10-CM | POA: Diagnosis not present

## 2021-02-15 DIAGNOSIS — I10 Essential (primary) hypertension: Secondary | ICD-10-CM

## 2021-02-15 NOTE — Progress Notes (Signed)
.  Chronic Care Management Pharmacy Note  02/16/2021 Name:  Anthony [name] MRN:  [id number] DOB:  [birth year]  Summary: Cost concerns - Linzess, Vascepa, and Praluent. Able to afford last month but concerned cost may go higher this month. Will start Linzess PAP. [place] cholesterol funding for [employer] is currently closed.  Recommendations/Changes made from today's visit: None  Plan: Start Linzess PAP. Review [place] portal next month.    Subjective: Anthony [name] is an 85 y.o. year old male who is a primary patient of Ria Bush, MD.  The CCM team was consulted for assistance with disease management and care coordination needs.    Engaged with patient by telephone for follow up visit in response to provider referral for pharmacy case management and/or care coordination services.   Consent to Services:  The patient was given information about Chronic Care Management services, agreed to services, and gave verbal consent prior to initiation of services.  Please see initial visit note for detailed documentation.   Patient Care Team: Ria Bush, MD as PCP - General (Family Medicine) Gillis Santa, MD as Consulting Physician (Pain Medicine) Deboraha Sprang, MD as Consulting Physician (Cardiology) Debbora Dus, [facility] as Pharmacist (Pharmacist)  Recent office visits:  12/22/20- PCP- Advanced care planning/counseling. Ordered urinalysis, BMP, TSH. Referred to Oncology. Decreased gabapentin to 300 mg at bedtime and 1 extra if needed. Decreased levothyroxine from 175 to 150 mcg. Discontinued Lunesta.     Recent consult visits:  01/06/21- Dermatology- actinic keratosis. Started ciclopirox 8% to apply over nail and surrounding skin daily then remove aver 7 days. 12/31/20- Oncology- Eosinophilia, unspecified type- Ordered the following labs- CBC, CMP, BCR-ABL1 FISH, Flow cytometry panel, lactate dehydrogenase. 12/01/20- Vascular surgery- AAA  without rupture. No medication changes. Follow up in 12 months with ultrasound of aorta.    Hospital visits:  None in previous 6 months   Objective:  Lab Results  Component Value Date   CREATININE 1.44 (H) 12/31/2020   BUN 26 (H) 12/31/2020   GFR 53.89 (L) 12/16/2020   GFRNONAA 48 (L) 12/31/2020   GFRAA 45 (L) 05/06/2020   NA 139 12/31/2020   K 4.0 12/31/2020   CALCIUM 9.6 12/31/2020   CO2 26 12/31/2020   GLUCOSE 129 (H) 12/31/2020    Lab Results  Component Value Date/Time   GFR 53.89 (L) 12/16/2020 08:52 AM   GFR 36.96 (L) 09/28/2020 02:02 PM    Lab Results  Component Value Date   CHOL 120 12/16/2020   HDL 40.30 12/16/2020   LDLCALC 24 10/06/2019   LDLDIRECT 42.0 12/16/2020   TRIG 236.0 (H) 12/16/2020   CHOLHDL 3 12/16/2020    Hepatic Function Latest Ref Rng & Units 12/31/2020 12/16/2020 09/28/2020  Total Protein 6.5 - 8.1 g/dL 7.2 7.2 -  Albumin 3.5 - 5.0 g/dL 3.7 4.1 4.1  AST 15 - 41 U/L 20 22 -  ALT 0 - 44 U/L 19 21 -  Alk Phosphatase 38 - 126 U/L 50 61 -  Total Bilirubin 0.3 - 1.2 mg/dL 0.7 0.6 -  Bilirubin, Direct 0.0 - 0.2 mg/dL - - -    Lab Results  Component Value Date/Time   TSH 0.27 (L) 12/16/2020 08:52 AM   TSH 3.50 07/21/2020 01:40 PM   FREET4 0.87 12/17/2018 11:01 AM    CBC Latest Ref Rng & Units 12/31/2020 12/16/2020 07/21/2020  WBC 4.0 - 10.5 K/uL 9.6 7.6 10.4  Hemoglobin 13.0 - 17.0 g/dL 12.2(L) 13.2 13.9  Hematocrit 39.0 -  52.0 % 36.0(L) 39.5 41.0  Platelets 150 - 400 K/uL 245 276.0 320    Lab Results  Component Value Date/Time   VD25OH 37.51 12/16/2020 08:52 AM   VD25OH 27.10 (L) 07/21/2020 01:40 PM    Clinical ASCVD: Yes - CAD The ASCVD Risk score Mikey Bussing DC Jr., et al., 2013) failed to calculate for the following reasons:   The 2013 ASCVD risk score is only valid for ages 48 to 49    Depression screen PHQ 2/9 12/15/2020 10/30/2019 10/24/2018  Decreased Interest 0 0 0  Down, Depressed, Hopeless 0 0 0  PHQ - 2 Score 0 0 0  Altered  sleeping 0 0 0  Tired, decreased energy 0 0 0  Change in appetite 0 0 0  Feeling bad or failure about yourself  0 0 0  Trouble concentrating 0 0 0  Moving slowly or fidgety/restless 0 0 0  Suicidal thoughts 0 0 0  PHQ-9 Score 0 0 0  Difficult doing work/chores Not difficult at all Not difficult at all Not difficult at all    Social History   Tobacco Use  Smoking Status Never  Smokeless Tobacco Never   BP Readings from Last 3 Encounters:  12/31/20 (!) 141/78  12/22/20 134/84  12/01/20 138/84   Pulse Readings from Last 3 Encounters:  12/31/20 71  12/22/20 93  12/01/20 70   Wt Readings from Last 3 Encounters:  12/31/20 281 lb (127.5 kg)  12/22/20 274 lb 4 oz (124.4 kg)  12/01/20 279 lb (126.6 kg)   BMI Readings from Last 3 Encounters:  12/31/20 38.65 kg/m  12/22/20 37.72 kg/m  12/01/20 37.84 kg/m    Assessment/Interventions: Review of patient past medical history, allergies, medications, health status, including review of consultants reports, laboratory and other test data, was performed as part of comprehensive evaluation and provision of chronic care management services.   SDOH:  (Social Determinants of Health) assessments and interventions performed: Yes SDOH Interventions    Flowsheet Row Most Recent Value  SDOH Interventions   Financial Strain Interventions Other (Comment)  [Coverage gap - aaply for Linzess assistance]      SDOH Screenings   Alcohol Screen: Low Risk    Last Alcohol Screening Score (AUDIT): 0  Depression (PHQ2-9): Low Risk    PHQ-2 Score: 0  Financial Resource Strain: Medium Risk   Difficulty of Paying Living Expenses: Somewhat hard  Food Insecurity: No Food Insecurity   Worried About Charity fundraiser in the Last Year: Never true   Ran Out of Food in the Last Year: Never true  Housing: Low Risk    Last Housing Risk Score: 0  Physical Activity: Sufficiently Active   Days of Exercise per Week: 7 days   Minutes of Exercise per  Session: 60 min  Social Connections: Not on file  Stress: No Stress Concern Present   Feeling of Stress : Not at all  Tobacco Use: Low Risk    Smoking Tobacco Use: Never   Smokeless Tobacco Use: Never  Transportation Needs: No Transportation Needs   Lack of Transportation (Medical): No   Lack of Transportation (Non-Medical): No    CCM Care Plan  Allergies  Allergen Reactions   Questran [Cholestyramine]     Patient not aware of an allergy to this medicine.   Atorvastatin     Muscle pain   Gemfibrozil Other (See Comments)    Muscle pain.   Metformin And Related     Dizziness   Rosuvastatin  Muscle pain    Medications Reviewed Today     Reviewed by Ralene Bathe, MD (Physician) on 01/12/21 at [place] List Status: <None>   Medication Order Taking? Sig Documenting Provider Last Dose Status Informant  acetaminophen (TYLENOL) 500 MG tablet [id number] No Take 1 tablet (500 mg total) by mouth 2 (two) times daily as needed for moderate pain. Ria Bush, MD Taking Active   amLODipine (NORVASC) 5 MG tablet [id number]  TAKE 1 TABLET(5 MG) BY MOUTH DAILY Ria Bush, MD  Active   aspirin EC 81 MG tablet [id number] No Take 1 tablet (81 mg total) by mouth daily. Ria Bush, MD Taking Active   Cholecalciferol (VITAMIN D3) 1000 units CAPS [id number] No Take 1 capsule (1,000 Units total) by mouth daily. Ria Bush, MD Taking Active   ciclopirox [facility]) 8 % solution [id number] Yes Apply topically at bedtime. Apply over nail and surrounding skin. Apply daily over previous coat. After seven (7) days, may remove with alcohol and continue cycle. Ralene Bathe, MD  Active   clopidogrel (PLAVIX) 75 MG tablet [id number] No Take 1 tablet (75 mg total) by mouth daily at 6 (six) AM. Stegmayer, Janalyn Harder, PA-C Taking Active   diphenhydramine-acetaminophen (TYLENOL PM) 25-500 MG TABS tablet [id number] No Take 1 tablet by mouth at bedtime. [provider] Taking  Active   docusate sodium (COLACE) 100 MG capsule [id number] No Take 100 mg by mouth daily as needed. [provider] Taking Active Child  ezetimibe (ZETIA) 10 MG tablet [id number] No TAKE 1 TABLET(10 MG) BY MOUTH DAILY Ria Bush, MD Taking Active   finasteride (PROSCAR) 5 MG tablet [id number] No TAKE 1 TABLET(5 MG) BY MOUTH DAILY Ria Bush, MD Taking Active   fluocinonide (LIDEX) 0.05 % external solution [id number] No Apply 1 application topically 2 (two) times daily as needed.  Patient taking differently: Apply 1 application topically as needed.   Ralene Bathe, MD Taking Active   furosemide (LASIX) 40 MG tablet [id number] No TAKE 1 TABLET BY MOUTH EVERY DAY. TAKE AN EXTRA TABLET AS NEEDED FOR WEIGHT GAIN OF 3 LBS/DAY OR 5LBS/WEEK Ria Bush, MD Taking Active   gabapentin (NEURONTIN) 300 MG capsule [id number] No Take 1 capsule (300 mg total) by mouth at bedtime. With extra if needed Ria Bush, MD Taking Active   guaiFENesin-codeine [facility]) 100-10 MG/5ML syrup [id number] No Take 5 mLs by mouth 2 (two) times daily as needed for cough (do not mix with hydrocodone). Ria Bush, MD Taking Active   HYDROcodone-acetaminophen [facility]) 10-325 MG tablet [id number] No Take 1 tablet by mouth every 8 (eight) hours as needed. Ria Bush, MD Taking Active   icosapent Ethyl (VASCEPA) 1 g capsule [id number] No Take 2 capsules (2 g total) by mouth 2 (two) times daily. Deboraha Sprang, MD Taking Active   ketoconazole (NIZORAL) 2 % cream [id number] No Apply to the feet and face QHS. Ralene Bathe, MD Taking Active   ketoconazole (NIZORAL) 2 % shampoo [id number] No APPLY TOPICALLY 3 TIMES WEEKLY AS DIRECTED Ralene Bathe, MD Taking Active   levothyroxine (SYNTHROID) 150 MCG tablet [id number] No Take 1 tablet (150 mcg total) by mouth daily before breakfast. Ria Bush, MD Taking Active   LINZESS 290 MCG CAPS capsule [id number] No TAKE 1 CAPSULE(290 MCG)  BY MOUTH DAILY 30 MINUTES BEFORE FIRST MEAL OF THE DAY ON AN EMPTY STOMACH Ria Bush, MD Taking Active   lisinopril (ZESTRIL) 10 MG tablet [id number] No Take 1 tablet (10 mg  total) by mouth daily. Ria Bush, MD Taking Active   Melatonin 10 MG CAPS [id number] No Take by mouth. [provider] Taking Active   mometasone (ELOCON) 0.1 % lotion [id number] No Apply to scalp QHS on Monday, Wednesday, and Friday. Ralene Bathe, MD Taking Active   Multiple Vitamins-Minerals (CENTRUM SILVER PO) [id number] No Take 1 tablet by mouth daily. [provider] Taking Active   Multiple Vitamins-Minerals (PRESERVISION AREDS 2 PO) [id number] No Take 1 tablet by mouth daily. [provider] Taking Active   nitroGLYCERIN (NITROSTAT) 0.4 MG SL tablet [id number] No Place 1 tablet (0.4 mg total) under the tongue every 5 (five) minutes as needed for chest pain.  Patient taking differently: Place 0.4 mg under the tongue every 5 (five) minutes as needed for chest pain. Patient has not used in 10 years   Ria Bush, MD Taking Active            Med Note Amado Coe   Fri Feb 06, 2020  2:06 PM)    PRALUENT 75 MG/ML SOAJ [id number]  INJECT 1 PEN INTO THE SKIN EVERY 14 DAYS Deboraha Sprang, MD  Active   tiZANidine (ZANAFLEX) 2 MG tablet [id number] No TAKE 1 TO 2 TABLETS(2 TO 4 MG) BY MOUTH TWICE DAILY AS NEEDED FOR MUSCLE SPASMS Ria Bush, MD Taking Active   traZODone (DESYREL) 50 MG tablet [id number] No Take 0.5-1 tablets (25-50 mg total) by mouth at bedtime as needed for sleep. Ria Bush, MD Taking Active   Med List Note Landis Martins, RN 02/07/18 1239): UDS 03/2017 Medication agreement signed 08-07-2017 MR 03-09-18            Patient Active Problem List   Diagnosis Date Noted   Fatigue 12/31/2020   Pain in lateral portion of right ankle 06/30/2020   S/P endovascular aneurysm repair 04/2020   Eosinophilia 04/11/2020   Varicose veins of leg with  pain, bilateral 04/06/2020   General unsteadiness 02/07/2020   Encounter for chronic pain management 07/04/2019   Essential tremor 05/07/2019   Cystic kidney disease 04/15/2019   Osteoarthritis 04/15/2019   Seasonal allergies 04/15/2019   Anosmia 02/17/2019   Advanced care planning/counseling discussion 11/05/2018   Cough 11/05/2018   Hypercalcemia 10/26/2018   Vitamin D deficiency 10/24/2018   S/P insertion of spinal cord stimulator 09/09/2018   CKD (chronic kidney disease) stage 3, GFR 30-59 ml/min (HCC) 10/18/2017   Therapeutic opioid-induced constipation (OIC) 10/03/2017   Nasal congestion 06/26/2017   Chronic insomnia    Essential hypertension    Hypothyroidism    Severe obesity (BMI 35.0-39.9) with comorbidity ([place])    CAD (coronary artery disease)    Chronic lower back pain    History of atrial flutter 04/15/2017   History of basal cell carcinoma (BCC) 04/12/2017   History of squamous cell carcinoma in situ of skin 04/12/2017   Hypercholesterolemia with hypertriglyceridemia 04/12/2017   Chronic constipation 04/12/2017   OSA on CPAP 04/12/2017   BPH (benign prostatic hyperplasia) 60/63/0160   Diastolic heart failure ([place]) 02/18/2017   Abdominal aortic aneurysm (AAA) without rupture ([place]) 01/09/2016   Calculus of gallbladder without cholecystitis 01/09/2016   Diverticulitis of sigmoid colon 01/09/2016   Spondylolisthesis of lumbar region 01/09/2016   Recurrent major depression in remission ([place]) 03/28/2013   CAD in native artery 07/11/2012   Esophageal reflux 09/21/2011   Left leg pain 09/21/2011   Spinal stenosis 04/20/2011    Immunization History  Administered Date(s) Administered   Fluad Quad(high  Dose 65+) 05/07/2019, 06/30/2020   Hepatitis A, Adult 12/12/1996, 06/30/1997   Influenza Split 07/16/2006, 06/16/2008, 05/17/2009, 07/04/2010, 07/05/2011, 06/18/2012   Influenza, High Dose Seasonal PF 06/03/2015, 05/18/2016   Influenza,inj,Quad PF,6+ Mos 06/04/2013,  04/22/2014, 07/24/2017, 05/23/2018   PFIZER(Purple Top)SARS-COV-2 Vaccination 09/24/2019, 10/15/2019, 07/05/2020   Pneumococcal Conjugate-13 10/20/2013   Pneumococcal Polysaccharide-23 02/17/2011, 10/09/2011   Pneumococcal-Unspecified 08/28/2001   Td 08/28/2001, 10/09/2011   Zoster, Live 08/28/2013    Conditions to be addressed/monitored:  Hypertension and Hyperlipidemia  Care Plan : [place] PLan  Updates made by Debbora Dus, [place] since 02/16/2021 12:00 AM     Problem: CHL AMB "PATIENT-SPECIFIC PROBLEM"      Long-Range Goal: Disease Management   Start Date: 02/15/2021  Priority: High  Note:   Current Barriers:  Unable to independently afford treatment regimen  Pharmacist Clinical Goal(s):  Patient will verbalize ability to afford treatment regimen through collaboration with PharmD and provider.   Interventions: 1:1 collaboration with Ria Bush, MD regarding development and update of comprehensive plan of care as evidenced by provider attestation and co-signature Inter-disciplinary care team collaboration (see longitudinal plan of care) Comprehensive medication review performed; medication list updated in electronic medical record  Hypertension (BP goal <140/90) -Controlled - per clinic readings  -Current treatment: Amlodipine 5 mg - 1 tablet daily Lisinopril 10 mg - 1 tablet daily  -Medications previously tried: none   -Current home readings:  son comes over every 2 weeks, fills pillbox, and checks BP "its running fine" -Current dietary habits: does not add any salt to foods, eats at [employer] every morning (hashbrown casserole, eggs, bacon, 1 biscuit/gravy), drinks Boost and has an energy bar at noon, cooks evening meal - [place] breast sandwich, potato salad, coleslaw -Current exercise habits: exercises every morning, 300 sit ups, 300 arm lifts, walks at the mall daily 3-4 miles  -Denies hypotensive/hypertensive symptoms -Educated on BP goals and  benefits of medications for prevention of heart attack, stroke and kidney damage; Goal weight - 200 lbs, reports current weight is 265 lbs  (down from 281 lb on 12/31/20). He is trying to get down to 200 lbs. -Counseled to monitor BP at home bimonthly, document, and provide log at future appointments -Refills timely -Recommended to continue current medication  Hyperlipidemia: (LDL goal < 70) -Controlled - LDL 42 (04/22) -Current treatment: Praluent 75 mg/mL - Inject every 2 weeks  Zetia 10 mg - 1 tablet daily Vascepa 1 gm - 2 capsules twice daily  -Medications previously tried: none  -Reports last time he filled Praluent it was a little high, he may not be to able to afford going forward. He would like more information about assistance programs. The [employer] for cholesterol is temporarily closed. Reviewed income and he meets criteria for Vascepa and Praluent. We will periodically review the wesbite for enrollment openings.  -Educated on Cholesterol goals;  -Recommended to continue current medication; We will check [employer] monthly for cholesterol funds.   Patient Goals/Self-Care Activities Patient will:  - focus on medication adherence by using pillbox, taking medications as prescribed  Follow Up Plan: Telephone follow up appointment with care management team member scheduled for:  12 months  Medication Assistance: Patient reports cost concerns - Linzess. Will have Ria Comment completed PAP forms.     Compliance/Adherence/Medication fill history: [facility]: Medication:                Last Fill:         Day Supply Lisinopril 10 mg  01/17/21            90  Patient's preferred pharmacy is:  [facility], [facility] [address] [place] [address] Phone: [phone] Fax: [phone]  Uses pill box? Yes Pt endorses 100% compliance Except - he takes  tamsulosin as needed due to dizziness with daily dosing   Care Plan and Follow Up Patient Decision:  Patient agrees to Care Plan and Follow-up.  Debbora Dus, PharmD Clinical Pharmacist [facility] at [facility] [phone]

## 2021-02-15 NOTE — Chronic Care Management (AMB) (Addendum)
Chronic Care Management Pharmacy Assistant   Name: Anthony [name]  MRN: [id number] DOB: [birth year]  Reason for Encounter: Patient Assistance Application- Linzess   Medications: Outpatient Encounter Medications as of 02/15/2021  Medication Sig   gabapentin (NEURONTIN) 300 MG capsule TAKE 2 CAPSULES BY MOUTH AT BEDTIME. WITH EXTRA CAPSULE DURING THE DAY AS NEEDED   LINZESS 290 MCG CAPS capsule TAKE 1 CAPSULE(290 MCG) BY MOUTH DAILY 30 MINUTES BEFORE FIRST MEAL OF THE DAY ON AN EMPTY STOMACH   acetaminophen (TYLENOL) 500 MG tablet Take 1 tablet (500 mg total) by mouth 2 (two) times daily as needed for moderate pain.   amLODipine (NORVASC) 5 MG tablet TAKE 1 TABLET(5 MG) BY MOUTH DAILY   aspirin EC 81 MG tablet Take 1 tablet (81 mg total) by mouth daily.   Cholecalciferol (VITAMIN D3) 1000 units CAPS Take 1 capsule (1,000 Units total) by mouth daily.   ciclopirox (PENLAC) 8 % solution Apply topically at bedtime. Apply over nail and surrounding skin. Apply daily over previous coat. After seven (7) days, may remove with alcohol and continue cycle.   clopidogrel (PLAVIX) 75 MG tablet Take 1 tablet (75 mg total) by mouth daily at 6 (six) AM.   diphenhydramine-acetaminophen (TYLENOL PM) 25-500 MG TABS tablet Take 1 tablet by mouth at bedtime.   docusate sodium (COLACE) 100 MG capsule Take 100 mg by mouth daily as needed.   ezetimibe (ZETIA) 10 MG tablet TAKE 1 TABLET(10 MG) BY MOUTH DAILY   finasteride (PROSCAR) 5 MG tablet TAKE 1 TABLET(5 MG) BY MOUTH DAILY   fluocinonide (LIDEX) 0.05 % external solution Apply 1 application topically 2 (two) times daily as needed. (Patient taking differently: Apply 1 application topically as needed.)   furosemide (LASIX) 40 MG tablet TAKE 1 TABLET BY MOUTH EVERY DAY. TAKE AN EXTRA TABLET AS NEEDED FOR WEIGHT GAIN OF 3 LBS/DAY OR 5LBS/WEEK   guaiFENesin-codeine (CHERATUSSIN AC) 100-10 MG/5ML syrup Take 5 mLs by mouth 2 (two) times daily as needed for cough (do not  mix with hydrocodone).   HYDROcodone-acetaminophen (NORCO) 10-325 MG tablet Take 1 tablet by mouth every 8 (eight) hours as needed.   icosapent Ethyl (VASCEPA) 1 g capsule Take 2 capsules (2 g total) by mouth 2 (two) times daily.   ketoconazole (NIZORAL) 2 % cream Apply to the feet and face QHS.   ketoconazole (NIZORAL) 2 % shampoo APPLY TOPICALLY 3 TIMES WEEKLY AS DIRECTED   levothyroxine (SYNTHROID) 150 MCG tablet Take 1 tablet (150 mcg total) by mouth daily before breakfast.   lisinopril (ZESTRIL) 10 MG tablet TAKE 1 TABLET(10 MG) BY MOUTH DAILY   Melatonin 10 MG CAPS Take by mouth.   mometasone (ELOCON) 0.1 % lotion Apply to scalp QHS on Monday, Wednesday, and Friday.   Multiple Vitamins-Minerals (CENTRUM SILVER PO) Take 1 tablet by mouth daily.   Multiple Vitamins-Minerals (PRESERVISION AREDS 2 PO) Take 1 tablet by mouth daily.   nitroGLYCERIN (NITROSTAT) 0.4 MG SL tablet Place 1 tablet (0.4 mg total) under the tongue every 5 (five) minutes as needed for chest pain. (Patient taking differently: Place 0.4 mg under the tongue every 5 (five) minutes as needed for chest pain. Patient has not used in 10 years)   PRALUENT 75 MG/ML SOAJ INJECT 1 PEN INTO THE SKIN EVERY 14 DAYS   tiZANidine (ZANAFLEX) 2 MG tablet TAKE 1 TO 2 TABLETS(2 TO 4 MG) BY MOUTH TWICE DAILY AS NEEDED FOR MUSCLE SPASMS   traZODone (DESYREL) 50 MG tablet Take  0.5-1 tablets (25-50 mg total) by mouth at bedtime as needed for sleep.   No facility-administered encounter medications on file as of 02/15/2021.   Patient assistance application for Linzess has been completed on behalf of the patient. Patient contacted to verify if he would like the application mailed to him or if he prefers to sign in the office. Patient would like have application mailed to him. Patient aware he will need to provide requested income verification and pharmacy expense report to be sent the manufacturer.    Follow-Up:  Patient Assistance Coordination and  Pharmacist Review  Debbora Dus, CPP notified  Margaretmary Dys, [place] Assistant [phone]  I have reviewed the care management and care coordination activities outlined in this encounter and I am certifying that I agree with the content of this note. No further action required.  Debbora Dus, PharmD Clinical Pharmacist [facility] at [facility] [phone]

## 2021-02-16 NOTE — Patient Instructions (Signed)
Dear Anthony [name],  Below is a summary of the goals we discussed during our follow up appointment on February 15, 2021. Please contact me anytime with questions or concerns.   Visit Information  Patient Care Plan: CCM Pharmacy Care PLan     Problem Identified: CHL AMB "PATIENT-SPECIFIC PROBLEM"      Long-Range Goal: Disease Management   Start Date: 02/15/2021  Priority: High  Note:   Current Barriers:  Unable to independently afford treatment regimen  Pharmacist Clinical Goal(s):  Patient will verbalize ability to afford treatment regimen through collaboration with PharmD and provider.   Interventions: 1:1 collaboration with Ria Bush, MD regarding development and update of comprehensive plan of care as evidenced by provider attestation and co-signature Inter-disciplinary care team collaboration (see longitudinal plan of care) Comprehensive medication review performed; medication list updated in electronic medical record  Hypertension (BP goal <140/90) -Controlled - per clinic readings  -Current treatment: Amlodipine 5 mg - 1 tablet daily Lisinopril 10 mg - 1 tablet daily  -Medications previously tried: none   -Current home readings:  son comes over every 2 weeks, fills pillbox, and checks BP "its running fine" -Current dietary habits: does not add any salt to foods, eats at [employer] every morning (hashbrown casserole, eggs, bacon, 1 biscuit/gravy), drinks Boost and has an energy bar at noon, cooks evening meal - [place] breast sandwich, potato salad, coleslaw -Current exercise habits: exercises every morning, 300 sit ups, 300 arm lifts, walks at the mall daily 3-4 miles  -Denies hypotensive/hypertensive symptoms -Educated on BP goals and benefits of medications for prevention of heart attack, stroke and kidney damage; Goal weight - 200 lbs, reports current weight is 265 lbs  (down from 281 lb on 12/31/20). He is trying to get down to 200 lbs. -Counseled to monitor BP  at home bimonthly, document, and provide log at future appointments -Refills timely -Recommended to continue current medication  Hyperlipidemia: (LDL goal < 70) -Controlled - LDL 42 (04/22) -Current treatment: Praluent 75 mg/mL - Inject every 2 weeks  Zetia 10 mg - 1 tablet daily Vascepa 1 gm - 2 capsules twice daily  -Medications previously tried: none  -Reports last time he filled Praluent it was a little high, he may not be to able to afford going forward. He would like more information about assistance programs. The [employer] for cholesterol is temporarily closed. Reviewed income and he meets criteria for Vascepa and Praluent. We will periodically review the wesbite for enrollment openings.  -Educated on Cholesterol goals;  -Recommended to continue current medication; We will check HealthWell monthly for cholesterol funds.   Patient Goals/Self-Care Activities Patient will:  - focus on medication adherence by using pillbox, taking medications as prescribed  Follow Up Plan: Telephone follow up appointment with care management team member scheduled for:  12 months  Medication Assistance: Patient reports cost concerns - Linzess. Will have Ria Comment completed PAP forms.       The patient verbalized understanding of instructions, educational materials, and care plan provided today and declined offer to receive copy of patient instructions, educational materials, and care plan.  The pharmacy team will reach out to the patient again over the next 30 days.    Debbora Dus, PharmD Clinical Pharmacist [facility] at [facility] [phone]

## 2021-02-22 ENCOUNTER — Other Ambulatory Visit: Payer: Self-pay

## 2021-02-24 ENCOUNTER — Other Ambulatory Visit: Payer: Self-pay

## 2021-02-24 NOTE — Telephone Encounter (Signed)
Patient called today requesting refill on tizanidine to [facility].   Of note, He left a voicemail at 2:39 PM. Voicemail stated he called me yesterday asking for muscle relaxer to be sent to [facility]. States he stopped up there today at 3 PM and they do not have it.   I did not speak to patient yesterday but will request refill for tizanidine from PCP now. I have reminded him refills should be requested from his pharmacy in the future to avoid delays in care. Refills may take up to 72 hours so please call in advance.   Debbora Dus, PharmD Clinical Pharmacist [facility] at [facility] [phone]

## 2021-03-01 ENCOUNTER — Other Ambulatory Visit: Payer: Self-pay | Admitting: Neurosurgery

## 2021-03-01 DIAGNOSIS — Z20822 Contact with and (suspected) exposure to covid-19: Secondary | ICD-10-CM | POA: Diagnosis not present

## 2021-03-01 DIAGNOSIS — G894 Chronic pain syndrome: Secondary | ICD-10-CM | POA: Diagnosis not present

## 2021-03-01 NOTE — Telephone Encounter (Signed)
Zanaflex Last rx:  10/25/20, #30 Last OV:  12/22/20, AWV prt 2 Next OV:  04/06/21, 3 mo f/u

## 2021-03-02 MED ORDER — TIZANIDINE HCL 2 MG PO TABS: ORAL_TABLET | ORAL | 0 refills | Status: DC

## 2021-03-04 ENCOUNTER — Telehealth: Payer: Self-pay

## 2021-03-04 NOTE — Telephone Encounter (Signed)
New message   Patient at the front desk asking for a prescription for CPAP machine and supplies. Patient voiced this doe not need to be order by sleep MD.   Fax # [phone]

## 2021-03-07 NOTE — Telephone Encounter (Addendum)
Rx written and in [name]'s box.  

## 2021-03-09 NOTE — Telephone Encounter (Addendum)
Tried faxing the paperwork but it didn't go through faxes stated no answer. Called the patient and he gave me their number. I called them got the right fax number paperwork is faxed as requested.

## 2021-03-10 ENCOUNTER — Other Ambulatory Visit: Payer: Medicare Other

## 2021-03-10 ENCOUNTER — Telehealth: Payer: Self-pay | Admitting: *Deleted

## 2021-03-10 DIAGNOSIS — Z9989 Dependence on other enabling machines and devices: Secondary | ICD-10-CM

## 2021-03-10 DIAGNOSIS — G4733 Obstructive sleep apnea (adult) (pediatric): Secondary | ICD-10-CM

## 2021-03-10 NOTE — Telephone Encounter (Signed)
[place] with pt's DME provider left VM at Triage. The received an order for c-pap supplies yesterday. Pt's sleep study is no longer current and they can not provider pt with c-pap supplies until pt has a new sleep study they said it can be an at home study but he has to have one done and the results have to be submitted to them also due to Medicare requirements.   [place] request call back at [phone]

## 2021-03-11 ENCOUNTER — Telehealth: Payer: Self-pay

## 2021-03-11 NOTE — Progress Notes (Signed)
Chronic Care Management Pharmacy Assistant   Name: Anthony [name]  MRN: [id number] DOB: 1936-02-23  Reason for Encounter: Patient Assistance Update (Linzess, Vascepa, Praluent)   Recent office visits:  None since last CCM visit  Recent consult visits:  03/07/21 Neurosurgery- Telephone encounter- Patient called to cancel surgery  03/01/21 Neurosurgery- Discussed upcoming surgery 03/21/21 to removal spinal cord stimulator battery. No changes in medications  Hospital visits:  None in previous 6 months  Medications: Outpatient Encounter Medications as of 03/11/2021  Medication Sig Note   gabapentin (NEURONTIN) 300 MG capsule TAKE 2 CAPSULES BY MOUTH AT BEDTIME. WITH EXTRA CAPSULE DURING THE DAY AS NEEDED (Patient taking differently: Take 600 mg by mouth at bedtime.)    LINZESS 290 MCG CAPS capsule TAKE 1 CAPSULE(290 MCG) BY MOUTH DAILY 30 MINUTES BEFORE FIRST MEAL OF THE DAY ON AN EMPTY STOMACH (Patient taking differently: Take 290 mcg by mouth daily before breakfast.)    acetaminophen (TYLENOL) 500 MG tablet Take 1 tablet (500 mg total) by mouth 2 (two) times daily as needed for moderate pain. (Patient not taking: Reported on 03/04/2021)    amLODipine (NORVASC) 5 MG tablet TAKE 1 TABLET(5 MG) BY MOUTH DAILY (Patient taking differently: Take 5 mg by mouth daily.)    aspirin EC 81 MG tablet Take 1 tablet (81 mg total) by mouth daily.    Cholecalciferol (VITAMIN D3) 1000 units CAPS Take 1 capsule (1,000 Units total) by mouth daily.    ciclopirox (PENLAC) 8 % solution Apply topically at bedtime. Apply over nail and surrounding skin. Apply daily over previous coat. After seven (7) days, may remove with alcohol and continue cycle. (Patient taking differently: Apply 1 application topically at bedtime as needed. Apply over nail and surrounding skin. Apply daily over previous coat. After seven (7) days, may remove with alcohol and continue cycle.)    clopidogrel (PLAVIX) 75 MG tablet Take 1 tablet (75 mg  total) by mouth daily at 6 (six) AM.    diphenhydramine-acetaminophen (TYLENOL PM) 25-500 MG TABS tablet Take 2 tablets by mouth at bedtime.    docusate sodium (COLACE) 100 MG capsule Take 100 mg by mouth daily.    ezetimibe (ZETIA) 10 MG tablet TAKE 1 TABLET(10 MG) BY MOUTH DAILY (Patient taking differently: Take 10 mg by mouth daily. TAKE 1 TABLET(10 MG) BY MOUTH DAILY)    finasteride (PROSCAR) 5 MG tablet TAKE 1 TABLET(5 MG) BY MOUTH DAILY    fluocinonide (LIDEX) 0.05 % external solution Apply 1 application topically 2 (two) times daily as needed. (Patient taking differently: Apply 1 application topically daily as needed (irritation).)    furosemide (LASIX) 40 MG tablet TAKE 1 TABLET BY MOUTH EVERY DAY. TAKE AN EXTRA TABLET AS NEEDED FOR WEIGHT GAIN OF 3 LBS/DAY OR 5LBS/WEEK (Patient taking differently: Take 40 mg by mouth daily. TAKE 1 TABLET BY MOUTH EVERY DAY. TAKE AN EXTRA TABLET AS NEEDED FOR WEIGHT GAIN OF 3 LBS/DAY OR 5LBS/WEEK)    guaiFENesin-codeine (CHERATUSSIN AC) 100-10 MG/5ML syrup Take 5 mLs by mouth 2 (two) times daily as needed for cough (do not mix with hydrocodone). (Patient not taking: No sig reported)    HYDROcodone-acetaminophen (NORCO) 10-325 MG tablet Take 1 tablet by mouth every 8 (eight) hours as needed. (Patient taking differently: Take 1 tablet by mouth every 8 (eight) hours as needed for severe pain.)    icosapent Ethyl (VASCEPA) 1 g capsule Take 2 capsules (2 g total) by mouth 2 (two) times daily.  ketoconazole (NIZORAL) 2 % cream Apply to the feet and face QHS. (Patient taking differently: Apply 1 application topically See admin instructions. Apply to the feet and face QHS as needed)    ketoconazole (NIZORAL) 2 % shampoo APPLY TOPICALLY 3 TIMES WEEKLY AS DIRECTED (Patient taking differently: Apply 1 application topically 3 (three) times a week.)    levothyroxine (SYNTHROID) 150 MCG tablet Take 1 tablet (150 mcg total) by mouth daily before breakfast.    lisinopril  (ZESTRIL) 10 MG tablet TAKE 1 TABLET(10 MG) BY MOUTH DAILY (Patient taking differently: Take 10 mg by mouth daily.)    Melatonin 10 MG CAPS Take 10 mg by mouth at bedtime.    mometasone (ELOCON) 0.1 % lotion Apply to scalp QHS on Monday, Wednesday, and Friday. (Patient taking differently: Apply 1 application topically every Monday, Wednesday, and Friday. Apply to scalp QHS on Monday, Wednesday, and Friday.)    Multiple Vitamins-Minerals (CENTRUM SILVER PO) Take 1 tablet by mouth daily.    Multiple Vitamins-Minerals (PRESERVISION AREDS 2 PO) Take 1 tablet by mouth daily.    nitroGLYCERIN (NITROSTAT) 0.4 MG SL tablet Place 1 tablet (0.4 mg total) under the tongue every 5 (five) minutes as needed for chest pain. (Patient taking differently: Place 0.4 mg under the tongue every 5 (five) minutes as needed for chest pain. Patient has not used in 10 years)    PRALUENT 75 MG/ML SOAJ INJECT 1 PEN INTO THE SKIN EVERY 14 DAYS (Patient taking differently: Inject 75 mg into the skin every 14 (fourteen) days. INJECT 1 PEN INTO THE SKIN EVERY 14 DAYS)    tiZANidine (ZANAFLEX) 2 MG tablet TAKE 1 TO 2 TABLETS(2 TO 4 MG) BY MOUTH TWICE DAILY AS NEEDED FOR MUSCLE SPASMS (Patient taking differently: Take 2-4 mg by mouth 2 (two) times daily as needed for muscle spasms. TAKE 1 TO 2 TABLETS(2 TO 4 MG) BY MOUTH TWICE DAILY AS NEEDED FOR MUSCLE SPASMS)    traZODone (DESYREL) 50 MG tablet Take 0.5-1 tablets (25-50 mg total) by mouth at bedtime as needed for sleep. 03/04/2021: ON HOLD    No facility-administered encounter medications on file as of 03/11/2021.   Contacted  [employer]   to follow up on patient assistance application for Linzess. Per representative at  [place]  unable to process application at this time.  Reviewed [employer] website to determine if hypercholesterolemia funds were open however funds are still closed at this time.   Contacted patient to inform him of [place] funds and to determine if he has received  application in mail- should have been around the end of June. Patient states that he did receive the application but said he makes too much. He initial told Debbora Dus income was about $48,000 annually. Patient expressed he made $80,000 annually today and when I informed he would still apply up to $81,540 he said he made more than that. Will not proceed with Linzess application at this time.    Debbora Dus, CPP notified  Margaretmary Dys, [place] Pharmacy Assistant [phone]

## 2021-03-15 NOTE — Telephone Encounter (Signed)
Plz notify pt of below.  I have placed referral to sleep doctor as he will need new sleep study.  Would suggest sleep doctor through neurology - does he have preference for [facility] vs [facility]?

## 2021-03-15 NOTE — Telephone Encounter (Signed)
Spoke with pt relaying Dr. Synthia Innocent message.  Pt prefers [facility].

## 2021-03-16 DIAGNOSIS — Z20822 Contact with and (suspected) exposure to covid-19: Secondary | ICD-10-CM | POA: Diagnosis not present

## 2021-03-21 ENCOUNTER — Ambulatory Visit: Admit: 2021-03-21 | Payer: Medicare Other | Admitting: Neurosurgery

## 2021-03-21 SURGERY — LUMBAR SPINAL CORD STIMULATOR REMOVAL
Anesthesia: Monitor Anesthesia Care | Laterality: Left

## 2021-03-22 ENCOUNTER — Telehealth: Payer: Self-pay | Admitting: Family Medicine

## 2021-03-22 NOTE — Telephone Encounter (Addendum)
Per previous note from DME company: Pt's sleep study is no longer current and they can not provider pt with c-pap supplies until pt has a new sleep study This is medicare requirement.   That's why I referred him to [facility]. Recommend he f/u with [facility] for new sleep study.

## 2021-03-22 NOTE — Telephone Encounter (Signed)
Arianna called from [place] and stated she called mr. Coburn and told him that he would need to do a sleep study due to his last one was 83yr old and she can get him in September and worked in on a cancellation  he stated that he doesn't want to do that he just wants the scrpt for it and he wanted Arianna to call and see what can be done.

## 2021-03-23 NOTE — Telephone Encounter (Signed)
Spoke with pt relaying Dr. Synthia Innocent message.  Pt verbalizing understanding but still refuses to have sleep study done stating he just wants the supplies because his equipment is going out.  Says he will just stop using CPAP and don't worry about trying to get him the supplies.  FYI to Dr.G.

## 2021-03-26 ENCOUNTER — Other Ambulatory Visit: Payer: Self-pay | Admitting: Internal Medicine

## 2021-03-26 DIAGNOSIS — E782 Mixed hyperlipidemia: Secondary | ICD-10-CM

## 2021-03-31 ENCOUNTER — Telehealth: Payer: Self-pay

## 2021-03-31 NOTE — Progress Notes (Signed)
Chronic Care Management Pharmacy Assistant   Name: Anthony [name]  MRN: [id number] DOB: 04-19-1936  Reason for Encounter: Patient Assistance- [place]    Recent office visits:  None since last CCM contact  Recent consult visits:  None since last CCM contact  Hospital visits:  None in previous 6 months  Medications: Outpatient Encounter Medications as of 03/31/2021  Medication Sig Note   gabapentin (NEURONTIN) 300 MG capsule TAKE 2 CAPSULES BY MOUTH AT BEDTIME. WITH EXTRA CAPSULE DURING THE DAY AS NEEDED (Patient taking differently: Take 600 mg by mouth at bedtime.)    LINZESS 290 MCG CAPS capsule TAKE 1 CAPSULE(290 MCG) BY MOUTH DAILY 30 MINUTES BEFORE FIRST MEAL OF THE DAY ON AN EMPTY STOMACH (Patient taking differently: Take 290 mcg by mouth daily before breakfast.)    acetaminophen (TYLENOL) 500 MG tablet Take 1 tablet (500 mg total) by mouth 2 (two) times daily as needed for moderate pain. (Patient not taking: Reported on 03/04/2021)    amLODipine (NORVASC) 5 MG tablet TAKE 1 TABLET(5 MG) BY MOUTH DAILY (Patient taking differently: Take 5 mg by mouth daily.)    aspirin EC 81 MG tablet Take 1 tablet (81 mg total) by mouth daily.    Cholecalciferol (VITAMIN D3) 1000 units CAPS Take 1 capsule (1,000 Units total) by mouth daily.    ciclopirox (PENLAC) 8 % solution Apply topically at bedtime. Apply over nail and surrounding skin. Apply daily over previous coat. After seven (7) days, may remove with alcohol and continue cycle. (Patient taking differently: Apply 1 application topically at bedtime as needed. Apply over nail and surrounding skin. Apply daily over previous coat. After seven (7) days, may remove with alcohol and continue cycle.)    clopidogrel (PLAVIX) 75 MG tablet Take 1 tablet (75 mg total) by mouth daily at 6 (six) AM.    diphenhydramine-acetaminophen (TYLENOL PM) 25-500 MG TABS tablet Take 2 tablets by mouth at bedtime.    docusate sodium (COLACE) 100 MG capsule Take 100  mg by mouth daily.    ezetimibe (ZETIA) 10 MG tablet TAKE 1 TABLET(10 MG) BY MOUTH DAILY (Patient taking differently: Take 10 mg by mouth daily. TAKE 1 TABLET(10 MG) BY MOUTH DAILY)    finasteride (PROSCAR) 5 MG tablet TAKE 1 TABLET(5 MG) BY MOUTH DAILY    fluocinonide (LIDEX) 0.05 % external solution Apply 1 application topically 2 (two) times daily as needed. (Patient taking differently: Apply 1 application topically daily as needed (irritation).)    furosemide (LASIX) 40 MG tablet TAKE 1 TABLET BY MOUTH EVERY DAY. TAKE AN EXTRA TABLET AS NEEDED FOR WEIGHT GAIN OF 3 LBS/DAY OR 5LBS/WEEK (Patient taking differently: Take 40 mg by mouth daily. TAKE 1 TABLET BY MOUTH EVERY DAY. TAKE AN EXTRA TABLET AS NEEDED FOR WEIGHT GAIN OF 3 LBS/DAY OR 5LBS/WEEK)    guaiFENesin-codeine (CHERATUSSIN AC) 100-10 MG/5ML syrup Take 5 mLs by mouth 2 (two) times daily as needed for cough (do not mix with hydrocodone). (Patient not taking: No sig reported)    HYDROcodone-acetaminophen (NORCO) 10-325 MG tablet Take 1 tablet by mouth every 8 (eight) hours as needed. (Patient taking differently: Take 1 tablet by mouth every 8 (eight) hours as needed for severe pain.)    icosapent Ethyl (VASCEPA) 1 g capsule Take 2 capsules (2 g total) by mouth 2 (two) times daily.    ketoconazole (NIZORAL) 2 % cream Apply to the feet and face QHS. (Patient taking differently: Apply 1 application topically See admin instructions. Apply  to the feet and face QHS as needed)    ketoconazole (NIZORAL) 2 % shampoo APPLY TOPICALLY 3 TIMES WEEKLY AS DIRECTED (Patient taking differently: Apply 1 application topically 3 (three) times a week.)    levothyroxine (SYNTHROID) 150 MCG tablet Take 1 tablet (150 mcg total) by mouth daily before breakfast.    lisinopril (ZESTRIL) 10 MG tablet TAKE 1 TABLET(10 MG) BY MOUTH DAILY (Patient taking differently: Take 10 mg by mouth daily.)    Melatonin 10 MG CAPS Take 10 mg by mouth at bedtime.    mometasone (ELOCON)  0.1 % lotion Apply to scalp QHS on Monday, Wednesday, and Friday. (Patient taking differently: Apply 1 application topically every Monday, Wednesday, and Friday. Apply to scalp QHS on Monday, Wednesday, and Friday.)    Multiple Vitamins-Minerals (CENTRUM SILVER PO) Take 1 tablet by mouth daily.    Multiple Vitamins-Minerals (PRESERVISION AREDS 2 PO) Take 1 tablet by mouth daily.    nitroGLYCERIN (NITROSTAT) 0.4 MG SL tablet Place 1 tablet (0.4 mg total) under the tongue every 5 (five) minutes as needed for chest pain. (Patient taking differently: Place 0.4 mg under the tongue every 5 (five) minutes as needed for chest pain. Patient has not used in 10 years)    PRALUENT 75 MG/ML SOAJ INJECT 1 PEN INTO THE SKIN EVERY 14 DAYS (Patient taking differently: Inject 75 mg into the skin every 14 (fourteen) days. INJECT 1 PEN INTO THE SKIN EVERY 14 DAYS)    tiZANidine (ZANAFLEX) 2 MG tablet TAKE 1 TO 2 TABLETS(2 TO 4 MG) BY MOUTH TWICE DAILY AS NEEDED FOR MUSCLE SPASMS (Patient taking differently: Take 2-4 mg by mouth 2 (two) times daily as needed for muscle spasms. TAKE 1 TO 2 TABLETS(2 TO 4 MG) BY MOUTH TWICE DAILY AS NEEDED FOR MUSCLE SPASMS)    traZODone (DESYREL) 50 MG tablet Take 0.5-1 tablets (25-50 mg total) by mouth at bedtime as needed for sleep. 03/04/2021: ON HOLD    No facility-administered encounter medications on file as of 03/31/2021.    Reviewed [employer]well website to determine if hypercholesterolemia funds were open however funds are still closed for the month of August.    Star Rating Drugs: Med Name  Last Venida [place] Date Day supply Lisinopril 10 mg 01/17/21 [place], CPP notified  Margaretmary Dys, [place] Assistant [phone]

## 2021-04-06 ENCOUNTER — Encounter: Payer: Self-pay | Admitting: Family Medicine

## 2021-04-06 ENCOUNTER — Other Ambulatory Visit: Payer: Self-pay

## 2021-04-06 ENCOUNTER — Ambulatory Visit (INDEPENDENT_AMBULATORY_CARE_PROVIDER_SITE_OTHER): Payer: Medicare Other | Admitting: Family Medicine

## 2021-04-06 VITALS — BP 144/90 | HR 85 | Temp 97.6°F | Ht 71.5 in | Wt 270.3 lb

## 2021-04-06 DIAGNOSIS — M545 Low back pain, unspecified: Secondary | ICD-10-CM

## 2021-04-06 DIAGNOSIS — Z9989 Dependence on other enabling machines and devices: Secondary | ICD-10-CM

## 2021-04-06 DIAGNOSIS — G8929 Other chronic pain: Secondary | ICD-10-CM

## 2021-04-06 DIAGNOSIS — D721 Eosinophilia, unspecified: Secondary | ICD-10-CM

## 2021-04-06 DIAGNOSIS — Z9689 Presence of other specified functional implants: Secondary | ICD-10-CM | POA: Diagnosis not present

## 2021-04-06 DIAGNOSIS — N1831 Chronic kidney disease, stage 3a: Secondary | ICD-10-CM

## 2021-04-06 DIAGNOSIS — K5909 Other constipation: Secondary | ICD-10-CM | POA: Diagnosis not present

## 2021-04-06 DIAGNOSIS — G4733 Obstructive sleep apnea (adult) (pediatric): Secondary | ICD-10-CM

## 2021-04-06 DIAGNOSIS — I10 Essential (primary) hypertension: Secondary | ICD-10-CM

## 2021-04-06 MED ORDER — SENNOSIDES 8.6 MG PO TABS: 1.0000 | ORAL_TABLET | Freq: Every day | ORAL | 3 refills | Status: DC

## 2021-04-06 NOTE — Progress Notes (Signed)
Patient ID: Anthony [name], male    DOB: [birth year], 85 y.o.   MRN: [id number]  This visit was conducted in person.  BP (!) 144/90 (BP Location: Right Arm, Cuff Size: Large)   Pulse 85   Temp 97.6 F (36.4 C) (Temporal)   Ht 5' 11.5" (1.816 m)   Wt 270 lb 5 oz (122.6 kg)   SpO2 96%   BMI 37.18 kg/m    CC: 3 mo f/u visit  Subjective:   HPI: Anthony [name] is a 85 y.o. male presenting on 04/06/2021 for Follow-up (Here for 3 mo f/u.)   Last [name] 11/2020.   OSA - see recent phone note. DME for current CPAP machine is requiring new sleep study as last one done >10 yrs ago - pt did not want to repeat sleep study. Has agreed to rpt study - appt scheduled Sept 2022.   Chronic lower back pain s/p failed spinal cord stimulator, last saw Dr Lacinda Axon neurosurgery 02/2021. Had discussed battery removal, but he decided to retry with some benefit so it stays in place.   Chronic eosinophilia - saw hematology Rogue Bussing), after workup thought benign cause planned monitoring. "Negative results of the MPN panel for eosinophilia.  Suspect is completely reactive.  Would not recommend a bone marrow biopsy especially as patient is asymptomatic/and this appears chronic. Recommend continued follow-up with PCP/follow-up with [name] if significant elevation of his eosinophils noted."   Renal insufficiency - lisinopril dose recently dropped.  Chronic lower back pain managing with gabapentin 633m qhs and 6055mPRN during the day. Also on tizanidine 41m51mrn. Not currently taking hydrocodone due to constipation.   Chronic constipation has been managing with linzess 290m64mily with Align probiotic as well - can take 2 at once. Good water and fiber.  Doesn't like powder form of medication (miralax or metamucil).      Relevant past medical, surgical, family and social history reviewed and updated as indicated. Interim medical history since our last visit reviewed. Allergies and medications reviewed and  updated. Outpatient Medications Prior to Visit  Medication Sig Dispense Refill   acetaminophen (TYLENOL) 500 MG tablet Take 1 tablet (500 mg total) by mouth 2 (two) times daily as needed for moderate pain.     amLODipine (NORVASC) 5 MG tablet TAKE 1 TABLET(5 MG) BY MOUTH DAILY (Patient taking differently: Take 5 mg by mouth daily.) 30 tablet 2   aspirin EC 81 MG tablet Take 1 tablet (81 mg total) by mouth daily.     Cholecalciferol (VITAMIN D3) 1000 units CAPS Take 1 capsule (1,000 Units total) by mouth daily. 30 capsule    ciclopirox (PENLAC) 8 % solution Apply topically at bedtime. Apply over nail and surrounding skin. Apply daily over previous coat. After seven (7) days, may remove with alcohol and continue cycle. (Patient taking differently: Apply 1 application topically at bedtime as needed. Apply over nail and surrounding skin. Apply daily over previous coat. After seven (7) days, may remove with alcohol and continue cycle.) 6.6 mL 0   clopidogrel (PLAVIX) 75 MG tablet Take 1 tablet (75 mg total) by mouth daily at 6 (six) AM. 90 tablet 3   diphenhydramine-acetaminophen (TYLENOL PM) 25-500 MG TABS tablet Take 2 tablets by mouth at bedtime.     docusate sodium (COLACE) 100 MG capsule Take 100 mg by mouth daily.     ezetimibe (ZETIA) 10 MG tablet TAKE 1 TABLET(10 MG) BY MOUTH DAILY (Patient taking differently: Take 10 mg by mouth  daily. TAKE 1 TABLET(10 MG) BY MOUTH DAILY) 90 tablet 3   finasteride (PROSCAR) 5 MG tablet TAKE 1 TABLET(5 MG) BY MOUTH DAILY 90 tablet 3   fluocinonide (LIDEX) 0.05 % external solution Apply 1 application topically 2 (two) times daily as needed. (Patient taking differently: Apply 1 application topically daily as needed (irritation).) 60 mL 2   furosemide (LASIX) 40 MG tablet TAKE 1 TABLET BY MOUTH EVERY DAY. TAKE AN EXTRA TABLET AS NEEDED FOR WEIGHT GAIN OF 3 LBS/DAY OR 5LBS/WEEK (Patient taking differently: Take 40 mg by mouth daily. TAKE 1 TABLET BY MOUTH EVERY DAY.  TAKE AN EXTRA TABLET AS NEEDED FOR WEIGHT GAIN OF 3 LBS/DAY OR 5LBS/WEEK) 135 tablet 3   gabapentin (NEURONTIN) 300 MG capsule TAKE 2 CAPSULES BY MOUTH AT BEDTIME. WITH EXTRA CAPSULE DURING THE DAY AS NEEDED (Patient taking differently: Take 600 mg by mouth at bedtime.) 200 capsule 1   guaiFENesin-codeine (CHERATUSSIN AC) 100-10 MG/5ML syrup Take 5 mLs by mouth 2 (two) times daily as needed for cough (do not mix with hydrocodone). 120 mL 0   HYDROcodone-acetaminophen (NORCO) 10-325 MG tablet Take 1 tablet by mouth every 8 (eight) hours as needed. (Patient taking differently: Take 1 tablet by mouth every 8 (eight) hours as needed for severe pain.) 20 tablet 0   icosapent Ethyl (VASCEPA) 1 g capsule Take 2 capsules (2 g total) by mouth 2 (two) times daily. 120 capsule 11   ketoconazole (NIZORAL) 2 % cream Apply to the feet and face QHS. (Patient taking differently: Apply 1 application topically See admin instructions. Apply to the feet and face QHS as needed) 60 g 3   ketoconazole (NIZORAL) 2 % shampoo APPLY TOPICALLY 3 TIMES WEEKLY AS DIRECTED (Patient taking differently: Apply 1 application topically 3 (three) times a week.) 120 mL 2   levothyroxine (SYNTHROID) 150 MCG tablet Take 1 tablet (150 mcg total) by mouth daily before breakfast. 90 tablet 3   LINZESS 290 MCG CAPS capsule TAKE 1 CAPSULE(290 MCG) BY MOUTH DAILY 30 MINUTES BEFORE FIRST MEAL OF THE DAY ON AN EMPTY STOMACH (Patient taking differently: Take 290 mcg by mouth daily before breakfast.) 90 capsule 1   lisinopril (ZESTRIL) 10 MG tablet TAKE 1 TABLET(10 MG) BY MOUTH DAILY (Patient taking differently: Take 10 mg by mouth daily.) 90 tablet 3   Melatonin 10 MG CAPS Take 10 mg by mouth at bedtime.     mometasone (ELOCON) 0.1 % lotion Apply to scalp QHS on Monday, Wednesday, and Friday. (Patient taking differently: Apply 1 application topically every Monday, Wednesday, and Friday. Apply to scalp QHS on Monday, Wednesday, and Friday.) 60 mL 3    Multiple Vitamins-Minerals (CENTRUM SILVER PO) Take 1 tablet by mouth daily.     Multiple Vitamins-Minerals (PRESERVISION AREDS 2 PO) Take 1 tablet by mouth daily.     nitroGLYCERIN (NITROSTAT) 0.4 MG SL tablet Place 1 tablet (0.4 mg total) under the tongue every 5 (five) minutes as needed for chest pain. (Patient taking differently: Place 0.4 mg under the tongue every 5 (five) minutes as needed for chest pain. Patient has not used in 10 years) 25 tablet 0   PRALUENT 75 MG/ML SOAJ INJECT 1 PEN INTO THE SKIN EVERY 14 DAYS (Patient taking differently: Inject 75 mg into the skin every 14 (fourteen) days. INJECT 1 PEN INTO THE SKIN EVERY 14 DAYS) 2 mL 11   tiZANidine (ZANAFLEX) 2 MG tablet TAKE 1 TO 2 TABLETS(2 TO 4 MG) BY MOUTH TWICE DAILY AS  NEEDED FOR MUSCLE SPASMS (Patient taking differently: Take 2-4 mg by mouth 2 (two) times daily as needed for muscle spasms. TAKE 1 TO 2 TABLETS(2 TO 4 MG) BY MOUTH TWICE DAILY AS NEEDED FOR MUSCLE SPASMS) 30 tablet 0   traZODone (DESYREL) 50 MG tablet Take 0.5-1 tablets (25-50 mg total) by mouth at bedtime as needed for sleep. 30 tablet 3   No facility-administered medications prior to visit.     Per HPI unless specifically indicated in ROS section below Review of Systems  Objective:  BP (!) 144/90 (BP Location: Right Arm, Cuff Size: Large)   Pulse 85   Temp 97.6 F (36.4 C) (Temporal)   Ht 5' 11.5" (1.816 m)   Wt 270 lb 5 oz (122.6 kg)   SpO2 96%   BMI 37.18 kg/m   Wt Readings from Last 3 Encounters:  04/06/21 270 lb 5 oz (122.6 kg)  12/31/20 281 lb (127.5 kg)  12/22/20 274 lb 4 oz (124.4 kg)      Physical Exam Vitals and nursing note reviewed.  Constitutional:      Appearance: Normal appearance. He is not ill-appearing.  Cardiovascular:     Rate and Rhythm: Normal rate and regular rhythm.     Pulses: Normal pulses.     Heart sounds: Normal heart sounds. No murmur heard. Pulmonary:     Effort: Pulmonary effort is normal. No respiratory  distress.     Breath sounds: Normal breath sounds. No wheezing, rhonchi or rales.  Musculoskeletal:     Right lower leg: No edema.     Left lower leg: No edema.  Neurological:     Mental Status: He is alert.  Psychiatric:        Mood and Affect: Mood normal.        Behavior: Behavior normal.      Lab Results  Component Value Date   CREATININE 1.44 (H) 12/31/2020   BUN 26 (H) 12/31/2020   NA 139 12/31/2020   K 4.0 12/31/2020   CL 103 12/31/2020   CO2 26 12/31/2020    Assessment & Plan:  This visit occurred during the SARS-CoV-2 public health emergency.  Safety protocols were in place, including screening questions prior to the visit, additional usage of staff PPE, and extensive cleaning of exam room while observing appropriate contact time as indicated for disinfecting solutions.   Problem List Items Addressed This Visit     Chronic constipation    Ongoing despite linzess, taking BID at times - rec max one daily. Will add sennakot 1 tab daily PRN constipation.       Relevant Medications   senna (SENOKOT) 8.6 MG tablet   OSA on CPAP    Upcoming sleep study next month - needs this prior to getting new CPAP approved       Essential hypertension    Chronic, BP borderline elevated today - no med changes today.       Chronic lower back pain - Primary    Avoiding opiates given side effects namely constipation.       CKD (chronic kidney disease) stage 3, GFR 30-59 ml/min (HCC)   S/P insertion of spinal cord stimulator   Encounter for chronic pain management    [place] CSRS reviewed Not regularly using opiate.       Eosinophilia    Appreciate heme eval - reassuring, released back to PCP f/u.         Meds ordered this encounter  Medications   senna (SENOKOT)  8.6 MG tablet    Sig: Take 1 tablet (8.6 mg total) by mouth daily.    Dispense:  30 tablet    Refill:  3    No orders of the defined types were placed in this encounter.    Patient Instructions  For  constipation- continue linzess 288mg max 1 tablet daily  May add sennakot 1 tablet daily as needed for constipation.  Ok to continue miralax 1/2-1 capful as needed.  Let me know how this helps - if ongoing constipation, we will add another medication.   Follow up plan: Return in about 3 months (around 07/07/2021) for follow up visit.  JRia Bush MD

## 2021-04-06 NOTE — Patient Instructions (Addendum)
For constipation- continue linzess 215mg max 1 tablet daily  May add sennakot 1 tablet daily as needed for constipation.  Ok to continue miralax 1/2-1 capful as needed.  Let me know how this helps - if ongoing constipation, we will add another medication.

## 2021-04-08 NOTE — Assessment & Plan Note (Signed)
Chronic, BP borderline elevated today - no med changes today.

## 2021-04-08 NOTE — Assessment & Plan Note (Signed)
Upcoming sleep study next month - needs this prior to getting new CPAP approved

## 2021-04-08 NOTE — Assessment & Plan Note (Signed)
[place] CSRS reviewed Not regularly using opiate.

## 2021-04-08 NOTE — Assessment & Plan Note (Signed)
Ongoing despite linzess, taking BID at times - rec max one daily. Will add sennakot 1 tab daily PRN constipation.

## 2021-04-08 NOTE — Assessment & Plan Note (Signed)
Appreciate heme eval - reassuring, released back to PCP f/u.

## 2021-04-08 NOTE — Assessment & Plan Note (Signed)
Avoiding opiates given side effects namely constipation.

## 2021-04-10 DIAGNOSIS — Z20822 Contact with and (suspected) exposure to covid-19: Secondary | ICD-10-CM | POA: Diagnosis not present

## 2021-04-11 ENCOUNTER — Encounter: Payer: Self-pay | Admitting: Family Medicine

## 2021-04-11 DIAGNOSIS — Z9989 Dependence on other enabling machines and devices: Secondary | ICD-10-CM

## 2021-04-11 DIAGNOSIS — G4733 Obstructive sleep apnea (adult) (pediatric): Secondary | ICD-10-CM

## 2021-04-12 ENCOUNTER — Other Ambulatory Visit: Payer: Self-pay | Admitting: Family Medicine

## 2021-04-26 ENCOUNTER — Other Ambulatory Visit (INDEPENDENT_AMBULATORY_CARE_PROVIDER_SITE_OTHER): Payer: Self-pay | Admitting: Vascular Surgery

## 2021-04-27 ENCOUNTER — Other Ambulatory Visit: Payer: Self-pay | Admitting: Family Medicine

## 2021-04-27 ENCOUNTER — Telehealth: Payer: Self-pay | Admitting: Family Medicine

## 2021-04-27 NOTE — Telephone Encounter (Signed)
Pt came into office wanting the provider to contact the sleep study center to set up an appt for him. Pt thought he had one but he does not.

## 2021-04-27 NOTE — Telephone Encounter (Signed)
Tizanidine Last filled:  03/02/21, #30 Last OV:  04/06/21, chronic pain f/u Next OV:  none

## 2021-04-27 NOTE — Telephone Encounter (Signed)
I sent message to [facility] Neurological/sleep study department in the referral notes and asked to have them reach out to the patient and schedule. Patient advised.

## 2021-04-28 NOTE — Telephone Encounter (Signed)
ERx 

## 2021-05-03 ENCOUNTER — Other Ambulatory Visit: Payer: Self-pay | Admitting: Family Medicine

## 2021-05-04 ENCOUNTER — Telehealth: Payer: Self-pay

## 2021-05-04 NOTE — Progress Notes (Addendum)
Pt has expressed need for assistance with medication cost. Reviewed [facility] for open enrollment for Vascepa and Praluent, no funds available.   Debbora Dus, CPP notified  Avel Sensor, [place] Assistant [phone]  I have reviewed the care management and care coordination activities outlined in this encounter and I am certifying that I agree with the content of this note. No further action required.  Debbora Dus, PharmD Clinical Pharmacist [facility] at [facility] [phone]

## 2021-05-07 ENCOUNTER — Other Ambulatory Visit: Payer: Self-pay | Admitting: Family Medicine

## 2021-05-10 ENCOUNTER — Other Ambulatory Visit: Payer: Self-pay | Admitting: Internal Medicine

## 2021-05-11 NOTE — Telephone Encounter (Signed)
Plz clarify with patient - is he taking lovaza, generic lovaza, or vascepa? Last prescribed 1 month supply of vascepa through Dr Caryl Comes this month.

## 2021-05-12 NOTE — Telephone Encounter (Signed)
Spoke with pt asking about Lovaza, generic and Vascepa.  Pt states he only takes Vascepa and does get it from Dr. Caryl Comes.

## 2021-05-18 ENCOUNTER — Other Ambulatory Visit: Payer: Self-pay | Admitting: Dermatology

## 2021-05-27 NOTE — Telephone Encounter (Signed)
Sleep study is scanned under media 09/21/2003  Most recent CPAP reading is also scanned in media 2019

## 2021-05-31 ENCOUNTER — Ambulatory Visit (INDEPENDENT_AMBULATORY_CARE_PROVIDER_SITE_OTHER): Payer: Medicare Other | Admitting: Internal Medicine

## 2021-05-31 ENCOUNTER — Encounter: Payer: Self-pay | Admitting: Internal Medicine

## 2021-05-31 ENCOUNTER — Other Ambulatory Visit: Payer: Self-pay

## 2021-05-31 VITALS — BP 132/92 | HR 90 | Ht 71.5 in | Wt 268.0 lb

## 2021-05-31 DIAGNOSIS — I483 Typical atrial flutter: Secondary | ICD-10-CM

## 2021-05-31 DIAGNOSIS — E039 Hypothyroidism, unspecified: Secondary | ICD-10-CM

## 2021-05-31 DIAGNOSIS — I10 Essential (primary) hypertension: Secondary | ICD-10-CM | POA: Diagnosis not present

## 2021-05-31 DIAGNOSIS — E782 Mixed hyperlipidemia: Secondary | ICD-10-CM

## 2021-05-31 DIAGNOSIS — I5030 Unspecified diastolic (congestive) heart failure: Secondary | ICD-10-CM

## 2021-05-31 NOTE — Progress Notes (Signed)
Patient Care Team: Ria Bush, MD as PCP - General (Family Medicine) Gillis Santa, MD as Consulting Physician (Pain Medicine) Deboraha Sprang, MD as Consulting Physician (Cardiology) Debbora Dus, [facility] as Pharmacist (Pharmacist)   HPI  Anthony [name] is a 85 y.o. male is the  father of [name] with a history of atrial flutter for which he underwent catheter ablation 9/18.   He has a history of coronary artery disease with prior stenting of his LAD and his RCA in 2013.    Echocardiogram 6/18 demonstrated normal LV function and normal left atrial size\ Blood hyperlipidemia and was referred to the lipid clinic and was started on a PCSK9 inhibitor  The patient denies, shortness of breath, nocturnal dyspnea, orthopnea or peripheral edema.  There have been no palpitations, lightheadedness or syncope.  Complains of chest pain.  Nonexertional.  Duration seconds 20-40  Reviewing his history of his stent, both were accomplished without antecedent symptoms because of findings on imaging testing.      Date Cr K Hgb  9/18   14.4  2/19 1.45 4.3    9/19  1.25 3.8 14.0  4/20 1.22 4.0   8/21 1.8 4.2 13.2  5/2 1.44 4.0    DATE TEST EF   6/18 Echo   55-65 %   4/19 MYOVIEW  57 % Non Ischemic           Past Medical History:  Diagnosis Date   Anosmia 1980s   Atrial flutter ([place]) 2018   had an ablation with dr. Erick Alley   CAD (coronary artery disease)    Cancer ([place])    skin cancer   Chronic constipation    Chronic insomnia    Chronic lower back pain    s/p spine stimulator placement   Complication of anesthesia    when under general, he woke up agitated and unable to be held down   Diastolic CHF ([place])    History of diverticulitis 2017   History of pneumonia 2013   Hyperlipidemia    Hypertension    Hypothyroidism    Obesity, Class II, BMI 35-39.9, with comorbidity    OSA on CPAP    PONV (postoperative nausea and vomiting)     Past Surgical History:   Procedure Laterality Date   A-FLUTTER ABLATION N/A 05/07/2017   Procedure: A-Flutter Ablation;  Surgeon: Deboraha Sprang, MD;  Location: [facility];  Service: Cardiovascular;  Laterality: N/A;   CARDIOVASCULAR STRESS TEST  11/2017   no ischemia, low risk study   COLONOSCOPY  2007   normal per prior PCP records, rpt 10 yrs (Dr Osie Cheeks)   [place] Right    ulnar nerve decompression.  PT DOES NOT RECALL THIS PROCEDURE   ENDOVASCULAR REPAIR/STENT GRAFT N/A 05/05/2020   Procedure: ENDOVASCULAR REPAIR/STENT GRAFT;  Surgeon: Algernon Huxley, MD;  Location: [facility];  Service: Cardiovascular;  Laterality: N/A;   LAMINECTOMY THORACIC SPINE W/ PLACEMENT SPINAL CORD STIMULATOR  08/2018   and removal of prior spine stimulator (Dr Lacinda Axon)   NASAL SINUS SURGERY     nasal polyps. done a long time ago   [place] (PCI-S)  2013   EF55%, 70% mid LAD, 99% mid RCA, mild MR, elev LVEDP, DES to mid LAD. RCA is nondominant   RADIOFREQUENCY ABLATION  06/2017   lumbar region ([place])    REPLACEMENT TOTAL KNEE BILATERAL Bilateral 2000s   SPINAL CORD STIMULATOR IMPLANT  06/2016   SPINAL CORD STIMULATOR INSERTION N/A 09/09/2018   Procedure: LUMBAR SPINAL CORD STIMULATOR LEAD AND BATTERY REMOVAL AND LAMINECTOMY FOR PLACEMENT OF PADDLE;  Surgeon: Deetta Perla, MD;  Location: [facility];  Service: Neurosurgery;  Laterality: N/A;   SPINAL CORD STIMULATOR INSERTION N/A 09/16/2018   Procedure: PLACEMENT OF SPINAL CORD STIMULATOR BATTERY;  Surgeon: Deetta Perla, MD;  Location: [facility];  Service: Neurosurgery;  Laterality: N/A;    Current Meds  Medication Sig   acetaminophen (TYLENOL) 500 MG tablet Take 1 tablet (500 mg total) by mouth 2 (two) times daily as needed for moderate pain.   amLODipine (NORVASC) 5 MG tablet TAKE 1 TABLET(5 MG) BY MOUTH DAILY   aspirin EC 81 MG tablet Take 1 tablet (81 mg total) by mouth daily.   Cholecalciferol (VITAMIN D3) 1000 units CAPS Take 1  capsule (1,000 Units total) by mouth daily.   ciclopirox (PENLAC) 8 % solution Apply topically at bedtime. Apply over nail and surrounding skin. Apply daily over previous coat. After seven (7) days, may remove with alcohol and continue cycle. (Patient taking differently: Apply 1 application topically at bedtime as needed. Apply over nail and surrounding skin. Apply daily over previous coat. After seven (7) days, may remove with alcohol and continue cycle.)   clopidogrel (PLAVIX) 75 MG tablet TAKE 1 TABLET(75 MG) BY MOUTH DAILY AT 6 AM   diphenhydramine-acetaminophen (TYLENOL PM) 25-500 MG TABS tablet Take 2 tablets by mouth at bedtime.   docusate sodium (COLACE) 100 MG capsule Take 100 mg by mouth daily.   ezetimibe (ZETIA) 10 MG tablet TAKE 1 TABLET(10 MG) BY MOUTH DAILY   finasteride (PROSCAR) 5 MG tablet TAKE 1 TABLET(5 MG) BY MOUTH DAILY   fluocinonide (LIDEX) 0.05 % external solution Apply 1 application topically 2 (two) times daily as needed. (Patient taking differently: Apply 1 application topically daily as needed (irritation).)   furosemide (LASIX) 40 MG tablet TAKE 1 TABLET BY MOUTH EVERY DAY. TAKE AN EXTRA TABLET AS NEEDED FOR WEIGHT GAIN OF 3 LBS/DAY OR 5LBS/WEEK   gabapentin (NEURONTIN) 300 MG capsule TAKE 2 CAPSULES BY MOUTH AT BEDTIME. WITH EXTRA CAPSULE DURING THE DAY AS NEEDED (Patient taking differently: Take 600 mg by mouth at bedtime.)   guaiFENesin-codeine (CHERATUSSIN AC) 100-10 MG/5ML syrup Take 5 mLs by mouth 2 (two) times daily as needed for cough (do not mix with hydrocodone).   HYDROcodone-acetaminophen (NORCO) 10-325 MG tablet Take 1 tablet by mouth every 8 (eight) hours as needed. (Patient taking differently: Take 1 tablet by mouth every 8 (eight) hours as needed for severe pain.)   icosapent Ethyl (VASCEPA) 1 g capsule TAKE 2 CAPSULES(2 GRAMS) BY MOUTH TWICE DAILY   ketoconazole (NIZORAL) 2 % cream Apply to the feet and face QHS. (Patient taking differently: Apply 1  application topically See admin instructions. Apply to the feet and face QHS as needed)   ketoconazole (NIZORAL) 2 % shampoo APPLY TOPICALLY 3 TIMES WEEKLY AS DIRECTED   levothyroxine (SYNTHROID) 150 MCG tablet Take 1 tablet (150 mcg total) by mouth daily before breakfast.   LINZESS 290 MCG CAPS capsule TAKE 1 CAPSULE(290 MCG) BY MOUTH DAILY 30 MINUTES BEFORE FIRST MEAL OF THE DAY ON AN EMPTY STOMACH   lisinopril (ZESTRIL) 10 MG tablet TAKE 1 TABLET(10 MG) BY MOUTH DAILY   Melatonin 10 MG CAPS Take 10 mg by mouth at bedtime.   mometasone (ELOCON) 0.1 % lotion Apply to scalp QHS on Monday, Wednesday, and Friday. (Patient taking differently: Apply 1  application topically every Monday, Wednesday, and Friday. Apply to scalp QHS on Monday, Wednesday, and Friday.)   Multiple Vitamins-Minerals (CENTRUM SILVER PO) Take 1 tablet by mouth daily.   Multiple Vitamins-Minerals (PRESERVISION AREDS 2 PO) Take 1 tablet by mouth daily.   nitroGLYCERIN (NITROSTAT) 0.4 MG SL tablet Place 1 tablet (0.4 mg total) under the tongue every 5 (five) minutes as needed for chest pain.   PRALUENT 75 MG/ML SOAJ INJECT 1 PEN INTO THE SKIN EVERY 14 DAYS   senna (SENOKOT) 8.6 MG tablet Take 1 tablet (8.6 mg total) by mouth daily.   tiZANidine (ZANAFLEX) 2 MG tablet TAKE 1 TO 2 TABLETS(2 TO 4 MG) BY MOUTH TWICE DAILY AS NEEDED FOR MUSCLE SPASMS   traZODone (DESYREL) 50 MG tablet Take 0.5-1 tablets (25-50 mg total) by mouth at bedtime as needed for sleep.    Allergies  Allergen Reactions   Questran [Cholestyramine]     Patient not aware of an allergy to this medicine.   Atorvastatin     Muscle pain   Gemfibrozil Other (See Comments)    Muscle pain.   Metformin And Related     Dizziness   Rosuvastatin     Muscle pain      Review of Systems negative except from HPI and PMH  Physical Exam BP (!) 132/92 (BP Location: Left Arm, Patient Position: Supine, Cuff Size: Normal)   Pulse 90   Ht 5' 11.5" (1.816 m)   Wt 268 lb  (121.6 kg)   SpO2 94%   BMI 36.86 kg/m  Well developed and well nourished in no acute distress HENT normal Neck supple with JVP-flat Clear Device pocket well healed; without hematoma or erythema.  There is no tethering  Regular rate and rhythm, 2/6 murmur Abd-soft with active BS No Clubbing cyanosis  edema Skin-warm and dry A & Oriented  Grossly normal sensory and motor function  ECG sinus at 90 20/10/35 Left axis deviation   Assessment and Plan:  Atrial flutter-typical  S/p ablation  HFpEF  Statin intolerance  Renal insufficiency grade 3  Hyperlipidemia  Coronary artery disease with prior stenting  Morbid obesity  Abdominal aortic aneurysm     No angina  continue  Euvolemic cotninue furosemide 40 daily  No palps  Blood pressure well controlled.  We will continue him on amlodipine 5 and lisinopril 10.  He remains on DAPT.  We will need to explore the indication for ongoing DAPT  His last TSH level was low.  He does not recall adjustment but looking at the [facility] he went from 175--150s.  We will recheck his TSH today.         Virl Axe

## 2021-05-31 NOTE — Patient Instructions (Signed)
Medication Instructions:  - Your physician recommends that you continue on your current medications as directed. Please refer to the Current Medication list given to you today.  *If you need a refill on your cardiac medications before your next appointment, please call your pharmacy*   Lab Work: - Your physician recommends that you have lab work today: TSH  If you have labs (blood work) drawn today and your tests are completely normal, you will receive your results only by: MyChart Message (if you have MyChart) OR A paper copy in the mail If you have any lab test that is abnormal or we need to change your treatment, we will call you to review the results.   Testing/Procedures: - none ordered   Follow-Up: At [facility], you and your health needs are our priority.  As part of our continuing mission to provide you with exceptional heart care, we have created designated Provider Care Teams.  These Care Teams include your primary Cardiologist (physician) and Advanced Practice Providers (APPs -  Physician Assistants and Nurse Practitioners) who all work together to provide you with the care you need, when you need it.  We recommend signing up for the patient portal called "MyChart".  Sign up information is provided on this After Visit Summary.  MyChart is used to connect with patients for Virtual Visits (Telemedicine).  Patients are able to view lab/test results, encounter notes, upcoming appointments, etc.  Non-urgent messages can be sent to your provider as well.   To learn more about what you can do with MyChart, go to [email or web address].    Your next appointment:   1 year(s)  The format for your next appointment:   In Person  Provider:   Virl Axe, MD   Other Instructions N/a

## 2021-06-01 LAB — TSH: TSH: 4.72 u[IU]/mL — ABNORMAL HIGH (ref 0.450–4.500)

## 2021-06-14 NOTE — Telephone Encounter (Addendum)
Anthony [name],   I am not sure how to fax records using [employer].  Can you assist me in faxing the old Sleep Study results to [place] [facility]? Fax [phone]  We are needing to fax to Sleep Study from 09/21/2003 and the Most recent CPAP reading which scanned in media 2019

## 2021-06-15 ENCOUNTER — Other Ambulatory Visit: Payer: Self-pay | Admitting: Family Medicine

## 2021-06-15 NOTE — Telephone Encounter (Signed)
Gabapentin Last filled:  04/14/21, #200 Last OV:  04/06/21, 3 mo f/u Next OV:  none

## 2021-06-19 ENCOUNTER — Other Ambulatory Visit: Payer: Self-pay

## 2021-06-20 MED ORDER — ICOSAPENT ETHYL 1 G PO CAPS: 2.0000 g | ORAL_CAPSULE | Freq: Two times a day (BID) | ORAL | 0 refills | Status: DC

## 2021-06-20 NOTE — Telephone Encounter (Signed)
Anthony [name],   This is what is needing to be faxed    We are needing to fax to Sleep Study from 09/21/2003 and the Most recent CPAP reading which scanned in media 2019 - both are scanned in media  Please fax to Fax [phone]  Thanks!

## 2021-06-20 NOTE — Telephone Encounter (Signed)
Faxed to number requested.

## 2021-06-21 DIAGNOSIS — Z20822 Contact with and (suspected) exposure to covid-19: Secondary | ICD-10-CM | POA: Diagnosis not present

## 2021-06-23 ENCOUNTER — Other Ambulatory Visit: Payer: Medicare Other

## 2021-06-23 ENCOUNTER — Ambulatory Visit: Payer: Medicare Other | Attending: Neurology

## 2021-06-23 DIAGNOSIS — G4733 Obstructive sleep apnea (adult) (pediatric): Secondary | ICD-10-CM | POA: Diagnosis not present

## 2021-06-24 ENCOUNTER — Other Ambulatory Visit: Payer: Self-pay

## 2021-07-01 ENCOUNTER — Encounter: Payer: Self-pay | Admitting: Family Medicine

## 2021-07-01 DIAGNOSIS — G4733 Obstructive sleep apnea (adult) (pediatric): Secondary | ICD-10-CM

## 2021-07-04 NOTE — Telephone Encounter (Signed)
See pt message. Sleep study printed along with CPAP Rx and placed in [name]'s box.

## 2021-07-04 NOTE — Telephone Encounter (Signed)
Faxed rx and sleep study to [facility] at [phone], per pt's request.

## 2021-07-06 ENCOUNTER — Institutional Professional Consult (permissible substitution): Payer: Medicare Other | Admitting: Neurology

## 2021-07-06 ENCOUNTER — Other Ambulatory Visit: Payer: Self-pay

## 2021-07-06 MED ORDER — TRAZODONE HCL 50 MG PO TABS: 25.0000 mg | ORAL_TABLET | Freq: Every evening | ORAL | 5 refills | Status: DC | PRN

## 2021-07-15 DIAGNOSIS — Z23 Encounter for immunization: Secondary | ICD-10-CM | POA: Diagnosis not present

## 2021-07-25 DIAGNOSIS — M5416 Radiculopathy, lumbar region: Secondary | ICD-10-CM | POA: Diagnosis not present

## 2021-08-01 ENCOUNTER — Telehealth: Payer: Self-pay | Admitting: Internal Medicine

## 2021-08-01 NOTE — Telephone Encounter (Signed)
I called and spoke with the patient. I inquired if he could explain to me again, what he has been experiencing with his neck.  Per the patient, for the last couple of days, he has been noticing some "pain" to the neck, jaw area just while doing sit ups.  He has not experienced this any other time, nor does he have any additional symptoms of dizziness/ lightheadedness with/ without exercise.  The patient was concerned as his father had a history of carotid disease and had to have what sounds to be a carotid endarterectomy.   I advised the patient that with carotid stenosis, you will not have actual symptoms of actual neck pain. He is advised that his symptoms sound to be more musculosketal in nature.   He advised he is "inclined to agree" with me, but would like Dr. Olin Pia input on this.   I advised I will review with him tomorrow and try to call him back tomorrow afternoon.  The patient voices understanding and is agreeable.  I do see documentation in his chart that he has had a carotid US done 09/11/16 in [place].   Right ICA: 20-49 % stenosis Left ICA: 20-49 % stenosis

## 2021-08-01 NOTE — Telephone Encounter (Signed)
Patient walk in complaining of next pain

## 2021-08-01 NOTE — Telephone Encounter (Signed)
Patient walked in to the office this morning requesting to be seen by Dr. Caryl Comes today for right sided neck pain. Patient was told that Dr. Caryl Comes is not in the office today. Patient requested that the front desk add him on to Dr. Olin Pia schedule to be seen tomorrow.  Triage RN went out to the lobby to speak with the patient. Patient sts that he would like to be evaluated for right sided neck pain. The pain occurs only when he does his morning exercise of sit ups. He describes the pain as dull and non radiating. This is new for him and did not have this complaint when he was seen in Oct.  His father had carotid disease and had to have 2  endarterectomies. He is concerned that he may have the same issue. Patient denies light headiness, dizziness, change in vision or speech. Adv the patient that carotid disease should not cause pain or discomfort.  Offered the patient an appt with an APP and he declined. Patient sts that he is a good friend of Dr. Olin Pia and knows that if he is updated will work him in to be seen. Patient sts that he needs a "scan" of his neck. He denies ever having a carotid dopp.  Mr. Bramhall is pleasant and in no distress. Adv the pt that I will fwd a message to Dr. Olin Pia nurse Nira Conn, RN to f/u with him.  Provided the patient one of Dr. Olin Pia cards and adv him to call the office in the interim if needed.  Patient voiced appreciation for the assistance.

## 2021-08-02 NOTE — Telephone Encounter (Signed)
I called and spoke with the patient regarding Dr. Olin Pia feedback/ recommendations. The patient voices understanding and is agreeable.   He was very appreciative of the call back.

## 2021-08-02 NOTE — Telephone Encounter (Signed)
Deboraha Sprang, MD [phone])  Sent: Mon August 01, 2021  5:13 PM  To: Emily Filbert, RN          Message  Agree that seems musculoskeletal assoc with situps  Does  he want [name] to touch base with his son, Timmothy Sours MD  Thanks SK

## 2021-08-03 ENCOUNTER — Encounter: Payer: Self-pay | Admitting: Family Medicine

## 2021-08-03 ENCOUNTER — Other Ambulatory Visit: Payer: Self-pay | Admitting: Family Medicine

## 2021-08-03 NOTE — Telephone Encounter (Signed)
Name of Medication: Hydrocodone-APAP Name of Pharmacy: [facility] or Written Date and Quantity: 12/02/20, #20 Last Office Visit and Type: 04/06/21, 3 mo f/u Next Office Visit and Type: none Last Controlled Substance Agreement Date: 07/04/19 Last UDS: 07/04/19

## 2021-08-04 ENCOUNTER — Telehealth: Payer: Self-pay | Admitting: Family Medicine

## 2021-08-04 MED ORDER — HYDROCODONE-ACETAMINOPHEN 10-325 MG PO TABS
1.0000 | ORAL_TABLET | Freq: Three times a day (TID) | ORAL | 0 refills | Status: DC | PRN
Start: 2021-08-04 — End: 2021-10-12

## 2021-08-04 NOTE — Telephone Encounter (Signed)
Elizabeth from [facility] call wants to know did pt have a sleep study done . Pt wants to get a CPAP machine  #[phone]

## 2021-08-04 NOTE — Telephone Encounter (Signed)
ERx 

## 2021-08-05 DIAGNOSIS — M25571 Pain in right ankle and joints of right foot: Secondary | ICD-10-CM | POA: Diagnosis not present

## 2021-08-05 DIAGNOSIS — M545 Low back pain, unspecified: Secondary | ICD-10-CM | POA: Diagnosis not present

## 2021-08-05 DIAGNOSIS — M5416 Radiculopathy, lumbar region: Secondary | ICD-10-CM | POA: Diagnosis not present

## 2021-08-05 NOTE — Telephone Encounter (Signed)
Spoke with Benjamine Mola of [facility].  States the sleep study we faxed is hung up at another office. She's requesting sleep study notes be faxed again, this time to [phone], attn: Respiratory.  Printed and re-faxed report.

## 2021-08-08 ENCOUNTER — Telehealth: Payer: Self-pay

## 2021-08-08 DIAGNOSIS — Z20822 Contact with and (suspected) exposure to covid-19: Secondary | ICD-10-CM | POA: Diagnosis not present

## 2021-08-08 NOTE — Progress Notes (Addendum)
Chronic Care Management Pharmacy Assistant   Name: Anthony [name]  MRN: [id number] DOB: [birth year]  Reason for Encounter: CCM (Hypertension Disease State)   Recent office visits:  07/01/2021 - Ria Bush, MD - Patient Message - Ordered: CPAP device.  04/29/2021 - Ria Bush, MD - Patient Message - Referral for sleep study to [facility].  04/06/2021 - Ria Bush, MD - Patient presented for follow up for chronic midline low back pain. Start: senna (SENOKOT) 8.6 MG tablet due to constipation.  03/22/2021 - Ria Bush, MD - Telephone - Patient is refusing new sleep study as his current study is expired. Patient states he will stop using CPAP.  03/10/2021 - Ria Bush, MD - Telephone - Referral to Neurology for sleep study.   Recent consult visits:  06/23/2021 - Sleep Study - Patient presented for sleep study.  05/31/2021 - Cardiology - Patient presented for follow up for typical atrial flutter. Labs: TSH and EKG. No medication changes.  03/01/2021 - Neurosurgery - Patient presented for evaluation of a failed spinal cord stimulator in terms of this is no longer controlling any pain. Planned surgery: removal of spinal cord stimulator battery (removal of battery only). Surgery date: 03/21/21.   Hospital visits:  None in previous 6 months  Medications: Outpatient Encounter Medications as of 08/08/2021  Medication Sig   HYDROcodone-acetaminophen (NORCO) 10-325 MG tablet Take 1 tablet by mouth every 8 (eight) hours as needed.   acetaminophen (TYLENOL) 500 MG tablet Take 1 tablet (500 mg total) by mouth 2 (two) times daily as needed for moderate pain.   amLODipine (NORVASC) 5 MG tablet TAKE 1 TABLET(5 MG) BY MOUTH DAILY   aspirin EC 81 MG tablet Take 1 tablet (81 mg total) by mouth daily.   Cholecalciferol (VITAMIN D3) 1000 units CAPS Take 1 capsule (1,000 Units total) by mouth daily.   ciclopirox (PENLAC) 8 % solution Apply topically at bedtime. Apply over nail and  surrounding skin. Apply daily over previous coat. After seven (7) days, may remove with alcohol and continue cycle. (Patient taking differently: Apply 1 application topically at bedtime as needed. Apply over nail and surrounding skin. Apply daily over previous coat. After seven (7) days, may remove with alcohol and continue cycle.)   clopidogrel (PLAVIX) 75 MG tablet TAKE 1 TABLET(75 MG) BY MOUTH DAILY AT 6 AM   diphenhydramine-acetaminophen (TYLENOL PM) 25-500 MG TABS tablet Take 2 tablets by mouth at bedtime.   docusate sodium (COLACE) 100 MG capsule Take 100 mg by mouth daily.   ezetimibe (ZETIA) 10 MG tablet TAKE 1 TABLET(10 MG) BY MOUTH DAILY   finasteride (PROSCAR) 5 MG tablet TAKE 1 TABLET(5 MG) BY MOUTH DAILY   fluocinonide (LIDEX) 0.05 % external solution Apply 1 application topically 2 (two) times daily as needed. (Patient taking differently: Apply 1 application topically daily as needed (irritation).)   furosemide (LASIX) 40 MG tablet TAKE 1 TABLET BY MOUTH EVERY DAY. TAKE AN EXTRA TABLET AS NEEDED FOR WEIGHT GAIN OF 3 LBS/DAY OR 5LBS/WEEK   gabapentin (NEURONTIN) 300 MG capsule TAKE 2 CAPSULE BY MOUTH AT BEDTIME WITH EXTRA CAPSULE DURING THE DAY AS NEEDED   guaiFENesin-codeine (CHERATUSSIN AC) 100-10 MG/5ML syrup Take 5 mLs by mouth 2 (two) times daily as needed for cough (do not mix with hydrocodone).   icosapent Ethyl (VASCEPA) 1 g capsule Take 2 capsules (2 g total) by mouth 2 (two) times daily.   ketoconazole (NIZORAL) 2 % cream Apply to the feet and face QHS. (Patient taking  differently: Apply 1 application topically See admin instructions. Apply to the feet and face QHS as needed)   ketoconazole (NIZORAL) 2 % shampoo APPLY TOPICALLY 3 TIMES WEEKLY AS DIRECTED   levothyroxine (SYNTHROID) 150 MCG tablet Take 1 tablet (150 mcg total) by mouth daily before breakfast.   LINZESS 290 MCG CAPS capsule TAKE 1 CAPSULE(290 MCG) BY MOUTH DAILY 30 MINUTES BEFORE FIRST MEAL OF THE DAY ON AN EMPTY  STOMACH   lisinopril (ZESTRIL) 10 MG tablet TAKE 1 TABLET(10 MG) BY MOUTH DAILY   Melatonin 10 MG CAPS Take 10 mg by mouth at bedtime.   mometasone (ELOCON) 0.1 % lotion Apply to scalp QHS on Monday, Wednesday, and Friday. (Patient taking differently: Apply 1 application topically every Monday, Wednesday, and Friday. Apply to scalp QHS on Monday, Wednesday, and Friday.)   Multiple Vitamins-Minerals (CENTRUM SILVER PO) Take 1 tablet by mouth daily.   Multiple Vitamins-Minerals (PRESERVISION AREDS 2 PO) Take 1 tablet by mouth daily.   nitroGLYCERIN (NITROSTAT) 0.4 MG SL tablet Place 1 tablet (0.4 mg total) under the tongue every 5 (five) minutes as needed for chest pain.   PRALUENT 75 MG/ML SOAJ INJECT 1 PEN INTO THE SKIN EVERY 14 DAYS   senna (SENOKOT) 8.6 MG tablet Take 1 tablet (8.6 mg total) by mouth daily.   tiZANidine (ZANAFLEX) 2 MG tablet TAKE 1 TO 2 TABLETS(2 TO 4 MG) BY MOUTH TWICE DAILY AS NEEDED FOR MUSCLE SPASMS   traZODone (DESYREL) 50 MG tablet Take 0.5-1 tablets (25-50 mg total) by mouth at bedtime as needed for sleep.   No facility-administered encounter medications on file as of 08/08/2021.   Recent Office Vitals: BP Readings from Last 3 Encounters:  05/31/21 (!) 132/92  04/06/21 (!) 144/90  12/31/20 (!) 141/78   Pulse Readings from Last 3 Encounters:  05/31/21 90  04/06/21 85  12/31/20 71    Wt Readings from Last 3 Encounters:  05/31/21 268 lb (121.6 kg)  04/06/21 270 lb 5 oz (122.6 kg)  12/31/20 281 lb (127.5 kg)    Kidney Function Lab Results  Component Value Date/Time   CREATININE 1.44 (H) 12/31/2020 11:50 AM   CREATININE 1.24 12/16/2020 08:52 AM   GFR 53.89 (L) 12/16/2020 08:52 AM   GFRNONAA 48 (L) 12/31/2020 11:50 AM   GFRAA 45 (L) 05/06/2020 06:35 AM    BMP Latest Ref Rng & Units 12/31/2020 12/16/2020 09/28/2020  Glucose 70 - 99 mg/dL 129(H) 112(H) 105(H)  BUN 8 - 23 mg/dL 26(H) 25(H) 36(H)  Creatinine 0.61 - 1.24 mg/dL 1.44(H) 1.24 1.70(H)  BUN/Creat  Ratio 10 - 24 - - -  Sodium 135 - 145 mmol/L 139 140 136  Potassium 3.5 - 5.1 mmol/L 4.0 4.3 4.1  Chloride 98 - 111 mmol/L 103 105 103  CO2 22 - 32 mmol/L 26 29 27   Calcium 8.9 - 10.3 mg/dL 9.6 10.7(H) 10.1   Contacted patient on 08/08/2021 to discuss hypertension disease state. I spoke with patient's son Timmothy Sours) for the encounter.   Current antihypertensive regimen:  Amlodipine 5 mg - 1 tablet daily Lisinopril 10 mg - 1 tablet daily   Patient verbally confirms he is taking the above medications as directed. Yes  How often are you checking your Blood Pressure? Son randomly takes the patient's blood pressure. Patient's son states it is usually running 130/80. I asked for the patient to take his blood pressure two times this week and I would call back on 12/16 for the readings. Patient's son agreed.  Wrist or arm cuff: Arm  Caffeine intake: Drinks coke - 1 can a day Salt intake: Does not add salt to food. OTC medications including pseudoephedrine or NSAIDs? Tylenol as needed   Any readings above 180/120? No  What recent interventions/DTPs have been made by any provider to improve Blood Pressure control since last CPP Visit: No recent interventions; continue current medication.   Any recent hospitalizations or ED visits since last visit with CPP? No  What diet changes have been made to improve Blood Pressure Control?   Patient's son stated the patient will go weeks eating at [employer] every morning and then he will stop and go for a period where he does not go. Patient is currently eating at [employer] daily - hashbrown casserole, eggs, bacon, 1 biscuit/gravy.  What exercise is being done to improve your Blood Pressure Control?  Patient's son stated the patient goes to mall and walks every day.   Adherence Review: Is the patient currently on ACE/ARB medication? Yes Does the patient have >5 day gap between last estimated fill dates? No  Star Rating Drugs:  Medication:  Last  Fill: Day Supply Lisinopril 10 mg 07/14/2021 90  Care Gaps: Annual wellness visit in last year? Yes 12/15/2020  Most Recent BP reading: 132/92 on 05/31/2021  No appointments scheduled within the next 30 days.  Debbora Dus, CPP notified  Marijean Niemann, [place] Clinical Pharmacy Assistant [phone]  I have reviewed the care management and care coordination activities outlined in this encounter and I am certifying that I agree with the content of this note. No further action required.  Debbora Dus, PharmD Clinical Pharmacist [facility] at [facility] [phone]

## 2021-08-10 ENCOUNTER — Other Ambulatory Visit: Payer: Self-pay

## 2021-08-10 MED ORDER — TIZANIDINE HCL 2 MG PO TABS: ORAL_TABLET | ORAL | 0 refills | Status: DC

## 2021-08-10 NOTE — Telephone Encounter (Signed)
Tizanidine last filled on 04/28/21 #30 with 0 refill Last OV:  04/06/21, chronic pain f/u No future appointments with provider.

## 2021-08-12 NOTE — Progress Notes (Addendum)
Called patient for his blood pressure readings. Spoke with patient's son who had taken the patient's blood pressure for the past two days. Patient's son said the patient was still taking Prednisone and they usually see his blood pressure go higher while on it.  Date  Blood Pressure 12/15  134/90 12/14  136/88  Debbora Dus, CPP notified  Marijean Niemann, [place] Pharmacy Assistant [phone]  Time Spent:  5 Minutes  I have reviewed the care management and care coordination activities outlined in this encounter and I am certifying that I agree with the content of this note. No further action required.  Debbora Dus, PharmD Clinical Pharmacist [facility] at [facility] [phone]

## 2021-08-15 DIAGNOSIS — M79604 Pain in right leg: Secondary | ICD-10-CM | POA: Diagnosis not present

## 2021-08-15 DIAGNOSIS — M5416 Radiculopathy, lumbar region: Secondary | ICD-10-CM | POA: Diagnosis not present

## 2021-08-15 DIAGNOSIS — M545 Low back pain, unspecified: Secondary | ICD-10-CM | POA: Diagnosis not present

## 2021-08-29 ENCOUNTER — Encounter: Payer: Self-pay | Admitting: Family Medicine

## 2021-08-30 MED ORDER — TAMSULOSIN HCL 0.4 MG PO CAPS: 0.4000 mg | ORAL_CAPSULE | Freq: Every day | ORAL | 3 refills | Status: DC

## 2021-09-02 DIAGNOSIS — M79604 Pain in right leg: Secondary | ICD-10-CM | POA: Diagnosis not present

## 2021-09-02 DIAGNOSIS — M545 Low back pain, unspecified: Secondary | ICD-10-CM | POA: Diagnosis not present

## 2021-09-02 DIAGNOSIS — M25562 Pain in left knee: Secondary | ICD-10-CM | POA: Diagnosis not present

## 2021-09-02 DIAGNOSIS — M5416 Radiculopathy, lumbar region: Secondary | ICD-10-CM | POA: Diagnosis not present

## 2021-09-06 DIAGNOSIS — G8929 Other chronic pain: Secondary | ICD-10-CM | POA: Diagnosis not present

## 2021-09-06 DIAGNOSIS — M5416 Radiculopathy, lumbar region: Secondary | ICD-10-CM | POA: Diagnosis not present

## 2021-09-06 DIAGNOSIS — M4316 Spondylolisthesis, lumbar region: Secondary | ICD-10-CM | POA: Diagnosis not present

## 2021-09-06 DIAGNOSIS — M47896 Other spondylosis, lumbar region: Secondary | ICD-10-CM | POA: Diagnosis not present

## 2021-09-06 DIAGNOSIS — M545 Low back pain, unspecified: Secondary | ICD-10-CM | POA: Diagnosis not present

## 2021-09-09 ENCOUNTER — Encounter: Payer: Self-pay | Admitting: Family Medicine

## 2021-09-12 ENCOUNTER — Telehealth (INDEPENDENT_AMBULATORY_CARE_PROVIDER_SITE_OTHER): Payer: Medicare Other | Admitting: Family Medicine

## 2021-09-12 ENCOUNTER — Other Ambulatory Visit: Payer: Self-pay

## 2021-09-12 ENCOUNTER — Encounter: Payer: Self-pay | Admitting: Family Medicine

## 2021-09-12 VITALS — BP 134/96 | HR 88 | Ht 71.5 in | Wt 270.0 lb

## 2021-09-12 DIAGNOSIS — G4733 Obstructive sleep apnea (adult) (pediatric): Secondary | ICD-10-CM | POA: Diagnosis not present

## 2021-09-12 DIAGNOSIS — R2681 Unsteadiness on feet: Secondary | ICD-10-CM

## 2021-09-12 DIAGNOSIS — F5104 Psychophysiologic insomnia: Secondary | ICD-10-CM | POA: Diagnosis not present

## 2021-09-12 DIAGNOSIS — Z9989 Dependence on other enabling machines and devices: Secondary | ICD-10-CM

## 2021-09-12 DIAGNOSIS — N1831 Chronic kidney disease, stage 3a: Secondary | ICD-10-CM | POA: Diagnosis not present

## 2021-09-12 MED ORDER — ICOSAPENT ETHYL 1 G PO CAPS: 2.0000 g | ORAL_CAPSULE | Freq: Two times a day (BID) | ORAL | 3 refills | Status: DC

## 2021-09-12 NOTE — Assessment & Plan Note (Signed)
Reviewed sleep study from 05/2021.  He remains compliant with CPAP machine and continues receiving benefit from its use.  He needs new machine/mask/supplies as old one has broken.  Will fax office visit to requested DME company.

## 2021-09-12 NOTE — Assessment & Plan Note (Signed)
He is now on daily NSAID through ortho - rec recheck kidney function to ensure kidneys are tolerating this.

## 2021-09-12 NOTE — Assessment & Plan Note (Signed)
Reviewed current medications that could cause this side effect including tylenol PM and gabapentin - anticipate multifactorial including spinal stenosis and polypharmacy. Previously may have been worse when on higher flomax dose.

## 2021-09-12 NOTE — Progress Notes (Signed)
Patient ID: Anthony [name], male    DOB: January 30, 1936, 86 y.o.   MRN: [id number]  Virtual visit completed through [place], a video enabled telemedicine application. Due to national recommendations of social distancing due to COVID-19, a virtual visit is felt to be most appropriate for this patient at this time. Reviewed limitations, risks, security and privacy concerns of performing a virtual visit and the availability of in person appointments. I also reviewed that there may be a patient responsible charge related to this service. The patient agreed to proceed.   Patient location: home Provider location: home Persons participating in this virtual visit: patient, provider   If any vitals were documented, they were collected by patient at home unless specified below.    BP (!) 134/96    Pulse 88    Ht 5' 11.5" (1.816 m)    Wt 270 lb (122.5 kg)    BMI 37.13 kg/m    CC: OSA Subjective:   HPI: Anthony [name] is a 86 y.o. male presenting on 09/12/2021 for Sleep Apnea (Needs office note indicating use and benefits of pt using CPAP and needs to continue use.)   Here to discuss OSA treated with CPAP. He regularly uses and benefits from CPAP machine and needs a replacement due to age of current machine. His latest CPAP machine broke so he's using 84 year old machine. Insurance required rpt sleep study first.   Previous auto-PAP titration 6-14, starting pressure at 5cm and EPR of 3.  DME supply company is [facility] fax [phone].   Adult polysomnography test completed at [facility] 06/23/2021 (Dr Richardson Landry) - dx mild OSA with AHI 9.2/hr, RDI 24.7/hr, O2 sat nadir of 74%. Multifocal PVCs and occasional PACs also noted. Elevated PLMS index of 112.8/hr - consider re evaluating once OSA controlled.   Recommendations: weight loss, avoid supine sleeping position, address reversible sources of increased upper airway resistance. Recommend also return to sleep lab for PAP titration.   He declines  return to sleep lab for titration study.  Leg movements do not wake him up from sleep. Endorses RLS symptoms worse while on steroids, now better since he's completed steroid course. Is now taking meloxicam 15mg  daily for the past week with benefit. This is through ortho.   He continues gabapentin 600mg  nightly with extra 300mg  during day as needed. Can take up to 6-8 capsules at night.  He continues trazodone 50mg  nightly as needed for sleep.  He also takes flomax nightly.   He is taking lasix 40mg  daily.  He rarely takes lunesa (last filled 11/2020).  He regularly takes 2 tylenol PM to sleep.      Relevant past medical, surgical, family and social history reviewed and updated as indicated. Interim medical history since our last visit reviewed. Allergies and medications reviewed and updated. Outpatient Medications Prior to Visit  Medication Sig Dispense Refill   acetaminophen (TYLENOL) 500 MG tablet Take 1 tablet (500 mg total) by mouth 2 (two) times daily as needed for moderate pain.     amLODipine (NORVASC) 5 MG tablet TAKE 1 TABLET(5 MG) BY MOUTH DAILY 90 tablet 1   aspirin EC 81 MG tablet Take 1 tablet (81 mg total) by mouth daily.     Cholecalciferol (VITAMIN D3) 1000 units CAPS Take 1 capsule (1,000 Units total) by mouth daily. 30 capsule    ciclopirox (PENLAC) 8 % solution Apply topically at bedtime. Apply over nail and surrounding skin. Apply daily over previous coat. After  seven (7) days, may remove with alcohol and continue cycle. (Patient taking differently: Apply 1 application topically at bedtime as needed. Apply over nail and surrounding skin. Apply daily over previous coat. After seven (7) days, may remove with alcohol and continue cycle.) 6.6 mL 0   clopidogrel (PLAVIX) 75 MG tablet TAKE 1 TABLET(75 MG) BY MOUTH DAILY AT 6 AM 90 tablet 3   diphenhydramine-acetaminophen (TYLENOL PM) 25-500 MG TABS tablet Take 2 tablets by mouth at bedtime.     docusate sodium (COLACE) 100 MG  capsule Take 100 mg by mouth daily.     ezetimibe (ZETIA) 10 MG tablet TAKE 1 TABLET(10 MG) BY MOUTH DAILY 90 tablet 3   finasteride (PROSCAR) 5 MG tablet TAKE 1 TABLET(5 MG) BY MOUTH DAILY 90 tablet 3   fluocinonide (LIDEX) 0.05 % external solution Apply 1 application topically 2 (two) times daily as needed. (Patient taking differently: Apply 1 application topically daily as needed (irritation).) 60 mL 2   furosemide (LASIX) 40 MG tablet TAKE 1 TABLET BY MOUTH EVERY DAY. TAKE AN EXTRA TABLET AS NEEDED FOR WEIGHT GAIN OF 3 LBS/DAY OR 5LBS/WEEK 135 tablet 3   gabapentin (NEURONTIN) 300 MG capsule TAKE 2 CAPSULE BY MOUTH AT BEDTIME WITH EXTRA CAPSULE DURING THE DAY AS NEEDED 200 capsule 1   guaiFENesin-codeine (CHERATUSSIN AC) 100-10 MG/5ML syrup Take 5 mLs by mouth 2 (two) times daily as needed for cough (do not mix with hydrocodone). 120 mL 0   HYDROcodone-acetaminophen (NORCO) 10-325 MG tablet Take 1 tablet by mouth every 8 (eight) hours as needed. 20 tablet 0   icosapent Ethyl (VASCEPA) 1 g capsule Take 2 capsules (2 g total) by mouth 2 (two) times daily. 360 capsule 3   ketoconazole (NIZORAL) 2 % cream Apply to the feet and face QHS. (Patient taking differently: Apply 1 application topically See admin instructions. Apply to the feet and face QHS as needed) 60 g 3   ketoconazole (NIZORAL) 2 % shampoo APPLY TOPICALLY 3 TIMES WEEKLY AS DIRECTED 120 mL 2   levothyroxine (SYNTHROID) 150 MCG tablet Take 1 tablet (150 mcg total) by mouth daily before breakfast. 90 tablet 3   LINZESS 290 MCG CAPS capsule TAKE 1 CAPSULE(290 MCG) BY MOUTH DAILY 30 MINUTES BEFORE FIRST MEAL OF THE DAY ON AN EMPTY STOMACH 90 capsule 1   lisinopril (ZESTRIL) 10 MG tablet TAKE 1 TABLET(10 MG) BY MOUTH DAILY 90 tablet 3   Melatonin 10 MG CAPS Take 10 mg by mouth at bedtime.     meloxicam (MOBIC) 15 MG tablet Take 1 tablet (15 mg total) by mouth daily.     mometasone (ELOCON) 0.1 % lotion Apply to scalp QHS on Monday, Wednesday,  and Friday. (Patient taking differently: Apply 1 application topically every Monday, Wednesday, and Friday. Apply to scalp QHS on Monday, Wednesday, and Friday.) 60 mL 3   Multiple Vitamins-Minerals (CENTRUM SILVER PO) Take 1 tablet by mouth daily.     Multiple Vitamins-Minerals (PRESERVISION AREDS 2 PO) Take 1 tablet by mouth daily.     nitroGLYCERIN (NITROSTAT) 0.4 MG SL tablet Place 1 tablet (0.4 mg total) under the tongue every 5 (five) minutes as needed for chest pain. 25 tablet 0   PRALUENT 75 MG/ML SOAJ INJECT 1 PEN INTO THE SKIN EVERY 14 DAYS 2 mL 11   senna (SENOKOT) 8.6 MG tablet Take 1 tablet (8.6 mg total) by mouth daily. 30 tablet 3   tamsulosin (FLOMAX) 0.4 MG CAPS capsule Take 1 capsule (0.4 mg  total) by mouth daily. 90 capsule 3   tiZANidine (ZANAFLEX) 2 MG tablet TAKE 1 TO 2 TABLETS(2 TO 4 MG) BY MOUTH TWICE DAILY AS NEEDED FOR MUSCLE SPASMS 30 tablet 0   traZODone (DESYREL) 50 MG tablet Take 0.5-1 tablets (25-50 mg total) by mouth at bedtime as needed for sleep. 30 tablet 5   Eszopiclone 3 MG TABS Take 1 tablet (3 mg total) by mouth at bedtime as needed. Use sparingly, take immediately before bedtime 30 tablet 0   traZODone (DESYREL) 50 MG tablet Take 1 tablet (50 mg total) by mouth at bedtime as needed for sleep.     No facility-administered medications prior to visit.     Per HPI unless specifically indicated in ROS section below Review of Systems Objective:  BP (!) 134/96    Pulse 88    Ht 5' 11.5" (1.816 m)    Wt 270 lb (122.5 kg)    BMI 37.13 kg/m   Wt Readings from Last 3 Encounters:  09/12/21 270 lb (122.5 kg)  05/31/21 268 lb (121.6 kg)  04/06/21 270 lb 5 oz (122.6 kg)       Physical exam: Gen: alert, NAD, not ill appearing Pulm: speaks in complete sentences without increased work of breathing Psych: normal mood, normal thought content      Results for orders placed or performed in visit on 05/31/21  TSH  Result Value Ref Range   TSH 4.720 (H) 0.450 -  4.500 uIU/mL   Assessment & Plan:   Problem List Items Addressed This Visit     OSA on CPAP - Primary    Reviewed sleep study from 05/2021.  He remains compliant with CPAP machine and continues receiving benefit from its use.  He needs new machine/mask/supplies as old one has broken.  Will fax office visit to requested DME company.       Chronic insomnia    Longstanding, manages with trazodone, tylenol PM, rare PRN lunesta.  Reviewed sedating medications along with other possible side effects      CKD (chronic kidney disease) stage 3, GFR 30-59 ml/min (HCC)    He is now on daily NSAID through ortho - rec recheck kidney function to ensure kidneys are tolerating this.      General unsteadiness    Reviewed current medications that could cause this side effect including tylenol PM and gabapentin - anticipate multifactorial including spinal stenosis and polypharmacy. Previously may have been worse when on higher flomax dose.         No orders of the defined types were placed in this encounter.  No orders of the defined types were placed in this encounter.   I discussed the assessment and treatment plan with the patient. The patient was provided an opportunity to ask questions and all were answered. The patient agreed with the plan and demonstrated an understanding of the instructions. The patient was advised to call back or seek an in-person evaluation if the symptoms worsen or if the condition fails to improve as anticipated.  Follow up plan: Return in about 3 months (around 12/11/2021) for follow up visit, medicare wellness visit.  Ria Bush, MD

## 2021-09-12 NOTE — Telephone Encounter (Signed)
This is a [name] pt 

## 2021-09-12 NOTE — Assessment & Plan Note (Signed)
Longstanding, manages with trazodone, tylenol PM, rare PRN lunesta.  Reviewed sedating medications along with other possible side effects

## 2021-09-13 NOTE — Telephone Encounter (Signed)
Printed OV and placed in [name]'s box to fax to DME supply company

## 2021-09-13 NOTE — Telephone Encounter (Addendum)
Faxed form and 09/12/21 OV notes to [facility] at [phone].

## 2021-09-17 ENCOUNTER — Other Ambulatory Visit: Payer: Self-pay | Admitting: Dermatology

## 2021-09-17 DIAGNOSIS — B353 Tinea pedis: Secondary | ICD-10-CM

## 2021-09-23 ENCOUNTER — Other Ambulatory Visit: Payer: Self-pay

## 2021-09-23 NOTE — Telephone Encounter (Signed)
Tizanidine Last filled:  08/10/21, #30 Last OV:  09/13/11, OSA f/u Next OV:  12/28/21, AWV prt 2

## 2021-09-24 MED ORDER — TIZANIDINE HCL 2 MG PO TABS: ORAL_TABLET | ORAL | 0 refills | Status: DC

## 2021-10-12 ENCOUNTER — Ambulatory Visit (INDEPENDENT_AMBULATORY_CARE_PROVIDER_SITE_OTHER): Payer: Medicare Other | Admitting: Family Medicine

## 2021-10-12 ENCOUNTER — Encounter: Payer: Self-pay | Admitting: Family Medicine

## 2021-10-12 ENCOUNTER — Other Ambulatory Visit: Payer: Self-pay

## 2021-10-12 VITALS — BP 124/62 | HR 67 | Temp 97.5°F | Ht 71.5 in | Wt 273.3 lb

## 2021-10-12 DIAGNOSIS — M48061 Spinal stenosis, lumbar region without neurogenic claudication: Secondary | ICD-10-CM

## 2021-10-12 DIAGNOSIS — G894 Chronic pain syndrome: Secondary | ICD-10-CM | POA: Diagnosis not present

## 2021-10-12 DIAGNOSIS — M549 Dorsalgia, unspecified: Secondary | ICD-10-CM

## 2021-10-12 DIAGNOSIS — G8929 Other chronic pain: Secondary | ICD-10-CM

## 2021-10-12 MED ORDER — PREDNISONE 20 MG PO TABS: ORAL_TABLET | ORAL | 0 refills | Status: DC

## 2021-10-12 MED ORDER — OXYCODONE-ACETAMINOPHEN 7.5-325 MG PO TABS: 1.0000 | ORAL_TABLET | Freq: Four times a day (QID) | ORAL | 0 refills | Status: DC | PRN

## 2021-10-12 NOTE — Progress Notes (Signed)
Anthony Guzek [name], MD, [place] at [facility] [place], [address]  Phone: [phone]   FAX: [phone]  Anthony [name] - 86 y.o. male   MRN [id number]   Date of Birth: 19-Oct-1935  Date: 10/12/2021   PCP: Ria Bush, MD   Referral: Ria Bush, MD  Chief Complaint  Patient presents with   Leg Pain    C/o bilateral leg pain, from buttocks to the feet. Occasionally feels tingling in feet.  Described as constant, dull pain. Started yrs ago, worsening.     This visit occurred during the SARS-CoV-2 public health emergency.  Safety protocols were in place, including screening questions prior to the visit, additional usage of staff PPE, and extensive cleaning of exam room while observing appropriate contact time as indicated for disinfecting solutions.   Subjective:   Anthony [name] is a 86 y.o. very pleasant male patient with Body mass index is 37.59 kg/m. who presents with the following:  Chronic B lumbar radiculopathy: Severe chronic back pain that is constant.  This has been present for years, and he is seen many other different physicians, even his recent was 1 month ago.  He is now in recalcitrant, severe pain in the buttocks as well as radicular symptoms bilaterally.  He is having trouble getting up and walking.  Multiple providers have been seeing including neurosurgery, orthopedic surgery, pain management over multiple years in different groups.  He has known spinal stenosis. 2020 laminectomy with spinal cord stimulator placement 2018 Lumbar radiofrequency ablation 2017 Spinal cord stimulator  02/2021 - Dr. Lacinda Axon, cancelled surgery for spinal cord stimulator removal Most recent office notes do indicate also some severe buttocks pain as well.  At that point, he felt as if his spinal cord stimulator was not working and he turned it off.  They had planned to remove this, but the patient canceled this.  He  would not be able to get an MRI of the spine due to hardware remaining.  Pain: Dr. Holley Raring, 2020 - last OV He has had multiple rounds of epidural steroid injections in the other attempts for pain management  09/06/2021, multiple other prior appointments, he has seen [facility] Most recently,  Given steroid dose pak Gabapentin Hydrocodone He has a prior pain management MD Spine surgeon in [place] -most recent appointment was July 2022 with Dr. Lacinda Axon.  History is also very significant for a AAA repair in November 2022, and he is on current Plavix.  08/2021, severe DDD and spondyloarthropathy -I am not able to review these, but on chart review orthopedic surgery remarks this.  December 2019 CT myelogram reviewed which shows very significant multilevel severe spinal stenosis and neural foraminal stenosis.  Nothing really different   Having some pain in the buttocks.  Pain all the way down to his feet.   L toe great extension is very low.  Review of Systems is noted in the HPI, as appropriate   Objective:   BP 124/62    Pulse 67    Temp (!) 97.5 F (36.4 C) (Temporal)    Ht 5' 11.5" (1.816 m)    Wt 273 lb 5 oz (124 kg)    SpO2 94%    BMI 37.59 kg/m    Range of motion at  the waist: Flexion, extension, lateral bending and rotation: Limited in all movements.  Roughly 40% loss.  No echymosis or edema Rises to examination table with mild difficulty  Gait: minimally antalgic  Inspection/Deformity: N Paraspinus Tenderness: L3-S1.  He also has buttocks pain.  B Ankle Dorsiflexion (L5,4): 5/5 B Great Toe Dorsiflexion (L5,4): 2/5 on the left, on the right 5/5 Plantar and dorsiflexion at the ankle are 5/5 bilaterally Rise/Squat (L4): WNL, mild pain  SENSORY B Medial Foot (L4): WNL B Dorsum (L5): WNL B Lateral (S1): WNL Light Touch: WNL Pinprick: WNL  REFLEXES Knee (L4): 2+ Ankle (S1): 2+  B SLR, seated: neg B SLR, supine: neg B FABER: neg B Reverse FABER:  neg B Greater Troch: NT B Log Roll: neg B Sciatic Notch: NT   Hip motion is grossly normal without induction of pain.  Radiology:   CT LUMBAR SPINE W CONTRAST (Accession [id number]) (Order [id number]) Imaging Date: 08/19/2018 Department: [facility] Released By: Cheri Rous, RT Authorizing: Deetta Perla, MD   Exam Status  Status  Final [99]   PACS Intelerad Image Link   Show images for CT LUMBAR SPINE W CONTRAST Study Result  Narrative & Impression  CLINICAL DATA:  Chronic back pain.   EXAM: CT LUMBAR SPINE WITH CONTRAST   TECHNIQUE: Multidetector CT imaging of the lumbar spine was performed with intravenous contrast administration.   CONTRAST:  Intrathecal contrast administered at myelography.   COMPARISON:  Myelogram images same day.   FINDINGS: Segmentation: 5 lumbar type vertebral bodies.   Alignment: Thoracolumbar curvature convex to the right. Lower lumbar curvature convex to the left. 3 mm retrolisthesis L2-3 and L3-4. 4 mm retrolisthesis L4-5. 5 mm anterolisthesis L5-S1 due to chronic pars defects.   Vertebrae: Chronic degenerative endplate changes B6-3 through L4-5. No fracture or primary bone lesion. Chronic pars defects at L5 as noted above. Chronic pedicle fracture on the left at L5.   Paraspinal and other soft tissues: Infrarenal abdominal aortic aneurysm, not completely imaged. Bilobed configuration with maximal diameter of at least 4.5 cm. Recommend followup by abdomen and pelvis CTA in 6 months, and vascular surgery referral/consultation if not already obtained. This recommendation follows ACR consensus guidelines: White Paper of the ACR Incidental Findings Committee II on Vascular Findings. J Am Coll Radiol 2013; 10:789-794.   Disc levels: L1-2: Disc degeneration with vacuum phenomenon. Circumferential protrusion more prominent towards the left. Facet and ligamentous hypertrophy worse on the left.  Multifactorial canal stenosis that could cause neural compression. Foraminal stenosis left worse than right that could cause neural compression, particularly of the left L1 nerve.   L2-3: 3 mm retrolisthesis. Chronic disc degeneration with loss of height. Endplate osteophytes and bulging of the disc. Small amount of herniated disc material to the right of midline showing caudal migration. Stenosis of the lateral recesses and foramina right more than left. Neural compression could occur at this level, particularly on the right.   L3-4: 3 mm retrolisthesis. Endplate osteophytes and minimal bulging disc material. Facet and ligamentous hypertrophy. Multifactorial stenosis of the canal and foramina that could cause neural compression on either or both sides.   L4-5: Retrolisthesis of 4 mm. Chronic disc degeneration with endplate osteophytes and bulging of the disc. Pronounced facet and ligamentous hypertrophy. Severe stenosis of the central canal, lateral recesses and neural foramina that could cause neural compression on either or both sides.   L5-S1: Chronic bilateral pars defects allowing anterolisthesis of 5 mm. Chronic pedicle fracture on the left. Disc degeneration and vacuum phenomenon. Circumferential bulging of the disc. Very severe spinal stenosis and neural foraminal stenosis at this level that could cause neural compression on  either or both sides.   IMPRESSION: Bilobed infrarenal abdominal aortic aneurysm, not primarily evaluated or completely evaluated. Maximal transverse diameter at least 4.5 cm. Recommend followup by abdomen and pelvis CTA in 6 months, and vascular surgery referral/consultation if not already obtained. This recommendation follows ACR consensus guidelines: White Paper of the ACR Incidental Findings Committee II on Vascular Findings. J Am Coll Radiol 2013; 10:789-794.   Advanced degenerative disease throughout the lumbar region as outlined above.  Multilevel severe spinal stenosis and neural foraminal stenosis with considerable potential for neural compression on either or both sides. Findings include chronic bilateral pars defects at L5 as well as a chronic pedicle fracture on the left at L5.     Electronically Signed   By: Nelson Chimes M.D.   On: 08/19/2018 10:41        Assessment and Plan:     ICD-10-CM   1. Spinal stenosis of lumbar region at multiple levels  M48.061     2. Chronic back pain greater than 3 months duration  M54.9    G89.29     3. Chronic pain syndrome  G89.4      Total encounter time: 40 minutes. This includes total time spent on the day of encounter.  Very involved chart review and a extremely complicated spine case.  The patient has been treated for years by multiple spinal subspecialist including neurosurgery, orthopedic surgery, and pain management.  I tried to be very frank with the patient, and I do not think that there would be anything that would give him permanent pain relief.  Any reduction of pain and functionality would be a solid win.  He does have severe spinal stenosis and severe foraminal stenosis bilaterally, multilevel based on prior CT myelogram in 2019.  Patient cannot have an MRI given spinal cord stimulator.  He declined to have the battery removed last year by neurosurgery.  This is a very complicated case, and I think that he needs to be managed by higher level of care than any of [name] in our office can provide.  I am going to consult neurosurgery for their expertise, given his neurosurgeon has left the region.  I think that he could benefit from long-term pain management/opioid management as well.  He had no improvement with Norco, so I am going to give him some Percocet 7.5.  This does need to be managed by either his primary care doctor or pain management long-term.  Consideration of long-term opioid such as extended release morphine, OxyContin, or other could be considered in  this recalcitrant pain case.  I will discuss this with the patient's primary care doctor.  This is not a sports medicine case, and I do not think I have much to add.  I will let the spine subspecialist manage this case.  No additional follow-up with me is needed.  Medications Discontinued During This Encounter  Medication Reason   HYDROcodone-acetaminophen ([place]) 10-325 MG tablet    No orders of the defined types were placed in this encounter.   Follow-up: None  Dr[employer] One speech-to-text software was used for transcription in this dictation.  Possible transcriptional errors can occur using Editor, commissioning.   Signed,  Maud Deed. Avalyn Molino, MD   Outpatient Encounter Medications as of 10/12/2021  Medication Sig   acetaminophen (TYLENOL) 500 MG tablet Take 1 tablet (500 mg total) by mouth 2 (two) times daily as needed for moderate pain.   amLODipine (NORVASC) 5 MG tablet TAKE 1 TABLET(5 MG) BY  MOUTH DAILY   aspirin EC 81 MG tablet Take 1 tablet (81 mg total) by mouth daily.   Cholecalciferol (VITAMIN D3) 1000 units CAPS Take 1 capsule (1,000 Units total) by mouth daily.   ciclopirox (PENLAC) 8 % solution Apply topically at bedtime. Apply over nail and surrounding skin. Apply daily over previous coat. After seven (7) days, may remove with alcohol and continue cycle. (Patient taking differently: Apply 1 application topically at bedtime as needed. Apply over nail and surrounding skin. Apply daily over previous coat. After seven (7) days, may remove with alcohol and continue cycle.)   clopidogrel (PLAVIX) 75 MG tablet TAKE 1 TABLET(75 MG) BY MOUTH DAILY AT 6 AM   diphenhydramine-acetaminophen (TYLENOL PM) 25-500 MG TABS tablet Take 2 tablets by mouth at bedtime.   docusate sodium (COLACE) 100 MG capsule Take 100 mg by mouth daily.   Eszopiclone 3 MG TABS Take 1 tablet (3 mg total) by mouth at bedtime as needed. Use sparingly, take immediately before bedtime   ezetimibe (ZETIA) 10 MG  tablet TAKE 1 TABLET(10 MG) BY MOUTH DAILY   finasteride (PROSCAR) 5 MG tablet TAKE 1 TABLET(5 MG) BY MOUTH DAILY   fluocinonide (LIDEX) 0.05 % external solution Apply 1 application topically 2 (two) times daily as needed. (Patient taking differently: Apply 1 application topically daily as needed (irritation).)   furosemide (LASIX) 40 MG tablet TAKE 1 TABLET BY MOUTH EVERY DAY. TAKE AN EXTRA TABLET AS NEEDED FOR WEIGHT GAIN OF 3 LBS/DAY OR 5LBS/WEEK   gabapentin (NEURONTIN) 300 MG capsule TAKE 2 CAPSULE BY MOUTH AT BEDTIME WITH EXTRA CAPSULE DURING THE DAY AS NEEDED   guaiFENesin-codeine (CHERATUSSIN AC) 100-10 MG/5ML syrup Take 5 mLs by mouth 2 (two) times daily as needed for cough (do not mix with hydrocodone).   icosapent Ethyl (VASCEPA) 1 g capsule Take 2 capsules (2 g total) by mouth 2 (two) times daily.   ketoconazole (NIZORAL) 2 % cream APPLY TO FEET AND FACE AT BEDTIME AS DIRECTED   ketoconazole (NIZORAL) 2 % shampoo APPLY TOPICALLY 3 TIMES WEEKLY AS DIRECTED   levothyroxine (SYNTHROID) 150 MCG tablet Take 1 tablet (150 mcg total) by mouth daily before breakfast.   LINZESS 290 MCG CAPS capsule TAKE 1 CAPSULE(290 MCG) BY MOUTH DAILY 30 MINUTES BEFORE FIRST MEAL OF THE DAY ON AN EMPTY STOMACH   lisinopril (ZESTRIL) 10 MG tablet TAKE 1 TABLET(10 MG) BY MOUTH DAILY   Melatonin 10 MG CAPS Take 10 mg by mouth at bedtime.   meloxicam (MOBIC) 15 MG tablet Take 1 tablet (15 mg total) by mouth daily.   mometasone (ELOCON) 0.1 % lotion Apply to scalp QHS on Monday, Wednesday, and Friday. (Patient taking differently: Apply 1 application topically every Monday, Wednesday, and Friday. Apply to scalp QHS on Monday, Wednesday, and Friday.)   Multiple Vitamins-Minerals (CENTRUM SILVER PO) Take 1 tablet by mouth daily.   Multiple Vitamins-Minerals (PRESERVISION AREDS 2 PO) Take 1 tablet by mouth daily.   nitroGLYCERIN (NITROSTAT) 0.4 MG SL tablet Place 1 tablet (0.4 mg total) under the tongue every 5 (five)  minutes as needed for chest pain.   oxyCODONE-acetaminophen (PERCOCET) 7.5-325 MG tablet Take 1 tablet by mouth every 6 (six) hours as needed for severe pain.   PRALUENT 75 MG/ML SOAJ INJECT 1 PEN INTO THE SKIN EVERY 14 DAYS   predniSONE (DELTASONE) 20 MG tablet 2 tabs po daily for 5 days, then 1 tab po daily for 5 days   senna (SENOKOT) 8.6 MG tablet Take  1 tablet (8.6 mg total) by mouth daily.   tamsulosin (FLOMAX) 0.4 MG CAPS capsule Take 1 capsule (0.4 mg total) by mouth daily.   tiZANidine (ZANAFLEX) 2 MG tablet TAKE 1 TO 2 TABLETS(2 TO 4 MG) BY MOUTH TWICE DAILY AS NEEDED FOR MUSCLE SPASMS   traZODone (DESYREL) 50 MG tablet Take 1 tablet (50 mg total) by mouth at bedtime as needed for sleep.   [DISCONTINUED] HYDROcodone-acetaminophen (NORCO) 10-325 MG tablet Take 1 tablet by mouth every 8 (eight) hours as needed.   No facility-administered encounter medications on file as of 10/12/2021.

## 2021-10-13 ENCOUNTER — Other Ambulatory Visit: Payer: Self-pay

## 2021-10-13 ENCOUNTER — Encounter: Payer: Self-pay | Admitting: Family Medicine

## 2021-10-13 MED ORDER — AMLODIPINE BESYLATE 5 MG PO TABS: ORAL_TABLET | ORAL | 1 refills | Status: DC

## 2021-10-13 NOTE — Telephone Encounter (Signed)
Refill request from [facility] or Gapabentin 300 mg-last filled on 06/17/21 #200 with 1 refill Furosemide 40 mg-last filled on 07/05/20 # 135 with 3 refills  LOV 10/12/21 with Dr Lorelei Pont for back issues Next appointment on 12/28/21

## 2021-10-18 ENCOUNTER — Other Ambulatory Visit: Payer: Self-pay

## 2021-10-19 MED ORDER — GABAPENTIN 300 MG PO CAPS: ORAL_CAPSULE | ORAL | 1 refills | Status: DC

## 2021-10-19 MED ORDER — FUROSEMIDE 40 MG PO TABS: ORAL_TABLET | ORAL | 3 refills | Status: DC

## 2021-10-20 ENCOUNTER — Other Ambulatory Visit: Payer: Self-pay | Admitting: Family Medicine

## 2021-10-30 ENCOUNTER — Other Ambulatory Visit: Payer: Self-pay

## 2021-10-30 ENCOUNTER — Emergency Department: Payer: Medicare Other

## 2021-10-30 ENCOUNTER — Observation Stay
Admission: EM | Admit: 2021-10-30 | Discharge: 2021-10-31 | Disposition: A | Discharge status: Home / Self Care | Payer: Medicare Other | Attending: Internal Medicine | Admitting: Internal Medicine

## 2021-10-30 DIAGNOSIS — R110 Nausea: Secondary | ICD-10-CM | POA: Diagnosis not present

## 2021-10-30 DIAGNOSIS — G4733 Obstructive sleep apnea (adult) (pediatric): Secondary | ICD-10-CM | POA: Diagnosis not present

## 2021-10-30 DIAGNOSIS — Z8679 Personal history of other diseases of the circulatory system: Secondary | ICD-10-CM | POA: Diagnosis not present

## 2021-10-30 DIAGNOSIS — I5030 Unspecified diastolic (congestive) heart failure: Secondary | ICD-10-CM | POA: Diagnosis not present

## 2021-10-30 DIAGNOSIS — N183 Chronic kidney disease, stage 3 unspecified: Secondary | ICD-10-CM | POA: Diagnosis not present

## 2021-10-30 DIAGNOSIS — I4892 Unspecified atrial flutter: Secondary | ICD-10-CM | POA: Diagnosis not present

## 2021-10-30 DIAGNOSIS — R1110 Vomiting, unspecified: Secondary | ICD-10-CM | POA: Diagnosis not present

## 2021-10-30 DIAGNOSIS — I130 Hypertensive heart and chronic kidney disease with heart failure and stage 1 through stage 4 chronic kidney disease, or unspecified chronic kidney disease: Secondary | ICD-10-CM | POA: Insufficient documentation

## 2021-10-30 DIAGNOSIS — Z9989 Dependence on other enabling machines and devices: Secondary | ICD-10-CM | POA: Diagnosis not present

## 2021-10-30 DIAGNOSIS — R55 Syncope and collapse: Secondary | ICD-10-CM | POA: Diagnosis not present

## 2021-10-30 DIAGNOSIS — K8020 Calculus of gallbladder without cholecystitis without obstruction: Secondary | ICD-10-CM | POA: Diagnosis not present

## 2021-10-30 DIAGNOSIS — I509 Heart failure, unspecified: Secondary | ICD-10-CM | POA: Diagnosis not present

## 2021-10-30 DIAGNOSIS — I10 Essential (primary) hypertension: Secondary | ICD-10-CM | POA: Diagnosis not present

## 2021-10-30 DIAGNOSIS — Z20822 Contact with and (suspected) exposure to covid-19: Secondary | ICD-10-CM | POA: Insufficient documentation

## 2021-10-30 DIAGNOSIS — R112 Nausea with vomiting, unspecified: Secondary | ICD-10-CM | POA: Diagnosis not present

## 2021-10-30 DIAGNOSIS — I2510 Atherosclerotic heart disease of native coronary artery without angina pectoris: Secondary | ICD-10-CM | POA: Diagnosis present

## 2021-10-30 DIAGNOSIS — Z7982 Long term (current) use of aspirin: Secondary | ICD-10-CM | POA: Insufficient documentation

## 2021-10-30 DIAGNOSIS — E039 Hypothyroidism, unspecified: Secondary | ICD-10-CM | POA: Diagnosis not present

## 2021-10-30 DIAGNOSIS — Z7901 Long term (current) use of anticoagulants: Secondary | ICD-10-CM | POA: Insufficient documentation

## 2021-10-30 DIAGNOSIS — Z85828 Personal history of other malignant neoplasm of skin: Secondary | ICD-10-CM | POA: Insufficient documentation

## 2021-10-30 DIAGNOSIS — R1111 Vomiting without nausea: Secondary | ICD-10-CM | POA: Diagnosis not present

## 2021-10-30 DIAGNOSIS — Z79899 Other long term (current) drug therapy: Secondary | ICD-10-CM | POA: Diagnosis not present

## 2021-10-30 DIAGNOSIS — Z955 Presence of coronary angioplasty implant and graft: Secondary | ICD-10-CM | POA: Insufficient documentation

## 2021-10-30 DIAGNOSIS — R42 Dizziness and giddiness: Secondary | ICD-10-CM | POA: Diagnosis not present

## 2021-10-30 DIAGNOSIS — Z96653 Presence of artificial knee joint, bilateral: Secondary | ICD-10-CM | POA: Insufficient documentation

## 2021-10-30 LAB — COMPREHENSIVE METABOLIC PANEL
ALT: 20 U/L (ref 0–44)
AST: 21 U/L (ref 15–41)
Albumin: 4 g/dL (ref 3.5–5.0)
Alkaline Phosphatase: 56 U/L (ref 38–126)
Anion gap: 9 (ref 5–15)
BUN: 18 mg/dL (ref 8–23)
CO2: 25 mmol/L (ref 22–32)
Calcium: 10.3 mg/dL (ref 8.9–10.3)
Chloride: 102 mmol/L (ref 98–111)
Creatinine, Ser: 1.17 mg/dL (ref 0.61–1.24)
GFR, Estimated: >60 mL/min (ref >60)
Glucose, Bld: 198 mg/dL — ABNORMAL HIGH (ref 70–99)
Potassium: 3.5 mmol/L (ref 3.5–5.1)
Sodium: 136 mmol/L (ref 135–145)
Total Bilirubin: 0.5 mg/dL (ref 0.3–1.2)
Total Protein: 7 g/dL (ref 6.5–8.1)

## 2021-10-30 LAB — RESP PANEL BY RT-PCR (FLU A&B, COVID) ARPGX2
Influenza A by PCR: NEGATIVE
Influenza B by PCR: NEGATIVE
SARS Coronavirus 2 by RT PCR: NEGATIVE

## 2021-10-30 LAB — TROPONIN I (HIGH SENSITIVITY)
Troponin I (High Sensitivity): 13 ng/L (ref <18)
Troponin I (High Sensitivity): 17 ng/L (ref <18)

## 2021-10-30 LAB — LACTIC ACID, PLASMA: Lactic Acid, Venous: 1.5 mmol/L (ref 0.5–1.9)

## 2021-10-30 LAB — CBC
HCT: 37.2 % — ABNORMAL LOW (ref 39.0–52.0)
Hemoglobin: 12.1 g/dL — ABNORMAL LOW (ref 13.0–17.0)
MCH: 31.1 pg (ref 26.0–34.0)
MCHC: 32.5 g/dL (ref 30.0–36.0)
MCV: 95.6 fL (ref 80.0–100.0)
Platelets: 255 10*3/uL (ref 150–400)
RBC: 3.89 MIL/uL — ABNORMAL LOW (ref 4.22–5.81)
RDW: 15.2 % (ref 11.5–15.5)
WBC: 9.8 10*3/uL (ref 4.0–10.5)
nRBC: 0 % (ref 0.0–0.2)

## 2021-10-30 LAB — LIPASE, BLOOD: Lipase: 42 U/L (ref 11–51)

## 2021-10-30 MED ORDER — MELATONIN 5 MG PO TABS: 10.0000 mg | ORAL_TABLET | Freq: Every evening | ORAL | Status: DC | PRN

## 2021-10-30 MED ORDER — DIAZEPAM 5 MG/ML IJ SOLN: 2.5000 mg | INTRAMUSCULAR | Status: DC | PRN

## 2021-10-30 MED ORDER — EZETIMIBE 10 MG PO TABS
10.0000 mg | ORAL_TABLET | Freq: Every day | ORAL | Status: DC
  Administered 2021-10-31: 10 mg via ORAL
  Filled 2021-10-30: qty 1

## 2021-10-30 MED ORDER — ONDANSETRON HCL 4 MG/2ML IJ SOLN
4.0000 mg | Freq: Once | INTRAMUSCULAR | Status: AC | PRN
  Administered 2021-10-30: 4 mg via INTRAVENOUS
  Filled 2021-10-30: qty 2

## 2021-10-30 MED ORDER — AMLODIPINE BESYLATE 5 MG PO TABS
5.0000 mg | ORAL_TABLET | Freq: Every day | ORAL | Status: DC
  Administered 2021-10-31: 5 mg via ORAL
  Filled 2021-10-30: qty 1

## 2021-10-30 MED ORDER — TRAZODONE HCL 50 MG PO TABS: 50.0000 mg | ORAL_TABLET | Freq: Every evening | ORAL | Status: DC | PRN

## 2021-10-30 MED ORDER — SODIUM CHLORIDE 0.9 % IV BOLUS
500.0000 mL | Freq: Once | INTRAVENOUS | Status: AC
  Administered 2021-10-30: 500 mL via INTRAVENOUS

## 2021-10-30 MED ORDER — FINASTERIDE 5 MG PO TABS
5.0000 mg | ORAL_TABLET | Freq: Every day | ORAL | Status: DC
  Administered 2021-10-31: 5 mg via ORAL
  Filled 2021-10-30: qty 1

## 2021-10-30 MED ORDER — OMEGA-3-ACID ETHYL ESTERS 1 G PO CAPS
1.0000 g | ORAL_CAPSULE | Freq: Every day | ORAL | Status: DC
  Administered 2021-10-31: 1 g via ORAL
  Filled 2021-10-30: qty 1

## 2021-10-30 MED ORDER — MELOXICAM 7.5 MG PO TABS
15.0000 mg | ORAL_TABLET | Freq: Every day | ORAL | Status: DC
  Administered 2021-10-31: 15 mg via ORAL
  Filled 2021-10-30 (×3): qty 2

## 2021-10-30 MED ORDER — LISINOPRIL 10 MG PO TABS
10.0000 mg | ORAL_TABLET | Freq: Every day | ORAL | Status: DC
  Administered 2021-10-31: 10 mg via ORAL
  Filled 2021-10-30: qty 1

## 2021-10-30 MED ORDER — TAMSULOSIN HCL 0.4 MG PO CAPS
0.4000 mg | ORAL_CAPSULE | Freq: Every day | ORAL | Status: DC
  Administered 2021-10-31: 0.4 mg via ORAL
  Filled 2021-10-30: qty 1

## 2021-10-30 MED ORDER — ICOSAPENT ETHYL 1 G PO CAPS: 2.0000 g | ORAL_CAPSULE | Freq: Two times a day (BID) | ORAL | Status: DC

## 2021-10-30 MED ORDER — ONDANSETRON HCL 4 MG/2ML IJ SOLN
4.0000 mg | Freq: Four times a day (QID) | INTRAMUSCULAR | Status: DC | PRN
  Administered 2021-10-30: 4 mg via INTRAVENOUS
  Filled 2021-10-30: qty 2

## 2021-10-30 MED ORDER — LEVOTHYROXINE SODIUM 50 MCG PO TABS
150.0000 ug | ORAL_TABLET | Freq: Every day | ORAL | Status: DC
  Administered 2021-10-31: 150 ug via ORAL
  Filled 2021-10-30: qty 3

## 2021-10-30 MED ORDER — ONDANSETRON HCL 4 MG/2ML IJ SOLN
4.0000 mg | Freq: Once | INTRAMUSCULAR | Status: AC
  Administered 2021-10-30: 4 mg via INTRAVENOUS
  Filled 2021-10-30: qty 2

## 2021-10-30 MED ORDER — ONDANSETRON HCL 4 MG PO TABS: 4.0000 mg | ORAL_TABLET | Freq: Four times a day (QID) | ORAL | Status: DC | PRN

## 2021-10-30 MED ORDER — OXYCODONE-ACETAMINOPHEN 7.5-325 MG PO TABS: 1.0000 | ORAL_TABLET | Freq: Four times a day (QID) | ORAL | Status: DC | PRN

## 2021-10-30 MED ORDER — ENOXAPARIN SODIUM 60 MG/0.6ML IJ SOSY
60.0000 mg | PREFILLED_SYRINGE | INTRAMUSCULAR | Status: DC
  Administered 2021-10-30: 60 mg via SUBCUTANEOUS
  Filled 2021-10-30: qty 0.6

## 2021-10-30 MED ORDER — PROCHLORPERAZINE EDISYLATE 10 MG/2ML IJ SOLN
10.0000 mg | Freq: Four times a day (QID) | INTRAMUSCULAR | Status: DC | PRN
  Filled 2021-10-30: qty 2

## 2021-10-30 MED ORDER — FUROSEMIDE 40 MG PO TABS
40.0000 mg | ORAL_TABLET | Freq: Every day | ORAL | Status: DC
  Administered 2021-10-31: 40 mg via ORAL
  Filled 2021-10-30: qty 1

## 2021-10-30 MED ORDER — MECLIZINE HCL 25 MG PO TABS
12.5000 mg | ORAL_TABLET | Freq: Once | ORAL | Status: AC
  Administered 2021-10-30: 12.5 mg via ORAL
  Filled 2021-10-30: qty 1

## 2021-10-30 MED ORDER — DOCUSATE SODIUM 100 MG PO CAPS: 100.0000 mg | ORAL_CAPSULE | Freq: Every day | ORAL | Status: DC | PRN

## 2021-10-30 MED ORDER — IOHEXOL 350 MG/ML SOLN
100.0000 mL | Freq: Once | INTRAVENOUS | Status: AC | PRN
  Administered 2021-10-30: 100 mL via INTRAVENOUS

## 2021-10-30 NOTE — Assessment & Plan Note (Signed)
Renal fxn ok ?Will repeat in AM for renal fxn and lytes given vomiting  ?

## 2021-10-30 NOTE — ED Triage Notes (Signed)
Pt to ED from [employer], pt was vomiting in bathroom ?Pt became nauseous at table, went to bathroom started vomiting and was feeling dizzy ?Hx a flutter, has cardiac stents ?Takes plavix and ASA, lisinopril and amlodipine ? ?Pt 90% on RA ? ?CBG 226 ?18g R forearm ? ?Pt actively vomiting at this time ?EMS EKG shows a flutter controlled rate with frequent PVCs ? ?Pt has chronic pain L leg ?

## 2021-10-30 NOTE — Assessment & Plan Note (Signed)
CPAP ordered qhs ?

## 2021-10-30 NOTE — Assessment & Plan Note (Signed)
S/p PCI 2013, on DAPT outpatient, following w/ cardiology ?Telemetry at least overnight  ?Continue home meds  ?

## 2021-10-30 NOTE — ED Provider Notes (Signed)
? ?[facility] ?Provider Note ? ? ? Event Date/Time  ? First MD Initiated Contact with Patient 10/30/21 1108   ?  (approximate) ? ? ?History  ? ?Emesis ? ? ?HPI ? ?Anthony [name] is a 86 y.o. male with a history of atrial flutter, CAD status post stenting, hypertension, chronic kidney disease, OSA, and chronic pain who presents with acute onset of nausea and vomiting.  The patient was in [place] [place] and states that he felt nauseated and did not feel like eating anything.  He he then became more nauseous, went to the bathroom, started vomiting, and then started to feel dizzy.  He states that this felt like lightheadedness as if he was going to pass out, but states he did not pass out.  He denies associated chest pain or difficulty breathing.  He denies diarrhea or abdominal pain.  He has no fever.  The patient states that last night and this morning when he got up he felt well.  He denies any sensation of the room spinning or vertigo. ? ? ?Physical Exam  ? ?Triage Vital Signs: ?ED Triage Vitals  ?Enc Vitals Group  ?   BP 10/30/21 1106 134/84  ?   Pulse Rate 10/30/21 1106 78  ?   Resp 10/30/21 1106 17  ?   Temp 10/30/21 1106 97.7 ?F (36.5 ?C)  ?   Temp Source 10/30/21 1106 Oral  ?   SpO2 10/30/21 1106 95 %  ?   Weight 10/30/21 1108 270 lb (122.5 kg)  ?   Height 10/30/21 1108 6' (1.829 m)  ?   Head Circumference --   ?   Peak Flow --   ?   Pain Score 10/30/21 1108 1  ?   Pain Loc --   ?   Pain Edu? --   ?   Excl. in [place]? --   ? ? ?Most recent vital signs: ?Vitals:  ? 10/30/21 1345 10/30/21 1400  ?BP:  (!) 156/86  ?Pulse: 78 69  ?Resp:    ?Temp:    ?SpO2: 100% 98%  ? ? ? ?General: Alert, somewhat uncomfortable appearing but in no acute distress. ?CV:  Good peripheral perfusion.  Normal heart sounds. ?Resp:  Normal effort.  Lungs CTAB. ?Abd:  Soft and nontender.  No distention.  ?Other:  EOMI.  PERRLA.  Motor intact in all extremities.  Normal coordination. ? ? ?ED Results / Procedures /  Treatments  ? ?Labs ?(all labs ordered are listed, but only abnormal results are displayed) ?Labs Reviewed  ?COMPREHENSIVE METABOLIC PANEL - Abnormal; Notable for the following components:  ?    Result Value  ? Glucose, Bld 198 (*)   ? All other components within normal limits  ?CBC - Abnormal; Notable for the following components:  ? RBC 3.89 (*)   ? Hemoglobin 12.1 (*)   ? HCT 37.2 (*)   ? All other components within normal limits  ?RESP PANEL BY RT-PCR (FLU A&B, COVID) ARPGX2  ?LIPASE, BLOOD  ?LACTIC ACID, PLASMA  ?URINALYSIS, ROUTINE W REFLEX MICROSCOPIC  ?TROPONIN I (HIGH SENSITIVITY)  ?TROPONIN I (HIGH SENSITIVITY)  ? ? ? ?EKG ? ?ED ECG REPORT ?IArta Silence, the attending physician, personally viewed and interpreted this ECG. ? ?Date: 10/30/2021 ?EKG Time: 1122 ?Rate: 84 ?Rhythm: normal sinus rhythm with PACs and PVCs ?QRS Axis: normal ?Intervals: normal ?ST/T Wave abnormalities: normal ?Narrative Interpretation: no evidence of acute ischemia ? ? ? ?RADIOLOGY ? ?CT head: I independently viewed  and interpreted the images; there is no ICH or other acute abnormality ? ?Chest x-ray: I independently viewed and interpreted the images; there is no focal consolidation or edema ? ?PROCEDURES: ? ?Critical Care performed: No ? ?Procedures ? ? ?MEDICATIONS ORDERED IN ED: ?Medications  ?iohexol (OMNIPAQUE) 350 MG/ML injection 100 mL (has no administration in time range)  ?ondansetron (ZOFRAN) injection 4 mg (4 mg Intravenous Given 10/30/21 1134)  ?sodium chloride 0.9 % bolus 500 mL (0 mLs Intravenous Stopped 10/30/21 1255)  ?meclizine (ANTIVERT) tablet 12.5 mg (12.5 mg Oral Given 10/30/21 1411)  ?ondansetron [facility]) injection 4 mg (4 mg Intravenous Given 10/30/21 1506)  ? ? ? ?IMPRESSION / MDM / ASSESSMENT AND PLAN / ED COURSE  ?I reviewed the triage vital signs and the nursing notes. ? ?86 year old male with PMH as noted above presents with nausea and vomiting with no associated abdominal pain, followed by dizziness and  near syncope.  The patient states he feels somewhat better now. ? ?On exam the vital signs are normal.  Neurologic exam is nonfocal.  The abdomen is soft and nontender. ? ?Differential diagnosis includes, but is not limited to, vasovagal near syncope, gastritis, gastroenteritis, viral syndrome, peripheral vertigo, or less likely ACS or other cardiac cause, or CNS etiology. ? ?We will obtain CT head, chest x-ray, lab work-up, give fluids and antiemetic, reassess. ? ?The patient is on the cardiac monitor to evaluate for evidence of arrhythmia and/or significant heart rate changes. ? ?----------------------------------------- ?3:10 PM on 10/30/2021 ?----------------------------------------- ? ?CT head was negative for acute findings.  BMP and CBC are unremarkable.  Respiratory panel is negative.  Troponins are negative x2. ? ?On reassessment, the patient still felt somewhat dizzy when moving so I gave a dose of meclizine.  He stated he felt better.  We tried to sit him up on the edge of the bed, but he vomited soon after.  The patient wanted to be helped to the commode, but is not able to walk unassisted, still feels unsteady on his feet, and had additional vomiting. ? ?The patient has no abdominal pain and I have a low suspicion for acute intra-abdominal process, but given his age and the persistent vomiting I ordered a CT abdomen.  Given his persistent dizziness and unsteady gait I suspect peripheral vertigo as the likely etiology of his symptoms.  However, he will not be able to safely go home unless his symptoms improve further.  I consulted Dr. Sheppard Coil from the hospitalist service; based on our discussion she agrees to admit the patient. ? ? ?FINAL CLINICAL IMPRESSION(S) / ED DIAGNOSES  ? ?Final diagnoses:  ?Near syncope  ?Nausea and vomiting, unspecified vomiting type  ? ? ? ?Rx / DC Orders  ? ?ED Discharge Orders   ? ? None  ? ?  ? ? ? ?Note:  This document was prepared using Dragon voice recognition software  and may include unintentional dictation errors.  ?  Arta Silence, MD ?10/30/21 1511 ? ?

## 2021-10-30 NOTE — ED Notes (Signed)
Pt was 88% on RA placed on 2L now 93%. ?

## 2021-10-30 NOTE — Assessment & Plan Note (Addendum)
Ddx includes gastritis or vertigo, less likely neurologic cause no tinnitus/hearing loss, presyncopal episode c/w vasovagal episode ?CT head, CXR, ECG, labs all ok in ED ?CT abdomen pending  ?Symptom management: zofran, valium ?Unable to ambulate/move without N/V at this time, ED physician noted nystagmus but otherwise normal neurological exam, if improve but not resolve would consider PT eval for vestibular rehab treatment ?

## 2021-10-30 NOTE — H&P (Addendum)
HISTORY AND PHYSICAL  [name] 86 y.o. male MRN: [id number]  Today '@TODAY'$ @ is hospital day 0 after admission on 10/30/2021 11:02 AM  RECORD [place] COURSE: Anthony [name] is a 86 y.o. male with a history of atrial flutter, CAD status post stenting 2013, hypertension, chronic kidney disease, OSA, and chronic pain who presents with acute onset of nausea and vomiting.  The patient was in [place] [place] and states that he felt nauseated and did not feel like eating anything.  He he then became more nauseous, went to the bathroom, started vomiting, and then started to feel dizzy.  He states that this felt like lightheadedness as if he was going to pass out, but states he did not pass out.  He denies associated chest pain or difficulty breathing.  He denies diarrhea or abdominal pain.  He has no fever.  The patient states that last night and this morning when he got up he felt well.  He denies any sensation of the room spinning or vertigo. In ED 10/30/21, received zofran and meclizine, 500 ml NS bolus. No significant concerns on CBC, CMP, troponins, lactate, lipase. EKG no concerns. CXR and CT head no concerns. Pending UA and CT Abdomen.   Procedures and Significant Results:  CT head 10/30/21 no acute abn, (+)small vessle ischemic change and brain atrophy, sinus inflammation chronic CXR 10/30/21 no acute process  CT Abdomen 10/30/21 no acute findings to explain symptoms, Since the prior CTA dated 04/09/2020 the abdominal aortic aneurysm has been excluded with a stent graft, which is well positioned. No evidence of leakageChronic findings include gallstones and renal cysts as well as significant degenerative changes throughout the visualized spine.  Consultants:  none    SUBJECTIVE:  Pt seen and examined in ED, resting in bed, had just had episode of significant emesis when tried to get out of bed, nursing cleaning patient and floor. Patient confirms above history, reports  he's feeling a little hungry but nervous to eat, declined full neuro exam since it causes him nausea to move around much.       ASSESSMENT & PLAN  Intractable nausea and vomiting assoc w/ presyncopal episode  Ddx includes gastritis or vertigo, less likely neurologic cause no tinnitus/hearing loss, presyncopal episode c/w vasovagal episode CT head, CXR, ECG, labs all ok in ED CT abdomen pending  Symptom management: zofran, valium Unable to ambulate/move without N/V at this time, ED physician noted nystagmus but otherwise normal neurological exam, if improve but not resolve would consider PT eval for vestibular rehab treatment  History of atrial flutter No active Afib/Aflutter Pt s/p ablation, not on anticoagulation as outpatient, on DAPT (ASA + Plavix) at home, uncertain why based on last cardiology note  Continue telemetry and will hold DAPT for now w/ VTE Rx Ppx  Hypothyroidism Seems unlikely to be cause of current symptoms  Last TSH 05/2021 slightly outside normal but no changes to meds at that time Will check TSH but unless markedly abnormal would have him f/u outpatient  CAD (coronary artery disease) S/p PCI 2013, on DAPT outpatient, following w/ cardiology Telemetry at least overnight  Continue home meds   OSA on CPAP CPAP ordered qhs  CKD (chronic kidney disease) stage 3, GFR 30-59 ml/min (HCC) Renal fxn ok Will repeat in AM for renal fxn and lytes given vomiting    VTE Ppx: Lovenox CODE STATUS   Code Status: DNR - he reports he has an advanced directive, his son has  a copy, nothing on file in [place] Admitted from: home Expected Dispo: home Barriers to discharge: continued medical w/u and treatment, unable to ambulate at this time Family communication: pt declines family call, he has been in touch w/ his son who was with him in ED earlier               Past Medical History:  Diagnosis Date   Anosmia 1980s   Atrial flutter ([place]) 2018   had an ablation  with dr. Erick Alley   CAD (coronary artery disease)    Cancer ([place])    skin cancer   Chronic constipation    Chronic insomnia    Chronic lower back pain    s/p spine stimulator placement   Complication of anesthesia    when under general, he woke up agitated and unable to be held down   Diastolic CHF ([place])    History of diverticulitis 2017   History of pneumonia 2013   Hyperlipidemia    Hypertension    Hypothyroidism    Obesity, Class II, BMI 35-39.9, with comorbidity    OSA on CPAP    PONV (postoperative nausea and vomiting)     Past Surgical History:  Procedure Laterality Date   A-FLUTTER ABLATION N/A 05/07/2017   Procedure: A-Flutter Ablation;  Surgeon: Deboraha Sprang, MD;  Location: [facility];  Service: Cardiovascular;  Laterality: N/A;   CARDIOVASCULAR STRESS TEST  11/2017   no ischemia, low risk study   COLONOSCOPY  2007   normal per prior PCP records, rpt 10 yrs (Dr Osie Cheeks)   [place] Right    ulnar nerve decompression.  PT DOES NOT RECALL THIS PROCEDURE   ENDOVASCULAR REPAIR/STENT GRAFT N/A 05/05/2020   Procedure: ENDOVASCULAR REPAIR/STENT GRAFT;  Surgeon: Algernon Huxley, MD;  Location: [facility];  Service: Cardiovascular;  Laterality: N/A;   LAMINECTOMY THORACIC SPINE W/ PLACEMENT SPINAL CORD STIMULATOR  08/2018   and removal of prior spine stimulator (Dr Lacinda Axon)   NASAL SINUS SURGERY     nasal polyps. done a long time ago   [place] (PCI-S)  2013   EF55%, 70% mid LAD, 99% mid RCA, mild MR, elev LVEDP, DES to mid LAD. RCA is nondominant   RADIOFREQUENCY ABLATION  06/2017   lumbar region [facility])    REPLACEMENT TOTAL KNEE BILATERAL Bilateral 2000s   SPINAL CORD STIMULATOR IMPLANT  06/2016   SPINAL CORD STIMULATOR INSERTION N/A 09/09/2018   Procedure: LUMBAR SPINAL CORD STIMULATOR LEAD AND BATTERY REMOVAL AND LAMINECTOMY FOR PLACEMENT OF PADDLE;  Surgeon: Deetta Perla, MD;  Location: [facility];  Service: Neurosurgery;   Laterality: N/A;   SPINAL CORD STIMULATOR INSERTION N/A 09/16/2018   Procedure: PLACEMENT OF SPINAL CORD STIMULATOR BATTERY;  Surgeon: Deetta Perla, MD;  Location: [facility];  Service: Neurosurgery;  Laterality: N/A;    Family History  Problem Relation Age of Onset   Stroke Mother    Kidney disease Father    Leukemia Brother    Stroke Sister    Diabetes Neg Hx    Social History:  reports that he has never smoked. He has never used smokeless tobacco. He reports that he does not drink alcohol and does not use drugs.  Allergies:  Allergies  Allergen Reactions   Questran [Cholestyramine]     Patient not aware of an allergy to this medicine.   Atorvastatin     Muscle pain   Gemfibrozil Other (See Comments)    Muscle pain.  Metformin And Related     Dizziness   Rosuvastatin     Muscle pain    No current facility-administered medications on file prior to encounter.   Current Outpatient Medications on File Prior to Encounter  Medication Sig Dispense Refill   acetaminophen (TYLENOL) 500 MG tablet Take 1 tablet (500 mg total) by mouth 2 (two) times daily as needed for moderate pain.     amLODipine (NORVASC) 5 MG tablet TAKE 1 TABLET(5 MG) BY MOUTH DAILY 90 tablet 1   aspirin EC 81 MG tablet Take 1 tablet (81 mg total) by mouth daily.     Cholecalciferol (VITAMIN D3) 1000 units CAPS Take 1 capsule (1,000 Units total) by mouth daily. 30 capsule    ciclopirox (PENLAC) 8 % solution Apply topically at bedtime. Apply over nail and surrounding skin. Apply daily over previous coat. After seven (7) days, may remove with alcohol and continue cycle. (Patient taking differently: Apply 1 application topically at bedtime as needed. Apply over nail and surrounding skin. Apply daily over previous coat. After seven (7) days, may remove with alcohol and continue cycle.) 6.6 mL 0   clopidogrel (PLAVIX) 75 MG tablet TAKE 1 TABLET(75 MG) BY MOUTH DAILY AT 6 AM 90 tablet 3   diphenhydramine-acetaminophen  (TYLENOL PM) 25-500 MG TABS tablet Take 2 tablets by mouth at bedtime.     docusate sodium (COLACE) 100 MG capsule Take 100 mg by mouth daily.     Eszopiclone 3 MG TABS Take 1 tablet (3 mg total) by mouth at bedtime as needed. Use sparingly, take immediately before bedtime 30 tablet 0   ezetimibe (ZETIA) 10 MG tablet TAKE 1 TABLET(10 MG) BY MOUTH DAILY 90 tablet 3   finasteride (PROSCAR) 5 MG tablet TAKE 1 TABLET(5 MG) BY MOUTH DAILY 90 tablet 3   fluocinonide (LIDEX) 0.05 % external solution Apply 1 application topically 2 (two) times daily as needed. (Patient taking differently: Apply 1 application topically daily as needed (irritation).) 60 mL 2   furosemide (LASIX) 40 MG tablet TAKE 1 TABLET BY MOUTH EVERY DAY. TAKE AN EXTRA TABLET AS NEEDED FOR WEIGHT GAIN OF 3 LBS/DAY OR 5LBS/WEEK 135 tablet 3   gabapentin (NEURONTIN) 300 MG capsule TAKE 2 CAPSULE BY MOUTH AT BEDTIME WITH EXTRA CAPSULE DURING THE DAY AS NEEDED 200 capsule 1   guaiFENesin-codeine (CHERATUSSIN AC) 100-10 MG/5ML syrup Take 5 mLs by mouth 2 (two) times daily as needed for cough (do not mix with hydrocodone). 120 mL 0   icosapent Ethyl (VASCEPA) 1 g capsule Take 2 capsules (2 g total) by mouth 2 (two) times daily. 360 capsule 3   ketoconazole (NIZORAL) 2 % cream APPLY TO FEET AND FACE AT BEDTIME AS DIRECTED 60 g 3   ketoconazole (NIZORAL) 2 % shampoo APPLY TOPICALLY 3 TIMES WEEKLY AS DIRECTED 120 mL 2   levothyroxine (SYNTHROID) 150 MCG tablet Take 1 tablet (150 mcg total) by mouth daily before breakfast. 90 tablet 3   LINZESS 290 MCG CAPS capsule TAKE 1 CAPSULE(290 MCG) BY MOUTH DAILY 30 MINUTES BEFORE FIRST MEAL OF THE DAY ON AN EMPTY STOMACH 90 capsule 1   lisinopril (ZESTRIL) 10 MG tablet TAKE 1 TABLET(10 MG) BY MOUTH DAILY 90 tablet 3   Melatonin 10 MG CAPS Take 10 mg by mouth at bedtime.     meloxicam (MOBIC) 15 MG tablet Take 1 tablet (15 mg total) by mouth daily.     mometasone (ELOCON) 0.1 % lotion Apply to scalp QHS on  Monday,  Wednesday, and Friday. (Patient taking differently: Apply 1 application topically every Monday, Wednesday, and Friday. Apply to scalp QHS on Monday, Wednesday, and Friday.) 60 mL 3   Multiple Vitamins-Minerals (CENTRUM SILVER PO) Take 1 tablet by mouth daily.     Multiple Vitamins-Minerals (PRESERVISION AREDS 2 PO) Take 1 tablet by mouth daily.     nitroGLYCERIN (NITROSTAT) 0.4 MG SL tablet Place 1 tablet (0.4 mg total) under the tongue every 5 (five) minutes as needed for chest pain. 25 tablet 0   oxyCODONE-acetaminophen (PERCOCET) 7.5-325 MG tablet Take 1 tablet by mouth every 6 (six) hours as needed for severe pain. 30 tablet 0   PRALUENT 75 MG/ML SOAJ INJECT 1 PEN INTO THE SKIN EVERY 14 DAYS 2 mL 11   predniSONE (DELTASONE) 20 MG tablet 2 tabs po daily for 5 days, then 1 tab po daily for 5 days 15 tablet 0   senna (SENOKOT) 8.6 MG tablet Take 1 tablet (8.6 mg total) by mouth daily. 30 tablet 3   tamsulosin (FLOMAX) 0.4 MG CAPS capsule Take 1 capsule (0.4 mg total) by mouth daily. 90 capsule 3   tiZANidine (ZANAFLEX) 2 MG tablet TAKE 1 TO 2 TABLETS(2 TO 4 MG) BY MOUTH TWICE DAILY AS NEEDED FOR MUSCLE SPASMS 30 tablet 0   traZODone (DESYREL) 50 MG tablet Take 1 tablet (50 mg total) by mouth at bedtime as needed for sleep.       Results for orders placed or performed during the hospital encounter of 10/30/21 (from the past 48 hour(s))  Lipase, blood     Status: None   Collection Time: 10/30/21 11:10 AM  Result Value Ref Range   Lipase 42 11 - 51 U/L    Comment: Performed at [facility], [address] [place], [place] [address]  Comprehensive metabolic panel     Status: Abnormal   Collection Time: 10/30/21 11:10 AM  Result Value Ref Range   Sodium 136 135 - 145 mmol/L   Potassium 3.5 3.5 - 5.1 mmol/L   Chloride 102 98 - 111 mmol/L   CO2 25 22 - 32 mmol/L   Glucose, Bld 198 (H) 70 - 99 mg/dL    Comment: Glucose reference range applies only to samples taken after  fasting for at least 8 hours.   BUN 18 8 - 23 mg/dL   Creatinine, Ser 1.17 0.61 - 1.24 mg/dL   Calcium 10.3 8.9 - 10.3 mg/dL   Total Protein 7.0 6.5 - 8.1 g/dL   Albumin 4.0 3.5 - 5.0 g/dL   AST 21 15 - 41 U/L   ALT 20 0 - 44 U/L   Alkaline Phosphatase 56 38 - 126 U/L   Total Bilirubin 0.5 0.3 - 1.2 mg/dL   GFR, Estimated >60 >60 mL/min    Comment: (NOTE) Calculated using the CKD-EPI Creatinine Equation (2021)    Anion gap 9 5 - 15    Comment: Performed at [facility], [address] [place], [place] [address]  CBC     Status: Abnormal   Collection Time: 10/30/21 11:10 AM  Result Value Ref Range   WBC 9.8 4.0 - 10.5 K/uL   RBC 3.89 (L) 4.22 - 5.81 MIL/uL   Hemoglobin 12.1 (L) 13.0 - 17.0 g/dL   HCT 37.2 (L) 39.0 - 52.0 %   MCV 95.6 80.0 - 100.0 fL   MCH 31.1 26.0 - 34.0 pg   MCHC 32.5 30.0 - 36.0 g/dL   RDW 15.2 11.5 - 15.5 %  Platelets 255 150 - 400 K/uL   nRBC 0.0 0.0 - 0.2 %    Comment: Performed at [facility], [place], [place] [address]  Troponin I (High Sensitivity)     Status: None   Collection Time: 10/30/21 11:28 AM  Result Value Ref Range   Troponin I (High Sensitivity) 13 <18 ng/L    Comment: (NOTE) Elevated high sensitivity troponin I (hsTnI) values and significant  changes across serial measurements may suggest ACS but many other  chronic and acute conditions are known to elevate hsTnI results.  Refer to the "Links" section for chest pain algorithms and additional  guidance. Performed at [facility], [address] [place], [place] [address]   Resp Panel by RT-PCR (Flu A&B, Covid) Nasopharyngeal Swab     Status: None   Collection Time: 10/30/21 11:28 AM   Specimen: Nasopharyngeal Swab; Nasopharyngeal(NP) swabs in vial transport medium  Result Value Ref Range   SARS Coronavirus 2 by RT PCR NEGATIVE NEGATIVE    Comment: (NOTE) SARS-CoV-2 target nucleic acids are NOT DETECTED.  The SARS-CoV-2 RNA is  generally detectable in upper respiratory specimens during the acute phase of infection. The lowest concentration of SARS-CoV-2 viral copies this assay can detect is 138 copies/mL. A negative result does not preclude SARS-Cov-2 infection and should not be used as the sole basis for treatment or other patient management decisions. A negative result may occur with  improper specimen collection/handling, submission of specimen other than nasopharyngeal swab, presence of viral mutation(s) within the areas targeted by this assay, and inadequate number of viral copies(<138 copies/mL). A negative result must be combined with clinical observations, patient history, and epidemiological information. The expected result is Negative.  Fact Sheet for Patients:  EntrepreneurPulse.com.au  Fact Sheet for Healthcare Providers:  IncredibleEmployment.be  This test is no t yet approved or cleared by the [place] FDA and  has been authorized for detection and/or diagnosis of SARS-CoV-2 by FDA under an Emergency Use Authorization (EUA). This EUA will remain  in effect (meaning this test can be used) for the duration of the COVID-19 declaration under Section 564(b)(1) of the Act, 21 U.S.C.section 360bbb-3(b)(1), unless the authorization is terminated  or revoked sooner.       Influenza A by PCR NEGATIVE NEGATIVE   Influenza B by PCR NEGATIVE NEGATIVE    Comment: (NOTE) The Xpert Xpress SARS-CoV-2/FLU/RSV plus assay is intended as an aid in the diagnosis of influenza from Nasopharyngeal swab specimens and should not be used as a sole basis for treatment. Nasal washings and aspirates are unacceptable for Xpert Xpress SARS-CoV-2/FLU/RSV testing.  Fact Sheet for Patients: EntrepreneurPulse.com.au  Fact Sheet for Healthcare Providers: IncredibleEmployment.be  This test is not yet approved or cleared by the [place] FDA  and has been authorized for detection and/or diagnosis of SARS-CoV-2 by FDA under an Emergency Use Authorization (EUA). This EUA will remain in effect (meaning this test can be used) for the duration of the COVID-19 declaration under Section 564(b)(1) of the Act, 21 U.S.C. section 360bbb-3(b)(1), unless the authorization is terminated or revoked.  Performed at [facility], [address] [place], [place] [address]   Lactic acid, plasma     Status: None   Collection Time: 10/30/21 11:28 AM  Result Value Ref Range   Lactic Acid, Venous 1.5 0.5 - 1.9 mmol/L    Comment: Performed at [facility], [address] [place], [place] [address]  Troponin I (High Sensitivity)  Status: None   Collection Time: 10/30/21  1:28 PM  Result Value Ref Range   Troponin I (High Sensitivity) 17 <18 ng/L    Comment: (NOTE) Elevated high sensitivity troponin I (hsTnI) values and significant  changes across serial measurements may suggest ACS but many other  chronic and acute conditions are known to elevate hsTnI results.  Refer to the "Links" section for chest pain algorithms and additional  guidance. Performed at [facility], [address] [place], [place] [address]    DG Chest 2 View  Result Date: 10/30/2021 CLINICAL DATA:  Pt to ED from [employer], pt was vomiting in bathroomPt became nauseous at table, went to bathroom started vomiting and was feeling dizzyHx a flutter, has cardiac stents EXAM: CHEST - 2 VIEW COMPARISON:  03/08/2018. FINDINGS: Cardiac silhouette is mildly enlarged. No mediastinal or hilar masses. No evidence of adenopathy. There are prominent bronchovascular/interstitial markings bilaterally, accentuated by low lung volumes. No lung consolidation or convincing edema. No pleural effusion or pneumothorax. Skeletal structures are grossly intact. Spine stimulator leads project within the lower thoracic spinal canal. IMPRESSION: 1. No acute  cardiopulmonary disease. Electronically Signed   By: Lajean Manes M.D.   On: 10/30/2021 12:07   CT Head Wo Contrast  Result Date: 10/30/2021 CLINICAL DATA:  Dizziness.  Vomiting. EXAM: CT HEAD WITHOUT CONTRAST TECHNIQUE: Contiguous axial images were obtained from the base of the skull through the vertex without intravenous contrast. RADIATION DOSE REDUCTION: This exam was performed according to the departmental dose-optimization program which includes automated exposure control, adjustment of the mA and/or kV according to patient size and/or use of iterative reconstruction technique. COMPARISON:  None. FINDINGS: Brain: No evidence of acute infarction, hemorrhage, hydrocephalus, extra-axial collection or mass lesion/mass effect. Prominence of the sulci and ventricles compatible with brain atrophy. There is mild diffuse low-attenuation within the subcortical and periventricular white matter compatible with chronic microvascular disease. Vascular: No hyperdense vessel or unexpected calcification. Skull: Normal. Negative for fracture or focal lesion. Sinuses/Orbits: Postoperative changes from bilateral median antrectomy and bilateral ethmoidectomies. Partial opacification of the remaining ethmoid air cells. Mild mucosal thickening is noted involving both maxillary sinuses in the sphenoid sinus. Other: None. IMPRESSION: 1. No acute intracranial abnormalities. 2. Chronic small vessel ischemic disease and brain atrophy. 3. Chronic sinus inflammation. Electronically Signed   By: Kerby Moors M.D.   On: 10/30/2021 12:00    Review of Systems  Constitutional:  Negative for chills, fever and unexpected weight change.  HENT:  Negative for hearing loss, tinnitus and trouble swallowing.   Respiratory:  Negative for cough, chest tightness and shortness of breath.   Cardiovascular:  Negative for chest pain, palpitations and leg swelling.  Gastrointestinal:  Positive for nausea and vomiting. Negative for abdominal  distention, abdominal pain and diarrhea.  Musculoskeletal:  Negative for arthralgias.  Skin:  Negative for color change and rash.  Neurological:  Positive for dizziness. Negative for syncope and weakness.  Psychiatric/Behavioral:  Negative for confusion.    Blood pressure (!) 156/86, pulse 69, temperature 97.7 F (36.5 C), temperature source Oral, resp. rate (!) 7, height 6' (1.829 m), weight 122.5 kg, SpO2 98 %. Physical Exam Constitutional:      General: He is not in acute distress.    Appearance: Normal appearance. He is obese.  HENT:     Head: Normocephalic and atraumatic.  Eyes:     Extraocular Movements: Extraocular movements intact.  Cardiovascular:     Rate and Rhythm: Normal rate and regular  rhythm.  Pulmonary:     Effort: Pulmonary effort is normal.     Breath sounds: Normal breath sounds.  Abdominal:     General: There is no distension.     Tenderness: There is no abdominal tenderness. There is no guarding or rebound.     Comments: [place] somewhat limits exam  Musculoskeletal:     Cervical back: Normal range of motion and neck supple.     Right lower leg: No edema.     Left lower leg: No edema.  Skin:    General: Skin is warm and dry.  Neurological:     General: No focal deficit present.     Mental Status: He is alert and oriented to person, place, and time.     Cranial Nerves: No cranial nerve deficit.     Motor: No weakness.  Psychiatric:        Mood and Affect: Mood normal.        Behavior: Behavior normal.        Thought Content: Thought content normal.        Judgment: Judgment normal.       Emeterio Reeve, DO 10/30/2021, 3:39 PM

## 2021-10-30 NOTE — Progress Notes (Addendum)
PHARMACIST - PHYSICIAN COMMUNICATION ? ?CONCERNING:  Enoxaparin (Lovenox) for DVT Prophylaxis  ? ? ?RECOMMENDATION: ?Patient was prescribed enoxaprin '40mg'$  q24 hours for VTE prophylaxis.  ? Danley Danker Weights  ? 10/30/21 1108  ?Weight: 122.5 kg (270 lb)  ? ? ?Body mass index is 36.62 kg/m?. ? ?Estimated Creatinine Clearance: 63.6 mL/min (by C-G formula based on SCr of 1.17 mg/dL). ? ? ?Based on [place] patient is candidate for enoxaparin 0.'5mg'$ /kg TBW SQ every 24 hours based on BMI being >30. ? ? ?DESCRIPTION: ?Pharmacy has adjusted enoxaparin dose per [facility] policy. ? ?Patient is now receiving enoxaparin 60 mg every 24 hours  ? ? ?Wynelle Cleveland, PharmD ?Pharmacy Resident  ?10/30/2021 ?3:26 PM ? ? ?

## 2021-10-30 NOTE — Assessment & Plan Note (Addendum)
Seems unlikely to be cause of current symptoms  ?Last TSH 05/2021 slightly outside normal but no changes to meds at that time ?Will check TSH but unless markedly abnormal would have him f/u outpatient ?

## 2021-10-30 NOTE — Assessment & Plan Note (Signed)
No active Afib/Aflutter ?Pt s/p ablation, not on anticoagulation as outpatient, on DAPT (ASA + Plavix) at home, uncertain why based on last cardiology note  ?Continue telemetry and will hold DAPT for now w/ VTE Rx Ppx ?

## 2021-10-31 DIAGNOSIS — R112 Nausea with vomiting, unspecified: Secondary | ICD-10-CM | POA: Diagnosis not present

## 2021-10-31 DIAGNOSIS — R55 Syncope and collapse: Secondary | ICD-10-CM | POA: Diagnosis not present

## 2021-10-31 LAB — BASIC METABOLIC PANEL
Anion gap: 8 (ref 5–15)
BUN: 18 mg/dL (ref 8–23)
CO2: 26 mmol/L (ref 22–32)
Calcium: 9.8 mg/dL (ref 8.9–10.3)
Chloride: 103 mmol/L (ref 98–111)
Creatinine, Ser: 1.12 mg/dL (ref 0.61–1.24)
GFR, Estimated: >60 mL/min (ref >60)
Glucose, Bld: 100 mg/dL — ABNORMAL HIGH (ref 70–99)
Potassium: 4.1 mmol/L (ref 3.5–5.1)
Sodium: 137 mmol/L (ref 135–145)

## 2021-10-31 LAB — CBC
HCT: 33.4 % — ABNORMAL LOW (ref 39.0–52.0)
Hemoglobin: 11 g/dL — ABNORMAL LOW (ref 13.0–17.0)
MCH: 31.3 pg (ref 26.0–34.0)
MCHC: 32.9 g/dL (ref 30.0–36.0)
MCV: 94.9 fL (ref 80.0–100.0)
Platelets: 237 10*3/uL (ref 150–400)
RBC: 3.52 MIL/uL — ABNORMAL LOW (ref 4.22–5.81)
RDW: 15.1 % (ref 11.5–15.5)
WBC: 10.2 10*3/uL (ref 4.0–10.5)
nRBC: 0 % (ref 0.0–0.2)

## 2021-10-31 MED ORDER — ONDANSETRON HCL 4 MG PO TABS: 4.0000 mg | ORAL_TABLET | Freq: Four times a day (QID) | ORAL | 0 refills | Status: DC | PRN

## 2021-10-31 NOTE — Progress Notes (Signed)
Notified patient was placed in [place] lounge. Notified volunteer services. ?

## 2021-10-31 NOTE — Discharge Summary (Signed)
Primary care provider within a week Physician Discharge Summary   [name] MRN: [id number] DOB: 04/24/1936  Admit date:     10/30/2021  Discharge date: 10/31/2021  Discharge Physician: Lorella Nimrod   PCP: Ria Bush, MD   Recommendations at discharge:  1.  Follow-up with primary care provider within a week.  Discharge Diagnoses: Principal Problem:   Intractable nausea and vomiting assoc w/ presyncopal episode  Active Problems:   OSA on CPAP   History of atrial flutter   Diastolic heart failure (HCC)   Hypothyroidism   CAD (coronary artery disease)   CKD (chronic kidney disease) stage 3, GFR 30-59 ml/min [facility])   Hospital Course: Anthony [name] is a 86 y.o. male with a history of atrial flutter, CAD status post stenting 2013, hypertension, chronic kidney disease, OSA, and chronic pain who presents with acute onset of nausea and vomiting.  The patient was in [place] [place] and states that he felt nauseated and did not feel like eating anything.  On arrival to ED he was hemodynamically stable, imaging with no acute abnormality.  CT abdomen was also without any acute abnormality.  He appears dehydrated so admitted for IV fluid. His symptoms improved within a day.  No more nausea or vomiting and he was able to tolerate diet well.  Labs remained stable.  He will continue with the rest of his home medications and follow-up with his providers.   Assessment and Plan: * Intractable nausea and vomiting assoc w/ presyncopal episode  Ddx includes gastritis or vertigo, less likely neurologic cause no tinnitus/hearing loss, presyncopal episode c/w vasovagal episode CT head, CXR, ECG, labs all ok in ED CT abdomen pending  Symptom management: zofran, valium Unable to ambulate/move without N/V at this time, ED physician noted nystagmus but otherwise normal neurological exam, if improve but not resolve would consider PT eval for vestibular rehab treatment  CKD (chronic kidney  disease) stage 3, GFR 30-59 ml/min (HCC) Renal fxn ok Will repeat in AM for renal fxn and lytes given vomiting   CAD (coronary artery disease) S/p PCI 2013, on DAPT outpatient, following w/ cardiology Telemetry at least overnight  Continue home meds   Hypothyroidism Seems unlikely to be cause of current symptoms  Last TSH 05/2021 slightly outside normal but no changes to meds at that time Will check TSH but unless markedly abnormal would have him f/u outpatient  History of atrial flutter No active Afib/Aflutter Pt s/p ablation, not on anticoagulation as outpatient, on DAPT (ASA + Plavix) at home, uncertain why based on last cardiology note  Continue telemetry and will hold DAPT for now w/ VTE Rx Ppx  OSA on CPAP CPAP ordered qhs   Consultants: None Procedures performed: None Disposition: Home Diet recommendation:  Discharge Diet Orders (From admission, onward)     Start     Ordered   10/31/21 0000  Diet - low sodium heart healthy        10/31/21 1055           Cardiac diet DISCHARGE MEDICATION: Allergies as of 10/31/2021       Reactions   Questran [cholestyramine]    Patient not aware of an allergy to this medicine.   Atorvastatin    Muscle pain   Gemfibrozil Other (See Comments)   Muscle pain.   Metformin And Related    Dizziness   Rosuvastatin    Muscle pain        Medication List     STOP  taking these medications    Cheratussin AC 100-10 MG/5ML syrup Generic drug: guaiFENesin-codeine   predniSONE 20 MG tablet Commonly known as: DELTASONE       TAKE these medications    acetaminophen 500 MG tablet Commonly known as: TYLENOL Take 1 tablet (500 mg total) by mouth 2 (two) times daily as needed for moderate pain.   amLODipine 5 MG tablet Commonly known as: NORVASC TAKE 1 TABLET(5 MG) BY MOUTH DAILY   aspirin EC 81 MG tablet Take 1 tablet (81 mg total) by mouth daily.   ciclopirox 8 % solution Commonly known as: Penlac Apply topically  at bedtime. Apply over nail and surrounding skin. Apply daily over previous coat. After seven (7) days, may remove with alcohol and continue cycle. What changed:  how much to take when to take this reasons to take this   clopidogrel 75 MG tablet Commonly known as: PLAVIX TAKE 1 TABLET(75 MG) BY MOUTH DAILY AT 6 AM   diphenhydramine-acetaminophen 25-500 MG Tabs tablet Commonly known as: TYLENOL PM Take 2 tablets by mouth at bedtime.   docusate sodium 100 MG capsule Commonly known as: COLACE Take 100 mg by mouth daily.   Eszopiclone 3 MG Tabs Take 1 tablet (3 mg total) by mouth at bedtime as needed. Use sparingly, take immediately before bedtime   ezetimibe 10 MG tablet Commonly known as: ZETIA TAKE 1 TABLET(10 MG) BY MOUTH DAILY   finasteride 5 MG tablet Commonly known as: PROSCAR TAKE 1 TABLET(5 MG) BY MOUTH DAILY   fluocinonide 0.05 % external solution Commonly known as: LIDEX Apply 1 application topically 2 (two) times daily as needed. What changed:  when to take this reasons to take this   furosemide 40 MG tablet Commonly known as: LASIX TAKE 1 TABLET BY MOUTH EVERY DAY. TAKE AN EXTRA TABLET AS NEEDED FOR WEIGHT GAIN OF 3 LBS/DAY OR 5LBS/WEEK   gabapentin 300 MG capsule Commonly known as: NEURONTIN TAKE 2 CAPSULE BY MOUTH AT BEDTIME WITH EXTRA CAPSULE DURING THE DAY AS NEEDED   icosapent Ethyl 1 g capsule Commonly known as: VASCEPA Take 2 capsules (2 g total) by mouth 2 (two) times daily.   ketoconazole 2 % shampoo Commonly known as: NIZORAL APPLY TOPICALLY 3 TIMES WEEKLY AS DIRECTED   ketoconazole 2 % cream Commonly known as: NIZORAL APPLY TO FEET AND FACE AT BEDTIME AS DIRECTED   levothyroxine 150 MCG tablet Commonly known as: SYNTHROID Take 1 tablet (150 mcg total) by mouth daily before breakfast.   Linzess 290 MCG Caps capsule Generic drug: linaclotide TAKE 1 CAPSULE(290 MCG) BY MOUTH DAILY 30 MINUTES BEFORE FIRST MEAL OF THE DAY ON AN EMPTY  STOMACH   lisinopril 10 MG tablet Commonly known as: ZESTRIL TAKE 1 TABLET(10 MG) BY MOUTH DAILY   Melatonin 10 MG Caps Take 10 mg by mouth at bedtime.   meloxicam 15 MG tablet Commonly known as: MOBIC Take 1 tablet (15 mg total) by mouth daily.   mometasone 0.1 % lotion Commonly known as: ELOCON Apply to scalp QHS on Monday, Wednesday, and Friday. What changed:  how much to take how to take this when to take this   nitroGLYCERIN 0.4 MG SL tablet Commonly known as: NITROSTAT Place 1 tablet (0.4 mg total) under the tongue every 5 (five) minutes as needed for chest pain.   ondansetron 4 MG tablet Commonly known as: ZOFRAN Take 1 tablet (4 mg total) by mouth every 6 (six) hours as needed for nausea.   oxyCODONE-acetaminophen 7.5-325  MG tablet Commonly known as: PERCOCET Take 1 tablet by mouth every 6 (six) hours as needed for severe pain.   Praluent 75 MG/ML Soaj Generic drug: Alirocumab INJECT 1 PEN INTO THE SKIN EVERY 14 DAYS   PRESERVISION AREDS 2 PO Take 1 tablet by mouth daily.   CENTRUM SILVER PO Take 1 tablet by mouth daily.   senna 8.6 MG tablet Commonly known as: SENOKOT Take 1 tablet (8.6 mg total) by mouth daily.   tamsulosin 0.4 MG Caps capsule Commonly known as: FLOMAX Take 1 capsule (0.4 mg total) by mouth daily.   tiZANidine 2 MG tablet Commonly known as: ZANAFLEX TAKE 1 TO 2 TABLETS(2 TO 4 MG) BY MOUTH TWICE DAILY AS NEEDED FOR MUSCLE SPASMS   traZODone 50 MG tablet Commonly known as: DESYREL Take 1 tablet (50 mg total) by mouth at bedtime as needed for sleep.   Vitamin D3 25 MCG (1000 UT) Caps Take 1 capsule (1,000 Units total) by mouth daily.        Follow-up Information     Ria Bush, MD. Schedule an appointment as soon as possible for a visit in 1 week(s).   Specialty: Family Medicine Contact information: [address] [place] [address] [phone]                Discharge Exam: Danley Danker Weights    10/30/21 1108  Weight: 122.5 kg   General.  Well-developed gentleman, in no acute distress. Pulmonary.  Lungs clear bilaterally, normal respiratory effort. CV.  Regular rate and rhythm, no JVD, rub or murmur. Abdomen.  Soft, nontender, nondistended, BS positive. CNS.  Alert and oriented .  No focal neurologic deficit. Extremities.  No edema, no cyanosis, pulses intact and symmetrical. Psychiatry.  Judgment and insight appears normal.   Condition at discharge: stable  The results of significant diagnostics from this hospitalization (including imaging, microbiology, ancillary and laboratory) are listed below for reference.   Imaging Studies: DG Chest 2 View  Result Date: 10/30/2021 CLINICAL DATA:  Pt to ED from [place] [place], pt was vomiting in bathroomPt became nauseous at table, went to bathroom started vomiting and was feeling dizzyHx a flutter, has cardiac stents EXAM: CHEST - 2 VIEW COMPARISON:  03/08/2018. FINDINGS: Cardiac silhouette is mildly enlarged. No mediastinal or hilar masses. No evidence of adenopathy. There are prominent bronchovascular/interstitial markings bilaterally, accentuated by low lung volumes. No lung consolidation or convincing edema. No pleural effusion or pneumothorax. Skeletal structures are grossly intact. Spine stimulator leads project within the lower thoracic spinal canal. IMPRESSION: 1. No acute cardiopulmonary disease. Electronically Signed   By: Lajean Manes M.D.   On: 10/30/2021 12:07   CT Head Wo Contrast  Result Date: 10/30/2021 CLINICAL DATA:  Dizziness.  Vomiting. EXAM: CT HEAD WITHOUT CONTRAST TECHNIQUE: Contiguous axial images were obtained from the base of the skull through the vertex without intravenous contrast. RADIATION DOSE REDUCTION: This exam was performed according to the departmental dose-optimization program which includes automated exposure control, adjustment of the mA and/or kV according to patient size and/or use of iterative  reconstruction technique. COMPARISON:  None. FINDINGS: Brain: No evidence of acute infarction, hemorrhage, hydrocephalus, extra-axial collection or mass lesion/mass effect. Prominence of the sulci and ventricles compatible with brain atrophy. There is mild diffuse low-attenuation within the subcortical and periventricular white matter compatible with chronic microvascular disease. Vascular: No hyperdense vessel or unexpected calcification. Skull: Normal. Negative for fracture or focal lesion. Sinuses/Orbits: Postoperative changes from bilateral median antrectomy and bilateral ethmoidectomies.  Partial opacification of the remaining ethmoid air cells. Mild mucosal thickening is noted involving both maxillary sinuses in the sphenoid sinus. Other: None. IMPRESSION: 1. No acute intracranial abnormalities. 2. Chronic small vessel ischemic disease and brain atrophy. 3. Chronic sinus inflammation. Electronically Signed   By: Kerby Moors M.D.   On: 10/30/2021 12:00   CT ABDOMEN PELVIS W CONTRAST  Result Date: 10/30/2021 CLINICAL DATA:  Pt to ED from [place] [place], pt was vomiting in bathroom Pt became nauseous at table, went to bathroom started vomiting and was feeling dizzy EXAM: CT ABDOMEN AND PELVIS WITH CONTRAST TECHNIQUE: Multidetector CT imaging of the abdomen and pelvis was performed using the standard protocol following bolus administration of intravenous contrast. RADIATION DOSE REDUCTION: This exam was performed according to the departmental dose-optimization program which includes automated exposure control, adjustment of the mA and/or kV according to patient size and/or use of iterative reconstruction technique. CONTRAST:  128m OMNIPAQUE IOHEXOL 350 MG/ML SOLN COMPARISON:  04/09/2020 FINDINGS: Lower chest: No acute abnormality. Hepatobiliary: Liver normal in size and overall attenuation. 1.6 cm low-attenuation mass in the caudate lobe. Two tiny low-attenuation masses in the left lobe. These are consistent  with cysts, caudate lobe lesion stable, left lobe lesions not definitively seen on the prior study likely due to the difference in technique. No other liver abnormalities. Dependent gallstones. No gallbladder wall thickening or inflammation. No bile duct dilation. Pancreas: Unremarkable. No pancreatic ductal dilatation or surrounding inflammatory changes. Medial diverticulum from the second portion of the duodenum abuts the pancreatic head, unchanged. Spleen: Normal in size without focal abnormality. Adrenals/Urinary Tract: No adrenal masses. Bilateral, mostly low-attenuation renal masses, 1.6 cm lesion from the upper pole of the left kidney in several lesions on the right, largest from the lower pole, 4.5 cm. These are consistent with cysts, few with relative increased attenuation consistent with proteinaceous content. These are stable no new or suspicious renal masses, no stones and no hydronephrosis. Normal ureters. Normal bladder. Stomach/Bowel: Normal stomach. Small bowel and colon are normal in caliber. No wall thickening. No inflammation. No evidence of appendicitis. Vascular/Lymphatic: Previously excluded infrarenal abdominal aorta. Stent graft is well positioned, new compared to the prior CT. Native aneurysm sac measures 4.2 cm in diameter. No evidence of leakage. No enlarged lymph nodes. Reproductive: Prostate mildly enlarged, 5.4 x 5.0 cm transversely. Other: No abdominal wall hernia or abnormality. No abdominopelvic ascites. Musculoskeletal: No fracture or acute finding. Chronic bilateral pars defects at L5-S1 with a grade 1 anterolisthesis. Marked degenerative changes noted throughout the visualized spine. No bone lesions. IMPRESSION: 1. No acute findings within the abdomen or pelvis. 2. Since the prior CTA dated 04/09/2020 the abdominal aortic aneurysm has been excluded with a stent graft, which is well positioned. No evidence of leakage. 3. Chronic findings include gallstones and renal cysts as well  as significant degenerative changes throughout the visualized spine. Electronically Signed   By: DLajean ManesM.D.   On: 10/30/2021 16:02    Microbiology: Results for orders placed or performed during the hospital encounter of 10/30/21  Resp Panel by RT-PCR (Flu A&B, Covid) Nasopharyngeal Swab     Status: None   Collection Time: 10/30/21 11:28 AM   Specimen: Nasopharyngeal Swab; Nasopharyngeal(NP) swabs in vial transport medium  Result Value Ref Range Status   SARS Coronavirus 2 by RT PCR NEGATIVE NEGATIVE Final    Comment: (NOTE) SARS-CoV-2 target nucleic acids are NOT DETECTED.  The SARS-CoV-2 RNA is generally detectable in upper respiratory specimens during the acute  phase of infection. The lowest concentration of SARS-CoV-2 viral copies this assay can detect is 138 copies/mL. A negative result does not preclude SARS-Cov-2 infection and should not be used as the sole basis for treatment or other patient management decisions. A negative result may occur with  improper specimen collection/handling, submission of specimen other than nasopharyngeal swab, presence of viral mutation(s) within the areas targeted by this assay, and inadequate number of viral copies(<138 copies/mL). A negative result must be combined with clinical observations, patient history, and epidemiological information. The expected result is Negative.  Fact Sheet for Patients:  EntrepreneurPulse.com.au  Fact Sheet for Healthcare Providers:  IncredibleEmployment.be  This test is no t yet approved or cleared by the [place] FDA and  has been authorized for detection and/or diagnosis of SARS-CoV-2 by FDA under an Emergency Use Authorization (EUA). This EUA will remain  in effect (meaning this test can be used) for the duration of the COVID-19 declaration under Section 564(b)(1) of the Act, 21 U.S.C.section 360bbb-3(b)(1), unless the authorization is terminated  or revoked  sooner.       Influenza A by PCR NEGATIVE NEGATIVE Final   Influenza B by PCR NEGATIVE NEGATIVE Final    Comment: (NOTE) The Xpert Xpress SARS-CoV-2/FLU/RSV plus assay is intended as an aid in the diagnosis of influenza from Nasopharyngeal swab specimens and should not be used as a sole basis for treatment. Nasal washings and aspirates are unacceptable for Xpert Xpress SARS-CoV-2/FLU/RSV testing.  Fact Sheet for Patients: EntrepreneurPulse.com.au  Fact Sheet for Healthcare Providers: IncredibleEmployment.be  This test is not yet approved or cleared by the [place] FDA and has been authorized for detection and/or diagnosis of SARS-CoV-2 by FDA under an Emergency Use Authorization (EUA). This EUA will remain in effect (meaning this test can be used) for the duration of the COVID-19 declaration under Section 564(b)(1) of the Act, 21 U.S.C. section 360bbb-3(b)(1), unless the authorization is terminated or revoked.  Performed at [facility], [address] [place], [place] [address]     Labs: CBC: Recent Labs  Lab 10/30/21 1110 10/31/21 0646  WBC 9.8 10.2  HGB 12.1* 11.0*  HCT 37.2* 33.4*  MCV 95.6 94.9  PLT 255 166   Basic Metabolic Panel: Recent Labs  Lab 10/30/21 1110 10/31/21 0547  NA 136 137  K 3.5 4.1  CL 102 103  CO2 25 26  GLUCOSE 198* 100*  BUN 18 18  CREATININE 1.17 1.12  CALCIUM 10.3 9.8   Liver Function Tests: Recent Labs  Lab 10/30/21 1110  AST 21  ALT 20  ALKPHOS 56  BILITOT 0.5  PROT 7.0  ALBUMIN 4.0   CBG: No results for input(s): GLUCAP in the last 168 hours.  Discharge time spent: greater than 30 minutes.  This record has been created using Systems analyst. Errors have been sought and corrected,but may not always be located. Such creation errors do not reflect on the standard of care.   Signed: Lorella Nimrod, MD [facility] 10/31/2021

## 2021-10-31 NOTE — Care Management (Signed)
?  Transition of Care (TOC) Screening Note ? ? ?Patient Details  ?Name: Anthony [name] ?Date of Birth: 12/30/1935 ? ? ?Transition of Care (TOC) CM/SW Contact:    ?Pete Pelt, RN ?Phone Number: ?10/31/2021, 11:40 AM ? ? ? ?Transition of Care Department [facility]) has reviewed patient and no TOC needs have been identified at this time. We will continue to monitor patient advancement through interdisciplinary progression rounds. If new patient transition needs arise, please place a TOC consult. ?  ?

## 2021-11-06 DIAGNOSIS — Z20822 Contact with and (suspected) exposure to covid-19: Secondary | ICD-10-CM | POA: Diagnosis not present

## 2021-11-10 ENCOUNTER — Telehealth: Payer: Self-pay

## 2021-11-10 NOTE — Progress Notes (Signed)
? ? ?Chronic Care Management ?Pharmacy Assistant  ? ?Name: Anthony [name]  MRN: [id number] DOB: March 14, 1936 ? ?Reason for Encounter: CCM (General Adherence) ?  ?Recent office visits:  ?09/12/2021 - Ria Bush, MD - Sleep Apnea. Increase dose: Trazodone 50 mg ? ?Recent consult visits:  ?10/12/2021 - Owens Loffler, MD - Family Med - Spinal Stenosis Lumbar Region. Start: PERCOCET 7.5-325 MG and DELTASONE 20 MG. Stop: NORCO 10-325 MG. ?09/06/2021 Gerrit Halls, PA - Ortho - lumbar radiculopathy. No med changes. ?09/02/2021 Danae Orleans, PA - lumbar radiculopathy. No other information.  ?08/15/2021 - Renee Harder - Orthopedic Surgery - pain in right leg. No other information.  ?08/15/2021 Danae Orleans, PA - lumbar radiculopathy. No other information.  ? ?Hospital visits:  ?Medication Reconciliation was completed by comparing discharge summary, patient?s EMR and Pharmacy list, and upon discussion with patient. ? ?Admitted to the hospital on 10/30/2021 due to nausea and vomiting. Discharge date was 10/31/2021. Discharged from [facility].   ? ?Final Diagnoses: ?Near Syncope ? ?New Medications Started at [facility] Discharge:?? ?-started ZOFRAN 4 MG ? ?Medication Changes at Hospital Discharge: ?-Changed Cheratussin AC 100-10 mg/5 ML syrup ?-Changed Prednisone 20 mg ? ?Medications that remain the same after Hospital Discharge:??  ?-All other medications will remain the same.   ? ?Medications: ?Outpatient Encounter Medications as of 11/10/2021  ?Medication Sig  ? acetaminophen (TYLENOL) 500 MG tablet Take 1 tablet (500 mg total) by mouth 2 (two) times daily as needed for moderate pain.  ? amLODipine (NORVASC) 5 MG tablet TAKE 1 TABLET(5 MG) BY MOUTH DAILY  ? aspirin EC 81 MG tablet Take 1 tablet (81 mg total) by mouth daily.  ? Cholecalciferol (VITAMIN D3) 1000 units CAPS Take 1 capsule (1,000 Units total) by mouth daily.  ? ciclopirox (PENLAC) 8 % solution Apply topically at bedtime. Apply over  nail and surrounding skin. Apply daily over previous coat. After seven (7) days, may remove with alcohol and continue cycle. (Patient taking differently: Apply 1 application topically at bedtime as needed. Apply over nail and surrounding skin. Apply daily over previous coat. After seven (7) days, may remove with alcohol and continue cycle.)  ? clopidogrel (PLAVIX) 75 MG tablet TAKE 1 TABLET(75 MG) BY MOUTH DAILY AT 6 AM  ? diphenhydramine-acetaminophen (TYLENOL PM) 25-500 MG TABS tablet Take 2 tablets by mouth at bedtime.  ? docusate sodium (COLACE) 100 MG capsule Take 100 mg by mouth daily.  ? Eszopiclone 3 MG TABS Take 1 tablet (3 mg total) by mouth at bedtime as needed. Use sparingly, take immediately before bedtime  ? ezetimibe (ZETIA) 10 MG tablet TAKE 1 TABLET(10 MG) BY MOUTH DAILY  ? finasteride (PROSCAR) 5 MG tablet TAKE 1 TABLET(5 MG) BY MOUTH DAILY  ? fluocinonide (LIDEX) 0.05 % external solution Apply 1 application topically 2 (two) times daily as needed. (Patient taking differently: Apply 1 application topically daily as needed (irritation).)  ? furosemide (LASIX) 40 MG tablet TAKE 1 TABLET BY MOUTH EVERY DAY. TAKE AN EXTRA TABLET AS NEEDED FOR WEIGHT GAIN OF 3 LBS/DAY OR 5LBS/WEEK  ? gabapentin (NEURONTIN) 300 MG capsule TAKE 2 CAPSULE BY MOUTH AT BEDTIME WITH EXTRA CAPSULE DURING THE DAY AS NEEDED  ? icosapent Ethyl (VASCEPA) 1 g capsule Take 2 capsules (2 g total) by mouth 2 (two) times daily.  ? ketoconazole (NIZORAL) 2 % cream APPLY TO FEET AND FACE AT BEDTIME AS DIRECTED  ? ketoconazole (NIZORAL) 2 % shampoo APPLY TOPICALLY 3 TIMES WEEKLY AS  DIRECTED  ? levothyroxine (SYNTHROID) 150 MCG tablet Take 1 tablet (150 mcg total) by mouth daily before breakfast.  ? LINZESS 290 MCG CAPS capsule TAKE 1 CAPSULE(290 MCG) BY MOUTH DAILY 30 MINUTES BEFORE FIRST MEAL OF THE DAY ON AN EMPTY STOMACH  ? lisinopril (ZESTRIL) 10 MG tablet TAKE 1 TABLET(10 MG) BY MOUTH DAILY  ? Melatonin 10 MG CAPS Take 10 mg by mouth  at bedtime.  ? meloxicam (MOBIC) 15 MG tablet Take 1 tablet (15 mg total) by mouth daily.  ? mometasone (ELOCON) 0.1 % lotion Apply to scalp QHS on Monday, Wednesday, and Friday. (Patient taking differently: Apply 1 application topically every Monday, Wednesday, and Friday. Apply to scalp QHS on Monday, Wednesday, and Friday.)  ? Multiple Vitamins-Minerals (CENTRUM SILVER PO) Take 1 tablet by mouth daily.  ? Multiple Vitamins-Minerals (PRESERVISION AREDS 2 PO) Take 1 tablet by mouth daily.  ? nitroGLYCERIN (NITROSTAT) 0.4 MG SL tablet Place 1 tablet (0.4 mg total) under the tongue every 5 (five) minutes as needed for chest pain.  ? ondansetron (ZOFRAN) 4 MG tablet Take 1 tablet (4 mg total) by mouth every 6 (six) hours as needed for nausea.  ? oxyCODONE-acetaminophen (PERCOCET) 7.5-325 MG tablet Take 1 tablet by mouth every 6 (six) hours as needed for severe pain.  ? PRALUENT 75 MG/ML SOAJ INJECT 1 PEN INTO THE SKIN EVERY 14 DAYS  ? senna (SENOKOT) 8.6 MG tablet Take 1 tablet (8.6 mg total) by mouth daily.  ? tamsulosin (FLOMAX) 0.4 MG CAPS capsule Take 1 capsule (0.4 mg total) by mouth daily.  ? tiZANidine (ZANAFLEX) 2 MG tablet TAKE 1 TO 2 TABLETS(2 TO 4 MG) BY MOUTH TWICE DAILY AS NEEDED FOR MUSCLE SPASMS  ? traZODone (DESYREL) 50 MG tablet Take 1 tablet (50 mg total) by mouth at bedtime as needed for sleep.  ? ?No facility-administered encounter medications on file as of 11/10/2021.  ? ?Contacted Anthony [name] on 11/10/2021 for general disease state and medication adherence call.  ? ?Patient is not more than 5 days past due for refill on the following medications per chart history: ? ?[facility] Medications: ?Medication Name/mg Last Fill Days Supply ?Lisinopril 10 mg  10/25/2021 90 ? ?What concerns do you have about your medications? Patient was wanting a refill of his Oxycodone-Acetaminophen 7.5-325 mg that Dr. Edilia Bo prescribed for him on 10/12/2021 while Dr. Danise Mina was out. Patient stated he has tried to get  it refilled and it keeps getting denied. I advised patient (per his chart) that Dr. Lorelei Pont would need him to see Dr. Danise Mina as Dr. Lorelei Pont prescribed it as Dr. Danise Mina was out of the office. I asked patient if he would like to make an appointment. Patient declined and stated he would wait until his appointment in May.   ? ?The patient denies side effects with their medications.  ? ?How often do you forget or accidentally miss a dose? Rarely ? ?Are you having any problems getting your medications from your pharmacy? No ? ?Has the cost of your medications been a concern? No ? ?Since last visit with CPP, the following interventions have been made. ?Start: PERCOCET 7.5-325 MG and DELTASONE 20 MG.  ?Stop: NORCO 10-325 MG. ?Increase dose: Trazodone 50 mg ?  ?The patient has had an ED visit since last contact.  ? ?The patient denies problems with their health.  ? ?Patient denies concerns or questions for Charlene Brooke, PharmD at this time.  ? ?Care Gaps: ?Annual wellness visit in last year?  Yes 12/15/2020  ?Most Recent BP reading: 129/79 on 10/31/2021 ? ?Upcoming appointments: ?PCP appointment on 04/21 for AWV ?PCP appointment on 04/26 for labs ?PCP appointment on 05/03 for Physical ?CCM appointment on 07/24 ? ?Charlene Brooke, CPP notified ? ?Marijean Niemann, RMA ?Clinical Pharmacy Assistant ?[phone] ? ? ? ? ? ? ? ? ? ? ?

## 2021-11-11 ENCOUNTER — Other Ambulatory Visit: Payer: Self-pay | Admitting: Family Medicine

## 2021-11-11 NOTE — Telephone Encounter (Signed)
Name of Medication: Oxycodone-APAP ?Name of Pharmacy: [facility] [address] ?Last Fill or Written Date and Quantity: 10/12/21, #30 [prescribed by Dr. Lorelei Pont ?Last Office Visit and Type: 09/12/21, OSA; 10/12/21, lumbar pain w/[place] ?Next Office Visit and Type: 12/28/21, AWV prt 2 ?Last Controlled Substance Agreement Date: 07/04/19 ?Last UDS: 07/04/19 ? ? ? ? ?

## 2021-11-11 NOTE — Telephone Encounter (Signed)
Pt came into lobby stating he need refill on oxyCODONE-acetaminophen (PERCOCET) 7.5-325 MG tablet. I informed pt that I will send you the message and you have to get it called in and pt decided to just sit in lobby and wait and I informed pt I have to sent you the message and someone will reach out to him by phone ?

## 2021-11-14 MED ORDER — OXYCODONE-ACETAMINOPHEN 7.5-325 MG PO TABS: 1.0000 | ORAL_TABLET | Freq: Four times a day (QID) | ORAL | 0 refills | Status: DC | PRN

## 2021-11-14 NOTE — Telephone Encounter (Signed)
ERx 

## 2021-11-21 ENCOUNTER — Other Ambulatory Visit: Payer: Self-pay

## 2021-11-21 DIAGNOSIS — M5416 Radiculopathy, lumbar region: Secondary | ICD-10-CM | POA: Diagnosis not present

## 2021-11-21 DIAGNOSIS — M79604 Pain in right leg: Secondary | ICD-10-CM | POA: Diagnosis not present

## 2021-11-21 DIAGNOSIS — M545 Low back pain, unspecified: Secondary | ICD-10-CM | POA: Diagnosis not present

## 2021-11-21 MED ORDER — LINACLOTIDE 290 MCG PO CAPS: 290.0000 ug | ORAL_CAPSULE | Freq: Every day | ORAL | 1 refills | Status: DC

## 2021-11-21 NOTE — Telephone Encounter (Signed)
Linzess ?Last rx:  01/26/21, #90 ?Last OV: 09/12/21, OSA f/u ?Next OV: 12/28/21, AWV prt 2 ?

## 2021-11-21 NOTE — Telephone Encounter (Signed)
ERx 

## 2021-11-25 DIAGNOSIS — M5416 Radiculopathy, lumbar region: Secondary | ICD-10-CM | POA: Diagnosis not present

## 2021-11-30 DIAGNOSIS — R059 Cough, unspecified: Secondary | ICD-10-CM | POA: Diagnosis not present

## 2021-11-30 DIAGNOSIS — Z20822 Contact with and (suspected) exposure to covid-19: Secondary | ICD-10-CM | POA: Diagnosis not present

## 2021-11-30 DIAGNOSIS — R051 Acute cough: Secondary | ICD-10-CM | POA: Diagnosis not present

## 2021-12-05 ENCOUNTER — Other Ambulatory Visit (INDEPENDENT_AMBULATORY_CARE_PROVIDER_SITE_OTHER): Payer: Self-pay | Admitting: Nurse Practitioner

## 2021-12-05 DIAGNOSIS — I714 Abdominal aortic aneurysm, without rupture, unspecified: Secondary | ICD-10-CM

## 2021-12-06 ENCOUNTER — Ambulatory Visit (INDEPENDENT_AMBULATORY_CARE_PROVIDER_SITE_OTHER): Payer: Medicare Other

## 2021-12-06 ENCOUNTER — Ambulatory Visit (INDEPENDENT_AMBULATORY_CARE_PROVIDER_SITE_OTHER): Payer: Medicare Other | Admitting: Vascular Surgery

## 2021-12-06 DIAGNOSIS — I714 Abdominal aortic aneurysm, without rupture, unspecified: Secondary | ICD-10-CM | POA: Diagnosis not present

## 2021-12-07 DIAGNOSIS — Z20822 Contact with and (suspected) exposure to covid-19: Secondary | ICD-10-CM | POA: Diagnosis not present

## 2021-12-12 DIAGNOSIS — Z20822 Contact with and (suspected) exposure to covid-19: Secondary | ICD-10-CM | POA: Diagnosis not present

## 2021-12-14 ENCOUNTER — Telehealth: Payer: Self-pay

## 2021-12-14 ENCOUNTER — Encounter (INDEPENDENT_AMBULATORY_CARE_PROVIDER_SITE_OTHER): Payer: Self-pay | Admitting: *Deleted

## 2021-12-14 MED ORDER — LEVOTHYROXINE SODIUM 150 MCG PO TABS
150.0000 ug | ORAL_TABLET | Freq: Every day | ORAL | 0 refills | Status: DC
Start: 2021-12-14 — End: 2022-03-01

## 2021-12-14 NOTE — Telephone Encounter (Signed)
E-scribed refill 

## 2021-12-16 ENCOUNTER — Telehealth: Payer: Self-pay

## 2021-12-16 ENCOUNTER — Ambulatory Visit: Payer: Medicare Other

## 2021-12-16 NOTE — Telephone Encounter (Signed)
Called Patient for [name] no answer , unable to leave VM .  Patient may reschedule for next available appointment. ? ? ?L.Sabine Tenenbaum,LPN  ?

## 2021-12-17 ENCOUNTER — Other Ambulatory Visit: Payer: Self-pay | Admitting: Family Medicine

## 2021-12-17 DIAGNOSIS — E559 Vitamin D deficiency, unspecified: Secondary | ICD-10-CM

## 2021-12-17 DIAGNOSIS — N183 Chronic kidney disease, stage 3 unspecified: Secondary | ICD-10-CM

## 2021-12-17 DIAGNOSIS — E039 Hypothyroidism, unspecified: Secondary | ICD-10-CM

## 2021-12-17 DIAGNOSIS — E782 Mixed hyperlipidemia: Secondary | ICD-10-CM

## 2021-12-21 ENCOUNTER — Other Ambulatory Visit (INDEPENDENT_AMBULATORY_CARE_PROVIDER_SITE_OTHER): Payer: Medicare Other

## 2021-12-21 DIAGNOSIS — E782 Mixed hyperlipidemia: Secondary | ICD-10-CM | POA: Diagnosis not present

## 2021-12-21 DIAGNOSIS — E039 Hypothyroidism, unspecified: Secondary | ICD-10-CM

## 2021-12-21 DIAGNOSIS — Z20822 Contact with and (suspected) exposure to covid-19: Secondary | ICD-10-CM | POA: Diagnosis not present

## 2021-12-21 DIAGNOSIS — N183 Chronic kidney disease, stage 3 unspecified: Secondary | ICD-10-CM | POA: Diagnosis not present

## 2021-12-21 DIAGNOSIS — E559 Vitamin D deficiency, unspecified: Secondary | ICD-10-CM

## 2021-12-21 LAB — CBC WITH DIFFERENTIAL/PLATELET
Basophils Absolute: 0.2 10*3/uL — ABNORMAL HIGH (ref 0.0–0.1)
Basophils Relative: 2.6 % (ref 0.0–3.0)
Eosinophils Absolute: 1.2 10*3/uL — ABNORMAL HIGH (ref 0.0–0.7)
Eosinophils Relative: 14.6 % — ABNORMAL HIGH (ref 0.0–5.0)
HCT: 35.6 % — ABNORMAL LOW (ref 39.0–52.0)
Hemoglobin: 12 g/dL — ABNORMAL LOW (ref 13.0–17.0)
Lymphocytes Relative: 32.8 % (ref 12.0–46.0)
Lymphs Abs: 2.7 10*3/uL (ref 0.7–4.0)
MCHC: 33.8 g/dL (ref 30.0–36.0)
MCV: 94 fl (ref 78.0–100.0)
Monocytes Absolute: 0.9 10*3/uL (ref 0.1–1.0)
Monocytes Relative: 10.8 % (ref 3.0–12.0)
Neutro Abs: 3.3 10*3/uL (ref 1.4–7.7)
Neutrophils Relative %: 39.2 % — ABNORMAL LOW (ref 43.0–77.0)
Platelets: 328 10*3/uL (ref 150.0–400.0)
RBC: 3.79 Mil/uL — ABNORMAL LOW (ref 4.22–5.81)
RDW: 15.4 % (ref 11.5–15.5)
WBC: 8.3 10*3/uL (ref 4.0–10.5)

## 2021-12-21 LAB — LIPID PANEL
Cholesterol: 204 mg/dL — ABNORMAL HIGH (ref 0–200)
HDL: 41.7 mg/dL (ref >39.00)
Total CHOL/HDL Ratio: 5
Triglycerides: 516 mg/dL — ABNORMAL HIGH (ref 0.0–149.0)

## 2021-12-21 LAB — COMPREHENSIVE METABOLIC PANEL
ALT: 13 U/L (ref 0–53)
AST: 15 U/L (ref 0–37)
Albumin: 4 g/dL (ref 3.5–5.2)
Alkaline Phosphatase: 67 U/L (ref 39–117)
BUN: 25 mg/dL — ABNORMAL HIGH (ref 6–23)
CO2: 31 mEq/L (ref 19–32)
Calcium: 10.3 mg/dL (ref 8.4–10.5)
Chloride: 102 mEq/L (ref 96–112)
Creatinine, Ser: 1.31 mg/dL (ref 0.40–1.50)
GFR: 50.09 mL/min — ABNORMAL LOW (ref >60.00)
Glucose, Bld: 121 mg/dL — ABNORMAL HIGH (ref 70–99)
Potassium: 4.2 mEq/L (ref 3.5–5.1)
Sodium: 139 mEq/L (ref 135–145)
Total Bilirubin: 0.6 mg/dL (ref 0.2–1.2)
Total Protein: 6.8 g/dL (ref 6.0–8.3)

## 2021-12-21 LAB — VITAMIN D 25 HYDROXY (VIT D DEFICIENCY, FRACTURES): VITD: 30.62 ng/mL (ref 30.00–100.00)

## 2021-12-21 LAB — T4, FREE: Free T4: 0.74 ng/dL (ref 0.60–1.60)

## 2021-12-21 LAB — LDL CHOLESTEROL, DIRECT: Direct LDL: 55 mg/dL

## 2021-12-21 LAB — TSH: TSH: 4.81 u[IU]/mL (ref 0.35–5.50)

## 2021-12-23 ENCOUNTER — Telehealth: Payer: Self-pay

## 2021-12-23 MED ORDER — FINASTERIDE 5 MG PO TABS: ORAL_TABLET | ORAL | 0 refills | Status: DC

## 2021-12-23 NOTE — Telephone Encounter (Signed)
E-scribed refill 

## 2021-12-24 ENCOUNTER — Other Ambulatory Visit: Payer: Self-pay

## 2021-12-24 DIAGNOSIS — Z20828 Contact with and (suspected) exposure to other viral communicable diseases: Secondary | ICD-10-CM | POA: Diagnosis not present

## 2021-12-24 DIAGNOSIS — Z1152 Encounter for screening for COVID-19: Secondary | ICD-10-CM | POA: Diagnosis not present

## 2021-12-24 NOTE — Telephone Encounter (Signed)
MEDICATION:tiZANidine (ZANAFLEX) 2 MG tablet ? ?PHARMACY: [facility]  ? ?Comments:  ? ?**Let patient know to contact pharmacy at the end of the day to make sure medication is ready. ** ? ?** Please notify patient to allow 48-72 hours to process** ? ?**Encourage patient to contact the pharmacy for refills or they can request refills through [facility]** ?  ?

## 2021-12-26 MED ORDER — TIZANIDINE HCL 2 MG PO TABS: ORAL_TABLET | ORAL | 0 refills | Status: DC

## 2021-12-26 NOTE — Telephone Encounter (Signed)
Refill request Zanaflex ?Last refill 09/24/21 #30 ?Last office visit 10/12/21 Dr. Lorelei Pont ?Upcoming appointment 12/28/21 ?

## 2021-12-26 NOTE — Telephone Encounter (Signed)
ERx 

## 2021-12-28 ENCOUNTER — Encounter: Payer: Self-pay | Admitting: Family Medicine

## 2021-12-28 ENCOUNTER — Ambulatory Visit (INDEPENDENT_AMBULATORY_CARE_PROVIDER_SITE_OTHER): Payer: Medicare Other | Admitting: Family Medicine

## 2021-12-28 VITALS — BP 142/90 | HR 91 | Temp 98.1°F | Ht 72.0 in | Wt 275.1 lb

## 2021-12-28 DIAGNOSIS — I2510 Atherosclerotic heart disease of native coronary artery without angina pectoris: Secondary | ICD-10-CM

## 2021-12-28 DIAGNOSIS — F5104 Psychophysiologic insomnia: Secondary | ICD-10-CM

## 2021-12-28 DIAGNOSIS — I5032 Chronic diastolic (congestive) heart failure: Secondary | ICD-10-CM | POA: Diagnosis not present

## 2021-12-28 DIAGNOSIS — Z0000 Encounter for general adult medical examination without abnormal findings: Secondary | ICD-10-CM

## 2021-12-28 DIAGNOSIS — G4733 Obstructive sleep apnea (adult) (pediatric): Secondary | ICD-10-CM

## 2021-12-28 DIAGNOSIS — K5909 Other constipation: Secondary | ICD-10-CM | POA: Diagnosis not present

## 2021-12-28 DIAGNOSIS — D721 Eosinophilia, unspecified: Secondary | ICD-10-CM

## 2021-12-28 DIAGNOSIS — N1831 Chronic kidney disease, stage 3a: Secondary | ICD-10-CM

## 2021-12-28 DIAGNOSIS — E039 Hypothyroidism, unspecified: Secondary | ICD-10-CM | POA: Diagnosis not present

## 2021-12-28 DIAGNOSIS — Z7189 Other specified counseling: Secondary | ICD-10-CM

## 2021-12-28 DIAGNOSIS — I714 Abdominal aortic aneurysm, without rupture, unspecified: Secondary | ICD-10-CM

## 2021-12-28 DIAGNOSIS — F3340 Major depressive disorder, recurrent, in remission, unspecified: Secondary | ICD-10-CM

## 2021-12-28 DIAGNOSIS — R7303 Prediabetes: Secondary | ICD-10-CM

## 2021-12-28 DIAGNOSIS — M545 Low back pain, unspecified: Secondary | ICD-10-CM | POA: Diagnosis not present

## 2021-12-28 DIAGNOSIS — Z9889 Other specified postprocedural states: Secondary | ICD-10-CM

## 2021-12-28 DIAGNOSIS — Z9989 Dependence on other enabling machines and devices: Secondary | ICD-10-CM

## 2021-12-28 DIAGNOSIS — M48061 Spinal stenosis, lumbar region without neurogenic claudication: Secondary | ICD-10-CM

## 2021-12-28 DIAGNOSIS — I10 Essential (primary) hypertension: Secondary | ICD-10-CM | POA: Diagnosis not present

## 2021-12-28 DIAGNOSIS — T402X5A Adverse effect of other opioids, initial encounter: Secondary | ICD-10-CM

## 2021-12-28 DIAGNOSIS — R739 Hyperglycemia, unspecified: Secondary | ICD-10-CM

## 2021-12-28 DIAGNOSIS — K5903 Drug induced constipation: Secondary | ICD-10-CM | POA: Diagnosis not present

## 2021-12-28 DIAGNOSIS — G250 Essential tremor: Secondary | ICD-10-CM

## 2021-12-28 DIAGNOSIS — G8929 Other chronic pain: Secondary | ICD-10-CM

## 2021-12-28 DIAGNOSIS — E782 Mixed hyperlipidemia: Secondary | ICD-10-CM | POA: Diagnosis not present

## 2021-12-28 DIAGNOSIS — Z9689 Presence of other specified functional implants: Secondary | ICD-10-CM

## 2021-12-28 DIAGNOSIS — E559 Vitamin D deficiency, unspecified: Secondary | ICD-10-CM

## 2021-12-28 DIAGNOSIS — Z8679 Personal history of other diseases of the circulatory system: Secondary | ICD-10-CM

## 2021-12-28 LAB — POCT GLYCOSYLATED HEMOGLOBIN (HGB A1C): Hemoglobin A1C: 6.2 % — AB (ref 4.0–5.6)

## 2021-12-28 MED ORDER — EZETIMIBE 10 MG PO TABS: 10.0000 mg | ORAL_TABLET | Freq: Every day | ORAL | 3 refills | Status: AC

## 2021-12-28 MED ORDER — LISINOPRIL 10 MG PO TABS: ORAL_TABLET | ORAL | 3 refills | Status: DC

## 2021-12-28 NOTE — Progress Notes (Signed)
? ? Patient ID: Anthony [name], male    DOB: 1936/03/03, 86 y.o.   MRN: [id number] ? ?This visit was conducted in person. ? ?BP (!) 142/90   Pulse 91   Temp 98.1 ?F (36.7 ?C) (Temporal)   Ht 6' (1.829 m)   Wt 275 lb 2 oz (124.8 kg)   SpO2 97%   BMI 37.31 kg/m?   ? ?CC: [name] ?Subjective:  ? ?HPI: ?Anthony [name] is a 86 y.o. male presenting on 12/28/2021 for Medicare Wellness ? ? ?Did not see health advisor this year.  ? ?Hearing Screening  ? '250Hz'$  '500Hz'$  '1000Hz'$  '2000Hz'$  '4000Hz'$   ?Right ear 40 40 0 40 0  ?Left ear 40 40 0 40 0  ? ?Vision Screening  ? Right eye Left eye Both eyes  ?Without correction '20/40 20/40 20/40 '$  ?With correction     ?  ?[place] Office Visit from 12/28/2021 in [place] [facility]  ?PHQ-2 Total Score 3  ? ?  ?  ? ?  12/28/2021  ? 10:34 AM 12/15/2020  ?  1:58 PM 10/30/2019  ?  2:50 PM 10/24/2018  ? 11:05 AM 02/07/2018  ? 11:53 AM  ?Fall Risk   ?Falls in the past year? 1 0 0 0 No  ?Number falls in past yr: 1 0 0    ?Injury with Fall? 0 0 0    ?Risk for fall due to : History of fall(s);Impaired mobility Medication side effect Medication side effect    ?Follow up  Falls evaluation completed;Falls prevention discussed Falls evaluation completed;Falls prevention discussed    ?Fall at home - lost balance while removing shoe.  ? ?Chronic eosinophilia saw hematology thought benign cause planned monitoring.  ? ?Notes difficulty with writing over the past year - due to shaking tremor.  ? ?OSA on CPAP - DME supply company is [facility] fax [phone]. ?Adult polysomnography test completed at [facility] 06/23/2021 (Dr Richardson Landry) - dx mild OSA with AHI 9.2/hr, RDI 24.7/hr, O2 sat nadir of 74%. Multifocal PVCs and occasional PACs also noted. Elevated PLMS index of 112.8/hr - consider re evaluating once OSA controlled.  ? ?Known severe spinal stenosis with severe foraminal stenosis bilaterally, unable to have MRI given spinal cord stimulator. Last saw Dr Lorelei Pont sports medicine - treated with  oxycodone 7.'5mg'$  as well as zanaflex. Recommended neurosurgery evaluation. Received steroid course through ortho, last seen 08/2021. Notes benefit with oral steroids. Last received #30 percocet 7.5/325 11/14/2021. Patient is hesitant to use opiate due to possible worsening constipation in h/o same. Also on tizanidine '2mg'$  PRN #30/mo  ? ?Preventative: ?Colon cancer screening - normal colonoscopy 2007. Denies bowel changes or blood in stool. No known fmhx colon cancer. Age out.  ?Prostate cancer screening - aged out  ?Lung cancer screening - not eligible  ?Flu shot - yearly ?[place] 08/2019, 09/2019, booster 06/2020, 01/2021 ?Td 2013 ?Pneumovax 2012, 2013, prevnar-13 2015 ?zostavax 2015  ?shingrix - discussed  ?Advanced directive discussion - sons would be HCPOA. Doesn't have living will. Packet previously provided ?Seat belt use discussed.  ?Sunscreen use discussed, no changing moles on skin. Sees derm later this month.  ?Non smoker ?Alcohol - none  ?Doesn't see dentist - full implants ?Eye exam has not seen  ?Bowel - constipation - well managed with daily linzess ?Bladder - no incontinence  ? ?Widower (wife passed 08/2014) ?Lives alone, GF Miss Anthony [name] sometimes (she was nurse) ?Son nearby ?Occ: retired, used to [employer] store ?  Edu: HS ?Activity: tries to do exercises at home - stationary bike ?Diet: good water, fruits/vegetables daily ?   ? ?Relevant past medical, surgical, family and social history reviewed and updated as indicated. Interim medical history since our last visit reviewed. ?Allergies and medications reviewed and updated. ?Outpatient Medications Prior to Visit  ?Medication Sig Dispense Refill  ? acetaminophen (TYLENOL) 500 MG tablet Take 1 tablet (500 mg total) by mouth 2 (two) times daily as needed for moderate pain.    ? amLODipine (NORVASC) 5 MG tablet TAKE 1 TABLET(5 MG) BY MOUTH DAILY 90 tablet 1  ? aspirin EC 81 MG tablet Take 1 tablet (81 mg total) by mouth daily.    ? Cholecalciferol  (VITAMIN D3) 1000 units CAPS Take 1 capsule (1,000 Units total) by mouth daily. 30 capsule   ? ciclopirox (PENLAC) 8 % solution Apply topically at bedtime. Apply over nail and surrounding skin. Apply daily over previous coat. After seven (7) days, may remove with alcohol and continue cycle. (Patient taking differently: Apply 1 application. topically at bedtime as needed. Apply over nail and surrounding skin. Apply daily over previous coat. After seven (7) days, may remove with alcohol and continue cycle.) 6.6 mL 0  ? clopidogrel (PLAVIX) 75 MG tablet TAKE 1 TABLET(75 MG) BY MOUTH DAILY AT 6 AM 90 tablet 3  ? diphenhydramine-acetaminophen (TYLENOL PM) 25-500 MG TABS tablet Take 2 tablets by mouth at bedtime.    ? docusate sodium (COLACE) 100 MG capsule Take 100 mg by mouth daily.    ? Eszopiclone 3 MG TABS Take 1 tablet (3 mg total) by mouth at bedtime as needed. Use sparingly, take immediately before bedtime 30 tablet 0  ? finasteride (PROSCAR) 5 MG tablet TAKE 1 TABLET(5 MG) BY MOUTH DAILY 90 tablet 0  ? fluocinonide (LIDEX) 0.05 % external solution Apply 1 application topically 2 (two) times daily as needed. (Patient taking differently: Apply 1 application. topically daily as needed (irritation).) 60 mL 2  ? furosemide (LASIX) 40 MG tablet TAKE 1 TABLET BY MOUTH EVERY DAY. TAKE AN EXTRA TABLET AS NEEDED FOR WEIGHT GAIN OF 3 LBS/DAY OR 5LBS/WEEK 135 tablet 3  ? gabapentin (NEURONTIN) 300 MG capsule TAKE 2 CAPSULE BY MOUTH AT BEDTIME WITH EXTRA CAPSULE DURING THE DAY AS NEEDED 200 capsule 1  ? icosapent Ethyl (VASCEPA) 1 g capsule Take 2 capsules (2 g total) by mouth 2 (two) times daily. 360 capsule 3  ? ketoconazole (NIZORAL) 2 % cream APPLY TO FEET AND FACE AT BEDTIME AS DIRECTED 60 g 3  ? ketoconazole (NIZORAL) 2 % shampoo APPLY TOPICALLY 3 TIMES WEEKLY AS DIRECTED 120 mL 2  ? levothyroxine (SYNTHROID) 150 MCG tablet Take 1 tablet (150 mcg total) by mouth daily before breakfast. 90 tablet 0  ? linaclotide  (LINZESS) 290 MCG CAPS capsule Take 1 capsule (290 mcg total) by mouth daily before breakfast. 90 capsule 1  ? Melatonin 10 MG CAPS Take 10 mg by mouth at bedtime.    ? meloxicam (MOBIC) 15 MG tablet Take 1 tablet (15 mg total) by mouth daily.    ? mometasone (ELOCON) 0.1 % lotion Apply to scalp QHS on Monday, Wednesday, and Friday. (Patient taking differently: Apply 1 application. topically every Monday, Wednesday, and Friday. Apply to scalp QHS on Monday, Wednesday, and Friday.) 60 mL 3  ? Multiple Vitamins-Minerals (CENTRUM SILVER PO) Take 1 tablet by mouth daily.    ? Multiple Vitamins-Minerals (PRESERVISION AREDS 2 PO) Take 1 tablet by mouth daily.    ?  nitroGLYCERIN (NITROSTAT) 0.4 MG SL tablet Place 1 tablet (0.4 mg total) under the tongue every 5 (five) minutes as needed for chest pain. 25 tablet 0  ? ondansetron (ZOFRAN) 4 MG tablet Take 1 tablet (4 mg total) by mouth every 6 (six) hours as needed for nausea. 20 tablet 0  ? oxyCODONE-acetaminophen (PERCOCET) 7.5-325 MG tablet Take 1 tablet by mouth every 6 (six) hours as needed for severe pain. 30 tablet 0  ? PRALUENT 75 MG/ML SOAJ INJECT 1 PEN INTO THE SKIN EVERY 14 DAYS 2 mL 11  ? senna (SENOKOT) 8.6 MG tablet Take 1 tablet (8.6 mg total) by mouth daily. 30 tablet 3  ? tamsulosin (FLOMAX) 0.4 MG CAPS capsule Take 1 capsule (0.4 mg total) by mouth daily. 90 capsule 3  ? tiZANidine (ZANAFLEX) 2 MG tablet TAKE 1 TO 2 TABLETS(2 TO 4 MG) BY MOUTH TWICE DAILY AS NEEDED FOR MUSCLE SPASMS 30 tablet 0  ? traZODone (DESYREL) 50 MG tablet Take 1 tablet (50 mg total) by mouth at bedtime as needed for sleep.    ? ezetimibe (ZETIA) 10 MG tablet TAKE 1 TABLET(10 MG) BY MOUTH DAILY 90 tablet 3  ? lisinopril (ZESTRIL) 10 MG tablet TAKE 1 TABLET(10 MG) BY MOUTH DAILY 90 tablet 3  ? ?No facility-administered medications prior to visit.  ?  ? ?Per HPI unless specifically indicated in ROS section below ?Review of Systems ? ?Objective:  ?BP (!) 142/90   Pulse 91   Temp 98.1  ?F (36.7 ?C) (Temporal)   Ht 6' (1.829 m)   Wt 275 lb 2 oz (124.8 kg)   SpO2 97%   BMI 37.31 kg/m?   ?Wt Readings from Last 3 Encounters:  ?12/28/21 275 lb 2 oz (124.8 kg)  ?10/30/21 270 lb (122.5 kg)  ?10/12/21 273 lb

## 2021-12-28 NOTE — Patient Instructions (Addendum)
A1c today.  ?Schedule eye exam.  ?Your triglycerides are too high - increase fatty fish in the diet. We may recheck this next visit.  ?Watch kidney function - try to limit anti inflammatories (ibuprofen, meloxicam).  ?Consider return to neurosurgery and/or pain clinic ?If interested, check with pharmacy about new 2 shot shingles series (shingrix).  ?Good to see you today.  ?Return in 4 months for follow up visit.  ? ?Health Maintenance After Age 84 ?After age 42, you are at a higher risk for certain long-term diseases and infections as well as injuries from falls. Falls are a major cause of broken bones and head injuries in people who are older than age 93. Getting regular preventive care can help to keep you healthy and well. Preventive care includes getting regular testing and making lifestyle changes as recommended by your health care provider. Talk with your health care provider about: ?Which screenings and tests you should have. A screening is a test that checks for a disease when you have no symptoms. ?A diet and exercise plan that is right for you. ?What should I know about screenings and tests to prevent falls? ?Screening and testing are the best ways to find a health problem early. Early diagnosis and treatment give you the best chance of managing medical conditions that are common after age 24. Certain conditions and lifestyle choices may make you more likely to have a fall. Your health care provider may recommend: ?Regular vision checks. Poor vision and conditions such as cataracts can make you more likely to have a fall. If you wear glasses, make sure to get your prescription updated if your vision changes. ?Medicine review. Work with your health care provider to regularly review all of the medicines you are taking, including over-the-counter medicines. Ask your health care provider about any side effects that may make you more likely to have a fall. Tell your health care provider if any medicines that  you take make you feel dizzy or sleepy. ?Strength and balance checks. Your health care provider may recommend certain tests to check your strength and balance while standing, walking, or changing positions. ?Foot health exam. Foot pain and numbness, as well as not wearing proper footwear, can make you more likely to have a fall. ?Screenings, including: ?Osteoporosis screening. Osteoporosis is a condition that causes the bones to get weaker and break more easily. ?Blood pressure screening. Blood pressure changes and medicines to control blood pressure can make you feel dizzy. ?Depression screening. You may be more likely to have a fall if you have a fear of falling, feel depressed, or feel unable to do activities that you used to do. ?Alcohol use screening. Using too much alcohol can affect your balance and may make you more likely to have a fall. ?Follow these instructions at home: ?Lifestyle ?Do not drink alcohol if: ?Your health care provider tells you not to drink. ?If you drink alcohol: ?Limit how much you have to: ?0-1 drink a day for women. ?0-2 drinks a day for men. ?Know how much alcohol is in your drink. In the [place] one drink equals one 12 oz bottle of beer (355 mL), one 5 oz glass of wine (148 mL), or one 1? oz glass of hard liquor (44 mL). ?Do not use any products that contain nicotine or tobacco. These products include cigarettes, chewing tobacco, and vaping devices, such as e-cigarettes. If you need help quitting, ask your health care provider. ?Activity ? ?Follow a regular exercise program to stay  fit. This will help you maintain your balance. Ask your health care provider what types of exercise are appropriate for you. ?If you need a cane or walker, use it as recommended by your health care provider. ?Wear supportive shoes that have nonskid soles. ?Safety ? ?Remove any tripping hazards, such as rugs, cords, and clutter. ?Install safety equipment such as grab bars in bathrooms and safety rails on  stairs. ?Keep rooms and walkways well-lit. ?General instructions ?Talk with your health care provider about your risks for falling. Tell your health care provider if: ?You fall. Be sure to tell your health care provider about all falls, even ones that seem minor. ?You feel dizzy, tiredness (fatigue), or off-balance. ?Take over-the-counter and prescription medicines only as told by your health care provider. These include supplements. ?Eat a healthy diet and maintain a healthy weight. A healthy diet includes low-fat dairy products, low-fat (lean) meats, and fiber from whole grains, beans, and lots of fruits and vegetables. ?Stay current with your vaccines. ?Schedule regular health, dental, and eye exams. ?Summary ?Having a healthy lifestyle and getting preventive care can help to protect your health and wellness after age 77. ?Screening and testing are the best way to find a health problem early and help you avoid having a fall. Early diagnosis and treatment give you the best chance for managing medical conditions that are more common for people who are older than age 46. ?Falls are a major cause of broken bones and head injuries in people who are older than age 68. Take precautions to prevent a fall at home. ?Work with your health care provider to learn what changes you can make to improve your health and wellness and to prevent falls. ?This information is not intended to replace advice given to you by your health care provider. Make sure you discuss any questions you have with your health care provider. ?Document Revised: 01/03/2021 Document Reviewed: 01/03/2021 ?Elsevier Patient Education ? [place]. ? ?

## 2021-12-29 DIAGNOSIS — Z0000 Encounter for general adult medical examination without abnormal findings: Secondary | ICD-10-CM | POA: Insufficient documentation

## 2021-12-29 DIAGNOSIS — R7303 Prediabetes: Secondary | ICD-10-CM | POA: Insufficient documentation

## 2021-12-29 NOTE — Assessment & Plan Note (Signed)
Chronic, stable on current regimen.  No changes indicated. ?

## 2021-12-29 NOTE — Assessment & Plan Note (Signed)
Status post endovascular repair. ?He already has vascular surgery follow-up scheduled for later this week. ?

## 2021-12-29 NOTE — Assessment & Plan Note (Signed)
Levels adequate on 1000 units daily replacement. ?

## 2021-12-29 NOTE — Assessment & Plan Note (Signed)
History of this.  Now off regular opiate use. ?Continue Linzess. ?

## 2021-12-29 NOTE — Assessment & Plan Note (Signed)
Notes worsening tremor-this could be due to lower gabapentin dose. ?

## 2021-12-29 NOTE — Assessment & Plan Note (Signed)
Saw heme, rec PCP continue to monitor this.  ?

## 2021-12-29 NOTE — Assessment & Plan Note (Signed)
Known severe spinal stenosis and foraminal stenosis bilaterally status post spine stimulator placement.  Has also had lumbar nerve ablations. ?He recently saw sports medicine Dr. Edilia Bo who recommended neurosurgery and/or pain clinic involvement. ?Patient declines referral today but will research options and let me know his decision. ?

## 2021-12-29 NOTE — Assessment & Plan Note (Signed)

## 2021-12-29 NOTE — Assessment & Plan Note (Signed)
Advanced directive discussion - sons would be HCPOA. Doesn't have living will. Packet previously provided ?

## 2021-12-29 NOTE — Assessment & Plan Note (Signed)
Chronic, stable on current regimen-continue levothyroxine 150 mcg daily ?

## 2021-12-29 NOTE — Assessment & Plan Note (Signed)
Continue CPAP, latest sleep study 05/2021. ?

## 2021-12-29 NOTE — Assessment & Plan Note (Signed)
Continue Linzess with benefit. ?

## 2021-12-29 NOTE — Assessment & Plan Note (Signed)
Stable period off medication.  

## 2021-12-29 NOTE — Assessment & Plan Note (Addendum)
[place] CSRS reviewed. ?Last received percocet 7.'5mg'$  #30 11/14/2021. ?Patient is hesitant for ongoing opiate use due to concern over constipation.  We will continue to tizanidine 2 mg with Percocet as needed. ?

## 2021-12-29 NOTE — Assessment & Plan Note (Signed)
Encouraged healthy diet and affect sustainable weight loss.  He asks about eligibility for GLP-1 RA-we will check A1c ?

## 2021-12-29 NOTE — Assessment & Plan Note (Addendum)
Kidney function mildly deteriorated - he has been taking meloxicam through ortho - discussed caution with NSAID in CKD. Also recommend good water intake.  ?

## 2021-12-29 NOTE — Assessment & Plan Note (Signed)
Chronic, continue trazodone, Tylenol PM regular as needed Lunesta use. ?

## 2021-12-29 NOTE — Assessment & Plan Note (Signed)
Seems euvolemic.  

## 2021-12-29 NOTE — Assessment & Plan Note (Addendum)
Statin intolerance.  Triglycerides severely elevated.  He should be on Praluent, ezetimibe and Vascepa -we will need to verify he continues these medications.  Zetia refilled today.  Followed by cardiology. ?The ASCVD Risk score (Arnett DK, et al., 2019) failed to calculate for the following reasons: ?  The 2019 ASCVD risk score is only valid for ages 42 to 23 ? ?

## 2021-12-29 NOTE — Assessment & Plan Note (Addendum)
A1c consistent with this. Encouraged limiting added sugar in diet.  ?

## 2021-12-30 ENCOUNTER — Ambulatory Visit (INDEPENDENT_AMBULATORY_CARE_PROVIDER_SITE_OTHER): Payer: Medicare Other | Admitting: Vascular Surgery

## 2021-12-30 DIAGNOSIS — Z20822 Contact with and (suspected) exposure to covid-19: Secondary | ICD-10-CM | POA: Diagnosis not present

## 2022-01-02 DIAGNOSIS — Z20822 Contact with and (suspected) exposure to covid-19: Secondary | ICD-10-CM | POA: Diagnosis not present

## 2022-01-04 ENCOUNTER — Encounter: Payer: Self-pay | Admitting: Family Medicine

## 2022-01-04 ENCOUNTER — Telehealth: Payer: Self-pay | Admitting: Family Medicine

## 2022-01-04 DIAGNOSIS — G8929 Other chronic pain: Secondary | ICD-10-CM

## 2022-01-04 DIAGNOSIS — M4316 Spondylolisthesis, lumbar region: Secondary | ICD-10-CM

## 2022-01-04 DIAGNOSIS — Z9689 Presence of other specified functional implants: Secondary | ICD-10-CM

## 2022-01-04 MED ORDER — OXYCODONE-ACETAMINOPHEN 7.5-325 MG PO TABS: 1.0000 | ORAL_TABLET | Freq: Three times a day (TID) | ORAL | 0 refills | Status: DC | PRN

## 2022-01-04 NOTE — Telephone Encounter (Signed)
Noted.  (See other 01/04/22 phn note.)  ?

## 2022-01-04 NOTE — Telephone Encounter (Signed)
Pt is in lobby ( walk in ) said he want to talk to [facility] ?

## 2022-01-04 NOTE — Telephone Encounter (Signed)
Pt stated he need a refill on hydrocortisone refill and pt stated he needs 90 . Pt stated he have 1 left to get him through the weekend ?

## 2022-01-04 NOTE — Telephone Encounter (Signed)
Known severe spinal stenosis and foraminal stenosis bilaterally, multilevel per CT myelogram 2019. He still has spinal cord stimulator battery in place, declined removal.  ?No significant benefit with norco. Percocet 7.5/'325mg'$  started 09/2021 ([facility]). Last filled #30 3/0/2023. ? ?At visit last week, I thought he had said he did not want to use opiate regularly due to worsening constipation. Does he want to start taking percocet more regularly? If so that is fine but we will need to see him Q3 mo for chronic pain management office visits. Alternatively we can refer him back to pain management to establish with them.  ? ?Dr Lorelei Pont had recommended return to neurosurgery for ongoing evaluation/management - is he interested in this? ? ?I have sent in #30 percocet 7.5/'325mg'$  for next refill.  ?I don't recommend 3 month supply of pain medication.  ?

## 2022-01-04 NOTE — Telephone Encounter (Signed)
?  Encourage patient to contact the pharmacy for refills or they can request refills through [facility] ? ?Did the patient contact the pharmacy:  N ? ? ?LAST APPOINTMENT DATE:  5.3.23 ? ?NEXT APPOINTMENT DATE: 9.5.23 ? ?MEDICATION:  oxyCODONE-acetaminophen (PERCOCET) 7.5-325 MG tablet ? ?Is the patient out of medication? Y ? ?If not, how much is left? Pt only has one pill left ? ?Is this a 90 day supply: (pt is requesting a 90 day supply, states he is taking 2 per day and if he waits to talk to MD he will be out and into next week) ? ?PHARMACY:   ?[facility] [place], [place] Phone:  [phone]  ?Fax:  [phone]  ?  ? ? ?Let patient know to contact pharmacy at the end of the day to make sure medication is ready. ? ?Please notify patient to allow 48-72 hours to process  ?  ?

## 2022-01-05 NOTE — Telephone Encounter (Addendum)
Spoke with pt relaying Dr. Synthia Innocent message.  Pt verbalizes understanding and agrees to pain mgmt referral, prefers [facility].  Declines neurosurgery eval at this time.  States he's seen them before and they didn't help.  ?

## 2022-01-06 NOTE — Telephone Encounter (Signed)
Ambulatory referral to pain management at [facility] placed ?

## 2022-01-06 NOTE — Addendum Note (Signed)
Addended by: Ria Bush on: 01/06/2022 07:12 AM ? ? Modules accepted: Orders ? ?

## 2022-01-10 ENCOUNTER — Other Ambulatory Visit: Payer: Self-pay | Admitting: Pharmacist

## 2022-01-10 DIAGNOSIS — E782 Mixed hyperlipidemia: Secondary | ICD-10-CM

## 2022-01-10 MED ORDER — PRALUENT 75 MG/ML ~~LOC~~ SOAJ: 1.0000 "pen " | SUBCUTANEOUS | 11 refills | Status: DC

## 2022-01-11 ENCOUNTER — Ambulatory Visit (INDEPENDENT_AMBULATORY_CARE_PROVIDER_SITE_OTHER): Payer: Medicare Other | Admitting: Dermatology

## 2022-01-11 DIAGNOSIS — C4491 Basal cell carcinoma of skin, unspecified: Secondary | ICD-10-CM

## 2022-01-11 DIAGNOSIS — L578 Other skin changes due to chronic exposure to nonionizing radiation: Secondary | ICD-10-CM

## 2022-01-11 DIAGNOSIS — L820 Inflamed seborrheic keratosis: Secondary | ICD-10-CM

## 2022-01-11 DIAGNOSIS — L918 Other hypertrophic disorders of the skin: Secondary | ICD-10-CM | POA: Diagnosis not present

## 2022-01-11 DIAGNOSIS — D1800 Hemangioma unspecified site: Secondary | ICD-10-CM

## 2022-01-11 DIAGNOSIS — N481 Balanitis: Secondary | ICD-10-CM | POA: Diagnosis not present

## 2022-01-11 DIAGNOSIS — L720 Epidermal cyst: Secondary | ICD-10-CM | POA: Diagnosis not present

## 2022-01-11 DIAGNOSIS — L814 Other melanin hyperpigmentation: Secondary | ICD-10-CM | POA: Diagnosis not present

## 2022-01-11 DIAGNOSIS — L570 Actinic keratosis: Secondary | ICD-10-CM

## 2022-01-11 DIAGNOSIS — D229 Melanocytic nevi, unspecified: Secondary | ICD-10-CM | POA: Diagnosis not present

## 2022-01-11 DIAGNOSIS — C44319 Basal cell carcinoma of skin of other parts of face: Secondary | ICD-10-CM | POA: Diagnosis not present

## 2022-01-11 DIAGNOSIS — Z872 Personal history of diseases of the skin and subcutaneous tissue: Secondary | ICD-10-CM

## 2022-01-11 DIAGNOSIS — B353 Tinea pedis: Secondary | ICD-10-CM

## 2022-01-11 DIAGNOSIS — L821 Other seborrheic keratosis: Secondary | ICD-10-CM | POA: Diagnosis not present

## 2022-01-11 DIAGNOSIS — R21 Rash and other nonspecific skin eruption: Secondary | ICD-10-CM | POA: Diagnosis not present

## 2022-01-11 DIAGNOSIS — D485 Neoplasm of uncertain behavior of skin: Secondary | ICD-10-CM

## 2022-01-11 DIAGNOSIS — Z1283 Encounter for screening for malignant neoplasm of skin: Secondary | ICD-10-CM

## 2022-01-11 DIAGNOSIS — I2510 Atherosclerotic heart disease of native coronary artery without angina pectoris: Secondary | ICD-10-CM | POA: Diagnosis not present

## 2022-01-11 HISTORY — DX: Basal cell carcinoma of skin, unspecified: C44.91

## 2022-01-11 MED ORDER — MOMETASONE FUROATE 0.1 % EX CREA: 1.0000 "application " | TOPICAL_CREAM | Freq: Every day | CUTANEOUS | 0 refills | Status: DC | PRN

## 2022-01-11 NOTE — Progress Notes (Signed)
Follow-Up Visit   Subjective  Anthony [name] is a 86 y.o. male who presents for the following: Annual Exam (History of AK - The patient presents for Total-Body Skin Exam (TBSE) for skin cancer screening and mole check.  The patient has spots, moles and lesions to be evaluated, some may be new or changing and the patient has concerns that these could be cancer./).  The following portions of the chart were reviewed this encounter and updated as appropriate:   Tobacco  Allergies  Meds  Problems  Med Hx  Surg Hx  Fam Hx     Review of Systems:  No other skin or systemic complaints except as noted in HPI or Assessment and Plan.  Objective  Well appearing patient in no apparent distress; mood and affect are within normal limits.  A full examination was performed including scalp, head, eyes, ears, nose, lips, neck, chest, axillae, abdomen, back, buttocks, bilateral upper extremities, bilateral lower extremities, hands, feet, fingers, toes, fingernails, and toenails. All findings within normal limits unless otherwise noted below.  Scaling over distal and lateral soles.   Mid Back Spinal Subcutaneous nodule.   Face (16) Erythematous thin papules/macules with gritty scale.   Left cheek 1.0 cm crusted papule       Face (3) Erythematous stuck-on, waxy papule or plaque   Assessment & Plan   Acrochordons (Skin Tags) - Fleshy, skin-colored pedunculated papules - Benign appearing.  - Observe. - If desired, they can be removed with an in office procedure that is not covered by insurance. - Please call the clinic if you notice any new or changing lesions.  Lentigines - Scattered tan macules - Due to sun exposure - Benign-appearing, observe - Recommend daily broad spectrum sunscreen SPF 30+ to sun-exposed areas, reapply every 2 hours as needed. - Call for any changes  Seborrheic Keratoses - Stuck-on, waxy, tan-brown papules and/or plaques  - Benign-appearing - Discussed  benign etiology and prognosis. - Observe - Call for any changes  Melanocytic Nevi - Tan-brown and/or pink-flesh-colored symmetric macules and papules - Benign appearing on exam today - Observation - Call clinic for new or changing moles - Recommend daily use of broad spectrum spf 30+ sunscreen to sun-exposed areas.   Hemangiomas - Red papules - Discussed benign nature - Observe - Call for any changes  Actinic Damage - Chronic condition, secondary to cumulative UV/sun exposure - diffuse scaly erythematous macules with underlying dyspigmentation - Recommend daily broad spectrum sunscreen SPF 30+ to sun-exposed areas, reapply every 2 hours as needed.  - Staying in the shade or wearing long sleeves, sun glasses (UVA+UVB protection) and wide brim hats (4-inch brim around the entire circumference of the hat) are also recommended for sun protection.  - Call for new or changing lesions.  Skin cancer screening performed today.  Epidermal inclusion cyst Mid Back Spinal Benign-appearing. Exam most consistent with an epidermal inclusion cyst. Discussed that a cyst is a benign growth that can grow over time and sometimes get irritated or inflamed. Recommend observation if it is not bothersome. Discussed option of surgical excision to remove it if it is growing, symptomatic, or other changes noted. Please call for new or changing lesions so they can be evaluated. Benign. No treatment recommended at this time  AK (actinic keratosis) (16) Face Destruction of lesion - Face Complexity: simple   Destruction method: cryotherapy   Informed consent: discussed and consent obtained   Timeout:  patient name, date of birth, surgical site, and procedure  verified Lesion destroyed using liquid nitrogen: Yes   Region frozen until ice ball extended beyond lesion: Yes   Outcome: patient tolerated procedure well with no complications   Post-procedure details: wound care instructions given    Tinea pedis of  both feet Chronic and persistent condition with duration or expected duration over one year. Condition is symptomatic / bothersome to patient. Not to goal. Ketoconazole 2% cream qhs  Related Medications ciclopirox (PENLAC) 8 % solution Apply topically at bedtime. Apply over nail and surrounding skin. Apply daily over previous coat. After seven (7) days, may remove with alcohol and continue cycle.  ketoconazole (NIZORAL) 2 % cream APPLY TO FEET AND FACE AT BEDTIME AS DIRECTED  Neoplasm of uncertain behavior of skin Left cheek Skin / nail biopsy Type of biopsy: tangential   Informed consent: discussed and consent obtained   Timeout: patient name, date of birth, surgical site, and procedure verified   Procedure prep:  Patient was prepped and draped in usual sterile fashion Prep type:  Isopropyl alcohol Anesthesia: the lesion was anesthetized in a standard fashion   Anesthetic:  1% lidocaine w/ epinephrine 1-100,000 buffered w/ 8.4% NaHCO3 Instrument used: flexible razor blade   Hemostasis achieved with: pressure, aluminum chloride and electrodesiccation   Outcome: patient tolerated procedure well   Post-procedure details: sterile dressing applied and wound care instructions given   Dressing type: bandage and petrolatum    Specimen 1 - Surgical pathology Differential Diagnosis: BCC vs other  Check Margins: No  Inflamed seborrheic keratosis (3) Face Destruction of lesion - Face Complexity: simple   Destruction method: cryotherapy   Informed consent: discussed and consent obtained   Timeout:  patient name, date of birth, surgical site, and procedure verified Lesion destroyed using liquid nitrogen: Yes   Region frozen until ice ball extended beyond lesion: Yes   Outcome: patient tolerated procedure well with no complications   Post-procedure details: wound care instructions given    Rash -balanitis Penis May consider evaluation with urologist if condition persists  mometasone  (ELOCON) 0.1 % cream - Penis Apply 1 application. topically daily as needed (Rash).  Skin cancer screening  Return in about 6 months (around 07/14/2022).  I, Ashok Cordia, CMA, am acting as scribe for Sarina Ser, MD . Documentation: I have reviewed the above documentation for accuracy and completeness, and I agree with the above.  Sarina Ser, MD

## 2022-01-11 NOTE — Patient Instructions (Signed)
Cryotherapy Aftercare  Wash gently with soap and water everyday.   Apply Vaseline and Band-Aid daily until healed.    Wound Care Instructions  Cleanse wound gently with soap and water once a day then pat dry with clean gauze. Apply a thing coat of Petrolatum (petroleum jelly, "Vaseline") over the wound (unless you have an allergy to this). We recommend that you use a new, sterile tube of Vaseline. Do not pick or remove scabs. Do not remove the yellow or white "healing tissue" from the base of the wound.  Cover the wound with fresh, clean, nonstick gauze and secure with paper tape. You may use Band-Aids in place of gauze and tape if the would is small enough, but would recommend trimming much of the tape off as there is often too much. Sometimes Band-Aids can irritate the skin.  You should call the office for your biopsy report after 1 week if you have not already been contacted.  If you experience any problems, such as abnormal amounts of bleeding, swelling, significant bruising, significant pain, or evidence of infection, please call the office immediately.  FOR ADULT SURGERY PATIENTS: If you need something for pain relief you may take 1 extra strength Tylenol (acetaminophen) AND 2 Ibuprofen (200mg each) together every 4 hours as needed for pain. (do not take these if you are allergic to them or if you have a reason you should not take them.) Typically, you may only need pain medication for 1 to 3 days.        If You Need Anything After Your Visit  If you have any questions or concerns for your doctor, please call our main line at [phone] and press option 4 to reach your doctor's medical assistant. If no one answers, please leave a voicemail as directed and we will return your call as soon as possible. Messages left after 4 pm will be answered the following business day.   You may also send us a message via MyChart. We typically respond to MyChart messages within 1-2 business  days.  For prescription refills, please ask your pharmacy to contact our office. Our fax number is [phone].  If you have an urgent issue when the clinic is closed that cannot wait until the next business day, you can page your doctor at the number below.    Please note that while we do our best to be available for urgent issues outside of office hours, we are not available 24/7.   If you have an urgent issue and are unable to reach us, you may choose to seek medical care at your doctor's office, retail clinic, urgent care center, or emergency room.  If you have a medical emergency, please immediately call 911 or go to the emergency department.  Pager Numbers  - Dr. Kowalski: [phone]  - Dr. Moye: [phone]  - Dr. Stewart: [phone]  In the event of inclement weather, please call our main line at [phone] for an update on the status of any delays or closures.  Dermatology Medication Tips: Please keep the boxes that topical medications come in in order to help keep track of the instructions about where and how to use these. Pharmacies typically print the medication instructions only on the boxes and not directly on the medication tubes.   If your medication is too expensive, please contact our office at [phone] option 4 or send us a message through MyChart.   We are unable to tell what your co-pay for medications will be   in advance as this is different depending on your insurance coverage. However, we may be able to find a substitute medication at lower cost or fill out paperwork to get insurance to cover a needed medication.   If a prior authorization is required to get your medication covered by your insurance company, please allow us 1-2 business days to complete this process.  Drug prices often vary depending on where the prescription is filled and some pharmacies may offer cheaper prices.  The website www.goodrx.com contains coupons for medications through  different pharmacies. The prices here do not account for what the cost may be with help from insurance (it may be cheaper with your insurance), but the website can give you the price if you did not use any insurance.  - You can print the associated coupon and take it with your prescription to the pharmacy.  - You may also stop by our office during regular business hours and pick up a GoodRx coupon card.  - If you need your prescription sent electronically to a different pharmacy, notify our office through [facility] MyChart or by phone at [phone] option 4.     Si Usted Necesita Algo Despus de Su Visita  Tambin puede enviarnos un mensaje a travs de MyChart. Por lo general respondemos a los mensajes de MyChart en el transcurso de 1 a 2 das hbiles.  Para renovar recetas, por favor pida a su farmacia que se ponga en contacto con nuestra oficina. Nuestro nmero de fax es el [phone].  Si tiene un asunto urgente cuando la clnica est cerrada y que no puede esperar hasta el siguiente da hbil, puede llamar/localizar a su doctor(a) al nmero que aparece a continuacin.   Por favor, tenga en cuenta que aunque hacemos todo lo posible para estar disponibles para asuntos urgentes fuera del horario de oficina, no estamos disponibles las 24 horas del da, los 7 das de la semana.   Si tiene un problema urgente y no puede comunicarse con nosotros, puede optar por buscar atencin mdica  en el consultorio de su doctor(a), en una clnica privada, en un centro de atencin urgente o en una sala de emergencias.  Si tiene una emergencia mdica, por favor llame inmediatamente al 911 o vaya a la sala de emergencias.  Nmeros de bper  - Dr. Kowalski: [phone]  - Dra. Moye: [phone]  - Dra. Stewart: [phone]  En caso de inclemencias del tiempo, por favor llame a nuestra lnea principal al [phone] para una actualizacin sobre el estado de cualquier retraso o cierre.  Consejos  para la medicacin en dermatologa: Por favor, guarde las cajas en las que vienen los medicamentos de uso tpico para ayudarle a seguir las instrucciones sobre dnde y cmo usarlos. Las farmacias generalmente imprimen las instrucciones del medicamento slo en las cajas y no directamente en los tubos del medicamento.   Si su medicamento es muy caro, por favor, pngase en contacto con nuestra oficina llamando al [phone] y presione la opcin 4 o envenos un mensaje a travs de MyChart.   No podemos decirle cul ser su copago por los medicamentos por adelantado ya que esto es diferente dependiendo de la cobertura de su seguro. Sin embargo, es posible que podamos encontrar un medicamento sustituto a menor costo o llenar un formulario para que el seguro cubra el medicamento que se considera necesario.   Si se requiere una autorizacin previa para que su compaa de seguros cubra su medicamento, por favor permtanos de 1 a   2 das hbiles para completar este proceso.  Los precios de los medicamentos varan con frecuencia dependiendo del lugar de dnde se surte la receta y alguna farmacias pueden ofrecer precios ms baratos.  El sitio web www.goodrx.com tiene cupones para medicamentos de diferentes farmacias. Los precios aqu no tienen en cuenta lo que podra costar con la ayuda del seguro (puede ser ms barato con su seguro), pero el sitio web puede darle el precio si no utiliz ningn seguro.  - Puede imprimir el cupn correspondiente y llevarlo con su receta a la farmacia.  - Tambin puede pasar por nuestra oficina durante el horario de atencin regular y recoger una tarjeta de cupones de GoodRx.  - Si necesita que su receta se enve electrnicamente a una farmacia diferente, informe a nuestra oficina a travs de MyChart de [place] o por telfono llamando al [phone] y presione la opcin 4.  

## 2022-01-12 ENCOUNTER — Other Ambulatory Visit: Payer: Self-pay

## 2022-01-12 NOTE — Telephone Encounter (Signed)
Refill request received for Trazodone. Did not see where you have prescribed in past.

## 2022-01-13 NOTE — Telephone Encounter (Signed)
Refill request Trazodone Last refill looks like may have been filled by oncology Pharmacy note shows last refilled 12/12/21 #30 Last office visit 12/28/21

## 2022-01-14 MED ORDER — TRAZODONE HCL 50 MG PO TABS: 50.0000 mg | ORAL_TABLET | Freq: Every evening | ORAL | 3 refills | Status: DC | PRN

## 2022-01-14 NOTE — Telephone Encounter (Signed)
ERx 

## 2022-01-17 ENCOUNTER — Telehealth: Payer: Self-pay

## 2022-01-17 DIAGNOSIS — M25571 Pain in right ankle and joints of right foot: Secondary | ICD-10-CM | POA: Insufficient documentation

## 2022-01-17 DIAGNOSIS — M19071 Primary osteoarthritis, right ankle and foot: Secondary | ICD-10-CM | POA: Diagnosis not present

## 2022-01-17 DIAGNOSIS — M5416 Radiculopathy, lumbar region: Secondary | ICD-10-CM | POA: Diagnosis not present

## 2022-01-17 DIAGNOSIS — M19079 Primary osteoarthritis, unspecified ankle and foot: Secondary | ICD-10-CM | POA: Insufficient documentation

## 2022-01-17 NOTE — Telephone Encounter (Signed)
Left voicemail to return my call

## 2022-01-17 NOTE — Telephone Encounter (Signed)
-----   Message from Ralene Bathe, MD sent at 01/13/2022  6:33 PM EDT ----- Diagnosis Skin , left cheek BASAL CELL CARCINOMA, NODULAR AND INFILTRATIVE PATTERNS, BASE INVOLVED  Cancer - BCC Schedule surgery

## 2022-01-21 ENCOUNTER — Encounter: Payer: Self-pay | Admitting: Dermatology

## 2022-01-31 ENCOUNTER — Ambulatory Visit
Payer: Medicare Other | Attending: Student in an Organized Health Care Education/Training Program | Admitting: Student in an Organized Health Care Education/Training Program

## 2022-01-31 ENCOUNTER — Encounter: Payer: Self-pay | Admitting: Student in an Organized Health Care Education/Training Program

## 2022-01-31 ENCOUNTER — Telehealth: Payer: Self-pay

## 2022-01-31 VITALS — BP 144/91 | HR 98 | Temp 98.2°F | Ht 72.0 in | Wt 270.0 lb

## 2022-01-31 DIAGNOSIS — M5136 Other intervertebral disc degeneration, lumbar region: Secondary | ICD-10-CM | POA: Insufficient documentation

## 2022-01-31 DIAGNOSIS — G894 Chronic pain syndrome: Secondary | ICD-10-CM | POA: Insufficient documentation

## 2022-01-31 DIAGNOSIS — M47816 Spondylosis without myelopathy or radiculopathy, lumbar region: Secondary | ICD-10-CM | POA: Diagnosis not present

## 2022-01-31 NOTE — Telephone Encounter (Signed)
Dr Holley Raring would like to do a Radiofrequency ablation lumbar on Anthony [name] and would like permission to stop his Plavix for 7 days.  Would this be acceptable?  Thank you

## 2022-01-31 NOTE — Progress Notes (Signed)
Safety precautions to be maintained throughout the outpatient stay will include: orient to surroundings, keep bed in low position, maintain call bell within reach at all times, provide assistance with transfer out of bed and ambulation.  

## 2022-01-31 NOTE — Patient Instructions (Signed)
Stop Plavix for 7 days and bring a driver.

## 2022-01-31 NOTE — Progress Notes (Signed)
[name]  Service Category: E/M  Provider: Gillis Santa, MD  DOB: [birth year]  DOS: 01/31/2022  Referring Provider: Ria Bush, MD  MRN: [id number]  Setting: Ambulatory outpatient  PCP: Ria Bush, MD  Type: New Patient  Specialty: Interventional Pain Management    Location: Office  Delivery: Face-to-face     Primary Reason(s) for Visit: Encounter for initial evaluation of one or more chronic problems (new to examiner) potentially causing chronic pain, and posing a threat to normal musculoskeletal function. (Level of risk: High) CC: Back Pain (lower)  HPI  Anthony [name] is a 86 y.o. year old, male patient, who comes for the first time to our practice referred by Ria Bush, MD for our initial evaluation of his chronic pain. He has History of basal cell carcinoma (BCC); History of squamous cell carcinoma in situ of skin; Hypercholesterolemia with hypertriglyceridemia; Chronic constipation; OSA on CPAP; BPH (benign prostatic hyperplasia); History of atrial flutter; Chronic insomnia; Diastolic heart failure ([place]); Essential hypertension; Hypothyroidism; Severe obesity (BMI 35.0-39.9) with comorbidity ([place]); CAD (coronary artery disease); Chronic lower back pain; Nasal congestion; Therapeutic opioid-induced constipation (OIC); CKD (chronic kidney disease) stage 3, GFR 30-59 ml/min (HCC); S/P insertion of spinal cord stimulator; Vitamin D deficiency; Advanced care planning/counseling discussion; Anosmia; Abdominal aortic aneurysm (AAA) without rupture ([place]); CAD in native artery; Calculus of gallbladder without cholecystitis; Cystic kidney disease; Diverticulitis of sigmoid colon; Esophageal reflux; Osteoarthritis; Left leg pain; Recurrent major depression in remission ([place]); Seasonal allergies; Spinal stenosis; Spondylolisthesis of lumbar region; Essential tremor; Encounter for chronic pain management; General unsteadiness; Varicose veins of leg with pain, bilateral; Eosinophilia;  S/P endovascular aneurysm repair; Fatigue; Medicare annual wellness visit, subsequent; Prediabetes; Lumbar facet arthropathy; Lumbar degenerative disc disease; and Chronic pain syndrome on their problem list. Today he comes in for evaluation of his Back Pain (lower)  Pain Assessment: Location: Left Back Radiating: pain radiaities down left  leg Onset: More than a month ago Duration: Chronic pain Quality: Aching, Tingling, Constant Severity: 9 /10 (subjective, self-reported pain score)  Effect on ADL: limits my daily activities Timing: Constant Modifying factors: meds, standing up BP: (!) 144/91  HR: 98  Onset and Duration: Gradual Cause of pain: Unknown Severity: No change since onset, NAS-11 at its worse: 9/10, NAS-11 at its best: 2/10, NAS-11 now: 9/10, and NAS-11 on the average: 9/10 Timing: Not influenced by the time of the day Aggravating Factors:  nothing Alleviating Factors: Walking Associated Problems: Tingling Quality of Pain: Aching and Tingling Previous Examinations or Tests: CT scan, Nerve block, X-rays, Nerve conduction test, Neurological evaluation, Neurosurgical evaluation, and Orthopedic evaluation Previous Treatments: Chiropractic manipulations, Epidural steroid injections, Facet blocks, Narcotic medications, Physical Therapy, Pool exercises, Radiofrequency, Relaxation therapy, Spinal cord stimulator, Steroid treatments by mouth, Strengthening exercises, Stretching exercises, and TENS  Anthony [name] is a pleasant 86 year old male who previously saw me in 2019 for his low back and left hip and leg pain related to lumbar degenerative disc disease, lumbar spondylosis, lumbar facet arthropathy.  Of note patient had a left L3, L4, L5 medial branch nerve block, a series of 2 which were positive for pain relief and improvement in his functional status that we subsequently performed a left L3, L4, L5 RFA 09/19/2017 that did provide him with pain relief of his low back and radiating left leg  pain.  Patient follows up today with similar pain presentation and is wondering what his treatment options are and if they include repeating lumbar radiofrequency ablation.  He admits that he has  been receiving oral steroid therapy fairly chronically and that his primary care provider wants to limit further oral steroids.  He is on Plavix for his coronary artery and peripheral vascular disease.  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  Meds   Current Outpatient Medications:    acetaminophen (TYLENOL) 500 MG tablet, Take 1 tablet (500 mg total) by mouth 2 (two) times daily as needed for moderate pain., Disp: , Rfl:    Alirocumab (PRALUENT) 75 MG/ML SOAJ, Inject 1 pen. into the skin every 14 (fourteen) days., Disp: 2 mL, Rfl: 11   amLODipine (NORVASC) 5 MG tablet, TAKE 1 TABLET(5 MG) BY MOUTH DAILY, Disp: 90 tablet, Rfl: 1   aspirin EC 81 MG tablet, Take 1 tablet (81 mg total) by mouth daily., Disp: , Rfl:    Cholecalciferol (VITAMIN D3) 1000 units CAPS, Take 1 capsule (1,000 Units total) by mouth daily., Disp: 30 capsule, Rfl:    ciclopirox (PENLAC) 8 % solution, Apply topically at bedtime. Apply over nail and surrounding skin. Apply daily over previous coat. After seven (7) days, may remove with alcohol and continue cycle. (Patient taking differently: Apply 1 application. topically at bedtime as needed. Apply over nail and surrounding skin. Apply daily over previous coat. After seven (7) days, may remove with alcohol and continue cycle.), Disp: 6.6 mL, Rfl: 0   clopidogrel (PLAVIX) 75 MG tablet, TAKE 1 TABLET(75 MG) BY MOUTH DAILY AT 6 AM, Disp: 90 tablet, Rfl: 3   diphenhydramine-acetaminophen (TYLENOL PM) 25-500 MG TABS tablet, Take 2 tablets by mouth at bedtime., Disp: , Rfl:    docusate sodium (COLACE) 100 MG capsule, Take 100 mg by mouth daily., Disp: , Rfl:    Eszopiclone 3 MG TABS, Take 1 tablet (3 mg total) by mouth at bedtime as needed. Use sparingly, take  immediately before bedtime, Disp: 30 tablet, Rfl: 0   ezetimibe (ZETIA) 10 MG tablet, Take 1 tablet (10 mg total) by mouth daily., Disp: 90 tablet, Rfl: 3   finasteride (PROSCAR) 5 MG tablet, TAKE 1 TABLET(5 MG) BY MOUTH DAILY, Disp: 90 tablet, Rfl: 0   fluocinonide (LIDEX) 0.05 % external solution, Apply 1 application topically 2 (two) times daily as needed. (Patient taking differently: Apply 1 application. topically daily as needed (irritation).), Disp: 60 mL, Rfl: 2   furosemide (LASIX) 40 MG tablet, TAKE 1 TABLET BY MOUTH EVERY DAY. TAKE AN EXTRA TABLET AS NEEDED FOR WEIGHT GAIN OF 3 LBS/DAY OR 5LBS/WEEK, Disp: 135 tablet, Rfl: 3   gabapentin (NEURONTIN) 300 MG capsule, TAKE 2 CAPSULE BY MOUTH AT BEDTIME WITH EXTRA CAPSULE DURING THE DAY AS NEEDED, Disp: 200 capsule, Rfl: 1   icosapent Ethyl (VASCEPA) 1 g capsule, Take 2 capsules (2 g total) by mouth 2 (two) times daily., Disp: 360 capsule, Rfl: 3   ketoconazole (NIZORAL) 2 % cream, APPLY TO FEET AND FACE AT BEDTIME AS DIRECTED, Disp: 60 g, Rfl: 3   ketoconazole (NIZORAL) 2 % shampoo, APPLY TOPICALLY 3 TIMES WEEKLY AS DIRECTED, Disp: 120 mL, Rfl: 2   levothyroxine (SYNTHROID) 150 MCG tablet, Take 1 tablet (150 mcg total) by mouth daily before breakfast., Disp: 90 tablet, Rfl: 0   linaclotide (LINZESS) 290 MCG CAPS capsule, Take 1 capsule (290 mcg total) by mouth daily before breakfast., Disp: 90 capsule, Rfl: 1   lisinopril (ZESTRIL) 10 MG tablet, TAKE 1 TABLET(10 MG) BY MOUTH DAILY, Disp: 90 tablet, Rfl: 3   Melatonin 10 MG CAPS, Take 10  mg by mouth at bedtime., Disp: , Rfl:    meloxicam (MOBIC) 15 MG tablet, Take 1 tablet (15 mg total) by mouth daily., Disp: , Rfl:    mometasone (ELOCON) 0.1 % cream, Apply 1 application. topically daily as needed ([place])., Disp: 15 g, Rfl: 0   mometasone (ELOCON) 0.1 % lotion, Apply to scalp QHS on Monday, Wednesday, and Friday. (Patient taking differently: Apply 1 application. topically every Monday, Wednesday,  and Friday. Apply to scalp QHS on Monday, Wednesday, and Friday.), Disp: 60 mL, Rfl: 3   Multiple Vitamins-Minerals (CENTRUM SILVER PO), Take 1 tablet by mouth daily., Disp: , Rfl:    Multiple Vitamins-Minerals (PRESERVISION AREDS 2 PO), Take 1 tablet by mouth daily., Disp: , Rfl:    nitroGLYCERIN (NITROSTAT) 0.4 MG SL tablet, Place 1 tablet (0.4 mg total) under the tongue every 5 (five) minutes as needed for chest pain., Disp: 25 tablet, Rfl: 0   ondansetron (ZOFRAN) 4 MG tablet, Take 1 tablet (4 mg total) by mouth every 6 (six) hours as needed for nausea., Disp: 20 tablet, Rfl: 0   oxyCODONE-acetaminophen (PERCOCET) 7.5-325 MG tablet, Take 1 tablet by mouth 3 (three) times daily as needed for severe pain., Disp: 30 tablet, Rfl: 0   senna (SENOKOT) 8.6 MG tablet, Take 1 tablet (8.6 mg total) by mouth daily., Disp: 30 tablet, Rfl: 3   tamsulosin (FLOMAX) 0.4 MG CAPS capsule, Take 1 capsule (0.4 mg total) by mouth daily., Disp: 90 capsule, Rfl: 3   tiZANidine (ZANAFLEX) 2 MG tablet, TAKE 1 TO 2 TABLETS(2 TO 4 MG) BY MOUTH TWICE DAILY AS NEEDED FOR MUSCLE SPASMS, Disp: 30 tablet, Rfl: 0   traZODone (DESYREL) 50 MG tablet, Take 1 tablet (50 mg total) by mouth at bedtime as needed for sleep., Disp: 30 tablet, Rfl: 3  Imaging Review   CT THORACIC SPINE W CONTRAST  Narrative CLINICAL DATA:  Failure of spinal cord stimulator. Back pain.  EXAM: CT THORACIC SPINE WITH CONTRAST  TECHNIQUE: Multidetector CT images of thoracic was performed according to the standard protocol following intravenous contrast administration.  CONTRAST:  Intrathecal contrast placed at myelography as described on that report.  COMPARISON:  Myelogram images same day.  FINDINGS: Alignment: Mild curvature convex to the right with the apex at T7. No antero or retrolisthesis in the thoracic region.  Vertebrae: No fracture or primary bone lesion. Benign cyst noted within C6. Degenerative endplate changes at G64-40 and  T11-12.  Paraspinal and other soft tissues: Negative  Disc levels: T1-2 through T6-7: Normal. No significant disc degeneration. No stenosis of the canal or foramina. Ordinary mild facet osteoarthritis on the right at T4-5 and T5-6.  T7-8: Neurostimulator present in the dorsal epidural space. Minimal disc bulge. No canal or foraminal stenosis.  T8-9: Neurostimulator present in the dorsal epidural space. No disc pathology. No stenosis.  T9-10: Neurostimulator present in the dorsal epidural space. No disc pathology. No stenosis.  T10-11: Disc degeneration with endplate osteophytes and bulging of the disc. Bilateral facet arthropathy. Mild multifactorial stenosis with narrowing of the subarachnoid space surrounding the cord but no cord compression. Bilateral foraminal stenosis could affect either T10 nerve.  T11-12: Endplate osteophytes and circumferential protrusion of the disc. Bilateral facet arthropathy. Narrowing the spinal canal with effacement of the subarachnoid space surrounding the cord but no cord compression. Bilateral foraminal stenosis could affect either T11 nerve.  T12-L1: Minimal disc bulge. Mild facet degeneration. No compressive stenosis.  IMPRESSION: 1. Mild curvature convex to the right with the  apex at T7. 2. T10-11: Mild multifactorial stenosis with narrowing of the subarachnoid space surrounding the cord but no cord compression. Bilateral foraminal stenosis that could affect either T10 nerve. 3. T11-12: Endplate osteophytes and circumferential protrusion of the disc. Bilateral facet arthropathy. Narrowing of the spinal canal with effacement of the subarachnoid space surrounding the cord but no cord compression. Bilateral foraminal stenosis that could affect either T11 nerve. 4. Neurostimulator in the dorsal epidural space from inferior T7 to T9.   Electronically Signed By: Nelson Chimes M.D. On: 08/19/2018 10:33    Narrative CLINICAL DATA:   Failure of spinal cord stimulator, subsequent encounter  FLUOROSCOPY TIME:  7 minutes 30 seconds.  PROCEDURE: LUMBAR PUNCTURE FOR THORACIC MYELOGRAM  After thorough discussion of risks and benefits of the procedure including bleeding, infection, injury to nerves, blood vessels, adjacent structures as well as headache and CSF leak, written and oral informed consent was obtained. Consent was obtained by Dr. Marcello Moores Register.  Patient was positioned prone on the fluoroscopy table. Local anesthesia was provided with 1% lidocaine without epinephrine after prepped and draped in the usual sterile fashion. Puncture was performed at LEVEL using a 3 1/2 inch 22-gauge spinal needle via (_ left ___Paramedian) approach. Using a single pass through the dura, the needle was placed within the thecal sac, with return of clear CSF. 10 mL of Isovue M-300 was injected into the thecal sac, with normal opacification of the nerve roots and cauda equina consistent with free flow within the subarachnoid space. The patient was then moved to the trendelenburg position and contrast flowed into the Thoracic spine region.  I personally performed the lumbar puncture and administered the intrathecal contrast. I also personally supervised acquisition of the myelogram images.  TECHNIQUE: Contiguous axial images were obtained through the Thoracic spine after the intrathecal infusion of infusion. Coronal and sagittal reconstructions were obtained of the axial image sets.  FINDINGS: THORACIC MYELOGRAM FINDINGS:  Multi level upper lumbar lower thoracic ventral epidural defects consistent degenerative change. Mild degenerative changes are present about the upper thoracic spine. Neurostimulator leads appear to be intact. Lead tips are noted just posterior to the canal on the myelogram. Reference is made to CT report for full discussion of findings  CT THORACIC MYELOGRAM FINDINGS:  Reference is made to CT  report for full discussion of findings.  IMPRESSION: Degenerative changes neurostimulator as above. Reference is made to CT report for full discussion of findings.   Electronically Signed By: Kleberg On: 08/19/2018 10:14  Narrative CLINICAL DATA:  Chronic back pain.  EXAM: CT LUMBAR SPINE WITH CONTRAST  TECHNIQUE: Multidetector CT imaging of the lumbar spine was performed with intravenous contrast administration.  CONTRAST:  Intrathecal contrast administered at myelography.  COMPARISON:  Myelogram images same day.  FINDINGS: Segmentation: 5 lumbar type vertebral bodies.  Alignment: Thoracolumbar curvature convex to the right. Lower lumbar curvature convex to the left. 3 mm retrolisthesis L2-3 and L3-4. 4 mm retrolisthesis L4-5. 5 mm anterolisthesis L5-S1 due to chronic pars defects.  Vertebrae: Chronic degenerative endplate changes W9-6 through L4-5. No fracture or primary bone lesion. Chronic pars defects at L5 as noted above. Chronic pedicle fracture on the left at L5.  Paraspinal and other soft tissues: Infrarenal abdominal aortic aneurysm, not completely imaged. Bilobed configuration with maximal diameter of at least 4.5 cm. Recommend followup by abdomen and pelvis CTA in 6 months, and vascular surgery referral/consultation if not already obtained. This recommendation follows ACR consensus guidelines: White Paper of the ACR  Incidental Findings Committee II on Vascular Findings. J Am Coll Radiol 2013; 10:789-794.  Disc levels: L1-2: Disc degeneration with vacuum phenomenon. Circumferential protrusion more prominent towards the left. Facet and ligamentous hypertrophy worse on the left. Multifactorial canal stenosis that could cause neural compression. Foraminal stenosis left worse than right that could cause neural compression, particularly of the left L1 nerve.  L2-3: 3 mm retrolisthesis. Chronic disc degeneration with loss of height. Endplate  osteophytes and bulging of the disc. Small amount of herniated disc material to the right of midline showing caudal migration. Stenosis of the lateral recesses and foramina right more than left. Neural compression could occur at this level, particularly on the right.  L3-4: 3 mm retrolisthesis. Endplate osteophytes and minimal bulging disc material. Facet and ligamentous hypertrophy. Multifactorial stenosis of the canal and foramina that could cause neural compression on either or both sides.  L4-5: Retrolisthesis of 4 mm. Chronic disc degeneration with endplate osteophytes and bulging of the disc. Pronounced facet and ligamentous hypertrophy. Severe stenosis of the central canal, lateral recesses and neural foramina that could cause neural compression on either or both sides.  L5-S1: Chronic bilateral pars defects allowing anterolisthesis of 5 mm. Chronic pedicle fracture on the left. Disc degeneration and vacuum phenomenon. Circumferential bulging of the disc. Very severe spinal stenosis and neural foraminal stenosis at this level that could cause neural compression on either or both sides.  IMPRESSION: Bilobed infrarenal abdominal aortic aneurysm, not primarily evaluated or completely evaluated. Maximal transverse diameter at least 4.5 cm. Recommend followup by abdomen and pelvis CTA in 6 months, and vascular surgery referral/consultation if not already obtained. This recommendation follows ACR consensus guidelines: White Paper of the ACR Incidental Findings Committee II on Vascular Findings. J Am Coll Radiol 2013; 10:789-794.  Advanced degenerative disease throughout the lumbar region as outlined above. Multilevel severe spinal stenosis and neural foraminal stenosis with considerable potential for neural compression on either or both sides. Findings include chronic bilateral pars defects at L5 as well as a chronic pedicle fracture on the left at L5.   Electronically  Signed By: Nelson Chimes M.D. On: 08/19/2018 10:41  Narrative CLINICAL DATA:  Lumbar surgery.  EXAM: LUMBAR SPINE - 2-3 VIEW  COMPARISON:  No recent prior.  FINDINGS: Neurostimulator noted with lead tips over the thoracic spine. No acute bony abnormality. Degenerative change thoracic spine.  IMPRESSION: Neurostimulator noted with lead tips over the thoracic spine.   Electronically Signed By: Marcello Moores  Register On: 09/09/2018 10:00  Lumbar DG (Complete) 4+V: Results for orders placed during the hospital encounter of 04/02/20  DG Lumbar Spine Complete  Narrative CLINICAL DATA:  Left leg weakness. Sciatic pain.  EXAM: LUMBAR SPINE - COMPLETE 4+ VIEW  COMPARISON:  CT myelogram 08/19/2018.  FINDINGS: Technically limited exam due to soft tissue attenuation from habitus. Trace anterolisthesis of L5 on S1. The L5 pars defects on prior CT are not appreciated by radiograph. Diffuse disc space narrowing and endplate spurring with vacuum phenomenon at multiple levels. Diffuse multilevel facet hypertrophy. No evidence of compression fracture. Spinal stimulator with tips not included in the field of view. The sacroiliac joints are congruent. Rounded 2.3 cm calcification in the right upper quadrant may represent gallstone. There is aortic atherosclerosis. The aortic calcifications are aneurysmal, also seen on prior CT, only partially included.  IMPRESSION: 1. Diffuse degenerative disc disease and facet hypertrophy. 2. Trace anterolisthesis of L5 on S1. The L5 pars defects on prior CT are not appreciated by radiograph. 3. Rounded  2.3 cm calcification in the right upper quadrant may represent a gallstone. 4. Aortic atherosclerosis. Calcified aortic aneurysm measuring at least 6.2 cm, not well assessed by radiograph. This measures at 4.5 cm on 2019 CT. Recommend dedicated aortic imaging as well as referral to a vascular specialist. This recommendation follows ACR consensus  guidelines: White Paper of the ACR Incidental Findings Committee II on Vascular Findings. J Am Coll Radiol 2013; 10:789-794.  These results will be called to the ordering clinician or representative by the Radiologist Assistant, and communication documented in the PACS or [employer].   Electronically Signed By: Keith Rake M.D. On: 04/02/2020 11:04  Narrative CLINICAL DATA:  Failed spinal cord stimulator.  EXAM: LUMBAR MYELOGRAM  FLUOROSCOPY TIME:  7 minutes 30 seconds.  PROCEDURE: After thorough discussion of risks and benefits of the procedure including bleeding, infection, injury to nerves, blood vessels, adjacent structures as well as headache and CSF leak, written and oral informed consent was obtained. Consent was obtained by Dr. Marcello Moores Register. Time out form was completed.  Patient was positioned prone on the fluoroscopy table. Local anesthesia was provided with 1% lidocaine without epinephrine after prepped and draped in the usual sterile fashion. Puncture was performed at L3 using a 3 1/2 inch 22-gauge spinal needle via (__ left __Paramedian) approach. Using a single pass through the dura, the needle was placed within the thecal sac, with return of clear CSF. 10 mL of Isovue 300 was injected into the thecal sac, with normal opacification of the nerve roots and cauda equina consistent with free flow within the subarachnoid space.  I personally performed the lumbar puncture and administered the intrathecal contrast. I also personally supervised acquisition of the myelogram images.  TECHNIQUE: Contiguous axial images were obtained through the Lumbar spine after the intrathecal infusion of infusion. Coronal and sagittal reconstructions were obtained of the axial image sets.  COMPARISON:  10/18/2017.  FINDINGS: LUMBAR MYELOGRAM FINDINGS:  Severe multilevel degenerative change with multilevel spinal stenosis. Spinal stimulator noted. Reference is  made to CT myelogram findings for complete description  CT LUMBAR MYELOGRAM FINDINGS:  Reference is made to CT report for complete description.  IMPRESSION: LUMBAR MYELOGRAM IMPRESSION:  Severe multilevel degenerative change with multilevel spinal stenosis.  CT LUMBAR MYELOGRAM IMPRESSION:  Reference is made to CT report for complete description of findings.   Electronically Signed By: Marcello Moores  Register On: 08/19/2018 10:05   Complexity Note: Imaging results reviewed. Results shared with Mr. Murnane, using Layman's terms.                         ROS  Cardiovascular: Heart trouble and Blood thinners:  Antiplatelet Pulmonary or Respiratory: No reported pulmonary signs or symptoms such as wheezing and difficulty taking a deep full breath (Asthma), difficulty blowing air out (Emphysema), coughing up mucus (Bronchitis), persistent dry cough, or temporary stoppage of breathing during sleep Neurological: No reported neurological signs or symptoms such as seizures, abnormal skin sensations, urinary and/or fecal incontinence, being born with an abnormal open spine and/or a tethered spinal cord Psychological-Psychiatric: No reported psychological or psychiatric signs or symptoms such as difficulty sleeping, anxiety, depression, delusions or hallucinations (schizophrenial), mood swings (bipolar disorders) or suicidal ideations or attempts Gastrointestinal: No reported gastrointestinal signs or symptoms such as vomiting or evacuating blood, reflux, heartburn, alternating episodes of diarrhea and constipation, inflamed or scarred liver, or pancreas or irrregular and/or infrequent bowel movements Genitourinary: No reported renal or genitourinary signs or symptoms such as  difficulty voiding or producing urine, peeing blood, non-functioning kidney, kidney stones, difficulty emptying the bladder, difficulty controlling the flow of urine, or chronic kidney disease Hematological: No reported  hematological signs or symptoms such as prolonged bleeding, low or poor functioning platelets, bruising or bleeding easily, hereditary bleeding problems, low energy levels due to low hemoglobin or being anemic Endocrine: No reported endocrine signs or symptoms such as high or low blood sugar, rapid heart rate due to high thyroid levels, obesity or weight gain due to slow thyroid or thyroid disease Rheumatologic: No reported rheumatological signs and symptoms such as fatigue, joint pain, tenderness, swelling, redness, heat, stiffness, decreased range of motion, with or without associated rash Musculoskeletal: Negative for myasthenia gravis, muscular dystrophy, multiple sclerosis or malignant hyperthermia Work History: Retired  Allergies  Anthony [name] is allergic to [place] [cholestyramine], atorvastatin, gemfibrozil, metformin and related, and rosuvastatin.  Laboratory Chemistry Profile   Renal Lab Results  Component Value Date   BUN 25 (H) 12/21/2021   CREATININE 1.31 12/21/2021   BCR 16 05/03/2017   GFR 50.09 (L) 12/21/2021   GFRAA 45 (L) 05/06/2020   GFRNONAA >60 10/31/2021   SPECGRAV >=1.030 (A) 12/22/2020   PHUR 5.5 12/22/2020   PROTEINUR Negative 12/22/2020     Electrolytes Lab Results  Component Value Date   NA 139 12/21/2021   K 4.2 12/21/2021   CL 102 12/21/2021   CALCIUM 10.3 12/21/2021   PHOS 2.9 09/28/2020     Hepatic Lab Results  Component Value Date   AST 15 12/21/2021   ALT 13 12/21/2021   ALBUMIN 4.0 12/21/2021   ALKPHOS 67 12/21/2021   LIPASE 42 10/30/2021     ID Lab Results  Component Value Date   SARSCOV2NAA NEGATIVE 10/30/2021   STAPHAUREUS NEGATIVE 03/08/2018   MRSAPCR NEGATIVE 03/08/2018     Bone Lab Results  Component Value Date   VD25OH 30.62 12/21/2021   VD125OH2TOT 51 10/28/2018   [id number] 51 10/28/2018   IR5188CZ6 <8 10/28/2018     Endocrine Lab Results  Component Value Date   GLUCOSE 121 (H) 12/21/2021   GLUCOSEU NEGATIVE  08/29/2018   HGBA1C 6.2 (A) 12/28/2021   TSH 4.81 12/21/2021   FREET4 0.74 12/21/2021     Neuropathy Lab Results  Component Value Date   HGBA1C 6.2 (A) 12/28/2021     CNS No results found for: COLORCSF, APPEARCSF, RBCCOUNTCSF, WBCCSF, POLYSCSF, LYMPHSCSF, EOSCSF, PROTEINCSF, GLUCCSF, JCVIRUS, CSFOLI, IGGCSF, LABACHR, ACETBL, LABACHR, ACETBL   Inflammation (CRP: Acute  ESR: Chronic) Lab Results  Component Value Date   LATICACIDVEN 1.5 10/30/2021     Rheumatology No results found for: RF, ANA, LABURIC, URICUR, LYMEIGGIGMAB, LYMEABIGMQN, HLAB27   Coagulation Lab Results  Component Value Date   INR 1.0 05/04/2020   LABPROT 12.3 05/04/2020   APTT 30 05/04/2020   PLT 328.0 12/21/2021     Cardiovascular Lab Results  Component Value Date   HGB 12.0 (L) 12/21/2021   HCT 35.6 (L) 12/21/2021     Screening Lab Results  Component Value Date   SARSCOV2NAA NEGATIVE 10/30/2021   STAPHAUREUS NEGATIVE 03/08/2018   MRSAPCR NEGATIVE 03/08/2018     Cancer No results found for: CEA, CA125, LABCA2   Allergens No results found for: ALMOND, APPLE, ASPARAGUS, AVOCADO, BANANA, BARLEY, BASIL, BAYLEAF, GREENBEAN, LIMABEAN, WHITEBEAN, BEEFIGE, REDBEET, BLUEBERRY, BROCCOLI, CABBAGE, MELON, CARROT, CASEIN, CASHEWNUT, CAULIFLOWER, CELERY     Note: Lab results reviewed.  PFSH  Drug: Anthony [name]  reports no history of drug use. Alcohol:  reports  no history of alcohol use. Tobacco:  reports that he has never smoked. He has never been exposed to tobacco smoke. He has never used smokeless tobacco. Medical:  has a past medical history of Anosmia (1980s), Atrial flutter ([place]) (2018), Basal cell carcinoma (01/11/2022), CAD (coronary artery disease), Cancer ([place]), Chronic constipation, Chronic insomnia, Chronic lower back pain, Complication of anesthesia, Diastolic CHF ([place]), History of diverticulitis (2017), History of pneumonia (2013), Hyperlipidemia, Hypertension, Hypothyroidism, Obesity, Class II,  BMI 35-39.9, with comorbidity, OSA on CPAP, and PONV (postoperative nausea and vomiting). Family: family history includes Kidney disease in his father; Leukemia in his brother; Stroke in his mother and sister.  Past Surgical History:  Procedure Laterality Date   A-FLUTTER ABLATION N/A 05/07/2017   Procedure: A-Flutter Ablation;  Surgeon: Deboraha Sprang, MD;  Location: [facility];  Service: Cardiovascular;  Laterality: N/A;   CARDIOVASCULAR STRESS TEST  11/2017   no ischemia, low risk study   COLONOSCOPY  2007   normal per prior PCP records, rpt 10 yrs (Dr Osie Cheeks)   [place] Right    ulnar nerve decompression.  PT DOES NOT RECALL THIS PROCEDURE   ENDOVASCULAR REPAIR/STENT GRAFT N/A 05/05/2020   Procedure: ENDOVASCULAR REPAIR/STENT GRAFT;  Surgeon: Algernon Huxley, MD;  Location: [facility];  Service: Cardiovascular;  Laterality: N/A;   LAMINECTOMY THORACIC SPINE W/ PLACEMENT SPINAL CORD STIMULATOR  08/2018   and removal of prior spine stimulator (Dr Lacinda Axon)   NASAL SINUS SURGERY     nasal polyps. done a long time ago   [place] (PCI-S)  2013   EF55%, 70% mid LAD, 99% mid RCA, mild MR, elev LVEDP, DES to mid LAD. RCA is nondominant   RADIOFREQUENCY ABLATION  06/2017   lumbar region [facility])    REPLACEMENT TOTAL KNEE BILATERAL Bilateral 2000s   SPINAL CORD STIMULATOR IMPLANT  06/2016   SPINAL CORD STIMULATOR INSERTION N/A 09/09/2018   Procedure: LUMBAR SPINAL CORD STIMULATOR LEAD AND BATTERY REMOVAL AND LAMINECTOMY FOR PLACEMENT OF PADDLE;  Surgeon: Deetta Perla, MD;  Location: [facility];  Service: Neurosurgery;  Laterality: N/A;   SPINAL CORD STIMULATOR INSERTION N/A 09/16/2018   Procedure: PLACEMENT OF SPINAL CORD STIMULATOR BATTERY;  Surgeon: Deetta Perla, MD;  Location: [facility];  Service: Neurosurgery;  Laterality: N/A;   Active Ambulatory Problems    Diagnosis Date Noted   History of basal cell carcinoma (BCC) 04/12/2017   History of  squamous cell carcinoma in situ of skin 04/12/2017   Hypercholesterolemia with hypertriglyceridemia 04/12/2017   Chronic constipation 04/12/2017   OSA on CPAP 04/12/2017   BPH (benign prostatic hyperplasia) 04/12/2017   History of atrial flutter 04/15/2017   Chronic insomnia    Diastolic heart failure ([place]) 02/18/2017   Essential hypertension    Hypothyroidism    Severe obesity (BMI 35.0-39.9) with comorbidity ([place])    CAD (coronary artery disease)    Chronic lower back pain    Nasal congestion 06/26/2017   Therapeutic opioid-induced constipation ([facility]) 10/03/2017   CKD (chronic kidney disease) stage 3, GFR 30-59 ml/min (HCC) 10/18/2017   S/P insertion of spinal cord stimulator 09/09/2018   Vitamin D deficiency 10/24/2018   Advanced care planning/counseling discussion 11/05/2018   Anosmia 02/17/2019   Abdominal aortic aneurysm (AAA) without rupture ([place]) 01/09/2016   CAD in native artery 07/11/2012   Calculus of gallbladder without cholecystitis 01/09/2016   Cystic kidney disease 04/15/2019   Diverticulitis of sigmoid colon 01/09/2016   Esophageal reflux 09/21/2011  Osteoarthritis 04/15/2019   Left leg pain 09/21/2011   Recurrent major depression in remission ([place]) 03/28/2013   Seasonal allergies 04/15/2019   Spinal stenosis 04/20/2011   Spondylolisthesis of lumbar region 01/09/2016   Essential tremor 05/07/2019   Encounter for chronic pain management 07/04/2019   General unsteadiness 02/07/2020   Varicose veins of leg with pain, bilateral 04/06/2020   Eosinophilia 04/11/2020   S/P endovascular aneurysm repair 04/2020   Fatigue 12/31/2020   Medicare annual wellness visit, subsequent 12/29/2021   Prediabetes 12/29/2021   Lumbar facet arthropathy 01/31/2022   Lumbar degenerative disc disease 01/31/2022   Chronic pain syndrome 01/31/2022   Resolved Ambulatory Problems    Diagnosis Date Noted   Hypercalcemia 10/26/2018   Cough 11/05/2018   Bilateral hip pain 02/17/2019    Controlled substance agreement signed 02/14/2017   Hospital discharge follow-up 03/03/2017   Nocturia 09/21/2011   Shortness of breath 02/18/2017   Right foot pain 10/08/2019   Benign prostatic hyperplasia without lower urinary tract symptoms 10/09/2011   AAA (abdominal aortic aneurysm) without rupture ([place]) 05/05/2020   Pain in lateral portion of right ankle 06/30/2020   Intractable nausea and vomiting assoc w/ presyncopal episode  10/30/2021   Nausea and vomiting    Near syncope    Past Medical History:  Diagnosis Date   Atrial flutter ([place]) 2018   Basal cell carcinoma 01/11/2022   Cancer ([place])    Complication of anesthesia    Diastolic CHF ([place])    History of diverticulitis 2017   History of pneumonia 2013   Hyperlipidemia    Hypertension    Obesity, Class II, BMI 35-39.9, with comorbidity    PONV (postoperative nausea and vomiting)    Constitutional Exam  General appearance: Well nourished, well developed, and well hydrated. In no apparent acute distress Vitals:   01/31/22 0832  BP: (!) 144/91  Pulse: 98  Temp: 98.2 F (36.8 C)  SpO2: 99%  Weight: 270 lb (122.5 kg)  Height: 6' (1.829 m)   BMI Assessment: Estimated body mass index is 36.62 kg/m as calculated from the following:   Height as of this encounter: 6' (1.829 m).   Weight as of this encounter: 270 lb (122.5 kg).  BMI interpretation table: BMI level Category Range association with higher incidence of chronic pain  <18 kg/m2 Underweight   18.5-24.9 kg/m2 Ideal body weight   25-29.9 kg/m2 Overweight Increased incidence by 20%  30-34.9 kg/m2 Obese (Class I) Increased incidence by 68%  35-39.9 kg/m2 Severe obesity (Class II) Increased incidence by 136%  >40 kg/m2 Extreme obesity (Class III) Increased incidence by 254%   Patient's current BMI Ideal Body weight  Body mass index is 36.62 kg/m. Ideal body weight: 77.6 kg (171 lb 1.2 oz) Adjusted ideal body weight: 95.5 kg (210 lb 10.3 oz)   BMI Readings  from Last 4 Encounters:  01/31/22 36.62 kg/m  12/28/21 37.31 kg/m  10/30/21 36.62 kg/m  10/12/21 37.59 kg/m   Wt Readings from Last 4 Encounters:  01/31/22 270 lb (122.5 kg)  12/28/21 275 lb 2 oz (124.8 kg)  10/30/21 270 lb (122.5 kg)  10/12/21 273 lb 5 oz (124 kg)    Psych/Mental status: Alert, oriented x 3 (person, place, & time)       Eyes: PERLA Respiratory: No evidence of acute respiratory distress  Thoracic Spine Area Exam  Skin & Axial Inspection: No masses, redness, or swelling Alignment: Symmetrical Functional ROM: Unrestricted ROM Stability: No instability detected Muscle Tone/Strength: Functionally intact. No  obvious neuro-muscular anomalies detected. Sensory (Neurological): Unimpaired Muscle strength & Tone: No palpable anomalies   Lumbar Spine Area Exam  Skin & Axial Inspection: No masses, redness, or swelling Alignment: Symmetrical Functional ROM: Pain restricted ROM      Stability: No instability detected Muscle Tone/Strength: Functionally intact. No obvious neuro-muscular anomalies detected. Sensory (Neurological): Articular pain pattern Palpation: Complains of area being tender to palpation       Provocative Tests: Lumbar Hyperextension and rotation test: Positive      left greater than right Lumbar Lateral bending test: Positive      left greater than right Patrick's Maneuver: Positive on left                   Pain with radiation to left posterior thigh on lumbar extension Gait & Posture Assessment  Ambulation: Limited Gait: Relatively normal for age and body habitus somewhat antalgic Posture: WNL    Lower Extremity Exam      Side: Right lower extremity   Side: Left lower extremity  Skin & Extremity Inspection: Skin color, temperature, and hair growth are WNL. No peripheral edema or cyanosis. No masses, redness, swelling, asymmetry, or associated skin lesions. No contractures.   Skin & Extremity Inspection: Skin color, temperature, and hair growth  are WNL. No peripheral edema or cyanosis. No masses, redness, swelling, asymmetry, or associated skin lesions. No contractures.  Functional ROM: Unrestricted ROM           Functional ROM: Unrestricted ROM          Muscle Tone/Strength: Functionally intact. No obvious neuro-muscular anomalies detected.   Muscle Tone/Strength: Functionally intact. No obvious neuro-muscular anomalies detected.  Sensory (Neurological): Unimpaired   Sensory (Neurological): Unimpaired  Palpation: No palpable anomalies   Palpation: No palpable anomalies     Assessment  Primary Diagnosis & Pertinent Problem List: The primary encounter diagnosis was Lumbar spondylosis. Diagnoses of Lumbar facet arthropathy, Lumbar degenerative disc disease, and Chronic pain syndrome were also pertinent to this visit.  Visit Diagnosis (New problems to examiner): 1. Lumbar spondylosis   2. Lumbar facet arthropathy   3. Lumbar degenerative disc disease   4. Chronic pain syndrome    Plan of Care (Initial workup plan)   86year-old male with an impressive cardiac history including atrial flutter and coronary artery disease, anticoagulated on Eliquis, presents with axial low back pain secondary to lumbar facet arthropathy and lumbar spondylosis status post left L3, L4, L5 RFA 09/19/2017.  Patient's chronic low back pain secondary to lumbar degenerative disc disease, lumbar spondylosis without myelopathy, lumbar facet arthropathy. Patient also has chronic lumbosacral radiculopathy for which she has tried transforaminal and epidural steroid injections in the past which were not very effective. He is status post [employer] spinal cord stimulator, placed 07/18/2016, model 3662 at T8 bilaterally.  Given that his left L3, L4, L5 RFA was in 2019, we discussed repeating therapeutic intervention as this was helpful in the past.  Risk and benefits reviewed and patient would like to proceed.  We will need to get cardiac clearance to stop his Plavix 7 days  prior.  He can continue aspirin 81 mg during the interim.  Future considerations include Sprint peripheral nerve stimulation.  Orders Placed This Encounter  Procedures   Radiofrequency,Lumbar    Standing Status:   Future    Standing Expiration Date:   02/01/2023    Scheduling Instructions:     Side(s): Left-sided     Level: L3-4, L4-5, & L5-S1 Facets (  L3, L4, L5,  Medial Branch Nerves)     Sedation:IV Versed     Scheduling Timeframe: As soon as pre-approved    Order Specific Question:   Where will this procedure be performed?    Answer:   [facility]      Procedure Orders         Radiofrequency,Lumbar         Provider-requested follow-up: Return in about 2 weeks (around 02/14/2022) for Left L3, 4, 5 RFA with , in clinic IV Versed (call PCP to get clearance to stop Plavix).  Future Appointments  Date Time Provider [place]  03/20/2022 11:00 AM [facility] PHARMACIST [facility]  04/25/2022  8:30 AM [facility] LAB [facility]  05/02/2022 10:30 AM Ria Bush, MD [facility]  07/06/2022  3:45 PM Ralene Bathe, MD [facility] None    Note by: Gillis Santa, MD Date: 01/31/2022; Time: 9:00 AM

## 2022-02-03 NOTE — Telephone Encounter (Signed)
Patient is on aspirin and plavix.  Plavix is prescribed by [facility], pt last saw them 11/2020 so due for f/u visit. Missed appt 12/30/2021.  Will forward request to [facility].

## 2022-02-03 NOTE — Telephone Encounter (Signed)
He had a follow up letter only study this year, which was good. Based on this he can hold his Plavix for 7 days.    TANYA- He does need a follow up for next year with an [facility]

## 2022-02-05 ENCOUNTER — Other Ambulatory Visit: Payer: Self-pay | Admitting: Family Medicine

## 2022-02-05 ENCOUNTER — Other Ambulatory Visit: Payer: Self-pay | Admitting: Dermatology

## 2022-02-05 DIAGNOSIS — B353 Tinea pedis: Secondary | ICD-10-CM

## 2022-02-05 DIAGNOSIS — R21 Rash and other nonspecific skin eruption: Secondary | ICD-10-CM

## 2022-02-06 NOTE — Telephone Encounter (Signed)
Refill request Tizanidine Last refill 12/26/21 #30 Last office visit 12/28/21

## 2022-02-08 ENCOUNTER — Telehealth: Payer: Self-pay

## 2022-02-08 NOTE — Telephone Encounter (Signed)
ERx 

## 2022-02-08 NOTE — Telephone Encounter (Signed)
-----   Message from Ralene Bathe, MD sent at 01/13/2022  6:33 PM EDT ----- Diagnosis Skin , left cheek BASAL CELL CARCINOMA, NODULAR AND INFILTRATIVE PATTERNS, BASE INVOLVED  Cancer - BCC Schedule surgery

## 2022-02-08 NOTE — Telephone Encounter (Signed)
Left pt msg to call for bx results/sh 

## 2022-02-09 ENCOUNTER — Telehealth: Payer: Self-pay

## 2022-02-09 NOTE — Telephone Encounter (Signed)
-----   Message from Ralene Bathe, MD sent at 01/13/2022  6:33 PM EDT ----- Diagnosis Skin , left cheek BASAL CELL CARCINOMA, NODULAR AND INFILTRATIVE PATTERNS, BASE INVOLVED  Cancer - BCC Schedule surgery

## 2022-02-09 NOTE — Telephone Encounter (Signed)
Left message on son's voicemail to return my call.

## 2022-02-15 ENCOUNTER — Telehealth: Payer: Self-pay

## 2022-02-15 ENCOUNTER — Ambulatory Visit
Payer: Medicare Other | Attending: Student in an Organized Health Care Education/Training Program | Admitting: Student in an Organized Health Care Education/Training Program

## 2022-02-15 ENCOUNTER — Encounter: Payer: Self-pay | Admitting: Student in an Organized Health Care Education/Training Program

## 2022-02-15 ENCOUNTER — Ambulatory Visit
Admission: RE | Admit: 2022-02-15 | Discharge: 2022-02-15 | Disposition: A | Discharge status: Home / Self Care | Payer: Medicare Other | Source: Ambulatory Visit | Attending: Student in an Organized Health Care Education/Training Program | Admitting: Student in an Organized Health Care Education/Training Program

## 2022-02-15 VITALS — BP 145/94 | HR 86 | Temp 98.4°F | Resp 16 | Ht 72.0 in | Wt 270.0 lb

## 2022-02-15 DIAGNOSIS — M47816 Spondylosis without myelopathy or radiculopathy, lumbar region: Secondary | ICD-10-CM | POA: Diagnosis not present

## 2022-02-15 DIAGNOSIS — G894 Chronic pain syndrome: Secondary | ICD-10-CM | POA: Diagnosis not present

## 2022-02-15 DIAGNOSIS — M5136 Other intervertebral disc degeneration, lumbar region: Secondary | ICD-10-CM | POA: Insufficient documentation

## 2022-02-15 MED ORDER — ROPIVACAINE HCL 2 MG/ML IJ SOLN
9.0000 mL | Freq: Once | INTRAMUSCULAR | Status: AC
Start: 2022-02-15 — End: 2022-02-15
  Administered 2022-02-15: 9 mL via PERINEURAL
  Filled 2022-02-15: qty 20

## 2022-02-15 MED ORDER — LIDOCAINE HCL 2 % IJ SOLN
20.0000 mL | Freq: Once | INTRAMUSCULAR | Status: AC
  Administered 2022-02-15: 400 mg
  Filled 2022-02-15: qty 20

## 2022-02-15 MED ORDER — DEXAMETHASONE SODIUM PHOSPHATE 10 MG/ML IJ SOLN
10.0000 mg | Freq: Once | INTRAMUSCULAR | Status: AC
  Administered 2022-02-15: 10 mg
  Filled 2022-02-15: qty 1

## 2022-02-15 MED ORDER — MIDAZOLAM HCL 5 MG/5ML IJ SOLN
0.5000 mg | Freq: Once | INTRAMUSCULAR | Status: AC
  Administered 2022-02-15: 0.5 mg via INTRAVENOUS
  Filled 2022-02-15: qty 5

## 2022-02-15 NOTE — Progress Notes (Signed)
Patient's Name: Anthony [name]  MRN: [id number]  Referring Provider: Ria Bush, MD  DOB: Jan 07, 1936  PCP: Ria Bush, MD  DOS: 02/15/2022  Note by: Gillis Santa, MD  Service setting: Ambulatory outpatient  Specialty: Interventional Pain Management  Patient type: Established  Location: [facility] (AMB) Pain Management Facility  Visit type: Interventional Procedure   Primary Reason for Visit: Interventional Pain Management Treatment. CC: Back Pain (lower)  Procedure:  Anesthesia, Analgesia, Anxiolysis:  Type: Therapeutic Medial Branch Facet Radiofrequency Ablation #2 (#1 09-19-2017) Region: Lumbar Level:  L3, L4, L5, Medial Branch Level(s) Laterality: Left-Sided  Type: Local Anesthesia with minimal sedation, 0.5 mg IV Versed x 1 Local Anesthetic: Lidocaine 1% Route: Intravenous (IV) IV Access: Secured Sedation: Meaningful verbal contact was maintained at all times during the procedure  Indication(s): Analgesia and Anxiety   Indications: 1. Lumbar spondylosis   2. Lumbar facet arthropathy   3. Lumbar degenerative disc disease   4. Chronic pain syndrome    Mr. Reinard has either failed to respond, was unable to tolerate, or simply did not get enough benefit from other more conservative therapies including, but not limited to: 1. Over-the-counter medications 2. Anti-inflammatory medications 3. Muscle relaxants 4. Membrane stabilizers 5. Opioids 6. Physical therapy 7. Modalities (Heat, ice, etc.) 8. Invasive techniques such as nerve blocks. Mr. Swett has attained more than 50% relief of the pain from a series of diagnostic injections conducted in separate occasions.  Pain Score: Pre-procedure: 10-Worst pain ever/10 Post-procedure: 7/10  Patient stopped Plavix 7 days prior.  Was on 81 mg aspirin.  Pre-op Assessment:  Mr. Willcutt is a 86 y.o. (year old), male patient, seen today for interventional treatment. He  has a past surgical history that includes Percutaneous  coronary stent intervention (pci-s) (2013); Replacement total knee bilateral (Bilateral, 2000s); Spinal cord stimulator implant (06/2016); Nasal sinus surgery; A-FLUTTER ABLATION (N/A, 05/07/2017); Radiofrequency ablation (06/2017); Colonoscopy (2007); Cardiovascular stress test (11/2017); Elbow surgery (Right); Laminectomy thoracic spine w/ placement spinal cord stimulator (08/2018); Spinal cord stimulator insertion (N/A, 09/09/2018); Spinal cord stimulator insertion (N/A, 09/16/2018); and ENDOVASCULAR REPAIR/STENT GRAFT (N/A, 05/05/2020). Mr. Gelles has a current medication list which includes the following prescription(s): acetaminophen, praluent, amlodipine, aspirin ec, vitamin d3, ciclopirox, clopidogrel, diphenhydramine-acetaminophen, docusate sodium, eszopiclone, ezetimibe, finasteride, fluocinonide, furosemide, gabapentin, icosapent ethyl, ketoconazole, ketoconazole, levothyroxine, linaclotide, lisinopril, melatonin, meloxicam, mometasone, mometasone, multiple vitamins-minerals, multiple vitamins-minerals, nitroglycerin, ondansetron, oxycodone-acetaminophen, senna, tamsulosin, tizanidine, and trazodone. His primarily concern today is the Back Pain (lower)  Initial Vital Signs: There were no vitals taken for this visit. BMI: Estimated body mass index is 36.62 kg/m as calculated from the following:   Height as of this encounter: 6' (1.829 m).   Weight as of this encounter: 270 lb (122.5 kg).  Risk Assessment: Allergies: Reviewed. He is allergic to [place] [cholestyramine], atorvastatin, gemfibrozil, metformin and related, and rosuvastatin.  Allergy Precautions: None required Coagulopathies: Reviewed. None identified.  Blood-thinner therapy: None at this time Active Infection(s): Reviewed. None identified. Mr. Goodlin is afebrile  Site Confirmation: Mr. Eberlein was asked to confirm the procedure and laterality before marking the site Procedure checklist: Completed Consent: Before the procedure and  under the influence of no sedative(s), amnesic(s), or anxiolytics, the patient was informed of the treatment options, risks and possible complications. To fulfill our ethical and legal obligations, as recommended by the American Medical Association's Code of Ethics, I have informed the patient of my clinical impression; the nature and purpose of the treatment or procedure; the risks, benefits, and possible complications of  the intervention; the alternatives, including doing nothing; the risk(s) and benefit(s) of the alternative treatment(s) or procedure(s); and the risk(s) and benefit(s) of doing nothing. The patient was provided information about the general risks and possible complications associated with the procedure. These may include, but are not limited to: failure to achieve desired goals, infection, bleeding, organ or nerve damage, allergic reactions, paralysis, and death. In addition, the patient was informed of those risks and complications associated to Spine-related procedures, such as failure to decrease pain; infection (i.e.: Meningitis, epidural or intraspinal abscess); bleeding (i.e.: epidural hematoma, subarachnoid hemorrhage, or any other type of intraspinal or peri-dural bleeding); organ or nerve damage (i.e.: Any type of peripheral nerve, nerve root, or spinal cord injury) with subsequent damage to sensory, motor, and/or autonomic systems, resulting in permanent pain, numbness, and/or weakness of one or several areas of the body; allergic reactions; (i.e.: anaphylactic reaction); and/or death. Furthermore, the patient was informed of those risks and complications associated with the medications. These include, but are not limited to: allergic reactions (i.e.: anaphylactic or anaphylactoid reaction(s)); adrenal axis suppression; blood sugar elevation that in diabetics may result in ketoacidosis or comma; water retention that in patients with history of congestive heart failure may result in  shortness of breath, pulmonary edema, and decompensation with resultant heart failure; weight gain; swelling or edema; medication-induced neural toxicity; particulate matter embolism and blood vessel occlusion with resultant organ, and/or nervous system infarction; and/or aseptic necrosis of one or more joints. Finally, the patient was informed that Medicine is not an exact science; therefore, there is also the possibility of unforeseen or unpredictable risks and/or possible complications that may result in a catastrophic outcome. The patient indicated having understood very clearly. We have given the patient no guarantees and we have made no promises. Enough time was given to the patient to ask questions, all of which were answered to the patient's satisfaction. Mr. Matus has indicated that he wanted to continue with the procedure. Attestation: I, the ordering provider, attest that I have discussed with the patient the benefits, risks, side-effects, alternatives, likelihood of achieving goals, and potential problems during recovery for the procedure that I have provided informed consent. Date: 02/15/2022; Time: 8:02 AM  Pre-Procedure Preparation:  Monitoring: As per clinic protocol. Respiration, ETCO2, SpO2, BP, heart rate and rhythm monitor placed and checked for adequate function Safety Precautions: Patient was assessed for positional comfort and pressure points before starting the procedure. Time-out: I initiated and conducted the "Time-out" before starting the procedure, as per protocol. The patient was asked to participate by confirming the accuracy of the "Time Out" information. Verification of the correct person, site, and procedure were performed and confirmed by me, the nursing staff, and the patient. "Time-out" conducted as per Joint Commission's Universal Protocol (UP.01.01.01). "Time-out" Date & Time: 02/15/2022; 0939 hrs.  Description of Procedure Process:   Position: Prone Target Area: For  Lumbar Facet blocks, the target is the groove formed by the junction of the transverse process and superior articular process. For the L5 dorsal ramus, the target is the notch between superior articular process and sacral ala.  Approach: Paraspinal approach. Area Prepped: Entire Posterior Lumbosacral Region Prepping solution: Hibiclens (4.0% Chlorhexidine gluconate solution) Safety Precautions: Aspiration looking for blood return was conducted prior to all injections. At no point did we inject any substances, as a needle was being advanced. No attempts were made at seeking any paresthesias. Safe injection practices and needle disposal techniques used. Medications properly checked for expiration dates. SDV (  single dose vial) medications used. Description of the Procedure: Protocol guidelines were followed. The patient was placed in position over the procedure table. The target area was identified and the area prepped in the usual manner. The skin and muscle were infiltrated with local anesthetic. Appropriate amount of time allowed to pass for local anesthetics to take effect. Radiofrequency needles were introduced to the target area using fluoroscopic guidance. Using the [employer]onic  Radiofrequency Generator, sensory stimulation using 50 Hz was used to locate & identify the nerve, making sure that the needle was positioned such that there was no sensory stimulation below 0.3 V or above 0.7 V. Stimulation using 2 Hz was used to evaluate the motor component. Care was taken not to lesion any nerves that demonstrated motor stimulation of the lower extremities at an output of less than 2.5 times that of the sensory threshold, or a maximum of 2.0 V. Once satisfactory placement of the needles was achieved, the numbing solution was slowly injected after negative aspiration. After waiting for at least 2 minutes, the ablation was performed at 80 degrees C for 60 seconds, using regular Radiofrequency settings. Once the  procedure was completed, the needles were then removed and the area cleansed, making sure to leave some of the prepping solution back to take advantage of its long term bactericidal properties. Intra-operative Compliance: Compliant  Illustration of the posterior view of the lumbar spine and the posterior neural structures. Laminae of L2 through S1 are labeled. DPRL5, dorsal primary ramus of L5; DPRS1, dorsal primary ramus of S1; DPR3, dorsal primary ramus of L3; FJ, facet (zygapophyseal) joint L3-L4; I, inferior articular process of L4; LB1, lateral branch of dorsal primary ramus of L1; IAB, inferior articular branches from L3 medial branch (supplies L4-L5 facet joint); IBP, intermediate branch plexus; MB3, medial branch of dorsal primary ramus of L3; NR3, third lumbar nerve root; S, superior articular process of L5; SAB, superior articular branches from L4 (supplies L4-5 facet joint also); TP3, transverse process of L3.  Vitals:   02/15/22 0946 02/15/22 0951 02/15/22 0956 02/15/22 1005  BP: (!) 149/103 (!) 147/99 (!) 145/94 (!) (P) 150/99  Pulse:    86  Resp: '20 20 20 16  '$ Temp:      SpO2: 96% 96% 94% 97%  Weight:      Height:        Start Time: 0940 hrs. End Time: 0958 hrs. Materials & Medications:  Needle(s) Type: Teflon-coated, curved tip, Radiofrequency needle(s) Gauge: 22G Length: 10cm Medication(s): We administered lidocaine, midazolam, dexamethasone, and ropivacaine (PF) 2 mg/mL (0.2%). Please see chart orders for dosing details.   6 cc solution made of 5cc of 0.2% ropivacaine, 1 cc of Decadron 10 mg/cc.  2 cc injected at each level above on the left after sensorimotor testing, prior to lesioning.  Imaging Guidance (Spinal):  Type of Imaging Technique: Fluoroscopy Guidance (Spinal) Indication(s): Assistance in needle guidance and placement for procedures requiring needle placement in or near specific anatomical locations not easily accessible without such assistance. Exposure  Time: Please see nurses notes. Contrast: None used. Fluoroscopic Guidance: I was personally present during the use of fluoroscopy. "Tunnel Vision Technique" used to obtain the best possible view of the target area. Parallax error corrected before commencing the procedure. "Direction-depth-direction" technique used to introduce the needle under continuous pulsed fluoroscopy. Once target was reached, antero-posterior, oblique, and lateral fluoroscopic projection used confirm needle placement in all planes. Images permanently stored in EMR. Interpretation: No contrast injected. I personally interpreted the  imaging intraoperatively. Adequate needle placement confirmed in multiple planes. Permanent images saved into the patient's record.  Antibiotic Prophylaxis:  Indication(s): None identified Antibiotic given: None  Post-operative Assessment:  EBL: None Complications: No immediate post-treatment complications observed by team, or reported by patient. Note: The patient tolerated the entire procedure well. A repeat set of vitals were taken after the procedure and the patient was kept under observation following institutional policy, for this type of procedure. Post-procedural neurological assessment was performed, showing return to baseline, prior to discharge. The patient was provided with post-procedure discharge instructions, including a section on how to identify potential problems. Should any problems arise concerning this procedure, the patient was given instructions to immediately contact us, at any time, without hesitation. In any case, we plan to contact the patient by telephone for a follow-up status report regarding this interventional procedure. Comments:  No additional relevant information. 5 out of 5 strength bilateral lower extremity: Plantar flexion, dorsiflexion, knee flexion, knee extension.  Plan of Care  Follow-up in 4-6 weeks for postprocedural evaluation.   Imaging Orders          DG PAIN [facility] C-ARM 1-60 MIN NO REPORT    Procedure Orders    No procedure(s) ordered today   Okay to restart Plavix tomorrow after nursing phone call. Medications ordered for procedure: Meds ordered this encounter  Medications   lidocaine (XYLOCAINE) 2 % (with pres) injection 400 mg   midazolam (VERSED) 5 MG/5ML injection 0.5-2 mg    Make sure Flumazenil is available in the pyxis when using this medication. If oversedation occurs, administer 0.2 mg IV over 15 sec. If after 45 sec no response, administer 0.2 mg again over 1 min; may repeat at 1 min intervals; not to exceed 4 doses (1 mg)   dexamethasone (DECADRON) injection 10 mg   ropivacaine (PF) 2 mg/mL (0.2%) (NAROPIN) injection 9 mL   Medications administered: We administered lidocaine, midazolam, dexamethasone, and ropivacaine (PF) 2 mg/mL (0.2%).  See the medical record for exact dosing, route, and time of administration.  New Prescriptions   No medications on file   Disposition: Discharge home  Discharge Date & Time: 02/15/2022; 1015 hrs.   Physician-requested Follow-up: Return in about 6 weeks (around 03/29/2022) for Post Procedure Evaluation, virtual. Future Appointments  Date Time Provider Kimberly  03/20/2022 11:00 AM [facility] PHARMACIST [facility]  03/29/2022  3:40 PM Gillis Santa, MD [facility] None  04/25/2022  8:30 AM [facility] LAB [facility]  05/02/2022 10:30 AM Ria Bush, MD [facility]  07/06/2022  3:45 PM Ralene Bathe, MD [facility] None   Primary Care Physician: Ria Bush, MD Location: [facility] Out[facility] Note by: Gillis Santa, MD Date: 02/15/2022; Time: 10:34 AM  Disclaimer:  Medicine is not an exact science. The only guarantee in medicine is that nothing is guaranteed. It is important to note that the decision to proceed with this intervention was based on the information collected from the patient. The Data and conclusions were drawn from the patient's  questionnaire, the interview, and the physical examination. Because the information was provided in large part by the patient, it cannot be guaranteed that it has not been purposely or unconsciously manipulated. Every effort has been made to obtain as much relevant data as possible for this evaluation. It is important to note that the conclusions that lead to this procedure are derived in large part from the available data. Always take into account that the treatment will also be dependent  on availability of resources and existing treatment guidelines, considered by other Pain Management Practitioners as being common knowledge and practice, at the time of the intervention. For Medico-Legal purposes, it is also important to point out that variation in procedural techniques and pharmacological choices are the acceptable norm. The indications, contraindications, technique, and results of the above procedure should only be interpreted and judged by a Board-Certified Interventional Pain Specialist with extensive familiarity and expertise in the same exact procedure and technique.

## 2022-02-15 NOTE — Progress Notes (Signed)
Safety precautions to be maintained throughout the outpatient stay will include: orient to surroundings, keep bed in low position, maintain call bell within reach at all times, provide assistance with transfer out of bed and ambulation.  

## 2022-02-15 NOTE — Telephone Encounter (Signed)
Spoke with patient's son today and he has been made aware of BX results. Advised patient does need surgery to remove deeper and wider margins. Son will have the patient call to schedule surgery himself. aw

## 2022-02-15 NOTE — Patient Instructions (Addendum)

## 2022-02-16 ENCOUNTER — Other Ambulatory Visit: Payer: Self-pay | Admitting: Dermatology

## 2022-02-16 DIAGNOSIS — L219 Seborrheic dermatitis, unspecified: Secondary | ICD-10-CM

## 2022-02-16 NOTE — Telephone Encounter (Signed)
Patient came into office and surgery has been scheduled. aw

## 2022-02-23 ENCOUNTER — Telehealth: Payer: Self-pay

## 2022-02-23 NOTE — Telephone Encounter (Signed)
Patient received BX letter in the mail.  He brought this in and advised Lovena Le we could keep our "nasty" letter and he was surprised we wanted to wait until August for surgery. aw

## 2022-02-27 ENCOUNTER — Other Ambulatory Visit: Payer: Self-pay | Admitting: Family Medicine

## 2022-03-13 NOTE — Telephone Encounter (Signed)
Error

## 2022-03-15 ENCOUNTER — Telehealth: Payer: Self-pay

## 2022-03-15 NOTE — Progress Notes (Signed)
Chronic Care Management Pharmacy Assistant   Name: Anthony [name]  MRN: [id number] DOB: 1936/04/06  Reason for Encounter: CCM (Appointment Reminder)   Recent office visits:  01/04/22 Telephone: Referral to Pain Clinic 12/28/21 Ria Bush, MD AWV A1C 6.2 No med changes FU 4 months 09/12/2021 - Ria Bush, MD - Sleep Apnea. Increase dose: Trazodone 50 mg 07/01/2021 - Ria Bush, MD - Patient Message - Ordered: CPAP device.  04/29/2021 - Ria Bush, MD - Patient Message - Referral for sleep study to [facility].  04/06/2021 - Ria Bush, MD - Patient presented for follow up for chronic midline low back pain. Start: senna (SENOKOT) 8.6 MG tablet due to constipation.  03/22/2021 - Ria Bush, MD - Telephone - Patient is refusing new sleep study as his current study is expired. Patient states he will stop using CPAP.  03/10/2021 - Ria Bush, MD - Telephone - Referral to Neurology for sleep study.   Recent consult visits:  02/15/22 Radiofrequency Ablation  01/31/22 Gillis Santa, MD (Pain Medicine) Lumbar spondylosis Procedure: LUMBAR PUNCTURE FOR THORACIC MYELOGRAM  Ordered; Radiofrequency Lumbar 01/31/22 Telephone: Dr. Holley Raring requesting to hold Plavix for 7 days for Radiofrequency ablation lumbar - Eulogio Ditch, NP agrees patient can hold.  01/17/22 Derm results: Skin , left cheek BASAL CELL CARCINOMA, NODULAR AND INFILTRATIVE PATTERNS, BASE INVOLVED 01/11/22 Sarina Ser, MD (Derm) Epidermal inclusion cyst  Procedure: AK Cryotherapy face Start: mometasone (ELOCON) 0.1 % cream 12/06/21 VAS [name] EVAR DUPLEX 10/12/2021 - Owens Loffler, MD - Family Med - Spinal Stenosis Lumbar Region. Start: PERCOCET 7.5-325 MG and DELTASONE 20 MG. Stop: NORCO 10-325 MG. 09/06/2021 - Gerrit Halls, PA - Ortho - lumbar radiculopathy. No med changes. 09/02/2021 Danae Orleans, PA - lumbar radiculopathy. No other information.  08/15/2021 - Renee Harder - Orthopedic Surgery  - pain in right leg. No other information.  08/15/2021 Danae Orleans, PA - lumbar radiculopathy. No other information.  06/23/2021 - Sleep Study - Patient presented for sleep study.  05/31/2021 - Cardiology - Patient presented for follow up for typical atrial flutter. Labs: TSH and EKG. No medication changes.  03/01/2021 - Neurosurgery - Patient presented for evaluation of a failed spinal cord stimulator in terms of this is no longer controlling any pain. Planned surgery: removal of spinal cord stimulator battery (removal of battery only). Surgery date: 03/21/21.   Hospital visits:  Medication Reconciliation was completed by comparing discharge summary, patient's EMR and Pharmacy list, and upon discussion with patient.   Admitted to the hospital on 10/30/2021 due to nausea and vomiting. Discharge date was 10/31/2021. Discharged from [facility].     Final Diagnoses: Near Syncope   New Medications Started at [facility] Discharge:?? -started [facility] 4 MG   Medication Changes at Hospital Discharge: -Changed Cheratussin AC 100-10 mg/5 ML syrup -Changed Prednisone 20 mg   Medications that remain the same after Hospital Discharge:??  -All other medications will remain the same.    Medications: Outpatient Encounter Medications as of 03/15/2022  Medication Sig Note   acetaminophen (TYLENOL) 500 MG tablet Take 1 tablet (500 mg total) by mouth 2 (two) times daily as needed for moderate pain.    Alirocumab (PRALUENT) 75 MG/ML SOAJ Inject 1 pen. into the skin every 14 (fourteen) days.    amLODipine (NORVASC) 5 MG tablet TAKE 1 TABLET(5 MG) BY MOUTH DAILY    aspirin EC 81 MG tablet Take 1 tablet (81 mg total) by mouth daily.    Cholecalciferol (VITAMIN D3) 1000 units  CAPS Take 1 capsule (1,000 Units total) by mouth daily.    ciclopirox (PENLAC) 8 % solution APPLY TOPICALLY AT BEDTIME APPLY OVER NAIL AND SURROUNDING SKIN, APPLY DAILY OVER PREVIOUS COAT, AFTER 7 DAYS MAY REMOVE WITH  ALCOHOL AND REPEAT    clopidogrel (PLAVIX) 75 MG tablet TAKE 1 TABLET(75 MG) BY MOUTH DAILY AT 6 AM 02/15/2022: Stop Plavix 7 days ago   diphenhydramine-acetaminophen (TYLENOL PM) 25-500 MG TABS tablet Take 2 tablets by mouth at bedtime.    docusate sodium (COLACE) 100 MG capsule Take 100 mg by mouth daily.    Eszopiclone 3 MG TABS Take 1 tablet (3 mg total) by mouth at bedtime as needed. Use sparingly, take immediately before bedtime    ezetimibe (ZETIA) 10 MG tablet Take 1 tablet (10 mg total) by mouth daily.    finasteride (PROSCAR) 5 MG tablet TAKE 1 TABLET(5 MG) BY MOUTH DAILY    fluocinonide (LIDEX) 0.05 % external solution Apply 1 application topically 2 (two) times daily as needed. (Patient taking differently: Apply 1 application  topically daily as needed (irritation).)    furosemide (LASIX) 40 MG tablet TAKE 1 TABLET BY MOUTH EVERY DAY. TAKE AN EXTRA TABLET AS NEEDED FOR WEIGHT GAIN OF 3 LBS/DAY OR 5LBS/WEEK    gabapentin (NEURONTIN) 300 MG capsule TAKE 2 CAPSULE BY MOUTH AT BEDTIME WITH EXTRA CAPSULE DURING THE DAY AS NEEDED    icosapent Ethyl (VASCEPA) 1 g capsule Take 2 capsules (2 g total) by mouth 2 (two) times daily.    ketoconazole (NIZORAL) 2 % cream APPLY TO FEET AND FACE AT BEDTIME AS DIRECTED    ketoconazole (NIZORAL) 2 % shampoo APPLY TOPICALLY 3 TIMES WEEKLY AS DIRECTED    levothyroxine (SYNTHROID) 150 MCG tablet TAKE 1 TABLET(150 MCG) BY MOUTH DAILY BEFORE BREAKFAST    linaclotide (LINZESS) 290 MCG CAPS capsule Take 1 capsule (290 mcg total) by mouth daily before breakfast.    lisinopril (ZESTRIL) 10 MG tablet TAKE 1 TABLET(10 MG) BY MOUTH DAILY    Melatonin 10 MG CAPS Take 10 mg by mouth at bedtime.    meloxicam (MOBIC) 15 MG tablet Take 1 tablet (15 mg total) by mouth daily.    mometasone (ELOCON) 0.1 % cream APPLY TOPICALLY TO THE AFFECTED AREA DAILY AS NEEDED FOR RASH    mometasone (ELOCON) 0.1 % lotion APPLY TO SCALP AT BEDTIME ON MONDAY, WEDNESDAY, AND FRIDAY     Multiple Vitamins-Minerals (CENTRUM SILVER PO) Take 1 tablet by mouth daily.    Multiple Vitamins-Minerals (PRESERVISION AREDS 2 PO) Take 1 tablet by mouth daily.    nitroGLYCERIN (NITROSTAT) 0.4 MG SL tablet Place 1 tablet (0.4 mg total) under the tongue every 5 (five) minutes as needed for chest pain.    ondansetron (ZOFRAN) 4 MG tablet Take 1 tablet (4 mg total) by mouth every 6 (six) hours as needed for nausea.    oxyCODONE-acetaminophen (PERCOCET) 7.5-325 MG tablet Take 1 tablet by mouth 3 (three) times daily as needed for severe pain.    senna (SENOKOT) 8.6 MG tablet Take 1 tablet (8.6 mg total) by mouth daily.    tamsulosin (FLOMAX) 0.4 MG CAPS capsule Take 1 capsule (0.4 mg total) by mouth daily.    tiZANidine (ZANAFLEX) 2 MG tablet TAKE 1 TO 2 TABLETS(2 TO 4 MG) BY MOUTH TWICE DAILY AS NEEDED FOR MUSCLE SPASMS    traZODone (DESYREL) 50 MG tablet Take 1 tablet (50 mg total) by mouth at bedtime as needed for sleep.    No  facility-administered encounter medications on file as of 03/15/2022.   [name] was contacted to remind of upcoming telephone visit with Charlene Brooke  on 03/20/22 at 11:00. Patient was reminded to have any blood glucose and blood pressure readings available for review at appointment.   Message was left reminding patient of appointment.  CCM referral has been placed prior to visit?  Yes   Star Rating Drugs: Medication:  Last Fill: Day Supply Lisinopril 10 mg 01/16/22 [place], CPP notified  Marijean Niemann, [place] Pharmacy Assistant [phone]

## 2022-03-20 ENCOUNTER — Ambulatory Visit (INDEPENDENT_AMBULATORY_CARE_PROVIDER_SITE_OTHER): Payer: Medicare Other | Admitting: Pharmacist

## 2022-03-20 DIAGNOSIS — E782 Mixed hyperlipidemia: Secondary | ICD-10-CM

## 2022-03-20 DIAGNOSIS — N1831 Chronic kidney disease, stage 3a: Secondary | ICD-10-CM

## 2022-03-20 DIAGNOSIS — K5909 Other constipation: Secondary | ICD-10-CM

## 2022-03-20 DIAGNOSIS — I10 Essential (primary) hypertension: Secondary | ICD-10-CM

## 2022-03-20 DIAGNOSIS — I2510 Atherosclerotic heart disease of native coronary artery without angina pectoris: Secondary | ICD-10-CM

## 2022-03-20 DIAGNOSIS — I5032 Chronic diastolic (congestive) heart failure: Secondary | ICD-10-CM

## 2022-03-20 NOTE — Progress Notes (Signed)
Chronic Care Management Pharmacy Note  03/27/2022 Name:  Anthony [name] MRN:  [id number] DOB:  [birth year]  Summary: CCM F/U visit -Reviewed medications; pt affirms compliance -Pt reports occasional use of Aleve for pain; he is taking meloxicam daily, discussed interaction/duplicate therapy risks including GI bleeding and kidney injury -TRIG were 516 in May, pt was probably not taking ezetimib at that time. He is now compliant with Praulent, ezetimibe and Vascepa  Recommendations/Changes made from today's visit: -Advised to limit use of OTC NSAIDs (Aleve, Advil, etc) -Repeat lipid panel at upcoming CPE  Plan: -[place] will call patient 3 months for general update -Pharmacist follow up televisit scheduled for 6 months -PCP CPE 05/02/22    Subjective: Anthony [name] is an 86 y.o. year old male who is a primary patient of Ria Bush, MD.  The CCM team was consulted for assistance with disease management and care coordination needs.    Engaged with patient by telephone for follow up visit in response to provider referral for pharmacy case management and/or care coordination services.   Consent to Services:  The patient was given information about Chronic Care Management services, agreed to services, and gave verbal consent prior to initiation of services.  Please see initial visit note for detailed documentation.   Patient Care Team: Ria Bush, MD as PCP - General (Family Medicine) Gillis Santa, MD as Consulting Physician (Pain Medicine) Deboraha Sprang, MD as Consulting Physician (Cardiology) Charlton Haws, [facility] as Pharmacist (Pharmacist)  Recent office visits: 01/04/22 Telephone: Referral to Pain Clinic 12/28/21 Ria Bush, MD AWV A1C 6.2. Need to verify that he is on Praluent, Vascepa, and Ezetimibe (TRIG > 500) 09/12/2021 - Ria Bush, MD - Sleep Apnea. Increase dose: Trazodone 50 mg 07/01/2021 - Ria Bush, MD - Patient  Message - Ordered: CPAP device.  04/29/2021 - Ria Bush, MD - Patient Message - Referral for sleep study to [facility].  04/06/2021 - Ria Bush, MD - Patient presented for follow up for chronic midline low back pain. Start: senna (SENOKOT) 8.6 MG tablet due to constipation.  03/22/2021 - Ria Bush, MD - Telephone - Patient is refusing new sleep study as his current study is expired. Patient states he will stop using CPAP.  03/10/2021 - Ria Bush, MD - Telephone - Referral to Neurology for sleep study.   Recent consult visits: 02/15/22 Radiofrequency Ablation  01/31/22 Gillis Santa, MD (Pain Medicine) Lumbar spondylosis Procedure: LUMBAR PUNCTURE FOR THORACIC MYELOGRAM  01/17/22 Derm results: Skin , left cheek BASAL CELL CARCINOMA, NODULAR AND INFILTRATIVE PATTERNS, BASE INVOLVED 01/11/22 Sarina Ser, MD (Derm) Epidermal inclusion cyst  Procedure: AK Cryotherapy face Start: mometasone (ELOCON) 0.1 % cream 12/06/21 VAS [name] EVAR DUPLEX 10/12/2021 - Owens Loffler, MD - Family Med - Spinal Stenosis Lumbar Region. Start: PERCOCET 7.5-325 MG and Prednisone 20 MG. Stop: NORCO 10-325 MG. 09/06/2021 - Gerrit Halls, PA - Ortho - lumbar radiculopathy. No med changes. 09/02/2021 Danae Orleans, PA - lumbar radiculopathy. No other information.  08/15/2021 - Renee Harder - Orthopedic Surgery - pain in right leg. No other information.  08/15/2021 Danae Orleans, PA - lumbar radiculopathy. No other information.  06/23/2021 - Sleep Study - Patient presented for sleep study.  05/31/2021 - Cardiology - Patient presented for follow up for typical atrial flutter. Labs: TSH and EKG. No medication changes.  03/01/2021 - Neurosurgery - Patient presented for evaluation of a failed spinal cord stimulator in terms of this is no longer controlling any pain. Planned  surgery: removal of spinal cord stimulator battery (removal of battery only). Surgery date: 03/21/21.   Hospital visits: 10/30/21  - 10/31/21 Admission [facility]): intractable N/V w/ presyncope. Consider vestibular rehab tx.   Objective:  Lab Results  Component Value Date   CREATININE 1.31 12/21/2021   BUN 25 (H) 12/21/2021   GFR 50.09 (L) 12/21/2021   GFRNONAA >60 10/31/2021   GFRAA 45 (L) 05/06/2020   NA 139 12/21/2021   K 4.2 12/21/2021   CALCIUM 10.3 12/21/2021   CO2 31 12/21/2021   GLUCOSE 121 (H) 12/21/2021    Lab Results  Component Value Date/Time   HGBA1C 6.2 (A) 12/28/2021 11:22 AM   GFR 50.09 (L) 12/21/2021 07:58 AM   GFR 53.89 (L) 12/16/2020 08:52 AM    Last diabetic Eye exam: No results found for: "HMDIABEYEEXA"  Last diabetic Foot exam: No results found for: "HMDIABFOOTEX"   Lab Results  Component Value Date   CHOL 204 (H) 12/21/2021   HDL 41.70 12/21/2021   LDLCALC 24 10/06/2019   LDLDIRECT 55.0 12/21/2021   TRIG (H) 12/21/2021    516.0 Triglyceride is over 400; calculations on Lipids are invalid.   CHOLHDL 5 12/21/2021       Latest Ref Rng & Units 12/21/2021    7:58 AM 10/30/2021   11:10 AM 12/31/2020   11:50 AM  Hepatic Function  Total Protein 6.0 - 8.3 g/dL 6.8  7.0  7.2   Albumin 3.5 - 5.2 g/dL 4.0  4.0  3.7   AST 0 - 37 U/L _0 ALT 0 - 53 U/L _1 Alk Phosphatase 39 - 117 U/L 67  56  50   Total Bilirubin 0.2 - 1.2 mg/dL 0.6  0.5  0.7     Lab Results  Component Value Date/Time   TSH 4.81 12/21/2021 07:58 AM   TSH 4.720 (H) 05/31/2021 12:11 PM   FREET4 0.74 12/21/2021 07:58 AM   FREET4 0.87 12/17/2018 11:01 AM       Latest Ref Rng & Units 12/21/2021    7:58 AM 10/31/2021    6:46 AM 10/30/2021   11:10 AM  CBC  WBC 4.0 - 10.5 K/uL 8.3  10.2  9.8   Hemoglobin 13.0 - 17.0 g/dL 12.0  11.0  12.1   Hematocrit 39.0 - 52.0 % 35.6  33.4  37.2   Platelets 150.0 - 400.0 K/uL 328.0  237  255     Lab Results  Component Value Date/Time   VD25OH 30.62 12/21/2021 07:58 AM   VD25OH 37.51 12/16/2020 08:52 AM    Clinical ASCVD: Yes  The ASCVD Risk score (Arnett DK, et  al., 2019) failed to calculate for the following reasons:   The 2019 ASCVD risk score is only valid for ages 62 to 67       12/28/2021   11:19 AM 12/15/2020    1:58 PM 10/30/2019    2:50 PM  Depression screen PHQ 2/9  Decreased Interest 3 0 0  Down, Depressed, Hopeless 0 0 0  PHQ - 2 Score 3 0 0  Altered sleeping  0 0  Tired, decreased energy 3 0 0  Change in appetite 0 0 0  Feeling bad or failure about yourself  0 0 0  Trouble concentrating 0 0 0  Moving slowly or fidgety/restless 0 0 0  Suicidal thoughts 0 0 0  PHQ-9 Score  0 0  Difficult doing work/chores Not difficult at all  Not difficult at all Not difficult at all     Social History   Tobacco Use  Smoking Status Never   Passive exposure: Never  Smokeless Tobacco Never   BP Readings from Last 3 Encounters:  02/15/22 (!) (P) 150/99  01/31/22 (!) 144/91  12/28/21 (!) 142/90   Pulse Readings from Last 3 Encounters:  02/15/22 86  01/31/22 98  12/28/21 91   Wt Readings from Last 3 Encounters:  02/15/22 270 lb (122.5 kg)  01/31/22 270 lb (122.5 kg)  12/28/21 275 lb 2 oz (124.8 kg)   BMI Readings from Last 3 Encounters:  02/15/22 36.62 kg/m  01/31/22 36.62 kg/m  12/28/21 37.31 kg/m    Assessment/Interventions: Review of patient past medical history, allergies, medications, health status, including review of consultants reports, laboratory and other test data, was performed as part of comprehensive evaluation and provision of chronic care management services.   SDOH:  (Social Determinants of Health) assessments and interventions performed: No  SDOH Screenings   Alcohol Screen: Low Risk  (12/15/2020)   Alcohol Screen    Last Alcohol Screening Score (AUDIT): 0  Depression (PHQ2-9): Medium Risk (12/28/2021)   Depression (PHQ2-9)    PHQ-2 Score: 6  Financial Resource Strain: Medium Risk (02/15/2021)   Overall Financial Resource Strain (CARDIA)    Difficulty of Paying Living Expenses: Somewhat hard  Food  Insecurity: No Food Insecurity (12/15/2020)   Hunger Vital Sign    Worried About Running Out of Food in the Last Year: Never true    [place] in the Last Year: Never true  Housing: Low Risk  (12/15/2020)   Housing    Last Housing Risk Score: 0  Physical Activity: Sufficiently Active (12/15/2020)   Exercise Vital Sign    Days of Exercise per Week: 7 days    Minutes of Exercise per Session: 60 min  Social Connections: Not on file  Stress: No Stress Concern Present (12/15/2020)   [place]    Feeling of Stress : Not at all  Tobacco Use: Low Risk  (02/15/2022)   Patient History    Smoking Tobacco Use: Never    Smokeless Tobacco Use: Never    Passive Exposure: Never  Transportation Needs: No Transportation Needs (12/15/2020)   PRAPARE - Transportation    Lack of Transportation (Medical): No    Lack of Transportation (Non-Medical): No    CCM Care Plan  Allergies  Allergen Reactions   Questran [Cholestyramine]     Patient not aware of an allergy to this medicine.   Atorvastatin     Muscle pain   Gemfibrozil Other (See Comments)    Muscle pain.   Metformin And Related     Dizziness   Rosuvastatin     Muscle pain    Medications Reviewed Today     Reviewed by Charlton Haws, [facility] (Pharmacist) on 03/27/22 at 1440  Med List Status: <None>   Medication Order Taking? Sig Documenting Provider Last Dose Status Informant  acetaminophen (TYLENOL) 500 MG tablet [id number] Yes Take 1 tablet (500 mg total) by mouth 2 (two) times daily as needed for moderate pain. Ria Bush, MD Taking Active Self  Alirocumab (PRALUENT) 75 MG/ML Darden Palmer [id number] Yes Inject 1 pen. into the skin every 14 (fourteen) days. Deboraha Sprang, MD Taking Active   amLODipine (NORVASC) 5 MG tablet [id number] Yes TAKE 1 TABLET(5 MG) BY MOUTH DAILY Ria Bush, MD Taking Active   aspirin EC 81  MG tablet [id number] Yes Take 1 tablet (81 mg  total) by mouth daily. Ria Bush, MD Taking Active Self  Cholecalciferol (VITAMIN D3) 1000 units CAPS [id number] Yes Take 1 capsule (1,000 Units total) by mouth daily. Ria Bush, MD Taking Active Self  ciclopirox (PENLAC) 8 % solution [id number] Yes APPLY TOPICALLY AT BEDTIME APPLY OVER NAIL AND SURROUNDING SKIN, APPLY DAILY OVER PREVIOUS COAT, AFTER 7 DAYS MAY REMOVE WITH ALCOHOL AND REPEAT Ralene Bathe, MD Taking Active   clopidogrel (PLAVIX) 75 MG tablet [id number] Yes TAKE 1 TABLET(75 MG) BY MOUTH DAILY AT 6 AM Kris Hartmann, NP Taking Active            Med Note Owens Shark, [place] A   Wed Feb 15, 2022  8:43 AM) Stop Plavix 7 days ago  diphenhydramine-acetaminophen (TYLENOL PM) 25-500 MG TABS tablet [id number] Yes Take 2 tablets by mouth at bedtime. [provider] Taking Active Self  docusate sodium (COLACE) 100 MG capsule [id number] Yes Take 100 mg by mouth daily. [provider] Taking Active Self  Eszopiclone 3 MG TABS [id number] Yes Take 1 tablet (3 mg total) by mouth at bedtime as needed. Use sparingly, take immediately before bedtime Ria Bush, MD Taking Active   ezetimibe (ZETIA) 10 MG tablet [id number] Yes Take 1 tablet (10 mg total) by mouth daily. Ria Bush, MD Taking Active   finasteride (PROSCAR) 5 MG tablet [id number] Yes TAKE 1 TABLET(5 MG) BY MOUTH DAILY Ria Bush, MD Taking Active   fluocinonide (LIDEX) 0.05 % external solution [id number] Yes Apply 1 application topically 2 (two) times daily as needed.  Patient taking differently: Apply 1 application  topically daily as needed (irritation).   Ralene Bathe, MD Taking Active   furosemide (LASIX) 40 MG tablet [id number] Yes TAKE 1 TABLET BY MOUTH EVERY DAY. TAKE AN EXTRA TABLET AS NEEDED FOR WEIGHT GAIN OF 3 LBS/DAY OR 5LBS/WEEK Ria Bush, MD Taking Active   gabapentin (NEURONTIN) 300 MG capsule [id number] Yes TAKE 2 CAPSULE BY MOUTH AT BEDTIME WITH EXTRA CAPSULE DURING  THE DAY AS NEEDED Ria Bush, MD Taking Active   icosapent Ethyl (VASCEPA) 1 g capsule [id number] Yes Take 2 capsules (2 g total) by mouth 2 (two) times daily. Deboraha Sprang, MD Taking Active   ketoconazole (NIZORAL) 2 % cream [id number] Yes APPLY TO FEET AND FACE AT BEDTIME AS DIRECTED Ralene Bathe, MD Taking Active   ketoconazole (NIZORAL) 2 % shampoo [id number] Yes APPLY TOPICALLY 3 TIMES WEEKLY AS DIRECTED Ralene Bathe, MD Taking Active   levothyroxine (SYNTHROID) 150 MCG tablet [id number] Yes TAKE 1 TABLET(150 MCG) BY MOUTH DAILY BEFORE Sander Radon, MD Taking Active   linaclotide [facility]) 290 MCG CAPS capsule [id number] Yes Take 1 capsule (290 mcg total) by mouth daily before breakfast. Ria Bush, MD Taking Active   lisinopril (ZESTRIL) 10 MG tablet [id number] Yes TAKE 1 TABLET(10 MG) BY MOUTH DAILY Ria Bush, MD Taking Active   Melatonin 10 MG CAPS [id number] Yes Take 10 mg by mouth at bedtime. [provider] Taking Active Self  meloxicam (MOBIC) 15 MG tablet [id number] Yes Take 1 tablet (15 mg total) by mouth daily. Ria Bush, MD Taking Active   mometasone (ELOCON) 0.1 % cream [id number] Yes APPLY TOPICALLY TO THE AFFECTED AREA DAILY AS NEEDED FOR Luevenia Maxin, MD Taking Active   mometasone (ELOCON) 0.1 % lotion [id number] Yes APPLY TO SCALP AT BEDTIME ON MONDAY, [facility], AND FRIDAY Ralene Bathe, MD Taking Active  Multiple Vitamins-Minerals (CENTRUM SILVER PO) [id number] Yes Take 1 tablet by mouth daily. [provider] Taking Active Self  Multiple Vitamins-Minerals (PRESERVISION AREDS 2 PO) [id number] Yes Take 1 tablet by mouth daily. [provider] Taking Active Self  naproxen sodium (ALEVE) 220 MG tablet [id number] Yes Take 220 mg by mouth daily as needed. [provider] Taking Active   nitroGLYCERIN (NITROSTAT) 0.4 MG SL tablet [id number] Yes Place 1 tablet (0.4 mg total) under the tongue  every 5 (five) minutes as needed for chest pain. Ria Bush, MD Taking Active            Med Note Vonna Drafts, [facility]   Fri Feb 06, 2020  2:06 PM)    ondansetron (ZOFRAN) 4 MG tablet [id number] Yes Take 1 tablet (4 mg total) by mouth every 6 (six) hours as needed for nausea. Lorella Nimrod, MD Taking Active   oxyCODONE-acetaminophen (PERCOCET) 7.5-325 MG tablet [id number] Yes Take 1 tablet by mouth 3 (three) times daily as needed for severe pain. Ria Bush, MD Taking Active   senna [facility]) 8.6 MG tablet [id number] Yes Take 1 tablet (8.6 mg total) by mouth daily. Ria Bush, MD Taking Active   tamsulosin [facility]) 0.4 MG CAPS capsule [id number] Yes Take 1 capsule (0.4 mg total) by mouth daily. Ria Bush, MD Taking Active   tiZANidine (ZANAFLEX) 2 MG tablet [id number] Yes TAKE 1 TO 2 TABLETS(2 TO 4 MG) BY MOUTH TWICE DAILY AS NEEDED FOR MUSCLE SPASMS Ria Bush, MD Taking Active   traZODone (DESYREL) 50 MG tablet [id number] Yes Take 1 tablet (50 mg total) by mouth at bedtime as needed for sleep. Ria Bush, MD Taking Active   Med List Note Dewayne Shorter, RN 02/06/22 1057): UDS 03/2017 Medication agreement signed 08-07-2017 MR 03-09-18 01-31-2022 Inbox message to Dr Gregor Hams to stop Plavix for 7 days. 02-06-2022             Patient Active Problem List   Diagnosis Date Noted   Lumbar facet arthropathy 01/31/2022   Lumbar degenerative disc disease 01/31/2022   Chronic pain syndrome 01/31/2022   Medicare annual wellness visit, subsequent 12/29/2021   Prediabetes 12/29/2021   Fatigue 12/31/2020   S/P endovascular aneurysm repair 04/2020   Eosinophilia 04/11/2020   Varicose veins of leg with pain, bilateral 04/06/2020   General unsteadiness 02/07/2020   Encounter for chronic pain management 07/04/2019   Essential tremor 05/07/2019   Cystic kidney disease 04/15/2019   Osteoarthritis 04/15/2019   Seasonal allergies 04/15/2019   Anosmia 02/17/2019    Advanced care planning/counseling discussion 11/05/2018   Vitamin D deficiency 10/24/2018   S/P insertion of spinal cord stimulator 09/09/2018   CKD (chronic kidney disease) stage 3, GFR 30-59 ml/min (HCC) 10/18/2017   Therapeutic opioid-induced constipation (OIC) 10/03/2017   Nasal congestion 06/26/2017   Chronic insomnia    Essential hypertension    Hypothyroidism    Severe obesity (BMI 35.0-39.9) with comorbidity ([place])    CAD (coronary artery disease)    Chronic lower back pain    History of atrial flutter 04/15/2017   History of basal cell carcinoma (BCC) 04/12/2017   History of squamous cell carcinoma in situ of skin 04/12/2017   Hypercholesterolemia with hypertriglyceridemia 04/12/2017   Chronic constipation 04/12/2017   OSA on CPAP 04/12/2017   BPH (benign prostatic hyperplasia) 98/92/1194   Diastolic heart failure ([place]) 02/18/2017   Abdominal aortic aneurysm (AAA) without rupture ([place]) 01/09/2016   Calculus of gallbladder without cholecystitis 01/09/2016   Diverticulitis of  sigmoid colon 01/09/2016   Spondylolisthesis of lumbar region 01/09/2016   Recurrent major depression in remission ([place]) 03/28/2013   CAD in native artery 07/11/2012   Esophageal reflux 09/21/2011   Left leg pain 09/21/2011   Spinal stenosis 04/20/2011    Immunization History  Administered Date(s) Administered   Fluad Quad(high Dose 65+) 05/07/2019, 06/30/2020   Hepatitis A, Adult 12/12/1996, 06/30/1997   Influenza Split 07/16/2006, 06/16/2008, 05/17/2009, 07/04/2010, 07/05/2011, 06/18/2012   Influenza, High Dose Seasonal PF 06/03/2015, 05/18/2016   Influenza,inj,Quad PF,6+ Mos 06/04/2013, 04/22/2014, 07/24/2017, 05/23/2018   PFIZER Comirnaty(Gray Top)Covid-19 Tri-Sucrose Vaccine 01/26/2021   PFIZER(Purple Top)SARS-COV-2 Vaccination 09/24/2019, 10/15/2019, 07/05/2020   Pneumococcal Conjugate-13 10/20/2013   Pneumococcal Polysaccharide-23 02/17/2011, 10/09/2011   Pneumococcal-Unspecified  08/28/2001   Td 08/28/2001, 10/09/2011   Zoster, Live 08/28/2013    Conditions to be addressed/monitored:  Hypertension, Hyperlipidemia, Heart Failure, and Coronary Artery Disease, chronic pain  Care Plan : CCM Pharmacy Care PLan  Updates made by Charlton Haws, [place] since 03/27/2022 12:00 AM     Problem: Hypertension, Hyperlipidemia, Heart Failure, and Coronary Artery Disease, chronic pain      Long-Range Goal: Disease Management   Start Date: 02/15/2021  Expected End Date: 03/28/2023  This Visit's Progress: On track  Priority: High  Note:   Current Barriers:  Suboptimal OTC pain reliever  Pharmacist Clinical Goal(s):  Patient will contact provider office for questions/concerns as evidenced notation of same in electronic health record through collaboration with PharmD and provider.   Interventions: 1:1 collaboration with Ria Bush, MD regarding development and update of comprehensive plan of care as evidenced by provider attestation and co-signature Inter-disciplinary care team collaboration (see longitudinal plan of care) Comprehensive medication review performed; medication list updated in electronic medical record  Hypertension / Heart Failure (BP goal <140/90) -Controlled - per clinic readings  -Last ejection fraction: 60% (Date: 01/2017) -HF type: Diastolic -Current treatment: Amlodipine 5 mg daily - Appropriate, Effective, Safe, Accessible Lisinopril 10 mg daily -Appropriate, Effective, Safe, Accessible Furosemide 40 mg daily + extra PRN wt gain -Appropriate, Effective, Safe, Accessible -Medications previously tried: none   -Educated on BP goals and benefits of medications for prevention of heart attack, stroke and kidney damage; -Counseled to monitor BP at home periodically -Recommended to continue current medication  Hyperlipidemia / CAD (LDL goal < 70) -Controlled - LDL 55 (11/2021) at goal; TRIG 516 above goal - per fill history he was likely not  taking ezetimibe at time of these labs, he has since restarted -Hx CAD, PCI 2013 -Current treatment: Praluent 75 mg/mL inject q2 weeks - Appropriate, Effective, Safe, Accessible Ezetimibe 10 mg daily - Appropriate, Effective, Safe, Accessible Vascepa 1 gm - 2 cap BID -Appropriate, Query Effective Clopidogrel 75 mg daily -Appropriate, Effective, Safe, Accessible Nitroglycerin 0.4 mg SL PRN -Appropriate, Effective, Safe, Accessible -Medications previously tried: none   -Educated on Cholesterol goals;  -Recommended to continue current medication  Chronic constipation (Goal: manage symptoms) -Controlled -Current treatment  Linzess 290 mcg daily - Appropriate, Effective, Safe, Accessible Docusate 100 mg daily-Appropriate, Effective, Safe, Accessible Senna 8.6 mg daily -Appropriate, Effective, Safe, Accessible -Medications previously tried: n/a  -Recommended to continue current medication  Chronic Pain -Controlled -Hx OA, spondyloisthesis, lumbar DDD -Current treatment: Tylenol 500 mg - Appropriate, Effective, Safe, Accessible Meloxicam 15 mg daily - Appropriate, Effective, Safe, Accessible Oxycocone-APAP 7.5-325 mg PRN -Appropriate, Effective, Safe, Accessible Gabapentin 300 mg BID -Appropriate, Effective, Safe, Accessible Icy Hot Aleve PRN - Appropriate, Effective, Query Safe -Medications previously tried: n/a  -  Reviewed Aleve + meloxicam interaction/duplicate therapy; discussed risk for kidney injury, GI bleeding with concurrent therapy; advised to limit Aleve use as much as possible, ideally stop using altogether  Topicals -Controlled - pt reports he rotates through several topicals -Current treatment  Ciclopirox 8%  soln Fluocinonide 0.05% soln Ketoconazole 2% cream Ketoconazole 2% shampoo Mometasone 0.1% cream Mometasone 0.1% lotion -Medications previously tried: n/a  -Recommended to continue current medication  Patient Goals/Self-Care Activities Patient will:  - take  medications as prescribed as evidenced by patient report and record review focus on medication adherence by routine Limit Aleve as much as possible      Medication Assistance: None required.  Patient affirms current coverage meets needs.  Compliance/Adherence/Medication fill history: Care Gaps: None  Star-Rating Drugs: Lisinopril - PDC 93%  Medication Access: Within the past 30 days, how often has patient missed a dose of medication? 0 Is a pillbox or other method used to improve adherence? Yes  Factors that may affect medication adherence? no barriers identified Are meds synced by current pharmacy? No  Are meds delivered by current pharmacy? No  Does patient experience delays in picking up medications due to transportation concerns? No   [facility] Services Reviewed: Is patient disadvantaged to use [facility]?: No  Current Rx insurance plan: [facility] PDP Name and location of Current pharmacy:  [facility] [place], [place] [address] [place] [address] Phone: [phone] Fax: [phone]  [facility] services reviewed with patient today?: No  Patient requests to transfer care to [facility]?: No  Reason patient declined to change pharmacies: Not mentioned at this visit   Care Plan and Follow Up Patient Decision:  Patient agrees to Care Plan and Follow-up.  Plan: Telephone follow up appointment with care management team member scheduled for:  6 months  Charlene Brooke, PharmD, BCACP Clinical Pharmacist [facility] at [facility] [phone]

## 2022-03-27 DIAGNOSIS — I5032 Chronic diastolic (congestive) heart failure: Secondary | ICD-10-CM | POA: Diagnosis not present

## 2022-03-27 DIAGNOSIS — N1831 Chronic kidney disease, stage 3a: Secondary | ICD-10-CM

## 2022-03-27 DIAGNOSIS — I2510 Atherosclerotic heart disease of native coronary artery without angina pectoris: Secondary | ICD-10-CM

## 2022-03-27 DIAGNOSIS — I10 Essential (primary) hypertension: Secondary | ICD-10-CM | POA: Diagnosis not present

## 2022-03-27 DIAGNOSIS — E782 Mixed hyperlipidemia: Secondary | ICD-10-CM | POA: Diagnosis not present

## 2022-03-27 NOTE — Patient Instructions (Signed)
Visit Information  Phone number for Pharmacist: [phone]   Goals Addressed   None     Care Plan : [place]  Updates made by Charlton Haws, RPH since 03/27/2022 12:00 AM     Problem: Hypertension, Hyperlipidemia, Heart Failure, and Coronary Artery Disease, chronic pain      Long-Range Goal: Disease Management   Start Date: 02/15/2021  Expected End Date: 03/28/2023  This Visit's Progress: On track  Priority: High  Note:   Current Barriers:  Suboptimal OTC pain reliever  Pharmacist Clinical Goal(s):  Patient will contact provider office for questions/concerns as evidenced notation of same in electronic health record through collaboration with PharmD and provider.   Interventions: 1:1 collaboration with Ria Bush, MD regarding development and update of comprehensive plan of care as evidenced by provider attestation and co-signature Inter-disciplinary care team collaboration (see longitudinal plan of care) Comprehensive medication review performed; medication list updated in electronic medical record  Hypertension / Heart Failure (BP goal <140/90) -Controlled - per clinic readings  -Last ejection fraction: 60% (Date: 01/2017) -HF type: Diastolic -Current treatment: Amlodipine 5 mg daily - Appropriate, Effective, Safe, Accessible Lisinopril 10 mg daily -Appropriate, Effective, Safe, Accessible Furosemide 40 mg daily + extra PRN wt gain -Appropriate, Effective, Safe, Accessible -Medications previously tried: none   -Educated on BP goals and benefits of medications for prevention of heart attack, stroke and kidney damage; -Counseled to monitor BP at home periodically -Recommended to continue current medication  Hyperlipidemia / CAD (LDL goal < 70) -Controlled - LDL 55 (11/2021) at goal; TRIG 516 above goal - per fill history he was likely not taking ezetimibe at time of these labs, he has since restarted -Hx CAD, PCI 2013 -Current  treatment: Praluent 75 mg/mL inject q2 weeks - Appropriate, Effective, Safe, Accessible Ezetimibe 10 mg daily - Appropriate, Effective, Safe, Accessible Vascepa 1 gm - 2 cap BID -Appropriate, Query Effective Clopidogrel 75 mg daily -Appropriate, Effective, Safe, Accessible Nitroglycerin 0.4 mg SL PRN -Appropriate, Effective, Safe, Accessible -Medications previously tried: none   -Educated on Cholesterol goals;  -Recommended to continue current medication  Chronic constipation (Goal: manage symptoms) -Controlled -Current treatment  Linzess 290 mcg daily - Appropriate, Effective, Safe, Accessible Docusate 100 mg daily-Appropriate, Effective, Safe, Accessible Senna 8.6 mg daily -Appropriate, Effective, Safe, Accessible -Medications previously tried: n/a  -Recommended to continue current medication  Chronic Pain -Controlled -Hx OA, spondyloisthesis, lumbar DDD -Current treatment: Tylenol 500 mg - Appropriate, Effective, Safe, Accessible Meloxicam 15 mg daily - Appropriate, Effective, Safe, Accessible Oxycocone-APAP 7.5-325 mg PRN -Appropriate, Effective, Safe, Accessible Gabapentin 300 mg BID -Appropriate, Effective, Safe, Accessible Icy Hot Aleve PRN - Appropriate, Effective, Query Safe -Medications previously tried: n/a  -Reviewed Aleve + meloxicam interaction/duplicate therapy; discussed risk for kidney injury, GI bleeding with concurrent therapy; advised to limit Aleve use as much as possible, ideally stop using altogether  Topicals -Controlled - pt reports he rotates through several topicals -Current treatment  Ciclopirox 8%  soln Fluocinonide 0.05% soln Ketoconazole 2% cream Ketoconazole 2% shampoo Mometasone 0.1% cream Mometasone 0.1% lotion -Medications previously tried: n/a  -Recommended to continue current medication  Patient Goals/Self-Care Activities Patient will:  - take medications as prescribed as evidenced by patient report and record review focus on  medication adherence by routine Limit Aleve as much as possible      Patient verbalizes understanding of instructions and care plan provided today and agrees to view in [place]. Active MyChart status and patient understanding of how to access  instructions and care plan via MyChart confirmed with patient.    Telephone follow up appointment with pharmacy team member scheduled for: 6 months  Charlene Brooke, PharmD, [facility] Clinical Pharmacist [facility] at [facility] [phone]

## 2022-03-28 ENCOUNTER — Encounter: Payer: Self-pay | Admitting: Student in an Organized Health Care Education/Training Program

## 2022-03-29 ENCOUNTER — Encounter: Payer: Self-pay | Admitting: Student in an Organized Health Care Education/Training Program

## 2022-03-29 ENCOUNTER — Other Ambulatory Visit: Payer: Self-pay | Admitting: Family Medicine

## 2022-03-29 ENCOUNTER — Ambulatory Visit
Payer: Medicare Other | Attending: Student in an Organized Health Care Education/Training Program | Admitting: Student in an Organized Health Care Education/Training Program

## 2022-03-29 DIAGNOSIS — M47816 Spondylosis without myelopathy or radiculopathy, lumbar region: Secondary | ICD-10-CM | POA: Diagnosis not present

## 2022-03-29 DIAGNOSIS — M5136 Other intervertebral disc degeneration, lumbar region: Secondary | ICD-10-CM | POA: Diagnosis not present

## 2022-03-29 NOTE — Progress Notes (Signed)
[name]  Service Category: E/M  Provider: Gillis Santa, MD  DOB: 1936/07/20  DOS: 03/29/2022  Location: Office  MRN: [id number]  Setting: Ambulatory outpatient  Referring Provider: Ria Bush, MD  Type: Established Patient  Specialty: Interventional Pain Management  PCP: Ria Bush, MD  Location: Remote location  Delivery: TeleHealth     Virtual Encounter - Pain Management PROVIDER NOTE: Information contained herein reflects review and annotations entered in association with encounter. Interpretation of such information and data should be left to medically-trained personnel. Information provided to patient can be located elsewhere in the medical record under "Patient Instructions". Document created using STT-dictation technology, any transcriptional errors that may result from process are unintentional.    Contact & Pharmacy Preferred: [phone] Home: [phone] (home) Mobile: [phone] (mobile) E-mail: [email or web address]  [id number] - [place], [place] [address] [place] [address] Phone: [phone] Fax: [phone]   Pre-screening  Mr. Velarde offered "in-person" vs "virtual" encounter. He indicated preferring virtual for this encounter.   Reason COVID-19*  Social distancing based on CDC and AMA recommendations.   I contacted [name] on 03/29/2022 via telephone.      I clearly identified myself as Gillis Santa, MD. I verified that I was speaking with the correct person using two identifiers (Name: FABIEN TRAVELSTEAD, and date of birth: May 05, 1936).  Consent I sought verbal advanced consent from [name] for virtual visit interactions. I informed Mr. Roig of possible security and privacy concerns, risks, and limitations associated with providing "not-in-person" medical evaluation and management services. I also informed Mr. Pipe of the  availability of "in-person" appointments. Finally, I informed him that there would be a charge for the virtual visit and that he could be  personally, fully or partially, financially responsible for it. Mr. Chavira expressed understanding and agreed to proceed.   Historic Elements   Mr. GARRON ELINE is a 86 y.o. year old, male patient evaluated today after our last contact on 02/15/2022. Mr. Shafer  has a past medical history of Anosmia (1980s), Atrial flutter ([place]) (2018), Basal cell carcinoma (01/11/2022), CAD (coronary artery disease), Cancer ([place]), Chronic constipation, Chronic insomnia, Chronic lower back pain, Complication of anesthesia, Diastolic CHF ([place]), History of diverticulitis (2017), History of pneumonia (2013), Hyperlipidemia, Hypertension, Hypothyroidism, Obesity, Class II, BMI 35-39.9, with comorbidity, OSA on CPAP, and PONV (postoperative nausea and vomiting). He also  has a past surgical history that includes Percutaneous coronary stent intervention (pci-s) (2013); Replacement total knee bilateral (Bilateral, 2000s); Spinal cord stimulator implant (06/2016); Nasal sinus surgery; A-FLUTTER ABLATION (N/A, 05/07/2017); Radiofrequency ablation (06/2017); Colonoscopy (2007); Cardiovascular stress test (11/2017); Elbow surgery (Right); Laminectomy thoracic spine w/ placement spinal cord stimulator (08/2018); Spinal cord stimulator insertion (N/A, 09/09/2018); Spinal cord stimulator insertion (N/A, 09/16/2018); and ENDOVASCULAR REPAIR/STENT GRAFT (N/A, 05/05/2020). Mr. Antkowiak has a current medication list which includes the following prescription(s): acetaminophen, praluent, amlodipine, aspirin ec, vitamin d3, ciclopirox, clopidogrel, diphenhydramine-acetaminophen, docusate sodium, eszopiclone, ezetimibe, fluocinonide, furosemide, gabapentin, icosapent ethyl, ketoconazole, ketoconazole, levothyroxine, linaclotide, lisinopril, melatonin, meloxicam, mometasone, mometasone, multiple vitamins-minerals,  multiple vitamins-minerals, naproxen sodium, nitroglycerin, ondansetron, oxycodone-acetaminophen, senna, tamsulosin, tizanidine, trazodone, and finasteride. He  reports that he has never smoked. He has never been exposed to tobacco smoke. He has never used smokeless tobacco. He reports that he does not drink alcohol and does not use drugs. Mr. Lederman is allergic to [place] [cholestyramine], atorvastatin, gemfibrozil, metformin and related, and  rosuvastatin.   HPI  Today, he is being contacted for a post-procedure assessment.   Post-procedure evaluation   Type: Therapeutic Medial Branch Facet Radiofrequency Ablation #2 (#1 09-19-2017) Region: Lumbar Level:  L3, L4, L5, Medial Branch Level(s) Laterality: Left-Sided  Effectiveness:  Initial hour after procedure: 100 %  Subsequent 4-6 hours post-procedure: 100 %  Analgesia past initial 6 hours: 50 % (current)  Ongoing improvement:  Analgesic:  50% Function: Mr. Cortopassi reports improvement in function ROM: Mr. Minerd reports improvement in ROM   Laboratory Chemistry Profile   Renal Lab Results  Component Value Date   BUN 25 (H) 12/21/2021   CREATININE 1.31 12/21/2021   BCR 16 05/03/2017   GFR 50.09 (L) 12/21/2021   GFRAA 45 (L) 05/06/2020   GFRNONAA >60 10/31/2021    Hepatic Lab Results  Component Value Date   AST 15 12/21/2021   ALT 13 12/21/2021   ALBUMIN 4.0 12/21/2021   ALKPHOS 67 12/21/2021   LIPASE 42 10/30/2021    Electrolytes Lab Results  Component Value Date   NA 139 12/21/2021   K 4.2 12/21/2021   CL 102 12/21/2021   CALCIUM 10.3 12/21/2021   PHOS 2.9 09/28/2020    Bone Lab Results  Component Value Date   VD25OH 30.62 12/21/2021   VD125OH2TOT 51 10/28/2018   [id number] 51 10/28/2018   [id number] <8 10/28/2018    Inflammation (CRP: Acute Phase) (ESR: Chronic Phase) Lab Results  Component Value Date   LATICACIDVEN 1.5 10/30/2021         Note: Above Lab results reviewed.   Assessment  The primary  encounter diagnosis was Lumbar spondylosis. Diagnoses of Lumbar facet arthropathy and Lumbar degenerative disc disease were also pertinent to this visit.  Plan of [place] is status post left L3, L4, L5 RFA #2.  His initial 1 was done in 2019.  He is endorsing analgesic and functional benefit after his lumbar radiofrequency ablation.  He endorses pain relief at approximately 50%.  He is wondering if he can repeat the procedure.  I told him that insurance will not pay for this procedure if he does it earlier than 6 months.  He states that he is willing to pay for it out-of-pocket but would like to consider having another ablation.  I also offered him peripheral nerve stimulation, he does have a spinal cord stimulator in place.  He states that he will reach out to [place] to see what his out-of-pocket expense will be if he decided to pursue the lumbar RFA on his own.     Follow-up plan:   Return for PRN.    Recent Visits Date Type Provider Dept  02/15/22 Procedure visit Gillis Santa, MD [facility]  01/31/22 Office Visit Gillis Santa, MD [facility]  Showing recent visits within past 90 days and meeting all other requirements Today's Visits Date Type Provider Dept  03/29/22 Office Visit Gillis Santa, MD [facility]  Showing today's visits and meeting all other requirements Future Appointments No visits were found meeting these conditions. Showing future appointments within next 90 days and meeting all other requirements  I discussed the assessment and treatment plan with the patient. The patient was provided an opportunity to ask questions and all were answered. The patient agreed with the plan and demonstrated an understanding of the instructions.  Patient advised to call back or seek an in-person evaluation if the symptoms or condition worsens.  Duration of encounter: 30 minutes.  Note by: Carlus Pavlov  Holley Raring, MD Date: 03/29/2022; Time: 4:18 PM

## 2022-04-10 ENCOUNTER — Ambulatory Visit
Payer: Medicare Other | Attending: Student in an Organized Health Care Education/Training Program | Admitting: Student in an Organized Health Care Education/Training Program

## 2022-04-10 ENCOUNTER — Encounter: Payer: Self-pay | Admitting: Student in an Organized Health Care Education/Training Program

## 2022-04-10 ENCOUNTER — Ambulatory Visit
Admission: RE | Admit: 2022-04-10 | Discharge: 2022-04-10 | Disposition: A | Discharge status: Home / Self Care | Payer: Medicare Other | Source: Ambulatory Visit | Attending: Student in an Organized Health Care Education/Training Program | Admitting: Student in an Organized Health Care Education/Training Program

## 2022-04-10 ENCOUNTER — Other Ambulatory Visit: Payer: Self-pay | Admitting: Family Medicine

## 2022-04-10 VITALS — BP 125/91 | HR 115 | Temp 97.1°F | Resp 22 | Ht 72.0 in | Wt 260.0 lb

## 2022-04-10 DIAGNOSIS — G894 Chronic pain syndrome: Secondary | ICD-10-CM | POA: Insufficient documentation

## 2022-04-10 DIAGNOSIS — I10 Essential (primary) hypertension: Secondary | ICD-10-CM

## 2022-04-10 DIAGNOSIS — M47816 Spondylosis without myelopathy or radiculopathy, lumbar region: Secondary | ICD-10-CM | POA: Diagnosis not present

## 2022-04-10 MED ORDER — DEXAMETHASONE SODIUM PHOSPHATE 10 MG/ML IJ SOLN
INTRAMUSCULAR | Status: AC
  Filled 2022-04-10: qty 1

## 2022-04-10 MED ORDER — ROPIVACAINE HCL 2 MG/ML IJ SOLN
INTRAMUSCULAR | Status: AC
  Filled 2022-04-10: qty 20

## 2022-04-10 MED ORDER — DEXAMETHASONE SODIUM PHOSPHATE 10 MG/ML IJ SOLN
10.0000 mg | Freq: Once | INTRAMUSCULAR | Status: AC
  Administered 2022-04-10: 10 mg

## 2022-04-10 MED ORDER — LIDOCAINE HCL 2 % IJ SOLN
INTRAMUSCULAR | Status: AC
  Filled 2022-04-10: qty 20

## 2022-04-10 MED ORDER — ROPIVACAINE HCL 2 MG/ML IJ SOLN
9.0000 mL | Freq: Once | INTRAMUSCULAR | Status: AC
  Administered 2022-04-10: 9 mL via PERINEURAL

## 2022-04-10 MED ORDER — LIDOCAINE HCL 2 % IJ SOLN
20.0000 mL | Freq: Once | INTRAMUSCULAR | Status: AC
  Administered 2022-04-10: 400 mg

## 2022-04-10 NOTE — Progress Notes (Signed)
Patient's Name: Anthony [name]  MRN: [id number]  Referring Provider: Ria Bush, MD  DOB: June 26, 1936  PCP: Ria Bush, MD  DOS: 04/10/2022  Note by: Gillis Santa, MD  Service setting: Ambulatory outpatient  Specialty: Interventional Pain Management  Patient type: Established  Location: [facility] (AMB) Pain Management Facility  Visit type: Interventional Procedure   Primary Reason for Visit: Interventional Pain Management Treatment. CC: Leg Pain (bilateral) and Back Pain (low)  Procedure:  Anesthesia, Analgesia, Anxiolysis:  Type: Therapeutic Medial Branch Facet Block # 3 Region: Lumbar Level: L3, L4, L5, Medial Branch Level(s) Laterality: Left  Type: Local Anesthesia Local Anesthetic: Lidocaine 1% Route: Infiltration (Big Arm/IM) IV Access: Declined Sedation: Meaningful verbal contact was maintained at all times during the procedure  Indication(s): Analgesia and Anxiety   Indications: 1. Lumbar spondylosis   2. Lumbar facet arthropathy   3. Chronic pain syndrome    Pain Score: Pre-procedure: 10-Worst pain ever/10 Post-procedure: 0-No pain/10  Pre-op Assessment:  Anthony [name] is a 86 y.o. (year old), male patient, seen today for interventional treatment. He  has a past surgical history that includes Percutaneous coronary stent intervention (pci-s) (2013); Replacement total knee bilateral (Bilateral, 2000s); Spinal cord stimulator implant (06/2016); Nasal sinus surgery; A-FLUTTER ABLATION (N/A, 05/07/2017); Radiofrequency ablation (06/2017); Colonoscopy (2007); Cardiovascular stress test (11/2017); Elbow surgery (Right); Laminectomy thoracic spine w/ placement spinal cord stimulator (08/2018); Spinal cord stimulator insertion (N/A, 09/09/2018); Spinal cord stimulator insertion (N/A, 09/16/2018); and ENDOVASCULAR REPAIR/STENT GRAFT (N/A, 05/05/2020). Anthony [name] has a current medication list which includes the following prescription(s): acetaminophen, praluent, amlodipine, aspirin ec, vitamin  d3, ciclopirox, clopidogrel, diphenhydramine-acetaminophen, docusate sodium, eszopiclone, ezetimibe, finasteride, fluocinonide, furosemide, gabapentin, icosapent ethyl, ketoconazole, ketoconazole, levothyroxine, linaclotide, lisinopril, melatonin, meloxicam, mometasone, mometasone, multiple vitamins-minerals, multiple vitamins-minerals, naproxen sodium, nitroglycerin, ondansetron, oxycodone-acetaminophen, senna, tamsulosin, tizanidine, and trazodone. His primarily concern today is the Leg Pain (bilateral) and Back Pain (low)  Patient stopped Eliquis 5 days, will restart tomorrow.  Initial Vital Signs: There were no vitals taken for this visit. BMI: Estimated body mass index is 35.26 kg/m as calculated from the following:   Height as of this encounter: 6' (1.829 m).   Weight as of this encounter: 260 lb (117.9 kg).  Risk Assessment: Allergies: Reviewed. He is allergic to [place] [cholestyramine], atorvastatin, gemfibrozil, metformin and related, and rosuvastatin.  Allergy Precautions: None required Coagulopathies: Reviewed. None identified.  Blood-thinner therapy: None at this time Active Infection(s): Reviewed. None identified. Anthony [name] is afebrile  Site Confirmation: Anthony [name] was asked to confirm the procedure and laterality before marking the site Procedure checklist: Completed Consent: Before the procedure and under the influence of no sedative(s), amnesic(s), or anxiolytics, the patient was informed of the treatment options, risks and possible complications. To fulfill our ethical and legal obligations, as recommended by the American Medical Association's Code of Ethics, I have informed the patient of my clinical impression; the nature and purpose of the treatment or procedure; the risks, benefits, and possible complications of the intervention; the alternatives, including doing nothing; the risk(s) and benefit(s) of the alternative treatment(s) or procedure(s); and the risk(s) and  benefit(s) of doing nothing. The patient was provided information about the general risks and possible complications associated with the procedure. These may include, but are not limited to: failure to achieve desired goals, infection, bleeding, organ or nerve damage, allergic reactions, paralysis, and death. In addition, the patient was informed of those risks and complications associated to Spine-related procedures, such as failure to decrease pain; infection (i.e.: Meningitis, epidural or  intraspinal abscess); bleeding (i.e.: epidural hematoma, subarachnoid hemorrhage, or any other type of intraspinal or peri-dural bleeding); organ or nerve damage (i.e.: Any type of peripheral nerve, nerve root, or spinal cord injury) with subsequent damage to sensory, motor, and/or autonomic systems, resulting in permanent pain, numbness, and/or weakness of one or several areas of the body; allergic reactions; (i.e.: anaphylactic reaction); and/or death. Furthermore, the patient was informed of those risks and complications associated with the medications. These include, but are not limited to: allergic reactions (i.e.: anaphylactic or anaphylactoid reaction(s)); adrenal axis suppression; blood sugar elevation that in diabetics may result in ketoacidosis or comma; water retention that in patients with history of congestive heart failure may result in shortness of breath, pulmonary edema, and decompensation with resultant heart failure; weight gain; swelling or edema; medication-induced neural toxicity; particulate matter embolism and blood vessel occlusion with resultant organ, and/or nervous system infarction; and/or aseptic necrosis of one or more joints. Finally, the patient was informed that Medicine is not an exact science; therefore, there is also the possibility of unforeseen or unpredictable risks and/or possible complications that may result in a catastrophic outcome. The patient indicated having understood very  clearly. We have given the patient no guarantees and we have made no promises. Enough time was given to the patient to ask questions, all of which were answered to the patient's satisfaction. Mr. Mcfadyen has indicated that he wanted to continue with the procedure. Attestation: I, the ordering provider, attest that I have discussed with the patient the benefits, risks, side-effects, alternatives, likelihood of achieving goals, and potential problems during recovery for the procedure that I have provided informed consent. Date: 04/10/2022; Time: 12:32 PM  Pre-Procedure Preparation:  Monitoring: As per clinic protocol. Respiration, ETCO2, SpO2, BP, heart rate and rhythm monitor placed and checked for adequate function Safety Precautions: Patient was assessed for positional comfort and pressure points before starting the procedure. Time-out: I initiated and conducted the "Time-out" before starting the procedure, as per protocol. The patient was asked to participate by confirming the accuracy of the "Time Out" information. Verification of the correct person, site, and procedure were performed and confirmed by me, the nursing staff, and the patient. "Time-out" conducted as per Joint Commission's Universal Protocol (UP.01.01.01). "Time-out" Date & Time: 04/10/2022; 1145 hrs.  Description of Procedure Process:   Position: Prone Target Area: For Lumbar Facet blocks, the target is the groove formed by the junction of the transverse process and superior articular process. For the L5 dorsal ramus, the target is the notch between superior articular process and sacral ala.  Approach: Paramedial approach. Area Prepped: Entire Posterior Lumbosacral Region Prepping solution: ChloraPrep (2% chlorhexidine gluconate and 70% isopropyl alcohol) Safety Precautions: Aspiration looking for blood return was conducted prior to all injections. At no point did we inject any substances, as a needle was being advanced. No attempts  were made at seeking any paresthesias. Safe injection practices and needle disposal techniques used. Medications properly checked for expiration dates. SDV (single dose vial) medications used. Description of the Procedure: Protocol guidelines were followed. The patient was placed in position over the fluoroscopy table. The target area was identified and the area prepped in the usual manner. Skin desensitized using vapocoolant spray. Skin & deeper tissues infiltrated with local anesthetic. Appropriate amount of time allowed to pass for local anesthetics to take effect. The procedure needle was introduced through the skin, ipsilateral to the reported pain, and advanced to the target area. Employing the "Medial Branch Technique", the needles  were advanced to the angle made by the superior and medial portion of the transverse process, and the lateral and inferior portion of the superior articulating process of the targeted vertebral bodies. This area is known as "Bur[place]'s Eye" or the "Eye of the [place] Dog". A procedure needle was introduced through the skin, and this time advanced to the angle made by the superior and medial border of the sacral ala, and the lateral border of the S1 vertebral body.  Negative aspiration confirmed. Solution injected in intermittent fashion, asking for systemic symptoms every 0.5cc of injectate. The needles were then removed and the area cleansed, making sure to leave some of the prepping solution back to take advantage of its long term bactericidal properties.   Illustration of the posterior view of the lumbar spine and the posterior neural structures. Laminae of L2 through S1 are labeled. DPRL5, dorsal primary ramus of L5; DPRS1, dorsal primary ramus of S1; DPR3, dorsal primary ramus of L3; FJ, facet (zygapophyseal) joint L3-L4; I, inferior articular process of L4; LB1, lateral branch of dorsal primary ramus of L1; IAB, inferior articular branches from L3 medial branch (supplies  L4-L5 facet joint); IBP, intermediate branch plexus; MB3, medial branch of dorsal primary ramus of L3; NR3, third lumbar nerve root; S, superior articular process of L5; SAB, superior articular branches from L4 (supplies L4-5 facet joint also); TP3, transverse process of L3.  Vitals:   04/10/22 1112 04/10/22 1142 04/10/22 1147 04/10/22 1150  BP: (!) 128/94 (!) 146/105 (!) 132/92 (!) 125/91  Pulse: (!) 115     Resp: 18 (!) 22 (!) 22 (!) 22  Temp: (!) 97.1 F (36.2 C)     SpO2: 100% 92% 91% 92%  Weight: 260 lb (117.9 kg)     Height: 6' (1.829 m)       Start Time: 1145 hrs. End Time: 1150 hrs. Materials:  Needle(s) Type: Regular needle Gauge: 22G Length: 3.5-in Medication(s): We administered lidocaine, dexamethasone, and ropivacaine (PF) 2 mg/mL (0.2%). Please see chart orders for dosing details.    6 cc solution made of 5cc of 0.2% ropivacaine, 1 cc of Decadron 10 mg/cc.  2 cc injected at each level above on the left.    Imaging Guidance (Spinal):  Type of Imaging Technique: Fluoroscopy Guidance (Spinal) Indication(s): Assistance in needle guidance and placement for procedures requiring needle placement in or near specific anatomical locations not easily accessible without such assistance. Exposure Time: Please see nurses notes. Contrast: None used. Fluoroscopic Guidance: I was personally present during the use of fluoroscopy. "Tunnel Vision Technique" used to obtain the best possible view of the target area. Parallax error corrected before commencing the procedure. "Direction-depth-direction" technique used to introduce the needle under continuous pulsed fluoroscopy. Once target was reached, antero-posterior, oblique, and lateral fluoroscopic projection used confirm needle placement in all planes. Images permanently stored in EMR. Interpretation: No contrast injected. I personally interpreted the imaging intraoperatively. Adequate needle placement confirmed in multiple planes.  Permanent images saved into the patient's record.  Antibiotic Prophylaxis:  Indication(s): None identified Antibiotic given: None  Post-operative Assessment:  EBL: None Complications: No immediate post-treatment complications observed by team, or reported by patient. Note: The patient tolerated the entire procedure well. A repeat set of vitals were taken after the procedure and the patient was kept under observation following institutional policy, for this type of procedure. Post-procedural neurological assessment was performed, showing return to baseline, prior to discharge. The patient was provided with post-procedure discharge instructions, including a section  on how to identify potential problems. Should any problems arise concerning this procedure, the patient was given instructions to immediately contact us, at any time, without hesitation. In any case, we plan to contact the patient by telephone for a follow-up status report regarding this interventional procedure. Comments:  No additional relevant information. 5 out of 5 strength bilateral lower extremity: Plantar flexion, dorsiflexion, knee flexion, knee extension.  Plan of Care    Imaging Orders         DG PAIN CLINIC C-ARM 1-60 MIN NO REPORT    Procedure Orders    No procedure(s) ordered today    Medications ordered for procedure: Meds ordered this encounter  Medications   lidocaine (XYLOCAINE) 2 % (with pres) injection 400 mg   dexamethasone (DECADRON) injection 10 mg   ropivacaine (PF) 2 mg/mL (0.2%) (NAROPIN) injection 9 mL   Medications administered: We administered lidocaine, dexamethasone, and ropivacaine (PF) 2 mg/mL (0.2%).  See the medical record for exact dosing, route, and time of administration.  New Prescriptions   No medications on file   Disposition: Discharge home  Discharge Date & Time: 04/10/2022; 1153 hrs.   Physician-requested Follow-up: Return in about 4 weeks (around 05/08/2022) for Post Procedure  Evaluation, in person. Future Appointments  Date Time Provider [place]  04/25/2022  8:30 AM [facility]  04/25/2022 11:30 AM Ralene Bathe, MD [facility] None  05/02/2022 10:30 AM Ria Bush, MD [facility]  05/08/2022  2:20 PM Gillis Santa, MD [facility] None  06/29/2022 10:20 AM Deboraha Sprang, MD [facility]  07/06/2022  3:45 PM Ralene Bathe, MD [facility] None  09/25/2022  3:00 PM [facility] PHARMACIST [facility]  12/18/2022  8:30 AM [facility] 1 [facility] None  12/18/2022  9:30 AM Kris Hartmann, NP [facility] None   Primary Care Physician: Ria Bush, MD Location: [facility] Note by: Gillis Santa, MD Date: 04/10/2022; Time: 2:20 PM  Disclaimer:  Medicine is not an exact science. The only guarantee in medicine is that nothing is guaranteed. It is important to note that the decision to proceed with this intervention was based on the information collected from the patient. The Data and conclusions were drawn from the patient's questionnaire, the interview, and the physical examination. Because the information was provided in large part by the patient, it cannot be guaranteed that it has not been purposely or unconsciously manipulated. Every effort has been made to obtain as much relevant data as possible for this evaluation. It is important to note that the conclusions that lead to this procedure are derived in large part from the available data. Always take into account that the treatment will also be dependent on availability of resources and existing treatment guidelines, considered by other Pain Management Practitioners as being common knowledge and practice, at the time of the intervention. For Medico-Legal purposes, it is also important to point out that variation in procedural techniques and pharmacological choices are the acceptable norm. The indications, contraindications, technique, and results of the above  procedure should only be interpreted and judged by a Board-Certified Interventional Pain Specialist with extensive familiarity and expertise in the same exact procedure and technique.

## 2022-04-10 NOTE — Patient Instructions (Signed)

## 2022-04-10 NOTE — Progress Notes (Signed)
Safety precautions to be maintained throughout the outpatient stay will include: orient to surroundings, keep bed in low position, maintain call bell within reach at all times, provide assistance with transfer out of bed and ambulation.  

## 2022-04-11 ENCOUNTER — Telehealth: Payer: Self-pay

## 2022-04-11 NOTE — Telephone Encounter (Signed)
Post procedure phone call. Patient states she is doing good.  

## 2022-04-15 ENCOUNTER — Other Ambulatory Visit: Payer: Self-pay | Admitting: Family Medicine

## 2022-04-17 NOTE — Telephone Encounter (Signed)
Linzess last filled:  01/30/22, #90 Gabapentin last filled:  01/16/22, #200 Last OV:  12/28/21, AWV Next OV:  05/02/22, 4 mo f/u

## 2022-04-18 ENCOUNTER — Telehealth: Payer: Self-pay | Admitting: Student in an Organized Health Care Education/Training Program

## 2022-04-18 NOTE — Telephone Encounter (Signed)
Patient notified

## 2022-04-18 NOTE — Telephone Encounter (Signed)
Left L3-4-5  block was done on 04-10-22. Will you order another one?

## 2022-04-18 NOTE — Telephone Encounter (Signed)
Patient wants to know if he can go ahead and schedule another procedure like the last one. Says the last one is 50% better but he wants another one. Doesn't want to wait until after 9-11 follow up appt.

## 2022-04-22 ENCOUNTER — Other Ambulatory Visit: Payer: Self-pay | Admitting: Family Medicine

## 2022-04-22 DIAGNOSIS — E039 Hypothyroidism, unspecified: Secondary | ICD-10-CM

## 2022-04-22 DIAGNOSIS — N1831 Chronic kidney disease, stage 3a: Secondary | ICD-10-CM

## 2022-04-22 DIAGNOSIS — E782 Mixed hyperlipidemia: Secondary | ICD-10-CM

## 2022-04-22 DIAGNOSIS — E559 Vitamin D deficiency, unspecified: Secondary | ICD-10-CM

## 2022-04-22 DIAGNOSIS — R7303 Prediabetes: Secondary | ICD-10-CM

## 2022-04-25 ENCOUNTER — Encounter: Payer: Self-pay | Admitting: Dermatology

## 2022-04-25 ENCOUNTER — Other Ambulatory Visit (INDEPENDENT_AMBULATORY_CARE_PROVIDER_SITE_OTHER): Payer: Medicare Other

## 2022-04-25 ENCOUNTER — Telehealth: Payer: Self-pay

## 2022-04-25 ENCOUNTER — Ambulatory Visit (INDEPENDENT_AMBULATORY_CARE_PROVIDER_SITE_OTHER): Payer: Medicare Other | Admitting: Dermatology

## 2022-04-25 ENCOUNTER — Other Ambulatory Visit: Payer: Medicare Other

## 2022-04-25 DIAGNOSIS — R7303 Prediabetes: Secondary | ICD-10-CM | POA: Diagnosis not present

## 2022-04-25 DIAGNOSIS — E782 Mixed hyperlipidemia: Secondary | ICD-10-CM

## 2022-04-25 DIAGNOSIS — N1831 Chronic kidney disease, stage 3a: Secondary | ICD-10-CM | POA: Diagnosis not present

## 2022-04-25 DIAGNOSIS — C44319 Basal cell carcinoma of skin of other parts of face: Secondary | ICD-10-CM | POA: Diagnosis not present

## 2022-04-25 DIAGNOSIS — E559 Vitamin D deficiency, unspecified: Secondary | ICD-10-CM | POA: Diagnosis not present

## 2022-04-25 DIAGNOSIS — H2513 Age-related nuclear cataract, bilateral: Secondary | ICD-10-CM | POA: Diagnosis not present

## 2022-04-25 DIAGNOSIS — E039 Hypothyroidism, unspecified: Secondary | ICD-10-CM | POA: Diagnosis not present

## 2022-04-25 LAB — LIPID PANEL
Cholesterol: 161 mg/dL (ref 0–200)
HDL: 45.3 mg/dL (ref >39.00)
NonHDL: 116.07
Total CHOL/HDL Ratio: 4
Triglycerides: 332 mg/dL — ABNORMAL HIGH (ref 0.0–149.0)
VLDL: 66.4 mg/dL — ABNORMAL HIGH (ref 0.0–40.0)

## 2022-04-25 LAB — COMPREHENSIVE METABOLIC PANEL
ALT: 13 U/L (ref 0–53)
AST: 16 U/L (ref 0–37)
Albumin: 4.2 g/dL (ref 3.5–5.2)
Alkaline Phosphatase: 84 U/L (ref 39–117)
BUN: 23 mg/dL (ref 6–23)
CO2: 29 mEq/L (ref 19–32)
Calcium: 10.5 mg/dL (ref 8.4–10.5)
Chloride: 103 mEq/L (ref 96–112)
Creatinine, Ser: 1.34 mg/dL (ref 0.40–1.50)
GFR: 48.63 mL/min — ABNORMAL LOW (ref >60.00)
Glucose, Bld: 106 mg/dL — ABNORMAL HIGH (ref 70–99)
Potassium: 4.7 mEq/L (ref 3.5–5.1)
Sodium: 140 mEq/L (ref 135–145)
Total Bilirubin: 0.5 mg/dL (ref 0.2–1.2)
Total Protein: 7.3 g/dL (ref 6.0–8.3)

## 2022-04-25 LAB — CBC WITH DIFFERENTIAL/PLATELET
Basophils Absolute: 0.3 10*3/uL — ABNORMAL HIGH (ref 0.0–0.1)
Basophils Relative: 3.2 % — ABNORMAL HIGH (ref 0.0–3.0)
Eosinophils Absolute: 1 10*3/uL — ABNORMAL HIGH (ref 0.0–0.7)
Eosinophils Relative: 11.6 % — ABNORMAL HIGH (ref 0.0–5.0)
HCT: 38.1 % — ABNORMAL LOW (ref 39.0–52.0)
Hemoglobin: 12.8 g/dL — ABNORMAL LOW (ref 13.0–17.0)
Lymphocytes Relative: 30.6 % (ref 12.0–46.0)
Lymphs Abs: 2.5 10*3/uL (ref 0.7–4.0)
MCHC: 33.6 g/dL (ref 30.0–36.0)
MCV: 95.6 fl (ref 78.0–100.0)
Monocytes Absolute: 0.8 10*3/uL (ref 0.1–1.0)
Monocytes Relative: 9.5 % (ref 3.0–12.0)
Neutro Abs: 3.7 10*3/uL (ref 1.4–7.7)
Neutrophils Relative %: 45.1 % (ref 43.0–77.0)
Platelets: 288 10*3/uL (ref 150.0–400.0)
RBC: 3.99 Mil/uL — ABNORMAL LOW (ref 4.22–5.81)
RDW: 14.8 % (ref 11.5–15.5)
WBC: 8.2 10*3/uL (ref 4.0–10.5)

## 2022-04-25 LAB — VITAMIN D 25 HYDROXY (VIT D DEFICIENCY, FRACTURES): VITD: 33.91 ng/mL (ref 30.00–100.00)

## 2022-04-25 LAB — TSH: TSH: 7.36 u[IU]/mL — ABNORMAL HIGH (ref 0.35–5.50)

## 2022-04-25 LAB — LDL CHOLESTEROL, DIRECT: Direct LDL: 47 mg/dL

## 2022-04-25 LAB — HEMOGLOBIN A1C: Hgb A1c MFr Bld: 6.3 % (ref 4.6–6.5)

## 2022-04-25 MED ORDER — MUPIROCIN 2 % EX OINT: 1.0000 | TOPICAL_OINTMENT | Freq: Every day | CUTANEOUS | 0 refills | Status: DC

## 2022-04-25 NOTE — Progress Notes (Unsigned)
   Follow-Up Visit   Subjective  Anthony [name] is a 86 y.o. male who presents for the following: Basal Cell Carcinoma (Biopsy proven BCC of left cheek - excise today).  The following portions of the chart were reviewed this encounter and updated as appropriate:   Tobacco  Allergies  Meds  Problems  Med Hx  Surg Hx  Fam Hx     Review of Systems:  No other skin or systemic complaints except as noted in HPI or Assessment and Plan.  Objective  Well appearing patient in no apparent distress; mood and affect are within normal limits.  A focused examination was performed including face. Relevant physical exam findings are noted in the Assessment and Plan.  Left cheek Healing biopsy site   Assessment & Plan  Basal cell carcinoma (BCC) of skin of other part of face Left cheek  Skin excision  Lesion length (cm):  1.7 Lesion width (cm):  1.2 Margin per side (cm):  0.2 Total excision diameter (cm):  2.1 Informed consent: discussed and consent obtained   Timeout: patient name, date of birth, surgical site, and procedure verified   Procedure prep:  Patient was prepped and draped in usual sterile fashion Prep type:  Isopropyl alcohol and povidone-iodine Anesthesia: the lesion was anesthetized in a standard fashion   Anesthetic:  1% lidocaine w/ epinephrine 1-100,000 buffered w/ 8.4% NaHCO3 Instrument used comment:  15C Hemostasis achieved with: pressure   Hemostasis achieved with comment:  Electrocautery Outcome: patient tolerated procedure well with no complications   Post-procedure details: sterile dressing applied and wound care instructions given   Dressing type: bandage and pressure dressing (mupirocin)    Skin repair Complexity:  Complex Final length (cm):  5 Reason for type of repair: reduce tension to allow closure, reduce the risk of dehiscence, infection, and necrosis, reduce subcutaneous dead space and avoid a hematoma, allow closure of the large defect, preserve  normal anatomy, preserve normal anatomical and functional relationships and enhance both functionality and cosmetic results   Undermining: area extensively undermined   Undermining comment:  Undermining defect 1.6 Subcutaneous layers (deep stitches):  Suture size:  4-0 Suture type: Vicryl (polyglactin 910)   Subcutaneous suture technique: inverted dermal. Fine/surface layer approximation (top stitches):  Suture size:  4-0 Suture type: nylon   Stitches: simple running   Suture removal (days):  7 Hemostasis achieved with: suture and pressure Outcome: patient tolerated procedure well with no complications   Post-procedure details: sterile dressing applied and wound care instructions given   Dressing type: bandage and pressure dressing (mupirocin)    mupirocin ointment (BACTROBAN) 2 % Apply 1 Application topically daily. With dressing changes  Specimen 1 - Surgical pathology Differential Diagnosis: Biopsy proven BCC  Check Margins: Yes [phone]  Start Mupirocin oint qd to excision site   Return in about 1 week (around 05/02/2022) for suture removal.  I, Ashok Cordia, CMA, am acting as scribe for Sarina Ser, MD . Documentation: I have reviewed the above documentation for accuracy and completeness, and I agree with the above.  Sarina Ser, MD

## 2022-04-25 NOTE — Telephone Encounter (Signed)
Left pt msg to call if any problems after today's surgery./sh °

## 2022-04-25 NOTE — Patient Instructions (Signed)

## 2022-04-26 ENCOUNTER — Encounter: Payer: Self-pay | Admitting: Dermatology

## 2022-04-28 LAB — PARATHYROID HORMONE, INTACT (NO CA)

## 2022-04-28 LAB — EXTRA SPECIMEN

## 2022-05-02 ENCOUNTER — Ambulatory Visit (INDEPENDENT_AMBULATORY_CARE_PROVIDER_SITE_OTHER): Payer: Medicare Other | Admitting: Dermatology

## 2022-05-02 ENCOUNTER — Encounter: Payer: Self-pay | Admitting: Family Medicine

## 2022-05-02 ENCOUNTER — Ambulatory Visit (INDEPENDENT_AMBULATORY_CARE_PROVIDER_SITE_OTHER): Payer: Medicare Other | Admitting: Family Medicine

## 2022-05-02 VITALS — BP 124/82 | HR 100 | Temp 97.6°F | Ht 72.0 in | Wt 275.4 lb

## 2022-05-02 DIAGNOSIS — E782 Mixed hyperlipidemia: Secondary | ICD-10-CM | POA: Diagnosis not present

## 2022-05-02 DIAGNOSIS — E039 Hypothyroidism, unspecified: Secondary | ICD-10-CM

## 2022-05-02 DIAGNOSIS — Z9989 Dependence on other enabling machines and devices: Secondary | ICD-10-CM | POA: Diagnosis not present

## 2022-05-02 DIAGNOSIS — G4733 Obstructive sleep apnea (adult) (pediatric): Secondary | ICD-10-CM | POA: Diagnosis not present

## 2022-05-02 DIAGNOSIS — C44319 Basal cell carcinoma of skin of other parts of face: Secondary | ICD-10-CM

## 2022-05-02 DIAGNOSIS — N1831 Chronic kidney disease, stage 3a: Secondary | ICD-10-CM | POA: Diagnosis not present

## 2022-05-02 DIAGNOSIS — I2510 Atherosclerotic heart disease of native coronary artery without angina pectoris: Secondary | ICD-10-CM | POA: Diagnosis not present

## 2022-05-02 DIAGNOSIS — G8929 Other chronic pain: Secondary | ICD-10-CM | POA: Diagnosis not present

## 2022-05-02 DIAGNOSIS — Z4802 Encounter for removal of sutures: Secondary | ICD-10-CM

## 2022-05-02 MED ORDER — LEVOTHYROXINE SODIUM 175 MCG PO TABS: 175.0000 ug | ORAL_TABLET | Freq: Every day | ORAL | 1 refills | Status: AC

## 2022-05-02 MED ORDER — OXYCODONE-ACETAMINOPHEN 7.5-325 MG PO TABS: 1.0000 | ORAL_TABLET | Freq: Three times a day (TID) | ORAL | 0 refills | Status: DC | PRN

## 2022-05-02 MED ORDER — MELOXICAM 7.5 MG PO TABS: 7.5000 mg | ORAL_TABLET | Freq: Every day | ORAL | 1 refills | Status: DC

## 2022-05-02 NOTE — Progress Notes (Signed)
Patient ID: Anthony [name], male    DOB: 1935/12/17, 86 y.o.   MRN: [id number]  This visit was conducted in person.  BP 124/82   Pulse 100   Temp 97.6 F (36.4 C) (Temporal)   Ht 6' (1.829 m)   Wt 275 lb 6 oz (124.9 kg)   SpO2 95%   BMI 37.35 kg/m    CC: 4 mo f/u visit  Subjective:   HPI: Anthony [name] is a 86 y.o. male presenting on 05/02/2022 for Follow-up (Here for 4 mo f/u.)   Saw derm s/p BCC excision to L cheek. Planned f/u today.   OSA on CPAP - DME supply company is [facility] fax [phone]. He is using CPAP nightly. Gets neck ache when he tightens mask - but if doesn't tighten mask gets air leak.  Asks about Inspire device.   Adult polysomnography test completed at [facility] [place] 06/23/2021 (Dr Anthony [name]) - dx mild OSA with AHI 9.2/hr, RDI 24.7/hr, O2 sat nadir of 74%. Multifocal PVCs and occasional PACs also noted. Elevated PLMS index of 112.8/hr - consider re evaluating once OSA controlled. He notes he continues tossing and turning every night.   Known severe spinal stenosis with severe foraminal stenosis bilaterally, unable to have MRI given spinal cord stimulator. Established with Dr Anthony [name] PMR - latest had L L3/L4/L5 RFA (#2) with good effect. He also has spinal cord stimulator.   [place] CSRS reviewed - last oxycodone filled 01/04/2022 - #30 by myself. Uses sparingly.   Dyslipidemia - on praluent ezetimibe and vascepa. Marked trig elevation 12/2021 - improved today.      Relevant past medical, surgical, family and social history reviewed and updated as indicated. Interim medical history since our last visit reviewed. Allergies and medications reviewed and updated. Outpatient Medications Prior to Visit  Medication Sig Dispense Refill   acetaminophen (TYLENOL) 500 MG tablet Take 1 tablet (500 mg total) by mouth 2 (two) times daily as needed for moderate pain.     Alirocumab (PRALUENT) 75 MG/ML SOAJ Inject 1 pen. into the skin every 14 (fourteen) days. 2 mL 11    amLODipine (NORVASC) 5 MG tablet TAKE 1 TABLET(5 MG) BY MOUTH DAILY 90 tablet 2   aspirin EC 81 MG tablet Take 1 tablet (81 mg total) by mouth daily.     Cholecalciferol (VITAMIN D3) 1000 units CAPS Take 1 capsule (1,000 Units total) by mouth daily. 30 capsule    ciclopirox (PENLAC) 8 % solution APPLY TOPICALLY AT BEDTIME APPLY OVER NAIL AND SURROUNDING SKIN, APPLY DAILY OVER PREVIOUS COAT, AFTER 7 DAYS MAY REMOVE WITH ALCOHOL AND REPEAT 6.6 mL 11   clopidogrel (PLAVIX) 75 MG tablet TAKE 1 TABLET(75 MG) BY MOUTH DAILY AT 6 AM 90 tablet 3   diphenhydramine-acetaminophen (TYLENOL PM) 25-500 MG TABS tablet Take 2 tablets by mouth at bedtime.     docusate sodium (COLACE) 100 MG capsule Take 100 mg by mouth daily.     Eszopiclone 3 MG TABS Take 1 tablet (3 mg total) by mouth at bedtime as needed. Use sparingly, take immediately before bedtime 30 tablet 0   ezetimibe (ZETIA) 10 MG tablet Take 1 tablet (10 mg total) by mouth daily. 90 tablet 3   finasteride (PROSCAR) 5 MG tablet TAKE 1 TABLET(5 MG) BY MOUTH DAILY 90 tablet 0   fluocinonide (LIDEX) 0.05 % external solution Apply 1 application topically 2 (two) times daily as needed. (Patient taking differently: Apply 1 application  topically daily as needed (  irritation).) 60 mL 2   furosemide (LASIX) 40 MG tablet TAKE 1 TABLET BY MOUTH EVERY DAY. TAKE AN EXTRA TABLET AS NEEDED FOR WEIGHT GAIN OF 3 LBS/DAY OR 5LBS/WEEK 135 tablet 3   gabapentin (NEURONTIN) 300 MG capsule TAKE 2 CAPSULE BY MOUTH AT BEDTIME WITH EXTRA CAPSULE DURING THE DAY AS NEEDED 200 capsule 1   icosapent Ethyl (VASCEPA) 1 g capsule Take 2 capsules (2 g total) by mouth 2 (two) times daily. 360 capsule 3   ketoconazole (NIZORAL) 2 % cream APPLY TO FEET AND FACE AT BEDTIME AS DIRECTED 60 g 3   ketoconazole (NIZORAL) 2 % shampoo APPLY TOPICALLY 3 TIMES WEEKLY AS DIRECTED 120 mL 2   LINZESS 290 MCG CAPS capsule TAKE 1 CAPSULE(290 MCG) BY MOUTH DAILY BEFORE BREAKFAST 90 capsule 1   lisinopril  (ZESTRIL) 10 MG tablet TAKE 1 TABLET(10 MG) BY MOUTH DAILY 90 tablet 3   Melatonin 10 MG CAPS Take 10 mg by mouth at bedtime.     mometasone (ELOCON) 0.1 % cream APPLY TOPICALLY TO THE AFFECTED AREA DAILY AS NEEDED FOR RASH 15 g 0   mometasone (ELOCON) 0.1 % lotion APPLY TO SCALP AT BEDTIME ON MONDAY, WEDNESDAY, AND FRIDAY 60 mL 3   Multiple Vitamins-Minerals (CENTRUM SILVER PO) Take 1 tablet by mouth daily.     Multiple Vitamins-Minerals (PRESERVISION AREDS 2 PO) Take 1 tablet by mouth daily.     mupirocin ointment (BACTROBAN) 2 % Apply 1 Application topically daily. With dressing changes 22 g 0   naproxen sodium (ALEVE) 220 MG tablet Take 220 mg by mouth daily as needed.     nitroGLYCERIN (NITROSTAT) 0.4 MG SL tablet Place 1 tablet (0.4 mg total) under the tongue every 5 (five) minutes as needed for chest pain. 25 tablet 0   ondansetron (ZOFRAN) 4 MG tablet Take 1 tablet (4 mg total) by mouth every 6 (six) hours as needed for nausea. 20 tablet 0   senna (SENOKOT) 8.6 MG tablet Take 1 tablet (8.6 mg total) by mouth daily. 30 tablet 3   tamsulosin (FLOMAX) 0.4 MG CAPS capsule Take 1 capsule (0.4 mg total) by mouth daily. 90 capsule 3   tiZANidine (ZANAFLEX) 2 MG tablet TAKE 1 TO 2 TABLETS(2 TO 4 MG) BY MOUTH TWICE DAILY AS NEEDED FOR MUSCLE SPASMS 30 tablet 3   traZODone (DESYREL) 50 MG tablet Take 1 tablet (50 mg total) by mouth at bedtime as needed for sleep. 30 tablet 3   levothyroxine (SYNTHROID) 150 MCG tablet TAKE 1 TABLET(150 MCG) BY MOUTH DAILY BEFORE BREAKFAST 90 tablet 3   meloxicam (MOBIC) 15 MG tablet Take 1 tablet (15 mg total) by mouth daily.     oxyCODONE-acetaminophen (PERCOCET) 7.5-325 MG tablet Take 1 tablet by mouth 3 (three) times daily as needed for severe pain. 30 tablet 0   No facility-administered medications prior to visit.     Per HPI unless specifically indicated in ROS section below Review of Systems  Objective:  BP 124/82   Pulse 100   Temp 97.6 F (36.4 C)  (Temporal)   Ht 6' (1.829 m)   Wt 275 lb 6 oz (124.9 kg)   SpO2 95%   BMI 37.35 kg/m   Wt Readings from Last 3 Encounters:  05/02/22 275 lb 6 oz (124.9 kg)  04/10/22 260 lb (117.9 kg)  02/15/22 270 lb (122.5 kg)      Physical Exam Vitals and nursing note reviewed.  Constitutional:      Appearance: Normal  appearance. He is obese. He is not ill-appearing.     Comments: Ambulates with walker  Cardiovascular:     Rate and Rhythm: Regular rhythm. Tachycardia present.     Pulses: Normal pulses.     Heart sounds: Normal heart sounds. No murmur heard.    Comments: Mild tachycardia Pulmonary:     Effort: Pulmonary effort is normal. No respiratory distress.     Breath sounds: Normal breath sounds. No wheezing, rhonchi or rales.  Musculoskeletal:     Right lower leg: No edema.     Left lower leg: No edema.  Skin:    General: Skin is warm and dry.     Findings: No rash.  Neurological:     Mental Status: He is alert.  Psychiatric:        Mood and Affect: Mood normal.        Behavior: Behavior normal.       Results for orders placed or performed in visit on 04/25/22  Parathyroid hormone, intact (no Ca)  Result Value Ref Range   PTH CANCELED   Hemoglobin A1c  Result Value Ref Range   Hgb A1c MFr Bld 6.3 4.6 - 6.5 %  VITAMIN D 25 Hydroxy (Vit-D Deficiency, Fractures)  Result Value Ref Range   VITD 33.91 30.00 - 100.00 ng/mL  CBC with Differential/Platelet  Result Value Ref Range   WBC 8.2 4.0 - 10.5 K/uL   RBC 3.99 (L) 4.22 - 5.81 Mil/uL   Hemoglobin 12.8 (L) 13.0 - 17.0 g/dL   HCT 38.1 (L) 39.0 - 52.0 %   MCV 95.6 78.0 - 100.0 fl   MCHC 33.6 30.0 - 36.0 g/dL   RDW 14.8 11.5 - 15.5 %   Platelets 288.0 150.0 - 400.0 K/uL   Neutrophils Relative % 45.1 43.0 - 77.0 %   Lymphocytes Relative 30.6 12.0 - 46.0 %   Monocytes Relative 9.5 3.0 - 12.0 %   Eosinophils Relative 11.6 (H) 0.0 - 5.0 %   Basophils Relative 3.2 (H) 0.0 - 3.0 %   Neutro Abs 3.7 1.4 - 7.7 K/uL   Lymphs  Abs 2.5 0.7 - 4.0 K/uL   Monocytes Absolute 0.8 0.1 - 1.0 K/uL   Eosinophils Absolute 1.0 (H) 0.0 - 0.7 K/uL   Basophils Absolute 0.3 (H) 0.0 - 0.1 K/uL  TSH  Result Value Ref Range   TSH 7.36 (H) 0.35 - 5.50 uIU/mL  Comprehensive metabolic panel  Result Value Ref Range   Sodium 140 135 - 145 mEq/L   Potassium 4.7 3.5 - 5.1 mEq/L   Chloride 103 96 - 112 mEq/L   CO2 29 19 - 32 mEq/L   Glucose, Bld 106 (H) 70 - 99 mg/dL   BUN 23 6 - 23 mg/dL   Creatinine, Ser 1.34 0.40 - 1.50 mg/dL   Total Bilirubin 0.5 0.2 - 1.2 mg/dL   Alkaline Phosphatase 84 39 - 117 U/L   AST 16 0 - 37 U/L   ALT 13 0 - 53 U/L   Total Protein 7.3 6.0 - 8.3 g/dL   Albumin 4.2 3.5 - 5.2 g/dL   GFR 48.63 (L) >60.00 mL/min   Calcium 10.5 8.4 - 10.5 mg/dL  Lipid panel  Result Value Ref Range   Cholesterol 161 0 - 200 mg/dL   Triglycerides 332.0 (H) 0.0 - 149.0 mg/dL   HDL 45.30 >39.00 mg/dL   VLDL 66.4 (H) 0.0 - 40.0 mg/dL   Total CHOL/HDL Ratio 4    NonHDL 116.07  LDL cholesterol, direct  Result Value Ref Range   Direct LDL 47.0 mg/dL  Extra Specimen  Result Value Ref Range   Extra tube recieved     Specimen type recieved Serum Separator (SST)     Assessment & Plan:   Problem List Items Addressed This Visit     Encounter for chronic pain management - Primary (Chronic)     CSRS reviewed.  Continue sparing opiate use - refilled today.  Lower meloxicam dose given CKD.       Hypercholesterolemia with hypertriglyceridemia    Statin intolerance - continues zetia, praluent, vascepa. Previous Trig markedly elevated >500 - on repeat improvement noted but still high (332) - reviewed diet choices to improve triglycerides.  The ASCVD Risk score (Arnett DK, et al., 2019) failed to calculate for the following reasons:   The 2019 ASCVD risk score is only valid for ages 32 to 72       OSA on CPAP    Having difficulty with CPAP mask/machine. Interested in ENT evaluation for Inspire - he will follow up with Dr  Reola Mosher office, and let me know if he needs referral.       Hypothyroidism    TSH elevated - will increase levothyroxine to 19mg daily, RTC 4-6 wks rpt thyroid function.  Denies significant hypothyroid symptoms but weight gain noted.       Relevant Medications   levothyroxine (SYNTHROID) 175 MCG tablet   CKD (chronic kidney disease) stage 3, GFR 30-59 ml/min (HCC)    Chronic, GFR stable 40-50s over the years.  Discussed limiting NSAID use - he agrees to drop dose of meloxicam to 7.'5mg'$  daily.         Meds ordered this encounter  Medications   oxyCODONE-acetaminophen (PERCOCET) 7.5-325 MG tablet    Sig: Take 1 tablet by mouth 3 (three) times daily as needed for severe pain.    Dispense:  30 tablet    Refill:  0    Chronic pain management medication   levothyroxine (SYNTHROID) 175 MCG tablet    Sig: Take 1 tablet (175 mcg total) by mouth daily before breakfast.    Dispense:  90 tablet    Refill:  1    Note new dose   meloxicam (MOBIC) 7.5 MG tablet    Sig: Take 1 tablet (7.5 mg total) by mouth daily.    Dispense:  90 tablet    Refill:  1    Note new dose   No orders of the defined types were placed in this encounter.    Patient Instructions  Check with ENT about Inspire device for sleep apnea.  Increase levothyroxine to 1748m daily.  Decrease meloxicam to 7.'5mg'$  daily - due to kidney function. Return in 4 months for follow up visit.   Follow up plan: Return in about 4 months (around 09/01/2022) for follow up visit.  JaRia BushMD

## 2022-05-02 NOTE — Progress Notes (Signed)
   Follow-Up Visit   Subjective  Anthony [name] is a 86 y.o. male who presents for the following: Suture / Staple Removal (Patient here today for suture removal at left cheek s/p excision for BCC. Patient advises no problems. ).   The following portions of the chart were reviewed this encounter and updated as appropriate:       Review of Systems:  No other skin or systemic complaints except as noted in HPI or Assessment and Plan.  Objective  Well appearing patient in no apparent distress; mood and affect are within normal limits.  A focused examination was performed including face. Relevant physical exam findings are noted in the Assessment and Plan.    Assessment & Plan   Encounter for Removal of Sutures - Incision site at the left cheek is clean, dry and intact - Wound cleansed, sutures removed, wound cleansed and steri strips applied.  - Discussed pathology results showing margins free  - Patient advised to keep steri-strips dry until they fall off. - Scars remodel for a full year. - Once steri-strips fall off, patient can apply over-the-counter silicone scar cream each night to help with scar remodeling if desired. - Patient advised to call with any concerns or if they notice any new or changing lesions.  Return for as scheduled.  Graciella Belton, RMA, am acting as scribe for Brendolyn Patty, MD .  Documentation: I have reviewed the above documentation for accuracy and completeness, and I agree with the above.  Brendolyn Patty MD

## 2022-05-02 NOTE — Patient Instructions (Addendum)
Check with ENT about Inspire device for sleep apnea.  Increase levothyroxine to 118mg daily.  Decrease meloxicam to 7.'5mg'$  daily - due to kidney function. Return in 4 months for follow up visit.

## 2022-05-02 NOTE — Patient Instructions (Signed)
Due to recent changes in healthcare laws, you may see results of your pathology and/or laboratory studies on MyChart before the doctors have had a chance to review them. We understand that in some cases there may be results that are confusing or concerning to you. Please understand that not all results are received at the same time and often the doctors may need to interpret multiple results in order to provide you with the best plan of care or course of treatment. Therefore, we ask that you please give us 2 business days to thoroughly review all your results before contacting the office for clarification. Should we see a critical lab result, you will be contacted sooner.   If You Need Anything After Your Visit  If you have any questions or concerns for your doctor, please call our main line at [phone] and press option 4 to reach your doctor's medical assistant. If no one answers, please leave a voicemail as directed and we will return your call as soon as possible. Messages left after 4 pm will be answered the following business day.   You may also send us a message via MyChart. We typically respond to MyChart messages within 1-2 business days.  For prescription refills, please ask your pharmacy to contact our office. Our fax number is [phone].  If you have an urgent issue when the clinic is closed that cannot wait until the next business day, you can page your doctor at the number below.    Please note that while we do our best to be available for urgent issues outside of office hours, we are not available 24/7.   If you have an urgent issue and are unable to reach us, you may choose to seek medical care at your doctor's office, retail clinic, urgent care center, or emergency room.  If you have a medical emergency, please immediately call 911 or go to the emergency department.  Pager Numbers  - Dr. Kowalski: [phone]  - Dr. Moye: [phone]  - Dr. Stewart:  [phone]  In the event of inclement weather, please call our main line at [phone] for an update on the status of any delays or closures.  Dermatology Medication Tips: Please keep the boxes that topical medications come in in order to help keep track of the instructions about where and how to use these. Pharmacies typically print the medication instructions only on the boxes and not directly on the medication tubes.   If your medication is too expensive, please contact our office at [phone] option 4 or send us a message through MyChart.   We are unable to tell what your co-pay for medications will be in advance as this is different depending on your insurance coverage. However, we may be able to find a substitute medication at lower cost or fill out paperwork to get insurance to cover a needed medication.   If a prior authorization is required to get your medication covered by your insurance company, please allow us 1-2 business days to complete this process.  Drug prices often vary depending on where the prescription is filled and some pharmacies may offer cheaper prices.  The website www.goodrx.com contains coupons for medications through different pharmacies. The prices here do not account for what the cost may be with help from insurance (it may be cheaper with your insurance), but the website can give you the price if you did not use any insurance.  - You can print the associated coupon and take it with   your prescription to the pharmacy.  - You may also stop by our office during regular business hours and pick up a GoodRx coupon card.  - If you need your prescription sent electronically to a different pharmacy, notify our office through [facility] MyChart or by phone at [phone] option 4.     Si Usted Necesita Algo Despus de Su Visita  Tambin puede enviarnos un mensaje a travs de MyChart. Por lo general respondemos a los mensajes de MyChart en el transcurso de 1 a 2  das hbiles.  Para renovar recetas, por favor pida a su farmacia que se ponga en contacto con nuestra oficina. Nuestro nmero de fax es el [phone].  Si tiene un asunto urgente cuando la clnica est cerrada y que no puede esperar hasta el siguiente da hbil, puede llamar/localizar a su doctor(a) al nmero que aparece a continuacin.   Por favor, tenga en cuenta que aunque hacemos todo lo posible para estar disponibles para asuntos urgentes fuera del horario de oficina, no estamos disponibles las 24 horas del da, los 7 das de la semana.   Si tiene un problema urgente y no puede comunicarse con nosotros, puede optar por buscar atencin mdica  en el consultorio de su doctor(a), en una clnica privada, en un centro de atencin urgente o en una sala de emergencias.  Si tiene una emergencia mdica, por favor llame inmediatamente al 911 o vaya a la sala de emergencias.  Nmeros de bper  - Dr. Kowalski: [phone]  - Dra. Moye: [phone]  - Dra. Stewart: [phone]  En caso de inclemencias del tiempo, por favor llame a nuestra lnea principal al [phone] para una actualizacin sobre el estado de cualquier retraso o cierre.  Consejos para la medicacin en dermatologa: Por favor, guarde las cajas en las que vienen los medicamentos de uso tpico para ayudarle a seguir las instrucciones sobre dnde y cmo usarlos. Las farmacias generalmente imprimen las instrucciones del medicamento slo en las cajas y no directamente en los tubos del medicamento.   Si su medicamento es muy caro, por favor, pngase en contacto con nuestra oficina llamando al [phone] y presione la opcin 4 o envenos un mensaje a travs de MyChart.   No podemos decirle cul ser su copago por los medicamentos por adelantado ya que esto es diferente dependiendo de la cobertura de su seguro. Sin embargo, es posible que podamos encontrar un medicamento sustituto a menor costo o llenar un formulario para que el  seguro cubra el medicamento que se considera necesario.   Si se requiere una autorizacin previa para que su compaa de seguros cubra su medicamento, por favor permtanos de 1 a 2 das hbiles para completar este proceso.  Los precios de los medicamentos varan con frecuencia dependiendo del lugar de dnde se surte la receta y alguna farmacias pueden ofrecer precios ms baratos.  El sitio web www.goodrx.com tiene cupones para medicamentos de diferentes farmacias. Los precios aqu no tienen en cuenta lo que podra costar con la ayuda del seguro (puede ser ms barato con su seguro), pero el sitio web puede darle el precio si no utiliz ningn seguro.  - Puede imprimir el cupn correspondiente y llevarlo con su receta a la farmacia.  - Tambin puede pasar por nuestra oficina durante el horario de atencin regular y recoger una tarjeta de cupones de GoodRx.  - Si necesita que su receta se enve electrnicamente a una farmacia diferente, informe a nuestra oficina a travs de MyChart de    o por telfono llamando al [phone] y presione la opcin 4.  

## 2022-05-03 ENCOUNTER — Other Ambulatory Visit: Payer: Self-pay | Admitting: Family Medicine

## 2022-05-03 DIAGNOSIS — N1831 Chronic kidney disease, stage 3a: Secondary | ICD-10-CM

## 2022-05-04 NOTE — Assessment & Plan Note (Addendum)
Statin intolerance - continues zetia, praluent, vascepa. Previous Trig markedly elevated >500 - on repeat improvement noted but still high (332) - reviewed diet choices to improve triglycerides.  The ASCVD Risk score (Arnett DK, et al., 2019) failed to calculate for the following reasons:   The 2019 ASCVD risk score is only valid for ages 53 to 40

## 2022-05-04 NOTE — Assessment & Plan Note (Addendum)
Having difficulty with CPAP mask/machine. Interested in ENT evaluation for Inspire - he will follow up with Dr Reola Mosher office, and let me know if he needs referral.

## 2022-05-04 NOTE — Assessment & Plan Note (Addendum)
Chronic, GFR stable 40-50s over the years.  Discussed limiting NSAID use - he agrees to drop dose of meloxicam to 7.'5mg'$  daily.

## 2022-05-04 NOTE — Assessment & Plan Note (Signed)
TSH elevated - will increase levothyroxine to 172mg daily, RTC 4-6 wks rpt thyroid function.  Denies significant hypothyroid symptoms but weight gain noted.

## 2022-05-04 NOTE — Assessment & Plan Note (Addendum)
[place] CSRS reviewed.  Continue sparing opiate use - refilled today.  Lower meloxicam dose given CKD.

## 2022-05-08 ENCOUNTER — Encounter: Payer: Self-pay | Admitting: Student in an Organized Health Care Education/Training Program

## 2022-05-08 ENCOUNTER — Ambulatory Visit
Payer: Medicare Other | Attending: Student in an Organized Health Care Education/Training Program | Admitting: Student in an Organized Health Care Education/Training Program

## 2022-05-08 VITALS — BP 121/72 | HR 95 | Temp 97.9°F | Resp 16 | Ht 72.0 in | Wt 270.0 lb

## 2022-05-08 DIAGNOSIS — G894 Chronic pain syndrome: Secondary | ICD-10-CM | POA: Diagnosis not present

## 2022-05-08 DIAGNOSIS — G8929 Other chronic pain: Secondary | ICD-10-CM | POA: Diagnosis not present

## 2022-05-08 DIAGNOSIS — M5416 Radiculopathy, lumbar region: Secondary | ICD-10-CM | POA: Diagnosis not present

## 2022-05-08 NOTE — Patient Instructions (Signed)
Epidural Steroid Injection Patient Information  Description: The epidural space surrounds the nerves as they exit the spinal cord.  In some patients, the nerves can be compressed and inflamed by a bulging disc or a tight spinal canal (spinal stenosis).  By injecting steroids into the epidural space, we can bring irritated nerves into direct contact with a potentially helpful medication.  These steroids act directly on the irritated nerves and can reduce swelling and inflammation which often leads to decreased pain.  Epidural steroids may be injected anywhere along the spine and from the neck to the low back depending upon the location of your pain.   After numbing the skin with local anesthetic (like Novocaine), a small needle is passed into the epidural space slowly.  You may experience a sensation of pressure while this is being done.  The entire block usually last less than 10 minutes.  Conditions which may be treated by epidural steroids:  Low back and leg pain Neck and arm pain Spinal stenosis Post-laminectomy syndrome Herpes zoster (shingles) pain Pain from compression fractures  Preparation for the injection:  Do not eat any solid food or dairy products within 8 hours of your appointment.  You may drink clear liquids up to 3 hours before appointment.  Clear liquids include water, black coffee, juice or soda.  No milk or cream please. You may take your regular medication, including pain medications, with a sip of water before your appointment  Diabetics should hold regular insulin (if taken separately) and take 1/2 normal NPH dos the morning of the procedure.  Carry some sugar containing items with you to your appointment. A driver must accompany you and be prepared to drive you home after your procedure.  Bring all your current medications with your. An IV may be inserted and sedation may be given at the discretion of the physician.   A blood pressure cuff, EKG and other monitors will  often be applied during the procedure.  Some patients may need to have extra oxygen administered for a short period. You will be asked to provide medical information, including your allergies, prior to the procedure.  We must know immediately if you are taking blood thinners (like Coumadin/Warfarin)  Or if you are allergic to IV iodine contrast (dye). We must know if you could possible be pregnant.  Possible side-effects: Bleeding from needle site Infection (rare, may require surgery) Nerve injury (rare) Numbness & tingling (temporary) Difficulty urinating (rare, temporary) Spinal headache ( a headache worse with upright posture) Light -headedness (temporary) Pain at injection site (several days) Decreased blood pressure (temporary) Weakness in arm/leg (temporary) Pressure sensation in back/neck (temporary)  Call if you experience: Fever/chills associated with headache or increased back/neck pain. Headache worsened by an upright position. New onset weakness or numbness of an extremity below the injection site Hives or difficulty breathing (go to the emergency room) Inflammation or drainage at the infection site Severe back/neck pain Any new symptoms which are concerning to you  Please note:  Although the local anesthetic injected can often make your back or neck feel good for several hours after the injection, the pain will likely return.  It takes 3-7 days for steroids to work in the epidural space.  You may not notice any pain relief for at least that one week.  If effective, we will often do a series of three injections spaced 3-6 weeks apart to maximally decrease your pain.  After the initial series, we generally will wait several months before   considering a repeat injection of the same type.  If you have any questions, please call [phone] [facility] 

## 2022-05-08 NOTE — Progress Notes (Signed)
Safety precautions to be maintained throughout the outpatient stay will include: orient to surroundings, keep bed in low position, maintain call bell within reach at all times, provide assistance with transfer out of bed and ambulation.  

## 2022-05-08 NOTE — Progress Notes (Signed)
PROVIDER NOTE: Information contained herein reflects review and annotations entered in association with encounter. Interpretation of such information and data should be left to medically-trained personnel. Information provided to patient can be located elsewhere in the medical record under "Patient Instructions". Document created using STT-dictation technology, any transcriptional errors that may result from process are unintentional.    [name]  Service Category: E/M  Provider: Gillis Santa, MD  DOB: [birth year]  DOS: 05/08/2022  Referring Provider: Ria Bush, MD  MRN: [id number]  Specialty: Interventional Pain Management  PCP: Ria Bush, MD  Type: Established Patient  Setting: Ambulatory outpatient    Location: Office  Delivery: Face-to-face     HPI  Anthony [name], a 86 y.o. year old male, is here today because of his Chronic radicular lumbar pain [M54.16, G89.29]. Anthony [name] primary complain today is Back Pain (lower) Last encounter: My last encounter with him was on 04/18/2022. Pain Assessment: Severity of Chronic pain is reported as a 5 /10. Location: Back Lower/radiates down left leg. Onset: More than a month ago. Quality: Constant, Discomfort. Timing: Constant. Modifying factor(s): procedure-Left facet block. Vitals:  height is 6' (1.829 m) and weight is 270 lb (122.5 kg). His temporal temperature is 97.9 F (36.6 C). His blood pressure is 121/72 and his pulse is 95. His respiration is 16 and oxygen saturation is 97%.   Reason for encounter: post-procedure evaluation and assessment.     Post-procedure evaluation   Type: Therapeutic Medial Branch Facet Block # 3 Region: Lumbar Level: L3, L4, L5, Medial Branch Level(s) Laterality: Left  Effectiveness:  Initial hour after procedure: 10 %  Subsequent 4-6 hours post-procedure: 25 %  Analgesia past initial 6 hours: 75 %  Ongoing improvement:  Analgesic:  75% Function: Somewhat improved ROM: Somewhat  improved   ROS  Constitutional: Denies any fever or chills Gastrointestinal: No reported hemesis, hematochezia, vomiting, or acute GI distress Musculoskeletal:  Improvement in axial low back pain, patient now endorsing increased left leg radiating pain Neurological:  Radiating left leg pain  Medication Review  Alirocumab, Eszopiclone, Melatonin, Multiple Vitamins-Minerals, Vitamin D3, acetaminophen, amLODipine, aspirin EC, ciclopirox, clopidogrel, diphenhydramine-acetaminophen, docusate sodium, ezetimibe, finasteride, fluocinonide, furosemide, gabapentin, icosapent Ethyl, ketoconazole, levothyroxine, linaclotide, lisinopril, meloxicam, mometasone, mupirocin ointment, naproxen sodium, nitroGLYCERIN, ondansetron, oxyCODONE-acetaminophen, senna, tamsulosin, tiZANidine, and traZODone  History Review  Allergy: Anthony [name] is allergic to [place] [cholestyramine], atorvastatin, gemfibrozil, metformin and related, and rosuvastatin. Drug: Anthony [name]  reports no history of drug use. Alcohol:  reports no history of alcohol use. Tobacco:  reports that he has never smoked. He has never been exposed to tobacco smoke. He has never used smokeless tobacco. Social: Anthony [name]  reports that he has never smoked. He has never been exposed to tobacco smoke. He has never used smokeless tobacco. He reports that he does not drink alcohol and does not use drugs. Medical:  has a past medical history of Anosmia (1980s), Atrial flutter ([place]) (2018), Basal cell carcinoma (01/11/2022), CAD (coronary artery disease), Cancer ([place]), Chronic constipation, Chronic insomnia, Chronic lower back pain, Complication of anesthesia, Diastolic CHF ([place]), History of diverticulitis (2017), History of pneumonia (2013), Hyperlipidemia, Hypertension, Hypothyroidism, Obesity, Class II, BMI 35-39.9, with comorbidity, OSA on CPAP, and PONV (postoperative nausea and vomiting). Surgical: Anthony [name]  has a past surgical history that includes  Percutaneous coronary stent intervention (pci-s) (2013); Replacement total knee bilateral (Bilateral, 2000s); Spinal cord stimulator implant (06/2016); Nasal sinus surgery; A-FLUTTER ABLATION (N/A, 05/07/2017); Radiofrequency ablation (06/2017); Colonoscopy (2007); Cardiovascular stress  test (11/2017); Elbow surgery (Right); Laminectomy thoracic spine w/ placement spinal cord stimulator (08/2018); Spinal cord stimulator insertion (N/A, 09/09/2018); Spinal cord stimulator insertion (N/A, 09/16/2018); and ENDOVASCULAR REPAIR/STENT GRAFT (N/A, 05/05/2020). Family: family history includes Kidney disease in his father; Leukemia in his brother; Stroke in his mother and sister.  Laboratory Chemistry Profile   Renal Lab Results  Component Value Date   BUN 23 04/25/2022   CREATININE 1.34 04/25/2022   BCR 16 05/03/2017   GFR 48.63 (L) 04/25/2022   GFRAA 45 (L) 05/06/2020   GFRNONAA >60 10/31/2021    Hepatic Lab Results  Component Value Date   AST 16 04/25/2022   ALT 13 04/25/2022   ALBUMIN 4.2 04/25/2022   ALKPHOS 84 04/25/2022   LIPASE 42 10/30/2021    Electrolytes Lab Results  Component Value Date   NA 140 04/25/2022   K 4.7 04/25/2022   CL 103 04/25/2022   CALCIUM 10.5 04/25/2022   PHOS 2.9 09/28/2020    Bone Lab Results  Component Value Date   VD25OH 33.91 04/25/2022   VD125OH2TOT 51 10/28/2018   [id number] 51 10/28/2018   [id number] <8 10/28/2018    Inflammation (CRP: Acute Phase) (ESR: Chronic Phase) Lab Results  Component Value Date   LATICACIDVEN 1.5 10/30/2021         Note: Above Lab results reviewed.   Physical Exam  General appearance: Well nourished, well developed, and well hydrated. In no apparent acute distress Mental status: Alert, oriented x 3 (person, place, & time)       Respiratory: No evidence of acute respiratory distress Eyes: PERLA Vitals: BP 121/72   Pulse 95   Temp 97.9 F (36.6 C) (Temporal)   Resp 16   Ht 6' (1.829 m)   Wt 270 lb (122.5 kg)    SpO2 97%   BMI 36.62 kg/m  BMI: Estimated body mass index is 36.62 kg/m as calculated from the following:   Height as of this encounter: 6' (1.829 m).   Weight as of this encounter: 270 lb (122.5 kg). Ideal: Ideal body weight: 77.6 kg (171 lb 1.2 oz) Adjusted ideal body weight: 95.5 kg (210 lb 10.3 oz)  Lumbar Spine Area Exam  Skin & Axial Inspection: No masses, redness, or swelling Alignment: Symmetrical Functional ROM: Pain restricted ROM affecting primarily the left Stability: No instability detected Muscle Tone/Strength: Functionally intact. No obvious neuro-muscular anomalies detected. Sensory (Neurological): Dermatomal pain pattern left L4/5  Gait & Posture Assessment  Ambulation: Unassisted Gait: Relatively normal for age and body habitus Posture: WNL  Lower Extremity Exam    Side: Right lower extremity  Side: Left lower extremity  Stability: No instability observed          Stability: No instability observed          Skin & Extremity Inspection: Skin color, temperature, and hair growth are WNL. No peripheral edema or cyanosis. No masses, redness, swelling, asymmetry, or associated skin lesions. No contractures.  Skin & Extremity Inspection: Skin color, temperature, and hair growth are WNL. No peripheral edema or cyanosis. No masses, redness, swelling, asymmetry, or associated skin lesions. No contractures.  Functional ROM: Unrestricted ROM                  Functional ROM: Pain restricted ROM for hip and knee joints          Muscle Tone/Strength: Functionally intact. No obvious neuro-muscular anomalies detected.  Muscle Tone/Strength: Functionally intact. No obvious neuro-muscular anomalies detected.  Sensory (Neurological): Unimpaired  Sensory (Neurological): Dermatomal pain pattern        DTR: Patellar: deferred today Achilles: deferred today Plantar: deferred today  DTR: Patellar: deferred today Achilles: deferred today Plantar: deferred today  Palpation: No  palpable anomalies  Palpation: No palpable anomalies    Assessment   Diagnosis Status  1. Chronic radicular lumbar pain   2. Lumbar radiculopathy   3. Chronic pain syndrome    Having a Flare-up Having a Flare-up Controlled   Updated Problems: Problem  Lumbar Radiculopathy    Plan of Care  1. Chronic radicular lumbar pain - Lumbar Epidural Injection; Future  2. Lumbar radiculopathy - Lumbar Epidural Injection; Future  3. Chronic pain syndrome - Lumbar Epidural Injection; Future  Patient instructed to stop Plavix 7 days prior to [facility].  Okay to continue 81 mg aspirin.  Orders:  Orders Placed This Encounter  Procedures   Lumbar Epidural Injection    Standing Status:   Future    Standing Expiration Date:   08/07/2022    Scheduling Instructions:     Procedure: Interlaminar Lumbar Epidural Steroid injection (LESI)            Laterality: L4/5     Timeframe: ASAA    Order Specific Question:   Where will this procedure be performed?    Answer:   [facility]   Follow-up plan:   Return in about 9 days (around 05/17/2022) for L4/5 ESI, in clinic NS (stop Plavix 7 days prior, ok to continue ASA 81 mg).    Recent Visits Date Type Provider Dept  04/10/22 Procedure visit Gillis Santa, MD [facility]  03/29/22 Office Visit Gillis Santa, MD [facility]  02/15/22 Procedure visit Gillis Santa, MD [facility]  Showing recent visits within past 90 days and meeting all other requirements Today's Visits Date Type Provider Dept  05/08/22 Office Visit Gillis Santa, MD [facility]  Showing today's visits and meeting all other requirements Future Appointments Date Type Provider Dept  05/17/22 Appointment Gillis Santa, MD [facility]  Showing future appointments within next 90 days and meeting all other requirements  I discussed the assessment and treatment plan with the patient. The patient was provided an opportunity to  ask questions and all were answered. The patient agreed with the plan and demonstrated an understanding of the instructions.  Patient advised to call back or seek an in-person evaluation if the symptoms or condition worsens.  Duration of encounter: 47mnutes.  Total time on encounter, as per AMA guidelines included both the face-to-face and non-face-to-face time personally spent by the physician and/or other qualified health care professional(s) on the day of the encounter (includes time in activities that require the physician or other qualified health care professional and does not include time in activities normally performed by clinical staff). Physician's time may include the following activities when performed: preparing to see the patient (eg, review of tests, pre-charting review of records) obtaining and/or reviewing separately obtained history performing a medically appropriate examination and/or evaluation counseling and educating the patient/family/caregiver ordering medications, tests, or procedures referring and communicating with other health care professionals (when not separately reported) documenting clinical information in the electronic or other health record independently interpreting results (not separately reported) and communicating results to the patient/ family/caregiver care coordination (not separately reported)  Note by: BGillis Santa MD Date: 05/08/2022; Time: 2:50 PM

## 2022-05-17 ENCOUNTER — Ambulatory Visit
Payer: Medicare Other | Attending: Student in an Organized Health Care Education/Training Program | Admitting: Student in an Organized Health Care Education/Training Program

## 2022-05-17 ENCOUNTER — Ambulatory Visit
Admission: RE | Admit: 2022-05-17 | Discharge: 2022-05-17 | Disposition: A | Discharge status: Home / Self Care | Payer: Medicare Other | Source: Ambulatory Visit | Attending: Student in an Organized Health Care Education/Training Program | Admitting: Student in an Organized Health Care Education/Training Program

## 2022-05-17 ENCOUNTER — Encounter: Payer: Self-pay | Admitting: Student in an Organized Health Care Education/Training Program

## 2022-05-17 VITALS — BP 129/97 | HR 47 | Temp 98.1°F | Resp 18 | Ht 72.0 in | Wt 270.0 lb

## 2022-05-17 DIAGNOSIS — G8929 Other chronic pain: Secondary | ICD-10-CM | POA: Diagnosis not present

## 2022-05-17 DIAGNOSIS — G894 Chronic pain syndrome: Secondary | ICD-10-CM | POA: Insufficient documentation

## 2022-05-17 DIAGNOSIS — M5416 Radiculopathy, lumbar region: Secondary | ICD-10-CM | POA: Insufficient documentation

## 2022-05-17 MED ORDER — LIDOCAINE HCL (PF) 2 % IJ SOLN
INTRAMUSCULAR | Status: AC
  Filled 2022-05-17: qty 10

## 2022-05-17 MED ORDER — DEXAMETHASONE SODIUM PHOSPHATE 10 MG/ML IJ SOLN
INTRAMUSCULAR | Status: AC
  Filled 2022-05-17: qty 1

## 2022-05-17 MED ORDER — SODIUM CHLORIDE 0.9% FLUSH
2.0000 mL | Freq: Once | INTRAVENOUS | Status: AC
  Administered 2022-05-17: 2 mL

## 2022-05-17 MED ORDER — DEXAMETHASONE SODIUM PHOSPHATE 10 MG/ML IJ SOLN
10.0000 mg | Freq: Once | INTRAMUSCULAR | Status: AC
  Administered 2022-05-17: 10 mg

## 2022-05-17 MED ORDER — IOHEXOL 180 MG/ML  SOLN
INTRAMUSCULAR | Status: AC
  Filled 2022-05-17: qty 20

## 2022-05-17 MED ORDER — SODIUM CHLORIDE (PF) 0.9 % IJ SOLN
INTRAMUSCULAR | Status: AC
  Filled 2022-05-17: qty 10

## 2022-05-17 MED ORDER — LIDOCAINE HCL 2 % IJ SOLN
20.0000 mL | Freq: Once | INTRAMUSCULAR | Status: AC
Start: 2022-05-17 — End: 2022-05-17
  Administered 2022-05-17: 100 mg

## 2022-05-17 MED ORDER — ROPIVACAINE HCL 2 MG/ML IJ SOLN
INTRAMUSCULAR | Status: AC
  Filled 2022-05-17: qty 20

## 2022-05-17 MED ORDER — ROPIVACAINE HCL 2 MG/ML IJ SOLN
2.0000 mL | Freq: Once | INTRAMUSCULAR | Status: AC
  Administered 2022-05-17: 2 mL via EPIDURAL

## 2022-05-17 MED ORDER — IOHEXOL 180 MG/ML  SOLN
10.0000 mL | Freq: Once | INTRAMUSCULAR | Status: AC
Start: 2022-05-17 — End: 2022-05-17
  Administered 2022-05-17: 10 mL via EPIDURAL

## 2022-05-17 NOTE — Progress Notes (Signed)
Safety precautions to be maintained throughout the outpatient stay will include: orient to surroundings, keep bed in low position, maintain call bell within reach at all times, provide assistance with transfer out of bed and ambulation.  

## 2022-05-17 NOTE — Progress Notes (Signed)
PROVIDER NOTE: Interpretation of information contained herein should be left to medically-trained personnel. Specific patient instructions are provided elsewhere under "Patient Instructions" section of medical record. This document was created in part using STT-dictation technology, any transcriptional errors that may result from this process are unintentional.  [name] Type: Established DOB: Oct 22, 1935 MRN: [id number] PCP: Ria Bush, MD  Service: Procedure DOS: 05/17/2022 Setting: Ambulatory Location: Ambulatory outpatient facility Delivery: Face-to-face Provider: Gillis Santa, MD Specialty: Interventional Pain Management Specialty designation: 09 Location: Outpatient facility Ref. Prov.: Ria Bush, MD    Primary Reason for Visit: Interventional Pain Management Treatment. CC: Back Pain   Procedure:           Type: Lumbar epidural steroid injection (LESI) (interlaminar)  Laterality: Left   Level:  L5-S1 Level.  Imaging: Fluoroscopic guidance         Anesthesia: Local anesthesia (1-2% Lidocaine) DOS: 05/17/2022  Performed by: Gillis Santa, MD  Purpose: Diagnostic/Therapeutic Indications: Lumbar radicular pain of intraspinal etiology of more than 4 weeks that has failed to respond to conservative therapy and is severe enough to impact quality of life or function. 1. Chronic radicular lumbar pain   2. Lumbar radiculopathy   3. Chronic pain syndrome    NAS-11 Pain score:   Pre-procedure: 10-Worst pain ever/10   Post-procedure: 2 /10      Position / Prep / Materials:  Position: Prone w/ head of the table raised (slight reverse trendelenburg) to facilitate breathing.  Prep solution: DuraPrep (Iodine Povacrylex [0.7% available iodine] and Isopropyl Alcohol, 74% w/w) Prep Area: Entire Posterior Lumbar Region from lower scapular tip down to mid buttocks area and from flank to flank. Materials:  Tray: Epidural tray Needle(s):  Type: Epidural needle  (Tuohy) Gauge (G):  17 Length: Regular (3.5-in) Qty: 1  Pre-op H&P Assessment:  Anthony [name] is a 86 y.o. (year old), male patient, seen today for interventional treatment. He  has a past surgical history that includes Percutaneous coronary stent intervention (pci-s) (2013); Replacement total knee bilateral (Bilateral, 2000s); Spinal cord stimulator implant (06/2016); Nasal sinus surgery; A-FLUTTER ABLATION (N/A, 05/07/2017); Radiofrequency ablation (06/2017); Colonoscopy (2007); Cardiovascular stress test (11/2017); Elbow surgery (Right); Laminectomy thoracic spine w/ placement spinal cord stimulator (08/2018); Spinal cord stimulator insertion (N/A, 09/09/2018); Spinal cord stimulator insertion (N/A, 09/16/2018); and ENDOVASCULAR REPAIR/STENT GRAFT (N/A, 05/05/2020). Anthony [name] has a current medication list which includes the following prescription(s): acetaminophen, praluent, amlodipine, aspirin ec, vitamin d3, ciclopirox, clopidogrel, diphenhydramine-acetaminophen, docusate sodium, eszopiclone, ezetimibe, finasteride, fluocinonide, furosemide, gabapentin, icosapent ethyl, ketoconazole, ketoconazole, levothyroxine, linzess, lisinopril, melatonin, meloxicam, mometasone, mometasone, multiple vitamins-minerals, multiple vitamins-minerals, mupirocin ointment, naproxen sodium, nitroglycerin, ondansetron, oxycodone-acetaminophen, senna, tamsulosin, tizanidine, and trazodone. His primarily concern today is the Back Pain  Initial Vital Signs:  Pulse/HCG Rate:  (!) 47 ECG Heart Rate: (!) 110 Temp: 98.1 F (36.7 C) Resp: 16 BP: 104/61 SpO2: 95 %  BMI: Estimated body mass index is 36.62 kg/m as calculated from the following:   Height as of this encounter: 6' (1.829 m).   Weight as of this encounter: 270 lb (122.5 kg).  Risk Assessment: Allergies: Reviewed. He is allergic to [place] [cholestyramine], atorvastatin, gemfibrozil, metformin and related, and rosuvastatin.  Allergy Precautions: None  required Coagulopathies: Reviewed. None identified.  Blood-thinner therapy: None at this time Active Infection(s): Reviewed. None identified. Anthony [name] is afebrile  Site Confirmation: Anthony [name] was asked to confirm the procedure and laterality before marking the site Procedure checklist: Completed Consent: Before the procedure and under the influence of  no sedative(s), amnesic(s), or anxiolytics, the patient was informed of the treatment options, risks and possible complications. To fulfill our ethical and legal obligations, as recommended by the American Medical Association's Code of Ethics, I have informed the patient of my clinical impression; the nature and purpose of the treatment or procedure; the risks, benefits, and possible complications of the intervention; the alternatives, including doing nothing; the risk(s) and benefit(s) of the alternative treatment(s) or procedure(s); and the risk(s) and benefit(s) of doing nothing. The patient was provided information about the general risks and possible complications associated with the procedure. These may include, but are not limited to: failure to achieve desired goals, infection, bleeding, organ or nerve damage, allergic reactions, paralysis, and death. In addition, the patient was informed of those risks and complications associated to Spine-related procedures, such as failure to decrease pain; infection (i.e.: Meningitis, epidural or intraspinal abscess); bleeding (i.e.: epidural hematoma, subarachnoid hemorrhage, or any other type of intraspinal or peri-dural bleeding); organ or nerve damage (i.e.: Any type of peripheral nerve, nerve root, or spinal cord injury) with subsequent damage to sensory, motor, and/or autonomic systems, resulting in permanent pain, numbness, and/or weakness of one or several areas of the body; allergic reactions; (i.e.: anaphylactic reaction); and/or death. Furthermore, the patient was informed of those risks and  complications associated with the medications. These include, but are not limited to: allergic reactions (i.e.: anaphylactic or anaphylactoid reaction(s)); adrenal axis suppression; blood sugar elevation that in diabetics may result in ketoacidosis or comma; water retention that in patients with history of congestive heart failure may result in shortness of breath, pulmonary edema, and decompensation with resultant heart failure; weight gain; swelling or edema; medication-induced neural toxicity; particulate matter embolism and blood vessel occlusion with resultant organ, and/or nervous system infarction; and/or aseptic necrosis of one or more joints. Finally, the patient was informed that Medicine is not an exact science; therefore, there is also the possibility of unforeseen or unpredictable risks and/or possible complications that may result in a catastrophic outcome. The patient indicated having understood very clearly. We have given the patient no guarantees and we have made no promises. Enough time was given to the patient to ask questions, all of which were answered to the patient's satisfaction. Anthony [name] has indicated that he wanted to continue with the procedure. Attestation: I, the ordering provider, attest that I have discussed with the patient the benefits, risks, side-effects, alternatives, likelihood of achieving goals, and potential problems during recovery for the procedure that I have provided informed consent. Date  Time: 05/17/2022  1:14 PM  Pre-Procedure Preparation:  Monitoring: As per clinic protocol. Respiration, ETCO2, SpO2, BP, heart rate and rhythm monitor placed and checked for adequate function Safety Precautions: Patient was assessed for positional comfort and pressure points before starting the procedure. Time-out: I initiated and conducted the "Time-out" before starting the procedure, as per protocol. The patient was asked to participate by confirming the accuracy of the  "Time Out" information. Verification of the correct person, site, and procedure were performed and confirmed by me, the nursing staff, and the patient. "Time-out" conducted as per Joint Commission's Universal Protocol (UP.01.01.01). Time: 6734  Description/Narrative of Procedure:          Target: Epidural space via interlaminar opening, initially targeting the lower laminar border of the superior vertebral body. Region: Lumbar Approach: Percutaneous paravertebral  Rationale (medical necessity): procedure needed and proper for the diagnosis and/or treatment of the patient's medical symptoms and needs. Procedural Technique Safety Precautions: Aspiration  looking for blood return was conducted prior to all injections. At no point did we inject any substances, as a needle was being advanced. No attempts were made at seeking any paresthesias. Safe injection practices and needle disposal techniques used. Medications properly checked for expiration dates. SDV (single dose vial) medications used. Description of the Procedure: Protocol guidelines were followed. The procedure needle was introduced through the skin, ipsilateral to the reported pain, and advanced to the target area. Bone was contacted and the needle walked caudad, until the lamina was cleared. The epidural space was identified using "loss-of-resistance technique" with 2-3 ml of PF-NaCl (0.9% NSS), in a 5cc LOR glass syringe.  6 cc solution made of 3 cc of preservative-free saline, 2 cc of 0.2% ropivacaine, 1 cc of Decadron 10 mg/cc.   Vitals:   05/17/22 1336 05/17/22 1341 05/17/22 1350 05/17/22 1403  BP: 138/89 (!) 130/100 (!) 139/106 (!) 129/97  Pulse:      Resp: '18 18 18 18  '$ Temp:      SpO2: 95% 95% 95% 98%  Weight:      Height:        Start Time: 1349 hrs. End Time: 1355 hrs.  Imaging Guidance (Spinal):          Type of Imaging Technique: Fluoroscopy Guidance (Spinal) Indication(s): Assistance in needle guidance and placement  for procedures requiring needle placement in or near specific anatomical locations not easily accessible without such assistance. Exposure Time: Please see nurses notes. Contrast: Before injecting any contrast, we confirmed that the patient did not have an allergy to iodine, shellfish, or radiological contrast. Once satisfactory needle placement was completed at the desired level, radiological contrast was injected. Contrast injected under live fluoroscopy. No contrast complications. See chart for type and volume of contrast used. Fluoroscopic Guidance: I was personally present during the use of fluoroscopy. "Tunnel Vision Technique" used to obtain the best possible view of the target area. Parallax error corrected before commencing the procedure. "Direction-depth-direction" technique used to introduce the needle under continuous pulsed fluoroscopy. Once target was reached, antero-posterior, oblique, and lateral fluoroscopic projection used confirm needle placement in all planes. Images permanently stored in EMR. Interpretation: I personally interpreted the imaging intraoperatively. Adequate needle placement confirmed in multiple planes. Appropriate spread of contrast into desired area was observed. No evidence of afferent or efferent intravascular uptake. No intrathecal or subarachnoid spread observed. Permanent images saved into the patient's record.  Antibiotic Prophylaxis:   Anti-infectives (From admission, onward)    None      Indication(s): None identified  Post-operative Assessment:  Post-procedure Vital Signs:  Pulse/HCG Rate:  (!) 4798 Temp: 98.1 F (36.7 C) Resp: 18 BP:  (!) 129/97 SpO2: 98 %  EBL: None  Complications: No immediate post-treatment complications observed by team, or reported by patient.  Note: The patient tolerated the entire procedure well. A repeat set of vitals were taken after the procedure and the patient was kept under observation following institutional  policy, for this type of procedure. Post-procedural neurological assessment was performed, showing return to baseline, prior to discharge. The patient was provided with post-procedure discharge instructions, including a section on how to identify potential problems. Should any problems arise concerning this procedure, the patient was given instructions to immediately contact us, at any time, without hesitation. In any case, we plan to contact the patient by telephone for a follow-up status report regarding this interventional procedure.  Comments:  No additional relevant information.  Plan of Care  Orders:  Orders  Placed This Encounter  Procedures   DG PAIN CLINIC C-ARM 1-60 MIN NO REPORT    Intraoperative interpretation by procedural physician at [place].    Standing Status:   Standing    Number of Occurrences:   1    Order Specific Question:   Reason for exam:    Answer:   Assistance in needle guidance and placement for procedures requiring needle placement in or near specific anatomical locations not easily accessible without such assistance.    Medications ordered for procedure: Meds ordered this encounter  Medications   iohexol (OMNIPAQUE) 180 MG/ML injection 10 mL    Must be Myelogram-compatible. If not available, you may substitute with a water-soluble, non-ionic, hypoallergenic, myelogram-compatible radiological contrast medium.   lidocaine (XYLOCAINE) 2 % (with pres) injection 400 mg   sodium chloride flush (NS) 0.9 % injection 2 mL   ropivacaine (PF) 2 mg/mL (0.2%) (NAROPIN) injection 2 mL   dexamethasone (DECADRON) injection 10 mg   Medications administered: We administered iohexol, lidocaine, sodium chloride flush, ropivacaine (PF) 2 mg/mL (0.2%), and dexamethasone.  See the medical record for exact dosing, route, and time of administration.  Follow-up plan:   Return in about 4 weeks (around 06/14/2022) for Post Procedure Evaluation, virtual.     Recent  Visits Date Type Provider Dept  05/08/22 Office Visit Gillis Santa, MD [facility]  04/10/22 Procedure visit Gillis Santa, MD [facility]  03/29/22 Office Visit Gillis Santa, MD [facility]  Showing recent visits within past 90 days and meeting all other requirements Today's Visits Date Type Provider Dept  05/17/22 Procedure visit Gillis Santa, MD [facility]  Showing today's visits and meeting all other requirements Future Appointments Date Type Provider Dept  06/13/22 Appointment Gillis Santa, MD [facility]  Showing future appointments within next 90 days and meeting all other requirements  Disposition: Discharge home  Discharge (Date  Time): 05/17/2022; 1405 hrs.   Primary Care Physician: Ria Bush, MD Location: [facility] Note by: Gillis Santa, MD Date: 05/17/2022; Time: 2:55 PM  Disclaimer:  Medicine is not an exact science. The only guarantee in medicine is that nothing is guaranteed. It is important to note that the decision to proceed with this intervention was based on the information collected from the patient. The Data and conclusions were drawn from the patient's questionnaire, the interview, and the physical examination. Because the information was provided in large part by the patient, it cannot be guaranteed that it has not been purposely or unconsciously manipulated. Every effort has been made to obtain as much relevant data as possible for this evaluation. It is important to note that the conclusions that lead to this procedure are derived in large part from the available data. Always take into account that the treatment will also be dependent on availability of resources and existing treatment guidelines, considered by other Pain Management Practitioners as being common knowledge and practice, at the time of the intervention. For Medico-Legal purposes, it is also important to point  out that variation in procedural techniques and pharmacological choices are the acceptable norm. The indications, contraindications, technique, and results of the above procedure should only be interpreted and judged by a Board-Certified Interventional Pain Specialist with extensive familiarity and expertise in the same exact procedure and technique.

## 2022-05-17 NOTE — Patient Instructions (Signed)
____________________________________________________________________________________________  Post-Procedure Discharge Instructions  Instructions: Apply ice:  Purpose: This will minimize any swelling and discomfort after procedure.  When: Day of procedure, as soon as you get home. How: Fill a plastic sandwich bag with crushed ice. Cover it with a small towel and apply to injection site. How long: (15 min on, 15 min off) Apply for 15 minutes then remove x 15 minutes.  Repeat sequence on day of procedure, until you go to bed. Apply heat:  Purpose: To treat any soreness and discomfort from the procedure. When: Starting the next day after the procedure. How: Apply heat to procedure site starting the day following the procedure. How long: May continue to repeat daily, until discomfort goes away. Food intake: Start with clear liquids (like water) and advance to regular food, as tolerated.  Physical activities: Keep activities to a minimum for the first 8 hours after the procedure. After that, then as tolerated. Driving: If you have received any sedation, be responsible and do not drive. You are not allowed to drive for 24 hours after having sedation. Blood thinner: (Applies only to those taking blood thinners) You may restart your blood thinner 6 hours after your procedure. Insulin: (Applies only to Diabetic patients taking insulin) As soon as you can eat, you may resume your normal dosing schedule. Infection prevention: Keep procedure site clean and dry. Shower daily and clean area with soap and water. Post-procedure Pain Diary: Extremely important that this be done correctly and accurately. Recorded information will be used to determine the next step in treatment. For the purpose of accuracy, follow these rules: Evaluate only the area treated. Do not report or include pain from an untreated area. For the purpose of this evaluation, ignore all other areas of pain, except for the treated area. After  your procedure, avoid taking a long nap and attempting to complete the pain diary after you wake up. Instead, set your alarm clock to go off every hour, on the hour, for the initial 8 hours after the procedure. Document the duration of the numbing medicine, and the relief you are getting from it. Do not go to sleep and attempt to complete it later. It will not be accurate. If you received sedation, it is likely that you were given a medication that may cause amnesia. Because of this, completing the diary at a later time may cause the information to be inaccurate. This information is needed to plan your care. Follow-up appointment: Keep your post-procedure follow-up evaluation appointment after the procedure (usually 2 weeks for most procedures, 6 weeks for radiofrequencies). DO NOT FORGET to bring you pain diary with you.   Expect: (What should I expect to see with my procedure?) From numbing medicine (AKA: Local Anesthetics): Numbness or decrease in pain. You may also experience some weakness, which if present, could last for the duration of the local anesthetic. Onset: Full effect within 15 minutes of injected. Duration: It will depend on the type of local anesthetic used. On the average, 1 to 8 hours.  From steroids (Applies only if steroids were used): Decrease in swelling or inflammation. Once inflammation is improved, relief of the pain will follow. Onset of benefits: Depends on the amount of swelling present. The more swelling, the longer it will take for the benefits to be seen. In some cases, up to 10 days. Duration: Steroids will stay in the system x 2 weeks. Duration of benefits will depend on multiple posibilities including persistent irritating factors. Side-effects: If present, they  may typically last 2 weeks (the duration of the steroids). Frequent: Cramps (if they occur, drink Gatorade and take over-the-counter Magnesium 450-500 mg once to twice a day); water retention with  temporary weight gain; increases in blood sugar; decreased immune system response; increased appetite. Occasional: Facial flushing (red, warm cheeks); mood swings; menstrual changes. Uncommon: Long-term decrease or suppression of natural hormones; bone thinning. (These are more common with higher doses or more frequent use. This is why we prefer that our patients avoid having any injection therapies in other practices.)  Very Rare: Severe mood changes; psychosis; aseptic necrosis. From procedure: Some discomfort is to be expected once the numbing medicine wears off. This should be minimal if ice and heat are applied as instructed.  Call if: (When should I call?) You experience numbness and weakness that gets worse with time, as opposed to wearing off. New onset bowel or bladder incontinence. (Applies only to procedures done in the spine)  Emergency Numbers: Durning business hours (Monday - Thursday, 8:00 AM - 4:00 PM) (Friday, 9:00 AM - 12:00 Noon): [phone] After hours: [phone] NOTE: If you are having a problem and are unable connect with, or to talk to a provider, then go to your nearest urgent care or emergency department. If the problem is serious and urgent, please call 911. ____________________________________________________________________________________________   Epidural Steroid Injection Patient Information  Description: The epidural space surrounds the nerves as they exit the spinal cord.  In some patients, the nerves can be compressed and inflamed by a bulging disc or a tight spinal canal (spinal stenosis).  By injecting steroids into the epidural space, we can bring irritated nerves into direct contact with a potentially helpful medication.  These steroids act directly on the irritated nerves and can reduce swelling and inflammation which often leads to decreased pain.  Epidural steroids may be injected anywhere along the spine and from the neck to the low back  depending upon the location of your pain.   After numbing the skin with local anesthetic (like Novocaine), a small needle is passed into the epidural space slowly.  You may experience a sensation of pressure while this is being done.  The entire block usually last less than 10 minutes.  Conditions which may be treated by epidural steroids:  Low back and leg pain Neck and arm pain Spinal stenosis Post-laminectomy syndrome Herpes zoster (shingles) pain Pain from compression fractures  Preparation for the injection:  Do not eat any solid food or dairy products within 8 hours of your appointment.  You may drink clear liquids up to 3 hours before appointment.  Clear liquids include water, black coffee, juice or soda.  No milk or cream please. You may take your regular medication, including pain medications, with a sip of water before your appointment  Diabetics should hold regular insulin (if taken separately) and take 1/2 normal NPH dos the morning of the procedure.  Carry some sugar containing items with you to your appointment. A driver must accompany you and be prepared to drive you home after your procedure.  Bring all your current medications with your. An IV may be inserted and sedation may be given at the discretion of the physician.   A blood pressure cuff, EKG and other monitors will often be applied during the procedure.  Some patients may need to have extra oxygen administered for a short period. You will be asked to provide medical information, including your allergies, prior to the procedure.  We must know   immediately if you are taking blood thinners (like Coumadin/Warfarin)  Or if you are allergic to IV iodine contrast (dye). We must know if you could possible be pregnant.  Possible side-effects: Bleeding from needle site Infection (rare, may require surgery) Nerve injury (rare) Numbness & tingling (temporary) Difficulty urinating (rare, temporary) Spinal headache ( a headache  worse with upright posture) Light -headedness (temporary) Pain at injection site (several days) Decreased blood pressure (temporary) Weakness in arm/leg (temporary) Pressure sensation in back/neck (temporary)  Call if you experience: Fever/chills associated with headache or increased back/neck pain. Headache worsened by an upright position. New onset weakness or numbness of an extremity below the injection site Hives or difficulty breathing (go to the emergency room) Inflammation or drainage at the infection site Severe back/neck pain Any new symptoms which are concerning to you  Please note:  Although the local anesthetic injected can often make your back or neck feel good for several hours after the injection, the pain will likely return.  It takes 3-7 days for steroids to work in the epidural space.  You may not notice any pain relief for at least that one week.  If effective, we will often do a series of three injections spaced 3-6 weeks apart to maximally decrease your pain.  After the initial series, we generally will wait several months before considering a repeat injection of the same type.  If you have any questions, please call [phone] [facility] 

## 2022-05-18 ENCOUNTER — Telehealth: Payer: Self-pay

## 2022-05-18 DIAGNOSIS — F5104 Psychophysiologic insomnia: Secondary | ICD-10-CM

## 2022-05-18 MED ORDER — TRAZODONE HCL 50 MG PO TABS: 50.0000 mg | ORAL_TABLET | Freq: Every evening | ORAL | 2 refills | Status: DC | PRN

## 2022-05-18 NOTE — Telephone Encounter (Signed)
E-scribed refill 

## 2022-05-18 NOTE — Telephone Encounter (Signed)
Post procedure phone call follow up.  Patient states he is doping well.

## 2022-05-22 ENCOUNTER — Inpatient Hospital Stay
Admission: EM | Admit: 2022-05-22 | Discharge: 2022-06-08 | DRG: 853 | Disposition: A | Discharge status: Skilled Nursing Facility | Payer: Medicare Other | Attending: Obstetrics and Gynecology | Admitting: Obstetrics and Gynecology

## 2022-05-22 ENCOUNTER — Emergency Department: Payer: Medicare Other

## 2022-05-22 DIAGNOSIS — Z743 Need for continuous supervision: Secondary | ICD-10-CM | POA: Diagnosis not present

## 2022-05-22 DIAGNOSIS — M4627 Osteomyelitis of vertebra, lumbosacral region: Secondary | ICD-10-CM | POA: Diagnosis present

## 2022-05-22 DIAGNOSIS — I5032 Chronic diastolic (congestive) heart failure: Secondary | ICD-10-CM | POA: Diagnosis present

## 2022-05-22 DIAGNOSIS — E876 Hypokalemia: Secondary | ICD-10-CM | POA: Diagnosis present

## 2022-05-22 DIAGNOSIS — N1832 Chronic kidney disease, stage 3b: Secondary | ICD-10-CM | POA: Diagnosis present

## 2022-05-22 DIAGNOSIS — M6259 Muscle wasting and atrophy, not elsewhere classified, multiple sites: Secondary | ICD-10-CM | POA: Diagnosis not present

## 2022-05-22 DIAGNOSIS — Z79899 Other long term (current) drug therapy: Secondary | ICD-10-CM

## 2022-05-22 DIAGNOSIS — M4656 Other infective spondylopathies, lumbar region: Secondary | ICD-10-CM | POA: Diagnosis present

## 2022-05-22 DIAGNOSIS — I4892 Unspecified atrial flutter: Secondary | ICD-10-CM | POA: Diagnosis present

## 2022-05-22 DIAGNOSIS — K254 Chronic or unspecified gastric ulcer with hemorrhage: Secondary | ICD-10-CM | POA: Diagnosis not present

## 2022-05-22 DIAGNOSIS — N400 Enlarged prostate without lower urinary tract symptoms: Secondary | ICD-10-CM | POA: Diagnosis present

## 2022-05-22 DIAGNOSIS — Z66 Do not resuscitate: Secondary | ICD-10-CM | POA: Diagnosis present

## 2022-05-22 DIAGNOSIS — R531 Weakness: Secondary | ICD-10-CM

## 2022-05-22 DIAGNOSIS — R140 Abdominal distension (gaseous): Secondary | ICD-10-CM | POA: Diagnosis not present

## 2022-05-22 DIAGNOSIS — B957 Other staphylococcus as the cause of diseases classified elsewhere: Secondary | ICD-10-CM | POA: Diagnosis not present

## 2022-05-22 DIAGNOSIS — A419 Sepsis, unspecified organism: Secondary | ICD-10-CM | POA: Diagnosis present

## 2022-05-22 DIAGNOSIS — Z955 Presence of coronary angioplasty implant and graft: Secondary | ICD-10-CM

## 2022-05-22 DIAGNOSIS — N189 Chronic kidney disease, unspecified: Secondary | ICD-10-CM | POA: Insufficient documentation

## 2022-05-22 DIAGNOSIS — K264 Chronic or unspecified duodenal ulcer with hemorrhage: Secondary | ICD-10-CM | POA: Diagnosis not present

## 2022-05-22 DIAGNOSIS — R2689 Other abnormalities of gait and mobility: Secondary | ICD-10-CM | POA: Diagnosis not present

## 2022-05-22 DIAGNOSIS — M5442 Lumbago with sciatica, left side: Secondary | ICD-10-CM | POA: Diagnosis not present

## 2022-05-22 DIAGNOSIS — G8929 Other chronic pain: Secondary | ICD-10-CM | POA: Diagnosis present

## 2022-05-22 DIAGNOSIS — Z85828 Personal history of other malignant neoplasm of skin: Secondary | ICD-10-CM

## 2022-05-22 DIAGNOSIS — R338 Other retention of urine: Secondary | ICD-10-CM

## 2022-05-22 DIAGNOSIS — K921 Melena: Secondary | ICD-10-CM | POA: Diagnosis not present

## 2022-05-22 DIAGNOSIS — I130 Hypertensive heart and chronic kidney disease with heart failure and stage 1 through stage 4 chronic kidney disease, or unspecified chronic kidney disease: Secondary | ICD-10-CM | POA: Diagnosis not present

## 2022-05-22 DIAGNOSIS — E785 Hyperlipidemia, unspecified: Secondary | ICD-10-CM | POA: Diagnosis present

## 2022-05-22 DIAGNOSIS — N179 Acute kidney failure, unspecified: Secondary | ICD-10-CM | POA: Diagnosis present

## 2022-05-22 DIAGNOSIS — Z741 Need for assistance with personal care: Secondary | ICD-10-CM | POA: Diagnosis not present

## 2022-05-22 DIAGNOSIS — N4289 Other specified disorders of prostate: Secondary | ICD-10-CM | POA: Diagnosis not present

## 2022-05-22 DIAGNOSIS — T85192S Other mechanical complication of implanted electronic neurostimulator (electrode) of spinal cord, sequela: Secondary | ICD-10-CM | POA: Diagnosis not present

## 2022-05-22 DIAGNOSIS — K5900 Constipation, unspecified: Secondary | ICD-10-CM | POA: Diagnosis present

## 2022-05-22 DIAGNOSIS — N390 Urinary tract infection, site not specified: Secondary | ICD-10-CM | POA: Diagnosis present

## 2022-05-22 DIAGNOSIS — R6520 Severe sepsis without septic shock: Secondary | ICD-10-CM | POA: Diagnosis not present

## 2022-05-22 DIAGNOSIS — Z9682 Presence of neurostimulator: Secondary | ICD-10-CM

## 2022-05-22 DIAGNOSIS — I5030 Unspecified diastolic (congestive) heart failure: Secondary | ICD-10-CM | POA: Diagnosis not present

## 2022-05-22 DIAGNOSIS — M5136 Other intervertebral disc degeneration, lumbar region: Secondary | ICD-10-CM | POA: Diagnosis not present

## 2022-05-22 DIAGNOSIS — T85192A Other mechanical complication of implanted electronic neurostimulator (electrode) of spinal cord, initial encounter: Secondary | ICD-10-CM | POA: Diagnosis not present

## 2022-05-22 DIAGNOSIS — A411 Sepsis due to other specified staphylococcus: Secondary | ICD-10-CM | POA: Diagnosis present

## 2022-05-22 DIAGNOSIS — K8020 Calculus of gallbladder without cholecystitis without obstruction: Secondary | ICD-10-CM | POA: Diagnosis not present

## 2022-05-22 DIAGNOSIS — Z96653 Presence of artificial knee joint, bilateral: Secondary | ICD-10-CM | POA: Diagnosis present

## 2022-05-22 DIAGNOSIS — N419 Inflammatory disease of prostate, unspecified: Secondary | ICD-10-CM | POA: Diagnosis not present

## 2022-05-22 DIAGNOSIS — D62 Acute posthemorrhagic anemia: Secondary | ICD-10-CM | POA: Diagnosis not present

## 2022-05-22 DIAGNOSIS — M4620 Osteomyelitis of vertebra, site unspecified: Secondary | ICD-10-CM | POA: Diagnosis not present

## 2022-05-22 DIAGNOSIS — R0902 Hypoxemia: Secondary | ICD-10-CM | POA: Diagnosis not present

## 2022-05-22 DIAGNOSIS — G4700 Insomnia, unspecified: Secondary | ICD-10-CM | POA: Diagnosis not present

## 2022-05-22 DIAGNOSIS — R7881 Bacteremia: Secondary | ICD-10-CM | POA: Diagnosis not present

## 2022-05-22 DIAGNOSIS — I2489 Other forms of acute ischemic heart disease: Secondary | ICD-10-CM | POA: Diagnosis present

## 2022-05-22 DIAGNOSIS — M4316 Spondylolisthesis, lumbar region: Secondary | ICD-10-CM | POA: Diagnosis not present

## 2022-05-22 DIAGNOSIS — R279 Unspecified lack of coordination: Secondary | ICD-10-CM | POA: Diagnosis not present

## 2022-05-22 DIAGNOSIS — Z4542 Encounter for adjustment and management of neuropacemaker (brain) (peripheral nerve) (spinal cord): Secondary | ICD-10-CM | POA: Diagnosis not present

## 2022-05-22 DIAGNOSIS — Z6835 Body mass index (BMI) 35.0-35.9, adult: Secondary | ICD-10-CM | POA: Diagnosis not present

## 2022-05-22 DIAGNOSIS — R000 Tachycardia, unspecified: Secondary | ICD-10-CM | POA: Diagnosis not present

## 2022-05-22 DIAGNOSIS — D72829 Elevated white blood cell count, unspecified: Secondary | ICD-10-CM | POA: Diagnosis not present

## 2022-05-22 DIAGNOSIS — R4182 Altered mental status, unspecified: Secondary | ICD-10-CM | POA: Diagnosis not present

## 2022-05-22 DIAGNOSIS — Z823 Family history of stroke: Secondary | ICD-10-CM

## 2022-05-22 DIAGNOSIS — N3289 Other specified disorders of bladder: Secondary | ICD-10-CM | POA: Diagnosis not present

## 2022-05-22 DIAGNOSIS — G4733 Obstructive sleep apnea (adult) (pediatric): Secondary | ICD-10-CM | POA: Diagnosis not present

## 2022-05-22 DIAGNOSIS — M4647 Discitis, unspecified, lumbosacral region: Secondary | ICD-10-CM | POA: Diagnosis present

## 2022-05-22 DIAGNOSIS — Z806 Family history of leukemia: Secondary | ICD-10-CM

## 2022-05-22 DIAGNOSIS — Z736 Limitation of activities due to disability: Secondary | ICD-10-CM | POA: Diagnosis not present

## 2022-05-22 DIAGNOSIS — I4891 Unspecified atrial fibrillation: Secondary | ICD-10-CM | POA: Diagnosis present

## 2022-05-22 DIAGNOSIS — I10 Essential (primary) hypertension: Secondary | ICD-10-CM | POA: Diagnosis not present

## 2022-05-22 DIAGNOSIS — K274 Chronic or unspecified peptic ulcer, site unspecified, with hemorrhage: Secondary | ICD-10-CM | POA: Diagnosis not present

## 2022-05-22 DIAGNOSIS — Z7982 Long term (current) use of aspirin: Secondary | ICD-10-CM

## 2022-05-22 DIAGNOSIS — I2510 Atherosclerotic heart disease of native coronary artery without angina pectoris: Secondary | ICD-10-CM | POA: Diagnosis present

## 2022-05-22 DIAGNOSIS — Z981 Arthrodesis status: Secondary | ICD-10-CM | POA: Diagnosis not present

## 2022-05-22 DIAGNOSIS — J99 Respiratory disorders in diseases classified elsewhere: Secondary | ICD-10-CM | POA: Diagnosis not present

## 2022-05-22 DIAGNOSIS — T85890A Other specified complication of nervous system prosthetic devices, implants and grafts, initial encounter: Secondary | ICD-10-CM | POA: Diagnosis present

## 2022-05-22 DIAGNOSIS — Z7902 Long term (current) use of antithrombotics/antiplatelets: Secondary | ICD-10-CM

## 2022-05-22 DIAGNOSIS — G061 Intraspinal abscess and granuloma: Secondary | ICD-10-CM | POA: Diagnosis present

## 2022-05-22 DIAGNOSIS — Y831 Surgical operation with implant of artificial internal device as the cause of abnormal reaction of the patient, or of later complication, without mention of misadventure at the time of the procedure: Secondary | ICD-10-CM | POA: Diagnosis present

## 2022-05-22 DIAGNOSIS — E039 Hypothyroidism, unspecified: Secondary | ICD-10-CM | POA: Diagnosis present

## 2022-05-22 DIAGNOSIS — M5441 Lumbago with sciatica, right side: Secondary | ICD-10-CM | POA: Diagnosis not present

## 2022-05-22 DIAGNOSIS — I509 Heart failure, unspecified: Secondary | ICD-10-CM | POA: Diagnosis not present

## 2022-05-22 DIAGNOSIS — N183 Chronic kidney disease, stage 3 unspecified: Secondary | ICD-10-CM | POA: Diagnosis not present

## 2022-05-22 DIAGNOSIS — M488X6 Other specified spondylopathies, lumbar region: Secondary | ICD-10-CM | POA: Diagnosis not present

## 2022-05-22 DIAGNOSIS — Z79890 Hormone replacement therapy (postmenopausal): Secondary | ICD-10-CM

## 2022-05-22 DIAGNOSIS — E860 Dehydration: Secondary | ICD-10-CM | POA: Diagnosis present

## 2022-05-22 DIAGNOSIS — T85112A Breakdown (mechanical) of implanted electronic neurostimulator (electrode) of spinal cord, initial encounter: Secondary | ICD-10-CM | POA: Diagnosis not present

## 2022-05-22 DIAGNOSIS — W19XXXA Unspecified fall, initial encounter: Secondary | ICD-10-CM | POA: Diagnosis not present

## 2022-05-22 DIAGNOSIS — D75838 Other thrombocytosis: Secondary | ICD-10-CM | POA: Diagnosis not present

## 2022-05-22 DIAGNOSIS — M4807 Spinal stenosis, lumbosacral region: Secondary | ICD-10-CM | POA: Diagnosis present

## 2022-05-22 LAB — BASIC METABOLIC PANEL
Anion gap: 10 (ref 5–15)
BUN: 67 mg/dL — ABNORMAL HIGH (ref 8–23)
CO2: 26 mmol/L (ref 22–32)
Calcium: 10.2 mg/dL (ref 8.9–10.3)
Chloride: 105 mmol/L (ref 98–111)
Creatinine, Ser: 2.62 mg/dL — ABNORMAL HIGH (ref 0.61–1.24)
GFR, Estimated: 23 mL/min — ABNORMAL LOW (ref >60)
Glucose, Bld: 156 mg/dL — ABNORMAL HIGH (ref 70–99)
Potassium: 3.9 mmol/L (ref 3.5–5.1)
Sodium: 141 mmol/L (ref 135–145)

## 2022-05-22 LAB — URINALYSIS, ROUTINE W REFLEX MICROSCOPIC
Bilirubin Urine: NEGATIVE
Glucose, UA: NEGATIVE mg/dL
Hgb urine dipstick: NEGATIVE
Ketones, ur: NEGATIVE mg/dL
Nitrite: NEGATIVE
Protein, ur: NEGATIVE mg/dL
Specific Gravity, Urine: 1.017 (ref 1.005–1.030)
pH: 5 (ref 5.0–8.0)

## 2022-05-22 LAB — CBC
HCT: 36.2 % — ABNORMAL LOW (ref 39.0–52.0)
Hemoglobin: 11.8 g/dL — ABNORMAL LOW (ref 13.0–17.0)
MCH: 31.1 pg (ref 26.0–34.0)
MCHC: 32.6 g/dL (ref 30.0–36.0)
MCV: 95.3 fL (ref 80.0–100.0)
Platelets: 263 10*3/uL (ref 150–400)
RBC: 3.8 MIL/uL — ABNORMAL LOW (ref 4.22–5.81)
RDW: 14.6 % (ref 11.5–15.5)
WBC: 21.6 10*3/uL — ABNORMAL HIGH (ref 4.0–10.5)
nRBC: 0 % (ref 0.0–0.2)

## 2022-05-22 LAB — TROPONIN I (HIGH SENSITIVITY)
Troponin I (High Sensitivity): 131 ng/L (ref <18)
Troponin I (High Sensitivity): 142 ng/L (ref <18)

## 2022-05-22 LAB — SEDIMENTATION RATE: Sed Rate: 10 mm/hr (ref 0–20)

## 2022-05-22 LAB — C-REACTIVE PROTEIN: CRP: 42.2 mg/dL — ABNORMAL HIGH (ref <1.0)

## 2022-05-22 LAB — LACTIC ACID, PLASMA
Lactic Acid, Venous: 1.2 mmol/L (ref 0.5–1.9)
Lactic Acid, Venous: 2 mmol/L (ref 0.5–1.9)

## 2022-05-22 MED ORDER — ENOXAPARIN SODIUM 30 MG/0.3ML IJ SOSY
30.0000 mg | PREFILLED_SYRINGE | Freq: Every day | INTRAMUSCULAR | Status: DC
  Administered 2022-05-23: 30 mg via SUBCUTANEOUS
  Filled 2022-05-22: qty 0.3

## 2022-05-22 MED ORDER — TRAZODONE HCL 50 MG PO TABS
25.0000 mg | ORAL_TABLET | Freq: Every evening | ORAL | Status: DC | PRN
  Administered 2022-05-28 – 2022-06-07 (×9): 25 mg via ORAL
  Filled 2022-05-22 (×10): qty 1

## 2022-05-22 MED ORDER — ONDANSETRON HCL 4 MG PO TABS: 4.0000 mg | ORAL_TABLET | Freq: Four times a day (QID) | ORAL | Status: DC | PRN

## 2022-05-22 MED ORDER — SODIUM CHLORIDE 0.9 % IV SOLN
1.0000 g | INTRAVENOUS | Status: AC
  Administered 2022-05-22: 1 g via INTRAVENOUS
  Filled 2022-05-22: qty 10

## 2022-05-22 MED ORDER — SODIUM CHLORIDE 0.9 % IV SOLN
INTRAVENOUS | Status: DC
  Administered 2022-05-23: 1000 mL via INTRAVENOUS

## 2022-05-22 MED ORDER — ACETAMINOPHEN 650 MG RE SUPP: 650.0000 mg | Freq: Four times a day (QID) | RECTAL | Status: DC | PRN

## 2022-05-22 MED ORDER — FENTANYL CITRATE PF 50 MCG/ML IJ SOSY
50.0000 ug | PREFILLED_SYRINGE | Freq: Once | INTRAMUSCULAR | Status: AC
  Administered 2022-05-22: 50 ug via INTRAVENOUS
  Filled 2022-05-22: qty 1

## 2022-05-22 MED ORDER — SODIUM CHLORIDE 0.9 % IV BOLUS
1000.0000 mL | Freq: Once | INTRAVENOUS | Status: AC
  Administered 2022-05-22: 1000 mL via INTRAVENOUS

## 2022-05-22 MED ORDER — OXYCODONE HCL 5 MG PO TABS
5.0000 mg | ORAL_TABLET | ORAL | Status: DC | PRN
  Administered 2022-05-22 – 2022-06-01 (×15): 5 mg via ORAL
  Filled 2022-05-22 (×16): qty 1

## 2022-05-22 MED ORDER — MORPHINE SULFATE (PF) 2 MG/ML IV SOLN
2.0000 mg | INTRAVENOUS | Status: DC | PRN
  Administered 2022-05-23 – 2022-06-01 (×11): 2 mg via INTRAVENOUS
  Filled 2022-05-22 (×11): qty 1

## 2022-05-22 MED ORDER — ACETAMINOPHEN 325 MG PO TABS
650.0000 mg | ORAL_TABLET | Freq: Four times a day (QID) | ORAL | Status: DC | PRN
  Administered 2022-05-23 – 2022-05-31 (×3): 650 mg via ORAL
  Filled 2022-05-22 (×3): qty 2

## 2022-05-22 MED ORDER — ONDANSETRON HCL 4 MG/2ML IJ SOLN: 4.0000 mg | Freq: Four times a day (QID) | INTRAMUSCULAR | Status: DC | PRN

## 2022-05-22 MED ORDER — SODIUM CHLORIDE 0.9 % IV BOLUS
500.0000 mL | Freq: Once | INTRAVENOUS | Status: AC
  Administered 2022-05-22: 500 mL via INTRAVENOUS

## 2022-05-22 NOTE — ED Notes (Signed)
Family updated as to patient's status. Son on phone with MD Fritzi Mandes at this time

## 2022-05-22 NOTE — ED Notes (Signed)
Patient sitting up on bedside eating dinner tray

## 2022-05-22 NOTE — ED Notes (Signed)
MD Fritzi Mandes at bedside

## 2022-05-22 NOTE — ED Notes (Signed)
Patient refusing to use urinal in bed. Patient using walker to restroom. Appears weak

## 2022-05-22 NOTE — Progress Notes (Signed)
Attempted to get pt for MRI however his generator is dead and needs to be replaced in order to put him in MRI mode so we can safely do the MRI. NP was informed.

## 2022-05-22 NOTE — ED Notes (Signed)
Pt back from MRI 

## 2022-05-22 NOTE — ED Notes (Signed)
Patient assisted with urinal by Lawanda Cousins., RN

## 2022-05-22 NOTE — ED Notes (Signed)
Patient transported to MRI 

## 2022-05-22 NOTE — ED Notes (Signed)
Jerelyn Scott., RN assisted patient to restroom

## 2022-05-22 NOTE — ED Provider Notes (Signed)
[facility] Provider Note    Event Date/Time   First MD Initiated Contact with Patient 05/22/22 1209     (approximate)   History   Back Pain   HPI  Anthony [name] is a 86 y.o. male with history of chronic back pain, a flutter on Plavix, CHF, CAD, chronic low back pain presents to the emergency department for treatment and evaluation of mid low back pain that has been progressively worsening over the past few days with associated bilateral lower extremity weakness.  Patient had a fluorescein guided spinal injection on 05/17/2022.  He states that he felt good on the 21st but developed pain and progressive weakness that seems to be worse on the left.  He has been taking Percocet without significant relief.  He had been able to ambulate with his walker prior to onset of symptoms but now is unable to do so.  Patient states that his legs just "give out."  This caused him to fall onto his buttock today.  He denies striking his head, experiencing loss of consciousness, or neck pain.      Physical Exam   Triage Vital Signs: ED Triage Vitals  Enc Vitals Group     BP 05/22/22 1051 (!) 146/80     Pulse Rate 05/22/22 1051 99     Resp 05/22/22 1051 16     Temp 05/22/22 1049 98.2 F (36.8 C)     Temp Source 05/22/22 1049 Oral     SpO2 05/22/22 1051 92 %     Weight --      Height --      Head Circumference --      Peak Flow --      Pain Score --      Pain Loc --      Pain Edu? --      Excl. in [place]? --     Most recent vital signs: Vitals:   05/22/22 1442 05/22/22 1720  BP: 110/66 121/63  Pulse: (!) 104 (!) 121  Resp: 18 18  Temp:  98.5 F (36.9 C)  SpO2: 92% 95%     General: Awake, no distress.  CV:  Good peripheral perfusion.  Resp:  Normal effort.  Abd:  No distention.  Other:  Skin overlying the lumbar spine is without open wound or lesion.  No erythema in the area of the spinal injection. Equal strength of the lower extremities on exam. Motor and  sensory function is equal of the lower extremities.    ED Results / Procedures / Treatments   Labs (all labs ordered are listed, but only abnormal results are displayed) Labs Reviewed  BASIC METABOLIC PANEL - Abnormal; Notable for the following components:      Result Value   Glucose, Bld 156 (*)    BUN 67 (*)    Creatinine, Ser 2.62 (*)    GFR, Estimated 23 (*)    All other components within normal limits  CBC - Abnormal; Notable for the following components:   WBC 21.6 (*)    RBC 3.80 (*)    Hemoglobin 11.8 (*)    HCT 36.2 (*)    All other components within normal limits  URINALYSIS, ROUTINE W REFLEX MICROSCOPIC - Abnormal; Notable for the following components:   Color, Urine YELLOW (*)    APPearance HAZY (*)    Leukocytes,Ua TRACE (*)    Bacteria, UA RARE (*)    All other components within normal limits  LACTIC  ACID, PLASMA - Abnormal; Notable for the following components:   Lactic Acid, Venous 2.0 (*)    All other components within normal limits  TROPONIN I (HIGH SENSITIVITY) - Abnormal; Notable for the following components:   Troponin I (High Sensitivity) 131 (*)    All other components within normal limits  TROPONIN I (HIGH SENSITIVITY) - Abnormal; Notable for the following components:   Troponin I (High Sensitivity) 142 (*)    All other components within normal limits  CULTURE, BLOOD (ROUTINE X 2)  CULTURE, BLOOD (ROUTINE X 2)  URINE CULTURE  LACTIC ACID, PLASMA  SEDIMENTATION RATE  C-REACTIVE PROTEIN  CBC  CREATININE, SERUM  BASIC METABOLIC PANEL  CBC     EKG  Sinus tachycardia with a rate of 105.   RADIOLOGY  CT of the abdomen pelvis for eval is negative for acute findings.  CT of the lumbar spine shows multilevel severe degenerative changes throughout the lumbar spine with severe spinal canal stenosis from L1-L5.  No evidence of acute osseous trauma in the lumbar spine.   PROCEDURES:  Critical Care performed: No  Procedures   MEDICATIONS  ORDERED IN ED: Medications  cefTRIAXone (ROCEPHIN) 1 g in sodium chloride 0.9 % 100 mL IVPB (1 g Intravenous New Bag/Given 05/22/22 1729)  enoxaparin (LOVENOX) injection 30 mg (has no administration in time range)  0.9 %  sodium chloride infusion (has no administration in time range)  acetaminophen (TYLENOL) tablet 650 mg (has no administration in time range)    Or  acetaminophen (TYLENOL) suppository 650 mg (has no administration in time range)  oxyCODONE (Oxy IR/ROXICODONE) immediate release tablet 5 mg (has no administration in time range)  traZODone (DESYREL) tablet 25 mg (has no administration in time range)  ondansetron (ZOFRAN) tablet 4 mg (has no administration in time range)    Or  ondansetron (ZOFRAN) injection 4 mg (has no administration in time range)  fentaNYL (SUBLIMAZE) injection 50 mcg (50 mcg Intravenous Given 05/22/22 1309)  sodium chloride 0.9 % bolus 500 mL (0 mLs Intravenous Stopped 05/22/22 1541)  sodium chloride 0.9 % bolus 1,000 mL (1,000 mLs Intravenous New Bag/Given 05/22/22 1729)     IMPRESSION / MDM / ASSESSMENT AND PLAN / ED COURSE  I reviewed the triage vital signs and the nursing notes.                              Differential diagnosis includes, but is not limited to, epidural abscess, acute on chronic back pain, sepsis, acute cystitis, intra-abdominal pathology, steroid-induced leukocytosis  Patient's presentation is most consistent with acute illness / injury with system symptoms.  86 year old male presenting to the emergency department for treatment and evaluation of progressive low back pain with bilateral lower extremity weakness.  See HPI for further details.  While awaiting ER room assignment, labs were drawn.  Leukocytosis noted of 21.6, BM P shows BUN of 67 with a creatinine of 2.62 and GFR of 23.  Concern for acute kidney injury based on lab comparisons from 3 weeks ago when BUN, creatinine, and GFR were within normal range.  Leukocytosis may be  related to steroid injection, however lactic acid and blood culture specimens have been requested as well.  Awaiting urinalysis.  Urinalysis shows trace leukocytes and rare bacteria but otherwise not indicative of urosepsis.  Patient reports urinary frequency but denies dysuria.  Initial lactic is normal.  Troponin is 131 which is likely reflective of kidney function.  Clinical Course as of 05/22/22 1748  Mon May 22, 2022  1636 Lactate now elevated to 2.0.  500 mL of normal saline has been ordered initially due to AKI. Additional fluids ordered plus antibiotics.  [CT]  1647  500 mL of normal saline has been ordered initially due to AKI. Additional fluids ordered plus antibiotics. No clear source of infection. Awaiting reading of the LS CT reconstructed from CT abdomen and pelvis.   [CT]    Clinical Course User Index [CT] Hadyn Blanck B, FNP    Patient to be admitted for further evaluation and treatment of back pain, weakness, acute kidney injury, and questionable sepsis.  Case discussed with Dr. Fritzi Mandes who will come see the patient. Plan discussed with patient and agreeable to the plan.   FINAL CLINICAL IMPRESSION(S) / ED DIAGNOSES   Final diagnoses:  Leukocytosis, unspecified type  Acute kidney injury ([place])  Weakness  Acute midline low back pain with bilateral sciatica     Rx / DC Orders   ED Discharge Orders     None        Note:  This document was prepared using Dragon voice recognition software and may include unintentional dictation errors.   Victorino Dike, FNP 05/22/22 1749    Duffy Bruce, MD 05/25/22 2007

## 2022-05-22 NOTE — H&P (Signed)
History and Physical    [name] MBW:[id number] DOB: 05-08-1936 DOA: 05/22/2022 DOS: the patient was seen and examined on 05/22/2022 PCP: Ria Bush, MD  Patient coming from: Home  Chief Complaint:  Chief Complaint  Patient presents with   Back Pain   HPI: Anthony [name] is a 86 y.o. male with medical history significant of atrial flutter is status post ablation, chronic back pain status post stent spine stimulator which is malfunctioning, hypertension, hyperlipidemia, hypothyroidism, obesity, obstructive sleep apnea on CPAP comes to the emergency room with complaints of back pain lower radiating bilaterally to both legs going down along with increased urinary frequency.  Patient has history of pain stimulator in the lower back which has been malfunctioning. He was seen by neurosurgery in 2022 and then was lost to follow-up. He follows with pain clinic Dr. Maricela Curet and got a low wall lumbar spine steroid injection on 20 September. Patient said he started having worsening pain in the next 1 to 2 days and to the point he was unable to ambulate and today his legs gave out and he landed on his back. No family at bedside. Talked with son Dr. [name].  In the ER, patient is afebrile tachycardic with heart rate in the 120s, tachypnea, elevated white count of 20 1K, lactic acid went up to 2.0. Blood culture urine culture Ravindran. Received one dose of IV Rocephin given symptoms of incontinence/frequency. He received some IV pain meds and is being admitted for further evaluation management. Patient is being admitted with sepsis unknown etiology and back pain.  Was also found to have elevated creatinine baseline creatinine around 1.3. Came in with creatinine of 2.62. Patient received 1.5 L of IV fluid. He has been having poor PO intake for last few days.  Review of Systems: As mentioned in the history of present illness. All other systems reviewed and are negative. Past  Medical History:  Diagnosis Date   Anosmia 1980s   Atrial flutter ([place]) 2018   had an ablation with dr. Erick Alley   Basal cell carcinoma 01/11/2022   L cheek - excised 04/25/22   CAD (coronary artery disease)    Cancer (HCC)    skin cancer   Chronic constipation    Chronic insomnia    Chronic lower back pain    s/p spine stimulator placement   Complication of anesthesia    when under general, he woke up agitated and unable to be held down   Diastolic CHF ([place])    History of diverticulitis 2017   History of pneumonia 2013   Hyperlipidemia    Hypertension    Hypothyroidism    Obesity, Class II, BMI 35-39.9, with comorbidity    OSA on CPAP    PONV (postoperative nausea and vomiting)    Past Surgical History:  Procedure Laterality Date   A-FLUTTER ABLATION N/A 05/07/2017   Procedure: A-Flutter Ablation;  Surgeon: Deboraha Sprang, MD;  Location: [facility];  Service: Cardiovascular;  Laterality: N/A;   CARDIOVASCULAR STRESS TEST  11/2017   no ischemia, low risk study   COLONOSCOPY  2007   normal per prior PCP records, rpt 10 yrs (Dr Osie Cheeks)   [place] Right    ulnar nerve decompression.  PT DOES NOT RECALL THIS PROCEDURE   ENDOVASCULAR REPAIR/STENT GRAFT N/A 05/05/2020   Procedure: ENDOVASCULAR REPAIR/STENT GRAFT;  Surgeon: Algernon Huxley, MD;  Location: [facility];  Service: Cardiovascular;  Laterality: N/A;   LAMINECTOMY  THORACIC SPINE W/ PLACEMENT SPINAL CORD STIMULATOR  08/2018   and removal of prior spine stimulator (Dr Lacinda Axon)   NASAL SINUS SURGERY     nasal polyps. done a long time ago   [place] (PCI-S)  2013   EF55%, 70% mid LAD, 99% mid RCA, mild MR, elev LVEDP, DES to mid LAD. RCA is nondominant   RADIOFREQUENCY ABLATION  06/2017   lumbar region [facility])    REPLACEMENT TOTAL KNEE BILATERAL Bilateral 2000s   SPINAL CORD STIMULATOR IMPLANT  06/2016   SPINAL CORD STIMULATOR INSERTION N/A 09/09/2018   Procedure: LUMBAR  SPINAL CORD STIMULATOR LEAD AND BATTERY REMOVAL AND LAMINECTOMY FOR PLACEMENT OF PADDLE;  Surgeon: Deetta Perla, MD;  Location: [facility];  Service: Neurosurgery;  Laterality: N/A;   SPINAL CORD STIMULATOR INSERTION N/A 09/16/2018   Procedure: PLACEMENT OF SPINAL CORD STIMULATOR BATTERY;  Surgeon: Deetta Perla, MD;  Location: [facility];  Service: Neurosurgery;  Laterality: N/A;   Social History:  reports that he has never smoked. He has never been exposed to tobacco smoke. He has never used smokeless tobacco. He reports that he does not drink alcohol and does not use drugs.  Allergies  Allergen Reactions   Questran [Cholestyramine]     Patient not aware of an allergy to this medicine.   Atorvastatin     Muscle pain   Gemfibrozil Other (See Comments)    Muscle pain.   Metformin And Related     Dizziness   Rosuvastatin     Muscle pain    Family History  Problem Relation Age of Onset   Stroke Mother    Kidney disease Father    Leukemia Brother    Stroke Sister    Diabetes Neg Hx     Prior to Admission medications   Medication Sig Start Date End Date Taking? Authorizing Provider  acetaminophen (TYLENOL) 500 MG tablet Take 1 tablet (500 mg total) by mouth 2 (two) times daily as needed for moderate pain. 11/06/19   Ria Bush, MD  Alirocumab (PRALUENT) 75 MG/ML SOAJ Inject 1 pen. into the skin every 14 (fourteen) days. 01/10/22   Deboraha Sprang, MD  amLODipine (NORVASC) 5 MG tablet TAKE 1 TABLET(5 MG) BY MOUTH DAILY 04/11/22   Ria Bush, MD  aspirin EC 81 MG tablet Take 1 tablet (81 mg total) by mouth daily. 11/05/18   Ria Bush, MD  Cholecalciferol (VITAMIN D3) 1000 units CAPS Take 1 capsule (1,000 Units total) by mouth daily. 05/22/18   Ria Bush, MD  ciclopirox (PENLAC) 8 % solution APPLY TOPICALLY AT BEDTIME APPLY OVER NAIL AND SURROUNDING SKIN, APPLY DAILY OVER PREVIOUS COAT, AFTER 7 DAYS MAY REMOVE WITH ALCOHOL AND REPEAT 02/06/22   Ralene Bathe, MD   clopidogrel (PLAVIX) 75 MG tablet TAKE 1 TABLET(75 MG) BY MOUTH DAILY AT 6 AM 04/27/21   Kris Hartmann, NP  diphenhydramine-acetaminophen (TYLENOL PM) 25-500 MG TABS tablet Take 2 tablets by mouth at bedtime.    [provider]  docusate sodium (COLACE) 100 MG capsule Take 100 mg by mouth daily.    [provider]  Eszopiclone 3 MG TABS Take 1 tablet (3 mg total) by mouth at bedtime as needed. Use sparingly, take immediately before bedtime 09/12/21   Ria Bush, MD  ezetimibe (ZETIA) 10 MG tablet Take 1 tablet (10 mg total) by mouth daily. 12/28/21   Ria Bush, MD  finasteride (PROSCAR) 5 MG tablet TAKE 1 TABLET(5 MG) BY MOUTH DAILY 03/29/22  Ria Bush, MD  fluocinonide (LIDEX) 0.05 % external solution Apply 1 application topically 2 (two) times daily as needed. Patient taking differently: Apply 1 application  topically daily as needed (irritation). 03/11/20   Ralene Bathe, MD  furosemide (LASIX) 40 MG tablet TAKE 1 TABLET BY MOUTH EVERY DAY. TAKE AN EXTRA TABLET AS NEEDED FOR WEIGHT GAIN OF 3 LBS/DAY OR 5LBS/WEEK 10/19/21   Ria Bush, MD  gabapentin (NEURONTIN) 300 MG capsule TAKE 2 CAPSULE BY MOUTH AT BEDTIME WITH EXTRA CAPSULE DURING THE DAY AS NEEDED 04/18/22   Ria Bush, MD  icosapent Ethyl (VASCEPA) 1 g capsule Take 2 capsules (2 g total) by mouth 2 (two) times daily. 09/12/21   Deboraha Sprang, MD  ketoconazole (NIZORAL) 2 % cream APPLY TO FEET AND FACE AT BEDTIME AS DIRECTED 09/19/21   Ralene Bathe, MD  ketoconazole (NIZORAL) 2 % shampoo APPLY TOPICALLY 3 TIMES WEEKLY AS DIRECTED 02/06/22   Ralene Bathe, MD  levothyroxine (SYNTHROID) 175 MCG tablet Take 1 tablet (175 mcg total) by mouth daily before breakfast. 05/02/22   Ria Bush, MD  LINZESS 290 MCG CAPS capsule TAKE 1 CAPSULE(290 MCG) BY MOUTH DAILY BEFORE BREAKFAST 04/18/22   Ria Bush, MD  lisinopril (ZESTRIL) 10 MG tablet TAKE 1 TABLET(10 MG) BY MOUTH DAILY  12/28/21   Ria Bush, MD  Melatonin 10 MG CAPS Take 10 mg by mouth at bedtime.    [provider]  meloxicam (MOBIC) 7.5 MG tablet Take 1 tablet (7.5 mg total) by mouth daily. 05/02/22   Ria Bush, MD  mometasone (ELOCON) 0.1 % cream APPLY TOPICALLY TO THE AFFECTED AREA DAILY AS NEEDED FOR RASH 02/06/22   Ralene Bathe, MD  mometasone (ELOCON) 0.1 % lotion APPLY TO SCALP AT BEDTIME ON Laurine Blazer, AND FRIDAY 02/16/22   Ralene Bathe, MD  Multiple Vitamins-Minerals (CENTRUM SILVER PO) Take 1 tablet by mouth daily.    [provider]  Multiple Vitamins-Minerals (PRESERVISION AREDS 2 PO) Take 1 tablet by mouth daily.    [provider]  mupirocin ointment (BACTROBAN) 2 % Apply 1 Application topically daily. With dressing changes 04/25/22   Ralene Bathe, MD  naproxen sodium (ALEVE) 220 MG tablet Take 220 mg by mouth daily as needed.    [provider]  nitroGLYCERIN (NITROSTAT) 0.4 MG SL tablet Place 1 tablet (0.4 mg total) under the tongue every 5 (five) minutes as needed for chest pain. 12/01/17   Ria Bush, MD  ondansetron (ZOFRAN) 4 MG tablet Take 1 tablet (4 mg total) by mouth every 6 (six) hours as needed for nausea. 10/31/21   Lorella Nimrod, MD  oxyCODONE-acetaminophen (PERCOCET) 7.5-325 MG tablet Take 1 tablet by mouth 3 (three) times daily as needed for severe pain. 05/02/22   Ria Bush, MD  senna (SENOKOT) 8.6 MG tablet Take 1 tablet (8.6 mg total) by mouth daily. 04/06/21   Ria Bush, MD  tamsulosin (FLOMAX) 0.4 MG CAPS capsule Take 1 capsule (0.4 mg total) by mouth daily. 08/30/21   Ria Bush, MD  tiZANidine (ZANAFLEX) 2 MG tablet TAKE 1 TO 2 TABLETS(2 TO 4 MG) BY MOUTH TWICE DAILY AS NEEDED FOR MUSCLE SPASMS 02/08/22   Ria Bush, MD  traZODone (DESYREL) 50 MG tablet Take 1 tablet (50 mg total) by mouth at bedtime as needed for sleep. 05/18/22   Ria Bush, MD    Physical Exam: Vitals:    05/22/22 1049 05/22/22 1051 05/22/22 1442 05/22/22 1720  BP:  Marland Kitchen)  146/80 110/66 121/63  Pulse:  99 (!) 104 (!) 121  Resp:  _0 Temp: 98.2 F (36.8 C)   98.5 F (36.9 C)  TempSrc: Oral   Oral  SpO2:  92% 92% 95%   Physical Exam Constitutional:      General: He is in acute distress.     Appearance: Normal appearance. He is obese.  HENT:     Head: Normocephalic and atraumatic.  Eyes:     Pupils: Pupils are equal, round, and reactive to light.  Cardiovascular:     Rate and Rhythm: Tachycardia present.     Pulses: Normal pulses.  Pulmonary:     Effort: Pulmonary effort is normal.     Breath sounds: Normal breath sounds.  Abdominal:     Palpations: Abdomen is soft.  Skin:    General: Skin is warm.  Neurological:     General: No focal deficit present.     Mental Status: He is alert.  Psychiatric:        Mood and Affect: Mood normal.   Limited lower extremity exam secondary to back pain. Patient is able to raise his leg long to 35 straight leg. Continues to have pain while doing it  Assessment and Plan:  Anthony [name] is a 86 y.o. male with medical history significant of atrial flutter is status post ablation, chronic back pain status post stent spine stimulator which is malfunctioning, hypertension, hyperlipidemia, hypothyroidism, obesity, obstructive sleep apnea on CPAP comes to the emergency room with complaints of back pain lower radiating bilaterally to both legs going down along with increased urinary frequency.  Severe sepsis present on admission, unclear source -- could be UTI, back pain with possible discitis recent procedure -- patient was found to have tachycardia count, elevated white count of 20 1K, lactic acid of 2.0, increased creatinine to 2.6 to baseline 1.3 -- continue IV Rocephin -- follow-up blood culture urine culture -- continue IV fluids --  trend lactate level  Chronic back pain with severe degenerative arthritis chronic pain/history of spinal  cord pain stimulator-- malfunctioning --patient follows with pain clinic Dr. Zollie Scale as outpatient recently got lumbar spinal steroid injection on 20 September -- will check ESR, CRP -- consider neurosurgery consultation given increase in weakness more on the left and right. -- Unable to do MRI given stimulator -- PRN PO and IV pain meds -- will add muscle relaxants-- patient is on gabapentin  Acute on chronic kidney disease stage IIIB --baseline creatinine 1.3 -- came in with creatinine of 2.62 -- IV fluids, monitor input output, monitor creatinine with metabolic panel --avoid nephrotoxic agents -- consider nephrology consultation needed  Leukocytosis -- appears reactive due to steroids versus to infection -- await blood culture urine culture --continue IV Rocephin  Elevated troponin appears demand ischemia in the setting of sepsis -- patient denies any chest pain -he has-history of CAD with stent placed in LAD few years ago -- history of atrial flutter status post ambulation in the past. Patient follows with [facility] -- will continue aspirin and Plavix along with statins  Hypothyroidism -- continue Synthroid  Hyperlipidemia continue Zetia     Advance Care Planning:   Code Status: DNR patient agrees  Consults: neurosurgery  Family Communication: son Guido Comp  Severity of Illness: The appropriate patient status for this patient is INPATIENT. Inpatient status is judged to be reasonable and necessary in order to provide the required intensity of service to ensure the patient's safety. The  patient's presenting symptoms, physical exam findings, and initial radiographic and laboratory data in the context of their chronic comorbidities is felt to place them at high risk for further clinical deterioration. Furthermore, it is not anticipated that the patient will be medically stable for discharge from the hospital within 2 midnights of admission.   * I certify that at the  point of admission it is my clinical judgment that the patient will require inpatient hospital care spanning beyond 2 midnights from the point of admission due to high intensity of service, high risk for further deterioration and high frequency of surveillance required.*  Author: Fritzi Mandes, MD 05/22/2022 5:58 PM  For on call review [email or web address].

## 2022-05-22 NOTE — ED Notes (Addendum)
This writer rounded on pt and found pt laying on floor a 2205. Pt states he was trying to sit on the edge of bed and he slid and rolled on to the floor. Pt denies hitting his head. Pt c/o of back pain but states he has chronic back pain. Pt also states he feels no different before rolling onto the floor. Floor Coverage provider Neomia Glass, NP notified at 2233.   Addition to note:  Pt had on non-skid socks at the time of incident. Pt was placed back in bed at 2225. Esbeyde RN, Dawson Bills RN, Christina RN, and this writer all assisted pt back into bed. Bed alarm was set on as soon as pt was placed back into bed. Yellow fall bracelet placed on pt's wrist. Pt educated on using call light, pt verbalized understanding.

## 2022-05-22 NOTE — ED Triage Notes (Signed)
Pt BIB EMS due to having a fall today. Per pt, he had an injection in his back for his chronic back pain and since then he has had some weakness in his legs. Pt denies LOC or hitting his head.

## 2022-05-23 ENCOUNTER — Telehealth: Payer: Self-pay

## 2022-05-23 ENCOUNTER — Other Ambulatory Visit: Payer: Self-pay

## 2022-05-23 DIAGNOSIS — M5441 Lumbago with sciatica, right side: Secondary | ICD-10-CM

## 2022-05-23 DIAGNOSIS — M5442 Lumbago with sciatica, left side: Secondary | ICD-10-CM

## 2022-05-23 DIAGNOSIS — R7881 Bacteremia: Secondary | ICD-10-CM | POA: Diagnosis not present

## 2022-05-23 DIAGNOSIS — N179 Acute kidney failure, unspecified: Secondary | ICD-10-CM | POA: Diagnosis not present

## 2022-05-23 DIAGNOSIS — A419 Sepsis, unspecified organism: Secondary | ICD-10-CM | POA: Diagnosis not present

## 2022-05-23 DIAGNOSIS — B957 Other staphylococcus as the cause of diseases classified elsewhere: Secondary | ICD-10-CM

## 2022-05-23 DIAGNOSIS — R6520 Severe sepsis without septic shock: Secondary | ICD-10-CM | POA: Diagnosis not present

## 2022-05-23 DIAGNOSIS — D72829 Elevated white blood cell count, unspecified: Secondary | ICD-10-CM | POA: Diagnosis not present

## 2022-05-23 LAB — BLOOD CULTURE ID PANEL (REFLEXED) - BCID2

## 2022-05-23 LAB — BASIC METABOLIC PANEL
Anion gap: 7 (ref 5–15)
BUN: 56 mg/dL — ABNORMAL HIGH (ref 8–23)
CO2: 22 mmol/L (ref 22–32)
Calcium: 9.3 mg/dL (ref 8.9–10.3)
Chloride: 108 mmol/L (ref 98–111)
Creatinine, Ser: 2.14 mg/dL — ABNORMAL HIGH (ref 0.61–1.24)
GFR, Estimated: 30 mL/min — ABNORMAL LOW (ref >60)
Glucose, Bld: 152 mg/dL — ABNORMAL HIGH (ref 70–99)
Potassium: 3.4 mmol/L — ABNORMAL LOW (ref 3.5–5.1)
Sodium: 137 mmol/L (ref 135–145)

## 2022-05-23 LAB — CBC
HCT: 32.2 % — ABNORMAL LOW (ref 39.0–52.0)
Hemoglobin: 10.5 g/dL — ABNORMAL LOW (ref 13.0–17.0)
MCH: 31.3 pg (ref 26.0–34.0)
MCHC: 32.6 g/dL (ref 30.0–36.0)
MCV: 95.8 fL (ref 80.0–100.0)
Platelets: 221 10*3/uL (ref 150–400)
RBC: 3.36 MIL/uL — ABNORMAL LOW (ref 4.22–5.81)
RDW: 14.6 % (ref 11.5–15.5)
WBC: 18.9 10*3/uL — ABNORMAL HIGH (ref 4.0–10.5)
nRBC: 0 % (ref 0.0–0.2)

## 2022-05-23 MED ORDER — LEVOTHYROXINE SODIUM 50 MCG PO TABS
175.0000 ug | ORAL_TABLET | Freq: Every day | ORAL | Status: DC
  Administered 2022-05-24 – 2022-06-08 (×14): 175 ug via ORAL
  Filled 2022-05-23 (×14): qty 1

## 2022-05-23 MED ORDER — POTASSIUM CHLORIDE CRYS ER 20 MEQ PO TBCR
40.0000 meq | EXTENDED_RELEASE_TABLET | Freq: Once | ORAL | Status: AC
  Administered 2022-05-23: 40 meq via ORAL
  Filled 2022-05-23: qty 2

## 2022-05-23 MED ORDER — VANCOMYCIN HCL 2000 MG/400ML IV SOLN
2000.0000 mg | Freq: Once | INTRAVENOUS | Status: AC
  Administered 2022-05-23: 2000 mg via INTRAVENOUS
  Filled 2022-05-23: qty 400

## 2022-05-23 MED ORDER — ENOXAPARIN SODIUM 60 MG/0.6ML IJ SOSY
0.5000 mg/kg | PREFILLED_SYRINGE | Freq: Every day | INTRAMUSCULAR | Status: DC
  Administered 2022-05-23 – 2022-05-28 (×6): 62.5 mg via SUBCUTANEOUS
  Filled 2022-05-23 (×6): qty 1.2

## 2022-05-23 MED ORDER — LINACLOTIDE 145 MCG PO CAPS
290.0000 ug | ORAL_CAPSULE | Freq: Every day | ORAL | Status: DC
  Administered 2022-05-24 – 2022-06-08 (×11): 290 ug via ORAL
  Filled 2022-05-23 (×11): qty 2
  Filled 2022-05-23: qty 1
  Filled 2022-05-23 (×3): qty 2
  Filled 2022-05-23: qty 1
  Filled 2022-05-23 (×2): qty 2

## 2022-05-23 MED ORDER — VANCOMYCIN VARIABLE DOSE PER UNSTABLE RENAL FUNCTION (PHARMACIST DOSING): Status: DC

## 2022-05-23 MED ORDER — VANCOMYCIN HCL 10 G IV SOLR: 2500.0000 mg | Freq: Once | INTRAVENOUS | Status: DC

## 2022-05-23 NOTE — ED Notes (Signed)
Hospital bed alarming. Pt was found at the edge of the bed. Pt has removed VS monitoring and pulled out IV. Pt was asked to reposition himself in bed. Clean gown and bed sheets were provided. New IV was obtained. IVF restarted. VS monitoring reconnected. Pt has call be within reach. Discussed seriousness of fall that could lead to him injuring himself. Pt states" I'm 86 years old. Im ready to die." Pt  door left open for more visibility. Bed alarm remains on. Pt refused non-skid socks.

## 2022-05-23 NOTE — Consult Note (Signed)
Referring Physician:  No referring provider defined for this encounter.  Primary Physician:  Anthony Bush, MD  History of Present Illness: 05/23/2022 [name] is here today with a chief complaint of back pain.  He underwent an epidural steroid injection last week on September 20.  He did very well for 1 day, but then had onset of worsening pain down both extremities with difficulty walking due to severe pain.  He feels like his legs are going to give out.  He does not have any new sensory changes.  He denies bowel or bladder loss of control.  He does have a long history of back pain.  He was supposed to get his spinal cord stimulator battery changed out last summer, but did not get around to it.  Review of Systems:  A 10 point review of systems is negative, except for the pertinent positives and negatives detailed in the HPI.  Past Medical History: Past Medical History:  Diagnosis Date   Anosmia 1980s   Atrial flutter ([place]) 2018   had an ablation with dr. Erick Alley   Basal cell carcinoma 01/11/2022   L cheek - excised 04/25/22   CAD (coronary artery disease)    Cancer (HCC)    skin cancer   Chronic constipation    Chronic insomnia    Chronic lower back pain    s/p spine stimulator placement   Complication of anesthesia    when under general, he woke up agitated and unable to be held down   Diastolic CHF ([place])    History of diverticulitis 2017   History of pneumonia 2013   Hyperlipidemia    Hypertension    Hypothyroidism    Obesity, Class II, BMI 35-39.9, with comorbidity    OSA on CPAP    PONV (postoperative nausea and vomiting)     Past Surgical History: Past Surgical History:  Procedure Laterality Date   A-FLUTTER ABLATION N/A 05/07/2017   Procedure: A-Flutter Ablation;  Surgeon: Deboraha Sprang, MD;  Location: [place] [facility];  Service: Cardiovascular;  Laterality: N/A;   CARDIOVASCULAR STRESS TEST  11/2017   no ischemia, low risk study    COLONOSCOPY  2007   normal per prior PCP records, rpt 10 yrs (Dr Osie Cheeks)   [place] Right    ulnar nerve decompression.  PT DOES NOT RECALL THIS PROCEDURE   ENDOVASCULAR REPAIR/STENT GRAFT N/A 05/05/2020   Procedure: ENDOVASCULAR REPAIR/STENT GRAFT;  Surgeon: Algernon Huxley, MD;  Location: [place] [facility];  Service: Cardiovascular;  Laterality: N/A;   LAMINECTOMY THORACIC SPINE W/ PLACEMENT SPINAL CORD STIMULATOR  08/2018   and removal of prior spine stimulator (Dr Lacinda Axon)   NASAL SINUS SURGERY     nasal polyps. done a long time ago   [place] (PCI-S)  2013   EF55%, 70% mid LAD, 99% mid RCA, mild MR, elev LVEDP, DES to mid LAD. RCA is nondominant   RADIOFREQUENCY ABLATION  06/2017   lumbar region [facility])    REPLACEMENT TOTAL KNEE BILATERAL Bilateral 2000s   SPINAL CORD STIMULATOR IMPLANT  06/2016   SPINAL CORD STIMULATOR INSERTION N/A 09/09/2018   Procedure: LUMBAR SPINAL CORD STIMULATOR LEAD AND BATTERY REMOVAL AND LAMINECTOMY FOR PLACEMENT OF PADDLE;  Surgeon: Deetta Perla, MD;  Location: [facility];  Service: Neurosurgery;  Laterality: N/A;   SPINAL CORD STIMULATOR INSERTION N/A 09/16/2018   Procedure: PLACEMENT OF SPINAL CORD STIMULATOR BATTERY;  Surgeon: Deetta Perla, MD;  Location: [facility];  Service: Neurosurgery;  Laterality: N/A;    Allergies: Allergies as of 05/22/2022 - Review Complete 05/22/2022  Allergen Reaction Noted   Questran [cholestyramine]  04/09/2017   Atorvastatin  04/09/2017   Gemfibrozil Other (See Comments) 04/09/2017   Metformin and related  04/09/2017   Rosuvastatin  04/09/2017    Medications: Current Meds  Medication Sig   acetaminophen (TYLENOL) 500 MG tablet Take 1 tablet (500 mg total) by mouth 2 (two) times daily as needed for moderate pain.   amLODipine (NORVASC) 5 MG tablet TAKE 1 TABLET(5 MG) BY MOUTH DAILY (Patient taking differently: Take 5 mg by mouth at bedtime. TAKE 1 TABLET(5 MG) BY MOUTH DAILY)   aspirin  EC 81 MG tablet Take 1 tablet (81 mg total) by mouth daily.   clopidogrel (PLAVIX) 75 MG tablet TAKE 1 TABLET(75 MG) BY MOUTH DAILY AT 6 AM   diphenhydramine-acetaminophen (TYLENOL PM) 25-500 MG TABS tablet Take 2 tablets by mouth at bedtime.   docusate sodium (COLACE) 100 MG capsule Take 100 mg by mouth daily.   Eszopiclone 3 MG TABS Take 1 tablet (3 mg total) by mouth at bedtime as needed. Use sparingly, take immediately before bedtime   ezetimibe (ZETIA) 10 MG tablet Take 1 tablet (10 mg total) by mouth daily.   finasteride (PROSCAR) 5 MG tablet TAKE 1 TABLET(5 MG) BY MOUTH DAILY   furosemide (LASIX) 40 MG tablet TAKE 1 TABLET BY MOUTH EVERY DAY. TAKE AN EXTRA TABLET AS NEEDED FOR WEIGHT GAIN OF 3 LBS/DAY OR 5LBS/WEEK   gabapentin (NEURONTIN) 300 MG capsule TAKE 2 CAPSULE BY MOUTH AT BEDTIME WITH EXTRA CAPSULE DURING THE DAY AS NEEDED   icosapent Ethyl (VASCEPA) 1 g capsule Take 2 capsules (2 g total) by mouth 2 (two) times daily.   levothyroxine (SYNTHROID) 175 MCG tablet Take 1 tablet (175 mcg total) by mouth daily before breakfast.   LINZESS 290 MCG CAPS capsule TAKE 1 CAPSULE(290 MCG) BY MOUTH DAILY BEFORE BREAKFAST   lisinopril (ZESTRIL) 10 MG tablet TAKE 1 TABLET(10 MG) BY MOUTH DAILY   Melatonin 10 MG CAPS Take 10 mg by mouth at bedtime.   meloxicam (MOBIC) 7.5 MG tablet Take 1 tablet (7.5 mg total) by mouth daily.   Multiple Vitamins-Minerals (CENTRUM SILVER PO) Take 1 tablet by mouth daily.   Multiple Vitamins-Minerals (PRESERVISION AREDS 2 PO) Take 1 tablet by mouth daily.   naproxen sodium (ALEVE) 220 MG tablet Take 220 mg by mouth daily as needed.   oxyCODONE-acetaminophen (PERCOCET) 7.5-325 MG tablet Take 1 tablet by mouth 3 (three) times daily as needed for severe pain.   tamsulosin (FLOMAX) 0.4 MG CAPS capsule Take 1 capsule (0.4 mg total) by mouth daily.    Social History: Social History   Tobacco Use   Smoking status: Never    Passive exposure: Never   Smokeless  tobacco: Never  Vaping Use   Vaping Use: Never used  Substance Use Topics   Alcohol use: No    Comment: QUIT DRINKING   Drug use: No    Family Medical History: Family History  Problem Relation Age of Onset   Stroke Mother    Kidney disease Father    Leukemia Brother    Stroke Sister    Diabetes Neg Hx     Physical Examination: Vitals:   05/23/22 0700 05/23/22 0702  BP: 115/60 112/65  Pulse: (!) 103 99  Resp: 17 19  Temp:  99.4 F (37.4 C)  SpO2: 93% 93%    General: Patient  is well developed, well nourished, calm, collected, and in no apparent distress. Attention to examination is appropriate.  Neck:   Supple.  Respiratory: Patient is breathing without any difficulty.   NEUROLOGICAL:     Awake, alert, oriented to person, place, and time.  Speech is clear and fluent. Fund of knowledge is appropriate.   Cranial Nerves: Pupils equal round and reactive to light.  Facial tone is symmetric.  Facial sensation is symmetric. Shoulder shrug is symmetric. Tongue protrusion is midline.   Strength: Side Biceps Triceps Deltoid Interossei Grip Wrist Ext. Wrist Flex.  R '5 5 5 5 5 5 5  '$ L '5 5 5 5 5 5 5   '$ Side Iliopsoas Quads Hamstring PF DF EHL  R '5 5 5 5 5 5  '$ L '5 5 5 5 5 5   '$ Reflexes are 1+ and symmetric at the biceps, triceps, brachioradialis, patella and achilles.   Hoffman's is absent.   Bilateral upper and lower extremity sensation is intact to light touch.    No evidence of dysmetria noted.  Gait is untested   Medical Decision Making  Imaging: CT L spine 05/22/22 IMPRESSION: 1.  No evidence of acute osseous trauma in the lumbar spine.   2. Multilevel severe degenerative changes throughout the lumbar spine with severe spinal canal stenosis from the L1-L5 level and severe bilateral neuroforaminal stenosis from the L3-S1 level. Consider further evaluation with a dedicated lumbar spine MRI for bettera characterization of the degree of spinal canal stenosis,  if clinically warranted.   3. Chronic bilateral pars defects at L5, as well as a chronic fracture through the left pedicle.     Electronically Signed   By: Marin Roberts M.D.   On: 05/22/2022 16:57  I have personally reviewed the images and agree with the above interpretation.  Assessment and Plan: Mr. Stearns is a pleasant 86 y.o. male with symptoms consistent with post injection pain flare.  This happens a small percentage of the time, and can be exquisitely painful.  He has reassuring neurologic examination findings and has control of his bowel bladder, which lessens the likelihood of an epidural hematoma or some other mass lesion.  I would consider treatment with steroids and any additional work-up necessary per Dr. Posey Pronto.  If he ultimately needs imaging, 1 could consider MRI scan versus CT myelogram.  I am happy to see him on an outpatient basis to discuss replacement of his stimulator pulse generator.  Thank you for involving me in the care of this patient.      Blimi Godby K. Izora Ribas MD, [facility] Neurosurgery

## 2022-05-23 NOTE — H&P (View-Only) (Signed)
Referring Physician:  No referring provider defined for this encounter.  Primary Physician:  Ria Bush, MD  History of Present Illness: 05/23/2022 [name] is here today with a chief complaint of back pain.  Anthony [name] underwent an epidural steroid injection last week on September 20.  Anthony [name] did very well for 1 day, but then had onset of worsening pain down both extremities with difficulty walking due to severe pain.  Anthony [name] feels like his legs are going to give out.  Anthony [name] does not have any new sensory changes.  Anthony [name] denies bowel or bladder loss of control.  Anthony [name] does have a long history of back pain.  Anthony [name] was supposed to get his spinal cord stimulator battery changed out last summer, but did not get around to it.  Review of Systems:  A 10 point review of systems is negative, except for the pertinent positives and negatives detailed in the HPI.  Past Medical History: Past Medical History:  Diagnosis Date   Anosmia 1980s   Atrial flutter ([place]) 2018   had an ablation with dr. Erick Alley   Basal cell carcinoma 01/11/2022   L cheek - excised 04/25/22   CAD (coronary artery disease)    Cancer (HCC)    skin cancer   Chronic constipation    Chronic insomnia    Chronic lower back pain    s/p spine stimulator placement   Complication of anesthesia    when under general, Anthony [name] woke up agitated and unable to be held down   Diastolic CHF ([place])    History of diverticulitis 2017   History of pneumonia 2013   Hyperlipidemia    Hypertension    Hypothyroidism    Obesity, Class II, BMI 35-39.9, with comorbidity    OSA on CPAP    PONV (postoperative nausea and vomiting)     Past Surgical History: Past Surgical History:  Procedure Laterality Date   A-FLUTTER ABLATION N/A 05/07/2017   Procedure: A-Flutter Ablation;  Surgeon: Deboraha Sprang, MD;  Location: [facility];  Service: Cardiovascular;  Laterality: N/A;   CARDIOVASCULAR STRESS TEST  11/2017   no ischemia, low risk study    COLONOSCOPY  2007   normal per prior PCP records, rpt 10 yrs (Dr Osie Cheeks)   [place] Right    ulnar nerve decompression.  PT DOES NOT RECALL THIS PROCEDURE   ENDOVASCULAR REPAIR/STENT GRAFT N/A 05/05/2020   Procedure: ENDOVASCULAR REPAIR/STENT GRAFT;  Surgeon: Algernon Huxley, MD;  Location: [facility];  Service: Cardiovascular;  Laterality: N/A;   LAMINECTOMY THORACIC SPINE W/ PLACEMENT SPINAL CORD STIMULATOR  08/2018   and removal of prior spine stimulator (Dr Lacinda Axon)   NASAL SINUS SURGERY     nasal polyps. done a long time ago   [place] (PCI-S)  2013   EF55%, 70% mid LAD, 99% mid RCA, mild MR, elev LVEDP, DES to mid LAD. RCA is nondominant   RADIOFREQUENCY ABLATION  06/2017   lumbar region [facility])    REPLACEMENT TOTAL KNEE BILATERAL Bilateral 2000s   SPINAL CORD STIMULATOR IMPLANT  06/2016   SPINAL CORD STIMULATOR INSERTION N/A 09/09/2018   Procedure: LUMBAR SPINAL CORD STIMULATOR LEAD AND BATTERY REMOVAL AND LAMINECTOMY FOR PLACEMENT OF PADDLE;  Surgeon: Deetta Perla, MD;  Location: [facility];  Service: Neurosurgery;  Laterality: N/A;   SPINAL CORD STIMULATOR INSERTION N/A 09/16/2018   Procedure: PLACEMENT OF SPINAL CORD STIMULATOR BATTERY;  Surgeon: Deetta Perla, MD;  Location: [facility];  Service: Neurosurgery;  Laterality: N/A;    Allergies: Allergies as of 05/22/2022 - Review Complete 05/22/2022  Allergen Reaction Noted   Questran [cholestyramine]  04/09/2017   Atorvastatin  04/09/2017   Gemfibrozil Other (See Comments) 04/09/2017   Metformin and related  04/09/2017   Rosuvastatin  04/09/2017    Medications: Current Meds  Medication Sig   acetaminophen (TYLENOL) 500 MG tablet Take 1 tablet (500 mg total) by mouth 2 (two) times daily as needed for moderate pain.   amLODipine (NORVASC) 5 MG tablet TAKE 1 TABLET(5 MG) BY MOUTH DAILY (Patient taking differently: Take 5 mg by mouth at bedtime. TAKE 1 TABLET(5 MG) BY MOUTH DAILY)   aspirin  EC 81 MG tablet Take 1 tablet (81 mg total) by mouth daily.   clopidogrel (PLAVIX) 75 MG tablet TAKE 1 TABLET(75 MG) BY MOUTH DAILY AT 6 AM   diphenhydramine-acetaminophen (TYLENOL PM) 25-500 MG TABS tablet Take 2 tablets by mouth at bedtime.   docusate sodium (COLACE) 100 MG capsule Take 100 mg by mouth daily.   Eszopiclone 3 MG TABS Take 1 tablet (3 mg total) by mouth at bedtime as needed. Use sparingly, take immediately before bedtime   ezetimibe (ZETIA) 10 MG tablet Take 1 tablet (10 mg total) by mouth daily.   finasteride (PROSCAR) 5 MG tablet TAKE 1 TABLET(5 MG) BY MOUTH DAILY   furosemide (LASIX) 40 MG tablet TAKE 1 TABLET BY MOUTH EVERY DAY. TAKE AN EXTRA TABLET AS NEEDED FOR WEIGHT GAIN OF 3 LBS/DAY OR 5LBS/WEEK   gabapentin (NEURONTIN) 300 MG capsule TAKE 2 CAPSULE BY MOUTH AT BEDTIME WITH EXTRA CAPSULE DURING THE DAY AS NEEDED   icosapent Ethyl (VASCEPA) 1 g capsule Take 2 capsules (2 g total) by mouth 2 (two) times daily.   levothyroxine (SYNTHROID) 175 MCG tablet Take 1 tablet (175 mcg total) by mouth daily before breakfast.   LINZESS 290 MCG CAPS capsule TAKE 1 CAPSULE(290 MCG) BY MOUTH DAILY BEFORE BREAKFAST   lisinopril (ZESTRIL) 10 MG tablet TAKE 1 TABLET(10 MG) BY MOUTH DAILY   Melatonin 10 MG CAPS Take 10 mg by mouth at bedtime.   meloxicam (MOBIC) 7.5 MG tablet Take 1 tablet (7.5 mg total) by mouth daily.   Multiple Vitamins-Minerals (CENTRUM SILVER PO) Take 1 tablet by mouth daily.   Multiple Vitamins-Minerals (PRESERVISION AREDS 2 PO) Take 1 tablet by mouth daily.   naproxen sodium (ALEVE) 220 MG tablet Take 220 mg by mouth daily as needed.   oxyCODONE-acetaminophen (PERCOCET) 7.5-325 MG tablet Take 1 tablet by mouth 3 (three) times daily as needed for severe pain.   tamsulosin (FLOMAX) 0.4 MG CAPS capsule Take 1 capsule (0.4 mg total) by mouth daily.    Social History: Social History   Tobacco Use   Smoking status: Never    Passive exposure: Never   Smokeless  tobacco: Never  Vaping Use   Vaping Use: Never used  Substance Use Topics   Alcohol use: No    Comment: QUIT DRINKING   Drug use: No    Family Medical History: Family History  Problem Relation Age of Onset   Stroke Mother    Kidney disease Father    Leukemia Brother    Stroke Sister    Diabetes Neg Hx     Physical Examination: Vitals:   05/23/22 0700 05/23/22 0702  BP: 115/60 112/65  Pulse: (!) 103 99  Resp: 17 19  Temp:  99.4 F (37.4 C)  SpO2: 93% 93%    General: Patient  is well developed, well nourished, calm, collected, and in no apparent distress. Attention to examination is appropriate.  Neck:   Supple.  Respiratory: Patient is breathing without any difficulty.   NEUROLOGICAL:     Awake, alert, oriented to person, place, and time.  Speech is clear and fluent. Fund of knowledge is appropriate.   Cranial Nerves: Pupils equal round and reactive to light.  Facial tone is symmetric.  Facial sensation is symmetric. Shoulder shrug is symmetric. Tongue protrusion is midline.   Strength: Side Biceps Triceps Deltoid Interossei Grip Wrist Ext. Wrist Flex.  R '5 5 5 5 5 5 5  '$ L '5 5 5 5 5 5 5   '$ Side Iliopsoas Quads Hamstring PF DF EHL  R '5 5 5 5 5 5  '$ L '5 5 5 5 5 5   '$ Reflexes are 1+ and symmetric at the biceps, triceps, brachioradialis, patella and achilles.   Hoffman's is absent.   Bilateral upper and lower extremity sensation is intact to light touch.    No evidence of dysmetria noted.  Gait is untested   Medical Decision Making  Imaging: CT L spine 05/22/22 IMPRESSION: 1.  No evidence of acute osseous trauma in the lumbar spine.   2. Multilevel severe degenerative changes throughout the lumbar spine with severe spinal canal stenosis from the L1-L5 level and severe bilateral neuroforaminal stenosis from the L3-S1 level. Consider further evaluation with a dedicated lumbar spine MRI for bettera characterization of the degree of spinal canal stenosis,  if clinically warranted.   3. Chronic bilateral pars defects at L5, as well as a chronic fracture through the left pedicle.     Electronically Signed   By: Marin Roberts M.D.   On: 05/22/2022 16:57  I have personally reviewed the images and agree with the above interpretation.  Assessment and Plan: [name] is a pleasant 86 y.o. male with symptoms consistent with post injection pain flare.  This happens a small percentage of the time, and can be exquisitely painful.  Anthony [name] has reassuring neurologic examination findings and has control of his bowel bladder, which lessens the likelihood of an epidural hematoma or some other mass lesion.  I would consider treatment with steroids and any additional work-up necessary per Dr. Posey Pronto.  If Anthony [name] ultimately needs imaging, 1 could consider MRI scan versus CT myelogram.  I am happy to see Anthony [name] on an outpatient basis to discuss replacement of his stimulator pulse generator.  Thank you for involving me in the care of this patient.      Darwyn Ponzo K. Izora Ribas MD, [facility] Neurosurgery

## 2022-05-23 NOTE — Progress Notes (Signed)
       CROSS COVER NOTE  NAME: Anthony [name] MRN: [id number] DOB : May 03, 1936    Date of Service   05/23/2022   HPI/Events of Note   Mr Hightower voided ~150 mL this evening with a PVR >929m  Interventions   Assessment/Plan:  In and Out Cath x1     This document was prepared using Dragon voice recognition software and may include unintentional dictation errors.  KNeomia GlassDNP, MHA, FNP-BC Nurse Practitioner [facility] CSpencer Municipal HospitalPager ([phone]

## 2022-05-23 NOTE — Consult Note (Signed)
NAME: Anthony [name]  DOB: [birth year]  MRN: [id number]  Date/Time: 05/23/2022 2:39 PM  REQUESTING PROVIDER: Dr.patel Subjective:  REASON FOR CONSULT: bacteremia ?His son who is an urologist is at bed side Anthony [name] is a 86 y.o.male  with a history of Afib, CAD s/p stent, DDD, HTN presented to the ED on 05/22/22 after a fall and weakness legs and back pain Pt has chronic low back pain with radiation to the left leg HE received lumbar epidural steroid injection  at L5-S1 on 05/17/22. The pain improved a lot the next day only to get worse, a few days later with pain radiating down both legs and also had some weakness legs . HE had a fall yesterday and was brought to the ED No fever or chills, No incontinence but occasionally has some LUTS like symptoms before this In the ED vitals were   05/22/22  BP 128/87  Temp 100.2 F (37.9 C)  Pulse Rate 117 !  Resp 14  SpO2 95 %  O2 Flow Rate (L/min) 2 L/min    Labs showed  Latest Reference Range & Units 05/22/22  WBC 4.0 - 10.5 K/uL 21.6 (H)  Hemoglobin 13.0 - 17.0 g/dL 11.8 (L)  HCT 39.0 - 52.0 % 36.2 (L)  Platelets 150 - 400 K/uL 263  Creatinine 0.61 - 1.24 mg/dL 2.62 (H)   CT scan of lumbar spine without contrast shows  Multilevel severe degenerative changes throughout the lumbar spine with severe spinal canal stenosis from the L1-L5 level and severe bilateral neuroforaminal stenosis from the L3-S1 level. I am asked to see patient as his blood culture came positive for staph   Past Medical History:  Diagnosis Date   Anosmia 1980s   Atrial flutter ([place]) 2018   had an ablation with dr. Erick Alley   Basal cell carcinoma 01/11/2022   L cheek - excised 04/25/22   CAD (coronary artery disease)    Cancer (HCC)    skin cancer   Chronic constipation    Chronic insomnia    Chronic lower back pain    s/p spine stimulator placement   Complication of anesthesia    when under general, he woke up agitated and unable to be held down    Diastolic CHF ([place])    History of diverticulitis 2017   History of pneumonia 2013   Hyperlipidemia    Hypertension    Hypothyroidism    Obesity, Class II, BMI 35-39.9, with comorbidity    OSA on CPAP    PONV (postoperative nausea and vomiting)     Past Surgical History:  Procedure Laterality Date   A-FLUTTER ABLATION N/A 05/07/2017   Procedure: A-Flutter Ablation;  Surgeon: Deboraha Sprang, MD;  Location: [facility];  Service: Cardiovascular;  Laterality: N/A;   CARDIOVASCULAR STRESS TEST  11/2017   no ischemia, low risk study   COLONOSCOPY  2007   normal per prior PCP records, rpt 10 yrs (Dr Osie Cheeks)   [place] Right    ulnar nerve decompression.  PT DOES NOT RECALL THIS PROCEDURE   ENDOVASCULAR REPAIR/STENT GRAFT N/A 05/05/2020   Procedure: ENDOVASCULAR REPAIR/STENT GRAFT;  Surgeon: Algernon Huxley, MD;  Location: [facility];  Service: Cardiovascular;  Laterality: N/A;   LAMINECTOMY THORACIC SPINE W/ PLACEMENT SPINAL CORD STIMULATOR  08/2018   and removal of prior spine stimulator (Dr Lacinda Axon)   NASAL SINUS SURGERY     nasal polyps. done a long time ago   [place]  INTERVENTION (PCI-S)  2013   EF55%, 70% mid LAD, 99% mid RCA, mild MR, elev LVEDP, DES to mid LAD. RCA is nondominant   RADIOFREQUENCY ABLATION  06/2017   lumbar region [facility])    REPLACEMENT TOTAL KNEE BILATERAL Bilateral 2000s   SPINAL CORD STIMULATOR IMPLANT  06/2016   SPINAL CORD STIMULATOR INSERTION N/A 09/09/2018   Procedure: LUMBAR SPINAL CORD STIMULATOR LEAD AND BATTERY REMOVAL AND LAMINECTOMY FOR PLACEMENT OF PADDLE;  Surgeon: Deetta Perla, MD;  Location: [facility];  Service: Neurosurgery;  Laterality: N/A;   SPINAL CORD STIMULATOR INSERTION N/A 09/16/2018   Procedure: PLACEMENT OF SPINAL CORD STIMULATOR BATTERY;  Surgeon: Deetta Perla, MD;  Location: [facility];  Service: Neurosurgery;  Laterality: N/A;    Social History   Socioeconomic History   Marital status: Widowed     Spouse name: Not on file   Number of children: Not on file   Years of education: Not on file   Highest education level: Not on file  Occupational History   Not on file  Tobacco Use   Smoking status: Never    Passive exposure: Never   Smokeless tobacco: Never  Vaping Use   Vaping Use: Never used  Substance and Sexual Activity   Alcohol use: No    Comment: QUIT DRINKING   Drug use: No   Sexual activity: Not Currently  Other Topics Concern   Not on file  Social History Narrative   Widower (wife passed 08/2014).    Lives alone, GF Miss [name] sometimes (she was nurse)   Son is [name] (FP at [employer]). Other son is a Psychologist, sport and exercise in [place]: retired, used to [employer] store   Edu: HS      Non-smoking; no alcohol. Lives in [place]; self.       Social Determinants of Health   Financial Resource Strain: Medium Risk (02/15/2021)   Overall Financial Resource Strain (CARDIA)    Difficulty of Paying Living Expenses: Somewhat hard  Food Insecurity: No Food Insecurity (05/23/2022)   Hunger Vital Sign    Worried About Running Out of Food in the Last Year: Never true    Ran Out of Food in the Last Year: Never true  Transportation Needs: No Transportation Needs (05/23/2022)   PRAPARE - Hydrologist (Medical): No    Lack of Transportation (Non-Medical): No  Physical Activity: Sufficiently Active (12/15/2020)   Exercise Vital Sign    Days of Exercise per Week: 7 days    Minutes of Exercise per Session: 60 min  Stress: No Stress Concern Present (12/15/2020)   [place]    Feeling of Stress : Not at all  Social Connections: Not on file  Intimate Partner Violence: Not At Risk (05/23/2022)   Humiliation, Afraid, Rape, and Kick questionnaire    Fear of Current or Ex-Partner: No    Emotionally Abused: No    Physically Abused: No    Sexually Abused: No    Family History   Problem Relation Age of Onset   Stroke Mother    Kidney disease Father    Leukemia Brother    Stroke Sister    Diabetes Neg Hx    Allergies  Allergen Reactions   Questran [Cholestyramine]     Patient not aware of an allergy to this medicine.   Atorvastatin     Muscle pain   Gemfibrozil Other (See Comments)  Muscle pain.   Metformin And Related     Dizziness   Rosuvastatin     Muscle pain   I? Current Facility-Administered Medications  Medication Dose Route Frequency Provider Last Rate Last Admin   0.9 %  sodium chloride infusion   Intravenous Continuous Fritzi Mandes, MD 100 mL/hr at 05/23/22 1327 1,000 mL at 05/23/22 1327   acetaminophen (TYLENOL) tablet 650 mg  650 mg Oral Q6H PRN Fritzi Mandes, MD   650 mg at 05/23/22 0327   Or   acetaminophen (TYLENOL) suppository 650 mg  650 mg Rectal Q6H PRN Fritzi Mandes, MD       enoxaparin (LOVENOX) injection 62.5 mg  0.5 mg/kg Subcutaneous QHS Fritzi Mandes, MD       [START ON 05/24/2022] levothyroxine (SYNTHROID) tablet 175 mcg  175 mcg Oral Q0600 Fritzi Mandes, MD       [START ON 05/24/2022] linaclotide (LINZESS) capsule 290 mcg  290 mcg Oral QAC breakfast Fritzi Mandes, MD       morphine (PF) 2 MG/ML injection 2 mg  2 mg Intravenous Q4H PRN Fritzi Mandes, MD   2 mg at 05/23/22 0138   ondansetron (ZOFRAN) tablet 4 mg  4 mg Oral Q6H PRN Fritzi Mandes, MD       Or   ondansetron [facility]) injection 4 mg  4 mg Intravenous Q6H PRN Fritzi Mandes, MD       oxyCODONE (Oxy IR/ROXICODONE) immediate release tablet 5 mg  5 mg Oral Q4H PRN Fritzi Mandes, MD   5 mg at 05/23/22 1420   traZODone (DESYREL) tablet 25 mg  25 mg Oral QHS PRN Fritzi Mandes, MD       vancomycin Alcus Dad) IVPB 2000 mg/400 mL  2,000 mg Intravenous Once Gretel Acre, RPH 200 mL/hr at 05/23/22 1334 2,000 mg at 05/23/22 1334   vancomycin variable dose per unstable renal function (pharmacist dosing)   Does not apply See admin instructions Oswald Hillock, RPH         Abtx:   Anti-infectives (From admission, onward)    Start     Dose/Rate Route Frequency Ordered Stop   05/23/22 1035  vancomycin variable dose per unstable renal function (pharmacist dosing)         Does not apply See admin instructions 05/23/22 1035     05/23/22 0945  vancomycin (VANCOREADY) IVPB 2000 mg/400 mL        2,000 mg 200 mL/hr over 120 Minutes Intravenous  Once 05/23/22 0939     05/23/22 0815  vancomycin (VANCOCIN) 2,500 mg in sodium chloride 0.9 % 500 mL IVPB  Status:  Discontinued        2,500 mg 262.5 mL/hr over 120 Minutes Intravenous  Once 05/23/22 0814 05/23/22 0814   05/22/22 1700  cefTRIAXone (ROCEPHIN) 1 g in sodium chloride 0.9 % 100 mL IVPB        1 g 200 mL/hr over 30 Minutes Intravenous STAT 05/22/22 1651 05/22/22 1802       REVIEW OF SYSTEMS:  Const: negative fever, negative chills, negative weight loss Eyes: negative diplopia or visual changes, negative eye pain ENT: negative coryza, negative sore throat Resp: negative cough, hemoptysis, dyspnea Cards: negative for chest pain, palpitations, lower extremity edema GU: has BPH GI: Negative for abdominal pain, diarrhea, bleeding, constipation Skin: negative for rash and pruritus Heme: negative for easy bruising and gum/nose bleeding MS: as above Neurolo:left leg weakness  Psych: negative for feelings of anxiety, depression  Endocrine:  thyroid,  Allergy/Immunology- as above  Objective:  VITALS:  BP (!) 175/95 (BP Location: Left Arm)   Pulse 100   Temp 98.2 F (36.8 C) (Oral)   Resp 18   SpO2 95%   PHYSICAL EXAM:  General: Alert, cooperative, no distress at rest but on movt has low back pain, appears stated age.  Head: Normocephalic, without obvious abnormality, atraumatic. Eyes: Conjunctivae clear, anicteric sclerae. Pupils are equal ENT Nares normal. No drainage or sinus tenderness. Lips, mucosa, and tongue normal. No Thrush Neck: Supple, symmetrical, no adenopathy, thyroid: non tender no carotid bruit  and no JVD. Back: No CVA tenderness. Lungs: Clear to auscultation bilaterally. No Wheezing or Rhonchi. No rales. Heart: irregular, rate well controlled . Abdomen: Soft, non-tender,not distended. Bowel sounds normal. No masses Extremities: atraumatic, no cyanosis. No edema. No clubbing Skin: No rashes or lesions. Or bruising Lymph: Cervical, supraclavicular normal. Neurologic: left leg weakness  Pertinent Labs Lab Results CBC    Component Value Date/Time   WBC 18.9 (H) 05/23/2022 0500   RBC 3.36 (L) 05/23/2022 0500   HGB 10.5 (L) 05/23/2022 0500   HGB 14.4 05/03/2017 1037   HCT 32.2 (L) 05/23/2022 0500   HCT 43.3 05/03/2017 1037   PLT 221 05/23/2022 0500   PLT 272 05/03/2017 1037   MCV 95.8 05/23/2022 0500   MCV 93 05/03/2017 1037   MCH 31.3 05/23/2022 0500   MCHC 32.6 05/23/2022 0500   RDW 14.6 05/23/2022 0500   RDW 15.0 05/03/2017 1037   LYMPHSABS 2.5 04/25/2022 0840   LYMPHSABS 2.9 05/03/2017 1037   MONOABS 0.8 04/25/2022 0840   EOSABS 1.0 (H) 04/25/2022 0840   EOSABS 0.6 (H) 05/03/2017 1037   BASOSABS 0.3 (H) 04/25/2022 0840   BASOSABS 0.2 05/03/2017 1037       Latest Ref Rng & Units 05/23/2022    5:00 AM 05/22/2022   10:52 AM 04/25/2022    8:40 AM  CMP  Glucose 70 - 99 mg/dL 152  156  106   BUN 8 - 23 mg/dL 56  67  23   Creatinine 0.61 - 1.24 mg/dL 2.14  2.62  1.34   Sodium 135 - 145 mmol/L 137  141  140   Potassium 3.5 - 5.1 mmol/L 3.4  3.9  4.7   Chloride 98 - 111 mmol/L 108  105  103   CO2 22 - 32 mmol/L '22  26  29   '$ Calcium 8.9 - 10.3 mg/dL 9.3  10.2  10.5   Total Protein 6.0 - 8.3 g/dL   7.3   Total Bilirubin 0.2 - 1.2 mg/dL   0.5   Alkaline Phos 39 - 117 U/L   84   AST 0 - 37 U/L   16   ALT 0 - 53 U/L   13       Microbiology: Recent Results (from the past 240 hour(s))  Blood culture (routine x 2)     Status: None (Preliminary result)   Collection Time: 05/22/22 12:43 PM   Specimen: Right Antecubital; Blood  Result Value Ref Range Status    Specimen Description RIGHT ANTECUBITAL  Final   Special Requests   Final    BOTTLES DRAWN AEROBIC AND ANAEROBIC Blood Culture adequate volume   Culture  Setup Time   Final    GRAM POSITIVE COCCI IN BOTH AEROBIC AND ANAEROBIC BOTTLES Organism ID to follow CRITICAL RESULT CALLED TO, READ BACK BY AND VERIFIED WITH: Wilhelmina Mcardle 05/23/22 2778 MW Performed at [facility], [address] [place], [place]  [address]    Culture GRAM POSITIVE COCCI  Final   Report Status PENDING  Incomplete  Blood Culture ID Panel (Reflexed)     Status: Abnormal   Collection Time: 05/22/22 12:43 PM  Result Value Ref Range Status   Enterococcus faecalis NOT DETECTED NOT DETECTED Final   Enterococcus Faecium NOT DETECTED NOT DETECTED Final   Listeria monocytogenes NOT DETECTED NOT DETECTED Final   Staphylococcus species DETECTED (A) NOT DETECTED Final    Comment: CRITICAL RESULT CALLED TO, READ BACK BY AND VERIFIED WITH: SHEMA HALLHAJI 05/23/22 0906 MW    Staphylococcus aureus (BCID) NOT DETECTED NOT DETECTED Final   Staphylococcus epidermidis NOT DETECTED NOT DETECTED Final   Staphylococcus lugdunensis NOT DETECTED NOT DETECTED Final   Streptococcus species NOT DETECTED NOT DETECTED Final   Streptococcus agalactiae NOT DETECTED NOT DETECTED Final   Streptococcus pneumoniae NOT DETECTED NOT DETECTED Final   Streptococcus pyogenes NOT DETECTED NOT DETECTED Final   A.calcoaceticus-baumannii NOT DETECTED NOT DETECTED Final   Bacteroides fragilis NOT DETECTED NOT DETECTED Final   Enterobacterales NOT DETECTED NOT DETECTED Final   Enterobacter cloacae complex NOT DETECTED NOT DETECTED Final   Escherichia coli NOT DETECTED NOT DETECTED Final   Klebsiella aerogenes NOT DETECTED NOT DETECTED Final   Klebsiella oxytoca NOT DETECTED NOT DETECTED Final   Klebsiella pneumoniae NOT DETECTED NOT DETECTED Final   Proteus species NOT DETECTED NOT DETECTED Final   Salmonella species NOT DETECTED NOT DETECTED  Final   Serratia marcescens NOT DETECTED NOT DETECTED Final   Haemophilus influenzae NOT DETECTED NOT DETECTED Final   Neisseria meningitidis NOT DETECTED NOT DETECTED Final   Pseudomonas aeruginosa NOT DETECTED NOT DETECTED Final   Stenotrophomonas maltophilia NOT DETECTED NOT DETECTED Final   Candida albicans NOT DETECTED NOT DETECTED Final   Candida auris NOT DETECTED NOT DETECTED Final   Candida glabrata NOT DETECTED NOT DETECTED Final   Candida krusei NOT DETECTED NOT DETECTED Final   Candida parapsilosis NOT DETECTED NOT DETECTED Final   Candida tropicalis NOT DETECTED NOT DETECTED Final   Cryptococcus neoformans/gattii NOT DETECTED NOT DETECTED Final    Comment: Performed at [facility], [address] [place], [place] [address]  Blood culture (routine x 2)     Status: None (Preliminary result)   Collection Time: 05/22/22 12:48 PM   Specimen: Left Antecubital; Blood  Result Value Ref Range Status   Specimen Description LEFT ANTECUBITAL  Final   Special Requests   Final    BOTTLES DRAWN AEROBIC AND ANAEROBIC Blood Culture adequate volume   Culture  Setup Time   Final    GRAM POSITIVE COCCI AEROBIC BOTTLE ONLY CRITICAL RESULT CALLED TO, READ BACK BY AND VERIFIED WITH: Wilhelmina Mcardle 05/23/22 7510 MW Performed at [facility], [address] [place], [place] [address]    Culture GRAM POSITIVE COCCI  Final   Report Status PENDING  Incomplete    IMAGING RESULTS: I have personally reviewed the films ? Impression/Recommendation Low back pain with radiation down both legs Left leg more weak than right Has underlying degenerative spine disease and after a recent LESI the pain is worse Leucocytosis Staph bacteremia Concern for early discitis/epidural abscess- because the CT scan was done without contrast he needs better imaging  Will repeat blood culture Will do 2 d echo Currently on vanco- may change to daptomycin because of CKD Pt has spine stimulator  and that prevents from getting MRI  AKI on CKD- combination of infection, NSAID use, dehydration Will  check post void bladder scan  Hypothyrodism on synthorid  CAD s/p stent B/l TKA ? ___________________________________________________ Discussed with patient, son and care team Note:  This document was prepared using Dragon voice recognition software and may include unintentional dictation errors.

## 2022-05-23 NOTE — ED Notes (Signed)
Pt was found to be getting out of bed due to bed alarm going off. Pt states he was trying to urinate. Pt educated on using call light for assistance for his safety, pt verbalized understanding.

## 2022-05-23 NOTE — Progress Notes (Signed)
PHARMACY - PHYSICIAN COMMUNICATION CRITICAL VALUE ALERT - BLOOD CULTURE IDENTIFICATION (BCID)  Anthony [name] is an 86 y.o. male who presented to [facility] on 05/22/2022 with a chief complaint of worsening mid-back pain and fall.  Assessment: 3 out of 4 bottles growing GPC. BCID detects Staphylococcus spp. Resistance detection unavailable.     Name of physician (or Provider) Contacted: Fritzi Mandes, MD  Current antibiotics: No active orders. Received Ceftriaxone 1g x1 in ED  Changes to prescribed antibiotics recommended: Vancomycin per pharmacy.  Recommendations accepted by provider  Results for orders placed or performed during the hospital encounter of 05/22/22  Blood Culture ID Panel (Reflexed) (Collected: 05/22/2022 12:43 PM)  Result Value Ref Range   Enterococcus faecalis NOT DETECTED NOT DETECTED   Enterococcus Faecium NOT DETECTED NOT DETECTED   Listeria monocytogenes NOT DETECTED NOT DETECTED   Staphylococcus species DETECTED (A) NOT DETECTED   Staphylococcus aureus (BCID) NOT DETECTED NOT DETECTED   Staphylococcus epidermidis NOT DETECTED NOT DETECTED   Staphylococcus lugdunensis NOT DETECTED NOT DETECTED   Streptococcus species NOT DETECTED NOT DETECTED   Streptococcus agalactiae NOT DETECTED NOT DETECTED   Streptococcus pneumoniae NOT DETECTED NOT DETECTED   Streptococcus pyogenes NOT DETECTED NOT DETECTED   A.calcoaceticus-baumannii NOT DETECTED NOT DETECTED   Bacteroides fragilis NOT DETECTED NOT DETECTED   Enterobacterales NOT DETECTED NOT DETECTED   Enterobacter cloacae complex NOT DETECTED NOT DETECTED   Escherichia coli NOT DETECTED NOT DETECTED   Klebsiella aerogenes NOT DETECTED NOT DETECTED   Klebsiella oxytoca NOT DETECTED NOT DETECTED   Klebsiella pneumoniae NOT DETECTED NOT DETECTED   Proteus species NOT DETECTED NOT DETECTED   Salmonella species NOT DETECTED NOT DETECTED   Serratia marcescens NOT DETECTED NOT DETECTED   Haemophilus influenzae NOT DETECTED  NOT DETECTED   Neisseria meningitidis NOT DETECTED NOT DETECTED   Pseudomonas aeruginosa NOT DETECTED NOT DETECTED   Stenotrophomonas maltophilia NOT DETECTED NOT DETECTED   Candida albicans NOT DETECTED NOT DETECTED   Candida auris NOT DETECTED NOT DETECTED   Candida glabrata NOT DETECTED NOT DETECTED   Candida krusei NOT DETECTED NOT DETECTED   Candida parapsilosis NOT DETECTED NOT DETECTED   Candida tropicalis NOT DETECTED NOT DETECTED   Cryptococcus neoformans/gattii NOT DETECTED NOT DETECTED   Gretel Acre, PharmD PGY1 Pharmacy Resident 05/23/2022 9:24 AM

## 2022-05-23 NOTE — Telephone Encounter (Signed)
I spoke with Nicki Reaper with [employer]. Per Nicki Reaper, he cannot have an MRI with a dead SCS battery. Nicki Reaper also said they have a new battery out that he thinks would be good for this patient because of the high setting that he uses.

## 2022-05-23 NOTE — Progress Notes (Addendum)
[facility] [place] [facility] NAME: Anthony [name]    MR#:  [id number]  DATE OF BIRTH:  25-Dec-1935  SUBJECTIVE:   No family at bedside. Patient still in the ED. Am complains of back pain although looks bit better and feels little better than yesterday. Seen by neurosurgery earlier. Patient tolerating some PO diet. No fever   VITALS:  Blood pressure 122/75, pulse 83, temperature 98.2 F (36.8 C), temperature source Oral, resp. rate 18, SpO2 95 %.  PHYSICAL EXAMINATION:   GENERAL:  86 y.o.-year-old patient lying in the bed with no acute distress. Obese LUNGS: Normal breath sounds bilaterally, no wheezing, rales, rhonchi.  CARDIOVASCULAR: S1, S2 normal. No murmurs, rubs, or gallops.  ABDOMEN: Soft, nontender, nondistended. Bowel sounds present.  EXTREMITIES: No  edema b/l.  Djd+  NEUROLOGIC: nonfocal  patient is alert and awake SKIN: No obvious rash, lesion, or ulcer.   LABORATORY PANEL:  CBC Recent Labs  Lab 05/23/22 0500  WBC 18.9*  HGB 10.5*  HCT 32.2*  PLT 221    Chemistries  Recent Labs  Lab 05/23/22 0500  NA 137  K 3.4*  CL 108  CO2 22  GLUCOSE 152*  BUN 56*  CREATININE 2.14*  CALCIUM 9.3   Cardiac Enzymes No results for input(s): "TROPONINI" in the last 168 hours. RADIOLOGY:  CT L-SPINE NO CHARGE  Result Date: 05/22/2022 CLINICAL DATA:  Fall EXAM: CT LUMBAR SPINE WITHOUT CONTRAST TECHNIQUE: Multidetector CT imaging of the lumbar spine was performed without intravenous contrast administration. Multiplanar CT image reconstructions were also generated. RADIATION DOSE REDUCTION: This exam was performed according to the departmental dose-optimization program which includes automated exposure control, adjustment of the mA and/or kV according to patient size and/or use of iterative reconstruction technique. COMPARISON:  CT abdomen and pelvis 10/30/2021 FINDINGS: Segmentation: 5 lumbar type vertebrae. Alignment: Trace retrolisthesis  of L3 on L4 and L4 on L5. Vertebrae: Bilateral pars defects at L5 with an additional fracture involving the left pedicle. These findings are unchanged from prior CT abdomen and pelvis from March. Vertebral body heights are otherwise preserved multilevel degenerative changes and vacuum disc phenomenon. Paraspinal and other soft tissues: Redemonstrated is an infrarenal abdominal aortic aneurysm status post endovascular aneurysm repair. The sizes excluded aneurysmal sac measures up to 4.4 cm, previously up to 4.2 cm. Cholelithiasis. Extrarenal pelvis on the right hypodense lesion at the lower pole of the right kidney is incompletely assessed in the absence of IV contrast. Delayed urinary bladder is markedly distended. Disc levels: T11-T12: Moderate disc space loss. Moderate spinal canal stenosis. Severe bilateral neuroforaminal stenosis. T12-L1: Moderate spinal canal stenosis. Mild bilateral facet degenerative change. Circumferential disc bulge. L1-L2: Ligamentum flavum hypertrophy. Moderate disc space loss. Mild bilateral facet degenerative changes. Circumferential disc bulge. Severe spinal canal stenosis. Severe left and moderate right neural foraminal stenosis. L2-L3: Ligamentum flavum hypertrophy. Moderate disc space loss. Mild bilateral facet degenerative changes. Circumferential disc bulge. Severe spinal canal stenosis. L3-L4: Ligamentum flavum hypertrophy. Moderate disc space loss. Moderate bilateral facet degenerative changes. Circumferential disc bulge. Severe spinal canal stenosis. Severe right and moderate left neural foraminal stenosis. L4-L5: Ligamentum flavum hypertrophy. Moderate disc space loss. Severe bilateral facet degenerative changes. Circumferential disc bulge. Severe spinal canal stenosis. Severe bilateral neural foraminal stenosis. L5-S1: Ligamentum flavum hypertrophy. Fracture fragments project into the posterior aspect of the spinal canal at this level. Moderate disc space loss. Severe  bilateral facet degenerative changes. Circumferential disc bulge. Severe spinal canal stenosis. Severe bilateral  neural foraminal stenosis. IMPRESSION: 1.  No evidence of acute osseous trauma in the lumbar spine. 2. Multilevel severe degenerative changes throughout the lumbar spine with severe spinal canal stenosis from the L1-L5 level and severe bilateral neuroforaminal stenosis from the L3-S1 level. Consider further evaluation with a dedicated lumbar spine MRI for bettera characterization of the degree of spinal canal stenosis, if clinically warranted. 3. Chronic bilateral pars defects at L5, as well as a chronic fracture through the left pedicle. Electronically Signed   By: Marin Roberts M.D.   On: 05/22/2022 16:57   CT ABDOMEN PELVIS WO CONTRAST  Result Date: 05/22/2022 CLINICAL DATA:  85 year old male with abdominal pain EXAM: CT ABDOMEN AND PELVIS WITHOUT CONTRAST TECHNIQUE: Multidetector CT imaging of the abdomen and pelvis was performed following the standard protocol without IV contrast. RADIATION DOSE REDUCTION: This exam was performed according to the departmental dose-optimization program which includes automated exposure control, adjustment of the mA and/or kV according to patient size and/or use of iterative reconstruction technique. COMPARISON:  10/30/2021, 04/09/2020 FINDINGS: Lower chest: No acute finding of the lower chest Hepatobiliary: Low-density cyst of the caudate, 15 mm, most likely benign biliary cyst. Cholelithiasis. No inflammatory changes. Pancreas: Unremarkable Spleen: Unremarkable Adrenals/Urinary Tract: - Right adrenal gland:  Unremarkable - Left adrenal gland: Unremarkable. - Right kidney: No hydronephrosis, nephrolithiasis, inflammation, or ureteral dilation. Multiple renal cysts/lesions which are not completely characterized on the current CT given the absence of contrast. Largest at the inferior cortex measures 4.3 cm, unchanged. - Left Kidney: No hydronephrosis,  nephrolithiasis, inflammation, or ureteral dilation. Renal lesion on the lateral cortex, incompletely characterized, representing a cyst on prior CT. - Urinary Bladder: Urinary bladder partially distended. Stomach/Bowel: - Stomach: Unremarkable. - Small bowel: Unremarkable - Appendix: Normal. - Colon: Mild to moderate stool burden. No evidence of obstruction. Left-sided diverticular disease. No inflammatory changes. Vascular/Lymphatic: Redemonstration of EVAR. Diameter of the excluded aneurysm sac estimated 4.5 cm on this noncontrast CT, which is essentially unchanged from the prior CT of 10/30/2021. No inflammatory changes. Mild atherosclerosis.  Atherosclerosis of the iliac arteries. No lymphadenopathy. Reproductive: Transverse diameter of the prostate estimated 4.9 cm Other: Generator within the posterior soft tissues of the left gluteal region. Leads terminate in the posterior canal in the lower thoracic region. Musculoskeletal: Degenerative changes of the thoracolumbar spine. Bilateral L5 pars defect. Similar appearance calcified spurring associated with the facets at the L5 level resulting in canal narrowing approximately 50%. Vacuum disc phenomenon present throughout all levels of the lumbar spine. No displaced fracture. IMPRESSION: CT negative for acute finding that would account for abdominal pain. Urinary bladder partially distended with prostatomegaly, potentially representing chronic bladder outlet obstruction. Aortic Atherosclerosis (ICD10-I70.0). Additional ancillary findings as above. Electronically Signed   By: Corrie Mckusick D.O.   On: 05/22/2022 15:17    Assessment and Plan  AARONJAMES KELSAY is a 86 y.o. male with medical history significant of atrial flutter is status post ablation, chronic back pain status post stent spine stimulator which is malfunctioning, hypertension, hyperlipidemia, hypothyroidism, obesity, obstructive sleep apnea on CPAP comes to the emergency room with complaints of back  pain lower radiating bilaterally to both legs going down along with increased urinary frequency.  Severe sepsis present on admission, unclear source --BC 3/4 bottles positive for Staph species -- pharmacy for vancomycin consult -- could be UTI, back pain with possible discitis recent procedure -- patient was found to have tachycardia count, elevated white count of 21K, lactic acid of 2.0, increased creatinine to  2.6 to baseline 1.3 -- continue IV fluids --  trend lactate level  -- infectious disease consultation with Dr. Steva Ready -- urine culture pending   Chronic back pain with severe degenerative arthritis chronic pain/history of spinal cord pain stimulator-- malfunctioning --patient follows with pain clinic Dr. Zollie Scale as outpatient recently got lumbar spinal steroid injection on 20 September -- will check ESR, CRP -- neurosurgery consultation with Dr. Cari Caraway given increase in weakness more on the left and right-- recommends brace and follow-up outpatient for malfunctioning pain stimulator -- Unable to do MRI given stimulator -- PRN PO and IV pain meds -- will add muscle relaxants-- patient is on gabapentin   Acute on chronic kidney disease stage IIIB --baseline creatinine 1.3 -- came in with creatinine of 2.62--2.18 -- IV fluids, monitor input output, monitor creatinine with metabolic panel --avoid nephrotoxic agents -- consider nephrology consultation needed   Leukocytosis -- appears reactive due to steroids versus to infection --WBC 21K-18K   Elevated troponin appears demand ischemia in the setting of sepsis -- patient denies any chest pain -he has-history of CAD with stent placed in LAD few years ago -- history of atrial flutter status post ambulation in the past. Patient follows with [facility] cardiology -- will continue aspirin and Plavix along with statins   Hypothyroidism -- continue Synthroid   Hyperlipidemia  --continue Zetia   DOT to see patient. TOC for  discharge planning    Advance Care Planning:   Code Status: DNR patient agrees   Consults: neurosurgery, ID   Family Communication: left VM son Braedin Millhouse   DVT Prophylaxis : Lovenox Level of care: Med-Surg Status is: Inpatient Remains inpatient appropriate because: sepsis    TOTAL TIME TAKING CARE OF THIS PATIENT: 35 minutes.  >50% time spent on counselling and coordination of care  Note: This dictation was prepared with Dragon dictation along with smaller phrase technology. Any transcriptional errors that result from this process are unintentional.  Fritzi Mandes M.D    [facility]   CC: Primary care physician; Ria Bush, MD

## 2022-05-23 NOTE — Consult Note (Signed)
Pharmacy Antibiotic Note  Anthony [name] is a 86 y.o. male admitted on 05/22/2022 with bacteremia. PMH significant for AFlutter s/p ablation, chronic back pain s/p stent spine stimulator (currently malfunctioning), HTN, HLD, hypothyroidism, obesity, OSA on CPAP. Patient presenting after mechanical fall on 9/25 and found to have AKI with Scr 2.62 in ED with recovery to 2.14 after IV fluid administration (baseline 1.1-1.4). Of note, patient had recent lumbar epidural injection on 9/20 and is being followed by neuro. Pharmacy has been consulted for Vancomycin dosing.  Plan: Day 1 of antibiotics Give Vancomycin 2000 mg IV x1.  Follow up Scr with AM labs to assess renal function and determine maintenance regimen. Continue to monitor renal function and follow culture results    Temp (24hrs), Avg:98.9 F (37.2 C), Min:98.2 F (36.8 C), Max:100.2 F (37.9 C)  Recent Labs  Lab 05/22/22 1052 05/22/22 1243 05/22/22 1530 05/23/22 0500  WBC 21.6*  --   --  18.9*  CREATININE 2.62*  --   --  2.14*  LATICACIDVEN  --  1.2 2.0*  --     Estimated Creatinine Clearance: 34.7 mL/min (A) (by C-G formula based on SCr of 2.14 mg/dL (H)).    Allergies  Allergen Reactions   Questran [Cholestyramine]     Patient not aware of an allergy to this medicine.   Atorvastatin     Muscle pain   Gemfibrozil Other (See Comments)    Muscle pain.   Metformin And Related     Dizziness   Rosuvastatin     Muscle pain    Antimicrobials this admission: 9/25 Ceftriaxone 1g x1 in ED 9/26 Vancomycin >>   Dose adjustments this admission: N/A  Microbiology results: 9/25 BCx: 3 out of 4 bottles growing GPC. BCID detects Staphylococcus spp. 9/25 UCx: in process   Thank you for allowing pharmacy to be a part of this patient's care.  Gretel Acre, PharmD PGY1 Pharmacy Resident 05/23/2022 9:39 AM

## 2022-05-23 NOTE — ED Notes (Signed)
Pt requesting pain medication at this time.

## 2022-05-24 DIAGNOSIS — N179 Acute kidney failure, unspecified: Secondary | ICD-10-CM | POA: Diagnosis not present

## 2022-05-24 DIAGNOSIS — R7881 Bacteremia: Secondary | ICD-10-CM | POA: Diagnosis not present

## 2022-05-24 DIAGNOSIS — A419 Sepsis, unspecified organism: Secondary | ICD-10-CM | POA: Diagnosis not present

## 2022-05-24 DIAGNOSIS — B957 Other staphylococcus as the cause of diseases classified elsewhere: Secondary | ICD-10-CM | POA: Diagnosis not present

## 2022-05-24 DIAGNOSIS — R6520 Severe sepsis without septic shock: Secondary | ICD-10-CM | POA: Diagnosis not present

## 2022-05-24 LAB — CBC
HCT: 29.3 % — ABNORMAL LOW (ref 39.0–52.0)
Hemoglobin: 9.8 g/dL — ABNORMAL LOW (ref 13.0–17.0)
MCH: 31.4 pg (ref 26.0–34.0)
MCHC: 33.4 g/dL (ref 30.0–36.0)
MCV: 93.9 fL (ref 80.0–100.0)
Platelets: 208 10*3/uL (ref 150–400)
RBC: 3.12 MIL/uL — ABNORMAL LOW (ref 4.22–5.81)
RDW: 14.6 % (ref 11.5–15.5)
WBC: 19.1 10*3/uL — ABNORMAL HIGH (ref 4.0–10.5)
nRBC: 0 % (ref 0.0–0.2)

## 2022-05-24 LAB — BASIC METABOLIC PANEL
Anion gap: 5 (ref 5–15)
BUN: 55 mg/dL — ABNORMAL HIGH (ref 8–23)
CO2: 24 mmol/L (ref 22–32)
Calcium: 9.5 mg/dL (ref 8.9–10.3)
Chloride: 110 mmol/L (ref 98–111)
Creatinine, Ser: 2.25 mg/dL — ABNORMAL HIGH (ref 0.61–1.24)
GFR, Estimated: 28 mL/min — ABNORMAL LOW (ref >60)
Glucose, Bld: 163 mg/dL — ABNORMAL HIGH (ref 70–99)
Potassium: 3.8 mmol/L (ref 3.5–5.1)
Sodium: 139 mmol/L (ref 135–145)

## 2022-05-24 LAB — URINE CULTURE: Culture: 20000 — AB

## 2022-05-24 LAB — VANCOMYCIN, RANDOM: Vancomycin Rm: 8 ug/mL

## 2022-05-24 LAB — CK: Total CK: 110 U/L (ref 49–397)

## 2022-05-24 MED ORDER — EZETIMIBE 10 MG PO TABS
10.0000 mg | ORAL_TABLET | Freq: Every day | ORAL | Status: DC
  Administered 2022-05-24 – 2022-06-07 (×15): 10 mg via ORAL
  Filled 2022-05-24 (×15): qty 1

## 2022-05-24 MED ORDER — AMLODIPINE BESYLATE 5 MG PO TABS
5.0000 mg | ORAL_TABLET | Freq: Every day | ORAL | Status: DC
  Administered 2022-05-24 – 2022-06-08 (×14): 5 mg via ORAL
  Filled 2022-05-24 (×14): qty 1

## 2022-05-24 MED ORDER — SODIUM CHLORIDE 0.9 % IV SOLN
10.0000 mg/kg | Freq: Every day | INTRAVENOUS | Status: DC
  Administered 2022-05-24: 950 mg via INTRAVENOUS
  Filled 2022-05-24 (×2): qty 19

## 2022-05-24 MED ORDER — POLYETHYLENE GLYCOL 3350 17 G PO PACK
17.0000 g | PACK | Freq: Every day | ORAL | Status: DC
  Administered 2022-05-24 – 2022-05-25 (×2): 17 g via ORAL
  Filled 2022-05-24 (×2): qty 1

## 2022-05-24 MED ORDER — TAMSULOSIN HCL 0.4 MG PO CAPS
0.4000 mg | ORAL_CAPSULE | Freq: Every day | ORAL | Status: DC
  Administered 2022-05-24 – 2022-06-08 (×15): 0.4 mg via ORAL
  Filled 2022-05-24 (×15): qty 1

## 2022-05-24 MED ORDER — GABAPENTIN 300 MG PO CAPS
600.0000 mg | ORAL_CAPSULE | Freq: Every day | ORAL | Status: DC
  Administered 2022-05-24 – 2022-06-07 (×15): 600 mg via ORAL
  Filled 2022-05-24 (×15): qty 2

## 2022-05-24 MED ORDER — SENNA 8.6 MG PO TABS
1.0000 | ORAL_TABLET | Freq: Every day | ORAL | Status: DC
  Administered 2022-05-24 – 2022-05-26 (×3): 8.6 mg via ORAL
  Filled 2022-05-24 (×3): qty 1

## 2022-05-24 NOTE — Consult Note (Signed)
Date of Admission:  05/22/2022     ID: Anthony [name] is a 86 y.o. male  Principal Problem:   Sepsis ([place]) Active Problems:   Coag negative Staphylococcus bacteremia    Subjective: Continues to have back pain Bladder scan last night showed 900cc of urine and he had straight cath and it was 1600cc  Medications:   enoxaparin (LOVENOX) injection  0.5 mg/kg Subcutaneous QHS   levothyroxine  175 mcg Oral Q0600   linaclotide  290 mcg Oral QAC breakfast   polyethylene glycol  17 g Oral Daily   senna  1 tablet Oral Daily   vancomycin variable dose per unstable renal function (pharmacist dosing)   Does not apply See admin instructions    Objective:  Patient Vitals for the past 24 hrs:  BP Temp Temp src Pulse Resp SpO2  05/24/22 1702 (!) 165/92 98.8 F (37.1 C) -- (!) 103 18 92 %  05/24/22 0500 -- -- -- -- -- 99 %  05/24/22 0428 137/64 97.8 F (36.6 C) Oral (!) 102 18 (!) 83 %  05/23/22 2104 135/64 98.1 F (36.7 C) Oral 99 18 93 %  05/23/22 2028 (!) 142/94 98.7 F (37.1 C) -- 91 20 94 %     PHYSICAL EXAM:  General: Alert, cooperative, some  distress on movt, Lungs: Clear to auscultation bilaterally. No Wheezing or Rhonchi. No rales. Heart: Regular rate and rhythm, no murmur, rub or gallop. Abdomen: Soft, non-tender,not distended. Bowel sounds normal. No masses Extremities: atraumatic, no cyanosis. No edema. No clubbing Skin: No rashes or lesions. Or bruising Lymph: Cervical, supraclavicular normal. Neurologic: left leg weakness   Lab Results Recent Labs    05/23/22 0500 05/24/22 0325  WBC 18.9* 19.1*  HGB 10.5* 9.8*  HCT 32.2* 29.3*  NA 137 139  K 3.4* 3.8  CL 108 110  CO2 22 24  BUN 56* 55*  CREATININE 2.14* 2.25*   Liver Panel No results for input(s): "PROT", "ALBUMIN", "AST", "ALT", "ALKPHOS", "BILITOT", "BILIDIR", "IBILI" in the last 72 hours. Sedimentation Rate Recent Labs    05/22/22 1530  ESRSEDRATE 10   C-Reactive Protein Recent Labs     05/22/22 1530  CRP 42.2*    Microbiology:  Studies/Results: CT L-SPINE NO CHARGE  Result Date: 05/22/2022 CLINICAL DATA:  Fall EXAM: CT LUMBAR SPINE WITHOUT CONTRAST TECHNIQUE: Multidetector CT imaging of the lumbar spine was performed without intravenous contrast administration. Multiplanar CT image reconstructions were also generated. RADIATION DOSE REDUCTION: This exam was performed according to the departmental dose-optimization program which includes automated exposure control, adjustment of the mA and/or kV according to patient size and/or use of iterative reconstruction technique. COMPARISON:  CT abdomen and pelvis 10/30/2021 FINDINGS: Segmentation: 5 lumbar type vertebrae. Alignment: Trace retrolisthesis of L3 on L4 and L4 on L5. Vertebrae: Bilateral pars defects at L5 with an additional fracture involving the left pedicle. These findings are unchanged from prior CT abdomen and pelvis from March. Vertebral body heights are otherwise preserved multilevel degenerative changes and vacuum disc phenomenon. Paraspinal and other soft tissues: Redemonstrated is an infrarenal abdominal aortic aneurysm status post endovascular aneurysm repair. The sizes excluded aneurysmal sac measures up to 4.4 cm, previously up to 4.2 cm. Cholelithiasis. Extrarenal pelvis on the right hypodense lesion at the lower pole of the right kidney is incompletely assessed in the absence of IV contrast. Delayed urinary bladder is markedly distended. Disc levels: T11-T12: Moderate disc space loss. Moderate spinal canal stenosis. Severe bilateral neuroforaminal stenosis. T12-L1: Moderate spinal  canal stenosis. Mild bilateral facet degenerative change. Circumferential disc bulge. L1-L2: Ligamentum flavum hypertrophy. Moderate disc space loss. Mild bilateral facet degenerative changes. Circumferential disc bulge. Severe spinal canal stenosis. Severe left and moderate right neural foraminal stenosis. L2-L3: Ligamentum flavum hypertrophy.  Moderate disc space loss. Mild bilateral facet degenerative changes. Circumferential disc bulge. Severe spinal canal stenosis. L3-L4: Ligamentum flavum hypertrophy. Moderate disc space loss. Moderate bilateral facet degenerative changes. Circumferential disc bulge. Severe spinal canal stenosis. Severe right and moderate left neural foraminal stenosis. L4-L5: Ligamentum flavum hypertrophy. Moderate disc space loss. Severe bilateral facet degenerative changes. Circumferential disc bulge. Severe spinal canal stenosis. Severe bilateral neural foraminal stenosis. L5-S1: Ligamentum flavum hypertrophy. Fracture fragments project into the posterior aspect of the spinal canal at this level. Moderate disc space loss. Severe bilateral facet degenerative changes. Circumferential disc bulge. Severe spinal canal stenosis. Severe bilateral neural foraminal stenosis. IMPRESSION: 1.  No evidence of acute osseous trauma in the lumbar spine. 2. Multilevel severe degenerative changes throughout the lumbar spine with severe spinal canal stenosis from the L1-L5 level and severe bilateral neuroforaminal stenosis from the L3-S1 level. Consider further evaluation with a dedicated lumbar spine MRI for bettera characterization of the degree of spinal canal stenosis, if clinically warranted. 3. Chronic bilateral pars defects at L5, as well as a chronic fracture through the left pedicle. Electronically Signed   By: Marin Roberts M.D.   On: 05/22/2022 16:57   CT ABDOMEN PELVIS WO CONTRAST  Result Date: 05/22/2022 CLINICAL DATA:  86 year old male with abdominal pain EXAM: CT ABDOMEN AND PELVIS WITHOUT CONTRAST TECHNIQUE: Multidetector CT imaging of the abdomen and pelvis was performed following the standard protocol without IV contrast. RADIATION DOSE REDUCTION: This exam was performed according to the departmental dose-optimization program which includes automated exposure control, adjustment of the mA and/or kV according to patient size  and/or use of iterative reconstruction technique. COMPARISON:  10/30/2021, 04/09/2020 FINDINGS: Lower chest: No acute finding of the lower chest Hepatobiliary: Low-density cyst of the caudate, 15 mm, most likely benign biliary cyst. Cholelithiasis. No inflammatory changes. Pancreas: Unremarkable Spleen: Unremarkable Adrenals/Urinary Tract: - Right adrenal gland:  Unremarkable - Left adrenal gland: Unremarkable. - Right kidney: No hydronephrosis, nephrolithiasis, inflammation, or ureteral dilation. Multiple renal cysts/lesions which are not completely characterized on the current CT given the absence of contrast. Largest at the inferior cortex measures 4.3 cm, unchanged. - Left Kidney: No hydronephrosis, nephrolithiasis, inflammation, or ureteral dilation. Renal lesion on the lateral cortex, incompletely characterized, representing a cyst on prior CT. - Urinary Bladder: Urinary bladder partially distended. Stomach/Bowel: - Stomach: Unremarkable. - Small bowel: Unremarkable - Appendix: Normal. - Colon: Mild to moderate stool burden. No evidence of obstruction. Left-sided diverticular disease. No inflammatory changes. Vascular/Lymphatic: Redemonstration of EVAR. Diameter of the excluded aneurysm sac estimated 4.5 cm on this noncontrast CT, which is essentially unchanged from the prior CT of 10/30/2021. No inflammatory changes. Mild atherosclerosis.  Atherosclerosis of the iliac arteries. No lymphadenopathy. Reproductive: Transverse diameter of the prostate estimated 4.9 cm Other: Generator within the posterior soft tissues of the left gluteal region. Leads terminate in the posterior canal in the lower thoracic region. Musculoskeletal: Degenerative changes of the thoracolumbar spine. Bilateral L5 pars defect. Similar appearance calcified spurring associated with the facets at the L5 level resulting in canal narrowing approximately 50%. Vacuum disc phenomenon present throughout all levels of the lumbar spine. No  displaced fracture. IMPRESSION: CT negative for acute finding that would account for abdominal pain. Urinary bladder partially distended with prostatomegaly,  potentially representing chronic bladder outlet obstruction. Aortic Atherosclerosis (ICD10-I70.0). Additional ancillary findings as above. Electronically Signed   By: Corrie Mckusick D.O.   On: 05/22/2022 15:17     Assessment/Plan: Low back pain with radiation down both legs Left leg more weak than right Has underlying degenerative spine disease and after a recent LESI the pain is worse Leucocytosis Staph bacteremia Concern for early discitis/epidural abscess- because the CT scan was done without contrast he needs better imaging   repeat blood culture Will do 2 d echo Currently on vanco- will change to daptomycin because of CKD Pt has spine stimulator and that prevents from getting MRI   AKI on CKD- combination of infection, NSAID use, dehydration and retention post void bladder scan had 900cc of urine and staright cath was 1600cc   Hypothyrodism on synthorid   CAD s/p stent B/l TKA  Discussed the management with patient and care team

## 2022-05-24 NOTE — Progress Notes (Signed)
PROGRESS NOTE    Anthony [name]  MGQ:[id number] DOB: Dec 02, 1935 DOA: 05/22/2022 PCP: Ria Bush, MD   Brief Narrative: This 86 yrs old male with medical history significant of atrial flutter,  status post ablation, chronic back pain status post stent spine stimulator which is malfunctioning, hypertension, hyperlipidemia, hypothyroidism, obesity, obstructive sleep apnea on CPAP comes to the emergency room with complaints of lower back pain radiating bilaterally to both legs,  going down along with increased urinary frequency.  Patient is found to have Staph aureus bacteremia, back pain is concerning for epidural abscess/early discitis.  Infectious diseases consulted recommended IV antibiotics.  Neurosurgery is consulted states no neurosurgical intervention needed, recommended IV steroids and IV antibiotics.  If additional work-up is needed could consider MRI versus CT myelogram.  Assessment & Plan:   Principal Problem:   Sepsis ([place]) Active Problems:   Coag negative Staphylococcus bacteremia  Severe sepsis POA:  Staph bacteremia Continue IV vancomycin. It could be UTI, back pain with possible discitis given recent procedure. He was found to have tachycardia , elevated white count of 21K, lactic acid of 2.0, increased creatinine to 2.6 to baseline 1.3. Continue IV fluids as per sepsis protocol. Lactic acid normalized, 1.2> 2.0 Infectious disease Dr. Delaine Lame consulted. Urine culture 20 K of gram-positive cocci. Repeat blood cultures, obtain 2D echo.   Chronic back pain with severe degenerative arthritis: Chronic pain /history of spinal cord pain stimulator-- malfunctioning. Patient follows with pain clinic Dr. Zollie Scale as outpatient, He recently underwent lumbar spinal steroid injection on 20 September CRP 42.2, ESR 10.0 Neurosurgery consultation with Dr. Cari Caraway completed, given increase in weakness more on the left and right-- recommends brace and follow-up outpatient for  malfunctioning pain stimulator. Unable to do MRI given stimulator Continue PRN PO and IV pain meds Continue muscle relaxants-- patient is on gabapentin.   AKI on CKD stage IIIb: Baseline creatinine 1.3 Presented  with creatinine of 2.62--2.18>2.25 Continue IV fluids, monitor input output, monitor creatinine with metabolic panel. Avoid nephrotoxic agents Consider nephrology consult if needed.   Leukocytosis: Likely reactive due to steroids versus to infection. --WBC 21K-18K-19.1   Elevated troponin: Likely demand ischemia in the setting of sepsis: Patient denies any chest pain He has hx.  of CAD with stent placed in LAD few years ago He has hx. of atrial flutter status post ambulation in the past. Patient follows with [facility] cardiology Continue aspirin Plavix and statins.   Hypothyroidism Continue Synthroid.   Hyperlipidemia  Continue Zetia.    DVT prophylaxis: Lovenox Code Status: DNR Family Communication: No family at bedside Disposition Plan:   Status is: Inpatient Remains inpatient appropriate because: Admitted for worsening lower back pain radiating towards the lower extremities.  Blood cultures positive for Staph aureus , ID and Neurosurgery consulted started on IV vancomycin.   Consultants:  Neurosurgery Infectious diseases  Procedures: None Antimicrobials: Vancomycin  Subjective: Patient was seen and examined at bedside.  Overnight events noted. Patient was unable to get out of bed due to worsening low back pain.  Objective: Vitals:   05/23/22 2028 05/23/22 2104 05/24/22 0428 05/24/22 0500  BP: (!) 142/94 135/64 137/64   Pulse: 91 99 (!) 102   Resp: '20 18 18   ' Temp: 98.7 F (37.1 C) 98.1 F (36.7 C) 97.8 F (36.6 C)   TempSrc:  Oral Oral   SpO2: 94% 93% (!) 83% 99%    Intake/Output Summary (Last 24 hours) at 05/24/2022 1158 Last data filed at 05/24/2022 0600 Gross per  24 hour  Intake 1604.79 ml  Output 2550 ml  Net -945.21 ml   There were no  vitals filed for this visit.  Examination:  General exam: Appears comfortable, not in any acute distress.  Deconditioned, morbidly obese Respiratory system: CTA bilaterally, no wheezing, no crackles, normal respiratory effort. Cardiovascular system: S1 & S2 heard, regular rate and rhythm, no murmur.   Gastrointestinal system: Abdomen is soft, non tender, non distended, BS+ Central nervous system: Alert and oriented X 3. No focal neurological deficits. Back: Severe tenderness noted in the lower back Extremities: No edema, no cyanosis, no clubbing. Skin: No rashes, lesions or ulcers Psychiatry: Judgement and insight appear normal. Mood & affect appropriate.     Data Reviewed: I have personally reviewed following labs and imaging studies  CBC: Recent Labs  Lab 05/22/22 1052 05/23/22 0500 05/24/22 0325  WBC 21.6* 18.9* 19.1*  HGB 11.8* 10.5* 9.8*  HCT 36.2* 32.2* 29.3*  MCV 95.3 95.8 93.9  PLT 263 221 756   Basic Metabolic Panel: Recent Labs  Lab 05/22/22 1052 05/23/22 0500 05/24/22 0325  NA 141 137 139  K 3.9 3.4* 3.8  CL 105 108 110  CO2 '26 22 24  ' GLUCOSE 156* 152* 163*  BUN 67* 56* 55*  CREATININE 2.62* 2.14* 2.25*  CALCIUM 10.2 9.3 9.5   GFR: Estimated Creatinine Clearance: 33 mL/min (A) (by C-G formula based on SCr of 2.25 mg/dL (H)). Liver Function Tests: No results for input(s): "AST", "ALT", "ALKPHOS", "BILITOT", "PROT", "ALBUMIN" in the last 168 hours. No results for input(s): "LIPASE", "AMYLASE" in the last 168 hours. No results for input(s): "AMMONIA" in the last 168 hours. Coagulation Profile: No results for input(s): "INR", "PROTIME" in the last 168 hours. Cardiac Enzymes: No results for input(s): "CKTOTAL", "CKMB", "CKMBINDEX", "TROPONINI" in the last 168 hours. BNP (last 3 results) No results for input(s): "PROBNP" in the last 8760 hours. HbA1C: No results for input(s): "HGBA1C" in the last 72 hours. CBG: No results for input(s): "GLUCAP" in the  last 168 hours. Lipid Profile: No results for input(s): "CHOL", "HDL", "LDLCALC", "TRIG", "CHOLHDL", "LDLDIRECT" in the last 72 hours. Thyroid Function Tests: No results for input(s): "TSH", "T4TOTAL", "FREET4", "T3FREE", "THYROIDAB" in the last 72 hours. Anemia Panel: No results for input(s): "VITAMINB12", "FOLATE", "FERRITIN", "TIBC", "IRON", "RETICCTPCT" in the last 72 hours. Sepsis Labs: Recent Labs  Lab 05/22/22 1243 05/22/22 1530  LATICACIDVEN 1.2 2.0*    Recent Results (from the past 240 hour(s))  Blood culture (routine x 2)     Status: Abnormal (Preliminary result)   Collection Time: 05/22/22 12:43 PM   Specimen: Right Antecubital; Blood  Result Value Ref Range Status   Specimen Description   Final    RIGHT ANTECUBITAL Performed at [facility], [address], [place], [place] [address]    Special Requests   Final    BOTTLES DRAWN AEROBIC AND ANAEROBIC Blood Culture adequate volume Performed at [facility], [address] [place], [place] [address]    Culture  Setup Time   Final    GRAM POSITIVE COCCI IN BOTH AEROBIC AND ANAEROBIC BOTTLES CRITICAL RESULT CALLED TO, READ BACK BY AND VERIFIED WITH: [facility] HALLHAJI 05/23/22 0906 MW    Culture (A)  Final    STAPHYLOCOCCUS CAPITIS SUSCEPTIBILITIES TO FOLLOW Performed at [facility], [place] [address], [place], [place] [address]    Report Status PENDING  Incomplete  Blood Culture ID Panel (Reflexed)     Status: Abnormal  Collection Time: 05/22/22 12:43 PM  Result Value Ref Range Status   Enterococcus faecalis NOT DETECTED NOT DETECTED Final   Enterococcus Faecium NOT DETECTED NOT DETECTED Final   Listeria monocytogenes NOT DETECTED NOT DETECTED Final   Staphylococcus species DETECTED (A) NOT DETECTED Final    Comment: CRITICAL RESULT CALLED TO, READ BACK BY AND VERIFIED WITH: SHEMA HALLHAJI 05/23/22 0906 MW    Staphylococcus aureus (BCID) NOT DETECTED NOT DETECTED Final   Staphylococcus  epidermidis NOT DETECTED NOT DETECTED Final   Staphylococcus lugdunensis NOT DETECTED NOT DETECTED Final   Streptococcus species NOT DETECTED NOT DETECTED Final   Streptococcus agalactiae NOT DETECTED NOT DETECTED Final   Streptococcus pneumoniae NOT DETECTED NOT DETECTED Final   Streptococcus pyogenes NOT DETECTED NOT DETECTED Final   A.calcoaceticus-baumannii NOT DETECTED NOT DETECTED Final   Bacteroides fragilis NOT DETECTED NOT DETECTED Final   Enterobacterales NOT DETECTED NOT DETECTED Final   Enterobacter cloacae complex NOT DETECTED NOT DETECTED Final   Escherichia coli NOT DETECTED NOT DETECTED Final   Klebsiella aerogenes NOT DETECTED NOT DETECTED Final   Klebsiella oxytoca NOT DETECTED NOT DETECTED Final   Klebsiella pneumoniae NOT DETECTED NOT DETECTED Final   Proteus species NOT DETECTED NOT DETECTED Final   Salmonella species NOT DETECTED NOT DETECTED Final   Serratia marcescens NOT DETECTED NOT DETECTED Final   Haemophilus influenzae NOT DETECTED NOT DETECTED Final   Neisseria meningitidis NOT DETECTED NOT DETECTED Final   Pseudomonas aeruginosa NOT DETECTED NOT DETECTED Final   Stenotrophomonas maltophilia NOT DETECTED NOT DETECTED Final   Candida albicans NOT DETECTED NOT DETECTED Final   Candida auris NOT DETECTED NOT DETECTED Final   Candida glabrata NOT DETECTED NOT DETECTED Final   Candida krusei NOT DETECTED NOT DETECTED Final   Candida parapsilosis NOT DETECTED NOT DETECTED Final   Candida tropicalis NOT DETECTED NOT DETECTED Final   Cryptococcus neoformans/gattii NOT DETECTED NOT DETECTED Final    Comment: Performed at [facility], [address] [place], [place] [address]  Blood culture (routine x 2)     Status: Abnormal (Preliminary result)   Collection Time: 05/22/22 12:48 PM   Specimen: Left Antecubital; Blood  Result Value Ref Range Status   Specimen Description   Final    LEFT ANTECUBITAL Performed at [facility], [address], [place], [place] [address]    Special Requests   Final    BOTTLES DRAWN AEROBIC AND ANAEROBIC Blood Culture adequate volume Performed at [facility], [address] [place], [place] [address]    Culture  Setup Time   Final    GRAM POSITIVE COCCI IN BOTH AEROBIC AND ANAEROBIC BOTTLES CRITICAL RESULT CALLED TO, READ BACK BY AND VERIFIED WITH: [facility] HALLHAJI 05/23/22 7619 MW Performed at [facility], [address] [place], [place] [address]    Culture STAPHYLOCOCCUS CAPITIS (A)  Final   Report Status PENDING  Incomplete  Urine Culture     Status: Abnormal (Preliminary result)   Collection Time: 05/22/22  3:28 PM   Specimen: Urine, Clean Catch  Result Value Ref Range Status   Specimen Description   Final    URINE, CLEAN CATCH Performed at [facility], [address] [place], [place] [address]    Special Requests   Final    NONE Performed at [facility], [address] [place], [place] [address]    Culture (A)  Final    20,000 COLONIES/mL GRAM POSITIVE COCCI CULTURE REINCUBATED FOR BETTER GROWTH  Performed at [facility], [place] [address], [place], [place] [address]    Report Status PENDING  Incomplete    Radiology Studies: CT L-SPINE NO CHARGE  Result Date: 05/22/2022 CLINICAL DATA:  Fall EXAM: CT LUMBAR SPINE WITHOUT CONTRAST TECHNIQUE: Multidetector CT imaging of the lumbar spine was performed without intravenous contrast administration. Multiplanar CT image reconstructions were also generated. RADIATION DOSE REDUCTION: This exam was performed according to the departmental dose-optimization program which includes automated exposure control, adjustment of the mA and/or kV according to patient size and/or use of iterative reconstruction technique. COMPARISON:  CT abdomen and pelvis 10/30/2021 FINDINGS: Segmentation: 5 lumbar type vertebrae. Alignment: Trace retrolisthesis of L3 on L4 and L4 on L5. Vertebrae: Bilateral pars defects at  L5 with an additional fracture involving the left pedicle. These findings are unchanged from prior CT abdomen and pelvis from March. Vertebral body heights are otherwise preserved multilevel degenerative changes and vacuum disc phenomenon. Paraspinal and other soft tissues: Redemonstrated is an infrarenal abdominal aortic aneurysm status post endovascular aneurysm repair. The sizes excluded aneurysmal sac measures up to 4.4 cm, previously up to 4.2 cm. Cholelithiasis. Extrarenal pelvis on the right hypodense lesion at the lower pole of the right kidney is incompletely assessed in the absence of IV contrast. Delayed urinary bladder is markedly distended. Disc levels: T11-T12: Moderate disc space loss. Moderate spinal canal stenosis. Severe bilateral neuroforaminal stenosis. T12-L1: Moderate spinal canal stenosis. Mild bilateral facet degenerative change. Circumferential disc bulge. L1-L2: Ligamentum flavum hypertrophy. Moderate disc space loss. Mild bilateral facet degenerative changes. Circumferential disc bulge. Severe spinal canal stenosis. Severe left and moderate right neural foraminal stenosis. L2-L3: Ligamentum flavum hypertrophy. Moderate disc space loss. Mild bilateral facet degenerative changes. Circumferential disc bulge. Severe spinal canal stenosis. L3-L4: Ligamentum flavum hypertrophy. Moderate disc space loss. Moderate bilateral facet degenerative changes. Circumferential disc bulge. Severe spinal canal stenosis. Severe right and moderate left neural foraminal stenosis. L4-L5: Ligamentum flavum hypertrophy. Moderate disc space loss. Severe bilateral facet degenerative changes. Circumferential disc bulge. Severe spinal canal stenosis. Severe bilateral neural foraminal stenosis. L5-S1: Ligamentum flavum hypertrophy. Fracture fragments project into the posterior aspect of the spinal canal at this level. Moderate disc space loss. Severe bilateral facet degenerative changes. Circumferential disc bulge.  Severe spinal canal stenosis. Severe bilateral neural foraminal stenosis. IMPRESSION: 1.  No evidence of acute osseous trauma in the lumbar spine. 2. Multilevel severe degenerative changes throughout the lumbar spine with severe spinal canal stenosis from the L1-L5 level and severe bilateral neuroforaminal stenosis from the L3-S1 level. Consider further evaluation with a dedicated lumbar spine MRI for bettera characterization of the degree of spinal canal stenosis, if clinically warranted. 3. Chronic bilateral pars defects at L5, as well as a chronic fracture through the left pedicle. Electronically Signed   By: Marin Roberts M.D.   On: 05/22/2022 16:57   CT ABDOMEN PELVIS WO CONTRAST  Result Date: 05/22/2022 CLINICAL DATA:  86 year old male with abdominal pain EXAM: CT ABDOMEN AND PELVIS WITHOUT CONTRAST TECHNIQUE: Multidetector CT imaging of the abdomen and pelvis was performed following the standard protocol without IV contrast. RADIATION DOSE REDUCTION: This exam was performed according to the departmental dose-optimization program which includes automated exposure control, adjustment of the mA and/or kV according to patient size and/or use of iterative reconstruction technique. COMPARISON:  10/30/2021, 04/09/2020 FINDINGS: Lower chest: No acute finding of the lower chest Hepatobiliary: Low-density cyst of the caudate, 15 mm, most likely benign biliary cyst. Cholelithiasis. No inflammatory changes. Pancreas: Unremarkable Spleen:  Unremarkable Adrenals/Urinary Tract: - Right adrenal gland:  Unremarkable - Left adrenal gland: Unremarkable. - Right kidney: No hydronephrosis, nephrolithiasis, inflammation, or ureteral dilation. Multiple renal cysts/lesions which are not completely characterized on the current CT given the absence of contrast. Largest at the inferior cortex measures 4.3 cm, unchanged. - Left Kidney: No hydronephrosis, nephrolithiasis, inflammation, or ureteral dilation. Renal lesion on the lateral  cortex, incompletely characterized, representing a cyst on prior CT. - Urinary Bladder: Urinary bladder partially distended. Stomach/Bowel: - Stomach: Unremarkable. - Small bowel: Unremarkable - Appendix: Normal. - Colon: Mild to moderate stool burden. No evidence of obstruction. Left-sided diverticular disease. No inflammatory changes. Vascular/Lymphatic: Redemonstration of EVAR. Diameter of the excluded aneurysm sac estimated 4.5 cm on this noncontrast CT, which is essentially unchanged from the prior CT of 10/30/2021. No inflammatory changes. Mild atherosclerosis.  Atherosclerosis of the iliac arteries. No lymphadenopathy. Reproductive: Transverse diameter of the prostate estimated 4.9 cm Other: Generator within the posterior soft tissues of the left gluteal region. Leads terminate in the posterior canal in the lower thoracic region. Musculoskeletal: Degenerative changes of the thoracolumbar spine. Bilateral L5 pars defect. Similar appearance calcified spurring associated with the facets at the L5 level resulting in canal narrowing approximately 50%. Vacuum disc phenomenon present throughout all levels of the lumbar spine. No displaced fracture. IMPRESSION: CT negative for acute finding that would account for abdominal pain. Urinary bladder partially distended with prostatomegaly, potentially representing chronic bladder outlet obstruction. Aortic Atherosclerosis (ICD10-I70.0). Additional ancillary findings as above. Electronically Signed   By: Corrie Mckusick D.O.   On: 05/22/2022 15:17    Scheduled Meds:  enoxaparin (LOVENOX) injection  0.5 mg/kg Subcutaneous QHS   levothyroxine  175 mcg Oral Q0600   linaclotide  290 mcg Oral QAC breakfast   polyethylene glycol  17 g Oral Daily   senna  1 tablet Oral Daily   vancomycin variable dose per unstable renal function (pharmacist dosing)   Does not apply See admin instructions   Continuous Infusions:  sodium chloride 100 mL/hr at 05/24/22 1057     LOS: 2  days    Time spent: 50 mins    Cleatis Fandrich, MD [facility]   If 7PM-7AM, please contact night-coverage

## 2022-05-24 NOTE — Consult Note (Signed)
Pharmacy Antibiotic Note  Anthony [name] is a 86 y.o. male admitted on 05/22/2022 with bacteremia. PMH significant for AFlutter s/p ablation, chronic back pain s/p stent spine stimulator (currently malfunctioning), HTN, HLD, hypothyroidism, obesity, OSA on CPAP. Patient presenting after mechanical fall on 9/25 and found to have AKI with Scr 2.62 in ED with recovery to 2.14 after IV fluid administration (baseline 1.1-1.4). Of note, patient had recent lumbar epidural injection on 9/20 and is being followed by neuro. Overnight on 9/26, patient found to have significant urinary retention which was relieved with I/O cath. Baseline CK 110. Pharmacy has been consulted for Daptomycin dosing.  Plan: Day 2 of antibiotics Per discussion with MD, will switch from Vancomycin to Daptomycin due to unstable renal function. Initiate Daptomycin 950 mg (10 mg/kg AdjBW) Q24H Monitor CK at least weekly while on Daptomycin  Continue to monitor renal function and follow culture results    Temp (24hrs), Avg:98.4 F (36.9 C), Min:97.8 F (36.6 C), Max:99 F (37.2 C)  Recent Labs  Lab 05/22/22 1052 05/22/22 1243 05/22/22 1530 05/23/22 0500 05/24/22 0325 05/24/22 1300  WBC 21.6*  --   --  18.9* 19.1*  --   CREATININE 2.62*  --   --  2.14* 2.25*  --   LATICACIDVEN  --  1.2 2.0*  --   --   --   VANCORANDOM  --   --   --   --   --  8     Estimated Creatinine Clearance: 33 mL/min (A) (by C-G formula based on SCr of 2.25 mg/dL (H)).    Allergies  Allergen Reactions   Questran [Cholestyramine]     Patient not aware of an allergy to this medicine.   Atorvastatin     Muscle pain   Gemfibrozil Other (See Comments)    Muscle pain.   Metformin And Related     Dizziness   Rosuvastatin     Muscle pain    Antimicrobials this admission: 9/25 Ceftriaxone 1g x1 in ED 9/26 Vancomycin 2000 mg x1  9/27 Daptomcyin >>  Dose adjustments this admission: N/A  Microbiology results: 9/25 BCx: 4 out of 4 bottles  growing Staphylococcus capitis (susceptibilities pending) 9/25 UCx: 20k Aerococcus spp.   Thank you for allowing pharmacy to be a part of this patient's care.  Gretel Acre, PharmD PGY1 Pharmacy Resident 05/24/2022 2:38 PM

## 2022-05-24 NOTE — Evaluation (Signed)
Occupational Therapy Evaluation Patient Details Name: JAQUAVIS FELMLEE MRN: [id number] DOB: Jul 31, 1936 Today's Date: 05/24/2022   History of Present Illness This 86 yrs old male with medical history significant of atrial flutter, status post ablation, chronic back pain status post stent spine stimulator which is malfunctioning, hypertension, hyperlipidemia, hypothyroidism, obesity, obstructive sleep apnea on CPAP comes to the emergency room with complaints of lower back pain radiating bilaterally to both legs,  going down along with increased urinary frequency. Patient is found to have Staph aureus bacteremia, back pain is concerning for epidural abscess/early discitis.  Infectious diseases consulted recommended IV antibiotics. Neurosurgery is consulted states no neurosurgical intervention needed, recommended IV steroids and IV antibiotics. If additional work-up is needed could consider MRI versus CT myelogram.   Clinical Impression   Mr. Yeaman presents with generalized weakness, limited endurance, and pain. He has been living alone, attending [employer] to BADLs, sponge bathing, ambulating with RW, 2-3 falls in previous year, and assistance from family for cooking, housework,  shopping. During today's evaluation, pt endorses 10/10 pain, requests assistance for repositioning to try to reduce pain, but is unable to find a position, either in bed or chair, that allows him to feel better. Nurse notified, provides pain meds. Pt very impulsive with transfers, does not follow directions for safe transfers sit<>stand and bed<>chair. Pt requires Min A for rolling in bed, Mod-Max A for OOB transfers and sit<supine. At present is at high risk of falls, requires asst with all ADLs other than self-feeding, is far from baseline level of fxl mobility. Recommend ongoing OT during hospitalization, with DC to SNF to promote return to PLOF.   Recommendations for follow up therapy are one component of a multi-disciplinary  discharge planning process, led by the attending physician.  Recommendations may be updated based on patient status, additional functional criteria and insurance authorization.   Follow Up Recommendations  Skilled nursing-short term rehab (<3 hours/day)    Assistance Recommended at Discharge Frequent or constant Supervision/Assistance  Patient can return home with the following A lot of help with walking and/or transfers;A lot of help with bathing/dressing/bathroom;Assistance with cooking/housework;Assist for transportation;Help with stairs or ramp for entrance    Functional Status Assessment  Patient has had a recent decline in their functional status and demonstrates the ability to make significant improvements in function in a reasonable and predictable amount of time.  Equipment Recommendations  Other (comment) (defer to next venue)    Recommendations for Other Services       Precautions / Restrictions Precautions Precautions: Fall Restrictions Weight Bearing Restrictions: No      Mobility Bed Mobility Overal bed mobility: Needs Assistance Bed Mobility: Rolling, Supine to Sit, Sit to Supine Rolling: Min assist   Supine to sit: Min assist Sit to supine: Mod assist, Max assist   General bed mobility comments: Mod-Max A elevating LE to bed level for sit<supine    Transfers Overall transfer level: Needs assistance Equipment used: Rolling walker (2 wheels) Transfers: Sit to/from Stand, Bed to chair/wheelchair/BSC Sit to Stand: Mod assist Stand pivot transfers: Mod assist         General transfer comment: Pt demonstrates poor safety awareness, does not follow instructions for safe use of RW      Balance Overall balance assessment: Needs assistance Sitting-balance support: Bilateral upper extremity supported Sitting balance-Leahy Scale: Fair Sitting balance - Comments: sits on very edge of bed, poor safety awareness   Standing balance support: No upper extremity  supported Standing balance-Leahy Scale: Poor Standing  balance comment: Pt discards RW mid transfer, uses furniture for support                           ADL either performed or assessed with clinical judgement   ADL Overall ADL's : Needs assistance/impaired                         Toilet Transfer: Moderate assistance;BSC/3in1;Rolling walker (2 wheels)   Toileting- Clothing Manipulation and Hygiene: Moderate assistance               Vision         Perception     Praxis      Pertinent Vitals/Pain Pain Assessment Pain Assessment: 0-10 Pain Descriptors / Indicators: Grimacing, Guarding, Sharp, Shooting Pain Intervention(s): Repositioned, Limited activity within patient's tolerance, Monitored during session, RN gave pain meds during session     Hand Dominance     Extremity/Trunk Assessment Upper Extremity Assessment Upper Extremity Assessment: Generalized weakness   Lower Extremity Assessment Lower Extremity Assessment: Generalized weakness       Communication Communication Communication: No difficulties   Cognition Arousal/Alertness: Awake/alert Behavior During Therapy: WFL for tasks assessed/performed, Agitated Overall Cognitive Status: Within Functional Limits for tasks assessed                                 General Comments: Agitated by high level of pain, pt had difficulty talking about anything other than his pain.     General Comments       Exercises Other Exercises Other Exercises: Educ re: falls prevention, breathing techniques, safe use of DME   Shoulder Instructions      Home Living Family/patient expects to be discharged to:: Private residence Living Arrangements: Alone Available Help at Discharge: Friend(s);Available PRN/intermittently;Family Type of Home: House Home Access: Level entry     Home Layout: One level     Bathroom Shower/Tub: Sponge bathes at baseline   Bathroom Toilet: Standard      Home Equipment: Conservation officer, nature (2 wheels)          Prior Functioning/Environment Prior Level of Function : Needs assist             Mobility Comments: Uses RW. Reports 2 or 3 falls in past year. ADLs Comments: Sponge bathes at baseline. Family does shopping, cooking, cleaning. Pt can drive but reports he rarely leaves his house.        OT Problem List: Decreased strength;Decreased activity tolerance;Decreased range of motion;Decreased knowledge of use of DME or AE;Impaired balance (sitting and/or standing);Pain;Decreased safety awareness;Obesity      OT Treatment/Interventions: Self-care/ADL training;Patient/family education;Therapeutic exercise;Balance training;Energy conservation;Therapeutic activities;DME and/or AE instruction    OT Goals(Current goals can be found in the care plan section) Acute Rehab OT Goals Patient Stated Goal: to not be in such pain OT Goal Formulation: With patient Time For Goal Achievement: 06/07/22 Potential to Achieve Goals: Good ADL Goals Pt Will Perform Upper Body Dressing: with supervision;sitting Pt Will Perform Lower Body Dressing: with mod assist;sitting/lateral leans Pt Will Transfer to Toilet: with min assist;ambulating;regular height toilet (using LRAD)  OT Frequency: Min 2X/week    Co-evaluation              AM-PAC OT "6 Clicks" Daily Activity     Outcome Measure Help from another person eating meals?: None Help from another person taking care  of personal grooming?: A Little Help from another person toileting, which includes using toliet, bedpan, or urinal?: A Lot Help from another person bathing (including washing, rinsing, drying)?: A Lot Help from another person to put on and taking off regular upper body clothing?: A Lot Help from another person to put on and taking off regular lower body clothing?: A Lot 6 Click Score: 15   End of Session Equipment Utilized During Treatment: Rolling walker (2 wheels) Nurse  Communication: Mobility status  Activity Tolerance: Patient limited by pain Patient left: in bed;with call bell/phone within reach;with bed alarm set  OT Visit Diagnosis: Pain;Muscle weakness (generalized) (M62.81);Unsteadiness on feet (R26.81)                Time: 4707-6151 OT Time Calculation (min): 29 min Charges:  OT General Charges $OT Visit: 1 Visit OT Evaluation $OT Eval Moderate Complexity: 1 Mod OT Treatments $Self Care/Home Management : 23-37 mins Josiah Lobo, PhD, MS, OTR/L 05/24/22, 1:02 PM

## 2022-05-24 NOTE — Evaluation (Signed)
Physical Therapy Evaluation Patient Details Name: Anthony [name] MRN: [id number] DOB: 1935/11/12 Today's Date: 84/27/2023  History of Present Illness  This 86 yrs old male with medical history significant of atrial flutter, status post ablation, chronic back pain status post stent spine stimulator which is malfunctioning, hypertension, hyperlipidemia, hypothyroidism, obesity, obstructive sleep apnea on CPAP comes to the emergency room with complaints of lower back pain radiating bilaterally to both legs,  going down along with increased urinary frequency. Patient is found to have Staph aureus bacteremia, back pain is concerning for epidural abscess/early discitis.  Infectious diseases consulted recommended IV antibiotics. Neurosurgery is consulted states no neurosurgical intervention needed, recommended IV steroids and IV antibiotics. If additional work-up is needed could consider MRI versus CT myelogram.  Clinical Impression  Pt received in bed getting straight cath done with nursing. Pt agreeable to PT evaluation. Pt reported of 3/10 pain in supine with HOB elevated. Pt pain increased to 6/10 with movement and standing. Pt reported of feeling better with Spinal Ablation in the past. PT assessment revealed  Pt SLR positive <10 degrees. Pt prefers to remain at 30 degree flexion when supine and unable to achieve upright standing position 2/2 increase in pain with L/S extension and constant numbness. MMT inconclusive 2/2 to pain.  Pt needs max assist for bed mobility 2/2 to pain, transfers with Mod to min assist with increased pain. Pt refused to ambulate 2/2 to pain. Pt is strong but decreased functional abilities 2/2 to pain. PT will continue ot work with pt in acute care and Pt will benefit from SNF after acute care.      Recommendations for follow up therapy are one component of a multi-disciplinary discharge planning process, led by the attending physician.  Recommendations may be updated based on  patient status, additional functional criteria and insurance authorization.  Follow Up Recommendations Skilled nursing-short term rehab (<3 hours/day)      Assistance Recommended at Discharge Intermittent Supervision/Assistance  Patient can return home with the following  A lot of help with walking and/or transfers;A lot of help with bathing/dressing/bathroom;Assistance with cooking/housework;Assistance with feeding;Direct supervision/assist for medications management;Assist for transportation;Help with stairs or ramp for entrance    Equipment Recommendations None recommended by PT  Recommendations for Other Services       Functional Status Assessment Patient has had a recent decline in their functional status and demonstrates the ability to make significant improvements in function in a reasonable and predictable amount of time.     Precautions / Restrictions Precautions Precautions: Fall Restrictions Weight Bearing Restrictions: No      Mobility  Bed Mobility Overal bed mobility: Needs Assistance Bed Mobility: Rolling, Supine to Sit, Sit to Supine     Supine to sit: Max assist, HOB elevated     General bed mobility comments: with pain and slow    Transfers Overall transfer level: Needs assistance Equipment used: Rolling walker (2 wheels) Transfers: Sit to/from Stand, Bed to chair/wheelchair/BSC Sit to Stand: Mod assist Stand pivot transfers: Min assist Step pivot transfers: Min assist       General transfer comment: Unable to achieve upright posutre 2/2 to 7/10 pain with extension    Ambulation/Gait Ambulation/Gait assistance:  (unable)                Stairs            Wheelchair Mobility    Modified Rankin (Stroke Patients Only)       Balance Overall balance assessment: Needs assistance  Sitting-balance support: Bilateral upper extremity supported Sitting balance-Leahy Scale: Good Sitting balance - Comments: sits with forward lean 2/2 to  pain   Standing balance support: Bilateral upper extremity supported, Reliant on assistive device for balance Standing balance-Leahy Scale: Fair Standing balance comment: impulsive to sit on the chair 2/2 to pain                             Pertinent Vitals/Pain Pain Assessment Pain Assessment: 0-10 Pain Score: 5  Pain Location: L/S and BLE Pain Descriptors / Indicators: Constant, Sharp, Shooting, Numbness, Grimacing, Guarding Pain Intervention(s): Limited activity within patient's tolerance, Patient requesting pain meds-RN notified    Home Living Family/patient expects to be discharged to:: Private residence Living Arrangements: Alone;Spouse/significant other (girl friend.) Available Help at Discharge: Friend(s);Available 24 hours/day Type of Home: House Home Access: Level entry       Home Layout: One level Home Equipment: Conservation officer, nature (2 wheels);Shower seat;Rollator (4 wheels)      Prior Function Prior Level of Function : Needs assist             Mobility Comments: Uses RW. Reports 2 or 3 falls in past year. Girl friend helps him with walking at household level. ADLs Comments: Sponge bathes at baseline. Family and girlfriend  does shopping, cooking, cleaning and assists pt with dressing, bathing. Set up help with  meds.    Pt can drive but reports he rarely leaves his house.     Hand Dominance        Extremity/Trunk Assessment   Upper Extremity Assessment Upper Extremity Assessment: Generalized weakness    Lower Extremity Assessment Lower Extremity Assessment: Generalized weakness       Communication   Communication: No difficulties  Cognition Arousal/Alertness: Awake/alert Behavior During Therapy: WFL for tasks assessed/performed Overall Cognitive Status: Within Functional Limits for tasks assessed                                 General Comments: uncomfortable by high level of pain, pt had difficulty keeping eye open due to  pain and discomfort.        General Comments      Exercises Other Exercises Other Exercises: Pt education regarding fall risk, maintaining pain free position.   Assessment/Plan    PT Assessment Patient needs continued PT services  PT Problem List Decreased strength;Decreased activity tolerance;Decreased balance;Decreased mobility;Decreased safety awareness;Pain       PT Treatment Interventions Gait training;Functional mobility training;Therapeutic activities;Therapeutic exercise;Balance training;Neuromuscular re-education;Patient/family education;Manual techniques;Modalities    PT Goals (Current goals can be found in the Care Plan section)  Acute Rehab PT Goals Patient Stated Goal: get stronger and decrease pain. PT Goal Formulation: With patient Time For Goal Achievement: 06/07/22 Potential to Achieve Goals: Fair    Frequency Min 2X/week     Co-evaluation               AM-PAC PT "6 Clicks" Mobility  Outcome Measure Help needed turning from your back to your side while in a flat bed without using bedrails?: A Little Help needed moving from lying on your back to sitting on the side of a flat bed without using bedrails?: A Lot Help needed moving to and from a bed to a chair (including a wheelchair)?: A Little Help needed standing up from a chair using your arms (e.g., wheelchair or bedside chair)?: A Lot  Help needed to walk in hospital room?: Total Help needed climbing 3-5 steps with a railing? : Total 6 Click Score: 12    End of Session Equipment Utilized During Treatment: Gait belt Activity Tolerance: Patient limited by pain Patient left: in chair;with call bell/phone within reach;with chair alarm set Nurse Communication: Mobility status PT Visit Diagnosis: Unsteadiness on feet (R26.81);Other abnormalities of gait and mobility (R26.89);Muscle weakness (generalized) (M62.81);Repeated falls (R29.6);Pain;Difficulty in walking, not elsewhere classified (R26.2) Pain  - part of body: Leg (L/S)    Time: 3383-2919 PT Time Calculation (min) (ACUTE ONLY): 28 min   Charges:   PT Evaluation $PT Eval Moderate Complexity: 1 Mod PT Treatments $Therapeutic Activity: 23-37 mins        Bronte Kropf PT DPT 4:58 PM,05/24/22

## 2022-05-25 DIAGNOSIS — N179 Acute kidney failure, unspecified: Secondary | ICD-10-CM | POA: Diagnosis not present

## 2022-05-25 DIAGNOSIS — R6520 Severe sepsis without septic shock: Secondary | ICD-10-CM | POA: Diagnosis not present

## 2022-05-25 DIAGNOSIS — M5442 Lumbago with sciatica, left side: Secondary | ICD-10-CM | POA: Diagnosis not present

## 2022-05-25 DIAGNOSIS — A419 Sepsis, unspecified organism: Secondary | ICD-10-CM | POA: Diagnosis not present

## 2022-05-25 DIAGNOSIS — R7881 Bacteremia: Secondary | ICD-10-CM | POA: Diagnosis not present

## 2022-05-25 DIAGNOSIS — D72829 Elevated white blood cell count, unspecified: Secondary | ICD-10-CM | POA: Diagnosis not present

## 2022-05-25 LAB — CULTURE, BLOOD (ROUTINE X 2)
Special Requests: ADEQUATE
Special Requests: ADEQUATE

## 2022-05-25 LAB — CBC
HCT: 31.8 % — ABNORMAL LOW (ref 39.0–52.0)
Hemoglobin: 10.7 g/dL — ABNORMAL LOW (ref 13.0–17.0)
MCH: 30.7 pg (ref 26.0–34.0)
MCHC: 33.6 g/dL (ref 30.0–36.0)
MCV: 91.4 fL (ref 80.0–100.0)
Platelets: 267 10*3/uL (ref 150–400)
RBC: 3.48 MIL/uL — ABNORMAL LOW (ref 4.22–5.81)
RDW: 14.4 % (ref 11.5–15.5)
WBC: 19.9 10*3/uL — ABNORMAL HIGH (ref 4.0–10.5)
nRBC: 0 % (ref 0.0–0.2)

## 2022-05-25 LAB — BASIC METABOLIC PANEL
Anion gap: 7 (ref 5–15)
BUN: 43 mg/dL — ABNORMAL HIGH (ref 8–23)
CO2: 23 mmol/L (ref 22–32)
Calcium: 9.9 mg/dL (ref 8.9–10.3)
Chloride: 111 mmol/L (ref 98–111)
Creatinine, Ser: 1.42 mg/dL — ABNORMAL HIGH (ref 0.61–1.24)
GFR, Estimated: 49 mL/min — ABNORMAL LOW (ref >60)
Glucose, Bld: 171 mg/dL — ABNORMAL HIGH (ref 70–99)
Potassium: 3.5 mmol/L (ref 3.5–5.1)
Sodium: 141 mmol/L (ref 135–145)

## 2022-05-25 LAB — MAGNESIUM: Magnesium: 2 mg/dL (ref 1.7–2.4)

## 2022-05-25 LAB — PHOSPHORUS: Phosphorus: 2.9 mg/dL (ref 2.5–4.6)

## 2022-05-25 MED ORDER — SODIUM CHLORIDE 0.9 % IV SOLN
2.0000 g | Freq: Once | INTRAVENOUS | Status: AC
  Administered 2022-05-25: 2 g via INTRAVENOUS
  Filled 2022-05-25: qty 8

## 2022-05-25 MED ORDER — SODIUM CHLORIDE 0.9 % IV SOLN
12.0000 g | INTRAVENOUS | Status: DC
  Administered 2022-05-25 – 2022-05-28 (×4): 12 g via INTRAVENOUS
  Filled 2022-05-25 (×4): qty 48

## 2022-05-25 MED ORDER — ASPIRIN 81 MG PO TBEC
81.0000 mg | DELAYED_RELEASE_TABLET | Freq: Every day | ORAL | Status: DC
  Administered 2022-05-25 – 2022-05-29 (×5): 81 mg via ORAL
  Filled 2022-05-25 (×5): qty 1

## 2022-05-25 NOTE — Consult Note (Signed)
Pharmacy Antibiotic Note  Anthony [name] is a 86 y.o. male admitted on 05/22/2022 with bacteremia. PMH significant for AFlutter s/p ablation, chronic back pain s/p stent spine stimulator (currently malfunctioning), HTN, HLD, hypothyroidism, obesity, OSA on CPAP. Patient presenting after mechanical fall on 9/25 and found to have AKI with Scr 2.62 in ED with recovery to 2.14 after IV fluid administration (baseline 1.1-1.4). Of note, patient had recent lumbar epidural injection on 9/20 and is being followed by neuro. Overnight on 9/26, patient found to have significant urinary retention which was relieved with I/O cath.   Plan: Day 3 of antibiotics Per discussion with MD, will switch from Daptomycin ro Nafcillin per culture sensitivities. Give Nafcillin 2g IV x1 followed by 12g IV Q24H as continuous infusion Pharmacy will sign off and continue to monitor peripherally    Temp (24hrs), Avg:98.7 F (37.1 C), Min:98.2 F (36.8 C), Max:99.1 F (37.3 C)  Recent Labs  Lab 05/22/22 1052 05/22/22 1243 05/22/22 1530 05/23/22 0500 05/24/22 0325 05/24/22 1300 05/25/22 0545  WBC 21.6*  --   --  18.9* 19.1*  --  19.9*  CREATININE 2.62*  --   --  2.14* 2.25*  --  1.42*  LATICACIDVEN  --  1.2 2.0*  --   --   --   --   VANCORANDOM  --   --   --   --   --  8  --     Estimated Creatinine Clearance: 52.4 mL/min (A) (by C-G formula based on SCr of 1.42 mg/dL (H)).    Allergies  Allergen Reactions   Questran [Cholestyramine]     Patient not aware of an allergy to this medicine.   Atorvastatin     Muscle pain   Gemfibrozil Other (See Comments)    Muscle pain.   Metformin And Related     Dizziness   Rosuvastatin     Muscle pain    Antimicrobials this admission: 9/25 Ceftriaxone 1g x1 in ED 9/26 Vancomycin 2000 mg x1  9/27 Daptomcyin 950 mg x1 9/28 Nafcillin >>  Dose adjustments this admission: N/A  Microbiology results: 9/25 BCx: 4 out of 4 bottles growing Staphylococcus capitis  (pansensitive) 9/25 UCx: 20k Aerococcus spp.   Thank you for allowing pharmacy to be a part of this patient's care.  Gretel Acre, PharmD PGY1 Pharmacy Resident 05/25/2022 4:14 PM

## 2022-05-25 NOTE — Care Management Important Message (Signed)
Important Message  Patient Details  Name: Anthony [name] MRN: [id number] Date of Birth: 04-Jun-1936   Medicare Important Message Given:  Yes     Dannette Barbara 05/25/2022, 1:39 PM

## 2022-05-25 NOTE — NC FL2 (Signed)
[place] LEVEL OF CARE SCREENING TOOL     IDENTIFICATION  Patient Name: Anthony [name] Birthdate: [birth year] Sex: male Admission Date (Current Location): 05/22/2022  [facility] and [place] Number:  Engineering geologist and Address:  [facility], [address] [place], [place] [address]      Provider Number: [id number]  Attending Physician Name and Address:  Shawna Clamp, MD  Relative Name and Phone Number:  Efren Kross [phone]    Current Level of Care: Hospital Recommended Level of Care: [place] Prior Approval Number:    Date Approved/Denied:   PASRR Number: [id number]  Discharge Plan: SNF    Current Diagnoses: Patient Active Problem List   Diagnosis Date Noted   Coag negative Staphylococcus bacteremia    Sepsis ([place]) 05/22/2022   Acute kidney injury ([place])    Acute midline low back pain with bilateral sciatica    Leukocytosis    Lumbar radiculopathy 05/08/2022   Lumbar spondylosis 01/31/2022   Lumbar degenerative disc disease 01/31/2022   Chronic pain syndrome 01/31/2022   Medicare annual wellness visit, subsequent 12/29/2021   Prediabetes 12/29/2021   Fatigue 12/31/2020   S/P endovascular aneurysm repair 04/2020   Eosinophilia 04/11/2020   Varicose veins of leg with pain, bilateral 04/06/2020   General unsteadiness 02/07/2020   Encounter for chronic pain management 07/04/2019   Essential tremor 05/07/2019   Cystic kidney disease 04/15/2019   Osteoarthritis 04/15/2019   Seasonal allergies 04/15/2019   Anosmia 02/17/2019   Advanced care planning/counseling discussion 11/05/2018   Vitamin D deficiency 10/24/2018   S/P insertion of spinal cord stimulator 09/09/2018   CKD (chronic kidney disease) stage 3, GFR 30-59 ml/min (HCC) 10/18/2017   Therapeutic opioid-induced constipation (OIC) 10/03/2017   Nasal congestion 06/26/2017   Chronic insomnia    Essential hypertension    Hypothyroidism     Severe obesity (BMI 35.0-39.9) with comorbidity ([place])    CAD (coronary artery disease)    Chronic lower back pain    History of atrial flutter 04/15/2017   History of basal cell carcinoma (BCC) 04/12/2017   History of squamous cell carcinoma in situ of skin 04/12/2017   Hypercholesterolemia with hypertriglyceridemia 04/12/2017   Chronic constipation 04/12/2017   OSA on CPAP 04/12/2017   BPH (benign prostatic hyperplasia) 35/70/1779   Diastolic heart failure ([place]) 02/18/2017   Abdominal aortic aneurysm (AAA) without rupture ([place]) 01/09/2016   Calculus of gallbladder without cholecystitis 01/09/2016   Diverticulitis of sigmoid colon 01/09/2016   Spondylolisthesis of lumbar region 01/09/2016   Recurrent major depression in remission ([place]) 03/28/2013   CAD in native artery 07/11/2012   Esophageal reflux 09/21/2011   Left leg pain 09/21/2011   Spinal stenosis 04/20/2011    Orientation RESPIRATION BLADDER Height & Weight     Self, Place  Normal Incontinent, External catheter Weight:   Height:     BEHAVIORAL SYMPTOMS/MOOD NEUROLOGICAL BOWEL NUTRITION STATUS      Incontinent Diet (See DC summary)  AMBULATORY STATUS COMMUNICATION OF NEEDS Skin   Extensive Assist Verbally Normal                       Personal Care Assistance Level of Assistance  Bathing, Feeding, Dressing Bathing Assistance: Maximum assistance Feeding assistance: Limited assistance Dressing Assistance: Maximum assistance     Functional Limitations Info  Sight, Hearing, Speech Sight Info: Adequate Hearing Info: Adequate Speech Info: Adequate    SPECIAL CARE FACTORS FREQUENCY  PT (By licensed PT),  OT (By licensed OT)     PT Frequency: 5x week OT Frequency: 5x week            Contractures Contractures Info: Not present    Additional Factors Info  Code Status, Allergies Code Status Info: DNR Allergies Info: Questran (Cholestyramine)   Atorvastatin   Gemfibrozil   Metformin And Related    Rosuvastatin           Current Medications (05/25/2022):  This is the current hospital active medication list Current Facility-Administered Medications  Medication Dose Route Frequency Provider Last Rate Last Admin   acetaminophen (TYLENOL) tablet 650 mg  650 mg Oral Q6H PRN Fritzi Mandes, MD   650 mg at 05/23/22 0327   Or   acetaminophen (TYLENOL) suppository 650 mg  650 mg Rectal Q6H PRN Fritzi Mandes, MD       amLODipine (NORVASC) tablet 5 mg  5 mg Oral Daily Shawna Clamp, MD   5 mg at 05/25/22 0914   DAPTOmycin (CUBICIN) 950 mg in sodium chloride 0.9 % IVPB  10 mg/kg (Adjusted) Intravenous Q2000 Tsosie Billing, MD 138 mL/hr at 05/24/22 1703 950 mg at 05/24/22 1703   enoxaparin (LOVENOX) injection 62.5 mg  0.5 mg/kg Subcutaneous QHS Fritzi Mandes, MD   62.5 mg at 05/24/22 2153   ezetimibe (ZETIA) tablet 10 mg  10 mg Oral Daily Shawna Clamp, MD   10 mg at 05/24/22 2153   gabapentin (NEURONTIN) capsule 600 mg  600 mg Oral QHS Shawna Clamp, MD   600 mg at 05/24/22 2153   levothyroxine (SYNTHROID) tablet 175 mcg  175 mcg Oral Q0600 Fritzi Mandes, MD   175 mcg at 05/25/22 0539   linaclotide (LINZESS) capsule 290 mcg  290 mcg Oral QAC breakfast Fritzi Mandes, MD   290 mcg at 05/25/22 0914   morphine (PF) 2 MG/ML injection 2 mg  2 mg Intravenous Q4H PRN Fritzi Mandes, MD   2 mg at 05/23/22 1824   ondansetron (ZOFRAN) tablet 4 mg  4 mg Oral Q6H PRN Fritzi Mandes, MD       Or   ondansetron [facility]) injection 4 mg  4 mg Intravenous Q6H PRN Fritzi Mandes, MD       oxyCODONE (Oxy IR/ROXICODONE) immediate release tablet 5 mg  5 mg Oral Q4H PRN Fritzi Mandes, MD   5 mg at 05/25/22 0914   polyethylene glycol (MIRALAX / GLYCOLAX) packet 17 g  17 g Oral Daily Shawna Clamp, MD   17 g at 05/25/22 0914   senna (SENOKOT) tablet 8.6 mg  1 tablet Oral Daily Shawna Clamp, MD   8.6 mg at 05/25/22 0914   tamsulosin (FLOMAX) capsule 0.4 mg  0.4 mg Oral Daily Shawna Clamp, MD   0.4 mg at 05/25/22 0914   traZODone  (DESYREL) tablet 25 mg  25 mg Oral QHS PRN Fritzi Mandes, MD         Discharge Medications: Please see discharge summary for a list of discharge medications.  Relevant Imaging Results:  Relevant Lab Results:   Additional Information SS# [place], [place]

## 2022-05-25 NOTE — Progress Notes (Signed)
   Date of Admission:  05/22/2022     ID: Anthony [name] is a 86 y.o. male  Principal Problem:   Sepsis ([place]) Active Problems:   Coag negative Staphylococcus bacteremia    Subjective: Still has back pain Frequent stool because he is on three different meds for constipation ( mirlax, senna and linzess) Some confusion  Medications:   amLODipine  5 mg Oral Daily   aspirin EC  81 mg Oral Daily   enoxaparin (LOVENOX) injection  0.5 mg/kg Subcutaneous QHS   ezetimibe  10 mg Oral Daily   gabapentin  600 mg Oral QHS   levothyroxine  175 mcg Oral Q0600   linaclotide  290 mcg Oral QAC breakfast   polyethylene glycol  17 g Oral Daily   senna  1 tablet Oral Daily   tamsulosin  0.4 mg Oral Daily    Objective: Vital signs in last 24 hours: Temp:  [98.2 F (36.8 C)-99.1 F (37.3 C)] 99.1 F (37.3 C) (09/28 0931) Pulse Rate:  [93-103] 93 (09/28 0931) Resp:  [17-20] 17 (09/28 0931) BP: (146-174)/(92-122) 158/95 (09/28 0931) SpO2:  [92 %-95 %] 95 % (09/28 0931)    PHYSICAL EXAM:  General: goes to sleep easily Some confusion intermittently Lungs: Clear to auscultation bilaterally. No Wheezing or Rhonchi. No rales. Heart: Regular rate and rhythm, no murmur, rub or gallop. Abdomen: Soft, non-tender,not distended. Bowel sounds normal. No masses Extremities: atraumatic, no cyanosis. No edema. No clubbing Skin: No rashes or lesions. Or bruising Lymph: Cervical, supraclavicular normal. Neurologic: left lower extremity weakness  Lab Results Recent Labs    05/24/22 0325 05/25/22 0545  WBC 19.1* 19.9*  HGB 9.8* 10.7*  HCT 29.3* 31.8*  NA 139 141  K 3.8 3.5  CL 110 111  CO2 24 23  BUN 55* 43*  CREATININE 2.25* 1.42*   Liver Panel No results for input(s): "PROT", "ALBUMIN", "AST", "ALT", "ALKPHOS", "BILITOT", "BILIDIR", "IBILI" in the last 72 hours. Sedimentation Rate Recent Labs    05/22/22 1530  ESRSEDRATE 10   C-Reactive Protein Recent Labs    05/22/22 1530  CRP  42.2*    Microbiology: Staph capitis bacteremia 4/4    Assessment/Plan: Low back pain with radiation down both legs Left leg more weak than right Has underlying degenerative spine disease and after a recent LESI the pain is worse Leucocytosis Staph bacteremia Concern for early discitis/epidural abscess- because the CT scan was done without contrast he needs better imaging   repeat blood culture Will do 2 d echo On dapto as staph capitis susceptible to oxacillin change to nafcillin Pt has spine stimulator and that prevents from getting MRI Will get CT with contrast once kidney function improves   AKI on CKD- combination of infection, NSAID use, dehydration and retention post void bladder scan had 900cc of urine and staright cath was 1600cc Has foley cath   Hypothyrodism on synthorid   CAD s/p stent B/l TKA   Discussed the management with patient and care team Spoke to Anthony [name] his son in detail

## 2022-05-25 NOTE — Progress Notes (Signed)
PROGRESS NOTE    Anthony [name]  YKD:[id number] DOB: 04-11-1936 DOA: 05/22/2022 PCP: Ria Bush, MD   Brief Narrative: This 86 yrs old male with medical history significant of atrial flutter,  status post ablation, chronic back pain status post stent spine stimulator which is malfunctioning, hypertension, hyperlipidemia, hypothyroidism, obesity, obstructive sleep apnea on CPAP comes to the emergency room with complaints of lower back pain radiating bilaterally to both legs,  going down along with increased urinary frequency.  Patient is found to have Staph aureus bacteremia, back pain is concerning for epidural abscess/early discitis.  Infectious diseases consulted recommended IV antibiotics.  Neurosurgery is consulted states no neurosurgical intervention needed, recommended IV steroids and IV antibiotics.  If additional work-up is needed could consider MRI versus CT myelogram.  Assessment & Plan:   Principal Problem:   Sepsis ([place]) Active Problems:   Coag negative Staphylococcus bacteremia  Severe sepsis POA:  Staph bacteremia It could be UTI, back pain with possible discitis given recent procedure. He was found to have tachycardia , elevated white count of 21K, lactic acid of 2.0, increased creatinine to 2.6 from baseline 1.3. Continue IV fluids as per sepsis protocol. Lactic acid normalized, 1.2> 2.0 Infectious disease Dr. Delaine Lame consulted.  Antibiotics changed to daptomycin. Urine culture 20 K of gram-positive cocci. Repeat blood cultures, obtain 2D echo.   Chronic back pain with severe degenerative arthritis: Chronic pain /history of spinal cord pain stimulator-- malfunctioning. Patient follows with pain clinic Dr. Zollie Scale as an outpatient, He recently underwent lumbar spinal steroid injection on 20 September 23 CRP 42.2, ESR 10.0 Neurosurgery consultation with Dr. Cari Caraway completed, given increase in weakness more on the left and right-- recommends brace and  follow-up outpatient for malfunctioning pain stimulator. Unable to do MRI given stimulator Continue PRN PO and IV pain meds Continue muscle relaxants-- patient is on gabapentin.   AKI on CKD stage IIIb: > Improving. Baseline creatinine 1.3 Presented  with creatinine of 2.62--2.18>2.25>1.42 Continue IV fluids, monitor input output, monitor creatinine with metabolic panel. Avoid nephrotoxic agents Serum creatinine improving   Leukocytosis: Likely reactive due to steroids versus to infection. --WBC 21K-18K-19.1 Patient remains on daptomycin for staph bacteremia.   Elevated troponin: Likely demand ischemia in the setting of sepsis: Patient denies any chest pain He has hx.  of CAD with stent placed in LAD few years ago He has hx. of atrial flutter status post ambulation in the past. Patient follows with [facility] cardiology Continue aspirin Plavix and statins.   Hypothyroidism Continue Synthroid.   Hyperlipidemia  Continue Zetia.    DVT prophylaxis: Lovenox Code Status: DNR Family Communication: No family at bedside Disposition Plan:   Status is: Inpatient Remains inpatient appropriate because: Admitted for worsening lower back pain radiating towards the lower extremities.  Blood cultures positive for Staph aureus , ID and Neurosurgery consulted started on IV daptomycin.  PT recommended SNF.  Patient is not medically clear.   Consultants:  Neurosurgery Infectious diseases  Procedures: None Antimicrobials: Daptomycin  Subjective: Patient was seen and examined at bedside.  Overnight events noted. Patient continued to complain about having severe lower back pain. Stated when he tries to get out of the bed, the pain gets severe.   Objective: Vitals:   05/24/22 1702 05/24/22 2121 05/25/22 0553 05/25/22 0931  BP: (!) 165/92 (!) 146/97 (!) 174/122 (!) 158/95  Pulse: (!) 103 100 98 93  Resp: _0 Temp: 98.8 F (37.1 C) 98.6 F (37 C) 98.2 F (36.8  C) 99.1 F (37.3  C)  TempSrc:      SpO2: 92% 93% 94% 95%    Intake/Output Summary (Last 24 hours) at 05/25/2022 1240 Last data filed at 05/25/2022 0534 Gross per 24 hour  Intake 69 ml  Output 2200 ml  Net -2131 ml   There were no vitals filed for this visit.  Examination:  General exam: Appears comfortable, NAD , deconditioned, morbidly obese. Respiratory system: CTA bilaterally, respiratory effort normal, RR 15 Cardiovascular system: S1 & S2 heard, regular rate and rhythm, no murmur.   Gastrointestinal system: Abdomen is soft, non tender, non distended, BS+ Central nervous system: Alert and oriented X 3. No focal neurological deficits. Back: Tenderness noted in the lower back.  Restricted movements Extremities: No edema, no cyanosis, no clubbing. Skin: No rashes, lesions or ulcers Psychiatry: Judgement and insight appear normal. Mood & affect appropriate.     Data Reviewed: I have personally reviewed following labs and imaging studies  CBC: Recent Labs  Lab 05/22/22 1052 05/23/22 0500 05/24/22 0325 05/25/22 0545  WBC 21.6* 18.9* 19.1* 19.9*  HGB 11.8* 10.5* 9.8* 10.7*  HCT 36.2* 32.2* 29.3* 31.8*  MCV 95.3 95.8 93.9 91.4  PLT 263 221 208 568   Basic Metabolic Panel: Recent Labs  Lab 05/22/22 1052 05/23/22 0500 05/24/22 0325 05/25/22 0545  NA 141 137 139 141  K 3.9 3.4* 3.8 3.5  CL 105 108 110 111  CO2 _0 GLUCOSE 156* 152* 163* 171*  BUN 67* 56* 55* 43*  CREATININE 2.62* 2.14* 2.25* 1.42*  CALCIUM 10.2 9.3 9.5 9.9  MG  --   --   --  2.0  PHOS  --   --   --  2.9   GFR: Estimated Creatinine Clearance: 52.4 mL/min (A) (by C-G formula based on SCr of 1.42 mg/dL (H)). Liver Function Tests: No results for input(s): "AST", "ALT", "ALKPHOS", "BILITOT", "PROT", "ALBUMIN" in the last 168 hours. No results for input(s): "LIPASE", "AMYLASE" in the last 168 hours. No results for input(s): "AMMONIA" in the last 168 hours. Coagulation Profile: No results for input(s):  "INR", "PROTIME" in the last 168 hours. Cardiac Enzymes: Recent Labs  Lab 05/24/22 1501  CKTOTAL 110   BNP (last 3 results) No results for input(s): "PROBNP" in the last 8760 hours. HbA1C: No results for input(s): "HGBA1C" in the last 72 hours. CBG: No results for input(s): "GLUCAP" in the last 168 hours. Lipid Profile: No results for input(s): "CHOL", "HDL", "LDLCALC", "TRIG", "CHOLHDL", "LDLDIRECT" in the last 72 hours. Thyroid Function Tests: No results for input(s): "TSH", "T4TOTAL", "FREET4", "T3FREE", "THYROIDAB" in the last 72 hours. Anemia Panel: No results for input(s): "VITAMINB12", "FOLATE", "FERRITIN", "TIBC", "IRON", "RETICCTPCT" in the last 72 hours. Sepsis Labs: Recent Labs  Lab 05/22/22 1243 05/22/22 1530  LATICACIDVEN 1.2 2.0*    Recent Results (from the past 240 hour(s))  Blood culture (routine x 2)     Status: Abnormal   Collection Time: 05/22/22 12:43 PM   Specimen: Right Antecubital; Blood  Result Value Ref Range Status   Specimen Description   Final    RIGHT ANTECUBITAL Performed at [facility], [address] [place], [place] [address]    Special Requests   Final    BOTTLES DRAWN AEROBIC AND ANAEROBIC Blood Culture adequate volume Performed at [facility], [address] [place], [place] [address]    Culture  Setup Time   Final    GRAM POSITIVE COCCI IN BOTH  AEROBIC AND ANAEROBIC BOTTLES CRITICAL RESULT CALLED TO, READ BACK BY AND VERIFIED WITH: Anthony [name] 05/23/22 9528 MW Performed at [facility], [place] [address], [place], [place] [address]    Culture STAPHYLOCOCCUS CAPITIS (A)  Final   Report Status 05/25/2022 FINAL  Final   Organism ID, Bacteria STAPHYLOCOCCUS CAPITIS  Final      Susceptibility   Staphylococcus capitis - MIC*    CIPROFLOXACIN <=0.5 SENSITIVE Sensitive     ERYTHROMYCIN <=0.25 SENSITIVE Sensitive     GENTAMICIN <=0.5 SENSITIVE Sensitive     OXACILLIN <=0.25 SENSITIVE Sensitive      TETRACYCLINE <=1 SENSITIVE Sensitive     VANCOMYCIN 1 SENSITIVE Sensitive     TRIMETH/SULFA <=10 SENSITIVE Sensitive     CLINDAMYCIN <=0.25 SENSITIVE Sensitive     RIFAMPIN <=0.5 SENSITIVE Sensitive     Inducible Clindamycin NEGATIVE Sensitive     * STAPHYLOCOCCUS CAPITIS  Blood Culture ID Panel (Reflexed)     Status: Abnormal   Collection Time: 05/22/22 12:43 PM  Result Value Ref Range Status   Enterococcus faecalis NOT DETECTED NOT DETECTED Final   Enterococcus Faecium NOT DETECTED NOT DETECTED Final   Listeria monocytogenes NOT DETECTED NOT DETECTED Final   Staphylococcus species DETECTED (A) NOT DETECTED Final    Comment: CRITICAL RESULT CALLED TO, READ BACK BY AND VERIFIED WITH: Anthony [name] 05/23/22 0906 MW    Staphylococcus aureus (BCID) NOT DETECTED NOT DETECTED Final   Staphylococcus epidermidis NOT DETECTED NOT DETECTED Final   Staphylococcus lugdunensis NOT DETECTED NOT DETECTED Final   Streptococcus species NOT DETECTED NOT DETECTED Final   Streptococcus agalactiae NOT DETECTED NOT DETECTED Final   Streptococcus pneumoniae NOT DETECTED NOT DETECTED Final   Streptococcus pyogenes NOT DETECTED NOT DETECTED Final   A.calcoaceticus-baumannii NOT DETECTED NOT DETECTED Final   Bacteroides fragilis NOT DETECTED NOT DETECTED Final   Enterobacterales NOT DETECTED NOT DETECTED Final   Enterobacter cloacae complex NOT DETECTED NOT DETECTED Final   Escherichia coli NOT DETECTED NOT DETECTED Final   Klebsiella aerogenes NOT DETECTED NOT DETECTED Final   Klebsiella oxytoca NOT DETECTED NOT DETECTED Final   Klebsiella pneumoniae NOT DETECTED NOT DETECTED Final   Proteus species NOT DETECTED NOT DETECTED Final   Salmonella species NOT DETECTED NOT DETECTED Final   Serratia marcescens NOT DETECTED NOT DETECTED Final   Haemophilus influenzae NOT DETECTED NOT DETECTED Final   Neisseria meningitidis NOT DETECTED NOT DETECTED Final   Pseudomonas aeruginosa NOT DETECTED NOT DETECTED Final    Stenotrophomonas maltophilia NOT DETECTED NOT DETECTED Final   Candida albicans NOT DETECTED NOT DETECTED Final   Candida auris NOT DETECTED NOT DETECTED Final   Candida glabrata NOT DETECTED NOT DETECTED Final   Candida krusei NOT DETECTED NOT DETECTED Final   Candida parapsilosis NOT DETECTED NOT DETECTED Final   Candida tropicalis NOT DETECTED NOT DETECTED Final   Cryptococcus neoformans/gattii NOT DETECTED NOT DETECTED Final    Comment: Performed at [facility], [address] [place], [place] [address]  Blood culture (routine x 2)     Status: Abnormal   Collection Time: 05/22/22 12:48 PM   Specimen: Left Antecubital; Blood  Result Value Ref Range Status   Specimen Description   Final    LEFT ANTECUBITAL Performed at [facility], [address] [place], [place] [address]    Special Requests   Final    BOTTLES DRAWN AEROBIC AND ANAEROBIC Blood Culture adequate volume Performed at [facility], [place],  [place], [place] [address]    Culture  Setup Time   Final    GRAM POSITIVE COCCI IN BOTH AEROBIC AND ANAEROBIC BOTTLES CRITICAL RESULT CALLED TO, READ BACK BY AND VERIFIED WITH: Anthony [name] 05/23/22 9833 MW Performed at [facility], [address] [place], [place] [address]    Culture (A)  Final    STAPHYLOCOCCUS CAPITIS SUSCEPTIBILITIES PERFORMED ON PREVIOUS CULTURE WITHIN THE LAST 5 DAYS. Performed at [facility], [place] [address] [place], [place] [address]    Report Status 05/25/2022 FINAL  Final  Urine Culture     Status: Abnormal   Collection Time: 05/22/22  3:28 PM   Specimen: Urine, Clean Catch  Result Value Ref Range Status   Specimen Description   Final    URINE, CLEAN CATCH Performed at [facility], [address] [place], [place] [address]    Special Requests   Final    NONE Performed at [facility], [address] [place], [place] [address]    Culture (A)  Final    20,000  COLONIES/mL AEROCOCCUS SPECIES Standardized susceptibility testing for this organism is not available. Performed at [facility], [place] [address], [place], [place] [address]    Report Status 05/24/2022 FINAL  Final    Radiology Studies: No results found.  Scheduled Meds:  amLODipine  5 mg Oral Daily   aspirin EC  81 mg Oral Daily   enoxaparin (LOVENOX) injection  0.5 mg/kg Subcutaneous QHS   ezetimibe  10 mg Oral Daily   gabapentin  600 mg Oral QHS   levothyroxine  175 mcg Oral Q0600   linaclotide  290 mcg Oral QAC breakfast   polyethylene glycol  17 g Oral Daily   senna  1 tablet Oral Daily   tamsulosin  0.4 mg Oral Daily   Continuous Infusions:  DAPTOmycin (CUBICIN) 950 mg in sodium chloride 0.9 % IVPB 950 mg (05/24/22 1703)     LOS: 3 days    Time spent: 35 mins    Shanielle Correll, MD [facility]   If 7PM-7AM, please contact night-coverage

## 2022-05-25 NOTE — Progress Notes (Signed)
Physical Therapy Treatment Patient Details Name: Anthony [name] MRN: [id number] DOB: March 10, 1936 Today's Date: 05/25/2022   History of Present Illness This 86 yrs old male with medical history significant of atrial flutter, status post ablation, chronic back pain status post stent spine stimulator which is malfunctioning, hypertension, hyperlipidemia, hypothyroidism, obesity, obstructive sleep apnea on CPAP comes to the emergency room with complaints of lower back pain radiating bilaterally to both legs,  going down along with increased urinary frequency. Patient is found to have Staph aureus bacteremia, back pain is concerning for epidural abscess/early discitis.  Infectious diseases consulted recommended IV antibiotics. Neurosurgery is consulted states no neurosurgical intervention needed, recommended IV steroids and IV antibiotics. If additional work-up is needed could consider MRI versus CT myelogram.    PT Comments    Pt received in bed agreeable to participate in skilled PT interventions. Nurse reported of pt demonstrates increased cognitive decline/ increased confusion suspected 2/2 medication. PT also found the same as pt presented himself with increased lethargy and difficulty keeping eyes ope. Pt followed simple commands. Pt performed Bed mobility with min ot max assist. Pt remains limited 2/2 to pain between 5/10 at rest to 8/10 with movement especially extension. Pt performed STS with Min assist of 1 with RW and stood for 12 secs with upright posture with impulsive and uncontrolled back to sitting due ot pain.  PT provided gentle flexion exs including Ankle pumps, SKC, glute sets and heel slides  10 reps in supine. Pt reported ot decrease in pain with these exs. Pt advised to them in bed every hour 3 rep each. Pt able to recollect heel slides only. Pt deferred form getting OOB 2/2 pt required 4 nurses to get him back to bed and pt is restless 2/2 pain which increases fal risk. Pt pulled himself  up in the bed using hand rails and bed position with pain 10/10. Pt will continue to benefit form PT intervention in acute and SNF after acute care.   Recommendations for follow up therapy are one component of a multi-disciplinary discharge planning process, led by the attending physician.  Recommendations may be updated based on patient status, additional functional criteria and insurance authorization.  Follow Up Recommendations  Skilled nursing-short term rehab (<3 hours/day)     Assistance Recommended at Discharge Intermittent Supervision/Assistance  Patient can return home with the following A lot of help with walking and/or transfers;A lot of help with bathing/dressing/bathroom;Assistance with cooking/housework;Assistance with feeding;Direct supervision/assist for medications management;Assist for transportation;Help with stairs or ramp for entrance   Equipment Recommendations  None recommended by PT    Recommendations for Other Services       Precautions / Restrictions Precautions Precautions: Fall Restrictions Weight Bearing Restrictions: No     Mobility  Bed Mobility Overal bed mobility: Needs Assistance Bed Mobility: Supine to Sit, Sit to Supine Rolling: Min assist   Supine to sit: Min assist (to BLE) Sit to supine: Max assist, HOB elevated (to BLE)   General bed mobility comments: Pt became comfortable in bed with set up help.    Transfers Overall transfer level: Needs assistance Equipment used: Rolling walker (2 wheels) Transfers: Sit to/from Stand Sit to Stand: Min assist           General transfer comment: Pt stood fro 12 secs eith upright posutre and sat with poor control    Ambulation/Gait Ambulation/Gait assistance:  (unable due to pain.)  Stairs: N/A             Wheelchair Mobility    Modified Rankin (Stroke Patients Only)       Balance Overall balance assessment: Needs assistance Sitting-balance support:  Feet supported Sitting balance-Leahy Scale: Good Sitting balance - Comments: sits with forward lean 2/2 to pain   Standing balance support: Bilateral upper extremity supported, Reliant on assistive device for balance Standing balance-Leahy Scale: Fair Standing balance comment: impulsive to sat down  on the bed 2/2 to pain                            Cognition Arousal/Alertness: Suspect due to medications, Lethargic Behavior During Therapy: WFL for tasks assessed/performed Overall Cognitive Status: Impaired/Different from baseline Area of Impairment: Memory, Attention, Problem solving, Following commands                               General Comments: uncomfortable by high level of pain, pt had difficulty keeping eye open due to pain and discomfort.        Exercises General Exercises - Lower Extremity Ankle Circles/Pumps: AROM, 10 reps, Supine Heel Slides: AROM, 10 reps, Supine Other Exercises Other Exercises: pt education to perform heel slides every hour 3 to 5 reps. Single knee to chest 1 x 30 secs hold both side.    General Comments        Pertinent Vitals/Pain Pain Assessment Pain Assessment: 0-10 Pain Score: 5  Pain Location: L/S and BLE Pain Descriptors / Indicators: Constant, Sharp, Shooting, Numbness, Grimacing, Guarding Pain Intervention(s): Limited activity within patient's tolerance, Premedicated before session    Home Living                          Prior Function            PT Goals (current goals can now be found in the care plan section) Acute Rehab PT Goals PT Goal Formulation: With patient Time For Goal Achievement: 06/07/22 Potential to Achieve Goals: Fair Progress towards PT goals: Progressing toward goals    Frequency    Min 2X/week      PT Plan Current plan remains appropriate    Co-evaluation              AM-PAC PT "6 Clicks" Mobility   Outcome Measure  Help needed turning from your back  to your side while in a flat bed without using bedrails?: A Little Help needed moving from lying on your back to sitting on the side of a flat bed without using bedrails?: A Lot Help needed moving to and from a bed to a chair (including a wheelchair)?: A Little Help needed standing up from a chair using your arms (e.g., wheelchair or bedside chair)?: A Lot Help needed to walk in hospital room?: Total Help needed climbing 3-5 steps with a railing? : Total 6 Click Score: 12    End of Session Equipment Utilized During Treatment: Gait belt Activity Tolerance: Patient limited by pain Patient left: with call bell/phone within reach;with chair alarm set;in bed Nurse Communication: Mobility status PT Visit Diagnosis: Unsteadiness on feet (R26.81);Other abnormalities of gait and mobility (R26.89);Muscle weakness (generalized) (M62.81);Repeated falls (R29.6);Pain;Difficulty in walking, not elsewhere classified (R26.2) Pain - Right/Left: Left Pain - part of body: Leg     Time: 6834-1962 PT Time Calculation (min) (ACUTE ONLY): 22  min  Charges:  $Therapeutic Activity: 8-22 mins                    Joaquin Music PT DPT 2:38 PM,05/25/22

## 2022-05-25 NOTE — Progress Notes (Signed)
Attempted to place pt on CPAP X2, each time pt states he needs to get up and go to the bathroom.

## 2022-05-25 NOTE — TOC Initial Note (Signed)
Transition of Care [facility]) - Initial/Assessment Note    Patient Details  Name: Anthony [name] MRN: [id number] Date of Birth: 02-04-1936  [facility] CM/SW Contact:    Coralee Pesa, [place] Phone Number: 05/25/2022, 10:29 AM  Clinical Narrative:                 CSW noted pt is experiencing confusion, and reached out to pt's son Timmothy Sours. CSW advised Timmothy Sours of the recommendation for SNF after DC. Son is agreeable and notes a preference for [facility], or [facility]. Agreeable to fax out for other options. Son states pt lives alone in a [place], and was completely independent. Pt drove and was AOX4. CSW to send out referrals and follow up with family. Pt has met his 3 midnight stay requirement for Medicare. TOC will continue to follow for DC needs.  Expected Discharge Plan: Skilled Nursing Facility Barriers to Discharge: SNF Pending bed offer, Continued Medical Work up   Patient Goals and CMS Choice Patient states their goals for this hospitalization and ongoing recovery are:: Pt disoriented and unable to participate in goal setting. CMS Medicare.gov Compare Post Acute Care list provided to:: Patient Represenative (must comment) (Son) Choice offered to / list presented to : Adult Children  Expected Discharge Plan and Services Expected Discharge Plan: [place] Acute Care Choice: [place] arrangements for the past 2 months: Single Family Home                                      Prior Living Arrangements/Services Living arrangements for the past 2 months: Single Family Home Lives with:: Self Patient language and need for interpreter reviewed:: Yes Do you feel safe going back to the place where you live?: Yes      Need for Family Participation in Patient Care: Yes (Comment) Care giver support system in place?: Yes (comment)   Criminal Activity/Legal Involvement Pertinent to Current Situation/Hospitalization: No - Comment  as needed  Activities of Daily Living Home Assistive Devices/Equipment: Walker (specify type), Cane (specify quad or straight) ADL Screening (condition at time of admission) Patient's cognitive ability adequate to safely complete daily activities?: Yes Is the patient deaf or have difficulty hearing?: No Does the patient have difficulty seeing, even when wearing glasses/contacts?: No Does the patient have difficulty concentrating, remembering, or making decisions?: No Patient able to express need for assistance with ADLs?: Yes Does the patient have difficulty dressing or bathing?: No Independently performs ADLs?: Yes (appropriate for developmental age) Does the patient have difficulty walking or climbing stairs?: Yes Weakness of Legs: Both Weakness of Arms/Hands: None  Permission Sought/Granted Permission sought to share information with : Family Supports Permission granted to share information with : Yes, Verbal Permission Granted  Share Information with NAME: Zander Ingham     Permission granted to share info w Relationship: Son  Permission granted to share info w Contact Information: [phone]  Emotional Assessment Appearance:: Appears stated age Attitude/Demeanor/Rapport: Unable to Assess Affect (typically observed): Unable to Assess Orientation: : Oriented to Self, Oriented to Place Alcohol / Substance Use: Not Applicable Psych Involvement: No (comment)  Admission diagnosis:  Weakness [R53.1] Acute kidney injury ([place]) [N17.9] Sepsis ([place]) [A41.9] Acute midline low back pain with bilateral sciatica [M54.42, M54.41] Leukocytosis, unspecified type [D72.829] Patient Active Problem List   Diagnosis Date Noted   Coag negative Staphylococcus  bacteremia    Sepsis ([place]) 05/22/2022   Acute kidney injury ([place])    Acute midline low back pain with bilateral sciatica    Leukocytosis    Lumbar radiculopathy 05/08/2022   Lumbar spondylosis 01/31/2022   Lumbar degenerative disc  disease 01/31/2022   Chronic pain syndrome 01/31/2022   Medicare annual wellness visit, subsequent 12/29/2021   Prediabetes 12/29/2021   Fatigue 12/31/2020   S/P endovascular aneurysm repair 04/2020   Eosinophilia 04/11/2020   Varicose veins of leg with pain, bilateral 04/06/2020   General unsteadiness 02/07/2020   Encounter for chronic pain management 07/04/2019   Essential tremor 05/07/2019   Cystic kidney disease 04/15/2019   Osteoarthritis 04/15/2019   Seasonal allergies 04/15/2019   Anosmia 02/17/2019   Advanced care planning/counseling discussion 11/05/2018   Vitamin D deficiency 10/24/2018   S/P insertion of spinal cord stimulator 09/09/2018   CKD (chronic kidney disease) stage 3, GFR 30-59 ml/min (HCC) 10/18/2017   Therapeutic opioid-induced constipation (OIC) 10/03/2017   Nasal congestion 06/26/2017   Chronic insomnia    Essential hypertension    Hypothyroidism    Severe obesity (BMI 35.0-39.9) with comorbidity ([place])    CAD (coronary artery disease)    Chronic lower back pain    History of atrial flutter 04/15/2017   History of basal cell carcinoma (BCC) 04/12/2017   History of squamous cell carcinoma in situ of skin 04/12/2017   Hypercholesterolemia with hypertriglyceridemia 04/12/2017   Chronic constipation 04/12/2017   OSA on CPAP 04/12/2017   BPH (benign prostatic hyperplasia) 29/56/2130   Diastolic heart failure ([place]) 02/18/2017   Abdominal aortic aneurysm (AAA) without rupture ([place]) 01/09/2016   Calculus of gallbladder without cholecystitis 01/09/2016   Diverticulitis of sigmoid colon 01/09/2016   Spondylolisthesis of lumbar region 01/09/2016   Recurrent major depression in remission ([place]) 03/28/2013   CAD in native artery 07/11/2012   Esophageal reflux 09/21/2011   Left leg pain 09/21/2011   Spinal stenosis 04/20/2011   PCP:  Ria Bush, MD Pharmacy:   [facility] [place], [place] - [address] [place] [address] Phone: [phone] Fax: [phone]     Social Determinants of Health (SDOH) Interventions    Readmission Risk Interventions     No data to display

## 2022-05-25 NOTE — Progress Notes (Signed)
OT Cancellation Note  Patient Details Name: Anthony [name] MRN: [id number] DOB: 12-Dec-1935   Cancelled Treatment:    Reason Eval/Treat Not Completed: Other (comment). Upon attempt, pt with nurse techs for care. Will re-attempt OT tx at later date/time as appropriate.   Ardeth Perfect., MPH, MS, OTR/L ascom [phone] 05/25/22, 4:31 PM

## 2022-05-26 ENCOUNTER — Inpatient Hospital Stay: Payer: Medicare Other

## 2022-05-26 ENCOUNTER — Inpatient Hospital Stay (HOSPITAL_COMMUNITY)
Admit: 2022-05-26 | Discharge: 2022-05-26 | Disposition: A | Discharge status: Home / Self Care | Payer: Medicare Other | Attending: Infectious Diseases | Admitting: Infectious Diseases

## 2022-05-26 DIAGNOSIS — N179 Acute kidney failure, unspecified: Secondary | ICD-10-CM | POA: Diagnosis not present

## 2022-05-26 DIAGNOSIS — R7881 Bacteremia: Secondary | ICD-10-CM | POA: Diagnosis not present

## 2022-05-26 DIAGNOSIS — B957 Other staphylococcus as the cause of diseases classified elsewhere: Secondary | ICD-10-CM | POA: Diagnosis not present

## 2022-05-26 DIAGNOSIS — A419 Sepsis, unspecified organism: Secondary | ICD-10-CM | POA: Diagnosis not present

## 2022-05-26 DIAGNOSIS — R6520 Severe sepsis without septic shock: Secondary | ICD-10-CM | POA: Diagnosis not present

## 2022-05-26 LAB — CBC
HCT: 31.2 % — ABNORMAL LOW (ref 39.0–52.0)
Hemoglobin: 10.7 g/dL — ABNORMAL LOW (ref 13.0–17.0)
MCH: 31.1 pg (ref 26.0–34.0)
MCHC: 34.3 g/dL (ref 30.0–36.0)
MCV: 90.7 fL (ref 80.0–100.0)
Platelets: 293 10*3/uL (ref 150–400)
RBC: 3.44 MIL/uL — ABNORMAL LOW (ref 4.22–5.81)
RDW: 14.5 % (ref 11.5–15.5)
WBC: 20.9 10*3/uL — ABNORMAL HIGH (ref 4.0–10.5)
nRBC: 0 % (ref 0.0–0.2)

## 2022-05-26 LAB — BASIC METABOLIC PANEL
Anion gap: 4 — ABNORMAL LOW (ref 5–15)
BUN: 35 mg/dL — ABNORMAL HIGH (ref 8–23)
CO2: 25 mmol/L (ref 22–32)
Calcium: 9.8 mg/dL (ref 8.9–10.3)
Chloride: 110 mmol/L (ref 98–111)
Creatinine, Ser: 1.24 mg/dL (ref 0.61–1.24)
GFR, Estimated: 57 mL/min — ABNORMAL LOW (ref >60)
Glucose, Bld: 160 mg/dL — ABNORMAL HIGH (ref 70–99)
Potassium: 3.3 mmol/L — ABNORMAL LOW (ref 3.5–5.1)
Sodium: 139 mmol/L (ref 135–145)

## 2022-05-26 LAB — HEPATIC FUNCTION PANEL
ALT: 29 U/L (ref 0–44)
AST: 36 U/L (ref 15–41)
Albumin: 2.7 g/dL — ABNORMAL LOW (ref 3.5–5.0)
Alkaline Phosphatase: 88 U/L (ref 38–126)
Bilirubin, Direct: 0.3 mg/dL — ABNORMAL HIGH (ref 0.0–0.2)
Indirect Bilirubin: 1 mg/dL — ABNORMAL HIGH (ref 0.3–0.9)
Total Bilirubin: 1.3 mg/dL — ABNORMAL HIGH (ref 0.3–1.2)
Total Protein: 6.7 g/dL (ref 6.5–8.1)

## 2022-05-26 LAB — ECHOCARDIOGRAM COMPLETE
AR max vel: 3.64 cm2
AV Area VTI: 3.41 cm2
AV Area mean vel: 3.88 cm2
AV Mean grad: 3 mmHg
AV Peak grad: 6.6 mmHg
Ao pk vel: 1.28 m/s
Area-P 1/2: 4.21 cm2
S' Lateral: 3.9 cm

## 2022-05-26 MED ORDER — IOHEXOL 300 MG/ML  SOLN
100.0000 mL | Freq: Once | INTRAMUSCULAR | Status: AC | PRN
  Administered 2022-05-26: 100 mL via INTRAVENOUS

## 2022-05-26 MED ORDER — SENNA 8.6 MG PO TABS
1.0000 | ORAL_TABLET | Freq: Every day | ORAL | Status: DC | PRN
  Administered 2022-06-04 – 2022-06-07 (×2): 8.6 mg via ORAL
  Filled 2022-05-26 (×2): qty 1

## 2022-05-26 MED ORDER — LISINOPRIL 10 MG PO TABS
10.0000 mg | ORAL_TABLET | Freq: Every day | ORAL | Status: DC
  Administered 2022-05-26 – 2022-05-28 (×3): 10 mg via ORAL
  Filled 2022-05-26 (×3): qty 1

## 2022-05-26 MED ORDER — CHLORHEXIDINE GLUCONATE CLOTH 2 % EX PADS
6.0000 | MEDICATED_PAD | Freq: Every day | CUTANEOUS | Status: DC
  Administered 2022-05-26 – 2022-06-08 (×14): 6 via TOPICAL

## 2022-05-26 MED ORDER — POTASSIUM CHLORIDE 20 MEQ PO PACK
40.0000 meq | PACK | Freq: Once | ORAL | Status: AC
  Administered 2022-05-26: 40 meq via ORAL
  Filled 2022-05-26: qty 2

## 2022-05-26 MED ORDER — POLYETHYLENE GLYCOL 3350 17 G PO PACK
17.0000 g | PACK | Freq: Every day | ORAL | Status: DC | PRN
  Administered 2022-06-04: 17 g via ORAL
  Filled 2022-05-26: qty 1

## 2022-05-26 NOTE — Progress Notes (Signed)
Orthopedic Tech Progress Note Patient Details:  Anthony [name] Aug 25, 1936 [id number] Called in order to [facility] for LSO Patient ID: Anthony [name], male   DOB: 1936-02-10, 86 y.o.   MRN: [id number]  [name] 05/26/2022, 11:08 AM

## 2022-05-26 NOTE — Progress Notes (Signed)
   Date of Admission:  05/22/2022     ID: Anthony [name] is a 86 y.o. male  Principal Problem:   Sepsis ([place]) Active Problems:   Coag negative Staphylococcus bacteremia    Subjective: Still has low back pain   Medications:   amLODipine  5 mg Oral Daily   aspirin EC  81 mg Oral Daily   enoxaparin (LOVENOX) injection  0.5 mg/kg Subcutaneous QHS   ezetimibe  10 mg Oral Daily   gabapentin  600 mg Oral QHS   levothyroxine  175 mcg Oral Q0600   linaclotide  290 mcg Oral QAC breakfast   polyethylene glycol  17 g Oral Daily   potassium chloride  40 mEq Oral Once   senna  1 tablet Oral Daily   tamsulosin  0.4 mg Oral Daily    Objective: Vital signs in last 24 hours: Temp:  [98.8 F (37.1 C)-98.9 F (37.2 C)] 98.8 F (37.1 C) (09/29 0525) Pulse Rate:  [87-103] 87 (09/29 0525) Resp:  [18-19] 18 (09/29 0525) BP: (146-155)/(77-79) 155/79 (09/29 0525) SpO2:  [94 %-99 %] 99 % (09/29 0525)    PHYSICAL EXAM:  General:awake, But not as alert he was on day of admission Lungs: b/l air enty. Heart: irregular- well controlled. Abdomen: Soft, non-tender,not distended. Bowel sounds normal. No masses Extremities: atraumatic, no cyanosis. No edema. No clubbing Skin: No rashes or lesions. Or bruising Lymph: Cervical, supraclavicular normal. Neurologic: left lower extremity weakness foley Lab Results Recent Labs    05/25/22 0545 05/26/22 0713  WBC 19.9* 20.9*  HGB 10.7* 10.7*  HCT 31.8* 31.2*  NA 141 139  K 3.5 3.3*  CL 111 110  CO2 23 25  BUN 43* 35*  CREATININE 1.42* 1.24   Microbiology: 05/22/22 Staph capitis bacteremia 4/4 9/29 BC-   Assessment/Plan: Low back pain with radiation down both legs Left leg more weak than right Has underlying degenerative spine disease and after a recent LESI the pain is worse Leucocytosis Staph bacteremia Concern for early discitis/epidural abscess- because the CT scan was done without contrast he needs better imaging   repeat blood  culture Will do 2 d echo On Nafcillin Pt has spine stimulator and that prevents from getting MRI Will get CT with contrast  as  kidney function has improved   AKI on CKD- combination of infection, NSAID use, dehydration and retention post void bladder scan had 900cc of urine and staright cath was 1600cc Has foley cath   Hypothyrodism on synthorid   CAD s/p stent B/l TKA   Discussed the management with patient and care team [facility] on call for urgent issues by phone. Call if needed

## 2022-05-26 NOTE — Progress Notes (Signed)
PROGRESS NOTE    Anthony [name]  DJM:[id number] DOB: 05-15-1936 DOA: 05/22/2022 PCP: Ria Bush, MD   Brief Narrative: This 86 yrs old male with medical history significant of atrial flutter,  status post ablation, chronic back pain status post stent spine stimulator which is malfunctioning, hypertension, hyperlipidemia, hypothyroidism, obesity, obstructive sleep apnea on CPAP comes to the emergency room with complaints of lower back pain radiating bilaterally to both legs,  going down along with increased urinary frequency.  Patient is found to have Staph aureus bacteremia, back pain is concerning for epidural abscess/early discitis.  Infectious diseases consulted recommended IV antibiotics.  Neurosurgery is consulted states no neurosurgical intervention needed, recommended IV steroids and IV antibiotics.  If additional work-up is needed could consider MRI versus CT myelogram.  Assessment & Plan:   Principal Problem:   Sepsis ([place]) Active Problems:   Coag negative Staphylococcus bacteremia  Severe sepsis POA:  Staph bacteremia It could be UTI, back pain with possible discitis given recent procedure. He was found to have tachycardia , elevated white count of 21K, lactic acid of 2.0, increased creatinine to 2.6 from baseline 1.3. Continue IV fluids as per sepsis protocol. Lactic acid normalized, 1.2> 2.0 Infectious disease Dr. Delaine Lame consulted.  Antibiotics changed to daptomycin. Urine culture 20 K of gram-positive cocci. Repeat blood cultures, obtain 2D echo. Antibiotics changed to nafcillin.  Need CT myelogram as renal functions improved.   Chronic back pain with severe degenerative arthritis: Chronic pain /history of spinal cord pain stimulator-- malfunctioning. Patient follows with pain clinic Dr. Zollie Scale as an outpatient, He recently underwent lumbar spinal steroid injection on 20 September 23 CRP 42.2, ESR 10.0 Neurosurgery consultation with Dr. Cari Caraway completed,  given increase in weakness more on the left and right-- recommends brace and follow-up outpatient for malfunctioning pain stimulator. Unable to do MRI given stimulator, needs better imaging. Continue PRN PO and IV pain meds Continue muscle relaxants-- patient is on gabapentin.   AKI on CKD stage IIIb: > Resolved. Baseline creatinine 1.3 Presented  with creatinine of 2.62--2.18>2.25>1.42 > 1.24 Continue IV fluids, monitor input output, monitor creatinine with metabolic panel. Avoid nephrotoxic agents AKI resolved.  Serum creatinine back to baseline.   Leukocytosis: Likely reactive due to steroids versus to infection. --WBC 21K-18K-19.1- 20.9 Patient remained on daptomycin for staph bacteremia. Antibiotic changed to nafcillin.   Elevated troponin: Likely demand ischemia in the setting of sepsis. Patient denies any chest pain He has hx.  of CAD with stent placed in LAD few years ago He has hx. of atrial flutter status post ablation in the past. Patient follows with [facility] cardiology Continue aspirin Plavix and statins.   Hypothyroidism Continue Synthroid.   Hyperlipidemia  Continue Zetia.  Hypokalemia: Replaced.  Continue to monitor    DVT prophylaxis: Lovenox Code Status: DNR Family Communication: No family at bedside Disposition Plan:   Status is: Inpatient Remains inpatient appropriate because: Admitted for worsening lower back pain radiating towards the lower extremities.  Blood cultures positive for Staph aureus , ID and Neurosurgery consulted started on IV daptomycin.  Antibiotic changed to IV nafcillin. Echocardiogram and CT myelogram pending  PT recommended SNF.  Patient is not medically clear.   Consultants:  Neurosurgery Infectious diseases  Procedures: None Antimicrobials: Daptomycin  Subjective: Patient was seen and examined at bedside.  Overnight events noted. Patient continued to complain about having severe lower back pain. His renal functions are now  improved.  He was having breakfast at the bedside.  Objective: Vitals:  05/25/22 0553 05/25/22 0931 05/25/22 2047 05/26/22 0525  BP: (!) 174/122 (!) 158/95 (!) 146/77 (!) 155/79  Pulse: 98 93 (!) 103 87  Resp: _0 Temp: 98.2 F (36.8 C) 99.1 F (37.3 C) 98.9 F (37.2 C) 98.8 F (37.1 C)  TempSrc:      SpO2: 94% 95% 94% 99%    Intake/Output Summary (Last 24 hours) at 05/26/2022 1116 Last data filed at 05/26/2022 2585 Gross per 24 hour  Intake 365.2 ml  Output 4200 ml  Net -3834.8 ml   There were no vitals filed for this visit.  Examination:  General exam: Appears comfortable, NAD, deconditioned, morbidly obese. Respiratory system: CTA bilaterally, respiratory effort normal, RR 16. Cardiovascular system: S1 & S2 heard, regular rate and rhythm, no murmur.   Gastrointestinal system: Abdomen is soft, non tender, non distended, BS+ Central nervous system: Alert and oriented X 3. No focal neurological deficits. Back: Tenderness noted in the lower back, restricted movements. Extremities: No edema, no cyanosis, no clubbing. Skin: No rashes, lesions or ulcers Psychiatry: Judgement and insight appear normal. Mood & affect appropriate.     Data Reviewed: I have personally reviewed following labs and imaging studies  CBC: Recent Labs  Lab 05/22/22 1052 05/23/22 0500 05/24/22 0325 05/25/22 0545 05/26/22 0713  WBC 21.6* 18.9* 19.1* 19.9* 20.9*  HGB 11.8* 10.5* 9.8* 10.7* 10.7*  HCT 36.2* 32.2* 29.3* 31.8* 31.2*  MCV 95.3 95.8 93.9 91.4 90.7  PLT 263 221 208 267 277   Basic Metabolic Panel: Recent Labs  Lab 05/22/22 1052 05/23/22 0500 05/24/22 0325 05/25/22 0545 05/26/22 0713  NA 141 137 139 141 139  K 3.9 3.4* 3.8 3.5 3.3*  CL 105 108 110 111 110  CO2 _1 GLUCOSE 156* 152* 163* 171* 160*  BUN 67* 56* 55* 43* 35*  CREATININE 2.62* 2.14* 2.25* 1.42* 1.24  CALCIUM 10.2 9.3 9.5 9.9 9.8  MG  --   --   --  2.0  --   PHOS  --   --   --  2.9  --     GFR: Estimated Creatinine Clearance: 60 mL/min (by C-G formula based on SCr of 1.24 mg/dL). Liver Function Tests: No results for input(s): "AST", "ALT", "ALKPHOS", "BILITOT", "PROT", "ALBUMIN" in the last 168 hours. No results for input(s): "LIPASE", "AMYLASE" in the last 168 hours. No results for input(s): "AMMONIA" in the last 168 hours. Coagulation Profile: No results for input(s): "INR", "PROTIME" in the last 168 hours. Cardiac Enzymes: Recent Labs  Lab 05/24/22 1501  CKTOTAL 110   BNP (last 3 results) No results for input(s): "PROBNP" in the last 8760 hours. HbA1C: No results for input(s): "HGBA1C" in the last 72 hours. CBG: No results for input(s): "GLUCAP" in the last 168 hours. Lipid Profile: No results for input(s): "CHOL", "HDL", "LDLCALC", "TRIG", "CHOLHDL", "LDLDIRECT" in the last 72 hours. Thyroid Function Tests: No results for input(s): "TSH", "T4TOTAL", "FREET4", "T3FREE", "THYROIDAB" in the last 72 hours. Anemia Panel: No results for input(s): "VITAMINB12", "FOLATE", "FERRITIN", "TIBC", "IRON", "RETICCTPCT" in the last 72 hours. Sepsis Labs: Recent Labs  Lab 05/22/22 1243 05/22/22 1530  LATICACIDVEN 1.2 2.0*    Recent Results (from the past 240 hour(s))  Blood culture (routine x 2)     Status: Abnormal   Collection Time: 05/22/22 12:43 PM   Specimen: Right Antecubital; Blood  Result Value Ref Range Status   Specimen Description   Final    RIGHT ANTECUBITAL  Performed at [facility], [address], [place], [place] [address]    Special Requests   Final    BOTTLES DRAWN AEROBIC AND ANAEROBIC Blood Culture adequate volume Performed at [facility], [address] [place], [place] [address]    Culture  Setup Time   Final    GRAM POSITIVE COCCI IN BOTH AEROBIC AND ANAEROBIC BOTTLES CRITICAL RESULT CALLED TO, READ BACK BY AND VERIFIED WITH: [facility] HALLHAJI 05/23/22 8338 MW Performed at [facility], [place] [address]  [place], [place] [address]    Culture STAPHYLOCOCCUS CAPITIS (A)  Final   Report Status 05/25/2022 FINAL  Final   Organism ID, Bacteria STAPHYLOCOCCUS CAPITIS  Final      Susceptibility   Staphylococcus capitis - MIC*    CIPROFLOXACIN <=0.5 SENSITIVE Sensitive     ERYTHROMYCIN <=0.25 SENSITIVE Sensitive     GENTAMICIN <=0.5 SENSITIVE Sensitive     OXACILLIN <=0.25 SENSITIVE Sensitive     TETRACYCLINE <=1 SENSITIVE Sensitive     VANCOMYCIN 1 SENSITIVE Sensitive     TRIMETH/SULFA <=10 SENSITIVE Sensitive     CLINDAMYCIN <=0.25 SENSITIVE Sensitive     RIFAMPIN <=0.5 SENSITIVE Sensitive     Inducible Clindamycin NEGATIVE Sensitive     * STAPHYLOCOCCUS CAPITIS  Blood Culture ID Panel (Reflexed)     Status: Abnormal   Collection Time: 05/22/22 12:43 PM  Result Value Ref Range Status   Enterococcus faecalis NOT DETECTED NOT DETECTED Final   Enterococcus Faecium NOT DETECTED NOT DETECTED Final   Listeria monocytogenes NOT DETECTED NOT DETECTED Final   Staphylococcus species DETECTED (A) NOT DETECTED Final    Comment: CRITICAL RESULT CALLED TO, READ BACK BY AND VERIFIED WITH: SHEMA HALLHAJI 05/23/22 0906 MW    Staphylococcus aureus (BCID) NOT DETECTED NOT DETECTED Final   Staphylococcus epidermidis NOT DETECTED NOT DETECTED Final   Staphylococcus lugdunensis NOT DETECTED NOT DETECTED Final   Streptococcus species NOT DETECTED NOT DETECTED Final   Streptococcus agalactiae NOT DETECTED NOT DETECTED Final   Streptococcus pneumoniae NOT DETECTED NOT DETECTED Final   Streptococcus pyogenes NOT DETECTED NOT DETECTED Final   A.calcoaceticus-baumannii NOT DETECTED NOT DETECTED Final   Bacteroides fragilis NOT DETECTED NOT DETECTED Final   Enterobacterales NOT DETECTED NOT DETECTED Final   Enterobacter cloacae complex NOT DETECTED NOT DETECTED Final   Escherichia coli NOT DETECTED NOT DETECTED Final   Klebsiella aerogenes NOT DETECTED NOT DETECTED Final   Klebsiella oxytoca NOT DETECTED NOT DETECTED  Final   Klebsiella pneumoniae NOT DETECTED NOT DETECTED Final   Proteus species NOT DETECTED NOT DETECTED Final   Salmonella species NOT DETECTED NOT DETECTED Final   Serratia marcescens NOT DETECTED NOT DETECTED Final   Haemophilus influenzae NOT DETECTED NOT DETECTED Final   Neisseria meningitidis NOT DETECTED NOT DETECTED Final   Pseudomonas aeruginosa NOT DETECTED NOT DETECTED Final   Stenotrophomonas maltophilia NOT DETECTED NOT DETECTED Final   Candida albicans NOT DETECTED NOT DETECTED Final   Candida auris NOT DETECTED NOT DETECTED Final   Candida glabrata NOT DETECTED NOT DETECTED Final   Candida krusei NOT DETECTED NOT DETECTED Final   Candida parapsilosis NOT DETECTED NOT DETECTED Final   Candida tropicalis NOT DETECTED NOT DETECTED Final   Cryptococcus neoformans/gattii NOT DETECTED NOT DETECTED Final    Comment: Performed at [facility], [address] [place], [place] [address]  Blood culture (routine x 2)     Status: Abnormal   Collection Time: 05/22/22 12:48 PM   Specimen:  Left Antecubital; Blood  Result Value Ref Range Status   Specimen Description   Final    LEFT ANTECUBITAL Performed at [facility], [address], [place], [place] [address]    Special Requests   Final    BOTTLES DRAWN AEROBIC AND ANAEROBIC Blood Culture adequate volume Performed at [facility], [address] [place], [place] [address]    Culture  Setup Time   Final    GRAM POSITIVE COCCI IN BOTH AEROBIC AND ANAEROBIC BOTTLES CRITICAL RESULT CALLED TO, READ BACK BY AND VERIFIED WITH: Wilhelmina Mcardle 05/23/22 6015 MW Performed at [facility], [address] [place], [place] [address]    Culture (A)  Final    STAPHYLOCOCCUS CAPITIS SUSCEPTIBILITIES PERFORMED ON PREVIOUS CULTURE WITHIN THE LAST 5 DAYS. Performed at [facility], [place] [address], [place], [place] [address]    Report Status 05/25/2022 FINAL  Final  Urine Culture     Status:  Abnormal   Collection Time: 05/22/22  3:28 PM   Specimen: Urine, Clean Catch  Result Value Ref Range Status   Specimen Description   Final    URINE, CLEAN CATCH Performed at [facility], [address], [place], [place] [address]    Special Requests   Final    NONE Performed at [facility], [address] [place], [place] [address]    Culture (A)  Final    20,000 COLONIES/mL AEROCOCCUS SPECIES Standardized susceptibility testing for this organism is not available. Performed at [facility], [place] [address], [place], [place] [address]    Report Status 05/24/2022 FINAL  Final    Radiology Studies: No results found.  Scheduled Meds:  amLODipine  5 mg Oral Daily   aspirin EC  81 mg Oral Daily   enoxaparin (LOVENOX) injection  0.5 mg/kg Subcutaneous QHS   ezetimibe  10 mg Oral Daily   gabapentin  600 mg Oral QHS   levothyroxine  175 mcg Oral Q0600   linaclotide  290 mcg Oral QAC breakfast   polyethylene glycol  17 g Oral Daily   potassium chloride  40 mEq Oral Once   senna  1 tablet Oral Daily   tamsulosin  0.4 mg Oral Daily   Continuous Infusions:  nafcillin 12 g in sodium chloride 0.9 % 500 mL continuous infusion Stopped (05/26/22 0519)     LOS: 4 days    Time spent: 35 mins    Shigeo Baugh, MD [facility]   If 7PM-7AM, please contact night-coverage

## 2022-05-26 NOTE — Progress Notes (Signed)
*  PRELIMINARY RESULTS* Echocardiogram 2D Echocardiogram has been performed.  Anthony [name] 05/26/2022, 1:54 PM

## 2022-05-26 NOTE — Progress Notes (Signed)
OT Cancellation Note  Patient Details Name: PHIL MICHELS MRN: [id number] DOB: 01/12/1936   Cancelled Treatment:    Reason Eval/Treat Not Completed: Patient at procedure or test/ unavailable. Pt out of the room upon attempt this afternoon for OT tx. Per chart, pt pending CT scan. Will re-attempt OT tx at later date/time as pt is available and appropriate.   Ardeth Perfect., MPH, MS, OTR/L ascom [phone] 05/26/22, 3:51 PM

## 2022-05-26 NOTE — TOC Progression Note (Signed)
Transition of Care [facility]) - Progression Note    Patient Details  Name: Anthony [name] MRN: [id number] Date of Birth: 1936/05/08  Transition [facility] CM/SW [place], LCSW Phone Number: 05/26/2022, 1:34 PM  Clinical Narrative:    Reached out to Magda Paganini at [facility] who said she can make a bed offer, no bed until Monday or Tuesday. Per MD, patient not medically ready until Monday or Tuesday at the earliest.  Spoke to patient's son Timmothy Sours who is agreeable to [facility] when patient is medically ready, he stated as long as [facility] does not have a bed at that time.    Expected Discharge Plan: [place] Barriers to Discharge: SNF Pending bed offer, Continued Medical Work up  Expected Discharge Plan and Services Expected Discharge Plan: [place] Choice: [place] arrangements for the past 2 months: Single Family Home                                       Social Determinants of Health (SDOH) Interventions    Readmission Risk Interventions     No data to display

## 2022-05-26 NOTE — Plan of Care (Signed)

## 2022-05-26 NOTE — Progress Notes (Signed)
PT Cancellation Note  Patient Details Name: Anthony [name] MRN: [id number] DOB: 12-May-1936   Cancelled Treatment:    Reason Eval/Treat Not Completed: Patient at procedure or test/unavailable Attempted to see pt for PT tx but transport in room preparing to take pt to CT scan.  Will f/u as able & as pt is available.  [name], PT, DPT 05/26/22, 3:19 PM   [name] 05/26/2022, 3:18 PM

## 2022-05-27 ENCOUNTER — Inpatient Hospital Stay: Payer: Medicare Other

## 2022-05-27 DIAGNOSIS — N179 Acute kidney failure, unspecified: Secondary | ICD-10-CM | POA: Diagnosis not present

## 2022-05-27 DIAGNOSIS — A419 Sepsis, unspecified organism: Secondary | ICD-10-CM | POA: Diagnosis not present

## 2022-05-27 DIAGNOSIS — R6520 Severe sepsis without septic shock: Secondary | ICD-10-CM | POA: Diagnosis not present

## 2022-05-27 LAB — BASIC METABOLIC PANEL
Anion gap: 6 (ref 5–15)
BUN: 31 mg/dL — ABNORMAL HIGH (ref 8–23)
CO2: 27 mmol/L (ref 22–32)
Calcium: 9.9 mg/dL (ref 8.9–10.3)
Chloride: 111 mmol/L (ref 98–111)
Creatinine, Ser: 1.2 mg/dL (ref 0.61–1.24)
GFR, Estimated: 60 mL/min — ABNORMAL LOW (ref >60)
Glucose, Bld: 173 mg/dL — ABNORMAL HIGH (ref 70–99)
Potassium: 3.1 mmol/L — ABNORMAL LOW (ref 3.5–5.1)
Sodium: 144 mmol/L (ref 135–145)

## 2022-05-27 LAB — URINALYSIS, ROUTINE W REFLEX MICROSCOPIC
Bilirubin Urine: NEGATIVE
Glucose, UA: 50 mg/dL — AB
Ketones, ur: NEGATIVE mg/dL
Nitrite: NEGATIVE
Protein, ur: 100 mg/dL — AB
RBC / HPF: >50 RBC/hpf — ABNORMAL HIGH (ref 0–5)
Specific Gravity, Urine: 1.021 (ref 1.005–1.030)
WBC, UA: >50 WBC/hpf — ABNORMAL HIGH (ref 0–5)
pH: 5 (ref 5.0–8.0)

## 2022-05-27 LAB — CBC
HCT: 32.2 % — ABNORMAL LOW (ref 39.0–52.0)
Hemoglobin: 10.9 g/dL — ABNORMAL LOW (ref 13.0–17.0)
MCH: 31 pg (ref 26.0–34.0)
MCHC: 33.9 g/dL (ref 30.0–36.0)
MCV: 91.5 fL (ref 80.0–100.0)
Platelets: 360 10*3/uL (ref 150–400)
RBC: 3.52 MIL/uL — ABNORMAL LOW (ref 4.22–5.81)
RDW: 14.9 % (ref 11.5–15.5)
WBC: 25.6 10*3/uL — ABNORMAL HIGH (ref 4.0–10.5)
nRBC: 0 % (ref 0.0–0.2)

## 2022-05-27 MED ORDER — AMLODIPINE BESYLATE 5 MG PO TABS: 5.0000 mg | ORAL_TABLET | Freq: Every day | ORAL | Status: DC

## 2022-05-27 MED ORDER — FINASTERIDE 5 MG PO TABS
5.0000 mg | ORAL_TABLET | Freq: Every day | ORAL | Status: DC
  Administered 2022-05-27 – 2022-06-08 (×12): 5 mg via ORAL
  Filled 2022-05-27 (×12): qty 1

## 2022-05-27 MED ORDER — POTASSIUM CHLORIDE 20 MEQ PO PACK
40.0000 meq | PACK | Freq: Once | ORAL | Status: AC
  Administered 2022-05-27: 40 meq via ORAL
  Filled 2022-05-27: qty 2

## 2022-05-27 MED ORDER — FUROSEMIDE 10 MG/ML IJ SOLN
40.0000 mg | Freq: Once | INTRAMUSCULAR | Status: AC
  Administered 2022-05-27: 40 mg via INTRAVENOUS
  Filled 2022-05-27: qty 4

## 2022-05-27 MED ORDER — FUROSEMIDE 40 MG PO TABS
40.0000 mg | ORAL_TABLET | Freq: Every day | ORAL | Status: DC
  Administered 2022-05-28 – 2022-06-08 (×10): 40 mg via ORAL
  Filled 2022-05-27 (×10): qty 1

## 2022-05-27 MED ORDER — LINACLOTIDE 145 MCG PO CAPS: 145.0000 ug | ORAL_CAPSULE | Freq: Every day | ORAL | Status: DC

## 2022-05-27 MED ORDER — TAMSULOSIN HCL 0.4 MG PO CAPS: 0.4000 mg | ORAL_CAPSULE | Freq: Every day | ORAL | Status: DC

## 2022-05-27 MED ORDER — LEVOTHYROXINE SODIUM 50 MCG PO TABS: 175.0000 ug | ORAL_TABLET | Freq: Every day | ORAL | Status: DC

## 2022-05-27 NOTE — Significant Event (Signed)
Rapid Response Event Note   Reason for Call : called RRT for increased work of breathing/ red mews   Initial Focused Assessment: laying in bed, shallow breathing, able to speak, answer questions, disoriented to place, time, and situation, ongoing pain issues.      Interventions: Dr Dwyane Dee to bedside, Morphine IV given for pain, will continue to monitor. MD discussed plan with RN Holli.   Plan of Care: as above, Robyn, charge RN and [facility], RN to call if further assistance needed.    Event Summary: as above  MD Notified: Kumar 1225 Call Gentry A, RN

## 2022-05-27 NOTE — Progress Notes (Signed)
PROGRESS NOTE    Anthony [name]  ZOX:[id number] DOB: 1936-02-15 DOA: 05/22/2022 PCP: Ria Bush, MD   Brief Narrative: This 86 yrs old male with medical history significant of atrial flutter,  status post ablation, chronic back pain status post stent spine stimulator which is malfunctioning, hypertension, hyperlipidemia, hypothyroidism, obesity, obstructive sleep apnea on CPAP comes to the emergency room with complaints of lower back pain radiating bilaterally to both legs,  going down along with increased urinary frequency.  Patient is found to have Staph aureus bacteremia, back pain is concerning for epidural abscess/early discitis.  Infectious diseases consulted recommended IV antibiotics.  Neurosurgery is consulted states no neurosurgical intervention needed, recommended IV steroids and IV antibiotics.  Echocardiogram no evidence of vegetation.  LVEF 81%, grade 1 diastolic dysfunction. CT lumbar spine with no evidence of abscess or discitis or osteomyelitis.  Assessment & Plan:   Principal Problem:   Sepsis ([place]) Active Problems:   Coag negative Staphylococcus bacteremia  Severe sepsis POA:  Staph bacteremia It could be UTI, back pain with possible discitis given recent procedure. He was found to have tachycardia , elevated white count of 21K, lactic acid of 2.0, increased creatinine to 2.6 from baseline 1.3. Continue IV fluids as per sepsis protocol. Lactic acid normalized, 1.2 > 2.0 Infectious disease Dr. Delaine Lame consulted.  Antibiotics changed to daptomycin. Urine culture 20 K of gram-positive cocci. Follow up Repeat blood cultures, Antibiotics changed to nafcillin.  Echo with no evidence of vegetation, CT lumbar spine no evidence of discitis, abscess or osteomyelitis. WBC count continues to trend up.  Obtain chest x-ray and UA.  Chronic back pain with severe degenerative arthritis: Chronic pain /history of spinal cord pain stimulator-- malfunctioning. Patient follows  with pain clinic Dr. Zollie Scale as an outpatient, He recently underwent lumbar spinal steroid injection on 20 September 23 CRP 42.2, ESR 10.0 Neurosurgery consultation with Dr. Cari Caraway completed, given increase in weakness more on the left and right-- recommends brace and follow-up outpatient for malfunctioning pain stimulator. Unable to do MRI given stimulator, needs better imaging. Continue PRN PO and IV pain meds Continue muscle relaxants-- patient is on gabapentin.   AKI on CKD stage IIIb: > Resolved. Baseline creatinine 1.3 Presented  with creatinine of 2.62--2.18>2.25>1.42 > 1.24 Continue IV fluids, monitor input output, monitor creatinine with metabolic panel. Avoid nephrotoxic agents AKI resolved.  Serum creatinine back to baseline.   Leukocytosis: Likely reactive due to steroids versus to infection. --WBC 21K-18K-19.1- 20.9>25.6 Patient remained on daptomycin for staph bacteremia. Antibiotic changed to nafcillin.   Elevated troponin: Likely demand ischemia in the setting of sepsis. Patient denies any chest pain He has hx.  of CAD with stent placed in LAD few years ago He has hx. of atrial flutter status post ablation in the past. Patient follows with [facility] cardiology Continue aspirin Plavix and statins.   Hypothyroidism Continue Synthroid.   Hyperlipidemia  Continue Zetia.  Hypokalemia: Replaced.  Continue to monitor    DVT prophylaxis: Lovenox Code Status: DNR Family Communication: No family at bedside Disposition Plan:   Status is: Inpatient Remains inpatient appropriate because: Admitted for worsening lower back pain radiating towards the lower extremities.  Blood cultures positive for Staph aureus , ID and Neurosurgery consulted started on IV daptomycin.  Antibiotic changed to IV nafcillin. Echocardiogram and CT myelogram completed, no evidence of vegetation or discitis or osteomyelitis or abscess  PT recommended SNF.  Patient is not medically clear.    Consultants:  Neurosurgery Infectious diseases  Procedures: None  Antimicrobials: Daptomycin, > nafcillin  Subjective: Patient was seen and examined at bedside.  Overnight events noted. Patient continued to remain with severe low back pain. His renal functions are now improved.  He was having breakfast at the bedside.  Objective: Vitals:   05/27/22 0741 05/27/22 1047 05/27/22 1200 05/27/22 1226  BP: (!) 156/93 (!) 150/94  (!) 149/98  Pulse: (!) 102 (!) 118  (!) 122  Resp: 18 20 (!) 30 (!) 32  Temp: 98.7 F (37.1 C) 97.8 F (36.6 C)  98.1 F (36.7 C)  TempSrc:  Oral    SpO2: 92% 94%  94%    Intake/Output Summary (Last 24 hours) at 05/27/2022 1252 Last data filed at 05/27/2022 1104 Gross per 24 hour  Intake 662.32 ml  Output 3400 ml  Net -2737.68 ml   There were no vitals filed for this visit.  Examination:  General exam: Appears comfortable, NAD, deconditioned, morbidly obese. Respiratory system: CTA bilaterally, respiratory effort normal, RR 15 Cardiovascular system: S1 & S2 heard, regular rate and rhythm, no murmur.   Gastrointestinal system: Abdomen is soft, non tender, non distended, BS+ Central nervous system: Alert and oriented X 3. No focal neurological deficits. Back: Tenderness noted in the lower back, restricted movements. Extremities: No edema, no cyanosis, no clubbing. Skin: No rashes, lesions or ulcers Psychiatry: Judgement and insight appear normal. Mood & affect appropriate.     Data Reviewed: I have personally reviewed following labs and imaging studies  CBC: Recent Labs  Lab 05/23/22 0500 05/24/22 0325 05/25/22 0545 05/26/22 0713 05/27/22 0617  WBC 18.9* 19.1* 19.9* 20.9* 25.6*  HGB 10.5* 9.8* 10.7* 10.7* 10.9*  HCT 32.2* 29.3* 31.8* 31.2* 32.2*  MCV 95.8 93.9 91.4 90.7 91.5  PLT 221 208 267 293 694   Basic Metabolic Panel: Recent Labs  Lab 05/23/22 0500 05/24/22 0325 05/25/22 0545 05/26/22 0713 05/27/22 0617  NA 137 139 141 139  144  K 3.4* 3.8 3.5 3.3* 3.1*  CL 108 110 111 110 111  CO2 _0 GLUCOSE 152* 163* 171* 160* 173*  BUN 56* 55* 43* 35* 31*  CREATININE 2.14* 2.25* 1.42* 1.24 1.20  CALCIUM 9.3 9.5 9.9 9.8 9.9  MG  --   --  2.0  --   --   PHOS  --   --  2.9  --   --    GFR: Estimated Creatinine Clearance: 62 mL/min (by C-G formula based on SCr of 1.2 mg/dL). Liver Function Tests: Recent Labs  Lab 05/26/22 0713  AST 36  ALT 29  ALKPHOS 88  BILITOT 1.3*  PROT 6.7  ALBUMIN 2.7*   No results for input(s): "LIPASE", "AMYLASE" in the last 168 hours. No results for input(s): "AMMONIA" in the last 168 hours. Coagulation Profile: No results for input(s): "INR", "PROTIME" in the last 168 hours. Cardiac Enzymes: Recent Labs  Lab 05/24/22 1501  CKTOTAL 110   BNP (last 3 results) No results for input(s): "PROBNP" in the last 8760 hours. HbA1C: No results for input(s): "HGBA1C" in the last 72 hours. CBG: No results for input(s): "GLUCAP" in the last 168 hours. Lipid Profile: No results for input(s): "CHOL", "HDL", "LDLCALC", "TRIG", "CHOLHDL", "LDLDIRECT" in the last 72 hours. Thyroid Function Tests: No results for input(s): "TSH", "T4TOTAL", "FREET4", "T3FREE", "THYROIDAB" in the last 72 hours. Anemia Panel: No results for input(s): "VITAMINB12", "FOLATE", "FERRITIN", "TIBC", "IRON", "RETICCTPCT" in the last 72 hours. Sepsis Labs: Recent Labs  Lab 05/22/22 1243 05/22/22 1530  LATICACIDVEN 1.2 2.0*    Recent Results (from the past 240 hour(s))  Blood culture (routine x 2)     Status: Abnormal   Collection Time: 05/22/22 12:43 PM   Specimen: Right Antecubital; Blood  Result Value Ref Range Status   Specimen Description   Final    RIGHT ANTECUBITAL Performed at [facility], [address] [place], [place] [address]    Special Requests   Final    BOTTLES DRAWN AEROBIC AND ANAEROBIC Blood Culture adequate volume Performed at [facility], [address] [place], [place] [address]    Culture  Setup Time   Final    GRAM POSITIVE COCCI IN BOTH AEROBIC AND ANAEROBIC BOTTLES CRITICAL RESULT CALLED TO, READ BACK BY AND VERIFIED WITH: [facility] HALLHAJI 05/23/22 5638 MW Performed at [facility], [place] [address] [place], [place] [address]    Culture STAPHYLOCOCCUS CAPITIS (A)  Final   Report Status 05/25/2022 FINAL  Final   Organism ID, Bacteria STAPHYLOCOCCUS CAPITIS  Final      Susceptibility   Staphylococcus capitis - MIC*    CIPROFLOXACIN <=0.5 SENSITIVE Sensitive     ERYTHROMYCIN <=0.25 SENSITIVE Sensitive     GENTAMICIN <=0.5 SENSITIVE Sensitive     OXACILLIN <=0.25 SENSITIVE Sensitive     TETRACYCLINE <=1 SENSITIVE Sensitive     VANCOMYCIN 1 SENSITIVE Sensitive     TRIMETH/SULFA <=10 SENSITIVE Sensitive     CLINDAMYCIN <=0.25 SENSITIVE Sensitive     RIFAMPIN <=0.5 SENSITIVE Sensitive     Inducible Clindamycin NEGATIVE Sensitive     * STAPHYLOCOCCUS CAPITIS  Blood Culture ID Panel (Reflexed)     Status: Abnormal   Collection Time: 05/22/22 12:43 PM  Result Value Ref Range Status   Enterococcus faecalis NOT DETECTED NOT DETECTED Final   Enterococcus Faecium NOT DETECTED NOT DETECTED Final   Listeria monocytogenes NOT DETECTED NOT DETECTED Final   Staphylococcus species DETECTED (A) NOT DETECTED Final    Comment: CRITICAL RESULT CALLED TO, READ BACK BY AND VERIFIED WITH: SHEMA HALLHAJI 05/23/22 0906 MW    Staphylococcus aureus (BCID) NOT DETECTED NOT DETECTED Final   Staphylococcus epidermidis NOT DETECTED NOT DETECTED Final   Staphylococcus lugdunensis NOT DETECTED NOT DETECTED Final   Streptococcus species NOT DETECTED NOT DETECTED Final   Streptococcus agalactiae NOT DETECTED NOT DETECTED Final   Streptococcus pneumoniae NOT DETECTED NOT DETECTED Final   Streptococcus pyogenes NOT DETECTED NOT DETECTED Final   A.calcoaceticus-baumannii NOT DETECTED NOT DETECTED Final   Bacteroides fragilis NOT DETECTED NOT DETECTED Final    Enterobacterales NOT DETECTED NOT DETECTED Final   Enterobacter cloacae complex NOT DETECTED NOT DETECTED Final   Escherichia coli NOT DETECTED NOT DETECTED Final   Klebsiella aerogenes NOT DETECTED NOT DETECTED Final   Klebsiella oxytoca NOT DETECTED NOT DETECTED Final   Klebsiella pneumoniae NOT DETECTED NOT DETECTED Final   Proteus species NOT DETECTED NOT DETECTED Final   Salmonella species NOT DETECTED NOT DETECTED Final   Serratia marcescens NOT DETECTED NOT DETECTED Final   Haemophilus influenzae NOT DETECTED NOT DETECTED Final   Neisseria meningitidis NOT DETECTED NOT DETECTED Final   Pseudomonas aeruginosa NOT DETECTED NOT DETECTED Final   Stenotrophomonas maltophilia NOT DETECTED NOT DETECTED Final   Candida albicans NOT DETECTED NOT DETECTED Final   Candida auris NOT DETECTED NOT DETECTED Final   Candida glabrata NOT DETECTED NOT DETECTED Final   Candida krusei NOT DETECTED NOT DETECTED Final   Candida parapsilosis NOT DETECTED NOT DETECTED Final  Candida tropicalis NOT DETECTED NOT DETECTED Final   Cryptococcus neoformans/gattii NOT DETECTED NOT DETECTED Final    Comment: Performed at [facility], [address] [place], [place] [address]  Blood culture (routine x 2)     Status: Abnormal   Collection Time: 05/22/22 12:48 PM   Specimen: Left Antecubital; Blood  Result Value Ref Range Status   Specimen Description   Final    LEFT ANTECUBITAL Performed at [facility], [address], [place], [place] [address]    Special Requests   Final    BOTTLES DRAWN AEROBIC AND ANAEROBIC Blood Culture adequate volume Performed at [facility], [address] [place], [place] [address]    Culture  Setup Time   Final    GRAM POSITIVE COCCI IN BOTH AEROBIC AND ANAEROBIC BOTTLES CRITICAL RESULT CALLED TO, READ BACK BY AND VERIFIED WITH: [facility] HALLHAJI 05/23/22 8127 MW Performed at [facility], [address] [place], [place] [address]     Culture (A)  Final    STAPHYLOCOCCUS CAPITIS SUSCEPTIBILITIES PERFORMED ON PREVIOUS CULTURE WITHIN THE LAST 5 DAYS. Performed at [facility], [place] [address] [place], [place] [address]    Report Status 05/25/2022 FINAL  Final  Urine Culture     Status: Abnormal   Collection Time: 05/22/22  3:28 PM   Specimen: Urine, Clean Catch  Result Value Ref Range Status   Specimen Description   Final    URINE, CLEAN CATCH Performed at [facility], [address], [place], [place] [address]    Special Requests   Final    NONE Performed at [facility], [address] [place], [place] [address]    Culture (A)  Final    20,000 COLONIES/mL AEROCOCCUS SPECIES Standardized susceptibility testing for this organism is not available. Performed at [facility], [place] [address], [place], [place] [address]    Report Status 05/24/2022 FINAL  Final  Culture, blood (Routine X 2) w Reflex to ID Panel     Status: None (Preliminary result)   Collection Time: 05/26/22  8:08 AM   Specimen: BLOOD  Result Value Ref Range Status   Specimen Description BLOOD BLOOD LEFT HAND  Final   Special Requests   Final    BOTTLES DRAWN AEROBIC AND ANAEROBIC Blood Culture adequate volume   Culture   Final    NO GROWTH < 24 HOURS Performed at [facility], [address], [place], [place] [address]    Report Status PENDING  Incomplete  Culture, blood (Routine X 2) w Reflex to ID Panel     Status: None (Preliminary result)   Collection Time: 05/26/22  8:08 AM   Specimen: BLOOD  Result Value Ref Range Status   Specimen Description BLOOD BLOOD LEFT ARM  Final   Special Requests   Final    BOTTLES DRAWN AEROBIC AND ANAEROBIC Blood Culture adequate volume   Culture   Final    NO GROWTH < 24 HOURS Performed at [facility], [address], [place], [place] [address]    Report Status PENDING  Incomplete    Radiology Studies: CT LUMBAR SPINE W CONTRAST  Result Date:  05/26/2022 CLINICAL DATA:  Low back pain, infection suspected concern for epidural abscess EXAM: CT LUMBAR SPINE WITH CONTRAST TECHNIQUE: Multidetector CT imaging of the lumbar spine was performed with intravenous contrast administration. RADIATION DOSE REDUCTION: This exam was performed according to the departmental dose-optimization program which includes automated exposure  control, adjustment of the mA and/or kV according to patient size and/or use of iterative reconstruction technique. CONTRAST:  123m OMNIPAQUE IOHEXOL 300 MG/ML  SOLN COMPARISON:  CT lumbar spine without contrast 05/22/2022 FINDINGS: Segmentation: 5 lumbar-type vertebral bodies. Pseudoarticulation of the elongated left L5 transverse process with the sacral ala, with degenerative changes in both the transverse process and sacrum in this location. Alignment: Trace retrolisthesis of L3 on L4 and L4 on L5. Mild dextrocurvature of the thoracolumbar junction and compensatory levocurvature. Vertebrae: Unchanged bilateral pars defects at L5 with additional fracture involving the left pedicle. Vertebral body heights are otherwise largely preserved. Significant endplate degenerative sclerosis, which is asymmetric to the left at L1-L2 and to the right at L2-L5. Degenerative changes appear overall unchanged from both 05/22/2022 and 10/30/2021 CT abdomen pelvis. Paraspinal and other soft tissues: Redemonstrated infrarenal abdominal aortic aneurysm status post endovascular repair. The aneurysm sac measures up to 4.4 cm, unchanged when remeasured similarly. Cholelithiasis. Right extrarenal pelvis. No abnormal enhancement in the psoas muscles to suggest abscess. No enhancing epidural collection. Disc levels: T11-T12: Disc height loss. Moderate facet arthropathy. Moderate spinal canal stenosis. Severe right and moderate left neural foraminal narrowing. T12-L1: Disc height loss. Moderate disc bulge. Mild facet arthropathy. Moderate spinal canal stenosis. Mild  bilateral neural foraminal narrowing. L1-L2: Disc height loss and moderate disc bulge. Mild facet arthropathy. Ligamentum flavum hypertrophy. Severe spinal canal stenosis. Severe left and moderate right neural foraminal narrowing. L2-L3: Disc height loss with disc osteophyte complex. Moderate right and mild left facet arthropathy. Ligamentum flavum hypertrophy. Severe spinal canal stenosis. No significant neural foraminal narrowing. L3-L4: Trace retrolisthesis. Disc height loss and moderate disc bulge. Moderate facet arthropathy. Severe spinal canal stenosis. Severe right neural foraminal narrowing. L4-L5: Trace retrolisthesis and mild disc bulge. Severe facet arthropathy. Moderate spinal canal stenosis. Moderate bilateral neural foraminal narrowing. L5-S1: Disc height loss and mild disc bulge. Moderate to severe facet arthropathy. Fracture fragments project into the posterior aspect of the spinal canal posterior to the L5 vertebral body. No spinal canal stenosis at the disc level. Moderate right neural foraminal narrowing. IMPRESSION: 1. No evidence of discitis or osteomyelitis. Unchanged endplate degenerative changes, without evidence of epidural enhancement or psoas abscess. If there is persistent concern for infection, this could be better evaluated with an MRI with and without contrast. 2. Otherwise unchanged severe degenerative changes throughout the lumbar spine, with severe spinal canal stenosis and severe neural foraminal narrowing at multiple levels. Electronically Signed   By: AMerilyn BabaM.D.   On: 05/26/2022 19:25   ECHOCARDIOGRAM COMPLETE  Result Date: 05/26/2022    ECHOCARDIOGRAM REPORT   Patient Name:   Anthony STROSCHEINDate of Exam: 05/26/2022 Medical Rec #:  [id number]     Height:       72.0 in Accession #:    [id number]    Weight:       270.0 lb Date of Birth:  [birth year]     BSA:          2.420 m Patient Age:    89years       BP:           155/79 mmHg Patient Gender: M              HR:            102 bpm. Exam Location:  [facility] Procedure: 2D Echo, Color Doppler and Cardiac Doppler Indications:     R78.81 Bacteremia  History:  Patient has no prior history of Echocardiogram examinations.                  CAD; Risk Factors:Hypertension, Dyslipidemia and Sleep Apnea.  Sonographer:     Charmayne Sheer Referring Phys:  [id number] Tsosie Billing Diagnosing Phys: Ida Rogue MD  Sonographer Comments: Suboptimal parasternal window. Image acquisition challenging due to patient body habitus and Image acquisition challenging due to respiratory motion. IMPRESSIONS  1. Left ventricular ejection fraction, by estimation, is 55 %. The left ventricle has normal function. The left ventricle has no regional wall motion abnormalities. Left ventricular diastolic parameters are consistent with Grade I diastolic dysfunction (impaired relaxation).  2. Right ventricular systolic function is normal. The right ventricular size is normal. Tricuspid regurgitation signal is inadequate for assessing PA pressure.  3. The mitral valve is normal in structure. No evidence of mitral valve regurgitation. No evidence of mitral stenosis.  4. The aortic valve is normal in structure. Aortic valve regurgitation is not visualized. Aortic valve sclerosis is present, with no evidence of aortic valve stenosis.  5. The inferior vena cava is normal in size with greater than 50% respiratory variability, suggesting right atrial pressure of 3 mmHg. FINDINGS  Left Ventricle: Left ventricular ejection fraction, by estimation, is 55 %. The left ventricle has normal function. The left ventricle has no regional wall motion abnormalities. The left ventricular internal cavity size was normal in size. There is no left ventricular hypertrophy. Left ventricular diastolic parameters are consistent with Grade I diastolic dysfunction (impaired relaxation). Right Ventricle: The right ventricular size is normal. No increase in right ventricular wall thickness.  Right ventricular systolic function is normal. Tricuspid regurgitation signal is inadequate for assessing PA pressure. Left Atrium: Left atrial size was normal in size. Right Atrium: Right atrial size was normal in size. Pericardium: There is no evidence of pericardial effusion. Mitral Valve: The mitral valve is normal in structure. No evidence of mitral valve regurgitation. No evidence of mitral valve stenosis. There is no evidence of mitral valve vegetation. Tricuspid Valve: The tricuspid valve is normal in structure. Tricuspid valve regurgitation is not demonstrated. No evidence of tricuspid stenosis. There is no evidence of tricuspid valve vegetation. Aortic Valve: The aortic valve is normal in structure. Aortic valve regurgitation is not visualized. Aortic valve sclerosis is present, with no evidence of aortic valve stenosis. Aortic valve mean gradient measures 3.0 mmHg. Aortic valve peak gradient measures 6.6 mmHg. Aortic valve area, by VTI measures 3.41 cm. There is no evidence of aortic valve vegetation. Pulmonic Valve: The pulmonic valve was normal in structure. Pulmonic valve regurgitation is not visualized. No evidence of pulmonic stenosis. There is no evidence of pulmonic valve vegetation. Aorta: The aortic root is normal in size and structure. Venous: The inferior vena cava is normal in size with greater than 50% respiratory variability, suggesting right atrial pressure of 3 mmHg. IAS/Shunts: No atrial level shunt detected by color flow Doppler.  LEFT VENTRICLE PLAX 2D LVIDd:         5.60 cm   Diastology LVIDs:         3.90 cm   LV e' medial:    11.50 cm/s LV PW:         1.20 cm   LV E/e' medial:  6.6 LV IVS:        1.10 cm   LV e' lateral:   16.40 cm/s LVOT diam:     2.50 cm   LV E/e' lateral: 4.6 LV SV:  73 LV SV Index:   30 LVOT Area:     4.91 cm  RIGHT VENTRICLE RV Basal diam:  4.50 cm LEFT ATRIUM             Index        RIGHT ATRIUM           Index LA diam:        5.30 cm 2.19 cm/m   RA  Area:     15.00 cm LA Vol (A2C):   33.9 ml 14.01 ml/m  RA Volume:   31.00 ml  12.81 ml/m LA Vol (A4C):   47.0 ml 19.42 ml/m LA Biplane Vol: 43.7 ml 18.05 ml/m  AORTIC VALVE                    PULMONIC VALVE AV Area (Vmax):    3.64 cm     PV Vmax:       0.57 m/s AV Area (Vmean):   3.88 cm     PV Peak grad:  1.3 mmHg AV Area (VTI):     3.41 cm AV Vmax:           128.00 cm/s AV Vmean:          85.000 cm/s AV VTI:            0.213 m AV Peak Grad:      6.6 mmHg AV Mean Grad:      3.0 mmHg LVOT Vmax:         94.90 cm/s LVOT Vmean:        67.100 cm/s LVOT VTI:          0.148 m LVOT/AV VTI ratio: 0.69  AORTA Ao Root diam: 3.50 cm MITRAL VALVE MV Area (PHT): 4.21 cm     SHUNTS MV Decel Time: 180 msec     Systemic VTI:  0.15 m MV E velocity: 75.50 cm/s   Systemic Diam: 2.50 cm MV A velocity: 105.00 cm/s MV E/A ratio:  0.72 Ida Rogue MD Electronically signed by Ida Rogue MD Signature Date/Time: 05/26/2022/4:51:37 PM    Final     Scheduled Meds:  amLODipine  5 mg Oral Daily   aspirin EC  81 mg Oral Daily   Chlorhexidine Gluconate Cloth  6 each Topical Q0600   enoxaparin (LOVENOX) injection  0.5 mg/kg Subcutaneous QHS   ezetimibe  10 mg Oral Daily   gabapentin  600 mg Oral QHS   levothyroxine  175 mcg Oral Q0600   linaclotide  290 mcg Oral QAC breakfast   lisinopril  10 mg Oral Daily   tamsulosin  0.4 mg Oral Daily   Continuous Infusions:  nafcillin 12 g in sodium chloride 0.9 % 500 mL continuous infusion Stopped (05/27/22 0322)     LOS: 5 days    Time spent: 35 mins    Shun Pletz, MD [facility]   If 7PM-7AM, please contact night-coverage

## 2022-05-27 NOTE — Progress Notes (Signed)
   05/27/22 1200  Assess: MEWS Score  Resp (!) 30  Assess: MEWS Score  MEWS Temp 0  MEWS Systolic 0  MEWS Pulse 2  MEWS RR 2  MEWS LOC 0  MEWS Score 4  MEWS Score Color Red  Assess: if the MEWS score is Yellow or Red  Were vital signs taken at a resting state? Yes  Focused Assessment Change from prior assessment (see assessment flowsheet)  Does the patient meet 2 or more of the SIRS criteria? Yes  Does the patient have a confirmed or suspected source of infection? Yes  Provider and Rapid Response Notified? Yes  MEWS guidelines implemented *See Row Information* Yes  Treat  MEWS Interventions Administered prn meds/treatments  Pain Scale 0-10  Pain Score 8  Pain Type Acute pain  Pain Location Back  Pain Orientation Mid;Lower  Pain Intervention(s) Medication (See eMAR)  Take Vital Signs  Increase Vital Sign Frequency  Red: Q 1hr X 4 then Q 4hr X 4, if remains red, continue Q 4hrs  Escalate  MEWS: Escalate Red: discuss with charge nurse/RN and provider, consider discussing with RRT  Notify: Charge Nurse/RN  Name of Charge Nurse/RN Notified Robin RN  Date Charge Nurse/RN Notified 05/27/22  Time Charge Nurse/RN Notified 1210  Notify: Provider  Provider Name/Title Dwyane Dee  Date Provider Notified 05/27/22  Time Provider Notified 1210  Method of Notification Page  Notification Reason Change in status  Provider response No new orders (came to beside)  Date of Provider Response 05/27/22  Time of Provider Response 1234  Notify: Rapid Response  Name of Rapid Response RN Notified Katie RN  Date Rapid Response Notified 05/27/22  Time Rapid Response Notified 1210  Document  Patient Outcome  (administered morphine prn and watching)  Assess: SIRS CRITERIA  SIRS Temperature  0  SIRS Pulse 1  SIRS Respirations  1  SIRS WBC 1  SIRS Score Sum  3

## 2022-05-27 NOTE — TOC Progression Note (Signed)
Transition of Care [facility]) - Progression Note    Patient Details  Name: Anthony [name] MRN: [id number] Date of Birth: Jul 07, 1936  Transition of Care [facility]) CM/SW [place], LCSW Phone Number: 05/27/2022, 10:43 AM  Clinical Narrative:    CSW was notified by Magda Paganini at [facility] that their DON reviewed patient's records and they are going to have to rescind the bed offer due to behaviors. CSW checked with MD then informed Magda Paganini that per MD, patient had some agitation due to pain but that it is "much better" today, asked if Magda Paganini can reconsider. Awaiting reply.    Expected Discharge Plan: [place] Barriers to Discharge: SNF Pending bed offer, Continued Medical Work up  Expected Discharge Plan and Services Expected Discharge Plan: [place] Choice: [place] arrangements for the past 2 months: Single Family Home                                       Social Determinants of Health (SDOH) Interventions    Readmission Risk Interventions     No data to display

## 2022-05-27 NOTE — Progress Notes (Signed)
Physical Therapy Treatment Patient Details Name: Anthony [name] MRN: [id number] DOB: 07-Dec-1935 Today's Date: 05/27/2022   History of Present Illness This 86 yrs old male with medical history significant of atrial flutter, status post ablation, chronic back pain status post stent spine stimulator which is malfunctioning, hypertension, hyperlipidemia, hypothyroidism, obesity, obstructive sleep apnea on CPAP comes to the emergency room with complaints of lower back pain radiating bilaterally to both legs,  going down along with increased urinary frequency. Patient is found to have Staph aureus bacteremia, back pain is concerning for epidural abscess/early discitis.  Infectious diseases consulted recommended IV antibiotics. Neurosurgery is consulted states no neurosurgical intervention needed, recommended IV steroids and IV antibiotics. If additional work-up is needed could consider MRI versus CT myelogram.    PT Comments    Pt was willing to participate in therapy reporting 3/10 pain in back. Pt was premedicated per RN.  When working on bed mobility to move towards sitting EOB activities, pt attempted using side rails, HOB raised, this PTA cueing on sequence and hand placement and pt unable to come up into full seated position with Max A; pt reporting severe pain 10/10.  Resumed with supine LE and trunk strengthening activities in bed.  RN aware.  Current D/C plan continues to be appropriate.  Recommendations for follow up therapy are one component of a multi-disciplinary discharge planning process, led by the attending physician.  Recommendations may be updated based on patient status, additional functional criteria and insurance authorization.  Follow Up Recommendations  Skilled nursing-short term rehab (<3 hours/day)     Assistance Recommended at Discharge Intermittent Supervision/Assistance  Patient can return home with the following A lot of help with walking and/or transfers;A lot of help with  bathing/dressing/bathroom;Assistance with cooking/housework;Assistance with feeding;Direct supervision/assist for medications management;Assist for transportation;Help with stairs or ramp for entrance   Equipment Recommendations  None recommended by PT    Recommendations for Other Services       Precautions / Restrictions Precautions Precautions: Fall Restrictions Weight Bearing Restrictions: No     Mobility  Bed Mobility Overal bed mobility: Needs Assistance Bed Mobility: Supine to Sit, Sit to Supine     Supine to sit: Max assist Sit to supine: Max assist   General bed mobility comments: Pt attempted to come into a sit from supine, using hand rail and HOB elevated with Max A but pt unable due to severe back pain.    Transfers                   General transfer comment: unable due to severe back pain.    Ambulation/Gait                   Stairs             Wheelchair Mobility    Modified Rankin (Stroke Patients Only)       Balance       Sitting balance - Comments: unable to come into fully upright position due to back pain.                                    Cognition Arousal/Alertness: Suspect due to medications, Lethargic Behavior During Therapy: WFL for tasks assessed/performed Overall Cognitive Status: Impaired/Different from baseline Area of Impairment: Memory, Attention, Problem solving, Following commands  General Comments: uncomfortable by high level of pain, pt had difficulty keeping eye open due to pain and discomfort.        Exercises Other Exercises Other Exercises: Supine: Bil ankle pumps x10, QS x10, hooklying bilateral hip abd/add, hooklying posterior pelvic tilts x5 (attemping bridge).    General Comments        Pertinent Vitals/Pain Pain Assessment Pain Score: 10-Worst pain ever Pain Location: L/S and BLE Pain Descriptors / Indicators: Constant,  Sharp, Shooting, Numbness, Grimacing, Guarding Pain Intervention(s): Limited activity within patient's tolerance    Home Living                          Prior Function            PT Goals (current goals can now be found in the care plan section) Acute Rehab PT Goals Patient Stated Goal: get stronger and decrease pain. PT Goal Formulation: With patient Time For Goal Achievement: 06/07/22 Potential to Achieve Goals: Fair Progress towards PT goals: Not progressing toward goals - comment (Pt in severe pain today and unable to do OOB activity.)    Frequency    Min 2X/week      PT Plan Current plan remains appropriate    Co-evaluation              AM-PAC PT "6 Clicks" Mobility   Outcome Measure  Help needed turning from your back to your side while in a flat bed without using bedrails?: A Little Help needed moving from lying on your back to sitting on the side of a flat bed without using bedrails?: Total Help needed moving to and from a bed to a chair (including a wheelchair)?: A Lot Help needed standing up from a chair using your arms (e.g., wheelchair or bedside chair)?: A Lot Help needed to walk in hospital room?: Total Help needed climbing 3-5 steps with a railing? : Total 6 Click Score: 10    End of Session   Activity Tolerance: Patient limited by pain Patient left: with call bell/phone within reach;with chair alarm set;in bed Nurse Communication: Mobility status PT Visit Diagnosis: Unsteadiness on feet (R26.81);Other abnormalities of gait and mobility (R26.89);Muscle weakness (generalized) (M62.81);Repeated falls (R29.6);Pain;Difficulty in walking, not elsewhere classified (R26.2) Pain - Right/Left: Left Pain - part of body: Leg     Time: 0354-6568 PT Time Calculation (min) (ACUTE ONLY): 32 min  Charges:  $Therapeutic Exercise: 8-22 mins $Therapeutic Activity: 8-22 mins                    Bjorn Loser, PTA  05/27/22, 1:15 PM

## 2022-05-27 NOTE — Progress Notes (Signed)
   05/27/22 1200  Assess: MEWS Score  Resp (!) 30  Assess: MEWS Score  MEWS Temp 0  MEWS Systolic 0  MEWS Pulse 2  MEWS RR 2  MEWS LOC 0  MEWS Score 4  MEWS Score Color Red  Assess: if the MEWS score is Yellow or Red  Were vital signs taken at a resting state? Yes  Focused Assessment Change from prior assessment (see assessment flowsheet)  Does the patient meet 2 or more of the SIRS criteria? Yes  Does the patient have a confirmed or suspected source of infection? Yes  Provider and Rapid Response Notified? Yes  MEWS guidelines implemented *See Row Information* Yes  Treat  Pain Scale 0-10  Pain Score 8  Pain Type Acute pain  Pain Location Back  Pain Orientation Mid;Lower  Pain Intervention(s) Medication (See eMAR)  Take Vital Signs  Increase Vital Sign Frequency  Red: Q 1hr X 4 then Q 4hr X 4, if remains red, continue Q 4hrs  Escalate  MEWS: Escalate Red: discuss with charge nurse/RN and provider, consider discussing with RRT  Notify: Provider  Provider Name/Title Kumar  Date Provider Notified 05/27/22  Time Provider Notified [id number]  Method of Notification Page  Assess: SIRS CRITERIA  SIRS Temperature  0  SIRS Pulse 1  SIRS Respirations  1  SIRS WBC 1  SIRS Score Sum  3

## 2022-05-28 DIAGNOSIS — R6520 Severe sepsis without septic shock: Secondary | ICD-10-CM | POA: Diagnosis not present

## 2022-05-28 DIAGNOSIS — A419 Sepsis, unspecified organism: Secondary | ICD-10-CM | POA: Diagnosis not present

## 2022-05-28 DIAGNOSIS — N179 Acute kidney failure, unspecified: Secondary | ICD-10-CM | POA: Diagnosis not present

## 2022-05-28 LAB — CBC
HCT: 29.6 % — ABNORMAL LOW (ref 39.0–52.0)
Hemoglobin: 9.7 g/dL — ABNORMAL LOW (ref 13.0–17.0)
MCH: 30.6 pg (ref 26.0–34.0)
MCHC: 32.8 g/dL (ref 30.0–36.0)
MCV: 93.4 fL (ref 80.0–100.0)
Platelets: 352 10*3/uL (ref 150–400)
RBC: 3.17 MIL/uL — ABNORMAL LOW (ref 4.22–5.81)
RDW: 15.3 % (ref 11.5–15.5)
WBC: 25.4 10*3/uL — ABNORMAL HIGH (ref 4.0–10.5)
nRBC: 0 % (ref 0.0–0.2)

## 2022-05-28 LAB — BASIC METABOLIC PANEL
Anion gap: 6 (ref 5–15)
BUN: 50 mg/dL — ABNORMAL HIGH (ref 8–23)
CO2: 26 mmol/L (ref 22–32)
Calcium: 9.4 mg/dL (ref 8.9–10.3)
Chloride: 110 mmol/L (ref 98–111)
Creatinine, Ser: 1.34 mg/dL — ABNORMAL HIGH (ref 0.61–1.24)
GFR, Estimated: 52 mL/min — ABNORMAL LOW (ref >60)
Glucose, Bld: 166 mg/dL — ABNORMAL HIGH (ref 70–99)
Potassium: 3.2 mmol/L — ABNORMAL LOW (ref 3.5–5.1)
Sodium: 142 mmol/L (ref 135–145)

## 2022-05-28 MED ORDER — POTASSIUM CHLORIDE 20 MEQ PO PACK
40.0000 meq | PACK | Freq: Once | ORAL | Status: AC
  Administered 2022-05-28: 40 meq via ORAL
  Filled 2022-05-28: qty 2

## 2022-05-28 MED ORDER — SODIUM CHLORIDE 0.9 % IV SOLN
2.0000 g | INTRAVENOUS | Status: DC
  Administered 2022-05-28 – 2022-05-29 (×2): 2 g via INTRAVENOUS
  Filled 2022-05-28: qty 12.5
  Filled 2022-05-28: qty 2
  Filled 2022-05-28: qty 12.5

## 2022-05-28 MED ORDER — NAPROXEN 500 MG PO TABS
500.0000 mg | ORAL_TABLET | Freq: Two times a day (BID) | ORAL | Status: DC
  Administered 2022-05-28 – 2022-05-29 (×2): 500 mg via ORAL
  Filled 2022-05-28 (×2): qty 1

## 2022-05-28 NOTE — Consult Note (Signed)
Pharmacy Antibiotic Note  Anthony [name] is a 86 y.o. male admitted on 05/22/2022 with bacteremia. PMH significant for AFlutter s/p ablation, chronic back pain s/p stent spine stimulator (currently malfunctioning), HTN, HLD, hypothyroidism, obesity, OSA on CPAP. Patient presenting after mechanical fall on 9/25 and found to have AKI with Scr 2.62 in ED with recovery to 2.14 after IV fluid administration (baseline 1.1-1.4). Of note, patient had recent lumbar epidural injection on 9/20 and is being followed by neuro. Patient with persistent leukocytosis despite treatment with nafcillin. Repeat UA on 9/30 with >50 WBC, >50 RBC.  Pharmacy has been consulted for Cefepime dosing.    Plan: Day 6 of antibiotics Initiate Cefepime 2g Q24H for UTI  Patient is also on Nafcillin 12g IV Q24H as continuous infusion for S. captitis bacteremia Continue to monitor renal function and follow culture results    Temp (24hrs), Avg:98.4 F (36.9 C), Min:97.8 F (36.6 C), Max:99.3 F (37.4 C)  Recent Labs  Lab 05/22/22 1243 05/22/22 1530 05/23/22 0500 05/24/22 0325 05/24/22 1300 05/25/22 0545 05/26/22 0713 05/27/22 0617 05/28/22 0620  WBC  --   --    < > 19.1*  --  19.9* 20.9* 25.6* 25.4*  CREATININE  --   --    < > 2.25*  --  1.42* 1.24 1.20 1.34*  LATICACIDVEN 1.2 2.0*  --   --   --   --   --   --   --   VANCORANDOM  --   --   --   --  8  --   --   --   --    < > = values in this interval not displayed.     Estimated Creatinine Clearance: 55.5 mL/min (A) (by C-G formula based on SCr of 1.34 mg/dL (H)).    Allergies  Allergen Reactions   Questran [Cholestyramine]     Patient not aware of an allergy to this medicine.   Atorvastatin     Muscle pain   Gemfibrozil Other (See Comments)    Muscle pain.   Metformin And Related     Dizziness   Rosuvastatin     Muscle pain    Antimicrobials this admission: 9/25 Ceftriaxone 1g x1 in ED 9/26 Vancomycin 2000 mg x1  9/27 Daptomcyin 950 mg x1 9/28  Nafcillin >>  Dose adjustments this admission: N/A  Microbiology results: 9/25 BCx: 4 out of 4 bottles growing Staphylococcus capitis (pansensitive) 9/25 UCx: 20k Aerococcus spp.  9/29 BCx: NG2D 9/29 Ucx: ordered  Thank you for allowing pharmacy to be a part of this patient's care.  Gretel Acre, PharmD PGY1 Pharmacy Resident 05/28/2022 2:12 PM

## 2022-05-28 NOTE — TOC Progression Note (Addendum)
Transition of Care [facility]) - Progression Note    Patient Details  Name: Anthony [name] MRN: [id number] Date of Birth: 1935/12/12  Transition of Care [facility]) CM/SW [place], LCSW Phone Number: 05/28/2022, 9:08 AM  Clinical Narrative:    Reached out to [facility] with [facility] again to follow up if they are rescinding the bed offer or not.   2:10- Per Magda Paganini at [facility], their DON said they still cannot take patient since he had behaviors previously in admission.  Called patient's son Anthony [name] with update. Provided current bed offers ([facility], [place]) and informed him of pending bed requests ([facility], [facility]). He agreed to review these SNFs and follow up with CSW assigned tomorrow on preference. CSW has asked Tammy at [facility] and Sharyn Lull at [facility] to review referral to see if they can make a bed offer.    Expected Discharge Plan: [place] Barriers to Discharge: SNF Pending bed offer, Continued Medical Work up  Expected Discharge Plan and Services Expected Discharge Plan: [place] Choice: [place] arrangements for the past 2 months: Single Family Home                                       Social Determinants of Health (SDOH) Interventions    Readmission Risk Interventions     No data to display

## 2022-05-28 NOTE — Progress Notes (Signed)
PROGRESS NOTE    Anthony [name]  GGE:[id number] DOB: 09/17/1935 DOA: 05/22/2022 PCP: Ria Bush, MD   Brief Narrative: This 86 yrs old male with medical history significant of atrial flutter,  status post ablation, chronic back pain status post stent spine stimulator which is malfunctioning, hypertension, hyperlipidemia, hypothyroidism, obesity, obstructive sleep apnea on CPAP comes to the emergency room with complaints of lower back pain radiating bilaterally to both legs,  going down along with increased urinary frequency.  Patient is found to have Staph aureus bacteremia, back pain is concerning for epidural abscess/early discitis.  Infectious diseases consulted recommended IV antibiotics.  Neurosurgery is consulted states no neurosurgical intervention needed, recommended IV steroids and IV antibiotics.  Echocardiogram no evidence of vegetation.  LVEF 46%, grade 1 diastolic dysfunction. CT lumbar spine with no evidence of abscess or discitis or osteomyelitis.  Patient continued to have leukocytosis despite being on nafcillin, UA with positive LE, started on cefepime.  Assessment & Plan:   Principal Problem:   Sepsis ([place]) Active Problems:   Coag negative Staphylococcus bacteremia  Severe sepsis POA:  Staph bacteremia It could be UTI, back pain with possible discitis given recent procedure. He was found to have tachycardia , elevated white count of 21K, lactic acid of 2.0, increased creatinine to 2.6 from baseline 1.3. Continue IV fluids as per sepsis protocol. Lactic acid normalized, 1.2 > 2.0 Infectious disease Dr. Delaine Lame consulted.  Antibiotics changed to daptomycin. Urine culture 20 K of gram-positive cocci. Follow up Repeat blood cultures, Antibiotics changed to nafcillin.  Echo with no evidence of vegetation, CT lumbar spine no evidence of discitis, abscess or osteomyelitis. WBC count continues to trend up.  UA positive LE, chest x-ray unremarkable. Pharmacy consulted  to add cefepime.  Chronic back pain with severe degenerative arthritis: Chronic pain /history of spinal cord pain stimulator-- malfunctioning. Patient follows with pain clinic Dr. Zollie Scale as an outpatient, He recently underwent lumbar spinal steroid injection on 20 September 23 CRP 42.2, ESR 10.0 Neurosurgery consultation with Dr. Cari Caraway completed, given increase in weakness more on the left and right-- recommends brace and follow-up outpatient for malfunctioning pain stimulator. Unable to do MRI given stimulator, needs better imaging. Continue PRN PO and IV pain meds Continue muscle relaxants-- patient is on gabapentin. CT lumbar spine no evidence of discitis, abscess or osteomyelitis.   AKI on CKD stage IIIb: > Resolved. Baseline creatinine 1.3 Presented  with creatinine of 2.62--2.18>2.25>1.42 > 1.24 >1.34 Continue IV fluids, monitor input output, monitor creatinine with metabolic panel. Avoid nephrotoxic agents AKI resolved.  Serum creatinine back to baseline.   Leukocytosis: Likely reactive due to steroids versus to infection. --WBC 21K-18K-19.1- 20.9>25.6 Patient remained on daptomycin for staph bacteremia. Antibiotic changed to nafcillin. UA+ LE Added cefepime for gram-negative coverage.   Elevated troponin: Likely demand ischemia in the setting of sepsis. Patient denies any chest pain He has hx.  of CAD with stent placed in LAD few years ago He has hx. of atrial flutter status post ablation in the past. Patient follows with [facility] cardiology Continue aspirin Plavix and statins.   Hypothyroidism Continue Synthroid.   Hyperlipidemia  Continue Zetia.  Hypokalemia: Replaced.  Continue to monitor    DVT prophylaxis: Lovenox Code Status: DNR Family Communication: No family at bedside Disposition Plan:   Status is: Inpatient Remains inpatient appropriate because: Admitted for worsening lower back pain radiating towards the lower extremities.  Blood cultures positive  for Staph aureus , ID and Neurosurgery consulted,  started on IV  daptomycin.  Antibiotic changed to IV nafcillin. Echocardiogram and CT myelogram completed, no evidence of vegetation or discitis or osteomyelitis or abscess  PT recommended SNF.  Patient is not medically clear.   Consultants:  Neurosurgery Infectious diseases  Procedures: None Antimicrobials: Daptomycin, > nafcillin  Subjective: Patient was seen and examined at bedside.  Overnight events noted. Patient continued to remain with severe lower back pain. His renal functions are improving.  He was having breakfast. Son is at bedside all the questions answered.   Objective: Vitals:   05/27/22 2019 05/28/22 0006 05/28/22 0528 05/28/22 0737  BP: (!) 134/91 138/88 (!) 141/105 130/70  Pulse: (!) 102 95 (!) 104 (!) 104  Resp: 20 (!) '22 18 18  ' Temp: 99.3 F (37.4 C) 98.4 F (36.9 C) 97.8 F (36.6 C) 98.1 F (36.7 C)  TempSrc:      SpO2: 100% 97% 95% 95%    Intake/Output Summary (Last 24 hours) at 05/28/2022 1213 Last data filed at 05/28/2022 0900 Gross per 24 hour  Intake 740 ml  Output 850 ml  Net -110 ml   There were no vitals filed for this visit.  Examination:  General exam: Appears comfortable, NAD, deconditioned, morbidly obese.   Respiratory system: CTA bilaterally, respiratory effort normal, RR 15 Cardiovascular system: S1 & S2 heard, regular rate and rhythm, no murmur.   Gastrointestinal system: Abdomen is soft, non tender, non distended, BS+ Central nervous system: Alert and oriented x2, no focal neurological deficits. Back: Tenderness noted in the lower back, restricted movements. Extremities: No edema, no cyanosis, no clubbing. Skin: No rashes, lesions or ulcers Psychiatry: Judgement and insight appear normal. Mood & affect appropriate.     Data Reviewed: I have personally reviewed following labs and imaging studies  CBC: Recent Labs  Lab 05/24/22 0325 05/25/22 0545 05/26/22 0713  05/27/22 0617 05/28/22 0620  WBC 19.1* 19.9* 20.9* 25.6* 25.4*  HGB 9.8* 10.7* 10.7* 10.9* 9.7*  HCT 29.3* 31.8* 31.2* 32.2* 29.6*  MCV 93.9 91.4 90.7 91.5 93.4  PLT 208 267 293 360 662   Basic Metabolic Panel: Recent Labs  Lab 05/24/22 0325 05/25/22 0545 05/26/22 0713 05/27/22 0617 05/28/22 0620  NA 139 141 139 144 142  K 3.8 3.5 3.3* 3.1* 3.2*  CL 110 111 110 111 110  CO2 '24 23 25 27 26  ' GLUCOSE 163* 171* 160* 173* 166*  BUN 55* 43* 35* 31* 50*  CREATININE 2.25* 1.42* 1.24 1.20 1.34*  CALCIUM 9.5 9.9 9.8 9.9 9.4  MG  --  2.0  --   --   --   PHOS  --  2.9  --   --   --    GFR: Estimated Creatinine Clearance: 55.5 mL/min (A) (by C-G formula based on SCr of 1.34 mg/dL (H)). Liver Function Tests: Recent Labs  Lab 05/26/22 0713  AST 36  ALT 29  ALKPHOS 88  BILITOT 1.3*  PROT 6.7  ALBUMIN 2.7*   No results for input(s): "LIPASE", "AMYLASE" in the last 168 hours. No results for input(s): "AMMONIA" in the last 168 hours. Coagulation Profile: No results for input(s): "INR", "PROTIME" in the last 168 hours. Cardiac Enzymes: Recent Labs  Lab 05/24/22 1501  CKTOTAL 110   BNP (last 3 results) No results for input(s): "PROBNP" in the last 8760 hours. HbA1C: No results for input(s): "HGBA1C" in the last 72 hours. CBG: No results for input(s): "GLUCAP" in the last 168 hours. Lipid Profile: No results for input(s): "CHOL", "HDL", "LDLCALC", "TRIG", "CHOLHDL", "  LDLDIRECT" in the last 72 hours. Thyroid Function Tests: No results for input(s): "TSH", "T4TOTAL", "FREET4", "T3FREE", "THYROIDAB" in the last 72 hours. Anemia Panel: No results for input(s): "VITAMINB12", "FOLATE", "FERRITIN", "TIBC", "IRON", "RETICCTPCT" in the last 72 hours. Sepsis Labs: Recent Labs  Lab 05/22/22 1243 05/22/22 1530  LATICACIDVEN 1.2 2.0*    Recent Results (from the past 240 hour(s))  Blood culture (routine x 2)     Status: Abnormal   Collection Time: 05/22/22 12:43 PM   Specimen:  Right Antecubital; Blood  Result Value Ref Range Status   Specimen Description   Final    RIGHT ANTECUBITAL Performed at [facility], [address] [place], [place] [address]    Special Requests   Final    BOTTLES DRAWN AEROBIC AND ANAEROBIC Blood Culture adequate volume Performed at [facility], [address] [place], [place] [address]    Culture  Setup Time   Final    GRAM POSITIVE COCCI IN BOTH AEROBIC AND ANAEROBIC BOTTLES CRITICAL RESULT CALLED TO, READ BACK BY AND VERIFIED WITH: [facility] HALLHAJI 05/23/22 0981 MW Performed at [facility], [place] [address] [place], [place] [address]    Culture STAPHYLOCOCCUS CAPITIS (A)  Final   Report Status 05/25/2022 FINAL  Final   Organism ID, Bacteria STAPHYLOCOCCUS CAPITIS  Final      Susceptibility   Staphylococcus capitis - MIC*    CIPROFLOXACIN <=0.5 SENSITIVE Sensitive     ERYTHROMYCIN <=0.25 SENSITIVE Sensitive     GENTAMICIN <=0.5 SENSITIVE Sensitive     OXACILLIN <=0.25 SENSITIVE Sensitive     TETRACYCLINE <=1 SENSITIVE Sensitive     VANCOMYCIN 1 SENSITIVE Sensitive     TRIMETH/SULFA <=10 SENSITIVE Sensitive     CLINDAMYCIN <=0.25 SENSITIVE Sensitive     RIFAMPIN <=0.5 SENSITIVE Sensitive     Inducible Clindamycin NEGATIVE Sensitive     * STAPHYLOCOCCUS CAPITIS  Blood Culture ID Panel (Reflexed)     Status: Abnormal   Collection Time: 05/22/22 12:43 PM  Result Value Ref Range Status   Enterococcus faecalis NOT DETECTED NOT DETECTED Final   Enterococcus Faecium NOT DETECTED NOT DETECTED Final   Listeria monocytogenes NOT DETECTED NOT DETECTED Final   Staphylococcus species DETECTED (A) NOT DETECTED Final    Comment: CRITICAL RESULT CALLED TO, READ BACK BY AND VERIFIED WITH: SHEMA HALLHAJI 05/23/22 0906 MW    Staphylococcus aureus (BCID) NOT DETECTED NOT DETECTED Final   Staphylococcus epidermidis NOT DETECTED NOT DETECTED Final   Staphylococcus lugdunensis NOT DETECTED NOT DETECTED Final    Streptococcus species NOT DETECTED NOT DETECTED Final   Streptococcus agalactiae NOT DETECTED NOT DETECTED Final   Streptococcus pneumoniae NOT DETECTED NOT DETECTED Final   Streptococcus pyogenes NOT DETECTED NOT DETECTED Final   A.calcoaceticus-baumannii NOT DETECTED NOT DETECTED Final   Bacteroides fragilis NOT DETECTED NOT DETECTED Final   Enterobacterales NOT DETECTED NOT DETECTED Final   Enterobacter cloacae complex NOT DETECTED NOT DETECTED Final   Escherichia coli NOT DETECTED NOT DETECTED Final   Klebsiella aerogenes NOT DETECTED NOT DETECTED Final   Klebsiella oxytoca NOT DETECTED NOT DETECTED Final   Klebsiella pneumoniae NOT DETECTED NOT DETECTED Final   Proteus species NOT DETECTED NOT DETECTED Final   Salmonella species NOT DETECTED NOT DETECTED Final   Serratia marcescens NOT DETECTED NOT DETECTED Final   Haemophilus influenzae NOT DETECTED NOT DETECTED Final   Neisseria meningitidis NOT DETECTED NOT DETECTED Final   Pseudomonas aeruginosa NOT DETECTED NOT DETECTED Final   Stenotrophomonas  maltophilia NOT DETECTED NOT DETECTED Final   Candida albicans NOT DETECTED NOT DETECTED Final   Candida auris NOT DETECTED NOT DETECTED Final   Candida glabrata NOT DETECTED NOT DETECTED Final   Candida krusei NOT DETECTED NOT DETECTED Final   Candida parapsilosis NOT DETECTED NOT DETECTED Final   Candida tropicalis NOT DETECTED NOT DETECTED Final   Cryptococcus neoformans/gattii NOT DETECTED NOT DETECTED Final    Comment: Performed at [facility], [address] [place], [place] [address]  Blood culture (routine x 2)     Status: Abnormal   Collection Time: 05/22/22 12:48 PM   Specimen: Left Antecubital; Blood  Result Value Ref Range Status   Specimen Description   Final    LEFT ANTECUBITAL Performed at [facility], [address] [place], [place] [address]    Special Requests   Final    BOTTLES DRAWN AEROBIC AND ANAEROBIC Blood Culture adequate  volume Performed at [facility], [address] [place], [place] [address]    Culture  Setup Time   Final    GRAM POSITIVE COCCI IN BOTH AEROBIC AND ANAEROBIC BOTTLES CRITICAL RESULT CALLED TO, READ BACK BY AND VERIFIED WITH: [facility] HALLHAJI 05/23/22 1027 MW Performed at [facility], [address] [place], [place] [address]    Culture (A)  Final    STAPHYLOCOCCUS CAPITIS SUSCEPTIBILITIES PERFORMED ON PREVIOUS CULTURE WITHIN THE LAST 5 DAYS. Performed at [facility], [place] [address] [place], [place] [address]    Report Status 05/25/2022 FINAL  Final  Urine Culture     Status: Abnormal   Collection Time: 05/22/22  3:28 PM   Specimen: Urine, Clean Catch  Result Value Ref Range Status   Specimen Description   Final    URINE, CLEAN CATCH Performed at [facility], [address], [place], [place] [address]    Special Requests   Final    NONE Performed at [facility], [address] [place], [place] [address]    Culture (A)  Final    20,000 COLONIES/mL AEROCOCCUS SPECIES Standardized susceptibility testing for this organism is not available. Performed at [facility], [place] [address] [place], [place] [address]    Report Status 05/24/2022 FINAL  Final  Culture, blood (Routine X 2) w Reflex to ID Panel     Status: None (Preliminary result)   Collection Time: 05/26/22  8:08 AM   Specimen: BLOOD  Result Value Ref Range Status   Specimen Description BLOOD BLOOD LEFT HAND  Final   Special Requests   Final    BOTTLES DRAWN AEROBIC AND ANAEROBIC Blood Culture adequate volume   Culture   Final    NO GROWTH 2 DAYS Performed at [facility], [address] [place],  [address]    Report Status PENDING  Incomplete  Culture, blood (Routine X 2) w Reflex to ID Panel     Status: None (Preliminary result)   Collection Time: 05/26/22  8:08 AM   Specimen: BLOOD  Result Value Ref Range Status   Specimen Description  BLOOD BLOOD LEFT ARM  Final   Special Requests   Final    BOTTLES DRAWN AEROBIC AND ANAEROBIC Blood Culture adequate volume   Culture   Final    NO GROWTH 2 DAYS Performed at [facility], [address], [place],  [address]    Report Status PENDING  Incomplete    Radiology Studies: DG Chest [facility] 1 View  Result  Date: 05/27/2022 CLINICAL DATA:  [id number] leukocytosis EXAM: PORTABLE CHEST 1 VIEW COMPARISON:  October 30, 2021 FINDINGS: The cardiomediastinal silhouette is unchanged and enlarged in contour. No pleural effusion. No pneumothorax. Peribronchial cuffing with diffuse interstitial prominence the LEFT perihilar vascular fullness. Gaseous distension of colon. Spinal stimulator. IMPRESSION: Constellation of findings are favored to reflect mild pulmonary edema. Electronically Signed   By: Valentino Saxon M.D.   On: 05/27/2022 13:58   CT LUMBAR SPINE W CONTRAST  Result Date: 05/26/2022 CLINICAL DATA:  Low back pain, infection suspected concern for epidural abscess EXAM: CT LUMBAR SPINE WITH CONTRAST TECHNIQUE: Multidetector CT imaging of the lumbar spine was performed with intravenous contrast administration. RADIATION DOSE REDUCTION: This exam was performed according to the departmental dose-optimization program which includes automated exposure control, adjustment of the mA and/or kV according to patient size and/or use of iterative reconstruction technique. CONTRAST:  147m OMNIPAQUE IOHEXOL 300 MG/ML  SOLN COMPARISON:  CT lumbar spine without contrast 05/22/2022 FINDINGS: Segmentation: 5 lumbar-type vertebral bodies. Pseudoarticulation of the elongated left L5 transverse process with the sacral ala, with degenerative changes in both the transverse process and sacrum in this location. Alignment: Trace retrolisthesis of L3 on L4 and L4 on L5. Mild dextrocurvature of the thoracolumbar junction and compensatory levocurvature. Vertebrae: Unchanged bilateral pars defects at L5 with  additional fracture involving the left pedicle. Vertebral body heights are otherwise largely preserved. Significant endplate degenerative sclerosis, which is asymmetric to the left at L1-L2 and to the right at L2-L5. Degenerative changes appear overall unchanged from both 05/22/2022 and 10/30/2021 CT abdomen pelvis. Paraspinal and other soft tissues: Redemonstrated infrarenal abdominal aortic aneurysm status post endovascular repair. The aneurysm sac measures up to 4.4 cm, unchanged when remeasured similarly. Cholelithiasis. Right extrarenal pelvis. No abnormal enhancement in the psoas muscles to suggest abscess. No enhancing epidural collection. Disc levels: T11-T12: Disc height loss. Moderate facet arthropathy. Moderate spinal canal stenosis. Severe right and moderate left neural foraminal narrowing. T12-L1: Disc height loss. Moderate disc bulge. Mild facet arthropathy. Moderate spinal canal stenosis. Mild bilateral neural foraminal narrowing. L1-L2: Disc height loss and moderate disc bulge. Mild facet arthropathy. Ligamentum flavum hypertrophy. Severe spinal canal stenosis. Severe left and moderate right neural foraminal narrowing. L2-L3: Disc height loss with disc osteophyte complex. Moderate right and mild left facet arthropathy. Ligamentum flavum hypertrophy. Severe spinal canal stenosis. No significant neural foraminal narrowing. L3-L4: Trace retrolisthesis. Disc height loss and moderate disc bulge. Moderate facet arthropathy. Severe spinal canal stenosis. Severe right neural foraminal narrowing. L4-L5: Trace retrolisthesis and mild disc bulge. Severe facet arthropathy. Moderate spinal canal stenosis. Moderate bilateral neural foraminal narrowing. L5-S1: Disc height loss and mild disc bulge. Moderate to severe facet arthropathy. Fracture fragments project into the posterior aspect of the spinal canal posterior to the L5 vertebral body. No spinal canal stenosis at the disc level. Moderate right neural  foraminal narrowing. IMPRESSION: 1. No evidence of discitis or osteomyelitis. Unchanged endplate degenerative changes, without evidence of epidural enhancement or psoas abscess. If there is persistent concern for infection, this could be better evaluated with an MRI with and without contrast. 2. Otherwise unchanged severe degenerative changes throughout the lumbar spine, with severe spinal canal stenosis and severe neural foraminal narrowing at multiple levels. Electronically Signed   By: AMerilyn BabaM.D.   On: 05/26/2022 19:25   ECHOCARDIOGRAM COMPLETE  Result Date: 05/26/2022    ECHOCARDIOGRAM REPORT   Patient Name:   JLAMOUNT BANKSONDate of Exam: 05/26/2022 Medical Rec #:  [id number]      Height:       72.0 in Accession #:    [id number]     Weight:       270.0 lb Date of Birth:  09-20-1935      BSA:          2.420 m Patient Age:    41 years       BP:           155/79 mmHg Patient Gender: M              HR:           102 bpm. Exam Location:  [facility] Procedure: 2D Echo, Color Doppler and Cardiac Doppler Indications:     R78.81 Bacteremia  History:         Patient has no prior history of Echocardiogram examinations.                  CAD; Risk Factors:Hypertension, Dyslipidemia and Sleep Apnea.  Sonographer:     Charmayne Sheer Referring Phys:  [id number] Tsosie Billing Diagnosing Phys: Ida Rogue MD  Sonographer Comments: Suboptimal parasternal window. Image acquisition challenging due to patient body habitus and Image acquisition challenging due to respiratory motion. IMPRESSIONS  1. Left ventricular ejection fraction, by estimation, is 55 %. The left ventricle has normal function. The left ventricle has no regional wall motion abnormalities. Left ventricular diastolic parameters are consistent with Grade I diastolic dysfunction (impaired relaxation).  2. Right ventricular systolic function is normal. The right ventricular size is normal. Tricuspid regurgitation signal is inadequate for assessing PA pressure.   3. The mitral valve is normal in structure. No evidence of mitral valve regurgitation. No evidence of mitral stenosis.  4. The aortic valve is normal in structure. Aortic valve regurgitation is not visualized. Aortic valve sclerosis is present, with no evidence of aortic valve stenosis.  5. The inferior vena cava is normal in size with greater than 50% respiratory variability, suggesting right atrial pressure of 3 mmHg. FINDINGS  Left Ventricle: Left ventricular ejection fraction, by estimation, is 55 %. The left ventricle has normal function. The left ventricle has no regional wall motion abnormalities. The left ventricular internal cavity size was normal in size. There is no left ventricular hypertrophy. Left ventricular diastolic parameters are consistent with Grade I diastolic dysfunction (impaired relaxation). Right Ventricle: The right ventricular size is normal. No increase in right ventricular wall thickness. Right ventricular systolic function is normal. Tricuspid regurgitation signal is inadequate for assessing PA pressure. Left Atrium: Left atrial size was normal in size. Right Atrium: Right atrial size was normal in size. Pericardium: There is no evidence of pericardial effusion. Mitral Valve: The mitral valve is normal in structure. No evidence of mitral valve regurgitation. No evidence of mitral valve stenosis. There is no evidence of mitral valve vegetation. Tricuspid Valve: The tricuspid valve is normal in structure. Tricuspid valve regurgitation is not demonstrated. No evidence of tricuspid stenosis. There is no evidence of tricuspid valve vegetation. Aortic Valve: The aortic valve is normal in structure. Aortic valve regurgitation is not visualized. Aortic valve sclerosis is present, with no evidence of aortic valve stenosis. Aortic valve mean gradient measures 3.0 mmHg. Aortic valve peak gradient measures 6.6 mmHg. Aortic valve area, by VTI measures 3.41 cm. There is no evidence of aortic valve  vegetation. Pulmonic Valve: The pulmonic valve was normal in structure. Pulmonic valve regurgitation is not visualized. No evidence of pulmonic stenosis. There is no evidence of  pulmonic valve vegetation. Aorta: The aortic root is normal in size and structure. Venous: The inferior vena cava is normal in size with greater than 50% respiratory variability, suggesting right atrial pressure of 3 mmHg. IAS/Shunts: No atrial level shunt detected by color flow Doppler.  LEFT VENTRICLE PLAX 2D LVIDd:         5.60 cm   Diastology LVIDs:         3.90 cm   LV e' medial:    11.50 cm/s LV PW:         1.20 cm   LV E/e' medial:  6.6 LV IVS:        1.10 cm   LV e' lateral:   16.40 cm/s LVOT diam:     2.50 cm   LV E/e' lateral: 4.6 LV SV:         73 LV SV Index:   30 LVOT Area:     4.91 cm  RIGHT VENTRICLE RV Basal diam:  4.50 cm LEFT ATRIUM             Index        RIGHT ATRIUM           Index LA diam:        5.30 cm 2.19 cm/m   RA Area:     15.00 cm LA Vol (A2C):   33.9 ml 14.01 ml/m  RA Volume:   31.00 ml  12.81 ml/m LA Vol (A4C):   47.0 ml 19.42 ml/m LA Biplane Vol: 43.7 ml 18.05 ml/m  AORTIC VALVE                    PULMONIC VALVE AV Area (Vmax):    3.64 cm     PV Vmax:       0.57 m/s AV Area (Vmean):   3.88 cm     PV Peak grad:  1.3 mmHg AV Area (VTI):     3.41 cm AV Vmax:           128.00 cm/s AV Vmean:          85.000 cm/s AV VTI:            0.213 m AV Peak Grad:      6.6 mmHg AV Mean Grad:      3.0 mmHg LVOT Vmax:         94.90 cm/s LVOT Vmean:        67.100 cm/s LVOT VTI:          0.148 m LVOT/AV VTI ratio: 0.69  AORTA Ao Root diam: 3.50 cm MITRAL VALVE MV Area (PHT): 4.21 cm     SHUNTS MV Decel Time: 180 msec     Systemic VTI:  0.15 m MV E velocity: 75.50 cm/s   Systemic Diam: 2.50 cm MV A velocity: 105.00 cm/s MV E/A ratio:  0.72 Ida Rogue MD Electronically signed by Ida Rogue MD Signature Date/Time: 05/26/2022/4:51:37 PM    Final     Scheduled Meds:  amLODipine  5 mg Oral Daily   aspirin EC  81  mg Oral Daily   Chlorhexidine Gluconate Cloth  6 each Topical Q0600   enoxaparin (LOVENOX) injection  0.5 mg/kg Subcutaneous QHS   ezetimibe  10 mg Oral Daily   finasteride  5 mg Oral Daily   furosemide  40 mg Oral Daily   gabapentin  600 mg Oral QHS   levothyroxine  175 mcg Oral Q0600   linaclotide  290 mcg Oral  QAC breakfast   lisinopril  10 mg Oral Daily   tamsulosin  0.4 mg Oral Daily   Continuous Infusions:  nafcillin 12 g in sodium chloride 0.9 % 500 mL continuous infusion 12 g (05/27/22 1808)     LOS: 6 days    Time spent: 35 mins    Maudry Zeidan, MD [facility]   If 7PM-7AM, please contact night-coverage

## 2022-05-29 ENCOUNTER — Inpatient Hospital Stay: Payer: Medicare Other

## 2022-05-29 DIAGNOSIS — D72829 Elevated white blood cell count, unspecified: Secondary | ICD-10-CM | POA: Diagnosis not present

## 2022-05-29 DIAGNOSIS — A419 Sepsis, unspecified organism: Secondary | ICD-10-CM | POA: Diagnosis not present

## 2022-05-29 DIAGNOSIS — R7881 Bacteremia: Secondary | ICD-10-CM | POA: Diagnosis not present

## 2022-05-29 DIAGNOSIS — N179 Acute kidney failure, unspecified: Secondary | ICD-10-CM | POA: Diagnosis not present

## 2022-05-29 DIAGNOSIS — B957 Other staphylococcus as the cause of diseases classified elsewhere: Secondary | ICD-10-CM | POA: Diagnosis not present

## 2022-05-29 DIAGNOSIS — R6520 Severe sepsis without septic shock: Secondary | ICD-10-CM | POA: Diagnosis not present

## 2022-05-29 LAB — BASIC METABOLIC PANEL
Anion gap: 6 (ref 5–15)
BUN: 64 mg/dL — ABNORMAL HIGH (ref 8–23)
CO2: 26 mmol/L (ref 22–32)
Calcium: 9.1 mg/dL (ref 8.9–10.3)
Chloride: 107 mmol/L (ref 98–111)
Creatinine, Ser: 1.53 mg/dL — ABNORMAL HIGH (ref 0.61–1.24)
GFR, Estimated: 45 mL/min — ABNORMAL LOW (ref >60)
Glucose, Bld: 149 mg/dL — ABNORMAL HIGH (ref 70–99)
Potassium: 3.3 mmol/L — ABNORMAL LOW (ref 3.5–5.1)
Sodium: 139 mmol/L (ref 135–145)

## 2022-05-29 LAB — HEMOGLOBIN AND HEMATOCRIT, BLOOD
HCT: 24.2 % — ABNORMAL LOW (ref 39.0–52.0)
HCT: 25.9 % — ABNORMAL LOW (ref 39.0–52.0)
HCT: 26.3 % — ABNORMAL LOW (ref 39.0–52.0)
Hemoglobin: 7.9 g/dL — ABNORMAL LOW (ref 13.0–17.0)
Hemoglobin: 8.5 g/dL — ABNORMAL LOW (ref 13.0–17.0)
Hemoglobin: 8.4 g/dL — ABNORMAL LOW (ref 13.0–17.0)

## 2022-05-29 LAB — CBC
HCT: 23.4 % — ABNORMAL LOW (ref 39.0–52.0)
Hemoglobin: 7.7 g/dL — ABNORMAL LOW (ref 13.0–17.0)
MCH: 30.8 pg (ref 26.0–34.0)
MCHC: 32.9 g/dL (ref 30.0–36.0)
MCV: 93.6 fL (ref 80.0–100.0)
Platelets: 331 10*3/uL (ref 150–400)
RBC: 2.5 MIL/uL — ABNORMAL LOW (ref 4.22–5.81)
RDW: 15.5 % (ref 11.5–15.5)
WBC: 24.4 10*3/uL — ABNORMAL HIGH (ref 4.0–10.5)
nRBC: 0 % (ref 0.0–0.2)

## 2022-05-29 LAB — URINE CULTURE: Culture: NO GROWTH

## 2022-05-29 MED ORDER — PANTOPRAZOLE SODIUM 40 MG IV SOLR
40.0000 mg | Freq: Two times a day (BID) | INTRAVENOUS | Status: DC
  Administered 2022-05-29 – 2022-06-08 (×21): 40 mg via INTRAVENOUS
  Filled 2022-05-29 (×21): qty 10

## 2022-05-29 MED ORDER — POTASSIUM CHLORIDE 20 MEQ PO PACK
40.0000 meq | PACK | Freq: Once | ORAL | Status: AC
  Administered 2022-05-29: 40 meq via ORAL
  Filled 2022-05-29: qty 2

## 2022-05-29 MED ORDER — TECHNETIUM TC 99M-LABELED RED BLOOD CELLS IV KIT
21.1100 | PACK | Freq: Once | INTRAVENOUS | Status: AC | PRN
  Administered 2022-05-29: 21.11 via INTRAVENOUS

## 2022-05-29 MED ORDER — SODIUM CHLORIDE 0.9 % IV SOLN: INTRAVENOUS | Status: AC

## 2022-05-29 NOTE — Progress Notes (Signed)
OT Cancellation Note  Patient Details Name: Anthony [name] MRN: [id number] DOB: 09/28/1935   Cancelled Treatment:    Reason Eval/Treat Not Completed: Patient at procedure or test/ unavailable. Staff in with pt preparing him for transport. Will re-attempt OT tx at later date/time as pt is available and appropriate.   Ardeth Perfect., MPH, MS, OTR/L ascom [phone] 05/29/22, 2:29 PM

## 2022-05-29 NOTE — Consult Note (Addendum)
Pharmacy Antibiotic Note  Anthony [name] is a 86 y.o. male admitted on 05/22/2022 with bacteremia. PMH significant for AFlutter s/p ablation, chronic back pain s/p stent spine stimulator (currently malfunctioning), HTN, HLD, hypothyroidism, obesity, OSA on CPAP. Patient presenting after mechanical fall on 9/25 and found to have AKI with Scr 2.62 in ED with recovery to 2.14 after IV fluid administration (baseline 1.1-1.4). Of note, patient had recent lumbar epidural injection on 9/20 and is being followed by neuro. Patient with persistent leukocytosis despite treatment with nafcillin. Repeat UA on 9/30 with >50 WBC, >50 RBC.  Pharmacy consulted for Cefepime dosing.  Scr trending up, from 1.34 >> 1.53, but current cefepime dosing frequency remains appropriate.  Plan: Continue Cefepime 2g Q24H for UTI  Discontinue nafcillin Continue to monitor renal function and follow culture results   Temp (24hrs), Avg:98.4 F (36.9 C), Min:98 F (36.7 C), Max:98.8 F (37.1 C)  Recent Labs  Lab 05/22/22 1243 05/22/22 1530 05/23/22 0500 05/24/22 1300 05/25/22 0545 05/26/22 0713 05/27/22 0617 05/28/22 0620 05/29/22 0531  WBC  --   --    < >  --  19.9* 20.9* 25.6* 25.4* 24.4*  CREATININE  --   --    < >  --  1.42* 1.24 1.20 1.34* 1.53*  LATICACIDVEN 1.2 2.0*  --   --   --   --   --   --   --   VANCORANDOM  --   --   --  8  --   --   --   --   --    < > = values in this interval not displayed.     Estimated Creatinine Clearance: 48.6 mL/min (A) (by C-G formula based on SCr of 1.53 mg/dL (H)).    Allergies  Allergen Reactions   Questran [Cholestyramine]     Patient not aware of an allergy to this medicine.   Atorvastatin     Muscle pain   Gemfibrozil Other (See Comments)    Muscle pain.   Metformin And Related     Dizziness   Rosuvastatin     Muscle pain    Antimicrobials this admission: 9/25 Ceftriaxone 1g x1 in ED 9/26 Vancomycin 2000 mg x1  9/27 Daptomcyin 950 mg x1 9/28 Nafcillin >>  10/2 10/1 Cefepime >>  Dose adjustments this admission: N/A  Microbiology results: 9/25 BCx: 4 out of 4 bottles growing Staphylococcus capitis (pansensitive) 9/25 UCx: 20k Aerococcus spp.  9/29 BCx: NG2D 9/29 Ucx: collected  Thank you for allowing pharmacy to be a part of this patient's care.  Dara Hoyer, PharmD PGY-1 Pharmacy Resident 05/29/2022 6:35 AM

## 2022-05-29 NOTE — TOC Progression Note (Signed)
Transition of Care [facility]) - Progression Note    Patient Details  Name: Anthony [name] MRN: [id number] Date of Birth: 11-18-1935  [facility] CM/SW Kemp, LCSW Phone Number: 05/29/2022, 12:10 PM  Clinical Narrative:   Called son. He has not chosen a facility yet. He stated he will hopefully have decision by tomorrow.  Expected Discharge Plan: [place] Barriers to Discharge: SNF Pending bed offer, Continued Medical Work up  Expected Discharge Plan and Services Expected Discharge Plan: [place] Choice: [place] arrangements for the past 2 months: Single Family Home                                       Social Determinants of Health (SDOH) Interventions    Readmission Risk Interventions     No data to display

## 2022-05-29 NOTE — Progress Notes (Signed)
   05/29/22 0738  Vitals  Temp 97.9 F (36.6 C)  BP (!) 91/57  MAP (mmHg) 68  BP Location Left Arm  BP Method Automatic  Patient Position (if appropriate) Lying  Pulse Rate 77  Resp 15  MEWS COLOR  MEWS Score Color Green  Oxygen Therapy  SpO2 92 %  MEWS Score  MEWS Temp 0  MEWS Systolic 1  MEWS Pulse 0  MEWS RR 0  MEWS LOC 0  MEWS Score 1  Provider Notification  Provider Name/Title Dr. Dwyane Dee  Date Provider Notified 05/29/22  Time Provider Notified [phone]  Method of Notification Page  Notification Reason Other (Comment) (BP 91/57 (68). Has ordered lasix, lisinopril and norvasc. Give or hold medications.)  Provider response Other (Comment) (Please hold lasix, lisinopril and norvasc this morning.)  Date of Provider Response 05/29/22  Time of Provider Response [phone]

## 2022-05-29 NOTE — Progress Notes (Signed)
PROGRESS NOTE    Anthony [name]  TGG:[id number] DOB: 27-May-1936 DOA: 05/22/2022 PCP: Ria Bush, MD   Brief Narrative: This 86 yrs old male with medical history significant of atrial flutter,  status post ablation, chronic back pain status post stent spine stimulator which is malfunctioning, hypertension, hyperlipidemia, hypothyroidism, obesity, obstructive sleep apnea on CPAP comes to the emergency room with complaints of lower back pain radiating bilaterally to both legs,  going down along with increased urinary frequency.  Patient is found to have Staph aureus bacteremia, back pain is concerning for epidural abscess/early discitis.  Infectious diseases consulted recommended IV antibiotics.  Neurosurgery is consulted states no neurosurgical intervention needed, recommended IV steroids and IV antibiotics.  Echocardiogram no evidence of vegetation.  LVEF 70%, grade 1 diastolic dysfunction. CT lumbar spine with no evidence of abscess or discitis or osteomyelitis.  Patient continued to have leukocytosis despite being on nafcillin, UA with positive LE, started on cefepime.  Assessment & Plan:   Principal Problem:   Sepsis ([place]) Active Problems:   Coag negative Staphylococcus bacteremia  Severe sepsis POA:  Staph bacteremia: It could be UTI, back pain with possible discitis given recent procedure. He was found to have tachycardia , elevated white count of 21K, lactic acid of 2.0, increased creatinine to 2.6 from baseline 1.3. Lactic acid normalized, 1.2 > 2.0 Infectious disease Dr. Delaine Lame consulted.  Antibiotics changed to daptomycin. Urine culture 20 K of gram-positive cocci. Repeat blood cultures: No growth sofar, Antibiotics changed to nafcillin.  Echo with no evidence of vegetation, CT lumbar spine: no evidence of discitis, abscess or osteomyelitis. WBC count continues to trend up.  UA positive LE, chest x-ray unremarkable. Cefepime added for persistent leukocytosis. Discussed  with ID,  recommended to discontinue nafcillin and consider TEE.  Chronic back pain with severe degenerative arthritis: Chronic pain /history of spinal cord pain stimulator-- malfunctioning. Patient follows with pain clinic Dr. Zollie Scale as an outpatient, He recently underwent lumbar spinal steroid injection on 20 September 23 CRP 42.2, ESR 10.0 Neurosurgery consultation with Dr. Cari Caraway completed, given increase in weakness more on the left and right-- recommends brace and follow-up outpatient for malfunctioning pain stimulator. Unable to do MRI given stimulator, needs better imaging. Continue PRN PO and IV pain meds Continue muscle relaxants-- patient is on gabapentin. CT lumbar spine no evidence of discitis, abscess or osteomyelitis.   AKI on CKD stage IIIb:  Baseline creatinine 1.3 Presented  with creatinine of 2.62--2.18>2.25>1.42 > 1.24 >1.34>1.53 Continue IV fluids, monitor input output, monitor creatinine with metabolic panel. Avoid nephrotoxic agents AKI resolved but then has uptrend creatinine.   Leukocytosis: Likely reactive due to steroids versus to infection. --WBC 21K-18K-19.1- 20.9>25.6 Patient remained on daptomycin for staph bacteremia. Antibiotic changed to nafcillin. UA+ LE Added cefepime for gram-negative coverage. As per ID, nafcillin discontinued, continue cefepime for now.   Elevated troponin: Likely demand ischemia in the setting of sepsis. Patient denies any chest pain, dizziness, palpitations. He has hx.  of CAD with stent placed in LAD few years ago He has hx. of atrial flutter status post ablation in the past. Patient follows with [facility] cardiology Continue aspirin Plavix and statins.   Hypothyroidism Continue Synthroid.   Hyperlipidemia  Continue Zetia.  Hypokalemia: Replaced.  Continue to monitor    DVT prophylaxis: Lovenox Code Status: DNR Family Communication: No family at bedside Disposition Plan:   Status is: Inpatient Remains  inpatient appropriate because: Admitted for worsening lower back pain radiating towards the lower extremities.  Blood  cultures positive for Staph aureus , ID and Neurosurgery consulted,  started on IV daptomycin.  Antibiotic changed to IV nafcillin. Echocardiogram and CT myelogram completed, no evidence of vegetation or discitis or osteomyelitis or abscess.  ID recommended TEE.  PT recommended SNF.  Patient is not medically clear.   Consultants:  Neurosurgery Infectious diseases  Procedures: None Antimicrobials: Daptomycin, > nafcillin  Subjective: Patient was seen and examined at bedside.  Overnight events noted. Patient reports pain is reasonably controlled.  Renal functions are trending up. He was having breakfast, lying in the bed in sitting position.  Objective: Vitals:   05/28/22 1619 05/28/22 2134 05/29/22 0615 05/29/22 0738  BP: 116/65 (!) 118/55 97/66 (!) 91/57  Pulse: 99 87 80 77  Resp: '16 18 18 15  ' Temp: 98.8 F (37.1 C) 98.7 F (37.1 C) 98 F (36.7 C) 97.9 F (36.6 C)  TempSrc:      SpO2: 96% 97% 97% 92%    Intake/Output Summary (Last 24 hours) at 05/29/2022 1150 Last data filed at 05/29/2022 0554 Gross per 24 hour  Intake 892.08 ml  Output 2900 ml  Net -2007.92 ml   There were no vitals filed for this visit.  Examination:  General exam: Appears comfortable, morbidly obese, NAD, deconditioned. Respiratory system: CTA bilaterally, respiratory effort normal, RR 15 Cardiovascular system: S1 & S2 heard, regular rate and rhythm, no murmur. Gastrointestinal system: Abdomen is soft, non tender, non distended, BS+ Central nervous system: Alert and oriented x 2, no focal neurological deficits. Back: Tenderness noted in the lower back, restricted movements. Extremities: No edema, no cyanosis, no clubbing. Skin: No rashes, lesions or ulcers Psychiatry: Judgement and insight appear normal. Mood & affect appropriate.     Data Reviewed: I have personally reviewed  following labs and imaging studies  CBC: Recent Labs  Lab 05/25/22 0545 05/26/22 0713 05/27/22 0617 05/28/22 0620 05/29/22 0531 05/29/22 0937  WBC 19.9* 20.9* 25.6* 25.4* 24.4*  --   HGB 10.7* 10.7* 10.9* 9.7* 7.7* 7.9*  HCT 31.8* 31.2* 32.2* 29.6* 23.4* 24.2*  MCV 91.4 90.7 91.5 93.4 93.6  --   PLT 267 293 360 352 331  --    Basic Metabolic Panel: Recent Labs  Lab 05/25/22 0545 05/26/22 0713 05/27/22 0617 05/28/22 0620 05/29/22 0531  NA 141 139 144 142 139  K 3.5 3.3* 3.1* 3.2* 3.3*  CL 111 110 111 110 107  CO2 '23 25 27 26 26  ' GLUCOSE 171* 160* 173* 166* 149*  BUN 43* 35* 31* 50* 64*  CREATININE 1.42* 1.24 1.20 1.34* 1.53*  CALCIUM 9.9 9.8 9.9 9.4 9.1  MG 2.0  --   --   --   --   PHOS 2.9  --   --   --   --    GFR: Estimated Creatinine Clearance: 48.6 mL/min (A) (by C-G formula based on SCr of 1.53 mg/dL (H)). Liver Function Tests: Recent Labs  Lab 05/26/22 0713  AST 36  ALT 29  ALKPHOS 88  BILITOT 1.3*  PROT 6.7  ALBUMIN 2.7*   No results for input(s): "LIPASE", "AMYLASE" in the last 168 hours. No results for input(s): "AMMONIA" in the last 168 hours. Coagulation Profile: No results for input(s): "INR", "PROTIME" in the last 168 hours. Cardiac Enzymes: Recent Labs  Lab 05/24/22 1501  CKTOTAL 110   BNP (last 3 results) No results for input(s): "PROBNP" in the last 8760 hours. HbA1C: No results for input(s): "HGBA1C" in the last 72 hours. CBG: No results  for input(s): "GLUCAP" in the last 168 hours. Lipid Profile: No results for input(s): "CHOL", "HDL", "LDLCALC", "TRIG", "CHOLHDL", "LDLDIRECT" in the last 72 hours. Thyroid Function Tests: No results for input(s): "TSH", "T4TOTAL", "FREET4", "T3FREE", "THYROIDAB" in the last 72 hours. Anemia Panel: No results for input(s): "VITAMINB12", "FOLATE", "FERRITIN", "TIBC", "IRON", "RETICCTPCT" in the last 72 hours. Sepsis Labs: Recent Labs  Lab 05/22/22 1243 05/22/22 1530  LATICACIDVEN 1.2 2.0*     Recent Results (from the past 240 hour(s))  Blood culture (routine x 2)     Status: Abnormal   Collection Time: 05/22/22 12:43 PM   Specimen: Right Antecubital; Blood  Result Value Ref Range Status   Specimen Description   Final    RIGHT ANTECUBITAL Performed at [facility], [address], [place], [place] [address]    Special Requests   Final    BOTTLES DRAWN AEROBIC AND ANAEROBIC Blood Culture adequate volume Performed at [facility], [address] [place], [place] [address]    Culture  Setup Time   Final    GRAM POSITIVE COCCI IN BOTH AEROBIC AND ANAEROBIC BOTTLES CRITICAL RESULT CALLED TO, READ BACK BY AND VERIFIED WITH: [facility] HALLHAJI 05/23/22 5361 MW Performed at [facility], [place] [address], [place], [place] [address]    Culture STAPHYLOCOCCUS CAPITIS (A)  Final   Report Status 05/25/2022 FINAL  Final   Organism ID, Bacteria STAPHYLOCOCCUS CAPITIS  Final      Susceptibility   Staphylococcus capitis - MIC*    CIPROFLOXACIN <=0.5 SENSITIVE Sensitive     ERYTHROMYCIN <=0.25 SENSITIVE Sensitive     GENTAMICIN <=0.5 SENSITIVE Sensitive     OXACILLIN <=0.25 SENSITIVE Sensitive     TETRACYCLINE <=1 SENSITIVE Sensitive     VANCOMYCIN 1 SENSITIVE Sensitive     TRIMETH/SULFA <=10 SENSITIVE Sensitive     CLINDAMYCIN <=0.25 SENSITIVE Sensitive     RIFAMPIN <=0.5 SENSITIVE Sensitive     Inducible Clindamycin NEGATIVE Sensitive     * STAPHYLOCOCCUS CAPITIS  Blood Culture ID Panel (Reflexed)     Status: Abnormal   Collection Time: 05/22/22 12:43 PM  Result Value Ref Range Status   Enterococcus faecalis NOT DETECTED NOT DETECTED Final   Enterococcus Faecium NOT DETECTED NOT DETECTED Final   Listeria monocytogenes NOT DETECTED NOT DETECTED Final   Staphylococcus species DETECTED (A) NOT DETECTED Final    Comment: CRITICAL RESULT CALLED TO, READ BACK BY AND VERIFIED WITH: SHEMA HALLHAJI 05/23/22 0906 MW    Staphylococcus aureus (BCID) NOT DETECTED  NOT DETECTED Final   Staphylococcus epidermidis NOT DETECTED NOT DETECTED Final   Staphylococcus lugdunensis NOT DETECTED NOT DETECTED Final   Streptococcus species NOT DETECTED NOT DETECTED Final   Streptococcus agalactiae NOT DETECTED NOT DETECTED Final   Streptococcus pneumoniae NOT DETECTED NOT DETECTED Final   Streptococcus pyogenes NOT DETECTED NOT DETECTED Final   A.calcoaceticus-baumannii NOT DETECTED NOT DETECTED Final   Bacteroides fragilis NOT DETECTED NOT DETECTED Final   Enterobacterales NOT DETECTED NOT DETECTED Final   Enterobacter cloacae complex NOT DETECTED NOT DETECTED Final   Escherichia coli NOT DETECTED NOT DETECTED Final   Klebsiella aerogenes NOT DETECTED NOT DETECTED Final   Klebsiella oxytoca NOT DETECTED NOT DETECTED Final   Klebsiella pneumoniae NOT DETECTED NOT DETECTED Final   Proteus species NOT DETECTED NOT DETECTED Final   Salmonella species NOT DETECTED NOT DETECTED Final   Serratia marcescens NOT DETECTED NOT DETECTED Final   Haemophilus influenzae NOT DETECTED NOT DETECTED Final  Neisseria meningitidis NOT DETECTED NOT DETECTED Final   Pseudomonas aeruginosa NOT DETECTED NOT DETECTED Final   Stenotrophomonas maltophilia NOT DETECTED NOT DETECTED Final   Candida albicans NOT DETECTED NOT DETECTED Final   Candida auris NOT DETECTED NOT DETECTED Final   Candida glabrata NOT DETECTED NOT DETECTED Final   Candida krusei NOT DETECTED NOT DETECTED Final   Candida parapsilosis NOT DETECTED NOT DETECTED Final   Candida tropicalis NOT DETECTED NOT DETECTED Final   Cryptococcus neoformans/gattii NOT DETECTED NOT DETECTED Final    Comment: Performed at [facility], [address] [place], [place] [address]  Blood culture (routine x 2)     Status: Abnormal   Collection Time: 05/22/22 12:48 PM   Specimen: Left Antecubital; Blood  Result Value Ref Range Status   Specimen Description   Final    LEFT ANTECUBITAL Performed at [facility],  [address], [place], [place] [address]    Special Requests   Final    BOTTLES DRAWN AEROBIC AND ANAEROBIC Blood Culture adequate volume Performed at [facility], [address] [place], [place] [address]    Culture  Setup Time   Final    GRAM POSITIVE COCCI IN BOTH AEROBIC AND ANAEROBIC BOTTLES CRITICAL RESULT CALLED TO, READ BACK BY AND VERIFIED WITH: [facility] HALLHAJI 05/23/22 4765 MW Performed at [facility], [address] [place], [place] [address]    Culture (A)  Final    STAPHYLOCOCCUS CAPITIS SUSCEPTIBILITIES PERFORMED ON PREVIOUS CULTURE WITHIN THE LAST 5 DAYS. Performed at [facility], [place] [address], [place], [place] [address]    Report Status 05/25/2022 FINAL  Final  Urine Culture     Status: Abnormal   Collection Time: 05/22/22  3:28 PM   Specimen: Urine, Clean Catch  Result Value Ref Range Status   Specimen Description   Final    URINE, CLEAN CATCH Performed at [facility], [address] [place], [place] [address]    Special Requests   Final    NONE Performed at [facility], [address] [place], [place] [address]    Culture (A)  Final    20,000 COLONIES/mL AEROCOCCUS SPECIES Standardized susceptibility testing for this organism is not available. Performed at [facility], [place] [address] [place], [place] [address]    Report Status 05/24/2022 FINAL  Final  Culture, blood (Routine X 2) w Reflex to ID Panel     Status: None (Preliminary result)   Collection Time: 05/26/22  8:08 AM   Specimen: BLOOD  Result Value Ref Range Status   Specimen Description BLOOD BLOOD LEFT HAND  Final   Special Requests   Final    BOTTLES DRAWN AEROBIC AND ANAEROBIC Blood Culture adequate volume   Culture   Final    NO GROWTH 2 DAYS Performed at [facility], [address] [place], [place] [address]    Report Status PENDING  Incomplete  Culture, blood (Routine X 2) w Reflex to ID Panel     Status: None  (Preliminary result)   Collection Time: 05/26/22  8:08 AM   Specimen: BLOOD  Result Value Ref Range Status   Specimen Description BLOOD BLOOD LEFT ARM  Final   Special Requests   Final    BOTTLES DRAWN AEROBIC AND ANAEROBIC Blood Culture adequate volume   Culture   Final    NO GROWTH 2 DAYS Performed at [facility], [address] [place],  [address]  Report Status PENDING  Incomplete    Radiology Studies: DG Chest Port 1 View  Result Date: 05/27/2022 CLINICAL DATA:  [id number] leukocytosis EXAM: PORTABLE CHEST 1 VIEW COMPARISON:  October 30, 2021 FINDINGS: The cardiomediastinal silhouette is unchanged and enlarged in contour. No pleural effusion. No pneumothorax. Peribronchial cuffing with diffuse interstitial prominence the LEFT perihilar vascular fullness. Gaseous distension of colon. Spinal stimulator. IMPRESSION: Constellation of findings are favored to reflect mild pulmonary edema. Electronically Signed   By: Valentino Saxon M.D.   On: 05/27/2022 13:58    Scheduled Meds:  amLODipine  5 mg Oral Daily   aspirin EC  81 mg Oral Daily   Chlorhexidine Gluconate Cloth  6 each Topical Q0600   enoxaparin (LOVENOX) injection  0.5 mg/kg Subcutaneous QHS   ezetimibe  10 mg Oral Daily   finasteride  5 mg Oral Daily   furosemide  40 mg Oral Daily   gabapentin  600 mg Oral QHS   levothyroxine  175 mcg Oral Q0600   linaclotide  290 mcg Oral QAC breakfast   lisinopril  10 mg Oral Daily   tamsulosin  0.4 mg Oral Daily   Continuous Infusions:  sodium chloride 50 mL/hr at 05/29/22 0851   ceFEPime (MAXIPIME) IV 200 mL/hr at 05/28/22 1751     LOS: 7 days    Time spent: 35 mins    Audriana Aldama, MD [facility]   If 7PM-7AM, please contact night-coverage

## 2022-05-29 NOTE — Progress Notes (Signed)
PT Cancellation Note  Patient Details Name: Anthony [name] MRN: [id number] DOB: Dec 04, 1935   Cancelled Treatment:    Reason Eval/Treat Not Completed: Patient at procedure or test/unavailable Staff in with pt prepping for testing in nuclear med.  Will reschedule for tomorrow.   Chesley Noon 05/29/2022, 2:29 PM

## 2022-05-29 NOTE — Plan of Care (Signed)
  Problem: Clinical Measurements: Goal: Ability to maintain clinical measurements within normal limits will improve Outcome: Progressing   

## 2022-05-29 NOTE — Progress Notes (Signed)
   Date of Admission:  05/22/2022     ID: Anthony [name] is a 86 y.o. male  Principal Problem:   Sepsis ([place]) Active Problems:   Coag negative Staphylococcus bacteremia    Subjective: C/o black stool Pain back better controlled he says when he is lying still unless   Medications:   amLODipine  5 mg Oral Daily   aspirin EC  81 mg Oral Daily   Chlorhexidine Gluconate Cloth  6 each Topical Q0600   enoxaparin (LOVENOX) injection  0.5 mg/kg Subcutaneous QHS   ezetimibe  10 mg Oral Daily   finasteride  5 mg Oral Daily   furosemide  40 mg Oral Daily   gabapentin  600 mg Oral QHS   levothyroxine  175 mcg Oral Q0600   linaclotide  290 mcg Oral QAC breakfast   lisinopril  10 mg Oral Daily   tamsulosin  0.4 mg Oral Daily    Objective: Vital signs in last 24 hours: Temp:  [97.9 F (36.6 C)-98.8 F (37.1 C)] 97.9 F (36.6 C) (10/02 0738) Pulse Rate:  [77-99] 77 (10/02 0738) Resp:  [15-18] 15 (10/02 0738) BP: (91-118)/(55-66) 91/57 (10/02 0738) SpO2:  [92 %-97 %] 92 % (10/02 0738)    PHYSICAL EXAM:  General:awake, Alert Comfortable at rest Lungs: b/l air enty. Heart: irregular- well controlled. Abdomen: Soft, non-tender,not distended. Bowel sounds normal. No masses Extremities: atraumatic, no cyanosis. No edema. No clubbing Skin: No rashes or lesions. Or bruising Lymph: Cervical, supraclavicular normal. Neurologic: left lower extremity weakness foley Lab Results Recent Labs    05/28/22 0620 05/29/22 0531 05/29/22 0937  WBC 25.4* 24.4*  --   HGB 9.7* 7.7* 7.9*  HCT 29.6* 23.4* 24.2*  NA 142 139  --   K 3.2* 3.3*  --   CL 110 107  --   CO2 26 26  --   BUN 50* 64*  --   CREATININE 1.34* 1.53*  --    Microbiology: 05/22/22 Staph capitis bacteremia 4/4 9/29 BC-   Assessment/Plan: Low back pain with radiation down both legs Left leg more weak than right Has underlying degenerative spine disease and after a recent LESI the pain is worse Leucocytosis Staph   capitis bacteremia Concern for early discitis/epidural abscess- repeat CT with contrast no signs of infection Persistent ;leucocytosis   repeat blood culture- NG 2 d echo>no vegetation ? Tee. May not be needed if we decide to treat with 6 weeks of IV antibiotic On Nafcillin- will DC while on cefepime Pt has spine stimulator and that prevents from getting MRI    AKI on CKD- combination of infection, NSAID use, dehydration and BPH causing retention post void bladder scan had 900cc of urine and staright cath was 1600cc Has foley cath Had some LUTS- urine culture has been sent Also started on cefepime Will DC nafcillin while on cefepime  GI bleed Leucocytosis could be secondary to that- awaiting NM scan   Hypothyrodism on synthorid   CAD s/p stent B/l TKA   Discussed the management with patient and his son

## 2022-05-29 NOTE — Consult Note (Signed)
Anthony [name] , MD [address], [place], [place], [place], [address] [id number] [place], [place], [place], [address] Phone: [phone]  Fax: [phone]  Consultation  Referring Provider:   Dr. Dwyane Dee Primary Care Physician:  Ria Bush, MD Primary Gastroenterologist:  Dr. Bonna Gains Reason for Consultation: GI bleed  Date of Admission:  05/22/2022 Date of Consultation:  05/30/2022         HPI:   Anthony [name] is a 86 y.o. male with a history of atrial flutter s/p ablation chronic back pain obstructive sleep apnea admitted with back pain radiating to both legs found to have Staph aureus bacteremia on antibiotics.  Being treated for staph bacteremia had been on Plavix and statins for atrial flutter.  The patient had 2 large black stools hemoglobin dropped from 10.9 g to 7.9 g.  Blood pressure was slightly low and hence I was consulted.  I came to see him last evening but he was in nuclear imaging waiting for his stat RBC scan hence could not speak to the patient return back on 05/30/2022 8:30 AM.  He said that he has had some black stools but was not able to quantify or tell us for how long.  He was unaware if he has had a black stool this morning.  Denies any hematemesis.  He was unsure when he took his last dose of Plavix.  Check with nursing it was taken at least 5 days prior.  He does recollect taking NSAIDs in the past but cannot recollect when or how many.  Denies any complaints presently.  Past Medical History:  Diagnosis Date   Anosmia 1980s   Atrial flutter ([place]) 2018   had an ablation with dr. Erick Alley   Basal cell carcinoma 01/11/2022   L cheek - excised 04/25/22   CAD (coronary artery disease)    Cancer (HCC)    skin cancer   Chronic constipation    Chronic insomnia    Chronic lower back pain    s/p spine stimulator placement   Complication of anesthesia    when under general, he woke up agitated and unable to be held down   Diastolic CHF ([place])    History  of diverticulitis 2017   History of pneumonia 2013   Hyperlipidemia    Hypertension    Hypothyroidism    Obesity, Class II, BMI 35-39.9, with comorbidity    OSA on CPAP    PONV (postoperative nausea and vomiting)     Past Surgical History:  Procedure Laterality Date   A-FLUTTER ABLATION N/A 05/07/2017   Procedure: A-Flutter Ablation;  Surgeon: Deboraha Sprang, MD;  Location: [facility];  Service: Cardiovascular;  Laterality: N/A;   CARDIOVASCULAR STRESS TEST  11/2017   no ischemia, low risk study   COLONOSCOPY  2007   normal per prior PCP records, rpt 10 yrs (Dr Osie Cheeks)   [place] Right    ulnar nerve decompression.  PT DOES NOT RECALL THIS PROCEDURE   ENDOVASCULAR REPAIR/STENT GRAFT N/A 05/05/2020   Procedure: ENDOVASCULAR REPAIR/STENT GRAFT;  Surgeon: Algernon Huxley, MD;  Location: [facility];  Service: Cardiovascular;  Laterality: N/A;   LAMINECTOMY THORACIC SPINE W/ PLACEMENT SPINAL CORD STIMULATOR  08/2018   and removal of prior spine stimulator (Dr Lacinda Axon)   NASAL SINUS SURGERY     nasal polyps. done a long time ago   [place] (PCI-S)  2013   EF55%, 70% mid LAD, 99% mid  RCA, mild MR, elev LVEDP, DES to mid LAD. RCA is nondominant   RADIOFREQUENCY ABLATION  06/2017   lumbar region [facility])    REPLACEMENT TOTAL KNEE BILATERAL Bilateral 2000s   SPINAL CORD STIMULATOR IMPLANT  06/2016   SPINAL CORD STIMULATOR INSERTION N/A 09/09/2018   Procedure: LUMBAR SPINAL CORD STIMULATOR LEAD AND BATTERY REMOVAL AND LAMINECTOMY FOR PLACEMENT OF PADDLE;  Surgeon: Deetta Perla, MD;  Location: [facility];  Service: Neurosurgery;  Laterality: N/A;   SPINAL CORD STIMULATOR INSERTION N/A 09/16/2018   Procedure: PLACEMENT OF SPINAL CORD STIMULATOR BATTERY;  Surgeon: Deetta Perla, MD;  Location: [facility];  Service: Neurosurgery;  Laterality: N/A;    Prior to Admission medications   Medication Sig Start Date End Date Taking? Authorizing Provider   acetaminophen (TYLENOL) 500 MG tablet Take 1 tablet (500 mg total) by mouth 2 (two) times daily as needed for moderate pain. 11/06/19  Yes Ria Bush, MD  amLODipine (NORVASC) 5 MG tablet TAKE 1 TABLET(5 MG) BY MOUTH DAILY Patient taking differently: Take 5 mg by mouth at bedtime. TAKE 1 TABLET(5 MG) BY MOUTH DAILY 04/11/22  Yes Ria Bush, MD  aspirin EC 81 MG tablet Take 1 tablet (81 mg total) by mouth daily. 11/05/18  Yes Ria Bush, MD  clopidogrel (PLAVIX) 75 MG tablet TAKE 1 TABLET(75 MG) BY MOUTH DAILY AT 6 AM 04/27/21  Yes Kris Hartmann, NP  diphenhydramine-acetaminophen (TYLENOL PM) 25-500 MG TABS tablet Take 2 tablets by mouth at bedtime.   Yes [provider]  docusate sodium (COLACE) 100 MG capsule Take 100 mg by mouth daily.   Yes [provider]  Eszopiclone 3 MG TABS Take 1 tablet (3 mg total) by mouth at bedtime as needed. Use sparingly, take immediately before bedtime 09/12/21  Yes Ria Bush, MD  ezetimibe (ZETIA) 10 MG tablet Take 1 tablet (10 mg total) by mouth daily. 12/28/21  Yes Ria Bush, MD  finasteride (PROSCAR) 5 MG tablet TAKE 1 TABLET(5 MG) BY MOUTH DAILY 03/29/22  Yes Ria Bush, MD  furosemide (LASIX) 40 MG tablet TAKE 1 TABLET BY MOUTH EVERY DAY. TAKE AN EXTRA TABLET AS NEEDED FOR WEIGHT GAIN OF 3 LBS/DAY OR 5LBS/WEEK 10/19/21  Yes Ria Bush, MD  gabapentin (NEURONTIN) 300 MG capsule TAKE 2 CAPSULE BY MOUTH AT BEDTIME WITH EXTRA CAPSULE DURING THE DAY AS NEEDED 04/18/22  Yes Ria Bush, MD  icosapent Ethyl (VASCEPA) 1 g capsule Take 2 capsules (2 g total) by mouth 2 (two) times daily. 09/12/21  Yes Deboraha Sprang, MD  levothyroxine (SYNTHROID) 175 MCG tablet Take 1 tablet (175 mcg total) by mouth daily before breakfast. 05/02/22  Yes Ria Bush, MD  LINZESS 290 MCG CAPS capsule TAKE 1 CAPSULE(290 MCG) BY MOUTH DAILY BEFORE BREAKFAST 04/18/22  Yes Ria Bush, MD  lisinopril (ZESTRIL) 10 MG  tablet TAKE 1 TABLET(10 MG) BY MOUTH DAILY 12/28/21  Yes Ria Bush, MD  Melatonin 10 MG CAPS Take 10 mg by mouth at bedtime.   Yes [provider]  meloxicam (MOBIC) 7.5 MG tablet Take 1 tablet (7.5 mg total) by mouth daily. 05/02/22  Yes Ria Bush, MD  Multiple Vitamins-Minerals (CENTRUM SILVER PO) Take 1 tablet by mouth daily.   Yes [provider]  Multiple Vitamins-Minerals (PRESERVISION AREDS 2 PO) Take 1 tablet by mouth daily.   Yes [provider]  naproxen sodium (ALEVE) 220 MG tablet Take 220 mg by mouth daily as needed.   Yes [provider]  oxyCODONE-acetaminophen (PERCOCET) 7.5-325  MG tablet Take 1 tablet by mouth 3 (three) times daily as needed for severe pain. 05/02/22  Yes Ria Bush, MD  tamsulosin (FLOMAX) 0.4 MG CAPS capsule Take 1 capsule (0.4 mg total) by mouth daily. 08/30/21  Yes Ria Bush, MD  Alirocumab (PRALUENT) 75 MG/ML SOAJ Inject 1 pen. into the skin every 14 (fourteen) days. 01/10/22   Deboraha Sprang, MD  Cholecalciferol (VITAMIN D3) 1000 units CAPS Take 1 capsule (1,000 Units total) by mouth daily. Patient not taking: Reported on 05/22/2022 05/22/18   Ria Bush, MD  ciclopirox (PENLAC) 8 % solution APPLY TOPICALLY AT BEDTIME APPLY OVER NAIL AND SURROUNDING SKIN, APPLY DAILY OVER PREVIOUS COAT, AFTER 7 DAYS MAY REMOVE WITH ALCOHOL AND REPEAT 02/06/22   Ralene Bathe, MD  fluocinonide (LIDEX) 0.05 % external solution Apply 1 application topically 2 (two) times daily as needed. Patient taking differently: Apply 1 application  topically daily as needed (irritation). 03/11/20   Ralene Bathe, MD  ketoconazole (NIZORAL) 2 % cream APPLY TO FEET AND FACE AT BEDTIME AS DIRECTED 09/19/21   Ralene Bathe, MD  ketoconazole (NIZORAL) 2 % shampoo APPLY TOPICALLY 3 TIMES WEEKLY AS DIRECTED 02/06/22   Ralene Bathe, MD  mometasone (ELOCON) 0.1 % cream APPLY TOPICALLY TO THE AFFECTED AREA DAILY AS NEEDED FOR  RASH 02/06/22   Ralene Bathe, MD  mometasone (ELOCON) 0.1 % lotion APPLY TO SCALP AT BEDTIME ON Laurine Blazer, AND FRIDAY 02/16/22   Ralene Bathe, MD  mupirocin ointment (BACTROBAN) 2 % Apply 1 Application topically daily. With dressing changes 04/25/22   Ralene Bathe, MD  nitroGLYCERIN (NITROSTAT) 0.4 MG SL tablet Place 1 tablet (0.4 mg total) under the tongue every 5 (five) minutes as needed for chest pain. 12/01/17   Ria Bush, MD  ondansetron (ZOFRAN) 4 MG tablet Take 1 tablet (4 mg total) by mouth every 6 (six) hours as needed for nausea. Patient not taking: Reported on 05/22/2022 10/31/21   Lorella Nimrod, MD  senna (SENOKOT) 8.6 MG tablet Take 1 tablet (8.6 mg total) by mouth daily. 04/06/21   Ria Bush, MD  tiZANidine (ZANAFLEX) 2 MG tablet TAKE 1 TO 2 TABLETS(2 TO 4 MG) BY MOUTH TWICE DAILY AS NEEDED FOR MUSCLE SPASMS Patient not taking: Reported on 05/22/2022 02/08/22   Ria Bush, MD  traZODone (DESYREL) 50 MG tablet Take 1 tablet (50 mg total) by mouth at bedtime as needed for sleep. Patient not taking: Reported on 05/22/2022 05/18/22   Ria Bush, MD    Family History  Problem Relation Age of Onset   Stroke Mother    Kidney disease Father    Leukemia Brother    Stroke Sister    Diabetes Neg Hx      Social History   Tobacco Use   Smoking status: Never    Passive exposure: Never   Smokeless tobacco: Never  Vaping Use   Vaping Use: Never used  Substance Use Topics   Alcohol use: No    Comment: QUIT DRINKING   Drug use: No    Allergies as of 05/22/2022 - Review Complete 05/22/2022  Allergen Reaction Noted   Questran [cholestyramine]  04/09/2017   Atorvastatin  04/09/2017   Gemfibrozil Other (See Comments) 04/09/2017   Metformin and related  04/09/2017   Rosuvastatin  04/09/2017    Review of Systems:    All systems reviewed and negative except where noted in HPI.   Physical Exam:  Vital signs in last 24 hours:  Temp:  [97.7  F (36.5 C)-98.8 F (37.1 C)] 98.8 F (37.1 C) (10/03 0836) Pulse Rate:  [67-83] 67 (10/03 0836) Resp:  [17-18] 18 (10/03 0836) BP: (98-142)/(62-78) 135/66 (10/03 0836) SpO2:  [94 %-98 %] 97 % (10/03 0836) Last BM Date : 05/29/22 General:   Pleasant, cooperative in NAD Head:  Normocephalic and atraumatic. Eyes:   No icterus.   Conjunctiva pink. PERRLA. Ears:  Normal auditory acuity. Neck:  Supple; no masses or thyroidomegaly Lungs: Respirations even and unlabored. Lungs clear to auscultation bilaterally.   No wheezes, crackles, or rhonchi.  Heart:  Regular rate and rhythm;  Without murmur, clicks, rubs or gallops Abdomen:  Soft, nondistended, nontender. Normal bowel sounds. No appreciable masses or hepatomegaly.  No rebound or guarding.  Neurologic:  Alert and oriented x3;  grossly normal neurologically. Psych:  Alert and cooperative. Normal affect.  LAB RESULTS: Recent Labs    05/28/22 0620 05/29/22 0531 05/29/22 0937 05/30/22 0117 05/30/22 0512 05/30/22 0719  WBC 25.4* 24.4*  --   --   --   --   HGB 9.7* 7.7*   < > 7.0* 7.0* 7.0*  HCT 29.6* 23.4*   < > 21.5* 21.5* 21.4*  PLT 352 331  --   --   --   --    < > = values in this interval not displayed.   BMET Recent Labs    05/28/22 0620 05/29/22 0531 05/30/22 0117  NA 142 139  --   K 3.2* 3.3*  --   CL 110 107  --   CO2 26 26  --   GLUCOSE 166* 149*  --   BUN 50* 64*  --   CREATININE 1.34* 1.53* 1.32*  CALCIUM 9.4 9.1  --    LFT No results for input(s): "PROT", "ALBUMIN", "AST", "ALT", "ALKPHOS", "BILITOT", "BILIDIR", "IBILI" in the last 72 hours. PT/INR No results for input(s): "LABPROT", "INR" in the last 72 hours.  STUDIES: NM GI Blood Loss  Result Date: 05/29/2022 CLINICAL DATA:  Dark bowel movements containing blood today, history of diverticulitis EXAM: NUCLEAR MEDICINE GASTROINTESTINAL BLEEDING SCAN TECHNIQUE: Sequential abdominal images were obtained following intravenous administration of Tc-27m labeled red blood cells. RADIOPHARMACEUTICALS:  21.11 mCi Tc-936mertechnetate in-vitro labeled red cells. COMPARISON:  CT abdomen and pelvis 05/22/2022 FINDINGS: Anterior planar images of the abdomen were obtained for 2 hours after the intravenous administration of radiotracer. Evaluation is slightly limited by patient motion throughout the exam. There are no areas of abnormal radiotracer accumulation or propagation to suggest active gastrointestinal bleeding. Minimal activity is seen within the kidneys and bladder. IMPRESSION: 1. No evidence of active gastrointestinal bleeding. Electronically Signed   By: MiRanda Ngo.D.   On: 05/29/2022 16:56      Impression / Plan:   JaEERO DINIs a 89.o. y/o male with initially admitted for Staph aureus bacteremia with severe sepsis from possible discitis/UTI.  On Plavix for atrial flutter have been consulted for 2 black bowel movements with drop in hemoglobin from 10.9 g to 8.5 g with elevation of the BUN/creatinine ratio. Negative tagged RBC scan.  History of NSAID use unclear when last taken, naproxen listed on his records.  Per nursing last dose of Plavix taken at least 5 days back.   Plan 1.  EGD today 2.  Hold Plavix, stop all NSAID use 3.  IV PPI   I have discussed alternative options, risks & benefits,  which include, but are not limited to, bleeding,  infection, perforation,respiratory complication & drug reaction.  The patient agrees with this plan & written consent will be obtained.     Thank you for involving me in the care of this patient.      LOS: 8 days   Anthony Bellows, MD  05/30/2022, 8:37 AM

## 2022-05-29 NOTE — Care Plan (Signed)
RN reports that patient has 2 large black bowel movements, H/ H dropped from 10.9>9.7>7.7>7.9. Blood pressure slightly low.  Continue IV hydration.  Start Protonix 40 mg IV every 12 hours.  GI is consulted recommended RBC tagged scan.  Monitor H&H every 6 hours.

## 2022-05-30 ENCOUNTER — Inpatient Hospital Stay: Payer: Medicare Other | Admitting: Anesthesiology

## 2022-05-30 ENCOUNTER — Encounter
Admission: EM | Disposition: A | Discharge status: Skilled Nursing Facility | Payer: Self-pay | Source: Home / Self Care | Attending: Family Medicine

## 2022-05-30 ENCOUNTER — Encounter: Payer: Self-pay | Admitting: Internal Medicine

## 2022-05-30 DIAGNOSIS — R6520 Severe sepsis without septic shock: Secondary | ICD-10-CM | POA: Diagnosis not present

## 2022-05-30 DIAGNOSIS — D72829 Elevated white blood cell count, unspecified: Secondary | ICD-10-CM | POA: Diagnosis not present

## 2022-05-30 DIAGNOSIS — R7881 Bacteremia: Secondary | ICD-10-CM | POA: Diagnosis not present

## 2022-05-30 DIAGNOSIS — K921 Melena: Secondary | ICD-10-CM | POA: Diagnosis not present

## 2022-05-30 DIAGNOSIS — A419 Sepsis, unspecified organism: Secondary | ICD-10-CM | POA: Diagnosis not present

## 2022-05-30 DIAGNOSIS — B957 Other staphylococcus as the cause of diseases classified elsewhere: Secondary | ICD-10-CM | POA: Diagnosis not present

## 2022-05-30 DIAGNOSIS — I130 Hypertensive heart and chronic kidney disease with heart failure and stage 1 through stage 4 chronic kidney disease, or unspecified chronic kidney disease: Secondary | ICD-10-CM | POA: Diagnosis not present

## 2022-05-30 DIAGNOSIS — I5030 Unspecified diastolic (congestive) heart failure: Secondary | ICD-10-CM | POA: Diagnosis not present

## 2022-05-30 DIAGNOSIS — K274 Chronic or unspecified peptic ulcer, site unspecified, with hemorrhage: Secondary | ICD-10-CM | POA: Diagnosis not present

## 2022-05-30 DIAGNOSIS — N179 Acute kidney failure, unspecified: Secondary | ICD-10-CM | POA: Diagnosis not present

## 2022-05-30 DIAGNOSIS — N183 Chronic kidney disease, stage 3 unspecified: Secondary | ICD-10-CM | POA: Diagnosis not present

## 2022-05-30 HISTORY — PX: ESOPHAGOGASTRODUODENOSCOPY (EGD) WITH PROPOFOL: SHX5813

## 2022-05-30 LAB — HEMOGLOBIN AND HEMATOCRIT, BLOOD
HCT: 21.5 % — ABNORMAL LOW (ref 39.0–52.0)
HCT: 21.5 % — ABNORMAL LOW (ref 39.0–52.0)
HCT: 21.4 % — ABNORMAL LOW (ref 39.0–52.0)
HCT: 25.6 % — ABNORMAL LOW (ref 39.0–52.0)
Hemoglobin: 7 g/dL — ABNORMAL LOW (ref 13.0–17.0)
Hemoglobin: 7 g/dL — ABNORMAL LOW (ref 13.0–17.0)
Hemoglobin: 7 g/dL — ABNORMAL LOW (ref 13.0–17.0)
Hemoglobin: 8.3 g/dL — ABNORMAL LOW (ref 13.0–17.0)

## 2022-05-30 LAB — COMPREHENSIVE METABOLIC PANEL
ALT: 19 U/L (ref 0–44)
AST: 22 U/L (ref 15–41)
Albumin: 2 g/dL — ABNORMAL LOW (ref 3.5–5.0)
Alkaline Phosphatase: 50 U/L (ref 38–126)
Anion gap: 4 — ABNORMAL LOW (ref 5–15)
BUN: 41 mg/dL — ABNORMAL HIGH (ref 8–23)
CO2: 26 mmol/L (ref 22–32)
Calcium: 8.8 mg/dL — ABNORMAL LOW (ref 8.9–10.3)
Chloride: 105 mmol/L (ref 98–111)
Creatinine, Ser: 1.28 mg/dL — ABNORMAL HIGH (ref 0.61–1.24)
GFR, Estimated: 55 mL/min — ABNORMAL LOW (ref >60)
Glucose, Bld: 163 mg/dL — ABNORMAL HIGH (ref 70–99)
Potassium: 3.6 mmol/L (ref 3.5–5.1)
Sodium: 135 mmol/L (ref 135–145)
Total Bilirubin: 0.6 mg/dL (ref 0.3–1.2)
Total Protein: 5.4 g/dL — ABNORMAL LOW (ref 6.5–8.1)

## 2022-05-30 LAB — CREATININE, SERUM
Creatinine, Ser: 1.32 mg/dL — ABNORMAL HIGH (ref 0.61–1.24)
GFR, Estimated: 53 mL/min — ABNORMAL LOW (ref >60)

## 2022-05-30 LAB — PREPARE RBC (CROSSMATCH)

## 2022-05-30 SURGERY — ESOPHAGOGASTRODUODENOSCOPY (EGD) WITH PROPOFOL
Anesthesia: General

## 2022-05-30 MED ORDER — SODIUM CHLORIDE 0.9 % IV SOLN: INTRAVENOUS | Status: DC

## 2022-05-30 MED ORDER — PROPOFOL 10 MG/ML IV BOLUS
INTRAVENOUS | Status: DC | PRN
  Administered 2022-05-30: 80 mg via INTRAVENOUS

## 2022-05-30 MED ORDER — LIDOCAINE HCL (CARDIAC) PF 100 MG/5ML IV SOSY
PREFILLED_SYRINGE | INTRAVENOUS | Status: DC | PRN
  Administered 2022-05-30: 100 mg via INTRAVENOUS

## 2022-05-30 MED ORDER — SODIUM CHLORIDE 0.9% IV SOLUTION: Freq: Once | INTRAVENOUS | Status: AC

## 2022-05-30 MED ORDER — CEFAZOLIN SODIUM-DEXTROSE 2-4 GM/100ML-% IV SOLN
2.0000 g | Freq: Three times a day (TID) | INTRAVENOUS | Status: DC
  Administered 2022-05-30 – 2022-06-08 (×28): 2 g via INTRAVENOUS
  Filled 2022-05-30 (×28): qty 100

## 2022-05-30 NOTE — Progress Notes (Signed)
Date of Admission:  05/22/2022   Total days of antibiotics ***        Day ***        Day ***        Day ***   ID: Anthony [name] is a 86 y.o. male with  *** Principal Problem:   Sepsis ([place]) Active Problems:   Coag negative Staphylococcus bacteremia   Gastrointestinal hemorrhage with melena    Subjective: ***  Medications:   amLODipine  5 mg Oral Daily   Chlorhexidine Gluconate Cloth  6 each Topical Q0600   ezetimibe  10 mg Oral Daily   finasteride  5 mg Oral Daily   furosemide  40 mg Oral Daily   gabapentin  600 mg Oral QHS   levothyroxine  175 mcg Oral Q0600   linaclotide  290 mcg Oral QAC breakfast   pantoprazole (PROTONIX) IV  40 mg Intravenous Q12H   tamsulosin  0.4 mg Oral Daily    Objective: Vital signs in last 24 hours: Temp:  [97.5 F (36.4 C)-99 F (37.2 C)] 99 F (37.2 C) (10/03 1549) Pulse Rate:  [67-92] 92 (10/03 1549) Resp:  [16-19] 16 (10/03 1549) BP: (114-143)/(63-86) 142/63 (10/03 1549) SpO2:  [91 %-99 %] 91 % (10/03 1549) Weight:  [122.5 kg] 122.5 kg (10/03 1028)  LDA Foley Central lines Other catheters  PHYSICAL EXAM:  General: Alert, cooperative, no distress, appears stated age.  Head: Normocephalic, without obvious abnormality, atraumatic. Eyes: Conjunctivae clear, anicteric sclerae. Pupils are equal ENT Nares normal. No drainage or sinus tenderness. Lips, mucosa, and tongue normal. No Thrush Neck: Supple, symmetrical, no adenopathy, thyroid: non tender no carotid bruit and no JVD. Back: No CVA tenderness. Lungs: Clear to auscultation bilaterally. No Wheezing or Rhonchi. No rales. Heart: Regular rate and rhythm, no murmur, rub or gallop. Abdomen: Soft, non-tender,not distended. Bowel sounds normal. No masses Extremities: atraumatic, no cyanosis. No edema. No clubbing Skin: No rashes or lesions. Or bruising Lymph: Cervical, supraclavicular normal. Neurologic: Grossly non-focal  Lab Results Recent Labs    05/28/22 0620  05/29/22 0531 05/29/22 0937 05/30/22 0117 05/30/22 0512 05/30/22 0719 05/30/22 0830 05/30/22 1520  WBC 25.4* 24.4*  --   --   --   --   --   --   HGB 9.7* 7.7*   < > 7.0*   < > 7.0*  --  8.3*  HCT 29.6* 23.4*   < > 21.5*   < > 21.4*  --  25.6*  NA 142 139  --   --   --   --  135  --   K 3.2* 3.3*  --   --   --   --  3.6  --   CL 110 107  --   --   --   --  105  --   CO2 26 26  --   --   --   --  26  --   BUN 50* 64*  --   --   --   --  41*  --   CREATININE 1.34* 1.53*  --  1.32*  --   --  1.28*  --    < > = values in this interval not displayed.   Liver Panel Recent Labs    05/30/22 0830  PROT 5.4*  ALBUMIN 2.0*  AST 22  ALT 19  ALKPHOS 50  BILITOT 0.6   Sedimentation Rate No results for input(s): "ESRSEDRATE" in the last 72 hours.  C-Reactive Protein No results for input(s): "CRP" in the last 72 hours.  Microbiology:  Studies/Results: NM GI Blood Loss  Result Date: 05/29/2022 CLINICAL DATA:  Dark bowel movements containing blood today, history of diverticulitis EXAM: NUCLEAR MEDICINE GASTROINTESTINAL BLEEDING SCAN TECHNIQUE: Sequential abdominal images were obtained following intravenous administration of Tc-18mlabeled red blood cells. RADIOPHARMACEUTICALS:  21.11 mCi Tc-940mertechnetate in-vitro labeled red cells. COMPARISON:  CT abdomen and pelvis 05/22/2022 FINDINGS: Anterior planar images of the abdomen were obtained for 2 hours after the intravenous administration of radiotracer. Evaluation is slightly limited by patient motion throughout the exam. There are no areas of abnormal radiotracer accumulation or propagation to suggest active gastrointestinal bleeding. Minimal activity is seen within the kidneys and bladder. IMPRESSION: 1. No evidence of active gastrointestinal bleeding. Electronically Signed   By: MiRanda Ngo.D.   On: 05/29/2022 16:56     Assessment/Plan: ***

## 2022-05-30 NOTE — Progress Notes (Signed)
PT note  Pt off floor for procedure.  Will plan on resuming tomorrow as appropriate.

## 2022-05-30 NOTE — Op Note (Signed)
[facility] Gastroenterology Patient Name: Anthony [name] Procedure Date: 05/30/2022 10:51 AM MRN: [id number] Account #: [id number] Date of Birth: [birth year] Admit Type: Outpatient Age: 86 Room: [facility] ENDO ROOM 2 Gender: Male Note Status: Finalized Instrument Name: Upper Endoscope [id number] Procedure:             Upper GI endoscopy Indications:           Melena Providers:             Jonathon Bellows MD, MD Referring MD:          Ria Bush (Referring MD) Medicines:             Monitored Anesthesia Care Complications:         No immediate complications. Procedure:             Pre-Anesthesia Assessment:                        - Prior to the procedure, a History and Physical was                         performed, and patient medications, allergies and                         sensitivities were reviewed. The patient's tolerance                         of previous anesthesia was reviewed.                        - The risks and benefits of the procedure and the                         sedation options and risks were discussed with the                         patient. All questions were answered and informed                         consent was obtained.                        - ASA Grade Assessment: III - A patient with severe                         systemic disease.                        After obtaining informed consent, the endoscope was                         passed under direct vision. Throughout the procedure,                         the patient's blood pressure, pulse, and oxygen                         saturations were monitored continuously. The Endoscope                         was introduced through the  mouth, and advanced to the                         third part of duodenum. The upper GI endoscopy was                         accomplished with ease. The patient tolerated the                         procedure well. Findings:      The esophagus was normal.       A small amount of food (residue) was found on the greater curvature of       the stomach.      One non-bleeding superficial gastric ulcer with a clean ulcer base       (Forrest Class III) was found at the incisura. The lesion was 10 mm in       largest dimension.      One non-bleeding cratered duodenal ulcer with a clean ulcer base       (Forrest Class III) was found in the duodenal bulb. The lesion was 15 mm       in largest dimension.      One non-bleeding linear duodenal ulcer with a clean ulcer base (Forrest       Class III) was found in the third portion of the duodenum. The lesion       was 10 mm in largest dimension.      The cardia and gastric fundus were normal on retroflexion. Impression:            - Normal esophagus.                        - A small amount of food (residue) in the stomach.                        - Non-bleeding gastric ulcer with a clean ulcer base                         (Forrest Class III).                        - Non-bleeding duodenal ulcer with a clean ulcer base                         (Forrest Class III).                        - Non-bleeding duodenal ulcer with a clean ulcer base                         (Forrest Class III).                        - No specimens collected. Recommendation:        - Return patient to hospital ward for ongoing care.                        - Advance diet as tolerated.                        -  Prilosec 40 mg BID for 3 months                        No nsaid use                        If possible stay off plavix for 5-7 days - duodenal                         ulcer appears pretty deep                        check H pylori breath test as an outpatient                        Repeat EGd in 8-10 weeks to check for healing                        GI will sign out Procedure Code(s):     --- Professional ---                        [phone], Esophagogastroduodenoscopy, flexible,                         transoral; diagnostic,  including collection of                         specimen(s) by brushing or washing, when performed                         (separate procedure) Diagnosis Code(s):     --- Professional ---                        K25.9, Gastric ulcer, unspecified as acute or chronic,                         without hemorrhage or perforation                        K26.9, Duodenal ulcer, unspecified as acute or                         chronic, without hemorrhage or perforation                        K92.1, Melena (includes Hematochezia) CPT copyright 2019 American Medical Association. All rights reserved. The codes documented in this report are preliminary and upon coder review may  be revised to meet current compliance requirements. Jonathon Bellows, MD Jonathon Bellows MD, MD 05/30/2022 11:01:26 AM This report has been signed electronically. Number of Addenda: 0 Note Initiated On: 05/30/2022 10:51 AM Estimated Blood Loss:  Estimated blood loss: none.      [facility]

## 2022-05-30 NOTE — Transfer of Care (Signed)
Immediate Anesthesia Transfer of Care Note  [name]  Procedure(s) Performed: ESOPHAGOGASTRODUODENOSCOPY (EGD) WITH PROPOFOL  Patient Location: Endoscopy Unit  Anesthesia Type:General  Level of Consciousness: drowsy  Airway & Oxygen Therapy: Patient Spontanous Breathing and Patient connected to nasal cannula oxygen  Post-op Assessment: Report given to RN, Post -op Vital signs reviewed and stable and Patient moving all extremities  Post vital signs: Reviewed and stable  Last Vitals:  Vitals Value Taken Time  BP 126/70 05/30/22 1103  Temp 36.4 C 05/30/22 1103  Pulse 79 05/30/22 1107  Resp 18 05/30/22 1107  SpO2 95 % 05/30/22 1107  Vitals shown include unvalidated device data.  Last Pain:  Vitals:   05/30/22 1103  TempSrc: Tympanic  PainSc: Asleep         Complications: No notable events documented.

## 2022-05-30 NOTE — Anesthesia Preprocedure Evaluation (Signed)
Anesthesia Evaluation  Patient identified by MRN, date of birth, ID band Patient awake and Patient confused    Reviewed: Allergy & Precautions, NPO status , Patient's Chart, lab work & pertinent test results  History of Anesthesia Complications (+) PONV, Emergence Delirium and history of anesthetic complications  Airway Mallampati: III  TM Distance: <3 FB Neck ROM: full    Dental  (+) Chipped, Poor Dentition, Missing, Implants   Pulmonary neg shortness of breath, sleep apnea ,    Pulmonary exam normal        Cardiovascular Exercise Tolerance: Good hypertension, + CAD and + Cardiac Stents  Normal cardiovascular exam     Neuro/Psych PSYCHIATRIC DISORDERS Dementia  Neuromuscular disease    GI/Hepatic Neg liver ROS, GERD  Controlled,  Endo/Other  Hypothyroidism   Renal/GU Renal disease  negative genitourinary   Musculoskeletal   Abdominal   Peds  Hematology negative hematology ROS (+)   Anesthesia Other Findings Past Medical History: 1980s: Anosmia 2018: Atrial flutter ([place])     Comment:  had an ablation with dr. Erick Alley 01/11/2022: Basal cell carcinoma     Comment:  L cheek - excised 04/25/22 No date: CAD (coronary artery disease) No date: Cancer ([place])     Comment:  skin cancer No date: Chronic constipation No date: Chronic insomnia No date: Chronic lower back pain     Comment:  s/p spine stimulator placement No date: Complication of anesthesia     Comment:  when under general, he woke up agitated and unable to be              held down No date: Diastolic CHF ([place]) 1478: History of diverticulitis 2013: History of pneumonia No date: Hyperlipidemia No date: Hypertension No date: Hypothyroidism No date: Obesity, Class II, BMI 35-39.9, with comorbidity No date: OSA on CPAP No date: PONV (postoperative nausea and vomiting)  Past Surgical History: 05/07/2017: A-FLUTTER ABLATION; N/A     Comment:  Procedure:  A-Flutter Ablation;  Surgeon: Deboraha Sprang, MD;  Location: [facility];  Service:               Cardiovascular;  Laterality: N/A; 11/2017: CARDIOVASCULAR STRESS TEST     Comment:  no ischemia, low risk study 2007: COLONOSCOPY     Comment:  normal per prior PCP records, rpt 10 yrs (Dr Osie Cheeks) No date: ELBOW SURGERY; Right     Comment:  ulnar nerve decompression.  PT DOES NOT RECALL THIS               PROCEDURE 05/05/2020: ENDOVASCULAR REPAIR/STENT GRAFT; N/A     Comment:  Procedure: ENDOVASCULAR REPAIR/STENT GRAFT;  Surgeon:               Algernon Huxley, MD;  Location: [facility];                Service: Cardiovascular;  Laterality: N/A; 08/2018: LAMINECTOMY THORACIC SPINE W/ PLACEMENT SPINAL CORD  STIMULATOR     Comment:  and removal of prior spine stimulator (Dr Lacinda Axon) No date: NASAL SINUS SURGERY     Comment:  nasal polyps. done a long time ago 2013: [place] (PCI-S)     Comment:  EF55%, 70% mid LAD, 99% mid RCA, mild MR, elev LVEDP,               DES to mid LAD. RCA is nondominant  06/2017: RADIOFREQUENCY ABLATION     Comment:  lumbar region ([place])  2000s: REPLACEMENT TOTAL KNEE BILATERAL; Bilateral 06/2016: SPINAL CORD STIMULATOR IMPLANT 09/09/2018: SPINAL CORD STIMULATOR INSERTION; N/A     Comment:  Procedure: LUMBAR SPINAL CORD STIMULATOR LEAD AND               BATTERY REMOVAL AND LAMINECTOMY FOR PLACEMENT OF PADDLE;               Surgeon: Deetta Perla, MD;  Location: [facility];  Service:              Neurosurgery;  Laterality: N/A; 09/16/2018: SPINAL CORD STIMULATOR INSERTION; N/A     Comment:  Procedure: PLACEMENT OF SPINAL CORD STIMULATOR BATTERY;               Surgeon: Deetta Perla, MD;  Location: [facility];  Service:              Neurosurgery;  Laterality: N/A;     Reproductive/Obstetrics negative OB ROS                             Anesthesia Physical Anesthesia Plan  ASA:  3  Anesthesia Plan: General   Post-op Pain Management:    Induction: Intravenous  PONV Risk Score and Plan: Propofol infusion and TIVA  Airway Management Planned: Natural Airway and Nasal Cannula  Additional Equipment:   Intra-op Plan:   Post-operative Plan:   Informed Consent: I have reviewed the patients History and Physical, chart, labs and discussed the procedure including the risks, benefits and alternatives for the proposed anesthesia with the patient or authorized representative who has indicated his/her understanding and acceptance.   Patient has DNR.  Discussed DNR with power of attorney and Suspend DNR.   Dental Advisory Given  Plan Discussed with: Anesthesiologist, CRNA and Surgeon  Anesthesia Plan Comments: (History and phone consent from the patients son Valon Glasscock at [phone]   Son consented for risks of anesthesia including but not limited to:  - adverse reactions to medications - risk of airway placement if required - damage to eyes, teeth, lips or other oral mucosa - nerve damage due to positioning  - sore throat or hoarseness - Damage to heart, brain, nerves, lungs, other parts of body or loss of life  He voiced understanding.)        Anesthesia Quick Evaluation

## 2022-05-30 NOTE — Progress Notes (Addendum)
OT Cancellation Note  Patient Details Name: Anthony [name] MRN: [id number] DOB: Aug 09, 1936   Cancelled Treatment:    Reason Eval/Treat Not Completed: Other (comment). Chart reviewed. Pt currently receiving a blood transfusion. Will hold OT tx at this time and re-attempt at later time as appropriate.   Addendum: pt out of the room on 2nd attempt. Will re-attempt next date as medically appropriate.  Ardeth Perfect., MPH, MS, OTR/L ascom [phone] 05/30/22, 9:44 AM

## 2022-05-30 NOTE — Progress Notes (Signed)
       CROSS COVER NOTE  NAME: DAVYN MORANDI MRN: [id number] DOB : [birth year]    Date of Service   05/30/2022   HPI/Events of Note   RN reports Mr Hereford had two large black bowel movements. BP 114/64 HR 71.   Interventions   Assessment/Plan:  Recheck H&H- Trend over last 24 hrs 7.7-->7.9-->8.5-->8.4-->7.0-->7.0 Type and Screen Transfuse 1U PRBC       This document was prepared using Dragon voice recognition software and may include unintentional dictation errors.  Neomia Glass DNP, MHA, FNP-BC Nurse Practitioner [facility] Pager [phone]

## 2022-05-30 NOTE — Progress Notes (Signed)
PROGRESS NOTE    Anthony [name]  TOI:[id number] DOB: 1936/06/30 DOA: 05/22/2022  PCP: Ria Bush, MD   Brief Narrative: This 86 yrs old male with medical history significant of atrial flutter,  status post ablation, chronic back pain status post spine stimulator which is malfunctioning, hypertension, hyperlipidemia, hypothyroidism, obesity, obstructive sleep apnea on CPAP comes to the emergency room with complaints of lower back pain radiating bilaterally to both legs,  going down along with increased urinary frequency.  Patient is found to have Staph aureus bacteremia, back pain is concerning for epidural abscess/early discitis.  Infectious diseases consulted, recommended IV antibiotics.  Neurosurgery was consulted,  states No neurosurgical intervention needed, recommended IV steroids and IV antibiotics.  Echo: no evidence of vegetation.  LVEF 83%, grade 1 diastolic dysfunction. CT lumbar spine with no evidence of abscess or discitis or osteomyelitis.  Patient continued to have leukocytosis despite being on Nafcillin, UA with positive LE, started on cefepime.  ID recommended 6 weeks of IV antibiotics if we are not pursuing TEE.  Patient has 2 black bowel movements with drop in hemoglobin.  GI is consulted.  Patient is scheduled to have EGD today.  Assessment & Plan:   Principal Problem:   Sepsis ([place]) Active Problems:   Coag negative Staphylococcus bacteremia   Gastrointestinal hemorrhage with melena  Severe sepsis POA:  Staph Capitis bacteremia: He was found to have tachycardia , elevated white count of 21K, lactic acid of 2.0, increased creatinine to 2.6 from baseline 1.3. Lactic acid normalized, 1.2 > 2.0.  It could be UTI, back pain with possible discitis given recent procedure. Infectious disease Dr. Delaine Lame consulted.  Antibiotics changed to daptomycin. Repeat blood cultures: No growth so far, Antibiotics changed to nafcillin.  Echo with no evidence of vegetation, CT lumbar  spine: no evidence of discitis, abscess or osteomyelitis. WBC count continues to trend up.  UA positive LE, chest x-ray unremarkable. Cefepime added for persistent leukocytosis. Discussed with ID,  recommended to discontinue nafcillin and consider TEE. ID recommended 6 weeks of IV antibiotics if we are not pursuing TEE.  Chronic back pain with severe degenerative arthritis: Chronic pain /history of spinal cord pain stimulator-- malfunctioning. Patient follows with pain clinic Dr. Zollie Scale as an outpatient, He recently underwent lumbar spinal steroid injection on 20 September 23 CRP 42.2, ESR 10.0 Neurosurgery consultation with Dr. Cari Caraway completed, given increase in weakness more on the left and right-- recommends brace and follow-up outpatient for malfunctioning pain stimulator. Unable to do MRI given stimulator, needs better imaging. Continue PRN PO and IV pain meds Continue muscle relaxants-- patient is on gabapentin. CT lumbar spine no evidence of discitis, abscess or osteomyelitis.   AKI on CKD stage IIIb:  Baseline creatinine 1.3 Presented  with creatinine of 2.62--2.18 >2.25>1.42 > 1.24 >1.34>1.53>1.28 Continue IV fluids, monitor input output, Avoid nephrotoxic agents AKI resolved.  Serum creatinine at baseline.   Leukocytosis: Likely reactive due to steroids versus to infection. --WBC 21K-18K-19.1- 20.9>25.6 Patient remained on daptomycin for staph bacteremia. Antibiotic changed to nafcillin. UA+ LE Added cefepime for gram-negative coverage. As per ID, nafcillin discontinued, continue cefepime for now.   Elevated troponin: Likely demand ischemia in the setting of sepsis. Patient denies any chest pain, dizziness, palpitations. He has hx.  of CAD with stent placed in LAD few years ago He has hx. of atrial flutter status post ablation in the past. Patient follows with [facility] cardiology Aspirin and Plavix is on hold.  Continue statins.   Hypothyroidism Continue Synthroid.  Hyperlipidemia  Continue Zetia.  Hypokalemia: Replaced.  Continue to monitor   Upper GI bleed: Acute blood loss anemia: Patient has 2 dark black stools. Hb trended down 10.7>9.7>7.7>8.5>7.0 GI consulted.  RBC tagged scan negative for acute bleeding. Monitor H&H every 12 hours.  Transfuse 1 unit PRBC. Hold Plavix and aspirin.  GI is consulted Patient is scheduled for EGD today.  DVT prophylaxis:SCDs Code Status: DNR Family Communication: No family at bedside Disposition Plan:   Status is: Inpatient Remains inpatient appropriate because: Admitted for worsening lower back pain radiating towards the lower extremities.  Blood cultures positive for Staph aureus , ID and Neurosurgery consulted,  started on IV daptomycin.  Antibiotic changed to IV nafcillin. Echocardiogram and CT myelogram completed, no evidence of vegetation or discitis or osteomyelitis or abscess.  ID recommended 6 weeks of IV antibiotic if not pursuing TEE.  Patient has developed dark stools scheduled for EGD today.  PT recommended SNF.  Patient is not medically clear.   Consultants:  Neurosurgery Infectious diseases  Procedures: None Antimicrobials: Daptomycin, > nafcillin > Cefepime  Subjective: Patient was seen and examined at bedside.  Overnight events noted. Patient reports pain is reasonably controlled.  He is lying comfortably on his back.  Renal functions are improving. Patient has 2 dark stools in the morning.  He is  scheduled for EGD today.  Objective: Vitals:   05/30/22 0801 05/30/22 0833 05/30/22 0836 05/30/22 1028  BP: (!) 142/78 135/66 135/66 129/80  Pulse: 82 67 67 89  Resp: '17 18 18 18  ' Temp: 98.8 F (37.1 C) 98.8 F (37.1 C) 98.8 F (37.1 C) 97.6 F (36.4 C)  TempSrc:  Oral Oral Temporal  SpO2: 98% 97% 97% 97%  Weight:    122.5 kg  Height:    6' (1.829 m)    Intake/Output Summary (Last 24 hours) at 05/30/2022 1041 Last data filed at 05/30/2022 0820 Gross per 24 hour  Intake  713.91 ml  Output 2325 ml  Net -1611.09 ml   Filed Weights   05/30/22 1028  Weight: 122.5 kg    Examination:  General exam: Appears comfortable, morbidly obese, NAD, deconditioned. Respiratory system: CTA bilaterally, respiratory effort normal, RR 15 Cardiovascular system: S1 & S2 heard, regular rate and rhythm, no murmur. Gastrointestinal system: Abdomen is soft, non tender, non distended, BS+ Central nervous system: Alert and oriented x 2, no focal neurological deficits. Back: Tenderness noted in the lower back, restricted movements. Extremities: No edema, no cyanosis, no clubbing. Skin: No rashes, lesions or ulcers Psychiatry: Judgement and insight appear normal. Mood & affect appropriate.     Data Reviewed: I have personally reviewed following labs and imaging studies  CBC: Recent Labs  Lab 05/25/22 0545 05/26/22 0713 05/27/22 0617 05/28/22 0620 05/29/22 0531 05/29/22 0937 05/29/22 1416 05/29/22 1943 05/30/22 0117 05/30/22 0512 05/30/22 0719  WBC 19.9* 20.9* 25.6* 25.4* 24.4*  --   --   --   --   --   --   HGB 10.7* 10.7* 10.9* 9.7* 7.7*   < > 8.5* 8.4* 7.0* 7.0* 7.0*  HCT 31.8* 31.2* 32.2* 29.6* 23.4*   < > 25.9* 26.3* 21.5* 21.5* 21.4*  MCV 91.4 90.7 91.5 93.4 93.6  --   --   --   --   --   --   PLT 267 293 360 352 331  --   --   --   --   --   --    < > = values in  this interval not displayed.   Basic Metabolic Panel: Recent Labs  Lab 05/25/22 0545 05/26/22 0713 05/27/22 0617 05/28/22 0620 05/29/22 0531 05/30/22 0117 05/30/22 0830  NA 141 139 144 142 139  --  135  K 3.5 3.3* 3.1* 3.2* 3.3*  --  3.6  CL 111 110 111 110 107  --  105  CO2 '23 25 27 26 26  ' --  26  GLUCOSE 171* 160* 173* 166* 149*  --  163*  BUN 43* 35* 31* 50* 64*  --  41*  CREATININE 1.42* 1.24 1.20 1.34* 1.53* 1.32* 1.28*  CALCIUM 9.9 9.8 9.9 9.4 9.1  --  8.8*  MG 2.0  --   --   --   --   --   --   PHOS 2.9  --   --   --   --   --   --    GFR: Estimated Creatinine Clearance:  58.1 mL/min (A) (by C-G formula based on SCr of 1.28 mg/dL (H)). Liver Function Tests: Recent Labs  Lab 05/26/22 0713 05/30/22 0830  AST 36 22  ALT 29 19  ALKPHOS 88 50  BILITOT 1.3* 0.6  PROT 6.7 5.4*  ALBUMIN 2.7* 2.0*   No results for input(s): "LIPASE", "AMYLASE" in the last 168 hours. No results for input(s): "AMMONIA" in the last 168 hours. Coagulation Profile: No results for input(s): "INR", "PROTIME" in the last 168 hours. Cardiac Enzymes: Recent Labs  Lab 05/24/22 1501  CKTOTAL 110   BNP (last 3 results) No results for input(s): "PROBNP" in the last 8760 hours. HbA1C: No results for input(s): "HGBA1C" in the last 72 hours. CBG: No results for input(s): "GLUCAP" in the last 168 hours. Lipid Profile: No results for input(s): "CHOL", "HDL", "LDLCALC", "TRIG", "CHOLHDL", "LDLDIRECT" in the last 72 hours. Thyroid Function Tests: No results for input(s): "TSH", "T4TOTAL", "FREET4", "T3FREE", "THYROIDAB" in the last 72 hours. Anemia Panel: No results for input(s): "VITAMINB12", "FOLATE", "FERRITIN", "TIBC", "IRON", "RETICCTPCT" in the last 72 hours. Sepsis Labs: No results for input(s): "PROCALCITON", "LATICACIDVEN" in the last 168 hours.   Recent Results (from the past 240 hour(s))  Blood culture (routine x 2)     Status: Abnormal   Collection Time: 05/22/22 12:43 PM   Specimen: Right Antecubital; Blood  Result Value Ref Range Status   Specimen Description   Final    RIGHT ANTECUBITAL Performed at [facility], [address] [place], [place] [address]    Special Requests   Final    BOTTLES DRAWN AEROBIC AND ANAEROBIC Blood Culture adequate volume Performed at [facility], [address] [place], [place] [address]    Culture  Setup Time   Final    GRAM POSITIVE COCCI IN BOTH AEROBIC AND ANAEROBIC BOTTLES CRITICAL RESULT CALLED TO, READ BACK BY AND VERIFIED WITH: [facility] HALLHAJI 05/23/22 8299 MW Performed at [facility], [place] [address] [place], [place] [address]    Culture STAPHYLOCOCCUS CAPITIS (A)  Final   Report Status 05/25/2022 FINAL  Final   Organism ID, Bacteria STAPHYLOCOCCUS CAPITIS  Final      Susceptibility   Staphylococcus capitis - MIC*    CIPROFLOXACIN <=0.5 SENSITIVE Sensitive     ERYTHROMYCIN <=0.25 SENSITIVE Sensitive     GENTAMICIN <=0.5 SENSITIVE Sensitive     OXACILLIN <=0.25 SENSITIVE Sensitive     TETRACYCLINE <=1 SENSITIVE Sensitive     VANCOMYCIN 1 SENSITIVE Sensitive     TRIMETH/SULFA <=10 SENSITIVE Sensitive  CLINDAMYCIN <=0.25 SENSITIVE Sensitive     RIFAMPIN <=0.5 SENSITIVE Sensitive     Inducible Clindamycin NEGATIVE Sensitive     * STAPHYLOCOCCUS CAPITIS  Blood Culture ID Panel (Reflexed)     Status: Abnormal   Collection Time: 05/22/22 12:43 PM  Result Value Ref Range Status   Enterococcus faecalis NOT DETECTED NOT DETECTED Final   Enterococcus Faecium NOT DETECTED NOT DETECTED Final   Listeria monocytogenes NOT DETECTED NOT DETECTED Final   Staphylococcus species DETECTED (A) NOT DETECTED Final    Comment: CRITICAL RESULT CALLED TO, READ BACK BY AND VERIFIED WITH: [facility] HALLHAJI 05/23/22 0906 MW    Staphylococcus aureus (BCID) NOT DETECTED NOT DETECTED Final   Staphylococcus epidermidis NOT DETECTED NOT DETECTED Final   Staphylococcus lugdunensis NOT DETECTED NOT DETECTED Final   Streptococcus species NOT DETECTED NOT DETECTED Final   Streptococcus agalactiae NOT DETECTED NOT DETECTED Final   Streptococcus pneumoniae NOT DETECTED NOT DETECTED Final   Streptococcus pyogenes NOT DETECTED NOT DETECTED Final   A.calcoaceticus-baumannii NOT DETECTED NOT DETECTED Final   Bacteroides fragilis NOT DETECTED NOT DETECTED Final   Enterobacterales NOT DETECTED NOT DETECTED Final   Enterobacter cloacae complex NOT DETECTED NOT DETECTED Final   Escherichia coli NOT DETECTED NOT DETECTED Final   Klebsiella aerogenes NOT DETECTED NOT DETECTED Final   Klebsiella oxytoca NOT DETECTED  NOT DETECTED Final   Klebsiella pneumoniae NOT DETECTED NOT DETECTED Final   Proteus species NOT DETECTED NOT DETECTED Final   Salmonella species NOT DETECTED NOT DETECTED Final   Serratia marcescens NOT DETECTED NOT DETECTED Final   Haemophilus influenzae NOT DETECTED NOT DETECTED Final   Neisseria meningitidis NOT DETECTED NOT DETECTED Final   Pseudomonas aeruginosa NOT DETECTED NOT DETECTED Final   Stenotrophomonas maltophilia NOT DETECTED NOT DETECTED Final   Candida albicans NOT DETECTED NOT DETECTED Final   Candida auris NOT DETECTED NOT DETECTED Final   Candida glabrata NOT DETECTED NOT DETECTED Final   Candida krusei NOT DETECTED NOT DETECTED Final   Candida parapsilosis NOT DETECTED NOT DETECTED Final   Candida tropicalis NOT DETECTED NOT DETECTED Final   Cryptococcus neoformans/gattii NOT DETECTED NOT DETECTED Final    Comment: Performed at [facility], [address] [place], [place] [address]  Blood culture (routine x 2)     Status: Abnormal   Collection Time: 05/22/22 12:48 PM   Specimen: Left Antecubital; Blood  Result Value Ref Range Status   Specimen Description   Final    LEFT ANTECUBITAL Performed at [facility], [address], [place], [place] [address]    Special Requests   Final    BOTTLES DRAWN AEROBIC AND ANAEROBIC Blood Culture adequate volume Performed at [facility], [address] [place], [place] [address]    Culture  Setup Time   Final    GRAM POSITIVE COCCI IN BOTH AEROBIC AND ANAEROBIC BOTTLES CRITICAL RESULT CALLED TO, READ BACK BY AND VERIFIED WITH: [facility] HALLHAJI 05/23/22 2952 MW Performed at [facility], [address] [place], [place] [address]    Culture (A)  Final    STAPHYLOCOCCUS CAPITIS SUSCEPTIBILITIES PERFORMED ON PREVIOUS CULTURE WITHIN THE LAST 5 DAYS. Performed at [facility], [place] [address] [place], [place] [address]    Report Status 05/25/2022 FINAL  Final  Urine Culture      Status: Abnormal   Collection Time: 05/22/22  3:28 PM   Specimen: Urine, Clean Catch  Result Value Ref Range Status   Specimen Description  Final    URINE, CLEAN CATCH Performed at [facility], [address], [place], [place] [address]    Special Requests   Final    NONE Performed at [facility], [address] [place], [place] [address]    Culture (A)  Final    20,000 COLONIES/mL AEROCOCCUS SPECIES Standardized susceptibility testing for this organism is not available. Performed at [facility], [place] [address] [place], [place] [address]    Report Status 05/24/2022 FINAL  Final  Culture, blood (Routine X 2) w Reflex to ID Panel     Status: None (Preliminary result)   Collection Time: 05/26/22  8:08 AM   Specimen: BLOOD  Result Value Ref Range Status   Specimen Description BLOOD BLOOD LEFT HAND  Final   Special Requests   Final    BOTTLES DRAWN AEROBIC AND ANAEROBIC Blood Culture adequate volume   Culture   Final    NO GROWTH 4 DAYS Performed at [facility], [address] [place], [place] [address]    Report Status PENDING  Incomplete  Culture, blood (Routine X 2) w Reflex to ID Panel     Status: None (Preliminary result)   Collection Time: 05/26/22  8:08 AM   Specimen: BLOOD  Result Value Ref Range Status   Specimen Description BLOOD BLOOD LEFT ARM  Final   Special Requests   Final    BOTTLES DRAWN AEROBIC AND ANAEROBIC Blood Culture adequate volume   Culture   Final    NO GROWTH 4 DAYS Performed at [facility], [address] [place], [place] [address]    Report Status PENDING  Incomplete  Urine Culture     Status: None   Collection Time: 05/28/22 12:15 PM   Specimen: Urine, Clean Catch  Result Value Ref Range Status   Specimen Description   Final    URINE, CLEAN CATCH Performed at [facility], [address], [place], [place] [address]    Special Requests   Final    NONE Performed at [facility], [address] [place], [place] [address]    Culture   Final    NO GROWTH Performed at [facility], [place] [address], [place], [place] [address]    Report Status 05/29/2022 FINAL  Final    Radiology Studies: NM GI Blood Loss  Result Date: 05/29/2022 CLINICAL DATA:  Dark bowel movements containing blood today, history of diverticulitis EXAM: NUCLEAR MEDICINE GASTROINTESTINAL BLEEDING SCAN TECHNIQUE: Sequential abdominal images were obtained following intravenous administration of Tc-73mlabeled red blood cells. RADIOPHARMACEUTICALS:  21.11 mCi Tc-931mertechnetate in-vitro labeled red cells. COMPARISON:  CT abdomen and pelvis 05/22/2022 FINDINGS: Anterior planar images of the abdomen were obtained for 2 hours after the intravenous administration of radiotracer. Evaluation is slightly limited by patient motion throughout the exam. There are no areas of abnormal radiotracer accumulation or propagation to suggest active gastrointestinal bleeding. Minimal activity is seen within the kidneys and bladder. IMPRESSION: 1. No evidence of active gastrointestinal bleeding. Electronically Signed   By: MiRanda Ngo.D.   On: 05/29/2022 16:56    Scheduled Meds:  [MAR Hold] amLODipine  5 mg Oral Daily   [MAR Hold] Chlorhexidine Gluconate Cloth  6 each Topical Q0600   [MAR Hold] ezetimibe  10 mg Oral Daily   [MAR Hold] finasteride  5 mg Oral Daily   [MAR Hold] furosemide  40 mg Oral Daily   [MAR Hold] gabapentin  600 mg Oral  QHS   [MAR Hold] levothyroxine  175 mcg Oral Q0600   [MAR Hold] linaclotide  290 mcg Oral QAC breakfast   [MAR Hold] pantoprazole (PROTONIX) IV  40 mg Intravenous Q12H   [MAR Hold] tamsulosin  0.4 mg Oral Daily   Continuous Infusions:  sodium chloride 20 mL/hr at 05/30/22 1041   [MAR Hold]  ceFAZolin (ANCEF) IV       LOS: 8 days    Time spent: 50 mins    Breanah Faddis, MD [facility]   If 7PM-7AM, please contact night-coverage

## 2022-05-30 NOTE — Anesthesia Postprocedure Evaluation (Signed)
Anesthesia Post Note  [name]  Procedure(s) Performed: ESOPHAGOGASTRODUODENOSCOPY (EGD) WITH PROPOFOL  Patient location during evaluation: Endoscopy Anesthesia Type: General Level of consciousness: awake and alert Pain management: pain level controlled Vital Signs Assessment: post-procedure vital signs reviewed and stable Respiratory status: spontaneous breathing, nonlabored ventilation, respiratory function stable and patient connected to nasal cannula oxygen Cardiovascular status: blood pressure returned to baseline and stable Postop Assessment: no apparent nausea or vomiting Anesthetic complications: no   No notable events documented.   Last Vitals:  Vitals:   05/30/22 1113 05/30/22 1123  BP: 138/79 (!) 143/76  Pulse: 75 83  Resp: 19 18  Temp:    SpO2: 98% 96%    Last Pain:  Vitals:   05/30/22 1103  TempSrc: Tympanic  PainSc: [place]

## 2022-05-30 NOTE — Plan of Care (Signed)

## 2022-05-30 NOTE — Consult Note (Signed)
Pharmacy Antibiotic Note  Anthony [name] is a 86 y.o. male admitted on 05/22/2022 with bacteremia. PMH significant for AFlutter s/p ablation, chronic back pain s/p stent spine stimulator (currently malfunctioning), HTN, HLD, hypothyroidism, obesity, OSA on CPAP. Patient presenting after mechanical fall on 9/25 and found to have AKI with Scr 2.62 in ED with recovery to 2.14 after IV fluid administration (baseline 1.1-1.4). Of note, patient had recent lumbar epidural injection on 9/20 and is being followed by neuro. Patient with persistent leukocytosis despite treatment with nafcillin. Repeat UA on 9/30 with >50 WBC, >50 RBC.  Pharmacy consulted for Cefepime dosing.   Today, 05/30/2022 Day #8 total antibiotics, Day #3 cefepime Renal: Scr improving following contrast WBC remains elevated on last check Afebrile 10/1 Ucx: No growth   Plan: Discuss with ID,  Urine culture is no growth so change to cefazolin 2gm IV q8h Continue to monitor renal function   Temp (24hrs), Avg:98.5 F (36.9 C), Min:97.7 F (36.5 C), Max:98.8 F (37.1 C)  Recent Labs  Lab 05/24/22 1300 05/25/22 0545 05/26/22 0713 05/27/22 0617 05/28/22 0620 05/29/22 0531 05/30/22 0117 05/30/22 0830  WBC  --  19.9* 20.9* 25.6* 25.4* 24.4*  --   --   CREATININE  --  1.42* 1.24 1.20 1.34* 1.53* 1.32* 1.28*  VANCORANDOM 8  --   --   --   --   --   --   --      Estimated Creatinine Clearance: 58.1 mL/min (A) (by C-G formula based on SCr of 1.28 mg/dL (H)).    Allergies  Allergen Reactions   Questran [Cholestyramine]     Patient not aware of an allergy to this medicine.   Atorvastatin     Muscle pain   Gemfibrozil Other (See Comments)    Muscle pain.   Metformin And Related     Dizziness   Rosuvastatin     Muscle pain    Antimicrobials this admission: 9/25 Ceftriaxone 1g x1 in ED 9/26 Vancomycin 2000 mg x1  9/27 Daptomcyin 950 mg x1 9/28 Nafcillin >> 10/2 10/1 Cefepime >>10/3 10/3 Cefazolin >>  Dose  adjustments this admission: N/A  Microbiology results: 9/25 BCx: 4 out of 4 bottles growing Staphylococcus capitis (pansensitive) 9/25 UCx: 20k Aerococcus spp.  9/29 BCx: NG2D 9/29 Ucx: NG  Thank you for allowing pharmacy to be a part of this patient's care.  Doreene Eland, PharmD, BCPS, BCIDP Work Cell: [phone] 05/30/2022 9:46 AM

## 2022-05-31 ENCOUNTER — Encounter: Payer: Self-pay | Admitting: Gastroenterology

## 2022-05-31 DIAGNOSIS — B957 Other staphylococcus as the cause of diseases classified elsewhere: Secondary | ICD-10-CM | POA: Diagnosis not present

## 2022-05-31 DIAGNOSIS — M5441 Lumbago with sciatica, right side: Secondary | ICD-10-CM | POA: Diagnosis not present

## 2022-05-31 DIAGNOSIS — A419 Sepsis, unspecified organism: Secondary | ICD-10-CM | POA: Diagnosis not present

## 2022-05-31 DIAGNOSIS — M5442 Lumbago with sciatica, left side: Secondary | ICD-10-CM | POA: Diagnosis not present

## 2022-05-31 DIAGNOSIS — K921 Melena: Secondary | ICD-10-CM | POA: Diagnosis not present

## 2022-05-31 DIAGNOSIS — R7881 Bacteremia: Secondary | ICD-10-CM | POA: Diagnosis not present

## 2022-05-31 DIAGNOSIS — R6520 Severe sepsis without septic shock: Secondary | ICD-10-CM | POA: Diagnosis not present

## 2022-05-31 LAB — BPAM RBC
Blood Product Expiration Date: 202311072359
ISSUE DATE / TIME: 202310030811
Unit Type and Rh: 5100

## 2022-05-31 LAB — CULTURE, BLOOD (ROUTINE X 2)
Culture: NO GROWTH
Culture: NO GROWTH
Special Requests: ADEQUATE
Special Requests: ADEQUATE

## 2022-05-31 LAB — BASIC METABOLIC PANEL
Anion gap: 6 (ref 5–15)
BUN: 20 mg/dL (ref 8–23)
CO2: 28 mmol/L (ref 22–32)
Calcium: 9.3 mg/dL (ref 8.9–10.3)
Chloride: 102 mmol/L (ref 98–111)
Creatinine, Ser: 1.15 mg/dL (ref 0.61–1.24)
GFR, Estimated: >60 mL/min (ref >60)
Glucose, Bld: 134 mg/dL — ABNORMAL HIGH (ref 70–99)
Potassium: 3.3 mmol/L — ABNORMAL LOW (ref 3.5–5.1)
Sodium: 136 mmol/L (ref 135–145)

## 2022-05-31 LAB — CBC
HCT: 25.8 % — ABNORMAL LOW (ref 39.0–52.0)
Hemoglobin: 8.4 g/dL — ABNORMAL LOW (ref 13.0–17.0)
MCH: 30.7 pg (ref 26.0–34.0)
MCHC: 32.6 g/dL (ref 30.0–36.0)
MCV: 94.2 fL (ref 80.0–100.0)
Platelets: 456 10*3/uL — ABNORMAL HIGH (ref 150–400)
RBC: 2.74 MIL/uL — ABNORMAL LOW (ref 4.22–5.81)
RDW: 15.3 % (ref 11.5–15.5)
WBC: 21.7 10*3/uL — ABNORMAL HIGH (ref 4.0–10.5)
nRBC: 0 % (ref 0.0–0.2)

## 2022-05-31 LAB — PHOSPHORUS: Phosphorus: 2.8 mg/dL (ref 2.5–4.6)

## 2022-05-31 LAB — TYPE AND SCREEN
ABO/RH(D): O POS
Antibody Screen: NEGATIVE
Unit division: 0

## 2022-05-31 LAB — MAGNESIUM: Magnesium: 2 mg/dL (ref 1.7–2.4)

## 2022-05-31 MED ORDER — ENSURE ENLIVE PO LIQD
237.0000 mL | Freq: Three times a day (TID) | ORAL | Status: DC
  Administered 2022-05-31 – 2022-06-08 (×23): 237 mL via ORAL

## 2022-05-31 MED ORDER — POTASSIUM CHLORIDE CRYS ER 20 MEQ PO TBCR
40.0000 meq | EXTENDED_RELEASE_TABLET | Freq: Once | ORAL | Status: AC
  Administered 2022-05-31: 40 meq via ORAL
  Filled 2022-05-31: qty 2

## 2022-05-31 NOTE — Plan of Care (Signed)

## 2022-05-31 NOTE — Progress Notes (Signed)
Physical Therapy Treatment Patient Details Name: Anthony [name] MRN: [id number] DOB: [birth year] Today's Date: 05/31/2022   History of Present Illness This 86 yrs old male with medical history significant of atrial flutter, status post ablation, chronic back pain status post stent spine stimulator which is malfunctioning, hypertension, hyperlipidemia, hypothyroidism, obesity, obstructive sleep apnea on CPAP comes to the emergency room with complaints of lower back pain radiating bilaterally to both legs,  going down along with increased urinary frequency. Patient is found to have Staph aureus bacteremia, back pain is concerning for epidural abscess/early discitis.  Infectious diseases consulted recommended IV antibiotics. Neurosurgery is consulted states no neurosurgical intervention needed, recommended IV steroids and IV antibiotics. If additional work-up is needed could consider MRI versus CT myelogram.    PT Comments    Pt was sitting in recliner requesting to return to bed. He is alert however disoriented to current situation and likes to dictate session progression. Needs max vcs and encouragement to perform task author's desired way versus his unsafe way. Severely pain limited and requiring extensive amount of time to perform the simplest task due to this pain. Needs constant encouragement to stay focused on desired task at hand. He requested to attempt stand but was unable. Perform squat pivot from recliner(L armrest dropped) to EOB with max assist of one. Highly recommend +2 assistance for any/all future transfers until pt's pain and strength improves. Family not present to state pt's baseline cognition. Overall pt is severely limited and will require extensive PT to maximize independence while decreasing caregiver burden.  SNF currently most appropriate at [facility]. Acute PT will continue to follow.    Recommendations for follow up therapy are one component of a multi-disciplinary discharge planning  process, led by the attending physician.  Recommendations may be updated based on patient status, additional functional criteria and insurance authorization.  Follow Up Recommendations  Skilled nursing-short term rehab (<3 hours/day)     Assistance Recommended at Discharge Frequent or constant Supervision/Assistance  Patient can return home with the following Two people to help with walking and/or transfers;A lot of help with bathing/dressing/bathroom;Assistance with cooking/housework;Direct supervision/assist for medications management;Direct supervision/assist for financial management;Assist for transportation;Help with stairs or ramp for entrance   Equipment Recommendations  None recommended by PT       Precautions / Restrictions Precautions Precautions: Fall Restrictions Weight Bearing Restrictions: No     Mobility  Bed Mobility Overal bed mobility: Needs Assistance Bed Mobility: Sit to Supine  Sit to supine: Max assist  General bed mobility comments: Once seated EOB, pt required extensive time to progress to supine due to pain. max assist to progress BLEs into bed    Transfers Overall transfer level: Needs assistance Equipment used: Rolling walker (2 wheels) Transfers: Sit to/from Stand Sit to Stand:  (pt was unable to stand to RW)  Squat pivot transfers: Max assist     General transfer comment: Pt was seated in recliner requesting to return to bed. Pt attempted to stand 1 x to RW from Recliner however unsafe and unable. Perform lateral squat pivot with L armrest lowered + max assist. Increased time due to pt's pain and wanting to dictate session progression    Ambulation/Gait  General Gait Details: Currently unable   Balance Overall balance assessment: Needs assistance Sitting-balance support: Feet supported Sitting balance-Leahy Scale: Fair  Standing balance support: Bilateral upper extremity supported, Reliant on assistive device for balance Standing balance-Leahy  Scale: Poor       Cognition Arousal/Alertness: Awake/alert Behavior  During Therapy: Impulsive, Anxious Overall Cognitive Status: No family/caregiver present to determine baseline cognitive functioning      General Comments: Pt is alert however disoriented and confuses. he is able to follow simple commands with increased time + cueing. Pt is extremely limited by pain and likes to dicatate session progression           General Comments General comments (skin integrity, edema, etc.): very limited thr ex performed in chair and in bed. limited by pt's pain and unwillingess to perform desired task requested of him      Pertinent Vitals/Pain Pain Assessment Pain Assessment: 0-10 Pain Score: 8  Pain Location: low back Pain Descriptors / Indicators: Grimacing, Guarding, Discomfort, Sharp, Shooting Pain Intervention(s): Limited activity within patient's tolerance, Monitored during session, Premedicated before session, Repositioned     PT Goals (current goals can now be found in the care plan section) Acute Rehab PT Goals Patient Stated Goal: none stated Progress towards PT goals: Not progressing toward goals - comment (pain limited)    Frequency    Min 2X/week      PT Plan Current plan remains appropriate       AM-PAC PT "6 Clicks" Mobility   Outcome Measure  Help needed turning from your back to your side while in a flat bed without using bedrails?: A Lot Help needed moving from lying on your back to sitting on the side of a flat bed without using bedrails?: Total Help needed moving to and from a bed to a chair (including a wheelchair)?: A Lot Help needed standing up from a chair using your arms (e.g., wheelchair or bedside chair)?: Total Help needed to walk in hospital room?: Total Help needed climbing 3-5 steps with a railing? : Total 6 Click Score: 8    End of Session Equipment Utilized During Treatment: Gait belt Activity Tolerance: Patient limited by pain;Other  (comment) (pt's unwillingness to attempt desired task requested of him) Patient left: in bed;with call bell/phone within reach;with bed alarm set Nurse Communication: Mobility status PT Visit Diagnosis: Unsteadiness on feet (R26.81);Other abnormalities of gait and mobility (R26.89);Muscle weakness (generalized) (M62.81);Repeated falls (R29.6);Pain;Difficulty in walking, not elsewhere classified (R26.2) Pain - Right/Left: Left Pain - part of body: Leg     Time: 1410-1435 PT Time Calculation (min) (ACUTE ONLY): 25 min  Charges:  $Therapeutic Activity: 23-37 mins                     Julaine Fusi PTA 05/31/22, 5:33 PM

## 2022-05-31 NOTE — Progress Notes (Signed)
Initial Nutrition Assessment  DOCUMENTATION CODES:   Obesity unspecified  INTERVENTION:   -Liberalize diet to regular for wider variety of meal selections -MVI with minerals daily -Ensure Enlive po TID, each supplement provides 350 kcal and 20 grams of protein -Obtain new wt  NUTRITION DIAGNOSIS:   Inadequate oral intake related to poor appetite as evidenced by meal completion < 25%.  GOAL:   Patient will meet greater than or equal to 90% of their needs  MONITOR:   PO intake, Supplement acceptance  REASON FOR ASSESSMENT:   Rounds    ASSESSMENT:   Pt with medical history significant of atrial flutter is status post ablation, chronic back pain status post stent spine stimulator which is malfunctioning, hypertension, hyperlipidemia, hypothyroidism, obesity, obstructive sleep apnea on CPAP comes with complaints of back pain lower radiating bilaterally to both legs going down along with increased urinary frequency.  Pt admitted with severe sepsis and chronic  back pain with severe degenerative arthritis.   10/3- s/p EGD- revealed small amount of food residue on stomach, non bleeding gastric ulcer, and non bleeding duodenal ulcers.   Reviewed I/O's: -2.3 L x 24 hours and -14.6 L since admission  UOP: 2.9 L x 24 hours  Pt awoke when RD greeted him, but quickly went back to sleep. He explains he is in a great deal of pain and has not been sleeping well because of this. Pt did not arouse when RD continued to question him or during nutrition-focused physical exam.   Per RN, pt family is requesting Ensure. Noted poor oral intake on a heart healthy diet. Meal completion 0-25%.   Reviewed wt hx; wt has been stable over the past 4 months. However, suspect this is a stated wt.   Medications reviewed and include lasix.   Labs reviewed: K: 3.3.  NUTRITION - FOCUSED PHYSICAL EXAM:  Flowsheet Row Most Recent Value  Orbital Region No depletion  Upper Arm Region No depletion   Thoracic and Lumbar Region No depletion  Buccal Region No depletion  Temple Region No depletion  Clavicle Bone Region No depletion  Clavicle and Acromion Bone Region No depletion  Scapular Bone Region No depletion  Dorsal Hand No depletion  Patellar Region No depletion  Anterior Thigh Region No depletion  Posterior Calf Region No depletion  Edema (RD Assessment) Mild  Hair Reviewed  Eyes Reviewed  Mouth Reviewed  Skin Reviewed  Nails Reviewed       Diet Order:   Diet Order             Diet regular Room service appropriate? Yes; Fluid consistency: Thin  Diet effective now                   EDUCATION NEEDS:   Education needs have been addressed  Skin:  Skin Assessment: Reviewed RN Assessment  Last BM:  05/29/22  Height:   Ht Readings from Last 1 Encounters:  05/30/22 6' (1.829 m)    Weight:   Wt Readings from Last 1 Encounters:  05/30/22 122.5 kg    Ideal Body Weight:  80.9 kg  BMI:  Body mass index is 36.63 kg/m.  Estimated Nutritional Needs:   Kcal:  2200-2400  Protein:  105-120 grams  Fluid:  > 2 L    Loistine Chance, RD, LDN, [place] Registered Dietitian II Certified Diabetes Care and Education Specialist Please refer to [facility] for RD and/or RD on-call/weekend/after hours pager

## 2022-05-31 NOTE — Progress Notes (Signed)
Spoke with patient's son. Patient will go to [facility] at discharge. Tammy Ramsey in admissions at [facility] notified.

## 2022-05-31 NOTE — Progress Notes (Signed)
    Attending Progress Note  History: Anthony [name] is here for sepsis and difficulty with mobility, presumed epidural abscess.  He has no new complaints today, but has continued severe back pain and difficulty walking.  Physical Exam: Vitals:   05/30/22 2148 05/31/22 0533  BP: (!) 157/91 (!) 146/77  Pulse: 97 94  Resp: 18 18  Temp: 99 F (37.2 C) 97.9 F (36.6 C)  SpO2: 95% 96%    AA Ox self, hospital. Seems slightly confused. CNI  Strength:5/5 throughout BUE 3/5 IP on LLE, KE LLE  Data:  Recent Labs  Lab 05/29/22 0531 05/30/22 0117 05/30/22 0830 05/31/22 0420  NA 139  --  135 136  K 3.3*  --  3.6 3.3*  CL 107  --  105 102  CO2 26  --  26 28  BUN 64*  --  41* 20  CREATININE 1.53*   < > 1.28* 1.15  GLUCOSE 149*  --  163* 134*  CALCIUM 9.1  --  8.8* 9.3   < > = values in this interval not displayed.   Recent Labs  Lab 05/30/22 0830  AST 22  ALT 19  ALKPHOS 50     Recent Labs  Lab 05/28/22 0620 05/29/22 0531 05/29/22 0937 05/31/22 0420  WBC 25.4* 24.4*  --  21.7*  HGB 9.7* 7.7*   < > 8.4*  HCT 29.6* 23.4*   < > 25.8*  PLT 352 331  --  456*   < > = values in this interval not displayed.   No results for input(s): "APTT", "INR" in the last 168 hours.       Other tests/results: n/a  Assessment/Plan:  Anthony [name] continues to have severe back pain and left lower extremity weakness.  He has a nonfunctional SCS.  To consider further workup, he may need removal of his stimulator so that he can have an MRI.  I have discussed that with the patient and with Dr. Delaine Lame.  I will discuss with his son and then we will finalize a plan to move forward.   Meade Maw MD, [facility] Department of Neurosurgery

## 2022-05-31 NOTE — Progress Notes (Signed)
Date of Admission:  05/22/2022      ID: Anthony [name] is a 86 y.o. male Principal Problem:   Sepsis ([place]) Active Problems:   Coag negative Staphylococcus bacteremia   Gastrointestinal hemorrhage with melena  Son at bed side  Subjective: Pt in bed Tired Back pain persist   Medications:   amLODipine  5 mg Oral Daily   Chlorhexidine Gluconate Cloth  6 each Topical Q0600   ezetimibe  10 mg Oral Daily   feeding supplement  237 mL Oral TID BM   finasteride  5 mg Oral Daily   furosemide  40 mg Oral Daily   gabapentin  600 mg Oral QHS   levothyroxine  175 mcg Oral Q0600   linaclotide  290 mcg Oral QAC breakfast   pantoprazole (PROTONIX) IV  40 mg Intravenous Q12H   tamsulosin  0.4 mg Oral Daily    Objective: Vital signs in last 24 hours: Temp:  [97.9 F (36.6 C)-99 F (37.2 C)] 98.8 F (37.1 C) (10/04 1544) Pulse Rate:  [85-97] 95 (10/04 1544) Resp:  [18-22] 19 (10/04 1544) BP: (133-157)/(70-91) 133/70 (10/04 1544) SpO2:  [95 %-98 %] 98 % (10/04 1544)  PHYSICAL EXAM:  General: awake, oriented , less confusion today Lungs: Clear to auscultation bilaterally. No Wheezing or Rhonchi. No rales. Heart: Regular rate and rhythm, no murmur, rub or gallop. Abdomen: Soft, non-tender,not distended. Bowel sounds normal. No masses Extremities: atraumatic, no cyanosis. No edema. No clubbing Skin: No rashes or lesions. Or bruising Lymph: Cervical, supraclavicular normal. Neurologic: left lower extremity weakness  Lab Results Recent Labs    05/29/22 0531 05/29/22 0937 05/30/22 0830 05/30/22 1520 05/31/22 0420  WBC 24.4*  --   --   --  21.7*  HGB 7.7*   < >  --  8.3* 8.4*  HCT 23.4*   < >  --  25.6* 25.8*  NA 139  --  135  --  136  K 3.3*  --  3.6  --  3.3*  CL 107  --  105  --  102  CO2 26  --  26  --  28  BUN 64*  --  41*  --  20  CREATININE 1.53*   < > 1.28*  --  1.15   < > = values in this interval not displayed.   Liver Panel Recent Labs    05/30/22 0830   PROT 5.4*  ALBUMIN 2.0*  AST 22  ALT 19  ALKPHOS 50  BILITOT 0.6       Assessment/Plan: Low back pain with radiation down both legs Left leg more weak than right Has underlying degenerative spine disease and after a recent LESI the pain is worse Leucocytosis Staph  capitis bacteremia Concern for early discitis/epidural abscess- repeat CT with contrast no signs of infection Persistent ;leucocytosis   repeat blood culture- NG 2 d echo>no vegetation ? Tee. May not be needed if we decide to treat with 6 weeks of IV antibiotic Pt has spine stimulator and that prevents from getting MRI      AKI on CKD- combination of infection, NSAID use, dehydration and BPH causing retention post void bladder scan had 900cc of urine and staright cath was 1600cc Has foley cath Had some LUTS- urine culture no growth Also started on cefepime- Dc cefepime and change to cefazolin to treat the bacteremia    GI bleed- underwent endoscopy- 2 non bleeding duodenal ulcer, and 1 non bleeding gastric ulcer seen On PPI  Leucocytosis persist   Hypothyrodism on synthorid   CAD s/p stent B/l TKA   H/o endovascular stent for aneurysm    Discussed with neurosurgeon yesterday - possible removal of the stimulator and then MRI

## 2022-05-31 NOTE — Progress Notes (Addendum)
PROGRESS NOTE    Anthony [name]  RSW:[id number] DOB: [birth year] DOA: 05/22/2022  PCP: Ria Bush, MD   Brief Narrative: This 86 yrs old male with medical history significant of atrial flutter,  status post ablation, chronic back pain status post spine stimulator which is malfunctioning, hypertension, hyperlipidemia, hypothyroidism, obesity, obstructive sleep apnea on CPAP comes to the emergency room with complaints of lower back pain radiating bilaterally to both legs,  going down along with increased urinary frequency.  Patient is found to have Staph aureus bacteremia, back pain is concerning for epidural abscess/early discitis.  Infectious diseases consulted, recommended IV antibiotics.  Neurosurgery was consulted,  states No neurosurgical intervention needed, recommended IV steroids and IV antibiotics.  Echo: no evidence of vegetation.  LVEF 09%, grade 1 diastolic dysfunction. CT lumbar spine with no evidence of abscess or discitis or osteomyelitis.  Patient continued to have leukocytosis despite being on Nafcillin, UA with positive LE, started on cefepime.  ID recommended 6 weeks of IV antibiotics if we are not pursuing TEE.  Patient has 2 black bowel movements with drop in hemoglobin.  GI is consulted.  Patient is scheduled to have EGD today.  Assessment & Plan:   Principal Problem:   Sepsis ([place]) Active Problems:   Coag negative Staphylococcus bacteremia   Gastrointestinal hemorrhage with melena  Severe sepsis POA:  Staph Capitis bacteremia: He was found to have tachycardia , elevated white count of 21K, lactic acid of 2.0, increased creatinine to 2.6 from baseline 1.3. Lactic acid normalized, 1.2 > 2.0.  It could be UTI, back pain with possible discitis given recent procedure. Infectious disease Dr. Delaine Lame consulted.  Antibiotics changed to daptomycin. Repeat blood cultures: No growth so far, Antibiotics changed to nafcillin.  Echo with no evidence of vegetation, CT lumbar  spine: no evidence of discitis, abscess or osteomyelitis. WBC count continues to trend up.  UA positive LE, chest x-ray unremarkable. Cefepime added for persistent leukocytosis. Discussed with ID,  recommended to discontinue nafcillin and consider TEE. ID recommended 6 weeks of IV antibiotics if we are not pursuing TEE. Currently on Ancef per ID.  Chronic back pain with severe degenerative arthritis: Chronic pain /history of spinal cord pain stimulator-- malfunctioning. Patient follows with pain clinic Dr. Zollie Scale as an outpatient, He recently underwent lumbar spinal steroid injection on 20 September 23 CRP 42.2, ESR 10.0 Neurosurgery consultation with Dr. Cari Caraway completed, given increase in weakness more on the left and right-- recommends brace and follow-up outpatient for malfunctioning pain stimulator. Unable to do MRI given stimulator, needs better imaging. Continue PRN PO and IV pain meds Continue muscle relaxants-- patient is on gabapentin. CT lumbar spine no evidence of discitis, abscess or osteomyelitis.   AKI on CKD stage IIIb:  Baseline creatinine 1.3 Presented  with creatinine of 2.62--2.18 >2.25>1.42 > 1.24 >1.34>1.53>1.28> 115 Continue IV fluids, monitor input output, Avoid nephrotoxic agents AKI resolved.  Serum creatinine at baseline.  Hypokalemia:  Replace oral KCl for K3.3 today.  Monitor BMP and replace as needed.   Leukocytosis: Likely reactive due to steroids versus to infection. --WBC 21K-18K-19.1- 20.9>25.6 Patient remained on daptomycin for staph bacteremia. Antibiotic changed to nafcillin. UA+ LE Added cefepime for gram-negative coverage. As per ID, nafcillin discontinued, continue cefepime for now.   Elevated troponin: Likely demand ischemia in the setting of sepsis. Patient denies any chest pain, dizziness, palpitations. He has hx.  of CAD with stent placed in LAD few years ago He has hx. of atrial flutter status post ablation  in the past. Patient  follows with [facility] cardiology Aspirin and Plavix is on hold.  Continue statins.   Hypothyroidism Continue Synthroid.   Hyperlipidemia  Continue Zetia.  Hypokalemia: Replaced.  Continue to monitor   Upper GI bleed: Acute blood loss anemia: Patient had 2 dark black stools. Hb trended down 10.7>9.7>7.7>8.5>7.0.Marland KitchenMarland Kitchen Now stable 8.3, 8.4 GI consulted.   RBC tagged scan negative for acute bleeding. Monitor H&H every 12 hours.   Transfused 1 unit PRBC. Hold Plavix and aspirin.   Patient underwent EGD on 10/3 which showed gastric and duodenal ulcers all nonbleeding with clean ulcer base (Forrest class III)  DVT prophylaxis:SCDs Code Status: DNR Family Communication: None for his at bedside on rounds, will attempt to call Disposition Plan:   Status is: Inpatient Remains inpatient appropriate because:  On IV antibiotics for bacteremia with ongoing evaluation.  Potentially surgery this admission. PT recommended SNF.   Patient is not medically clear.    Consultants:  Neurosurgery Infectious disease  Procedures: None  Antimicrobials: Daptomycin, > nafcillin > Cefepime > Ancef  Subjective: Patient was up in recliner chair when seen on rounds today.  No acute events reported overnight.  He reports ongoing severe back pain, states it has not been adequately controlled even with medication so far.  No other acute complaints at this time.  Reports inability to get comfortable secondary to back pain.  Objective: Vitals:   05/30/22 1549 05/30/22 2148 05/31/22 0533 05/31/22 0822  BP: (!) 142/63 (!) 157/91 (!) 146/77 (!) 144/71  Pulse: 92 97 94 85  Resp: '16 18 18 ' (!) 22  Temp: 99 F (37.2 C) 99 F (37.2 C) 97.9 F (36.6 C) 98.7 F (37.1 C)  TempSrc:  Oral    SpO2: 91% 95% 96% 98%  Weight:      Height:        Intake/Output Summary (Last 24 hours) at 05/31/2022 1329 Last data filed at 05/31/2022 0954 Gross per 24 hour  Intake 580 ml  Output 2900 ml  Net -2320 ml   Filed  Weights   05/30/22 1028  Weight: 122.5 kg    Examination:  General exam: awake, alert, appeared uncomfortable due to pain HEENT: moist mucus membranes, hearing grossly normal  Respiratory system: CTAB, no wheezes, rales or rhonchi, normal respiratory effort. Cardiovascular system: normal S1/S2, RRR, no pedal edema.   Gastrointestinal system: soft, NT, ND Central nervous system: A&O x3. no gross focal neurologic deficits, normal speech Extremities: moves all, no edema, normal tone Skin: dry, intact, normal temperature Psychiatry: normal mood, congruent affect, judgement and insight appear normal     Data Reviewed:   Notable labs --- potassium 3.3, glucose 134, creatinine normalized 1.15 from 1.28, hemoglobin stable 8.4 from 8.3 yesterday afternoon, platelets 456  Microbiology ---  Blood cultures from 9/25 growing Staph capitis. Repeat blood cultures from 9/29 are no growth at 5 days. Urine culture from 10/1 is no growth.  Echocardiogram and CT myelogram completed -- no evidence of vegetation or discitis or osteomyelitis or abscess.  ID recommended 6 weeks of IV antibiotic if not pursuing TEE.    EGD on 10/3 --- notable for multiple nonbleeding ulcers, one in the stomach and 2 in the duodenum.    LOS: 9 days    Time spent: 40 mins    Ezekiel Slocumb, DO [facility]   If 7PM-7AM, please contact night-coverage

## 2022-05-31 NOTE — Progress Notes (Signed)
Occupational Therapy Treatment Patient Details Name: Anthony [name] MRN: [id number] DOB: 1936-01-14 Today's Date: 05/31/2022   History of present illness This 86 yrs old male with medical history significant of atrial flutter, status post ablation, chronic back pain status post stent spine stimulator which is malfunctioning, hypertension, hyperlipidemia, hypothyroidism, obesity, obstructive sleep apnea on CPAP comes to the emergency room with complaints of lower back pain radiating bilaterally to both legs,  going down along with increased urinary frequency. Patient is found to have Staph aureus bacteremia, back pain is concerning for epidural abscess/early discitis.  Infectious diseases consulted recommended IV antibiotics. Neurosurgery is consulted states no neurosurgical intervention needed, recommended IV steroids and IV antibiotics. If additional work-up is needed could consider MRI versus CT myelogram.   OT comments  Upon entering session, pt resting in bed and agreeable to OT. Tx session targeted improving functional balance during ADL tasks in preparation for improved functional transfers and ADL task completion. Pt required Max A for supine to sit. Able to sit EOB with Min A initially for static sitting balance improving to Min guard. Pt then completed stand pivot transfer to recliner with Max A +2 using RW. Pt's activity tolerance limited by low back pain (5/10). RN present to assist with mobility and give pain meds. Pt left sitting in recliner with all needs in reach. Pt is making progress toward goal completion. D/C recommendation remains appropriate. OT will continue to follow acutely.     Recommendations for follow up therapy are one component of a multi-disciplinary discharge planning process, led by the attending physician.  Recommendations may be updated based on patient status, additional functional criteria and insurance authorization.    Follow Up Recommendations  Skilled  nursing-short term rehab (<3 hours/day)    Assistance Recommended at Discharge Frequent or constant Supervision/Assistance  Patient can return home with the following  A lot of help with walking and/or transfers;A lot of help with bathing/dressing/bathroom;Assistance with cooking/housework;Assist for transportation;Help with stairs or ramp for entrance   Equipment Recommendations  Other (comment) (defer to next venue of care)    Recommendations for Other Services      Precautions / Restrictions Precautions Precautions: Fall Restrictions Weight Bearing Restrictions: No       Mobility Bed Mobility Overal bed mobility: Needs Assistance Bed Mobility: Supine to Sit     Supine to sit: Max assist     General bed mobility comments: attempting to instruct pt in log roll technique to prevent worsening back pain, however, had difficulty reaching over for bed rail with LUE requiring increased assistance.    Transfers Overall transfer level: Needs assistance Equipment used: Rolling walker (2 wheels) Transfers: Bed to chair/wheelchair/BSC   Stand pivot transfers: +2 physical assistance, Max assist         General transfer comment: VC for hand placement     Balance Overall balance assessment: Needs assistance Sitting-balance support: Feet supported Sitting balance-Leahy Scale: Fair Sitting balance - Comments: Min A initially to maintain static sitting balance, improved to Min guard. VC to scoot hips forward at EOB   Standing balance support: Bilateral upper extremity supported, Reliant on assistive device for balance Standing balance-Leahy Scale: Poor Standing balance comment: pt unable to come to fully upright standing position depsite VC to look straight ahead, have a wider BOS, and bring his bottom forward to be in line with his hips.  ADL either performed or assessed with clinical judgement   ADL Overall ADL's : Needs  assistance/impaired     Grooming: Oral care;Set up;Sitting;Supervision/safety               Lower Body Dressing: Maximal assistance;Sitting/lateral leans Lower Body Dressing Details (indicate cue type and reason): for socks 2/2 low back pain                    Extremity/Trunk Assessment Upper Extremity Assessment Upper Extremity Assessment: Generalized weakness   Lower Extremity Assessment Lower Extremity Assessment: Generalized weakness        Vision Patient Visual Report: No change from baseline     Perception     Praxis      Cognition Arousal/Alertness: Awake/alert Behavior During Therapy: Impulsive Overall Cognitive Status: Within Functional Limits for tasks assessed                                 General Comments: Low frustration tolerance 2/2 low back pain, stating "just get out of my way" and "I'll tell you when I want you to help". Pt attempting to stand before OT had chair set up in order to safely complete transfer. Difficulty keeping eyes open at beginning of session due to pain and discomfort with bed mobility        Exercises      Shoulder Instructions       General Comments      Pertinent Vitals/ Pain       Pain Assessment Pain Assessment: 0-10 Pain Score: 5  Pain Location: low back Pain Descriptors / Indicators: Grimacing, Guarding, Discomfort, Sharp, Shooting Pain Intervention(s): Limited activity within patient's tolerance, Monitored during session, Repositioned, RN gave pain meds during session  Home Living                                          Prior Functioning/Environment              Frequency  Min 2X/week        Progress Toward Goals  OT Goals(current goals can now be found in the care plan section)  Progress towards OT goals: Progressing toward goals  Acute Rehab OT Goals Patient Stated Goal: reduce pain OT Goal Formulation: With patient Time For Goal Achievement:  06/07/22 Potential to Achieve Goals: Good  Plan Discharge plan remains appropriate;Frequency remains appropriate    Co-evaluation                 AM-PAC OT "6 Clicks" Daily Activity     Outcome Measure   Help from another person eating meals?: None Help from another person taking care of personal grooming?: A Little Help from another person toileting, which includes using toliet, bedpan, or urinal?: A Lot Help from another person bathing (including washing, rinsing, drying)?: A Lot Help from another person to put on and taking off regular upper body clothing?: A Lot Help from another person to put on and taking off regular lower body clothing?: A Lot 6 Click Score: 15    End of Session Equipment Utilized During Treatment: Rolling walker (2 wheels)  OT Visit Diagnosis: Pain;Muscle weakness (generalized) (M62.81);Unsteadiness on feet (R26.81) Pain - part of body:  (low back)   Activity Tolerance Patient limited by pain   Patient Left in chair;with call  bell/phone within reach;with chair alarm set   Nurse Communication Mobility status        Time: 5872-7618 OT Time Calculation (min): 27 min  Charges: OT General Charges $OT Visit: 1 Visit OT Treatments $Self Care/Home Management : 23-37 mins  [facility] MS, OTR/L ascom [phone]  05/31/22, 1:12 PM

## 2022-06-01 DIAGNOSIS — B957 Other staphylococcus as the cause of diseases classified elsewhere: Secondary | ICD-10-CM | POA: Diagnosis not present

## 2022-06-01 DIAGNOSIS — R7881 Bacteremia: Secondary | ICD-10-CM | POA: Diagnosis not present

## 2022-06-01 LAB — SEDIMENTATION RATE: Sed Rate: 127 mm/hr — ABNORMAL HIGH (ref 0–20)

## 2022-06-01 LAB — BASIC METABOLIC PANEL
Anion gap: 6 (ref 5–15)
BUN: 17 mg/dL (ref 8–23)
CO2: 27 mmol/L (ref 22–32)
Calcium: 9.6 mg/dL (ref 8.9–10.3)
Chloride: 103 mmol/L (ref 98–111)
Creatinine, Ser: 1.09 mg/dL (ref 0.61–1.24)
GFR, Estimated: >60 mL/min (ref >60)
Glucose, Bld: 174 mg/dL — ABNORMAL HIGH (ref 70–99)
Potassium: 3.5 mmol/L (ref 3.5–5.1)
Sodium: 136 mmol/L (ref 135–145)

## 2022-06-01 LAB — CBC
HCT: 25.6 % — ABNORMAL LOW (ref 39.0–52.0)
Hemoglobin: 8.3 g/dL — ABNORMAL LOW (ref 13.0–17.0)
MCH: 30.4 pg (ref 26.0–34.0)
MCHC: 32.4 g/dL (ref 30.0–36.0)
MCV: 93.8 fL (ref 80.0–100.0)
Platelets: 503 10*3/uL — ABNORMAL HIGH (ref 150–400)
RBC: 2.73 MIL/uL — ABNORMAL LOW (ref 4.22–5.81)
RDW: 15.2 % (ref 11.5–15.5)
WBC: 19.9 10*3/uL — ABNORMAL HIGH (ref 4.0–10.5)
nRBC: 0 % (ref 0.0–0.2)

## 2022-06-01 LAB — C-REACTIVE PROTEIN: CRP: 14.8 mg/dL — ABNORMAL HIGH (ref <1.0)

## 2022-06-01 LAB — MAGNESIUM: Magnesium: 2.1 mg/dL (ref 1.7–2.4)

## 2022-06-01 MED ORDER — MORPHINE SULFATE (PF) 2 MG/ML IV SOLN
2.0000 mg | INTRAVENOUS | Status: DC | PRN
  Administered 2022-06-03 – 2022-06-07 (×3): 2 mg via INTRAVENOUS
  Filled 2022-06-01 (×3): qty 1

## 2022-06-01 MED ORDER — OXYCODONE HCL 5 MG PO TABS
5.0000 mg | ORAL_TABLET | ORAL | Status: DC | PRN
  Administered 2022-06-02 – 2022-06-07 (×14): 10 mg via ORAL
  Administered 2022-06-08 (×3): 5 mg via ORAL
  Filled 2022-06-01: qty 1
  Filled 2022-06-01 (×2): qty 2
  Filled 2022-06-01: qty 1
  Filled 2022-06-01 (×11): qty 2
  Filled 2022-06-01: qty 1
  Filled 2022-06-01: qty 2

## 2022-06-01 MED ORDER — METHOCARBAMOL 500 MG PO TABS
500.0000 mg | ORAL_TABLET | Freq: Three times a day (TID) | ORAL | Status: DC
  Administered 2022-06-01: 500 mg via ORAL
  Filled 2022-06-01 (×2): qty 1

## 2022-06-01 MED ORDER — ACETAMINOPHEN 500 MG PO TABS
1000.0000 mg | ORAL_TABLET | Freq: Three times a day (TID) | ORAL | Status: DC
  Administered 2022-06-01 – 2022-06-08 (×20): 1000 mg via ORAL
  Filled 2022-06-01 (×20): qty 2

## 2022-06-01 NOTE — Progress Notes (Signed)
Patient has home CPAP at bedside. Equipment appears to be in proper working condition, no loose or frayed cords. Plugged into red outlet. Patient states no further assistance is needed, self places and manages.

## 2022-06-01 NOTE — Plan of Care (Signed)

## 2022-06-01 NOTE — Progress Notes (Signed)
I have reviewed this patient's condition with the patient, his son (a local primary care physician), and his treating hospitalist and infectious disease specialist.  Given his continued back pain and limited mobility as well as left leg weakness, we all feel it is prudent to move forward with removal of the spinal cord stimulator.  He currently has a nonfunctional spinal cord stimulator.  Because of this, he is unable to get an MRI scan as the device cannot be put in MRI conditional mode.  Thus, the only way to obtain additional imaging to evaluate whether he has any significant mass lesion is to remove his spinal cord stimulator in its entirety.  After its removal, he will be able to get a lumbar spine MRI with and without contrast to establish whether additional surgical decompression is prudent.

## 2022-06-01 NOTE — Care Management Important Message (Signed)
Important Message  Patient Details  Name: Anthony [name] MRN: [id number] Date of Birth: May 31, 1936   Medicare Important Message Given:  Yes     [name] 06/01/2022, 11:30 AM

## 2022-06-01 NOTE — Progress Notes (Signed)
Occupational Therapy Treatment Patient Details Name: Anthony [name] MRN: [id number] DOB: [birth year] Today's Date: 06/01/2022   History of present illness This 86 yrs old male with medical history significant of atrial flutter, status post ablation, chronic back pain status post stent spine stimulator which is malfunctioning, hypertension, hyperlipidemia, hypothyroidism, obesity, obstructive sleep apnea on CPAP comes to the emergency room with complaints of lower back pain radiating bilaterally to both legs,  going down along with increased urinary frequency. Patient is found to have Staph aureus bacteremia, back pain is concerning for epidural abscess/early discitis.  Infectious diseases consulted recommended IV antibiotics. Neurosurgery is consulted states no neurosurgical intervention needed, recommended IV steroids and IV antibiotics. If additional work-up is needed could consider MRI versus CT myelogram.   OT comments  Pt seen for brief OT tx this date, limited by low to middle back pain. Pt endorsing 10/10 low to middle back pain at start of session. Pt noted to be leaning to R side. Encouraged pt to assist with adjusting bed positioning to see whether it helped improve his pain. Pt required MAX A to attempt but ultimately unable to tolerate repositioning. Pt educated in aspiration risk with being so reclined and attempting to take sip of his drink. Pt verbalized understanding but continued to decline repositioning. Assisted pt with placing CPAP mask for proper fit. Pt continues to benefit from skilled OT services. Continue to recommend SNF.    Recommendations for follow up therapy are one component of a multi-disciplinary discharge planning process, led by the attending physician.  Recommendations may be updated based on patient status, additional functional criteria and insurance authorization.    Follow Up Recommendations  Skilled nursing-short term rehab (<3 hours/day)    Assistance  Recommended at Discharge Frequent or constant Supervision/Assistance  Patient can return home with the following  A lot of help with walking and/or transfers;A lot of help with bathing/dressing/bathroom;Assistance with cooking/housework;Assist for transportation;Help with stairs or ramp for entrance   Equipment Recommendations  Other (comment) (defer to next venue)    Recommendations for Other Services      Precautions / Restrictions Precautions Precautions: Fall Restrictions Weight Bearing Restrictions: No       Mobility Bed Mobility Overal bed mobility: Needs Assistance             General bed mobility comments: MAX A to attempt repositioning but pt too painful    Transfers                         Balance                                           ADL either performed or assessed with clinical judgement   ADL Overall ADL's : Needs assistance/impaired                                            Extremity/Trunk Assessment              Vision       Perception     Praxis      Cognition Arousal/Alertness: Awake/alert Behavior During Therapy: WFL for tasks assessed/performed Overall Cognitive Status: No family/caregiver present to determine baseline cognitive functioning  Exercises Other Exercises Other Exercises: Pt assisted with CPAP machine    Shoulder Instructions       General Comments      Pertinent Vitals/ Pain       Pain Assessment Pain Assessment: 0-10 Pain Score: 10-Worst pain ever Pain Location: low to mid-back Pain Descriptors / Indicators: Grimacing, Guarding, Discomfort, Sharp, Shooting Pain Intervention(s): Limited activity within patient's tolerance, Monitored during session, Repositioned, Patient requesting pain meds-RN notified  Home Living                                          Prior  Functioning/Environment              Frequency  Min 2X/week        Progress Toward Goals  OT Goals(current goals can now be found in the care plan section)  Progress towards OT goals: OT to reassess next treatment  Acute Rehab OT Goals Patient Stated Goal: reduce pain OT Goal Formulation: With patient Time For Goal Achievement: 06/07/22 Potential to Achieve Goals: Good  Plan Discharge plan remains appropriate;Frequency remains appropriate    Co-evaluation                 AM-PAC OT "6 Clicks" Daily Activity     Outcome Measure   Help from another person eating meals?: None Help from another person taking care of personal grooming?: A Little Help from another person toileting, which includes using toliet, bedpan, or urinal?: A Lot Help from another person bathing (including washing, rinsing, drying)?: A Lot Help from another person to put on and taking off regular upper body clothing?: A Lot Help from another person to put on and taking off regular lower body clothing?: A Lot 6 Click Score: 15    End of Session    OT Visit Diagnosis: Pain;Muscle weakness (generalized) (M62.81);Unsteadiness on feet (R26.81) Pain - part of body:  (middle to low back)   Activity Tolerance Patient limited by pain   Patient Left in bed;with call bell/phone within reach;with bed alarm set   Nurse Communication Patient requests pain meds        Time: 6415-8309 OT Time Calculation (min): 8 min  Charges: OT General Charges $OT Visit: 1 Visit OT Treatments $Therapeutic Activity: 8-22 mins  Ardeth Perfect., MPH, MS, OTR/L ascom [phone] 06/01/22, 4:10 PM

## 2022-06-01 NOTE — Progress Notes (Signed)
PROGRESS NOTE    BJ MORLOCK  WUJ:[id number] DOB: 14-Feb-1936 DOA: 05/22/2022  PCP: Ria Bush, MD   Brief Narrative: This 86 yrs old male with medical history significant of atrial flutter,  status post ablation, chronic back pain status post spine stimulator which is malfunctioning, hypertension, hyperlipidemia, hypothyroidism, obesity, obstructive sleep apnea on CPAP comes to the emergency room with complaints of lower back pain radiating bilaterally to both legs,  going down along with increased urinary frequency.  Patient is found to have Staph aureus bacteremia, back pain is concerning for epidural abscess/early discitis.  Infectious diseases consulted, recommended IV antibiotics.  Neurosurgery was consulted,  states No neurosurgical intervention needed, recommended IV steroids and IV antibiotics.  Echo: no evidence of vegetation.  LVEF 95%, grade 1 diastolic dysfunction. CT lumbar spine with no evidence of abscess or discitis or osteomyelitis.  Patient continued to have leukocytosis despite being on Nafcillin, UA with positive LE, started on cefepime.  ID recommended 6 weeks of IV antibiotics if we are not pursuing TEE.  Patient has 2 black bowel movements with drop in hemoglobin.  GI is consulted.  Patient is scheduled to have EGD today.  Assessment & Plan:   Principal Problem:   Sepsis ([place]) Active Problems:   Coag negative Staphylococcus bacteremia   Gastrointestinal hemorrhage with melena  Severe sepsis POA:  Staph Capitis bacteremia: He was found to have tachycardia , elevated white count of 21K, lactic acid of 2.0, increased creatinine to 2.6 from baseline 1.3. Lactic acid normalized, 1.2 > 2.0.  Infectious disease Dr. Delaine Lame consulted.  Antibiotics changed to daptomycin. Repeat blood cultures: No growth  Echo with no evidence of vegetation CT lumbar spine: no evidence of discitis, abscess or osteomyelitis. WBC count remains elevated.   UA positive LE no UTI  sx's, chest x-ray unremarkable. Cefepime added for persistent leukocytosis. Discussed with ID, resumed of nafcillin  ID recommended 6 weeks of IV antibiotics if we are not pursuing TEE. Currently on Ancef.  Chronic back pain with severe degenerative arthritis: Chronic pain /history of spinal cord pain stimulator-- malfunctioning. Patient follows at pain clinic with Dr. Holley Raring He recently underwent lumbar spinal steroid injection on 05/17/2022.0 CRP 42.2, ESR 10.0 Neurosurgery consulting - Dr. Cari Caraway Given increase in weakness more on the left and right-- needs further evaluation with MRI.  Plan is for removal of spinal stimulator, followed by lumbar MRI w/wo contrast. Started scheduled Tylenol 1000 mg TID Continue PRN PO and IV pain meds  Trial of low dose Robaxin today was too sedating for pt. Continue muscle relaxants-- patient is on gabapentin. CT lumbar spine no evidence of discitis, abscess or osteomyelitis.   AKI on CKD stage IIIb:  Baseline creatinine 1.3 Presented  with creatinine of 2.62--2.18 >2.25>1.42 > 1.24 >1.34>1.53>1.28> 1.15>1.09 Now off IV fluids Avoid nephrotoxic agents AKI resolved.  Serum creatinine at baseline.  Hypokalemia:  Replaced on 10/4 for K3.3.   Monitor BMP and replace as needed.   Leukocytosis: Likely reactive due to steroids versus to infection. --WBC remains elevated: 19.9k from 21.7k Patient remained on daptomycin for staph bacteremia. Antibiotic changed to nafcillin. UA+ LE Added cefepime for gram-negative coverage. As per ID, nafcillin discontinued, continue cefepime for now.   Elevated troponin: Likely demand ischemia in the setting of sepsis. Patient denies any chest pain, dizziness, palpitations. He has hx.  of CAD with stent placed in LAD few years ago He has hx. of atrial flutter status post ablation in the past. Patient follows with [facility]  cardiology Aspirin and Plavix is on hold.  Continue statins.   Reactive  thrombocytosis Monitor CBC  Hypothyroidism Continue Synthroid.   Hyperlipidemia  Continue Zetia.  Hypokalemia: Replaced.  Continue to monitor   Upper GI bleed: Acute blood loss anemia: Patient had 2 dark black stools. Hb trended down 10.7>9.7>7.7>8.5>7.0.Marland KitchenMarland Kitchen  Now stable Hbg 8.3-8.4 GI consulted.   RBC tagged scan negative for acute bleeding. Monitor H&H every 12 hours.   Transfused 1 unit PRBC. Hold Plavix and aspirin.   Patient underwent EGD on 10/3 which showed gastric and duodenal ulcers all nonbleeding with clean ulcer base (Forrest class III)  DVT prophylaxis:SCDs Code Status: DNR Family Communication: Patient's son updated in hallway on the unit this afternoon.   Disposition Plan:   Status is: Inpatient Remains inpatient appropriate because:  On IV antibiotics for bacteremia with ongoing evaluation.  Spinal cord stimulator to be removed, followed by MRI for further evaluation of severe uncontrolled back pain. PT recommended SNF.      Consultants:  Neurosurgery Infectious disease  Procedures: None  Antimicrobials: Daptomycin, > nafcillin > Cefepime > Ancef  Subjective: Patient was sleeping semi-reclined in the bed, woke easily to voice.  Very drowsy and repeatedly falls back to sleep during conversation.  Says he has been sleeping through the pain today.  Noted some confusion, asking to go back to bed to rest.    Objective: Vitals:   05/31/22 1943 06/01/22 0432 06/01/22 0750 06/01/22 1534  BP: 132/77 (!) 152/76 (!) 149/82 132/67  Pulse: 100 96 79 99  Resp: '20 20 18 16  ' Temp: 98.5 F (36.9 C) 99.1 F (37.3 C) 99.7 F (37.6 C) 98.6 F (37 C)  TempSrc:    Oral  SpO2: 96% 97% 96% 96%  Weight:      Height:        Intake/Output Summary (Last 24 hours) at 06/01/2022 1859 Last data filed at 06/01/2022 1418 Gross per 24 hour  Intake 897 ml  Output 1250 ml  Net -353 ml   Filed Weights   05/30/22 1028 05/31/22 1334  Weight: 122.5 kg 109.1 kg     Examination:  General exam: sleeping, wakes easily but very drowsy, no acute distress, mildly confused HEENT: dry mucus membranes, hearing grossly normal  Respiratory system: CTAB, no wheezes, rales or rhonchi, normal respiratory effort. Cardiovascular system: normal S1/S2, RRR, no peripheral edema.   Gastrointestinal system: soft, NT, ND Central nervous system: oriented to person and place at least, no gross focal neurologic deficits, normal speech Skin: dry, intact, normal temperature Psychiatry: normal mood, congruent affect, judgement and insight appear normal     Data Reviewed:   Notable labs --- glucose 174, CRP 14.8, WBC 19.9 from 21.7, Hbg 8.3 from 8.4 stable, platelets 503k  Microbiology ---  Blood cultures from 9/25 growing Staph capitis. Repeat blood cultures from 9/29 - no growth at 5 days. Urine culture from 10/1 - no growth.  Echocardiogram and CT myelogram completed -- no evidence of vegetation, discitis, osteomyelitis or abscess.  ID recommended 6 weeks of IV antibiotic if not pursuing TEE.    EGD on 10/3 --- notable for multiple nonbleeding clean-based ulcers, one in the stomach and 2 in the duodenum.    LOS: 10 days    Time spent: 40 mins    Ezekiel Slocumb, DO [facility]   If 7PM-7AM, please contact night-coverage

## 2022-06-01 NOTE — Progress Notes (Addendum)
PT Cancellation Note  Patient Details Name: Anthony [name] MRN: [id number] DOB: 08-22-1936   Cancelled Treatment:     Several attempts made to treat pt this date. First attempt, pt was eating breakfast however in unsafe position. Author encouraged and assisted pt with elevation of head portion of bed however pt yells out in pain. Was only able to tolerate ~40 degrees elevation due to pain. On second attempt, pt sleeping requesting author return at later time.Will return next date to progress pt as able per tolerance. Overall cognition still a concern.   Willette Pa 06/01/2022, 5:56 PM

## 2022-06-02 DIAGNOSIS — R531 Weakness: Secondary | ICD-10-CM | POA: Diagnosis not present

## 2022-06-02 DIAGNOSIS — T85192S Other mechanical complication of implanted electronic neurostimulator (electrode) of spinal cord, sequela: Secondary | ICD-10-CM

## 2022-06-02 DIAGNOSIS — B957 Other staphylococcus as the cause of diseases classified elsewhere: Secondary | ICD-10-CM | POA: Diagnosis not present

## 2022-06-02 DIAGNOSIS — T85192A Other mechanical complication of implanted electronic neurostimulator (electrode) of spinal cord, initial encounter: Secondary | ICD-10-CM | POA: Diagnosis not present

## 2022-06-02 DIAGNOSIS — D72829 Elevated white blood cell count, unspecified: Secondary | ICD-10-CM | POA: Diagnosis not present

## 2022-06-02 DIAGNOSIS — K921 Melena: Secondary | ICD-10-CM | POA: Diagnosis not present

## 2022-06-02 DIAGNOSIS — R7881 Bacteremia: Secondary | ICD-10-CM | POA: Diagnosis not present

## 2022-06-02 LAB — CBC
HCT: 24.6 % — ABNORMAL LOW (ref 39.0–52.0)
Hemoglobin: 8.1 g/dL — ABNORMAL LOW (ref 13.0–17.0)
MCH: 31.2 pg (ref 26.0–34.0)
MCHC: 32.9 g/dL (ref 30.0–36.0)
MCV: 94.6 fL (ref 80.0–100.0)
Platelets: 560 10*3/uL — ABNORMAL HIGH (ref 150–400)
RBC: 2.6 MIL/uL — ABNORMAL LOW (ref 4.22–5.81)
RDW: 15.1 % (ref 11.5–15.5)
WBC: 16.4 10*3/uL — ABNORMAL HIGH (ref 4.0–10.5)
nRBC: 0 % (ref 0.0–0.2)

## 2022-06-02 LAB — BASIC METABOLIC PANEL
Anion gap: 4 — ABNORMAL LOW (ref 5–15)
BUN: 13 mg/dL (ref 8–23)
CO2: 32 mmol/L (ref 22–32)
Calcium: 9.7 mg/dL (ref 8.9–10.3)
Chloride: 100 mmol/L (ref 98–111)
Creatinine, Ser: 1.02 mg/dL (ref 0.61–1.24)
GFR, Estimated: >60 mL/min (ref >60)
Glucose, Bld: 145 mg/dL — ABNORMAL HIGH (ref 70–99)
Potassium: 3.4 mmol/L — ABNORMAL LOW (ref 3.5–5.1)
Sodium: 136 mmol/L (ref 135–145)

## 2022-06-02 LAB — HEMOGLOBIN AND HEMATOCRIT, BLOOD
HCT: 25.4 % — ABNORMAL LOW (ref 39.0–52.0)
Hemoglobin: 8.3 g/dL — ABNORMAL LOW (ref 13.0–17.0)

## 2022-06-02 MED ORDER — POTASSIUM CHLORIDE CRYS ER 20 MEQ PO TBCR
40.0000 meq | EXTENDED_RELEASE_TABLET | ORAL | Status: AC
  Administered 2022-06-02 (×2): 40 meq via ORAL
  Filled 2022-06-02 (×2): qty 2

## 2022-06-02 NOTE — Progress Notes (Signed)
Physical Therapy Treatment Patient Details Name: Anthony [name] MRN: [id number] DOB: Oct 15, 1935 Today's Date: 06/02/2022   History of Present Illness This 86 yrs old male with medical history significant of atrial flutter, status post ablation, chronic back pain status post stent spine stimulator which is malfunctioning, hypertension, hyperlipidemia, hypothyroidism, obesity, obstructive sleep apnea on CPAP comes to the emergency room with complaints of lower back pain radiating bilaterally to both legs,  going down along with increased urinary frequency. Patient is found to have Staph aureus bacteremia, back pain is concerning for epidural abscess/early discitis.  Infectious diseases consulted recommended IV antibiotics. Neurosurgery is consulted states no neurosurgical intervention needed, recommended IV steroids and IV antibiotics. If additional work-up is needed could consider MRI versus CT myelogram.    PT Comments    PT/OT co-treat 2/2 to pt's limiting activity tolerance and need for +2 assistance for safety. He was able to appropriately answer orientation questions however some major cognitive concerns come to light throughout session. He likes to dictate session however was cooperative and willing to attempt desired task requested of him. Much improved abilities today versus previous few days. Was able to roll R to short sit with max assist. Sat EOB for a few minutes prior to standing 2 x EOB while progressing to taking steps to recliner. He had black/loose BM in bed and was unaware. Stood to have hygiene care with +2 assistance from elevated bed height. Vcs throughout for technique and safety. Required max assist to stand from lower recliner surface with second person close for safety. Unwilling to stay in recliner, requesting to return to bed. T conclusion of session, pt was in bed  and bed pan placed under him as requested. Author returned a short time later and pt unaware bedpan still under him  with unsuccessful BM attempt. Licensed conveyancer in room assisting him with application of his personal cpap machine. Pt will require extensive PT going forward. Highly recommend DC to SNF when cleared medically to maximize independence while decreasing caregiver burden.     Recommendations for follow up therapy are one component of a multi-disciplinary discharge planning process, led by the attending physician.  Recommendations may be updated based on patient status, additional functional criteria and insurance authorization.  Follow Up Recommendations  Skilled nursing-short term rehab (<3 hours/day)     Assistance Recommended at Discharge Frequent or constant Supervision/Assistance  Patient can return home with the following Two people to help with walking and/or transfers;A lot of help with bathing/dressing/bathroom;Assistance with cooking/housework;Direct supervision/assist for medications management;Direct supervision/assist for financial management;Assist for transportation;Help with stairs or ramp for entrance   Equipment Recommendations  None recommended by PT (defer to next level of care. Pt has back brace in room.)       Precautions / Restrictions Precautions Precautions: Fall Restrictions Weight Bearing Restrictions: No     Mobility  Bed Mobility Overal bed mobility: Needs Assistance Bed Mobility: Rolling, Sidelying to Sit, Supine to Sit, Sit to Sidelying, Sit to Supine Rolling: Mod assist Sidelying to sit: Max assist, +2 for safety/equipment Supine to sit: Max assist, +2 for safety/equipment Sit to supine: Max assist, +2 for safety/equipment Sit to sidelying: Max assist, +2 for safety/equipment General bed mobility comments: Pt does not want to perform log roll technique but eventually agreeble. Increased time and extensive asistance required to achieve EOB sitting and to return to supine from EOB short sit    Transfers Overall transfer level: Needs assistance Equipment used:  Rolling walker (2 wheels) Transfers:  Sit to/from Stand Sit to Stand: From elevated surface, Min assist, +2 physical assistance, Max assist (max assist from lower recliner surface)  Step pivot transfers: Mod assist, +2 physical assistance, Max assist    General transfer comment: Pt was able to stand EOB 2 x with +2 assistance for safety. From lower recliner surface, required max assist with author preventing knees from buckling + (+2) assistance for safety. Pt continues to be extremely high fall risk    Ambulation/Gait Ambulation/Gait assistance: +2 physical assistance, +2 safety/equipment, Min assist, Mod assist Gait Distance (Feet): 3 Feet Assistive device: Rolling walker (2 wheels) Gait Pattern/deviations: Step-to pattern, Trunk flexed Gait velocity: decreased  General Gait Details: Pt was able to take ~ 3-4 steps form EOB to/from bed<>chair. Vcs + increased time required +2 assistance for safety    Balance Overall balance assessment: Needs assistance Sitting-balance support: Feet supported, Bilateral upper extremity supported Sitting balance-Leahy Scale: Fair Sitting balance - Comments: pain limited   Standing balance support: Bilateral upper extremity supported, Reliant on assistive device for balance Standing balance-Leahy Scale: Poor Standing balance comment: High fall risk due to instability(slight knee buckling) and pt's poor overall safety awareness       Cognition Arousal/Alertness: Awake/alert Behavior During Therapy: WFL for tasks assessed/performed Overall Cognitive Status: No family/caregiver present to determine baseline cognitive functioning      General Comments: Pt was able to answeer orientation questions appropriately during beginning of session however throughout session cognition deficits come to light. He likes to dictate session progression against recommendation but eventually does perform desired task requested of him with increased time and cueing. Pt has  been and continues to be limited by pain however able to perform much more mobility, transfers, and even take steps this date.        Exercises Other Exercises Other Exercises: tolerated standing for ~2-19mn for pericare before requiring brief seated rest break prior to standing again and completing step pivot to the recliner        Pertinent Vitals/Pain Pain Assessment Pain Assessment: Faces Faces Pain Scale: Hurts whole lot Pain Location: back pain Pain Descriptors / Indicators: Grimacing, Guarding, Discomfort, Sharp, Shooting, Moaning Pain Intervention(s): Limited activity within patient's tolerance, Monitored during session, Premedicated before session, Repositioned, Heat applied     PT Goals (current goals can now be found in the care plan section) Acute Rehab PT Goals Patient Stated Goal: none stated Progress towards PT goals: Progressing toward goals (limited progress made due to pain)    Frequency    Min 2X/week      PT Plan Current plan remains appropriate    Co-evaluation PT/OT/SLP Co-Evaluation/Treatment: Yes Reason for Co-Treatment: Complexity of the patient's impairments (multi-system involvement);Necessary to address cognition/behavior during functional activity;For patient/therapist safety;To address functional/ADL transfers PT goals addressed during session: Mobility/safety with mobility;Balance;Proper use of DME;Strengthening/ROM OT goals addressed during session: ADL's and self-care;Proper use of Adaptive equipment and DME      AM-PAC PT "6 Clicks" Mobility   Outcome Measure  Help needed turning from your back to your side while in a flat bed without using bedrails?: A Lot Help needed moving from lying on your back to sitting on the side of a flat bed without using bedrails?: Total Help needed moving to and from a bed to a chair (including a wheelchair)?: Total Help needed standing up from a chair using your arms (e.g., wheelchair or bedside chair)?:  Total Help needed to walk in hospital room?: Total Help needed climbing 3-5 steps with  a railing? : Total 6 Click Score: 7    End of Session   Activity Tolerance: Patient limited by pain (Continues to be pain limited but much improved abiltiies today versus previous few days) Patient left: in bed;with call bell/phone within reach;with bed alarm set Nurse Communication: Mobility status PT Visit Diagnosis: Unsteadiness on feet (R26.81);Other abnormalities of gait and mobility (R26.89);Muscle weakness (generalized) (M62.81);Repeated falls (R29.6);Pain;Difficulty in walking, not elsewhere classified (R26.2) Pain - Right/Left: Left Pain - part of body: Leg     Time: 8295-6213 PT Time Calculation (min) (ACUTE ONLY): 25 min  Charges:  $Therapeutic Activity: 8-22 mins                     Julaine Fusi PTA 06/02/22, 3:24 PM

## 2022-06-02 NOTE — Progress Notes (Signed)
    Attending Progress Note  History: Anthony [name] is here for severe back pain, lower extremity weakness, sepsis.  06/02/22: After lack of improvement with antibiotics, his treating physicians feel it is most appropriate to move forward with removal of his stimulator.  Physical Exam: Vitals:   06/01/22 1953 06/02/22 0558  BP: 115/74 (!) 156/94  Pulse: 81 (!) 51  Resp: 18 18  Temp: 98.2 F (36.8 C) 98.4 F (36.9 C)  SpO2: 97% 95%    AA Ox3 CNI  Strength:5/5 throughout RLE  L IP 4/5, KE and HS 4+, DF/PF 4+  Continues to have pain down both legs with standing  Data:  Recent Labs  Lab 05/30/22 0830 05/31/22 0420 06/01/22 0454  NA 135 136 136  K 3.6 3.3* 3.5  CL 105 102 103  CO2 '26 28 27  '$ BUN 41* 20 17  CREATININE 1.28* 1.15 1.09  GLUCOSE 163* 134* 174*  CALCIUM 8.8* 9.3 9.6   Recent Labs  Lab 05/30/22 0830  AST 22  ALT 19  ALKPHOS 50     Recent Labs  Lab 05/31/22 0420 06/01/22 0454 06/02/22 0612  WBC 21.7* 19.9* 16.4*  HGB 8.4* 8.3* 8.1*  HCT 25.8* 25.6* 24.6*  PLT 456* 503* 560*   No results for input(s): "APTT", "INR" in the last 168 hours.        Assessment/Plan:  Anthony [name] is here with sepsis, severe back pain, weakness, and difficulty walking  We have discussed this case amongst Dr. Arbutus Ped, Dr. Delaine Lame, myself, the patient, and his son.  We are all of the opinion that he needs more information that is only obtainable with an MRI scan.  Thus, I have recommended removal of his spinal cord stimulator tomorrow.  This will be dependent on maintaining medical stability through the day given his history of GI bleed and anemia.  I discussed the planned procedure at length with the patient, including the risks, benefits, alternatives, and indications. The risks discussed include but are not limited to bleeding, infection, need for reoperation, spinal fluid leak, stroke, vision loss, anesthetic complication, coma, paralysis, and even death.  I also described in detail that improvement was not guaranteed.  The patient expressed understanding of these risks, and asked that we proceed with surgery. I described the surgery in layman's terms, and gave ample opportunity for questions, which were answered to the best of my ability.    Meade Maw MD, [facility] Department of Neurosurgery

## 2022-06-02 NOTE — Plan of Care (Signed)
  Problem: Health Behavior/Discharge Planning: Goal: Ability to manage health-related needs will improve Outcome: Progressing   

## 2022-06-02 NOTE — Progress Notes (Signed)
Occupational Therapy Treatment Patient Details Name: Anthony [name] MRN: [id number] DOB: 1936-03-02 Today's Date: 06/02/2022   History of present illness This 86 yrs old male with medical history significant of atrial flutter, status post ablation, chronic back pain status post stent spine stimulator which is malfunctioning, hypertension, hyperlipidemia, hypothyroidism, obesity, obstructive sleep apnea on CPAP comes to the emergency room with complaints of lower back pain radiating bilaterally to both legs,  going down along with increased urinary frequency. Patient is found to have Staph aureus bacteremia, back pain is concerning for epidural abscess/early discitis.  Infectious diseases consulted recommended IV antibiotics. Neurosurgery is consulted states no neurosurgical intervention needed, recommended IV steroids and IV antibiotics. If additional work-up is needed could consider MRI versus CT myelogram.   OT comments  Pt seen for OT and co-tx with PTA to optimize functional outcomes and address ADL transfers/mobility. Pt agreeable with encouragement, requiring moderate VC for sequencing for log roll technique, apprehensive of pain and initially wanting to perform "his way" without log roll but was convinced by therapists to try it. Pt required MAX A for bed mobility, primarily due to pain. Pt stood from elevated EOB with MIN A +2 with VC for hand placement/RW mgt and tolerated standing for ~2-19mn with heavy BUE support on RW with assist from PTA for standing while pt required MAX A for pericare - noted dark tar-like stool in bed. RN promptly notified. Pt requested to return to bed 2/2 pain. MOD-MAX A +2 to stand from lower recliner. Pt requesting bed pan urgently and declined BSC use. RN/NT notified of pt's progress, on bed pan, and of black tar like stool in initial BM discovered upon standing. Pt continues to benefit from skilled OT services.    Recommendations for follow up therapy are one  component of a multi-disciplinary discharge planning process, led by the attending physician.  Recommendations may be updated based on patient status, additional functional criteria and insurance authorization.    Follow Up Recommendations  Skilled nursing-short term rehab (<3 hours/day)    Assistance Recommended at Discharge Frequent or constant Supervision/Assistance  Patient can return home with the following  A lot of help with bathing/dressing/bathroom;Assistance with cooking/housework;Assist for transportation;Help with stairs or ramp for entrance;Two people to help with walking and/or transfers   Equipment Recommendations  Other (comment) (defer to next venue)    Recommendations for Other Services      Precautions / Restrictions Precautions Precautions: Fall Restrictions Weight Bearing Restrictions: No       Mobility Bed Mobility Overal bed mobility: Needs Assistance Bed Mobility: Supine to Sit     Supine to sit: Max assist, +2 for safety/equipment Sit to supine: Max assist, +2 for safety/equipment   General bed mobility comments: max VC for log roll sequencing    Transfers Overall transfer level: Needs assistance Equipment used: Rolling walker (2 wheels) Transfers: Sit to/from Stand, Bed to chair/wheelchair/BSC Sit to Stand: From elevated surface, Min assist, +2 physical assistance     Step pivot transfers: Mod assist, +2 physical assistance, Max assist     General transfer comment: from elevated EOB MIN A +2 with significant tactile assist on knees to support full upright positioning, MAX VC for sequencing with RW     Balance Overall balance assessment: Needs assistance Sitting-balance support: Feet supported, Bilateral upper extremity supported Sitting balance-Leahy Scale: Fair Sitting balance - Comments: pain limited   Standing balance support: Bilateral upper extremity supported, Reliant on assistive device for balance Standing balance-Leahy Scale:  Poor                             ADL either performed or assessed with clinical judgement   ADL Overall ADL's : Needs assistance/impaired                             Toileting- Clothing Manipulation and Hygiene: Maximal assistance;Sit to/from stand Toileting - Clothing Manipulation Details (indicate cue type and reason): heavy BUE support on RW with assist from PTA for standing while pt required MAX A for pericare - noted dark tar-like stool in bed. RN promptly notified.            Extremity/Trunk Assessment              Vision       Perception     Praxis      Cognition Arousal/Alertness: Awake/alert Behavior During Therapy: WFL for tasks assessed/performed Overall Cognitive Status: No family/caregiver present to determine baseline cognitive functioning                                 General Comments: orientation seems to waver during session, decreased immediate recall and delayed recall of information        Exercises Other Exercises Other Exercises: tolerated standing for ~2-70mn for pericare before requiring brief seated rest break prior to standing again and completing step pivot to the recliner    Shoulder Instructions       General Comments      Pertinent Vitals/ Pain       Pain Assessment Pain Assessment: Faces Faces Pain Scale: Hurts whole lot Pain Location: low to mid-back Pain Descriptors / Indicators: Grimacing, Guarding, Discomfort, Sharp, Shooting, Moaning Pain Intervention(s): Limited activity within patient's tolerance, Monitored during session, Repositioned, Premedicated before session, Heat applied  Home Living                                          Prior Functioning/Environment              Frequency           Progress Toward Goals  OT Goals(current goals can now be found in the care plan section)  Progress towards OT goals: Progressing toward goals  Acute Rehab  OT Goals Patient Stated Goal: reduce pain OT Goal Formulation: With patient Time For Goal Achievement: 06/07/22 Potential to Achieve Goals: Good  Plan Discharge plan remains appropriate;Frequency remains appropriate    Co-evaluation    PT/OT/SLP Co-Evaluation/Treatment: Yes Reason for Co-Treatment: For patient/therapist safety;To address functional/ADL transfers PT goals addressed during session: Mobility/safety with mobility;Proper use of DME;Balance OT goals addressed during session: ADL's and self-care;Proper use of Adaptive equipment and DME      AM-PAC OT "6 Clicks" Daily Activity     Outcome Measure   Help from another person eating meals?: None Help from another person taking care of personal grooming?: A Little Help from another person toileting, which includes using toliet, bedpan, or urinal?: A Lot Help from another person bathing (including washing, rinsing, drying)?: A Lot Help from another person to put on and taking off regular upper body clothing?: A Lot Help from another person to put on and taking off regular lower body  clothing?: A Lot 6 Click Score: 15    End of Session Equipment Utilized During Treatment: Rolling walker (2 wheels);Gait belt  OT Visit Diagnosis: Pain;Muscle weakness (generalized) (M62.81);Unsteadiness on feet (R26.81) Pain - part of body:  (low to mid back)   Activity Tolerance Patient limited by pain   Patient Left in bed;with call bell/phone within reach;with bed alarm set;Other (comment) (on bed pan per pt's request')   Nurse Communication Mobility status;Other (comment) (NT - on bed pan; RN - black tar like stools)        Time: 2703-5009 OT Time Calculation (min): 25 min  Charges: OT General Charges $OT Visit: 1 Visit OT Treatments $Self Care/Home Management : 8-22 mins  Ardeth Perfect., MPH, MS, OTR/L ascom [phone] 06/02/22, 1:50 PM

## 2022-06-02 NOTE — Progress Notes (Signed)
PROGRESS NOTE    Anthony [name]  LDJ:[id number] DOB: 11/14/1935 DOA: 05/22/2022  PCP: Ria Bush, MD   Brief Narrative: This 86 yrs old male with medical history significant of atrial flutter,  status post ablation, chronic back pain status post spine stimulator which is malfunctioning, hypertension, hyperlipidemia, hypothyroidism, obesity, obstructive sleep apnea on CPAP comes to the emergency room with complaints of lower back pain radiating bilaterally to both legs,  going down along with increased urinary frequency.  Patient is found to have Staph aureus bacteremia, back pain is concerning for epidural abscess/early discitis.  Infectious diseases consulted, recommended IV antibiotics.  Neurosurgery was consulted,  states No neurosurgical intervention needed, recommended IV steroids and IV antibiotics.  Echo: no evidence of vegetation.  LVEF 03%, grade 1 diastolic dysfunction. CT lumbar spine with no evidence of abscess or discitis or osteomyelitis.  Patient continued to have leukocytosis despite being on Nafcillin, UA with positive LE, started on cefepime.  ID recommended 6 weeks of IV antibiotics if we are not pursuing TEE.  Patient has 2 black bowel movements with drop in hemoglobin.  GI is consulted.  Patient is scheduled to have EGD today.  Assessment & Plan:   Principal Problem:   Sepsis ([place]) Active Problems:   Coag negative Staphylococcus bacteremia   Gastrointestinal hemorrhage with melena   Failed spinal cord stimulator, sequela   Weakness  Severe sepsis POA:  Staph Capitis bacteremia: He was found to have tachycardia , elevated white count of 21K, lactic acid of 2.0, increased creatinine to 2.6 from baseline 1.3. Lactic acid normalized, 1.2 > 2.0.  Infectious disease Dr. Delaine Lame consulted.  Antibiotics changed to daptomycin. Repeat blood cultures: No growth  Echo with no evidence of vegetation CT lumbar spine: no evidence of discitis, abscess or osteomyelitis. WBC  count remains elevated.   UA positive LE no UTI sx's, chest x-ray unremarkable. Cefepime added for persistent leukocytosis. Discussed with ID, resumed of nafcillin  ID recommended 6 weeks of IV antibiotics if we are not pursuing TEE. Currently on Ancef.  Chronic back pain with severe degenerative arthritis: Chronic pain /history of spinal cord pain stimulator-- malfunctioning. Patient follows at pain clinic with Dr. Holley Raring He recently underwent lumbar spinal steroid injection on 05/17/2022.0 CRP 42.2, ESR 10.0 Neurosurgery consulting - Dr. Cari Caraway Given increase in weakness more on the left and right-- needs further evaluation with MRI.   Plan is for removal of spinal stimulator tomororw, followed by lumbar MRI w/wo contrast. Started scheduled Tylenol 1000 mg TID Continue PRN PO and IV pain meds  Trial of low dose Robaxin was too sedating for pt - d/c'd Continue gabapentin. CT lumbar spine no evidence of discitis, abscess or osteomyelitis.   AKI on CKD stage IIIb:  Baseline creatinine 1.3 Presented  with creatinine of 2.62--2.18 >2.25>1.42 > 1.24 >1.34>1.53>1.28> 1.15>1.09>1.02 Now off IV fluids Avoid nephrotoxic agents AKI resolved.  Serum creatinine at baseline.  Hypokalemia:  Replaced on 10/4 for K3.3.   Monitor BMP and replace as needed. K 3.4 today, will replace.   Leukocytosis: improving Likely reactive due to steroids versus to infection. --WBC remains elevated: 19.9k from 21.7k Patient remained on daptomycin for staph bacteremia. Antibiotic changed to nafcillin. UA+ LE Added cefepime for gram-negative coverage. Continues on Ancef   Elevated troponin: Likely demand ischemia in the setting of sepsis. Patient denies any chest pain, dizziness, palpitations. He has hx.  of CAD with stent placed in LAD few years ago He has hx. of atrial flutter status post  ablation in the past. Patient follows with [facility] cardiology Aspirin and Plavix is on hold.  Continue statins.    Reactive thrombocytosis Monitor CBC  Hypothyroidism Continue Synthroid.   Hyperlipidemia  Continue Zetia.  Hypokalemia: Replaced.  Continue to monitor   Upper GI bleed: Acute blood loss anemia: Patient had 2 dark black stools. Hb trended down 10.7>9.7>7.7>8.5>7.0.Marland KitchenMarland Kitchen  Now stable Hbg 8.3 >> 8.1 today 10/6 -- Repeat H&H this afternoon given dark BM was seen. GI consulted.   RBC tagged scan negative for acute bleeding. Monitor H&H every 12 hours.   Transfused 1 unit PRBC. Hold Plavix and aspirin.   Patient underwent EGD on 10/3 which showed gastric and duodenal ulcers all nonbleeding with clean ulcer base (Forrest class III)  DVT prophylaxis:SCDs Code Status: DNR Family Communication: Patient's son updated in hallway on the unit 10/5.   Disposition Plan:   Status is: Inpatient Remains inpatient appropriate because:  On IV antibiotics for bacteremia with ongoing evaluation.   Spinal cord stimulator to be removed, followed by MRI for further evaluation of severe uncontrolled back pain. PT recommended SNF.      Consultants:  Neurosurgery Infectious disease  Procedures: None  Antimicrobials: Daptomycin, > nafcillin > Cefepime > Ancef  Subjective: Patient was awake in bed with his legs dangling off the edge.  He is more awake today and agrees he slept a lot yesterday.  He is able to tell me of plan to remove spinal stimulator, thinks it will done on Tuesday.  He asks me to take him to see his son, and asks to go upstairs.  Continues to have back pain but does not look quite as uncomfortable as he had been.    Informed by OT this afternoon pt had a dark BM.  Repeat Hbg level is pending.    Objective: Vitals:   06/01/22 1534 06/01/22 1953 06/02/22 0558 06/02/22 0836  BP: 132/67 115/74 (!) 156/94 (!) 159/99  Pulse: 99 81 (!) 51 96  Resp: '16 18 18 19  ' Temp: 98.6 F (37 C) 98.2 F (36.8 C) 98.4 F (36.9 C) 99.8 F (37.7 C)  TempSrc: Oral     SpO2: 96% 97% 95% 99%   Weight:      Height:        Intake/Output Summary (Last 24 hours) at 06/02/2022 1437 Last data filed at 06/02/2022 1300 Gross per 24 hour  Intake 200 ml  Output 2250 ml  Net -2050 ml   Filed Weights   05/30/22 1028 05/31/22 1334  Weight: 122.5 kg 109.1 kg    Examination:  General exam: awake laying in bed with legs dangling off edge of bed, no acute distress, confused HEENT: dry mucus membranes, hearing grossly normal  Respiratory system: CTAB, no wheezes, rales or rhonchi, normal respiratory effort. Cardiovascular system: normal S1/S2, RRR, no peripheral edema.   Gastrointestinal system: soft, NT, ND Central nervous system: oriented x3 but confused (asks to go upstairs), no gross focal neurologic deficits, normal speech Skin: dry, intact, normal temperature Psychiatry: normal mood, congruent affect, judgement and insight appear normal     Data Reviewed:   Notable labs --- K 3.4, glucose 145, gap 4, WBC 16.4 from 19.1, Hbg 8.1 from 8.3, platelets 560k reactive.  Microbiology ---  Blood cultures from 9/25 grew Staph capitis. Repeat blood cultures from 9/29 - no growth, finalized 10/4.Marland Kitchen Urine culture from 10/1 - no growth.  Echocardiogram and CT myelogram completed -- no evidence of vegetation, discitis, osteomyelitis or abscess.  ID recommended  6 weeks of IV antibiotic if not pursuing TEE.    EGD on 10/3 --- notable for multiple nonbleeding clean-based ulcers, one in the stomach and 2 in the duodenum.    LOS: 11 days    Time spent: 40 mins    Ezekiel Slocumb, DO [facility]   If 7PM-7AM, please contact night-coverage

## 2022-06-02 NOTE — Progress Notes (Signed)
Date of Admission:  05/22/2022      ID: Anthony [name] is a 86 y.o. male Principal Problem:   Sepsis ([place]) Active Problems:   Coag negative Staphylococcus bacteremia   Gastrointestinal hemorrhage with melena   Failed spinal cord stimulator, sequela   Weakness    Subjective: Pt in bed Says he is feeling better after bowel movt    Medications:   acetaminophen  1,000 mg Oral Q8H   amLODipine  5 mg Oral Daily   Chlorhexidine Gluconate Cloth  6 each Topical Q0600   ezetimibe  10 mg Oral Daily   feeding supplement  237 mL Oral TID BM   finasteride  5 mg Oral Daily   furosemide  40 mg Oral Daily   gabapentin  600 mg Oral QHS   levothyroxine  175 mcg Oral Q0600   linaclotide  290 mcg Oral QAC breakfast   pantoprazole (PROTONIX) IV  40 mg Intravenous Q12H   tamsulosin  0.4 mg Oral Daily    Objective: Vital signs in last 24 hours: Temp:  [98.4 F (36.9 C)-99.8 F (37.7 C)] 98.8 F (37.1 C) (10/06 2011) Pulse Rate:  [51-110] 99 (10/06 2011) Resp:  [16-19] 18 (10/06 2011) BP: (144-159)/(81-99) 145/83 (10/06 2011) SpO2:  [95 %-99 %] 97 % (10/06 2011)  PHYSICAL EXAM:  General: awake, oriented , no confusion Face- seb dermatitis- erythematous scaly lesion NL fold and beard area Lungs: Clear to auscultation bilaterally. No Wheezing or Rhonchi. No rales. Heart: Regular rate and rhythm, no murmur, rub or gallop. Abdomen: Soft, non-tender,not distended. Bowel sounds normal. No masses Extremities: atraumatic, no cyanosis. No edema. No clubbing Skin: No rashes or lesions. Or bruising Lymph: Cervical, supraclavicular normal. Neurologic: left lower extremity weakness  Lab Results Recent Labs    06/01/22 0454 06/02/22 0612 06/02/22 1439  WBC 19.9* 16.4*  --   HGB 8.3* 8.1* 8.3*  HCT 25.6* 24.6* 25.4*  NA 136 136  --   K 3.5 3.4*  --   CL 103 100  --   CO2 27 32  --   BUN 17 13  --   CREATININE 1.09 1.02  --    Liver Panel No results for input(s): "PROT", "ALBUMIN",  "AST", "ALT", "ALKPHOS", "BILITOT", "BILIDIR", "IBILI" in the last 72 hours.      Assessment/Plan: Low back pain with radiation down both legs Left leg more weak than right Has underlying degenerative spine disease and after a recent LESI the pain is worse Leucocytosis Staph  capitis bacteremia Concern for early discitis/epidural abscess- repeat CT with contrast no signs of infection Persistent ;leucocytosis which is finally improving  repeat blood culture- NG 2 d echo>no vegetation ? Tee. May not be needed if we decide to treat with 6 weeks of IV antibiotic- continue cefazolin Pt has spine stimulator and that prevents from getting MRI It is going to be removed tomorrow and then he will get a MRI      AKI on CKD- combination of infection, NSAID use, dehydration and BPH causing retention post void bladder scan had 900cc of urine and staright cath was 1600cc Has foley cath Had some LUTS- urine culture no growth     GI bleed- underwent endoscopy- 2 non bleeding duodenal ulcer, and 1 non bleeding gastric ulcer seen On PPI    Hypothyrodism on synthorid   CAD s/p stent B/l TKA   H/o endovascular stent for aneurysm    Discussed with care team ID will follow him peripherally this  weekend- call [facility] if needed

## 2022-06-03 ENCOUNTER — Other Ambulatory Visit: Payer: Self-pay

## 2022-06-03 ENCOUNTER — Inpatient Hospital Stay: Payer: Medicare Other

## 2022-06-03 ENCOUNTER — Inpatient Hospital Stay: Payer: Medicare Other | Admitting: Anesthesiology

## 2022-06-03 ENCOUNTER — Encounter
Admission: EM | Disposition: A | Discharge status: Skilled Nursing Facility | Payer: Self-pay | Source: Home / Self Care | Attending: Family Medicine

## 2022-06-03 DIAGNOSIS — B957 Other staphylococcus as the cause of diseases classified elsewhere: Secondary | ICD-10-CM | POA: Diagnosis not present

## 2022-06-03 DIAGNOSIS — R531 Weakness: Secondary | ICD-10-CM | POA: Diagnosis not present

## 2022-06-03 DIAGNOSIS — R7881 Bacteremia: Secondary | ICD-10-CM | POA: Diagnosis not present

## 2022-06-03 DIAGNOSIS — K921 Melena: Secondary | ICD-10-CM | POA: Diagnosis not present

## 2022-06-03 DIAGNOSIS — T85192A Other mechanical complication of implanted electronic neurostimulator (electrode) of spinal cord, initial encounter: Secondary | ICD-10-CM | POA: Diagnosis not present

## 2022-06-03 HISTORY — PX: SPINAL CORD STIMULATOR REMOVAL: SHX5379

## 2022-06-03 HISTORY — PX: SPINAL CORD STIMULATOR REMOVAL: SHX2423

## 2022-06-03 LAB — CBC
HCT: 25.3 % — ABNORMAL LOW (ref 39.0–52.0)
Hemoglobin: 8.1 g/dL — ABNORMAL LOW (ref 13.0–17.0)
MCH: 30.6 pg (ref 26.0–34.0)
MCHC: 32 g/dL (ref 30.0–36.0)
MCV: 95.5 fL (ref 80.0–100.0)
Platelets: 635 10*3/uL — ABNORMAL HIGH (ref 150–400)
RBC: 2.65 MIL/uL — ABNORMAL LOW (ref 4.22–5.81)
RDW: 15 % (ref 11.5–15.5)
WBC: 13.4 10*3/uL — ABNORMAL HIGH (ref 4.0–10.5)
nRBC: 0 % (ref 0.0–0.2)

## 2022-06-03 LAB — BASIC METABOLIC PANEL
Anion gap: 3 — ABNORMAL LOW (ref 5–15)
BUN: 14 mg/dL (ref 8–23)
CO2: 32 mmol/L (ref 22–32)
Calcium: 9.6 mg/dL (ref 8.9–10.3)
Chloride: 100 mmol/L (ref 98–111)
Creatinine, Ser: 1.21 mg/dL (ref 0.61–1.24)
GFR, Estimated: 59 mL/min — ABNORMAL LOW (ref >60)
Glucose, Bld: 148 mg/dL — ABNORMAL HIGH (ref 70–99)
Potassium: 4 mmol/L (ref 3.5–5.1)
Sodium: 135 mmol/L (ref 135–145)

## 2022-06-03 LAB — GLUCOSE, CAPILLARY: Glucose-Capillary: 157 mg/dL — ABNORMAL HIGH (ref 70–99)

## 2022-06-03 SURGERY — LUMBAR SPINAL CORD STIMULATOR REMOVAL
Anesthesia: General

## 2022-06-03 MED ORDER — PHENYLEPHRINE 80 MCG/ML (10ML) SYRINGE FOR IV PUSH (FOR BLOOD PRESSURE SUPPORT)
PREFILLED_SYRINGE | INTRAVENOUS | Status: AC
  Filled 2022-06-03: qty 30

## 2022-06-03 MED ORDER — FENTANYL CITRATE (PF) 100 MCG/2ML IJ SOLN
25.0000 ug | INTRAMUSCULAR | Status: DC | PRN
  Administered 2022-06-03: 6.25 ug via INTRAVENOUS

## 2022-06-03 MED ORDER — ONDANSETRON HCL 4 MG/2ML IJ SOLN
INTRAMUSCULAR | Status: DC | PRN
  Administered 2022-06-03: 4 mg via INTRAVENOUS

## 2022-06-03 MED ORDER — BUPIVACAINE-EPINEPHRINE (PF) 0.5% -1:200000 IJ SOLN
INTRAMUSCULAR | Status: AC
  Filled 2022-06-03: qty 30

## 2022-06-03 MED ORDER — BUPIVACAINE-EPINEPHRINE (PF) 0.5% -1:200000 IJ SOLN
INTRAMUSCULAR | Status: DC | PRN
  Administered 2022-06-03: 4 mL

## 2022-06-03 MED ORDER — ONDANSETRON HCL 4 MG/2ML IJ SOLN
INTRAMUSCULAR | Status: AC
  Filled 2022-06-03: qty 2

## 2022-06-03 MED ORDER — BUPIVACAINE LIPOSOME 1.3 % IJ SUSP
INTRAMUSCULAR | Status: AC
  Filled 2022-06-03: qty 20

## 2022-06-03 MED ORDER — FENTANYL CITRATE (PF) 100 MCG/2ML IJ SOLN
INTRAMUSCULAR | Status: AC
  Filled 2022-06-03: qty 2

## 2022-06-03 MED ORDER — LACTATED RINGERS IV SOLN: INTRAVENOUS | Status: DC | PRN

## 2022-06-03 MED ORDER — SUGAMMADEX SODIUM 200 MG/2ML IV SOLN
INTRAVENOUS | Status: DC | PRN
  Administered 2022-06-03: 400 mg via INTRAVENOUS

## 2022-06-03 MED ORDER — OXYCODONE HCL 5 MG PO TABS: 5.0000 mg | ORAL_TABLET | Freq: Once | ORAL | Status: DC | PRN

## 2022-06-03 MED ORDER — SUCCINYLCHOLINE CHLORIDE 200 MG/10ML IV SOSY
PREFILLED_SYRINGE | INTRAVENOUS | Status: AC
  Filled 2022-06-03: qty 10

## 2022-06-03 MED ORDER — OXYCODONE HCL 5 MG/5ML PO SOLN: 5.0000 mg | Freq: Once | ORAL | Status: DC | PRN

## 2022-06-03 MED ORDER — LIDOCAINE HCL (PF) 2 % IJ SOLN
INTRAMUSCULAR | Status: AC
  Filled 2022-06-03: qty 5

## 2022-06-03 MED ORDER — PROPOFOL 10 MG/ML IV BOLUS
INTRAVENOUS | Status: AC
  Filled 2022-06-03: qty 20

## 2022-06-03 MED ORDER — BUPIVACAINE HCL (PF) 0.5 % IJ SOLN
INTRAMUSCULAR | Status: AC
  Filled 2022-06-03: qty 30

## 2022-06-03 MED ORDER — ROCURONIUM BROMIDE 100 MG/10ML IV SOLN
INTRAVENOUS | Status: DC | PRN
  Administered 2022-06-03: 30 mg via INTRAVENOUS

## 2022-06-03 MED ORDER — SURGIFLO WITH THROMBIN (HEMOSTATIC MATRIX KIT) OPTIME
TOPICAL | Status: DC | PRN
  Administered 2022-06-03: 1 via TOPICAL

## 2022-06-03 MED ORDER — ROCURONIUM BROMIDE 10 MG/ML (PF) SYRINGE
PREFILLED_SYRINGE | INTRAVENOUS | Status: AC
  Filled 2022-06-03: qty 10

## 2022-06-03 MED ORDER — DEXAMETHASONE SODIUM PHOSPHATE 10 MG/ML IJ SOLN
INTRAMUSCULAR | Status: AC
  Filled 2022-06-03: qty 1

## 2022-06-03 MED ORDER — SODIUM CHLORIDE FLUSH 0.9 % IV SOLN
INTRAVENOUS | Status: AC
  Filled 2022-06-03: qty 20

## 2022-06-03 MED ORDER — EPHEDRINE SULFATE (PRESSORS) 50 MG/ML IJ SOLN
INTRAMUSCULAR | Status: DC | PRN
  Administered 2022-06-03: 10 mg via INTRAVENOUS

## 2022-06-03 MED ORDER — LIDOCAINE HCL (CARDIAC) PF 100 MG/5ML IV SOSY
PREFILLED_SYRINGE | INTRAVENOUS | Status: DC | PRN
  Administered 2022-06-03: 100 mg via INTRAVENOUS

## 2022-06-03 MED ORDER — PROPOFOL 10 MG/ML IV BOLUS
INTRAVENOUS | Status: DC | PRN
  Administered 2022-06-03: 110 mg via INTRAVENOUS

## 2022-06-03 MED ORDER — GADOBUTROL 1 MMOL/ML IV SOLN
10.0000 mL | Freq: Once | INTRAVENOUS | Status: AC | PRN
  Administered 2022-06-03: 10 mL via INTRAVENOUS

## 2022-06-03 MED ORDER — SUCCINYLCHOLINE CHLORIDE 200 MG/10ML IV SOSY
PREFILLED_SYRINGE | INTRAVENOUS | Status: DC | PRN
  Administered 2022-06-03: 120 mg via INTRAVENOUS

## 2022-06-03 MED ORDER — DEXAMETHASONE SODIUM PHOSPHATE 10 MG/ML IJ SOLN
INTRAMUSCULAR | Status: DC | PRN
  Administered 2022-06-03: 10 mg via INTRAVENOUS

## 2022-06-03 MED ORDER — PHENYLEPHRINE HCL (PRESSORS) 10 MG/ML IV SOLN
INTRAVENOUS | Status: DC | PRN
  Administered 2022-06-03 (×5): 160 ug via INTRAVENOUS
  Administered 2022-06-03: 240 ug via INTRAVENOUS
  Administered 2022-06-03: 80 ug via INTRAVENOUS
  Administered 2022-06-03 (×2): 160 ug via INTRAVENOUS
  Administered 2022-06-03: 80 ug via INTRAVENOUS

## 2022-06-03 MED ORDER — METHYLPREDNISOLONE ACETATE 40 MG/ML IJ SUSP
INTRAMUSCULAR | Status: AC
  Filled 2022-06-03: qty 1

## 2022-06-03 SURGICAL SUPPLY — 44 items
BUR NEURO DRILL SOFT 3.0X3.8M (BURR) ×1 IMPLANT
CHLORAPREP W/TINT 26 (MISCELLANEOUS) ×2 IMPLANT
CNTNR SPEC 2.5X3XGRAD LEK (MISCELLANEOUS) ×1
CONT SPEC 4OZ STER OR WHT (MISCELLANEOUS) ×1
CONTAINER SPEC 2.5X3XGRAD LEK (MISCELLANEOUS) ×1 IMPLANT
COUNTER NEEDLE 20/40 LG (NEEDLE) ×1 IMPLANT
DERMABOND ADVANCED .7 DNX12 (GAUZE/BANDAGES/DRESSINGS) IMPLANT
DRAPE C ARM PK CFD 31 SPINE (DRAPES) ×1 IMPLANT
DRAPE LAPAROTOMY 100X77 ABD (DRAPES) ×1 IMPLANT
DRAPE SURG 17X11 SM STRL (DRAPES) ×1 IMPLANT
DRSG OPSITE POSTOP 3X4 (GAUZE/BANDAGES/DRESSINGS) IMPLANT
DRSG OPSITE POSTOP 4X6 (GAUZE/BANDAGES/DRESSINGS) IMPLANT
ELECT CAUTERY BLADE TIP 2.5 (TIP) ×1
ELECT EZSTD 165MM 6.5IN (MISCELLANEOUS) ×1
ELECT REM PT RETURN 9FT ADLT (ELECTROSURGICAL) ×1
ELECTRODE CAUTERY BLDE TIP 2.5 (TIP) ×1 IMPLANT
ELECTRODE EZSTD 165MM 6.5IN (MISCELLANEOUS) ×1 IMPLANT
ELECTRODE REM PT RTRN 9FT ADLT (ELECTROSURGICAL) ×1 IMPLANT
GAUZE 4X4 16PLY ~~LOC~~+RFID DBL (SPONGE) ×1 IMPLANT
GLOVE BIOGEL PI IND STRL 8.5 (GLOVE) ×1 IMPLANT
GLOVE SURG SYN 8.5  E (GLOVE) ×3
GLOVE SURG SYN 8.5 E (GLOVE) ×3 IMPLANT
GLOVE SURG SYN 8.5 PF PI (GLOVE) ×3 IMPLANT
GOWN SRG LRG LVL 4 IMPRV REINF (GOWNS) ×1 IMPLANT
GOWN SRG XL LVL 3 NONREINFORCE (GOWNS) ×1 IMPLANT
GOWN STRL NON-REIN TWL XL LVL3 (GOWNS) ×1
GOWN STRL REIN LRG LVL4 (GOWNS) ×1
GRADUATE 1200CC STRL 31836 (MISCELLANEOUS) ×1 IMPLANT
KIT SPINAL PRONEVIEW (KITS) ×1 IMPLANT
MANIFOLD NEPTUNE II (INSTRUMENTS) ×1 IMPLANT
MARKER SKIN DUAL TIP RULER LAB (MISCELLANEOUS) ×1 IMPLANT
NDL SAFETY ECLIP 18X1.5 (MISCELLANEOUS) ×2 IMPLANT
NS IRRIG 1000ML POUR BTL (IV SOLUTION) ×1 IMPLANT
PACK LAMINECTOMY NEURO (CUSTOM PROCEDURE TRAY) ×1 IMPLANT
PAD ARMBOARD 7.5X6 YLW CONV (MISCELLANEOUS) ×1 IMPLANT
SURGIFLO W/THROMBIN 8M KIT (HEMOSTASIS) IMPLANT
SUT DVC VLOC 3-0 CL 6 P-12 (SUTURE) ×1 IMPLANT
SUT VIC AB 0 CT1 27 (SUTURE) ×1
SUT VIC AB 0 CT1 27XCR 8 STRN (SUTURE) ×1 IMPLANT
SUT VIC AB 2-0 CT1 18 (SUTURE) ×1 IMPLANT
SYR 30ML LL (SYRINGE) ×1 IMPLANT
TOWEL OR 17X26 4PK STRL BLUE (TOWEL DISPOSABLE) ×3 IMPLANT
TRAP FLUID SMOKE EVACUATOR (MISCELLANEOUS) ×1 IMPLANT
TUBING CONNECTING 10 (TUBING) ×1 IMPLANT

## 2022-06-03 NOTE — Progress Notes (Signed)
Cross Cover Paged by [facility] radiology with MRI lumbar spine findings IMPRESSION: 1. Findings concerning for osteomyelitis discitis at L5-S1, with additional suspected changes of bilateral septic arthritis about the bilateral L4-5 facets. 2. Associated extensive epidural phlegmon/abscess extending from L1-2 through the sacrum with resultant moderate to severe diffuse spinal stenosis. 3. Diffuse soft tissue edema and enhancement throughout the lower paraspinous soft tissues, consistent with regional cellulitis/myositis. Probable 2.4 cm paraspinous abscess along the left anterolateral aspect of the lower vertebral column, emanating from the L5-S1 interspace as above. 4. Underlying advanced multilevel degenerative spondylosis as detailed above.  Dr Izora Ribas with Neurosurgery notified of results Patient is on Ancef

## 2022-06-03 NOTE — Anesthesia Procedure Notes (Signed)
Procedure Name: Intubation Date/Time: 06/03/2022 7:44 AM  Performed by: Loree Fee, CRNAPre-anesthesia Checklist: Patient identified, Emergency Drugs available, Suction available, Patient being monitored and Timeout performed Patient Re-evaluated:Patient Re-evaluated prior to induction Oxygen Delivery Method: Circle system utilized Preoxygenation: Pre-oxygenation with 100% oxygen Induction Type: IV induction Ventilation: Mask ventilation without difficulty Laryngoscope Size: Mac and 4 Grade View: Grade I Tube type: Oral Tube size: 7.5 mm Number of attempts: 1 Airway Equipment and Method: Stylet Placement Confirmation: ETT inserted through vocal cords under direct vision, positive ETCO2, CO2 detector and breath sounds checked- equal and bilateral Secured at: 22 cm Tube secured with: Tape Dental Injury: Teeth and Oropharynx as per pre-operative assessment  Difficulty Due To: Difficulty was unanticipated

## 2022-06-03 NOTE — Op Note (Signed)
Indications: the patient is a 86 yo male who was diagnosed with nonfunctional spinal cord stimulator. Due to need for additional imaging, removal was recommended.   Findings: successful removal of a spinal cord stimulator.    Preoperative Diagnosis: nonfunctional spinal cord stimulator Postoperative Diagnosis: same     EBL: 10 ml IVF:  ml Drains: none Disposition: Extubated and Stable to PACU Complications: none       Preoperative Note:    Risks of surgery discussed in clinic.   Operative Note:      The patient was then brought from the preoperative center with intravenous access established.  The patient underwent general anesthesia and endotracheal tube intubation, then was rotated on the [facility] table where all pressure points were appropriately padded.  The prior incisions were identified and marked.  The incision on the left buttock for his internal pulse generator was opened until the pulse generator was identified and fully dissected free.  The leads were divided and the pulse generator handed off.  The spinal incision was then opened.  The leads were identified and followed to the laminectomy defect.  After disconnecting the stay sutures, the paddle was still unable to be removed.  At this point, I used the high-speed drill to perform an additional level laminectomy in order to free up the paddle.  The lamina was thinned out and then removed with a 2 mm punch.  The bottom part of the paddle was identified, freed, then removed.  All contacts were in position.  An x-ray was taken to confirm for removal with no remainder left.  The incisions were then irrigated.  Hemostasis was achieved.  Each wound was then closed in layers with 2-0 and 0 Vicryl.  3-0 Monocryl was placed on the dermis.  Dermabond was used on each incision.    Patient was then rotated back to the preoperative bed awakened from anesthesia and taken to recovery. All counts are correct in this case.   I performed the  entire procedure.    Meade Maw MD

## 2022-06-03 NOTE — Anesthesia Preprocedure Evaluation (Signed)
Anesthesia Evaluation  Patient identified by MRN, date of birth, ID band Patient awake    Reviewed: Allergy & Precautions, NPO status , Patient's Chart, lab work & pertinent test results  History of Anesthesia Complications (+) PONV and history of anesthetic complications  Airway Mallampati: III  TM Distance: <3 FB Neck ROM: full    Dental  (+) Missing   Pulmonary shortness of breath and with exertion, sleep apnea    Pulmonary exam normal        Cardiovascular hypertension, + CAD and +CHF  Normal cardiovascular exam     Neuro/Psych  PSYCHIATRIC DISORDERS       Neuromuscular disease    GI/Hepatic Neg liver ROS,GERD  ,,  Endo/Other  Hypothyroidism    Renal/GU Renal disease     Musculoskeletal   Abdominal   Peds  Hematology negative hematology ROS (+)   Anesthesia Other Findings Past Medical History: 1980s: Anosmia 2018: Atrial flutter ([place])     Comment:  had an ablation with dr. Erick Alley 01/11/2022: Basal cell carcinoma     Comment:  L cheek - excised 04/25/22 No date: CAD (coronary artery disease) No date: Cancer ([place])     Comment:  skin cancer No date: Chronic constipation No date: Chronic insomnia No date: Chronic lower back pain     Comment:  s/p spine stimulator placement No date: Complication of anesthesia     Comment:  when under general, he woke up agitated and unable to be              held down No date: Diastolic CHF ([place]) 0539: History of diverticulitis 2013: History of pneumonia No date: Hyperlipidemia No date: Hypertension No date: Hypothyroidism No date: Obesity, Class II, BMI 35-39.9, with comorbidity No date: OSA on CPAP No date: PONV (postoperative nausea and vomiting)  Past Surgical History: 05/07/2017: A-FLUTTER ABLATION; N/A     Comment:  Procedure: A-Flutter Ablation;  Surgeon: Deboraha Sprang, MD;  Location: [facility];  Service:               Cardiovascular;   Laterality: N/A; 11/2017: CARDIOVASCULAR STRESS TEST     Comment:  no ischemia, low risk study 2007: COLONOSCOPY     Comment:  normal per prior PCP records, rpt 10 yrs (Dr Osie Cheeks) No date: ELBOW SURGERY; Right     Comment:  ulnar nerve decompression.  PT DOES NOT RECALL THIS               PROCEDURE 05/05/2020: ENDOVASCULAR REPAIR/STENT GRAFT; N/A     Comment:  Procedure: ENDOVASCULAR REPAIR/STENT GRAFT;  Surgeon:               Algernon Huxley, MD;  Location: [facility];                Service: Cardiovascular;  Laterality: N/A; 05/30/2022: ESOPHAGOGASTRODUODENOSCOPY (EGD) WITH PROPOFOL; N/A     Comment:  Procedure: ESOPHAGOGASTRODUODENOSCOPY (EGD) WITH               PROPOFOL;  Surgeon: Jonathon Bellows, MD;  Location: [facility]               ENDOSCOPY;  Service: Gastroenterology;  Laterality: N/A; 08/2018: LAMINECTOMY THORACIC SPINE W/ PLACEMENT SPINAL CORD  STIMULATOR     Comment:  and removal of prior spine stimulator (Dr Lacinda Axon) No date: NASAL SINUS SURGERY     Comment:  nasal polyps. done a long time ago 2013: [place] (PCI-S)     Comment:  EF55%, 70% mid LAD, 99% mid RCA, mild MR, elev LVEDP,               DES to mid LAD. RCA is nondominant 06/2017: RADIOFREQUENCY ABLATION     Comment:  lumbar region ([place])  2000s: REPLACEMENT TOTAL KNEE BILATERAL; Bilateral 06/2016: SPINAL CORD STIMULATOR IMPLANT 09/09/2018: SPINAL CORD STIMULATOR INSERTION; N/A     Comment:  Procedure: LUMBAR SPINAL CORD STIMULATOR LEAD AND               BATTERY REMOVAL AND LAMINECTOMY FOR PLACEMENT OF PADDLE;               Surgeon: Deetta Perla, MD;  Location: [facility];  Service:              Neurosurgery;  Laterality: N/A; 09/16/2018: SPINAL CORD STIMULATOR INSERTION; N/A     Comment:  Procedure: PLACEMENT OF SPINAL CORD STIMULATOR BATTERY;               Surgeon: Deetta Perla, MD;  Location: [facility];  Service:              Neurosurgery;  Laterality: N/A;  BMI    Body Mass  Index: 32.62 kg/m      Reproductive/Obstetrics negative OB ROS                             Anesthesia Physical Anesthesia Plan  ASA: 3  Anesthesia Plan: General ETT   Post-op Pain Management:    Induction: Intravenous  PONV Risk Score and Plan: Ondansetron, Dexamethasone, Midazolam and Treatment may vary due to age or medical condition  Airway Management Planned: Oral ETT  Additional Equipment:   Intra-op Plan:   Post-operative Plan: Extubation in OR  Informed Consent: I have reviewed the patients History and Physical, chart, labs and discussed the procedure including the risks, benefits and alternatives for the proposed anesthesia with the patient or authorized representative who has indicated his/her understanding and acceptance.   Patient has DNR.  Discussed DNR with patient and Suspend DNR.   Dental Advisory Given  Plan Discussed with: Anesthesiologist, CRNA and Surgeon  Anesthesia Plan Comments: (Patient consented for risks of anesthesia including but not limited to:  - adverse reactions to medications - damage to eyes, teeth, lips or other oral mucosa - nerve damage due to positioning  - sore throat or hoarseness - Damage to heart, brain, nerves, lungs, other parts of body or loss of life  Patient voiced understanding.)       Anesthesia Quick Evaluation

## 2022-06-03 NOTE — Interval H&P Note (Signed)
History and Physical Interval Note:  06/03/2022 7:11 AM  Anthony [name]  has presented today for surgery, with the diagnosis of malfunctioning spinal stimulator.  The various methods of treatment have been discussed with the patient and family. After consideration of risks, benefits and other options for treatment, the patient has consented to  Procedure(s): LUMBAR SPINAL CORD STIMULATOR REMOVAL (N/A) as a surgical intervention.  The patient's history has been reviewed, patient examined, no change in status, stable for surgery.  I have reviewed the patient's chart and labs.  Questions were answered to the patient's satisfaction.    Heart sounds normal no MRG. Chest Clear to Auscultation Bilaterally.    Yann Biehn

## 2022-06-03 NOTE — Transfer of Care (Signed)
Immediate Anesthesia Transfer of Care Note  [name]  Procedure(s) Performed: LUMBAR SPINAL CORD STIMULATOR REMOVAL  Patient Location: PACU  Anesthesia Type:General  Level of Consciousness: drowsy  Airway & Oxygen Therapy: Patient Spontanous Breathing  Post-op Assessment: Report given to RN  Post vital signs: stable  Last Vitals:  Vitals Value Taken Time  BP 121/62   Temp 97.14F   Pulse 101 06/03/22 0907  Resp 18 06/03/22 0907  SpO2 98 % 06/03/22 0907  Vitals shown include unvalidated device data.  Last Pain:  Vitals:   06/03/22 0546  TempSrc:   PainSc: Asleep      Patients Stated Pain Goal: 0 (79/98/72 1587)  Complications:  Encounter Notable Events  Notable Event Outcome Phase Comment  Difficult to intubate - unexpected  Intraprocedure Filed from anesthesia note documentation.

## 2022-06-03 NOTE — Anesthesia Postprocedure Evaluation (Signed)
Anesthesia Post Note  Patient: CORKY BLUMSTEIN  Procedure(s) Performed: LUMBAR SPINAL CORD STIMULATOR REMOVAL  Patient location during evaluation: PACU Anesthesia Type: General Level of consciousness: awake and alert Pain management: pain level controlled Vital Signs Assessment: post-procedure vital signs reviewed and stable Respiratory status: spontaneous breathing, nonlabored ventilation, respiratory function stable and patient connected to nasal cannula oxygen Cardiovascular status: blood pressure returned to baseline and stable Postop Assessment: no apparent nausea or vomiting Anesthetic complications: yes   Encounter Notable Events  Notable Event Outcome Phase Comment  Difficult to intubate - unexpected  Intraprocedure Filed from anesthesia note documentation.     Last Vitals:  Vitals:   06/03/22 1012 06/03/22 1305  BP: 120/63 122/78  Pulse: 91 92  Resp: 12   Temp: (!) 36.4 C   SpO2: (!) 89% 94%    Last Pain:  Vitals:   06/03/22 0938  TempSrc:   PainSc: Asleep                 Precious Haws Jammy Plotkin

## 2022-06-03 NOTE — Progress Notes (Signed)
PROGRESS NOTE    Anthony [name]  RFX:[id number] DOB: 1935/12/20 DOA: 05/22/2022  PCP: Ria Bush, MD   Brief Narrative: This 86 yrs old male with medical history significant of atrial flutter,  status post ablation, chronic back pain status post spine stimulator which is malfunctioning, hypertension, hyperlipidemia, hypothyroidism, obesity, obstructive sleep apnea on CPAP comes to the emergency room with complaints of lower back pain radiating bilaterally to both legs,  going down along with increased urinary frequency.  Patient is found to have Staph aureus bacteremia, back pain is concerning for epidural abscess/early discitis.  Infectious diseases consulted, recommended IV antibiotics.  Neurosurgery was consulted,  states No neurosurgical intervention needed, recommended IV steroids and IV antibiotics.  Echo: no evidence of vegetation.  LVEF 26%, grade 1 diastolic dysfunction. CT lumbar spine with no evidence of abscess or discitis or osteomyelitis.  Patient continued to have leukocytosis despite being on Nafcillin, UA with positive LE, started on cefepime.  ID recommended 6 weeks of IV antibiotics if we are not pursuing TEE.    Hospital course complicated by acute GI bleeding with anemia requiring blood transfusion.  EGD was performed and patient found to have peptic ulcer disease.  Ongoing evaluation for severe back pain including removal of nonfunctioning spinal stimulator in order to undergo lumbar MRI.  Neurosurgery following.  Assessment & Plan:   Principal Problem:   Sepsis ([place]) Active Problems:   Coag negative Staphylococcus bacteremia   Gastrointestinal hemorrhage with melena   Failed spinal cord stimulator ([facility])   Weakness  Severe sepsis POA:  Staph Capitis bacteremia: He was found to have tachycardia , elevated white count of 21K, lactic acid of 2.0, increased creatinine to 2.6 from baseline 1.3. Lactic acid normalized, 1.2 > 2.0.  Infectious disease Dr. Delaine Lame  consulted.  Antibiotics changed to daptomycin. Repeat blood cultures: No growth  Echo with no evidence of vegetation CT lumbar spine: no evidence of discitis, abscess or osteomyelitis. WBC count remains elevated.   UA positive LE no UTI sx's, chest x-ray unremarkable. Cefepime added for persistent leukocytosis. Discussed with ID, resumed of nafcillin  ID recommended 6 weeks of IV antibiotics if we are not pursuing TEE. Currently on Ancef.  Chronic back pain with severe degenerative arthritis: Chronic pain /history of spinal cord pain stimulator-- malfunctioning. Patient follows at pain clinic with Dr. Holley Raring He recently underwent lumbar spinal steroid injection on 05/17/2022.0 CRP 42.2, ESR 10.0 Neurosurgery consulting - Dr. Cari Caraway Given increase in weakness more on the left and right-- needs further evaluation with MRI.   10/7: Spinal stimulator removed this morning Pending lumbar MRI w/wo contrast. Started scheduled Tylenol 1000 mg TID Continue PRN PO and IV pain meds  Trial of low dose Robaxin was too sedating for pt - d/c'd Continue gabapentin. CT lumbar spine no evidence of discitis, abscess or osteomyelitis.   AKI on CKD stage IIIb:  Baseline creatinine 1.3 Presented  with creatinine of 2.62--2.18 >2.25>1.42 > ...1.15>1.09>1.02>1.21 Now off IV fluids Avoid nephrotoxic agents AKI resolved.    Hypokalemia:  Replaced on 10/4 for K3.3.   Monitor BMP and replace as needed. K 3.4 today, will replace.   Leukocytosis: improving Likely reactive due to steroids versus to infection. --WBC remains elevated: 19.9k from 21.7k Patient remained on daptomycin for staph bacteremia. Antibiotic changed to nafcillin. UA+ LE Added cefepime for gram-negative coverage. Continues on Ancef   Elevated troponin: Likely demand ischemia in the setting of sepsis. Patient denies any chest pain, dizziness, palpitations. He has hx.  of CAD with stent placed in LAD few years ago He has hx. of  atrial flutter status post ablation in the past. Patient follows with [facility] cardiology Aspirin and Plavix is on hold.  Continue statins.   Reactive thrombocytosis Monitor CBC  Hypothyroidism Continue Synthroid.   Hyperlipidemia  Continue Zetia.  Hypokalemia: Replaced.  Continue to monitor   Upper GI bleed: Acute blood loss anemia: Patient had 2 dark black stools. Hb trended down 10.7>9.7>7.7>8.5>7.0.Marland KitchenMarland Kitchen  Now stable Hbg 8.3 >> 8.1 >>8.3>>8.1 10/6 --nursing reported recurrent dark stools, hemoglobin and stable GI consulted.   RBC tagged scan negative for acute bleeding. Transfused 1 unit PRBC. Hold Plavix and aspirin.   Patient underwent EGD on 10/3 which showed gastric and duodenal ulcers all nonbleeding with clean ulcer base (Forrest class III)  DVT prophylaxis:SCDs Code Status: DNR Family Communication: Patient's son updated in hallway on the unit 10/5.  Will attempt to call  Disposition Plan:   Status is: Inpatient Remains inpatient appropriate because:  On IV antibiotics for bacteremia with ongoing evaluation.   Pending lumbar MRI for further evaluation of severe uncontrolled back pain. PT recommended SNF.      Consultants:  Neurosurgery Infectious disease  Procedures: None  Antimicrobials: Daptomycin, > nafcillin > Cefepime > Ancef  Subjective: Patient seen after going to the OR this morning for removal of his spinal stimulator.  He remains somewhat sedated but will respond briefly, denying pain at this time.   Objective: Vitals:   06/03/22 0930 06/03/22 0938 06/03/22 1012 06/03/22 1305  BP: 122/67 (!) 108/55 120/63 122/78  Pulse: 90 97 91 92  Resp: '15 19 12   ' Temp:  (!) 97.4 F (36.3 C) (!) 97.5 F (36.4 C)   TempSrc:      SpO2: 96% 95% (!) 89% 94%  Weight:      Height:        Intake/Output Summary (Last 24 hours) at 06/03/2022 1332 Last data filed at 06/03/2022 0904 Gross per 24 hour  Intake 450 ml  Output 3760 ml  Net -3310 ml   Filed  Weights   05/30/22 1028 05/31/22 1334  Weight: 122.5 kg 109.1 kg    Examination:  General exam: Sleeping/sedated, no acute distress, confused HEENT: Eyes remain closed, hearing grossly normal  Respiratory system: CTAB, no wheezes, rales or rhonchi, normal respiratory effort. Cardiovascular system: normal S1/S2, RRR, no peripheral edema.   Gastrointestinal system: soft, NT, ND Central nervous system: Grossly nonfocal exam, limited by somnolence Skin: dry, intact, normal temperature Psychiatry: normal mood, congruent affect, judgement and insight appear normal     Data Reviewed:   Notable labs --- glucose 148 otherwise unremarkable BMP, leukocytosis improving 13.4, hemoglobin stable 8.1, reactive thrombocytosis 635 (rising)  Microbiology ---  Blood cultures from 9/25 grew Staph capitis. Repeat blood cultures from 9/29 - no growth, finalized 10/4.Marland Kitchen Urine culture from 10/1 - no growth.  Procedures: Echocardiogram and CT myelogram completed -- no evidence of vegetation, discitis, osteomyelitis or abscess.    EGD on 10/3 --- notable for multiple nonbleeding clean-based ulcers, one in the stomach and 2 in the duodenum.  10/7 - spinal stimulator removed     LOS: 12 days    Time spent: 40 mins    Ezekiel Slocumb, DO [facility]   If 7PM-7AM, please contact night-coverage

## 2022-06-04 DIAGNOSIS — K921 Melena: Secondary | ICD-10-CM | POA: Diagnosis not present

## 2022-06-04 DIAGNOSIS — R7881 Bacteremia: Secondary | ICD-10-CM | POA: Diagnosis not present

## 2022-06-04 DIAGNOSIS — T85192A Other mechanical complication of implanted electronic neurostimulator (electrode) of spinal cord, initial encounter: Secondary | ICD-10-CM

## 2022-06-04 DIAGNOSIS — R531 Weakness: Secondary | ICD-10-CM | POA: Diagnosis not present

## 2022-06-04 DIAGNOSIS — B957 Other staphylococcus as the cause of diseases classified elsewhere: Secondary | ICD-10-CM | POA: Diagnosis not present

## 2022-06-04 LAB — HEMOGLOBIN AND HEMATOCRIT, BLOOD
HCT: 25.5 % — ABNORMAL LOW (ref 39.0–52.0)
Hemoglobin: 8.1 g/dL — ABNORMAL LOW (ref 13.0–17.0)

## 2022-06-04 LAB — BASIC METABOLIC PANEL
Anion gap: 6 (ref 5–15)
BUN: 26 mg/dL — ABNORMAL HIGH (ref 8–23)
CO2: 30 mmol/L (ref 22–32)
Calcium: 10 mg/dL (ref 8.9–10.3)
Chloride: 95 mmol/L — ABNORMAL LOW (ref 98–111)
Creatinine, Ser: 1.32 mg/dL — ABNORMAL HIGH (ref 0.61–1.24)
GFR, Estimated: 53 mL/min — ABNORMAL LOW (ref >60)
Glucose, Bld: 146 mg/dL — ABNORMAL HIGH (ref 70–99)
Potassium: 4.5 mmol/L (ref 3.5–5.1)
Sodium: 131 mmol/L — ABNORMAL LOW (ref 135–145)

## 2022-06-04 LAB — CBC
HCT: 23.6 % — ABNORMAL LOW (ref 39.0–52.0)
Hemoglobin: 7.4 g/dL — ABNORMAL LOW (ref 13.0–17.0)
MCH: 30.3 pg (ref 26.0–34.0)
MCHC: 31.4 g/dL (ref 30.0–36.0)
MCV: 96.7 fL (ref 80.0–100.0)
Platelets: 580 10*3/uL — ABNORMAL HIGH (ref 150–400)
RBC: 2.44 MIL/uL — ABNORMAL LOW (ref 4.22–5.81)
RDW: 15 % (ref 11.5–15.5)
WBC: 14.5 10*3/uL — ABNORMAL HIGH (ref 4.0–10.5)
nRBC: 0 % (ref 0.0–0.2)

## 2022-06-04 MED ORDER — FLUOCINONIDE 0.05 % EX OINT
1.0000 | TOPICAL_OINTMENT | Freq: Every day | CUTANEOUS | Status: DC | PRN
  Filled 2022-06-04: qty 15

## 2022-06-04 MED ORDER — KETOCONAZOLE 2 % EX SHAM
MEDICATED_SHAMPOO | CUTANEOUS | Status: DC
  Filled 2022-06-04: qty 120

## 2022-06-04 MED ORDER — KETOCONAZOLE 2 % EX CREA
TOPICAL_CREAM | Freq: Every day | CUTANEOUS | Status: DC
  Filled 2022-06-04: qty 15

## 2022-06-04 NOTE — Progress Notes (Addendum)
PROGRESS NOTE    Anthony [name]  [id number]:[id number] DOB: [birth year] DOA: 05/22/2022  PCP: Ria Bush, MD   Brief Narrative: This 86 yrs old male with medical history significant of atrial flutter,  status post ablation, chronic back pain status post spine stimulator which is malfunctioning, hypertension, hyperlipidemia, hypothyroidism, obesity, obstructive sleep apnea on CPAP comes to the emergency room with complaints of lower back pain radiating bilaterally to both legs,  going down along with increased urinary frequency.  Patient is found to have Staph aureus bacteremia, back pain is concerning for epidural abscess/early discitis.  Infectious diseases consulted, recommended IV antibiotics.  Neurosurgery was consulted,  states No neurosurgical intervention needed, recommended IV steroids and IV antibiotics.  Echo: no evidence of vegetation.  LVEF 44%, grade 1 diastolic dysfunction. CT lumbar spine with no evidence of abscess or discitis or osteomyelitis.  Patient continued to have leukocytosis despite being on Nafcillin, UA with positive LE, started on cefepime.  ID recommended 6 weeks of IV antibiotics if we are not pursuing TEE.    Hospital course complicated by acute GI bleeding with anemia requiring blood transfusion.  EGD was performed and patient found to have peptic ulcer disease.  Ongoing evaluation for severe back pain including removal of nonfunctioning spinal stimulator in order to undergo lumbar MRI.  Neurosurgery following.  Assessment & Plan:   Principal Problem:   Sepsis ([place]) Active Problems:   Coag negative Staphylococcus bacteremia   Gastrointestinal hemorrhage with melena   Failed spinal cord stimulator ([facility])   Weakness  Severe sepsis POA:  Staph Capitis bacteremia: He was found to have tachycardia , elevated white count of 21K, lactic acid of 2.0, increased creatinine to 2.6 from baseline 1.3. Lactic acid normalized. Infectious disease Dr. Delaine Lame consulted.    Repeat blood cultures: No growth  Echo with no evidence of vegetation CT lumbar spine: no evidence of discitis, abscess or osteomyelitis. WBC count remains elevated, improving. UA positive LE no UTI sx's, chest x-ray unremarkable. Cefepime added for persistent leukocytosis. Discussed with ID, resumed of nafcillin  ID recommended 6 weeks of IV antibiotics if we are not pursuing TEE. Currently on Ancef.  Chronic back pain with severe degenerative arthritis: Chronic pain /history of spinal cord pain stimulator-- malfunctioning. Patient follows at pain clinic with Dr. Holley Raring He recently underwent lumbar spinal steroid injection on 05/17/2022.0 CRP 42.2, ESR 10.0 Neurosurgery consulting - Dr. Cari Caraway Pt has increased weakness in LE's left more than right 10/7: Spinal stimulator removed  10/7: Lumbar MRI w/wo contrast shows extensive infection Neurosurgery plans for OR to washout tomorrow Started scheduled Tylenol 1000 mg TID Continue PRN PO and IV pain meds  Trial of low dose Robaxin was too sedating for pt - d/c'd Continue gabapentin. CT lumbar spine no evidence of discitis, abscess or osteomyelitis.   AKI on CKD stage IIIb:  Baseline creatinine 1.3 Presented  with creatinine of 2.62--2.18 >2.25>1.42 > ...1.15>1.09>1.02>1.21>1.32 Now off IV fluids Avoid nephrotoxic agents AKI resolved, now recurrent but mild, after being NPO for surgery yesterday and sedated, little PO intake yesterday --encourage PO hydration.    Hypokalemia:  Replaced on 10/4 for K3.3.   Monitor BMP and replace as needed. K 3.4 today, will replace.   Leukocytosis: improving Likely reactive due to steroids versus to infection. --WBC remains elevated, now slowly improving Patient remained on daptomycin for staph bacteremia. Antibiotic changed to nafcillin. UA+ LE Added cefepime for gram-negative coverage. Now on Ancef   Elevated troponin: Likely demand ischemia in the setting  of sepsis. Patient denies  any chest pain, dizziness, palpitations. Hx of CAD with stent placed in LAD few years ago Hx. of atrial flutter status post ablation in the past.  Patient follows with [facility] cardiology --Aspirin and Plavix is on hold.   --Continue statin   Reactive thrombocytosis Monitor CBC  Hypothyroidism Continue Synthroid.   Hyperlipidemia  Continue Zetia.  Hypokalemia: Replaced.  Continue to monitor   Upper GI bleed: Acute blood loss anemia: Patient had 2 dark black stools. Hb trended down 10.7>9.7>7.7>8.5>7.0.Marland KitchenMarland Kitchen  Now stable Hbg 8.3 >> 8.1 >>8.3>>8.1>>7.4 10/6 --nursing reported recurrent dark stools, hemoglobin and stable 10/8 - Hbg 7.4, down from 8.1 after stimulator removal in OR yesterday. --Repeat H&H this afternoon, CBC in AM GI consulted.   RBC tagged scan negative for acute bleeding. Transfused 1 unit PRBC. Hold Plavix and aspirin.   Patient underwent EGD on 10/3 which showed gastric and duodenal ulcers all nonbleeding with clean ulcer base (Forrest class III)  DVT prophylaxis:SCDs Code Status: DNR Family Communication: Patient's son updated in hallway on the unit 10/5.  Will attempt to call  Disposition Plan:   Status is: Inpatient Remains inpatient appropriate because:  On IV antibiotics for bacteremia with ongoing evaluation.   Pending lumbar MRI for further evaluation of severe uncontrolled back pain. PT recommended SNF.      Consultants:  Neurosurgery Infectious disease  Procedures: None  Antimicrobials: Daptomycin, > nafcillin > Cefepime > Ancef  Subjective: Patient seen awake semireclined in bed on rounds today.  He is awake alert, talkative and does not seem confused this morning.  He recalls having his stimulator removed yesterday and rolls to his side for me to look at the incision area.  He knows there is plan to go to the OR tomorrow to clean up infection in his back.  Currently endorses moderate pain in but no other acute  complaints.   Objective: Vitals:   06/03/22 1632 06/03/22 2152 06/04/22 0538 06/04/22 0922  BP: 104/62 107/64 130/70 (!) 111/93  Pulse: 95 83 83 81  Resp:  '20 18 17  ' Temp: 98 F (36.7 C) 98.6 F (37 C) 97.7 F (36.5 C) 98.1 F (36.7 C)  TempSrc:   Oral   SpO2: 92% 92% 93% 90%  Weight:      Height:        Intake/Output Summary (Last 24 hours) at 06/04/2022 1641 Last data filed at 06/04/2022 0919 Gross per 24 hour  Intake 720 ml  Output 1750 ml  Net -1030 ml   Filed Weights   05/30/22 1028 05/31/22 1334  Weight: 122.5 kg 109.1 kg    Examination:  General exam: Awake and alert, no acute distress, does not demonstrate confusion during my encounter HEENT: Moist mucous membranes, patches of erythema and skin flaking on face and forehead slightly worse than prior days (was likely supine during surgery yesterday), hearing grossly normal  Respiratory system: CTAB, no wheezes, rales or rhonchi, normal respiratory effort. Cardiovascular system: normal S1/S2, RRR, no peripheral edema.   Gastrointestinal system: soft, NT, ND Central nervous system: Alert and oriented x4 grossly nonfocal exam, limited by somnolence Skin: dry, intact, normal temperature Psychiatry: normal mood, congruent affect, judgement and insight appear normal     Data Reviewed:   Notable labs --- sodium mildly low 131 from 135, chloride 95, glucose 146, BUN 26 up from 14, creatinine 1.32 up from 1.21 (he was n.p.o. much of yesterday for spinal cord removal in the OR).  White count 14.5 from  13.4.  Hemoglobin 7.4 down slightly from 8.1 yesterday.  Platelets 580  Microbiology ---  Blood cultures from 9/25 grew Staph capitis. Repeat blood cultures from 9/29 - no growth, finalized 10/4.Marland Kitchen Urine culture from 10/1 - no growth.  MRI lumbar spine with extensive infection --discitis/osteomyelitis at L5-S1, suspected septic arthritis at bilateral facets at L4-5, extensive epidural phlegmon/abscess extending from L1-2  down through the sacrum with moderate to severe diffuse spinal stenosis, soft tissue edema and enhancement of the lower paraspinal soft tissues (cellulitis/myositis), 2.4 cm paraspinal abscess along the left anterolateral vertebral column from L5-S1.  Procedures: Echocardiogram and CT myelogram completed -- no evidence of vegetation, discitis, osteomyelitis or abscess.    EGD on 10/3 --- notable for multiple nonbleeding clean-based ulcers, one in the stomach and 2 in the duodenum.  10/7 - spinal stimulator removed     LOS: 13 days    Time spent: 35 minutes    Ezekiel Slocumb, DO [facility]   If 7PM-7AM, please contact night-coverage

## 2022-06-04 NOTE — Progress Notes (Signed)
Physical Therapy Treatment Patient Details Name: Anthony [name] MRN: [id number] DOB: 1935-12-17 Today's Date: 06/04/2022   History of Present Illness This 86 yrs old male with medical history significant of atrial flutter, status post ablation, chronic back pain status post stent spine stimulator which is malfunctioning, hypertension, hyperlipidemia, hypothyroidism, obesity, obstructive sleep apnea on CPAP comes to the emergency room with complaints of lower back pain radiating bilaterally to both legs,  going down along with increased urinary frequency. Patient is found to have Staph aureus bacteremia, back pain is concerning for epidural abscess/early discitis.  Infectious diseases consulted recommended IV antibiotics. Neurosurgery is consulted states no neurosurgical intervention needed, recommended IV steroids and IV antibiotics. If additional work-up is needed could consider MRI versus CT myelogram.  S/P spinal cord stimulator removal (06/03/22) with continue upon transfer orders received.  Additional imaging completed, and patient now pending surgical evacuation of extensive epidural abscess next date per notes.    PT Comments    Patient seen per RN request, s/p removal of spinal cord stimulator (06/03/22) with goal of OOB to chair.  Patient continues to endorse significant pain to back, but does demonstrate some improvement in mobility performance and overall tolerance from previous sessions.  Requiring mod/max assist +2 for sit/stand from all surfaces and transfers between seating surfaces.  Unable to tolerate upright sitting greater than 1-2 minutes due to pain (returned to bed end of session as a result).  Constant cuing for task sequencing and overall safety.  Goals and POC from initial evaluation remain appropriate; date extended to reflect ongoing POC.  Of note, scheduled for surgical evacuation of extensive epidural abscess next date; will continue to follow orders for therapy involvement  post-procedure.     Recommendations for follow up therapy are one component of a multi-disciplinary discharge planning process, led by the attending physician.  Recommendations may be updated based on patient status, additional functional criteria and insurance authorization.  Follow Up Recommendations  Skilled nursing-short term rehab (<3 hours/day) Can patient physically be transported by private vehicle: No   Assistance Recommended at Discharge Frequent or constant Supervision/Assistance  Patient can return home with the following Two people to help with walking and/or transfers;A lot of help with bathing/dressing/bathroom;Assistance with cooking/housework;Direct supervision/assist for medications management;Direct supervision/assist for financial management;Assist for transportation;Help with stairs or ramp for entrance   Equipment Recommendations       Recommendations for Other Services       Precautions / Restrictions Precautions Precautions: Fall Restrictions Weight Bearing Restrictions: No     Mobility  Bed Mobility Overal bed mobility: Needs Assistance Bed Mobility: Supine to Sit, Sit to Supine Rolling: Mod assist Sidelying to sit: Max assist, +2 for physical assistance Supine to sit: Max assist, +2 for physical assistance Sit to supine: Max assist, +2 for physical assistance        Transfers Overall transfer level: Needs assistance Equipment used: Rolling walker (2 wheels) Transfers: Sit to/from Stand Sit to Stand: Mod assist, From elevated surface Stand pivot transfers: Mod assist, Max assist, +2 physical assistance         General transfer comment: sit/stand from various surfaces (elevated hospital bed, standard recliner surface, BSC), mod/max assist +2.  Extensive cuing for hand placement; extensive assist for lift off, standing balance and walker positioning/management/transfer sequencing.  Does tend to attempt sitting prior to full alignment; max cuing  for safety and attention/readiness for task    Ambulation/Gait  General Gait Details: deferred due to toileting needs   Stairs             Wheelchair Mobility    Modified Rankin (Stroke Patients Only)       Balance Overall balance assessment: Needs assistance Sitting-balance support: No upper extremity supported, Feet supported Sitting balance-Leahy Scale: Fair     Standing balance support: Bilateral upper extremity supported Standing balance-Leahy Scale: Poor                              Cognition Arousal/Alertness: Awake/alert Behavior During Therapy: WFL for tasks assessed/performed Overall Cognitive Status: Difficult to assess                                 General Comments: oriented to basic information, but noted difficulty with problem-solving and sequencing basic tasks        Exercises Other Exercises Other Exercises: Toilet transfer, SPT with RW, mod/max assist +2; standing balance for pericare with RW, mod/max assist +2; dep for peri-care    General Comments        Pertinent Vitals/Pain Pain Assessment Pain Assessment: Faces Faces Pain Scale: Hurts whole lot Pain Location: back pain Pain Descriptors / Indicators: Grimacing, Guarding, Discomfort, Sharp, Shooting, Moaning Pain Intervention(s): Limited activity within patient's tolerance, Monitored during session, Repositioned, Patient requesting pain meds-RN notified    Home Living                          Prior Function            PT Goals (current goals can now be found in the care plan section) Acute Rehab PT Goals Patient Stated Goal: none stated PT Goal Formulation: With patient Time For Goal Achievement: 06/18/22 Potential to Achieve Goals: Fair Progress towards PT goals: Progressing toward goals    Frequency    Min 2X/week      PT Plan Current plan remains appropriate    Co-evaluation              AM-PAC  PT "6 Clicks" Mobility   Outcome Measure  Help needed turning from your back to your side while in a flat bed without using bedrails?: A Lot Help needed moving from lying on your back to sitting on the side of a flat bed without using bedrails?: A Lot Help needed moving to and from a bed to a chair (including a wheelchair)?: A Lot Help needed standing up from a chair using your arms (e.g., wheelchair or bedside chair)?: A Lot Help needed to walk in hospital room?: Total Help needed climbing 3-5 steps with a railing? : Total 6 Click Score: 10    End of Session Equipment Utilized During Treatment: Gait belt Activity Tolerance: Patient limited by pain Patient left: in bed;with call bell/phone within reach;with bed alarm set Nurse Communication: Mobility status PT Visit Diagnosis: Unsteadiness on feet (R26.81);Other abnormalities of gait and mobility (R26.89);Muscle weakness (generalized) (M62.81);Repeated falls (R29.6);Pain;Difficulty in walking, not elsewhere classified (R26.2)     Time: 7035-0093 PT Time Calculation (min) (ACUTE ONLY): 28 min  Charges:  $Therapeutic Activity: 23-37 mins                     Yaritzel Stange H. Owens Shark, PT, DPT, NCS 06/04/22, 5:08 PM [phone]

## 2022-06-04 NOTE — Plan of Care (Signed)

## 2022-06-04 NOTE — Progress Notes (Signed)
Consent form for Lumbar Laminectomy reviewed with care RN and signed by patient's POA. Placed in patient's chart.

## 2022-06-04 NOTE — Progress Notes (Signed)
    Attending Progress Note  History: Anthony [name] is here for severe back pain, lower extremity weakness, sepsis.  POD1: Doing well  06/02/22: After lack of improvement with antibiotics, his treating physicians feel it is most appropriate to move forward with removal of his stimulator.  Physical Exam: Vitals:   06/04/22 0538 06/04/22 0922  BP: 130/70 (!) 111/93  Pulse: 83 81  Resp: 18 17  Temp: 97.7 F (36.5 C) 98.1 F (36.7 C)  SpO2: 93% 90%    AA Ox3 CNI  Strength:5/5 throughout RLE  L IP 4-/5, KE and HS 4+, DF/PF 4+  Continues to have pain down both legs with standing  Data:  Recent Labs  Lab 06/02/22 0612 06/03/22 0953 06/04/22 0630  NA 136 135 131*  K 3.4* 4.0 4.5  CL 100 100 95*  CO2 32 32 30  BUN 13 14 26*  CREATININE 1.02 1.21 1.32*  GLUCOSE 145* 148* 146*  CALCIUM 9.7 9.6 10.0   Recent Labs  Lab 05/30/22 0830  AST 22  ALT 19  ALKPHOS 50     Recent Labs  Lab 06/02/22 0612 06/02/22 1439 06/03/22 0953 06/04/22 0630  WBC 16.4*  --  13.4* 14.5*  HGB 8.1*   < > 8.1* 7.4*  HCT 24.6*   < > 25.3* 23.6*  PLT 560*  --  635* 580*   < > = values in this interval not displayed.   No results for input(s): "APTT", "INR" in the last 168 hours.      MRI L spine 06/03/22 IMPRESSION: 1. Findings concerning for osteomyelitis discitis at L5-S1, with additional suspected changes of bilateral septic arthritis about the bilateral L4-5 facets. 2. Associated extensive epidural phlegmon/abscess extending from L1-2 through the sacrum with resultant moderate to severe diffuse spinal stenosis. 3. Diffuse soft tissue edema and enhancement throughout the lower paraspinous soft tissues, consistent with regional cellulitis/myositis. Probable 2.4 cm paraspinous abscess along the left anterolateral aspect of the lower vertebral column, emanating from the L5-S1 interspace as above. 4. Underlying advanced multilevel degenerative spondylosis as detailed above.    Critical Value/emergent results were called by telephone at the time of interpretation on 06/03/2022 at 7:37 pm to provider Sharion Settler, who verbally acknowledged these results.     Electronically Signed   By: Jeannine Boga M.D.   On: 06/03/2022 19:44  Assessment/Plan:  Anthony [name] is here with sepsis, severe back pain, weakness, and difficulty walking. He has had his spinal cord stimulator removed.  His MRI reveals extensive lumbar epidural abscess.  He has severe stenosis worst around L2 and L5/S1.    Due to the severe nature of his lumbar compression, I recommend surgical evacuation.  I will discuss with his primary hospitalist Dr. Arbutus Ped and arrange for the OR tomorrow.  I discussed the planned procedure at length with the patient, including the risks, benefits, alternatives, and indications. The risks discussed include but are not limited to bleeding, infection, need for reoperation, spinal fluid leak, stroke, vision loss, anesthetic complication, coma, paralysis, and even death. I also described in detail that improvement was not guaranteed.  The patient expressed understanding of these risks, and asked that we proceed with surgery. I described the surgery in layman's terms, and gave ample opportunity for questions, which were answered to the best of my ability.    Meade Maw MD, [facility] Department of Neurosurgery

## 2022-06-04 NOTE — Plan of Care (Signed)

## 2022-06-05 ENCOUNTER — Encounter: Payer: Self-pay | Admitting: Family Medicine

## 2022-06-05 ENCOUNTER — Inpatient Hospital Stay: Payer: Medicare Other

## 2022-06-05 ENCOUNTER — Encounter
Admission: EM | Disposition: A | Discharge status: Skilled Nursing Facility | Payer: Self-pay | Source: Home / Self Care | Attending: Family Medicine

## 2022-06-05 ENCOUNTER — Other Ambulatory Visit: Payer: Self-pay

## 2022-06-05 ENCOUNTER — Inpatient Hospital Stay: Payer: Medicare Other | Admitting: Certified Registered Nurse Anesthetist

## 2022-06-05 DIAGNOSIS — G061 Intraspinal abscess and granuloma: Secondary | ICD-10-CM

## 2022-06-05 DIAGNOSIS — B957 Other staphylococcus as the cause of diseases classified elsewhere: Secondary | ICD-10-CM | POA: Diagnosis not present

## 2022-06-05 DIAGNOSIS — R531 Weakness: Secondary | ICD-10-CM | POA: Diagnosis not present

## 2022-06-05 DIAGNOSIS — R7881 Bacteremia: Secondary | ICD-10-CM | POA: Diagnosis not present

## 2022-06-05 DIAGNOSIS — T85192A Other mechanical complication of implanted electronic neurostimulator (electrode) of spinal cord, initial encounter: Secondary | ICD-10-CM | POA: Diagnosis not present

## 2022-06-05 HISTORY — DX: Intraspinal abscess and granuloma: G06.1

## 2022-06-05 HISTORY — PX: LUMBAR LAMINECTOMY FOR EPIDURAL ABSCESS: SHX5956

## 2022-06-05 LAB — CBC
HCT: 25.5 % — ABNORMAL LOW (ref 39.0–52.0)
Hemoglobin: 8.2 g/dL — ABNORMAL LOW (ref 13.0–17.0)
MCH: 30.7 pg (ref 26.0–34.0)
MCHC: 32.2 g/dL (ref 30.0–36.0)
MCV: 95.5 fL (ref 80.0–100.0)
Platelets: 635 10*3/uL — ABNORMAL HIGH (ref 150–400)
RBC: 2.67 MIL/uL — ABNORMAL LOW (ref 4.22–5.81)
RDW: 14.9 % (ref 11.5–15.5)
WBC: 15.2 10*3/uL — ABNORMAL HIGH (ref 4.0–10.5)
nRBC: 0 % (ref 0.0–0.2)

## 2022-06-05 LAB — BASIC METABOLIC PANEL
Anion gap: 7 (ref 5–15)
BUN: 26 mg/dL — ABNORMAL HIGH (ref 8–23)
CO2: 31 mmol/L (ref 22–32)
Calcium: 9.8 mg/dL (ref 8.9–10.3)
Chloride: 97 mmol/L — ABNORMAL LOW (ref 98–111)
Creatinine, Ser: 1.29 mg/dL — ABNORMAL HIGH (ref 0.61–1.24)
GFR, Estimated: 55 mL/min — ABNORMAL LOW (ref >60)
Glucose, Bld: 118 mg/dL — ABNORMAL HIGH (ref 70–99)
Potassium: 4.4 mmol/L (ref 3.5–5.1)
Sodium: 135 mmol/L (ref 135–145)

## 2022-06-05 SURGERY — LUMBAR LAMINECTOMY FOR EPIDURAL ABSCESS
Anesthesia: General | Site: Spine Lumbar | Laterality: Bilateral

## 2022-06-05 MED ORDER — SURGIFLO WITH THROMBIN (HEMOSTATIC MATRIX KIT) OPTIME
TOPICAL | Status: DC | PRN
  Administered 2022-06-05: 1 via TOPICAL

## 2022-06-05 MED ORDER — FENTANYL CITRATE (PF) 100 MCG/2ML IJ SOLN: 25.0000 ug | INTRAMUSCULAR | Status: DC | PRN

## 2022-06-05 MED ORDER — ROCURONIUM BROMIDE 100 MG/10ML IV SOLN
INTRAVENOUS | Status: DC | PRN
  Administered 2022-06-05: 40 mg via INTRAVENOUS

## 2022-06-05 MED ORDER — PROPOFOL 10 MG/ML IV BOLUS
INTRAVENOUS | Status: DC | PRN
  Administered 2022-06-05: 100 mg via INTRAVENOUS

## 2022-06-05 MED ORDER — EPHEDRINE 5 MG/ML INJ
INTRAVENOUS | Status: AC
  Filled 2022-06-05: qty 5

## 2022-06-05 MED ORDER — BUPIVACAINE LIPOSOME 1.3 % IJ SUSP
INTRAMUSCULAR | Status: AC
  Filled 2022-06-05: qty 20

## 2022-06-05 MED ORDER — BUPIVACAINE-EPINEPHRINE (PF) 0.5% -1:200000 IJ SOLN
INTRAMUSCULAR | Status: DC | PRN
  Administered 2022-06-05: 2 mL

## 2022-06-05 MED ORDER — METHYLPREDNISOLONE ACETATE 40 MG/ML IJ SUSP
INTRAMUSCULAR | Status: AC
  Filled 2022-06-05: qty 1

## 2022-06-05 MED ORDER — BUPIVACAINE-EPINEPHRINE (PF) 0.5% -1:200000 IJ SOLN
INTRAMUSCULAR | Status: AC
  Filled 2022-06-05: qty 30

## 2022-06-05 MED ORDER — DEXAMETHASONE SODIUM PHOSPHATE 10 MG/ML IJ SOLN
INTRAMUSCULAR | Status: DC | PRN
  Administered 2022-06-05: 10 mg via INTRAVENOUS

## 2022-06-05 MED ORDER — 0.9 % SODIUM CHLORIDE (POUR BTL) OPTIME
TOPICAL | Status: DC | PRN
  Administered 2022-06-05: 500 mL

## 2022-06-05 MED ORDER — EPHEDRINE SULFATE (PRESSORS) 50 MG/ML IJ SOLN
INTRAMUSCULAR | Status: DC | PRN
  Administered 2022-06-05: 10 mg via INTRAVENOUS
  Administered 2022-06-05: 5 mg via INTRAVENOUS
  Administered 2022-06-05: 10 mg via INTRAVENOUS

## 2022-06-05 MED ORDER — BUPIVACAINE HCL (PF) 0.5 % IJ SOLN
INTRAMUSCULAR | Status: AC
  Filled 2022-06-05: qty 30

## 2022-06-05 MED ORDER — LIDOCAINE HCL (CARDIAC) PF 100 MG/5ML IV SOSY
PREFILLED_SYRINGE | INTRAVENOUS | Status: DC | PRN
  Administered 2022-06-05: 80 mg via INTRAVENOUS

## 2022-06-05 MED ORDER — SUGAMMADEX SODIUM 200 MG/2ML IV SOLN
INTRAVENOUS | Status: DC | PRN
  Administered 2022-06-05: 200 mg via INTRAVENOUS

## 2022-06-05 MED ORDER — VASOPRESSIN 20 UNIT/ML IV SOLN
INTRAVENOUS | Status: DC | PRN
  Administered 2022-06-05 (×5): 1 [IU] via INTRAVENOUS

## 2022-06-05 MED ORDER — ONDANSETRON HCL 4 MG/2ML IJ SOLN
INTRAMUSCULAR | Status: DC | PRN
  Administered 2022-06-05: 4 mg via INTRAVENOUS

## 2022-06-05 MED ORDER — FENTANYL CITRATE (PF) 100 MCG/2ML IJ SOLN
INTRAMUSCULAR | Status: DC | PRN
  Administered 2022-06-05 (×4): 25 ug via INTRAVENOUS

## 2022-06-05 MED ORDER — PHENYLEPHRINE HCL (PRESSORS) 10 MG/ML IV SOLN
INTRAVENOUS | Status: DC | PRN
  Administered 2022-06-05: 160 ug via INTRAVENOUS
  Administered 2022-06-05: 80 ug via INTRAVENOUS
  Administered 2022-06-05 (×4): 160 ug via INTRAVENOUS

## 2022-06-05 MED ORDER — PHENYLEPHRINE HCL-NACL 20-0.9 MG/250ML-% IV SOLN
INTRAVENOUS | Status: DC | PRN
  Administered 2022-06-05: 30 ug/min via INTRAVENOUS

## 2022-06-05 MED ORDER — ONDANSETRON HCL 4 MG/2ML IJ SOLN: 4.0000 mg | Freq: Once | INTRAMUSCULAR | Status: DC | PRN

## 2022-06-05 MED ORDER — SODIUM CHLORIDE FLUSH 0.9 % IV SOLN
INTRAVENOUS | Status: AC
  Filled 2022-06-05: qty 20

## 2022-06-05 MED ORDER — FENTANYL CITRATE (PF) 100 MCG/2ML IJ SOLN
INTRAMUSCULAR | Status: AC
  Filled 2022-06-05: qty 2

## 2022-06-05 MED ORDER — LACTATED RINGERS IV SOLN: INTRAVENOUS | Status: DC | PRN

## 2022-06-05 MED ORDER — PROPOFOL 1000 MG/100ML IV EMUL
INTRAVENOUS | Status: AC
  Filled 2022-06-05: qty 100

## 2022-06-05 SURGICAL SUPPLY — 49 items
BASIN KIT SINGLE STR (MISCELLANEOUS) ×1 IMPLANT
BUR NEURO DRILL SOFT 3.0X3.8M (BURR) ×1 IMPLANT
CHLORAPREP W/TINT 26 (MISCELLANEOUS) ×1 IMPLANT
CNTNR SPEC 2.5X3XGRAD LEK (MISCELLANEOUS) ×1
CONT SPEC 4OZ STER OR WHT (MISCELLANEOUS) ×1
CONTAINER SPEC 2.5X3XGRAD LEK (MISCELLANEOUS) ×1 IMPLANT
DERMABOND ADVANCED .7 DNX12 (GAUZE/BANDAGES/DRESSINGS) ×1 IMPLANT
DRAPE C ARM PK CFD 31 SPINE (DRAPES) ×1 IMPLANT
DRAPE LAPAROTOMY 100X77 ABD (DRAPES) ×1 IMPLANT
DRAPE MICROSCOPE SPINE 48X150 (DRAPES) ×1 IMPLANT
DRAPE SURG 17X11 SM STRL (DRAPES) ×1 IMPLANT
DRSG OPSITE POSTOP 3X4 (GAUZE/BANDAGES/DRESSINGS) IMPLANT
DRSG OPSITE POSTOP 4X6 (GAUZE/BANDAGES/DRESSINGS) IMPLANT
ELECT EZSTD 165MM 6.5IN (MISCELLANEOUS)
ELECT REM PT RETURN 9FT ADLT (ELECTROSURGICAL) ×1
ELECTRODE EZSTD 165MM 6.5IN (MISCELLANEOUS) IMPLANT
ELECTRODE REM PT RTRN 9FT ADLT (ELECTROSURGICAL) ×1 IMPLANT
GLOVE BIOGEL PI IND STRL 6.5 (GLOVE) ×1 IMPLANT
GLOVE BIOGEL PI IND STRL 8.5 (GLOVE) ×1 IMPLANT
GLOVE SURG SYN 6.5 ES PF (GLOVE) ×2 IMPLANT
GLOVE SURG SYN 6.5 PF PI (GLOVE) ×2 IMPLANT
GLOVE SURG SYN 8.5  E (GLOVE) ×3
GLOVE SURG SYN 8.5 E (GLOVE) ×3 IMPLANT
GLOVE SURG SYN 8.5 PF PI (GLOVE) ×3 IMPLANT
GOWN SRG LRG LVL 4 IMPRV REINF (GOWNS) ×1 IMPLANT
GOWN SRG XL LVL 3 NONREINFORCE (GOWNS) ×1 IMPLANT
GOWN STRL NON-REIN TWL XL LVL3 (GOWNS) ×1
GOWN STRL REIN LRG LVL4 (GOWNS) ×1
HEMOVAC 400CC 10FR (MISCELLANEOUS) IMPLANT
KIT SPINAL PRONEVIEW (KITS) ×1 IMPLANT
MANIFOLD NEPTUNE II (INSTRUMENTS) ×1 IMPLANT
MARKER SKIN DUAL TIP RULER LAB (MISCELLANEOUS) ×1 IMPLANT
NDL SAFETY ECLIP 18X1.5 (MISCELLANEOUS) ×1 IMPLANT
NS IRRIG 1000ML POUR BTL (IV SOLUTION) ×1 IMPLANT
NS IRRIG 500ML POUR BTL (IV SOLUTION) IMPLANT
PACK LAMINECTOMY NEURO (CUSTOM PROCEDURE TRAY) ×1 IMPLANT
PAD ARMBOARD 7.5X6 YLW CONV (MISCELLANEOUS) ×1 IMPLANT
SURGIFLO W/THROMBIN 8M KIT (HEMOSTASIS) ×1 IMPLANT
SUT DVC VLOC 3-0 CL 6 P-12 (SUTURE) ×1 IMPLANT
SUT ETHILON 3-0 FS-10 30 BLK (SUTURE) ×2
SUT VIC AB 0 CT1 27 (SUTURE) ×1
SUT VIC AB 0 CT1 27XCR 8 STRN (SUTURE) ×1 IMPLANT
SUT VIC AB 2-0 CT1 18 (SUTURE) ×1 IMPLANT
SUTURE EHLN 3-0 FS-10 30 BLK (SUTURE) IMPLANT
SYR 10ML LL (SYRINGE) ×2 IMPLANT
SYR 30ML LL (SYRINGE) ×2 IMPLANT
SYR 3ML LL SCALE MARK (SYRINGE) ×1 IMPLANT
TRAP FLUID SMOKE EVACUATOR (MISCELLANEOUS) ×1 IMPLANT
WATER STERILE IRR 1000ML POUR (IV SOLUTION) ×2 IMPLANT

## 2022-06-05 NOTE — Progress Notes (Signed)
PROGRESS NOTE    Anthony [name]  EXB:[id number] DOB: 02-Oct-1935 DOA: 05/22/2022  PCP: Ria Bush, MD   Brief Narrative: This 86 yrs old male with medical history significant of atrial flutter,  status post ablation, chronic back pain status post spine stimulator which is malfunctioning, hypertension, hyperlipidemia, hypothyroidism, obesity, obstructive sleep apnea on CPAP comes to the emergency room with complaints of lower back pain radiating bilaterally to both legs,  going down along with increased urinary frequency.  Patient is found to have Staph aureus bacteremia, back pain is concerning for epidural abscess/early discitis.  Infectious diseases consulted, recommended IV antibiotics.  Neurosurgery was consulted,  states No neurosurgical intervention needed, recommended IV steroids and IV antibiotics.  Echo: no evidence of vegetation.  LVEF 10%, grade 1 diastolic dysfunction. CT lumbar spine with no evidence of abscess or discitis or osteomyelitis.  Patient continued to have leukocytosis despite being on Nafcillin, UA with positive LE, started on cefepime.  ID recommended 6 weeks of IV antibiotics if we are not pursuing TEE.    Hospital course complicated by acute GI bleeding with anemia requiring blood transfusion.  EGD was performed and patient found to have peptic ulcer disease.  Ongoing evaluation for severe back pain including removal of nonfunctioning spinal stimulator in order to undergo lumbar MRI.  Neurosurgery following.  Further details of hospital course as below.  Assessment & Plan:   Principal Problem:   Sepsis ([place]) Active Problems:   Coag negative Staphylococcus bacteremia   Gastrointestinal hemorrhage with melena   Failed spinal cord stimulator ([facility])   Weakness  Severe sepsis POA:  Staph Capitis bacteremia: He was found to have tachycardia , elevated white count of 21K, lactic acid of 2.0, increased creatinine to 2.6 from baseline 1.3. Lactic acid  normalized. Infectious disease Dr. Delaine Lame consulted.   Repeat blood cultures: No growth  Echo with no evidence of vegetation CT lumbar spine: no evidence of discitis, abscess or osteomyelitis. WBC count remains elevated, improving. UA positive LE no UTI sx's, chest x-ray unremarkable. Cefepime added for persistent leukocytosis. Discussed with ID, resumed of nafcillin  ID recommended 6 weeks of IV antibiotics if we are not pursuing TEE. Currently on Ancef.  Chronic back pain with severe degenerative arthritis: Chronic pain /history of spinal cord pain stimulator-- malfunctioning. Patient follows at pain clinic with Dr. Holley Raring He recently underwent lumbar spinal steroid injection on 05/17/2022.0 CRP 42.2, ESR 10.0 Neurosurgery consulting - Dr. Cari Caraway Pt has increased weakness in LE's left more than right 10/7: Spinal stimulator removed  10/7: Lumbar MRI w/wo contrast shows extensive infection 10/8: Neurosurgery plans for OR today for wash out and likely laminectomy to relieve compression Started scheduled Tylenol 1000 mg TID Continue PRN PO and IV pain meds  Trial of low dose Robaxin was too sedating for pt - d/c'd Continue gabapentin. CT lumbar spine no evidence of discitis, abscess or osteomyelitis.   AKI on CKD stage IIIb:  Baseline creatinine 1.3 Presented  with creatinine of 2.62--2.18 >2.25>1.42 > ...1.15>1.09>1.02>1.21>1.32 Now off IV fluids Avoid nephrotoxic agents AKI resolved, now recurrent but mild, after being NPO for surgery yesterday and sedated, little PO intake yesterday --encourage PO hydration.    Hypokalemia:  Replaced on 10/4 for K3.3.   Monitor BMP and replace as needed. K 3.4 today, will replace.   Leukocytosis: improving Likely reactive due to steroids versus to infection. --WBC remains elevated, now slowly improving Patient remained on daptomycin for staph bacteremia. Antibiotic changed to nafcillin. UA+ LE Added cefepime  for gram-negative  coverage. Now on Ancef   Elevated troponin: Likely demand ischemia in the setting of sepsis. Patient denies any chest pain, dizziness, palpitations. Hx of CAD with stent placed in LAD few years ago Hx. of atrial flutter status post ablation in the past.  Patient follows with [facility] cardiology --Aspirin and Plavix is on hold.   --Continue statin   Reactive thrombocytosis Monitor CBC  Hypothyroidism Continue Synthroid.   Hyperlipidemia  Continue Zetia.  Hypokalemia: Replaced.  Continue to monitor   Upper GI bleed: Acute blood loss anemia: Patient had 2 dark black stools. Hb trended down 10.7>9.7>7.7>8.5>7.0.Marland KitchenMarland Kitchen  Now stable Hbg 8.3 >> 8.1 >>8.3>>8.1>>7.4>>8.1>>8.2 10/6 --nursing reported recurrent dark stools, hemoglobin and stable 10/8 - Hbg 7.4, down from 8.1 after stimulator removal in OR yesterday.  Reoeat in afternoon was 8.1 again. GI consulted earlier this admission.   Patient underwent EGD on 10/3 which showed gastric and duodenal ulcers all nonbleeding with clean ulcer base ([place] class III) RBC tagged scan negative for acute bleeding. Transfused 1 unit PRBC. Hold Plavix and aspirin.    DVT prophylaxis:SCDs Code Status: DNR Family Communication: Patient's son updated in hallway on the unit 10/5.  Will attempt to call  Disposition Plan:   Status is: Inpatient Remains inpatient appropriate because:  On IV antibiotics for bacteremia with ongoing evaluation.   Pending lumbar MRI for further evaluation of severe uncontrolled back pain. PT recommended SNF.      Consultants:  Neurosurgery Infectious disease  Procedures: None  Antimicrobials: Daptomycin, > nafcillin > Cefepime > Ancef  Subjective: Patient sleeping comfortably when seen this morning, he woke easily to voice but was quick to fall back to sleep.  He is aware of going to OR today.  He offers no acute complaints.    Objective: Vitals:   06/04/22 2059 06/05/22 0557 06/05/22 0918 06/05/22 1402   BP: (!) 147/81 (!) 157/92 135/89 (!) 146/66  Pulse: 100 (!) 102 (!) 106 (!) 105  Resp: '16 16 18 19  ' Temp: 99.5 F (37.5 C) 98.1 F (36.7 C) 100.1 F (37.8 C)   TempSrc: Oral     SpO2: 90% 93% 92% 95%  Weight:      Height:        Intake/Output Summary (Last 24 hours) at 06/05/2022 1435 Last data filed at 06/05/2022 0820 Gross per 24 hour  Intake --  Output 1300 ml  Net -1300 ml   Filed Weights   05/30/22 1028 05/31/22 1334  Weight: 122.5 kg 109.1 kg    Examination:  General exam: sleeping comfortably, woke to voice, no acute distress HEENT: Moist mucous membranes, patches of erythema and skin flaking on face and forehead improved today, hearing grossly normal  Respiratory system: CTAB, no wheezes, rales or rhonchi, normal respiratory effort. Cardiovascular system: normal S1/S2, RRR, no peripheral edema.   Gastrointestinal system: soft, NT, ND Central nervous system: Alert and oriented x4 grossly nonfocal exam, limited by somnolence Skin: dry, intact, normal temperature Psychiatry: normal mood, congruent affect, judgement and insight appear normal     Data Reviewed:   Notable labs --- Na normalized 135 from 131, Cl 97, glucose 118, Cr 1.29 from 1.32 very mildly elevated, WBC 15.2 from 14., Hbg 8.2 from 8.1 and 7.4 yesterday. Platelets 635 increasing again (reactive most likely)  Microbiology ---  Blood cultures from 9/25 grew Staph capitis. Repeat blood cultures from 9/29 - no growth, finalized 10/4.Marland Kitchen Urine culture from 10/1 - no growth.  MRI lumbar spine with extensive infection --  discitis/osteomyelitis at L5-S1, suspected septic arthritis at bilateral facets at L4-5, extensive epidural phlegmon/abscess extending from L1-2 down through the sacrum with moderate to severe diffuse spinal stenosis, soft tissue edema and enhancement of the lower paraspinal soft tissues (cellulitis/myositis), 2.4 cm paraspinal abscess along the left anterolateral vertebral column from  L5-S1.  Procedures: Echocardiogram and CT myelogram completed -- no evidence of vegetation, discitis, osteomyelitis or abscess.    EGD on 10/3 --- notable for multiple nonbleeding clean-based ulcers, one in the stomach and 2 in the duodenum.  10/7 - spinal stimulator removed     LOS: 14 days    Time spent: 35 minutes    Ezekiel Slocumb, DO [facility]   If 7PM-7AM, please contact night-coverage

## 2022-06-05 NOTE — Anesthesia Preprocedure Evaluation (Signed)
Anesthesia Evaluation  Patient identified by MRN, date of birth, ID band Patient awake    Reviewed: Allergy & Precautions, NPO status , Patient's Chart, lab work & pertinent test results  History of Anesthesia Complications (+) PONV and history of anesthetic complications  Airway Mallampati: III  TM Distance: <3 FB Neck ROM: full    Dental  (+) Missing   Pulmonary shortness of breath and with exertion, sleep apnea ,    Pulmonary exam normal        Cardiovascular hypertension, + CAD and +CHF  Normal cardiovascular exam     Neuro/Psych PSYCHIATRIC DISORDERS Depression  Neuromuscular disease    GI/Hepatic Neg liver ROS, GERD  ,  Endo/Other  Hypothyroidism   Renal/GU Renal disease     Musculoskeletal   Abdominal   Peds  Hematology negative hematology ROS (+)   Anesthesia Other Findings Past Medical History: 1980s: Anosmia 2018: Atrial flutter ([place])     Comment:  had an ablation with dr. Erick Alley 01/11/2022: Basal cell carcinoma     Comment:  L cheek - excised 04/25/22 No date: CAD (coronary artery disease) No date: Cancer ([place])     Comment:  skin cancer No date: Chronic constipation No date: Chronic insomnia No date: Chronic lower back pain     Comment:  s/p spine stimulator placement No date: Complication of anesthesia     Comment:  when under general, he woke up agitated and unable to be              held down No date: Diastolic CHF ([place]) 1191: History of diverticulitis 2013: History of pneumonia No date: Hyperlipidemia No date: Hypertension No date: Hypothyroidism No date: Obesity, Class II, BMI 35-39.9, with comorbidity No date: OSA on CPAP No date: PONV (postoperative nausea and vomiting)  Past Surgical History: 05/07/2017: A-FLUTTER ABLATION; N/A     Comment:  Procedure: A-Flutter Ablation;  Surgeon: Deboraha Sprang, MD;  Location: [facility];  Service:                Cardiovascular;  Laterality: N/A; 11/2017: CARDIOVASCULAR STRESS TEST     Comment:  no ischemia, low risk study 2007: COLONOSCOPY     Comment:  normal per prior PCP records, rpt 10 yrs (Dr Osie Cheeks) No date: ELBOW SURGERY; Right     Comment:  ulnar nerve decompression.  PT DOES NOT RECALL THIS               PROCEDURE 05/05/2020: ENDOVASCULAR REPAIR/STENT GRAFT; N/A     Comment:  Procedure: ENDOVASCULAR REPAIR/STENT GRAFT;  Surgeon:               Algernon Huxley, MD;  Location: [facility];                Service: Cardiovascular;  Laterality: N/A; 05/30/2022: ESOPHAGOGASTRODUODENOSCOPY (EGD) WITH PROPOFOL; N/A     Comment:  Procedure: ESOPHAGOGASTRODUODENOSCOPY (EGD) WITH               PROPOFOL;  Surgeon: Jonathon Bellows, MD;  Location: [facility]               ENDOSCOPY;  Service: Gastroenterology;  Laterality: N/A; 08/2018: LAMINECTOMY THORACIC SPINE W/ PLACEMENT SPINAL CORD  STIMULATOR     Comment:  and removal of prior spine stimulator (Dr Lacinda Axon) No date: NASAL SINUS SURGERY     Comment:  nasal polyps. done a  long time ago 2013: [place] (PCI-S)     Comment:  EF55%, 70% mid LAD, 99% mid RCA, mild MR, elev LVEDP,               DES to mid LAD. RCA is nondominant 06/2017: RADIOFREQUENCY ABLATION     Comment:  lumbar region ([place])  2000s: REPLACEMENT TOTAL KNEE BILATERAL; Bilateral 06/2016: SPINAL CORD STIMULATOR IMPLANT 09/09/2018: SPINAL CORD STIMULATOR INSERTION; N/A     Comment:  Procedure: LUMBAR SPINAL CORD STIMULATOR LEAD AND               BATTERY REMOVAL AND LAMINECTOMY FOR PLACEMENT OF PADDLE;               Surgeon: Deetta Perla, MD;  Location: [facility];  Service:              Neurosurgery;  Laterality: N/A; 09/16/2018: SPINAL CORD STIMULATOR INSERTION; N/A     Comment:  Procedure: PLACEMENT OF SPINAL CORD STIMULATOR BATTERY;               Surgeon: Deetta Perla, MD;  Location: [facility];  Service:              Neurosurgery;  Laterality: N/A;  BMI     Body Mass Index: 32.62 kg/m      Reproductive/Obstetrics negative OB ROS                             Anesthesia Physical  Anesthesia Plan  ASA: 3  Anesthesia Plan: General ETT   Post-op Pain Management:    Induction: Intravenous  PONV Risk Score and Plan: Ondansetron, Dexamethasone, Midazolam and Treatment may vary due to age or medical condition  Airway Management Planned: Oral ETT  Additional Equipment:   Intra-op Plan:   Post-operative Plan: Extubation in OR  Informed Consent: I have reviewed the patients History and Physical, chart, labs and discussed the procedure including the risks, benefits and alternatives for the proposed anesthesia with the patient or authorized representative who has indicated his/her understanding and acceptance.   Patient has DNR.  Discussed DNR with patient and Suspend DNR.   Dental Advisory Given  Plan Discussed with: Anesthesiologist, CRNA and Surgeon  Anesthesia Plan Comments: (Patient consented for risks of anesthesia including but not limited to:  - adverse reactions to medications - damage to eyes, teeth, lips or other oral mucosa - nerve damage due to positioning  - sore throat or hoarseness - Damage to heart, brain, nerves, lungs, other parts of body or loss of life  Patient voiced understanding.)       Anesthesia Quick Evaluation

## 2022-06-05 NOTE — Anesthesia Postprocedure Evaluation (Signed)
Anesthesia Post Note  [name]  Procedure(s) Performed: LUMBAR 2- L3, L5-S1 LAMINECTOMY FOR EPIDURAL ABSCESS (Bilateral: Spine Lumbar)  Patient location during evaluation: PACU Anesthesia Type: General Level of consciousness: awake and alert Pain management: pain level controlled Vital Signs Assessment: post-procedure vital signs reviewed and stable Respiratory status: spontaneous breathing, nonlabored ventilation, respiratory function stable and patient connected to nasal cannula oxygen Cardiovascular status: blood pressure returned to baseline and stable Postop Assessment: no apparent nausea or vomiting Anesthetic complications: no   No notable events documented.   Last Vitals:  Vitals:   06/05/22 1745 06/05/22 1809  BP: 123/73 131/86  Pulse: 100 93  Resp: 18 20  Temp: (!) 36.2 C 36.9 C  SpO2: 96% 94%    Last Pain:  Vitals:   06/05/22 1745  TempSrc:   PainSc: 0-No pain                 Arita Miss

## 2022-06-05 NOTE — Anesthesia Procedure Notes (Signed)
Procedure Name: Intubation Date/Time: 06/05/2022 2:40 PM  Performed by: Lowry Bowl, CRNAPre-anesthesia Checklist: Patient identified, Emergency Drugs available, Suction available and Patient being monitored Patient Re-evaluated:Patient Re-evaluated prior to induction Oxygen Delivery Method: Circle system utilized Preoxygenation: Pre-oxygenation with 100% oxygen Induction Type: IV induction Ventilation: Mask ventilation without difficulty Laryngoscope Size: McGraph and 4 Grade View: Grade II Tube type: Oral Tube size: 7.5 mm Number of attempts: 1 Airway Equipment and Method: Stylet and Video-laryngoscopy Placement Confirmation: ETT inserted through vocal cords under direct vision, positive ETCO2 and breath sounds checked- equal and bilateral Secured at: 22 cm Tube secured with: Tape Dental Injury: Teeth and Oropharynx as per pre-operative assessment

## 2022-06-05 NOTE — Plan of Care (Signed)

## 2022-06-05 NOTE — Interval H&P Note (Signed)
History and Physical Interval Note:  06/05/2022 1:52 PM  Anthony [name]  has presented today for surgery, with the diagnosis of epidural abscess.  The various methods of treatment have been discussed with the patient and family. After consideration of risks, benefits and other options for treatment, the patient has consented to  Procedure(s): [place] (Bilateral) as a surgical intervention.  The patient's history has been reviewed, patient examined, no change in status, stable for surgery.  I have reviewed the patient's chart and labs.  Questions were answered to the patient's satisfaction.    Heart sounds normal no MRG. Chest Clear to Auscultation Bilaterally.    Shomari Scicchitano

## 2022-06-05 NOTE — Progress Notes (Signed)
PT Cancellation Note  Patient Details Name: Anthony [name] MRN: [id number] DOB: [birth year]   Cancelled Treatment:    Reason Eval/Treat Not Completed: Medical issues which prohibited therapy (Patient scheduled for neurosurgical procedure this date.  Per guidelines, will require new/continue upon transfer orders to resume PT service post-procedure.  Please update/reconsult as appropriate.)  [name] H. Owens Shark, PT, DPT, NCS 06/05/22, 10:12 AM [phone]

## 2022-06-05 NOTE — Transfer of Care (Signed)
Immediate Anesthesia Transfer of Care Note  [name]  Procedure(s) Performed: LUMBAR 2- L3, L5-S1 LAMINECTOMY FOR EPIDURAL ABSCESS (Bilateral: Spine Lumbar)  Patient Location: PACU  Anesthesia Type:General  Level of Consciousness: awake  Airway & Oxygen Therapy: Patient Spontanous Breathing and Patient connected to face mask oxygen  Post-op Assessment: Report given to RN and Post -op Vital signs reviewed and stable  Post vital signs: Reviewed  Last Vitals:  Vitals Value Taken Time  BP    Temp    Pulse 105 06/05/22 1653  Resp    SpO2 94 % 06/05/22 1653  Vitals shown include unvalidated device data.  Last Pain:      Patients Stated Pain Goal: 3 (03/70/96 4383)  Complications: No notable events documented.

## 2022-06-05 NOTE — Op Note (Addendum)
Indications: [name] is suffering from lumbar epidural abscess.  He has had continued difficulty walking and weakness.  Due to excessive epidural pus, evacuation was recommended for source control.  Anthony [name] underwent removal of his spinal cord stimulator on Saturday.  He was then found to have an epidural abscess requiring a second unrelated surgical procedure.   Findings: epidural abscess L2-3  Preoperative Diagnosis: Lumbar Epidural abscess Postoperative Diagnosis: same   EBL: 25 ml IVF: see AR ml Drains: 2 placed Disposition: Extubated and Stable to PACU Complications: none   Preoperative Note:   Risks of surgery discussed include: infection, bleeding, stroke, coma, death, paralysis, CSF leak, nerve/spinal cord injury, numbness, tingling, weakness, complex regional pain syndrome, recurrent stenosis and/or disc herniation, vascular injury, development of instability, neck/back pain, need for further surgery, persistent symptoms, development of deformity, and the risks of anesthesia. The patient understood these risks and agreed to proceed.  Operative Note:   1. L2-3 and L5-S1 lumbar laminectomy for evacuation of epidural abscess    The patient was then brought from the preoperative center with intravenous access established.  The patient underwent general anesthesia and endotracheal tube intubation, and was then rotated on the [place] rail top where all pressure points were appropriately padded.  The skin was then thoroughly cleansed.  Perioperative antibiotic prophylaxis was administered.  Sterile prep and drapes were then applied and a timeout was then observed.  C-arm was brought into the field under sterile conditions and under lateral visualization the L5-S1 and L2-3 interspace were identified and marked.  The lower incision was marked and injected.  The incision was made, then Metrix tubes sequentially advanced and confirmed into position at L5-S1.  The high-speed  drill was then used to make a laminotomy defect which was extended until the ligamentum flavum was identified.  This was resected with a 2 mm and 3 mm Kerrison punch.  The dura was identified.  Extensive epidural phlegmon was noted.  This was resected and cultured.  After most of the epidural phlegmon had been removed, a drain was placed and the tube removed.  We then moved to the L2-3 level.  At L2-3, the incision was made and the Metrix tubes sequentially placed until the tubular retractor was docked at the L2-3 level.  The high-speed drill was then used to make a laminotomy which was extended until the ligamentum flavum was identified.  This was dissected free and then removed with a 3 mm punch.  Epidural pus was encountered.  This was cultured.  A portion of this was sent off for pathology as well.  After decompression and removal of as much pus as possible, a drain was placed and the tube removed.  Both incisions were irrigated.  The incisions were closed in layers using 0 and 2-0 Vicryl with 3-0 Monocryl on the skin.  Dermabond was placed.  The drains were secured.     Patient was then rotated back to the preoperative bed awakened from anesthesia and taken to recovery all counts are correct in this case.  I performed the entire procedure with the assistance of Geronimo Boot PA as an Pensions consultant. An assistant was required for this procedure due to the complexity.  The assistant provided assistance in tissue manipulation and suction, and was required for the successful and safe performance of the procedure. I performed the critical portions of the procedure.   Chrishon Martino K. Izora Ribas MD

## 2022-06-06 ENCOUNTER — Ambulatory Visit (INDEPENDENT_AMBULATORY_CARE_PROVIDER_SITE_OTHER): Payer: Medicare Other | Admitting: Vascular Surgery

## 2022-06-06 ENCOUNTER — Encounter (INDEPENDENT_AMBULATORY_CARE_PROVIDER_SITE_OTHER): Payer: Self-pay

## 2022-06-06 ENCOUNTER — Other Ambulatory Visit (INDEPENDENT_AMBULATORY_CARE_PROVIDER_SITE_OTHER): Payer: Medicare Other

## 2022-06-06 ENCOUNTER — Encounter: Payer: Self-pay | Admitting: Neurosurgery

## 2022-06-06 DIAGNOSIS — M4647 Discitis, unspecified, lumbosacral region: Secondary | ICD-10-CM

## 2022-06-06 DIAGNOSIS — R7881 Bacteremia: Secondary | ICD-10-CM | POA: Diagnosis not present

## 2022-06-06 DIAGNOSIS — M4620 Osteomyelitis of vertebra, site unspecified: Secondary | ICD-10-CM | POA: Diagnosis not present

## 2022-06-06 DIAGNOSIS — T85192A Other mechanical complication of implanted electronic neurostimulator (electrode) of spinal cord, initial encounter: Secondary | ICD-10-CM | POA: Diagnosis not present

## 2022-06-06 DIAGNOSIS — B957 Other staphylococcus as the cause of diseases classified elsewhere: Secondary | ICD-10-CM | POA: Diagnosis not present

## 2022-06-06 DIAGNOSIS — G061 Intraspinal abscess and granuloma: Secondary | ICD-10-CM | POA: Diagnosis not present

## 2022-06-06 LAB — CBC
HCT: 25.4 % — ABNORMAL LOW (ref 39.0–52.0)
Hemoglobin: 8.1 g/dL — ABNORMAL LOW (ref 13.0–17.0)
MCH: 30.6 pg (ref 26.0–34.0)
MCHC: 31.9 g/dL (ref 30.0–36.0)
MCV: 95.8 fL (ref 80.0–100.0)
Platelets: 585 10*3/uL — ABNORMAL HIGH (ref 150–400)
RBC: 2.65 MIL/uL — ABNORMAL LOW (ref 4.22–5.81)
RDW: 15 % (ref 11.5–15.5)
WBC: 15.7 10*3/uL — ABNORMAL HIGH (ref 4.0–10.5)
nRBC: 0 % (ref 0.0–0.2)

## 2022-06-06 LAB — BASIC METABOLIC PANEL
Anion gap: 7 (ref 5–15)
BUN: 28 mg/dL — ABNORMAL HIGH (ref 8–23)
CO2: 30 mmol/L (ref 22–32)
Calcium: 9.7 mg/dL (ref 8.9–10.3)
Chloride: 97 mmol/L — ABNORMAL LOW (ref 98–111)
Creatinine, Ser: 1.21 mg/dL (ref 0.61–1.24)
GFR, Estimated: 59 mL/min — ABNORMAL LOW (ref >60)
Glucose, Bld: 159 mg/dL — ABNORMAL HIGH (ref 70–99)
Potassium: 4.7 mmol/L (ref 3.5–5.1)
Sodium: 134 mmol/L — ABNORMAL LOW (ref 135–145)

## 2022-06-06 LAB — MAGNESIUM: Magnesium: 2.2 mg/dL (ref 1.7–2.4)

## 2022-06-06 MED ORDER — GABAPENTIN 100 MG PO CAPS: 200.0000 mg | ORAL_CAPSULE | Freq: Every day | ORAL | Status: DC | PRN

## 2022-06-06 NOTE — Progress Notes (Signed)
PROGRESS NOTE    Anthony [name]  YQM:[id number] DOB: 03/03/1936 DOA: 05/22/2022  PCP: Ria Bush, MD   Brief Narrative: This 86 yrs old male with medical history significant of atrial flutter,  status post ablation, chronic back pain status post spine stimulator which is malfunctioning, hypertension, hyperlipidemia, hypothyroidism, obesity, obstructive sleep apnea on CPAP comes to the emergency room with complaints of lower back pain radiating bilaterally to both legs,  going down along with increased urinary frequency.  Patient is found to have Staph aureus bacteremia, back pain is concerning for epidural abscess/early discitis.  Infectious diseases consulted, recommended IV antibiotics.  Neurosurgery was consulted,  states No neurosurgical intervention needed, recommended IV steroids and IV antibiotics.  Echo: no evidence of vegetation.  LVEF 52%, grade 1 diastolic dysfunction. CT lumbar spine with no evidence of abscess or discitis or osteomyelitis.  Patient continued to have leukocytosis despite being on Nafcillin, UA with positive LE, started on cefepime.  ID recommended 6 weeks of IV antibiotics if we are not pursuing TEE.    Hospital course complicated by acute GI bleeding with anemia requiring blood transfusion.  EGD was performed and patient found to have peptic ulcer disease.  Ongoing evaluation for severe back pain including removal of nonfunctioning spinal stimulator in order to undergo lumbar MRI.  Neurosurgery following.  Further details of hospital course as below.  Assessment & Plan:   Principal Problem:   Sepsis ([place]) Active Problems:   Coag negative Staphylococcus bacteremia   Gastrointestinal hemorrhage with melena   Failed spinal cord stimulator ([facility])   Weakness   Spinal epidural abscess   Intraspinal abscess and granuloma  Severe sepsis POA:  Staph Capitis bacteremia: He was found to have tachycardia , elevated white count of 21K, lactic acid of 2.0,  increased creatinine to 2.6 from baseline 1.3. Lactic acid normalized. Infectious disease Dr. Delaine Lame consulted.   Repeat blood cultures: No growth  Echo with no evidence of vegetation CT lumbar spine: no evidence of discitis, abscess or osteomyelitis. WBC count remains elevated, improving. UA positive LE no UTI sx's, chest x-ray unremarkable. Cefepime added for persistent leukocytosis. Discussed with ID, resumed of nafcillin  ID recommended 6 weeks of IV antibiotics if we are not pursuing TEE. Currently on Ancef.  Chronic back pain with severe degenerative arthritis: Chronic pain /history of spinal cord pain stimulator-- malfunctioning. Patient follows at pain clinic with Dr. Holley Raring He recently underwent lumbar spinal steroid injection on 05/17/2022.0 CRP 42.2, ESR 10.0 Neurosurgery consulting - Dr. Cari Caraway Pt has increased weakness in LE's left more than right 10/7: Spinal stimulator removed  10/7: Lumbar MRI w/wo contrast shows extensive infection 10/8: Laminectomy L2-3 and L5-S1 with epidural abscess evacuation Started scheduled Tylenol 1000 mg TID Continue PRN PO and IV pain meds  Trial of low dose Robaxin was too sedating for pt - d/c'd Continue gabapentin at bedtime.  Added daily PRN lower dose of 200 mg. CT lumbar spine no evidence of discitis, abscess or osteomyelitis. Follow abscess cultures from 10/9   AKI on CKD stage IIIb:  Baseline creatinine 1.3 Presented  with creatinine of 2.62--2.18 >2.25>1.42 > ...1.02>1.21>1.32> 1.21 Now off IV fluids Avoid nephrotoxic agents AKI resolved, it did recur briefly but mild, after being NPO for surgery yesterday and sedated with minimal PO intake. Now improving again and at baseline. --encourage PO hydration.    Hypokalemia:  Replaced on 10/4 for K3.3.   Monitor BMP and replace as needed. K 3.4 today, will replace.   Leukocytosis:  improving Likely reactive due to steroids versus to infection. --WBC remains elevated,  now slowly improving Patient remained on daptomycin for staph bacteremia. Antibiotic changed to nafcillin. UA+ LE Added cefepime for gram-negative coverage. Now on Ancef   Elevated troponin: Likely demand ischemia in the setting of sepsis. Patient denies any chest pain, dizziness, palpitations. Hx of CAD with stent placed in LAD few years ago Hx. of atrial flutter status post ablation in the past.  Patient follows with [facility] cardiology --Aspirin and Plavix is on hold.   --Continue statin   Reactive thrombocytosis Monitor CBC  Hypothyroidism Continue Synthroid.   Hyperlipidemia  Continue Zetia.  Hypokalemia: Replaced.  Continue to monitor   Upper GI bleed: Acute blood loss anemia: Patient had 2 dark black stools. Hb trended down 10.7>9.7>7.7>8.5>7.0.Marland KitchenMarland Kitchen  Now stable Hbg 8.3 >> 8.1 >>8.3>>8.1>>7.4>>8.1>>8.2 10/6 --nursing reported recurrent dark stools, hemoglobin and stable 10/8 - Hbg 7.4, down from 8.1 after stimulator removal in OR yesterday.  Repeat in afternoon was 8.1 again. 10/9-10 - Hbg stable 8.2>8.1 GI consulted earlier this admission.   Patient underwent EGD on 10/3 which showed gastric and duodenal ulcers all nonbleeding with clean ulcer base (Forrest class III) RBC tagged scan negative for acute bleeding. Transfused 1 unit PRBC. Hold Plavix and aspirin.    DVT prophylaxis:SCDs Code Status: DNR Family Communication: Patient's son updated by phone this afternoon 10/10.  Disposition Plan:   Status is: Inpatient Remains inpatient appropriate because:  On IV antibiotics for bacteremia with ongoing evaluation.   Epidural abscess cultures pending from OR on 10/9 for final antibiotic plan. Anticipate medically ready for d/c pending clearance by neurosurgery and ID. Needs SNF/rehab.    Consultants:  Neurosurgery Infectious disease  Procedures: None  Antimicrobials: Daptomycin, > nafcillin > Cefepime > Ancef  Subjective: Patient was awake resting in bed,  appeared comfortable.  He reports pain pain is okay as long as he is resting and laying still, has severe pain with any attempted movement or mobility.  No other acute complaints today.  No acute events reported overnight   Objective: Vitals:   06/05/22 2020 06/06/22 0503 06/06/22 0811 06/06/22 0900  BP: 126/83 116/78 115/87   Pulse: 93 85 89   Resp: '20 20 16   ' Temp: 97.6 F (36.4 C) 98.2 F (36.8 C) (!) 97.5 F (36.4 C)   TempSrc:   Oral   SpO2: 97% 96% (!) 88% 92%  Weight:      Height:        Intake/Output Summary (Last 24 hours) at 06/06/2022 1426 Last data filed at 06/06/2022 1300 Gross per 24 hour  Intake 100 ml  Output 1005 ml  Net -905 ml   Filed Weights   05/30/22 1028 05/31/22 1334  Weight: 122.5 kg 109.1 kg    Examination:  General exam: Awake resting in bed, conversational, no acute distress, does not seem confused this morning HEENT: Moist mucous membranes, patches of erythema and skin flaking on face and forehead improved today, hearing grossly normal  Respiratory system: CTAB, no wheezes, rales or rhonchi, normal respiratory effort. Cardiovascular system: normal S1/S2, RRR, no peripheral edema.   Gastrointestinal system: soft, NT, ND Central nervous system: Alert and oriented x4 grossly nonfocal exam, limited by somnolence Skin: dry, intact, normal temperature Psychiatry: normal mood, congruent affect, judgement and insight appear normal     Data Reviewed:   Notable labs --- sodium 134, chloride 97, glucose 159, BUN 28, white count 15.7 from 15.2, hemoglobin stable 8.1, platelets improved 585  Microbiology ---  Blood cultures from 9/25 grew Staph capitis. Repeat blood cultures from 9/29 - no growth, finalized 10/4.Marland Kitchen Urine culture from 10/1 - no growth.  Epidural abscess cultures from 10/9 pending -no growth at less than 12 hours    Procedures: Echocardiogram and CT myelogram -- no evidence of vegetation, discitis, osteomyelitis or abscess.     EGD on 10/3 --- notable for multiple nonbleeding clean-based ulcers, one in the stomach and 2 in the duodenum.  10/7 - spinal stimulator removed   MRI lumbar spine later on 10/7 with extensive infection --discitis/osteomyelitis at L5-S1, suspected septic arthritis at bilateral facets at L4-5, extensive epidural phlegmon/abscess extending from L1-2 down through the sacrum with moderate to severe diffuse spinal stenosis, soft tissue edema and enhancement of the lower paraspinal soft tissues (cellulitis/myositis), 2.4 cm paraspinal abscess along the left anterolateral vertebral column from L5-S1.  10/9 -underwent laminectomy of L2-3, and L5-S1 with evacuation of epidural abscess    LOS: 15 days    Time spent: 35 minutes    Ezekiel Slocumb, DO [facility]   If 7PM-7AM, please contact night-coverage

## 2022-06-06 NOTE — Progress Notes (Signed)
Physical Therapy Re-EvaluationTreatment Patient Details Name: Anthony [name] MRN: [id number] DOB: [birth year] Today's Date: 06/06/2022   History of Present Illness This 86 yrs old male with medical history significant of atrial flutter, status post ablation, chronic back pain status post stent spine stimulator which is malfunctioning, hypertension, hyperlipidemia, hypothyroidism, obesity, obstructive sleep apnea on CPAP comes to the emergency room with complaints of lower back pain radiating bilaterally to both legs,  going down along with increased urinary frequency. Patient is found to have Staph aureus bacteremia, back pain is concerning for epidural abscess/early discitis.  Infectious diseases consulted recommended IV antibiotics. Neurosurgery is consulted states no neurosurgical intervention needed, recommended IV steroids and IV antibiotics. If additional work-up is needed could consider MRI versus CT myelogram.  S/P spinal cord stimulator removal (06/03/22) with continue upon transfer orders received.  Additional imaging completed, and patient now pending surgical evacuation of extensive epidural abscess next date per notes.  Pt is s/p lumbar laminectomy L2-3, L5-S1.    PT Comments    Pt received in Semi-Fowler's position and agreeable to therapy.  Pt unwilling to initially participate in log rolling technique, but eventually listened to therapist suggestion.  OT assisting with re-evaluation for safety of pt.  Pt assisted to EOB then participated in standing however needed +2 modA from elevated surface to come upright into standing position.  Pt then transferred to recliner where he was left with all needs met and call bell within reach.  Current discharge plans to SNF remain appropriate at this time.  Pt will continue to benefit from skilled therapy in order to address deficits listed below.    Recommendations for follow up therapy are one component of a multi-disciplinary discharge planning  process, led by the attending physician.  Recommendations may be updated based on patient status, additional functional criteria and insurance authorization.  Follow Up Recommendations  Skilled nursing-short term rehab (<3 hours/day) Can patient physically be transported by private vehicle: No   Assistance Recommended at Discharge Frequent or constant Supervision/Assistance  Patient can return home with the following Two people to help with walking and/or transfers;A lot of help with bathing/dressing/bathroom;Assistance with cooking/housework;Direct supervision/assist for medications management;Direct supervision/assist for financial management;Assist for transportation;Help with stairs or ramp for entrance   Equipment Recommendations       Recommendations for Other Services       Precautions / Restrictions Precautions Precautions: Fall Restrictions Weight Bearing Restrictions: No     Mobility  Bed Mobility Overal bed mobility: Needs Assistance Bed Mobility: Supine to Sit, Sit to Supine Rolling: Mod assist Sidelying to sit: Max assist, +2 for physical assistance       General bed mobility comments: Pt performed log roll technique with therapist suggestion.  Pt needs extensive support and assistance to perform.    Transfers Overall transfer level: Needs assistance Equipment used: Rolling walker (2 wheels) Transfers: Sit to/from Stand Sit to Stand: Mod assist, +2 physical assistance, From elevated surface                Ambulation/Gait Ambulation/Gait assistance: +2 physical assistance, +2 safety/equipment, Min assist, Mod assist Gait Distance (Feet): 3 Feet Assistive device: Rolling walker (2 wheels) Gait Pattern/deviations: Step-to pattern, Trunk flexed Gait velocity: decreased     General Gait Details: does not want to listen to instructions at times, but is able to perform.   Stairs             Wheelchair Mobility    Modified Rankin (Stroke  Patients Only)  Balance Overall balance assessment: Needs assistance Sitting-balance support: No upper extremity supported, Feet supported Sitting balance-Leahy Scale: Fair     Standing balance support: Bilateral upper extremity supported Standing balance-Leahy Scale: Poor Standing balance comment: Pt has poor safety awareness and wants to do things his way.                            Cognition Arousal/Alertness: Awake/alert Behavior During Therapy: WFL for tasks assessed/performed Overall Cognitive Status: Within Functional Limits for tasks assessed                                 General Comments: orientation tends to shift throughout the sessions.        Exercises      General Comments        Pertinent Vitals/Pain Pain Assessment Pain Assessment: Faces Faces Pain Scale: Hurts whole lot Pain Location: back pain Pain Descriptors / Indicators: Grimacing, Guarding, Discomfort, Sharp, Shooting, Moaning Pain Intervention(s): Limited activity within patient's tolerance, Monitored during session, Repositioned    Home Living Family/patient expects to be discharged to:: Private residence Living Arrangements: Alone Available Help at Discharge: Friend(s);Available PRN/intermittently;Family Type of Home: House Home Access: Level entry       Home Layout: One level Home Equipment: Conservation officer, nature (2 wheels);Air cabin crew (4 wheels)      Prior Function            PT Goals (current goals can now be found in the care plan section) Acute Rehab PT Goals Patient Stated Goal: none stated PT Goal Formulation: With patient Time For Goal Achievement: 06/18/22 Potential to Achieve Goals: Fair Progress towards PT goals: Progressing toward goals    Frequency    Min 2X/week      PT Plan Current plan remains appropriate    Co-evaluation PT/OT/SLP Co-Evaluation/Treatment: Yes Reason for Co-Treatment: Complexity of the patient's  impairments (multi-system involvement);For patient/therapist safety PT goals addressed during session: Mobility/safety with mobility;Balance;Proper use of DME;Strengthening/ROM OT goals addressed during session: ADL's and self-care;Proper use of Adaptive equipment and DME      AM-PAC PT "6 Clicks" Mobility   Outcome Measure  Help needed turning from your back to your side while in a flat bed without using bedrails?: A Lot Help needed moving from lying on your back to sitting on the side of a flat bed without using bedrails?: A Lot Help needed moving to and from a bed to a chair (including a wheelchair)?: A Lot Help needed standing up from a chair using your arms (e.g., wheelchair or bedside chair)?: A Lot Help needed to walk in hospital room?: Total Help needed climbing 3-5 steps with a railing? : Total 6 Click Score: 10    End of Session   Activity Tolerance: Patient limited by pain Patient left: in bed;with call bell/phone within reach;with bed alarm set Nurse Communication: Mobility status PT Visit Diagnosis: Unsteadiness on feet (R26.81);Other abnormalities of gait and mobility (R26.89);Muscle weakness (generalized) (M62.81);Repeated falls (R29.6);Pain;Difficulty in walking, not elsewhere classified (R26.2)     Time: 2876-8115 PT Time Calculation (min) (ACUTE ONLY): 20 min  Charges:                        Gwenlyn Saran, PT, DPT 06/06/22, 5:16 PM

## 2022-06-06 NOTE — Plan of Care (Signed)

## 2022-06-06 NOTE — Evaluation (Signed)
Occupational Therapy Re-Evaluation Patient Details Name: Anthony [name] MRN: [id number] DOB: 1936-01-29 Today's Date: 06/06/2022   History of Present Illness This 86 yrs old male with medical history significant of atrial flutter, status post ablation, chronic back pain status post stent spine stimulator which is malfunctioning, hypertension, hyperlipidemia, hypothyroidism, obesity, obstructive sleep apnea on CPAP comes to the emergency room with complaints of lower back pain radiating bilaterally to both legs,  going down along with increased urinary frequency. Patient is found to have Staph aureus bacteremia, back pain is concerning for epidural abscess/early discitis.  Infectious diseases consulted recommended IV antibiotics. Neurosurgery is consulted states no neurosurgical intervention needed, recommended IV steroids and IV antibiotics. If additional work-up is needed could consider MRI versus CT myelogram.  S/P spinal cord stimulator removal (06/03/22) with continue upon transfer orders received.  Additional imaging completed, and patient now pending surgical evacuation of extensive epidural abscess next date per notes.  Pt is s/p lumbar laminectomy L2-3, L5-S1.   Clinical Impression   Patient seen for re-evaluation. Chart reviewed to date, pt received in bed. Pt agreeable to OT/PT co-treatment to maximize safety and participation. +2 required for mobility this date. Pt instructed in log roll technique for bed mobility 2/2 back pain. He required Mod A +2 to stand from elevated bed surface and then took steps toward recliner. Anticipate Max A for LB dressing and toileting tasks. Pt required VC for safety awareness throughout session. Pt left in recliner with all needs in reach. Reviewed goals and updated based on progress. Pt continues to benefit from skilled OT to maximize safety and independence.      Recommendations for follow up therapy are one component of a multi-disciplinary discharge planning  process, led by the attending physician.  Recommendations may be updated based on patient status, additional functional criteria and insurance authorization.   Follow Up Recommendations  Skilled nursing-short term rehab (<3 hours/day)    Assistance Recommended at Discharge Frequent or constant Supervision/Assistance  Patient can return home with the following A lot of help with bathing/dressing/bathroom;Assistance with cooking/housework;Assist for transportation;Help with stairs or ramp for entrance;Two people to help with walking and/or transfers    Functional Status Assessment  Patient has had a recent decline in their functional status and demonstrates the ability to make significant improvements in function in a reasonable and predictable amount of time.  Equipment Recommendations  Other (comment) (defer to next venue of care)    Recommendations for Other Services       Precautions / Restrictions Precautions Precautions: Fall Restrictions Weight Bearing Restrictions: No      Mobility Bed Mobility Overal bed mobility: Needs Assistance Bed Mobility: Supine to Sit, Sit to Supine Rolling: Mod assist Sidelying to sit: Max assist, +2 for physical assistance       General bed mobility comments: instructed in log roll technique    Transfers Overall transfer level: Needs assistance Equipment used: Rolling walker (2 wheels) Transfers: Sit to/from Stand Sit to Stand: Mod assist, +2 physical assistance, From elevated surface           General transfer comment: 2x attempts STS unsuccessful in clearing bottom off of bed surface, 3rd trial STS pt required Mod A +2 to come to fully upright standing position. Required elevated surface      Balance Overall balance assessment: Needs assistance Sitting-balance support: No upper extremity supported, Feet supported Sitting balance-Leahy Scale: Fair     Standing balance support: Bilateral upper extremity supported Standing  balance-Leahy Scale: Poor  Standing balance comment: heavy BUE support on RW                           ADL either performed or assessed with clinical judgement   ADL Overall ADL's : Needs assistance/impaired                     Lower Body Dressing: Maximal assistance;Sitting/lateral leans Lower Body Dressing Details (indicate cue type and reason): socks Toilet Transfer: Moderate assistance;+2 for physical assistance;Rolling walker (2 wheels) Toilet Transfer Details (indicate cue type and reason): simulated with STS from elevated bed surface Toileting- Clothing Manipulation and Hygiene: Maximal assistance;Sit to/from stand Toileting - Clothing Manipulation Details (indicate cue type and reason): anticipate for clothing management and posterior hygiene     Functional mobility during ADLs: Moderate assistance;+2 for physical assistance;Rolling walker (2 wheels);Minimal assistance;+2 for safety/equipment       Vision Patient Visual Report: No change from baseline       Perception     Praxis      Pertinent Vitals/Pain Pain Assessment Pain Assessment: Faces Faces Pain Scale: Hurts whole lot Pain Location: back pain Pain Descriptors / Indicators: Grimacing, Guarding, Discomfort, Sharp, Shooting, Moaning Pain Intervention(s): Limited activity within patient's tolerance, Monitored during session, Repositioned     Hand Dominance     Extremity/Trunk Assessment Upper Extremity Assessment Upper Extremity Assessment: Generalized weakness   Lower Extremity Assessment Lower Extremity Assessment: Generalized weakness       Communication Communication Communication: No difficulties   Cognition Arousal/Alertness: Awake/alert Behavior During Therapy: WFL for tasks assessed/performed Overall Cognitive Status: Within Functional Limits for tasks assessed                                 General Comments: VC for safety awareness throughout session,  wants to do things a certain way/his way     General Comments  pt found with O2 at bedside, SpO2 87% on RA, pt deferring putting O2 back on, engaged in pursed lip breathing and improved to 97% on RA    Exercises Other Exercises Other Exercises: OT provided education re: role of OT, OT POC, post acute recs, sitting up for all meals, EOB/OOB mobility with assistance, home/fall safety.     Shoulder Instructions      Home Living Family/patient expects to be discharged to:: Private residence Living Arrangements: Alone Available Help at Discharge: Friend(s);Available PRN/intermittently;Family Type of Home: House Home Access: Level entry     Home Layout: One level     Bathroom Shower/Tub: Sponge bathes at baseline   Bathroom Toilet: Standard     Home Equipment: Conservation officer, nature (2 wheels);Shower seat;Rollator (4 wheels)          Prior Functioning/Environment Prior Level of Function : Needs assist             Mobility Comments: Uses RW. Reports 2 or 3 falls in past year. Girl friend helps him with walking at household level. ADLs Comments: Sponge bathes at baseline. Family and girlfriend  does shopping, cooking, cleaning and assists pt with dressing, bathing. Set up help with  meds.    Pt can drive but reports he rarely leaves his house.        OT Problem List: Decreased strength;Decreased activity tolerance;Decreased range of motion;Decreased knowledge of use of DME or AE;Impaired balance (sitting and/or standing);Pain;Decreased safety awareness;Obesity  OT Treatment/Interventions: Self-care/ADL training;Patient/family education;Therapeutic exercise;Balance training;Energy conservation;Therapeutic activities;DME and/or AE instruction    OT Goals(Current goals can be found in the care plan section) Acute Rehab OT Goals Patient Stated Goal: reduce pain OT Goal Formulation: With patient Time For Goal Achievement: 06/20/22 Potential to Achieve Goals: Good ADL Goals Pt  Will Perform Lower Body Dressing: sitting/lateral leans;sit to/from stand;with adaptive equipment;with min assist Pt Will Transfer to Toilet: ambulating;bedside commode;with min assist (using LRAD)  OT Frequency: Min 2X/week    Co-evaluation PT/OT/SLP Co-Evaluation/Treatment: Yes Reason for Co-Treatment: Complexity of the patient's impairments (multi-system involvement);For patient/therapist safety PT goals addressed during session: Mobility/safety with mobility;Balance;Proper use of DME;Strengthening/ROM OT goals addressed during session: ADL's and self-care;Proper use of Adaptive equipment and DME      AM-PAC OT "6 Clicks" Daily Activity     Outcome Measure Help from another person eating meals?: None Help from another person taking care of personal grooming?: A Little Help from another person toileting, which includes using toliet, bedpan, or urinal?: A Lot Help from another person bathing (including washing, rinsing, drying)?: A Lot Help from another person to put on and taking off regular upper body clothing?: A Lot Help from another person to put on and taking off regular lower body clothing?: A Lot 6 Click Score: 15   End of Session Equipment Utilized During Treatment: Rolling walker (2 wheels);Gait belt Nurse Communication: Mobility status  Activity Tolerance: Patient limited by pain;Patient tolerated treatment well Patient left: in chair;with call bell/phone within reach;with chair alarm set  OT Visit Diagnosis: Pain;Muscle weakness (generalized) (M62.81);Unsteadiness on feet (R26.81) Pain - part of body:  (back)                Time: 7654-6503 OT Time Calculation (min): 21 min Charges:  OT General Charges $OT Visit: 1 Visit OT Evaluation $OT Re-eval: 1 Re-eval  [facility] MS, OTR/L ascom [phone]  06/06/22, 5:32 PM

## 2022-06-06 NOTE — Progress Notes (Signed)
   Date of Admission:  05/22/2022      ID: Anthony [name] is a 86 y.o. male Principal Problem:   Sepsis ([place]) Active Problems:   Coag negative Staphylococcus bacteremia   Gastrointestinal hemorrhage with melena   Failed spinal cord stimulator ([facility])   Weakness   Spinal epidural abscess   Intraspinal abscess and granuloma    Subjective: Pt sitting in chair Feeling better than before Pain back better Had laminectomy yesterday    Medications:   acetaminophen  1,000 mg Oral Q8H   amLODipine  5 mg Oral Daily   Chlorhexidine Gluconate Cloth  6 each Topical Q0600   ezetimibe  10 mg Oral Daily   feeding supplement  237 mL Oral TID BM   finasteride  5 mg Oral Daily   furosemide  40 mg Oral Daily   gabapentin  600 mg Oral QHS   ketoconazole   Topical QHS   ketoconazole   Topical Once per day on Mon Thu   levothyroxine  175 mcg Oral Q0600   linaclotide  290 mcg Oral QAC breakfast   pantoprazole (PROTONIX) IV  40 mg Intravenous Q12H   tamsulosin  0.4 mg Oral Daily    Objective: Vital signs in last 24 hours: Temp:  [97 F (36.1 C)-98.4 F (36.9 C)] 97.5 F (36.4 C) (10/10 0811) Pulse Rate:  [85-106] 89 (10/10 0811) Resp:  [11-22] 16 (10/10 0811) BP: (115-149)/(73-94) 115/87 (10/10 0811) SpO2:  [88 %-98 %] 92 % (10/10 0900)  PHYSICAL EXAM:  General: awake, alert, no distress  Lungs: b/l air entry- decreased in the bases Heart: Regular rate and rhythm, no murmur, rub or gallop. Abdomen: Soft, non-tender,not distended. Bowel sounds normal. No masses Extremities: atraumatic, no cyanosis. No edema. No clubbing Skin: No rashes or lesions. Or bruising Lymph: Cervical, supraclavicular normal. Neurologic: left lower extremity weakness  Lab Results Recent Labs    06/05/22 0452 06/06/22 0432  WBC 15.2* 15.7*  HGB 8.2* 8.1*  HCT 25.5* 25.4*  NA 135 134*  K 4.4 4.7  CL 97* 97*  CO2 31 30  BUN 26* 28*  CREATININE 1.29* 1.21   Liver Panel No results for input(s):  "PROT", "ALBUMIN", "AST", "ALT", "ALKPHOS", "BILITOT", "BILIDIR", "IBILI" in the last 72 hours.      Assessment/Plan: Staph  capitis bacteremia Epidural abscessLow back pain with radiation down both legs Left leg more weak than right Has underlying degenerative spine disease and after a recent LESI the pain is worse Leucocytosis Osteomyelitiis/discitis/ L5-S1 epidural abscess/phlegmon L1-2- sacrum B/l facet septic arthritis L4-L5-  S/p laminectomy of L2-L3 and L5-S1 on 06/05/22 Spinal stimulator removal on 06/03/22 Continue cefazolin for 6-8 weeks    AKI on CKD- combination of infection, NSAID use, dehydration and BPH causing retention- resolved post void bladder scan had 900cc of urine and staright cath was 1600cc Has foley cath    GI bleed- underwent endoscopy- 2 non bleeding duodenal ulcer, and 1 non bleeding gastric ulcer seen On PPI    Hypothyrodism on synthorid   CAD s/p stent B/l TKA   H/o endovascular stent for aneurysm    Discussed with patient and his son at bed side

## 2022-06-06 NOTE — Progress Notes (Signed)
    Attending Progress Note  History: Anthony [name] is here for severe back pain, lower extremity weakness, sepsis.  POD1: Doing well from epidural evacuation  06/02/22: After lack of improvement with antibiotics, his treating physicians feel it is most appropriate to move forward with removal of his stimulator.  Physical Exam: Vitals:   06/06/22 0503 06/06/22 0811  BP: 116/78 115/87  Pulse: 85 89  Resp: 20 16  Temp: 98.2 F (36.8 C) (!) 97.5 F (36.4 C)  SpO2: 96% (!) 88%    AA Ox3 CNI  Strength:5/5 throughout RLE  L IP 4-/5, KE and HS 4+, DF/PF 4+  Drains 5/15  Cx pending  Data:  Recent Labs  Lab 06/04/22 0630 06/05/22 0452 06/06/22 0432  NA 131* 135 134*  K 4.5 4.4 4.7  CL 95* 97* 97*  CO2 '30 31 30  '$ BUN 26* 26* 28*  CREATININE 1.32* 1.29* 1.21  GLUCOSE 146* 118* 159*  CALCIUM 10.0 9.8 9.7   Recent Labs  Lab 05/30/22 0830  AST 22  ALT 19  ALKPHOS 50     Recent Labs  Lab 06/04/22 0630 06/04/22 1750 06/05/22 0452 06/06/22 0432  WBC 14.5*  --  15.2* 15.7*  HGB 7.4*   < > 8.2* 8.1*  HCT 23.6*   < > 25.5* 25.4*  PLT 580*  --  635* 585*   < > = values in this interval not displayed.   No results for input(s): "APTT", "INR" in the last 168 hours.      MRI L spine 06/03/22 IMPRESSION: 1. Findings concerning for osteomyelitis discitis at L5-S1, with additional suspected changes of bilateral septic arthritis about the bilateral L4-5 facets. 2. Associated extensive epidural phlegmon/abscess extending from L1-2 through the sacrum with resultant moderate to severe diffuse spinal stenosis. 3. Diffuse soft tissue edema and enhancement throughout the lower paraspinous soft tissues, consistent with regional cellulitis/myositis. Probable 2.4 cm paraspinous abscess along the left anterolateral aspect of the lower vertebral column, emanating from the L5-S1 interspace as above. 4. Underlying advanced multilevel degenerative spondylosis as detailed  above.   Critical Value/emergent results were called by telephone at the time of interpretation on 06/03/2022 at 7:37 pm to provider Sharion Settler, who verbally acknowledged these results.     Electronically Signed   By: Jeannine Boga M.D.   On: 06/03/2022 19:44  Assessment/Plan:  Anthony [name] is here with sepsis, severe back pain, weakness, and difficulty walking. He has had his spinal cord stimulator removed. He underwent laminectomy for epidural abscess evacuation on 06/05/22.  - Ok to work with PTOT - Monitor drain output - Abx per ID    Meade Maw MD, [facility] Department of Neurosurgery

## 2022-06-06 NOTE — Care Management Important Message (Signed)
Important Message  Patient Details  Name: LONN IM MRN: [id number] Date of Birth: Jul 25, 1936   Medicare Important Message Given:  Yes     Juliann Pulse A Nykeem Citro 06/06/2022, 11:40 AM

## 2022-06-07 ENCOUNTER — Inpatient Hospital Stay: Payer: Self-pay

## 2022-06-07 DIAGNOSIS — R6520 Severe sepsis without septic shock: Secondary | ICD-10-CM | POA: Diagnosis not present

## 2022-06-07 DIAGNOSIS — N179 Acute kidney failure, unspecified: Secondary | ICD-10-CM | POA: Diagnosis not present

## 2022-06-07 DIAGNOSIS — A419 Sepsis, unspecified organism: Secondary | ICD-10-CM | POA: Diagnosis not present

## 2022-06-07 LAB — CBC
HCT: 25.5 % — ABNORMAL LOW (ref 39.0–52.0)
Hemoglobin: 8 g/dL — ABNORMAL LOW (ref 13.0–17.0)
MCH: 30.4 pg (ref 26.0–34.0)
MCHC: 31.4 g/dL (ref 30.0–36.0)
MCV: 97 fL (ref 80.0–100.0)
Platelets: 575 10*3/uL — ABNORMAL HIGH (ref 150–400)
RBC: 2.63 MIL/uL — ABNORMAL LOW (ref 4.22–5.81)
RDW: 14.9 % (ref 11.5–15.5)
WBC: 14.5 10*3/uL — ABNORMAL HIGH (ref 4.0–10.5)
nRBC: 0 % (ref 0.0–0.2)

## 2022-06-07 LAB — BASIC METABOLIC PANEL
Anion gap: 8 (ref 5–15)
BUN: 29 mg/dL — ABNORMAL HIGH (ref 8–23)
CO2: 31 mmol/L (ref 22–32)
Calcium: 10 mg/dL (ref 8.9–10.3)
Chloride: 96 mmol/L — ABNORMAL LOW (ref 98–111)
Creatinine, Ser: 1.28 mg/dL — ABNORMAL HIGH (ref 0.61–1.24)
GFR, Estimated: 55 mL/min — ABNORMAL LOW (ref >60)
Glucose, Bld: 119 mg/dL — ABNORMAL HIGH (ref 70–99)
Potassium: 4.3 mmol/L (ref 3.5–5.1)
Sodium: 135 mmol/L (ref 135–145)

## 2022-06-07 LAB — SURGICAL PATHOLOGY

## 2022-06-07 MED ORDER — ASPIRIN 81 MG PO TBEC
81.0000 mg | DELAYED_RELEASE_TABLET | Freq: Every day | ORAL | Status: DC
  Administered 2022-06-08: 81 mg via ORAL
  Filled 2022-06-07: qty 1

## 2022-06-07 NOTE — Treatment Plan (Signed)
Diagnosis: Staph capitis bacteremia Lumbar Epidural abscess Facet arthritis Baseline Creatinine 1.24    Allergies  Allergen Reactions   Questran [Cholestyramine]     Patient not aware of an allergy to this medicine.   Atorvastatin     Muscle pain   Gemfibrozil Other (See Comments)    Muscle pain.   Metformin And Related     Dizziness   Rosuvastatin     Muscle pain    OPAT Orders Discharge antibiotics: Cefazolin 2 grams IV every 8 hours  Duration: 6 weeks End Date: 07/16/22  [facility] Care Per Protocol:  Labs weekly every Monday while on IV antibiotics: X__ CBC with differential  _X_ CMP _X_ CRP _X_ ESR   _X_ Please pull PIC at completion of IV antibiotics _ Fax weekly lab results  promptly to [phone]  Clinic Follow Up Appt: 06/29/22 at 9 am   Call [phone] with any questions

## 2022-06-07 NOTE — Progress Notes (Signed)
Has a bed offer at [facility]. Sent Tammy in admissions a notification of upcoming discharge.

## 2022-06-07 NOTE — Progress Notes (Signed)
Nutrition Follow-up  DOCUMENTATION CODES:   Obesity unspecified  INTERVENTION:   -Continue with regular diet -Continue MVI with minerals daily -Continue Ensure Enlive po TID, each supplement provides 350 kcal and 20 grams of protein -Magic cup TID with meals, each supplement provides 290 kcal and 9 grams of protein   NUTRITION DIAGNOSIS:   Inadequate oral intake related to poor appetite as evidenced by meal completion < 25%.  Progressing   GOAL:   Patient will meet greater than or equal to 90% of their needs  Progressing   MONITOR:   PO intake, Supplement acceptance  REASON FOR ASSESSMENT:   Rounds    ASSESSMENT:   Pt with medical history significant of atrial flutter is status post ablation, chronic back pain status post stent spine stimulator which is malfunctioning, hypertension, hyperlipidemia, hypothyroidism, obesity, obstructive sleep apnea on CPAP comes with complaints of back pain lower radiating bilaterally to both legs going down along with increased urinary frequency.  10/7- s/p removal of spinal cord stimulator 10/9- s/p L2-3 and L5-S1 lumbar laminectomy for evacuation of epidural abscess  Reviewed I/O's: -1.5 L x 24 hours and -25.1 L since 05/24/22  UOP: 3.2 L x 24 hours  Drains: 65 ml x 24 hours   Pt sleep[ing soundly at time of visit. He did not respond to voice.   Noted meal completions 10-75%. Pt is drinking Ensure supplements.   Per MD notes, plan for SNF placement once medically stable (needs clearance from neurosurgery and ID for final antibiotic plan).   Medications reviewed and include lasix.   Labs reviewed: CBGS: 157.   Diet Order:   Diet Order             Diet regular Room service appropriate? Yes; Fluid consistency: Thin  Diet effective now                   EDUCATION NEEDS:   Education needs have been addressed  Skin:  Skin Assessment: Skin Integrity Issues: Skin Integrity Issues:: Incisions Incisions: closed back,  mid lumbar  Last BM:  06/04/22  Height:   Ht Readings from Last 1 Encounters:  05/30/22 6' (1.829 m)    Weight:   Wt Readings from Last 1 Encounters:  05/31/22 109.1 kg    Ideal Body Weight:  80.9 kg  BMI:  Body mass index is 32.62 kg/m.  Estimated Nutritional Needs:   Kcal:  2200-2400  Protein:  105-120 grams  Fluid:  > 2 L    Loistine Chance, RD, LDN, [place] Registered Dietitian II Certified Diabetes Care and Education Specialist Please refer to [facility] for RD and/or RD on-call/weekend/after hours pager

## 2022-06-07 NOTE — Progress Notes (Signed)
    Attending Progress Note  History: Anthony [name] is here for severe back pain, lower extremity weakness, sepsis.  POD2: Doing well.  POD1: Doing well from epidural evacuation  06/02/22: After lack of improvement with antibiotics, his treating physicians feel it is most appropriate to move forward with removal of his stimulator.  Physical Exam: Vitals:   06/07/22 0826 06/07/22 1522  BP: 134/88   Pulse: (!) 58   Resp: 16   Temp: 98.5 F (36.9 C)   SpO2: 100% 94%    AA Ox3 CNI  Strength:MAEW  Drains out  Cx pending  Data:  Recent Labs  Lab 06/05/22 0452 06/06/22 0432 06/07/22 0526  NA 135 134* 135  K 4.4 4.7 4.3  CL 97* 97* 96*  CO2 '31 30 31  '$ BUN 26* 28* 29*  CREATININE 1.29* 1.21 1.28*  GLUCOSE 118* 159* 119*  CALCIUM 9.8 9.7 10.0   No results for input(s): "AST", "ALT", "ALKPHOS" in the last 168 hours.  Invalid input(s): "TBILI"    Recent Labs  Lab 06/05/22 0452 06/06/22 0432 06/07/22 0526  WBC 15.2* 15.7* 14.5*  HGB 8.2* 8.1* 8.0*  HCT 25.5* 25.4* 25.5*  PLT 635* 585* 575*   No results for input(s): "APTT", "INR" in the last 168 hours.      MRI L spine 06/03/22 IMPRESSION: 1. Findings concerning for osteomyelitis discitis at L5-S1, with additional suspected changes of bilateral septic arthritis about the bilateral L4-5 facets. 2. Associated extensive epidural phlegmon/abscess extending from L1-2 through the sacrum with resultant moderate to severe diffuse spinal stenosis. 3. Diffuse soft tissue edema and enhancement throughout the lower paraspinous soft tissues, consistent with regional cellulitis/myositis. Probable 2.4 cm paraspinous abscess along the left anterolateral aspect of the lower vertebral column, emanating from the L5-S1 interspace as above. 4. Underlying advanced multilevel degenerative spondylosis as detailed above.   Critical Value/emergent results were called by telephone at the time of interpretation on 06/03/2022 at  7:37 pm to provider Sharion Settler, who verbally acknowledged these results.     Electronically Signed   By: Jeannine Boga M.D.   On: 06/03/2022 19:44  Assessment/Plan:  Anthony [name] is here with sepsis, severe back pain, weakness, and difficulty walking. He has had his spinal cord stimulator removed. He underwent laminectomy for epidural abscess evacuation on 06/05/22.  - Ok to work with [place] per ID    Meade Maw MD, [facility] Department of Neurosurgery

## 2022-06-07 NOTE — Progress Notes (Addendum)
PROGRESS NOTE    Anthony [name]  YTK:[id number] DOB: May 27, 1936 DOA: 05/22/2022  PCP: Ria Bush, MD   Brief Narrative: This 86 yrs old male with medical history significant of atrial flutter,  status post ablation, chronic back pain status post spine stimulator which is malfunctioning, hypertension, hyperlipidemia, hypothyroidism, obesity, obstructive sleep apnea on CPAP comes to the emergency room with complaints of lower back pain radiating bilaterally to both legs,  going down along with increased urinary frequency.  Patient is found to have Staph aureus bacteremia, back pain is concerning for epidural abscess/early discitis.  Infectious diseases consulted, recommended IV antibiotics.  Neurosurgery was consulted,  states No neurosurgical intervention needed, recommended IV steroids and IV antibiotics.  Echo: no evidence of vegetation.  LVEF 75%, grade 1 diastolic dysfunction. CT lumbar spine with no evidence of abscess or discitis or osteomyelitis.  Patient continued to have leukocytosis despite being on Nafcillin, UA with positive LE, started on cefepime.  ID recommended 6 weeks of IV antibiotics if we are not pursuing TEE.    Hospital course complicated by acute GI bleeding with anemia requiring blood transfusion.  EGD was performed and patient found to have peptic ulcer disease.  Ongoing evaluation for severe back pain including removal of nonfunctioning spinal stimulator in order to undergo lumbar MRI.  Neurosurgery following.  Further details of hospital course as below.  Assessment & Plan:   Principal Problem:   Sepsis ([place]) Active Problems:   Coag negative Staphylococcus bacteremia   Gastrointestinal hemorrhage with melena   Failed spinal cord stimulator (HCC)   Weakness   Abscess in epidural space of lumbar spine   Intraspinal abscess and granuloma   Discitis of lumbosacral region   Vertebral osteomyelitis (HCC)  Severe sepsis POA:  Staph Capitis bacteremia: He was  found to have tachycardia , elevated white count of 21K, lactic acid of 2.0, increased creatinine to 2.6 from baseline 1.3. Lactic acid normalized. Infectious disease Dr. Delaine Lame consulted.   Repeat blood cultures: No growth  Echo with no evidence of vegetation WBC count remains elevated, improving. UA positive LE no UTI sx's, chest x-ray unremarkable. Cefepime added for persistent leukocytosis. Discussed with ID, resumed of nafcillin  ID recommended 6 weeks of IV antibiotics if we are not pursuing TEE. Currently on Ancef.  Chronic back pain with severe degenerative arthritis: Chronic pain /history of spinal cord pain stimulator-- malfunctioning. Patient follows at pain clinic with Dr. Holley Raring He recently underwent lumbar spinal steroid injection on 05/17/2022.0 CRP 42.2, ESR 10.0 Neurosurgery consulting - Dr. Cari Caraway Pt has increased weakness in LE's left more than right 10/7: Spinal stimulator removed  10/7: Lumbar MRI w/wo contrast shows extensive infection 10/8: Laminectomy L2-3 and L5-S1 with epidural abscess evacuation Started scheduled Tylenol 1000 mg TID Continue PRN PO and IV pain meds  Trial of low dose Robaxin was too sedating for pt - d/c'd Continue gabapentin at bedtime.  Added daily PRN lower dose of 200 mg. CT lumbar spine no evidence of discitis, abscess or osteomyelitis. Follow abscess cultures from 10/9, ngtd For SNF   AKI on CKD stage IIIb:  Baseline creatinine 1.3 Aki now resolved Now off IV fluids Avoid nephrotoxic agents AKI resolved, it did recur briefly but mild, after being NPO for surgery yesterday and sedated with minimal PO intake. Now improving again and at baseline. --encourage PO hydration.    Urinary retention Foley placed 13 days ago - remove foley; voiding trial  Hypokalemia:  Monitor and replete as needed  Leukocytosis: improving Likely reactive due to steroids versus to infection. --WBC remains elevated, now slowly  improving Patient remained on daptomycin for staph bacteremia. Antibiotic changed to nafcillin. UA+ LE Added cefepime for gram-negative coverage. Now on Ancef  Hx of CAD with stent placed in LAD few years ago Hx. of atrial flutter status post ablation in the past.  Patient follows with [facility] cardiology --Aspirin and Plavix is on hold, will resume aspirin as is now greater than a week from EGD and per most recent cardiology note unclear why on dapt --Continue statin   Reactive thrombocytosis Monitor CBC  Hypothyroidism Continue Synthroid.   Hyperlipidemia  Continue Zetia.  Hypokalemia: Replaced.  Continue to monitor   Upper GI bleed: Acute blood loss anemia: Patient had 2 dark black stools. Hb trended down 10.7>9.7>7.7>8.5>7.0.Marland KitchenMarland Kitchen  Now stable Hbg 8.3 >> 8.1 >>8.3>>8.1>>7.4>>8.1>>8.2 10/6 --nursing reported recurrent dark stools, hemoglobin and stable 10/8 - Hbg 7.4, down from 8.1 after stimulator removal in OR yesterday.  Repeat in afternoon was 8.1 again. 10/9-10 - Hbg stable 8.2>8.1 GI consulted earlier this admission.   Patient underwent EGD on 10/3 which showed gastric and duodenal ulcers all nonbleeding with clean ulcer base (Forrest class III) RBC tagged scan negative for acute bleeding. Transfused 1 unit PRBC. Hold Plavix, resume aspirin as above  DVT prophylaxis:SCDs Code Status: DNR Family Communication: Patient's son updated at bedside today  Disposition Plan:   Status is: Inpatient Remains inpatient appropriate because:  Finalizing discharge plans    Consultants:  Neurosurgery Infectious disease  Procedures: None  Antimicrobials: Daptomycin, > nafcillin > Cefepime > Ancef  Subjective: Patient was awake resting in chair, back pain stable, worked with pt today   Objective: Vitals:   06/06/22 2049 06/07/22 0533 06/07/22 0826 06/07/22 1522  BP: 127/72 (!) 141/77 134/88   Pulse: 98 (!) 103 (!) 58   Resp: '20 17 16   ' Temp: 98.1 F (36.7 C) 98.8 F  (37.1 C) 98.5 F (36.9 C)   TempSrc:  Oral Oral   SpO2: 91% 94% 100% 94%  Weight:      Height:        Intake/Output Summary (Last 24 hours) at 06/07/2022 1548 Last data filed at 06/07/2022 1539 Gross per 24 hour  Intake 300.18 ml  Output 2465 ml  Net -2164.82 ml   Filed Weights   05/30/22 1028 05/31/22 1334  Weight: 122.5 kg 109.1 kg    Examination:  General exam: Awake resting in bed, conversational, no acute distress, does not seem confused this morning HEENT: Moist mucous membranes, patches of erythema and skin flaking on face and forehead improved today, hearing grossly normal  Respiratory system: CTAB, no wheezes, rales or rhonchi, normal respiratory effort. Cardiovascular system: normal S1/S2, RRR, no peripheral edema.   Gastrointestinal system: soft, NT, ND Central nervous system: Alert and oriented x4 grossly nonfocal exam, limited by somnolence Skin: dry, intact, normal temperature Psychiatry: normal mood, congruent affect, judgement and insight appear normal      Procedures: Echocardiogram and CT myelogram -- no evidence of vegetation, discitis, osteomyelitis or abscess.    EGD on 10/3 --- notable for multiple nonbleeding clean-based ulcers, one in the stomach and 2 in the duodenum.  10/7 - spinal stimulator removed   MRI lumbar spine later on 10/7 with extensive infection --discitis/osteomyelitis at L5-S1, suspected septic arthritis at bilateral facets at L4-5, extensive epidural phlegmon/abscess extending from L1-2 down through the sacrum with moderate to severe diffuse spinal stenosis, soft tissue edema and enhancement of  the lower paraspinal soft tissues (cellulitis/myositis), 2.4 cm paraspinal abscess along the left anterolateral vertebral column from L5-S1.  10/9 -underwent laminectomy of L2-3, and L5-S1 with evacuation of epidural abscess    LOS: 16 days     Desma Maxim, DO [facility]   If 7PM-7AM, please contact night-coverage

## 2022-06-07 NOTE — Progress Notes (Signed)
Occupational Therapy Treatment Patient Details Name: Anthony [name] MRN: [id number] DOB: Jul 04, 1936 Today's Date: 06/07/2022   History of present illness This 86 yrs old male with medical history significant of atrial flutter, status post ablation, chronic back pain status post stent spine stimulator which is malfunctioning, hypertension, hyperlipidemia, hypothyroidism, obesity, obstructive sleep apnea on CPAP comes to the emergency room with complaints of lower back pain radiating bilaterally to both legs, going down along with increased urinary frequency. Patient is found to have Staph aureus bacteremia, back pain is concerning for epidural abscess/early discitis.  Infectious diseases consulted recommended IV antibiotics.  S/P spinal cord stimulator removal (06/03/22) with continue upon transfer orders received. Pt is s/p lumbar laminectomy L2-3, L5-S1 and abscess evacuation 06/05/22.   OT comments  Upon entering session, pt resting in bed and agreeable to OT/PT co-treatment to maximize safety and participation. Tx session targeted improving tolerance for functional mobility in the setting of ADL tasks. Pt required Mod A for bed mobility with log rolling technique, Mod A +2 to stand from EOB, and Min A +2 to take steps toward recliner. Pt required VC throughout session 2/2 impulsiveness. Pt continues to be limited by back pain (10/10) impacting independence in ADLs. Pt left as received with all needs in reach. Pt is making progress toward goal completion. D/C recommendation remains appropriate. OT will continue to follow acutely.     Recommendations for follow up therapy are one component of a multi-disciplinary discharge planning process, led by the attending physician.  Recommendations may be updated based on patient status, additional functional criteria and insurance authorization.    Follow Up Recommendations  Skilled nursing-short term rehab (<3 hours/day)    Assistance Recommended at  Discharge Frequent or constant Supervision/Assistance  Patient can return home with the following  A lot of help with bathing/dressing/bathroom;Assistance with cooking/housework;Assist for transportation;Help with stairs or ramp for entrance;Two people to help with walking and/or transfers   Equipment Recommendations  Other (comment) (defer to next venue of care)    Recommendations for Other Services      Precautions / Restrictions Precautions Precautions: Fall;Back Precaution Booklet Issued: No Precaution Comments: log roll for comfort Restrictions Weight Bearing Restrictions: No       Mobility Bed Mobility Overal bed mobility: Needs Assistance Bed Mobility: Rolling, Sidelying to Sit Rolling: Mod assist Sidelying to sit: Mod assist, HOB elevated       General bed mobility comments: instructed in log roll technique    Transfers Overall transfer level: Needs assistance Equipment used: Rolling walker (2 wheels) Transfers: Sit to/from Stand Sit to Stand: Mod assist, +2 physical assistance, From elevated surface           General transfer comment: pt impulsively sat down in recliner despite VC for safety     Balance Overall balance assessment: Needs assistance Sitting-balance support: Feet supported Sitting balance-Leahy Scale: Good     Standing balance support: Bilateral upper extremity supported, During functional activity, Reliant on assistive device for balance Standing balance-Leahy Scale: Fair                             ADL either performed or assessed with clinical judgement   ADL Overall ADL's : Needs assistance/impaired                                     Functional mobility during  ADLs: +2 for physical assistance;Rolling walker (2 wheels);Minimal assistance;+2 for safety/equipment;Cueing for safety;Cueing for sequencing (for steps to recliner)      Extremity/Trunk Assessment Upper Extremity Assessment Upper Extremity  Assessment: Generalized weakness   Lower Extremity Assessment Lower Extremity Assessment: Generalized weakness        Vision Patient Visual Report: No change from baseline     Perception     Praxis      Cognition Arousal/Alertness: Awake/alert Behavior During Therapy: WFL for tasks assessed/performed, Impulsive Overall Cognitive Status: Within Functional Limits for tasks assessed                                 General Comments: VC for safety awareness throughout session, wants to do things a certain way/his way        Exercises      Shoulder Instructions       General Comments      Pertinent Vitals/ Pain       Pain Assessment Pain Assessment: 0-10 Pain Score: 10-Worst pain ever Pain Location: back pain Pain Descriptors / Indicators: Grimacing, Sore Pain Intervention(s): Monitored during session, Repositioned, Patient requesting pain meds-RN notified  Home Living                                          Prior Functioning/Environment              Frequency  Min 2X/week        Progress Toward Goals  OT Goals(current goals can now be found in the care plan section)  Progress towards OT goals: Progressing toward goals  Acute Rehab OT Goals Patient Stated Goal: reduce pain OT Goal Formulation: With patient Time For Goal Achievement: 06/20/22 Potential to Achieve Goals: Good  Plan Discharge plan remains appropriate;Frequency remains appropriate    Co-evaluation    PT/OT/SLP Co-Evaluation/Treatment: Yes Reason for Co-Treatment: For patient/therapist safety;To address functional/ADL transfers PT goals addressed during session: Mobility/safety with mobility;Balance;Proper use of DME OT goals addressed during session: ADL's and self-care;Proper use of Adaptive equipment and DME      AM-PAC OT "6 Clicks" Daily Activity     Outcome Measure   Help from another person eating meals?: None Help from another person  taking care of personal grooming?: A Little Help from another person toileting, which includes using toliet, bedpan, or urinal?: A Lot Help from another person bathing (including washing, rinsing, drying)?: A Lot Help from another person to put on and taking off regular upper body clothing?: A Lot Help from another person to put on and taking off regular lower body clothing?: A Lot 6 Click Score: 15    End of Session Equipment Utilized During Treatment: Rolling walker (2 wheels);Gait belt  OT Visit Diagnosis: Pain;Muscle weakness (generalized) (M62.81);Unsteadiness on feet (R26.81) Pain - part of body:  (back)   Activity Tolerance Patient limited by pain;Patient tolerated treatment well   Patient Left in chair;with call bell/phone within reach;with chair alarm set   Nurse Communication Patient requests pain meds;Mobility status        Time: 1453-1516 OT Time Calculation (min): 23 min  Charges: OT General Charges $OT Visit: 1 Visit OT Treatments $Self Care/Home Management : 8-22 mins  [facility] MS, OTR/L ascom [phone]  06/07/22, 5:52 PM

## 2022-06-07 NOTE — Progress Notes (Signed)
Physical Therapy Treatment Patient Details Name: Anthony [name] MRN: [id number] DOB: [birth year] Today's Date: 06/07/2022   History of Present Illness This 86 yrs old male with medical history significant of atrial flutter, status post ablation, chronic back pain status post stent spine stimulator which is malfunctioning, hypertension, hyperlipidemia, hypothyroidism, obesity, obstructive sleep apnea on CPAP comes to the emergency room with complaints of lower back pain radiating bilaterally to both legs, going down along with increased urinary frequency. Patient is found to have Staph aureus bacteremia, back pain is concerning for epidural abscess/early discitis.  Infectious diseases consulted recommended IV antibiotics.  S/P spinal cord stimulator removal (06/03/22) with continue upon transfer orders received. Pt is s/p lumbar laminectomy L2-3, L5-S1 and abscess evacuation 06/05/22.    PT Comments    Patient received in bed, requesting to move per RN. Patient reports 10/10 pain with sitting up on the side of the bed. He requires Mod A for bed mobility. Mod +2 for sit to stand from elevated bed. He is able to ambulate 4 feet with RW with cues for safe proximity to RW and hand placement. Patient sat prematurely after 4 feet due to pain. He will continue to benefit from skilled PT while here to improve safety and functional independence.       Recommendations for follow up therapy are one component of a multi-disciplinary discharge planning process, led by the attending physician.  Recommendations may be updated based on patient status, additional functional criteria and insurance authorization.  Follow Up Recommendations  Skilled nursing-short term rehab (<3 hours/day) Can patient physically be transported by private vehicle: No   Assistance Recommended at Discharge Frequent or constant Supervision/Assistance  Patient can return home with the following A lot of help with  bathing/dressing/bathroom;Assistance with cooking/housework;Direct supervision/assist for medications management;Direct supervision/assist for financial management;Assist for transportation;Help with stairs or ramp for entrance;A lot of help with walking and/or transfers   Equipment Recommendations  None recommended by PT    Recommendations for Other Services       Precautions / Restrictions Precautions Precautions: Fall;Back Precaution Booklet Issued: No Precaution Comments: log roll for comfort Restrictions Weight Bearing Restrictions: No     Mobility  Bed Mobility Overal bed mobility: Needs Assistance Bed Mobility: Rolling, Sidelying to Sit Rolling: Mod assist Sidelying to sit: Mod assist, HOB elevated       General bed mobility comments: instructed in log roll technique    Transfers Overall transfer level: Needs assistance Equipment used: Rolling walker (2 wheels) Transfers: Sit to/from Stand Sit to Stand: Mod assist, +2 physical assistance, From elevated surface                Ambulation/Gait Ambulation/Gait assistance: Min assist, +2 physical assistance, +2 safety/equipment Gait Distance (Feet): 4 Feet Assistive device: Rolling walker (2 wheels) Gait Pattern/deviations: Step-to pattern, Trunk flexed       General Gait Details: ambulated to recliner, sat prematurely despite cues for safety   Stairs             Wheelchair Mobility    Modified Rankin (Stroke Patients Only)       Balance Overall balance assessment: Needs assistance Sitting-balance support: Feet supported Sitting balance-Leahy Scale: Good     Standing balance support: Bilateral upper extremity supported, During functional activity, Reliant on assistive device for balance Standing balance-Leahy Scale: Fair Standing balance comment: impulsive at times  Cognition Arousal/Alertness: Awake/alert Behavior During Therapy: WFL for tasks  assessed/performed, Impulsive Overall Cognitive Status: Within Functional Limits for tasks assessed                         Following Commands: Follows one step commands consistently, Follows one step commands with increased time     Problem Solving: Decreased initiation, Requires verbal cues, Requires tactile cues General Comments: VC for safety awareness throughout session, wants to do things a certain way/his way        Exercises      General Comments        Pertinent Vitals/Pain Pain Assessment Pain Assessment: 0-10 Pain Score: 10-Worst pain ever Pain Location: back pain Pain Descriptors / Indicators: Grimacing, Sore Pain Intervention(s): Monitored during session, Repositioned, Patient requesting pain meds-RN notified    Home Living                          Prior Function            PT Goals (current goals can now be found in the care plan section) Acute Rehab PT Goals Patient Stated Goal: none stated PT Goal Formulation: With patient Time For Goal Achievement: 06/18/22 Potential to Achieve Goals: Fair Progress towards PT goals: Progressing toward goals    Frequency    7X/week      PT Plan Current plan remains appropriate;Frequency needs to be updated    Co-evaluation PT/OT/SLP Co-Evaluation/Treatment: Yes   PT goals addressed during session: Mobility/safety with mobility;Balance;Proper use of DME        AM-PAC PT "6 Clicks" Mobility   Outcome Measure  Help needed turning from your back to your side while in a flat bed without using bedrails?: A Lot Help needed moving from lying on your back to sitting on the side of a flat bed without using bedrails?: A Lot Help needed moving to and from a bed to a chair (including a wheelchair)?: A Lot Help needed standing up from a chair using your arms (e.g., wheelchair or bedside chair)?: A Lot Help needed to walk in hospital room?: A Lot Help needed climbing 3-5 steps with a railing? :  A Lot 6 Click Score: 12    End of Session Equipment Utilized During Treatment: Gait belt Activity Tolerance: Patient limited by pain Patient left: in chair;with call bell/phone within reach Nurse Communication: Mobility status PT Visit Diagnosis: Unsteadiness on feet (R26.81);Other abnormalities of gait and mobility (R26.89);Muscle weakness (generalized) (M62.81);Repeated falls (R29.6);Pain;Difficulty in walking, not elsewhere classified (R26.2) Pain - Right/Left:  (center) Pain - part of body:  (back)     Time: 7846-9629 PT Time Calculation (min) (ACUTE ONLY): 15 min  Charges:  $Gait Training: 8-22 mins                     Drianna Chandran, PT, GCS 06/07/22,3:35 PM

## 2022-06-07 NOTE — Plan of Care (Signed)

## 2022-06-08 DIAGNOSIS — R4182 Altered mental status, unspecified: Secondary | ICD-10-CM | POA: Diagnosis not present

## 2022-06-08 DIAGNOSIS — M5136 Other intervertebral disc degeneration, lumbar region: Secondary | ICD-10-CM | POA: Diagnosis not present

## 2022-06-08 DIAGNOSIS — I10 Essential (primary) hypertension: Secondary | ICD-10-CM | POA: Diagnosis not present

## 2022-06-08 DIAGNOSIS — N138 Other obstructive and reflux uropathy: Secondary | ICD-10-CM | POA: Diagnosis not present

## 2022-06-08 DIAGNOSIS — E039 Hypothyroidism, unspecified: Secondary | ICD-10-CM | POA: Diagnosis not present

## 2022-06-08 DIAGNOSIS — G062 Extradural and subdural abscess, unspecified: Secondary | ICD-10-CM | POA: Diagnosis not present

## 2022-06-08 DIAGNOSIS — R770 Abnormality of albumin: Secondary | ICD-10-CM | POA: Diagnosis not present

## 2022-06-08 DIAGNOSIS — N401 Enlarged prostate with lower urinary tract symptoms: Secondary | ICD-10-CM | POA: Diagnosis not present

## 2022-06-08 DIAGNOSIS — N4289 Other specified disorders of prostate: Secondary | ICD-10-CM | POA: Diagnosis not present

## 2022-06-08 DIAGNOSIS — M549 Dorsalgia, unspecified: Secondary | ICD-10-CM | POA: Diagnosis not present

## 2022-06-08 DIAGNOSIS — N179 Acute kidney failure, unspecified: Secondary | ICD-10-CM | POA: Diagnosis not present

## 2022-06-08 DIAGNOSIS — Z743 Need for continuous supervision: Secondary | ICD-10-CM | POA: Diagnosis not present

## 2022-06-08 DIAGNOSIS — K922 Gastrointestinal hemorrhage, unspecified: Secondary | ICD-10-CM | POA: Diagnosis not present

## 2022-06-08 DIAGNOSIS — N183 Chronic kidney disease, stage 3 unspecified: Secondary | ICD-10-CM | POA: Diagnosis not present

## 2022-06-08 DIAGNOSIS — J99 Respiratory disorders in diseases classified elsewhere: Secondary | ICD-10-CM | POA: Diagnosis not present

## 2022-06-08 DIAGNOSIS — M488X6 Other specified spondylopathies, lumbar region: Secondary | ICD-10-CM | POA: Diagnosis not present

## 2022-06-08 DIAGNOSIS — M4647 Discitis, unspecified, lumbosacral region: Secondary | ICD-10-CM | POA: Diagnosis not present

## 2022-06-08 DIAGNOSIS — D649 Anemia, unspecified: Secondary | ICD-10-CM | POA: Diagnosis not present

## 2022-06-08 DIAGNOSIS — G4700 Insomnia, unspecified: Secondary | ICD-10-CM | POA: Diagnosis not present

## 2022-06-08 DIAGNOSIS — M4626 Osteomyelitis of vertebra, lumbar region: Secondary | ICD-10-CM | POA: Diagnosis not present

## 2022-06-08 DIAGNOSIS — A419 Sepsis, unspecified organism: Secondary | ICD-10-CM | POA: Diagnosis not present

## 2022-06-08 DIAGNOSIS — M4620 Osteomyelitis of vertebra, site unspecified: Secondary | ICD-10-CM | POA: Diagnosis not present

## 2022-06-08 DIAGNOSIS — E871 Hypo-osmolality and hyponatremia: Secondary | ICD-10-CM | POA: Diagnosis not present

## 2022-06-08 DIAGNOSIS — M6259 Muscle wasting and atrophy, not elsewhere classified, multiple sites: Secondary | ICD-10-CM | POA: Diagnosis not present

## 2022-06-08 DIAGNOSIS — N419 Inflammatory disease of prostate, unspecified: Secondary | ICD-10-CM | POA: Diagnosis not present

## 2022-06-08 DIAGNOSIS — Z6835 Body mass index (BMI) 35.0-35.9, adult: Secondary | ICD-10-CM | POA: Diagnosis not present

## 2022-06-08 DIAGNOSIS — Z792 Long term (current) use of antibiotics: Secondary | ICD-10-CM | POA: Diagnosis not present

## 2022-06-08 DIAGNOSIS — Z955 Presence of coronary angioplasty implant and graft: Secondary | ICD-10-CM | POA: Diagnosis not present

## 2022-06-08 DIAGNOSIS — T85192A Other mechanical complication of implanted electronic neurostimulator (electrode) of spinal cord, initial encounter: Secondary | ICD-10-CM | POA: Diagnosis not present

## 2022-06-08 DIAGNOSIS — B957 Other staphylococcus as the cause of diseases classified elsewhere: Secondary | ICD-10-CM | POA: Diagnosis not present

## 2022-06-08 DIAGNOSIS — R279 Unspecified lack of coordination: Secondary | ICD-10-CM | POA: Diagnosis not present

## 2022-06-08 DIAGNOSIS — R7881 Bacteremia: Secondary | ICD-10-CM | POA: Diagnosis not present

## 2022-06-08 DIAGNOSIS — N189 Chronic kidney disease, unspecified: Secondary | ICD-10-CM | POA: Diagnosis not present

## 2022-06-08 DIAGNOSIS — Z96653 Presence of artificial knee joint, bilateral: Secondary | ICD-10-CM | POA: Diagnosis not present

## 2022-06-08 DIAGNOSIS — E785 Hyperlipidemia, unspecified: Secondary | ICD-10-CM | POA: Diagnosis not present

## 2022-06-08 DIAGNOSIS — Z736 Limitation of activities due to disability: Secondary | ICD-10-CM | POA: Diagnosis not present

## 2022-06-08 DIAGNOSIS — Z741 Need for assistance with personal care: Secondary | ICD-10-CM | POA: Diagnosis not present

## 2022-06-08 DIAGNOSIS — R2689 Other abnormalities of gait and mobility: Secondary | ICD-10-CM | POA: Diagnosis not present

## 2022-06-08 DIAGNOSIS — M6281 Muscle weakness (generalized): Secondary | ICD-10-CM | POA: Diagnosis not present

## 2022-06-08 DIAGNOSIS — R6520 Severe sepsis without septic shock: Secondary | ICD-10-CM | POA: Diagnosis not present

## 2022-06-08 DIAGNOSIS — G061 Intraspinal abscess and granuloma: Secondary | ICD-10-CM | POA: Diagnosis not present

## 2022-06-08 DIAGNOSIS — Z79890 Hormone replacement therapy (postmenopausal): Secondary | ICD-10-CM | POA: Diagnosis not present

## 2022-06-08 DIAGNOSIS — R339 Retention of urine, unspecified: Secondary | ICD-10-CM | POA: Diagnosis not present

## 2022-06-08 DIAGNOSIS — I2510 Atherosclerotic heart disease of native coronary artery without angina pectoris: Secondary | ICD-10-CM | POA: Diagnosis not present

## 2022-06-08 DIAGNOSIS — G4733 Obstructive sleep apnea (adult) (pediatric): Secondary | ICD-10-CM | POA: Diagnosis not present

## 2022-06-08 LAB — BASIC METABOLIC PANEL
Anion gap: 8 (ref 5–15)
BUN: 24 mg/dL — ABNORMAL HIGH (ref 8–23)
CO2: 30 mmol/L (ref 22–32)
Calcium: 9.9 mg/dL (ref 8.9–10.3)
Chloride: 97 mmol/L — ABNORMAL LOW (ref 98–111)
Creatinine, Ser: 1.09 mg/dL (ref 0.61–1.24)
GFR, Estimated: >60 mL/min (ref >60)
Glucose, Bld: 153 mg/dL — ABNORMAL HIGH (ref 70–99)
Potassium: 3.9 mmol/L (ref 3.5–5.1)
Sodium: 135 mmol/L (ref 135–145)

## 2022-06-08 LAB — CBC
HCT: 25.6 % — ABNORMAL LOW (ref 39.0–52.0)
Hemoglobin: 8.2 g/dL — ABNORMAL LOW (ref 13.0–17.0)
MCH: 29.8 pg (ref 26.0–34.0)
MCHC: 32 g/dL (ref 30.0–36.0)
MCV: 93.1 fL (ref 80.0–100.0)
Platelets: 536 10*3/uL — ABNORMAL HIGH (ref 150–400)
RBC: 2.75 MIL/uL — ABNORMAL LOW (ref 4.22–5.81)
RDW: 14.7 % (ref 11.5–15.5)
WBC: 14.2 10*3/uL — ABNORMAL HIGH (ref 4.0–10.5)
nRBC: 0 % (ref 0.0–0.2)

## 2022-06-08 MED ORDER — OXYCODONE HCL 5 MG PO TABS: 5.0000 mg | ORAL_TABLET | ORAL | 0 refills | Status: DC | PRN

## 2022-06-08 MED ORDER — SODIUM CHLORIDE 0.9% FLUSH: 10.0000 mL | INTRAVENOUS | Status: DC | PRN

## 2022-06-08 MED ORDER — SODIUM CHLORIDE 0.9% FLUSH
10.0000 mL | Freq: Two times a day (BID) | INTRAVENOUS | Status: DC
  Administered 2022-06-08: 10 mL

## 2022-06-08 MED ORDER — PANTOPRAZOLE SODIUM 40 MG PO TBEC: 40.0000 mg | DELAYED_RELEASE_TABLET | Freq: Two times a day (BID) | ORAL | 1 refills | Status: DC

## 2022-06-08 NOTE — Plan of Care (Signed)

## 2022-06-08 NOTE — Progress Notes (Signed)
Peripherally Inserted Central Catheter Placement  The IV Nurse has discussed with the patient and/or persons authorized to consent for the patient, the purpose of this procedure and the potential benefits and risks involved with this procedure.  The benefits include less needle sticks, lab draws from the catheter, and the patient may be discharged home with the catheter. Risks include, but not limited to, infection, bleeding, blood clot (thrombus formation), and puncture of an artery; nerve damage and irregular heartbeat and possibility to perform a PICC exchange if needed/ordered by physician.  Alternatives to this procedure were also discussed.  Bard Power PICC patient education guide, fact sheet on infection prevention and patient information card has been provided to patient /or left at bedside.  verbal consent obtained from patient due to inability to physically sign consent.  Telephone consent also obtained from son, Qasim Diveley.  PICC Placement Documentation  PICC Single Lumen 26/83/41 Right Basilic 44 cm 0 cm (Active)  Indication for Insertion or Continuance of Line Prolonged intravenous therapies;Home intravenous therapies (PICC only) 06/08/22 1224  Exposed Catheter (cm) 0 cm 06/08/22 1224  Site Assessment Clean, Dry, Intact 06/08/22 1224  Line Status Flushed;Saline locked;Blood return noted 06/08/22 1224  Dressing Type Transparent;Securing device 06/08/22 1224  Dressing Status Antimicrobial disc in place;Clean, Dry, Intact 06/08/22 1224  Safety Lock Not Applicable 96/22/29 7989  Line Care Connections checked and tightened 06/08/22 1224  Line Adjustment (NICU/IV Team Only) No 06/08/22 1224  Dressing Intervention New dressing 06/08/22 1224  Dressing Change Due 06/15/22 06/08/22 Mahtomedi, Nicolette Bang 06/08/2022, 12:25 PM

## 2022-06-08 NOTE — Progress Notes (Addendum)
Patient discharging to SNF. Report given to admitting RN. All questions answered. Waiting on transport for now. Discharging with foley in and PICC line single lumen.

## 2022-06-08 NOTE — Discharge Summary (Addendum)
Anthony [name] JTT:[id number] DOB: 03-10-1936 DOA: 05/22/2022  PCP: Ria Bush, MD  Admit date: 05/22/2022 Discharge date: 06/08/2022  Time spent: 40 minutes  Recommendations for Outpatient Follow-up:  Urology follow-up 2 weeks Follow up gastroenterology (Dr. Vicente Males) in 2 months Neurosurgery f/u 2 weeks   OPAT orders per Dr. Delaine Lame  Discharge antibiotics: Cefazolin 2 grams IV every 8 hours  Duration: 6 weeks End Date: 07/16/22   [facility] Care Per Protocol:   Labs weekly every Monday while on IV antibiotics: X__ CBC with differential   _X_ CMP _X_ CRP _X_ ESR     _X_ Please pull PIC at completion of IV antibiotics _ Fax weekly lab results  promptly to [phone]   Clinic Follow Up Appt: 06/29/22 at 9 am     Call [phone] with any questions    Discharge Diagnoses:  Principal Problem:   Sepsis ([place]) Active Problems:   Coag negative Staphylococcus bacteremia   Gastrointestinal hemorrhage with melena   Failed spinal cord stimulator (HCC)   Weakness   Abscess in epidural space of lumbar spine   Intraspinal abscess and granuloma   Discitis of lumbosacral region   Vertebral osteomyelitis [facility])   Discharge Condition: stable  Diet recommendation: heart healthy  Filed Weights   05/30/22 1028 05/31/22 1334  Weight: 122.5 kg 109.1 kg    History of present illness:  From admission h and  Anthony [name] is a 86 y.o. male with medical history significant of atrial flutter is status post ablation, chronic back pain status post stent spine stimulator which is malfunctioning, hypertension, hyperlipidemia, hypothyroidism, obesity, obstructive sleep apnea on CPAP comes to the emergency room with complaints of back pain lower radiating bilaterally to both legs going down along with increased urinary frequency.   Patient has history of pain stimulator in the lower back which has been malfunctioning. He was seen by neurosurgery in 2022 and then was lost to  follow-up. He follows with pain clinic Dr. Maricela Curet and got a low wall lumbar spine steroid injection on 20 September. Patient said he started having worsening pain in the next 1 to 2 days and to the point he was unable to ambulate and today his legs gave out and he landed on his back. No family at bedside. Talked with son Dr. [name].   In the ER, patient is afebrile tachycardic with heart rate in the 120s, tachypnea, elevated white count of 20 1K, lactic acid went up to 2.0. Blood culture urine culture Ravindran. Received one dose of IV Rocephin given symptoms of incontinence/frequency. He received some IV pain meds and is being admitted for further evaluation management. Patient is being admitted with sepsis unknown etiology and back pain.   Was also found to have elevated creatinine baseline creatinine around 1.3. Came in with creatinine of 2.62. Patient received 1.5 L of IV fluid. He has been having poor PO intake for last few days.  Hospital Course:  Patient presented with sepsis from staph capitis bacteremia from epidural abscess. TTE negative, TEE not pursued. ID followed. Treating bacteremia with abscess through 11/19 will need weekly cbc with diff, cmp, crp, and esr, pull picc at completion of abx. For epidural abscess this was treated with removal of spinal cord stimulator as well as epidural evacuation with neurosurgery. Hospital course also complicated by urinary retention. Trial of void attempted but he failed this and so foley was replaced prior to discharge, he will need outpatient urology f/u in about 2 weeks. Hospital  course also complicated by upper gi bleed. Upper endoscopy performed showing gastric and duodenal ulcers. Aspirin has been re-started but will hold plavix for the time being. Will need BID pantoprazole for 3 months. Lisinopril was held secondary to [facility]. AKI resolved, consider adding back lisinopril in outpatient setting.  Procedures: See above    Consultations: GI, ID, neurosurgery  Discharge Exam: Vitals:   06/07/22 2010 06/08/22 0617  BP: 126/77 125/82  Pulse: (!) 106 100  Resp: 16 18  Temp:  98.2 F (36.8 C)  SpO2: 98% 94%    General exam: Awake resting in bed, conversational, no acute distress, does not seem confused this morning HEENT: Moist mucous membranes, patches of erythema and skin flaking on face and forehead improved today, hearing grossly normal  Respiratory system: CTAB, no wheezes, rales or rhonchi, normal respiratory effort. Cardiovascular system: normal S1/S2, RRR, no peripheral edema.   Gastrointestinal system: soft, NT, ND Central nervous system: Alert and oriented x4 grossly nonfocal exam, limited by somnolence Skin: dry, intact, normal temperature Psychiatry: normal mood, congruent affect, judgement and insight appear normal  Discharge Instructions   Discharge Instructions     Ambulatory referral to Urology   Complete by: As directed    Diet - low sodium heart healthy   Complete by: As directed    Discharge wound care:   Complete by: As directed    Remove dressings on back 10/13   Increase activity slowly   Complete by: As directed       Allergies as of 06/08/2022       Reactions   Questran [cholestyramine]    Patient not aware of an allergy to this medicine.   Atorvastatin    Muscle pain   Gemfibrozil Other (See Comments)   Muscle pain.   Metformin And Related    Dizziness   Rosuvastatin    Muscle pain        Medication List     STOP taking these medications    lisinopril 10 MG tablet Commonly known as: ZESTRIL   meloxicam 7.5 MG tablet Commonly known as: MOBIC   naproxen sodium 220 MG tablet Commonly known as: ALEVE   oxyCODONE-acetaminophen 7.5-325 MG tablet Commonly known as: PERCOCET       TAKE these medications    acetaminophen 500 MG tablet Commonly known as: TYLENOL Take 1 tablet (500 mg total) by mouth 2 (two) times daily as needed for moderate  pain.   amLODipine 5 MG tablet Commonly known as: NORVASC TAKE 1 TABLET(5 MG) BY MOUTH DAILY What changed: See the new instructions.   aspirin EC 81 MG tablet Take 1 tablet (81 mg total) by mouth daily.   ciclopirox 8 % solution Commonly known as: PENLAC APPLY TOPICALLY AT BEDTIME APPLY OVER NAIL AND SURROUNDING SKIN, APPLY DAILY OVER PREVIOUS COAT, AFTER 7 DAYS MAY REMOVE WITH ALCOHOL AND REPEAT   clopidogrel 75 MG tablet Commonly known as: PLAVIX TAKE 1 TABLET(75 MG) BY MOUTH DAILY AT 6 AM   diphenhydramine-acetaminophen 25-500 MG Tabs tablet Commonly known as: TYLENOL PM Take 2 tablets by mouth at bedtime.   docusate sodium 100 MG capsule Commonly known as: COLACE Take 100 mg by mouth daily.   Eszopiclone 3 MG Tabs Take 1 tablet (3 mg total) by mouth at bedtime as needed. Use sparingly, take immediately before bedtime   ezetimibe 10 MG tablet Commonly known as: ZETIA Take 1 tablet (10 mg total) by mouth daily.   finasteride 5 MG tablet Commonly known as:  PROSCAR TAKE 1 TABLET(5 MG) BY MOUTH DAILY   fluocinonide 0.05 % external solution Commonly known as: LIDEX Apply 1 application topically 2 (two) times daily as needed. What changed:  when to take this reasons to take this   furosemide 40 MG tablet Commonly known as: LASIX TAKE 1 TABLET BY MOUTH EVERY DAY. TAKE AN EXTRA TABLET AS NEEDED FOR WEIGHT GAIN OF 3 LBS/DAY OR 5LBS/WEEK   gabapentin 300 MG capsule Commonly known as: NEURONTIN TAKE 2 CAPSULE BY MOUTH AT BEDTIME WITH EXTRA CAPSULE DURING THE DAY AS NEEDED   icosapent Ethyl 1 g capsule Commonly known as: VASCEPA Take 2 capsules (2 g total) by mouth 2 (two) times daily.   ketoconazole 2 % cream Commonly known as: NIZORAL APPLY TO FEET AND FACE AT BEDTIME AS DIRECTED   ketoconazole 2 % shampoo Commonly known as: NIZORAL APPLY TOPICALLY 3 TIMES WEEKLY AS DIRECTED   levothyroxine 175 MCG tablet Commonly known as: SYNTHROID Take 1 tablet (175 mcg  total) by mouth daily before breakfast.   Linzess 290 MCG Caps capsule Generic drug: linaclotide TAKE 1 CAPSULE(290 MCG) BY MOUTH DAILY BEFORE BREAKFAST   Melatonin 10 MG Caps Take 10 mg by mouth at bedtime.   mometasone 0.1 % cream Commonly known as: ELOCON APPLY TOPICALLY TO THE AFFECTED AREA DAILY AS NEEDED FOR RASH   mometasone 0.1 % lotion Commonly known as: ELOCON APPLY TO SCALP AT BEDTIME ON MONDAY, WEDNESDAY, AND FRIDAY   mupirocin ointment 2 % Commonly known as: BACTROBAN Apply 1 Application topically daily. With dressing changes   nitroGLYCERIN 0.4 MG SL tablet Commonly known as: NITROSTAT Place 1 tablet (0.4 mg total) under the tongue every 5 (five) minutes as needed for chest pain.   ondansetron 4 MG tablet Commonly known as: ZOFRAN Take 1 tablet (4 mg total) by mouth every 6 (six) hours as needed for nausea.   oxyCODONE 5 MG immediate release tablet Commonly known as: Oxy IR/ROXICODONE Take 1 tablet (5 mg total) by mouth every 4 (four) hours as needed for moderate pain, severe pain or breakthrough pain.   pantoprazole 40 MG tablet Commonly known as: Protonix Take 1 tablet (40 mg total) by mouth 2 (two) times daily.   Praluent 75 MG/ML Soaj Generic drug: Alirocumab Inject 1 pen. into the skin every 14 (fourteen) days.   PRESERVISION AREDS 2 PO Take 1 tablet by mouth daily.   CENTRUM SILVER PO Take 1 tablet by mouth daily.   senna 8.6 MG tablet Commonly known as: SENOKOT Take 1 tablet (8.6 mg total) by mouth daily.   tamsulosin 0.4 MG Caps capsule Commonly known as: FLOMAX Take 1 capsule (0.4 mg total) by mouth daily.   tiZANidine 2 MG tablet Commonly known as: ZANAFLEX TAKE 1 TO 2 TABLETS(2 TO 4 MG) BY MOUTH TWICE DAILY AS NEEDED FOR MUSCLE SPASMS   traZODone 50 MG tablet Commonly known as: DESYREL Take 1 tablet (50 mg total) by mouth at bedtime as needed for sleep.   Vitamin D3 25 MCG (1000 UT) Caps Take 1 capsule (1,000 Units total) by  mouth daily.               Discharge Care Instructions  (From admission, onward)           Start     Ordered   06/08/22 0000  Discharge wound care:       Comments: Remove dressings on back 10/13   06/08/22 1427  Allergies  Allergen Reactions   Questran [Cholestyramine]     Patient not aware of an allergy to this medicine.   Atorvastatin     Muscle pain   Gemfibrozil Other (See Comments)    Muscle pain.   Metformin And Related     Dizziness   Rosuvastatin     Muscle pain    Contact information for follow-up providers     Jonathon Bellows, MD Follow up.   Specialty: Gastroenterology Why: 2 months Contact information: [place] [address] [phone]              Contact information for after-discharge care     Destination     [facility]erred [facility] .   Service: Skilled Nursing Contact information: [address] [place] [phone]                      The results of significant diagnostics from this hospitalization (including imaging, microbiology, ancillary and laboratory) are listed below for reference.    Significant Diagnostic Studies: [name] EKG SITE RITE  Result Date: 06/07/2022 If Site Rite image not attached, placement could not be confirmed due to current cardiac rhythm.  DG Lumbar Spine 1 View  Result Date: 06/05/2022 CLINICAL DATA:  Surgery, elective EXAM: LUMBAR SPINE - 1 VIEW COMPARISON:  Fluoroscopy June 03 2022. MRI lumbar spine June 03, 2022. FINDINGS: Two intraoperative fluoroscopic lateral radiographs. The first image demonstrates surgical probe posteriorly at the L5 level. The second image demonstrates surgical probe posteriorly at the L2-L3 level. Please see the performing provider's procedural report for further detail. IMPRESSION: Intraoperative fluoroscopy. Electronically Signed   By: Margaretha Sheffield M.D.   On: 06/05/2022  16:07   DG C-Arm 1-60 Min-No Report  Result Date: 06/05/2022 Fluoroscopy was utilized by the requesting physician.  No radiographic interpretation.   DG C-Arm 1-60 Min-No Report  Result Date: 06/05/2022 Fluoroscopy was utilized by the requesting physician.  No radiographic interpretation.   MR Lumbar Spine W Wo Contrast  Result Date: 06/03/2022 CLINICAL DATA:  Initial evaluation for low back pain, prior surgery, new symptoms. EXAM: MRI LUMBAR SPINE WITHOUT AND WITH CONTRAST TECHNIQUE: Multiplanar and multiecho pulse sequences of the lumbar spine were obtained without and with intravenous contrast. CONTRAST:  84m GADAVIST GADOBUTROL 1 MMOL/ML IV SOLN COMPARISON:  Previous radiograph from earlier the same day as well as prior CT from 05/26/2022. FINDINGS: Segmentation: Standard. Lowest well-formed disc space labeled the L5-S1 level. Alignment: Exaggeration of the normal lumbar lordosis with mild stepwise grade 1 retrolisthesis of L2 on L3 through L4 on L5. Vertebrae: Vertebral body height maintained without acute or chronic fracture. Abnormal T1 hypointensity, STIR hyperintensity with enhancement seen about the L5-S1 interspace. The L5-S1 interspace is somewhat widened with fluid signal intensity within the L5-S1 disc. Findings concerning for osteomyelitis discitis. Associated epidural involvement with components are seen on the right at the level of L2 (series 8, image 15) and on the left at L3-4 (series 8, image 22). Irregular circumferential epidural involvement noted inferiorly at L3-4 through the sacrum. Suspected associated septic arthritis involving the bilateral L4-5 facets. Resultant moderate to severe diffuse spinal stenosis, described below. Underlying bone marrow signal intensity otherwise within normal limits. No worrisome osseous lesions. Scattered degenerative reactive endplate changes noted elsewhere throughout the lumbar spine. Conus medullaris and cauda equina: Conus extends to the L1  level. Conus medullaris within normal limits. Nerve roots of the cauda  equina are irregular and undulating due to distal stenosis. Paraspinal and other soft tissues: Diffuse edema and enhancement throughout the lower posterior paraspinous soft tissues, most pronounced at the level of L4-5 due to adjacent facet arthritis. Few probable tiny subcentimeter soft tissue collections adjacent to the L4-5 facets (series 10, images 6, 14). These are extremely small and subcentimeter in size, and likely not drainable. 2.4 cm fluid collection seen along the left anterolateral lateral aspect of the lower vertebral column, emanating from the L5-S1 interspace (series 7, image 18), suspicious for abscess. Few scattered T2 hyperintense cyst noted about the visualized kidneys, benign in appearance, no follow-up imaging recommended. Disc levels: T11-12: Seen only on sagittal projection. Advanced degenerative intervertebral disc space narrowing with diffuse disc bulge and reactive endplate change. Moderate facet arthrosis. Mild spinal stenosis. Severe right with moderate left foraminal narrowing. T12-L1: Disc bulge, asymmetric to the left. Associated left-sided reactive endplate spurring. Mild to moderate left greater than right facet hypertrophy. Small epidural abscess posteriorly. No more than mild spinal stenosis at this level. Moderate left foraminal narrowing. L1-2: Advanced degenerative intervertebral disc space narrowing with diffuse disc bulge and reactive endplate spurring. Moderate facet hypertrophy. Epidural abscess posteriorly, greatest at the right dorsal epidural space. Resultant moderate to severe spinal stenosis. Mild left greater than right L1 foraminal narrowing. L2-3: Degenerative intervertebral disc space narrowing with diffuse disc bulge and reactive endplate spurring. Disc bulging eccentric to the right. Moderate facet hypertrophy. Epidural abscess posteriorly. Resultant severe spinal stenosis. Foramina remain  patent. L3-4: Advanced degenerative intervertebral disc space narrowing with diffuse disc bulge and reactive endplate spurring. Moderate facet hypertrophy. Posterior epidural abscess, greatest at the left dorsal epidural space. Resultant severe canal with right lateral recess stenosis. Moderate right L3 foraminal narrowing. Left neural foramen remains patent. L4-5: Degenerate intervertebral disc space narrowing with diffuse disc bulge and reactive endplate spurring, eccentric to the left. Severe bilateral facet arthrosis with findings concerning for acute septic arthritis. Circumferential epidural phlegmon/abscess. Resultant severe spinal stenosis. Moderate bilateral L4 foraminal narrowing. L5-S1: Changes concerning for osteomyelitis discitis as above. Underlying mild disc bulge with endplate spurring. Probable superimposed chronic bilateral pars defects noted. Underlying moderate facet hypertrophy. Circumferential epidural phlegmon/abscess compresses the distal thecal sac with resultant severe spinal stenosis. Phlegmon extends to involve the bilateral foramina with resultant moderate bilateral foraminal stenosis. IMPRESSION: 1. Findings concerning for osteomyelitis discitis at L5-S1, with additional suspected changes of bilateral septic arthritis about the bilateral L4-5 facets. 2. Associated extensive epidural phlegmon/abscess extending from L1-2 through the sacrum with resultant moderate to severe diffuse spinal stenosis. 3. Diffuse soft tissue edema and enhancement throughout the lower paraspinous soft tissues, consistent with regional cellulitis/myositis. Probable 2.4 cm paraspinous abscess along the left anterolateral aspect of the lower vertebral column, emanating from the L5-S1 interspace as above. 4. Underlying advanced multilevel degenerative spondylosis as detailed above. Critical Value/emergent results were called by telephone at the time of interpretation on 06/03/2022 at 7:37 pm to provider Sharion Settler, who verbally acknowledged these results. Electronically Signed   By: Jeannine Boga M.D.   On: 06/03/2022 19:44   DG Lumbar Spine 2-3 Views  Result Date: 06/03/2022 CLINICAL DATA:  Removal of spinal cord stimulator. EXAM: LUMBAR SPINE - 2-3 VIEW COMPARISON:  None Available. FINDINGS: Two PA views of the lower thoracic spine were obtained intraoperatively. The 1st shows neurostimulator leads within the lower thoracic spinal canal. On the 2nd image, retractors are seen in place, but the neurostimulator leads have been removed. IMPRESSION: Removal of  spinal cord stimulator leads. Electronically Signed   By: Marlaine Hind M.D.   On: 06/03/2022 10:18   DG C-Arm 1-60 Min-No Report  Result Date: 06/03/2022 Fluoroscopy was utilized by the requesting physician.  No radiographic interpretation.   NM GI Blood Loss  Result Date: 05/29/2022 CLINICAL DATA:  Dark bowel movements containing blood today, history of diverticulitis EXAM: NUCLEAR MEDICINE GASTROINTESTINAL BLEEDING SCAN TECHNIQUE: Sequential abdominal images were obtained following intravenous administration of Tc-52mlabeled red blood cells. RADIOPHARMACEUTICALS:  21.11 mCi Tc-959mertechnetate in-vitro labeled red cells. COMPARISON:  CT abdomen and pelvis 05/22/2022 FINDINGS: Anterior planar images of the abdomen were obtained for 2 hours after the intravenous administration of radiotracer. Evaluation is slightly limited by patient motion throughout the exam. There are no areas of abnormal radiotracer accumulation or propagation to suggest active gastrointestinal bleeding. Minimal activity is seen within the kidneys and bladder. IMPRESSION: 1. No evidence of active gastrointestinal bleeding. Electronically Signed   By: MiRanda Ngo.D.   On: 05/29/2022 16:56   DG Chest Port 1 View  Result Date: 05/27/2022 CLINICAL DATA:  100028 leukocytosis EXAM: PORTABLE CHEST 1 VIEW COMPARISON:  October 30, 2021 FINDINGS: The cardiomediastinal  silhouette is unchanged and enlarged in contour. No pleural effusion. No pneumothorax. Peribronchial cuffing with diffuse interstitial prominence the LEFT perihilar vascular fullness. Gaseous distension of colon. Spinal stimulator. IMPRESSION: Constellation of findings are favored to reflect mild pulmonary edema. Electronically Signed   By: StValentino Saxon.D.   On: 05/27/2022 13:58   CT LUMBAR SPINE W CONTRAST  Result Date: 05/26/2022 CLINICAL DATA:  Low back pain, infection suspected concern for epidural abscess EXAM: CT LUMBAR SPINE WITH CONTRAST TECHNIQUE: Multidetector CT imaging of the lumbar spine was performed with intravenous contrast administration. RADIATION DOSE REDUCTION: This exam was performed according to the departmental dose-optimization program which includes automated exposure control, adjustment of the mA and/or kV according to patient size and/or use of iterative reconstruction technique. CONTRAST:  10066mMNIPAQUE IOHEXOL 300 MG/ML  SOLN COMPARISON:  CT lumbar spine without contrast 05/22/2022 FINDINGS: Segmentation: 5 lumbar-type vertebral bodies. Pseudoarticulation of the elongated left L5 transverse process with the sacral ala, with degenerative changes in both the transverse process and sacrum in this location. Alignment: Trace retrolisthesis of L3 on L4 and L4 on L5. Mild dextrocurvature of the thoracolumbar junction and compensatory levocurvature. Vertebrae: Unchanged bilateral pars defects at L5 with additional fracture involving the left pedicle. Vertebral body heights are otherwise largely preserved. Significant endplate degenerative sclerosis, which is asymmetric to the left at L1-L2 and to the right at L2-L5. Degenerative changes appear overall unchanged from both 05/22/2022 and 10/30/2021 CT abdomen pelvis. Paraspinal and other soft tissues: Redemonstrated infrarenal abdominal aortic aneurysm status post endovascular repair. The aneurysm sac measures up to 4.4 cm, unchanged  when remeasured similarly. Cholelithiasis. Right extrarenal pelvis. No abnormal enhancement in the psoas muscles to suggest abscess. No enhancing epidural collection. Disc levels: T11-T12: Disc height loss. Moderate facet arthropathy. Moderate spinal canal stenosis. Severe right and moderate left neural foraminal narrowing. T12-L1: Disc height loss. Moderate disc bulge. Mild facet arthropathy. Moderate spinal canal stenosis. Mild bilateral neural foraminal narrowing. L1-L2: Disc height loss and moderate disc bulge. Mild facet arthropathy. Ligamentum flavum hypertrophy. Severe spinal canal stenosis. Severe left and moderate right neural foraminal narrowing. L2-L3: Disc height loss with disc osteophyte complex. Moderate right and mild left facet arthropathy. Ligamentum flavum hypertrophy. Severe spinal canal stenosis. No significant neural foraminal narrowing. L3-L4: Trace retrolisthesis. Disc height loss and  moderate disc bulge. Moderate facet arthropathy. Severe spinal canal stenosis. Severe right neural foraminal narrowing. L4-L5: Trace retrolisthesis and mild disc bulge. Severe facet arthropathy. Moderate spinal canal stenosis. Moderate bilateral neural foraminal narrowing. L5-S1: Disc height loss and mild disc bulge. Moderate to severe facet arthropathy. Fracture fragments project into the posterior aspect of the spinal canal posterior to the L5 vertebral body. No spinal canal stenosis at the disc level. Moderate right neural foraminal narrowing. IMPRESSION: 1. No evidence of discitis or osteomyelitis. Unchanged endplate degenerative changes, without evidence of epidural enhancement or psoas abscess. If there is persistent concern for infection, this could be better evaluated with an MRI with and without contrast. 2. Otherwise unchanged severe degenerative changes throughout the lumbar spine, with severe spinal canal stenosis and severe neural foraminal narrowing at multiple levels. Electronically Signed   By:  Merilyn Baba M.D.   On: 05/26/2022 19:25   ECHOCARDIOGRAM COMPLETE  Result Date: 05/26/2022    ECHOCARDIOGRAM REPORT   Patient Name:   Anthony [name] Date of Exam: 05/26/2022 Medical Rec #:  [id number]      Height:       72.0 in Accession #:    [id number]     Weight:       270.0 lb Date of Birth:  1936-07-30      BSA:          2.420 m Patient Age:    34 years       BP:           155/79 mmHg Patient Gender: M              HR:           102 bpm. Exam Location:  [facility] Procedure: 2D Echo, Color Doppler and Cardiac Doppler Indications:     R78.81 Bacteremia  History:         Patient has no prior history of Echocardiogram examinations.                  CAD; Risk Factors:Hypertension, Dyslipidemia and Sleep Apnea.  Sonographer:     Charmayne Sheer Referring Phys:  [id number] Tsosie Billing Diagnosing Phys: Ida Rogue MD  Sonographer Comments: Suboptimal parasternal window. Image acquisition challenging due to patient body habitus and Image acquisition challenging due to respiratory motion. IMPRESSIONS  1. Left ventricular ejection fraction, by estimation, is 55 %. The left ventricle has normal function. The left ventricle has no regional wall motion abnormalities. Left ventricular diastolic parameters are consistent with Grade I diastolic dysfunction (impaired relaxation).  2. Right ventricular systolic function is normal. The right ventricular size is normal. Tricuspid regurgitation signal is inadequate for assessing PA pressure.  3. The mitral valve is normal in structure. No evidence of mitral valve regurgitation. No evidence of mitral stenosis.  4. The aortic valve is normal in structure. Aortic valve regurgitation is not visualized. Aortic valve sclerosis is present, with no evidence of aortic valve stenosis.  5. The inferior vena cava is normal in size with greater than 50% respiratory variability, suggesting right atrial pressure of 3 mmHg. FINDINGS  Left Ventricle: Left ventricular ejection fraction, by  estimation, is 55 %. The left ventricle has normal function. The left ventricle has no regional wall motion abnormalities. The left ventricular internal cavity size was normal in size. There is no left ventricular hypertrophy. Left ventricular diastolic parameters are consistent with Grade I diastolic dysfunction (impaired relaxation). Right Ventricle: The right ventricular size is normal. No increase in right  ventricular wall thickness. Right ventricular systolic function is normal. Tricuspid regurgitation signal is inadequate for assessing PA pressure. Left Atrium: Left atrial size was normal in size. Right Atrium: Right atrial size was normal in size. Pericardium: There is no evidence of pericardial effusion. Mitral Valve: The mitral valve is normal in structure. No evidence of mitral valve regurgitation. No evidence of mitral valve stenosis. There is no evidence of mitral valve vegetation. Tricuspid Valve: The tricuspid valve is normal in structure. Tricuspid valve regurgitation is not demonstrated. No evidence of tricuspid stenosis. There is no evidence of tricuspid valve vegetation. Aortic Valve: The aortic valve is normal in structure. Aortic valve regurgitation is not visualized. Aortic valve sclerosis is present, with no evidence of aortic valve stenosis. Aortic valve mean gradient measures 3.0 mmHg. Aortic valve peak gradient measures 6.6 mmHg. Aortic valve area, by VTI measures 3.41 cm. There is no evidence of aortic valve vegetation. Pulmonic Valve: The pulmonic valve was normal in structure. Pulmonic valve regurgitation is not visualized. No evidence of pulmonic stenosis. There is no evidence of pulmonic valve vegetation. Aorta: The aortic root is normal in size and structure. Venous: The inferior vena cava is normal in size with greater than 50% respiratory variability, suggesting right atrial pressure of 3 mmHg. IAS/Shunts: No atrial level shunt detected by color flow Doppler.  LEFT VENTRICLE PLAX 2D  LVIDd:         5.60 cm   Diastology LVIDs:         3.90 cm   LV e' medial:    11.50 cm/s LV PW:         1.20 cm   LV E/e' medial:  6.6 LV IVS:        1.10 cm   LV e' lateral:   16.40 cm/s LVOT diam:     2.50 cm   LV E/e' lateral: 4.6 LV SV:         73 LV SV Index:   30 LVOT Area:     4.91 cm  RIGHT VENTRICLE RV Basal diam:  4.50 cm LEFT ATRIUM             Index        RIGHT ATRIUM           Index LA diam:        5.30 cm 2.19 cm/m   RA Area:     15.00 cm LA Vol (A2C):   33.9 ml 14.01 ml/m  RA Volume:   31.00 ml  12.81 ml/m LA Vol (A4C):   47.0 ml 19.42 ml/m LA Biplane Vol: 43.7 ml 18.05 ml/m  AORTIC VALVE                    PULMONIC VALVE AV Area (Vmax):    3.64 cm     PV Vmax:       0.57 m/s AV Area (Vmean):   3.88 cm     PV Peak grad:  1.3 mmHg AV Area (VTI):     3.41 cm AV Vmax:           128.00 cm/s AV Vmean:          85.000 cm/s AV VTI:            0.213 m AV Peak Grad:      6.6 mmHg AV Mean Grad:      3.0 mmHg LVOT Vmax:         94.90 cm/s LVOT Vmean:  67.100 cm/s LVOT VTI:          0.148 m LVOT/AV VTI ratio: 0.69  AORTA Ao Root diam: 3.50 cm MITRAL VALVE MV Area (PHT): 4.21 cm     SHUNTS MV Decel Time: 180 msec     Systemic VTI:  0.15 m MV E velocity: 75.50 cm/s   Systemic Diam: 2.50 cm MV A velocity: 105.00 cm/s MV E/A ratio:  0.72 Ida Rogue MD Electronically signed by Ida Rogue MD Signature Date/Time: 05/26/2022/4:51:37 PM    Final    CT L-SPINE NO CHARGE  Result Date: 05/22/2022 CLINICAL DATA:  Fall EXAM: CT LUMBAR SPINE WITHOUT CONTRAST TECHNIQUE: Multidetector CT imaging of the lumbar spine was performed without intravenous contrast administration. Multiplanar CT image reconstructions were also generated. RADIATION DOSE REDUCTION: This exam was performed according to the departmental dose-optimization program which includes automated exposure control, adjustment of the mA and/or kV according to patient size and/or use of iterative reconstruction technique. COMPARISON:  CT  abdomen and pelvis 10/30/2021 FINDINGS: Segmentation: 5 lumbar type vertebrae. Alignment: Trace retrolisthesis of L3 on L4 and L4 on L5. Vertebrae: Bilateral pars defects at L5 with an additional fracture involving the left pedicle. These findings are unchanged from prior CT abdomen and pelvis from March. Vertebral body heights are otherwise preserved multilevel degenerative changes and vacuum disc phenomenon. Paraspinal and other soft tissues: Redemonstrated is an infrarenal abdominal aortic aneurysm status post endovascular aneurysm repair. The sizes excluded aneurysmal sac measures up to 4.4 cm, previously up to 4.2 cm. Cholelithiasis. Extrarenal pelvis on the right hypodense lesion at the lower pole of the right kidney is incompletely assessed in the absence of IV contrast. Delayed urinary bladder is markedly distended. Disc levels: T11-T12: Moderate disc space loss. Moderate spinal canal stenosis. Severe bilateral neuroforaminal stenosis. T12-L1: Moderate spinal canal stenosis. Mild bilateral facet degenerative change. Circumferential disc bulge. L1-L2: Ligamentum flavum hypertrophy. Moderate disc space loss. Mild bilateral facet degenerative changes. Circumferential disc bulge. Severe spinal canal stenosis. Severe left and moderate right neural foraminal stenosis. L2-L3: Ligamentum flavum hypertrophy. Moderate disc space loss. Mild bilateral facet degenerative changes. Circumferential disc bulge. Severe spinal canal stenosis. L3-L4: Ligamentum flavum hypertrophy. Moderate disc space loss. Moderate bilateral facet degenerative changes. Circumferential disc bulge. Severe spinal canal stenosis. Severe right and moderate left neural foraminal stenosis. L4-L5: Ligamentum flavum hypertrophy. Moderate disc space loss. Severe bilateral facet degenerative changes. Circumferential disc bulge. Severe spinal canal stenosis. Severe bilateral neural foraminal stenosis. L5-S1: Ligamentum flavum hypertrophy. Fracture  fragments project into the posterior aspect of the spinal canal at this level. Moderate disc space loss. Severe bilateral facet degenerative changes. Circumferential disc bulge. Severe spinal canal stenosis. Severe bilateral neural foraminal stenosis. IMPRESSION: 1.  No evidence of acute osseous trauma in the lumbar spine. 2. Multilevel severe degenerative changes throughout the lumbar spine with severe spinal canal stenosis from the L1-L5 level and severe bilateral neuroforaminal stenosis from the L3-S1 level. Consider further evaluation with a dedicated lumbar spine MRI for bettera characterization of the degree of spinal canal stenosis, if clinically warranted. 3. Chronic bilateral pars defects at L5, as well as a chronic fracture through the left pedicle. Electronically Signed   By: Marin Roberts M.D.   On: 05/22/2022 16:57   CT ABDOMEN PELVIS WO CONTRAST  Result Date: 05/22/2022 CLINICAL DATA:  86 year old male with abdominal pain EXAM: CT ABDOMEN AND PELVIS WITHOUT CONTRAST TECHNIQUE: Multidetector CT imaging of the abdomen and pelvis was performed following the standard protocol without IV contrast. RADIATION  DOSE REDUCTION: This exam was performed according to the departmental dose-optimization program which includes automated exposure control, adjustment of the mA and/or kV according to patient size and/or use of iterative reconstruction technique. COMPARISON:  10/30/2021, 04/09/2020 FINDINGS: Lower chest: No acute finding of the lower chest Hepatobiliary: Low-density cyst of the caudate, 15 mm, most likely benign biliary cyst. Cholelithiasis. No inflammatory changes. Pancreas: Unremarkable Spleen: Unremarkable Adrenals/Urinary Tract: - Right adrenal gland:  Unremarkable - Left adrenal gland: Unremarkable. - Right kidney: No hydronephrosis, nephrolithiasis, inflammation, or ureteral dilation. Multiple renal cysts/lesions which are not completely characterized on the current CT given the absence of  contrast. Largest at the inferior cortex measures 4.3 cm, unchanged. - Left Kidney: No hydronephrosis, nephrolithiasis, inflammation, or ureteral dilation. Renal lesion on the lateral cortex, incompletely characterized, representing a cyst on prior CT. - Urinary Bladder: Urinary bladder partially distended. Stomach/Bowel: - Stomach: Unremarkable. - Small bowel: Unremarkable - Appendix: Normal. - Colon: Mild to moderate stool burden. No evidence of obstruction. Left-sided diverticular disease. No inflammatory changes. Vascular/Lymphatic: Redemonstration of EVAR. Diameter of the excluded aneurysm sac estimated 4.5 cm on this noncontrast CT, which is essentially unchanged from the prior CT of 10/30/2021. No inflammatory changes. Mild atherosclerosis.  Atherosclerosis of the iliac arteries. No lymphadenopathy. Reproductive: Transverse diameter of the prostate estimated 4.9 cm Other: Generator within the posterior soft tissues of the left gluteal region. Leads terminate in the posterior canal in the lower thoracic region. Musculoskeletal: Degenerative changes of the thoracolumbar spine. Bilateral L5 pars defect. Similar appearance calcified spurring associated with the facets at the L5 level resulting in canal narrowing approximately 50%. Vacuum disc phenomenon present throughout all levels of the lumbar spine. No displaced fracture. IMPRESSION: CT negative for acute finding that would account for abdominal pain. Urinary bladder partially distended with prostatomegaly, potentially representing chronic bladder outlet obstruction. Aortic Atherosclerosis (ICD10-I70.0). Additional ancillary findings as above. Electronically Signed   By: Corrie Mckusick D.O.   On: 05/22/2022 15:17   DG PAIN CLINIC C-ARM 1-60 MIN NO REPORT  Result Date: 05/17/2022 Fluoro was used, but no Radiologist interpretation will be provided. Please refer to "NOTES" tab for provider progress note.   Microbiology: Recent Results (from the past 240  hour(s))  Aerobic/Anaerobic Culture w Gram Stain (surgical/deep wound)     Status: None (Preliminary result)   Collection Time: 06/05/22  3:25 PM   Specimen: PATH Other; Tissue  Result Value Ref Range Status   Specimen Description   Final    WOUND Performed at [facility], [address] [place], [place] [address]    Special Requests EPIDURAL FLUID  Final   Gram Stain   Final    RARE WBC PRESENT, PREDOMINANTLY PMN NO ORGANISMS SEEN    Culture   Final    NO GROWTH 3 DAYS NO ANAEROBES ISOLATED; CULTURE IN PROGRESS FOR 5 DAYS Performed at [facility] [address] [place], [place] [address]    Report Status PENDING  Incomplete  Aerobic/Anaerobic Culture w Gram Stain (surgical/deep wound)     Status: None (Preliminary result)   Collection Time: 06/05/22  3:50 PM   Specimen: PATH Other; Tissue  Result Value Ref Range Status   Specimen Description   Final    WOUND Performed at [facility], [address] [place], [place] [address]    Special Requests EPIDURAL FLUID  Final   Gram Stain   Final    RARE WBC PRESENT, PREDOMINANTLY PMN NO ORGANISMS SEEN  Culture   Final    NO GROWTH 3 DAYS NO ANAEROBES ISOLATED; CULTURE IN PROGRESS FOR 5 DAYS Performed at [facility], [place] [address] [place], [place] [address]    Report Status PENDING  Incomplete     Labs: Basic Metabolic Panel: Recent Labs  Lab 06/04/22 0630 06/05/22 0452 06/06/22 0432 06/07/22 0526 06/08/22 0522  NA 131* 135 134* 135 135  K 4.5 4.4 4.7 4.3 3.9  CL 95* 97* 97* 96* 97*  CO2 _0 GLUCOSE 146* 118* 159* 119* 153*  BUN 26* 26* 28* 29* 24*  CREATININE 1.32* 1.29* 1.21 1.28* 1.09  CALCIUM 10.0 9.8 9.7 10.0 9.9  MG  --   --  2.2  --   --    Liver Function Tests: No results for input(s): "AST", "ALT", "ALKPHOS", "BILITOT", "PROT", "ALBUMIN" in the last 168 hours. No results for input(s): "LIPASE", "AMYLASE" in the last 168 hours. No results for input(s):  "AMMONIA" in the last 168 hours. CBC: Recent Labs  Lab 06/04/22 0630 06/04/22 1750 06/05/22 0452 06/06/22 0432 06/07/22 0526 06/08/22 0522  WBC 14.5*  --  15.2* 15.7* 14.5* 14.2*  HGB 7.4* 8.1* 8.2* 8.1* 8.0* 8.2*  HCT 23.6* 25.5* 25.5* 25.4* 25.5* 25.6*  MCV 96.7  --  95.5 95.8 97.0 93.1  PLT 580*  --  635* 585* 575* 536*   Cardiac Enzymes: No results for input(s): "CKTOTAL", "CKMB", "CKMBINDEX", "TROPONINI" in the last 168 hours. BNP: BNP (last 3 results) No results for input(s): "BNP" in the last 8760 hours.  ProBNP (last 3 results) No results for input(s): "PROBNP" in the last 8760 hours.  CBG: Recent Labs  Lab 06/03/22 1013  GLUCAP 157*       Signed:  Desma Maxim MD.  [facility] 06/08/2022, 2:29 PM

## 2022-06-08 NOTE — Progress Notes (Signed)
PT Cancellation Note  Patient Details Name: Anthony [name] MRN: [id number] DOB: 1936/08/21   Cancelled Treatment:    Reason Eval/Treat Not Completed: Patient at procedure or test/unavailable. Will re-attempt later as time allows.    Floretta Petro 06/08/2022, 11:29 AM

## 2022-06-08 NOTE — Progress Notes (Addendum)
Spoke with Gena in admissions at [facility]. Facility will not have a male bed until 10/13. MD notified   Spoke with Gena at [facility]. Patient can be accepted today. Patient assigned to 708. Nurse will call report to [phone]. Son notified. EMS packet arranged. EMS scheduled. Discharge summary sent in [place]. TOC signing off.

## 2022-06-08 NOTE — Consult Note (Signed)
PHARMACY CONSULT NOTE FOR:  OUTPATIENT  PARENTERAL ANTIBIOTIC THERAPY (OPAT)  Indication: Staph capitis bacteremia + lumbar epidural abscess Regimen: Cefazolin 2g Q8H as IV push End date: 07/16/2022  IV antibiotic discharge orders are pended. To discharging provider:  please sign these orders via discharge navigator,  Select New Orders & click on the button choice - Manage This Unsigned Work.   Labs weekly every Monday while on IV antibiotics: CBC with differential, CMP, CRP, ESR Fax weekly lab results promptly to [phone]    Please pull PIC at completion of IV antibiotics  Thank you for allowing pharmacy to be a part of this patient's care.  Gretel Acre, PharmD PGY1 Pharmacy Resident 06/08/2022 10:20 AM

## 2022-06-08 NOTE — Progress Notes (Signed)
   Date of Admission:  05/22/2022      ID: Anthony [name] is a 86 y.o. male Principal Problem:   Sepsis ([place]) Active Problems:   Coag negative Staphylococcus bacteremia   Gastrointestinal hemorrhage with melena   Failed spinal cord stimulator (HCC)   Weakness   Abscess in epidural space of lumbar spine   Intraspinal abscess and granuloma   Discitis of lumbosacral region   Vertebral osteomyelitis (HCC)    Subjective: Pt is not as active like yesterday In the bed Says he had PICC line placement    Medications:   acetaminophen  1,000 mg Oral Q8H   amLODipine  5 mg Oral Daily   aspirin EC  81 mg Oral Daily   Chlorhexidine Gluconate Cloth  6 each Topical Q0600   ezetimibe  10 mg Oral Daily   feeding supplement  237 mL Oral TID BM   finasteride  5 mg Oral Daily   furosemide  40 mg Oral Daily   gabapentin  600 mg Oral QHS   ketoconazole   Topical QHS   ketoconazole   Topical Once per day on Mon Thu   levothyroxine  175 mcg Oral Q0600   linaclotide  290 mcg Oral QAC breakfast   pantoprazole (PROTONIX) IV  40 mg Intravenous Q12H   sodium chloride flush  10-40 mL Intracatheter Q12H   tamsulosin  0.4 mg Oral Daily    Objective: Vital signs in last 24 hours: Temp:  [98.2 F (36.8 C)-99.2 F (37.3 C)] 99.2 F (37.3 C) (10/12 1608) Pulse Rate:  [100-109] 109 (10/12 1608) Resp:  [17-18] 17 (10/12 1608) BP: (125-136)/(64-82) 136/64 (10/12 1608) SpO2:  [94 %-96 %] 96 % (10/12 1608)  PHYSICAL EXAM:  General: awake, tired  Lungs: b/l air entry- decreased in the bases Heart: Regular rate and rhythm, no murmur, rub or gallop. Abdomen: Soft, non-tender,not distended. Bowel sounds normal. No masses Extremities: rt PICC Skin: No rashes or lesions. Or bruising Lymph: Cervical, supraclavicular normal. Neurologic: left lower extremity weakness  Lab Results Recent Labs    06/07/22 0526 06/08/22 0522  WBC 14.5* 14.2*  HGB 8.0* 8.2*  HCT 25.5* 25.6*  NA 135 135  K 4.3 3.9   CL 96* 97*  CO2 31 30  BUN 29* 24*  CREATININE 1.28* 1.09   Liver Panel No results for input(s): "PROT", "ALBUMIN", "AST", "ALT", "ALKPHOS", "BILITOT", "BILIDIR", "IBILI" in the last 72 hours.      Assessment/Plan: Staph  capitis bacteremia Epidural abscessLow back pain with radiation down both legs Left leg more weak than right Has underlying degenerative spine disease and after a recent LESI the pain is worse Leucocytosis Osteomyelitiis/discitis/ L5-S1 epidural abscess/phlegmon L1-2- sacrum B/l facet septic arthritis L4-L5-  S/p laminectomy of L2-L3 and L5-S1 on 06/05/22 Spinal stimulator removal on 06/03/22 Continue cefazolin for 6-8 weeks 07/16/22, will need PO antibiotic after that    AKI on CKD- combination of infection, NSAID use, dehydration and BPH causing retention- resolved post void bladder scan had 900cc of urine and staright cath was 1600cc Has foley cath    GI bleed- underwent endoscopy- 2 non bleeding duodenal ulcer, and 1 non bleeding gastric ulcer seen On PPI    Hypothyrodism on synthorid   CAD s/p stent B/l TKA   H/o endovascular stent for aneurysm    Discussed with patient  Will follow him as OP

## 2022-06-09 ENCOUNTER — Telehealth: Payer: Self-pay

## 2022-06-09 DIAGNOSIS — R339 Retention of urine, unspecified: Secondary | ICD-10-CM | POA: Diagnosis not present

## 2022-06-09 DIAGNOSIS — M6281 Muscle weakness (generalized): Secondary | ICD-10-CM | POA: Diagnosis not present

## 2022-06-09 DIAGNOSIS — K922 Gastrointestinal hemorrhage, unspecified: Secondary | ICD-10-CM | POA: Diagnosis not present

## 2022-06-09 DIAGNOSIS — G061 Intraspinal abscess and granuloma: Secondary | ICD-10-CM | POA: Diagnosis not present

## 2022-06-09 NOTE — Telephone Encounter (Signed)
Faith, NP with [facility] called to clarify OPAT order for the patient. I provided Faith with opat orders per Dr. Gwenevere Ghazi note. Faith verbalized understanding.  Bryony Kaman T Brooks Sailors

## 2022-06-10 ENCOUNTER — Encounter: Payer: Self-pay | Admitting: Family Medicine

## 2022-06-10 LAB — AEROBIC/ANAEROBIC CULTURE W GRAM STAIN (SURGICAL/DEEP WOUND)
Culture: NO GROWTH
Culture: NO GROWTH

## 2022-06-13 ENCOUNTER — Telehealth: Payer: Medicare Other | Admitting: Student in an Organized Health Care Education/Training Program

## 2022-06-14 ENCOUNTER — Other Ambulatory Visit: Payer: Self-pay | Admitting: *Deleted

## 2022-06-14 DIAGNOSIS — R770 Abnormality of albumin: Secondary | ICD-10-CM | POA: Diagnosis not present

## 2022-06-14 DIAGNOSIS — E871 Hypo-osmolality and hyponatremia: Secondary | ICD-10-CM | POA: Diagnosis not present

## 2022-06-14 DIAGNOSIS — G061 Intraspinal abscess and granuloma: Secondary | ICD-10-CM | POA: Diagnosis not present

## 2022-06-14 NOTE — Patient Outreach (Signed)
Per [facility] Mr. Starner resides in [facility]. Screening for potential [facility] care coordination needs as benefit of insurance plan and PCP.   Will follow for transition plans and potential [facility] needs while Mr. Pouliot resides in SNF.   Marthenia Rolling, MSN, RN,BSN [facility] Coordinator [phone] (Direct dial)

## 2022-06-15 DIAGNOSIS — E871 Hypo-osmolality and hyponatremia: Secondary | ICD-10-CM | POA: Diagnosis not present

## 2022-06-15 DIAGNOSIS — G061 Intraspinal abscess and granuloma: Secondary | ICD-10-CM | POA: Diagnosis not present

## 2022-06-15 DIAGNOSIS — R339 Retention of urine, unspecified: Secondary | ICD-10-CM | POA: Diagnosis not present

## 2022-06-15 DIAGNOSIS — D649 Anemia, unspecified: Secondary | ICD-10-CM | POA: Diagnosis not present

## 2022-06-19 DIAGNOSIS — R339 Retention of urine, unspecified: Secondary | ICD-10-CM | POA: Diagnosis not present

## 2022-06-19 DIAGNOSIS — E871 Hypo-osmolality and hyponatremia: Secondary | ICD-10-CM | POA: Diagnosis not present

## 2022-06-19 DIAGNOSIS — D649 Anemia, unspecified: Secondary | ICD-10-CM | POA: Diagnosis not present

## 2022-06-19 DIAGNOSIS — G061 Intraspinal abscess and granuloma: Secondary | ICD-10-CM | POA: Diagnosis not present

## 2022-06-21 ENCOUNTER — Ambulatory Visit
Payer: Medicare Other | Attending: Student in an Organized Health Care Education/Training Program | Admitting: Student in an Organized Health Care Education/Training Program

## 2022-06-21 DIAGNOSIS — G8929 Other chronic pain: Secondary | ICD-10-CM

## 2022-06-21 DIAGNOSIS — M5416 Radiculopathy, lumbar region: Secondary | ICD-10-CM

## 2022-06-23 DIAGNOSIS — G061 Intraspinal abscess and granuloma: Secondary | ICD-10-CM | POA: Diagnosis not present

## 2022-06-26 ENCOUNTER — Encounter (INDEPENDENT_AMBULATORY_CARE_PROVIDER_SITE_OTHER): Payer: Self-pay

## 2022-06-26 ENCOUNTER — Telehealth: Payer: Self-pay

## 2022-06-26 NOTE — Telephone Encounter (Signed)
Pt son called and wanted  to know if the the nurse home can remove  his cath. Because it easier on the patient. His son feel like would better because he is large guy and getting him here at time of morning is to much on the patient, he is willing to being him in for his afternoon appt for his v/t .  He said if you any question can call him. [phone]

## 2022-06-26 NOTE — Telephone Encounter (Signed)
If that is the case, he needs to be rescheduled to another day.  Require new patients to be seen by physician that he is scheduled for me in the morning.  I do not have an opening in the afternoon.  Please reschedule for another day when I am available in the afternoon.  Alternatively, I generally do with skull to fill and pull where I feel the bladder before removing the catheter.  If you void successfully, you do not need to return in the afternoon for the appointment.  If the facility that he is at can replace the catheter, that is even better.  Hollice Espy, MD

## 2022-06-26 NOTE — Telephone Encounter (Signed)
Don aware that per Dr. Erlene Quan pt needs to see MD first then can see pa. Offered to r/s as Erlene Quan does not have a pm appt. Timmothy Sours states he will need to speak to [name] and let us know. He is aware that pts transportation for the am appt has been cx.   Timmothy Sours will call back tomorrow morning or send my chart message with next steps.

## 2022-06-26 NOTE — Telephone Encounter (Signed)
Called and gave him the message, pt son said that it just hard get his father here for 2 appt. Because his father has a lot of back pain will not tolerate a lot movement. Also he feels that  the times at is not appropriate time frame for him to do a v/t. I offer to move the patient to a different day or work out a different time however he refused and wanted to speck w/ Dr Erlene Quan. To express his concerns. 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

## 2022-06-27 ENCOUNTER — Encounter: Payer: Self-pay | Admitting: Urology

## 2022-06-27 ENCOUNTER — Ambulatory Visit: Payer: Medicare Other | Admitting: Physician Assistant

## 2022-06-27 ENCOUNTER — Ambulatory Visit (INDEPENDENT_AMBULATORY_CARE_PROVIDER_SITE_OTHER): Payer: Medicare Other | Admitting: Urology

## 2022-06-27 VITALS — BP 110/75 | HR 112 | Ht 72.0 in | Wt 240.0 lb

## 2022-06-27 DIAGNOSIS — R339 Retention of urine, unspecified: Secondary | ICD-10-CM | POA: Diagnosis not present

## 2022-06-27 DIAGNOSIS — N401 Enlarged prostate with lower urinary tract symptoms: Secondary | ICD-10-CM | POA: Diagnosis not present

## 2022-06-27 DIAGNOSIS — I2510 Atherosclerotic heart disease of native coronary artery without angina pectoris: Secondary | ICD-10-CM | POA: Diagnosis not present

## 2022-06-27 DIAGNOSIS — N138 Other obstructive and reflux uropathy: Secondary | ICD-10-CM

## 2022-06-27 DIAGNOSIS — E871 Hypo-osmolality and hyponatremia: Secondary | ICD-10-CM | POA: Diagnosis not present

## 2022-06-27 DIAGNOSIS — D649 Anemia, unspecified: Secondary | ICD-10-CM | POA: Diagnosis not present

## 2022-06-27 DIAGNOSIS — G061 Intraspinal abscess and granuloma: Secondary | ICD-10-CM | POA: Diagnosis not present

## 2022-06-27 NOTE — Progress Notes (Signed)
06/27/2022 1:14 PM   Anthony [name] [birth year] [id number]  Referring provider: Ria Bush, MD [place],  [place] [address]  Chief Complaint  Patient presents with   New Patient (Initial Visit)   Urinary Retention    HPI: 86 year old male with personal history of urinary retention who presents today for voiding trial.  He was recently hospitalized and ultimately discharged on 06/09/2023 with sepsis from coag negative staph bacteremia, gastrointestinal hemorrhage and epidural abscess.  During the admission, he was found to be in urinary retention with elevated creatinine to 2.62.  He had a failed voiding trial during his admission and ultimately was discharged with Foley catheter in place with outpatient follow-up 2 weeks later.  Creatinine at the time of discharge had normalized to 1.09.  CT of the abdomen pelvis performed on 05/22/2022 showed no hydronephrosis.  Prostamegaly was noted.  No evidence of thick-walled bladder or evidence of outlet obstruction radiographically.  I personally reviewed these images today.  Last PSA 0.5 on 10/2014.    [facility] at [facility].  He is not ambulatory but is able to stand with assistance and transfer.  He is weak lower extremities.  He is on finasteride and Flomax chronically.  He denies being followed by a urologist in the past.  He denies a history of urinary retention but after some probing does admit to having urinary frequency and incontinence a few hours after Foley catheter was removed in the past.  It is unclear the exact nature of this catheter or when this happened.  His son is a retired Dealer from [employer].   PMH: Past Medical History:  Diagnosis Date   Anosmia 1980s   Atrial flutter ([place]) 2018   had an ablation with dr. Erick Alley   Basal cell carcinoma 01/11/2022   L cheek - excised 04/25/22   CAD (coronary artery disease)    Cancer (HCC)    skin cancer   Chronic constipation     Chronic insomnia    Chronic lower back pain    s/p spine stimulator placement   Complication of anesthesia    when under general, he woke up agitated and unable to be held down   Diastolic CHF ([place])    History of diverticulitis 2017   History of pneumonia 2013   Hyperlipidemia    Hypertension    Hypothyroidism    Obesity, Class II, BMI 35-39.9, with comorbidity    OSA on CPAP    PONV (postoperative nausea and vomiting)     Surgical History: Past Surgical History:  Procedure Laterality Date   A-FLUTTER ABLATION N/A 05/07/2017   Procedure: A-Flutter Ablation;  Surgeon: Deboraha Sprang, MD;  Location: [place] [facility];  Service: Cardiovascular;  Laterality: N/A;   CARDIOVASCULAR STRESS TEST  11/2017   no ischemia, low risk study   COLONOSCOPY  2007   normal per prior PCP records, rpt 10 yrs (Dr Osie Cheeks)   [place] Right    ulnar nerve decompression.  PT DOES NOT RECALL THIS PROCEDURE   ENDOVASCULAR REPAIR/STENT GRAFT N/A 05/05/2020   Procedure: ENDOVASCULAR REPAIR/STENT GRAFT;  Surgeon: Algernon Huxley, MD;  Location: [place] [facility];  Service: Cardiovascular;  Laterality: N/A;   ESOPHAGOGASTRODUODENOSCOPY (EGD) WITH PROPOFOL N/A 05/30/2022   Procedure: ESOPHAGOGASTRODUODENOSCOPY (EGD) WITH PROPOFOL;  Surgeon: Jonathon Bellows, MD;  Location: [facility] ENDOSCOPY;  Service: Gastroenterology;  Laterality: N/A;   LAMINECTOMY THORACIC SPINE W/ PLACEMENT SPINAL CORD STIMULATOR  08/2018  and removal of prior spine stimulator (Dr Lacinda Axon)   LUMBAR LAMINECTOMY FOR EPIDURAL ABSCESS Bilateral 06/05/2022   Procedure: LUMBAR 2- L3, L5-S1 LAMINECTOMY FOR EPIDURAL ABSCESS;  Surgeon: Meade Maw, MD;  Location: [facility] ORS;  Service: Neurosurgery;  Laterality: Bilateral;   NASAL SINUS SURGERY     nasal polyps. done a long time ago   [place] (PCI-S)  2013   EF55%, 70% mid LAD, 99% mid RCA, mild MR, elev LVEDP, DES to mid LAD. RCA is nondominant   RADIOFREQUENCY  ABLATION  06/2017   lumbar region [facility])    REPLACEMENT TOTAL KNEE BILATERAL Bilateral 2000s   SPINAL CORD STIMULATOR IMPLANT  06/2016   SPINAL CORD STIMULATOR INSERTION N/A 09/09/2018   Procedure: LUMBAR SPINAL CORD STIMULATOR LEAD AND BATTERY REMOVAL AND LAMINECTOMY FOR PLACEMENT OF PADDLE;  Surgeon: Deetta Perla, MD;  Location: [facility] ORS;  Service: Neurosurgery;  Laterality: N/A;   SPINAL CORD STIMULATOR INSERTION N/A 09/16/2018   Procedure: PLACEMENT OF SPINAL CORD STIMULATOR BATTERY;  Surgeon: Deetta Perla, MD;  Location: [facility] ORS;  Service: Neurosurgery;  Laterality: N/A;   SPINAL CORD STIMULATOR REMOVAL N/A 06/03/2022   Procedure: LUMBAR SPINAL CORD STIMULATOR REMOVAL;  Surgeon: Meade Maw, MD;  Location: [facility] ORS;  Service: Neurosurgery;  Laterality: N/A;    Home Medications:  Allergies as of 06/27/2022       Reactions   Questran [cholestyramine]    Patient not aware of an allergy to this medicine.   Atorvastatin    Muscle pain   Gemfibrozil Other (See Comments)   Muscle pain.   Metformin And Related    Dizziness   Rosuvastatin    Muscle pain        Medication List        Accurate as of June 27, 2022  1:14 PM. If you have any questions, ask your nurse or doctor.          STOP taking these medications    ondansetron 4 MG tablet Commonly known as: ZOFRAN Stopped by: Hollice Espy, MD   tiZANidine 2 MG tablet Commonly known as: ZANAFLEX Stopped by: Hollice Espy, MD   traZODone 50 MG tablet Commonly known as: DESYREL Stopped by: Hollice Espy, MD   Vitamin D3 25 MCG (1000 UT) Caps Stopped by: Hollice Espy, MD       TAKE these medications    acetaminophen 500 MG tablet Commonly known as: TYLENOL Take 1 tablet (500 mg total) by mouth 2 (two) times daily as needed for moderate pain.   amLODipine 5 MG tablet Commonly known as: NORVASC TAKE 1 TABLET(5 MG) BY MOUTH DAILY What changed: See the new instructions.   aspirin EC 81 MG  tablet Take 1 tablet (81 mg total) by mouth daily.   ciclopirox 8 % solution Commonly known as: PENLAC APPLY TOPICALLY AT BEDTIME APPLY OVER NAIL AND SURROUNDING SKIN, APPLY DAILY OVER PREVIOUS COAT, AFTER 7 DAYS MAY REMOVE WITH ALCOHOL AND REPEAT   clopidogrel 75 MG tablet Commonly known as: PLAVIX TAKE 1 TABLET(75 MG) BY MOUTH DAILY AT 6 AM   diphenhydramine-acetaminophen 25-500 MG Tabs tablet Commonly known as: TYLENOL PM Take 2 tablets by mouth at bedtime.   docusate sodium 100 MG capsule Commonly known as: COLACE Take 100 mg by mouth daily.   Eszopiclone 3 MG Tabs Take 1 tablet (3 mg total) by mouth at bedtime as needed. Use sparingly, take immediately before bedtime   ezetimibe 10 MG tablet Commonly known as: ZETIA Take 1 tablet (  10 mg total) by mouth daily.   finasteride 5 MG tablet Commonly known as: PROSCAR TAKE 1 TABLET(5 MG) BY MOUTH DAILY   fluocinonide 0.05 % external solution Commonly known as: LIDEX Apply 1 application topically 2 (two) times daily as needed. What changed:  when to take this reasons to take this   furosemide 40 MG tablet Commonly known as: LASIX TAKE 1 TABLET BY MOUTH EVERY DAY. TAKE AN EXTRA TABLET AS NEEDED FOR WEIGHT GAIN OF 3 LBS/DAY OR 5LBS/WEEK   gabapentin 300 MG capsule Commonly known as: NEURONTIN TAKE 2 CAPSULE BY MOUTH AT BEDTIME WITH EXTRA CAPSULE DURING THE DAY AS NEEDED   icosapent Ethyl 1 g capsule Commonly known as: VASCEPA Take 2 capsules (2 g total) by mouth 2 (two) times daily.   ketoconazole 2 % cream Commonly known as: NIZORAL APPLY TO FEET AND FACE AT BEDTIME AS DIRECTED   ketoconazole 2 % shampoo Commonly known as: NIZORAL APPLY TOPICALLY 3 TIMES WEEKLY AS DIRECTED   levothyroxine 175 MCG tablet Commonly known as: SYNTHROID Take 1 tablet (175 mcg total) by mouth daily before breakfast.   Linzess 290 MCG Caps capsule Generic drug: linaclotide TAKE 1 CAPSULE(290 MCG) BY MOUTH DAILY BEFORE BREAKFAST    Melatonin 10 MG Caps Take 10 mg by mouth at bedtime.   mometasone 0.1 % cream Commonly known as: ELOCON APPLY TOPICALLY TO THE AFFECTED AREA DAILY AS NEEDED FOR RASH   mometasone 0.1 % lotion Commonly known as: ELOCON APPLY TO SCALP AT BEDTIME ON MONDAY, WEDNESDAY, AND FRIDAY   mupirocin ointment 2 % Commonly known as: BACTROBAN Apply 1 Application topically daily. With dressing changes   nitroGLYCERIN 0.4 MG SL tablet Commonly known as: NITROSTAT Place 1 tablet (0.4 mg total) under the tongue every 5 (five) minutes as needed for chest pain.   oxyCODONE 5 MG immediate release tablet Commonly known as: Oxy IR/ROXICODONE Take 1 tablet (5 mg total) by mouth every 4 (four) hours as needed for moderate pain, severe pain or breakthrough pain.   pantoprazole 40 MG tablet Commonly known as: Protonix Take 1 tablet (40 mg total) by mouth 2 (two) times daily.   Praluent 75 MG/ML [place] Generic drug: Alirocumab Inject 1 pen. into the skin every 14 (fourteen) days.   PRESERVISION AREDS 2 PO Take 1 tablet by mouth daily.   CENTRUM SILVER PO Take 1 tablet by mouth daily.   senna 8.6 MG tablet Commonly known as: SENOKOT Take 1 tablet (8.6 mg total) by mouth daily.   tamsulosin 0.4 MG Caps capsule Commonly known as: FLOMAX Take 1 capsule (0.4 mg total) by mouth daily.        Allergies:  Allergies  Allergen Reactions   Questran [Cholestyramine]     Patient not aware of an allergy to this medicine.   Atorvastatin     Muscle pain   Gemfibrozil Other (See Comments)    Muscle pain.   Metformin And Related     Dizziness   Rosuvastatin     Muscle pain    Family History: Family History  Problem Relation Age of Onset   Stroke Mother    Kidney disease Father    Leukemia Brother    Stroke Sister    Diabetes Neg Hx     Social History:  reports that he has never smoked. He has never been exposed to tobacco smoke. He has never used smokeless tobacco. He reports that he does  not drink alcohol and does not use drugs.  Physical Exam: BP 110/75   Pulse (!) 112   Ht 6' (1.829 m)   Wt 240 lb (108.9 kg)   BMI 32.55 kg/m   Constitutional:  Alert and oriented, No acute distress.  In a wheelchair, accompanied by his son.  Foley catheter in place. HEENT: [place] AT, moist mucus membranes.  Trachea midline, no masses. Cardiovascular: No clubbing, cyanosis, or edema. Respiratory: Normal respiratory effort, no increased work of breathing. GU: Foley catheter in place draining clear yellow urine Skin: No rashes, bruises or suspicious lesions. Neurologic: Grossly intact, no focal deficits, moving all 4 extremities. Psychiatric: Normal mood and affect.  Laboratory Data: Lab Results  Component Value Date   WBC 14.2 (H) 06/08/2022   HGB 8.2 (L) 06/08/2022   HCT 25.6 (L) 06/08/2022   MCV 93.1 06/08/2022   PLT 536 (H) 06/08/2022    Lab Results  Component Value Date   CREATININE 1.09 06/08/2022  \ Lab Results  Component Value Date   HGBA1C 6.3 04/25/2022   CT scan reviewed as above   Assessment & Plan:    1. Urinary retention Mostly factorial in the setting of medical illness, immobility, neurological condition, age with underlying BPH, pharmacotherapy amongst others.  He is at minimum somewhat weightbearing at this point in time and does have some sensation in his bladder.  As such, we all agreed to proceed with a fill and pull voiding trial this morning.  He is at a facility currently where the Foley catheter could be easily replaced and orders were sent to do so if he has difficulty urinating or becomes uncomfortable this evening.  We will have him return in 2 weeks for PVR.  Continue Flomax and finasteride.   Single dose of Bactrim was given today, he is currently on IV vancomycin but will also cover for gram negatives for prophylaxis.  We discussed whether or not to pursue PSA screening, in light of his fairly normal CT scan age and comorbidities, we are  all are in agreement not to pursue this.  2. Benign prostatic hyperplasia with urinary obstruction As above   Return for 1-2 weeks with PVR with sam or shannon.  Hollice Espy, MD  [facility] [address] [place], [place] [address] [phone]

## 2022-06-27 NOTE — Telephone Encounter (Signed)
Pt seen today by Dr. Erlene Quan.

## 2022-06-27 NOTE — Progress Notes (Signed)
Fill and Pull Catheter Removal  Patient is present today for a catheter removal.  Patient was cleaned and prepped in a sterile fashion 337m of sterile water/ saline was instilled into the bladder when the patient felt the urge to urinate. 973mof water was then drained from the balloon.  A foley cath was removed from the bladder no complications were noted .  Patient as then given some time to void on their own.  Patient tolerated well.  Performed by: S.Glorine Hanratty, CMA/Tarsha Valentine  Follow up/ Additional notes: patient will follow up in 1-2 weeks

## 2022-06-27 NOTE — Progress Notes (Signed)
No appt needed

## 2022-06-29 ENCOUNTER — Encounter: Payer: Self-pay | Admitting: Infectious Diseases

## 2022-06-29 ENCOUNTER — Inpatient Hospital Stay: Payer: Medicare Other | Admitting: Infectious Diseases

## 2022-06-29 ENCOUNTER — Ambulatory Visit: Payer: Medicare Other | Attending: Infectious Diseases | Admitting: Infectious Diseases

## 2022-06-29 ENCOUNTER — Ambulatory Visit (INDEPENDENT_AMBULATORY_CARE_PROVIDER_SITE_OTHER): Payer: Medicare Other | Admitting: Neurosurgery

## 2022-06-29 ENCOUNTER — Encounter: Payer: Self-pay | Admitting: Neurosurgery

## 2022-06-29 ENCOUNTER — Ambulatory Visit: Payer: Medicare Other | Admitting: Internal Medicine

## 2022-06-29 VITALS — BP 100/67 | HR 99 | Temp 97.9°F

## 2022-06-29 DIAGNOSIS — G062 Extradural and subdural abscess, unspecified: Secondary | ICD-10-CM | POA: Diagnosis not present

## 2022-06-29 DIAGNOSIS — G061 Intraspinal abscess and granuloma: Secondary | ICD-10-CM | POA: Diagnosis not present

## 2022-06-29 DIAGNOSIS — I2510 Atherosclerotic heart disease of native coronary artery without angina pectoris: Secondary | ICD-10-CM | POA: Diagnosis not present

## 2022-06-29 DIAGNOSIS — E039 Hypothyroidism, unspecified: Secondary | ICD-10-CM | POA: Diagnosis not present

## 2022-06-29 DIAGNOSIS — M4626 Osteomyelitis of vertebra, lumbar region: Secondary | ICD-10-CM | POA: Diagnosis not present

## 2022-06-29 DIAGNOSIS — M4647 Discitis, unspecified, lumbosacral region: Secondary | ICD-10-CM

## 2022-06-29 DIAGNOSIS — Z79890 Hormone replacement therapy (postmenopausal): Secondary | ICD-10-CM | POA: Diagnosis not present

## 2022-06-29 DIAGNOSIS — N179 Acute kidney failure, unspecified: Secondary | ICD-10-CM | POA: Diagnosis not present

## 2022-06-29 DIAGNOSIS — Z955 Presence of coronary angioplasty implant and graft: Secondary | ICD-10-CM | POA: Insufficient documentation

## 2022-06-29 DIAGNOSIS — Z96653 Presence of artificial knee joint, bilateral: Secondary | ICD-10-CM | POA: Insufficient documentation

## 2022-06-29 DIAGNOSIS — R7881 Bacteremia: Secondary | ICD-10-CM | POA: Diagnosis not present

## 2022-06-29 DIAGNOSIS — Z792 Long term (current) use of antibiotics: Secondary | ICD-10-CM | POA: Diagnosis not present

## 2022-06-29 DIAGNOSIS — N189 Chronic kidney disease, unspecified: Secondary | ICD-10-CM | POA: Insufficient documentation

## 2022-06-29 DIAGNOSIS — Z09 Encounter for follow-up examination after completed treatment for conditions other than malignant neoplasm: Secondary | ICD-10-CM

## 2022-06-29 DIAGNOSIS — R339 Retention of urine, unspecified: Secondary | ICD-10-CM | POA: Insufficient documentation

## 2022-06-29 DIAGNOSIS — M4620 Osteomyelitis of vertebra, site unspecified: Secondary | ICD-10-CM | POA: Diagnosis not present

## 2022-06-29 DIAGNOSIS — M549 Dorsalgia, unspecified: Secondary | ICD-10-CM | POA: Insufficient documentation

## 2022-06-29 DIAGNOSIS — M5442 Lumbago with sciatica, left side: Secondary | ICD-10-CM

## 2022-06-29 DIAGNOSIS — M5441 Lumbago with sciatica, right side: Secondary | ICD-10-CM

## 2022-06-29 NOTE — Progress Notes (Signed)
   DOS: 06/05/2022 (lumbar lami for epidu for fit for him HISTORY OF PRESENT ILLNESS: 06/29/2022 [name] is status post removal of his spinal cord stimulator and two-level lumbar laminectomy for evacuation of epidural abscess.  He is currently in a skilled nursing facility.  He still has severe pain.  He has trouble walking due to leg and back pain.  He is currently on antibiotics.  He was unable to see his infectious disease specialist due to transportation issues..   PHYSICAL EXAMINATION:   Vitals:   06/29/22 0954  BP: 100/67  Pulse: 99  Temp: 97.9 F (36.6 C)   General: Patient is well developed, well nourished, calm, collected, and in no apparent distress.  NEUROLOGICAL:  General: In no acute distress.  Awake, alert, oriented to person, place, and time. Pupils equal round and reactive to light.   Strength:  Side Iliopsoas Quads Hamstring PF DF EHL  R '5 5 5 5 5 5  '$ L 4+ '5 5 5 4 5   '$ Incision c/d/i   ROS (Neurologic): Negative except as noted above  IMAGING: No interval imaging to review   ASSESSMENT/PLAN:  Anthony [name] is doing poorly after getting epidural abscess in the lumbar spine.  I would like to discuss his care with his infectious disease specialist to evaluate for need for additional imaging.  Unfortunate, I think he is going to struggle with pain in an ongoing fashion.    I spent a total of 10 minutes in face-to-face and non-face-to-face activities related to this patient's care today.   Meade Maw MD, [facility] Department of Neurosurgery

## 2022-06-29 NOTE — Progress Notes (Signed)
The purpose of this virtual visit is to provide medical care while limiting exposure to the novel coronavirus (COVID19) for both patient and office staff.   Consent was obtained for phone visit:  Yes.   Answered questions that patient had about telehealth interaction:  Yes.   I discussed the limitations, risks, security and privacy concerns of performing an evaluation and management service by telephone. I also discussed with the patient that there may be a patient responsible charge related to this service. The patient expressed understanding and agreed to proceed.   [facility] Location: [facility] Provider Location: office People on the call- patient, his son Dr.Willaims and provider and CMA  Follow-up visit to recent hospitalization Patient was in [facility] between 05/22/2022 until 06/08/2022 He had coag negative Staphylococcus bacteremia due to staph  capitis Severe lumbar spine pain which turned out to epidural abscess/phlegmon and L1-L2 to sacrum Bilateral facet septic arthritis of L4-L5 Osteomyelitis/discitis of L5-S1.  He underwent laminectomy of L2-L3 and L5-S1 on 06/05/2022.  He also had spinal stimulator removal on 06/03/2022.  He had GI bleed and underwent an endoscopy which showed 2 nonbleeding duodenal ulcer and 1 nonbleeding gastric ulcer.  Started on PPI He continues to have pain even though he has been slowly improving.Marland Kitchen  He is able to sit in chair and had walked up to 20 feet with a walker.  But after that he started having severe pain which limited his mobility. He does not have any fever No rash or diarrhea Appetite is borderline He has urinary retention. The Foley catheter was initially removed at SNF but had to be replaced He is also constipated  No clinical examination for this visit  Impression/recommendation  Staph capitis bacteremia Lumbar epidural abscess, osteomyelitis, discitis, septic facet joint arthritis status post laminectomy. He is currently on cefazolin 2 g IV  Q8 for 6 weeks which he will finish on 07/16/2022.  After that we will give him Keflex 1 g p.o. every 6 If he decides to go home earlier than 07/16/2022 we can discontinue the  IV antibiotics, remove PICC  and start him on Keflex 1 gram Po q6 may be for 4 to 8 weeks depending on the ESR, CRP and his back pain. His son is a physician and he will take care of that  Ongoing back pain.  Even though slightly improved he is still has pain that limits his active participation in PT He is on oxycodone 10 mg every 4 hours Could try lidocaine patch Lumbar brace has not helped him Would hold off on MRI currently because of recent surgery and it may not show much improvement.  May consider MRI in a month or so.  Urinary retention Has Foley catheter  AKI on CKD  GI bleed underwent endoscopy had to ulcers in the duodenal area and 1 nonbleeding gastric ulcer.  On PPI  History of CAD status post stent  History of bilateral TKA  History of endovascular stent for aneurysm  Hypothyroidism on Synthroid  Discussed the management with the son and the patient in great detail After the visit [facility] for labs as well as lidocaine patch  Follow-up in 4-weeks Total time spent on the entire call and follow-up was 25 minutes

## 2022-07-04 ENCOUNTER — Inpatient Hospital Stay: Payer: Medicare Other | Admitting: Internal Medicine

## 2022-07-04 DIAGNOSIS — G061 Intraspinal abscess and granuloma: Secondary | ICD-10-CM | POA: Diagnosis not present

## 2022-07-04 DIAGNOSIS — R339 Retention of urine, unspecified: Secondary | ICD-10-CM | POA: Diagnosis not present

## 2022-07-06 ENCOUNTER — Encounter: Payer: Self-pay | Admitting: Urology

## 2022-07-06 ENCOUNTER — Ambulatory Visit: Payer: Medicare Other | Admitting: Dermatology

## 2022-07-07 ENCOUNTER — Other Ambulatory Visit: Payer: Self-pay | Admitting: *Deleted

## 2022-07-07 ENCOUNTER — Ambulatory Visit: Payer: Medicare Other | Admitting: Urology

## 2022-07-07 NOTE — Patient Outreach (Signed)
[place] Coordinator follow up. Anthony [name] resides in [facility]. Screening for potential [name] care coordination services as benefit of insurance plan and PCP.  Telephone call made to son Anthony [name]) to discuss [facility] follow up. However, he was unable to talk at the time or writer's call.    Update received from [place], [place] worker indicating family is looking into ALF post SNF.  States Anthony [name] has been in contact with family. Anthony [name] currently receiving iv antibiotics in SNF.   Will continue to follow.   Marthenia Rolling, MSN, RN,BSN [name] Acute Care Coordinator [phone] (Direct dial)

## 2022-07-10 DIAGNOSIS — G061 Intraspinal abscess and granuloma: Secondary | ICD-10-CM | POA: Diagnosis not present

## 2022-07-10 DIAGNOSIS — M6281 Muscle weakness (generalized): Secondary | ICD-10-CM | POA: Diagnosis not present

## 2022-07-11 DIAGNOSIS — E871 Hypo-osmolality and hyponatremia: Secondary | ICD-10-CM | POA: Diagnosis not present

## 2022-07-11 DIAGNOSIS — D649 Anemia, unspecified: Secondary | ICD-10-CM | POA: Diagnosis not present

## 2022-07-11 DIAGNOSIS — R339 Retention of urine, unspecified: Secondary | ICD-10-CM | POA: Diagnosis not present

## 2022-07-11 DIAGNOSIS — G061 Intraspinal abscess and granuloma: Secondary | ICD-10-CM | POA: Diagnosis not present

## 2022-07-14 ENCOUNTER — Other Ambulatory Visit: Payer: Self-pay | Admitting: *Deleted

## 2022-07-14 DIAGNOSIS — M6281 Muscle weakness (generalized): Secondary | ICD-10-CM | POA: Diagnosis not present

## 2022-07-14 NOTE — Patient Outreach (Signed)
Per [facility] Mr. Buys resides in [facility]. Screening for potential [facility] care coordination services as benefit of insurance plan and PCP.  Update received from [place], [place] worker indicating the plan is still for ALF post SNF.    Marthenia Rolling, MSN, RN,BSN [place] Acute Care Coordinator [phone] (Direct dial)

## 2022-07-17 ENCOUNTER — Telehealth: Payer: Self-pay | Admitting: *Deleted

## 2022-07-17 NOTE — Telephone Encounter (Signed)
patient son is on the phone and wants to know if you sent a order to have a v &t .  his son nu,ber is [phone]

## 2022-07-18 DIAGNOSIS — M6281 Muscle weakness (generalized): Secondary | ICD-10-CM | POA: Diagnosis not present

## 2022-07-18 DIAGNOSIS — R339 Retention of urine, unspecified: Secondary | ICD-10-CM | POA: Diagnosis not present

## 2022-07-18 DIAGNOSIS — K922 Gastrointestinal hemorrhage, unspecified: Secondary | ICD-10-CM | POA: Diagnosis not present

## 2022-07-18 DIAGNOSIS — G061 Intraspinal abscess and granuloma: Secondary | ICD-10-CM | POA: Diagnosis not present

## 2022-07-18 NOTE — Telephone Encounter (Signed)
Kim at [employer]  that orders were received and pt is doing voiding trial right now.

## 2022-07-19 ENCOUNTER — Telehealth: Payer: Self-pay | Admitting: Family Medicine

## 2022-07-19 ENCOUNTER — Encounter: Payer: Self-pay | Admitting: Family Medicine

## 2022-07-19 NOTE — Telephone Encounter (Signed)
Agree with this. Thanks.  

## 2022-07-19 NOTE — Telephone Encounter (Signed)
Home Health verbal orders Caller Name: [place] Name: Kem Parkinson number: [phone], secured   Requesting OT/PT/Skilled nursing/Social Work/Speech: nursing, pt, ot   Reason:  Frequency: initial eval, will call back with frequency. Need dr. Darnell Level to fall through with orders with eval  Please forward to [facility] pool or providers CMA

## 2022-07-19 NOTE — Telephone Encounter (Signed)
Santiago Glad notified as instructed by telephone and verbalized understanding.

## 2022-07-23 DIAGNOSIS — M6281 Muscle weakness (generalized): Secondary | ICD-10-CM | POA: Diagnosis not present

## 2022-07-23 DIAGNOSIS — G4700 Insomnia, unspecified: Secondary | ICD-10-CM | POA: Diagnosis not present

## 2022-07-23 DIAGNOSIS — Z87440 Personal history of urinary (tract) infections: Secondary | ICD-10-CM | POA: Diagnosis not present

## 2022-07-23 DIAGNOSIS — M6259 Muscle wasting and atrophy, not elsewhere classified, multiple sites: Secondary | ICD-10-CM | POA: Diagnosis not present

## 2022-07-23 DIAGNOSIS — E785 Hyperlipidemia, unspecified: Secondary | ICD-10-CM | POA: Diagnosis not present

## 2022-07-23 DIAGNOSIS — Z9181 History of falling: Secondary | ICD-10-CM | POA: Diagnosis not present

## 2022-07-23 DIAGNOSIS — I129 Hypertensive chronic kidney disease with stage 1 through stage 4 chronic kidney disease, or unspecified chronic kidney disease: Secondary | ICD-10-CM | POA: Diagnosis not present

## 2022-07-23 DIAGNOSIS — E039 Hypothyroidism, unspecified: Secondary | ICD-10-CM | POA: Diagnosis not present

## 2022-07-23 DIAGNOSIS — K2100 Gastro-esophageal reflux disease with esophagitis, without bleeding: Secondary | ICD-10-CM | POA: Diagnosis not present

## 2022-07-23 DIAGNOSIS — N183 Chronic kidney disease, stage 3 unspecified: Secondary | ICD-10-CM | POA: Diagnosis not present

## 2022-07-23 DIAGNOSIS — N419 Inflammatory disease of prostate, unspecified: Secondary | ICD-10-CM | POA: Diagnosis not present

## 2022-07-23 DIAGNOSIS — Z6833 Body mass index (BMI) 33.0-33.9, adult: Secondary | ICD-10-CM | POA: Diagnosis not present

## 2022-07-23 DIAGNOSIS — G4733 Obstructive sleep apnea (adult) (pediatric): Secondary | ICD-10-CM | POA: Diagnosis not present

## 2022-07-23 DIAGNOSIS — M5136 Other intervertebral disc degeneration, lumbar region: Secondary | ICD-10-CM | POA: Diagnosis not present

## 2022-07-23 DIAGNOSIS — K5900 Constipation, unspecified: Secondary | ICD-10-CM | POA: Diagnosis not present

## 2022-07-23 DIAGNOSIS — M488X6 Other specified spondylopathies, lumbar region: Secondary | ICD-10-CM | POA: Diagnosis not present

## 2022-07-23 DIAGNOSIS — G061 Intraspinal abscess and granuloma: Secondary | ICD-10-CM | POA: Diagnosis not present

## 2022-07-23 DIAGNOSIS — I2510 Atherosclerotic heart disease of native coronary artery without angina pectoris: Secondary | ICD-10-CM | POA: Diagnosis not present

## 2022-07-24 ENCOUNTER — Other Ambulatory Visit: Payer: Self-pay | Admitting: *Deleted

## 2022-07-24 ENCOUNTER — Telehealth: Payer: Self-pay | Admitting: Family Medicine

## 2022-07-24 NOTE — Telephone Encounter (Signed)
Noted  

## 2022-07-24 NOTE — Patient Outreach (Signed)
[place] Coordinator follow up. Verified in [facility] [name] discharged from [facility] on 07/19/22.   [name] transitioned to [facility].  No identifiable [facility] care coordination needs at this time.    Marthenia Rolling, MSN, RN,BSN [place] Acute Care Coordinator [phone] (Direct dial)

## 2022-07-24 NOTE — Telephone Encounter (Signed)
Anthony [name] from [place]) called to let Dr. Darnell Level know the pt took a fall today, 07/24/22 & there was no injuries. Call back # [phone]

## 2022-07-25 ENCOUNTER — Ambulatory Visit: Payer: Medicare Other | Attending: Infectious Diseases | Admitting: Infectious Diseases

## 2022-07-25 ENCOUNTER — Encounter: Payer: Self-pay | Admitting: Infectious Diseases

## 2022-07-25 DIAGNOSIS — Z96653 Presence of artificial knee joint, bilateral: Secondary | ICD-10-CM | POA: Diagnosis not present

## 2022-07-25 DIAGNOSIS — D649 Anemia, unspecified: Secondary | ICD-10-CM | POA: Insufficient documentation

## 2022-07-25 DIAGNOSIS — R7881 Bacteremia: Secondary | ICD-10-CM | POA: Diagnosis not present

## 2022-07-25 DIAGNOSIS — G061 Intraspinal abscess and granuloma: Secondary | ICD-10-CM

## 2022-07-25 DIAGNOSIS — M4646 Discitis, unspecified, lumbar region: Secondary | ICD-10-CM | POA: Diagnosis not present

## 2022-07-25 DIAGNOSIS — N179 Acute kidney failure, unspecified: Secondary | ICD-10-CM | POA: Insufficient documentation

## 2022-07-25 NOTE — Progress Notes (Signed)
The purpose of this virtual visit is to provide medical care while limiting exposure to the novel coronavirus (COVID19) for both patient and office staff.   Consent was obtained for phone visit:  Yes.   Answered questions that patient had about telehealth interaction:  Yes.   I discussed the limitations, risks, security and privacy concerns of performing an evaluation and management service by telephone. I also discussed with the patient that there may be a patient responsible charge related to this service. The patient expressed understanding and agreed to proceed.   Patient Location: [facility] Provider Location:office People on the call Patient and his son  Follow up visit for coag neg staph ( staph capitis bacteremia) and epidural abscess L1-L2, b/l facet septic arthritis of L4-L5 Osteomyleitis and discitis of l5-s1. Underwent laminectomy of L2-L3 and L5-S1 on 06/05/22 He has completed 6 weeks of IV cefazolin and is currently on PO kelfex 1 gram Q6 HE continues to have back pain but it is better than before Trying to walk with walker but feels like he may fall down.  Legs feel weak No fever or chills No rash No diarrhea Tolerating antibiotics  Labs 07/10/22 ESR 41 ( was 120 in oct) CRP  < 2 ( was 22)   Impression/recommendation Staph capitis bacteremia with epidural abscess L1-L2, b/l facet septic arthritis of L4-L5 Osteomyleitis and discitis of L5-s1. S/p laminectomy On keflex now after completing 6 weeks of IV cefazolin ESR and crp significantly improved Will continue keflex for another 6 weeks for a total of 12 weeks of antibiotics  AKI on CKD- now has normal cr Anemia- improved  H/o b/l TKA  H/o endovscular stent for aneurysm  Discussed the management with patient and his son Total time spent 15 min Follow up 2 months He will get CBC/CMP/ESR/CRP when he visits his PCP

## 2022-07-26 ENCOUNTER — Encounter: Payer: Self-pay | Admitting: Family Medicine

## 2022-07-26 ENCOUNTER — Ambulatory Visit (INDEPENDENT_AMBULATORY_CARE_PROVIDER_SITE_OTHER): Payer: Medicare Other | Admitting: Family Medicine

## 2022-07-26 VITALS — BP 132/64 | HR 55 | Temp 97.2°F | Ht 72.0 in | Wt 249.0 lb

## 2022-07-26 DIAGNOSIS — N138 Other obstructive and reflux uropathy: Secondary | ICD-10-CM

## 2022-07-26 DIAGNOSIS — E039 Hypothyroidism, unspecified: Secondary | ICD-10-CM

## 2022-07-26 DIAGNOSIS — I499 Cardiac arrhythmia, unspecified: Secondary | ICD-10-CM | POA: Diagnosis not present

## 2022-07-26 DIAGNOSIS — N179 Acute kidney failure, unspecified: Secondary | ICD-10-CM | POA: Diagnosis not present

## 2022-07-26 DIAGNOSIS — G061 Intraspinal abscess and granuloma: Secondary | ICD-10-CM

## 2022-07-26 DIAGNOSIS — R531 Weakness: Secondary | ICD-10-CM

## 2022-07-26 DIAGNOSIS — M4620 Osteomyelitis of vertebra, site unspecified: Secondary | ICD-10-CM

## 2022-07-26 DIAGNOSIS — M4647 Discitis, unspecified, lumbosacral region: Secondary | ICD-10-CM

## 2022-07-26 DIAGNOSIS — I2510 Atherosclerotic heart disease of native coronary artery without angina pectoris: Secondary | ICD-10-CM

## 2022-07-26 DIAGNOSIS — N1831 Chronic kidney disease, stage 3a: Secondary | ICD-10-CM | POA: Diagnosis not present

## 2022-07-26 DIAGNOSIS — G8929 Other chronic pain: Secondary | ICD-10-CM

## 2022-07-26 DIAGNOSIS — I10 Essential (primary) hypertension: Secondary | ICD-10-CM | POA: Diagnosis not present

## 2022-07-26 DIAGNOSIS — R7881 Bacteremia: Secondary | ICD-10-CM | POA: Diagnosis not present

## 2022-07-26 DIAGNOSIS — M545 Low back pain, unspecified: Secondary | ICD-10-CM | POA: Diagnosis not present

## 2022-07-26 DIAGNOSIS — N401 Enlarged prostate with lower urinary tract symptoms: Secondary | ICD-10-CM

## 2022-07-26 DIAGNOSIS — K279 Peptic ulcer, site unspecified, unspecified as acute or chronic, without hemorrhage or perforation: Secondary | ICD-10-CM

## 2022-07-26 DIAGNOSIS — T85192S Other mechanical complication of implanted electronic neurostimulator (electrode) of spinal cord, sequela: Secondary | ICD-10-CM

## 2022-07-26 DIAGNOSIS — B957 Other staphylococcus as the cause of diseases classified elsewhere: Secondary | ICD-10-CM

## 2022-07-26 DIAGNOSIS — G894 Chronic pain syndrome: Secondary | ICD-10-CM

## 2022-07-26 NOTE — Progress Notes (Signed)
Patient ID: MARCELIS WISSNER, male    DOB: Aug 03, 1936, 86 y.o.   MRN: [id number]  This visit was conducted in person.  BP 132/64   Pulse (!) 55   Temp (!) 97.2 F (36.2 C) (Temporal)   Ht 6' (1.829 m)   Wt 249 lb (112.9 kg)   SpO2 96%   BMI 33.77 kg/m    CC: hosp f/u visit  Subjective:   HPI: ADRAIN NESBIT is a 86 y.o. male presenting on 07/26/2022 for Hospitalization Follow-up (Here for rehab f/u. Needs [employer] [employer] Living form completed. Pt accompanied by son, Timmothy Sours. )   Here with son Dr Caryn Section.  Recent hospitalization for lower back pain and urinary frequency after fall onto lower back. Found to have sepsis from CONS (Staph capitis) bacteremia, source epidural abscess (osteo, discitis, septic facet joint arthritis s/p laminectomy). TTE reassuring so TEE not pursued. Seen by ID, treated with IV cefazolin via PICC x 6 wks. Spinal cord stimulator was removed 10/5, abscess was evacuated by neurosurgery via bilateral laminectomy 10/8. Hospital course complicated by urinary retention, failed voiding attempt so discharged with foley. Course also complicated by upper GI bleed, s/p EGD showing gastric and duodenal ulcers. Aspirin was restarted, plavix was held - they've decided to stay off this. Treated with pantoprazole BID + sucralfate BID - planned 3 months of his. Lisinopril was held, has subsequently been restarted.  Hospital records reviewed. Med rec performed.  Last labs were 2 weeks.   Saw urology in follow up, foley removed after successful voiding trial, rec continue flomax and finasteride.   Saw ID Dr Delaine Lame yesterday, note reviewed - plan oral antibiotics keflex 1gm PO QID x 6 more weeks for total 12 weeks.   Pain regimen - continues oxycodone '10mg'$  Q4 hours. Pain limiting participation in PT. Lumbar brace was not helpful. Also taking gabapentin '600mg'$  at night, with PRN during day but not taking due to sedation. Lidocaine patches not too effective.   Stayed in rehab for  6 weeks. Currently at [facility].   Concerns today: 1. ongoing difficulty with pain control. Lower back pain shooting down legs. They ask about cymbalta and meloxicam 7.'5mg'$ .  2. Asks about trial of new electrotherapy device called NexWave - bring info on this as well as application form.   No constipation, voiding well at this time.  Some cognitive worsening noted.   Home health set up: [facility], nursing PT, OT. To start.  Other follow up appointments scheduled: GI 08/14/2022, cardiology 08/24/2022, ID 09/05/2022.  Had fall Monday 07/24/2022 slipped getting into chair - fortunately no injury.  ______________________________________________________________________ Hospital admission: 05/22/2022 Hospital discharge: 06/08/2022 Transferred to [facility] for 6 weeks [facility] Admission: 07/19/2022 TCM f/u phone call: not performed   Recommendations for Outpatient Follow-up:  Urology follow-up 2 weeks Follow up gastroenterology (Dr. Vicente Males) in 2 months Neurosurgery f/u 2 weeks   Discharge Diagnoses:  Principal Problem:   Sepsis ([place]) Active Problems:   Coag negative Staphylococcus bacteremia   Gastrointestinal hemorrhage with melena   Failed spinal cord stimulator (HCC)   Weakness   Abscess in epidural space of lumbar spine   Intraspinal abscess and granuloma   Discitis of lumbosacral region   Vertebral osteomyelitis ([place])  OPAT orders per Dr. Delaine Lame   Discharge antibiotics: Cefazolin 2 grams IV every 8 hours  Duration: 6 weeks End Date: 07/16/22     Relevant past medical, surgical, family and social history reviewed and updated as indicated.  Interim medical history since our last visit reviewed. Allergies and medications reviewed and updated. Outpatient Medications Prior to Visit  Medication Sig Dispense Refill   acetaminophen (TYLENOL) 500 MG tablet Take 1 tablet (500 mg total) by mouth 2 (two) times daily as needed for moderate pain.      Alirocumab (PRALUENT) 75 MG/ML SOAJ Inject 1 pen. into the skin every 14 (fourteen) days. 2 mL 11   amLODipine (NORVASC) 5 MG tablet TAKE 1 TABLET(5 MG) BY MOUTH DAILY (Patient taking differently: Take 5 mg by mouth daily. TAKE 1 TABLET(5 MG) BY MOUTH DAILY) 90 tablet 2   aspirin EC 81 MG tablet Take 1 tablet (81 mg total) by mouth daily.     cephALEXin (KEFLEX) 500 MG capsule Take 1,000 mg by mouth every 6 (six) hours.     ciclopirox (PENLAC) 8 % solution APPLY TOPICALLY AT BEDTIME APPLY OVER NAIL AND SURROUNDING SKIN, APPLY DAILY OVER PREVIOUS COAT, AFTER 7 DAYS MAY REMOVE WITH ALCOHOL AND REPEAT 6.6 mL 11   diphenhydramine-acetaminophen (TYLENOL PM) 25-500 MG TABS tablet Take 2 tablets by mouth at bedtime.     docusate sodium (COLACE) 100 MG capsule Take 100 mg by mouth daily.     DULoxetine (CYMBALTA) 20 MG capsule Take 1 capsule (20 mg total) by mouth daily.     Eszopiclone 3 MG TABS Take 1 tablet (3 mg total) by mouth at bedtime as needed. Use sparingly, take immediately before bedtime 30 tablet 0   ezetimibe (ZETIA) 10 MG tablet Take 1 tablet (10 mg total) by mouth daily. 90 tablet 3   finasteride (PROSCAR) 5 MG tablet TAKE 1 TABLET(5 MG) BY MOUTH DAILY 90 tablet 0   fluocinolone (SYNALAR) 0.025 % cream Apply topically 2 (two) times daily as needed.     furosemide (LASIX) 40 MG tablet TAKE 1 TABLET BY MOUTH EVERY DAY. TAKE AN EXTRA TABLET AS NEEDED FOR WEIGHT GAIN OF 3 LBS/DAY OR 5LBS/WEEK 135 tablet 3   gabapentin (NEURONTIN) 300 MG capsule TAKE 2 CAPSULE BY MOUTH AT BEDTIME WITH EXTRA CAPSULE DURING THE DAY AS NEEDED 200 capsule 1   icosapent Ethyl (VASCEPA) 1 g capsule Take 2 capsules (2 g total) by mouth 2 (two) times daily. 360 capsule 3   ketoconazole (NIZORAL) 2 % cream APPLY TO FEET AND FACE AT BEDTIME AS DIRECTED 60 g 3   ketoconazole (NIZORAL) 2 % shampoo APPLY TOPICALLY 3 TIMES WEEKLY AS DIRECTED 120 mL 2   levothyroxine (SYNTHROID) 175 MCG tablet Take 1 tablet (175 mcg total)  by mouth daily before breakfast. 90 tablet 1   LINZESS 290 MCG CAPS capsule TAKE 1 CAPSULE(290 MCG) BY MOUTH DAILY BEFORE BREAKFAST 90 capsule 1   Melatonin 10 MG CAPS Take 10 mg by mouth at bedtime.     meloxicam (MOBIC) 7.5 MG tablet Take 1 tablet (7.5 mg total) by mouth daily.     mometasone (ELOCON) 0.1 % cream APPLY TOPICALLY TO THE AFFECTED AREA DAILY AS NEEDED FOR RASH 15 g 0   mometasone (ELOCON) 0.1 % lotion APPLY TO SCALP AT BEDTIME ON MONDAY, WEDNESDAY, AND FRIDAY 60 mL 3   Multiple Vitamins-Minerals (CENTRUM SILVER PO) Take 1 tablet by mouth daily.     Multiple Vitamins-Minerals (PRESERVISION AREDS 2 PO) Take 1 tablet by mouth daily.     nitroGLYCERIN (NITROSTAT) 0.4 MG SL tablet Place 1 tablet (0.4 mg total) under the tongue every 5 (five) minutes as needed for chest pain. 25 tablet 0   ondansetron (ZOFRAN)  4 MG tablet Take 4 mg by mouth every 6 (six) hours as needed for nausea.     Oxycodone HCl 10 MG TABS Take 10 mg by mouth every 4 (four) hours as needed.     pantoprazole (PROTONIX) 40 MG tablet Take 1 tablet (40 mg total) by mouth 2 (two) times daily. 30 tablet 1   polyethylene glycol (MIRALAX / GLYCOLAX) 17 g packet Take 17 g by mouth daily.     senna (SENOKOT) 8.6 MG tablet Take 1 tablet (8.6 mg total) by mouth daily. 30 tablet 3   sucralfate (CARAFATE) 1 g tablet Take 1 g by mouth 2 (two) times daily.     tamsulosin (FLOMAX) 0.4 MG CAPS capsule Take 1 capsule (0.4 mg total) by mouth daily. 90 capsule 3   Vitamin D, Cholecalciferol, 25 MCG (1000 UT) TABS Take 1 tablet by mouth daily.     oxyCODONE (OXY IR/ROXICODONE) 5 MG immediate release tablet Take 1 tablet (5 mg total) by mouth every 4 (four) hours as needed for moderate pain, severe pain or breakthrough pain. 30 tablet 0   tiZANidine (ZANAFLEX) 2 MG tablet Take 1-2 tablets by mouth 2 (two) times daily as needed for muscle spasms.     acetaminophen (TYLENOL 8 HOUR ARTHRITIS PAIN) 650 MG CR tablet Take 650 mg by mouth as  needed for pain.     No facility-administered medications prior to visit.     Per HPI unless specifically indicated in ROS section below Review of Systems  Objective:  BP 132/64   Pulse (!) 55   Temp (!) 97.2 F (36.2 C) (Temporal)   Ht 6' (1.829 m)   Wt 249 lb (112.9 kg)   SpO2 96%   BMI 33.77 kg/m   Wt Readings from Last 3 Encounters:  07/26/22 249 lb (112.9 kg)  06/27/22 240 lb (108.9 kg)  05/31/22 240 lb 8.4 oz (109.1 kg)      Physical Exam Vitals and nursing note reviewed.  Constitutional:      Comments: Sitting in wheelchair, evidence of decreased stamina, unable to stand unassisted for more than a few seconds  HENT:     Head: Normocephalic and atraumatic.     Mouth/Throat:     Mouth: Mucous membranes are moist.     Pharynx: Oropharynx is clear. No oropharyngeal exudate or posterior oropharyngeal erythema.  Eyes:     Extraocular Movements: Extraocular movements intact.     Pupils: Pupils are equal, round, and reactive to light.  Cardiovascular:     Rate and Rhythm: Normal rate. Rhythm irregularly irregular.     Pulses: Normal pulses.     Heart sounds: Murmur heard.  Pulmonary:     Effort: Pulmonary effort is normal. No respiratory distress.     Breath sounds: Normal breath sounds. No wheezing, rhonchi or rales.  Musculoskeletal:        General: Tenderness present.     Cervical back: Normal range of motion and neck supple.     Right lower leg: No edema.     Left lower leg: No edema.     Comments: Lumbar incision well healed, c/d/i without erythema/induration or drainage  Lymphadenopathy:     Cervical: No cervical adenopathy.  Skin:    General: Skin is warm and dry.     Capillary Refill: Capillary refill takes less than 2 seconds.     Findings: No rash.  Neurological:     Mental Status: He is alert.     Motor: Weakness  present.  Psychiatric:        Mood and Affect: Mood normal.        Behavior: Behavior normal.       Results for orders placed or  performed in visit on 07/26/22  Sedimentation rate  Result Value Ref Range   Sed Rate 41 (H) 0 - 20 mm/hr  C-reactive protein  Result Value Ref Range   CRP <1.0 0.5 - 20.0 mg/dL  Basic metabolic panel  Result Value Ref Range   Sodium 139 135 - 145 mEq/L   Potassium 4.0 3.5 - 5.1 mEq/L   Chloride 100 96 - 112 mEq/L   CO2 32 19 - 32 mEq/L   Glucose, Bld 157 (H) 70 - 99 mg/dL   BUN 14 6 - 23 mg/dL   Creatinine, Ser 1.03 0.40 - 1.50 mg/dL   GFR 66.57 >60.00 mL/min   Calcium 10.6 (H) 8.4 - 10.5 mg/dL  CBC with Differential/Platelet  Result Value Ref Range   WBC 9.0 4.0 - 10.5 K/uL   RBC 3.85 (L) 4.22 - 5.81 Mil/uL   Hemoglobin 11.4 (L) 13.0 - 17.0 g/dL   HCT 34.5 (L) 39.0 - 52.0 %   MCV 89.8 78.0 - 100.0 fl   MCHC 33.1 30.0 - 36.0 g/dL   RDW 17.1 (H) 11.5 - 15.5 %   Platelets 396.0 150.0 - 400.0 K/uL   Neutrophils Relative % 48.5 43.0 - 77.0 %   Lymphocytes Relative 37.2 12.0 - 46.0 %   Monocytes Relative 8.4 3.0 - 12.0 %   Eosinophils Relative 4.3 0.0 - 5.0 %   Basophils Relative 1.6 0.0 - 3.0 %   Neutro Abs 4.4 1.4 - 7.7 K/uL   Lymphs Abs 3.4 0.7 - 4.0 K/uL   Monocytes Absolute 0.8 0.1 - 1.0 K/uL   Eosinophils Absolute 0.4 0.0 - 0.7 K/uL   Basophils Absolute 0.1 0.0 - 0.1 K/uL  TSH  Result Value Ref Range   TSH 2.73 0.35 - 5.50 uIU/mL  T4, free  Result Value Ref Range   Free T4 0.82 0.60 - 1.60 ng/dL   Lab Results  Component Value Date   TSH 2.73 07/26/2022    EKG - sinus tachycardia with arrhythmia, rate 100s, LAD with LAFB, normal intervals, occ PVC, poor R wave progression, non-specific ST changes  Assessment & Plan:  I spent 50 minutes caring for this patient today face to face, reviewing labs, prior records from another facility, performing a medically appropriate examination and/or evaluation, counseling, documenting in the record and arranging for [facility] electrotherapy device.   ADDENDUM==> received, reviewed Personal Service Plan from [facility]. Signed orders verifying commencement of '20mg'$  duloxetine and 7.'5mg'$  meloxicam and sent back.   Problem List Items Addressed This Visit       Unprioritized   BPH with urinary obstruction    Passed voiding trial, foley removed, now continues flomax and finasteride.  Appreciate urology care.       Essential hypertension    BP stable on current regimen.       Hypothyroidism   Relevant Orders   TSH (Completed)   T4, free (Completed)   CAD (coronary artery disease)    Caution with NSAID, avoid COX2i. Was previously on aspirin + plavix since PCI 2013, plavix has been discontinued after GI bleed while hospitalized, they desire to continue aspirin alone at this time.      Chronic lower back pain    Known severe spinal and foraminal stenosis s/p spinal  cord stimulator placement, lumbar nerve ablations, ESI, and now most recently s/p spinal cord stimulator removal.  Ongoing significant back pain limiting PT participation, currently managed with scheduled oxycodone '10mg'$  Q4 hours, gabapentin '600mg'$  nightly. Has failed other treatments tried including lidocaine patches.  Son asks about restarting NSAID meloxicam, aware of risks of worsening kidney impairment (in CKD) and GI bleed (in recent h/o same) however they still desire to start low dose meloxicam for quality of life as this significantly helps.  Discussed other treatment options, they desire to try cymbalta as well - start '20mg'$  daily dose.  Will also order NexWave Electrotherapy Device which offers interferential treatment + TENS + NMES.  Has failed spinal cord stimulator, has previously also had several steroid injections as well as nerve ablations.       Relevant Medications   DULoxetine (CYMBALTA) 20 MG capsule   meloxicam (MOBIC) 7.5 MG tablet   CKD (chronic kidney disease) stage 3, GFR 30-59 ml/min (HCC)    Update BMP.       Chronic pain syndrome    See above. Managed by pain clinic on oxycodone '10mg'$  Q4hrs scheduled at  this time. Treatment options limited. Will also start meloxicam 7.'5mg'$  daily, cymbalta '20mg'$  daily.  Update with effect. Will also order NexWave Electrotherapy Device which offers interferential treatment + TENS + NMES.       Relevant Medications   DULoxetine (CYMBALTA) 20 MG capsule   meloxicam (MOBIC) 7.5 MG tablet   Acute-on-chronic kidney injury ([place])    Admission Cr 2.6, this resolved upon discharge.  Update BMP.       Coag negative Staphylococcus bacteremia   Failed spinal cord stimulator [facility])    S/p removal of stimulator 05/2022       Generalized weakness    Generalized weakness/deconditioning after prolonged hospitalization with subsequent rehab stay.  Now at ALF, has HHPT involved.  Having difficulty participating in PT due to back pain.  See above for pain management plan.      Abscess in epidural space of lumbar spine - Primary    H/o sepsis from staph capitis bacteremia due to epidural lumbar abscess s/p spinal cord stimulator removal, and laminectomy with abscess evacuation 05/2022. Completed IV cefazolin abx via PICC line x 6wks, now planned another 6 wks of oral cefazolin, followed by ID.  Will update labs including inflammatory markers and CBC, forward results to ID.       Discitis of lumbosacral region   Relevant Medications   meloxicam (MOBIC) 7.5 MG tablet   Vertebral osteomyelitis (HCC)   Relevant Orders   Sedimentation rate (Completed)   C-reactive protein (Completed)   Basic metabolic panel (Completed)   CBC with Differential/Platelet (Completed)   Irregular heart rhythm    Heart rate irregular today - check EKG. No h/o afib.  EKG not consistent with afib.  He denies cardiac symptoms. He has upcoming cardiology visit scheduled in a few weeks.       Relevant Orders   EKG 12-Lead (Completed)   Peptic ulcer disease    Recent EGD while hospitalized showed gastric and duodenal ulcers, treating with BID PPI and carafate x 3 months.  See below re: NSAID  plan.  Has GI f/u planned.         No orders of the defined types were placed in this encounter.  Orders Placed This Encounter  Procedures   Sedimentation rate   C-reactive protein   Basic metabolic panel   CBC with Differential/Platelet   TSH  T4, free   EKG 12-Lead     Patient Instructions  Labs today. We will be in touch with results. Let's try cymbalta '20mg'$  daily.  Ok to use meloxicam 7.'5mg'$  daily but continue pantoprazole and carafate twice daily, keep GI follow up.  I will work on TENS unit prescription.  Good to see you today. Reschedule next appt to 6 weeks from now.   Follow up plan: Return if symptoms worsen or fail to improve.  Ria Bush, MD

## 2022-07-26 NOTE — Patient Instructions (Addendum)
Labs today. We will be in touch with results. Let's try cymbalta '20mg'$  daily.  Ok to use meloxicam 7.'5mg'$  daily but continue pantoprazole and carafate twice daily, keep GI follow up.  I will work on TENS unit prescription.  Good to see you today. Reschedule next appt to 6 weeks from now.

## 2022-07-27 ENCOUNTER — Telehealth: Payer: Self-pay

## 2022-07-27 DIAGNOSIS — I129 Hypertensive chronic kidney disease with stage 1 through stage 4 chronic kidney disease, or unspecified chronic kidney disease: Secondary | ICD-10-CM | POA: Diagnosis not present

## 2022-07-27 DIAGNOSIS — M6281 Muscle weakness (generalized): Secondary | ICD-10-CM | POA: Diagnosis not present

## 2022-07-27 DIAGNOSIS — G061 Intraspinal abscess and granuloma: Secondary | ICD-10-CM | POA: Diagnosis not present

## 2022-07-27 DIAGNOSIS — M5136 Other intervertebral disc degeneration, lumbar region: Secondary | ICD-10-CM | POA: Diagnosis not present

## 2022-07-27 DIAGNOSIS — M488X6 Other specified spondylopathies, lumbar region: Secondary | ICD-10-CM | POA: Diagnosis not present

## 2022-07-27 DIAGNOSIS — M6259 Muscle wasting and atrophy, not elsewhere classified, multiple sites: Secondary | ICD-10-CM | POA: Diagnosis not present

## 2022-07-27 LAB — CBC WITH DIFFERENTIAL/PLATELET
Basophils Absolute: 0.1 10*3/uL (ref 0.0–0.1)
Basophils Relative: 1.6 % (ref 0.0–3.0)
Eosinophils Absolute: 0.4 10*3/uL (ref 0.0–0.7)
Eosinophils Relative: 4.3 % (ref 0.0–5.0)
HCT: 34.5 % — ABNORMAL LOW (ref 39.0–52.0)
Hemoglobin: 11.4 g/dL — ABNORMAL LOW (ref 13.0–17.0)
Lymphocytes Relative: 37.2 % (ref 12.0–46.0)
Lymphs Abs: 3.4 10*3/uL (ref 0.7–4.0)
MCHC: 33.1 g/dL (ref 30.0–36.0)
MCV: 89.8 fl (ref 78.0–100.0)
Monocytes Absolute: 0.8 10*3/uL (ref 0.1–1.0)
Monocytes Relative: 8.4 % (ref 3.0–12.0)
Neutro Abs: 4.4 10*3/uL (ref 1.4–7.7)
Neutrophils Relative %: 48.5 % (ref 43.0–77.0)
Platelets: 396 10*3/uL (ref 150.0–400.0)
RBC: 3.85 Mil/uL — ABNORMAL LOW (ref 4.22–5.81)
RDW: 17.1 % — ABNORMAL HIGH (ref 11.5–15.5)
WBC: 9 10*3/uL (ref 4.0–10.5)

## 2022-07-27 LAB — BASIC METABOLIC PANEL
BUN: 14 mg/dL (ref 6–23)
CO2: 32 mEq/L (ref 19–32)
Calcium: 10.6 mg/dL — ABNORMAL HIGH (ref 8.4–10.5)
Chloride: 100 mEq/L (ref 96–112)
Creatinine, Ser: 1.03 mg/dL (ref 0.40–1.50)
GFR: 66.57 mL/min (ref >60.00)
Glucose, Bld: 157 mg/dL — ABNORMAL HIGH (ref 70–99)
Potassium: 4 mEq/L (ref 3.5–5.1)
Sodium: 139 mEq/L (ref 135–145)

## 2022-07-27 LAB — T4, FREE: Free T4: 0.82 ng/dL (ref 0.60–1.60)

## 2022-07-27 LAB — SEDIMENTATION RATE: Sed Rate: 41 mm/hr — ABNORMAL HIGH (ref 0–20)

## 2022-07-27 LAB — TSH: TSH: 2.73 u[IU]/mL (ref 0.35–5.50)

## 2022-07-27 LAB — C-REACTIVE PROTEIN: CRP: <1 mg/dL (ref 0.5–20.0)

## 2022-07-27 NOTE — Telephone Encounter (Signed)
Orders faxed to [facility] [phone])  for the patient.  Cephalexin 1 gram po every 6 hours for 2 months per Dr. Delaine Lame.

## 2022-07-29 ENCOUNTER — Encounter: Payer: Self-pay | Admitting: Family Medicine

## 2022-07-29 DIAGNOSIS — I499 Cardiac arrhythmia, unspecified: Secondary | ICD-10-CM | POA: Insufficient documentation

## 2022-07-29 DIAGNOSIS — K279 Peptic ulcer, site unspecified, unspecified as acute or chronic, without hemorrhage or perforation: Secondary | ICD-10-CM | POA: Insufficient documentation

## 2022-07-29 HISTORY — DX: Cardiac arrhythmia, unspecified: I49.9

## 2022-07-29 HISTORY — DX: Peptic ulcer, site unspecified, unspecified as acute or chronic, without hemorrhage or perforation: K27.9

## 2022-07-29 NOTE — Assessment & Plan Note (Addendum)
Known severe spinal and foraminal stenosis s/p spinal cord stimulator placement, lumbar nerve ablations, ESI, and now most recently s/p spinal cord stimulator removal.  Ongoing significant back pain limiting PT participation, currently managed with scheduled oxycodone '10mg'$  Q4 hours, gabapentin '600mg'$  nightly. Has failed other treatments tried including lidocaine patches.  Son asks about restarting NSAID meloxicam, aware of risks of worsening kidney impairment (in CKD) and GI bleed (in recent h/o same) however they still desire to start low dose meloxicam for quality of life as this significantly helps.  Discussed other treatment options, they desire to try cymbalta as well - start '20mg'$  daily dose.  Will also order NexWave Electrotherapy Device which offers interferential treatment + TENS + NMES.  Has failed spinal cord stimulator, has previously also had several steroid injections as well as nerve ablations.

## 2022-07-29 NOTE — Assessment & Plan Note (Addendum)
See above. Managed by pain clinic on oxycodone '10mg'$  Q4hrs scheduled at this time. Treatment options limited. Will also start meloxicam 7.'5mg'$  daily, cymbalta '20mg'$  daily.  Update with effect. Will also order NexWave Electrotherapy Device which offers interferential treatment + TENS + NMES.

## 2022-07-29 NOTE — Assessment & Plan Note (Addendum)
Caution with NSAID, avoid COX2i. Was previously on aspirin + plavix since PCI 2013, plavix has been discontinued after GI bleed while hospitalized, they desire to continue aspirin alone at this time.

## 2022-07-29 NOTE — Assessment & Plan Note (Signed)
H/o sepsis from staph capitis bacteremia due to epidural lumbar abscess s/p spinal cord stimulator removal, and laminectomy with abscess evacuation 05/2022. Completed IV cefazolin abx via PICC line x 6wks, now planned another 6 wks of oral cefazolin, followed by ID.  Will update labs including inflammatory markers and CBC, forward results to ID.

## 2022-07-29 NOTE — Assessment & Plan Note (Signed)
Generalized weakness/deconditioning after prolonged hospitalization with subsequent rehab stay.  Now at ALF, has HHPT involved.  Having difficulty participating in PT due to back pain.  See above for pain management plan.

## 2022-07-29 NOTE — Assessment & Plan Note (Addendum)
Recent EGD while hospitalized showed gastric and duodenal ulcers, treating with BID PPI and carafate x 3 months.  See below re: NSAID plan.  Has GI f/u planned.

## 2022-07-29 NOTE — Assessment & Plan Note (Signed)
BP stable on current regimen.  

## 2022-07-29 NOTE — Assessment & Plan Note (Addendum)
Heart rate irregular today - check EKG. No h/o afib.  EKG not consistent with afib.  He denies cardiac symptoms. He has upcoming cardiology visit scheduled in a few weeks.

## 2022-07-29 NOTE — Assessment & Plan Note (Signed)
Update BMP.

## 2022-07-29 NOTE — Assessment & Plan Note (Signed)
Admission Cr 2.6, this resolved upon discharge.  Update BMP.

## 2022-07-29 NOTE — Assessment & Plan Note (Signed)
S/p removal of stimulator 05/2022

## 2022-07-29 NOTE — Assessment & Plan Note (Signed)
Passed voiding trial, foley removed, now continues flomax and finasteride.  Appreciate urology care.

## 2022-07-31 DIAGNOSIS — I129 Hypertensive chronic kidney disease with stage 1 through stage 4 chronic kidney disease, or unspecified chronic kidney disease: Secondary | ICD-10-CM | POA: Diagnosis not present

## 2022-07-31 DIAGNOSIS — M6259 Muscle wasting and atrophy, not elsewhere classified, multiple sites: Secondary | ICD-10-CM | POA: Diagnosis not present

## 2022-07-31 DIAGNOSIS — M6281 Muscle weakness (generalized): Secondary | ICD-10-CM | POA: Diagnosis not present

## 2022-07-31 DIAGNOSIS — M5136 Other intervertebral disc degeneration, lumbar region: Secondary | ICD-10-CM | POA: Diagnosis not present

## 2022-07-31 DIAGNOSIS — G061 Intraspinal abscess and granuloma: Secondary | ICD-10-CM | POA: Diagnosis not present

## 2022-07-31 DIAGNOSIS — M488X6 Other specified spondylopathies, lumbar region: Secondary | ICD-10-CM | POA: Diagnosis not present

## 2022-08-02 DIAGNOSIS — M488X6 Other specified spondylopathies, lumbar region: Secondary | ICD-10-CM | POA: Diagnosis not present

## 2022-08-02 DIAGNOSIS — M6259 Muscle wasting and atrophy, not elsewhere classified, multiple sites: Secondary | ICD-10-CM | POA: Diagnosis not present

## 2022-08-02 DIAGNOSIS — M6281 Muscle weakness (generalized): Secondary | ICD-10-CM | POA: Diagnosis not present

## 2022-08-02 DIAGNOSIS — M5136 Other intervertebral disc degeneration, lumbar region: Secondary | ICD-10-CM | POA: Diagnosis not present

## 2022-08-02 DIAGNOSIS — G061 Intraspinal abscess and granuloma: Secondary | ICD-10-CM | POA: Diagnosis not present

## 2022-08-02 DIAGNOSIS — I129 Hypertensive chronic kidney disease with stage 1 through stage 4 chronic kidney disease, or unspecified chronic kidney disease: Secondary | ICD-10-CM | POA: Diagnosis not present

## 2022-08-03 DIAGNOSIS — G061 Intraspinal abscess and granuloma: Secondary | ICD-10-CM | POA: Diagnosis not present

## 2022-08-03 DIAGNOSIS — M5136 Other intervertebral disc degeneration, lumbar region: Secondary | ICD-10-CM | POA: Diagnosis not present

## 2022-08-03 DIAGNOSIS — M6281 Muscle weakness (generalized): Secondary | ICD-10-CM | POA: Diagnosis not present

## 2022-08-03 DIAGNOSIS — M6259 Muscle wasting and atrophy, not elsewhere classified, multiple sites: Secondary | ICD-10-CM | POA: Diagnosis not present

## 2022-08-03 DIAGNOSIS — M488X6 Other specified spondylopathies, lumbar region: Secondary | ICD-10-CM | POA: Diagnosis not present

## 2022-08-03 DIAGNOSIS — I129 Hypertensive chronic kidney disease with stage 1 through stage 4 chronic kidney disease, or unspecified chronic kidney disease: Secondary | ICD-10-CM | POA: Diagnosis not present

## 2022-08-04 ENCOUNTER — Telehealth: Payer: Self-pay | Admitting: Family Medicine

## 2022-08-04 NOTE — Telephone Encounter (Signed)
Lattie Haw from [facility] called in stating that the patient had a weight gain. When he moved into [place] he was 247 lbs and now he at 252.6 lbs. They are to notify if patient gains over 3 lbs in a day or 5 lbs in a week. She did state that the patient has been asking for double portions and his son said his baseline is 260 lbs. She stated he hasn't had any swelling, SOB, or any other symptoms. Thank you!

## 2022-08-07 DIAGNOSIS — M6281 Muscle weakness (generalized): Secondary | ICD-10-CM | POA: Diagnosis not present

## 2022-08-07 DIAGNOSIS — M5136 Other intervertebral disc degeneration, lumbar region: Secondary | ICD-10-CM | POA: Diagnosis not present

## 2022-08-07 DIAGNOSIS — I129 Hypertensive chronic kidney disease with stage 1 through stage 4 chronic kidney disease, or unspecified chronic kidney disease: Secondary | ICD-10-CM | POA: Diagnosis not present

## 2022-08-07 DIAGNOSIS — G061 Intraspinal abscess and granuloma: Secondary | ICD-10-CM | POA: Diagnosis not present

## 2022-08-07 DIAGNOSIS — M6259 Muscle wasting and atrophy, not elsewhere classified, multiple sites: Secondary | ICD-10-CM | POA: Diagnosis not present

## 2022-08-07 DIAGNOSIS — M488X6 Other specified spondylopathies, lumbar region: Secondary | ICD-10-CM | POA: Diagnosis not present

## 2022-08-08 DIAGNOSIS — M5136 Other intervertebral disc degeneration, lumbar region: Secondary | ICD-10-CM | POA: Diagnosis not present

## 2022-08-08 DIAGNOSIS — M6281 Muscle weakness (generalized): Secondary | ICD-10-CM | POA: Diagnosis not present

## 2022-08-08 DIAGNOSIS — M488X6 Other specified spondylopathies, lumbar region: Secondary | ICD-10-CM | POA: Diagnosis not present

## 2022-08-08 DIAGNOSIS — I129 Hypertensive chronic kidney disease with stage 1 through stage 4 chronic kidney disease, or unspecified chronic kidney disease: Secondary | ICD-10-CM | POA: Diagnosis not present

## 2022-08-08 DIAGNOSIS — M6259 Muscle wasting and atrophy, not elsewhere classified, multiple sites: Secondary | ICD-10-CM | POA: Diagnosis not present

## 2022-08-08 DIAGNOSIS — G061 Intraspinal abscess and granuloma: Secondary | ICD-10-CM | POA: Diagnosis not present

## 2022-08-08 NOTE — Telephone Encounter (Addendum)
Noted. Doesn't sound like CHF related.

## 2022-08-09 DIAGNOSIS — I129 Hypertensive chronic kidney disease with stage 1 through stage 4 chronic kidney disease, or unspecified chronic kidney disease: Secondary | ICD-10-CM | POA: Diagnosis not present

## 2022-08-09 DIAGNOSIS — M6281 Muscle weakness (generalized): Secondary | ICD-10-CM | POA: Diagnosis not present

## 2022-08-09 DIAGNOSIS — M488X6 Other specified spondylopathies, lumbar region: Secondary | ICD-10-CM | POA: Diagnosis not present

## 2022-08-09 DIAGNOSIS — M6259 Muscle wasting and atrophy, not elsewhere classified, multiple sites: Secondary | ICD-10-CM | POA: Diagnosis not present

## 2022-08-09 DIAGNOSIS — G061 Intraspinal abscess and granuloma: Secondary | ICD-10-CM | POA: Diagnosis not present

## 2022-08-09 DIAGNOSIS — M5136 Other intervertebral disc degeneration, lumbar region: Secondary | ICD-10-CM | POA: Diagnosis not present

## 2022-08-14 ENCOUNTER — Ambulatory Visit: Payer: Medicare Other | Admitting: Family Medicine

## 2022-08-14 ENCOUNTER — Ambulatory Visit (INDEPENDENT_AMBULATORY_CARE_PROVIDER_SITE_OTHER): Payer: Medicare Other | Admitting: Gastroenterology

## 2022-08-14 ENCOUNTER — Encounter: Payer: Self-pay | Admitting: Gastroenterology

## 2022-08-14 VITALS — BP 139/78 | HR 103 | Temp 98.7°F

## 2022-08-14 DIAGNOSIS — M6281 Muscle weakness (generalized): Secondary | ICD-10-CM | POA: Diagnosis not present

## 2022-08-14 DIAGNOSIS — M488X6 Other specified spondylopathies, lumbar region: Secondary | ICD-10-CM | POA: Diagnosis not present

## 2022-08-14 DIAGNOSIS — I2510 Atherosclerotic heart disease of native coronary artery without angina pectoris: Secondary | ICD-10-CM

## 2022-08-14 DIAGNOSIS — M6259 Muscle wasting and atrophy, not elsewhere classified, multiple sites: Secondary | ICD-10-CM | POA: Diagnosis not present

## 2022-08-14 DIAGNOSIS — Z8719 Personal history of other diseases of the digestive system: Secondary | ICD-10-CM | POA: Diagnosis not present

## 2022-08-14 DIAGNOSIS — G061 Intraspinal abscess and granuloma: Secondary | ICD-10-CM | POA: Diagnosis not present

## 2022-08-14 DIAGNOSIS — I129 Hypertensive chronic kidney disease with stage 1 through stage 4 chronic kidney disease, or unspecified chronic kidney disease: Secondary | ICD-10-CM | POA: Diagnosis not present

## 2022-08-14 DIAGNOSIS — M5136 Other intervertebral disc degeneration, lumbar region: Secondary | ICD-10-CM | POA: Diagnosis not present

## 2022-08-14 NOTE — Progress Notes (Signed)
Jonathon Bellows MD, MRCP(U.K) [address]  [place]  [place], [place] [address]  Main: [phone]  Fax: [phone]   Primary Care Physician: Ria Bush, MD  Primary Gastroenterologist:  Dr. Jonathon Bellows   Chief Complaint  Patient presents with   New Patient (Initial Visit)    HPI: Anthony [name] is a 86 y.o. male  He presented to the hospital on 05/29/2022 for Staph bacteremia, was on Plavix and statins for atrial flutter and he had 2 episodes of melena hemoglobin had dropped from 10.9 g to 7.9 g.  Tagged RBC scan was negative it is possible that he had been on NSAIDs  I performed an EGD on 05/30/2022: Small amount of food within the stomach nonbleeding gastric ulcer with clean base seen at the incisura nonbleeding cratered duodenal ulcer 15 mm in size was seen in the duodenal bulb and another ulcer in the third portion of the duodenum was seen.  Commenced on Prilosec for 3 months 40 mg twice daily he was subsequently discharged with a diagnosis of vertebral osteomyelitis  07/26/2022 hemoglobin 11.4 g has improved   Since his discharge he has stopped taking the Plavix.  He is still taking on and off the meloxicam to help him with his mobility on Protonix 40 mg once a day.  Denies any black stools presently.   Current Outpatient Medications  Medication Sig Dispense Refill   acetaminophen (TYLENOL) 500 MG tablet Take 1 tablet (500 mg total) by mouth 2 (two) times daily as needed for moderate pain.     Alirocumab (PRALUENT) 75 MG/ML SOAJ Inject 1 pen. into the skin every 14 (fourteen) days. 2 mL 11   amLODipine (NORVASC) 5 MG tablet TAKE 1 TABLET(5 MG) BY MOUTH DAILY (Patient taking differently: Take 5 mg by mouth daily. TAKE 1 TABLET(5 MG) BY MOUTH DAILY) 90 tablet 2   aspirin EC 81 MG tablet Take 1 tablet (81 mg total) by mouth daily.     cephALEXin (KEFLEX) 500 MG capsule Take 1,000 mg by mouth every 6 (six) hours.     ciclopirox (PENLAC) 8 % solution APPLY TOPICALLY  AT BEDTIME APPLY OVER NAIL AND SURROUNDING SKIN, APPLY DAILY OVER PREVIOUS COAT, AFTER 7 DAYS MAY REMOVE WITH ALCOHOL AND REPEAT 6.6 mL 11   diphenhydramine-acetaminophen (TYLENOL PM) 25-500 MG TABS tablet Take 2 tablets by mouth at bedtime.     docusate sodium (COLACE) 100 MG capsule Take 100 mg by mouth daily.     DULoxetine (CYMBALTA) 20 MG capsule Take 1 capsule (20 mg total) by mouth daily.     Eszopiclone 3 MG TABS Take 1 tablet (3 mg total) by mouth at bedtime as needed. Use sparingly, take immediately before bedtime 30 tablet 0   ezetimibe (ZETIA) 10 MG tablet Take 1 tablet (10 mg total) by mouth daily. 90 tablet 3   finasteride (PROSCAR) 5 MG tablet TAKE 1 TABLET(5 MG) BY MOUTH DAILY 90 tablet 0   fluocinolone (SYNALAR) 0.025 % cream Apply topically 2 (two) times daily as needed.     furosemide (LASIX) 40 MG tablet TAKE 1 TABLET BY MOUTH EVERY DAY. TAKE AN EXTRA TABLET AS NEEDED FOR WEIGHT GAIN OF 3 LBS/DAY OR 5LBS/WEEK 135 tablet 3   gabapentin (NEURONTIN) 300 MG capsule TAKE 2 CAPSULE BY MOUTH AT BEDTIME WITH EXTRA CAPSULE DURING THE DAY AS NEEDED 200 capsule 1   icosapent Ethyl (VASCEPA) 1 g capsule Take 2 capsules (2 g total) by mouth 2 (two) times daily.  360 capsule 3   ketoconazole (NIZORAL) 2 % cream APPLY TO FEET AND FACE AT BEDTIME AS DIRECTED 60 g 3   ketoconazole (NIZORAL) 2 % shampoo APPLY TOPICALLY 3 TIMES WEEKLY AS DIRECTED 120 mL 2   levothyroxine (SYNTHROID) 175 MCG tablet Take 1 tablet (175 mcg total) by mouth daily before breakfast. 90 tablet 1   LINZESS 290 MCG CAPS capsule TAKE 1 CAPSULE(290 MCG) BY MOUTH DAILY BEFORE BREAKFAST 90 capsule 1   Melatonin 10 MG CAPS Take 10 mg by mouth at bedtime.     meloxicam (MOBIC) 7.5 MG tablet Take 1 tablet (7.5 mg total) by mouth daily.     mometasone (ELOCON) 0.1 % cream APPLY TOPICALLY TO THE AFFECTED AREA DAILY AS NEEDED FOR RASH 15 g 0   mometasone (ELOCON) 0.1 % lotion APPLY TO SCALP AT BEDTIME ON MONDAY, WEDNESDAY, AND FRIDAY  60 mL 3   Multiple Vitamins-Minerals (CENTRUM SILVER PO) Take 1 tablet by mouth daily.     Multiple Vitamins-Minerals (PRESERVISION AREDS 2 PO) Take 1 tablet by mouth daily.     nitroGLYCERIN (NITROSTAT) 0.4 MG SL tablet Place 1 tablet (0.4 mg total) under the tongue every 5 (five) minutes as needed for chest pain. 25 tablet 0   ondansetron (ZOFRAN) 4 MG tablet Take 4 mg by mouth every 6 (six) hours as needed for nausea.     Oxycodone HCl 10 MG TABS Take 10 mg by mouth every 4 (four) hours as needed.     pantoprazole (PROTONIX) 40 MG tablet Take 1 tablet (40 mg total) by mouth 2 (two) times daily. 30 tablet 1   polyethylene glycol (MIRALAX / GLYCOLAX) 17 g packet Take 17 g by mouth daily.     senna (SENOKOT) 8.6 MG tablet Take 1 tablet (8.6 mg total) by mouth daily. 30 tablet 3   sucralfate (CARAFATE) 1 g tablet Take 1 g by mouth 2 (two) times daily.     tamsulosin (FLOMAX) 0.4 MG CAPS capsule Take 1 capsule (0.4 mg total) by mouth daily. 90 capsule 3   Vitamin D, Cholecalciferol, 25 MCG (1000 UT) TABS Take 1 tablet by mouth daily.     No current facility-administered medications for this visit.    Allergies as of 08/14/2022 - Review Complete 08/14/2022  Allergen Reaction Noted   Questran [cholestyramine]  04/09/2017   Atorvastatin  04/09/2017   Gemfibrozil Other (See Comments) 04/09/2017   Metformin and related  04/09/2017   Rosuvastatin  04/09/2017    ROS:  General: Negative for anorexia, weight loss, fever, chills, fatigue, weakness. ENT: Negative for hoarseness, difficulty swallowing , nasal congestion. CV: Negative for chest pain, angina, palpitations, dyspnea on exertion, peripheral edema.  Respiratory: Negative for dyspnea at rest, dyspnea on exertion, cough, sputum, wheezing.  GI: See history of present illness. GU:  Negative for dysuria, hematuria, urinary incontinence, urinary frequency, nocturnal urination.  Endo: Negative for unusual weight change.    Physical  Examination:   BP 139/78   Pulse (!) 103   Temp 98.7 F (37.1 C) (Oral)   General: Well-nourished, well-developed in no acute distress.  Eyes: No icterus. Conjunctivae pink. Skin: Warm and dry, no jaundice.   Psych: Alert and cooperative, normal mood and affect.   Imaging Studies: No results found.  Assessment and Plan:   Anthony [name] is a 86 y.o. y/o male here to see me as a hospital follow-up, back in October 2023 he was admitted with a GI bleed in addition to vertebral osteomyelitis  it appears prior to the admission he had been taking some NSAIDs for his back pain.  On endoscopy we found a nonbleeding superficial ulcers in the stomach and particularly in the duodenum.  Put him on high-dose PPI for a few weeks and presently taking Protonix 40 mg once a day hemoglobin has improved no further black stools.  He has been taking meloxicam low-dose on and off recently to help with his mobility.  Plan 1.  No NSAIDs as much as possible, I believe it is helping him a lot with his mobility and if absolutely need to be taken then take it very sparingly and ensure you are on Protonix long-term 40 mg till the issue with the back problem has been resolved and he can safely get off NSAIDs.  2.  Ideally I would like to test him for H. pylori off all PPI but I do not believe that he will be off PPI anytime soon hence we will go ahead and test him although less sensitive a positive test would indicate an active infection and we could treat him to reduce the risk of an ulcer  Dr Jonathon Bellows  MD,MRCP [facility]) Follow up in as needed

## 2022-08-14 NOTE — Patient Instructions (Signed)
If you have any questions or concerns, please do not hesitate to give [name] a call.  Merry Christmas and a Happy New Year!

## 2022-08-15 LAB — H. PYLORI BREATH TEST: H pylori Breath Test: NEGATIVE

## 2022-08-16 DIAGNOSIS — K2100 Gastro-esophageal reflux disease with esophagitis, without bleeding: Secondary | ICD-10-CM

## 2022-08-16 DIAGNOSIS — M488X6 Other specified spondylopathies, lumbar region: Secondary | ICD-10-CM | POA: Diagnosis not present

## 2022-08-16 DIAGNOSIS — G4700 Insomnia, unspecified: Secondary | ICD-10-CM

## 2022-08-16 DIAGNOSIS — N183 Chronic kidney disease, stage 3 unspecified: Secondary | ICD-10-CM | POA: Diagnosis not present

## 2022-08-16 DIAGNOSIS — M5136 Other intervertebral disc degeneration, lumbar region: Secondary | ICD-10-CM

## 2022-08-16 DIAGNOSIS — I2510 Atherosclerotic heart disease of native coronary artery without angina pectoris: Secondary | ICD-10-CM

## 2022-08-16 DIAGNOSIS — Z87440 Personal history of urinary (tract) infections: Secondary | ICD-10-CM

## 2022-08-16 DIAGNOSIS — M6281 Muscle weakness (generalized): Secondary | ICD-10-CM

## 2022-08-16 DIAGNOSIS — K5900 Constipation, unspecified: Secondary | ICD-10-CM

## 2022-08-16 DIAGNOSIS — Z9181 History of falling: Secondary | ICD-10-CM

## 2022-08-16 DIAGNOSIS — E785 Hyperlipidemia, unspecified: Secondary | ICD-10-CM

## 2022-08-16 DIAGNOSIS — Z6833 Body mass index (BMI) 33.0-33.9, adult: Secondary | ICD-10-CM

## 2022-08-16 DIAGNOSIS — I129 Hypertensive chronic kidney disease with stage 1 through stage 4 chronic kidney disease, or unspecified chronic kidney disease: Secondary | ICD-10-CM

## 2022-08-16 DIAGNOSIS — G4733 Obstructive sleep apnea (adult) (pediatric): Secondary | ICD-10-CM

## 2022-08-16 DIAGNOSIS — M6259 Muscle wasting and atrophy, not elsewhere classified, multiple sites: Secondary | ICD-10-CM

## 2022-08-16 DIAGNOSIS — E039 Hypothyroidism, unspecified: Secondary | ICD-10-CM

## 2022-08-16 DIAGNOSIS — N419 Inflammatory disease of prostate, unspecified: Secondary | ICD-10-CM

## 2022-08-16 DIAGNOSIS — G061 Intraspinal abscess and granuloma: Secondary | ICD-10-CM

## 2022-08-22 DIAGNOSIS — K5900 Constipation, unspecified: Secondary | ICD-10-CM | POA: Diagnosis not present

## 2022-08-22 DIAGNOSIS — Z6833 Body mass index (BMI) 33.0-33.9, adult: Secondary | ICD-10-CM | POA: Diagnosis not present

## 2022-08-22 DIAGNOSIS — K2100 Gastro-esophageal reflux disease with esophagitis, without bleeding: Secondary | ICD-10-CM | POA: Diagnosis not present

## 2022-08-22 DIAGNOSIS — M6281 Muscle weakness (generalized): Secondary | ICD-10-CM | POA: Diagnosis not present

## 2022-08-22 DIAGNOSIS — I129 Hypertensive chronic kidney disease with stage 1 through stage 4 chronic kidney disease, or unspecified chronic kidney disease: Secondary | ICD-10-CM | POA: Diagnosis not present

## 2022-08-22 DIAGNOSIS — E039 Hypothyroidism, unspecified: Secondary | ICD-10-CM | POA: Diagnosis not present

## 2022-08-22 DIAGNOSIS — N183 Chronic kidney disease, stage 3 unspecified: Secondary | ICD-10-CM | POA: Diagnosis not present

## 2022-08-22 DIAGNOSIS — N419 Inflammatory disease of prostate, unspecified: Secondary | ICD-10-CM | POA: Diagnosis not present

## 2022-08-22 DIAGNOSIS — I2510 Atherosclerotic heart disease of native coronary artery without angina pectoris: Secondary | ICD-10-CM | POA: Diagnosis not present

## 2022-08-22 DIAGNOSIS — E785 Hyperlipidemia, unspecified: Secondary | ICD-10-CM | POA: Diagnosis not present

## 2022-08-22 DIAGNOSIS — G4733 Obstructive sleep apnea (adult) (pediatric): Secondary | ICD-10-CM | POA: Diagnosis not present

## 2022-08-22 DIAGNOSIS — Z87440 Personal history of urinary (tract) infections: Secondary | ICD-10-CM | POA: Diagnosis not present

## 2022-08-22 DIAGNOSIS — G061 Intraspinal abscess and granuloma: Secondary | ICD-10-CM | POA: Diagnosis not present

## 2022-08-22 DIAGNOSIS — Z9181 History of falling: Secondary | ICD-10-CM | POA: Diagnosis not present

## 2022-08-22 DIAGNOSIS — G4700 Insomnia, unspecified: Secondary | ICD-10-CM | POA: Diagnosis not present

## 2022-08-22 DIAGNOSIS — M488X6 Other specified spondylopathies, lumbar region: Secondary | ICD-10-CM | POA: Diagnosis not present

## 2022-08-22 DIAGNOSIS — M6259 Muscle wasting and atrophy, not elsewhere classified, multiple sites: Secondary | ICD-10-CM | POA: Diagnosis not present

## 2022-08-22 DIAGNOSIS — M5136 Other intervertebral disc degeneration, lumbar region: Secondary | ICD-10-CM | POA: Diagnosis not present

## 2022-08-24 ENCOUNTER — Ambulatory Visit: Payer: Medicare Other | Attending: Internal Medicine | Admitting: Internal Medicine

## 2022-08-24 ENCOUNTER — Encounter: Payer: Self-pay | Admitting: Internal Medicine

## 2022-08-24 VITALS — BP 130/72 | HR 101 | Ht 72.0 in | Wt 252.1 lb

## 2022-08-24 DIAGNOSIS — I4892 Unspecified atrial flutter: Secondary | ICD-10-CM | POA: Diagnosis not present

## 2022-08-24 DIAGNOSIS — I10 Essential (primary) hypertension: Secondary | ICD-10-CM | POA: Insufficient documentation

## 2022-08-24 DIAGNOSIS — I2510 Atherosclerotic heart disease of native coronary artery without angina pectoris: Secondary | ICD-10-CM | POA: Diagnosis not present

## 2022-08-24 DIAGNOSIS — I5030 Unspecified diastolic (congestive) heart failure: Secondary | ICD-10-CM | POA: Diagnosis not present

## 2022-08-24 DIAGNOSIS — E782 Mixed hyperlipidemia: Secondary | ICD-10-CM | POA: Diagnosis not present

## 2022-08-24 MED ORDER — BISOPROLOL FUMARATE 5 MG PO TABS: 2.5000 mg | ORAL_TABLET | Freq: Every day | ORAL | 0 refills | Status: DC

## 2022-08-24 MED ORDER — NEBIVOLOL HCL 2.5 MG PO TABS: 2.5000 mg | ORAL_TABLET | Freq: Every day | ORAL | 0 refills | Status: DC

## 2022-08-24 MED ORDER — ATENOLOL 25 MG PO TABS: ORAL_TABLET | ORAL | 0 refills | Status: DC

## 2022-08-24 MED ORDER — METOPROLOL SUCCINATE ER 25 MG PO TB24: 25.0000 mg | ORAL_TABLET | Freq: Every day | ORAL | 0 refills | Status: DC

## 2022-08-24 NOTE — Patient Instructions (Addendum)
Medication Instructions:  - Your physician has recommended you make the following change in your medication:    1) STOP Amlodipine.    2) You have been given 4 different beta blockers to try. You may take them in any order, but DO NOT take more than 1 at a time. Please try to give at least 2 weeks on each medication to see what works the best for you. If you decide which 1 you like, please call the office at [phone] so we may send in additional refills for you.  Atenolol 25 mg take one tablet daily   Bisoprolol 5 mg take 0.5 tablet (2.5 mg) once daily.   Metoprolol succinate 25 mg take one tablet daily.   Nebivolol 2.5 mg take one tablet daily.    *If you need a refill on your cardiac medications before your next appointment, please call your pharmacy*   Lab Work: None   If you have labs (blood work) drawn today and your tests are completely normal, you will receive your results only by: Ethel (if you have MyChart) OR A paper copy in the mail If you have any lab test that is abnormal or we need to change your treatment, we will call you to review the results.   Testing/Procedures: None     Follow-Up: At [facility], you and your health needs are our priority.  As part of our continuing mission to provide you with exceptional heart care, we have created designated Provider Care Teams.  These Care Teams include your primary Cardiologist (physician) and Advanced Practice Providers (APPs -  Physician Assistants and Nurse Practitioners) who all work together to provide you with the care you need, when you need it.  We recommend signing up for the patient portal called "MyChart".  Sign up information is provided on this After Visit Summary.  MyChart is used to connect with patients for Virtual Visits (Telemedicine).  Patients are able to view lab/test results, encounter notes, upcoming appointments, etc.  Non-urgent messages can be sent to your provider as  well.   To learn more about what you can do with MyChart, go to [email or web address].    Your next appointment:   12 month(s)  The format for your next appointment:   In Person  Provider:   Virl Axe, MD     Important Information About Sugar

## 2022-08-24 NOTE — Progress Notes (Signed)
Patient Care Team: Ria Bush, MD as PCP - General (Family Medicine) Gillis Santa, MD as Consulting Physician (Pain Medicine) Deboraha Sprang, MD as Consulting Physician (Cardiology) Charlton Haws, [facility] as Pharmacist (Pharmacist)   HPI  Anthony [name] is a 86 y.o. male is the  father of [name] with a history of atrial flutter for which he underwent catheter ablation 9/18.   History of coronary artery disease with prior stenting of his LAD and his RCA in 2013.    Blood hyperlipidemia and was referred to the lipid clinic and was started on a PCSK9 inhibitor  The patient denies, shortness of breath, nocturnal dyspnea, orthopnea or peripheral edema.  There have been no palpitations, lightheadedness or syncope.  Complains of chest pain.  Nonexertional.  Duration seconds 20-40  Reviewing his history of his stent, both were accomplished without antecedent symptoms because of findings on imaging testing.    Hospitalized 10/23 w Coag Neg St Aureus bacteremia assoc with abscess in epidural space    The patient denies chest pain, shortness of breath, nocturnal dyspnea, orthopnea or peripheral edema.  There have been no palpitations, lightheadedness or syncope.  But is limited in mobility 2/2 pain and weakness although now he is walking better with rehab.  The hope is he can be discharged home from rehab  Date Cr K Hgb LDL  9/18   14.4   2/19 1.45 4.3     9/19  1.25 3.8 14.0   4/20 1.22 4.0    8/21 1.8 4.2 13.2   5/22 1.44 4.0    11/23 1.03 4.0 11.4 47 (8/23)   DATE TEST EF   6/18 Echo   55-65 %   4/19 MYOVIEW  57 % Non Ischemic  9/23 Echo  55%       Past Medical History:  Diagnosis Date   Anosmia 1980s   Atrial flutter ([place]) 2018   had an ablation with dr. Erick Alley   Basal cell carcinoma 01/11/2022   L cheek - excised 04/25/22   CAD (coronary artery disease)    Cancer (HCC)    skin cancer   Chronic constipation    Chronic insomnia    Chronic  lower back pain    s/p spine stimulator placement   Complication of anesthesia    when under general, he woke up agitated and unable to be held down   Diastolic CHF ([place])    History of diverticulitis 2017   History of pneumonia 2013   Hyperlipidemia    Hypertension    Hypothyroidism    Irregular heart rhythm 07/29/2022   Obesity, Class II, BMI 35-39.9, with comorbidity    OSA on CPAP    PONV (postoperative nausea and vomiting)     Past Surgical History:  Procedure Laterality Date   A-FLUTTER ABLATION N/A 05/07/2017   Procedure: A-Flutter Ablation;  Surgeon: Deboraha Sprang, MD;  Location: [place] [facility];  Service: Cardiovascular;  Laterality: N/A;   CARDIOVASCULAR STRESS TEST  11/2017   no ischemia, low risk study   COLONOSCOPY  2007   normal per prior PCP records, rpt 10 yrs (Dr Osie Cheeks)   [place] Right    ulnar nerve decompression.  PT DOES NOT RECALL THIS PROCEDURE   ENDOVASCULAR REPAIR/STENT GRAFT N/A 05/05/2020   Procedure: ENDOVASCULAR REPAIR/STENT GRAFT;  Surgeon: Algernon Huxley, MD;  Location: [place] [facility];  Service: Cardiovascular;  Laterality: N/A;   ESOPHAGOGASTRODUODENOSCOPY (EGD) WITH  PROPOFOL N/A 05/30/2022   Procedure: ESOPHAGOGASTRODUODENOSCOPY (EGD) WITH PROPOFOL;  Surgeon: Jonathon Bellows, MD;  Location: [facility] ENDOSCOPY;  Service: Gastroenterology;  Laterality: N/A;   LAMINECTOMY THORACIC SPINE W/ PLACEMENT SPINAL CORD STIMULATOR  08/2018   and removal of prior spine stimulator (Dr Lacinda Axon)   LUMBAR LAMINECTOMY FOR EPIDURAL ABSCESS Bilateral 06/05/2022   Procedure: LUMBAR 2- L3, L5-S1 LAMINECTOMY FOR EPIDURAL ABSCESS;  Surgeon: Meade Maw, MD;  Location: [facility] ORS;  Service: Neurosurgery;  Laterality: Bilateral;   NASAL SINUS SURGERY     nasal polyps. done a long time ago   [place] (PCI-S)  2013   EF55%, 70% mid LAD, 99% mid RCA, mild MR, elev LVEDP, DES to mid LAD. RCA is nondominant   RADIOFREQUENCY ABLATION   06/2017   lumbar region [facility])    REPLACEMENT TOTAL KNEE BILATERAL Bilateral 2000s   SPINAL CORD STIMULATOR IMPLANT  06/2016   SPINAL CORD STIMULATOR INSERTION N/A 09/09/2018   Procedure: LUMBAR SPINAL CORD STIMULATOR LEAD AND BATTERY REMOVAL AND LAMINECTOMY FOR PLACEMENT OF PADDLE;  Surgeon: Deetta Perla, MD;  Location: [facility] ORS;  Service: Neurosurgery;  Laterality: N/A;   SPINAL CORD STIMULATOR INSERTION N/A 09/16/2018   Procedure: PLACEMENT OF SPINAL CORD STIMULATOR BATTERY;  Surgeon: Deetta Perla, MD;  Location: [facility] ORS;  Service: Neurosurgery;  Laterality: N/A;   SPINAL CORD STIMULATOR REMOVAL N/A 06/03/2022   Procedure: LUMBAR SPINAL CORD STIMULATOR REMOVAL;  Surgeon: Meade Maw, MD;  Location: [facility] ORS;  Service: Neurosurgery;  Laterality: N/A;    Current Meds  Medication Sig   acetaminophen (TYLENOL) 500 MG tablet Take 1 tablet (500 mg total) by mouth 2 (two) times daily as needed for moderate pain.   Alirocumab (PRALUENT) 75 MG/ML SOAJ Inject 1 pen. into the skin every 14 (fourteen) days.   amLODipine (NORVASC) 5 MG tablet TAKE 1 TABLET(5 MG) BY MOUTH DAILY   aspirin EC 81 MG tablet Take 1 tablet (81 mg total) by mouth daily.   cephALEXin (KEFLEX) 500 MG capsule Take 1,000 mg by mouth every 6 (six) hours.   ciclopirox (PENLAC) 8 % solution APPLY TOPICALLY AT BEDTIME APPLY OVER NAIL AND SURROUNDING SKIN, APPLY DAILY OVER PREVIOUS COAT, AFTER 7 DAYS MAY REMOVE WITH ALCOHOL AND REPEAT   diphenhydramine-acetaminophen (TYLENOL PM) 25-500 MG TABS tablet Take 2 tablets by mouth at bedtime.   docusate sodium (COLACE) 100 MG capsule Take 100 mg by mouth daily.   DULoxetine (CYMBALTA) 20 MG capsule Take 1 capsule (20 mg total) by mouth daily.   Eszopiclone 3 MG TABS Take 1 tablet (3 mg total) by mouth at bedtime as needed. Use sparingly, take immediately before bedtime   ezetimibe (ZETIA) 10 MG tablet Take 1 tablet (10 mg total) by mouth daily.   finasteride (PROSCAR) 5 MG tablet  TAKE 1 TABLET(5 MG) BY MOUTH DAILY   fluocinolone (SYNALAR) 0.025 % cream Apply topically 2 (two) times daily as needed.   furosemide (LASIX) 40 MG tablet TAKE 1 TABLET BY MOUTH EVERY DAY. TAKE AN EXTRA TABLET AS NEEDED FOR WEIGHT GAIN OF 3 LBS/DAY OR 5LBS/WEEK   gabapentin (NEURONTIN) 300 MG capsule TAKE 2 CAPSULE BY MOUTH AT BEDTIME WITH EXTRA CAPSULE DURING THE DAY AS NEEDED   icosapent Ethyl (VASCEPA) 1 g capsule Take 2 capsules (2 g total) by mouth 2 (two) times daily.   ketoconazole (NIZORAL) 2 % cream APPLY TO FEET AND FACE AT BEDTIME AS DIRECTED   ketoconazole (NIZORAL) 2 % shampoo APPLY TOPICALLY 3 TIMES WEEKLY AS  DIRECTED   levothyroxine (SYNTHROID) 175 MCG tablet Take 1 tablet (175 mcg total) by mouth daily before breakfast.   LINZESS 290 MCG CAPS capsule TAKE 1 CAPSULE(290 MCG) BY MOUTH DAILY BEFORE BREAKFAST   Melatonin 10 MG CAPS Take 10 mg by mouth at bedtime.   mometasone (ELOCON) 0.1 % lotion APPLY TO SCALP AT BEDTIME ON MONDAY, WEDNESDAY, AND FRIDAY   Multiple Vitamins-Minerals (CENTRUM SILVER PO) Take 1 tablet by mouth daily.   Multiple Vitamins-Minerals (PRESERVISION AREDS 2 PO) Take 1 tablet by mouth daily.   nitroGLYCERIN (NITROSTAT) 0.4 MG SL tablet Place 1 tablet (0.4 mg total) under the tongue every 5 (five) minutes as needed for chest pain.   ondansetron (ZOFRAN) 4 MG tablet Take 4 mg by mouth every 6 (six) hours as needed for nausea.   Oxycodone HCl 10 MG TABS Take 10 mg by mouth every 4 (four) hours as needed.   pantoprazole (PROTONIX) 40 MG tablet Take 1 tablet (40 mg total) by mouth 2 (two) times daily.   polyethylene glycol (MIRALAX / GLYCOLAX) 17 g packet Take 17 g by mouth daily.   senna (SENOKOT) 8.6 MG tablet Take 1 tablet (8.6 mg total) by mouth daily.   sucralfate (CARAFATE) 1 g tablet Take 1 g by mouth 2 (two) times daily.   tamsulosin (FLOMAX) 0.4 MG CAPS capsule Take 1 capsule (0.4 mg total) by mouth daily.   Vitamin D, Cholecalciferol, 25 MCG (1000 UT)  TABS Take 1 tablet by mouth daily.    Allergies  Allergen Reactions   Questran [Cholestyramine]     Patient not aware of an allergy to this medicine.   Atorvastatin     Muscle pain   Gemfibrozil Other (See Comments)    Muscle pain.   Metformin And Related     Dizziness   Rosuvastatin     Muscle pain      Review of Systems negative except from HPI and PMH  Physical Exam BP 130/72 (BP Location: Left Arm, Patient Position: Sitting, Cuff Size: Normal)   Pulse (!) 101   Ht 6' (1.829 m)   Wt 252 lb 2 oz (114.4 kg)   SpO2 95%   BMI 34.19 kg/m  Well developed and nourished in no acute distress HENT normal Neck supple  Clear Rapid and irregular rate and rhythm, no murmurs or gallops Abd-soft with active BS No Clubbing cyanosis edema Skin-warm and dry A & Oriented  Grossly normal sensory and motor function  ECG sinus at 101 with atrial ectopics   Assessment and Plan:  Atrial flutter-typical  S/p ablation  HFpEF  Statin intolerance  Sinus tachycardia  Renal insufficiency grade 3  Hyperlipidemia  Coronary artery disease with prior stenting  Morbid obesity  Abdominal aortic aneurysm status post endovascular repair  Interval coag negative staph bacteremia     No angina, continue aspirin  \With his dyslipidemia, he will stay on Praluent; LDL is at goal  Blood pressure is reasonably controlled, sinus tachycardia is an issue.  We will try and transition from amlodipine to a beta-blocker.  I have given him prescriptions for atenolol 25 bisoprolol 2.5 nebivolol 2.5 metoprolol succinate 25  No interval atrial fibrillation of which she is aware         Virl Axe

## 2022-08-29 DIAGNOSIS — M6259 Muscle wasting and atrophy, not elsewhere classified, multiple sites: Secondary | ICD-10-CM | POA: Diagnosis not present

## 2022-08-29 DIAGNOSIS — G061 Intraspinal abscess and granuloma: Secondary | ICD-10-CM | POA: Diagnosis not present

## 2022-08-29 DIAGNOSIS — I129 Hypertensive chronic kidney disease with stage 1 through stage 4 chronic kidney disease, or unspecified chronic kidney disease: Secondary | ICD-10-CM | POA: Diagnosis not present

## 2022-08-29 DIAGNOSIS — M5136 Other intervertebral disc degeneration, lumbar region: Secondary | ICD-10-CM | POA: Diagnosis not present

## 2022-08-29 DIAGNOSIS — M488X6 Other specified spondylopathies, lumbar region: Secondary | ICD-10-CM | POA: Diagnosis not present

## 2022-08-29 DIAGNOSIS — M6281 Muscle weakness (generalized): Secondary | ICD-10-CM | POA: Diagnosis not present

## 2022-08-30 ENCOUNTER — Encounter: Payer: Self-pay | Admitting: Family Medicine

## 2022-08-30 ENCOUNTER — Ambulatory Visit (INDEPENDENT_AMBULATORY_CARE_PROVIDER_SITE_OTHER): Payer: Medicare Other | Admitting: Family Medicine

## 2022-08-30 VITALS — BP 122/82 | HR 104 | Ht 72.0 in | Wt 254.0 lb

## 2022-08-30 DIAGNOSIS — R413 Other amnesia: Secondary | ICD-10-CM | POA: Diagnosis not present

## 2022-08-30 DIAGNOSIS — K279 Peptic ulcer, site unspecified, unspecified as acute or chronic, without hemorrhage or perforation: Secondary | ICD-10-CM | POA: Diagnosis not present

## 2022-08-30 DIAGNOSIS — M545 Low back pain, unspecified: Secondary | ICD-10-CM

## 2022-08-30 DIAGNOSIS — M4620 Osteomyelitis of vertebra, site unspecified: Secondary | ICD-10-CM

## 2022-08-30 DIAGNOSIS — R531 Weakness: Secondary | ICD-10-CM

## 2022-08-30 DIAGNOSIS — G8929 Other chronic pain: Secondary | ICD-10-CM

## 2022-08-30 DIAGNOSIS — I10 Essential (primary) hypertension: Secondary | ICD-10-CM | POA: Diagnosis not present

## 2022-08-30 DIAGNOSIS — E66811 Obesity, class 1: Secondary | ICD-10-CM

## 2022-08-30 DIAGNOSIS — G061 Intraspinal abscess and granuloma: Secondary | ICD-10-CM | POA: Diagnosis not present

## 2022-08-30 DIAGNOSIS — N1831 Chronic kidney disease, stage 3a: Secondary | ICD-10-CM | POA: Diagnosis not present

## 2022-08-30 DIAGNOSIS — R7303 Prediabetes: Secondary | ICD-10-CM | POA: Diagnosis not present

## 2022-08-30 DIAGNOSIS — T85192S Other mechanical complication of implanted electronic neurostimulator (electrode) of spinal cord, sequela: Secondary | ICD-10-CM | POA: Diagnosis not present

## 2022-08-30 DIAGNOSIS — E669 Obesity, unspecified: Secondary | ICD-10-CM

## 2022-08-30 NOTE — Patient Instructions (Addendum)
Labs today Stop amlodipine, start toprol XL '25mg'$  daily in its place Drop pantoprazole to '40mg'$  once daily  Return in 3 months for follow up visit.

## 2022-08-30 NOTE — Progress Notes (Signed)
Patient ID: Anthony [name], male    DOB: 1936/05/22, 87 y.o.   MRN: [id number]  This visit was conducted in person.  BP 122/82   Pulse (!) 104   Ht 6' (1.829 m)   Wt 254 lb (115.2 kg)   SpO2 94%   BMI 34.45 kg/m   BP Readings from Last 3 Encounters:  08/30/22 122/82  08/24/22 130/72  08/14/22 139/78    CC: f/u visit  Subjective:   HPI: Anthony [name] is a 87 y.o. male presenting on 08/30/2022 for Follow-up Here with son today [name]   See prior note for details.  Complicated hospitalization 04/2022 for CONS bacteremia from epidural abscess s/p removal of spinal cord stimulator. Completed 6 wks of IV cefazolin followed by 6 wks of oral cefazolin (continues on this). Has ID f/u next week. Hospital stay complicated by urinary retention and upper GIB, EGD showed gastric and duodenal ulcers, started on protonix BID and sucralfate BID. [facility] to [facility] for 6 wks, then to [facility] where he now stays.   Given uncontrolled chronic pain, after reviewing risks vs benefits, they decided to restart meloxicam 7.'5mg'$  daily, and continue gabapentin '600mg'$  nightly, oxycodone '10mg'$  Q4 hours. We also restarted cymbalta '20mg'$  daily. Not regularly taking oxycodone - about once a week. Has started using lidocaine patches daily with benefit.   Last visit we also sent request for triple electrotherapy device [employer] (interferential, TENS, NMES). This is still pending approval.   Saw GI - continue protonix once daily. H pylori breath test negative. He recommended sparing meloxicam use.   Saw EP Dr Caryl Comes - stopped amlodipine, rec try different BB to find optimal choice. They've decided to try Toprol XL '25mg'$  daily.   Son notes ongoing trouble with short term memory - ie can't remember what he had for lunch today, calling son with same question repeatedly.      Relevant past medical, surgical, family and social history reviewed and updated as indicated. Interim medical  history since our last visit reviewed. Allergies and medications reviewed and updated. Outpatient Medications Prior to Visit  Medication Sig Dispense Refill   acetaminophen (TYLENOL) 500 MG tablet Take 1 tablet (500 mg total) by mouth 2 (two) times daily as needed for moderate pain.     Alirocumab (PRALUENT) 75 MG/ML SOAJ Inject 1 pen. into the skin every 14 (fourteen) days. 2 mL 11   aspirin EC 81 MG tablet Take 1 tablet (81 mg total) by mouth daily.     cephALEXin (KEFLEX) 500 MG capsule Take 1,000 mg by mouth every 6 (six) hours.     ciclopirox (PENLAC) 8 % solution APPLY TOPICALLY AT BEDTIME APPLY OVER NAIL AND SURROUNDING SKIN, APPLY DAILY OVER PREVIOUS COAT, AFTER 7 DAYS MAY REMOVE WITH ALCOHOL AND REPEAT 6.6 mL 11   diphenhydramine-acetaminophen (TYLENOL PM) 25-500 MG TABS tablet Take 2 tablets by mouth at bedtime.     docusate sodium (COLACE) 100 MG capsule Take 100 mg by mouth daily.     DULoxetine (CYMBALTA) 20 MG capsule Take 1 capsule (20 mg total) by mouth daily.     Eszopiclone 3 MG TABS Take 1 tablet (3 mg total) by mouth at bedtime as needed. Use sparingly, take immediately before bedtime 30 tablet 0   ezetimibe (ZETIA) 10 MG tablet Take 1 tablet (10 mg total) by mouth daily. 90 tablet 3   finasteride (PROSCAR) 5 MG tablet TAKE 1 TABLET(5 MG) BY MOUTH DAILY 90 tablet  0   fluocinolone (SYNALAR) 0.025 % cream Apply topically 2 (two) times daily as needed.     furosemide (LASIX) 40 MG tablet TAKE 1 TABLET BY MOUTH EVERY DAY. TAKE AN EXTRA TABLET AS NEEDED FOR WEIGHT GAIN OF 3 LBS/DAY OR 5LBS/WEEK 135 tablet 3   gabapentin (NEURONTIN) 300 MG capsule TAKE 2 CAPSULE BY MOUTH AT BEDTIME WITH EXTRA CAPSULE DURING THE DAY AS NEEDED 200 capsule 1   icosapent Ethyl (VASCEPA) 1 g capsule Take 2 capsules (2 g total) by mouth 2 (two) times daily. 360 capsule 3   ketoconazole (NIZORAL) 2 % cream APPLY TO FEET AND FACE AT BEDTIME AS DIRECTED 60 g 3   ketoconazole (NIZORAL) 2 % shampoo APPLY  TOPICALLY 3 TIMES WEEKLY AS DIRECTED 120 mL 2   levothyroxine (SYNTHROID) 175 MCG tablet Take 1 tablet (175 mcg total) by mouth daily before breakfast. 90 tablet 1   LINZESS 290 MCG CAPS capsule TAKE 1 CAPSULE(290 MCG) BY MOUTH DAILY BEFORE BREAKFAST 90 capsule 1   Melatonin 10 MG CAPS Take 10 mg by mouth at bedtime.     meloxicam (MOBIC) 7.5 MG tablet Take 7.5 mg by mouth daily.     mometasone (ELOCON) 0.1 % lotion APPLY TO SCALP AT BEDTIME ON MONDAY, WEDNESDAY, AND FRIDAY 60 mL 3   Multiple Vitamins-Minerals (CENTRUM SILVER PO) Take 1 tablet by mouth daily.     Multiple Vitamins-Minerals (PRESERVISION AREDS 2 PO) Take 1 tablet by mouth daily.     nitroGLYCERIN (NITROSTAT) 0.4 MG SL tablet Place 1 tablet (0.4 mg total) under the tongue every 5 (five) minutes as needed for chest pain. 25 tablet 0   ondansetron (ZOFRAN) 4 MG tablet Take 4 mg by mouth every 6 (six) hours as needed for nausea.     Oxycodone HCl 10 MG TABS Take 10 mg by mouth every 4 (four) hours as needed.     polyethylene glycol (MIRALAX / GLYCOLAX) 17 g packet Take 17 g by mouth daily.     senna (SENOKOT) 8.6 MG tablet Take 1 tablet (8.6 mg total) by mouth daily. 30 tablet 3   sucralfate (CARAFATE) 1 g tablet Take 1 g by mouth 2 (two) times daily.     tamsulosin (FLOMAX) 0.4 MG CAPS capsule Take 1 capsule (0.4 mg total) by mouth daily. 90 capsule 3   Vitamin D, Cholecalciferol, 25 MCG (1000 UT) TABS Take 1 tablet by mouth daily.     amLODipine (NORVASC) 5 MG tablet Take 5 mg by mouth daily.     pantoprazole (PROTONIX) 40 MG tablet Take 1 tablet (40 mg total) by mouth 2 (two) times daily. 30 tablet 1   metoprolol succinate (TOPROL-XL) 25 MG 24 hr tablet Take 1 tablet (25 mg total) by mouth daily. (Patient not taking: Reported on 08/30/2022) 30 tablet 0   pantoprazole (PROTONIX) 40 MG tablet Take 1 tablet (40 mg total) by mouth daily.     atenolol (TENORMIN) 25 MG tablet Take 1 tablet (25 mg) by mouth once daily (Patient not taking:  Reported on 08/30/2022) 30 tablet 0   bisoprolol (ZEBETA) 5 MG tablet Take 0.5 tablets (2.5 mg total) by mouth daily. (Patient not taking: Reported on 08/30/2022) 15 tablet 0   nebivolol (BYSTOLIC) 2.5 MG tablet Take 1 tablet (2.5 mg total) by mouth daily. (Patient not taking: Reported on 08/30/2022) 30 tablet 0   No facility-administered medications prior to visit.     Per HPI unless specifically indicated in ROS section below Review  of Systems  Objective:  BP 122/82   Pulse (!) 104   Ht 6' (1.829 m)   Wt 254 lb (115.2 kg)   SpO2 94%   BMI 34.45 kg/m   Wt Readings from Last 3 Encounters:  08/30/22 254 lb (115.2 kg)  08/24/22 252 lb 2 oz (114.4 kg)  07/26/22 249 lb (112.9 kg)      Physical Exam Vitals and nursing note reviewed.  Constitutional:      Appearance: Normal appearance.     Comments: Ambulates with walker  HENT:     Head: Normocephalic and atraumatic.     Mouth/Throat:     Mouth: Mucous membranes are moist.     Pharynx: Oropharynx is clear. No oropharyngeal exudate or posterior oropharyngeal erythema.  Eyes:     Extraocular Movements: Extraocular movements intact.     Conjunctiva/sclera: Conjunctivae normal.     Pupils: Pupils are equal, round, and reactive to light.  Cardiovascular:     Rate and Rhythm: Regular rhythm. Tachycardia present. Extrasystoles (few) are present.    Pulses: Normal pulses.     Heart sounds: Normal heart sounds. No murmur heard. Pulmonary:     Effort: Pulmonary effort is normal. No respiratory distress.     Breath sounds: Normal breath sounds. No wheezing, rhonchi or rales.  Musculoskeletal:     Cervical back: Normal range of motion and neck supple.     Right lower leg: No edema.     Left lower leg: No edema.  Skin:    General: Skin is warm and dry.     Findings: No rash.  Neurological:     Mental Status: He is alert.  Psychiatric:        Mood and Affect: Mood normal.        Behavior: Behavior normal.       Results for orders  placed or performed in visit on 08/14/22  H. pylori breath test  Result Value Ref Range   H pylori Breath Test Negative Negative   Lab Results  Component Value Date   CREATININE 1.03 07/26/2022   BUN 14 07/26/2022   NA 139 07/26/2022   K 4.0 07/26/2022   CL 100 07/26/2022   CO2 32 07/26/2022    Assessment & Plan:   Problem List Items Addressed This Visit     Encounter for chronic pain management (Chronic)    [place] CSRS reviewed.  Continue sparing oxycodone '10mg'$  use.       Essential hypertension    BP stable, pending change (amlodipine to Toprol XL)      Obesity, Class I, BMI 30-34.9    Weight overall stable.       Chronic lower back pain    Failed ESI, PT, lumbar nerve ablation, failed spinal cord stimulator device. Awaiting approval for electrotherapy device.  Will continue cymbalta,gabapentin, lidoderm, tylenol and oxycodone prn.       Relevant Medications   meloxicam (MOBIC) 7.5 MG tablet   CKD (chronic kidney disease) stage 3, GFR 30-59 ml/min (HCC)    Update labs.       Relevant Orders   CBC with Differential/Platelet   Comprehensive metabolic panel   Prediabetes    Update A1c.       Relevant Orders   Hemoglobin A1c   Failed spinal cord stimulator (HCC)   Generalized weakness    Significant improvement compared to last visit - previously unable to stand prolonged period, now ambulating with walker assistance.  Abscess in epidural space of lumbar spine - Primary    Continues oral keflex, has ID f/u planned next week.   Update labs today.       Vertebral osteomyelitis (HCC)   Relevant Orders   Sedimentation rate   C-reactive protein   CBC with Differential/Platelet   Comprehensive metabolic panel   Peptic ulcer disease    H/o gastric and duodenal ulcers on latest EGD, has completed 3 months of PPI, will drop to daily PPI. Will continue PPI indefinitely especially in setting of meloxicam use.  Saw GI, f/u PRN.       Relevant Orders   CBC  with Differential/Platelet   Memory deficit    Noted by pt and son - check B12, labs, RTC 3 mo for formal geriatric assessment/MMSE.       Relevant Orders   Vitamin B12     No orders of the defined types were placed in this encounter.  Orders Placed This Encounter  Procedures   Sedimentation rate   C-reactive protein   Vitamin B12   Hemoglobin A1c   CBC with Differential/Platelet   Comprehensive metabolic panel     Patient Instructions  Labs today Stop amlodipine, start toprol XL '25mg'$  daily in its place Drop pantoprazole to '40mg'$  once daily  Return in 3 months for follow up visit.  Follow up plan: Return in about 3 months (around 11/29/2022) for follow up visit.  Ria Bush, MD

## 2022-08-31 DIAGNOSIS — G3184 Mild cognitive impairment, so stated: Secondary | ICD-10-CM | POA: Insufficient documentation

## 2022-08-31 DIAGNOSIS — R413 Other amnesia: Secondary | ICD-10-CM | POA: Insufficient documentation

## 2022-08-31 DIAGNOSIS — F0150 Vascular dementia without behavioral disturbance: Secondary | ICD-10-CM | POA: Insufficient documentation

## 2022-08-31 LAB — COMPREHENSIVE METABOLIC PANEL
ALT: 8 U/L (ref 0–53)
AST: 15 U/L (ref 0–37)
Albumin: 4.1 g/dL (ref 3.5–5.2)
Alkaline Phosphatase: 81 U/L (ref 39–117)
BUN: 18 mg/dL (ref 6–23)
CO2: 33 mEq/L — ABNORMAL HIGH (ref 19–32)
Calcium: 10.7 mg/dL — ABNORMAL HIGH (ref 8.4–10.5)
Chloride: 99 mEq/L (ref 96–112)
Creatinine, Ser: 1.18 mg/dL (ref 0.40–1.50)
GFR: 56.51 mL/min — ABNORMAL LOW (ref >60.00)
Glucose, Bld: 107 mg/dL — ABNORMAL HIGH (ref 70–99)
Potassium: 4.4 mEq/L (ref 3.5–5.1)
Sodium: 139 mEq/L (ref 135–145)
Total Bilirubin: 0.3 mg/dL (ref 0.2–1.2)
Total Protein: 7.2 g/dL (ref 6.0–8.3)

## 2022-08-31 LAB — CBC WITH DIFFERENTIAL/PLATELET
Basophils Absolute: 0.1 10*3/uL (ref 0.0–0.1)
Basophils Relative: 1.6 % (ref 0.0–3.0)
Eosinophils Absolute: 0.7 10*3/uL (ref 0.0–0.7)
Eosinophils Relative: 8.5 % — ABNORMAL HIGH (ref 0.0–5.0)
HCT: 37.1 % — ABNORMAL LOW (ref 39.0–52.0)
Hemoglobin: 12.2 g/dL — ABNORMAL LOW (ref 13.0–17.0)
Lymphocytes Relative: 43.3 % (ref 12.0–46.0)
Lymphs Abs: 3.4 10*3/uL (ref 0.7–4.0)
MCHC: 32.8 g/dL (ref 30.0–36.0)
MCV: 88.4 fl (ref 78.0–100.0)
Monocytes Absolute: 1 10*3/uL (ref 0.1–1.0)
Monocytes Relative: 12 % (ref 3.0–12.0)
Neutro Abs: 2.7 10*3/uL (ref 1.4–7.7)
Neutrophils Relative %: 34.6 % — ABNORMAL LOW (ref 43.0–77.0)
Platelets: 353 10*3/uL (ref 150.0–400.0)
RBC: 4.19 Mil/uL — ABNORMAL LOW (ref 4.22–5.81)
RDW: 16.6 % — ABNORMAL HIGH (ref 11.5–15.5)
WBC: 7.9 10*3/uL (ref 4.0–10.5)

## 2022-08-31 LAB — C-REACTIVE PROTEIN: CRP: <1 mg/dL (ref 0.5–20.0)

## 2022-08-31 LAB — SEDIMENTATION RATE: Sed Rate: 30 mm/hr — ABNORMAL HIGH (ref 0–20)

## 2022-08-31 LAB — VITAMIN B12: Vitamin B-12: 274 pg/mL (ref 211–911)

## 2022-08-31 LAB — HEMOGLOBIN A1C: Hgb A1c MFr Bld: 5.8 % (ref 4.6–6.5)

## 2022-08-31 NOTE — Assessment & Plan Note (Addendum)
Continues oral keflex, has ID f/u planned next week.   Update labs today.

## 2022-08-31 NOTE — Assessment & Plan Note (Addendum)
Update labs.  

## 2022-08-31 NOTE — Assessment & Plan Note (Signed)
Weight overall stable.  

## 2022-08-31 NOTE — Assessment & Plan Note (Signed)
Noted by pt and son - check B12, labs, RTC 3 mo for formal geriatric assessment/MMSE.

## 2022-08-31 NOTE — Assessment & Plan Note (Signed)
BP stable, pending change (amlodipine to Toprol XL)

## 2022-08-31 NOTE — Assessment & Plan Note (Deleted)
Much more regular today.

## 2022-08-31 NOTE — Assessment & Plan Note (Addendum)
Failed ESI, PT, lumbar nerve ablation, failed spinal cord stimulator device. Awaiting approval for electrotherapy device.  Will continue cymbalta,gabapentin, lidoderm, tylenol and oxycodone prn.

## 2022-08-31 NOTE — Assessment & Plan Note (Signed)
Update A1c ?

## 2022-08-31 NOTE — Assessment & Plan Note (Addendum)
H/o gastric and duodenal ulcers on latest EGD, has completed 3 months of PPI, will drop to daily PPI. Will continue PPI indefinitely especially in setting of meloxicam use.  Saw GI, f/u PRN.

## 2022-08-31 NOTE — Assessment & Plan Note (Signed)
Significant improvement compared to last visit - previously unable to stand prolonged period, now ambulating with walker assistance.

## 2022-08-31 NOTE — Assessment & Plan Note (Addendum)
[place] CSRS reviewed.  Continue sparing oxycodone '10mg'$  use.

## 2022-09-01 ENCOUNTER — Other Ambulatory Visit (HOSPITAL_COMMUNITY): Payer: Self-pay

## 2022-09-02 ENCOUNTER — Other Ambulatory Visit: Payer: Self-pay | Admitting: Family Medicine

## 2022-09-02 MED ORDER — B-12 1000 MCG SL SUBL: 1.0000 | SUBLINGUAL_TABLET | Freq: Every day | SUBLINGUAL | Status: DC

## 2022-09-04 DIAGNOSIS — I129 Hypertensive chronic kidney disease with stage 1 through stage 4 chronic kidney disease, or unspecified chronic kidney disease: Secondary | ICD-10-CM | POA: Diagnosis not present

## 2022-09-04 DIAGNOSIS — M5136 Other intervertebral disc degeneration, lumbar region: Secondary | ICD-10-CM | POA: Diagnosis not present

## 2022-09-04 DIAGNOSIS — M6259 Muscle wasting and atrophy, not elsewhere classified, multiple sites: Secondary | ICD-10-CM | POA: Diagnosis not present

## 2022-09-04 DIAGNOSIS — M488X6 Other specified spondylopathies, lumbar region: Secondary | ICD-10-CM | POA: Diagnosis not present

## 2022-09-04 DIAGNOSIS — G061 Intraspinal abscess and granuloma: Secondary | ICD-10-CM | POA: Diagnosis not present

## 2022-09-04 DIAGNOSIS — M6281 Muscle weakness (generalized): Secondary | ICD-10-CM | POA: Diagnosis not present

## 2022-09-05 ENCOUNTER — Ambulatory Visit: Payer: Medicare Other | Attending: Infectious Diseases | Admitting: Infectious Diseases

## 2022-09-05 ENCOUNTER — Encounter: Payer: Self-pay | Admitting: Infectious Diseases

## 2022-09-05 ENCOUNTER — Ambulatory Visit: Payer: Medicare Other | Admitting: Infectious Diseases

## 2022-09-05 VITALS — BP 101/71 | HR 99 | Temp 97.9°F

## 2022-09-05 DIAGNOSIS — D649 Anemia, unspecified: Secondary | ICD-10-CM | POA: Diagnosis not present

## 2022-09-05 DIAGNOSIS — E039 Hypothyroidism, unspecified: Secondary | ICD-10-CM | POA: Diagnosis not present

## 2022-09-05 DIAGNOSIS — G061 Intraspinal abscess and granuloma: Secondary | ICD-10-CM | POA: Diagnosis not present

## 2022-09-05 DIAGNOSIS — M4620 Osteomyelitis of vertebra, site unspecified: Secondary | ICD-10-CM | POA: Diagnosis not present

## 2022-09-05 DIAGNOSIS — I110 Hypertensive heart disease with heart failure: Secondary | ICD-10-CM | POA: Insufficient documentation

## 2022-09-05 DIAGNOSIS — B957 Other staphylococcus as the cause of diseases classified elsewhere: Secondary | ICD-10-CM

## 2022-09-05 DIAGNOSIS — G8929 Other chronic pain: Secondary | ICD-10-CM | POA: Insufficient documentation

## 2022-09-05 DIAGNOSIS — M5136 Other intervertebral disc degeneration, lumbar region: Secondary | ICD-10-CM | POA: Insufficient documentation

## 2022-09-05 DIAGNOSIS — M549 Dorsalgia, unspecified: Secondary | ICD-10-CM | POA: Insufficient documentation

## 2022-09-05 DIAGNOSIS — R7881 Bacteremia: Secondary | ICD-10-CM | POA: Diagnosis not present

## 2022-09-05 DIAGNOSIS — M4646 Discitis, unspecified, lumbar region: Secondary | ICD-10-CM | POA: Diagnosis not present

## 2022-09-05 DIAGNOSIS — G062 Extradural and subdural abscess, unspecified: Secondary | ICD-10-CM

## 2022-09-05 NOTE — Patient Instructions (Addendum)
You are here for follow up of the lumbar vertebral infection- you have now completed 3 months of antibiotic from the surgery you had on 06/05/22. Please check ESR and CRP on 09/11/22. And fax [phone]- if normal can discontinue cephalexin

## 2022-09-05 NOTE — Progress Notes (Signed)
NAME: Anthony [name]  DOB: 1936-08-19  MRN: [id number]  Date/Time: 09/05/2022 9:52 AM   Subjective:  Pt here for follow up visit with his son ? Anthony [name] is a 87 y.o. with a history of HTN, Atrial flutter s/p ablation, Hypothyroidism,underlying degenerative spine disease and underwent lumbar epidural steroid injection on  05/17/22 and was admitted to [facility] on 05/22/22 severe back pain due to staph capitis bacteremia and epidural abscess L1-L2, b/l facet septic arthritis of L4-L5 Osteomyleitis and discitis of l5-s1. Underwent removl of spine stimulator on 06/03/22 and t laminectomy of L2-L3 and L5-S1 on 06/05/22. Took 6 weeks of Iv cefazolin following that, and now has ben on Keflex 1 gram PO q 6 since NOV-21. Plan was to give him 6-8 weeks of that depending on his response and labs He has been doing well- In [facility] Walking with walker- back pain better than before but still needs oxycodone 1 a day Recently saw his PCP who did b12 levels and is 242 at lower side of normal Pt has been tolerating keflex very well No rash, diarrhea , no fever  Past Medical History:  Diagnosis Date   Anosmia 1980s   Atrial flutter ([place]) 2018   had an ablation with dr. Erick Alley   Basal cell carcinoma 01/11/2022   L cheek - excised 04/25/22   CAD (coronary artery disease)    Cancer ([place])    skin cancer   Chronic constipation    Chronic insomnia    Chronic lower back pain    s/p spine stimulator placement   Complication of anesthesia    when under general, he woke up agitated and unable to be held down   Diastolic CHF ([place])    History of diverticulitis 2017   History of pneumonia 2013   Hyperlipidemia    Hypertension    Hypothyroidism    Irregular heart rhythm 07/29/2022   Obesity, Class II, BMI 35-39.9, with comorbidity    OSA on CPAP    PONV (postoperative nausea and vomiting)     Past Surgical History:  Procedure Laterality Date   A-FLUTTER ABLATION N/A 05/07/2017   Procedure: A-Flutter  Ablation;  Surgeon: Deboraha Sprang, MD;  Location: [place] [facility];  Service: Cardiovascular;  Laterality: N/A;   CARDIOVASCULAR STRESS TEST  11/2017   no ischemia, low risk study   COLONOSCOPY  2007   normal per prior PCP records, rpt 10 yrs (Dr Osie Cheeks)   [place] Right    ulnar nerve decompression.  PT DOES NOT RECALL THIS PROCEDURE   ENDOVASCULAR REPAIR/STENT GRAFT N/A 05/05/2020   Procedure: ENDOVASCULAR REPAIR/STENT GRAFT;  Surgeon: Algernon Huxley, MD;  Location: [place] [facility];  Service: Cardiovascular;  Laterality: N/A;   ESOPHAGOGASTRODUODENOSCOPY (EGD) WITH PROPOFOL N/A 05/30/2022   Procedure: ESOPHAGOGASTRODUODENOSCOPY (EGD) WITH PROPOFOL;  Surgeon: Jonathon Bellows, MD;  [facility];  Service: Gastroenterology;  Laterality: N/A;   LAMINECTOMY THORACIC SPINE W/ PLACEMENT SPINAL CORD STIMULATOR  08/2018   and removal of prior spine stimulator (Dr Lacinda Axon)   LUMBAR LAMINECTOMY FOR EPIDURAL ABSCESS Bilateral 06/05/2022   Procedure: LUMBAR 2- L3, L5-S1 LAMINECTOMY FOR EPIDURAL ABSCESS;  Surgeon: Meade Maw, MD;  Location: [facility];  Service: Neurosurgery;  Laterality: Bilateral;   NASAL SINUS SURGERY     nasal polyps. done a long time ago   [place] (PCI-S)  2013   EF55%, 70% mid LAD, 99% mid RCA, mild MR, elev LVEDP, DES to mid LAD. RCA  is nondominant   RADIOFREQUENCY ABLATION  06/2017   lumbar region [facility])    REPLACEMENT TOTAL KNEE BILATERAL Bilateral 2000s   SPINAL CORD STIMULATOR IMPLANT  06/2016   SPINAL CORD STIMULATOR INSERTION N/A 09/09/2018   Procedure: LUMBAR SPINAL CORD STIMULATOR LEAD AND BATTERY REMOVAL AND LAMINECTOMY FOR PLACEMENT OF PADDLE;  Surgeon: Deetta Perla, MD;  Location: [facility];  Service: Neurosurgery;  Laterality: N/A;   SPINAL CORD STIMULATOR INSERTION N/A 09/16/2018   Procedure: PLACEMENT OF SPINAL CORD STIMULATOR BATTERY;  Surgeon: Deetta Perla, MD;  Location: [facility];  Service: Neurosurgery;   Laterality: N/A;   SPINAL CORD STIMULATOR REMOVAL N/A 06/03/2022   Procedure: LUMBAR SPINAL CORD STIMULATOR REMOVAL;  Surgeon: Meade Maw, MD;  Location: [facility];  Service: Neurosurgery;  Laterality: N/A;    Social History   Socioeconomic History   Marital status: Widowed    Spouse name: Not on file   Number of children: Not on file   Years of education: Not on file   Highest education level: Not on file  Occupational History   Not on file  Tobacco Use   Smoking status: Never    Passive exposure: Never   Smokeless tobacco: Never  Vaping Use   Vaping Use: Never used  Substance and Sexual Activity   Alcohol use: No    Comment: QUIT DRINKING   Drug use: No   Sexual activity: Not Currently  Other Topics Concern   Not on file  Social History Narrative   Widower (wife passed 08/2014).    Lives alone, GF Miss [name] sometimes (she was nurse)   Son is [name] (FP at [employer]). Other son is a Psychologist, sport and exercise in [place]: retired, used to [employer] store   Edu: HS      Non-smoking; no alcohol. Lives in [place]; self.       Social Determinants of Health   Financial Resource Strain: Medium Risk (02/15/2021)   Overall Financial Resource Strain (CARDIA)    Difficulty of Paying Living Expenses: Somewhat hard  Food Insecurity: No Food Insecurity (05/23/2022)   Hunger Vital Sign    Worried About Running Out of Food in the Last Year: Never true    Ran Out of Food in the Last Year: Never true  Transportation Needs: No Transportation Needs (05/23/2022)   PRAPARE - Hydrologist (Medical): No    Lack of Transportation (Non-Medical): No  Physical Activity: Sufficiently Active (12/15/2020)   Exercise Vital Sign    Days of Exercise per Week: 7 days    Minutes of Exercise per Session: 60 min  Stress: No Stress Concern Present (12/15/2020)   [place]    Feeling of  Stress : Not at all  Social Connections: Not on file  Intimate Partner Violence: Not At Risk (05/23/2022)   Humiliation, Afraid, Rape, and Kick questionnaire    Fear of Current or Ex-Partner: No    Emotionally Abused: No    Physically Abused: No    Sexually Abused: No    Family History  Problem Relation Age of Onset   Stroke Mother    Kidney disease Father    Leukemia Brother    Stroke Sister    Diabetes Neg Hx    Allergies  Allergen Reactions   Questran [Cholestyramine]     Patient not aware of an allergy to this medicine.   Atorvastatin     Muscle  pain   Gemfibrozil Other (See Comments)    Muscle pain.   Metformin And Related     Dizziness   Rosuvastatin     Muscle pain   I? Current Outpatient Medications  Medication Sig Dispense Refill   acetaminophen (TYLENOL) 500 MG tablet Take 1 tablet (500 mg total) by mouth 2 (two) times daily as needed for moderate pain.     Alirocumab (PRALUENT) 75 MG/ML SOAJ Inject 1 pen. into the skin every 14 (fourteen) days. 2 mL 11   aspirin EC 81 MG tablet Take 1 tablet (81 mg total) by mouth daily.     cephALEXin (KEFLEX) 500 MG capsule Take 1,000 mg by mouth every 6 (six) hours.     ciclopirox (PENLAC) 8 % solution APPLY TOPICALLY AT BEDTIME APPLY OVER NAIL AND SURROUNDING SKIN, APPLY DAILY OVER PREVIOUS COAT, AFTER 7 DAYS MAY REMOVE WITH ALCOHOL AND REPEAT 6.6 mL 11   Cyanocobalamin (B-12) 1000 MCG SUBL Place 1 tablet under the tongue daily.     diphenhydramine-acetaminophen (TYLENOL PM) 25-500 MG TABS tablet Take 2 tablets by mouth at bedtime.     docusate sodium (COLACE) 100 MG capsule Take 100 mg by mouth daily.     DULoxetine (CYMBALTA) 20 MG capsule Take 1 capsule (20 mg total) by mouth daily.     Eszopiclone 3 MG TABS Take 1 tablet (3 mg total) by mouth at bedtime as needed. Use sparingly, take immediately before bedtime 30 tablet 0   ezetimibe (ZETIA) 10 MG tablet Take 1 tablet (10 mg total) by mouth daily. 90 tablet 3    finasteride (PROSCAR) 5 MG tablet TAKE 1 TABLET(5 MG) BY MOUTH DAILY 90 tablet 0   fluocinolone (SYNALAR) 0.025 % cream Apply topically 2 (two) times daily as needed.     furosemide (LASIX) 40 MG tablet TAKE 1 TABLET BY MOUTH EVERY DAY. TAKE AN EXTRA TABLET AS NEEDED FOR WEIGHT GAIN OF 3 LBS/DAY OR 5LBS/WEEK 135 tablet 3   gabapentin (NEURONTIN) 300 MG capsule TAKE 2 CAPSULE BY MOUTH AT BEDTIME WITH EXTRA CAPSULE DURING THE DAY AS NEEDED 200 capsule 1   icosapent Ethyl (VASCEPA) 1 g capsule Take 2 capsules (2 g total) by mouth 2 (two) times daily. 360 capsule 3   ketoconazole (NIZORAL) 2 % cream APPLY TO FEET AND FACE AT BEDTIME AS DIRECTED 60 g 3   ketoconazole (NIZORAL) 2 % shampoo APPLY TOPICALLY 3 TIMES WEEKLY AS DIRECTED 120 mL 2   levothyroxine (SYNTHROID) 175 MCG tablet Take 1 tablet (175 mcg total) by mouth daily before breakfast. 90 tablet 1   LINZESS 290 MCG CAPS capsule TAKE 1 CAPSULE(290 MCG) BY MOUTH DAILY BEFORE BREAKFAST 90 capsule 1   Melatonin 10 MG CAPS Take 10 mg by mouth at bedtime.     meloxicam (MOBIC) 7.5 MG tablet Take 7.5 mg by mouth daily.     metoprolol succinate (TOPROL-XL) 25 MG 24 hr tablet Take 1 tablet (25 mg total) by mouth daily. 30 tablet 0   mometasone (ELOCON) 0.1 % lotion APPLY TO SCALP AT BEDTIME ON MONDAY, WEDNESDAY, AND FRIDAY 60 mL 3   Multiple Vitamins-Minerals (CENTRUM SILVER PO) Take 1 tablet by mouth daily.     Multiple Vitamins-Minerals (PRESERVISION AREDS 2 PO) Take 1 tablet by mouth daily.     nitroGLYCERIN (NITROSTAT) 0.4 MG SL tablet Place 1 tablet (0.4 mg total) under the tongue every 5 (five) minutes as needed for chest pain. 25 tablet 0   ondansetron (ZOFRAN) 4 MG  tablet Take 4 mg by mouth every 6 (six) hours as needed for nausea.     Oxycodone HCl 10 MG TABS Take 10 mg by mouth every 4 (four) hours as needed.     pantoprazole (PROTONIX) 40 MG tablet Take 1 tablet (40 mg total) by mouth daily.     polyethylene glycol (MIRALAX / GLYCOLAX) 17 g  packet Take 17 g by mouth daily.     senna (SENOKOT) 8.6 MG tablet Take 1 tablet (8.6 mg total) by mouth daily. 30 tablet 3   sucralfate (CARAFATE) 1 g tablet Take 1 g by mouth 2 (two) times daily.     tamsulosin (FLOMAX) 0.4 MG CAPS capsule Take 1 capsule (0.4 mg total) by mouth daily. 90 capsule 3   Vitamin D, Cholecalciferol, 25 MCG (1000 UT) TABS Take 1 tablet by mouth daily.     No current facility-administered medications for this visit.     Abtx:  Anti-infectives (From admission, onward)    None       REVIEW OF SYSTEMS:  Const: negative fever, negative chills, some weight loss Eyes: negative diplopia or visual changes, negative eye pain ENT: negative coryza, negative sore throat Resp: negative cough, hemoptysis, dyspnea Cards: negative for chest pain, palpitations, lower extremity edema GU: negative for frequency, dysuria and hematuria GI: Negative for abdominal pain, diarrhea, bleeding, constipation Skin: negative for rash and pruritus Heme: negative for easy bruising and gum/nose bleeding MS: back pain and muscle weakness Neurolo:negative for headaches, dizziness, vertigo, memory problems  Psych: negative for feelings of anxiety, depression  Endocrine: hypothyroidism  Allergy/Immunology- as above ?  Objective:  VITALS:  BP 101/71   Pulse 99   Temp 97.9 F (36.6 C) (Temporal)   SpO2 92%   PHYSICAL EXAM:  General: Alert, cooperative, no distress, appears stated age.  Head: Normocephalic, without obvious abnormality, atraumatic. Eyes: Conjunctivae clear, anicteric sclerae. Pupils are equal ENT Nares normal. No drainage or sinus tenderness. Lips, mucosa, and tongue normal. No Thrush Neck: Supple, symmetrical, no adenopathy, thyroid: non tender no carotid bruit and no JVD. Back: surgical scar healed well Lungs: Clear to auscultation bilaterally. No Wheezing or Rhonchi. No rales. Heart: irregular- well controlled Abdomen: Soft, non-tender,not distended. Bowel  sounds normal. No masses Extremities: atraumatic, no cyanosis. No edema. No clubbing Skin: No rashes or lesions. Or bruising Lymph: Cervical, supraclavicular normal. Neurologic: Grossly non-focal Ambulated with walker Pertinent Labs ESR 30  on 08/30/22 ( was 127 on 06/01/22) CRP< 1 ( 42.2 on 05/22/22) HB 12.2 ( was 8.2 on 06/08/22) ? Impression/Recommendation Epidural abscess L1-L2, b/l facet septic arthritis of L4-L5 Osteomyleitis and discitis of L5-s1. S/p laminectomy L2/L3 and L4-L5 on 06/05/22  Staph capitis bacteremia After completing 6 weeks of IV cefazolin has bene on cephalexin since 11/21- ESR and CRP normalized from 08/30/22- Will repeat another set of labs net week and if still normal will DC antibiotics as infection has reoslved   Degenerative disc disease with chronic back pain  Anemia has much improved  Low normal B12- his PCP wanted to start B12 tablets- gave orders to NH to start meds  ? ? ___________________________________________________ Discussed with patient, and his son in detail  Follow PRN Note:  This document was prepared using Dragon voice recognition software and may include unintentional dictation errors.

## 2022-09-06 ENCOUNTER — Ambulatory Visit: Payer: Medicare Other | Admitting: Family Medicine

## 2022-09-11 ENCOUNTER — Other Ambulatory Visit
Admission: RE | Admit: 2022-09-11 | Discharge: 2022-09-11 | Disposition: A | Discharge status: Home / Self Care | Payer: Medicare Other | Attending: Infectious Diseases | Admitting: Infectious Diseases

## 2022-09-11 DIAGNOSIS — M6259 Muscle wasting and atrophy, not elsewhere classified, multiple sites: Secondary | ICD-10-CM | POA: Diagnosis not present

## 2022-09-11 DIAGNOSIS — G061 Intraspinal abscess and granuloma: Secondary | ICD-10-CM | POA: Diagnosis not present

## 2022-09-11 DIAGNOSIS — M488X6 Other specified spondylopathies, lumbar region: Secondary | ICD-10-CM | POA: Diagnosis not present

## 2022-09-11 DIAGNOSIS — M4640 Discitis, unspecified, site unspecified: Secondary | ICD-10-CM | POA: Diagnosis not present

## 2022-09-11 DIAGNOSIS — M5136 Other intervertebral disc degeneration, lumbar region: Secondary | ICD-10-CM | POA: Diagnosis not present

## 2022-09-11 DIAGNOSIS — M869 Osteomyelitis, unspecified: Secondary | ICD-10-CM | POA: Insufficient documentation

## 2022-09-11 DIAGNOSIS — M6281 Muscle weakness (generalized): Secondary | ICD-10-CM | POA: Diagnosis not present

## 2022-09-11 DIAGNOSIS — I129 Hypertensive chronic kidney disease with stage 1 through stage 4 chronic kidney disease, or unspecified chronic kidney disease: Secondary | ICD-10-CM | POA: Diagnosis not present

## 2022-09-11 LAB — SEDIMENTATION RATE: Sed Rate: 19 mm/hr (ref 0–20)

## 2022-09-11 LAB — C-REACTIVE PROTEIN: CRP: 1.2 mg/dL — ABNORMAL HIGH (ref <1.0)

## 2022-09-12 ENCOUNTER — Telehealth: Payer: Self-pay

## 2022-09-12 NOTE — Telephone Encounter (Signed)
I spoke with Tiffany with [facility] and gave verbal orders for patient to stop Keflex and his labs look good. Tiffany also advised no need for more antibiotics. Tiffany verbalized understanding.  Jerman Tinnon T Brooks Sailors

## 2022-09-12 NOTE — Telephone Encounter (Signed)
-----  Message from Tsosie Billing, MD sent at 09/12/2022  2:19 PM EST ----- Can you fax orders [facility] facility where he is currently residing  to stop Keflex as his ESR/CRPgood  ( yesterday)and he has completed 14 weeks of antibiotic and stating week 16. He does not need any more antibiitcs- thx

## 2022-09-13 ENCOUNTER — Other Ambulatory Visit: Payer: Self-pay | Admitting: Family Medicine

## 2022-09-13 NOTE — Progress Notes (Signed)
POA requests to d/c daily weight and PRN lasix order. Will continue lasix '40mg'$  daily

## 2022-09-18 DIAGNOSIS — M6281 Muscle weakness (generalized): Secondary | ICD-10-CM | POA: Diagnosis not present

## 2022-09-18 DIAGNOSIS — M488X6 Other specified spondylopathies, lumbar region: Secondary | ICD-10-CM | POA: Diagnosis not present

## 2022-09-18 DIAGNOSIS — G061 Intraspinal abscess and granuloma: Secondary | ICD-10-CM | POA: Diagnosis not present

## 2022-09-18 DIAGNOSIS — M5136 Other intervertebral disc degeneration, lumbar region: Secondary | ICD-10-CM | POA: Diagnosis not present

## 2022-09-18 DIAGNOSIS — I129 Hypertensive chronic kidney disease with stage 1 through stage 4 chronic kidney disease, or unspecified chronic kidney disease: Secondary | ICD-10-CM | POA: Diagnosis not present

## 2022-09-18 DIAGNOSIS — M6259 Muscle wasting and atrophy, not elsewhere classified, multiple sites: Secondary | ICD-10-CM | POA: Diagnosis not present

## 2022-09-20 ENCOUNTER — Telehealth: Payer: Self-pay

## 2022-09-20 NOTE — Progress Notes (Signed)
Care Management & Coordination Services Pharmacy Team  Reason for Encounter: Appointment Reminder  Contacted patient to confirm telephone appointment with Charlene Brooke,  PharmD, on 09/25/2022 at 3:00.  Unsuccessful outreach. Left voicemail for patient to return call.  Medications: Outpatient Encounter Medications as of 09/20/2022  Medication Sig Note   acetaminophen (TYLENOL) 500 MG tablet Take 1 tablet (500 mg total) by mouth 2 (two) times daily as needed for moderate pain.    Alirocumab (PRALUENT) 75 MG/ML SOAJ Inject 1 pen. into the skin every 14 (fourteen) days.    aspirin EC 81 MG tablet Take 1 tablet (81 mg total) by mouth daily.    cephALEXin (KEFLEX) 500 MG capsule Take 1,000 mg by mouth every 6 (six) hours.    ciclopirox (PENLAC) 8 % solution APPLY TOPICALLY AT BEDTIME APPLY OVER NAIL AND SURROUNDING SKIN, APPLY DAILY OVER PREVIOUS COAT, AFTER 7 DAYS MAY REMOVE WITH ALCOHOL AND REPEAT    Cyanocobalamin (B-12) 1000 MCG SUBL Place 1 tablet under the tongue daily.    diphenhydramine-acetaminophen (TYLENOL PM) 25-500 MG TABS tablet Take 2 tablets by mouth at bedtime.    docusate sodium (COLACE) 100 MG capsule Take 100 mg by mouth daily.    DULoxetine (CYMBALTA) 20 MG capsule Take 1 capsule (20 mg total) by mouth daily.    Eszopiclone 3 MG TABS Take 1 tablet (3 mg total) by mouth at bedtime as needed. Use sparingly, take immediately before bedtime    ezetimibe (ZETIA) 10 MG tablet Take 1 tablet (10 mg total) by mouth daily.    finasteride (PROSCAR) 5 MG tablet TAKE 1 TABLET(5 MG) BY MOUTH DAILY    fluocinolone (SYNALAR) 0.025 % cream Apply topically 2 (two) times daily as needed.    furosemide (LASIX) 40 MG tablet Take 1 tablet (40 mg total) by mouth daily.    gabapentin (NEURONTIN) 300 MG capsule TAKE 2 CAPSULE BY MOUTH AT BEDTIME WITH EXTRA CAPSULE DURING THE DAY AS NEEDED    icosapent Ethyl (VASCEPA) 1 g capsule Take 2 capsules (2 g total) by mouth 2 (two) times daily.     ketoconazole (NIZORAL) 2 % cream APPLY TO FEET AND FACE AT BEDTIME AS DIRECTED    ketoconazole (NIZORAL) 2 % shampoo APPLY TOPICALLY 3 TIMES WEEKLY AS DIRECTED    levothyroxine (SYNTHROID) 175 MCG tablet Take 1 tablet (175 mcg total) by mouth daily before breakfast.    LINZESS 290 MCG CAPS capsule TAKE 1 CAPSULE(290 MCG) BY MOUTH DAILY BEFORE BREAKFAST    Melatonin 10 MG CAPS Take 10 mg by mouth at bedtime.    meloxicam (MOBIC) 7.5 MG tablet Take 7.5 mg by mouth daily.    metoprolol succinate (TOPROL-XL) 25 MG 24 hr tablet Take 1 tablet (25 mg total) by mouth daily.    mometasone (ELOCON) 0.1 % lotion APPLY TO SCALP AT BEDTIME ON MONDAY, WEDNESDAY, AND FRIDAY    Multiple Vitamins-Minerals (CENTRUM SILVER PO) Take 1 tablet by mouth daily.    Multiple Vitamins-Minerals (PRESERVISION AREDS 2 PO) Take 1 tablet by mouth daily.    nitroGLYCERIN (NITROSTAT) 0.4 MG SL tablet Place 1 tablet (0.4 mg total) under the tongue every 5 (five) minutes as needed for chest pain.    ondansetron (ZOFRAN) 4 MG tablet Take 4 mg by mouth every 6 (six) hours as needed for nausea.    Oxycodone HCl 10 MG TABS Take 10 mg by mouth every 4 (four) hours as needed.    pantoprazole (PROTONIX) 40 MG tablet Take 1 tablet (40  mg total) by mouth daily.    polyethylene glycol (MIRALAX / GLYCOLAX) 17 g packet Take 17 g by mouth daily.    senna (SENOKOT) 8.6 MG tablet Take 1 tablet (8.6 mg total) by mouth daily.    sucralfate (CARAFATE) 1 g tablet Take 1 g by mouth 2 (two) times daily.    tamsulosin (FLOMAX) 0.4 MG CAPS capsule Take 1 capsule (0.4 mg total) by mouth daily. 05/22/2022: Pt dad states he takes PRN   Vitamin D, Cholecalciferol, 25 MCG (1000 UT) TABS Take 1 tablet by mouth daily.    No facility-administered encounter medications on file as of 09/20/2022.   Lab Results  Component Value Date/Time   HGBA1C 5.8 08/30/2022 03:35 PM   HGBA1C 6.3 04/25/2022 08:40 AM    BP Readings from Last 3 Encounters:  09/05/22 101/71   08/30/22 122/82  08/24/22 130/72    Star Rating Drugs:  Medication:  Last Fill: Day Supply None noted  Care Gaps: Annual wellness visit in last year? Yes 12/28/2021  Charlene Brooke, PharmD notified  Marijean Niemann, [place] [facility] Assistant [phone]

## 2022-09-21 DIAGNOSIS — K2100 Gastro-esophageal reflux disease with esophagitis, without bleeding: Secondary | ICD-10-CM | POA: Diagnosis not present

## 2022-09-21 DIAGNOSIS — M6281 Muscle weakness (generalized): Secondary | ICD-10-CM | POA: Diagnosis not present

## 2022-09-21 DIAGNOSIS — E785 Hyperlipidemia, unspecified: Secondary | ICD-10-CM | POA: Diagnosis not present

## 2022-09-21 DIAGNOSIS — M5136 Other intervertebral disc degeneration, lumbar region: Secondary | ICD-10-CM | POA: Diagnosis not present

## 2022-09-21 DIAGNOSIS — N183 Chronic kidney disease, stage 3 unspecified: Secondary | ICD-10-CM | POA: Diagnosis not present

## 2022-09-21 DIAGNOSIS — M6259 Muscle wasting and atrophy, not elsewhere classified, multiple sites: Secondary | ICD-10-CM | POA: Diagnosis not present

## 2022-09-21 DIAGNOSIS — G061 Intraspinal abscess and granuloma: Secondary | ICD-10-CM | POA: Diagnosis not present

## 2022-09-21 DIAGNOSIS — I2510 Atherosclerotic heart disease of native coronary artery without angina pectoris: Secondary | ICD-10-CM | POA: Diagnosis not present

## 2022-09-21 DIAGNOSIS — M488X6 Other specified spondylopathies, lumbar region: Secondary | ICD-10-CM | POA: Diagnosis not present

## 2022-09-21 DIAGNOSIS — G4733 Obstructive sleep apnea (adult) (pediatric): Secondary | ICD-10-CM | POA: Diagnosis not present

## 2022-09-21 DIAGNOSIS — Z6833 Body mass index (BMI) 33.0-33.9, adult: Secondary | ICD-10-CM | POA: Diagnosis not present

## 2022-09-21 DIAGNOSIS — G4700 Insomnia, unspecified: Secondary | ICD-10-CM | POA: Diagnosis not present

## 2022-09-21 DIAGNOSIS — E039 Hypothyroidism, unspecified: Secondary | ICD-10-CM | POA: Diagnosis not present

## 2022-09-21 DIAGNOSIS — Z87440 Personal history of urinary (tract) infections: Secondary | ICD-10-CM | POA: Diagnosis not present

## 2022-09-21 DIAGNOSIS — K5900 Constipation, unspecified: Secondary | ICD-10-CM | POA: Diagnosis not present

## 2022-09-21 DIAGNOSIS — Z9181 History of falling: Secondary | ICD-10-CM | POA: Diagnosis not present

## 2022-09-21 DIAGNOSIS — I129 Hypertensive chronic kidney disease with stage 1 through stage 4 chronic kidney disease, or unspecified chronic kidney disease: Secondary | ICD-10-CM | POA: Diagnosis not present

## 2022-09-21 DIAGNOSIS — N419 Inflammatory disease of prostate, unspecified: Secondary | ICD-10-CM | POA: Diagnosis not present

## 2022-09-25 ENCOUNTER — Encounter: Payer: Medicare Other | Admitting: Pharmacist

## 2022-09-25 DIAGNOSIS — G061 Intraspinal abscess and granuloma: Secondary | ICD-10-CM | POA: Diagnosis not present

## 2022-09-25 DIAGNOSIS — M488X6 Other specified spondylopathies, lumbar region: Secondary | ICD-10-CM | POA: Diagnosis not present

## 2022-09-25 DIAGNOSIS — M6259 Muscle wasting and atrophy, not elsewhere classified, multiple sites: Secondary | ICD-10-CM | POA: Diagnosis not present

## 2022-09-25 DIAGNOSIS — I129 Hypertensive chronic kidney disease with stage 1 through stage 4 chronic kidney disease, or unspecified chronic kidney disease: Secondary | ICD-10-CM | POA: Diagnosis not present

## 2022-09-25 DIAGNOSIS — M5136 Other intervertebral disc degeneration, lumbar region: Secondary | ICD-10-CM | POA: Diagnosis not present

## 2022-09-25 DIAGNOSIS — M6281 Muscle weakness (generalized): Secondary | ICD-10-CM | POA: Diagnosis not present

## 2022-09-25 NOTE — Progress Notes (Unsigned)
Care Management & Coordination Services Pharmacy Note  09/25/2022 Name:  Anthony [name] MRN:  [id number] DOB:  02-03-1936  Summary: -Reviewed medications; pt affirms compliance -Pt reports occasional use of Aleve for pain; he is taking meloxicam daily, discussed interaction/duplicate therapy risks including GI bleeding and kidney injury -TRIG were 516 in May, pt was probably not taking ezetimib at that time. He is now compliant with Praulent, ezetimibe and Vascepa   Recommendations/Changes made from today's visit: -Advised to limit use of OTC NSAIDs (Aleve, Advil, etc) -Repeat lipid panel at upcoming CPE  Follow up plan: -Health Concierge will call patient *** -Pharmacist follow up televisit scheduled for ***     Subjective: Anthony [name] is an 87 y.o. year old male who is a primary patient of Ria Bush, MD.  The care coordination team was consulted for assistance with disease management and care coordination needs.    Engaged with patient by telephone for follow up visit.  Recent office visits: 08/30/22 Dr Danise Mina OV: f/u - pt opted to try metoprolol from cardiology.  07/26/22 Dr Danise Mina OV: hospital f/u - restart meloxicam 7.5 mg, duloxetine 20 mg.  05/02/22 Dr Danise Mina OV: f/u - reduce meloxicam dose to 7.5 mg (CKD). TRIG improved to 332, reviewed diet. TSH high, increase levothyroxine to 175 mcg. Return 4-6 wks for TFT.  Recent consult visits: 09/05/22 Dr Delaine Lame (ID): f/u - infection resolved.  08/24/22 Dr Caryl Comes (Cardiology): f/u - stop amlodipine. given 4 different beta blockers to try, trial at least 2 weeks on each. (Atenolol, bisoprolol, metoprolol, nebivolol)  08/14/22 Dr Vicente Males (GI): new pt - hx duodenal ulcer. Avoid NSAIDs as much as possible. Continue PPI.  07/25/22 Dr Delaine Lame (ID): f/u - completed IV abx, now on keflex x 6 weeks.  06/29/22 Dr Delaine Lame (ID): f/u abscess, vertebral osteomyelitis. IV cefazolin through 07/16/22.   06/29/22 Dr  Izora Ribas (Gen Surg): f/u abscess -   06/27/22 Dr Erlene Quan (Urology): new pt - urinary retention, BPH. Updated med list - d/c vitD, zofran, tizanidine, trazodone. Removed foley.  Hospital visits: 05/22/22 - 06/08/22 Admission [facility]): Sepsis, vertebral osteomyelitis. Spinal cord stimulator removed. Complicated by urinary retention, AKI and upper GI bleed. Restarted aspirin, hold Plavix. Rx Pantoprazole BID x 3 months. Hold lisinopril for AKI.   Objective:  Lab Results  Component Value Date   CREATININE 1.18 08/30/2022   BUN 18 08/30/2022   GFR 56.51 (L) 08/30/2022   GFRNONAA >60 06/08/2022   GFRAA 45 (L) 05/06/2020   NA 139 08/30/2022   K 4.4 08/30/2022   CALCIUM 10.7 (H) 08/30/2022   CO2 33 (H) 08/30/2022   GLUCOSE 107 (H) 08/30/2022    Lab Results  Component Value Date/Time   HGBA1C 5.8 08/30/2022 03:35 PM   HGBA1C 6.3 04/25/2022 08:40 AM   GFR 56.51 (L) 08/30/2022 03:35 PM   GFR 66.57 07/26/2022 04:15 PM    Last diabetic Eye exam: No results found for: "HMDIABEYEEXA"  Last diabetic Foot exam: No results found for: "HMDIABFOOTEX"   Lab Results  Component Value Date   CHOL 161 04/25/2022   HDL 45.30 04/25/2022   LDLCALC 24 10/06/2019   LDLDIRECT 47.0 04/25/2022   TRIG 332.0 (H) 04/25/2022   CHOLHDL 4 04/25/2022       Latest Ref Rng & Units 08/30/2022    3:35 PM 05/30/2022    8:30 AM 05/26/2022    7:13 AM  Hepatic Function  Total Protein 6.0 - 8.3 g/dL 7.2  5.4  6.7   Albumin 3.5 -  5.2 g/dL 4.1  2.0  2.7   AST 0 - 37 U/L 15  22  36   ALT 0 - 53 U/L '8  19  29   '$ Alk Phosphatase 39 - 117 U/L 81  50  88   Total Bilirubin 0.2 - 1.2 mg/dL 0.3  0.6  1.3   Bilirubin, Direct 0.0 - 0.2 mg/dL   0.3     Lab Results  Component Value Date/Time   TSH 2.73 07/26/2022 04:15 PM   TSH 7.36 (H) 04/25/2022 08:40 AM   FREET4 0.82 07/26/2022 04:15 PM   FREET4 0.74 12/21/2021 07:58 AM       Latest Ref Rng & Units 08/30/2022    3:35 PM 07/26/2022    4:15 PM 06/08/2022    5:22 AM   CBC  WBC 4.0 - 10.5 K/uL 7.9  9.0  14.2   Hemoglobin 13.0 - 17.0 g/dL 12.2  11.4  8.2   Hematocrit 39.0 - 52.0 % 37.1  34.5  25.6   Platelets 150.0 - 400.0 K/uL 353.0  396.0  536     Lab Results  Component Value Date/Time   VD25OH 33.91 04/25/2022 08:40 AM   VD25OH 30.62 12/21/2021 07:58 AM   VITAMINB12 274 08/30/2022 03:35 PM    Clinical ASCVD: Yes  The ASCVD Risk score (Arnett DK, et al., 2019) failed to calculate for the following reasons:   The 2019 ASCVD risk score is only valid for ages 43 to 22        04/10/2022   11:16 AM 12/28/2021   11:19 AM 12/15/2020    1:58 PM  Depression screen PHQ 2/9  Decreased Interest 0 3 0  Down, Depressed, Hopeless 0 0 0  PHQ - 2 Score 0 3 0  Altered sleeping   0  Tired, decreased energy  3 0  Change in appetite  0 0  Feeling bad or failure about yourself   0 0  Trouble concentrating  0 0  Moving slowly or fidgety/restless  0 0  Suicidal thoughts  0 0  PHQ-9 Score   0  Difficult doing work/chores  Not difficult at all Not difficult at all     Social History   Tobacco Use  Smoking Status Never   Passive exposure: Never  Smokeless Tobacco Never   BP Readings from Last 3 Encounters:  09/05/22 101/71  08/30/22 122/82  08/24/22 130/72   Pulse Readings from Last 3 Encounters:  09/05/22 99  08/30/22 (!) 104  08/24/22 (!) 101   Wt Readings from Last 3 Encounters:  08/30/22 254 lb (115.2 kg)  08/24/22 252 lb 2 oz (114.4 kg)  07/26/22 249 lb (112.9 kg)   BMI Readings from Last 3 Encounters:  08/30/22 34.45 kg/m  08/24/22 34.19 kg/m  07/26/22 33.77 kg/m    Allergies  Allergen Reactions   Questran [Cholestyramine]     Patient not aware of an allergy to this medicine.   Atorvastatin     Muscle pain   Gemfibrozil Other (See Comments)    Muscle pain.   Metformin And Related     Dizziness   Rosuvastatin     Muscle pain    Medications Reviewed Today     Reviewed by Allegra Grana, CMA (Certified Medical  Assistant) on 09/05/22 at [phone]  Med List Status: <None>   Medication Order Taking? Sig Documenting Provider Last Dose Status Informant  acetaminophen (TYLENOL) 500 MG tablet [id number] Yes Take 1 tablet (500 mg total) by  mouth 2 (two) times daily as needed for moderate pain. Ria Bush, MD Taking Active Self  Alirocumab (PRALUENT) 75 MG/ML Darden Palmer [id number] Yes Inject 1 pen. into the skin every 14 (fourteen) days. Deboraha Sprang, MD Taking Active   aspirin EC 81 MG tablet [id number] Yes Take 1 tablet (81 mg total) by mouth daily. Ria Bush, MD Taking Active Self  cephALEXin [facility]) 500 MG capsule [id number] Yes Take 1,000 mg by mouth every 6 (six) hours. [provider] Taking Active   ciclopirox (PENLAC) 8 % solution [id number] Yes APPLY TOPICALLY AT BEDTIME APPLY OVER NAIL AND SURROUNDING SKIN, APPLY DAILY OVER PREVIOUS COAT, AFTER 7 DAYS MAY REMOVE WITH ALCOHOL AND REPEAT Ralene Bathe, MD Taking Active   Cyanocobalamin (B-12) 1000 MCG SUBL [id number] Yes Place 1 tablet under the tongue daily. Ria Bush, MD Taking Active   diphenhydramine-acetaminophen (TYLENOL PM) 25-500 MG TABS tablet [id number] Yes Take 2 tablets by mouth at bedtime. [provider] Taking Active Self  docusate sodium (COLACE) 100 MG capsule [id number] Yes Take 100 mg by mouth daily. [provider] Taking Active Self  DULoxetine (CYMBALTA) 20 MG capsule [id number] Yes Take 1 capsule (20 mg total) by mouth daily. Ria Bush, MD Taking Active   Eszopiclone 3 MG TABS [id number] Yes Take 1 tablet (3 mg total) by mouth at bedtime as needed. Use sparingly, take immediately before bedtime Ria Bush, MD Taking Active   ezetimibe (ZETIA) 10 MG tablet [id number] Yes Take 1 tablet (10 mg total) by mouth daily. Ria Bush, MD Taking Active   finasteride (PROSCAR) 5 MG tablet [id number] Yes TAKE 1 TABLET(5 MG) BY MOUTH DAILY Ria Bush, MD Taking Active   fluocinolone  (SYNALAR) 0.025 % cream [id number] Yes Apply topically 2 (two) times daily as needed. [provider] Taking Active   furosemide (LASIX) 40 MG tablet [id number] Yes TAKE 1 TABLET BY MOUTH EVERY DAY. TAKE AN EXTRA TABLET AS NEEDED FOR WEIGHT GAIN OF 3 LBS/DAY OR 5LBS/WEEK Ria Bush, MD Taking Active   gabapentin (NEURONTIN) 300 MG capsule [id number] Yes TAKE 2 CAPSULE BY MOUTH AT BEDTIME WITH EXTRA CAPSULE DURING THE DAY AS NEEDED Ria Bush, MD Taking Active   icosapent Ethyl (VASCEPA) 1 g capsule [id number] Yes Take 2 capsules (2 g total) by mouth 2 (two) times daily. Deboraha Sprang, MD Taking Active   ketoconazole (NIZORAL) 2 % cream [id number] Yes APPLY TO FEET AND FACE AT BEDTIME AS DIRECTED Ralene Bathe, MD Taking Active   ketoconazole (NIZORAL) 2 % shampoo [id number] Yes APPLY TOPICALLY 3 TIMES WEEKLY AS DIRECTED Ralene Bathe, MD Taking Active   levothyroxine (SYNTHROID) 175 MCG tablet [id number] Yes Take 1 tablet (175 mcg total) by mouth daily before breakfast. Ria Bush, MD Taking Active   LINZESS 290 MCG CAPS capsule [id number] Yes TAKE 1 CAPSULE(290 MCG) McCracken Gutierrez, Javier, MD Taking Active   Melatonin 10 MG CAPS [id number] Yes Take 10 mg by mouth at bedtime. [provider] Taking Active Self  meloxicam (MOBIC) 7.5 MG tablet [id number] Yes Take 7.5 mg by mouth daily. [provider] Taking Active   metoprolol succinate (TOPROL-XL) 25 MG 24 hr tablet [id number] Yes Take 1 tablet (25 mg total) by mouth daily. Deboraha Sprang, MD Taking Active   mometasone (ELOCON) 0.1 % lotion [id number] Yes APPLY TO SCALP AT BEDTIME ON MONDAY, [facility], AND FRIDAY Ralene Bathe, MD Taking Active   Multiple Vitamins-Minerals (CENTRUM SILVER PO) [id number] Yes Take 1  tablet by mouth daily. [provider] Taking Active Self  Multiple Vitamins-Minerals (PRESERVISION AREDS 2 PO) [id number] Yes Take 1 tablet by mouth daily.  [provider] Taking Active Self  nitroGLYCERIN (NITROSTAT) 0.4 MG SL tablet [id number] Yes Place 1 tablet (0.4 mg total) under the tongue every 5 (five) minutes as needed for chest pain. Ria Bush, MD Taking Active            Med Note Vonna Drafts, [facility] M   Fri Feb 06, 2020  2:06 PM)    ondansetron (ZOFRAN) 4 MG tablet [id number] Yes Take 4 mg by mouth every 6 (six) hours as needed for nausea. [provider] Taking Active   Oxycodone HCl 10 MG TABS [id number] Yes Take 10 mg by mouth every 4 (four) hours as needed. [provider] Taking Active   pantoprazole (PROTONIX) 40 MG tablet [id number] Yes Take 1 tablet (40 mg total) by mouth daily. Ria Bush, MD Taking Active   polyethylene glycol [facility] / GLYCOLAX) 17 g packet [id number] Yes Take 17 g by mouth daily. [provider] Taking Active   senna (SENOKOT) 8.6 MG tablet [id number] Yes Take 1 tablet (8.6 mg total) by mouth daily. Ria Bush, MD Taking Active   sucralfate (CARAFATE) 1 g tablet [id number] Yes Take 1 g by mouth 2 (two) times daily. [provider] Taking Active   tamsulosin (FLOMAX) 0.4 MG CAPS capsule [id number] Yes Take 1 capsule (0.4 mg total) by mouth daily. Ria Bush, MD Taking Active            Med Note Oswaldo Milian, TRE'SEAN J   Mon May 22, 2022  6:04 PM) Pt dad states he takes PRN  Vitamin D, Cholecalciferol, 25 MCG (1000 UT) TABS [id number] Yes Take 1 tablet by mouth daily. [provider] Taking Active   Med List Note Dewayne Shorter, RN 02/06/22 1057): UDS 03/2017 Medication agreement signed 08-07-2017 MR 03-09-18 01-31-2022 Inbox message to Dr Gregor Hams to stop Plavix for 7 days. 02-06-2022             SDOH:  (Social Determinants of Health) assessments and interventions performed: {yes/no:20286} SDOH Interventions    Flowsheet Row Chronic Care Management from 02/15/2021 in [place] at [place]  from 12/15/2020 in [place] at [place] from 10/30/2019 in [place] at [place] from 10/24/2018 in [place] at [place]  SDOH Interventions      Depression Interventions/Treatment  -- CWU8-8 Score <4 Follow-up Not Indicated PHQ2-9 Score <4 Follow-up Not Indicated PHQ2-9 Score <4 Follow-up Not Indicated  Financial Strain Interventions Other (Comment)  [Coverage gap - aaply for Linzess assistance] -- -- --       Medication Assistance: {MEDASSISTANCEINFO:25044}  Medication Access: Within the past 30 days, how often has patient missed a dose of medication? *** Is a pillbox or other method used to improve adherence? {YES/NO:21197} Factors that may affect medication adherence? {CHL DESC; BARRIERS:21522} Are meds synced by current pharmacy? {YES/NO:21197} Are meds delivered by current pharmacy? {YES/NO:21197} Does patient experience delays in picking up medications due to transportation concerns? {YES/NO:21197}  [facility] Reviewed: Is patient disadvantaged to use [facility]?: {YES/NO:21197} Current Rx insurance plan: *** Name and location of Current pharmacy:  [place], [place] - [address] [place] [address] Phone: [phone] Fax: [phone]  [facility] services reviewed with patient  today?: {YES/NO:21197} Patient requests to transfer care to [facility]?: {YES/NO:21197} Reason patient declined to change pharmacies: {US patient preference:27474}  Compliance/Adherence/Medication fill history: Care Gaps: ***  Star-Rating Drugs: ***   Assessment/Plan   Hypertension / Heart Failure (BP goal <140/90) -Controlled - per clinic readings  -Last ejection fraction: 60% (Date: 01/2017) -HF type: Diastolic -Current treatment: Amlodipine 5 mg daily - Appropriate,  Effective, Safe, Accessible Furosemide 40 mg daily + extra PRN wt gain -Appropriate, Effective, Safe, Accessible Metoprolol succinate 25 mg daily -Medications previously tried: lisinopril (AKI) -Educated on BP goals and benefits of medications for prevention of heart attack, stroke and kidney damage; -Counseled to monitor BP at home periodically -Recommended to continue current medication  Hyperlipidemia / CAD (LDL goal < 70) -Controlled - LDL 55 (11/2021) at goal; TRIG 516 above goal - per fill history he was likely not taking ezetimibe at time of these labs, he has since restarted -Hx CAD, PCI 2013 -Current treatment: Praluent 75 mg/mL inject q2 weeks - Appropriate, Effective, Safe, Accessible Ezetimibe 10 mg daily - Appropriate, Effective, Safe, Accessible Vascepa 1 gm - 2 cap BID -Appropriate, Query Effective Nitroglycerin 0.4 mg SL PRN -Appropriate, Effective, Safe, Accessible Aspirin 81 mg daily - Appropriate, Effective, Safe, Accessible -Medications previously tried: clopidogrel (GI bleed)   -Educated on Cholesterol goals;  -Recommended to continue current medication  Hx GI Bleed (Goal: ***) -{US controlled/uncontrolled:25276} -GI Bleed during hospitalization 05/2022 for sepsis, while on DAPT. Completed 3 months of BID PPI -Current treatment  Pantoprazole 40 mg daily -Medications previously tried: ***  -{CCMPHARMDINTERVENTION:25122}  Chronic constipation (Goal: manage symptoms) -Controlled -Current treatment  Linzess 290 mcg daily - Appropriate, Effective, Safe, Accessible Docusate 100 mg daily-Appropriate, Effective, Safe, Accessible Senna 8.6 mg daily -Appropriate, Effective, Safe, Accessible Miralax PRN -Medications previously tried: n/a  -Recommended to continue current medication  Chronic Pain -Controlled -Hx OA, spondyloisthesis, lumbar DDD; follows with pain mgmt -Hx spinal cord stimulator, spinal injections - complicated by abscess and vertebral osteomyelitis  05/2022; completed > 14 wks of abx -Current treatment: Tylenol 500 mg - Appropriate, Effective, Safe, Accessible Meloxicam 7.5 mg daily - Appropriate, Effective, Safe, Accessible Oxycodone 10 mg PRN - *** Gabapentin 300 mg - 2 tab HS -Appropriate, Effective, Safe, Accessible Duloxetine 20 mg daily - *** Icy Hot Aleve PRN - Appropriate, Effective, Query Safe -Medications previously tried: n/a  -Reviewed Aleve + meloxicam interaction/duplicate therapy; discussed risk for kidney injury, GI bleeding with concurrent therapy; advised to limit Aleve use as much as possible, ideally stop using altogether  Topicals -Controlled - pt reports he rotates through several topicals -Current treatment  Ciclopirox 8%  soln Fluocinonide 0.05% soln Ketoconazole 2% cream Ketoconazole 2% shampoo Mometasone 0.1% cream Mometasone 0.1% lotion -Medications previously tried: n/a  -Recommended to continue current medication   ***

## 2022-10-02 DIAGNOSIS — M488X6 Other specified spondylopathies, lumbar region: Secondary | ICD-10-CM | POA: Diagnosis not present

## 2022-10-02 DIAGNOSIS — M5136 Other intervertebral disc degeneration, lumbar region: Secondary | ICD-10-CM | POA: Diagnosis not present

## 2022-10-02 DIAGNOSIS — G061 Intraspinal abscess and granuloma: Secondary | ICD-10-CM | POA: Diagnosis not present

## 2022-10-02 DIAGNOSIS — M6259 Muscle wasting and atrophy, not elsewhere classified, multiple sites: Secondary | ICD-10-CM | POA: Diagnosis not present

## 2022-10-02 DIAGNOSIS — M6281 Muscle weakness (generalized): Secondary | ICD-10-CM | POA: Diagnosis not present

## 2022-10-02 DIAGNOSIS — I129 Hypertensive chronic kidney disease with stage 1 through stage 4 chronic kidney disease, or unspecified chronic kidney disease: Secondary | ICD-10-CM | POA: Diagnosis not present

## 2022-10-04 ENCOUNTER — Ambulatory Visit (INDEPENDENT_AMBULATORY_CARE_PROVIDER_SITE_OTHER): Payer: Medicare Other | Admitting: Dermatology

## 2022-10-04 DIAGNOSIS — Z79899 Other long term (current) drug therapy: Secondary | ICD-10-CM | POA: Diagnosis not present

## 2022-10-04 DIAGNOSIS — Z1283 Encounter for screening for malignant neoplasm of skin: Secondary | ICD-10-CM

## 2022-10-04 DIAGNOSIS — L814 Other melanin hyperpigmentation: Secondary | ICD-10-CM

## 2022-10-04 DIAGNOSIS — B353 Tinea pedis: Secondary | ICD-10-CM

## 2022-10-04 DIAGNOSIS — L578 Other skin changes due to chronic exposure to nonionizing radiation: Secondary | ICD-10-CM

## 2022-10-04 DIAGNOSIS — L821 Other seborrheic keratosis: Secondary | ICD-10-CM | POA: Diagnosis not present

## 2022-10-04 DIAGNOSIS — L570 Actinic keratosis: Secondary | ICD-10-CM | POA: Diagnosis not present

## 2022-10-04 DIAGNOSIS — D229 Melanocytic nevi, unspecified: Secondary | ICD-10-CM | POA: Diagnosis not present

## 2022-10-04 DIAGNOSIS — L219 Seborrheic dermatitis, unspecified: Secondary | ICD-10-CM

## 2022-10-04 DIAGNOSIS — Z85828 Personal history of other malignant neoplasm of skin: Secondary | ICD-10-CM | POA: Diagnosis not present

## 2022-10-04 MED ORDER — KETOCONAZOLE 2 % EX CREA: TOPICAL_CREAM | CUTANEOUS | 3 refills | Status: DC

## 2022-10-04 MED ORDER — HYDROCORTISONE 2.5 % EX CREA: TOPICAL_CREAM | CUTANEOUS | 6 refills | Status: DC

## 2022-10-04 NOTE — Progress Notes (Unsigned)
Follow-Up Visit   Subjective  Anthony [name] is a 87 y.o. male who presents for the following: Annual Exam. Hx of BCC.  The patient presents for Total-Body Skin Exam (TBSE) for skin cancer screening and mole check.  The patient has spots, moles and lesions to be evaluated, some may be new or changing and the patient has concerns that these could be cancer.   Son with patient   The following portions of the chart were reviewed this encounter and updated as appropriate:   Tobacco  Allergies  Meds  Problems  Med Hx  Surg Hx  Fam Hx     Review of Systems:  No other skin or systemic complaints except as noted in HPI or Assessment and Plan.  Objective  Well appearing patient in no apparent distress; mood and affect are within normal limits.  A full examination was performed including scalp, head, eyes, ears, nose, lips, neck, chest, axillae, abdomen, back, buttocks, bilateral upper extremities, bilateral lower extremities, hands, feet, fingers, toes, fingernails, and toenails. All findings within normal limits unless otherwise noted below.  scalp x 3, face x 5  (8) (8) Erythematous thin papules/macules with gritty scale.   face Pink patches with greasy scale.    Assessment & Plan  AK (actinic keratosis) (8) scalp x 3, face x 5  (8) Actinic keratoses are precancerous spots that appear secondary to cumulative UV radiation exposure/sun exposure over time. They are chronic with expected duration over 1 year. A portion of actinic keratoses will progress to squamous cell carcinoma of the skin. It is not possible to reliably predict which spots will progress to skin cancer and so treatment is recommended to prevent development of skin cancer.  Recommend daily broad spectrum sunscreen SPF 30+ to sun-exposed areas, reapply every 2 hours as needed.  Recommend staying in the shade or wearing long sleeves, sun glasses (UVA+UVB protection) and wide brim hats (4-inch brim around the entire  circumference of the hat). Call for new or changing lesions.   Destruction of lesion - scalp x 3, face x 5  (8) Complexity: simple   Destruction method: cryotherapy   Informed consent: discussed and consent obtained   Timeout:  patient name, date of birth, surgical site, and procedure verified Lesion destroyed using liquid nitrogen: Yes   Region frozen until ice ball extended beyond lesion: Yes   Outcome: patient tolerated procedure well with no complications   Post-procedure details: wound care instructions given    Seborrheic dermatitis face Seborrheic Dermatitis  -  is a chronic persistent rash characterized by pinkness and scaling most commonly of the mid face but also can occur on the scalp (dandruff), ears; mid chest, mid back and groin.  It tends to be exacerbated by stress and cooler weather.  People who have neurologic disease may experience new onset or exacerbation of existing seborrheic dermatitis.  The condition is not curable but treatable and can be controlled.   Start Ketoconazole cream apply to face at bedtime Mon, Wed, Fri  Start Hydrocortisone cream apply to face at bedtime Tues, Thurs, Sat   Related Medications ketoconazole (NIZORAL) 2 % cream APPLY TO FACE AT BEDTIME MON, WED, FRI hydrocortisone 2.5 % cream APPLY TO FACE AT BEDTIME TUES, THURS, SAT  Tinea pedis and unguium of both feet Chronic and persistent condition with duration or expected duration over one year. Condition is symptomatic / bothersome to patient. Not to goal. Penlac nail lacquer qd to nails And Ketoconazole cream to fee  qpm  Related Medications ciclopirox (PENLAC) 8 % solution APPLY TOPICALLY AT BEDTIME APPLY OVER NAIL AND SURROUNDING SKIN, APPLY DAILY OVER PREVIOUS COAT, AFTER 7 DAYS MAY REMOVE WITH ALCOHOL AND REPEAT  Lentigines - Scattered tan macules - Due to sun exposure - Benign-appearing, observe - Recommend daily broad spectrum sunscreen SPF 30+ to sun-exposed areas, reapply  every 2 hours as needed. - Call for any changes  Seborrheic Keratoses - Stuck-on, waxy, tan-brown papules and/or plaques  - Benign-appearing - Discussed benign etiology and prognosis. - Observe - Call for any changes  Melanocytic Nevi - Tan-brown and/or pink-flesh-colored symmetric macules and papules - Benign appearing on exam today - Observation - Call clinic for new or changing moles - Recommend daily use of broad spectrum spf 30+ sunscreen to sun-exposed areas.   Hemangiomas - Red papules - Discussed benign nature - Observe - Call for any changes  Actinic Damage - Chronic condition, secondary to cumulative UV/sun exposure - diffuse scaly erythematous macules with underlying dyspigmentation - Recommend daily broad spectrum sunscreen SPF 30+ to sun-exposed areas, reapply every 2 hours as needed.  - Staying in the shade or wearing long sleeves, sun glasses (UVA+UVB protection) and wide brim hats (4-inch brim around the entire circumference of the hat) are also recommended for sun protection.  - Call for new or changing lesions.  History of Basal Cell Carcinoma of the Skin Left cheek 01/11/2022 - No evidence of recurrence today - Recommend regular full body skin exams - Recommend daily broad spectrum sunscreen SPF 30+ to sun-exposed areas, reapply every 2 hours as needed.  - Call if any new or changing lesions are noted between office visits   Skin cancer screening performed today.   Return in about 6 months (around 04/04/2023) for sun exposed areas AKs .  IMarye Round, CMA, am acting as scribe for Sarina Ser, MD .  Documentation: I have reviewed the above documentation for accuracy and completeness, and I agree with the above.  Sarina Ser, MD

## 2022-10-04 NOTE — Patient Instructions (Addendum)
Cryotherapy Aftercare  Wash gently with soap and water everyday.   Apply Vaseline and Band-Aid daily until healed.     Due to recent changes in healthcare laws, you may see results of your pathology and/or laboratory studies on MyChart before the doctors have had a chance to review them. We understand that in some cases there may be results that are confusing or concerning to you. Please understand that not all results are received at the same time and often the doctors may need to interpret multiple results in order to provide you with the best plan of care or course of treatment. Therefore, we ask that you please give us 2 business days to thoroughly review all your results before contacting the office for clarification. Should we see a critical lab result, you will be contacted sooner.   If You Need Anything After Your Visit  If you have any questions or concerns for your doctor, please call our main line at [phone] and press option 4 to reach your doctor's medical assistant. If no one answers, please leave a voicemail as directed and we will return your call as soon as possible. Messages left after 4 pm will be answered the following business day.   You may also send us a message via MyChart. We typically respond to MyChart messages within 1-2 business days.  For prescription refills, please ask your pharmacy to contact our office. Our fax number is [phone].  If you have an urgent issue when the clinic is closed that cannot wait until the next business day, you can page your doctor at the number below.    Please note that while we do our best to be available for urgent issues outside of office hours, we are not available 24/7.   If you have an urgent issue and are unable to reach us, you may choose to seek medical care at your doctor's office, retail clinic, urgent care center, or emergency room.  If you have a medical emergency, please immediately call 911 or go to the  emergency department.  Pager Numbers  - Dr. Kowalski: [phone]  - Dr. Moye: [phone]  - Dr. Stewart: [phone]  In the event of inclement weather, please call our main line at [phone] for an update on the status of any delays or closures.  Dermatology Medication Tips: Please keep the boxes that topical medications come in in order to help keep track of the instructions about where and how to use these. Pharmacies typically print the medication instructions only on the boxes and not directly on the medication tubes.   If your medication is too expensive, please contact our office at [phone] option 4 or send us a message through MyChart.   We are unable to tell what your co-pay for medications will be in advance as this is different depending on your insurance coverage. However, we may be able to find a substitute medication at lower cost or fill out paperwork to get insurance to cover a needed medication.   If a prior authorization is required to get your medication covered by your insurance company, please allow us 1-2 business days to complete this process.  Drug prices often vary depending on where the prescription is filled and some pharmacies may offer cheaper prices.  The website www.goodrx.com contains coupons for medications through different pharmacies. The prices here do not account for what the cost may be with help from insurance (it may be cheaper with your insurance), but the website can   give you the price if you did not use any insurance.  - You can print the associated coupon and take it with your prescription to the pharmacy.  - You may also stop by our office during regular business hours and pick up a GoodRx coupon card.  - If you need your prescription sent electronically to a different pharmacy, notify our office through [place] MyChart or by phone at [phone] option 4.     Si Usted Necesita Algo Despus de Su Visita  Tambin puede  enviarnos un mensaje a travs de MyChart. Por lo general respondemos a los mensajes de MyChart en el transcurso de 1 a 2 das hbiles.  Para renovar recetas, por favor pida a su farmacia que se ponga en contacto con nuestra oficina. Nuestro nmero de fax es el [phone].  Si tiene un asunto urgente cuando la clnica est cerrada y que no puede esperar hasta el siguiente da hbil, puede llamar/localizar a su doctor(a) al nmero que aparece a continuacin.   Por favor, tenga en cuenta que aunque hacemos todo lo posible para estar disponibles para asuntos urgentes fuera del horario de oficina, no estamos disponibles las 24 horas del da, los 7 das de la semana.   Si tiene un problema urgente y no puede comunicarse con nosotros, puede optar por buscar atencin mdica  en el consultorio de su doctor(a), en una clnica privada, en un centro de atencin urgente o en una sala de emergencias.  Si tiene una emergencia mdica, por favor llame inmediatamente al 911 o vaya a la sala de emergencias.  Nmeros de bper  - Dr. Kowalski: [phone]  - Dra. Moye: [phone]  - Dra. Stewart: [phone]  En caso de inclemencias del tiempo, por favor llame a nuestra lnea principal al [phone] para una actualizacin sobre el estado de cualquier retraso o cierre.  Consejos para la medicacin en dermatologa: Por favor, guarde las cajas en las que vienen los medicamentos de uso tpico para ayudarle a seguir las instrucciones sobre dnde y cmo usarlos. Las farmacias generalmente imprimen las instrucciones del medicamento slo en las cajas y no directamente en los tubos del medicamento.   Si su medicamento es muy caro, por favor, pngase en contacto con nuestra oficina llamando al [phone] y presione la opcin 4 o envenos un mensaje a travs de MyChart.   No podemos decirle cul ser su copago por los medicamentos por adelantado ya que esto es diferente dependiendo de la cobertura de su seguro.  Sin embargo, es posible que podamos encontrar un medicamento sustituto a menor costo o llenar un formulario para que el seguro cubra el medicamento que se considera necesario.   Si se requiere una autorizacin previa para que su compaa de seguros cubra su medicamento, por favor permtanos de 1 a 2 das hbiles para completar este proceso.  Los precios de los medicamentos varan con frecuencia dependiendo del lugar de dnde se surte la receta y alguna farmacias pueden ofrecer precios ms baratos.  El sitio web www.goodrx.com tiene cupones para medicamentos de diferentes farmacias. Los precios aqu no tienen en cuenta lo que podra costar con la ayuda del seguro (puede ser ms barato con su seguro), pero el sitio web puede darle el precio si no utiliz ningn seguro.  - Puede imprimir el cupn correspondiente y llevarlo con su receta a la farmacia.  - Tambin puede pasar por nuestra oficina durante el horario de atencin regular y recoger una tarjeta de cupones de GoodRx.  -   Si necesita que su receta se enve electrnicamente a una farmacia diferente, informe a nuestra oficina a travs de MyChart de [place] o por telfono llamando al [phone] y presione la opcin 4.  

## 2022-10-05 ENCOUNTER — Encounter: Payer: Self-pay | Admitting: Dermatology

## 2022-10-09 DIAGNOSIS — M488X6 Other specified spondylopathies, lumbar region: Secondary | ICD-10-CM | POA: Diagnosis not present

## 2022-10-09 DIAGNOSIS — M6281 Muscle weakness (generalized): Secondary | ICD-10-CM | POA: Diagnosis not present

## 2022-10-09 DIAGNOSIS — G061 Intraspinal abscess and granuloma: Secondary | ICD-10-CM | POA: Diagnosis not present

## 2022-10-09 DIAGNOSIS — I129 Hypertensive chronic kidney disease with stage 1 through stage 4 chronic kidney disease, or unspecified chronic kidney disease: Secondary | ICD-10-CM | POA: Diagnosis not present

## 2022-10-09 DIAGNOSIS — M6259 Muscle wasting and atrophy, not elsewhere classified, multiple sites: Secondary | ICD-10-CM | POA: Diagnosis not present

## 2022-10-09 DIAGNOSIS — M5136 Other intervertebral disc degeneration, lumbar region: Secondary | ICD-10-CM | POA: Diagnosis not present

## 2022-10-14 ENCOUNTER — Other Ambulatory Visit: Payer: Self-pay | Admitting: Internal Medicine

## 2022-10-16 ENCOUNTER — Encounter: Payer: Self-pay | Admitting: Internal Medicine

## 2022-10-16 DIAGNOSIS — M6281 Muscle weakness (generalized): Secondary | ICD-10-CM | POA: Diagnosis not present

## 2022-10-16 DIAGNOSIS — G061 Intraspinal abscess and granuloma: Secondary | ICD-10-CM | POA: Diagnosis not present

## 2022-10-16 DIAGNOSIS — M488X6 Other specified spondylopathies, lumbar region: Secondary | ICD-10-CM | POA: Diagnosis not present

## 2022-10-16 DIAGNOSIS — I129 Hypertensive chronic kidney disease with stage 1 through stage 4 chronic kidney disease, or unspecified chronic kidney disease: Secondary | ICD-10-CM | POA: Diagnosis not present

## 2022-10-16 DIAGNOSIS — M5136 Other intervertebral disc degeneration, lumbar region: Secondary | ICD-10-CM | POA: Diagnosis not present

## 2022-10-16 DIAGNOSIS — M6259 Muscle wasting and atrophy, not elsewhere classified, multiple sites: Secondary | ICD-10-CM | POA: Diagnosis not present

## 2022-10-17 ENCOUNTER — Other Ambulatory Visit: Payer: Self-pay | Admitting: *Deleted

## 2022-10-17 MED ORDER — VASCEPA 1 G PO CAPS: 2.0000 g | ORAL_CAPSULE | Freq: Two times a day (BID) | ORAL | 2 refills | Status: DC

## 2022-10-21 DIAGNOSIS — N183 Chronic kidney disease, stage 3 unspecified: Secondary | ICD-10-CM | POA: Diagnosis not present

## 2022-10-21 DIAGNOSIS — M6259 Muscle wasting and atrophy, not elsewhere classified, multiple sites: Secondary | ICD-10-CM | POA: Diagnosis not present

## 2022-10-21 DIAGNOSIS — Z9181 History of falling: Secondary | ICD-10-CM | POA: Diagnosis not present

## 2022-10-21 DIAGNOSIS — M5136 Other intervertebral disc degeneration, lumbar region: Secondary | ICD-10-CM | POA: Diagnosis not present

## 2022-10-21 DIAGNOSIS — K2100 Gastro-esophageal reflux disease with esophagitis, without bleeding: Secondary | ICD-10-CM | POA: Diagnosis not present

## 2022-10-21 DIAGNOSIS — M6281 Muscle weakness (generalized): Secondary | ICD-10-CM | POA: Diagnosis not present

## 2022-10-21 DIAGNOSIS — I129 Hypertensive chronic kidney disease with stage 1 through stage 4 chronic kidney disease, or unspecified chronic kidney disease: Secondary | ICD-10-CM | POA: Diagnosis not present

## 2022-10-21 DIAGNOSIS — G061 Intraspinal abscess and granuloma: Secondary | ICD-10-CM | POA: Diagnosis not present

## 2022-10-21 DIAGNOSIS — N419 Inflammatory disease of prostate, unspecified: Secondary | ICD-10-CM | POA: Diagnosis not present

## 2022-10-21 DIAGNOSIS — Z87440 Personal history of urinary (tract) infections: Secondary | ICD-10-CM | POA: Diagnosis not present

## 2022-10-21 DIAGNOSIS — Z6833 Body mass index (BMI) 33.0-33.9, adult: Secondary | ICD-10-CM | POA: Diagnosis not present

## 2022-10-21 DIAGNOSIS — G4733 Obstructive sleep apnea (adult) (pediatric): Secondary | ICD-10-CM | POA: Diagnosis not present

## 2022-10-21 DIAGNOSIS — E785 Hyperlipidemia, unspecified: Secondary | ICD-10-CM | POA: Diagnosis not present

## 2022-10-21 DIAGNOSIS — K5900 Constipation, unspecified: Secondary | ICD-10-CM | POA: Diagnosis not present

## 2022-10-21 DIAGNOSIS — I2510 Atherosclerotic heart disease of native coronary artery without angina pectoris: Secondary | ICD-10-CM | POA: Diagnosis not present

## 2022-10-21 DIAGNOSIS — G4700 Insomnia, unspecified: Secondary | ICD-10-CM | POA: Diagnosis not present

## 2022-10-21 DIAGNOSIS — M488X6 Other specified spondylopathies, lumbar region: Secondary | ICD-10-CM | POA: Diagnosis not present

## 2022-10-21 DIAGNOSIS — E039 Hypothyroidism, unspecified: Secondary | ICD-10-CM | POA: Diagnosis not present

## 2022-10-23 DIAGNOSIS — M5136 Other intervertebral disc degeneration, lumbar region: Secondary | ICD-10-CM | POA: Diagnosis not present

## 2022-10-23 DIAGNOSIS — M6259 Muscle wasting and atrophy, not elsewhere classified, multiple sites: Secondary | ICD-10-CM | POA: Diagnosis not present

## 2022-10-23 DIAGNOSIS — I129 Hypertensive chronic kidney disease with stage 1 through stage 4 chronic kidney disease, or unspecified chronic kidney disease: Secondary | ICD-10-CM | POA: Diagnosis not present

## 2022-10-23 DIAGNOSIS — G061 Intraspinal abscess and granuloma: Secondary | ICD-10-CM | POA: Diagnosis not present

## 2022-10-23 DIAGNOSIS — M6281 Muscle weakness (generalized): Secondary | ICD-10-CM | POA: Diagnosis not present

## 2022-10-23 DIAGNOSIS — M488X6 Other specified spondylopathies, lumbar region: Secondary | ICD-10-CM | POA: Diagnosis not present

## 2022-10-30 DIAGNOSIS — G061 Intraspinal abscess and granuloma: Secondary | ICD-10-CM | POA: Diagnosis not present

## 2022-10-30 DIAGNOSIS — I129 Hypertensive chronic kidney disease with stage 1 through stage 4 chronic kidney disease, or unspecified chronic kidney disease: Secondary | ICD-10-CM | POA: Diagnosis not present

## 2022-10-30 DIAGNOSIS — M5136 Other intervertebral disc degeneration, lumbar region: Secondary | ICD-10-CM | POA: Diagnosis not present

## 2022-10-30 DIAGNOSIS — M6259 Muscle wasting and atrophy, not elsewhere classified, multiple sites: Secondary | ICD-10-CM | POA: Diagnosis not present

## 2022-10-30 DIAGNOSIS — M6281 Muscle weakness (generalized): Secondary | ICD-10-CM | POA: Diagnosis not present

## 2022-10-30 DIAGNOSIS — M488X6 Other specified spondylopathies, lumbar region: Secondary | ICD-10-CM | POA: Diagnosis not present

## 2022-11-03 ENCOUNTER — Other Ambulatory Visit: Payer: Self-pay | Admitting: Family Medicine

## 2022-11-03 MED ORDER — OXYCODONE HCL 10 MG PO TABS: 10.0000 mg | ORAL_TABLET | Freq: Three times a day (TID) | ORAL | 0 refills | Status: DC | PRN

## 2022-11-03 NOTE — Telephone Encounter (Signed)
Name of Medication: Oxycodone Name of Pharmacy: [facility] or Written Date and Quantity:  Last Office Visit and Type: 08/30/22, f/u Next Office Visit and Type: none Last Controlled Substance Agreement Date: 07/04/19 Last UDS:  07/04/19

## 2022-11-03 NOTE — Telephone Encounter (Signed)
Prescription Request  11/03/2022  LOV: 08/30/2022  What is the name of the medication or equipment? Oxycodone HCl 10 MG TABS   Have you contacted your pharmacy to request a refill? No   Which pharmacy would you like this sent to?  [facility], [address] [place] [address] Phone: [phone] Fax: [phone]   Patient notified that their request is being sent to the clinical staff for review and that they should receive a response within 2 business days.   Please advise at [place]

## 2022-11-03 NOTE — Telephone Encounter (Signed)
Last seen 08/30/22 no follow up on file

## 2022-11-03 NOTE — Telephone Encounter (Signed)
Lvm asking pt to call back.  Need to relay Dr. G's message.  

## 2022-11-03 NOTE — Telephone Encounter (Signed)
ERx Plz notify pt #30 has been refilled to [facility]. Let us know if notes increased need/use.

## 2022-11-06 DIAGNOSIS — M6281 Muscle weakness (generalized): Secondary | ICD-10-CM | POA: Diagnosis not present

## 2022-11-06 DIAGNOSIS — G061 Intraspinal abscess and granuloma: Secondary | ICD-10-CM | POA: Diagnosis not present

## 2022-11-06 DIAGNOSIS — M6259 Muscle wasting and atrophy, not elsewhere classified, multiple sites: Secondary | ICD-10-CM | POA: Diagnosis not present

## 2022-11-06 DIAGNOSIS — M488X6 Other specified spondylopathies, lumbar region: Secondary | ICD-10-CM | POA: Diagnosis not present

## 2022-11-06 DIAGNOSIS — M5136 Other intervertebral disc degeneration, lumbar region: Secondary | ICD-10-CM | POA: Diagnosis not present

## 2022-11-06 DIAGNOSIS — I129 Hypertensive chronic kidney disease with stage 1 through stage 4 chronic kidney disease, or unspecified chronic kidney disease: Secondary | ICD-10-CM | POA: Diagnosis not present

## 2022-11-06 NOTE — Telephone Encounter (Signed)
Spoke with pt relaying Dr. Synthia Innocent message. Pt expresses his thanks and states there was not increase in need/use for the oxycodone.

## 2022-11-15 DIAGNOSIS — G061 Intraspinal abscess and granuloma: Secondary | ICD-10-CM | POA: Diagnosis not present

## 2022-11-15 DIAGNOSIS — M5136 Other intervertebral disc degeneration, lumbar region: Secondary | ICD-10-CM | POA: Diagnosis not present

## 2022-11-15 DIAGNOSIS — M6281 Muscle weakness (generalized): Secondary | ICD-10-CM | POA: Diagnosis not present

## 2022-11-15 DIAGNOSIS — M488X6 Other specified spondylopathies, lumbar region: Secondary | ICD-10-CM | POA: Diagnosis not present

## 2022-11-15 DIAGNOSIS — M6259 Muscle wasting and atrophy, not elsewhere classified, multiple sites: Secondary | ICD-10-CM | POA: Diagnosis not present

## 2022-11-15 DIAGNOSIS — I129 Hypertensive chronic kidney disease with stage 1 through stage 4 chronic kidney disease, or unspecified chronic kidney disease: Secondary | ICD-10-CM | POA: Diagnosis not present

## 2022-12-08 ENCOUNTER — Telehealth: Payer: Self-pay

## 2022-12-08 DIAGNOSIS — G8929 Other chronic pain: Secondary | ICD-10-CM

## 2022-12-08 MED ORDER — OXYCODONE HCL 10 MG PO TABS: 10.0000 mg | ORAL_TABLET | Freq: Two times a day (BID) | ORAL | 0 refills | Status: DC | PRN

## 2022-12-08 NOTE — Telephone Encounter (Addendum)
ERx to [employer] [place] CSRS reviewed.

## 2022-12-08 NOTE — Addendum Note (Signed)
Addended by: Eustaquio Boyden on: 12/08/2022 01:20 PM   Modules accepted: Orders

## 2022-12-08 NOTE — Telephone Encounter (Signed)
Name of Medication: Oxycodone Name of Pharmacy: [facility] Last Fill or Written Date and Quantity: 11/03/22, #30 Last Office Visit and Type: 08/30/22, hosp f/u? Next Office Visit and Type: none Last Controlled Substance Agreement Date: 07/04/19 Last UDS:  07/04/19

## 2022-12-15 ENCOUNTER — Telehealth: Payer: Self-pay | Admitting: Family Medicine

## 2022-12-15 NOTE — Telephone Encounter (Signed)
Misty Stanley from [facility] called to let Dr. Reece Agar know that pt had a fall today, 4/19, around 2:44pm. Misty Stanley stated the fall was bedside. Misty Stanley stated there is a small bruise on the pt's back & there was a small bleed from the pt's toe. Misty Stanley stated the blood seem to come from underneath the pt's toe nail. Misty Stanley stated the blood was cleaned up, pt didn't need a band aid. Misty Stanley stated the pt isn't complaining of any pain. Call back # [phone]

## 2022-12-15 NOTE — Telephone Encounter (Addendum)
Spoke with Vikki Ports of [facility] relaying Dr. Timoteo Expose message. She documented for Misty Stanley and requests message be faxed to [phone], attn:  Vikki Ports.   Phn note faxed by Joellen.

## 2022-12-15 NOTE — Telephone Encounter (Signed)
Noted.  Monitor toenail for developing blood collection under the nail (hematoma) and if that happens he may need to have it drained.  Let us know if worsening back pain or new bowel/bladder incontinence, leg weakness, or other red flag symptom.

## 2022-12-18 ENCOUNTER — Other Ambulatory Visit (INDEPENDENT_AMBULATORY_CARE_PROVIDER_SITE_OTHER): Payer: Medicare Other

## 2022-12-18 ENCOUNTER — Telehealth: Payer: Self-pay | Admitting: Family Medicine

## 2022-12-18 ENCOUNTER — Ambulatory Visit (INDEPENDENT_AMBULATORY_CARE_PROVIDER_SITE_OTHER): Payer: Medicare Other | Admitting: Nurse Practitioner
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
## 2022-12-19 NOTE — Telephone Encounter (Addendum)
Noted.  Agree with HH orders.  Ensure no injury with these falls. If not, we can discuss at upcoming appt 5/10.
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
## 2022-12-21 DIAGNOSIS — M4626 Osteomyelitis of vertebra, lumbar region: Secondary | ICD-10-CM | POA: Diagnosis not present

## 2022-12-21 DIAGNOSIS — R7303 Prediabetes: Secondary | ICD-10-CM | POA: Diagnosis not present

## 2022-12-21 DIAGNOSIS — Z9181 History of falling: Secondary | ICD-10-CM | POA: Diagnosis not present

## 2022-12-21 DIAGNOSIS — K279 Peptic ulcer, site unspecified, unspecified as acute or chronic, without hemorrhage or perforation: Secondary | ICD-10-CM | POA: Diagnosis not present

## 2022-12-21 DIAGNOSIS — Z6830 Body mass index (BMI) 30.0-30.9, adult: Secondary | ICD-10-CM | POA: Diagnosis not present

## 2022-12-21 DIAGNOSIS — G8929 Other chronic pain: Secondary | ICD-10-CM | POA: Diagnosis not present

## 2022-12-21 DIAGNOSIS — Z7982 Long term (current) use of aspirin: Secondary | ICD-10-CM | POA: Diagnosis not present

## 2022-12-21 DIAGNOSIS — E669 Obesity, unspecified: Secondary | ICD-10-CM | POA: Diagnosis not present

## 2022-12-21 DIAGNOSIS — R2689 Other abnormalities of gait and mobility: Secondary | ICD-10-CM | POA: Diagnosis not present

## 2022-12-21 DIAGNOSIS — G061 Intraspinal abscess and granuloma: Secondary | ICD-10-CM | POA: Diagnosis not present

## 2022-12-21 DIAGNOSIS — N183 Chronic kidney disease, stage 3 unspecified: Secondary | ICD-10-CM | POA: Diagnosis not present

## 2022-12-21 DIAGNOSIS — I129 Hypertensive chronic kidney disease with stage 1 through stage 4 chronic kidney disease, or unspecified chronic kidney disease: Secondary | ICD-10-CM | POA: Diagnosis not present

## 2022-12-21 DIAGNOSIS — R339 Retention of urine, unspecified: Secondary | ICD-10-CM | POA: Diagnosis not present

## 2022-12-21 DIAGNOSIS — M6281 Muscle weakness (generalized): Secondary | ICD-10-CM | POA: Diagnosis not present

## 2022-12-21 DIAGNOSIS — M545 Low back pain, unspecified: Secondary | ICD-10-CM | POA: Diagnosis not present

## 2022-12-25 ENCOUNTER — Telehealth: Payer: Self-pay | Admitting: Family Medicine

## 2022-12-25 DIAGNOSIS — N183 Chronic kidney disease, stage 3 unspecified: Secondary | ICD-10-CM | POA: Diagnosis not present

## 2022-12-25 DIAGNOSIS — G8929 Other chronic pain: Secondary | ICD-10-CM | POA: Diagnosis not present

## 2022-12-25 DIAGNOSIS — R2689 Other abnormalities of gait and mobility: Secondary | ICD-10-CM | POA: Diagnosis not present

## 2022-12-25 DIAGNOSIS — M4626 Osteomyelitis of vertebra, lumbar region: Secondary | ICD-10-CM | POA: Diagnosis not present

## 2022-12-25 DIAGNOSIS — M545 Low back pain, unspecified: Secondary | ICD-10-CM | POA: Diagnosis not present

## 2022-12-25 DIAGNOSIS — I129 Hypertensive chronic kidney disease with stage 1 through stage 4 chronic kidney disease, or unspecified chronic kidney disease: Secondary | ICD-10-CM | POA: Diagnosis not present

## 2022-12-25 NOTE — Telephone Encounter (Signed)
Home Health verbal orders Caller Name: Modesto Charon Agency Name: Aldine Contes [facility]  Callback number: [phone]  Requesting OT POC Approval to include ADL, therapeutic exercises and activities, and functional transfer to shower and tiolet.   Frequency: 1x a week for 6 weeks  Please forward to [facility] pool or providers CMA

## 2022-12-26 NOTE — Telephone Encounter (Addendum)
Dachara notified as instructed by telephone and verbalized understanding. Modesto Charon stated that she will let her office know the last time that Dr. Sharen Hones saw the patient was 08/2022.Modesto Charon stated that she will call back if anything further is needed from our office.

## 2022-12-26 NOTE — Telephone Encounter (Addendum)
Agree with HH orders.  I haven't seen him since early 08/2022

## 2022-12-29 DIAGNOSIS — M4626 Osteomyelitis of vertebra, lumbar region: Secondary | ICD-10-CM | POA: Diagnosis not present

## 2022-12-29 DIAGNOSIS — G8929 Other chronic pain: Secondary | ICD-10-CM | POA: Diagnosis not present

## 2022-12-29 DIAGNOSIS — R2689 Other abnormalities of gait and mobility: Secondary | ICD-10-CM | POA: Diagnosis not present

## 2022-12-29 DIAGNOSIS — I129 Hypertensive chronic kidney disease with stage 1 through stage 4 chronic kidney disease, or unspecified chronic kidney disease: Secondary | ICD-10-CM | POA: Diagnosis not present

## 2022-12-29 DIAGNOSIS — M545 Low back pain, unspecified: Secondary | ICD-10-CM | POA: Diagnosis not present

## 2022-12-29 DIAGNOSIS — N183 Chronic kidney disease, stage 3 unspecified: Secondary | ICD-10-CM | POA: Diagnosis not present

## 2023-01-01 DIAGNOSIS — R2689 Other abnormalities of gait and mobility: Secondary | ICD-10-CM | POA: Diagnosis not present

## 2023-01-01 DIAGNOSIS — I129 Hypertensive chronic kidney disease with stage 1 through stage 4 chronic kidney disease, or unspecified chronic kidney disease: Secondary | ICD-10-CM | POA: Diagnosis not present

## 2023-01-01 DIAGNOSIS — M545 Low back pain, unspecified: Secondary | ICD-10-CM | POA: Diagnosis not present

## 2023-01-01 DIAGNOSIS — M4626 Osteomyelitis of vertebra, lumbar region: Secondary | ICD-10-CM | POA: Diagnosis not present

## 2023-01-01 DIAGNOSIS — G8929 Other chronic pain: Secondary | ICD-10-CM | POA: Diagnosis not present

## 2023-01-01 DIAGNOSIS — N183 Chronic kidney disease, stage 3 unspecified: Secondary | ICD-10-CM | POA: Diagnosis not present

## 2023-01-03 ENCOUNTER — Other Ambulatory Visit: Payer: Self-pay | Admitting: Internal Medicine

## 2023-01-03 DIAGNOSIS — M545 Low back pain, unspecified: Secondary | ICD-10-CM | POA: Diagnosis not present

## 2023-01-03 DIAGNOSIS — E782 Mixed hyperlipidemia: Secondary | ICD-10-CM

## 2023-01-03 DIAGNOSIS — M4626 Osteomyelitis of vertebra, lumbar region: Secondary | ICD-10-CM | POA: Diagnosis not present

## 2023-01-03 DIAGNOSIS — G8929 Other chronic pain: Secondary | ICD-10-CM | POA: Diagnosis not present

## 2023-01-03 DIAGNOSIS — N183 Chronic kidney disease, stage 3 unspecified: Secondary | ICD-10-CM | POA: Diagnosis not present

## 2023-01-03 DIAGNOSIS — I129 Hypertensive chronic kidney disease with stage 1 through stage 4 chronic kidney disease, or unspecified chronic kidney disease: Secondary | ICD-10-CM | POA: Diagnosis not present

## 2023-01-03 DIAGNOSIS — R2689 Other abnormalities of gait and mobility: Secondary | ICD-10-CM | POA: Diagnosis not present

## 2023-01-05 ENCOUNTER — Ambulatory Visit (INDEPENDENT_AMBULATORY_CARE_PROVIDER_SITE_OTHER): Payer: Medicare Other | Admitting: Family Medicine

## 2023-01-05 ENCOUNTER — Encounter: Payer: Self-pay | Admitting: Family Medicine

## 2023-01-05 ENCOUNTER — Other Ambulatory Visit (INDEPENDENT_AMBULATORY_CARE_PROVIDER_SITE_OTHER): Payer: Self-pay | Admitting: Vascular Surgery

## 2023-01-05 VITALS — BP 164/100 | HR 89 | Temp 97.3°F | Ht 72.0 in | Wt 261.0 lb

## 2023-01-05 DIAGNOSIS — F5104 Psychophysiologic insomnia: Secondary | ICD-10-CM | POA: Diagnosis not present

## 2023-01-05 DIAGNOSIS — I10 Essential (primary) hypertension: Secondary | ICD-10-CM

## 2023-01-05 DIAGNOSIS — G250 Essential tremor: Secondary | ICD-10-CM

## 2023-01-05 DIAGNOSIS — I714 Abdominal aortic aneurysm, without rupture, unspecified: Secondary | ICD-10-CM | POA: Diagnosis not present

## 2023-01-05 DIAGNOSIS — M159 Polyosteoarthritis, unspecified: Secondary | ICD-10-CM | POA: Diagnosis not present

## 2023-01-05 DIAGNOSIS — E538 Deficiency of other specified B group vitamins: Secondary | ICD-10-CM

## 2023-01-05 DIAGNOSIS — N1831 Chronic kidney disease, stage 3a: Secondary | ICD-10-CM

## 2023-01-05 DIAGNOSIS — G8929 Other chronic pain: Secondary | ICD-10-CM | POA: Diagnosis not present

## 2023-01-05 DIAGNOSIS — R413 Other amnesia: Secondary | ICD-10-CM

## 2023-01-05 DIAGNOSIS — K279 Peptic ulcer, site unspecified, unspecified as acute or chronic, without hemorrhage or perforation: Secondary | ICD-10-CM | POA: Diagnosis not present

## 2023-01-05 DIAGNOSIS — Z66 Do not resuscitate: Secondary | ICD-10-CM | POA: Diagnosis not present

## 2023-01-05 DIAGNOSIS — G3184 Mild cognitive impairment, so stated: Secondary | ICD-10-CM

## 2023-01-05 DIAGNOSIS — M150 Primary generalized (osteo)arthritis: Secondary | ICD-10-CM

## 2023-01-05 LAB — CBC WITH DIFFERENTIAL/PLATELET
Basophils Absolute: 0.2 10*3/uL — ABNORMAL HIGH (ref 0.0–0.1)
Basophils Relative: 3.1 % — ABNORMAL HIGH (ref 0.0–3.0)
Eosinophils Absolute: 0.7 10*3/uL (ref 0.0–0.7)
Eosinophils Relative: 9.3 % — ABNORMAL HIGH (ref 0.0–5.0)
HCT: 41.8 % (ref 39.0–52.0)
Hemoglobin: 14.2 g/dL (ref 13.0–17.0)
Lymphocytes Relative: 34.6 % (ref 12.0–46.0)
Lymphs Abs: 2.6 10*3/uL (ref 0.7–4.0)
MCHC: 33.9 g/dL (ref 30.0–36.0)
MCV: 92.6 fl (ref 78.0–100.0)
Monocytes Absolute: 0.6 10*3/uL (ref 0.1–1.0)
Monocytes Relative: 8 % (ref 3.0–12.0)
Neutro Abs: 3.4 10*3/uL (ref 1.4–7.7)
Neutrophils Relative %: 45 % (ref 43.0–77.0)
Platelets: 302 10*3/uL (ref 150.0–400.0)
RBC: 4.52 Mil/uL (ref 4.22–5.81)
RDW: 15.7 % — ABNORMAL HIGH (ref 11.5–15.5)
WBC: 7.5 10*3/uL (ref 4.0–10.5)

## 2023-01-05 LAB — TSH: TSH: 2.26 u[IU]/mL (ref 0.35–5.50)

## 2023-01-05 LAB — RENAL FUNCTION PANEL
Albumin: 4.1 g/dL (ref 3.5–5.2)
BUN: 21 mg/dL (ref 6–23)
CO2: 32 mEq/L (ref 19–32)
Calcium: 10.4 mg/dL (ref 8.4–10.5)
Chloride: 99 mEq/L (ref 96–112)
Creatinine, Ser: 1.07 mg/dL (ref 0.40–1.50)
GFR: 63.39 mL/min (ref >60.00)
Glucose, Bld: 107 mg/dL — ABNORMAL HIGH (ref 70–99)
Phosphorus: 2.9 mg/dL (ref 2.3–4.6)
Potassium: 4.4 mEq/L (ref 3.5–5.1)
Sodium: 137 mEq/L (ref 135–145)

## 2023-01-05 LAB — VITAMIN B12: Vitamin B-12: 700 pg/mL (ref 211–911)

## 2023-01-05 NOTE — Progress Notes (Unsigned)
Ph: [phone]       Fax: [phone]   Patient ID: Anthony [name], male    DOB: [birth year], 87 y.o.   MRN: [id number]  This visit was conducted in person.  BP (!) 164/100   Pulse 89   Temp (!) 97.3 F (36.3 C) (Temporal)   Ht 6' (1.829 m)   Wt 261 lb (118.4 kg)   SpO2 95%   BMI 35.40 kg/m    CC: follow up visit  Subjective:   HPI: Anthony [name] is a 87 y.o. male presenting on 01/05/2023 for Medical Management of Chronic Issues (Here for pain mgmt f/u. Pt accompanied by son, [name]. )   Here with son Dr Jamse [place]. Resides in [facility].   Has had several falls in last few weeks, largely without injury. Has slipped off of his scooter. Had fall 12/15/2022 where he injured toenail. Recently referred to [facility] home health for PT and OT. Needed face to face visit for [facility] eval.   Continues oxycodone 10mg  BID PRN #30, last filled 12/08/2022. Chronic back pain due to severe DDD. He also continues meloxicam 7.5mg  daily, gabapentin 600mg  nightly with extra during day as needed, and cymbalta 20mg  daily. Doing better with regular lidocaine patch use.   NextWave triple electrotherapy device (interferential, TENS, NMES) - has not yet started.   HTN - BP elevated today. Has not been checking at SNF. Continues toprol XL 25mg  daily and lasix 40mg  daily. Denies HA, vision changes, CP/tightness, SOB, leg swelling. BP checked monthly at [facility], latest on Sunday 132/88.   Last saw ID 08/2022 for staph capitis bacteremia, epidural abscess to L1/2 with bilateral facet septic arthritis L4/5 and osteomyelitis/disciitis of L5/S1 s/p hospitalization 04/2022 . Spine stimulator removed at that time, s/p L2/3 and L5/S1 laminectomy. Completed 6 wks IV cefazolin followed by oral keflex for 6 wks. Currently off antibiotics. Also had upper GIB, EGD showed gastric and duodenal ulcers, currently continues pantoprazole 40mg  daily.   No h/o kidney stones.   Here for geriatric assessment as well today.  Geriatric  Assessment: Activities of Daily Living:     Bathing- independent    Dressing- independent    Eating- independent    Toileting- independent    Transferring- independent    Continence- independent Overall Assessment: independent  Instrumental Activities of Daily Living:     Transportation- dependent    Meal/Food Preparation- dependent    Shopping Errands- dependent    Housekeeping/Chores- dependent    Money Management/Finances- dependent    Medication Management- dependent    Ability to Use Telephone- dependent    Laundry- dependent Overall Assessment: dependent   Mental Status Exam: 16/30 (value/max value) Clock Drawing Score: 0/4         Relevant past medical, surgical, family and social history reviewed and updated as indicated. Interim medical history since our last visit reviewed. Allergies and medications reviewed and updated. Outpatient Medications Prior to Visit  Medication Sig Dispense Refill   acetaminophen (TYLENOL) 500 MG tablet Take 1 tablet (500 mg total) by mouth 2 (two) times daily as needed for moderate pain.     Alirocumab (PRALUENT) 75 MG/ML SOAJ INJECT 75MG  IN THE SKIN EVERY 14 DAYS 2 mL 5   aspirin EC 81 MG tablet Take 1 tablet (81 mg total) by mouth daily.     ciclopirox (PENLAC) 8 % solution APPLY TOPICALLY AT BEDTIME APPLY OVER NAIL AND SURROUNDING SKIN, APPLY DAILY OVER PREVIOUS COAT, AFTER 7 DAYS MAY REMOVE WITH  ALCOHOL AND REPEAT 6.6 mL 11   Cyanocobalamin (B-12) 1000 MCG SUBL Place 1 tablet under the tongue daily.     diphenhydramine-acetaminophen (TYLENOL PM) 25-500 MG TABS tablet Take 2 tablets by mouth at bedtime.     docusate sodium (COLACE) 100 MG capsule Take 100 mg by mouth daily.     DULoxetine (CYMBALTA) 20 MG capsule Take 1 capsule (20 mg total) by mouth daily.     Eszopiclone 3 MG TABS Take 1 tablet (3 mg total) by mouth at bedtime as needed. Use sparingly, take immediately before bedtime 30 tablet 0   ezetimibe (ZETIA) 10 MG tablet Take 1  tablet (10 mg total) by mouth daily. 90 tablet 3   finasteride (PROSCAR) 5 MG tablet TAKE 1 TABLET(5 MG) BY MOUTH DAILY 90 tablet 0   fluocinolone (SYNALAR) 0.025 % cream Apply topically 2 (two) times daily as needed.     furosemide (LASIX) 40 MG tablet Take 1 tablet (40 mg total) by mouth daily.     gabapentin (NEURONTIN) 300 MG capsule TAKE 2 CAPSULE BY MOUTH AT BEDTIME WITH EXTRA CAPSULE DURING THE DAY AS NEEDED 200 capsule 1   hydrocortisone 2.5 % cream APPLY TO FACE AT BEDTIME TUES, THURS, SAT 30 g 6   ketoconazole (NIZORAL) 2 % cream APPLY TO FACE AT BEDTIME MON, WED, FRI 60 g 3   ketoconazole (NIZORAL) 2 % shampoo APPLY TOPICALLY 3 TIMES WEEKLY AS DIRECTED 120 mL 2   levothyroxine (SYNTHROID) 175 MCG tablet Take 1 tablet (175 mcg total) by mouth daily before breakfast. 90 tablet 1   LINZESS 290 MCG CAPS capsule TAKE 1 CAPSULE(290 MCG) BY MOUTH DAILY BEFORE BREAKFAST 90 capsule 1   Melatonin 10 MG CAPS Take 10 mg by mouth at bedtime.     meloxicam (MOBIC) 7.5 MG tablet Take 7.5 mg by mouth daily.     metoprolol succinate (TOPROL-XL) 50 MG 24 hr tablet Take 1 tablet (50 mg total) by mouth daily.     Multiple Vitamins-Minerals (CENTRUM SILVER PO) Take 1 tablet by mouth daily.     Multiple Vitamins-Minerals (PRESERVISION AREDS 2 PO) Take 1 tablet by mouth daily.     nitroGLYCERIN (NITROSTAT) 0.4 MG SL tablet Place 1 tablet (0.4 mg total) under the tongue every 5 (five) minutes as needed for chest pain. 25 tablet 0   ondansetron (ZOFRAN) 4 MG tablet Take 4 mg by mouth every 6 (six) hours as needed for nausea.     Oxycodone HCl 10 MG TABS Take 1 tablet (10 mg total) by mouth 2 (two) times daily as needed (moderate pain). 30 tablet 0   pantoprazole (PROTONIX) 40 MG tablet Take 1 tablet (40 mg total) by mouth daily.     polyethylene glycol (MIRALAX / GLYCOLAX) 17 g packet Take 17 g by mouth daily.     senna (SENOKOT) 8.6 MG tablet Take 1 tablet (8.6 mg total) by mouth daily. 30 tablet 3    sucralfate (CARAFATE) 1 g tablet Take 1 g by mouth 2 (two) times daily.     tamsulosin (FLOMAX) 0.4 MG CAPS capsule Take 1 capsule (0.4 mg total) by mouth daily. 90 capsule 3   VASCEPA 1 g capsule Take 2 capsules (2 g total) by mouth 2 (two) times daily. 360 capsule 2   Vitamin D, Cholecalciferol, 25 MCG (1000 UT) TABS Take 1 tablet by mouth daily.     cephALEXin (KEFLEX) 500 MG capsule Take 1,000 mg by mouth every 6 (six) hours.  metoprolol succinate (TOPROL-XL) 25 MG 24 hr tablet Take 1 tablet (25 mg total) by mouth daily. 30 tablet 0   No facility-administered medications prior to visit.     Per HPI unless specifically indicated in ROS section below Review of Systems  Objective:  BP (!) 164/100   Pulse 89   Temp (!) 97.3 F (36.3 C) (Temporal)   Ht 6' (1.829 m)   Wt 261 lb (118.4 kg)   SpO2 95%   BMI 35.40 kg/m   Wt Readings from Last 3 Encounters:  01/05/23 261 lb (118.4 kg)  08/30/22 254 lb (115.2 kg)  08/24/22 252 lb 2 oz (114.4 kg)      Physical Exam Vitals and nursing note reviewed.  Constitutional:      Appearance: Normal appearance. He is not ill-appearing.     Comments: Unsteady ambulates with walker  HENT:     Head: Normocephalic and atraumatic.     Mouth/Throat:     Mouth: Mucous membranes are moist.     Pharynx: Oropharynx is clear. No oropharyngeal exudate or posterior oropharyngeal erythema.  Eyes:     Extraocular Movements: Extraocular movements intact.     Pupils: Pupils are equal, round, and reactive to light.  Cardiovascular:     Rate and Rhythm: Normal rate and regular rhythm.     Pulses: Normal pulses.     Heart sounds: Normal heart sounds. No murmur heard. Pulmonary:     Effort: Pulmonary effort is normal. No respiratory distress.     Breath sounds: Normal breath sounds. No wheezing, rhonchi or rales.  Skin:    General: Skin is warm and dry.     Findings: No rash.  Neurological:     Mental Status: He is alert.     Motor: Tremor present.   Psychiatric:        Mood and Affect: Mood normal.        Behavior: Behavior normal.       Results for orders placed or performed in visit on 01/05/23  CBC with Differential/Platelet  Result Value Ref Range   WBC 7.5 4.0 - 10.5 K/uL   RBC 4.52 4.22 - 5.81 Mil/uL   Hemoglobin 14.2 13.0 - 17.0 g/dL   HCT 29.5 62.1 - 30.8 %   MCV 92.6 78.0 - 100.0 fl   MCHC 33.9 30.0 - 36.0 g/dL   RDW 65.7 (H) 84.6 - 96.2 %   Platelets 302.0 150.0 - 400.0 K/uL   Neutrophils Relative % 45.0 43.0 - 77.0 %   Lymphocytes Relative 34.6 12.0 - 46.0 %   Monocytes Relative 8.0 3.0 - 12.0 %   Eosinophils Relative 9.3 (H) 0.0 - 5.0 %   Basophils Relative 3.1 (H) 0.0 - 3.0 %   Neutro Abs 3.4 1.4 - 7.7 K/uL   Lymphs Abs 2.6 0.7 - 4.0 K/uL   Monocytes Absolute 0.6 0.1 - 1.0 K/uL   Eosinophils Absolute 0.7 0.0 - 0.7 K/uL   Basophils Absolute 0.2 (H) 0.0 - 0.1 K/uL  TSH  Result Value Ref Range   TSH 2.26 0.35 - 5.50 uIU/mL  Vitamin B12  Result Value Ref Range   Vitamin B-12 700 211 - 911 pg/mL  Renal function panel  Result Value Ref Range   Sodium 137 135 - 145 mEq/L   Potassium 4.4 3.5 - 5.1 mEq/L   Chloride 99 96 - 112 mEq/L   CO2 32 19 - 32 mEq/L   Albumin 4.1 3.5 - 5.2 g/dL   BUN  21 6 - 23 mg/dL   Creatinine, Ser 8.65 0.40 - 1.50 mg/dL   Glucose, Bld 784 (H) 70 - 99 mg/dL   Phosphorus 2.9 2.3 - 4.6 mg/dL   GFR 69.62 >95.28 mL/min   Calcium 10.4 8.4 - 10.5 mg/dL    Assessment & Plan:   Problem List Items Addressed This Visit     Chronic insomnia    Chronic, stable period on lunesta PRN. Now off trazodone and tylenol PM.  With noted cognitive impairment, considering trial taper of lunesta.       Essential hypertension    Chronic, BP elevated in office today however readings at SNF have been overall controlled.  Will increase toprol XL to 50mg  daily, monitoring effect on HR and BP.  Monitor BP at SNF weekly for 1 month then return to monthly, fax me results to review.  BB replaced  amlodipine in h/o aflutter per EP.       Relevant Medications   metoprolol succinate (TOPROL-XL) 50 MG 24 hr tablet   CKD (chronic kidney disease) stage 3, GFR 30-59 ml/min (HCC)   Relevant Orders   CBC with Differential/Platelet (Completed)   Parathyroid hormone, intact (no Ca)   Renal function panel (Completed)   Abdominal aortic aneurysm (AAA) without rupture [facility])    S/p endovascular repair. Has [facility] f/u scheduled for next week ([facility])      Relevant Medications   metoprolol succinate (TOPROL-XL) 50 MG 24 hr tablet   Osteoarthritis   Essential tremor    Monitor effect on increased BB dose.       Encounter for chronic pain management (Chronic)    [place] CSRS reviewed. Chronic, stable period on current regimen including oxycodone, lidocaine patch, meloxicam, gabapentin and cymbalta - continue      Peptic ulcer disease    Continue indefinite PPI use.       Amnestic MCI (mild cognitive impairment with memory loss) - Primary    Discussed abnormal MMSE 16/30 and CDT (0/4) results  with pt and son - concern for significant cognitive impairment -> dementia which did worsen after recent hospitalization for severe illness. Offered brain imaging with MR r/o stroke or other cause of acute worsening in mentation during hospitalization - they decline as would not want to pursue significant treatment at this time. Overall stable period residing at [facility], son actually notes some improvement since discharge. Discussed anticholinergic medication, would hold for now given concern over effect on cardiac conduction and GI upset (in peptic ulcer history). Will monitor for now, check memory labs (TSH, B12, RPR). Reviewed 4 core strategies to foment a healthy mind.       Relevant Orders   TSH (Completed)   Vitamin B12 (Completed)   RPR   Low serum vitamin B12    Update B12 levels after starting oral supplementation.       DNR (do not resuscitate) (Chronic)    DNR confirmed with patient.  [place] DNR  order form filled out today and provided to patient.       Other Visit Diagnoses     Hypercalcemia       Relevant Orders   Parathyroid hormone, intact (no Ca)        No orders of the defined types were placed in this encounter.   Orders Placed This Encounter  Procedures   CBC with Differential/Platelet   Parathyroid hormone, intact (no Ca)   TSH   Vitamin B12   RPR   Renal function panel  Patient Instructions  Labs today Increase metoprolol XL to 50mg  daily  Check vitals once weekly for 1 month then return to monthly, fax [name] results.  Memory testing suggestive of memory difficulty ?dementia.   Continue 4 core lifestyle modifications to support a healthy mind:  1. Nutritious well balance diet.  2. Regular physical activity routine.  3. Regular mental activity such as reading books, word puzzles, math puzzles, jigsaw puzzles.  4. Social engagement.  Also ensure good blood pressure control, limit alcohol, no smoking.    Good to see you today Return in 3 months for wellness visit  Follow up plan: Return in about 4 months (around 05/08/2023) for medicare wellness visit.  Eustaquio Boyden, MD

## 2023-01-05 NOTE — Patient Instructions (Addendum)
Labs today Increase metoprolol XL to 50mg  daily  Check vitals once weekly for 1 month then return to monthly, fax [name] results.  Memory testing suggestive of memory difficulty ?dementia.   Continue 4 core lifestyle modifications to support a healthy mind:  1. Nutritious well balance diet.  2. Regular physical activity routine.  3. Regular mental activity such as reading books, word puzzles, math puzzles, jigsaw puzzles.  4. Social engagement.  Also ensure good blood pressure control, limit alcohol, no smoking.    Good to see you today Return in 3 months for wellness visit

## 2023-01-06 ENCOUNTER — Encounter: Payer: Self-pay | Admitting: Family Medicine

## 2023-01-06 DIAGNOSIS — E538 Deficiency of other specified B group vitamins: Secondary | ICD-10-CM | POA: Insufficient documentation

## 2023-01-06 DIAGNOSIS — Z66 Do not resuscitate: Secondary | ICD-10-CM | POA: Insufficient documentation

## 2023-01-06 LAB — PARATHYROID HORMONE, INTACT (NO CA): PTH: 60 pg/mL (ref 16–77)

## 2023-01-06 LAB — RPR: RPR Ser Ql: NONREACTIVE

## 2023-01-06 NOTE — Assessment & Plan Note (Addendum)
Chronic, stable period on lunesta PRN. Now off trazodone and tylenol PM.  With noted cognitive impairment, considering trial taper of lunesta.

## 2023-01-06 NOTE — Assessment & Plan Note (Signed)
Monitor effect on increased BB dose.

## 2023-01-06 NOTE — Telephone Encounter (Signed)
Face to face visit completed on Friday. Plz call [facility] to ensure they have all they need to start home health therapies

## 2023-01-06 NOTE — Assessment & Plan Note (Signed)
DNR confirmed with patient.  [place] DNR order form filled out today and provided to patient.

## 2023-01-06 NOTE — Assessment & Plan Note (Addendum)
Chronic, BP elevated in office today however readings at SNF have been overall controlled.  Will increase toprol XL to 50mg  daily, monitoring effect on HR and BP.  Monitor BP at SNF weekly for 1 month then return to monthly, fax me results to review.  BB replaced amlodipine in h/o aflutter per EP.

## 2023-01-06 NOTE — Assessment & Plan Note (Signed)
Continue indefinite PPI use.

## 2023-01-06 NOTE — Assessment & Plan Note (Signed)
S/p endovascular repair. Has [facility] f/u scheduled for next week ([facility])

## 2023-01-06 NOTE — Assessment & Plan Note (Signed)
[place] CSRS reviewed. Chronic, stable period on current regimen including oxycodone, lidocaine patch, meloxicam, gabapentin and cymbalta - continue

## 2023-01-06 NOTE — Assessment & Plan Note (Addendum)
Discussed abnormal MMSE 16/30 and CDT (0/4) results  with pt and son - concern for significant cognitive impairment -> dementia which did worsen after recent hospitalization for severe illness. Offered brain imaging with MR r/o stroke or other cause of acute worsening in mentation during hospitalization - they decline as would not want to pursue significant treatment at this time. Overall stable period residing at [facility], son actually notes some improvement since discharge. Discussed anticholinergic medication, would hold for now given concern over effect on cardiac conduction and GI upset (in peptic ulcer history). Will monitor for now, check memory labs (TSH, B12, RPR). Reviewed 4 core strategies to foment a healthy mind.

## 2023-01-06 NOTE — Assessment & Plan Note (Signed)
Update B12 levels after starting oral supplementation.

## 2023-01-08 ENCOUNTER — Other Ambulatory Visit: Payer: Self-pay

## 2023-01-08 DIAGNOSIS — N183 Chronic kidney disease, stage 3 unspecified: Secondary | ICD-10-CM | POA: Diagnosis not present

## 2023-01-08 DIAGNOSIS — G8929 Other chronic pain: Secondary | ICD-10-CM | POA: Diagnosis not present

## 2023-01-08 DIAGNOSIS — R2689 Other abnormalities of gait and mobility: Secondary | ICD-10-CM | POA: Diagnosis not present

## 2023-01-08 DIAGNOSIS — M4626 Osteomyelitis of vertebra, lumbar region: Secondary | ICD-10-CM | POA: Diagnosis not present

## 2023-01-08 DIAGNOSIS — M545 Low back pain, unspecified: Secondary | ICD-10-CM | POA: Diagnosis not present

## 2023-01-08 DIAGNOSIS — I129 Hypertensive chronic kidney disease with stage 1 through stage 4 chronic kidney disease, or unspecified chronic kidney disease: Secondary | ICD-10-CM | POA: Diagnosis not present

## 2023-01-08 NOTE — Telephone Encounter (Signed)
Spoke with [facility] relaying Dr. Timoteo Expose message. Says they do for now but will contact us if anything else needed.

## 2023-01-08 NOTE — Telephone Encounter (Signed)
Name of Medication: Oxycodone Name of Pharmacy: [facility] Last Fill or Written Date and Quantity: 12/08/22, #30 Last Office Visit and Type: 01/05/23, f/u Next Office Visit and Type: none Last Controlled Substance Agreement Date: 07/04/19 Last UDS:  07/04/19

## 2023-01-09 ENCOUNTER — Encounter (INDEPENDENT_AMBULATORY_CARE_PROVIDER_SITE_OTHER): Payer: Self-pay | Admitting: Vascular Surgery

## 2023-01-09 ENCOUNTER — Ambulatory Visit (INDEPENDENT_AMBULATORY_CARE_PROVIDER_SITE_OTHER): Payer: Medicare Other

## 2023-01-09 ENCOUNTER — Ambulatory Visit (INDEPENDENT_AMBULATORY_CARE_PROVIDER_SITE_OTHER): Payer: Medicare Other | Admitting: Vascular Surgery

## 2023-01-09 VITALS — BP 148/89 | HR 87 | Resp 18 | Ht 72.0 in | Wt 262.4 lb

## 2023-01-09 DIAGNOSIS — I10 Essential (primary) hypertension: Secondary | ICD-10-CM | POA: Diagnosis not present

## 2023-01-09 DIAGNOSIS — I714 Abdominal aortic aneurysm, without rupture, unspecified: Secondary | ICD-10-CM

## 2023-01-09 DIAGNOSIS — N1831 Chronic kidney disease, stage 3a: Secondary | ICD-10-CM | POA: Diagnosis not present

## 2023-01-09 MED ORDER — OXYCODONE HCL 10 MG PO TABS: 10.0000 mg | ORAL_TABLET | Freq: Two times a day (BID) | ORAL | 0 refills | Status: DC | PRN

## 2023-01-09 NOTE — Progress Notes (Signed)
MRN : [id number]  Anthony [name] is a 87 y.o. (20-Sep-1935) male who presents with chief complaint of  Chief Complaint  Patient presents with   Follow-up    Follow up 1 year EVAR  .  History of Present Illness: Patient returns today in follow up of his aneurysm.  He had an endovascular abdominal aortic aneurysm repair back in 2021.  He has no current aneurysm related symptoms. Specifically, the patient denies new back or abdominal pain, or signs of peripheral embolization. Duplex today shows a patent stent graft without endoleak.  The maximal sac diameter was slightly decreased at 4.4 cm in maximal diameter.  Current Outpatient Medications  Medication Sig Dispense Refill   acetaminophen (TYLENOL) 500 MG tablet Take 1 tablet (500 mg total) by mouth 2 (two) times daily as needed for moderate pain.     Alirocumab (PRALUENT) 75 MG/ML SOAJ INJECT 75MG  IN THE SKIN EVERY 14 DAYS 2 mL 5   aspirin EC 81 MG tablet Take 1 tablet (81 mg total) by mouth daily.     ciclopirox (PENLAC) 8 % solution APPLY TOPICALLY AT BEDTIME APPLY OVER NAIL AND SURROUNDING SKIN, APPLY DAILY OVER PREVIOUS COAT, AFTER 7 DAYS MAY REMOVE WITH ALCOHOL AND REPEAT 6.6 mL 11   Cyanocobalamin (B-12) 1000 MCG SUBL Place 1 tablet under the tongue daily.     diphenhydramine-acetaminophen (TYLENOL PM) 25-500 MG TABS tablet Take 2 tablets by mouth at bedtime.     docusate sodium (COLACE) 100 MG capsule Take 100 mg by mouth daily.     DULoxetine (CYMBALTA) 20 MG capsule Take 1 capsule (20 mg total) by mouth daily.     Eszopiclone 3 MG TABS Take 1 tablet (3 mg total) by mouth at bedtime as needed. Use sparingly, take immediately before bedtime 30 tablet 0   ezetimibe (ZETIA) 10 MG tablet Take 1 tablet (10 mg total) by mouth daily. 90 tablet 3   finasteride (PROSCAR) 5 MG tablet TAKE 1 TABLET(5 MG) BY MOUTH DAILY 90 tablet 0   fluocinolone (SYNALAR) 0.025 % cream Apply topically 2 (two) times daily as needed.     furosemide (LASIX) 40  MG tablet Take 1 tablet (40 mg total) by mouth daily.     gabapentin (NEURONTIN) 300 MG capsule TAKE 2 CAPSULE BY MOUTH AT BEDTIME WITH EXTRA CAPSULE DURING THE DAY AS NEEDED 200 capsule 1   hydrocortisone 2.5 % cream APPLY TO FACE AT BEDTIME TUES, THURS, SAT 30 g 6   ketoconazole (NIZORAL) 2 % cream APPLY TO FACE AT BEDTIME MON, WED, FRI 60 g 3   ketoconazole (NIZORAL) 2 % shampoo APPLY TOPICALLY 3 TIMES WEEKLY AS DIRECTED 120 mL 2   levothyroxine (SYNTHROID) 175 MCG tablet Take 1 tablet (175 mcg total) by mouth daily before breakfast. 90 tablet 1   LINZESS 290 MCG CAPS capsule TAKE 1 CAPSULE(290 MCG) BY MOUTH DAILY BEFORE BREAKFAST 90 capsule 1   Melatonin 10 MG CAPS Take 10 mg by mouth at bedtime.     meloxicam (MOBIC) 7.5 MG tablet Take 7.5 mg by mouth daily.     metoprolol succinate (TOPROL-XL) 50 MG 24 hr tablet Take 1 tablet (50 mg total) by mouth daily.     Multiple Vitamins-Minerals (CENTRUM SILVER PO) Take 1 tablet by mouth daily.     Multiple Vitamins-Minerals (PRESERVISION AREDS 2 PO) Take 1 tablet by mouth daily.     nitroGLYCERIN (NITROSTAT) 0.4 MG SL tablet Place 1 tablet (0.4 mg total) under the tongue  every 5 (five) minutes as needed for chest pain. 25 tablet 0   ondansetron (ZOFRAN) 4 MG tablet Take 4 mg by mouth every 6 (six) hours as needed for nausea.     Oxycodone HCl 10 MG TABS Take 1 tablet (10 mg total) by mouth 2 (two) times daily as needed (moderate pain). 30 tablet 0   pantoprazole (PROTONIX) 40 MG tablet Take 1 tablet (40 mg total) by mouth daily.     polyethylene glycol (MIRALAX / GLYCOLAX) 17 g packet Take 17 g by mouth daily.     senna (SENOKOT) 8.6 MG tablet Take 1 tablet (8.6 mg total) by mouth daily. 30 tablet 3   sucralfate (CARAFATE) 1 g tablet Take 1 g by mouth 2 (two) times daily.     tamsulosin (FLOMAX) 0.4 MG CAPS capsule Take 1 capsule (0.4 mg total) by mouth daily. 90 capsule 3   VASCEPA 1 g capsule Take 2 capsules (2 g total) by mouth 2 (two) times  daily. 360 capsule 2   Vitamin D, Cholecalciferol, 25 MCG (1000 UT) TABS Take 1 tablet by mouth daily.     No current facility-administered medications for this visit.    Past Medical History:  Diagnosis Date   Abscess in epidural space of lumbar spine 06/05/2022   Anosmia 1980s   Atrial flutter (HCC) 2018   had an ablation with dr. Tollie Pizza   Basal cell carcinoma 01/11/2022   L cheek - excised 04/25/22   CAD (coronary artery disease)    Cancer (HCC)    skin cancer   Chronic constipation    Chronic insomnia    Chronic lower back pain    s/p spine stimulator placement   Coag negative Staphylococcus bacteremia    Complication of anesthesia    when under general, he woke up agitated and unable to be held down   Diastolic CHF (HCC)    Discitis of lumbosacral region    History of diverticulitis 2017   History of pneumonia 2013   Hyperlipidemia    Hypertension    Hypothyroidism    Irregular heart rhythm 07/29/2022   Obesity, Class II, BMI 35-39.9, with comorbidity    OSA on CPAP    Peptic ulcer disease 07/29/2022   PONV (postoperative nausea and vomiting)    Vertebral osteomyelitis (HCC)     Past Surgical History:  Procedure Laterality Date   A-FLUTTER ABLATION N/A 05/07/2017   Procedure: A-Flutter Ablation;  Surgeon: Duke Salvia, MD;  Location: [facility] INVASIVE CV LAB;  Service: Cardiovascular;  Laterality: N/A;   CARDIOVASCULAR STRESS TEST  11/2017   no ischemia, low risk study   COLONOSCOPY  2007   normal per prior PCP records, rpt 10 yrs (Dr Rosealee Albee)   ELBOW SURGERY Right    ulnar nerve decompression.  PT DOES NOT RECALL THIS PROCEDURE   ENDOVASCULAR REPAIR/STENT GRAFT N/A 05/05/2020   Procedure: ENDOVASCULAR REPAIR/STENT GRAFT;  Surgeon: Annice Needy, MD;  Location: [facility] INVASIVE CV LAB;  Service: Cardiovascular;  Laterality: N/A;   ESOPHAGOGASTRODUODENOSCOPY (EGD) WITH PROPOFOL N/A 05/30/2022   Procedure: ESOPHAGOGASTRODUODENOSCOPY (EGD) WITH PROPOFOL;  Surgeon: Wyline Mood, MD;  Location: [facility] ENDOSCOPY;  Service: Gastroenterology;  Laterality: N/A;   LAMINECTOMY THORACIC SPINE W/ PLACEMENT SPINAL CORD STIMULATOR  08/2018   and removal of prior spine stimulator (Dr Adriana Simas)   LUMBAR LAMINECTOMY FOR EPIDURAL ABSCESS Bilateral 06/05/2022   Procedure: LUMBAR 2- L3, L5-S1 LAMINECTOMY FOR EPIDURAL ABSCESS;  Surgeon: Venetia Night, MD;  Location: [facility] ORS;  Service: Neurosurgery;  Laterality: Bilateral;   NASAL SINUS SURGERY     nasal polyps. done a long time ago   PERCUTANEOUS CORONARY STENT INTERVENTION (PCI-S)  2013   EF55%, 70% mid LAD, 99% mid RCA, mild MR, elev LVEDP, DES to mid LAD. RCA is nondominant   RADIOFREQUENCY ABLATION  06/2017   lumbar region [facility])    REPLACEMENT TOTAL KNEE BILATERAL Bilateral 2000s   SPINAL CORD STIMULATOR IMPLANT  06/2016   SPINAL CORD STIMULATOR INSERTION N/A 09/09/2018   Procedure: LUMBAR SPINAL CORD STIMULATOR LEAD AND BATTERY REMOVAL AND LAMINECTOMY FOR PLACEMENT OF PADDLE;  Surgeon: Lucy Chris, MD;  Location: [facility] ORS;  Service: Neurosurgery;  Laterality: N/A;   SPINAL CORD STIMULATOR INSERTION N/A 09/16/2018   Procedure: PLACEMENT OF SPINAL CORD STIMULATOR BATTERY;  Surgeon: Lucy Chris, MD;  Location: [facility] ORS;  Service: Neurosurgery;  Laterality: N/A;   SPINAL CORD STIMULATOR REMOVAL N/A 06/03/2022   Procedure: LUMBAR SPINAL CORD STIMULATOR REMOVAL;  Surgeon: Venetia Night, MD;  Location: [facility] ORS;  Service: Neurosurgery;  Laterality: N/A;     Social History   Tobacco Use   Smoking status: Never    Passive exposure: Never   Smokeless tobacco: Never  Vaping Use   Vaping Use: Never used  Substance Use Topics   Alcohol use: No    Comment: QUIT DRINKING   Drug use: No      Family History  Problem Relation Age of Onset   Stroke Mother    Kidney disease Father    Leukemia Brother    Stroke Sister    Diabetes Neg Hx     Allergies  Allergen Reactions   Questran [Cholestyramine]      Patient not aware of an allergy to this medicine.   Atorvastatin     Muscle pain   Gemfibrozil Other (See Comments)    Muscle pain.   Metformin And Related     Dizziness   Rosuvastatin     Muscle pain     REVIEW OF SYSTEMS (Negative unless checked)  Constitutional: [] Weight loss  [] Fever  [] Chills Cardiac: [] Chest pain   [] Chest pressure   [x] Palpitations   [] Shortness of breath when laying flat   [] Shortness of breath at rest   [x] Shortness of breath with exertion. Vascular:  [] Pain in legs with walking   [] Pain in legs at rest   [] Pain in legs when laying flat   [] Claudication   [] Pain in feet when walking  [] Pain in feet at rest  [] Pain in feet when laying flat   [] History of DVT   [] Phlebitis   [] Swelling in legs   [] Varicose veins   [] Non-healing ulcers Pulmonary:   [] Uses home oxygen   [] Productive cough   [] Hemoptysis   [] Wheeze  [] COPD   [] Asthma Neurologic:  [] Dizziness  [] Blackouts   [] Seizures   [] History of stroke   [] History of TIA  [] Aphasia   [] Temporary blindness   [] Dysphagia   [] Weakness or numbness in arms   [] Weakness or numbness in legs Musculoskeletal:  [x] Arthritis   [] Joint swelling   [] Joint pain   [] Low back pain Hematologic:  [] Easy bruising  [] Easy bleeding   [] Hypercoagulable state   [] Anemic   Gastrointestinal:  [] Blood in stool   [] Vomiting blood  [x] Gastroesophageal reflux/heartburn   [] Abdominal pain Genitourinary:  [x] Chronic kidney disease   [] Difficult urination  [] Frequent urination  [] Burning with urination   [] Hematuria Skin:  [] Rashes   [] Ulcers   [] Wounds Psychological:  [] History of anxiety   []   History of major depression.  Physical Examination  BP (!) 148/89 (BP Location: Right Arm)   Pulse 87   Resp 18   Ht 6' (1.829 m)   Wt 262 lb 6.4 oz (119 kg)   BMI 35.59 kg/m  Gen:  WD/WN, NAD Head: [place]/AT, No temporalis wasting. Ear/Nose/Throat: Hearing grossly intact, nares w/o erythema or drainage Eyes: Conjunctiva clear. Sclera  non-icteric Neck: Supple.  Trachea midline Pulmonary:  Good air movement, no use of accessory muscles.  Cardiac: RRR, no JVD Vascular:  Vessel Right Left  Radial Palpable Palpable                   Musculoskeletal: M/S 5/5 throughout.  No deformity or atrophy.  Neurologic: Sensation grossly intact in extremities.  Symmetrical.  Speech is fluent.  Psychiatric: Judgment intact, Mood & affect appropriate for pt's clinical situation. Dermatologic: No rashes or ulcers noted.  No cellulitis or open wounds.      Labs Recent Results (from the past 2160 hour(s))  CBC with Differential/Platelet     Status: Abnormal   Collection Time: 01/05/23 10:46 AM  Result Value Ref Range   WBC 7.5 4.0 - 10.5 K/uL   RBC 4.52 4.22 - 5.81 Mil/uL   Hemoglobin 14.2 13.0 - 17.0 g/dL   HCT 54.0 98.1 - 19.1 %   MCV 92.6 78.0 - 100.0 fl   MCHC 33.9 30.0 - 36.0 g/dL   RDW 47.8 (H) 29.5 - 62.1 %   Platelets 302.0 150.0 - 400.0 K/uL   Neutrophils Relative % 45.0 43.0 - 77.0 %   Lymphocytes Relative 34.6 12.0 - 46.0 %   Monocytes Relative 8.0 3.0 - 12.0 %   Eosinophils Relative 9.3 (H) 0.0 - 5.0 %   Basophils Relative 3.1 (H) 0.0 - 3.0 %   Neutro Abs 3.4 1.4 - 7.7 K/uL   Lymphs Abs 2.6 0.7 - 4.0 K/uL   Monocytes Absolute 0.6 0.1 - 1.0 K/uL   Eosinophils Absolute 0.7 0.0 - 0.7 K/uL   Basophils Absolute 0.2 (H) 0.0 - 0.1 K/uL  Parathyroid hormone, intact (no Ca)     Status: None   Collection Time: 01/05/23 10:46 AM  Result Value Ref Range   PTH 60 16 - 77 pg/mL    Comment: . Interpretive Guide    Intact PTH           Calcium ------------------    ----------           ------- Normal Parathyroid    Normal               Normal Hypoparathyroidism    Low or Low Normal    Low Hyperparathyroidism    Primary            Normal or High       High    Secondary          High                 Normal or Low    Tertiary           High                 High Non-Parathyroid    Hypercalcemia      Low or Low Normal     High .   TSH     Status: None   Collection Time: 01/05/23 10:46 AM  Result Value Ref Range   TSH 2.26 0.35 - 5.50 uIU/mL  Vitamin B12     Status: None   Collection Time: 01/05/23 10:46 AM  Result Value Ref Range   Vitamin B-12 700 211 - 911 pg/mL  RPR     Status: None   Collection Time: 01/05/23 10:46 AM  Result Value Ref Range   RPR Ser Ql NON-REACTIVE NON-REACTIVE    Comment: . No laboratory evidence of syphilis. If recent exposure is suspected, submit a new sample in 2-4 weeks. .   Renal function panel     Status: Abnormal   Collection Time: 01/05/23 10:46 AM  Result Value Ref Range   Sodium 137 135 - 145 mEq/L   Potassium 4.4 3.5 - 5.1 mEq/L   Chloride 99 96 - 112 mEq/L   CO2 32 19 - 32 mEq/L   Albumin 4.1 3.5 - 5.2 g/dL   BUN 21 6 - 23 mg/dL   Creatinine, Ser 0.98 0.40 - 1.50 mg/dL   Glucose, Bld 119 (H) 70 - 99 mg/dL   Phosphorus 2.9 2.3 - 4.6 mg/dL   GFR 14.78 >29.56 mL/min    Comment: Calculated using the CKD-EPI Creatinine Equation (2021)   Calcium 10.4 8.4 - 10.5 mg/dL    Radiology No results found.  Assessment/Plan  Abdominal aortic aneurysm (AAA) without rupture (HCC) Duplex today shows a patent stent graft without endoleak.  The maximal sac diameter was slightly decreased at 4.4 cm in maximal diameter.  Doing well almost 3 years after endovascular abdominal aortic aneurysm repair.  Continue to follow annually.  Essential hypertension blood pressure control important in reducing the progression of atherosclerotic disease and aneurysmal growth. On appropriate oral medications.   CKD (chronic kidney disease) stage 3, GFR 30-59 ml/min (HCC) Follow his aneurysm with duplex.  Avoid contrast for CT scan unless absolutely necessary.    Festus Barren, MD  01/09/2023 11:45 AM    This note was created with Dragon medical transcription system.  Any errors from dictation are purely unintentional

## 2023-01-09 NOTE — Assessment & Plan Note (Signed)
Follow his aneurysm with duplex.  Avoid contrast for CT scan unless absolutely necessary.

## 2023-01-09 NOTE — Telephone Encounter (Signed)
ERx 

## 2023-01-09 NOTE — Assessment & Plan Note (Signed)
Duplex today shows a patent stent graft without endoleak.  The maximal sac diameter was slightly decreased at 4.4 cm in maximal diameter.  Doing well almost 3 years after endovascular abdominal aortic aneurysm repair.  Continue to follow annually.

## 2023-01-09 NOTE — Assessment & Plan Note (Signed)
blood pressure control important in reducing the progression of atherosclerotic disease and aneurysmal growth. On appropriate oral medications.  

## 2023-01-15 DIAGNOSIS — M545 Low back pain, unspecified: Secondary | ICD-10-CM | POA: Diagnosis not present

## 2023-01-15 DIAGNOSIS — G8929 Other chronic pain: Secondary | ICD-10-CM | POA: Diagnosis not present

## 2023-01-15 DIAGNOSIS — M4626 Osteomyelitis of vertebra, lumbar region: Secondary | ICD-10-CM | POA: Diagnosis not present

## 2023-01-15 DIAGNOSIS — I129 Hypertensive chronic kidney disease with stage 1 through stage 4 chronic kidney disease, or unspecified chronic kidney disease: Secondary | ICD-10-CM | POA: Diagnosis not present

## 2023-01-15 DIAGNOSIS — N183 Chronic kidney disease, stage 3 unspecified: Secondary | ICD-10-CM | POA: Diagnosis not present

## 2023-01-15 DIAGNOSIS — R2689 Other abnormalities of gait and mobility: Secondary | ICD-10-CM | POA: Diagnosis not present

## 2023-01-17 DIAGNOSIS — N183 Chronic kidney disease, stage 3 unspecified: Secondary | ICD-10-CM | POA: Diagnosis not present

## 2023-01-17 DIAGNOSIS — G8929 Other chronic pain: Secondary | ICD-10-CM | POA: Diagnosis not present

## 2023-01-17 DIAGNOSIS — M4626 Osteomyelitis of vertebra, lumbar region: Secondary | ICD-10-CM | POA: Diagnosis not present

## 2023-01-17 DIAGNOSIS — I129 Hypertensive chronic kidney disease with stage 1 through stage 4 chronic kidney disease, or unspecified chronic kidney disease: Secondary | ICD-10-CM | POA: Diagnosis not present

## 2023-01-17 DIAGNOSIS — M545 Low back pain, unspecified: Secondary | ICD-10-CM | POA: Diagnosis not present

## 2023-01-17 DIAGNOSIS — R2689 Other abnormalities of gait and mobility: Secondary | ICD-10-CM | POA: Diagnosis not present

## 2023-01-20 DIAGNOSIS — R2689 Other abnormalities of gait and mobility: Secondary | ICD-10-CM | POA: Diagnosis not present

## 2023-01-20 DIAGNOSIS — M6281 Muscle weakness (generalized): Secondary | ICD-10-CM | POA: Diagnosis not present

## 2023-01-20 DIAGNOSIS — R339 Retention of urine, unspecified: Secondary | ICD-10-CM | POA: Diagnosis not present

## 2023-01-20 DIAGNOSIS — M545 Low back pain, unspecified: Secondary | ICD-10-CM | POA: Diagnosis not present

## 2023-01-20 DIAGNOSIS — R7303 Prediabetes: Secondary | ICD-10-CM | POA: Diagnosis not present

## 2023-01-20 DIAGNOSIS — M4626 Osteomyelitis of vertebra, lumbar region: Secondary | ICD-10-CM | POA: Diagnosis not present

## 2023-01-20 DIAGNOSIS — K279 Peptic ulcer, site unspecified, unspecified as acute or chronic, without hemorrhage or perforation: Secondary | ICD-10-CM | POA: Diagnosis not present

## 2023-01-20 DIAGNOSIS — G8929 Other chronic pain: Secondary | ICD-10-CM | POA: Diagnosis not present

## 2023-01-20 DIAGNOSIS — G061 Intraspinal abscess and granuloma: Secondary | ICD-10-CM | POA: Diagnosis not present

## 2023-01-20 DIAGNOSIS — N183 Chronic kidney disease, stage 3 unspecified: Secondary | ICD-10-CM | POA: Diagnosis not present

## 2023-01-20 DIAGNOSIS — Z6830 Body mass index (BMI) 30.0-30.9, adult: Secondary | ICD-10-CM | POA: Diagnosis not present

## 2023-01-20 DIAGNOSIS — Z7982 Long term (current) use of aspirin: Secondary | ICD-10-CM | POA: Diagnosis not present

## 2023-01-20 DIAGNOSIS — E669 Obesity, unspecified: Secondary | ICD-10-CM | POA: Diagnosis not present

## 2023-01-20 DIAGNOSIS — I129 Hypertensive chronic kidney disease with stage 1 through stage 4 chronic kidney disease, or unspecified chronic kidney disease: Secondary | ICD-10-CM | POA: Diagnosis not present

## 2023-01-20 DIAGNOSIS — Z9181 History of falling: Secondary | ICD-10-CM | POA: Diagnosis not present

## 2023-01-23 DIAGNOSIS — M545 Low back pain, unspecified: Secondary | ICD-10-CM | POA: Diagnosis not present

## 2023-01-23 DIAGNOSIS — M4626 Osteomyelitis of vertebra, lumbar region: Secondary | ICD-10-CM | POA: Diagnosis not present

## 2023-01-23 DIAGNOSIS — G8929 Other chronic pain: Secondary | ICD-10-CM | POA: Diagnosis not present

## 2023-01-23 DIAGNOSIS — I129 Hypertensive chronic kidney disease with stage 1 through stage 4 chronic kidney disease, or unspecified chronic kidney disease: Secondary | ICD-10-CM | POA: Diagnosis not present

## 2023-01-23 DIAGNOSIS — R2689 Other abnormalities of gait and mobility: Secondary | ICD-10-CM | POA: Diagnosis not present

## 2023-01-23 DIAGNOSIS — N183 Chronic kidney disease, stage 3 unspecified: Secondary | ICD-10-CM | POA: Diagnosis not present

## 2023-01-30 DIAGNOSIS — M4626 Osteomyelitis of vertebra, lumbar region: Secondary | ICD-10-CM | POA: Diagnosis not present

## 2023-01-30 DIAGNOSIS — N183 Chronic kidney disease, stage 3 unspecified: Secondary | ICD-10-CM | POA: Diagnosis not present

## 2023-01-30 DIAGNOSIS — R2689 Other abnormalities of gait and mobility: Secondary | ICD-10-CM | POA: Diagnosis not present

## 2023-01-30 DIAGNOSIS — I129 Hypertensive chronic kidney disease with stage 1 through stage 4 chronic kidney disease, or unspecified chronic kidney disease: Secondary | ICD-10-CM | POA: Diagnosis not present

## 2023-01-30 DIAGNOSIS — M545 Low back pain, unspecified: Secondary | ICD-10-CM | POA: Diagnosis not present

## 2023-01-30 DIAGNOSIS — G8929 Other chronic pain: Secondary | ICD-10-CM | POA: Diagnosis not present

## 2023-02-02 DIAGNOSIS — M4626 Osteomyelitis of vertebra, lumbar region: Secondary | ICD-10-CM | POA: Diagnosis not present

## 2023-02-02 DIAGNOSIS — I129 Hypertensive chronic kidney disease with stage 1 through stage 4 chronic kidney disease, or unspecified chronic kidney disease: Secondary | ICD-10-CM | POA: Diagnosis not present

## 2023-02-02 DIAGNOSIS — N183 Chronic kidney disease, stage 3 unspecified: Secondary | ICD-10-CM | POA: Diagnosis not present

## 2023-02-02 DIAGNOSIS — M545 Low back pain, unspecified: Secondary | ICD-10-CM | POA: Diagnosis not present

## 2023-02-02 DIAGNOSIS — R2689 Other abnormalities of gait and mobility: Secondary | ICD-10-CM | POA: Diagnosis not present

## 2023-02-02 DIAGNOSIS — G8929 Other chronic pain: Secondary | ICD-10-CM | POA: Diagnosis not present

## 2023-02-05 DIAGNOSIS — G8929 Other chronic pain: Secondary | ICD-10-CM | POA: Diagnosis not present

## 2023-02-05 DIAGNOSIS — R2689 Other abnormalities of gait and mobility: Secondary | ICD-10-CM | POA: Diagnosis not present

## 2023-02-05 DIAGNOSIS — I129 Hypertensive chronic kidney disease with stage 1 through stage 4 chronic kidney disease, or unspecified chronic kidney disease: Secondary | ICD-10-CM | POA: Diagnosis not present

## 2023-02-05 DIAGNOSIS — M545 Low back pain, unspecified: Secondary | ICD-10-CM | POA: Diagnosis not present

## 2023-02-05 DIAGNOSIS — N183 Chronic kidney disease, stage 3 unspecified: Secondary | ICD-10-CM | POA: Diagnosis not present

## 2023-02-05 DIAGNOSIS — M4626 Osteomyelitis of vertebra, lumbar region: Secondary | ICD-10-CM | POA: Diagnosis not present

## 2023-02-07 ENCOUNTER — Other Ambulatory Visit: Payer: Self-pay

## 2023-02-07 MED ORDER — OXYCODONE HCL 10 MG PO TABS: 10.0000 mg | ORAL_TABLET | Freq: Two times a day (BID) | ORAL | 0 refills | Status: DC | PRN

## 2023-02-07 NOTE — Telephone Encounter (Signed)
Requesting: Oxycodone 10mg  Contract: No UDS: None Last Visit: 01/05/23 Next Visit: None scheduled Last Refill: 01/09/23 #30/0  Please Advise

## 2023-02-07 NOTE — Telephone Encounter (Signed)
ERx 

## 2023-02-10 DIAGNOSIS — N183 Chronic kidney disease, stage 3 unspecified: Secondary | ICD-10-CM | POA: Diagnosis not present

## 2023-02-10 DIAGNOSIS — M545 Low back pain, unspecified: Secondary | ICD-10-CM | POA: Diagnosis not present

## 2023-02-10 DIAGNOSIS — G8929 Other chronic pain: Secondary | ICD-10-CM | POA: Diagnosis not present

## 2023-02-10 DIAGNOSIS — R2689 Other abnormalities of gait and mobility: Secondary | ICD-10-CM | POA: Diagnosis not present

## 2023-02-10 DIAGNOSIS — M4626 Osteomyelitis of vertebra, lumbar region: Secondary | ICD-10-CM | POA: Diagnosis not present

## 2023-02-10 DIAGNOSIS — I129 Hypertensive chronic kidney disease with stage 1 through stage 4 chronic kidney disease, or unspecified chronic kidney disease: Secondary | ICD-10-CM | POA: Diagnosis not present

## 2023-02-19 DIAGNOSIS — I129 Hypertensive chronic kidney disease with stage 1 through stage 4 chronic kidney disease, or unspecified chronic kidney disease: Secondary | ICD-10-CM | POA: Diagnosis not present

## 2023-02-19 DIAGNOSIS — R7303 Prediabetes: Secondary | ICD-10-CM | POA: Diagnosis not present

## 2023-02-19 DIAGNOSIS — Z6830 Body mass index (BMI) 30.0-30.9, adult: Secondary | ICD-10-CM | POA: Diagnosis not present

## 2023-02-19 DIAGNOSIS — R339 Retention of urine, unspecified: Secondary | ICD-10-CM | POA: Diagnosis not present

## 2023-02-19 DIAGNOSIS — G061 Intraspinal abscess and granuloma: Secondary | ICD-10-CM | POA: Diagnosis not present

## 2023-02-19 DIAGNOSIS — E669 Obesity, unspecified: Secondary | ICD-10-CM | POA: Diagnosis not present

## 2023-02-19 DIAGNOSIS — K279 Peptic ulcer, site unspecified, unspecified as acute or chronic, without hemorrhage or perforation: Secondary | ICD-10-CM | POA: Diagnosis not present

## 2023-02-19 DIAGNOSIS — M4626 Osteomyelitis of vertebra, lumbar region: Secondary | ICD-10-CM | POA: Diagnosis not present

## 2023-02-19 DIAGNOSIS — G8929 Other chronic pain: Secondary | ICD-10-CM | POA: Diagnosis not present

## 2023-02-19 DIAGNOSIS — Z9181 History of falling: Secondary | ICD-10-CM | POA: Diagnosis not present

## 2023-02-19 DIAGNOSIS — R2689 Other abnormalities of gait and mobility: Secondary | ICD-10-CM | POA: Diagnosis not present

## 2023-02-19 DIAGNOSIS — N183 Chronic kidney disease, stage 3 unspecified: Secondary | ICD-10-CM | POA: Diagnosis not present

## 2023-02-19 DIAGNOSIS — M6281 Muscle weakness (generalized): Secondary | ICD-10-CM | POA: Diagnosis not present

## 2023-02-19 DIAGNOSIS — Z7982 Long term (current) use of aspirin: Secondary | ICD-10-CM | POA: Diagnosis not present

## 2023-02-19 DIAGNOSIS — M545 Low back pain, unspecified: Secondary | ICD-10-CM | POA: Diagnosis not present

## 2023-02-21 DIAGNOSIS — G8929 Other chronic pain: Secondary | ICD-10-CM | POA: Diagnosis not present

## 2023-02-21 DIAGNOSIS — R2689 Other abnormalities of gait and mobility: Secondary | ICD-10-CM | POA: Diagnosis not present

## 2023-02-21 DIAGNOSIS — I129 Hypertensive chronic kidney disease with stage 1 through stage 4 chronic kidney disease, or unspecified chronic kidney disease: Secondary | ICD-10-CM | POA: Diagnosis not present

## 2023-02-21 DIAGNOSIS — M545 Low back pain, unspecified: Secondary | ICD-10-CM | POA: Diagnosis not present

## 2023-02-21 DIAGNOSIS — N183 Chronic kidney disease, stage 3 unspecified: Secondary | ICD-10-CM | POA: Diagnosis not present

## 2023-02-21 DIAGNOSIS — M4626 Osteomyelitis of vertebra, lumbar region: Secondary | ICD-10-CM | POA: Diagnosis not present

## 2023-02-23 ENCOUNTER — Telehealth: Payer: Self-pay | Admitting: Family Medicine

## 2023-02-23 NOTE — Telephone Encounter (Signed)
Received, reviewed weekly BP log from [facility] - in past 3 months 108-147/69-89.  Overall well controlled. Continue current regimen.

## 2023-02-26 DIAGNOSIS — M4626 Osteomyelitis of vertebra, lumbar region: Secondary | ICD-10-CM | POA: Diagnosis not present

## 2023-02-26 DIAGNOSIS — R2689 Other abnormalities of gait and mobility: Secondary | ICD-10-CM | POA: Diagnosis not present

## 2023-02-26 DIAGNOSIS — M545 Low back pain, unspecified: Secondary | ICD-10-CM | POA: Diagnosis not present

## 2023-02-26 DIAGNOSIS — I129 Hypertensive chronic kidney disease with stage 1 through stage 4 chronic kidney disease, or unspecified chronic kidney disease: Secondary | ICD-10-CM | POA: Diagnosis not present

## 2023-02-26 DIAGNOSIS — N183 Chronic kidney disease, stage 3 unspecified: Secondary | ICD-10-CM | POA: Diagnosis not present

## 2023-02-26 DIAGNOSIS — G8929 Other chronic pain: Secondary | ICD-10-CM | POA: Diagnosis not present

## 2023-03-05 DIAGNOSIS — M4626 Osteomyelitis of vertebra, lumbar region: Secondary | ICD-10-CM | POA: Diagnosis not present

## 2023-03-05 DIAGNOSIS — R2689 Other abnormalities of gait and mobility: Secondary | ICD-10-CM | POA: Diagnosis not present

## 2023-03-05 DIAGNOSIS — G8929 Other chronic pain: Secondary | ICD-10-CM | POA: Diagnosis not present

## 2023-03-05 DIAGNOSIS — I129 Hypertensive chronic kidney disease with stage 1 through stage 4 chronic kidney disease, or unspecified chronic kidney disease: Secondary | ICD-10-CM | POA: Diagnosis not present

## 2023-03-05 DIAGNOSIS — N183 Chronic kidney disease, stage 3 unspecified: Secondary | ICD-10-CM | POA: Diagnosis not present

## 2023-03-05 DIAGNOSIS — M545 Low back pain, unspecified: Secondary | ICD-10-CM | POA: Diagnosis not present

## 2023-03-08 ENCOUNTER — Encounter: Payer: Self-pay | Admitting: Internal Medicine

## 2023-03-12 DIAGNOSIS — G8929 Other chronic pain: Secondary | ICD-10-CM | POA: Diagnosis not present

## 2023-03-12 DIAGNOSIS — N183 Chronic kidney disease, stage 3 unspecified: Secondary | ICD-10-CM | POA: Diagnosis not present

## 2023-03-12 DIAGNOSIS — I129 Hypertensive chronic kidney disease with stage 1 through stage 4 chronic kidney disease, or unspecified chronic kidney disease: Secondary | ICD-10-CM | POA: Diagnosis not present

## 2023-03-12 DIAGNOSIS — M4626 Osteomyelitis of vertebra, lumbar region: Secondary | ICD-10-CM | POA: Diagnosis not present

## 2023-03-12 DIAGNOSIS — M545 Low back pain, unspecified: Secondary | ICD-10-CM | POA: Diagnosis not present

## 2023-03-12 DIAGNOSIS — R2689 Other abnormalities of gait and mobility: Secondary | ICD-10-CM | POA: Diagnosis not present

## 2023-03-19 DIAGNOSIS — R2689 Other abnormalities of gait and mobility: Secondary | ICD-10-CM | POA: Diagnosis not present

## 2023-03-19 DIAGNOSIS — M4626 Osteomyelitis of vertebra, lumbar region: Secondary | ICD-10-CM | POA: Diagnosis not present

## 2023-03-19 DIAGNOSIS — G8929 Other chronic pain: Secondary | ICD-10-CM | POA: Diagnosis not present

## 2023-03-19 DIAGNOSIS — M545 Low back pain, unspecified: Secondary | ICD-10-CM | POA: Diagnosis not present

## 2023-03-19 DIAGNOSIS — I129 Hypertensive chronic kidney disease with stage 1 through stage 4 chronic kidney disease, or unspecified chronic kidney disease: Secondary | ICD-10-CM | POA: Diagnosis not present

## 2023-03-19 DIAGNOSIS — N183 Chronic kidney disease, stage 3 unspecified: Secondary | ICD-10-CM | POA: Diagnosis not present

## 2023-03-21 DIAGNOSIS — M545 Low back pain, unspecified: Secondary | ICD-10-CM | POA: Diagnosis not present

## 2023-03-21 DIAGNOSIS — R7303 Prediabetes: Secondary | ICD-10-CM | POA: Diagnosis not present

## 2023-03-21 DIAGNOSIS — G061 Intraspinal abscess and granuloma: Secondary | ICD-10-CM | POA: Diagnosis not present

## 2023-03-21 DIAGNOSIS — R2689 Other abnormalities of gait and mobility: Secondary | ICD-10-CM | POA: Diagnosis not present

## 2023-03-21 DIAGNOSIS — Z7982 Long term (current) use of aspirin: Secondary | ICD-10-CM | POA: Diagnosis not present

## 2023-03-21 DIAGNOSIS — Z6830 Body mass index (BMI) 30.0-30.9, adult: Secondary | ICD-10-CM | POA: Diagnosis not present

## 2023-03-21 DIAGNOSIS — G8929 Other chronic pain: Secondary | ICD-10-CM | POA: Diagnosis not present

## 2023-03-21 DIAGNOSIS — M6281 Muscle weakness (generalized): Secondary | ICD-10-CM | POA: Diagnosis not present

## 2023-03-21 DIAGNOSIS — I129 Hypertensive chronic kidney disease with stage 1 through stage 4 chronic kidney disease, or unspecified chronic kidney disease: Secondary | ICD-10-CM | POA: Diagnosis not present

## 2023-03-21 DIAGNOSIS — E669 Obesity, unspecified: Secondary | ICD-10-CM | POA: Diagnosis not present

## 2023-03-21 DIAGNOSIS — N183 Chronic kidney disease, stage 3 unspecified: Secondary | ICD-10-CM | POA: Diagnosis not present

## 2023-03-21 DIAGNOSIS — M4626 Osteomyelitis of vertebra, lumbar region: Secondary | ICD-10-CM | POA: Diagnosis not present

## 2023-03-21 DIAGNOSIS — R339 Retention of urine, unspecified: Secondary | ICD-10-CM | POA: Diagnosis not present

## 2023-03-21 DIAGNOSIS — K279 Peptic ulcer, site unspecified, unspecified as acute or chronic, without hemorrhage or perforation: Secondary | ICD-10-CM | POA: Diagnosis not present

## 2023-03-21 DIAGNOSIS — Z9181 History of falling: Secondary | ICD-10-CM | POA: Diagnosis not present

## 2023-03-26 DIAGNOSIS — M545 Low back pain, unspecified: Secondary | ICD-10-CM | POA: Diagnosis not present

## 2023-03-26 DIAGNOSIS — R2689 Other abnormalities of gait and mobility: Secondary | ICD-10-CM | POA: Diagnosis not present

## 2023-03-26 DIAGNOSIS — I129 Hypertensive chronic kidney disease with stage 1 through stage 4 chronic kidney disease, or unspecified chronic kidney disease: Secondary | ICD-10-CM | POA: Diagnosis not present

## 2023-03-26 DIAGNOSIS — N183 Chronic kidney disease, stage 3 unspecified: Secondary | ICD-10-CM | POA: Diagnosis not present

## 2023-03-26 DIAGNOSIS — G8929 Other chronic pain: Secondary | ICD-10-CM | POA: Diagnosis not present

## 2023-03-26 DIAGNOSIS — M4626 Osteomyelitis of vertebra, lumbar region: Secondary | ICD-10-CM | POA: Diagnosis not present

## 2023-04-02 DIAGNOSIS — M4626 Osteomyelitis of vertebra, lumbar region: Secondary | ICD-10-CM | POA: Diagnosis not present

## 2023-04-02 DIAGNOSIS — G8929 Other chronic pain: Secondary | ICD-10-CM | POA: Diagnosis not present

## 2023-04-02 DIAGNOSIS — N183 Chronic kidney disease, stage 3 unspecified: Secondary | ICD-10-CM | POA: Diagnosis not present

## 2023-04-02 DIAGNOSIS — I129 Hypertensive chronic kidney disease with stage 1 through stage 4 chronic kidney disease, or unspecified chronic kidney disease: Secondary | ICD-10-CM | POA: Diagnosis not present

## 2023-04-02 DIAGNOSIS — R2689 Other abnormalities of gait and mobility: Secondary | ICD-10-CM | POA: Diagnosis not present

## 2023-04-02 DIAGNOSIS — M545 Low back pain, unspecified: Secondary | ICD-10-CM | POA: Diagnosis not present

## 2023-04-09 DIAGNOSIS — R2689 Other abnormalities of gait and mobility: Secondary | ICD-10-CM | POA: Diagnosis not present

## 2023-04-09 DIAGNOSIS — M4626 Osteomyelitis of vertebra, lumbar region: Secondary | ICD-10-CM | POA: Diagnosis not present

## 2023-04-09 DIAGNOSIS — M545 Low back pain, unspecified: Secondary | ICD-10-CM | POA: Diagnosis not present

## 2023-04-09 DIAGNOSIS — N183 Chronic kidney disease, stage 3 unspecified: Secondary | ICD-10-CM | POA: Diagnosis not present

## 2023-04-09 DIAGNOSIS — I129 Hypertensive chronic kidney disease with stage 1 through stage 4 chronic kidney disease, or unspecified chronic kidney disease: Secondary | ICD-10-CM | POA: Diagnosis not present

## 2023-04-09 DIAGNOSIS — G8929 Other chronic pain: Secondary | ICD-10-CM | POA: Diagnosis not present

## 2023-04-17 DIAGNOSIS — R2689 Other abnormalities of gait and mobility: Secondary | ICD-10-CM | POA: Diagnosis not present

## 2023-04-17 DIAGNOSIS — N183 Chronic kidney disease, stage 3 unspecified: Secondary | ICD-10-CM | POA: Diagnosis not present

## 2023-04-17 DIAGNOSIS — M4626 Osteomyelitis of vertebra, lumbar region: Secondary | ICD-10-CM | POA: Diagnosis not present

## 2023-04-17 DIAGNOSIS — G8929 Other chronic pain: Secondary | ICD-10-CM | POA: Diagnosis not present

## 2023-04-17 DIAGNOSIS — I129 Hypertensive chronic kidney disease with stage 1 through stage 4 chronic kidney disease, or unspecified chronic kidney disease: Secondary | ICD-10-CM | POA: Diagnosis not present

## 2023-04-17 DIAGNOSIS — M545 Low back pain, unspecified: Secondary | ICD-10-CM | POA: Diagnosis not present

## 2023-04-19 ENCOUNTER — Ambulatory Visit: Payer: Medicare Other

## 2023-04-19 VITALS — Wt 262.0 lb

## 2023-04-19 DIAGNOSIS — Z0000 Encounter for general adult medical examination without abnormal findings: Secondary | ICD-10-CM

## 2023-04-19 NOTE — Patient Instructions (Signed)
Anthony [name] , Thank you for taking time to come for your Medicare Wellness Visit. I appreciate your ongoing commitment to your health goals. Please review the following plan we discussed and let me know if I can assist you in the future.   Referrals/Orders/Follow-Ups/Clinician Recommendations: continue ambulation   This is a list of the screening recommended for you and due dates:  Health Maintenance  Topic Date Due   Zoster (Shingles) Vaccine (1 of 2) 10/07/1954   DTaP/Tdap/Td vaccine (3 - Tdap) 10/08/2021   COVID-19 Vaccine (5 - 2023-24 season) 04/28/2022   Flu Shot  03/29/2023   Medicare Annual Wellness Visit  04/18/2024   Pneumonia Vaccine  Completed   HPV Vaccine  Aged Out    Advanced directives: (In Chart) A copy of your advanced directives are scanned into your chart should your provider ever need it.  Next Medicare Annual Wellness Visit scheduled for next year: Yes

## 2023-04-19 NOTE — Progress Notes (Signed)
Subjective:   Anthony [name] is a 87 y.o. male who presents for Medicare Annual/Subsequent preventive examination.  Visit Complete: Virtual  I connected with  [name] on 04/19/23 by a audio enabled telemedicine application and verified that I am speaking with the correct person using two identifiers.  Patient Location: Home  Provider Location: Office/Clinic  I discussed the limitations of evaluation and management by telemedicine. The patient expressed understanding and agreed to proceed.   Vital Signs: Unable to obtain new vitals due to this being a telehealth visit.   Review of Systems     Cardiac Risk Factors include: advanced age (>63men, >110 women);hypertension;male gender;obesity (BMI >30kg/m2)     Objective:    Today's Vitals   04/19/23 1402  Weight: 262 lb (118.8 kg)   Body mass index is 35.53 kg/m.     04/19/2023    2:08 PM 06/05/2022    1:59 PM 05/30/2022   10:30 AM 05/22/2022   10:50 AM 05/08/2022    2:17 PM 04/10/2022   11:16 AM 10/30/2021    8:41 PM  Advanced Directives  Does Patient Have a Medical Advance Directive? Yes Yes Yes Yes Yes No Yes  Type of Estate agent of [place];Living will Healthcare Power of [place];Living will Healthcare Power of [place];Living will    Healthcare Power of [place];Living will  Does patient want to make changes to medical advance directive? No - Patient declined No - Patient declined  No - Patient declined No - Patient declined  No - Patient declined  Copy of Healthcare Power of Attorney in Chart? Yes - validated most recent copy scanned in chart (See row information) No - copy requested     No - copy requested  Would patient like information on creating a medical advance directive? No - Patient declined     No - Patient declined     Current Medications (verified) Outpatient Encounter Medications as of 04/19/2023  Medication Sig   acetaminophen (TYLENOL) 500 MG tablet Take 1 tablet (500 mg  total) by mouth 2 (two) times daily as needed for moderate pain.   Alirocumab (PRALUENT) 75 MG/ML SOAJ INJECT 75MG  IN THE SKIN EVERY 14 DAYS   aspirin EC 81 MG tablet Take 1 tablet (81 mg total) by mouth daily.   ciclopirox (PENLAC) 8 % solution APPLY TOPICALLY AT BEDTIME APPLY OVER NAIL AND SURROUNDING SKIN, APPLY DAILY OVER PREVIOUS COAT, AFTER 7 DAYS MAY REMOVE WITH ALCOHOL AND REPEAT   Cyanocobalamin (B-12) 1000 MCG SUBL Place 1 tablet under the tongue daily.   diphenhydramine-acetaminophen (TYLENOL PM) 25-500 MG TABS tablet Take 2 tablets by mouth at bedtime.   docusate sodium (COLACE) 100 MG capsule Take 100 mg by mouth daily.   DULoxetine (CYMBALTA) 20 MG capsule Take 1 capsule (20 mg total) by mouth daily.   Eszopiclone 3 MG TABS Take 1 tablet (3 mg total) by mouth at bedtime as needed. Use sparingly, take immediately before bedtime   ezetimibe (ZETIA) 10 MG tablet Take 1 tablet (10 mg total) by mouth daily.   finasteride (PROSCAR) 5 MG tablet TAKE 1 TABLET(5 MG) BY MOUTH DAILY   fluocinolone (SYNALAR) 0.025 % cream Apply topically 2 (two) times daily as needed.   furosemide (LASIX) 40 MG tablet Take 1 tablet (40 mg total) by mouth daily.   gabapentin (NEURONTIN) 300 MG capsule TAKE 2 CAPSULE BY MOUTH AT BEDTIME WITH EXTRA CAPSULE DURING THE DAY AS NEEDED   hydrocortisone 2.5 % cream APPLY TO  FACE AT BEDTIME TUES, THURS, SAT   ketoconazole (NIZORAL) 2 % cream APPLY TO FACE AT BEDTIME MON, WED, FRI   ketoconazole (NIZORAL) 2 % shampoo APPLY TOPICALLY 3 TIMES WEEKLY AS DIRECTED   levothyroxine (SYNTHROID) 175 MCG tablet Take 1 tablet (175 mcg total) by mouth daily before breakfast.   LINZESS 290 MCG CAPS capsule TAKE 1 CAPSULE(290 MCG) BY MOUTH DAILY BEFORE BREAKFAST   Melatonin 10 MG CAPS Take 10 mg by mouth at bedtime.   meloxicam (MOBIC) 7.5 MG tablet Take 7.5 mg by mouth daily.   metoprolol succinate (TOPROL-XL) 50 MG 24 hr tablet Take 1 tablet (50 mg total) by mouth daily.   Multiple  Vitamins-Minerals (CENTRUM SILVER PO) Take 1 tablet by mouth daily.   Multiple Vitamins-Minerals (PRESERVISION AREDS 2 PO) Take 1 tablet by mouth daily.   nitroGLYCERIN (NITROSTAT) 0.4 MG SL tablet Place 1 tablet (0.4 mg total) under the tongue every 5 (five) minutes as needed for chest pain.   ondansetron (ZOFRAN) 4 MG tablet Take 4 mg by mouth every 6 (six) hours as needed for nausea.   Oxycodone HCl 10 MG TABS Take 1 tablet (10 mg total) by mouth 2 (two) times daily as needed (moderate pain).   pantoprazole (PROTONIX) 40 MG tablet Take 1 tablet (40 mg total) by mouth daily.   polyethylene glycol (MIRALAX / GLYCOLAX) 17 g packet Take 17 g by mouth daily.   senna (SENOKOT) 8.6 MG tablet Take 1 tablet (8.6 mg total) by mouth daily.   sucralfate (CARAFATE) 1 g tablet Take 1 g by mouth 2 (two) times daily.   tamsulosin (FLOMAX) 0.4 MG CAPS capsule Take 1 capsule (0.4 mg total) by mouth daily.   VASCEPA 1 g capsule Take 2 capsules (2 g total) by mouth 2 (two) times daily.   Vitamin D, Cholecalciferol, 25 MCG (1000 UT) TABS Take 1 tablet by mouth daily.   No facility-administered encounter medications on file as of 04/19/2023.    Allergies (verified) Questran [cholestyramine], Atorvastatin, Gemfibrozil, Metformin and related, and Rosuvastatin   History: Past Medical History:  Diagnosis Date   Abscess in epidural space of lumbar spine 06/05/2022   Anosmia 1980s   Atrial flutter (HCC) 2018   had an ablation with dr. Tollie Pizza   Basal cell carcinoma 01/11/2022   L cheek - excised 04/25/22   CAD (coronary artery disease)    Cancer (HCC)    skin cancer   Chronic constipation    Chronic insomnia    Chronic lower back pain    s/p spine stimulator placement   Coag negative Staphylococcus bacteremia    Complication of anesthesia    when under general, he woke up agitated and unable to be held down   Diastolic CHF (HCC)    Discitis of lumbosacral region    History of diverticulitis 2017    History of pneumonia 2013   Hyperlipidemia    Hypertension    Hypothyroidism    Irregular heart rhythm 07/29/2022   Obesity, Class II, BMI 35-39.9, with comorbidity    OSA on CPAP    Peptic ulcer disease 07/29/2022   PONV (postoperative nausea and vomiting)    Vertebral osteomyelitis (HCC)    Past Surgical History:  Procedure Laterality Date   A-FLUTTER ABLATION N/A 05/07/2017   Procedure: A-Flutter Ablation;  Surgeon: Duke Salvia, MD;  Location: [facility] INVASIVE CV LAB;  Service: Cardiovascular;  Laterality: N/A;   CARDIOVASCULAR STRESS TEST  11/2017   no ischemia, low risk study  COLONOSCOPY  2007   normal per prior PCP records, rpt 10 yrs (Dr Rosealee Albee)   ELBOW SURGERY Right    ulnar nerve decompression.  PT DOES NOT RECALL THIS PROCEDURE   ENDOVASCULAR REPAIR/STENT GRAFT N/A 05/05/2020   Procedure: ENDOVASCULAR REPAIR/STENT GRAFT;  Surgeon: Annice Needy, MD;  Location: [facility] INVASIVE CV LAB;  Service: Cardiovascular;  Laterality: N/A;   ESOPHAGOGASTRODUODENOSCOPY (EGD) WITH PROPOFOL N/A 05/30/2022   Procedure: ESOPHAGOGASTRODUODENOSCOPY (EGD) WITH PROPOFOL;  Surgeon: Wyline Mood, MD;  Location: [facility] ENDOSCOPY;  Service: Gastroenterology;  Laterality: N/A;   LAMINECTOMY THORACIC SPINE W/ PLACEMENT SPINAL CORD STIMULATOR  08/2018   and removal of prior spine stimulator (Dr Adriana Simas)   LUMBAR LAMINECTOMY FOR EPIDURAL ABSCESS Bilateral 06/05/2022   Procedure: LUMBAR 2- L3, L5-S1 LAMINECTOMY FOR EPIDURAL ABSCESS;  Surgeon: Venetia Night, MD;  Location: [facility] ORS;  Service: Neurosurgery;  Laterality: Bilateral;   NASAL SINUS SURGERY     nasal polyps. done a long time ago   PERCUTANEOUS CORONARY STENT INTERVENTION (PCI-S)  2013   EF55%, 70% mid LAD, 99% mid RCA, mild MR, elev LVEDP, DES to mid LAD. RCA is nondominant   RADIOFREQUENCY ABLATION  06/2017   lumbar region [facility])    REPLACEMENT TOTAL KNEE BILATERAL Bilateral 2000s   SPINAL CORD STIMULATOR IMPLANT  06/2016   SPINAL CORD  STIMULATOR INSERTION N/A 09/09/2018   Procedure: LUMBAR SPINAL CORD STIMULATOR LEAD AND BATTERY REMOVAL AND LAMINECTOMY FOR PLACEMENT OF PADDLE;  Surgeon: Lucy Chris, MD;  Location: [facility] ORS;  Service: Neurosurgery;  Laterality: N/A;   SPINAL CORD STIMULATOR INSERTION N/A 09/16/2018   Procedure: PLACEMENT OF SPINAL CORD STIMULATOR BATTERY;  Surgeon: Lucy Chris, MD;  Location: [facility] ORS;  Service: Neurosurgery;  Laterality: N/A;   SPINAL CORD STIMULATOR REMOVAL N/A 06/03/2022   Procedure: LUMBAR SPINAL CORD STIMULATOR REMOVAL;  Surgeon: Venetia Night, MD;  Location: [facility] ORS;  Service: Neurosurgery;  Laterality: N/A;   Family History  Problem Relation Age of Onset   Stroke Mother    Kidney disease Father    Leukemia Brother    Stroke Sister    Diabetes Neg Hx    Social History   Socioeconomic History   Marital status: Widowed    Spouse name: Not on file   Number of children: Not on file   Years of education: Not on file   Highest education level: Not on file  Occupational History   Not on file  Tobacco Use   Smoking status: Never    Passive exposure: Never   Smokeless tobacco: Never  Vaping Use   Vaping status: Never Used  Substance and Sexual Activity   Alcohol use: No    Comment: QUIT DRINKING   Drug use: No   Sexual activity: Not Currently  Other Topics Concern   Not on file  Social History Narrative   Widower (wife passed 08/2014).    Lives alone, GF Miss Yehuda Mao sometimes (she was nurse)   Son is Dr Mila Merry (FP at [facility]). Other son is a Careers adviser in [place]    Occ: retired, used to [employer] store   Edu: HS      Non-smoking; no alcohol. Lives in [place]; self.       Social Determinants of Health   Financial Resource Strain: Low Risk  (04/19/2023)   Overall Financial Resource Strain (CARDIA)    Difficulty of Paying Living Expenses: Not hard at all  Food Insecurity: No Food Insecurity (04/19/2023)   Hunger Vital Sign  Worried  About Programme researcher, broadcasting/film/video in the Last Year: Never true    Ran Out of Food in the Last Year: Never true  Transportation Needs: No Transportation Needs (04/19/2023)   PRAPARE - Administrator, Civil Service (Medical): No    Lack of Transportation (Non-Medical): No  Physical Activity: Insufficiently Active (04/19/2023)   Exercise Vital Sign    Days of Exercise per Week: 7 days    Minutes of Exercise per Session: 10 min  Stress: No Stress Concern Present (04/19/2023)   [employer] of Occupational Health - Occupational Stress Questionnaire    Feeling of Stress : Not at all  Social Connections: Socially Isolated (04/19/2023)   Social Connection and Isolation Panel [NHANES]    Frequency of Communication with Friends and Family: More than three times a week    Frequency of Social Gatherings with Friends and Family: Once a week    Attends Religious Services: Never    Database administrator or Organizations: No    Attends Banker Meetings: Never    Marital Status: Widowed    Tobacco Counseling Counseling given: Not Answered   Clinical Intake:  Pre-visit preparation completed: Yes  Pain : No/denies pain     BMI - recorded: 35.53 Nutritional Status: BMI > 30  Obese Nutritional Risks: None Diabetes: No  How often do you need to have someone help you when you read instructions, pamphlets, or other written materials from your doctor or pharmacy?: 1 - Never  Interpreter Needed?: No  Information entered by :: Lanier Ensign, LPN   Activities of Daily Living    04/19/2023    2:03 PM 05/23/2022    7:02 AM  In your present state of health, do you have any difficulty performing the following activities:  Hearing? 0 0  Vision? 0 0  Difficulty concentrating or making decisions? 0 0  Walking or climbing stairs? 0 1  Dressing or bathing? 0 0  Doing errands, shopping? 0 0  Preparing Food and eating ? N   Using the Toilet? N   In the past six months, have you  accidently leaked urine? N   Do you have problems with loss of bowel control? N   Managing your Medications? N   Managing your Finances? N   Housekeeping or managing your Housekeeping? N     Patient Care Team: Eustaquio Boyden, MD as PCP - General (Family Medicine) Edward Jolly, MD as Consulting Physician (Pain Medicine) Duke Salvia, MD as Consulting Physician (Cardiology) Kathyrn Sheriff, [facility] (Inactive) as Pharmacist (Pharmacist)  Indicate any recent Medical Services you may have received from other than [facility] providers in the past year (date may be approximate).     Assessment:   This is a routine wellness examination for F[place]kes.  Hearing/Vision screen Hearing Screening - Comments:: Pt denies any hearing issues   Dietary issues and exercise activities discussed:     Goals Addressed             This Visit's Progress    Patient Stated       Continue to ambulation       Depression Screen    04/19/2023    2:08 PM 01/05/2023    9:48 AM 04/10/2022   11:16 AM 12/28/2021   11:19 AM 12/15/2020    1:58 PM 10/30/2019    2:50 PM 10/24/2018   11:05 AM  PHQ 2/9 Scores  PHQ - 2 Score 0 0 0 3 0  0 0  PHQ- 9 Score  2   0 0 0    Fall Risk    04/19/2023    2:09 PM 01/05/2023    9:48 AM 05/08/2022    2:16 PM 04/10/2022   11:16 AM 02/15/2022    8:44 AM  Fall Risk   Falls in the past year? 1 1 1  0 0  Number falls in past yr: 1 1 0    Injury with Fall? 0 0 0    Risk for fall due to : Impaired vision;Impaired mobility;Impaired balance/gait;History of fall(s)      Follow up Falls prevention discussed        MEDICARE RISK AT HOME: Medicare Risk at Home Any stairs in or around the home?: No If so, are there any without handrails?: No Home free of loose throw rugs in walkways, pet beds, electrical cords, etc?: Yes Adequate lighting in your home to reduce risk of falls?: Yes Life alert?: Yes Use of a cane, walker or w/c?: Yes Grab bars in the bathroom?: Yes Shower chair or  bench in shower?: Yes Elevated toilet seat or a handicapped toilet?: Yes  TIMED UP AND GO:  Was the test performed?  No    Cognitive Function:    12/15/2020    2:04 PM 10/30/2019    2:53 PM 10/24/2018   11:13 AM 10/11/2017   10:45 AM  MMSE - Mini Mental State Exam  Not completed: Refused     Orientation to time  5 5 5   Orientation to Place  5 5 5   Registration  3 3 3   Attention/ Calculation  5 0 0  Recall  3 1 3   Recall-comments   unable to recall 2 of 3 words   Language- name 2 objects   0 0  Language- repeat  1 1 1   Language- follow 3 step command   2 3  Language- follow 3 step command-comments   unable to follow 1 step of 3 step command   Language- read & follow direction   0 0  Write a sentence   0 0  Copy design   0 0  Total score   17 20        Immunizations Immunization History  Administered Date(s) Administered   Fluad Quad(high Dose 65+) 05/07/2019, 06/30/2020   Hepatitis A, Adult 12/12/1996, 06/30/1997   Influenza Split 07/16/2006, 06/16/2008, 05/17/2009, 07/04/2010, 07/05/2011, 06/18/2012   Influenza, High Dose Seasonal PF 06/03/2015, 05/18/2016   Influenza,inj,Quad PF,6+ Mos 06/04/2013, 04/22/2014, 07/24/2017, 05/23/2018   PFIZER Comirnaty(Gray Top)Covid-19 Tri-Sucrose Vaccine 01/26/2021   PFIZER(Purple Top)SARS-COV-2 Vaccination 09/24/2019, 10/15/2019, 07/05/2020   Pneumococcal Conjugate-13 10/20/2013   Pneumococcal Polysaccharide-23 02/17/2011, 10/09/2011   Pneumococcal-Unspecified 08/28/2001   Td 08/28/2001, 10/09/2011   Zoster, Live 08/28/2013    TDAP status: Due, Education has been provided regarding the importance of this vaccine. Advised may receive this vaccine at local pharmacy or Health Dept. Aware to provide a copy of the vaccination record if obtained from local pharmacy or Health Dept. Verbalized acceptance and understanding.  Flu Vaccine status: Due, Education has been provided regarding the importance of this vaccine. Advised may receive this  vaccine at local pharmacy or Health Dept. Aware to provide a copy of the vaccination record if obtained from local pharmacy or Health Dept. Verbalized acceptance and understanding.  Pneumococcal vaccine status: Up to date  Covid-19 vaccine status: Information provided on how to obtain vaccines.   Qualifies for Shingles Vaccine? Yes   Zostavax completed No  Shingrix Completed?: No.    Education has been provided regarding the importance of this vaccine. Patient has been advised to call insurance company to determine out of pocket expense if they have not yet received this vaccine. Advised may also receive vaccine at local pharmacy or Health Dept. Verbalized acceptance and understanding.  Screening Tests Health Maintenance  Topic Date Due   Zoster Vaccines- Shingrix (1 of 2) 10/07/1954   DTaP/Tdap/Td (3 - Tdap) 10/08/2021   COVID-19 Vaccine (5 - 2023-24 season) 04/28/2022   INFLUENZA VACCINE  03/29/2023   Medicare Annual Wellness (AWV)  04/18/2024   Pneumonia Vaccine 35+ Years old  Completed   HPV VACCINES  Aged Out    Health Maintenance  Health Maintenance Due  Topic Date Due   Zoster Vaccines- Shingrix (1 of 2) 10/07/1954   DTaP/Tdap/Td (3 - Tdap) 10/08/2021   COVID-19 Vaccine (5 - 2023-24 season) 04/28/2022   INFLUENZA VACCINE  03/29/2023    Colorectal cancer screening: No longer required.    Additional Screening:    Vision Screening: Recommended annual ophthalmology exams for early detection of glaucoma and other disorders of the eye. Is the patient up to date with their annual eye exam?  No  Who is the provider or what is the name of the office in which the patient attends annual eye exams? Encouraged to follow up  If pt is not established with a provider, would they like to be referred to a provider to establish care? No .   Dental Screening: Recommended annual dental exams for proper oral hygiene   Community Resource Referral / Chronic Care Management: CRR  required this visit?  No   CCM required this visit?  No     Plan:     I have personally reviewed and noted the following in the patient's chart:   Medical and social history Use of alcohol, tobacco or illicit drugs  Current medications and supplements including opioid prescriptions. Patient is not currently taking opioid prescriptions. Functional ability and status Nutritional status Physical activity Advanced directives List of other physicians Hospitalizations, surgeries, and ER visits in previous 12 months Vitals Screenings to include cognitive, depression, and falls Referrals and appointments  In addition, I have reviewed and discussed with patient certain preventive protocols, quality metrics, and best practice recommendations. A written personalized care plan for preventive services as well as general preventive health recommendations were provided to patient.     Marzella Schlein, LPN   [id number]/61/0960   After Visit Summary: (MyChart) Due to this being a telephonic visit, the after visit summary with patients personalized plan was offered to patient via MyChart   Nurse Notes: none

## 2023-04-23 NOTE — Progress Notes (Signed)
Subjective:   Anthony [name] is a 87 y.o. male who presents for Medicare Annual/Subsequent preventive examination.  Visit Complete: Virtual  I connected with  [name] on 04/23/23 by a audio enabled telemedicine application and verified that I am speaking with the correct person using two identifiers.  Patient Location: Home  Provider Location: Office/Clinic  I discussed the limitations of evaluation and management by telemedicine. The patient expressed understanding and agreed to proceed.   Vital Signs: Unable to obtain new vitals due to this being a telehealth visit.   Review of Systems     Cardiac Risk Factors include: advanced age (>36men, >41 women);hypertension;male gender;obesity (BMI >30kg/m2)     Objective:    Today's Vitals   04/19/23 1402  Weight: 262 lb (118.8 kg)   Body mass index is 35.53 kg/m.     04/19/2023    2:08 PM 06/05/2022    1:59 PM 05/30/2022   10:30 AM 05/22/2022   10:50 AM 05/08/2022    2:17 PM 04/10/2022   11:16 AM 10/30/2021    8:41 PM  Advanced Directives  Does Patient Have a Medical Advance Directive? Yes Yes Yes Yes Yes No Yes  Type of Estate agent of [place];Living will Healthcare Power of [place];Living will Healthcare Power of [place];Living will    Healthcare Power of [place];Living will  Does patient want to make changes to medical advance directive? No - Patient declined No - Patient declined  No - Patient declined No - Patient declined  No - Patient declined  Copy of Healthcare Power of Attorney in Chart? Yes - validated most recent copy scanned in chart (See row information) No - copy requested     No - copy requested  Would patient like information on creating a medical advance directive? No - Patient declined     No - Patient declined     Current Medications (verified) Outpatient Encounter Medications as of 04/19/2023  Medication Sig   acetaminophen (TYLENOL) 500 MG tablet Take 1 tablet (500 mg  total) by mouth 2 (two) times daily as needed for moderate pain.   Alirocumab (PRALUENT) 75 MG/ML SOAJ INJECT 75MG  IN THE SKIN EVERY 14 DAYS   aspirin EC 81 MG tablet Take 1 tablet (81 mg total) by mouth daily.   ciclopirox (PENLAC) 8 % solution APPLY TOPICALLY AT BEDTIME APPLY OVER NAIL AND SURROUNDING SKIN, APPLY DAILY OVER PREVIOUS COAT, AFTER 7 DAYS MAY REMOVE WITH ALCOHOL AND REPEAT   Cyanocobalamin (B-12) 1000 MCG SUBL Place 1 tablet under the tongue daily.   diphenhydramine-acetaminophen (TYLENOL PM) 25-500 MG TABS tablet Take 2 tablets by mouth at bedtime.   docusate sodium (COLACE) 100 MG capsule Take 100 mg by mouth daily.   DULoxetine (CYMBALTA) 20 MG capsule Take 1 capsule (20 mg total) by mouth daily.   Eszopiclone 3 MG TABS Take 1 tablet (3 mg total) by mouth at bedtime as needed. Use sparingly, take immediately before bedtime   ezetimibe (ZETIA) 10 MG tablet Take 1 tablet (10 mg total) by mouth daily.   finasteride (PROSCAR) 5 MG tablet TAKE 1 TABLET(5 MG) BY MOUTH DAILY   fluocinolone (SYNALAR) 0.025 % cream Apply topically 2 (two) times daily as needed.   furosemide (LASIX) 40 MG tablet Take 1 tablet (40 mg total) by mouth daily.   gabapentin (NEURONTIN) 300 MG capsule TAKE 2 CAPSULE BY MOUTH AT BEDTIME WITH EXTRA CAPSULE DURING THE DAY AS NEEDED   hydrocortisone 2.5 % cream APPLY TO  FACE AT BEDTIME TUES, THURS, SAT   ketoconazole (NIZORAL) 2 % cream APPLY TO FACE AT BEDTIME MON, WED, FRI   ketoconazole (NIZORAL) 2 % shampoo APPLY TOPICALLY 3 TIMES WEEKLY AS DIRECTED   levothyroxine (SYNTHROID) 175 MCG tablet Take 1 tablet (175 mcg total) by mouth daily before breakfast.   LINZESS 290 MCG CAPS capsule TAKE 1 CAPSULE(290 MCG) BY MOUTH DAILY BEFORE BREAKFAST   Melatonin 10 MG CAPS Take 10 mg by mouth at bedtime.   meloxicam (MOBIC) 7.5 MG tablet Take 7.5 mg by mouth daily.   metoprolol succinate (TOPROL-XL) 50 MG 24 hr tablet Take 1 tablet (50 mg total) by mouth daily.   Multiple  Vitamins-Minerals (CENTRUM SILVER PO) Take 1 tablet by mouth daily.   Multiple Vitamins-Minerals (PRESERVISION AREDS 2 PO) Take 1 tablet by mouth daily.   nitroGLYCERIN (NITROSTAT) 0.4 MG SL tablet Place 1 tablet (0.4 mg total) under the tongue every 5 (five) minutes as needed for chest pain.   ondansetron (ZOFRAN) 4 MG tablet Take 4 mg by mouth every 6 (six) hours as needed for nausea.   Oxycodone HCl 10 MG TABS Take 1 tablet (10 mg total) by mouth 2 (two) times daily as needed (moderate pain).   pantoprazole (PROTONIX) 40 MG tablet Take 1 tablet (40 mg total) by mouth daily.   polyethylene glycol (MIRALAX / GLYCOLAX) 17 g packet Take 17 g by mouth daily.   senna (SENOKOT) 8.6 MG tablet Take 1 tablet (8.6 mg total) by mouth daily.   sucralfate (CARAFATE) 1 g tablet Take 1 g by mouth 2 (two) times daily.   tamsulosin (FLOMAX) 0.4 MG CAPS capsule Take 1 capsule (0.4 mg total) by mouth daily.   VASCEPA 1 g capsule Take 2 capsules (2 g total) by mouth 2 (two) times daily.   Vitamin D, Cholecalciferol, 25 MCG (1000 UT) TABS Take 1 tablet by mouth daily.   No facility-administered encounter medications on file as of 04/19/2023.    Allergies (verified) Questran [cholestyramine], Atorvastatin, Gemfibrozil, Metformin and related, and Rosuvastatin   History: Past Medical History:  Diagnosis Date   Abscess in epidural space of lumbar spine 06/05/2022   Anosmia 1980s   Atrial flutter (HCC) 2018   had an ablation with dr. Tollie Pizza   Basal cell carcinoma 01/11/2022   L cheek - excised 04/25/22   CAD (coronary artery disease)    Cancer (HCC)    skin cancer   Chronic constipation    Chronic insomnia    Chronic lower back pain    s/p spine stimulator placement   Coag negative Staphylococcus bacteremia    Complication of anesthesia    when under general, he woke up agitated and unable to be held down   Diastolic CHF (HCC)    Discitis of lumbosacral region    History of diverticulitis 2017    History of pneumonia 2013   Hyperlipidemia    Hypertension    Hypothyroidism    Irregular heart rhythm 07/29/2022   Obesity, Class II, BMI 35-39.9, with comorbidity    OSA on CPAP    Peptic ulcer disease 07/29/2022   PONV (postoperative nausea and vomiting)    Vertebral osteomyelitis (HCC)    Past Surgical History:  Procedure Laterality Date   A-FLUTTER ABLATION N/A 05/07/2017   Procedure: A-Flutter Ablation;  Surgeon: Duke Salvia, MD;  Location: [facility] INVASIVE CV LAB;  Service: Cardiovascular;  Laterality: N/A;   CARDIOVASCULAR STRESS TEST  11/2017   no ischemia, low risk study  COLONOSCOPY  2007   normal per prior PCP records, rpt 10 yrs (Dr Rosealee Albee)   ELBOW SURGERY Right    ulnar nerve decompression.  PT DOES NOT RECALL THIS PROCEDURE   ENDOVASCULAR REPAIR/STENT GRAFT N/A 05/05/2020   Procedure: ENDOVASCULAR REPAIR/STENT GRAFT;  Surgeon: Annice Needy, MD;  Location: [facility] INVASIVE CV LAB;  Service: Cardiovascular;  Laterality: N/A;   ESOPHAGOGASTRODUODENOSCOPY (EGD) WITH PROPOFOL N/A 05/30/2022   Procedure: ESOPHAGOGASTRODUODENOSCOPY (EGD) WITH PROPOFOL;  Surgeon: Wyline Mood, MD;  Location: [facility] ENDOSCOPY;  Service: Gastroenterology;  Laterality: N/A;   LAMINECTOMY THORACIC SPINE W/ PLACEMENT SPINAL CORD STIMULATOR  08/2018   and removal of prior spine stimulator (Dr Adriana Simas)   LUMBAR LAMINECTOMY FOR EPIDURAL ABSCESS Bilateral 06/05/2022   Procedure: LUMBAR 2- L3, L5-S1 LAMINECTOMY FOR EPIDURAL ABSCESS;  Surgeon: Venetia Night, MD;  Location: [facility] ORS;  Service: Neurosurgery;  Laterality: Bilateral;   NASAL SINUS SURGERY     nasal polyps. done a long time ago   PERCUTANEOUS CORONARY STENT INTERVENTION (PCI-S)  2013   EF55%, 70% mid LAD, 99% mid RCA, mild MR, elev LVEDP, DES to mid LAD. RCA is nondominant   RADIOFREQUENCY ABLATION  06/2017   lumbar region [facility])    REPLACEMENT TOTAL KNEE BILATERAL Bilateral 2000s   SPINAL CORD STIMULATOR IMPLANT  06/2016   SPINAL CORD  STIMULATOR INSERTION N/A 09/09/2018   Procedure: LUMBAR SPINAL CORD STIMULATOR LEAD AND BATTERY REMOVAL AND LAMINECTOMY FOR PLACEMENT OF PADDLE;  Surgeon: Lucy Chris, MD;  Location: [facility] ORS;  Service: Neurosurgery;  Laterality: N/A;   SPINAL CORD STIMULATOR INSERTION N/A 09/16/2018   Procedure: PLACEMENT OF SPINAL CORD STIMULATOR BATTERY;  Surgeon: Lucy Chris, MD;  Location: [facility] ORS;  Service: Neurosurgery;  Laterality: N/A;   SPINAL CORD STIMULATOR REMOVAL N/A 06/03/2022   Procedure: LUMBAR SPINAL CORD STIMULATOR REMOVAL;  Surgeon: Venetia Night, MD;  Location: [facility] ORS;  Service: Neurosurgery;  Laterality: N/A;   Family History  Problem Relation Age of Onset   Stroke Mother    Kidney disease Father    Leukemia Brother    Stroke Sister    Diabetes Neg Hx    Social History   Socioeconomic History   Marital status: Widowed    Spouse name: Not on file   Number of children: Not on file   Years of education: Not on file   Highest education level: Not on file  Occupational History   Not on file  Tobacco Use   Smoking status: Never    Passive exposure: Never   Smokeless tobacco: Never  Vaping Use   Vaping status: Never Used  Substance and Sexual Activity   Alcohol use: No    Comment: QUIT DRINKING   Drug use: No   Sexual activity: Not Currently  Other Topics Concern   Not on file  Social History Narrative   Widower (wife passed 08/2014).    Lives alone, GF Miss Yehuda Mao sometimes (she was nurse)   Son is Dr Mila Merry (FP at [employer]). Other son is a Careers adviser in [place]    Occ: retired, used to [employer] store   Edu: HS      Non-smoking; no alcohol. Lives in [place]; self.       Social Determinants of Health   Financial Resource Strain: Low Risk  (04/19/2023)   Overall Financial Resource Strain (CARDIA)    Difficulty of Paying Living Expenses: Not hard at all  Food Insecurity: No Food Insecurity (04/19/2023)   Hunger Vital Sign  Worried  About Programme researcher, broadcasting/film/video in the Last Year: Never true    Ran Out of Food in the Last Year: Never true  Transportation Needs: No Transportation Needs (04/19/2023)   PRAPARE - Administrator, Civil Service (Medical): No    Lack of Transportation (Non-Medical): No  Physical Activity: Insufficiently Active (04/19/2023)   Exercise Vital Sign    Days of Exercise per Week: 7 days    Minutes of Exercise per Session: 10 min  Stress: No Stress Concern Present (04/19/2023)   [employer] of Occupational Health - Occupational Stress Questionnaire    Feeling of Stress : Not at all  Social Connections: Socially Isolated (04/19/2023)   Social Connection and Isolation Panel [NHANES]    Frequency of Communication with Friends and Family: More than three times a week    Frequency of Social Gatherings with Friends and Family: Once a week    Attends Religious Services: Never    Database administrator or Organizations: No    Attends Banker Meetings: Never    Marital Status: Widowed    Tobacco Counseling Counseling given: Not Answered   Clinical Intake:  Pre-visit preparation completed: Yes  Pain : No/denies pain     BMI - recorded: 35.53 Nutritional Status: BMI > 30  Obese Nutritional Risks: None Diabetes: No  How often do you need to have someone help you when you read instructions, pamphlets, or other written materials from your doctor or pharmacy?: 1 - Never  Interpreter Needed?: No  Information entered by :: Lanier Ensign, LPN   Activities of Daily Living    04/19/2023    2:03 PM 05/23/2022    7:02 AM  In your present state of health, do you have any difficulty performing the following activities:  Hearing? 0 0  Vision? 0 0  Difficulty concentrating or making decisions? 0 0  Walking or climbing stairs? 0 1  Dressing or bathing? 0 0  Doing errands, shopping? 0 0  Preparing Food and eating ? N   Using the Toilet? N   In the past six months, have you  accidently leaked urine? N   Do you have problems with loss of bowel control? N   Managing your Medications? N   Managing your Finances? N   Housekeeping or managing your Housekeeping? N     Patient Care Team: Eustaquio Boyden, MD as PCP - General (Family Medicine) Edward Jolly, MD as Consulting Physician (Pain Medicine) Duke Salvia, MD as Consulting Physician (Cardiology) Kathyrn Sheriff, [facility] (Inactive) as Pharmacist (Pharmacist)  Indicate any recent Medical Services you may have received from other than [facility] providers in the past year (date may be approximate).     Assessment:   This is a routine wellness examination for [place].  Hearing/Vision screen Hearing Screening - Comments:: Pt denies any hearing issues   Dietary issues and exercise activities discussed:     Goals Addressed             This Visit's Progress    Patient Stated       Continue to ambulation       Depression Screen    04/19/2023    2:08 PM 01/05/2023    9:48 AM 04/10/2022   11:16 AM 12/28/2021   11:19 AM 12/15/2020    1:58 PM 10/30/2019    2:50 PM 10/24/2018   11:05 AM  PHQ 2/9 Scores  PHQ - 2 Score 0 0 0 3 0  0 0  PHQ- 9 Score  2   0 0 0    Fall Risk    04/19/2023    2:09 PM 01/05/2023    9:48 AM 05/08/2022    2:16 PM 04/10/2022   11:16 AM 02/15/2022    8:44 AM  Fall Risk   Falls in the past year? 1 1 1  0 0  Number falls in past yr: 1 1 0    Injury with Fall? 0 0 0    Risk for fall due to : Impaired vision;Impaired mobility;Impaired balance/gait;History of fall(s)      Follow up Falls prevention discussed        MEDICARE RISK AT HOME: Medicare Risk at Home Any stairs in or around the home?: No If so, are there any without handrails?: No Home free of loose throw rugs in walkways, pet beds, electrical cords, etc?: Yes Adequate lighting in your home to reduce risk of falls?: Yes Life alert?: Yes Use of a cane, walker or w/c?: Yes Grab bars in the bathroom?: Yes Shower chair or  bench in shower?: Yes Elevated toilet seat or a handicapped toilet?: Yes  TIMED UP AND GO:  Was the test performed?  No    Cognitive Function:    12/15/2020    2:04 PM 10/30/2019    2:53 PM 10/24/2018   11:13 AM 10/11/2017   10:45 AM  MMSE - Mini Mental State Exam  Not completed: Refused     Orientation to time  5 5 5   Orientation to Place  5 5 5   Registration  3 3 3   Attention/ Calculation  5 0 0  Recall  3 1 3   Recall-comments   unable to recall 2 of 3 words   Language- name 2 objects   0 0  Language- repeat  1 1 1   Language- follow 3 step command   2 3  Language- follow 3 step command-comments   unable to follow 1 step of 3 step command   Language- read & follow direction   0 0  Write a sentence   0 0  Copy design   0 0  Total score   17 20        04/19/2023    8:58 AM  6CIT Screen  What Year? 0 points  What month? 0 points  What time? 0 points  Count back from 20 0 points  Months in reverse 0 points  Repeat phrase 0 points  Total Score 0 points    Immunizations Immunization History  Administered Date(s) Administered   Fluad Quad(high Dose 65+) 05/07/2019, 06/30/2020   Hepatitis A, Adult 12/12/1996, 06/30/1997   Influenza Split 07/16/2006, 06/16/2008, 05/17/2009, 07/04/2010, 07/05/2011, 06/18/2012   Influenza, High Dose Seasonal PF 06/03/2015, 05/18/2016   Influenza,inj,Quad PF,6+ Mos 06/04/2013, 04/22/2014, 07/24/2017, 05/23/2018   PFIZER Comirnaty(Gray Top)Covid-19 Tri-Sucrose Vaccine 01/26/2021   PFIZER(Purple Top)SARS-COV-2 Vaccination 09/24/2019, 10/15/2019, 07/05/2020   Pneumococcal Conjugate-13 10/20/2013   Pneumococcal Polysaccharide-23 02/17/2011, 10/09/2011   Pneumococcal-Unspecified 08/28/2001   Td 08/28/2001, 10/09/2011   Zoster, Live 08/28/2013    TDAP status: Due, Education has been provided regarding the importance of this vaccine. Advised may receive this vaccine at local pharmacy or Health Dept. Aware to provide a copy of the vaccination  record if obtained from local pharmacy or Health Dept. Verbalized acceptance and understanding.  Flu Vaccine status: Due, Education has been provided regarding the importance of this vaccine. Advised may receive this vaccine at local pharmacy or Health Dept. Aware  to provide a copy of the vaccination record if obtained from local pharmacy or Health Dept. Verbalized acceptance and understanding.  Pneumococcal vaccine status: Up to date  Covid-19 vaccine status: Information provided on how to obtain vaccines.   Qualifies for Shingles Vaccine? Yes   Zostavax completed No   Shingrix Completed?: No.    Education has been provided regarding the importance of this vaccine. Patient has been advised to call insurance company to determine out of pocket expense if they have not yet received this vaccine. Advised may also receive vaccine at local pharmacy or Health Dept. Verbalized acceptance and understanding.  Screening Tests Health Maintenance  Topic Date Due   Zoster Vaccines- Shingrix (1 of 2) 10/07/1954   DTaP/Tdap/Td (3 - Tdap) 10/08/2021   COVID-19 Vaccine (5 - 2023-24 season) 04/28/2022   INFLUENZA VACCINE  03/29/2023   Medicare Annual Wellness (AWV)  04/18/2024   Pneumonia Vaccine 70+ Years old  Completed   HPV VACCINES  Aged Out    Health Maintenance  Health Maintenance Due  Topic Date Due   Zoster Vaccines- Shingrix (1 of 2) 10/07/1954   DTaP/Tdap/Td (3 - Tdap) 10/08/2021   COVID-19 Vaccine (5 - 2023-24 season) 04/28/2022   INFLUENZA VACCINE  03/29/2023    Colorectal cancer screening: No longer required.    Additional Screening:    Vision Screening: Recommended annual ophthalmology exams for early detection of glaucoma and other disorders of the eye. Is the patient up to date with their annual eye exam?  No  Who is the provider or what is the name of the office in which the patient attends annual eye exams? Encouraged to follow up  If pt is not established with a provider,  would they like to be referred to a provider to establish care? No .   Dental Screening: Recommended annual dental exams for proper oral hygiene   Community Resource Referral / Chronic Care Management: CRR required this visit?  No   CCM required this visit?  No     Plan:     I have personally reviewed and noted the following in the patient's chart:   Medical and social history Use of alcohol, tobacco or illicit drugs  Current medications and supplements including opioid prescriptions. Patient is not currently taking opioid prescriptions. Functional ability and status Nutritional status Physical activity Advanced directives List of other physicians Hospitalizations, surgeries, and ER visits in previous 12 months Vitals Screenings to include cognitive, depression, and falls Referrals and appointments  In addition, I have reviewed and discussed with patient certain preventive protocols, quality metrics, and best practice recommendations. A written personalized care plan for preventive services as well as general preventive health recommendations were provided to patient.     Marzella Schlein, LPN   1/61/0960   After Visit Summary: (MyChart) Due to this being a telephonic visit, the after visit summary with patients personalized plan was offered to patient via MyChart   Nurse Notes: none

## 2023-05-03 ENCOUNTER — Ambulatory Visit: Payer: Medicare Other | Admitting: Dermatology

## 2023-07-18 ENCOUNTER — Other Ambulatory Visit: Payer: Self-pay | Admitting: Internal Medicine

## 2023-07-18 DIAGNOSIS — E782 Mixed hyperlipidemia: Secondary | ICD-10-CM

## 2023-07-24 ENCOUNTER — Other Ambulatory Visit: Payer: Self-pay | Admitting: Family Medicine

## 2023-07-25 ENCOUNTER — Telehealth (INDEPENDENT_AMBULATORY_CARE_PROVIDER_SITE_OTHER): Payer: Self-pay | Admitting: Family Medicine

## 2023-07-25 DIAGNOSIS — R32 Unspecified urinary incontinence: Secondary | ICD-10-CM

## 2023-07-25 LAB — POC URINALSYSI DIPSTICK (AUTOMATED)
Bilirubin, UA: NEGATIVE
Blood, UA: NEGATIVE
Glucose, UA: NEGATIVE
Ketones, UA: NEGATIVE
Leukocytes, UA: NEGATIVE
Nitrite, UA: NEGATIVE
Protein, UA: POSITIVE — AB
Spec Grav, UA: 1.025 (ref 1.010–1.025)
Urobilinogen, UA: 0.2 U/dL
pH, UA: 5 (ref 5.0–8.0)

## 2023-07-25 NOTE — Telephone Encounter (Signed)
I signed order from [facility] for UA given increasing urinary incontinence. Someone dropped off a urine at our office this afternoon - UA done and in chart.  Plz notify pt/[facility] that UA doesn't have signs of infection or blood.  Rec OV for further evaluation of worsening incontinence.

## 2023-07-25 NOTE — Addendum Note (Signed)
Addended by: Nanci Pina on: 07/25/2023 05:14 PM   Modules accepted: Orders

## 2023-07-25 NOTE — Telephone Encounter (Signed)
Documented UA results.   Faxed form and UA results to [facility] at [phone].

## 2023-07-25 NOTE — Telephone Encounter (Signed)
Spoke with [facility] relaying Dr Timoteo Expose message. States they will inform the nurses.

## 2023-08-10 ENCOUNTER — Ambulatory Visit: Payer: Medicare Other | Admitting: Family Medicine

## 2023-08-10 ENCOUNTER — Encounter: Payer: Self-pay | Admitting: Family Medicine

## 2023-08-10 VITALS — BP 132/84 | HR 73 | Temp 98.6°F | Ht 70.75 in | Wt 278.0 lb

## 2023-08-10 DIAGNOSIS — Z7189 Other specified counseling: Secondary | ICD-10-CM

## 2023-08-10 DIAGNOSIS — E039 Hypothyroidism, unspecified: Secondary | ICD-10-CM

## 2023-08-10 DIAGNOSIS — N1831 Chronic kidney disease, stage 3a: Secondary | ICD-10-CM | POA: Diagnosis not present

## 2023-08-10 DIAGNOSIS — D721 Eosinophilia, unspecified: Secondary | ICD-10-CM

## 2023-08-10 DIAGNOSIS — N401 Enlarged prostate with lower urinary tract symptoms: Secondary | ICD-10-CM

## 2023-08-10 DIAGNOSIS — M150 Primary generalized (osteo)arthritis: Secondary | ICD-10-CM

## 2023-08-10 DIAGNOSIS — I10 Essential (primary) hypertension: Secondary | ICD-10-CM

## 2023-08-10 DIAGNOSIS — G720 Drug-induced myopathy: Secondary | ICD-10-CM

## 2023-08-10 DIAGNOSIS — Z66 Do not resuscitate: Secondary | ICD-10-CM

## 2023-08-10 DIAGNOSIS — G3184 Mild cognitive impairment, so stated: Secondary | ICD-10-CM

## 2023-08-10 DIAGNOSIS — I2510 Atherosclerotic heart disease of native coronary artery without angina pectoris: Secondary | ICD-10-CM | POA: Diagnosis not present

## 2023-08-10 DIAGNOSIS — K5909 Other constipation: Secondary | ICD-10-CM

## 2023-08-10 DIAGNOSIS — M48061 Spinal stenosis, lumbar region without neurogenic claudication: Secondary | ICD-10-CM

## 2023-08-10 DIAGNOSIS — T466X5A Adverse effect of antihyperlipidemic and antiarteriosclerotic drugs, initial encounter: Secondary | ICD-10-CM

## 2023-08-10 DIAGNOSIS — I5032 Chronic diastolic (congestive) heart failure: Secondary | ICD-10-CM

## 2023-08-10 DIAGNOSIS — M545 Low back pain, unspecified: Secondary | ICD-10-CM

## 2023-08-10 DIAGNOSIS — M5416 Radiculopathy, lumbar region: Secondary | ICD-10-CM

## 2023-08-10 DIAGNOSIS — G250 Essential tremor: Secondary | ICD-10-CM

## 2023-08-10 DIAGNOSIS — K279 Peptic ulcer, site unspecified, unspecified as acute or chronic, without hemorrhage or perforation: Secondary | ICD-10-CM

## 2023-08-10 DIAGNOSIS — E782 Mixed hyperlipidemia: Secondary | ICD-10-CM

## 2023-08-10 DIAGNOSIS — N138 Other obstructive and reflux uropathy: Secondary | ICD-10-CM

## 2023-08-10 DIAGNOSIS — T85192S Other mechanical complication of implanted electronic neurostimulator (electrode) of spinal cord, sequela: Secondary | ICD-10-CM

## 2023-08-10 DIAGNOSIS — I714 Abdominal aortic aneurysm, without rupture, unspecified: Secondary | ICD-10-CM | POA: Diagnosis not present

## 2023-08-10 DIAGNOSIS — Z9889 Other specified postprocedural states: Secondary | ICD-10-CM

## 2023-08-10 DIAGNOSIS — G8929 Other chronic pain: Secondary | ICD-10-CM | POA: Diagnosis not present

## 2023-08-10 DIAGNOSIS — K219 Gastro-esophageal reflux disease without esophagitis: Secondary | ICD-10-CM

## 2023-08-10 DIAGNOSIS — Z8679 Personal history of other diseases of the circulatory system: Secondary | ICD-10-CM

## 2023-08-10 DIAGNOSIS — E538 Deficiency of other specified B group vitamins: Secondary | ICD-10-CM

## 2023-08-10 DIAGNOSIS — F5104 Psychophysiologic insomnia: Secondary | ICD-10-CM

## 2023-08-10 DIAGNOSIS — G4733 Obstructive sleep apnea (adult) (pediatric): Secondary | ICD-10-CM

## 2023-08-10 DIAGNOSIS — E559 Vitamin D deficiency, unspecified: Secondary | ICD-10-CM

## 2023-08-10 LAB — COMPREHENSIVE METABOLIC PANEL
ALT: 14 U/L (ref 0–53)
AST: 22 U/L (ref 0–37)
Albumin: 4.1 g/dL (ref 3.5–5.2)
Alkaline Phosphatase: 79 U/L (ref 39–117)
BUN: 23 mg/dL (ref 6–23)
CO2: 32 meq/L (ref 19–32)
Calcium: 10.2 mg/dL (ref 8.4–10.5)
Chloride: 101 meq/L (ref 96–112)
Creatinine, Ser: 1.24 mg/dL (ref 0.40–1.50)
GFR: 52.89 mL/min — ABNORMAL LOW (ref >60.00)
Glucose, Bld: 122 mg/dL — ABNORMAL HIGH (ref 70–99)
Potassium: 4.3 meq/L (ref 3.5–5.1)
Sodium: 138 meq/L (ref 135–145)
Total Bilirubin: 0.6 mg/dL (ref 0.2–1.2)
Total Protein: 7.4 g/dL (ref 6.0–8.3)

## 2023-08-10 LAB — LIPID PANEL
Cholesterol: 186 mg/dL (ref 0–200)
HDL: 38 mg/dL — ABNORMAL LOW (ref >39.00)
Total CHOL/HDL Ratio: 5
Triglycerides: 446 mg/dL — ABNORMAL HIGH (ref 0.0–149.0)

## 2023-08-10 LAB — CBC WITH DIFFERENTIAL/PLATELET
Basophils Absolute: 0.2 10*3/uL — ABNORMAL HIGH (ref 0.0–0.1)
Basophils Relative: 2.3 % (ref 0.0–3.0)
Eosinophils Absolute: 0.9 10*3/uL — ABNORMAL HIGH (ref 0.0–0.7)
Eosinophils Relative: 9.9 % — ABNORMAL HIGH (ref 0.0–5.0)
HCT: 43.5 % (ref 39.0–52.0)
Hemoglobin: 14.5 g/dL (ref 13.0–17.0)
Lymphocytes Relative: 37 % (ref 12.0–46.0)
Lymphs Abs: 3.2 10*3/uL (ref 0.7–4.0)
MCHC: 33.3 g/dL (ref 30.0–36.0)
MCV: 95 fL (ref 78.0–100.0)
Monocytes Absolute: 0.8 10*3/uL (ref 0.1–1.0)
Monocytes Relative: 9.4 % (ref 3.0–12.0)
Neutro Abs: 3.6 10*3/uL (ref 1.4–7.7)
Neutrophils Relative %: 41.4 % — ABNORMAL LOW (ref 43.0–77.0)
Platelets: 256 10*3/uL (ref 150.0–400.0)
RBC: 4.58 Mil/uL (ref 4.22–5.81)
RDW: 15.5 % (ref 11.5–15.5)
WBC: 8.7 10*3/uL (ref 4.0–10.5)

## 2023-08-10 LAB — LDL CHOLESTEROL, DIRECT: Direct LDL: 63 mg/dL

## 2023-08-10 NOTE — Patient Instructions (Addendum)
You are doing well today. Continue current medicines We will schedule brain imaging.  Labs today  Good to see you today Return as needed or in 1 year for next physical

## 2023-08-10 NOTE — Progress Notes (Unsigned)
Ph: [phone] Fax: [phone]   Patient ID: Anthony [name], male    DOB: 10/10/1935, 87 y.o.   MRN: [id number]  This visit was conducted in person.  BP 132/84   Pulse 73   Temp 98.6 F (37 C) (Oral)   Ht 5' 10.75" (1.797 m)   Wt 278 lb (126.1 kg)   SpO2 93%   BMI 39.05 kg/m    Denies hearing difficulty.  CC: follow up visit  Subjective:   HPI: Anthony [name] is a 87 y.o. male presenting on 08/10/2023 for Annual Exam (MCR prt 2 [AWV- 04/19/23]. Pt accompanied by son, [name]. )   Saw health advisor 03/2023 for medicare wellness visit. Note reviewed.  Here with son Dr Sherrie Mustache.   No results found.  Flowsheet Row Clinical Support from 04/19/2023 in [facility] [place]  PHQ-2 Total Score 0          04/19/2023    2:09 PM 01/05/2023    9:48 AM 05/08/2022    2:16 PM 04/10/2022   11:16 AM 02/15/2022    8:44 AM  Fall Risk   Falls in the past year? 1 1 1  0 0  Number falls in past yr: 1 1 0    Injury with Fall? 0 0 0    Risk for fall due to : Impaired vision;Impaired mobility;Impaired balance/gait;History of fall(s)      Follow up Falls prevention discussed        Last seen 01/05/2023.  Now living in [facility] 16 lb weight gain since 12/2022 - attributes to improved appetite, 3 meals a day.   BP stable off medication - continues Toprol XL 50mg  daily. BB replaced amlodipine in h/o aflutter (by EP).   Cognitive impairment with abnormal MMSE 16/30 and CDT 0/4 (12/2022) - tapered off Lunesta, continues sleeping well. Ongoing difficulty, progressively worsening. Acutely worse since initial hospitalization. No fevers/chills, abd pain, chest pain, or significant cough. No dyspnea or wheeze. Tremor has improved. No behavioral or personality changes. No personal h/o stroke. Strong fmhx stroke.   Recent UA 06/2023 without signs of infection or blood. Did have small amt protein.  Chronic eosinophilia saw hematology thought benign cause planned  monitoring.   Notes difficulty with writing over the past year - due to shaking tremor.   OSA on autoCPAP - DME supply company is [facility] fax [phone]. Adult polysomnography test completed at [facility] 06/23/2021 (Dr Willeen Cass) - dx mild OSA with AHI 9.2/hr, RDI 24.7/hr, O2 sat nadir of 74%. Multifocal PVCs and occasional PACs also noted. Elevated PLMS index of 112.8/hr - consider re evaluating once OSA controlled.    Known severe spinal stenosis with severe foraminal stenosis bilaterally, unable to have MRI given spinal cord stimulator. Sports med [facility] recommended neurosurgery evaluation. Received steroid course through ortho, last seen 08/2021. Benefits from oral steroids. Was on oxycodone 10mg  BID, pt hesitant to use opiate due to worsening constipation in h/o same. Also continues tylenol 500mg  BID PRN, tizanidine 2mg  BID PRN and cymbalta 20mg  daily as well as lidocaine patch daily. Also continues meloxicam 7.5mg  daily and oxycodone 10mg  BID PRN,  and gabapentin 600mg  qhs.   AAA s/p endovascular repair - last saw [facility] 12/2022.   Preventative: Colon cancer screening - normal colonoscopy 2007. Denies bowel changes or blood in stool. No known fmhx colon cancer. Age out.  Prostate cancer screening - aged out  Lung cancer screening - not eligible  Flu  shot - yearly COVID vaccine Pfizer 08/2019, 09/2019, booster 06/2020, 01/2021 Td 2013 Pneumovax 2012, 2013, prevnar-13 2015 zostavax 2015  shingrix - discussed  Advanced directive discussion - sons would be HCPOA. advanced directives in chart - VYNCA. DNR - has goldenrod form.  Seat belt use discussed.  Sunscreen use discussed, no changing moles on skin. Sees derm Gwen Pounds yearly.  Non smoker Alcohol - none  Doesn't see dentist - full implants Eye exam - declines repeat. No vision trouble.  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 Interim medical history since our last visit reviewed. Allergies and medications reviewed and updated. Outpatient Medications Prior to Visit  Medication Sig Dispense Refill   acetaminophen (TYLENOL) 500 MG tablet Take 1 tablet (500 mg total) by mouth 2 (two) times daily as needed for moderate pain.     Alirocumab (PRALUENT) 75 MG/ML SOAJ ADMINISTER 75MG  UNDER THE SKIN EVERY 14 DAYS 6 mL 3   aspirin EC 81 MG tablet Take 1 tablet (81 mg total) by mouth daily.     ciclopirox (PENLAC) 8 % solution APPLY TOPICALLY AT BEDTIME APPLY OVER NAIL AND SURROUNDING SKIN, APPLY DAILY OVER PREVIOUS COAT, AFTER 7 DAYS MAY REMOVE WITH ALCOHOL AND REPEAT 6.6 mL 11   Cyanocobalamin (B-12) 1000 MCG SUBL Place 1 tablet under the tongue daily.     diphenhydramine-acetaminophen (TYLENOL PM) 25-500 MG TABS tablet Take 2 tablets by mouth at bedtime.     docusate sodium (COLACE) 100 MG capsule Take 100 mg by mouth daily.     DULoxetine (CYMBALTA) 20 MG capsule Take 1 capsule (20 mg total) by mouth daily.     ezetimibe (ZETIA) 10 MG tablet Take 1 tablet (10 mg total) by mouth daily. 90 tablet 3   finasteride (PROSCAR) 5 MG tablet TAKE 1 TABLET(5 MG) BY MOUTH DAILY 90 tablet 0   fluocinolone (SYNALAR) 0.025 % cream Apply topically 2 (two) times daily as needed.     furosemide (LASIX) 40 MG tablet Take 1 tablet (40 mg total) by mouth daily.     gabapentin (NEURONTIN) 300 MG capsule TAKE 2 CAPSULE BY MOUTH AT BEDTIME WITH EXTRA CAPSULE DURING THE DAY AS NEEDED 200 capsule 1   hydrocortisone 2.5 % cream APPLY TO FACE AT BEDTIME TUES, THURS, SAT 30 g 6   ketoconazole (NIZORAL) 2 % cream APPLY TO FACE AT BEDTIME MON, WED, FRI 60 g 3    ketoconazole (NIZORAL) 2 % shampoo APPLY TOPICALLY 3 TIMES WEEKLY AS DIRECTED 120 mL 2   levothyroxine (SYNTHROID) 175 MCG tablet Take 1 tablet (175 mcg total) by mouth daily before breakfast. 90 tablet 1   lidocaine 4 % Place 1 patch onto the skin daily. OTC     LINZESS 290 MCG CAPS capsule TAKE 1 CAPSULE(290 MCG) BY MOUTH DAILY BEFORE BREAKFAST 90 capsule 1   Melatonin 10 MG CAPS Take 10 mg by mouth at bedtime.     meloxicam (MOBIC) 7.5 MG tablet Take 7.5 mg by mouth daily.     metoprolol succinate (TOPROL-XL) 50 MG 24 hr tablet Take 1 tablet (50 mg total) by mouth daily.  Multiple Vitamins-Minerals (CENTRUM SILVER PO) Take 1 tablet by mouth daily.     Multiple Vitamins-Minerals (PRESERVISION AREDS 2 PO) Take 1 tablet by mouth daily.     nitroGLYCERIN (NITROSTAT) 0.4 MG SL tablet Place 1 tablet (0.4 mg total) under the tongue every 5 (five) minutes as needed for chest pain. 25 tablet 0   ondansetron (ZOFRAN) 4 MG tablet Take 4 mg by mouth every 6 (six) hours as needed for nausea.     Oxycodone HCl 10 MG TABS Take 1 tablet (10 mg total) by mouth 2 (two) times daily as needed (moderate pain). 30 tablet 0   pantoprazole (PROTONIX) 40 MG tablet Take 1 tablet (40 mg total) by mouth daily.     polyethylene glycol (MIRALAX / GLYCOLAX) 17 g packet Take 17 g by mouth daily.     senna (SENOKOT) 8.6 MG tablet Take 1 tablet (8.6 mg total) by mouth daily. 30 tablet 3   sucralfate (CARAFATE) 1 g tablet Take 1 g by mouth 2 (two) times daily.     tamsulosin (FLOMAX) 0.4 MG CAPS capsule Take 1 capsule (0.4 mg total) by mouth daily. 90 capsule 3   tizanidine (ZANAFLEX) 2 MG capsule Take 2 mg by mouth 2 (two) times daily as needed for muscle spasms.     VASCEPA 1 g capsule Take 2 capsules (2 g total) by mouth 2 (two) times daily. 360 capsule 2   Vitamin D, Cholecalciferol, 25 MCG (1000 UT) TABS Take 1 tablet by mouth daily.     Eszopiclone 3 MG TABS Take 1 tablet (3 mg total) by mouth at bedtime as needed. Use  sparingly, take immediately before bedtime 30 tablet 0   No facility-administered medications prior to visit.     Per HPI unless specifically indicated in ROS section below Review of Systems  Objective:  BP 132/84   Pulse 73   Temp 98.6 F (37 C) (Oral)   Ht 5' 10.75" (1.797 m)   Wt 278 lb (126.1 kg)   SpO2 93%   BMI 39.05 kg/m   Wt Readings from Last 3 Encounters:  08/10/23 278 lb (126.1 kg)  04/19/23 262 lb (118.8 kg)  01/09/23 262 lb 6.4 oz (119 kg)      Physical Exam    Results for orders placed or performed in visit on 07/25/23  POCT Urinalysis Dipstick (Automated)   Collection Time: 07/25/23  5:02 PM  Result Value Ref Range   Color, UA yellow    Clarity, UA clear    Glucose, UA Negative Negative   Bilirubin, UA negative    Ketones, UA negative    Spec Grav, UA 1.025 1.010 - 1.025   Blood, UA negative    pH, UA 5.0 5.0 - 8.0   Protein, UA Positive (A) Negative   Urobilinogen, UA 0.2 0.2 or 1.0 E.U./dL   Nitrite, UA negative    Leukocytes, UA Negative Negative    Assessment & Plan:   Problem List Items Addressed This Visit   None    No orders of the defined types were placed in this encounter.   No orders of the defined types were placed in this encounter.   There are no Patient Instructions on file for this visit.  Follow up plan: No follow-ups on file.  Eustaquio Boyden, MD

## 2023-08-14 ENCOUNTER — Encounter: Payer: Self-pay | Admitting: Family Medicine

## 2023-08-14 DIAGNOSIS — G720 Drug-induced myopathy: Secondary | ICD-10-CM | POA: Insufficient documentation

## 2023-08-14 NOTE — Assessment & Plan Note (Addendum)
Continues praluent vascepa and ezetimibe in statin intolerance. Update FLP.  H/o statin intolerance (myalgias) The ASCVD Risk score (Arnett DK, et al., 2019) failed to calculate for the following reasons:   The 2019 ASCVD risk score is only valid for ages 15 to 71

## 2023-08-14 NOTE — Assessment & Plan Note (Addendum)
This is overall improved on metoprolol -continue.

## 2023-08-14 NOTE — Assessment & Plan Note (Signed)
S/p reassuring hematology eval - rec PCP monitoring.  Update CBC

## 2023-08-14 NOTE — Assessment & Plan Note (Signed)
Continue b12 daily replacement.

## 2023-08-14 NOTE — Assessment & Plan Note (Signed)
Chronic, stable on daily levothyroxine 175 mcg.

## 2023-08-14 NOTE — Assessment & Plan Note (Addendum)
Continue nightly CPAP.  DME supply company is [facility].

## 2023-08-14 NOTE — Assessment & Plan Note (Addendum)
[place] CSRS reviewed. Continues oxycodone with other meds as per above for chronic back pain. Last refilled through local pharmacy 05/2022.

## 2023-08-14 NOTE — Assessment & Plan Note (Addendum)
Ongoing memory difficulty worse since complicated hospitalization fall 2023 for spinal infection (vertebral osteo, disciitis and spinal epidural abscess).  Memory labwork overall unrevealing (TSH, B12, RPR).  Discussed further eval with neurology referral vs brain MRI - will proceed with brain MRI.  Continue minimizing medications that could affect cognition - drop tylenol PM to 1 at night, continue to try to fully taper off

## 2023-08-14 NOTE — Assessment & Plan Note (Signed)
Discussed weight gain noted.  

## 2023-08-14 NOTE — Assessment & Plan Note (Signed)
 Continue flomax and finasteride

## 2023-08-14 NOTE — Assessment & Plan Note (Addendum)
Continues aspirin, praluent, Vascepa, ezetimibe and beta blocker.

## 2023-08-14 NOTE — Assessment & Plan Note (Signed)
Continue Linzess. Component of opiate induced.

## 2023-08-14 NOTE — Assessment & Plan Note (Signed)
Advanced directive discussion - sons would be HCPOA. advanced directives in chart - VYNCA. DNR - has goldenrod form.

## 2023-08-14 NOTE — Assessment & Plan Note (Signed)
Continue daily PPI.  

## 2023-08-14 NOTE — Assessment & Plan Note (Signed)
Continue pain management as per HPI.

## 2023-08-14 NOTE — Assessment & Plan Note (Signed)
Chronic, stable off of Lunesta and trazodone. Continues 2 Tylenol PM nightly along with melatonin and gabapentin. Recommend drop Tylenol PM to 1 at night continue trying to taper off of this anticholinergic medicine.

## 2023-08-14 NOTE — Assessment & Plan Note (Signed)
Seems euvolemic today.  

## 2023-08-14 NOTE — Assessment & Plan Note (Signed)
H/o intolerance to atorvastatin, rosuvastatin.

## 2023-08-14 NOTE — Assessment & Plan Note (Signed)
Regularly followed by vascular surgery Dr. Wyn Quaker, last seen in May.  Continue yearly follow-up.

## 2023-08-14 NOTE — Assessment & Plan Note (Signed)
Continues indefinite PPI along with carafate 1gm BID

## 2023-08-14 NOTE — Assessment & Plan Note (Signed)
Continue 1000 units replacement daily

## 2023-08-14 NOTE — Assessment & Plan Note (Signed)
Update renal function.  

## 2023-08-14 NOTE — Assessment & Plan Note (Signed)
Complicated by epidural abscess s/p stimulator removal 05/2022

## 2023-08-14 NOTE — Assessment & Plan Note (Signed)
Chronic, stable on BB and lasix.

## 2023-08-17 ENCOUNTER — Emergency Department: Payer: Medicare Other

## 2023-08-17 ENCOUNTER — Inpatient Hospital Stay
Admission: EM | Admit: 2023-08-17 | Discharge: 2023-08-20 | DRG: 689 | Disposition: A | Discharge status: Home Health | Payer: Medicare Other | Source: Skilled Nursing Facility | Attending: Osteopathic Medicine | Admitting: Osteopathic Medicine

## 2023-08-17 ENCOUNTER — Encounter: Payer: Self-pay | Admitting: *Deleted

## 2023-08-17 ENCOUNTER — Other Ambulatory Visit: Payer: Self-pay

## 2023-08-17 DIAGNOSIS — N401 Enlarged prostate with lower urinary tract symptoms: Secondary | ICD-10-CM | POA: Diagnosis present

## 2023-08-17 DIAGNOSIS — Z8673 Personal history of transient ischemic attack (TIA), and cerebral infarction without residual deficits: Secondary | ICD-10-CM

## 2023-08-17 DIAGNOSIS — Z8679 Personal history of other diseases of the circulatory system: Secondary | ICD-10-CM

## 2023-08-17 DIAGNOSIS — Z85828 Personal history of other malignant neoplasm of skin: Secondary | ICD-10-CM

## 2023-08-17 DIAGNOSIS — R410 Disorientation, unspecified: Secondary | ICD-10-CM | POA: Diagnosis not present

## 2023-08-17 DIAGNOSIS — Z66 Do not resuscitate: Secondary | ICD-10-CM | POA: Diagnosis present

## 2023-08-17 DIAGNOSIS — G9341 Metabolic encephalopathy: Secondary | ICD-10-CM | POA: Diagnosis not present

## 2023-08-17 DIAGNOSIS — R5381 Other malaise: Secondary | ICD-10-CM | POA: Diagnosis present

## 2023-08-17 DIAGNOSIS — E039 Hypothyroidism, unspecified: Secondary | ICD-10-CM | POA: Diagnosis present

## 2023-08-17 DIAGNOSIS — I2510 Atherosclerotic heart disease of native coronary artery without angina pectoris: Secondary | ICD-10-CM | POA: Diagnosis present

## 2023-08-17 DIAGNOSIS — M4802 Spinal stenosis, cervical region: Secondary | ICD-10-CM | POA: Diagnosis not present

## 2023-08-17 DIAGNOSIS — I5032 Chronic diastolic (congestive) heart failure: Secondary | ICD-10-CM | POA: Diagnosis present

## 2023-08-17 DIAGNOSIS — W19XXXA Unspecified fall, initial encounter: Secondary | ICD-10-CM | POA: Diagnosis present

## 2023-08-17 DIAGNOSIS — K8020 Calculus of gallbladder without cholecystitis without obstruction: Secondary | ICD-10-CM | POA: Diagnosis not present

## 2023-08-17 DIAGNOSIS — E785 Hyperlipidemia, unspecified: Secondary | ICD-10-CM | POA: Diagnosis present

## 2023-08-17 DIAGNOSIS — M47812 Spondylosis without myelopathy or radiculopathy, cervical region: Secondary | ICD-10-CM | POA: Diagnosis not present

## 2023-08-17 DIAGNOSIS — M4312 Spondylolisthesis, cervical region: Secondary | ICD-10-CM | POA: Diagnosis not present

## 2023-08-17 DIAGNOSIS — N281 Cyst of kidney, acquired: Secondary | ICD-10-CM | POA: Diagnosis not present

## 2023-08-17 DIAGNOSIS — K219 Gastro-esophageal reflux disease without esophagitis: Secondary | ICD-10-CM | POA: Diagnosis present

## 2023-08-17 DIAGNOSIS — I130 Hypertensive heart and chronic kidney disease with heart failure and stage 1 through stage 4 chronic kidney disease, or unspecified chronic kidney disease: Secondary | ICD-10-CM | POA: Diagnosis present

## 2023-08-17 DIAGNOSIS — I10 Essential (primary) hypertension: Secondary | ICD-10-CM | POA: Diagnosis not present

## 2023-08-17 DIAGNOSIS — G4733 Obstructive sleep apnea (adult) (pediatric): Secondary | ICD-10-CM

## 2023-08-17 DIAGNOSIS — N1831 Chronic kidney disease, stage 3a: Secondary | ICD-10-CM | POA: Diagnosis present

## 2023-08-17 DIAGNOSIS — R1110 Vomiting, unspecified: Secondary | ICD-10-CM | POA: Diagnosis not present

## 2023-08-17 DIAGNOSIS — G9340 Encephalopathy, unspecified: Secondary | ICD-10-CM | POA: Diagnosis not present

## 2023-08-17 DIAGNOSIS — Z888 Allergy status to other drugs, medicaments and biological substances status: Secondary | ICD-10-CM

## 2023-08-17 DIAGNOSIS — R531 Weakness: Secondary | ICD-10-CM | POA: Diagnosis not present

## 2023-08-17 DIAGNOSIS — Z791 Long term (current) use of non-steroidal anti-inflammatories (NSAID): Secondary | ICD-10-CM

## 2023-08-17 DIAGNOSIS — Z8711 Personal history of peptic ulcer disease: Secondary | ICD-10-CM

## 2023-08-17 DIAGNOSIS — N183 Chronic kidney disease, stage 3 unspecified: Secondary | ICD-10-CM | POA: Diagnosis present

## 2023-08-17 DIAGNOSIS — Z7982 Long term (current) use of aspirin: Secondary | ICD-10-CM

## 2023-08-17 DIAGNOSIS — Z79899 Other long term (current) drug therapy: Secondary | ICD-10-CM

## 2023-08-17 DIAGNOSIS — F5104 Psychophysiologic insomnia: Secondary | ICD-10-CM | POA: Diagnosis present

## 2023-08-17 DIAGNOSIS — K5909 Other constipation: Secondary | ICD-10-CM | POA: Diagnosis present

## 2023-08-17 DIAGNOSIS — Y92099 Unspecified place in other non-institutional residence as the place of occurrence of the external cause: Secondary | ICD-10-CM

## 2023-08-17 DIAGNOSIS — G319 Degenerative disease of nervous system, unspecified: Secondary | ICD-10-CM | POA: Diagnosis not present

## 2023-08-17 DIAGNOSIS — G894 Chronic pain syndrome: Secondary | ICD-10-CM | POA: Diagnosis present

## 2023-08-17 DIAGNOSIS — I6782 Cerebral ischemia: Secondary | ICD-10-CM | POA: Diagnosis not present

## 2023-08-17 DIAGNOSIS — M545 Low back pain, unspecified: Secondary | ICD-10-CM | POA: Diagnosis present

## 2023-08-17 DIAGNOSIS — Z823 Family history of stroke: Secondary | ICD-10-CM

## 2023-08-17 DIAGNOSIS — F0390 Unspecified dementia without behavioral disturbance: Secondary | ICD-10-CM | POA: Diagnosis present

## 2023-08-17 DIAGNOSIS — I4892 Unspecified atrial flutter: Secondary | ICD-10-CM | POA: Diagnosis not present

## 2023-08-17 DIAGNOSIS — Z8661 Personal history of infections of the central nervous system: Secondary | ICD-10-CM

## 2023-08-17 DIAGNOSIS — N390 Urinary tract infection, site not specified: Secondary | ICD-10-CM | POA: Diagnosis present

## 2023-08-17 DIAGNOSIS — Z79890 Hormone replacement therapy (postmenopausal): Secondary | ICD-10-CM

## 2023-08-17 DIAGNOSIS — Z6839 Body mass index (BMI) 39.0-39.9, adult: Secondary | ICD-10-CM

## 2023-08-17 DIAGNOSIS — R932 Abnormal findings on diagnostic imaging of liver and biliary tract: Secondary | ICD-10-CM | POA: Diagnosis not present

## 2023-08-17 DIAGNOSIS — Z841 Family history of disorders of kidney and ureter: Secondary | ICD-10-CM

## 2023-08-17 DIAGNOSIS — I5030 Unspecified diastolic (congestive) heart failure: Secondary | ICD-10-CM | POA: Diagnosis present

## 2023-08-17 DIAGNOSIS — Z96653 Presence of artificial knee joint, bilateral: Secondary | ICD-10-CM | POA: Diagnosis present

## 2023-08-17 DIAGNOSIS — M549 Dorsalgia, unspecified: Secondary | ICD-10-CM | POA: Diagnosis not present

## 2023-08-17 DIAGNOSIS — J449 Chronic obstructive pulmonary disease, unspecified: Secondary | ICD-10-CM | POA: Diagnosis present

## 2023-08-17 DIAGNOSIS — Z8619 Personal history of other infectious and parasitic diseases: Secondary | ICD-10-CM

## 2023-08-17 DIAGNOSIS — I6523 Occlusion and stenosis of bilateral carotid arteries: Secondary | ICD-10-CM | POA: Diagnosis not present

## 2023-08-17 DIAGNOSIS — S199XXA Unspecified injury of neck, initial encounter: Secondary | ICD-10-CM | POA: Diagnosis not present

## 2023-08-17 DIAGNOSIS — Z1152 Encounter for screening for COVID-19: Secondary | ICD-10-CM | POA: Diagnosis not present

## 2023-08-17 DIAGNOSIS — I714 Abdominal aortic aneurysm, without rupture, unspecified: Secondary | ICD-10-CM | POA: Diagnosis not present

## 2023-08-17 DIAGNOSIS — B353 Tinea pedis: Secondary | ICD-10-CM

## 2023-08-17 DIAGNOSIS — N138 Other obstructive and reflux uropathy: Secondary | ICD-10-CM | POA: Diagnosis present

## 2023-08-17 DIAGNOSIS — Z955 Presence of coronary angioplasty implant and graft: Secondary | ICD-10-CM

## 2023-08-17 DIAGNOSIS — R4182 Altered mental status, unspecified: Secondary | ICD-10-CM | POA: Diagnosis not present

## 2023-08-17 DIAGNOSIS — R404 Transient alteration of awareness: Secondary | ICD-10-CM | POA: Diagnosis not present

## 2023-08-17 DIAGNOSIS — Z8701 Personal history of pneumonia (recurrent): Secondary | ICD-10-CM

## 2023-08-17 DIAGNOSIS — S0990XA Unspecified injury of head, initial encounter: Secondary | ICD-10-CM | POA: Diagnosis not present

## 2023-08-17 LAB — CBC WITH DIFFERENTIAL/PLATELET
Abs Immature Granulocytes: 0.11 10*3/uL — ABNORMAL HIGH (ref 0.00–0.07)
Basophils Absolute: 0.2 10*3/uL — ABNORMAL HIGH (ref 0.0–0.1)
Basophils Relative: 1 %
Eosinophils Absolute: 0.2 10*3/uL (ref 0.0–0.5)
Eosinophils Relative: 2 %
HCT: 43.4 % (ref 39.0–52.0)
Hemoglobin: 14.6 g/dL (ref 13.0–17.0)
Immature Granulocytes: 1 %
Lymphocytes Relative: 22 %
Lymphs Abs: 2.8 10*3/uL (ref 0.7–4.0)
MCH: 30.7 pg (ref 26.0–34.0)
MCHC: 33.6 g/dL (ref 30.0–36.0)
MCV: 91.4 fL (ref 80.0–100.0)
Monocytes Absolute: 1 10*3/uL (ref 0.1–1.0)
Monocytes Relative: 8 %
Neutro Abs: 8.3 10*3/uL — ABNORMAL HIGH (ref 1.7–7.7)
Neutrophils Relative %: 66 %
Platelets: 264 10*3/uL (ref 150–400)
RBC: 4.75 MIL/uL (ref 4.22–5.81)
RDW: 14.2 % (ref 11.5–15.5)
WBC: 12.5 10*3/uL — ABNORMAL HIGH (ref 4.0–10.5)
nRBC: 0 % (ref 0.0–0.2)

## 2023-08-17 LAB — TROPONIN I (HIGH SENSITIVITY)
Troponin I (High Sensitivity): 44 ng/L — ABNORMAL HIGH (ref <18)
Troponin I (High Sensitivity): 50 ng/L — ABNORMAL HIGH (ref <18)

## 2023-08-17 LAB — URINALYSIS, ROUTINE W REFLEX MICROSCOPIC
Bilirubin Urine: NEGATIVE
Glucose, UA: NEGATIVE mg/dL
Hgb urine dipstick: NEGATIVE
Ketones, ur: NEGATIVE mg/dL
Nitrite: NEGATIVE
Protein, ur: 100 mg/dL — AB
Specific Gravity, Urine: 1.021 (ref 1.005–1.030)
pH: 7 (ref 5.0–8.0)

## 2023-08-17 LAB — RESP PANEL BY RT-PCR (RSV, FLU A&B, COVID)  RVPGX2
Influenza A by PCR: NEGATIVE
Influenza B by PCR: NEGATIVE
Resp Syncytial Virus by PCR: NEGATIVE
SARS Coronavirus 2 by RT PCR: NEGATIVE

## 2023-08-17 LAB — COMPREHENSIVE METABOLIC PANEL
ALT: 17 U/L (ref 0–44)
AST: 25 U/L (ref 15–41)
Albumin: 4.3 g/dL (ref 3.5–5.0)
Alkaline Phosphatase: 84 U/L (ref 38–126)
Anion gap: 10 (ref 5–15)
BUN: 24 mg/dL — ABNORMAL HIGH (ref 8–23)
CO2: 28 mmol/L (ref 22–32)
Calcium: 10.3 mg/dL (ref 8.9–10.3)
Chloride: 97 mmol/L — ABNORMAL LOW (ref 98–111)
Creatinine, Ser: 1.34 mg/dL — ABNORMAL HIGH (ref 0.61–1.24)
GFR, Estimated: 52 mL/min — ABNORMAL LOW (ref >60)
Glucose, Bld: 145 mg/dL — ABNORMAL HIGH (ref 70–99)
Potassium: 4 mmol/L (ref 3.5–5.1)
Sodium: 135 mmol/L (ref 135–145)
Total Bilirubin: 1.1 mg/dL (ref <1.2)
Total Protein: 8.1 g/dL (ref 6.5–8.1)

## 2023-08-17 LAB — TSH: TSH: 4.692 u[IU]/mL — ABNORMAL HIGH (ref 0.350–4.500)

## 2023-08-17 LAB — CK: Total CK: 181 U/L (ref 49–397)

## 2023-08-17 MED ORDER — ASPIRIN 81 MG PO TBEC
81.0000 mg | DELAYED_RELEASE_TABLET | Freq: Every day | ORAL | Status: DC
  Administered 2023-08-17 – 2023-08-20 (×4): 81 mg via ORAL
  Filled 2023-08-17 (×4): qty 1

## 2023-08-17 MED ORDER — OXYCODONE HCL 5 MG PO TABS: 10.0000 mg | ORAL_TABLET | Freq: Two times a day (BID) | ORAL | Status: DC | PRN

## 2023-08-17 MED ORDER — POLYETHYLENE GLYCOL 3350 17 G PO PACK
17.0000 g | PACK | Freq: Every day | ORAL | Status: DC
  Administered 2023-08-18 – 2023-08-20 (×3): 17 g via ORAL
  Filled 2023-08-17 (×3): qty 1

## 2023-08-17 MED ORDER — TIZANIDINE HCL 2 MG PO TABS: 2.0000 mg | ORAL_TABLET | Freq: Two times a day (BID) | ORAL | Status: DC | PRN

## 2023-08-17 MED ORDER — PANTOPRAZOLE SODIUM 40 MG PO TBEC
40.0000 mg | DELAYED_RELEASE_TABLET | Freq: Every day | ORAL | Status: DC
  Administered 2023-08-17 – 2023-08-20 (×4): 40 mg via ORAL
  Filled 2023-08-17 (×4): qty 1

## 2023-08-17 MED ORDER — GABAPENTIN 300 MG PO CAPS
300.0000 mg | ORAL_CAPSULE | Freq: Every day | ORAL | Status: DC
  Administered 2023-08-17: 300 mg via ORAL
  Filled 2023-08-17: qty 1

## 2023-08-17 MED ORDER — SENNA 8.6 MG PO TABS
1.0000 | ORAL_TABLET | Freq: Every day | ORAL | Status: DC
  Administered 2023-08-17 – 2023-08-19 (×3): 8.6 mg via ORAL
  Filled 2023-08-17 (×3): qty 1

## 2023-08-17 MED ORDER — SUCRALFATE 1 G PO TABS
1.0000 g | ORAL_TABLET | Freq: Two times a day (BID) | ORAL | Status: DC
  Administered 2023-08-17 – 2023-08-20 (×6): 1 g via ORAL
  Filled 2023-08-17 (×6): qty 1

## 2023-08-17 MED ORDER — DOCUSATE SODIUM 100 MG PO CAPS
100.0000 mg | ORAL_CAPSULE | Freq: Every day | ORAL | Status: DC
Start: 2023-08-18 — End: 2023-08-20
  Administered 2023-08-18 – 2023-08-20 (×3): 100 mg via ORAL
  Filled 2023-08-17 (×3): qty 1

## 2023-08-17 MED ORDER — ONDANSETRON HCL 4 MG PO TABS
4.0000 mg | ORAL_TABLET | Freq: Four times a day (QID) | ORAL | Status: DC | PRN
  Administered 2023-08-19: 4 mg via ORAL
  Filled 2023-08-17: qty 1

## 2023-08-17 MED ORDER — VITAMIN B-12 1000 MCG PO TABS
1000.0000 ug | ORAL_TABLET | Freq: Every day | ORAL | Status: DC
  Administered 2023-08-18 – 2023-08-20 (×3): 1000 ug via ORAL
  Filled 2023-08-17 (×3): qty 1

## 2023-08-17 MED ORDER — METOPROLOL SUCCINATE ER 50 MG PO TB24
50.0000 mg | ORAL_TABLET | Freq: Every day | ORAL | Status: DC
  Administered 2023-08-17 – 2023-08-20 (×4): 50 mg via ORAL
  Filled 2023-08-17 (×4): qty 1

## 2023-08-17 MED ORDER — EZETIMIBE 10 MG PO TABS
10.0000 mg | ORAL_TABLET | Freq: Every day | ORAL | Status: DC
  Administered 2023-08-17 – 2023-08-20 (×4): 10 mg via ORAL
  Filled 2023-08-17 (×4): qty 1

## 2023-08-17 MED ORDER — LINACLOTIDE 290 MCG PO CAPS
290.0000 ug | ORAL_CAPSULE | Freq: Every day | ORAL | Status: DC
  Filled 2023-08-17: qty 1

## 2023-08-17 MED ORDER — ENOXAPARIN SODIUM 80 MG/0.8ML IJ SOSY
0.5000 mg/kg | PREFILLED_SYRINGE | INTRAMUSCULAR | Status: DC
  Administered 2023-08-17 – 2023-08-19 (×3): 62.5 mg via SUBCUTANEOUS
  Filled 2023-08-17 (×5): qty 0.63

## 2023-08-17 MED ORDER — HYDROCORTISONE 1 % EX CREA
1.0000 | TOPICAL_CREAM | Freq: Three times a day (TID) | CUTANEOUS | Status: DC
  Administered 2023-08-17 – 2023-08-20 (×9): 1 via TOPICAL
  Filled 2023-08-17 (×2): qty 28

## 2023-08-17 MED ORDER — LEVOTHYROXINE SODIUM 175 MCG PO TABS
175.0000 ug | ORAL_TABLET | Freq: Every day | ORAL | Status: DC
  Administered 2023-08-18 – 2023-08-20 (×3): 175 ug via ORAL
  Filled 2023-08-17 (×3): qty 1

## 2023-08-17 MED ORDER — MELATONIN 5 MG PO TABS
10.0000 mg | ORAL_TABLET | Freq: Every day | ORAL | Status: DC
  Administered 2023-08-17 – 2023-08-19 (×3): 10 mg via ORAL
  Filled 2023-08-17 (×3): qty 2

## 2023-08-17 MED ORDER — TAMSULOSIN HCL 0.4 MG PO CAPS
0.4000 mg | ORAL_CAPSULE | Freq: Every day | ORAL | Status: DC
  Administered 2023-08-17 – 2023-08-20 (×4): 0.4 mg via ORAL
  Filled 2023-08-17 (×4): qty 1

## 2023-08-17 MED ORDER — SODIUM CHLORIDE 0.9 % IV SOLN
2.0000 g | INTRAVENOUS | Status: DC
  Administered 2023-08-17 – 2023-08-19 (×3): 2 g via INTRAVENOUS
  Filled 2023-08-17 (×4): qty 20

## 2023-08-17 MED ORDER — ICOSAPENT ETHYL 1 G PO CAPS
2.0000 g | ORAL_CAPSULE | Freq: Two times a day (BID) | ORAL | Status: DC
  Administered 2023-08-17 – 2023-08-20 (×6): 2 g via ORAL
  Filled 2023-08-17 (×8): qty 2

## 2023-08-17 MED ORDER — HYDRALAZINE HCL 20 MG/ML IJ SOLN
5.0000 mg | Freq: Four times a day (QID) | INTRAMUSCULAR | Status: DC | PRN
Start: 2023-08-17 — End: 2023-08-18
  Administered 2023-08-17 – 2023-08-18 (×2): 5 mg via INTRAVENOUS
  Filled 2023-08-17 (×2): qty 1

## 2023-08-17 MED ORDER — DULOXETINE HCL 20 MG PO CPEP
20.0000 mg | ORAL_CAPSULE | Freq: Every day | ORAL | Status: DC
  Administered 2023-08-18: 20 mg via ORAL
  Filled 2023-08-17: qty 1

## 2023-08-17 MED ORDER — CICLOPIROX 8 % EX SOLN: Freq: Every day | CUTANEOUS | Status: DC

## 2023-08-17 MED ORDER — ACETAMINOPHEN 500 MG PO TABS: 500.0000 mg | ORAL_TABLET | Freq: Two times a day (BID) | ORAL | Status: DC | PRN

## 2023-08-17 MED ORDER — FUROSEMIDE 40 MG PO TABS
40.0000 mg | ORAL_TABLET | Freq: Every day | ORAL | Status: DC
  Administered 2023-08-18 – 2023-08-20 (×3): 40 mg via ORAL
  Filled 2023-08-17 (×3): qty 1

## 2023-08-17 MED ORDER — FINASTERIDE 5 MG PO TABS
5.0000 mg | ORAL_TABLET | Freq: Every day | ORAL | Status: DC
  Administered 2023-08-18 – 2023-08-20 (×3): 5 mg via ORAL
  Filled 2023-08-17 (×3): qty 1

## 2023-08-17 NOTE — Progress Notes (Signed)
UA showed pyuria WBC 21-50, compatible with UTI. Will start ceftriaxone

## 2023-08-17 NOTE — Evaluation (Signed)
Physical Therapy Evaluation Patient Details Name: SAMEE CASPAR MRN: [id number] DOB: Nov 21, 1935 Today's Date: 08/17/2023  History of Present Illness  Pt is an 87 y.o. male with medical history significant of a flutter status post ablation, chronic back pain status post spinal stimulator, HTN, HLD, hypothyroidism, BPH, OSA on CPAP at bedtime, CHF, CKD, was found on the floor confused by facility staff this morning and sent to ED for evaluation.   Clinical Impression  Patient alert, only oriented to self, somewhat flat affect. Unable to provide any PLOF. He did exhibit and increased difficulty moving LLE compared to RLE. He needed maxA to complete all mobility. Supine <> sit with maxA and encouragement. Sit <> stand three times with maxA and handheld assist. Several bouts performed with attempted side stepping to reposition on gurney, very challenged with weight shift and LLE movement. Returned to supine with needs in reach.  Overall the patient demonstrated deficits (see "PT Problem List") that impede the patient's functional abilities, safety, and mobility and would benefit from skilled PT intervention.          If plan is discharge home, recommend the following: Two people to help with walking and/or transfers;Two people to help with bathing/dressing/bathroom;Direct supervision/assist for medications management;Help with stairs or ramp for entrance;Assist for transportation;Assistance with feeding;Assistance with cooking/housework;Supervision due to cognitive status   Can travel by private vehicle   No    Equipment Recommendations Other (comment) (TBD)  Recommendations for Other Services       Functional Status Assessment Patient has had a recent decline in their functional status and demonstrates the ability to make significant improvements in function in a reasonable and predictable amount of time.     Precautions / Restrictions Precautions Precautions: Fall Restrictions Weight  Bearing Restrictions Per Provider Order: No      Mobility  Bed Mobility Overal bed mobility: Needs Assistance Bed Mobility: Supine to Sit, Sit to Supine     Supine to sit: Max assist, HOB elevated Sit to supine: Max assist        Transfers Overall transfer level: Needs assistance Equipment used: 1 person hand held assist                    Ambulation/Gait Ambulation/Gait assistance: Max assist             General Gait Details: 2-3 steps at EOB and pt needed maxA/use of bed support behind him to complete this  Stairs            Wheelchair Mobility     Tilt Bed    Modified Rankin (Stroke Patients Only)       Balance Overall balance assessment: Needs assistance Sitting-balance support: Feet supported Sitting balance-Leahy Scale: Fair     Standing balance support: Single extremity supported Standing balance-Leahy Scale: Poor                               Pertinent Vitals/Pain Pain Assessment Pain Assessment: Faces Faces Pain Scale: No hurt    Home Living                     Additional Comments: PT unable to provide PLOF    Prior Function Prior Level of Function : Patient poor historian/Family not available                     Extremity/Trunk Assessment   Upper Extremity Assessment  Upper Extremity Assessment: Defer to OT evaluation;Generalized weakness    Lower Extremity Assessment Lower Extremity Assessment: Generalized weakness (pt more challenged with LLE movement than RLE)       Communication      Cognition Arousal: Alert Behavior During Therapy: Flat affect                                   General Comments: pt oriented to self and birthday        General Comments      Exercises     Assessment/Plan    PT Assessment Patient needs continued PT services  PT Problem List Decreased strength;Decreased range of motion;Decreased activity tolerance;Decreased  balance;Decreased mobility;Decreased knowledge of precautions;Decreased safety awareness;Decreased knowledge of use of DME       PT Treatment Interventions DME instruction;Balance training;Gait training;Neuromuscular re-education;Stair training;Functional mobility training;Patient/family education;Therapeutic activities;Therapeutic exercise    PT Goals (Current goals can be found in the Care Plan section)  Acute Rehab PT Goals PT Goal Formulation: Patient unable to participate in goal setting Time For Goal Achievement: 08/31/23 Potential to Achieve Goals: Fair    Frequency Min 1X/week     Co-evaluation               AM-PAC PT "6 Clicks" Mobility  Outcome Measure Help needed turning from your back to your side while in a flat bed without using bedrails?: A Lot Help needed moving from lying on your back to sitting on the side of a flat bed without using bedrails?: A Lot Help needed moving to and from a bed to a chair (including a wheelchair)?: A Lot Help needed standing up from a chair using your arms (e.g., wheelchair or bedside chair)?: Total Help needed to walk in hospital room?: Total Help needed climbing 3-5 steps with a railing? : Total 6 Click Score: 9    End of Session   Activity Tolerance: Patient tolerated treatment well Patient left: in bed;with call bell/phone within reach;with bed alarm set Nurse Communication: Mobility status PT Visit Diagnosis: Other abnormalities of gait and mobility (R26.89);Difficulty in walking, not elsewhere classified (R26.2);Muscle weakness (generalized) (M62.81)    Time: 4332-9518 PT Time Calculation (min) (ACUTE ONLY): 12 min   Charges:   PT Evaluation $PT Eval Low Complexity: 1 Low   PT General Charges $$ ACUTE PT VISIT: 1 Visit         Olga Coaster PT, DPT 3:12 PM,08/17/23

## 2023-08-17 NOTE — Evaluation (Signed)
Occupational Therapy Evaluation Patient Details Name: Anthony [name] MRN: [id number] DOB: 29-Jun-1936 Today's Date: 08/17/2023   History of Present Illness Pt is an 87 y.o. male with medical history significant of a flutter status post ablation, chronic back pain status post spinal stimulator, HTN, HLD, hypothyroidism, BPH, OSA on CPAP at bedtime, CHF, CKD, was found on the floor confused by facility staff this morning and sent to ED for evaluation.   Clinical Impression   Patient received for OT evaluation. See flowsheet below for details of function. Generally, patient requiring MOD A for bed mobility, unable to safely perform functional mobility at this time, and MIN-MAX A overall for ADLs. Patient will benefit from continued OT while in acute care.       If plan is discharge home, recommend the following: Two people to help with walking and/or transfers;A lot of help with bathing/dressing/bathroom;Assistance with cooking/housework;Direct supervision/assist for medications management;Direct supervision/assist for financial management;Assist for transportation;Help with stairs or ramp for entrance;Supervision due to cognitive status    Functional Status Assessment  Patient has had a recent decline in their functional status and demonstrates the ability to make significant improvements in function in a reasonable and predictable amount of time.  Equipment Recommendations  Other (comment) (defer to next venue of care)    Recommendations for Other Services       Precautions / Restrictions Precautions Precautions: Fall Restrictions Weight Bearing Restrictions Per Provider Order: No      Mobility Bed Mobility Overal bed mobility: Needs Assistance Bed Mobility: Supine to Sit, Sit to Supine     Supine to sit: Mod assist, HOB elevated Sit to supine: Mod assist   General bed mobility comments: pt appearing to have difficulty moving LLE off EOB; OT assisted.    Transfers Overall  transfer level: Needs assistance                 General transfer comment: Pt did half-stand for scooting back in the bed; not safe to stand without RW or other suport at this time.      Balance Overall balance assessment: Needs assistance Sitting-balance support: Feet supported Sitting balance-Leahy Scale: Fair                                     ADL either performed or assessed with clinical judgement   ADL Overall ADL's : Needs assistance/impaired     Grooming: Oral care;Set up;Bed level Grooming Details (indicate cue type and reason): pt engages appropriately with toothbrush/toothpaste; some FMC deficits noted, but pt able to get toothpaste cap off; unable to squeeze toothpaste into mouth like he was trying to; OT assisted to put toothpaste on toothbrush. Pt able to use basin appropriately to rinse and spit.                     Toileting- Clothing Manipulation and Hygiene: Moderate assistance Toileting - Clothing Manipulation Details (indicate cue type and reason): OT assisted pt to place penis in urinal while seated on EOB     Functional mobility during ADLs:  (unable today; did a half stand to scoot back in the bed) General ADL Comments: Poor balance and cognition limiting function.     Vision Baseline Vision/History:  (unknown)       Perception         Praxis         Pertinent Vitals/Pain Pain Assessment Pain  Assessment: No/denies pain     Extremity/Trunk Assessment Upper Extremity Assessment Upper Extremity Assessment: Overall WFL for tasks assessed (appears to be R hand dominant, but will use L hand to engage in tasks when needed; no apparent deficits strength, but does appear to have BIL tremors causing difficulty with [facility] activities.)   Lower Extremity Assessment Lower Extremity Assessment: Generalized weakness;Defer to PT evaluation       Communication Communication Communication: Difficulty following  commands/understanding;Difficulty communicating thoughts/reduced clarity of speech Following commands: Follows one step commands inconsistently Cueing Techniques: Verbal cues;Tactile cues;Gestural cues;Visual cues   Cognition Arousal: Alert Behavior During Therapy:  (confused but friendly) Overall Cognitive Status: No family/caregiver present to determine baseline cognitive functioning                                 General Comments: Oriented to self only. Unknown date, location, situation. When asked where he's from, he states "[place]". When asked where we are right now, pt states he isn't sure; when asked if we are in [place], pt states "no, we are in [facility]." Difficulty following instructions at times during session, and sometimes not seeming to understand OT's questions.     General Comments  Pt on room air; no apparent distress.    Exercises     Shoulder Instructions      Home Living Family/patient expects to be discharged to:: Assisted living                             Home Equipment:  (unknown)   Additional Comments: patient unable to provide information about PLOF      Prior Functioning/Environment Prior Level of Function : Patient poor historian/Family not available             Mobility Comments: Unknown level of mobility ADLs Comments: Unknown level of ADL performance prior        OT Problem List: Decreased activity tolerance;Impaired balance (sitting and/or standing);Decreased cognition;Decreased safety awareness;Decreased knowledge of use of DME or AE;Decreased strength      OT Treatment/Interventions: Self-care/ADL training;Therapeutic exercise;Therapeutic activities;Patient/family education    OT Goals(Current goals can be found in the care plan section) Acute Rehab OT Goals Patient Stated Goal: none stated OT Goal Formulation: Patient unable to participate in goal setting Time For Goal Achievement: 08/31/23 Potential to  Achieve Goals: Fair ADL Goals Pt Will Perform Grooming: with set-up;with supervision;standing Pt Will Perform Lower Body Dressing: with min assist;sit to/from stand Pt Will Transfer to Toilet: with min assist;bedside commode Pt Will Perform Toileting - Clothing Manipulation and hygiene: with modified independence;sit to/from stand  OT Frequency: Min 1X/week    Co-evaluation              AM-PAC OT "6 Clicks" Daily Activity     Outcome Measure Help from another person eating meals?: A Little Help from another person taking care of personal grooming?: A Little Help from another person toileting, which includes using toliet, bedpan, or urinal?: Total Help from another person bathing (including washing, rinsing, drying)?: A Lot Help from another person to put on and taking off regular upper body clothing?: A Lot Help from another person to put on and taking off regular lower body clothing?: Total 6 Click Score: 12   End of Session Equipment Utilized During Treatment: Other (comment) (urinal) Nurse Communication: Mobility status  Activity Tolerance: Patient tolerated treatment well;Other (  comment) (limited by poor cognition) Patient left: in bed;with call bell/phone within reach;with chair alarm set  OT Visit Diagnosis: Unsteadiness on feet (R26.81);Muscle weakness (generalized) (M62.81);History of falling (Z91.81);Other symptoms and signs involving cognitive function                Time: 1330-1351 OT Time Calculation (min): 21 min Charges:  OT General Charges $OT Visit: 1 Visit OT Evaluation $OT Eval Moderate Complexity: 1 Mod OT Treatments $Self Care/Home Management : 8-22 mins  Linward Foster, MS, OTR/L  Alvester Morin 08/17/2023, 4:20 PM

## 2023-08-17 NOTE — H&P (Signed)
History and Physical    Anthony [name]:[id number] DOB: Jan 05, 1936 DOA: 08/17/2023  PCP: Anthony Boyden, MD (Confirm with patient/family/NH records and if not entered, this has to be entered at [facility] point of entry) Patient coming from: Assisted living  I have personally briefly reviewed patient's old medical records in [facility]  Chief Complaint: Patient is confused  HPI: Anthony [name] is a 87 y.o. male with medical history significant of a flutter status post ablation, chronic back pain status post spinal stimulator, HTN, HLD, hypothyroidism, BPH, OSA on CPAP at bedtime, was found on the floor confused by facility staff this morning and sent to ED for evaluation.  Patient is confused and provided only limited information, he does admitted that for the last few days has been having urinary frequency and urgency which he attributed to possible " urinary infection has come back".  He denies any fever or chills no cough no abdominal pain no diarrhea.  Son over the phone reports that the patient might have mild dementia with poor short-term memory and has been undergoing screening test by PCP with a scheduled outpatient MRI next month.  No recent medication changes as per family. ED Course: Afebrile, blood pressure 160s/100, O2 saturation 94% on room air.  CT head and neck negative for fracture or acute dislocations.  Blood work showed WBC 12.5, hemoglobin 14.6, creatinine 1.3, BUN 24, glucose 145.  Troponin 44.  EKG, sinus, no acute ST changes.  UA is pending.  Review of Systems: Unable to perform, patient is confused.  Past Medical History:  Diagnosis Date   Abscess in epidural space of lumbar spine 06/05/2022   Anosmia 1980s   Atrial flutter (HCC) 2018   had an ablation with dr. Tollie Pizza   Basal cell carcinoma 01/11/2022   L cheek - excised 04/25/22   CAD (coronary artery disease)    Cancer (HCC)    skin cancer   Chronic constipation    Chronic insomnia    Chronic lower back  pain    s/p spine stimulator placement   Coag negative Staphylococcus bacteremia    Complication of anesthesia    when under general, he woke up agitated and unable to be held down   Diastolic CHF (HCC)    Discitis of lumbosacral region    History of diverticulitis 2017   History of pneumonia 2013   Hyperlipidemia    Hypertension    Hypothyroidism    Irregular heart rhythm 07/29/2022   Obesity, Class II, BMI 35-39.9, with comorbidity    OSA on CPAP    Peptic ulcer disease 07/29/2022   PONV (postoperative nausea and vomiting)    Vertebral osteomyelitis (HCC)     Past Surgical History:  Procedure Laterality Date   A-FLUTTER ABLATION N/A 05/07/2017   Procedure: A-Flutter Ablation;  Surgeon: Duke Salvia, MD;  Location: [facility] INVASIVE CV LAB;  Service: Cardiovascular;  Laterality: N/A;   CARDIOVASCULAR STRESS TEST  11/2017   no ischemia, low risk study   COLONOSCOPY  2007   normal per prior PCP records, rpt 10 yrs (Dr Rosealee Albee)   ELBOW SURGERY Right    ulnar nerve decompression.  PT DOES NOT RECALL THIS PROCEDURE   ENDOVASCULAR REPAIR/STENT GRAFT N/A 05/05/2020   Procedure: ENDOVASCULAR REPAIR/STENT GRAFT;  Surgeon: Annice Needy, MD;  Location: [facility] INVASIVE CV LAB;  Service: Cardiovascular;  Laterality: N/A;   ESOPHAGOGASTRODUODENOSCOPY (EGD) WITH PROPOFOL N/A 05/30/2022   Procedure: ESOPHAGOGASTRODUODENOSCOPY (EGD) WITH PROPOFOL;  Surgeon: Wyline Mood, MD;  Location: [facility] ENDOSCOPY;  Service: Gastroenterology;  Laterality: N/A;   LAMINECTOMY THORACIC SPINE W/ PLACEMENT SPINAL CORD STIMULATOR  08/2018   and removal of prior spine stimulator (Dr Adriana Simas)   LUMBAR LAMINECTOMY FOR EPIDURAL ABSCESS Bilateral 06/05/2022   Procedure: LUMBAR 2- L3, L5-S1 LAMINECTOMY FOR EPIDURAL ABSCESS;  Surgeon: Venetia Night, MD;  Location: [facility] ORS;  Service: Neurosurgery;  Laterality: Bilateral;   NASAL SINUS SURGERY     nasal polyps. done a long time ago   PERCUTANEOUS CORONARY STENT INTERVENTION  (PCI-S)  2013   EF55%, 70% mid LAD, 99% mid RCA, mild MR, elev LVEDP, DES to mid LAD. RCA is nondominant   RADIOFREQUENCY ABLATION  06/2017   lumbar region [facility])    REPLACEMENT TOTAL KNEE BILATERAL Bilateral 2000s   SPINAL CORD STIMULATOR IMPLANT  06/2016   SPINAL CORD STIMULATOR INSERTION N/A 09/09/2018   Procedure: LUMBAR SPINAL CORD STIMULATOR LEAD AND BATTERY REMOVAL AND LAMINECTOMY FOR PLACEMENT OF PADDLE;  Surgeon: Lucy Chris, MD;  Location: [facility] ORS;  Service: Neurosurgery;  Laterality: N/A;   SPINAL CORD STIMULATOR INSERTION N/A 09/16/2018   Procedure: PLACEMENT OF SPINAL CORD STIMULATOR BATTERY;  Surgeon: Lucy Chris, MD;  Location: [facility] ORS;  Service: Neurosurgery;  Laterality: N/A;   SPINAL CORD STIMULATOR REMOVAL N/A 06/03/2022   Procedure: LUMBAR SPINAL CORD STIMULATOR REMOVAL;  Surgeon: Venetia Night, MD;  Location: [facility] ORS;  Service: Neurosurgery;  Laterality: N/A;     reports that he has never smoked. He has never been exposed to tobacco smoke. He has never used smokeless tobacco. He reports that he does not drink alcohol and does not use drugs.  Allergies  Allergen Reactions   Questran [Cholestyramine]     Patient not aware of an allergy to this medicine.   Atorvastatin     Muscle pain   Gemfibrozil Other (See Comments)    Muscle pain.   Metformin And Related     Dizziness   Rosuvastatin     Muscle pain    Family History  Problem Relation Age of Onset   Stroke Mother    Kidney disease Father    Leukemia Brother    Stroke Sister    Diabetes Neg Hx      Prior to Admission medications   Medication Sig Start Date End Date Taking? Authorizing Provider  acetaminophen (TYLENOL) 500 MG tablet Take 1 tablet (500 mg total) by mouth 2 (two) times daily as needed for moderate pain. 11/06/19   Anthony Boyden, MD  Alirocumab (PRALUENT) 75 MG/ML SOAJ ADMINISTER 75MG  UNDER THE SKIN EVERY 14 DAYS 07/19/23   Duke Salvia, MD  aspirin EC 81 MG tablet Take 1  tablet (81 mg total) by mouth daily. 11/05/18   Anthony Boyden, MD  ciclopirox (PENLAC) 8 % solution APPLY TOPICALLY AT BEDTIME APPLY OVER NAIL AND SURROUNDING SKIN, APPLY DAILY OVER PREVIOUS COAT, AFTER 7 DAYS MAY REMOVE WITH ALCOHOL AND REPEAT 02/06/22   Deirdre Evener, MD  Cyanocobalamin (B-12) 1000 MCG SUBL Place 1 tablet under the tongue daily. 09/02/22   Anthony Boyden, MD  diphenhydramine-acetaminophen (TYLENOL PM) 25-500 MG TABS tablet Take 1 tablet by mouth at bedtime. 08/10/23   Anthony Boyden, MD  docusate sodium (COLACE) 100 MG capsule Take 100 mg by mouth daily.    [provider]  DULoxetine (CYMBALTA) 20 MG capsule Take 1 capsule (20 mg total) by mouth daily. 07/26/22   Anthony Boyden, MD  ezetimibe (ZETIA) 10 MG tablet Take 1 tablet (10 mg total) by  mouth daily. 12/28/21   Anthony Boyden, MD  finasteride (PROSCAR) 5 MG tablet TAKE 1 TABLET(5 MG) BY MOUTH DAILY 08/10/23   Anthony Boyden, MD  fluocinolone (SYNALAR) 0.025 % cream Apply topically 2 (two) times daily as needed.    [provider]  furosemide (LASIX) 40 MG tablet Take 1 tablet (40 mg total) by mouth daily. 08/10/23   Anthony Boyden, MD  gabapentin (NEURONTIN) 300 MG capsule TAKE 2 CAPSULE BY MOUTH AT BEDTIME WITH EXTRA CAPSULE DURING THE DAY AS NEEDED 08/10/23   Anthony Boyden, MD  hydrocortisone 2.5 % cream APPLY TO FACE AT BEDTIME TUES, THURS, SAT 10/04/22   Deirdre Evener, MD  ketoconazole (NIZORAL) 2 % cream APPLY TO FACE AT BEDTIME MON, WED, FRI 10/04/22   Deirdre Evener, MD  ketoconazole (NIZORAL) 2 % shampoo APPLY TOPICALLY 3 TIMES WEEKLY AS DIRECTED 02/06/22   Deirdre Evener, MD  levothyroxine (SYNTHROID) 175 MCG tablet Take 1 tablet (175 mcg total) by mouth daily before breakfast. 05/02/22   Anthony Boyden, MD  lidocaine 4 % Place 1 patch onto the skin daily. OTC    [provider]  LINZESS 290 MCG CAPS capsule TAKE 1 CAPSULE(290 MCG) BY MOUTH DAILY BEFORE  BREAKFAST 04/18/22   Anthony Boyden, MD  Melatonin 10 MG CAPS Take 10 mg by mouth at bedtime.    [provider]  meloxicam (MOBIC) 7.5 MG tablet Take 7.5 mg by mouth daily. 08/29/22   [provider]  metoprolol succinate (TOPROL-XL) 50 MG 24 hr tablet Take 1 tablet (50 mg total) by mouth daily. 08/10/23   Anthony Boyden, MD  Multiple Vitamins-Minerals (CENTRUM SILVER PO) Take 1 tablet by mouth daily.    [provider]  Multiple Vitamins-Minerals (PRESERVISION AREDS 2 PO) Take 1 tablet by mouth daily.    [provider]  nitroGLYCERIN (NITROSTAT) 0.4 MG SL tablet Place 1 tablet (0.4 mg total) under the tongue every 5 (five) minutes as needed for chest pain. 12/01/17   Anthony Boyden, MD  ondansetron (ZOFRAN) 4 MG tablet Take 4 mg by mouth every 6 (six) hours as needed for nausea.    [provider]  Oxycodone HCl 10 MG TABS Take 1 tablet (10 mg total) by mouth 2 (two) times daily as needed (moderate pain). 02/07/23   Anthony Boyden, MD  pantoprazole (PROTONIX) 40 MG tablet Take 1 tablet (40 mg total) by mouth daily. 08/10/23   Anthony Boyden, MD  polyethylene glycol (MIRALAX / GLYCOLAX) 17 g packet Take 17 g by mouth daily.    [provider]  senna (SENOKOT) 8.6 MG tablet Take 1 tablet (8.6 mg total) by mouth daily. 04/06/21   Anthony Boyden, MD  sucralfate (CARAFATE) 1 g tablet Take 1 g by mouth 2 (two) times daily.    [provider]  tamsulosin (FLOMAX) 0.4 MG CAPS capsule Take 1 capsule (0.4 mg total) by mouth daily. 08/10/23   Anthony Boyden, MD  tizanidine (ZANAFLEX) 2 MG capsule Take 2 mg by mouth 2 (two) times daily as needed for muscle spasms.    [provider]  VASCEPA 1 g capsule Take 2 capsules (2 g total) by mouth 2 (two) times daily. 10/17/22   Duke Salvia, MD  Vitamin D, Cholecalciferol, 25 MCG (1000 UT) TABS Take 1 tablet by mouth daily.    [provider]    Physical  Exam: Vitals:   08/17/23 0725 08/17/23 0900  BP: (!) 165/119 (!) 156/114  Pulse: 89 80  Resp: (!) 22 14  Temp: 98.3 F (36.8 C)   TempSrc: Oral   SpO2: 93% 94%    Constitutional: NAD, calm, comfortable Vitals:   08/17/23 0725 08/17/23 0900  BP: (!) 165/119 (!) 156/114  Pulse: 89 80  Resp: (!) 22 14  Temp: 98.3 F (36.8 C)   TempSrc: Oral   SpO2: 93% 94%   Eyes: PERRL, lids and conjunctivae normal ENMT: Mucous membranes are moist. Posterior pharynx clear of any exudate or lesions.Normal dentition.  Neck: normal, supple, no masses, no thyromegaly Respiratory: clear to auscultation bilaterally, no wheezing, no crackles. Normal respiratory effort. No accessory muscle use.  Cardiovascular: Regular rate and rhythm, no murmurs / rubs / gallops. No extremity edema. 2+ pedal pulses. No carotid bruits.  Abdomen: no tenderness, no masses palpated. No hepatosplenomegaly. Bowel sounds positive.  Musculoskeletal: no clubbing / cyanosis. No joint deformity upper and lower extremities. Good ROM, no contractures. Normal muscle tone.  Skin: no rashes, lesions, ulcers. No induration Neurologic: CN 2-12 grossly intact. Sensation intact, DTR normal. Strength 5/5 in all 4.  Psychiatric: Awake, oriented to person, confused about time and place    Labs on Admission: I have personally reviewed following labs and imaging studies  CBC: Recent Labs  Lab 08/10/23 1153 08/17/23 0737  WBC 8.7 12.5*  NEUTROABS 3.6 8.3*  HGB 14.5 14.6  HCT 43.5 43.4  MCV 95.0 91.4  PLT 256.0 264   Basic Metabolic Panel: Recent Labs  Lab 08/10/23 1153 08/17/23 0737  NA 138 135  K 4.3 4.0  CL 101 97*  CO2 32 28  GLUCOSE 122* 145*  BUN 23 24*  CREATININE 1.24 1.34*  CALCIUM 10.2 10.3   GFR: Estimated Creatinine Clearance: 54.3 mL/min (A) (by C-G formula based on SCr of 1.34 mg/dL (H)). Liver Function Tests: Recent Labs  Lab 08/10/23 1153 08/17/23 0737  AST 22 25  ALT 14 17  ALKPHOS 79 84   BILITOT 0.6 1.1  PROT 7.4 8.1  ALBUMIN 4.1 4.3   No results for input(s): "LIPASE", "AMYLASE" in the last 168 hours. No results for input(s): "AMMONIA" in the last 168 hours. Coagulation Profile: No results for input(s): "INR", "PROTIME" in the last 168 hours. Cardiac Enzymes: No results for input(s): "CKTOTAL", "CKMB", "CKMBINDEX", "TROPONINI" in the last 168 hours. BNP (last 3 results) No results for input(s): "PROBNP" in the last 8760 hours. HbA1C: No results for input(s): "HGBA1C" in the last 72 hours. CBG: No results for input(s): "GLUCAP" in the last 168 hours. Lipid Profile: No results for input(s): "CHOL", "HDL", "LDLCALC", "TRIG", "CHOLHDL", "LDLDIRECT" in the last 72 hours. Thyroid Function Tests: Recent Labs    08/17/23 0737  TSH 4.692*   Anemia Panel: No results for input(s): "VITAMINB12", "FOLATE", "FERRITIN", "TIBC", "IRON", "RETICCTPCT" in the last 72 hours. Urine analysis:    Component Value Date/Time   COLORURINE AMBER (A) 05/27/2022 1118   APPEARANCEUR CLOUDY (A) 05/27/2022 1118   LABSPEC 1.021 05/27/2022 1118   PHURINE 5.0 05/27/2022 1118   GLUCOSEU 50 (A) 05/27/2022 1118   HGBUR LARGE (A) 05/27/2022 1118   BILIRUBINUR negative 07/25/2023 1702   KETONESUR NEGATIVE 05/27/2022 1118   PROTEINUR Positive (A) 07/25/2023 1702   PROTEINUR 100 (A) 05/27/2022 1118   UROBILINOGEN 0.2 07/25/2023 1702   NITRITE negative 07/25/2023 1702   NITRITE NEGATIVE 05/27/2022 1118   LEUKOCYTESUR Negative 07/25/2023 1702   LEUKOCYTESUR SMALL (A) 05/27/2022 1118    Radiological Exams on Admission: DG Chest 2 View Result Date: 08/17/2023 CLINICAL  DATA:  Altered level of consciousness, fall, weakness EXAM: CHEST - 2 VIEW COMPARISON:  05/27/2022 FINDINGS: Frontal and lateral views of the chest demonstrate an unremarkable cardiac silhouette. No acute airspace disease, effusion, or pneumothorax. No acute bony abnormalities. IMPRESSION: 1. No acute intrathoracic process.  Electronically Signed   By: Sharlet Salina M.D.   On: 08/17/2023 08:31   CT Head Wo Contrast Result Date: 08/17/2023 CLINICAL DATA:  Head trauma. Altered mental status. Status post fall. EXAM: CT HEAD WITHOUT CONTRAST TECHNIQUE: Contiguous axial images were obtained from the base of the skull through the vertex without intravenous contrast. RADIATION DOSE REDUCTION: This exam was performed according to the departmental dose-optimization program which includes automated exposure control, adjustment of the mA and/or kV according to patient size and/or use of iterative reconstruction technique. COMPARISON:  10/30/2021 FINDINGS: Brain: No evidence of acute infarction, hemorrhage, hydrocephalus, extra-axial collection or mass lesion/mass effect. There is mild diffuse low-attenuation within the subcortical and periventricular white matter compatible with chronic microvascular disease. Prominence of the sulci and ventricles. Vascular: No hyperdense vessel or unexpected calcification. Skull: Normal. Negative for fracture or focal lesion. Sinuses/Orbits: Previous bilateral median antrectomy. Partial opacification of the sphenoid sinus. Air-fluid scratch set air-fluid level is noted within the sphenoid sinus. Partial opacification of the frontal sinuses and ethmoid air cells. Mastoid air cells are clear. Other: No scalp hematoma identified IMPRESSION: 1. No acute intracranial abnormalities. 2. Chronic microvascular disease and cerebral atrophy. 3. Chronic sinus inflammation. Electronically Signed   By: Signa Kell M.D.   On: 08/17/2023 08:31   CT Cervical Spine Wo Contrast Result Date: 08/17/2023 CLINICAL DATA:  Neck trauma.  Altered mental status after falling. EXAM: CT CERVICAL SPINE WITHOUT CONTRAST TECHNIQUE: Multidetector CT imaging of the cervical spine was performed without intravenous contrast. Multiplanar CT image reconstructions were also generated. RADIATION DOSE REDUCTION: This exam was performed  according to the departmental dose-optimization program which includes automated exposure control, adjustment of the mA and/or kV according to patient size and/or use of iterative reconstruction technique. COMPARISON:  CT thoracic spine 08/19/2018 FINDINGS: Alignment: Reversal of the usual cervical lordosis with a slight degenerative anterolisthesis at C2-3, C3-4 and C5-6. Skull base and vertebrae: No evidence of acute cervical spine fracture or traumatic subluxation. Multilevel spondylosis with disc space narrowing, uncinate spurring and facet hypertrophy. Soft tissues and spinal canal: No prevertebral fluid or swelling. No visible canal hematoma. Disc levels: Multilevel spondylosis with disc space narrowing, uncinate spurring and facet hypertrophy. There is resulting mild-to-moderate foraminal narrowing at multiple levels, greatest on the left at C4-5 and bilaterally at C5-6. There is also mild central spinal stenosis at multiple levels, but no large disc herniation identified. Upper chest: Clear lung apices. Other: Bilateral carotid atherosclerosis. No acute soft tissue abnormalities are identified. IMPRESSION: 1. No evidence of acute cervical spine fracture, traumatic subluxation or static signs of instability. 2. Multilevel cervical spondylosis as described. Electronically Signed   By: Carey Bullocks M.D.   On: 08/17/2023 08:29    EKG: Independently reviewed.  Sinus rhythm, no acute ST changes.  Assessment/Plan Principal Problem:   AMS (altered mental status) Active Problems:   BPH with urinary obstruction   Acute metabolic encephalopathy   UTI (urinary tract infection)  (please populate well all problems here in Problem List. (For example, if patient is on BP meds at home and you resume or decide to hold them, it is a problem that needs to be her. Same for CAD, COPD, HLD and so on)  Acute metabolic encephalopathy -Clinically suspect UTI.  UA is pending, depends on the result of UA we will  initiate antibiotics. -Brain image reassuring.  Plan to treat underlying causes, if no significant improvement within 24 hours, will consider further brain image study. -Other DDx, review history and discussed with patient's son, that patient had history of epidural abscess last year along with bacteremia, after a lumbar spine steroid injection.  Family reported no such injection since last year after the epidural abscess.  And patient does not complain any back pain at this point.  Low suspicion for recurrent back infection. -Other DDx, will check TSH  Leukocytosis -Probably secondary to UTI, currently there is no signs of sepsis  Elevated troponins -Patient denied any chest pains, EKG showed no ischemic ST changes -Trend troponins -Check CK level  CKD stage II -Euvolemic, resume Lasix tomorrow  HTN -Stable, continue metoprolol  GERD -Continue PPI and Carafate  Chronic back pain -Stable, continue as needed narcotics -Continue gabapentin at bedtime  Fall and deconditioning -PT evaluation  DVT prophylaxis: Lovenox Code Status: DNR Family Communication: Son over the phone Disposition Plan: Expect less than 2 midnight hospital stay Consults called: None Admission status: Telemetry observation   Emeline General MD [facility] Pager [phone]  08/17/2023, 11:13 AM

## 2023-08-17 NOTE — Progress Notes (Signed)
Pt arrived to the unit via stretcher. No family member present.  Pt was alert and oriented to self only. VSS, no distress noted. Pt 's skin assesses by this RN and charge nurse, Bre, no pressure injury, redness to perineum area and sacrum. Bed at lowest position, call bell and table within reach. Bed alarm on.

## 2023-08-17 NOTE — ED Triage Notes (Signed)
Pt arrives via EMS from [facility] for AMS and a fall. Normally ANO x4. On arrival is ANO x1. Per EMS, LKW is last night but do not know the time. Hx of HTN and not on any blood thinners   EMS vitals: 182/116 BP 157 CBG

## 2023-08-17 NOTE — ED Provider Notes (Signed)
[facility] Provider Note    Event Date/Time   First MD Initiated Contact with Patient 08/17/23 [phone]     (approximate)   History   Chief Complaint Altered Mental Status and Fall   HPI  Anthony [name] is a 87 y.o. male with past medical history of hypertension, CAD, CHF, CKD, and chronic pain syndrome who presents to the ED for altered mental status.  Per EMS, patient is alert and oriented x 4 at baseline, found down on the ground by staff at his nursing facility this morning following a fall.  Patient does not recall the fall and staff reported to EMS that he was more confused than usual this morning.  On arrival to the ED, patient states that he does not feel well, but denies any specific complaints.  He denies any fevers, cough, chest pain, shortness of breath, nausea, vomiting, diarrhea, or dysuria.     Physical Exam   Triage Vital Signs: ED Triage Vitals  Encounter Vitals Group     BP      Systolic BP Percentile      Diastolic BP Percentile      Pulse      Resp      Temp      Temp src      SpO2      Weight      Height      Head Circumference      Peak Flow      Pain Score      Pain Loc      Pain Education      Exclude from Growth Chart     Most recent vital signs: Vitals:   08/17/23 0725  BP: (!) 165/119  Pulse: 89  Resp: (!) 22  Temp: 98.3 F (36.8 C)  SpO2: 93%    Constitutional: Alert and oriented to person, but not place, time, or situation. Eyes: Conjunctivae are normal. Head: Atraumatic. Nose: No congestion/rhinnorhea. Mouth/Throat: Mucous membranes are moist.  Neck: No midline cervical spine tenderness to palpation. Cardiovascular: Normal rate, regular rhythm. Grossly normal heart sounds.  2+ radial pulses bilaterally. Respiratory: Normal respiratory effort.  No retractions. Lungs CTAB.  No chest wall tenderness to palpation. Gastrointestinal: Soft and nontender. No distention. Musculoskeletal: No lower extremity  tenderness nor edema.  No upper extremity bony tenderness to palpation. Neurologic:  Normal speech and language. No gross focal neurologic deficits are appreciated.    ED Results / Procedures / Treatments   Labs (all labs ordered are listed, but only abnormal results are displayed) Labs Reviewed  CBC WITH DIFFERENTIAL/PLATELET - Abnormal; Notable for the following components:      Result Value   WBC 12.5 (*)    Neutro Abs 8.3 (*)    Basophils Absolute 0.2 (*)    Abs Immature Granulocytes 0.11 (*)    All other components within normal limits  COMPREHENSIVE METABOLIC PANEL - Abnormal; Notable for the following components:   Chloride 97 (*)    Glucose, Bld 145 (*)    BUN 24 (*)    Creatinine, Ser 1.34 (*)    GFR, Estimated 52 (*)    All other components within normal limits  TROPONIN I (HIGH SENSITIVITY) - Abnormal; Notable for the following components:   Troponin I (High Sensitivity) 44 (*)    All other components within normal limits  RESP PANEL BY RT-PCR (RSV, FLU A&B, COVID)  [id number]  URINALYSIS, ROUTINE W REFLEX MICROSCOPIC  TSH  TROPONIN  I (HIGH SENSITIVITY)     EKG  ED ECG REPORT I, Chesley Noon, the attending physician, personally viewed and interpreted this ECG.   Date: 08/17/2023  EKG Time: 7:32  Rate: 88  Rhythm: normal sinus rhythm  Axis: Normal  Intervals:none  ST&T Change: None  RADIOLOGY CT head reviewed and interpreted by me with no hemorrhage or midline shift.  PROCEDURES:  Critical Care performed: No  Procedures   MEDICATIONS ORDERED IN ED: Medications - No data to display   IMPRESSION / MDM / ASSESSMENT AND PLAN / ED COURSE  I reviewed the triage vital signs and the nursing notes.                              87 y.o. male with past medical history of hypertension, CAD, CHF, CKD, and chronic pain syndrome who presents to the ED complaining of unwitnessed fall at his nursing facility, subsequently found to be more confused than  usual.  Patient's presentation is most consistent with acute presentation with potential threat to life or bodily function.  Differential diagnosis includes, but is not limited to, stroke, TIA, anemia, electrolyte abnormality, AKI, UTI, pneumonia.  Patient nontoxic-appearing and in no acute distress, vital signs are unremarkable.  Patient states that he does not feel well but denies any specific complaints, does appear to be confused compared to his reported baseline.  No focal neurologic deficits noted but given his unwitnessed fall, we will check CT head and cervical spine.  No evidence of traumatic injury to his trunk or extremities.  Lab results are pending at this time.  CT head and cervical spine are negative for acute process, chest x-ray also unremarkable.  Labs show mild leukocytosis and AKI, no acute anemia or electrolyte abnormality noted. UA pending.  Patient continues to be altered on reassessment, case discussed with hospitalist for admission.      FINAL CLINICAL IMPRESSION(S) / ED DIAGNOSES   Final diagnoses:  Altered mental status, unspecified altered mental status type  Fall, initial encounter     Rx / DC Orders   ED Discharge Orders     None        Note:  This document was prepared using Dragon voice recognition software and may include unintentional dictation errors.   Chesley Noon, MD 08/17/23 1031

## 2023-08-17 NOTE — Progress Notes (Signed)
Not be able to complete admission questions d/t pt's confusion.

## 2023-08-18 ENCOUNTER — Inpatient Hospital Stay: Payer: Medicare Other

## 2023-08-18 ENCOUNTER — Observation Stay: Payer: Medicare Other

## 2023-08-18 DIAGNOSIS — Z66 Do not resuscitate: Secondary | ICD-10-CM | POA: Diagnosis present

## 2023-08-18 DIAGNOSIS — E785 Hyperlipidemia, unspecified: Secondary | ICD-10-CM | POA: Diagnosis present

## 2023-08-18 DIAGNOSIS — Y92099 Unspecified place in other non-institutional residence as the place of occurrence of the external cause: Secondary | ICD-10-CM | POA: Diagnosis not present

## 2023-08-18 DIAGNOSIS — G319 Degenerative disease of nervous system, unspecified: Secondary | ICD-10-CM | POA: Diagnosis not present

## 2023-08-18 DIAGNOSIS — W19XXXA Unspecified fall, initial encounter: Secondary | ICD-10-CM | POA: Diagnosis present

## 2023-08-18 DIAGNOSIS — R5381 Other malaise: Secondary | ICD-10-CM | POA: Diagnosis present

## 2023-08-18 DIAGNOSIS — K219 Gastro-esophageal reflux disease without esophagitis: Secondary | ICD-10-CM | POA: Diagnosis present

## 2023-08-18 DIAGNOSIS — R4182 Altered mental status, unspecified: Secondary | ICD-10-CM

## 2023-08-18 DIAGNOSIS — N1831 Chronic kidney disease, stage 3a: Secondary | ICD-10-CM | POA: Diagnosis present

## 2023-08-18 DIAGNOSIS — Z96653 Presence of artificial knee joint, bilateral: Secondary | ICD-10-CM | POA: Diagnosis present

## 2023-08-18 DIAGNOSIS — Z1152 Encounter for screening for COVID-19: Secondary | ICD-10-CM | POA: Diagnosis not present

## 2023-08-18 DIAGNOSIS — G9341 Metabolic encephalopathy: Secondary | ICD-10-CM | POA: Diagnosis present

## 2023-08-18 DIAGNOSIS — G4733 Obstructive sleep apnea (adult) (pediatric): Secondary | ICD-10-CM | POA: Diagnosis present

## 2023-08-18 DIAGNOSIS — N401 Enlarged prostate with lower urinary tract symptoms: Secondary | ICD-10-CM | POA: Diagnosis present

## 2023-08-18 DIAGNOSIS — N390 Urinary tract infection, site not specified: Secondary | ICD-10-CM | POA: Diagnosis present

## 2023-08-18 DIAGNOSIS — M4802 Spinal stenosis, cervical region: Secondary | ICD-10-CM | POA: Diagnosis not present

## 2023-08-18 DIAGNOSIS — Z85828 Personal history of other malignant neoplasm of skin: Secondary | ICD-10-CM | POA: Diagnosis not present

## 2023-08-18 DIAGNOSIS — N281 Cyst of kidney, acquired: Secondary | ICD-10-CM | POA: Diagnosis not present

## 2023-08-18 DIAGNOSIS — I6523 Occlusion and stenosis of bilateral carotid arteries: Secondary | ICD-10-CM | POA: Diagnosis not present

## 2023-08-18 DIAGNOSIS — I130 Hypertensive heart and chronic kidney disease with heart failure and stage 1 through stage 4 chronic kidney disease, or unspecified chronic kidney disease: Secondary | ICD-10-CM | POA: Diagnosis present

## 2023-08-18 DIAGNOSIS — J449 Chronic obstructive pulmonary disease, unspecified: Secondary | ICD-10-CM | POA: Diagnosis present

## 2023-08-18 DIAGNOSIS — R932 Abnormal findings on diagnostic imaging of liver and biliary tract: Secondary | ICD-10-CM | POA: Diagnosis not present

## 2023-08-18 DIAGNOSIS — I714 Abdominal aortic aneurysm, without rupture, unspecified: Secondary | ICD-10-CM | POA: Diagnosis not present

## 2023-08-18 DIAGNOSIS — K8020 Calculus of gallbladder without cholecystitis without obstruction: Secondary | ICD-10-CM | POA: Diagnosis not present

## 2023-08-18 DIAGNOSIS — G894 Chronic pain syndrome: Secondary | ICD-10-CM | POA: Diagnosis present

## 2023-08-18 DIAGNOSIS — I6782 Cerebral ischemia: Secondary | ICD-10-CM | POA: Diagnosis not present

## 2023-08-18 DIAGNOSIS — I4892 Unspecified atrial flutter: Secondary | ICD-10-CM | POA: Diagnosis not present

## 2023-08-18 DIAGNOSIS — F0390 Unspecified dementia without behavioral disturbance: Secondary | ICD-10-CM | POA: Insufficient documentation

## 2023-08-18 DIAGNOSIS — M545 Low back pain, unspecified: Secondary | ICD-10-CM | POA: Diagnosis present

## 2023-08-18 DIAGNOSIS — N138 Other obstructive and reflux uropathy: Secondary | ICD-10-CM | POA: Diagnosis present

## 2023-08-18 DIAGNOSIS — Z8673 Personal history of transient ischemic attack (TIA), and cerebral infarction without residual deficits: Secondary | ICD-10-CM | POA: Diagnosis not present

## 2023-08-18 DIAGNOSIS — I2510 Atherosclerotic heart disease of native coronary artery without angina pectoris: Secondary | ICD-10-CM | POA: Diagnosis present

## 2023-08-18 DIAGNOSIS — E039 Hypothyroidism, unspecified: Secondary | ICD-10-CM | POA: Diagnosis present

## 2023-08-18 DIAGNOSIS — G9340 Encephalopathy, unspecified: Secondary | ICD-10-CM | POA: Diagnosis not present

## 2023-08-18 DIAGNOSIS — I5032 Chronic diastolic (congestive) heart failure: Secondary | ICD-10-CM | POA: Diagnosis present

## 2023-08-18 LAB — COMPREHENSIVE METABOLIC PANEL
ALT: 14 U/L (ref 0–44)
AST: 28 U/L (ref 15–41)
Albumin: 3.9 g/dL (ref 3.5–5.0)
Alkaline Phosphatase: 68 U/L (ref 38–126)
Anion gap: 8 (ref 5–15)
BUN: 21 mg/dL (ref 8–23)
CO2: 27 mmol/L (ref 22–32)
Calcium: 10 mg/dL (ref 8.9–10.3)
Chloride: 99 mmol/L (ref 98–111)
Creatinine, Ser: 1.06 mg/dL (ref 0.61–1.24)
GFR, Estimated: >60 mL/min (ref >60)
Glucose, Bld: 144 mg/dL — ABNORMAL HIGH (ref 70–99)
Potassium: 3.6 mmol/L (ref 3.5–5.1)
Sodium: 134 mmol/L — ABNORMAL LOW (ref 135–145)
Total Bilirubin: 1 mg/dL (ref <1.2)
Total Protein: 7.3 g/dL (ref 6.5–8.1)

## 2023-08-18 LAB — CBC
HCT: 41 % (ref 39.0–52.0)
Hemoglobin: 14 g/dL (ref 13.0–17.0)
MCH: 31.3 pg (ref 26.0–34.0)
MCHC: 34.1 g/dL (ref 30.0–36.0)
MCV: 91.5 fL (ref 80.0–100.0)
Platelets: 233 10*3/uL (ref 150–400)
RBC: 4.48 MIL/uL (ref 4.22–5.81)
RDW: 14.4 % (ref 11.5–15.5)
WBC: 11.5 10*3/uL — ABNORMAL HIGH (ref 4.0–10.5)
nRBC: 0 % (ref 0.0–0.2)

## 2023-08-18 LAB — VITAMIN B12: Vitamin B-12: 517 pg/mL (ref 180–914)

## 2023-08-18 LAB — HIV ANTIBODY (ROUTINE TESTING W REFLEX): HIV Screen 4th Generation wRfx: NONREACTIVE

## 2023-08-18 LAB — T4, FREE: Free T4: 0.8 ng/dL (ref 0.61–1.12)

## 2023-08-18 MED ORDER — IOHEXOL 350 MG/ML SOLN
100.0000 mL | Freq: Once | INTRAVENOUS | Status: AC | PRN
  Administered 2023-08-18: 100 mL via INTRAVENOUS

## 2023-08-18 MED ORDER — ONDANSETRON HCL 4 MG/2ML IJ SOLN
4.0000 mg | Freq: Four times a day (QID) | INTRAMUSCULAR | Status: DC | PRN
  Administered 2023-08-18: 4 mg via INTRAVENOUS
  Filled 2023-08-18: qty 2

## 2023-08-18 MED ORDER — LORAZEPAM 2 MG/ML IJ SOLN: 1.0000 mg | Freq: Once | INTRAMUSCULAR | Status: DC | PRN

## 2023-08-18 MED ORDER — HYDRALAZINE HCL 20 MG/ML IJ SOLN
10.0000 mg | Freq: Four times a day (QID) | INTRAMUSCULAR | Status: DC | PRN
  Administered 2023-08-18: 10 mg via INTRAVENOUS
  Filled 2023-08-18: qty 1

## 2023-08-18 MED ORDER — LINACLOTIDE 145 MCG PO CAPS
290.0000 ug | ORAL_CAPSULE | Freq: Every day | ORAL | Status: DC
  Administered 2023-08-18 – 2023-08-20 (×3): 290 ug via ORAL
  Filled 2023-08-18 (×3): qty 2

## 2023-08-18 NOTE — Plan of Care (Signed)

## 2023-08-18 NOTE — Progress Notes (Signed)
Patient received from 1A via bed, accompanied by staff. Patient alert, oriented to self only. Scattered areas of discoloration. Buttocks red/slow to blanch. Tele applied. Room Air. Blood pressure elevated. Call light in reach.   Cornell Barman Neveyah Garzon

## 2023-08-18 NOTE — Progress Notes (Signed)
Patient vomiting, MD aware.  Anthony [name]

## 2023-08-18 NOTE — TOC CM/SW Note (Signed)
CSW attempted call to son Ferne Reus). Spoke to his spouse, [name] to call CSW back shortly.  Alfonso Ramus, LCSW Transitions of Care Department [phone]

## 2023-08-18 NOTE — Plan of Care (Signed)
  Problem: Clinical Measurements: Goal: Diagnostic test results will improve Outcome: Progressing Goal: Respiratory complications will improve Outcome: Progressing Goal: Cardiovascular complication will be avoided Outcome: Progressing   Problem: Activity: Goal: Risk for activity intolerance will decrease Outcome: Progressing   Problem: Nutrition: Goal: Adequate nutrition will be maintained Outcome: Progressing   Problem: Coping: Goal: Level of anxiety will decrease Outcome: Progressing   Problem: Elimination: Goal: Will not experience complications related to bowel motility Outcome: Progressing Goal: Will not experience complications related to urinary retention Outcome: Progressing   Problem: Pain Management: Goal: General experience of comfort will improve Outcome: Progressing   Problem: Safety: Goal: Ability to remain free from injury will improve Outcome: Progressing   Problem: Skin Integrity: Goal: Risk for impaired skin integrity will decrease Outcome: Progressing

## 2023-08-18 NOTE — TOC Initial Note (Signed)
Transition of Care [facility]) - Initial/Assessment Note    Patient Details  Name: Anthony [name] MRN: [id number] Date of Birth: 07/21/1936  Transition of Care [facility]) CM/SW Contact:    Liliana Cline, LCSW Phone Number: 08/18/2023, 3:44 PM  Clinical Narrative:                 CSW spoke with both sons Ferne Reus and [name]) by phone regarding PT recommendations. Patient is from [facility]. Patient uses a upright walker with arm rests at baseline. Also has a wheelchair.  CSW explained PT recs for SNF. Patient's son state patient went to [facility] in the past and did not have a good experience, they strongly prefer patient return to [facility] with [facility] when discharged if possible. They understand this would be up to [facility].  CSW called Misty Stanley with [name]. She states she can come assess patient on Monday to see if he could return. Misty Stanley states she would like Don to bring patient's special walker to the hospital and see if he does better with therapy using that (informed [name] of this and he states he will bring it). Misty Stanley states patient is mostly independent at his baseline at the ALF.    Expected Discharge Plan: Assisted Living Barriers to Discharge: Continued Medical Work up   Patient Goals and CMS Choice Patient states their goals for this hospitalization and ongoing recovery are:: they prefer he return to ALF CMS Medicare.gov Compare Post Acute Care list provided to:: Patient Represenative (must comment) Choice offered to / list presented to : Adult Children, HC POA / Guardian      Expected Discharge Plan and Services       Living arrangements for the past 2 months: Assisted Living Facility                                      Prior Living Arrangements/Services Living arrangements for the past 2 months: Assisted Living Facility Lives with:: Facility Resident Patient language and need for interpreter reviewed:: Yes Do you feel safe going back to the place where you live?: Yes      Need  for Family Participation in Patient Care: Yes (Comment) Care giver support system in place?: Yes (comment) Current home services: DME Criminal Activity/Legal Involvement Pertinent to Current Situation/Hospitalization: No - Comment as needed  Activities of Daily Living      Permission Sought/Granted Permission sought to share information with : Oceanographer granted to share information with : Yes, Verbal Permission Granted     Permission granted to share info w AGENCY: [facility]        Emotional Assessment       Orientation: : Fluctuating Orientation (Suspected and/or reported Sundowners) Alcohol / Substance Use: Not Applicable Psych Involvement: No (comment)  Admission diagnosis:  Fall, initial encounter [W19.XXXA] Altered mental status, unspecified altered mental status type [R41.82] AMS (altered mental status) [R41.82] Patient Active Problem List   Diagnosis Date Noted   Dementia (HCC) 08/18/2023   Acute metabolic encephalopathy 08/17/2023   UTI (urinary tract infection) 08/17/2023   AMS (altered mental status) 08/17/2023   Statin myopathy 08/14/2023   Low serum vitamin B12 01/06/2023   DNR (do not resuscitate) 01/06/2023   Amnestic MCI (mild cognitive impairment with memory loss) 08/31/2022   Peptic ulcer disease 07/29/2022   Failed spinal cord stimulator (HCC) 06/02/2022   Lumbar radiculopathy 05/08/2022   Lumbar spondylosis  01/31/2022   Lumbar degenerative disc disease 01/31/2022   Chronic pain syndrome 01/31/2022   Osteoarthritis of ankle 01/17/2022   Medicare annual wellness visit, subsequent 12/29/2021   Prediabetes 12/29/2021   Fatigue 12/31/2020   S/P endovascular aneurysm repair 04/2020   Eosinophilia 04/11/2020   Varicose veins of leg with pain, bilateral 04/06/2020   General unsteadiness 02/07/2020   Encounter for chronic pain management 07/04/2019   Essential tremor 05/07/2019   Cystic kidney disease 04/15/2019    Osteoarthritis 04/15/2019   Seasonal allergies 04/15/2019   Anosmia 02/17/2019   Advanced care planning/counseling discussion 11/05/2018   Vitamin D deficiency 10/24/2018   CKD (chronic kidney disease) stage 3, GFR 30-59 ml/min (HCC) 10/18/2017   Therapeutic opioid-induced constipation (OIC) 10/03/2017   Chronic insomnia    Essential hypertension    Hypothyroidism    Severe obesity (BMI 35.0-39.9) with comorbidity (HCC)    CAD (coronary artery disease)    Chronic lower back pain    History of atrial flutter 04/15/2017   History of basal cell carcinoma (BCC) 04/12/2017   History of squamous cell carcinoma in situ of skin 04/12/2017   Hypercholesterolemia with hypertriglyceridemia 04/12/2017   Chronic constipation 04/12/2017   OSA on CPAP 04/12/2017   BPH with urinary obstruction 04/12/2017   (HFpEF) heart failure with preserved ejection fraction (HCC) 02/18/2017   Abdominal aortic aneurysm (AAA) without rupture (HCC) 01/09/2016   Calculus of gallbladder without cholecystitis 01/09/2016   Diverticulitis of sigmoid colon 01/09/2016   Spondylolisthesis of lumbar region 01/09/2016   Recurrent major depression in remission (HCC) 03/28/2013   Esophageal reflux 09/21/2011   Spinal stenosis 04/20/2011   PCP:  Eustaquio Boyden, MD Pharmacy:   [facility] - [place], [place] - [phone] [address] [place] [address] Phone: [phone] Fax: [phone]  [id number] - [place], [place] - [address] AT [facility] OF [address] [facility] [address] [place] [address] Phone: [phone] Fax: [phone]     Social Drivers of Health (SDOH) Social History: SDOH Screenings   Food Insecurity: No Food Insecurity (08/18/2023)  Housing: Unknown (08/18/2023)  Transportation Needs: No Transportation Needs (04/19/2023)  Utilities: Patient Unable To Answer (08/18/2023)  Alcohol Screen: Low Risk  (12/15/2020)   Depression (PHQ2-9): Low Risk  (04/19/2023)  Financial Resource Strain: Low Risk  (04/19/2023)  Physical Activity: Insufficiently Active (04/19/2023)  Social Connections: Socially Isolated (04/19/2023)  Stress: No Stress Concern Present (04/19/2023)  Tobacco Use: Low Risk  (08/17/2023)  Health Literacy: Adequate Health Literacy (04/19/2023)   SDOH Interventions:     Readmission Risk Interventions     No data to display

## 2023-08-18 NOTE — Care Management Obs Status (Signed)
MEDICARE OBSERVATION STATUS NOTIFICATION   Patient Details  Name: Anthony [name] MRN: [id number] Date of Birth: 11-27-1935   Medicare Observation Status Notification Given:  Yes  Explained to son [name] by phone. Copy left at bedside for other son [name] per [name]'s request.   Liliana Cline, LCSW 08/18/2023, 3:37 PM

## 2023-08-18 NOTE — Progress Notes (Addendum)
PROGRESS NOTE    Anthony [name]  WGN:[id number] DOB: 1936-08-17 DOA: 08/17/2023 PCP: Eustaquio Boyden, MD     Brief Narrative:   From admission h and p  Anthony [name] is a 87 y.o. male with medical history significant of a flutter status post ablation, chronic back pain status post spinal stimulator, HTN, HLD, hypothyroidism, BPH, OSA on CPAP at bedtime, was found on the floor confused by facility staff this morning and sent to ED for evaluation.   Patient is confused and provided only limited information, he does admitted that for the last few days has been having urinary frequency and urgency which he attributed to possible " urinary infection has come back".  He denies any fever or chills no cough no abdominal pain no diarrhea.  Son over the phone reports that the patient might have mild dementia with poor short-term memory and has been undergoing screening test by PCP with a scheduled outpatient MRI next month.  No recent medication changes as per family.  Assessment & Plan:   Principal Problem:   AMS (altered mental status) Active Problems:   OSA on CPAP   BPH with urinary obstruction   History of atrial flutter   (HFpEF) heart failure with preserved ejection fraction (HCC)   Essential hypertension   Hypothyroidism   Severe obesity (BMI 35.0-39.9) with comorbidity (HCC)   CAD (coronary artery disease)   CKD (chronic kidney disease) stage 3, GFR 30-59 ml/min (HCC)   Chronic pain syndrome   Acute metabolic encephalopathy   UTI (urinary tract infection)   Dementia (HCC)  # Acute encephalopathy # Dementia Etiology of abrupt decline in mental status unclear. No hepatic or renal dysfunction or electrolyte abnormalities. Well nourished, doubt wernicke. Nothing acute seen on CT of head. No report of seizure-like activity. No fever or back pain or spinous process tenderness to suggest recurrence of prior discitis. Possible uti. Delirium also in ddx. Staff at ALF supply his meds so  overdose unlikely - will check mri to r/o stroke - have ordered blood culture but aware antibiotics have already been given - f/u bladder scan - will add on urine culture - consider eeg if remains altered - check b12, t4, vbg, hiv  # Pyuria # BPH Possible UTI. Bladder scan performed this morning, no retention. - continue ceftriaxone - will attempt to add-on urine culture, which wasn't ordered  # CAD Asymptomatic - cont home asa, metop  # Chronic pain - d/c home duloxetine, gabapentin, oxy, xanaflex given encephalopathy  # BPH Able to urinate, no retention on bladder scan this morning - cont home finasteride, flomax  # HFpEF Appears compensated - cont home lasix  # Hypothyroid Tsh mild elevation - cont home synthroid - f/u t4  # HTN Here bp elevated - cont home metop - hydral prn  # Debility - PT/OT consults   DVT prophylaxis: lovenox Code Status: dnr Family Communication: son updated @ bedside 12/21  Level of care: Telemetry Medical Status is: Observation    Consultants:  none  Procedures: none  Antimicrobials:  ceftriaxone    Subjective: Confused, no complaints  Objective: Vitals:   08/17/23 2234 08/17/23 2249 08/18/23 0826 08/18/23 0834  BP: (!) 165/88 (!) 154/84 (!) 165/108 (!) 165/108  Pulse: 81 78 60   Resp:  17 17   Temp:  98.9 F (37.2 C) 97.9 F (36.6 C)   TempSrc:  Oral    SpO2: 95% 94% 97%     Intake/Output Summary (Last 24 hours)  at 08/18/2023 1100 Last data filed at 08/18/2023 0804 Gross per 24 hour  Intake 240 ml  Output --  Net 240 ml   There were no vitals filed for this visit.  Examination:  General exam: Appears calm and comfortable  Respiratory system: Clear to auscultation. Respiratory effort normal. Cardiovascular system: S1 & S2 heard, RRR.   Gastrointestinal system: Abdomen is obese, soft and nontender.   Central nervous system: Awake, confused, moves all 4 but doesn't participate in exam Extremities:  warm, trace LE edema Skin: No rashes, lesions or ulcers Psychiatry: confused    Data Reviewed: I have personally reviewed following labs and imaging studies  CBC: Recent Labs  Lab 08/17/23 0737 08/18/23 0529  WBC 12.5* 11.5*  NEUTROABS 8.3*  --   HGB 14.6 14.0  HCT 43.4 41.0  MCV 91.4 91.5  PLT 264 233   Basic Metabolic Panel: Recent Labs  Lab 08/17/23 0737  NA 135  K 4.0  CL 97*  CO2 28  GLUCOSE 145*  BUN 24*  CREATININE 1.34*  CALCIUM 10.3   GFR: Estimated Creatinine Clearance: 54.3 mL/min (A) (by C-G formula based on SCr of 1.34 mg/dL (H)). Liver Function Tests: Recent Labs  Lab 08/17/23 0737  AST 25  ALT 17  ALKPHOS 84  BILITOT 1.1  PROT 8.1  ALBUMIN 4.3   No results for input(s): "LIPASE", "AMYLASE" in the last 168 hours. No results for input(s): "AMMONIA" in the last 168 hours. Coagulation Profile: No results for input(s): "INR", "PROTIME" in the last 168 hours. Cardiac Enzymes: Recent Labs  Lab 08/17/23 0746  CKTOTAL 181   BNP (last 3 results) No results for input(s): "PROBNP" in the last 8760 hours. HbA1C: No results for input(s): "HGBA1C" in the last 72 hours. CBG: No results for input(s): "GLUCAP" in the last 168 hours. Lipid Profile: No results for input(s): "CHOL", "HDL", "LDLCALC", "TRIG", "CHOLHDL", "LDLDIRECT" in the last 72 hours. Thyroid Function Tests: Recent Labs    08/17/23 0737  TSH 4.692*   Anemia Panel: No results for input(s): "VITAMINB12", "FOLATE", "FERRITIN", "TIBC", "IRON", "RETICCTPCT" in the last 72 hours. Urine analysis:    Component Value Date/Time   COLORURINE YELLOW (A) 08/17/2023 1100   APPEARANCEUR CLEAR (A) 08/17/2023 1100   LABSPEC 1.021 08/17/2023 1100   PHURINE 7.0 08/17/2023 1100   GLUCOSEU NEGATIVE 08/17/2023 1100   HGBUR NEGATIVE 08/17/2023 1100   BILIRUBINUR NEGATIVE 08/17/2023 1100   BILIRUBINUR negative 07/25/2023 1702   KETONESUR NEGATIVE 08/17/2023 1100   PROTEINUR 100 (A) 08/17/2023  1100   UROBILINOGEN 0.2 07/25/2023 1702   NITRITE NEGATIVE 08/17/2023 1100   LEUKOCYTESUR TRACE (A) 08/17/2023 1100   Sepsis Labs: @LABRCNTIP (procalcitonin:4,lacticidven:4)  ) Recent Results (from the past 240 hours)  Resp panel by RT-PCR (RSV, Flu A&B, Covid) Anterior Nasal Swab     Status: None   Collection Time: 08/17/23  7:38 AM   Specimen: Anterior Nasal Swab  Result Value Ref Range Status   SARS Coronavirus 2 by RT PCR NEGATIVE NEGATIVE Final    Comment: (NOTE) SARS-CoV-2 target nucleic acids are NOT DETECTED.  The SARS-CoV-2 RNA is generally detectable in upper respiratory specimens during the acute phase of infection. The lowest concentration of SARS-CoV-2 viral copies this assay can detect is 138 copies/mL. A negative result does not preclude SARS-Cov-2 infection and should not be used as the sole basis for treatment or other patient management decisions. A negative result may occur with  improper specimen collection/handling, submission of specimen other than nasopharyngeal  swab, presence of viral mutation(s) within the areas targeted by this assay, and inadequate number of viral copies(<138 copies/mL). A negative result must be combined with clinical observations, patient history, and epidemiological information. The expected result is Negative.  Fact Sheet for Patients:  BloggerCourse.com  Fact Sheet for Healthcare Providers:  SeriousBroker.it  This test is no t yet approved or cleared by the [place] FDA and  has been authorized for detection and/or diagnosis of SARS-CoV-2 by FDA under an Emergency Use Authorization (EUA). This EUA will remain  in effect (meaning this test can be used) for the duration of the COVID-19 declaration under Section 564(b)(1) of the Act, 21 U.S.C.section 360bbb-3(b)(1), unless the authorization is terminated  or revoked sooner.       Influenza A by PCR NEGATIVE NEGATIVE Final    Influenza B by PCR NEGATIVE NEGATIVE Final    Comment: (NOTE) The Xpert Xpress SARS-CoV-2/FLU/RSV plus assay is intended as an aid in the diagnosis of influenza from Nasopharyngeal swab specimens and should not be used as a sole basis for treatment. Nasal washings and aspirates are unacceptable for Xpert Xpress SARS-CoV-2/FLU/RSV testing.  Fact Sheet for Patients: BloggerCourse.com  Fact Sheet for Healthcare Providers: SeriousBroker.it  This test is not yet approved or cleared by the [place] FDA and has been authorized for detection and/or diagnosis of SARS-CoV-2 by FDA under an Emergency Use Authorization (EUA). This EUA will remain in effect (meaning this test can be used) for the duration of the COVID-19 declaration under Section 564(b)(1) of the Act, 21 U.S.C. section 360bbb-3(b)(1), unless the authorization is terminated or revoked.     Resp Syncytial Virus by PCR NEGATIVE NEGATIVE Final    Comment: (NOTE) Fact Sheet for Patients: BloggerCourse.com  Fact Sheet for Healthcare Providers: SeriousBroker.it  This test is not yet approved or cleared by the [place] FDA and has been authorized for detection and/or diagnosis of SARS-CoV-2 by FDA under an Emergency Use Authorization (EUA). This EUA will remain in effect (meaning this test can be used) for the duration of the COVID-19 declaration under Section 564(b)(1) of the Act, 21 U.S.C. section 360bbb-3(b)(1), unless the authorization is terminated or revoked.  Performed at [facility], [address] [place], [place] [address]          Radiology Studies: DG Chest 2 View Result Date: 08/17/2023 CLINICAL DATA:  Altered level of consciousness, fall, weakness EXAM: CHEST - 2 VIEW COMPARISON:  05/27/2022 FINDINGS: Frontal and lateral views of the chest demonstrate an unremarkable cardiac  silhouette. No acute airspace disease, effusion, or pneumothorax. No acute bony abnormalities. IMPRESSION: 1. No acute intrathoracic process. Electronically Signed   By: Sharlet Salina M.D.   On: 08/17/2023 08:31   CT Head Wo Contrast Result Date: 08/17/2023 CLINICAL DATA:  Head trauma. Altered mental status. Status post fall. EXAM: CT HEAD WITHOUT CONTRAST TECHNIQUE: Contiguous axial images were obtained from the base of the skull through the vertex without intravenous contrast. RADIATION DOSE REDUCTION: This exam was performed according to the departmental dose-optimization program which includes automated exposure control, adjustment of the mA and/or kV according to patient size and/or use of iterative reconstruction technique. COMPARISON:  10/30/2021 FINDINGS: Brain: No evidence of acute infarction, hemorrhage, hydrocephalus, extra-axial collection or mass lesion/mass effect. There is mild diffuse low-attenuation within the subcortical and periventricular white matter compatible with chronic microvascular disease. Prominence of the sulci and ventricles. Vascular: No hyperdense vessel or unexpected calcification. Skull: Normal. Negative for fracture or focal lesion. Sinuses/Orbits:  Previous bilateral median antrectomy. Partial opacification of the sphenoid sinus. Air-fluid scratch set air-fluid level is noted within the sphenoid sinus. Partial opacification of the frontal sinuses and ethmoid air cells. Mastoid air cells are clear. Other: No scalp hematoma identified IMPRESSION: 1. No acute intracranial abnormalities. 2. Chronic microvascular disease and cerebral atrophy. 3. Chronic sinus inflammation. Electronically Signed   By: Signa Kell M.D.   On: 08/17/2023 08:31   CT Cervical Spine Wo Contrast Result Date: 08/17/2023 CLINICAL DATA:  Neck trauma.  Altered mental status after falling. EXAM: CT CERVICAL SPINE WITHOUT CONTRAST TECHNIQUE: Multidetector CT imaging of the cervical spine was performed  without intravenous contrast. Multiplanar CT image reconstructions were also generated. RADIATION DOSE REDUCTION: This exam was performed according to the departmental dose-optimization program which includes automated exposure control, adjustment of the mA and/or kV according to patient size and/or use of iterative reconstruction technique. COMPARISON:  CT thoracic spine 08/19/2018 FINDINGS: Alignment: Reversal of the usual cervical lordosis with a slight degenerative anterolisthesis at C2-3, C3-4 and C5-6. Skull base and vertebrae: No evidence of acute cervical spine fracture or traumatic subluxation. Multilevel spondylosis with disc space narrowing, uncinate spurring and facet hypertrophy. Soft tissues and spinal canal: No prevertebral fluid or swelling. No visible canal hematoma. Disc levels: Multilevel spondylosis with disc space narrowing, uncinate spurring and facet hypertrophy. There is resulting mild-to-moderate foraminal narrowing at multiple levels, greatest on the left at C4-5 and bilaterally at C5-6. There is also mild central spinal stenosis at multiple levels, but no large disc herniation identified. Upper chest: Clear lung apices. Other: Bilateral carotid atherosclerosis. No acute soft tissue abnormalities are identified. IMPRESSION: 1. No evidence of acute cervical spine fracture, traumatic subluxation or static signs of instability. 2. Multilevel cervical spondylosis as described. Electronically Signed   By: Carey Bullocks M.D.   On: 08/17/2023 08:29        Scheduled Meds:  aspirin EC  81 mg Oral Daily   ciclopirox   Topical QHS   cyanocobalamin  1,000 mcg Oral Daily   docusate sodium  100 mg Oral Daily   DULoxetine  20 mg Oral Daily   enoxaparin (LOVENOX) injection  0.5 mg/kg Subcutaneous Q24H   ezetimibe  10 mg Oral Daily   finasteride  5 mg Oral Daily   furosemide  40 mg Oral Daily   gabapentin  300 mg Oral QHS   hydrocortisone cream  1 Application Topical TID   icosapent  Ethyl  2 g Oral BID   levothyroxine  175 mcg Oral QAC breakfast   linaclotide  290 mcg Oral QAC breakfast   melatonin  10 mg Oral QHS   metoprolol succinate  50 mg Oral Daily   pantoprazole  40 mg Oral Daily   polyethylene glycol  17 g Oral Daily   senna  1 tablet Oral Daily   sucralfate  1 g Oral BID   tamsulosin  0.4 mg Oral Daily   Continuous Infusions:  cefTRIAXone (ROCEPHIN)  IV Stopped (08/17/23 1631)     LOS: 0 days     Silvano Bilis, MD [facility]   If 7PM-7AM, please contact night-coverage [email or web address] Password [email or web address] 08/18/2023, 11:00 AM

## 2023-08-19 ENCOUNTER — Inpatient Hospital Stay: Payer: Medicare Other

## 2023-08-19 DIAGNOSIS — R4182 Altered mental status, unspecified: Secondary | ICD-10-CM | POA: Diagnosis not present

## 2023-08-19 LAB — BASIC METABOLIC PANEL
Anion gap: 9 (ref 5–15)
BUN: 25 mg/dL — ABNORMAL HIGH (ref 8–23)
CO2: 27 mmol/L (ref 22–32)
Calcium: 9.9 mg/dL (ref 8.9–10.3)
Chloride: 98 mmol/L (ref 98–111)
Creatinine, Ser: 1.26 mg/dL — ABNORMAL HIGH (ref 0.61–1.24)
GFR, Estimated: 56 mL/min — ABNORMAL LOW (ref >60)
Glucose, Bld: 115 mg/dL — ABNORMAL HIGH (ref 70–99)
Potassium: 3.4 mmol/L — ABNORMAL LOW (ref 3.5–5.1)
Sodium: 134 mmol/L — ABNORMAL LOW (ref 135–145)

## 2023-08-19 LAB — CBC
HCT: 38.8 % — ABNORMAL LOW (ref 39.0–52.0)
Hemoglobin: 13.1 g/dL (ref 13.0–17.0)
MCH: 31.1 pg (ref 26.0–34.0)
MCHC: 33.8 g/dL (ref 30.0–36.0)
MCV: 92.2 fL (ref 80.0–100.0)
Platelets: 234 10*3/uL (ref 150–400)
RBC: 4.21 MIL/uL — ABNORMAL LOW (ref 4.22–5.81)
RDW: 14.6 % (ref 11.5–15.5)
WBC: 9 10*3/uL (ref 4.0–10.5)
nRBC: 0 % (ref 0.0–0.2)

## 2023-08-19 LAB — URINE CULTURE: Culture: NO GROWTH

## 2023-08-19 NOTE — TOC Progression Note (Signed)
Transition of Care [facility]) - Progression Note    Patient Details  Name: Anthony [name] MRN: [id number] Date of Birth: May 12, 1936  [facility] CM/SW Contact  Liliana Cline, LCSW Phone Number: 08/19/2023, 10:16 AM  Clinical Narrative:    PTA is working with patient today and [name] work with him again in the morning.   Expected Discharge Plan: Assisted Living Barriers to Discharge: Continued Medical Work up  Expected Discharge Plan and Services       Living arrangements for the past 2 months: Assisted Living Facility                                       Social Determinants of Health (SDOH) Interventions SDOH Screenings   Food Insecurity: No Food Insecurity (08/18/2023)  Housing: Unknown (08/18/2023)  Transportation Needs: No Transportation Needs (04/19/2023)  Utilities: Patient Unable To Answer (08/18/2023)  Alcohol Screen: Low Risk  (12/15/2020)  Depression (PHQ2-9): Low Risk  (04/19/2023)  Financial Resource Strain: Low Risk  (04/19/2023)  Physical Activity: Insufficiently Active (04/19/2023)  Social Connections: Socially Isolated (04/19/2023)  Stress: No Stress Concern Present (04/19/2023)  Tobacco Use: Low Risk  (08/17/2023)  Health Literacy: Adequate Health Literacy (04/19/2023)    Readmission Risk Interventions     No data to display

## 2023-08-19 NOTE — Progress Notes (Addendum)
PROGRESS NOTE    Anthony [name]  XBJ:[id number] DOB: 25-Jun-1936 DOA: 08/17/2023 PCP: Eustaquio Boyden, MD     Brief Narrative:   From admission h and p  Anthony [name] is a 87 y.o. male with medical history significant of a flutter status post ablation, chronic back pain status post spinal stimulator, HTN, HLD, hypothyroidism, BPH, OSA on CPAP at bedtime, was found on the floor confused by facility staff this morning and sent to ED for evaluation.   Patient is confused and provided only limited information, he does admitted that for the last few days has been having urinary frequency and urgency which he attributed to possible " urinary infection has come back".  He denies any fever or chills no cough no abdominal pain no diarrhea.  Son over the phone reports that the patient might have mild dementia with poor short-term memory and has been undergoing screening test by PCP with a scheduled outpatient MRI next month.  No recent medication changes as per family.  Assessment & Plan:   Principal Problem:   AMS (altered mental status) Active Problems:   OSA on CPAP   BPH with urinary obstruction   History of atrial flutter   (HFpEF) heart failure with preserved ejection fraction (HCC)   Essential hypertension   Hypothyroidism   Severe obesity (BMI 35.0-39.9) with comorbidity (HCC)   CAD (coronary artery disease)   CKD (chronic kidney disease) stage 3, GFR 30-59 ml/min (HCC)   Chronic pain syndrome   Acute metabolic encephalopathy   UTI (urinary tract infection)   Dementia (HCC)  # Acute encephalopathy # Dementia Etiology of abrupt decline in mental status unclear. No hepatic or renal dysfunction or electrolyte abnormalities. Well nourished, doubt wernicke. Nothing acute seen on CT of head or MRI. No report of seizure-like activity. No fever or back pain or spinous process tenderness to suggest recurrence of prior discitis. Possible uti. Delirium also in ddx. Staff at ALF supply his  meds so overdose unlikely. This morning encephalopathy much improved, suggestive of either uti or delirium.   - will check mri to r/o stroke - f/u blood cultures but aware antibiotics have already been given  # Prior CVA Seen on MRI, this is a new finding. Son requests carotid u/s to further the ischemic w/u, which is reasonable to pursue as won't be discharging today. Intolerant to statins. - f/u carotid u/s - continue asa  # Pyuria # Acute cystitis? # BPH Possible UTI. Bladder scan performed, no retention. - continue ceftriaxone - f/u culture  # Vomiting X2 yesterday. LFTs and CT of abdomen/pelvis nothing acute. This morning denies nausea, is hungry - advance diet, monitor  # CAD Asymptomatic - cont home asa, metop  # Chronic pain - holding home duloxetine, gabapentin, oxy, xanaflex given encephalopathy  # BPH Able to urinate, no retention on bladder scan  - cont home finasteride, flomax  # HFpEF Appears compensated - cont home lasix  # Hypothyroid Tsh mild elevation, t4 wnl - cont home synthroid  # HTN Here bp mildly elevated - cont home metop - hydral prn  # Debility PT advises snf but family prefers back to ALF with home health. His ALF is planning on evaluating the patient tomorrow to see if he is well enough to return.   DVT prophylaxis: lovenox Code Status: dnr Family Communication: son updated @ bedside 12/22  Level of care: Telemetry Medical Status is: inpt    Consultants:  none  Procedures: none  Antimicrobials:  ceftriaxone    Subjective: Awake and alert, no complaints  Objective: Vitals:   08/18/23 1548 08/18/23 2014 08/19/23 0447 08/19/23 0759  BP: (!) 139/108 (!) 133/90 109/62 120/75  Pulse:  80 69 70  Resp:  16 20 18   Temp:  98.4 F (36.9 C) 97.8 F (36.6 C) 98.3 F (36.8 C)  TempSrc:  Oral Oral Oral  SpO2:  92% 92% 92%   No intake or output data in the 24 hours ending 08/19/23 0943  There were no vitals filed for  this visit.  Examination:  General exam: Appears calm and comfortable  Respiratory system: Clear to auscultation. Respiratory effort normal. Cardiovascular system: S1 & S2 heard, RRR.   Gastrointestinal system: Abdomen is obese, soft and nontender.   Central nervous system: Awake, alert, moves all 4 but doesn't participate in exam Extremities: warm, trace LE edema Skin: No rashes, lesions or ulcers Psychiatry: calm    Data Reviewed: I have personally reviewed following labs and imaging studies  CBC: Recent Labs  Lab 08/17/23 0737 08/18/23 0529 08/19/23 0836  WBC 12.5* 11.5* 9.0  NEUTROABS 8.3*  --   --   HGB 14.6 14.0 13.1  HCT 43.4 41.0 38.8*  MCV 91.4 91.5 92.2  PLT 264 233 234   Basic Metabolic Panel: Recent Labs  Lab 08/17/23 0737 08/18/23 1932 08/19/23 0836  NA 135 134* 134*  K 4.0 3.6 3.4*  CL 97* 99 98  CO2 28 27 27   GLUCOSE 145* 144* 115*  BUN 24* 21 25*  CREATININE 1.34* 1.06 1.26*  CALCIUM 10.3 10.0 9.9   GFR: Estimated Creatinine Clearance: 57.8 mL/min (A) (by C-G formula based on SCr of 1.26 mg/dL (H)). Liver Function Tests: Recent Labs  Lab 08/17/23 0737 08/18/23 1932  AST 25 28  ALT 17 14  ALKPHOS 84 68  BILITOT 1.1 1.0  PROT 8.1 7.3  ALBUMIN 4.3 3.9   No results for input(s): "LIPASE", "AMYLASE" in the last 168 hours. No results for input(s): "AMMONIA" in the last 168 hours. Coagulation Profile: No results for input(s): "INR", "PROTIME" in the last 168 hours. Cardiac Enzymes: Recent Labs  Lab 08/17/23 0746  CKTOTAL 181   BNP (last 3 results) No results for input(s): "PROBNP" in the last 8760 hours. HbA1C: No results for input(s): "HGBA1C" in the last 72 hours. CBG: No results for input(s): "GLUCAP" in the last 168 hours. Lipid Profile: No results for input(s): "CHOL", "HDL", "LDLCALC", "TRIG", "CHOLHDL", "LDLDIRECT" in the last 72 hours. Thyroid Function Tests: Recent Labs    08/17/23 0737 08/18/23 1059  TSH 4.692*  --    FREET4  --  0.80   Anemia Panel: Recent Labs    08/18/23 1059  VITAMINB12 517   Urine analysis:    Component Value Date/Time   COLORURINE YELLOW (A) 08/17/2023 1100   APPEARANCEUR CLEAR (A) 08/17/2023 1100   LABSPEC 1.021 08/17/2023 1100   PHURINE 7.0 08/17/2023 1100   GLUCOSEU NEGATIVE 08/17/2023 1100   HGBUR NEGATIVE 08/17/2023 1100   BILIRUBINUR NEGATIVE 08/17/2023 1100   BILIRUBINUR negative 07/25/2023 1702   KETONESUR NEGATIVE 08/17/2023 1100   PROTEINUR 100 (A) 08/17/2023 1100   UROBILINOGEN 0.2 07/25/2023 1702   NITRITE NEGATIVE 08/17/2023 1100   LEUKOCYTESUR TRACE (A) 08/17/2023 1100   Sepsis Labs: @LABRCNTIP (procalcitonin:4,lacticidven:4)  ) Recent Results (from the past 240 hours)  Resp panel by RT-PCR (RSV, Flu A&B, Covid) Anterior Nasal Swab     Status: None   Collection Time: 08/17/23  7:38 AM  Specimen: Anterior Nasal Swab  Result Value Ref Range Status   SARS Coronavirus 2 by RT PCR NEGATIVE NEGATIVE Final    Comment: (NOTE) SARS-CoV-2 target nucleic acids are NOT DETECTED.  The SARS-CoV-2 RNA is generally detectable in upper respiratory specimens during the acute phase of infection. The lowest concentration of SARS-CoV-2 viral copies this assay can detect is 138 copies/mL. A negative result does not preclude SARS-Cov-2 infection and should not be used as the sole basis for treatment or other patient management decisions. A negative result may occur with  improper specimen collection/handling, submission of specimen other than nasopharyngeal swab, presence of viral mutation(s) within the areas targeted by this assay, and inadequate number of viral copies(<138 copies/mL). A negative result must be combined with clinical observations, patient history, and epidemiological information. The expected result is Negative.  Fact Sheet for Patients:  BloggerCourse.com  Fact Sheet for Healthcare Providers:   SeriousBroker.it  This test is no t yet approved or cleared by the [place] FDA and  has been authorized for detection and/or diagnosis of SARS-CoV-2 by FDA under an Emergency Use Authorization (EUA). This EUA will remain  in effect (meaning this test can be used) for the duration of the COVID-19 declaration under Section 564(b)(1) of the Act, 21 U.S.C.section 360bbb-3(b)(1), unless the authorization is terminated  or revoked sooner.       Influenza A by PCR NEGATIVE NEGATIVE Final   Influenza B by PCR NEGATIVE NEGATIVE Final    Comment: (NOTE) The Xpert Xpress SARS-CoV-2/FLU/RSV plus assay is intended as an aid in the diagnosis of influenza from Nasopharyngeal swab specimens and should not be used as a sole basis for treatment. Nasal washings and aspirates are unacceptable for Xpert Xpress SARS-CoV-2/FLU/RSV testing.  Fact Sheet for Patients: BloggerCourse.com  Fact Sheet for Healthcare Providers: SeriousBroker.it  This test is not yet approved or cleared by the [place] FDA and has been authorized for detection and/or diagnosis of SARS-CoV-2 by FDA under an Emergency Use Authorization (EUA). This EUA will remain in effect (meaning this test can be used) for the duration of the COVID-19 declaration under Section 564(b)(1) of the Act, 21 U.S.C. section 360bbb-3(b)(1), unless the authorization is terminated or revoked.     Resp Syncytial Virus by PCR NEGATIVE NEGATIVE Final    Comment: (NOTE) Fact Sheet for Patients: BloggerCourse.com  Fact Sheet for Healthcare Providers: SeriousBroker.it  This test is not yet approved or cleared by the [place] FDA and has been authorized for detection and/or diagnosis of SARS-CoV-2 by FDA under an Emergency Use Authorization (EUA). This EUA will remain in effect (meaning this test can be used) for  the duration of the COVID-19 declaration under Section 564(b)(1) of the Act, 21 U.S.C. section 360bbb-3(b)(1), unless the authorization is terminated or revoked.  Performed at [facility], [address] [place], [place] [address]   Culture, blood (Routine X 2) w Reflex to ID Panel     Status: None (Preliminary result)   Collection Time: 08/18/23 10:56 AM   Specimen: BLOOD  Result Value Ref Range Status   Specimen Description BLOOD [facility]  Final   Special Requests   Final    BOTTLES DRAWN AEROBIC AND ANAEROBIC Blood Culture adequate volume   Culture   Final    NO GROWTH < 24 HOURS Performed at [facility], [address] [place], [place] [address]    Report Status PENDING  Incomplete  Culture, blood (Routine X 2) w Reflex to ID Panel  Status: None (Preliminary result)   Collection Time: 08/18/23 10:59 AM   Specimen: BLOOD  Result Value Ref Range Status   Specimen Description BLOOD RAC  Final   Special Requests   Final    BOTTLES DRAWN AEROBIC AND ANAEROBIC Blood Culture adequate volume   Culture   Final    NO GROWTH < 24 HOURS Performed at [facility], [address], [place], [place] [address]    Report Status PENDING  Incomplete         Radiology Studies: CT ABDOMEN PELVIS W CONTRAST Result Date: 08/18/2023 CLINICAL DATA:  Possible bowel obstruction.  Intractable vomiting. EXAM: CT ABDOMEN AND PELVIS WITH CONTRAST TECHNIQUE: Multidetector CT imaging of the abdomen and pelvis was performed using the standard protocol following bolus administration of intravenous contrast. RADIATION DOSE REDUCTION: This exam was performed according to the departmental dose-optimization program which includes automated exposure control, adjustment of the mA and/or kV according to patient size and/or use of iterative reconstruction technique. CONTRAST:  OMNIPAQUE IOHEXOL 350 MG/ML SOLN COMPARISON:  05/22/2022, 10/30/2021 FINDINGS: Lower chest: Mild  cardiomegaly. Moderate calcified plaque over the left anterior descending coronary artery. Thoracic aorta is otherwise normal in caliber. Lung bases are clear. Hepatobiliary: 2 moderate size gallstones are present. 1.2 cm hypodensity over the right lobe of the liver unchanged likely a cyst. Biliary tree is normal. Pancreas: Normal. Spleen: Normal. Adrenals/Urinary Tract: Adrenal glands are normal. Kidneys are normal in size. Multiple bilateral renal cysts with the largest over the lower pole right kidney measuring 3.7 cm with Hounsfield unit measurements less than 10. These are stable. Stable 2.2 cm mass with Hounsfield unit measurements of 37 over the mid pole right kidney likely a hemorrhagic cyst, but indeterminate. No hydronephrosis or nephrolithiasis. Ureters are normal. Bladder is somewhat decompressed with possible focal wall thickening over the right anterior bladder wall. Stomach/Bowel: Stomach and small bowel are normal. Appendix not visualized. Colon is unremarkable. Vascular/Lymphatic: Moderate calcified plaque over the abdominal aorta. Patient is post abdominal aortic aneurysm repair with aortoiliac stent graft present beginning just below the renal arteries. Graft is patent and unchanged. Moderate opacification of the native aneurysm sac. Native aneurysm sac measures approximately 4.8 cm in AP diameter (previously 4.5 cm). No evidence of adenopathy. Reproductive: Normal. Other: No significant free fluid or focal inflammatory change. Musculoskeletal: Moderate degenerative changes of the spine with multilevel disc disease over the lumbar spine. Mild degenerative change of the hips. IMPRESSION: 1. No acute findings in the abdomen/pelvis. No evidence of bowel obstruction. 2. Cholelithiasis. 3. 1.2 cm hypodensity over the right lobe of the liver unchanged likely a cyst. 4. Multiple bilateral renal cysts with the largest over the lower pole right kidney measuring 3.7 cm. Stable 2.2 cm mass over the mid pole  right kidney likely a hemorrhagic cyst, but indeterminate. Recommend follow-up renal ultrasound versus renal protocol CT and 6 months. 5. Possible focal wall thickening over the right anterior bladder wall. Recommend urology protocol CT for further evaluation. 6. Aortic atherosclerosis. Post abdominal aortic aneurysm repair with aortoiliac stent graft present. Native aneurysm sac measures approximately 4.8 cm in AP diameter (previously 4.5 cm). Aortic Atherosclerosis (ICD10-I70.0). Electronically Signed   By: Elberta Fortis M.D.   On: 08/18/2023 21:36   MR BRAIN WO CONTRAST Result Date: 08/18/2023 CLINICAL DATA:  Provided history: Encephalopathy. EXAM: MRI HEAD WITHOUT CONTRAST TECHNIQUE: Multiplanar, multiecho pulse sequences of the brain and surrounding structures were obtained without intravenous contrast. COMPARISON:  Head CT 08/17/2023. FINDINGS: Mild intermittent  motion degradation. Brain: Generalized parenchymal atrophy. Small chronic cortical/subcortical infarct within the anterior left frontal lobe (MCA vascular territory). Multifocal T2 FLAIR hyperintense signal abnormality elsewhere within the cerebral white matter, nonspecific but compatible with moderate chronic small vessel ischemic disease. There is no acute infarct. No evidence of an intracranial mass. No chronic intracranial blood products. No extra-axial fluid collection. No midline shift. Vascular: Maintained flow voids within the proximal large arterial vessels. Skull and upper cervical spine: No focal worrisome marrow lesion. Sinuses/Orbits: No mass or acute finding within the imaged orbits. Postsurgical appearance of the paranasal sinuses. Mild mucosal thickening scattered within the paranasal sinuses. IMPRESSION: 1. Mildly motion degraded exam. 2. Small chronic cortical/subcortical infarct within the anterior left frontal lobe (MCA vascular territory). 3. Background moderate cerebral white matter chronic small vessel ischemic disease. 4.  Generalized parenchymal atrophy. 5. Mild paranasal sinus mucosal thickening. Electronically Signed   By: Jackey Loge D.O.   On: 08/18/2023 14:16        Scheduled Meds:  aspirin EC  81 mg Oral Daily   cyanocobalamin  1,000 mcg Oral Daily   docusate sodium  100 mg Oral Daily   enoxaparin (LOVENOX) injection  0.5 mg/kg Subcutaneous Q24H   ezetimibe  10 mg Oral Daily   finasteride  5 mg Oral Daily   furosemide  40 mg Oral Daily   hydrocortisone cream  1 Application Topical TID   icosapent Ethyl  2 g Oral BID   levothyroxine  175 mcg Oral QAC breakfast   linaclotide  290 mcg Oral QAC breakfast   melatonin  10 mg Oral QHS   metoprolol succinate  50 mg Oral Daily   pantoprazole  40 mg Oral Daily   polyethylene glycol  17 g Oral Daily   senna  1 tablet Oral Daily   sucralfate  1 g Oral BID   tamsulosin  0.4 mg Oral Daily   Continuous Infusions:  cefTRIAXone (ROCEPHIN)  IV 2 g (08/18/23 1404)     LOS: 1 day     Silvano Bilis, MD [facility]   If 7PM-7AM, please contact night-coverage [email or web address] Password [facility] 08/19/2023, 9:43 AM

## 2023-08-19 NOTE — Progress Notes (Addendum)
Physical Therapy Treatment Patient Details Name: Anthony [name] MRN: [id number] DOB: Aug 12, 1936 Today's Date: 08/19/2023   History of Present Illness Pt is an 87 y.o. male with medical history significant of a flutter status post ablation, chronic back pain status post spinal stimulator, HTN, HLD, hypothyroidism, BPH, OSA on CPAP at bedtime, CHF, CKD, was found on the floor confused by facility staff this morning and sent to ED for evaluation.    PT Comments  Pt ready for session.  Son arrives at start.  He is able to get to EOB today with supervision with [facility] raised and rails.  Generally steady in sitting.  He struggles to stand from lower bed height so it is raised and he stands with min a X 1.  Standing marches in place and SLR's.  He is able to sidestep to recliner with min a x 1 and overall forward lean.  He stands and attempts gait with +1 for gait and son following with recliner.  He has excessive forward lean and occasional L lean with cues to step up into walker.  I cued him to sit and +2 is obtained for my comfort where he is able to progress gait 84' with one standing rest break and overall improved gait but continues to need cues but overall better :"feel" with +2 assist and recliner follow.  Fatigued with effort and decreased gait quality noted but overall significant improvement from Friday.  Son stated pt does use upright walker with improved gait quality and thinks that may be helpful.  Pt is from ALF where he needs to be independent in wheelchair transfers at minimum.  Does not think there is someone to help him walk.  Will focus session tomorrow on transfers in hopes of being independent at transfer level to return to ALF.  ALF in to evaluate tomorrow.     If plan is discharge home, recommend the following: Two people to help with walking and/or transfers;Two people to help with bathing/dressing/bathroom;Direct supervision/assist for medications management;Help with stairs or ramp for  entrance;Assist for transportation;Assistance with feeding;Assistance with cooking/housework;Supervision due to cognitive status   Can travel by private vehicle        Equipment Recommendations       Recommendations for Other Services       Precautions / Restrictions Precautions Precautions: Fall Restrictions Weight Bearing Restrictions Per Provider Order: No     Mobility  Bed Mobility Overal bed mobility: Needs Assistance Bed Mobility: Supine to Sit     Supine to sit: Supervision, Used rails, HOB elevated       Patient Response: Cooperative  Transfers Overall transfer level: Needs assistance Equipment used: Rolling walker (2 wheels) Transfers: Sit to/from Stand Sit to Stand: Min assist           General transfer comment: difficulty from lower bed height but does better from recliner and raised bed.    Ambulation/Gait Ambulation/Gait assistance: Min assist, +2 safety/equipment Gait Distance (Feet): 80 Feet Assistive device: Rolling walker (2 wheels) Gait Pattern/deviations: Step-through pattern, Decreased step length - right, Decreased step length - left, Trunk flexed, Narrow base of support Gait velocity: dec     General Gait Details: cues for walker position and general safety.  son stated he has and does better with upright walker at ALF   Stairs             Wheelchair Mobility     Tilt Bed Tilt Bed Patient Response: Cooperative  Modified Rankin (Stroke Patients Only)  Balance Overall balance assessment: Needs assistance Sitting-balance support: Feet supported Sitting balance-Leahy Scale: Good     Standing balance support: Bilateral upper extremity supported Standing balance-Leahy Scale: Poor                              Cognition Arousal: Alert Behavior During Therapy: WFL for tasks assessed/performed Overall Cognitive Status: Within Functional Limits for tasks assessed                                           Exercises      General Comments        Pertinent Vitals/Pain Pain Assessment Pain Assessment: No/denies pain Pain Intervention(s): Monitored during session    Home Living                          Prior Function            PT Goals (current goals can now be found in the care plan section) Progress towards PT goals: Progressing toward goals    Frequency    Min 1X/week      PT Plan      Co-evaluation              AM-PAC PT "6 Clicks" Mobility   Outcome Measure  Help needed turning from your back to your side while in a flat bed without using bedrails?: A Little Help needed moving from lying on your back to sitting on the side of a flat bed without using bedrails?: A Little Help needed moving to and from a bed to a chair (including a wheelchair)?: A Little Help needed standing up from a chair using your arms (e.g., wheelchair or bedside chair)?: A Little Help needed to walk in hospital room?: A Lot Help needed climbing 3-5 steps with a railing? : Total 6 Click Score: 15    End of Session Equipment Utilized During Treatment: Gait belt Activity Tolerance: Patient tolerated treatment well Patient left: in chair;with call bell/phone within reach;with chair alarm set Nurse Communication: Mobility status PT Visit Diagnosis: Other abnormalities of gait and mobility (R26.89);Difficulty in walking, not elsewhere classified (R26.2);Muscle weakness (generalized) (M62.81)     Time: 4098-1191 PT Time Calculation (min) (ACUTE ONLY): 37 min  Charges:    $Gait Training: 23-37 mins PT General Charges $$ ACUTE PT VISIT: 1 Visit                   Danielle Dess, PTA 08/19/23, 10:30 AM

## 2023-08-19 NOTE — Plan of Care (Signed)
  Problem: Clinical Measurements: Goal: Diagnostic test results will improve Outcome: Progressing Goal: Respiratory complications will improve Outcome: Progressing   Problem: Coping: Goal: Level of anxiety will decrease Outcome: Progressing   Problem: Activity: Goal: Risk for activity intolerance will decrease Outcome: Not Progressing   Problem: Nutrition: Goal: Adequate nutrition will be maintained Outcome: Not Progressing   Problem: Pain Management: Goal: General experience of comfort will improve Outcome: Not Progressing

## 2023-08-20 DIAGNOSIS — G9341 Metabolic encephalopathy: Secondary | ICD-10-CM | POA: Diagnosis not present

## 2023-08-20 LAB — CBC
HCT: 41.6 % (ref 39.0–52.0)
Hemoglobin: 13.6 g/dL (ref 13.0–17.0)
MCH: 30.8 pg (ref 26.0–34.0)
MCHC: 32.7 g/dL (ref 30.0–36.0)
MCV: 94.1 fL (ref 80.0–100.0)
Platelets: 251 10*3/uL (ref 150–400)
RBC: 4.42 MIL/uL (ref 4.22–5.81)
RDW: 14.6 % (ref 11.5–15.5)
WBC: 7.9 10*3/uL (ref 4.0–10.5)
nRBC: 0 % (ref 0.0–0.2)

## 2023-08-20 LAB — BASIC METABOLIC PANEL
Anion gap: 7 (ref 5–15)
BUN: 35 mg/dL — ABNORMAL HIGH (ref 8–23)
CO2: 29 mmol/L (ref 22–32)
Calcium: 9.9 mg/dL (ref 8.9–10.3)
Chloride: 97 mmol/L — ABNORMAL LOW (ref 98–111)
Creatinine, Ser: 1.37 mg/dL — ABNORMAL HIGH (ref 0.61–1.24)
GFR, Estimated: 51 mL/min — ABNORMAL LOW (ref >60)
Glucose, Bld: 107 mg/dL — ABNORMAL HIGH (ref 70–99)
Potassium: 3.4 mmol/L — ABNORMAL LOW (ref 3.5–5.1)
Sodium: 133 mmol/L — ABNORMAL LOW (ref 135–145)

## 2023-08-20 MED ORDER — CEFDINIR 300 MG PO CAPS: 300.0000 mg | ORAL_CAPSULE | Freq: Two times a day (BID) | ORAL | 0 refills | Status: AC

## 2023-08-20 NOTE — Progress Notes (Signed)
Mobility Specialist - Progress Note   08/20/23 0955  Mobility  Activity Ambulated with assistance to bathroom  Level of Assistance Standby assist, set-up cues, supervision of patient - no hands on  Assistive Device Front wheel walker  Distance Ambulated (ft) 10 ft  Activity Response Tolerated well  Mobility visit 1 Mobility  Mobility Specialist Start Time (ACUTE ONLY) [id number]  Mobility Specialist Stop Time (ACUTE ONLY) 0953  Mobility Specialist Time Calculation (min) (ACUTE ONLY) 11 min   MS responding to chair alarm, Pt in the bathroom upon entry no AD, utilizing RA-- RW near the recliner. Pt sat on commode while MS completed bed change. Pt STS to RW and amb to bed SBA, tolerated well. Pt left supine with alarm set and needs within reach.  Zetta Bills Mobility Specialist 08/20/23 10:24 AM

## 2023-08-20 NOTE — Progress Notes (Signed)
Physical Therapy Treatment Patient Details Name: Anthony [name] MRN: [id number] DOB: 1936-01-05 Today's Date: 08/20/2023   History of Present Illness Pt is an 87 y.o. male with medical history significant of a flutter status post ablation, chronic back pain status post spinal stimulator, HTN, HLD, hypothyroidism, BPH, OSA on CPAP at bedtime, CHF, CKD, was found on the floor confused by facility staff this morning and sent to ED for evaluation.    PT Comments  Pt in bed.  Breakfast tray arrived.  Session focuses on bed mobility with bed flat and no rails.  He is able to get up left and right with time but no assist.  Steady in sitting and able to lat scoot left/right with ease.  Recliner positioned at bedside and he is asked to get into recliner.  Given RW but he stated he would not use it at [facility] for transfers.  Pt is able to stand/squat pivot to recliner at bedside with armrest support and generally good techniques and no hands on assist.  Remained in chair for breakfast with chair alarm set.    Called Son to see if he was bringing walker.  Discussed treatment today and that pt had demonstrated improved bed mobility and transfers and that session was focused on tasks to get him back to [place].  Agreed gait could be progressed with HHPT staff.  [place] to come access later today.  I will be available if needed to return for mobility if staff would like to see him move prior to return.    If plan is discharge home, recommend the following: A little help with walking and/or transfers;A little help with bathing/dressing/bathroom;Help with stairs or ramp for entrance;Assist for transportation;Direct supervision/assist for medications management;Assistance with cooking/housework   Can travel by private vehicle     Yes  Equipment Recommendations       Recommendations for Other Services       Precautions / Restrictions Precautions Precautions: Fall Restrictions Weight Bearing  Restrictions Per Provider Order: No     Mobility  Bed Mobility Overal bed mobility: Modified Independent Bed Mobility: Supine to Sit, Sit to Supine     Supine to sit: Modified independent (Device/Increase time) Sit to supine: Modified independent (Device/Increase time)   General bed mobility comments: is able to get supine <-> stt left and right side of bed with bed flat and no rails without assist    Transfers Overall transfer level: Needs assistance Equipment used: None Transfers: Bed to chair/wheelchair/BSC       Squat pivot transfers: Modified independent (Device/Increase time), Supervision     General transfer comment: transfers to recliner with ease and no AD.  holds rais of chair to assit with pivot and generally good technique    Ambulation/Gait               General Gait Details: deferred gait to focus on bed level and transfers to return to [employer]     Modified Rankin (Stroke Patients Only)       Balance Overall balance assessment: Needs assistance Sitting-balance support: Feet supported Sitting balance-Leahy Scale: Good     Standing balance support: Bilateral upper extremity supported Standing balance-Leahy Scale: Fair Standing balance comment: fair for transfer level.  Cognition Arousal: Alert Behavior During Therapy: WFL for tasks assessed/performed Overall Cognitive Status: History of cognitive impairments - at baseline                                 General Comments: some confusion remains        Exercises      General Comments        Pertinent Vitals/Pain Pain Assessment Pain Assessment: No/denies pain    Home Living                          Prior Function            PT Goals (current goals can now be found in the care plan section) Progress towards PT goals: Progressing toward goals     Frequency    Min 1X/week      PT Plan      Co-evaluation              AM-PAC PT "6 Clicks" Mobility   Outcome Measure  Help needed turning from your back to your side while in a flat bed without using bedrails?: None Help needed moving from lying on your back to sitting on the side of a flat bed without using bedrails?: None Help needed moving to and from a bed to a chair (including a wheelchair)?: None Help needed standing up from a chair using your arms (e.g., wheelchair or bedside chair)?: A Little Help needed to walk in hospital room?: A Lot Help needed climbing 3-5 steps with a railing? : Total 6 Click Score: 18    End of Session Equipment Utilized During Treatment: Gait belt Activity Tolerance: Patient tolerated treatment well Patient left: in chair;with call bell/phone within reach;with chair alarm set Nurse Communication: Mobility status PT Visit Diagnosis: Other abnormalities of gait and mobility (R26.89);Difficulty in walking, not elsewhere classified (R26.2);Muscle weakness (generalized) (M62.81)     Time: 1610-9604 PT Time Calculation (min) (ACUTE ONLY): 10 min  Charges:    $Therapeutic Activity: 8-22 mins PT General Charges $$ ACUTE PT VISIT: 1 Visit                   Danielle Dess, PTA 08/20/23, 10:33 AM

## 2023-08-20 NOTE — Hospital Course (Addendum)
HPI: Anthony [name] is a 87 y.o. male with medical history significant of a flutter status post ablation, chronic back pain status post spinal stimulator, HTN, HLD, hypothyroidism, BPH, OSA on CPAP at bedtime, was found on the floor 12/20 confused by facility staff and sent to ED for evaluation. Per H&P, pt admitted that for the last few days has been having urinary frequency and urgency which he attributed to possible " urinary infection has come back".  He denies any fever or chills no cough no abdominal pain no diarrhea.  Son over the phone reports that the patient might have mild dementia with poor short-term memory and has been undergoing screening test by PCP with a scheduled outpatient MRI next month.  No recent medication changes as per family.   Hospital course / significant events:  12/20: admitted w/ concern for encephalopathy. UA likely UTI, started abx.  12/21: UCX in process, was not ordered initially  12/22: SNF vs return to ALF. ALF will assess Mon 12/23 12/23: UCX NG. ALF assessment ***   Consultants:  none  Procedures/Surgeries: none      ASSESSMENT & PLAN:   Acute metabolic encephalopathy d/t UTI Complicated by underlying dementia Etiology of abrupt decline in mental status unclear. No hepatic or renal dysfunction or electrolyte abnormalities. Well nourished, doubt wernicke. Nothing acute seen on CT of head or MRI. No report of seizure-like activity. No fever or back pain or spinous process tenderness to suggest recurrence of prior discitis. Staff at ALF supply his meds so overdose unlikely. Encephalopathy improved, suggestive of UTI. MRI brain no acute findings / no new CVA f/u urine and blood cultures - have been no growth but aware antibiotics had already been given so suspect UCx is likely false negative    Prior CVA on MRI brain Statin intolerant  Carotid US calcified plaque w/ <50% stenosis R ICA origin, noncalcified plaque L ICA oriding <50% stenosis consider CTA  to further eval L.  continue ASA   Pyuria Acute cystitis? BPH Bladder scan performed, no retention. continue ceftriaxone f/u culture --> no growth   CAD cont home asa, metop   Chronic pain held home duloxetine, gabapentin, oxy, xanaflex given encephalopathy Restart ***    BPH able to urinate, no retention on bladder scan  cont home finasteride, flomax   HFpEF compensated cont home lasix   Hypothyroid Tsh mild elevation, t4 wnl cont home synthroid   HTN Here bp mildly elevated cont home metoprolol hydralazine prn   Debility PT advises snf but family prefers back to ALF with home health. His ALF is planning on evaluating the patient to see if he is well enough to return --> ***     *** based on BMI: There is no height or weight on file to calculate BMI.  Underweight - under 18  overweight - 25 to 29 obese - 30 or more Class 1 obesity: BMI of 30.0 to 34 Class 2 obesity: BMI of 35.0 to 39 Class 3 obesity: BMI of 40.0 to 49 Super Morbid Obesity: BMI 50-59 Super-super Morbid Obesity: BMI 60+ Significantly low or high BMI is associated with higher medical risk.  Weight management advised as adjunct to other disease management and risk reduction treatments    DVT prophylaxis: *** IV fluids: *** continuous IV fluids  Nutrition: *** Central lines / invasive devices: ***  Code Status: *** ACP documentation reviewed: *** none on file in [facility]  TOC needs: *** Barriers to dispo / significant pending items: ***

## 2023-08-20 NOTE — TOC Progression Note (Signed)
Transition of Care [facility]) - Progression Note    Patient Details  Name: Anthony [name] MRN: [id number] Date of Birth: 23-Jul-1936  [facility]) CM/SW Contact  Chapman Fitch, RN Phone Number: 08/20/2023, 10:49 AM  Clinical Narrative:     Per Misty Stanley with [name] it will be this afternoon when she is able to come and assess  Expected Discharge Plan: Assisted Living Barriers to Discharge: Continued Medical Work up  Expected Discharge Plan and Services       Living arrangements for the past 2 months: Assisted Living Facility                                       Social Determinants of Health (SDOH) Interventions SDOH Screenings   Food Insecurity: No Food Insecurity (08/18/2023)  Housing: Unknown (08/18/2023)  Transportation Needs: No Transportation Needs (04/19/2023)  Utilities: Patient Unable To Answer (08/18/2023)  Alcohol Screen: Low Risk  (12/15/2020)  Depression (PHQ2-9): Low Risk  (04/19/2023)  Financial Resource Strain: Low Risk  (04/19/2023)  Physical Activity: Insufficiently Active (04/19/2023)  Social Connections: Socially Isolated (04/19/2023)  Stress: No Stress Concern Present (04/19/2023)  Tobacco Use: Low Risk  (08/17/2023)  Health Literacy: Adequate Health Literacy (04/19/2023)    Readmission Risk Interventions     No data to display

## 2023-08-20 NOTE — TOC Transition Note (Signed)
Transition of Care [facility]) - Discharge Note   Patient Details  Name: Anthony [name] MRN: [id number] Date of Birth: Sep 10, 1935  [facility]) CM/SW Contact:  Chapman Fitch, RN Phone Number: 08/20/2023, 3:54 PM   Clinical Narrative:     Patient to return today to [facility]  Son [name] to transport at discharge He states that he does not have a preference of home health agency, and in agreement to use the previous provided that saw the patient. Referral made to [facility] with [facility].   Faxed Fl2 and dc summary to [place] at [place], Provided bedside RN number to call report     Barriers to Discharge: Continued Medical Work up   Patient Goals and CMS Choice Patient states their goals for this hospitalization and ongoing recovery are:: they prefer he return to ALF CMS Medicare.gov Compare Post Acute Care list provided to:: Patient Represenative (must comment) Choice offered to / list presented to : Adult Children, HC POA / Guardian      Discharge Placement                       Discharge Plan and Services Additional resources added to the After Visit Summary for                                       Social Drivers of Health (SDOH) Interventions SDOH Screenings   Food Insecurity: No Food Insecurity (08/18/2023)  Housing: Unknown (08/18/2023)  Transportation Needs: No Transportation Needs (04/19/2023)  Utilities: Patient Unable To Answer (08/18/2023)  Alcohol Screen: Low Risk  (12/15/2020)  Depression (PHQ2-9): Low Risk  (04/19/2023)  Financial Resource Strain: Low Risk  (04/19/2023)  Physical Activity: Insufficiently Active (04/19/2023)  Social Connections: Socially Isolated (04/19/2023)  Stress: No Stress Concern Present (04/19/2023)  Tobacco Use: Low Risk  (08/17/2023)  Health Literacy: Adequate Health Literacy (04/19/2023)     Readmission Risk Interventions     No data to display

## 2023-08-20 NOTE — Discharge Summary (Signed)
Physician Discharge Summary   [name] MRN: [id number]  DOB: Dec 08, 1935   Admit:     Date of Admission: 08/17/2023 Admitted from: assisted living   Discharge: Date of discharge: 08/20/23 Disposition: Home health Condition at discharge: good  CODE STATUS: DNR     Discharge Physician: Sunnie Nielsen, DO [facility]     PCP: Eustaquio Boyden, MD  Recommendations for Outpatient Follow-up:  Follow up with PCP Eustaquio Boyden, MD in 1-2 weeks Please obtain labs/tests: CBC, BMP. Consider CTA head/neck vs vascular surgery follow up for mild carotid stenosis  Please follow up on the following pending results: none   Discharge Instructions     Diet - low sodium heart healthy   Complete by: As directed    Increase activity slowly   Complete by: As directed          Discharge Diagnoses: Principal Problem:   AMS (altered mental status) Active Problems:   OSA on CPAP   BPH with urinary obstruction   History of atrial flutter   (HFpEF) heart failure with preserved ejection fraction (HCC)   Essential hypertension   Hypothyroidism   Severe obesity (BMI 35.0-39.9) with comorbidity (HCC)   CAD (coronary artery disease)   CKD (chronic kidney disease) stage 3, GFR 30-59 ml/min (HCC)   Chronic pain syndrome   Acute metabolic encephalopathy   UTI (urinary tract infection)   Dementia [facility])       Hospital Course: HPI: ELDEAN [name] is a 87 y.o. male with medical history significant of a flutter status post ablation, chronic back pain status post spinal stimulator, HTN, HLD, hypothyroidism, BPH, OSA on CPAP at bedtime, was found on the floor 12/20 confused by facility staff and sent to ED for evaluation. Per H&P, pt admitted that for the last few days has been having urinary frequency and urgency which he attributed to possible " urinary infection has come back".  He denies any fever or chills no cough no abdominal pain no diarrhea.  Son over  the phone reports that the patient might have mild dementia with poor short-term memory and has been undergoing screening test by PCP with a scheduled outpatient MRI next month.  No recent medication changes as per family.   Hospital course / significant events:  12/20: admitted w/ concern for encephalopathy. UA likely UTI, started abx.  12/21: UCX in process, was not ordered initially  12/22: SNF vs return to ALF. ALF will assess Mon 12/23 12/23: UCX NG. ALF assessment ok to receive him back    Consultants:  none  Procedures/Surgeries: none      ASSESSMENT & PLAN:   Acute metabolic encephalopathy d/t UTI Complicated by underlying dementia Etiology of abrupt decline in mental status unclear. No hepatic or renal dysfunction or electrolyte abnormalities. Well nourished, doubt wernicke. Nothing acute seen on CT of head or MRI. No report of seizure-like activity. No fever or back pain or spinous process tenderness to suggest recurrence of prior discitis. Staff at ALF supply his meds so overdose unlikely. Encephalopathy improved, suggestive of UTI. MRI brain no acute findings / no new CVA f/u urine and blood cultures - have been no growth but aware antibiotics had already been given so suspect UCx is likely false negative  Complete course omnicef    Prior CVA on MRI brain Statin intolerant  Carotid US calcified plaque w/ <50% stenosis R ICA origin, noncalcified plaque L ICA oriding <50% stenosis consider CTA to further eval L.  continue ASA Vascular follow up outpatient if there is consideration for intervention Would consider PCSK9i if intolerant to statin    Pyuria Acute cystitis? BPH Bladder scan performed, no retention. continue ceftriaxone f/u culture --> no growth   CAD cont home asa, metop   Chronic pain held home duloxetine, gabapentin, oxy, xanaflex given encephalopathy Restart home meds but caution given AMS though this was likely fd/t UTI polypharmacy is always a  concern    BPH able to urinate, no retention on bladder scan  cont home finasteride, flomax   HFpEF compensated cont home lasix   Hypothyroid Tsh mild elevation, t4 wnl cont home synthroid   HTN Here bp mildly elevated cont home metoprolol hydralazine prn   Debility PT advises snf but family prefers back to ALF with home health. His ALF is planning on evaluating the patient to see if he is well enough to return --> ALF will accept him back        Discharge Instructions  Allergies as of 08/20/2023       Reactions   Questran [cholestyramine]    Patient not aware of an allergy to this medicine.   Atorvastatin    Muscle pain   Gemfibrozil Other (See Comments)   Muscle pain.   Metformin And Related    Dizziness   Rosuvastatin    Muscle pain        Medication List     TAKE these medications    acetaminophen 500 MG tablet Commonly known as: TYLENOL Take 1 tablet (500 mg total) by mouth 2 (two) times daily as needed for moderate pain.   aspirin EC 81 MG tablet Take 1 tablet (81 mg total) by mouth daily.   B-12 1000 MCG Subl Place 1 tablet under the tongue daily.   cefdinir 300 MG capsule Commonly known as: OMNICEF Take 1 capsule (300 mg total) by mouth 2 (two) times daily for 5 days.   ciclopirox 8 % solution Commonly known as: PENLAC APPLY TOPICALLY AT BEDTIME APPLY OVER NAIL AND SURROUNDING SKIN, APPLY DAILY OVER PREVIOUS COAT, AFTER 7 DAYS MAY REMOVE WITH ALCOHOL AND REPEAT   Cymbalta 20 MG capsule Generic drug: DULoxetine Take 1 capsule (20 mg total) by mouth daily.   diphenhydramine-acetaminophen 25-500 MG Tabs tablet Commonly known as: TYLENOL PM Take 1 tablet by mouth at bedtime.   docusate sodium 100 MG capsule Commonly known as: COLACE Take 100 mg by mouth daily.   ezetimibe 10 MG tablet Commonly known as: ZETIA Take 1 tablet (10 mg total) by mouth daily.   finasteride 5 MG tablet Commonly known as: PROSCAR TAKE 1 TABLET(5 MG) BY  MOUTH DAILY   fluocinolone 0.025 % cream Commonly known as: SYNALAR Apply topically 2 (two) times daily as needed.   furosemide 40 MG tablet Commonly known as: LASIX Take 1 tablet (40 mg total) by mouth daily.   gabapentin 300 MG capsule Commonly known as: NEURONTIN TAKE 2 CAPSULE BY MOUTH AT BEDTIME WITH EXTRA CAPSULE DURING THE DAY AS NEEDED   hydrocortisone 2.5 % cream APPLY TO FACE AT BEDTIME TUES, THURS, SAT   ketoconazole 2 % shampoo Commonly known as: NIZORAL APPLY TOPICALLY 3 TIMES WEEKLY AS DIRECTED   ketoconazole 2 % cream Commonly known as: NIZORAL APPLY TO FACE AT BEDTIME MON, WED, FRI   levothyroxine 175 MCG tablet Commonly known as: SYNTHROID Take 1 tablet (175 mcg total) by mouth daily before breakfast.   lidocaine 4 % Place 1 patch onto the skin daily.  OTC   Linzess 290 MCG Caps capsule Generic drug: linaclotide TAKE 1 CAPSULE(290 MCG) BY MOUTH DAILY BEFORE BREAKFAST   Melatonin 10 MG Caps Take 10 mg by mouth at bedtime.   meloxicam 7.5 MG tablet Commonly known as: MOBIC Take 7.5 mg by mouth daily.   metoprolol succinate 50 MG 24 hr tablet Commonly known as: TOPROL-XL Take 1 tablet (50 mg total) by mouth daily.   mometasone 0.1 % cream Commonly known as: ELOCON Apply 1 Application topically every Monday, Wednesday, and Friday. bedtime   nitroGLYCERIN 0.4 MG SL tablet Commonly known as: NITROSTAT Place 1 tablet (0.4 mg total) under the tongue every 5 (five) minutes as needed for chest pain.   ondansetron 4 MG tablet Commonly known as: ZOFRAN Take 4 mg by mouth every 6 (six) hours as needed for nausea.   Oxycodone HCl 10 MG Tabs Take 1 tablet (10 mg total) by mouth 2 (two) times daily as needed (moderate pain).   polyethylene glycol 17 g packet Commonly known as: MIRALAX / GLYCOLAX Take 17 g by mouth daily.   Praluent 75 MG/ML Soaj Generic drug: Alirocumab ADMINISTER 75MG  UNDER THE SKIN EVERY 14 DAYS   PRESERVISION AREDS 2 PO Take 1  tablet by mouth daily.   CENTRUM SILVER PO Take 1 tablet by mouth daily.   Protonix 40 MG tablet Generic drug: pantoprazole Take 1 tablet (40 mg total) by mouth daily.   senna 8.6 MG tablet Commonly known as: SENOKOT Take 1 tablet (8.6 mg total) by mouth daily.   sucralfate 1 g tablet Commonly known as: CARAFATE Take 1 g by mouth 2 (two) times daily.   tamsulosin 0.4 MG Caps capsule Commonly known as: FLOMAX Take 1 capsule (0.4 mg total) by mouth daily.   tizanidine 2 MG capsule Commonly known as: ZANAFLEX Take 2 mg by mouth 2 (two) times daily as needed for muscle spasms.   Vascepa 1 g capsule Generic drug: icosapent Ethyl Take 2 capsules (2 g total) by mouth 2 (two) times daily.   Vitamin D (Cholecalciferol) 25 MCG (1000 UT) Tabs Take 1 tablet by mouth daily.         Follow-up Information     Eustaquio Boyden, MD. Schedule an appointment as soon as possible for a visit.   Specialty: Family Medicine Why: hospital follow up Contact information: [address] [place] [address] [phone]                 Allergies  Allergen Reactions   Questran [Cholestyramine]     Patient not aware of an allergy to this medicine.   Atorvastatin     Muscle pain   Gemfibrozil Other (See Comments)    Muscle pain.   Metformin And Related     Dizziness   Rosuvastatin     Muscle pain     Subjective: pt has no concerns or complaints this morning, states he is feeling okay, denies pain, denies SOB, reports tolerating diet   Discharge Exam: BP (!) 144/82 (BP Location: Right Arm)   Pulse 70   Temp 97.7 F (36.5 C) (Oral)   Resp 20   SpO2 93%  General: Pt is alert, awake, not in acute distress Cardiovascular: RRR, S1/S2 +, no rubs, no gallops Respiratory: CTA bilaterally, no wheezing, no rhonchi Abdominal: Soft, NT, ND, bowel sounds + Extremities: no edema, no cyanosis     The results of significant diagnostics from this hospitalization  (including imaging, microbiology, ancillary and laboratory) are  listed below for reference.     Microbiology: Recent Results (from the past 240 hours)  Resp panel by RT-PCR (RSV, Flu A&B, Covid) Anterior Nasal Swab     Status: None   Collection Time: 08/17/23  7:38 AM   Specimen: Anterior Nasal Swab  Result Value Ref Range Status   SARS Coronavirus 2 by RT PCR NEGATIVE NEGATIVE Final    Comment: (NOTE) SARS-CoV-2 target nucleic acids are NOT DETECTED.  The SARS-CoV-2 RNA is generally detectable in upper respiratory specimens during the acute phase of infection. The lowest concentration of SARS-CoV-2 viral copies this assay can detect is 138 copies/mL. A negative result does not preclude SARS-Cov-2 infection and should not be used as the sole basis for treatment or other patient management decisions. A negative result may occur with  improper specimen collection/handling, submission of specimen other than nasopharyngeal swab, presence of viral mutation(s) within the areas targeted by this assay, and inadequate number of viral copies(<138 copies/mL). A negative result must be combined with clinical observations, patient history, and epidemiological information. The expected result is Negative.  Fact Sheet for Patients:  BloggerCourse.com  Fact Sheet for Healthcare Providers:  SeriousBroker.it  This test is no t yet approved or cleared by the [place] FDA and  has been authorized for detection and/or diagnosis of SARS-CoV-2 by FDA under an Emergency Use Authorization (EUA). This EUA will remain  in effect (meaning this test can be used) for the duration of the COVID-19 declaration under Section 564(b)(1) of the Act, 21 U.S.C.section 360bbb-3(b)(1), unless the authorization is terminated  or revoked sooner.       Influenza A by PCR NEGATIVE NEGATIVE Final   Influenza B by PCR NEGATIVE NEGATIVE Final    Comment: (NOTE) The  Xpert Xpress SARS-CoV-2/FLU/RSV plus assay is intended as an aid in the diagnosis of influenza from Nasopharyngeal swab specimens and should not be used as a sole basis for treatment. Nasal washings and aspirates are unacceptable for Xpert Xpress SARS-CoV-2/FLU/RSV testing.  Fact Sheet for Patients: BloggerCourse.com  Fact Sheet for Healthcare Providers: SeriousBroker.it  This test is not yet approved or cleared by the [place] FDA and has been authorized for detection and/or diagnosis of SARS-CoV-2 by FDA under an Emergency Use Authorization (EUA). This EUA will remain in effect (meaning this test can be used) for the duration of the COVID-19 declaration under Section 564(b)(1) of the Act, 21 U.S.C. section 360bbb-3(b)(1), unless the authorization is terminated or revoked.     Resp Syncytial Virus by PCR NEGATIVE NEGATIVE Final    Comment: (NOTE) Fact Sheet for Patients: BloggerCourse.com  Fact Sheet for Healthcare Providers: SeriousBroker.it  This test is not yet approved or cleared by the [place] FDA and has been authorized for detection and/or diagnosis of SARS-CoV-2 by FDA under an Emergency Use Authorization (EUA). This EUA will remain in effect (meaning this test can be used) for the duration of the COVID-19 declaration under Section 564(b)(1) of the Act, 21 U.S.C. section 360bbb-3(b)(1), unless the authorization is terminated or revoked.  Performed at [facility], [address] [place], [place] [address]   Culture, blood (Routine X 2) w Reflex to ID Panel     Status: None (Preliminary result)   Collection Time: 08/18/23 10:56 AM   Specimen: BLOOD  Result Value Ref Range Status   Specimen Description BLOOD [facility]  Final   Special Requests   Final    BOTTLES DRAWN AEROBIC AND ANAEROBIC Blood Culture adequate volume  Culture   Final    NO GROWTH  2 DAYS Performed at [facility], [address] [place], [place] [address]    Report Status PENDING  Incomplete  Culture, blood (Routine X 2) w Reflex to ID Panel     Status: None (Preliminary result)   Collection Time: 08/18/23 10:59 AM   Specimen: BLOOD  Result Value Ref Range Status   Specimen Description BLOOD RAC  Final   Special Requests   Final    BOTTLES DRAWN AEROBIC AND ANAEROBIC Blood Culture adequate volume   Culture   Final    NO GROWTH 2 DAYS Performed at [facility], [address] [place], [place] [address]    Report Status PENDING  Incomplete  Urine Culture     Status: None   Collection Time: 08/18/23 11:50 AM   Specimen: Urine, Clean Catch  Result Value Ref Range Status   Specimen Description   Final    URINE, CLEAN CATCH Performed at [facility], [address] [place] [address], [place], [place] [address]    Special Requests   Final    NONE Performed at [facility], [address] [place], [place] [address]    Culture   Final    NO GROWTH Performed at [facility], [address] [place], [place] [address]    Report Status 08/19/2023 FINAL  Final     Labs: BNP (last 3 results) No results for input(s): "BNP" in the last 8760 hours. Basic Metabolic Panel: Recent Labs  Lab 08/17/23 0737 08/18/23 1932 08/19/23 0836 08/20/23 0514  NA 135 134* 134* 133*  K 4.0 3.6 3.4* 3.4*  CL 97* 99 98 97*  CO2 28 27 27 29   GLUCOSE 145* 144* 115* 107*  BUN 24* 21 25* 35*  CREATININE 1.34* 1.06 1.26* 1.37*  CALCIUM 10.3 10.0 9.9 9.9   Liver Function Tests: Recent Labs  Lab 08/17/23 0737 08/18/23 1932  AST 25 28  ALT 17 14  ALKPHOS 84 68  BILITOT 1.1 1.0  PROT 8.1 7.3  ALBUMIN 4.3 3.9   No results for input(s): "LIPASE", "AMYLASE" in the last 168 hours. No results for input(s): "AMMONIA" in the last 168 hours. CBC: Recent Labs  Lab 08/17/23 0737 08/18/23 0529 08/19/23 0836 08/20/23 0514  WBC 12.5* 11.5* 9.0  7.9  NEUTROABS 8.3*  --   --   --   HGB 14.6 14.0 13.1 13.6  HCT 43.4 41.0 38.8* 41.6  MCV 91.4 91.5 92.2 94.1  PLT 264 233 234 251   Cardiac Enzymes: Recent Labs  Lab 08/17/23 0746  CKTOTAL 181   BNP: Invalid input(s): "POCBNP" CBG: No results for input(s): "GLUCAP" in the last 168 hours. D-Dimer No results for input(s): "DDIMER" in the last 72 hours. Hgb A1c No results for input(s): "HGBA1C" in the last 72 hours. Lipid Profile No results for input(s): "CHOL", "HDL", "LDLCALC", "TRIG", "CHOLHDL", "LDLDIRECT" in the last 72 hours. Thyroid function studies No results for input(s): "TSH", "T4TOTAL", "T3FREE", "THYROIDAB" in the last 72 hours.  Invalid input(s): "FREET3" Anemia work up Recent Labs    08/18/23 1059  VITAMINB12 517   Urinalysis    Component Value Date/Time   COLORURINE YELLOW (A) 08/17/2023 1100   APPEARANCEUR CLEAR (A) 08/17/2023 1100   LABSPEC 1.021 08/17/2023 1100   PHURINE 7.0 08/17/2023 1100   GLUCOSEU NEGATIVE 08/17/2023 1100   HGBUR NEGATIVE 08/17/2023 1100   BILIRUBINUR NEGATIVE 08/17/2023 1100   BILIRUBINUR negative 07/25/2023 1702  KETONESUR NEGATIVE 08/17/2023 1100   PROTEINUR 100 (A) 08/17/2023 1100   UROBILINOGEN 0.2 07/25/2023 1702   NITRITE NEGATIVE 08/17/2023 1100   LEUKOCYTESUR TRACE (A) 08/17/2023 1100   Sepsis Labs Recent Labs  Lab 08/17/23 0737 08/18/23 0529 08/19/23 0836 08/20/23 0514  WBC 12.5* 11.5* 9.0 7.9   Microbiology Recent Results (from the past 240 hours)  Resp panel by RT-PCR (RSV, Flu A&B, Covid) Anterior Nasal Swab     Status: None   Collection Time: 08/17/23  7:38 AM   Specimen: Anterior Nasal Swab  Result Value Ref Range Status   SARS Coronavirus 2 by RT PCR NEGATIVE NEGATIVE Final    Comment: (NOTE) SARS-CoV-2 target nucleic acids are NOT DETECTED.  The SARS-CoV-2 RNA is generally detectable in upper respiratory specimens during the acute phase of infection. The lowest concentration of SARS-CoV-2  viral copies this assay can detect is 138 copies/mL. A negative result does not preclude SARS-Cov-2 infection and should not be used as the sole basis for treatment or other patient management decisions. A negative result may occur with  improper specimen collection/handling, submission of specimen other than nasopharyngeal swab, presence of viral mutation(s) within the areas targeted by this assay, and inadequate number of viral copies(<138 copies/mL). A negative result must be combined with clinical observations, patient history, and epidemiological information. The expected result is Negative.  Fact Sheet for Patients:  BloggerCourse.com  Fact Sheet for Healthcare Providers:  SeriousBroker.it  This test is no t yet approved or cleared by the [place] FDA and  has been authorized for detection and/or diagnosis of SARS-CoV-2 by FDA under an Emergency Use Authorization (EUA). This EUA will remain  in effect (meaning this test can be used) for the duration of the COVID-19 declaration under Section 564(b)(1) of the Act, 21 U.S.C.section 360bbb-3(b)(1), unless the authorization is terminated  or revoked sooner.       Influenza A by PCR NEGATIVE NEGATIVE Final   Influenza B by PCR NEGATIVE NEGATIVE Final    Comment: (NOTE) The Xpert Xpress SARS-CoV-2/FLU/RSV plus assay is intended as an aid in the diagnosis of influenza from Nasopharyngeal swab specimens and should not be used as a sole basis for treatment. Nasal washings and aspirates are unacceptable for Xpert Xpress SARS-CoV-2/FLU/RSV testing.  Fact Sheet for Patients: BloggerCourse.com  Fact Sheet for Healthcare Providers: SeriousBroker.it  This test is not yet approved or cleared by the [place] FDA and has been authorized for detection and/or diagnosis of SARS-CoV-2 by FDA under an Emergency Use Authorization  (EUA). This EUA will remain in effect (meaning this test can be used) for the duration of the COVID-19 declaration under Section 564(b)(1) of the Act, 21 U.S.C. section 360bbb-3(b)(1), unless the authorization is terminated or revoked.     Resp Syncytial Virus by PCR NEGATIVE NEGATIVE Final    Comment: (NOTE) Fact Sheet for Patients: BloggerCourse.com  Fact Sheet for Healthcare Providers: SeriousBroker.it  This test is not yet approved or cleared by the [place] FDA and has been authorized for detection and/or diagnosis of SARS-CoV-2 by FDA under an Emergency Use Authorization (EUA). This EUA will remain in effect (meaning this test can be used) for the duration of the COVID-19 declaration under Section 564(b)(1) of the Act, 21 U.S.C. section 360bbb-3(b)(1), unless the authorization is terminated or revoked.  Performed at [facility], [address] [place], [place] [address]   Culture, blood (Routine X 2) w Reflex to ID Panel     Status: None (Preliminary  result)   Collection Time: 08/18/23 10:56 AM   Specimen: BLOOD  Result Value Ref Range Status   Specimen Description BLOOD [facility]  Final   Special Requests   Final    BOTTLES DRAWN AEROBIC AND ANAEROBIC Blood Culture adequate volume   Culture   Final    NO GROWTH 2 DAYS Performed at [facility], [address] [place], [place] [address]    Report Status PENDING  Incomplete  Culture, blood (Routine X 2) w Reflex to ID Panel     Status: None (Preliminary result)   Collection Time: 08/18/23 10:59 AM   Specimen: BLOOD  Result Value Ref Range Status   Specimen Description BLOOD RAC  Final   Special Requests   Final    BOTTLES DRAWN AEROBIC AND ANAEROBIC Blood Culture adequate volume   Culture   Final    NO GROWTH 2 DAYS Performed at [facility], [address] [place], [place] [address]    Report Status PENDING  Incomplete  Urine  Culture     Status: None   Collection Time: 08/18/23 11:50 AM   Specimen: Urine, Clean Catch  Result Value Ref Range Status   Specimen Description   Final    URINE, CLEAN CATCH Performed at [facility], [address] [place], [place] [address]    Special Requests   Final    NONE Performed at [facility], [address], [place], [place] [address]    Culture   Final    NO GROWTH Performed at [facility], [address] [place], [place] [address]    Report Status 08/19/2023 FINAL  Final   Imaging CT ABDOMEN PELVIS W CONTRAST Result Date: 08/18/2023 CLINICAL DATA:  Possible bowel obstruction.  Intractable vomiting. EXAM: CT ABDOMEN AND PELVIS WITH CONTRAST TECHNIQUE: Multidetector CT imaging of the abdomen and pelvis was performed using the standard protocol following bolus administration of intravenous contrast. RADIATION DOSE REDUCTION: This exam was performed according to the departmental dose-optimization program which includes automated exposure control, adjustment of the mA and/or kV according to patient size and/or use of iterative reconstruction technique. CONTRAST:  OMNIPAQUE IOHEXOL 350 MG/ML SOLN COMPARISON:  05/22/2022, 10/30/2021 FINDINGS: Lower chest: Mild cardiomegaly. Moderate calcified plaque over the left anterior descending coronary artery. Thoracic aorta is otherwise normal in caliber. Lung bases are clear. Hepatobiliary: 2 moderate size gallstones are present. 1.2 cm hypodensity over the right lobe of the liver unchanged likely a cyst. Biliary tree is normal. Pancreas: Normal. Spleen: Normal. Adrenals/Urinary Tract: Adrenal glands are normal. Kidneys are normal in size. Multiple bilateral renal cysts with the largest over the lower pole right kidney measuring 3.7 cm with Hounsfield unit measurements less than 10. These are stable. Stable 2.2 cm mass with Hounsfield unit measurements of 37 over the mid pole right kidney likely a hemorrhagic  cyst, but indeterminate. No hydronephrosis or nephrolithiasis. Ureters are normal. Bladder is somewhat decompressed with possible focal wall thickening over the right anterior bladder wall. Stomach/Bowel: Stomach and small bowel are normal. Appendix not visualized. Colon is unremarkable. Vascular/Lymphatic: Moderate calcified plaque over the abdominal aorta. Patient is post abdominal aortic aneurysm repair with aortoiliac stent graft present beginning just below the renal arteries. Graft is patent and unchanged. Moderate opacification of the native aneurysm sac. Native aneurysm sac measures approximately 4.8 cm in AP diameter (previously 4.5 cm). No evidence of adenopathy. Reproductive: Normal. Other: No significant free fluid or focal inflammatory change. Musculoskeletal: Moderate degenerative changes  of the spine with multilevel disc disease over the lumbar spine. Mild degenerative change of the hips. IMPRESSION: 1. No acute findings in the abdomen/pelvis. No evidence of bowel obstruction. 2. Cholelithiasis. 3. 1.2 cm hypodensity over the right lobe of the liver unchanged likely a cyst. 4. Multiple bilateral renal cysts with the largest over the lower pole right kidney measuring 3.7 cm. Stable 2.2 cm mass over the mid pole right kidney likely a hemorrhagic cyst, but indeterminate. Recommend follow-up renal ultrasound versus renal protocol CT and 6 months. 5. Possible focal wall thickening over the right anterior bladder wall. Recommend urology protocol CT for further evaluation. 6. Aortic atherosclerosis. Post abdominal aortic aneurysm repair with aortoiliac stent graft present. Native aneurysm sac measures approximately 4.8 cm in AP diameter (previously 4.5 cm). Aortic Atherosclerosis (ICD10-I70.0). Electronically Signed   By: Elberta Fortis M.D.   On: 08/18/2023 21:36   MR BRAIN WO CONTRAST Result Date: 08/18/2023 CLINICAL DATA:  Provided history: Encephalopathy. EXAM: MRI HEAD WITHOUT CONTRAST TECHNIQUE:  Multiplanar, multiecho pulse sequences of the brain and surrounding structures were obtained without intravenous contrast. COMPARISON:  Head CT 08/17/2023. FINDINGS: Mild intermittent motion degradation. Brain: Generalized parenchymal atrophy. Small chronic cortical/subcortical infarct within the anterior left frontal lobe (MCA vascular territory). Multifocal T2 FLAIR hyperintense signal abnormality elsewhere within the cerebral white matter, nonspecific but compatible with moderate chronic small vessel ischemic disease. There is no acute infarct. No evidence of an intracranial mass. No chronic intracranial blood products. No extra-axial fluid collection. No midline shift. Vascular: Maintained flow voids within the proximal large arterial vessels. Skull and upper cervical spine: No focal worrisome marrow lesion. Sinuses/Orbits: No mass or acute finding within the imaged orbits. Postsurgical appearance of the paranasal sinuses. Mild mucosal thickening scattered within the paranasal sinuses. IMPRESSION: 1. Mildly motion degraded exam. 2. Small chronic cortical/subcortical infarct within the anterior left frontal lobe (MCA vascular territory). 3. Background moderate cerebral white matter chronic small vessel ischemic disease. 4. Generalized parenchymal atrophy. 5. Mild paranasal sinus mucosal thickening. Electronically Signed   By: Jackey Loge D.O.   On: 08/18/2023 14:16   DG Chest 2 View Result Date: 08/17/2023 CLINICAL DATA:  Altered level of consciousness, fall, weakness EXAM: CHEST - 2 VIEW COMPARISON:  05/27/2022 FINDINGS: Frontal and lateral views of the chest demonstrate an unremarkable cardiac silhouette. No acute airspace disease, effusion, or pneumothorax. No acute bony abnormalities. IMPRESSION: 1. No acute intrathoracic process. Electronically Signed   By: Sharlet Salina M.D.   On: 08/17/2023 08:31   CT Head Wo Contrast Result Date: 08/17/2023 CLINICAL DATA:  Head trauma. Altered mental status.  Status post fall. EXAM: CT HEAD WITHOUT CONTRAST TECHNIQUE: Contiguous axial images were obtained from the base of the skull through the vertex without intravenous contrast. RADIATION DOSE REDUCTION: This exam was performed according to the departmental dose-optimization program which includes automated exposure control, adjustment of the mA and/or kV according to patient size and/or use of iterative reconstruction technique. COMPARISON:  10/30/2021 FINDINGS: Brain: No evidence of acute infarction, hemorrhage, hydrocephalus, extra-axial collection or mass lesion/mass effect. There is mild diffuse low-attenuation within the subcortical and periventricular white matter compatible with chronic microvascular disease. Prominence of the sulci and ventricles. Vascular: No hyperdense vessel or unexpected calcification. Skull: Normal. Negative for fracture or focal lesion. Sinuses/Orbits: Previous bilateral median antrectomy. Partial opacification of the sphenoid sinus. Air-fluid scratch set air-fluid level is noted within the sphenoid sinus. Partial opacification of the frontal sinuses and ethmoid air cells. Mastoid air cells are  clear. Other: No scalp hematoma identified IMPRESSION: 1. No acute intracranial abnormalities. 2. Chronic microvascular disease and cerebral atrophy. 3. Chronic sinus inflammation. Electronically Signed   By: Signa Kell M.D.   On: 08/17/2023 08:31   CT Cervical Spine Wo Contrast Result Date: 08/17/2023 CLINICAL DATA:  Neck trauma.  Altered mental status after falling. EXAM: CT CERVICAL SPINE WITHOUT CONTRAST TECHNIQUE: Multidetector CT imaging of the cervical spine was performed without intravenous contrast. Multiplanar CT image reconstructions were also generated. RADIATION DOSE REDUCTION: This exam was performed according to the departmental dose-optimization program which includes automated exposure control, adjustment of the mA and/or kV according to patient size and/or use of iterative  reconstruction technique. COMPARISON:  CT thoracic spine 08/19/2018 FINDINGS: Alignment: Reversal of the usual cervical lordosis with a slight degenerative anterolisthesis at C2-3, C3-4 and C5-6. Skull base and vertebrae: No evidence of acute cervical spine fracture or traumatic subluxation. Multilevel spondylosis with disc space narrowing, uncinate spurring and facet hypertrophy. Soft tissues and spinal canal: No prevertebral fluid or swelling. No visible canal hematoma. Disc levels: Multilevel spondylosis with disc space narrowing, uncinate spurring and facet hypertrophy. There is resulting mild-to-moderate foraminal narrowing at multiple levels, greatest on the left at C4-5 and bilaterally at C5-6. There is also mild central spinal stenosis at multiple levels, but no large disc herniation identified. Upper chest: Clear lung apices. Other: Bilateral carotid atherosclerosis. No acute soft tissue abnormalities are identified. IMPRESSION: 1. No evidence of acute cervical spine fracture, traumatic subluxation or static signs of instability. 2. Multilevel cervical spondylosis as described. Electronically Signed   By: Carey Bullocks M.D.   On: 08/17/2023 08:29      Time coordinating discharge: over 30 minutes  SIGNED:  Sunnie Nielsen DO [facility]

## 2023-08-20 NOTE — [place] FL2 (Signed)
[place] MEDICAID FL2 LEVEL OF CARE FORM     IDENTIFICATION  Patient Name: Anthony [name] Birthdate: June 25, 1936 Sex: male Admission Date (Current Location): 08/17/2023  [facility] and [place] Number:  Chiropodist and Address:         Provider Number: [id number]  Attending Physician Name and Address:  Sunnie Nielsen, DO  Relative Name and Phone Number:       Current Level of Care: Hospital Recommended Level of Care: Assisted Living Facility Prior Approval Number:    Date Approved/Denied:   PASRR Number:    Discharge Plan: Other (Comment) (ALF)    Current Diagnoses: Patient Active Problem List   Diagnosis Date Noted   Dementia (HCC) 08/18/2023   Acute metabolic encephalopathy 08/17/2023   UTI (urinary tract infection) 08/17/2023   AMS (altered mental status) 08/17/2023   Statin myopathy 08/14/2023   Low serum vitamin B12 01/06/2023   DNR (do not resuscitate) 01/06/2023   Amnestic MCI (mild cognitive impairment with memory loss) 08/31/2022   Peptic ulcer disease 07/29/2022   Failed spinal cord stimulator (HCC) 06/02/2022   Lumbar radiculopathy 05/08/2022   Lumbar spondylosis 01/31/2022   Lumbar degenerative disc disease 01/31/2022   Chronic pain syndrome 01/31/2022   Osteoarthritis of ankle 01/17/2022   Medicare annual wellness visit, subsequent 12/29/2021   Prediabetes 12/29/2021   Fatigue 12/31/2020   S/P endovascular aneurysm repair 04/2020   Eosinophilia 04/11/2020   Varicose veins of leg with pain, bilateral 04/06/2020   General unsteadiness 02/07/2020   Encounter for chronic pain management 07/04/2019   Essential tremor 05/07/2019   Cystic kidney disease 04/15/2019   Osteoarthritis 04/15/2019   Seasonal allergies 04/15/2019   Anosmia 02/17/2019   Advanced care planning/counseling discussion 11/05/2018   Vitamin D deficiency 10/24/2018   CKD (chronic kidney disease) stage 3, GFR 30-59 ml/min (HCC) 10/18/2017   Therapeutic opioid-induced  constipation (OIC) 10/03/2017   Chronic insomnia    Essential hypertension    Hypothyroidism    Severe obesity (BMI 35.0-39.9) with comorbidity (HCC)    CAD (coronary artery disease)    Chronic lower back pain    History of atrial flutter 04/15/2017   History of basal cell carcinoma (BCC) 04/12/2017   History of squamous cell carcinoma in situ of skin 04/12/2017   Hypercholesterolemia with hypertriglyceridemia 04/12/2017   Chronic constipation 04/12/2017   OSA on CPAP 04/12/2017   BPH with urinary obstruction 04/12/2017   (HFpEF) heart failure with preserved ejection fraction (HCC) 02/18/2017   Abdominal aortic aneurysm (AAA) without rupture (HCC) 01/09/2016   Calculus of gallbladder without cholecystitis 01/09/2016   Diverticulitis of sigmoid colon 01/09/2016   Spondylolisthesis of lumbar region 01/09/2016   Recurrent major depression in remission (HCC) 03/28/2013   Esophageal reflux 09/21/2011   Spinal stenosis 04/20/2011    Orientation RESPIRATION BLADDER Height & Weight     Self, Place  Normal Incontinent Weight:   Height:     BEHAVIORAL SYMPTOMS/MOOD NEUROLOGICAL BOWEL NUTRITION STATUS      Incontinent Diet (low sodium heart healthy)  AMBULATORY STATUS COMMUNICATION OF NEEDS Skin   Extensive Assist Verbally Normal                       Personal Care Assistance Level of Assistance              Functional Limitations Info             SPECIAL CARE FACTORS FREQUENCY  PT (By licensed PT), OT (By  licensed OT)     PT Frequency: [facility] OT Frequency: [facility]            Contractures Contractures Info: Not present    Additional Factors Info  Code Status, Allergies Code Status Info: DNR Allergies Info: Questran (Cholestyramine), Atorvastatin, Gemfibrozil, Metformin And Related, Rosuvastatin           TAKE these medications     acetaminophen 500 MG tablet Commonly known as: TYLENOL Take 1 tablet (500 mg total) by mouth 2 (two) times daily as  needed for moderate pain.    aspirin EC 81 MG tablet Take 1 tablet (81 mg total) by mouth daily.    B-12 1000 MCG Subl Place 1 tablet under the tongue daily.    cefdinir 300 MG capsule Commonly known as: OMNICEF Take 1 capsule (300 mg total) by mouth 2 (two) times daily for 5 days.    ciclopirox 8 % solution Commonly known as: PENLAC APPLY TOPICALLY AT BEDTIME APPLY OVER NAIL AND SURROUNDING SKIN, APPLY DAILY OVER PREVIOUS COAT, AFTER 7 DAYS MAY REMOVE WITH ALCOHOL AND REPEAT    Cymbalta 20 MG capsule Generic drug: DULoxetine Take 1 capsule (20 mg total) by mouth daily.    diphenhydramine-acetaminophen 25-500 MG Tabs tablet Commonly known as: TYLENOL PM Take 1 tablet by mouth at bedtime.    docusate sodium 100 MG capsule Commonly known as: COLACE Take 100 mg by mouth daily.    ezetimibe 10 MG tablet Commonly known as: ZETIA Take 1 tablet (10 mg total) by mouth daily.    finasteride 5 MG tablet Commonly known as: PROSCAR TAKE 1 TABLET(5 MG) BY MOUTH DAILY    fluocinolone 0.025 % cream Commonly known as: SYNALAR Apply topically 2 (two) times daily as needed.    furosemide 40 MG tablet Commonly known as: LASIX Take 1 tablet (40 mg total) by mouth daily.    gabapentin 300 MG capsule Commonly known as: NEURONTIN TAKE 2 CAPSULE BY MOUTH AT BEDTIME WITH EXTRA CAPSULE DURING THE DAY AS NEEDED    hydrocortisone 2.5 % cream APPLY TO FACE AT BEDTIME TUES, THURS, SAT    ketoconazole 2 % shampoo Commonly known as: NIZORAL APPLY TOPICALLY 3 TIMES WEEKLY AS DIRECTED    ketoconazole 2 % cream Commonly known as: NIZORAL APPLY TO FACE AT BEDTIME MON, WED, FRI    levothyroxine 175 MCG tablet Commonly known as: SYNTHROID Take 1 tablet (175 mcg total) by mouth daily before breakfast.    lidocaine 4 % Place 1 patch onto the skin daily. OTC    Linzess 290 MCG Caps capsule Generic drug: linaclotide TAKE 1 CAPSULE(290 MCG) BY MOUTH DAILY BEFORE BREAKFAST    Melatonin 10  MG Caps Take 10 mg by mouth at bedtime.    meloxicam 7.5 MG tablet Commonly known as: MOBIC Take 7.5 mg by mouth daily.    metoprolol succinate 50 MG 24 hr tablet Commonly known as: TOPROL-XL Take 1 tablet (50 mg total) by mouth daily.    mometasone 0.1 % cream Commonly known as: ELOCON Apply 1 Application topically every Monday, Wednesday, and Friday. bedtime    nitroGLYCERIN 0.4 MG SL tablet Commonly known as: NITROSTAT Place 1 tablet (0.4 mg total) under the tongue every 5 (five) minutes as needed for chest pain.    ondansetron 4 MG tablet Commonly known as: ZOFRAN Take 4 mg by mouth every 6 (six) hours as needed for nausea.    Oxycodone HCl 10 MG Tabs Take 1 tablet (10 mg total) by  mouth 2 (two) times daily as needed (moderate pain).    polyethylene glycol 17 g packet Commonly known as: MIRALAX / GLYCOLAX Take 17 g by mouth daily.    Praluent 75 MG/ML Soaj Generic drug: Alirocumab ADMINISTER 75MG  UNDER THE SKIN EVERY 14 DAYS    PRESERVISION AREDS 2 PO Take 1 tablet by mouth daily.    CENTRUM SILVER PO Take 1 tablet by mouth daily.    Protonix 40 MG tablet Generic drug: pantoprazole Take 1 tablet (40 mg total) by mouth daily.    senna 8.6 MG tablet Commonly known as: SENOKOT Take 1 tablet (8.6 mg total) by mouth daily.    sucralfate 1 g tablet Commonly known as: CARAFATE Take 1 g by mouth 2 (two) times daily.    tamsulosin 0.4 MG Caps capsule Commonly known as: FLOMAX Take 1 capsule (0.4 mg total) by mouth daily.    tizanidine 2 MG capsule Commonly known as: ZANAFLEX Take 2 mg by mouth 2 (two) times daily as needed for muscle spasms.    Vascepa 1 g capsule Generic drug: icosapent Ethyl Take 2 capsules (2 g total) by mouth 2 (two) times daily.    Vitamin D (Cholecalciferol) 25 MCG (1000 UT) Tabs Take 1 tablet by mouth daily.        Relevant Imaging Results:  Relevant Lab Results:   Additional Information SS# [id number]  Chapman Fitch, RN

## 2023-08-21 ENCOUNTER — Other Ambulatory Visit: Payer: Self-pay | Admitting: Internal Medicine

## 2023-08-21 DIAGNOSIS — Z9989 Dependence on other enabling machines and devices: Secondary | ICD-10-CM | POA: Diagnosis not present

## 2023-08-21 DIAGNOSIS — Z9181 History of falling: Secondary | ICD-10-CM | POA: Diagnosis not present

## 2023-08-21 DIAGNOSIS — N183 Chronic kidney disease, stage 3 unspecified: Secondary | ICD-10-CM | POA: Diagnosis not present

## 2023-08-21 DIAGNOSIS — F0280 Dementia in other diseases classified elsewhere without behavioral disturbance: Secondary | ICD-10-CM | POA: Diagnosis not present

## 2023-08-21 DIAGNOSIS — M545 Low back pain, unspecified: Secondary | ICD-10-CM | POA: Diagnosis not present

## 2023-08-21 DIAGNOSIS — G9341 Metabolic encephalopathy: Secondary | ICD-10-CM | POA: Diagnosis not present

## 2023-08-21 DIAGNOSIS — I130 Hypertensive heart and chronic kidney disease with heart failure and stage 1 through stage 4 chronic kidney disease, or unspecified chronic kidney disease: Secondary | ICD-10-CM | POA: Diagnosis not present

## 2023-08-21 DIAGNOSIS — I2510 Atherosclerotic heart disease of native coronary artery without angina pectoris: Secondary | ICD-10-CM | POA: Diagnosis not present

## 2023-08-21 DIAGNOSIS — N138 Other obstructive and reflux uropathy: Secondary | ICD-10-CM | POA: Diagnosis not present

## 2023-08-21 DIAGNOSIS — E039 Hypothyroidism, unspecified: Secondary | ICD-10-CM | POA: Diagnosis not present

## 2023-08-21 DIAGNOSIS — I4892 Unspecified atrial flutter: Secondary | ICD-10-CM | POA: Diagnosis not present

## 2023-08-21 DIAGNOSIS — I5030 Unspecified diastolic (congestive) heart failure: Secondary | ICD-10-CM | POA: Diagnosis not present

## 2023-08-21 DIAGNOSIS — E785 Hyperlipidemia, unspecified: Secondary | ICD-10-CM | POA: Diagnosis not present

## 2023-08-21 DIAGNOSIS — N401 Enlarged prostate with lower urinary tract symptoms: Secondary | ICD-10-CM | POA: Diagnosis not present

## 2023-08-21 DIAGNOSIS — N390 Urinary tract infection, site not specified: Secondary | ICD-10-CM | POA: Diagnosis not present

## 2023-08-21 DIAGNOSIS — Z9682 Presence of neurostimulator: Secondary | ICD-10-CM | POA: Diagnosis not present

## 2023-08-21 DIAGNOSIS — G4733 Obstructive sleep apnea (adult) (pediatric): Secondary | ICD-10-CM | POA: Diagnosis not present

## 2023-08-21 DIAGNOSIS — G894 Chronic pain syndrome: Secondary | ICD-10-CM | POA: Diagnosis not present

## 2023-08-23 LAB — CULTURE, BLOOD (ROUTINE X 2)
Culture: NO GROWTH
Culture: NO GROWTH
Special Requests: ADEQUATE
Special Requests: ADEQUATE

## 2023-08-24 DIAGNOSIS — N183 Chronic kidney disease, stage 3 unspecified: Secondary | ICD-10-CM | POA: Diagnosis not present

## 2023-08-24 DIAGNOSIS — I5030 Unspecified diastolic (congestive) heart failure: Secondary | ICD-10-CM | POA: Diagnosis not present

## 2023-08-24 DIAGNOSIS — G9341 Metabolic encephalopathy: Secondary | ICD-10-CM | POA: Diagnosis not present

## 2023-08-24 DIAGNOSIS — I130 Hypertensive heart and chronic kidney disease with heart failure and stage 1 through stage 4 chronic kidney disease, or unspecified chronic kidney disease: Secondary | ICD-10-CM | POA: Diagnosis not present

## 2023-08-24 DIAGNOSIS — E039 Hypothyroidism, unspecified: Secondary | ICD-10-CM | POA: Diagnosis not present

## 2023-08-24 DIAGNOSIS — N390 Urinary tract infection, site not specified: Secondary | ICD-10-CM | POA: Diagnosis not present

## 2023-08-27 ENCOUNTER — Encounter: Payer: Self-pay | Admitting: Family Medicine

## 2023-08-27 NOTE — Telephone Encounter (Signed)
LVM for patient to r/s

## 2023-08-28 ENCOUNTER — Telehealth: Payer: Self-pay | Admitting: Family Medicine

## 2023-08-28 DIAGNOSIS — N390 Urinary tract infection, site not specified: Secondary | ICD-10-CM | POA: Diagnosis not present

## 2023-08-28 DIAGNOSIS — N183 Chronic kidney disease, stage 3 unspecified: Secondary | ICD-10-CM | POA: Diagnosis not present

## 2023-08-28 DIAGNOSIS — I5030 Unspecified diastolic (congestive) heart failure: Secondary | ICD-10-CM | POA: Diagnosis not present

## 2023-08-28 DIAGNOSIS — I130 Hypertensive heart and chronic kidney disease with heart failure and stage 1 through stage 4 chronic kidney disease, or unspecified chronic kidney disease: Secondary | ICD-10-CM | POA: Diagnosis not present

## 2023-08-28 DIAGNOSIS — E039 Hypothyroidism, unspecified: Secondary | ICD-10-CM | POA: Diagnosis not present

## 2023-08-28 DIAGNOSIS — G9341 Metabolic encephalopathy: Secondary | ICD-10-CM | POA: Diagnosis not present

## 2023-08-28 NOTE — Telephone Encounter (Signed)
 Pt called in and stated that he wanted to know why his appt was canceled on th 3rd I informed the pt that he has to do his hospital follow up with his provider not another provider pt also had two other appt and canceled them because pt stated he did not make those appt so I tried to give him other appts and pt stated he dont a ride so I ended up giving him a appt on 1/14

## 2023-08-29 DIAGNOSIS — I5030 Unspecified diastolic (congestive) heart failure: Secondary | ICD-10-CM | POA: Diagnosis not present

## 2023-08-29 DIAGNOSIS — G9341 Metabolic encephalopathy: Secondary | ICD-10-CM | POA: Diagnosis not present

## 2023-08-29 DIAGNOSIS — E039 Hypothyroidism, unspecified: Secondary | ICD-10-CM | POA: Diagnosis not present

## 2023-08-29 DIAGNOSIS — N390 Urinary tract infection, site not specified: Secondary | ICD-10-CM | POA: Diagnosis not present

## 2023-08-29 DIAGNOSIS — I130 Hypertensive heart and chronic kidney disease with heart failure and stage 1 through stage 4 chronic kidney disease, or unspecified chronic kidney disease: Secondary | ICD-10-CM | POA: Diagnosis not present

## 2023-08-29 DIAGNOSIS — N183 Chronic kidney disease, stage 3 unspecified: Secondary | ICD-10-CM | POA: Diagnosis not present

## 2023-08-29 LAB — BLOOD GAS, VENOUS
Acid-Base Excess: 3.2 mmol/L — ABNORMAL HIGH (ref 0.0–2.0)
Bicarbonate: 27.9 mmol/L (ref 20.0–28.0)
O2 Saturation: 47.9 %
Patient temperature: 37
pCO2, Ven: 42 mm[Hg] — ABNORMAL LOW (ref 44–60)
pH, Ven: 7.43 (ref 7.25–7.43)

## 2023-08-30 ENCOUNTER — Inpatient Hospital Stay: Payer: Medicare Other | Admitting: Family Medicine

## 2023-08-30 NOTE — Telephone Encounter (Signed)
 Appt has been cancelled.

## 2023-08-30 NOTE — Telephone Encounter (Signed)
 Called patient he will be here for appointment tomorrow at 2. Do I need to cancel appointment for later?

## 2023-08-30 NOTE — Telephone Encounter (Addendum)
 See mychart message - pt doesn't have transportation for tomorrow. Appt has been cancelled.

## 2023-08-30 NOTE — Telephone Encounter (Signed)
 Can we clarify this? He had appt with me for 08/31/2023 (I still have a block on my schedule with his name on it). Does he not have transportation for tomorrow appt?

## 2023-08-30 NOTE — Telephone Encounter (Signed)
 Can you guys please cancel this appointment? It would not let me do it.

## 2023-08-31 ENCOUNTER — Inpatient Hospital Stay: Payer: Medicare Other | Admitting: Family Medicine

## 2023-08-31 DIAGNOSIS — I130 Hypertensive heart and chronic kidney disease with heart failure and stage 1 through stage 4 chronic kidney disease, or unspecified chronic kidney disease: Secondary | ICD-10-CM | POA: Diagnosis not present

## 2023-08-31 DIAGNOSIS — E039 Hypothyroidism, unspecified: Secondary | ICD-10-CM | POA: Diagnosis not present

## 2023-08-31 DIAGNOSIS — N183 Chronic kidney disease, stage 3 unspecified: Secondary | ICD-10-CM | POA: Diagnosis not present

## 2023-08-31 DIAGNOSIS — G9341 Metabolic encephalopathy: Secondary | ICD-10-CM | POA: Diagnosis not present

## 2023-08-31 DIAGNOSIS — I5030 Unspecified diastolic (congestive) heart failure: Secondary | ICD-10-CM | POA: Diagnosis not present

## 2023-08-31 DIAGNOSIS — N390 Urinary tract infection, site not specified: Secondary | ICD-10-CM | POA: Diagnosis not present

## 2023-08-31 NOTE — Telephone Encounter (Signed)
 Spoke to Dr. Sherrie Mustache, POA for his father He can bring his father in for his appointment on 1.7.25 at 2pm.

## 2023-08-31 NOTE — Telephone Encounter (Signed)
 Also documented in telephone note dated 12.31.24  Spoke to Dr. Sherrie Mustache, POA for his father He can bring his father in for his appointment on 1.7.25 at 2pm.

## 2023-09-03 ENCOUNTER — Telehealth: Payer: Self-pay

## 2023-09-03 ENCOUNTER — Ambulatory Visit: Payer: Medicare Other | Admitting: Family Medicine

## 2023-09-03 DIAGNOSIS — E039 Hypothyroidism, unspecified: Secondary | ICD-10-CM | POA: Diagnosis not present

## 2023-09-03 DIAGNOSIS — G9341 Metabolic encephalopathy: Secondary | ICD-10-CM | POA: Diagnosis not present

## 2023-09-03 DIAGNOSIS — I130 Hypertensive heart and chronic kidney disease with heart failure and stage 1 through stage 4 chronic kidney disease, or unspecified chronic kidney disease: Secondary | ICD-10-CM | POA: Diagnosis not present

## 2023-09-03 DIAGNOSIS — N183 Chronic kidney disease, stage 3 unspecified: Secondary | ICD-10-CM | POA: Diagnosis not present

## 2023-09-03 DIAGNOSIS — I5030 Unspecified diastolic (congestive) heart failure: Secondary | ICD-10-CM | POA: Diagnosis not present

## 2023-09-03 DIAGNOSIS — N390 Urinary tract infection, site not specified: Secondary | ICD-10-CM | POA: Diagnosis not present

## 2023-09-03 NOTE — Telephone Encounter (Signed)
 Faxed filled out [facility] form, and placed in scan.

## 2023-09-04 ENCOUNTER — Encounter: Payer: Self-pay | Admitting: Family Medicine

## 2023-09-04 ENCOUNTER — Ambulatory Visit (INDEPENDENT_AMBULATORY_CARE_PROVIDER_SITE_OTHER): Payer: Medicare Other | Admitting: Family Medicine

## 2023-09-04 VITALS — BP 132/82 | HR 76 | Temp 98.5°F | Ht 70.0 in | Wt 279.4 lb

## 2023-09-04 DIAGNOSIS — N2889 Other specified disorders of kidney and ureter: Secondary | ICD-10-CM

## 2023-09-04 DIAGNOSIS — G720 Drug-induced myopathy: Secondary | ICD-10-CM | POA: Diagnosis not present

## 2023-09-04 DIAGNOSIS — I5032 Chronic diastolic (congestive) heart failure: Secondary | ICD-10-CM

## 2023-09-04 DIAGNOSIS — N1831 Chronic kidney disease, stage 3a: Secondary | ICD-10-CM

## 2023-09-04 DIAGNOSIS — N3289 Other specified disorders of bladder: Secondary | ICD-10-CM

## 2023-09-04 DIAGNOSIS — Z8673 Personal history of transient ischemic attack (TIA), and cerebral infarction without residual deficits: Secondary | ICD-10-CM | POA: Diagnosis not present

## 2023-09-04 DIAGNOSIS — N3000 Acute cystitis without hematuria: Secondary | ICD-10-CM | POA: Diagnosis not present

## 2023-09-04 DIAGNOSIS — R4182 Altered mental status, unspecified: Secondary | ICD-10-CM

## 2023-09-04 DIAGNOSIS — F01B0 Vascular dementia, moderate, without behavioral disturbance, psychotic disturbance, mood disturbance, and anxiety: Secondary | ICD-10-CM | POA: Diagnosis not present

## 2023-09-04 DIAGNOSIS — F3340 Major depressive disorder, recurrent, in remission, unspecified: Secondary | ICD-10-CM | POA: Diagnosis not present

## 2023-09-04 DIAGNOSIS — I6523 Occlusion and stenosis of bilateral carotid arteries: Secondary | ICD-10-CM

## 2023-09-04 DIAGNOSIS — E782 Mixed hyperlipidemia: Secondary | ICD-10-CM

## 2023-09-04 LAB — BASIC METABOLIC PANEL
BUN: 25 mg/dL — ABNORMAL HIGH (ref 6–23)
CO2: 32 meq/L (ref 19–32)
Calcium: 10.4 mg/dL (ref 8.4–10.5)
Chloride: 99 meq/L (ref 96–112)
Creatinine, Ser: 1.36 mg/dL (ref 0.40–1.50)
GFR: 47.32 mL/min — ABNORMAL LOW (ref >60.00)
Glucose, Bld: 135 mg/dL — ABNORMAL HIGH (ref 70–99)
Potassium: 4.1 meq/L (ref 3.5–5.1)
Sodium: 138 meq/L (ref 135–145)

## 2023-09-04 LAB — MAGNESIUM: Magnesium: 2 mg/dL (ref 1.5–2.5)

## 2023-09-04 NOTE — Patient Instructions (Addendum)
 We will refer you back to Dr Apolinar Junes urology.  Will await [facility] evaluation later this month Labs today (BMP).  Good to see you today. Continue current medicines.

## 2023-09-04 NOTE — Progress Notes (Signed)
 Ph: [phone] Fax: [phone]   Patient ID: Anthony [name], male    DOB: 1936/02/14, 88 y.o.   MRN: [id number]  This visit was conducted in person.  BP 132/82   Pulse 76   Temp 98.5 F (36.9 C) (Oral)   Ht 5' 10 (1.778 m)   Wt 279 lb 6 oz (126.7 kg)   SpO2 94%   BMI 40.09 kg/m    CC: hospital f/u visit  Subjective:   HPI: Anthony [name] is a 88 y.o. male presenting on 09/04/2023 for Hospitalization Follow-up (Admitted on 08/17/23 at [facility], dx altered mental status; fall; B athlete's foot. Pt accompanied by son, Anthony [name]. )   Recent hospitalization for altered mental status when found on floor at his ALF [facility] on 08/17/2023. This in setting of increased urinary frequency, urgency, concern for UTI so started on omnicef . UCx and blood cultures returned no growth, but drawn after antibiotics were started.  Hospital records reviewed. Med rec performed.  RSV, COVID, flu negative.  MRI brain without acute process. Did show chronic small cortical/subcortical infarct to anterior L frontal lobe (MCA territory) along with background moderate cerebral white matter chronic small vessel ischemic disease and generalized parenchymal atrophy.  CT abd pelvis and CT spine checked, 2.2cm mass over R kidney thought hemorrhagic cyst, consider rpt imaging in 6 months. Possible focal anterior bladder wall thickening rec uro protocol CT for further evaluation.  Carotid US  showed calcified plaque <50% stenosis to RICA origin, moderate noncalcified plaque LICA <50% stenosis.   Since home, denies significant urinary symptoms including no dysuria, urgency, frequency, lower abd discomfort, fevers, significant nocturia.   Worsened memory /cognitive impairment since hospitalization 05/2022 - anticipate above noted stroke occurred at that time.  Son also notes possible worsening memory since starting Toprol  XL.   Continues praluent , zetia , Vascepa  - some trouble getting Vascepa  filled.  Lab Results   Component Value Date   CHOL 186 08/10/2023   HDL 38.00 (L) 08/10/2023   LDLCALC 24 10/06/2019   LDLDIRECT 63.0 08/10/2023   TRIG (H) 08/10/2023    446.0 Triglyceride is over 400; calculations on Lipids are invalid.   CHOLHDL 5 08/10/2023    Urology - saw Dr Penne last year after hospitalization 05/2022 complicated by urinary retention s/p foley placement then removal a few eeks later. Son requests return to uro for eval.   PT recommended SNF, family decided return to ALF. Receiving PT at ALF.  Other follow up appointments scheduled: [facility] 09/18/2023 - planning to f/u noted carotid stenosis.  ______________________________________________________________________ Hospital admission: 08/17/2023 Hospital discharge: 08/20/2023 TCM f/u phone call: not performed   Discharge Diagnoses: Principal Problem:   AMS (altered mental status) Active Problems:   OSA on CPAP   BPH with urinary obstruction   History of atrial flutter   (HFpEF) heart failure with preserved ejection fraction (HCC)   Essential hypertension   Hypothyroidism   Severe obesity (BMI 35.0-39.9) with comorbidity (HCC)   CAD (coronary artery disease)   CKD (chronic kidney disease) stage 3, GFR 30-59 ml/min (HCC)   Chronic pain syndrome   Acute metabolic encephalopathy   UTI (urinary tract infection)   Dementia (HCC)  Recommendations for Outpatient Follow-up:  Follow up with PCP Anthony Baller, MD in 1-2 weeks Please obtain labs/tests: CBC, BMP. Consider CTA head/neck vs vascular surgery follow up for mild carotid stenosis  Please follow up on the following pending results: none     Relevant past medical, surgical, family and social history  reviewed and updated as indicated. Interim medical history since our last visit reviewed. Allergies and medications reviewed and updated. Outpatient Medications Prior to Visit  Medication Sig Dispense Refill   acetaminophen  (TYLENOL ) 500 MG tablet Take 1 tablet (500 mg total) by  mouth 2 (two) times daily as needed for moderate pain.     Alirocumab  (PRALUENT ) 75 MG/ML SOAJ ADMINISTER 75MG  UNDER THE SKIN EVERY 14 DAYS 6 mL 3   aspirin  EC 81 MG tablet Take 1 tablet (81 mg total) by mouth daily.     ciclopirox  (PENLAC ) 8 % solution APPLY TOPICALLY AT BEDTIME APPLY OVER NAIL AND SURROUNDING SKIN, APPLY DAILY OVER PREVIOUS COAT, AFTER 7 DAYS MAY REMOVE WITH ALCOHOL AND REPEAT 6.6 mL 11   Cyanocobalamin  (B-12) 1000 MCG SUBL Place 1 tablet under the tongue daily.     diphenhydramine -acetaminophen  (TYLENOL  PM) 25-500 MG TABS tablet Take 1 tablet by mouth at bedtime.     docusate sodium  (COLACE) 100 MG capsule Take 100 mg by mouth daily.     DULoxetine  (CYMBALTA ) 20 MG capsule Take 1 capsule (20 mg total) by mouth daily.     ezetimibe  (ZETIA ) 10 MG tablet Take 1 tablet (10 mg total) by mouth daily. 90 tablet 3   finasteride  (PROSCAR ) 5 MG tablet TAKE 1 TABLET(5 MG) BY MOUTH DAILY     fluocinolone (SYNALAR) 0.025 % cream Apply topically 2 (two) times daily as needed.     furosemide  (LASIX ) 40 MG tablet Take 1 tablet (40 mg total) by mouth daily.     gabapentin  (NEURONTIN ) 300 MG capsule TAKE 2 CAPSULE BY MOUTH AT BEDTIME WITH EXTRA CAPSULE DURING THE DAY AS NEEDED     hydrocortisone  2.5 % cream APPLY TO FACE AT BEDTIME TUES, THURS, SAT 30 g 6   ketoconazole  (NIZORAL ) 2 % cream APPLY TO FACE AT BEDTIME MON, WED, FRI 60 g 3   ketoconazole  (NIZORAL ) 2 % shampoo APPLY TOPICALLY 3 TIMES WEEKLY AS DIRECTED 120 mL 2   levothyroxine  (SYNTHROID ) 175 MCG tablet Take 1 tablet (175 mcg total) by mouth daily before breakfast. 90 tablet 1   lidocaine  4 % Place 1 patch onto the skin daily. OTC     LINZESS  290 MCG CAPS capsule TAKE 1 CAPSULE(290 MCG) BY MOUTH DAILY BEFORE BREAKFAST 90 capsule 1   Melatonin 10 MG CAPS Take 10 mg by mouth at bedtime.     meloxicam  (MOBIC ) 7.5 MG tablet Take 7.5 mg by mouth daily.     metoprolol  succinate (TOPROL -XL) 50 MG 24 hr tablet Take 1 tablet (50 mg total) by  mouth daily.     mometasone  (ELOCON ) 0.1 % cream Apply 1 Application topically every Monday, Wednesday, and Friday. bedtime     Multiple Vitamins-Minerals (CENTRUM SILVER PO) Take 1 tablet by mouth daily.     Multiple Vitamins-Minerals (PRESERVISION AREDS 2 PO) Take 1 tablet by mouth daily.     nitroGLYCERIN  (NITROSTAT ) 0.4 MG SL tablet Place 1 tablet (0.4 mg total) under the tongue every 5 (five) minutes as needed for chest pain. 25 tablet 0   ondansetron  (ZOFRAN ) 4 MG tablet Take 4 mg by mouth every 6 (six) hours as needed for nausea.     Oxycodone  HCl 10 MG TABS Take 1 tablet (10 mg total) by mouth 2 (two) times daily as needed (moderate pain). 30 tablet 0   pantoprazole  (PROTONIX ) 40 MG tablet Take 1 tablet (40 mg total) by mouth daily.     polyethylene glycol (MIRALAX  / GLYCOLAX ) 17  g packet Take 17 g by mouth daily.     senna (SENOKOT) 8.6 MG tablet Take 1 tablet (8.6 mg total) by mouth daily. 30 tablet 3   sucralfate  (CARAFATE ) 1 g tablet Take 1 g by mouth 2 (two) times daily.     tamsulosin  (FLOMAX ) 0.4 MG CAPS capsule Take 1 capsule (0.4 mg total) by mouth daily.     tizanidine  (ZANAFLEX ) 2 MG capsule Take 2 mg by mouth 2 (two) times daily as needed for muscle spasms.     VASCEPA  1 g capsule TAKE 2 CAPSULES(2 GRAMS) BY MOUTH TWICE DAILY 120 capsule 0   Vitamin D , Cholecalciferol , 25 MCG (1000 UT) TABS Take 1 tablet by mouth daily.     No facility-administered medications prior to visit.     Per HPI unless specifically indicated in ROS section below Review of Systems  Objective:  BP 132/82   Pulse 76   Temp 98.5 F (36.9 C) (Oral)   Ht 5' 10 (1.778 m)   Wt 279 lb 6 oz (126.7 kg)   SpO2 94%   BMI 40.09 kg/m   Wt Readings from Last 3 Encounters:  09/04/23 279 lb 6 oz (126.7 kg)  08/20/23 278 lb (126.1 kg)  08/10/23 278 lb (126.1 kg)      Physical Exam Vitals and nursing note reviewed.  Constitutional:      Appearance: Normal appearance. He is not ill-appearing.      Comments: Ambulates with walker  HENT:     Head: Normocephalic and atraumatic.     Mouth/Throat:     Mouth: Mucous membranes are moist.     Pharynx: Oropharynx is clear. No oropharyngeal exudate or posterior oropharyngeal erythema.  Eyes:     Extraocular Movements: Extraocular movements intact.     Pupils: Pupils are equal, round, and reactive to light.  Cardiovascular:     Rate and Rhythm: Normal rate and regular rhythm.     Pulses: Normal pulses.     Heart sounds: Normal heart sounds. No murmur heard. Pulmonary:     Effort: Pulmonary effort is normal. No respiratory distress.     Breath sounds: Normal breath sounds. No wheezing, rhonchi or rales.  Abdominal:     General: Bowel sounds are normal. There is no distension.     Palpations: Abdomen is soft. There is no mass.     Tenderness: There is no abdominal tenderness. There is no rebound.     Hernia: No hernia is present.  Musculoskeletal:     Cervical back: Normal range of motion and neck supple.     Right lower leg: No edema.     Left lower leg: No edema.  Skin:    General: Skin is warm and dry.     Findings: No rash.  Neurological:     Mental Status: He is alert.  Psychiatric:        Mood and Affect: Mood normal.        Behavior: Behavior normal.       Results for orders placed or performed in visit on 09/04/23  Basic metabolic panel   Collection Time: 09/04/23  2:35 PM  Result Value Ref Range   Sodium 138 135 - 145 mEq/L   Potassium 4.1 3.5 - 5.1 mEq/L   Chloride 99 96 - 112 mEq/L   CO2 32 19 - 32 mEq/L   Glucose, Bld 135 (H) 70 - 99 mg/dL   BUN 25 (H) 6 - 23 mg/dL   Creatinine, Ser  1.36 0.40 - 1.50 mg/dL   GFR 52.67 (L) >39.99 mL/min   Calcium 10.4 8.4 - 10.5 mg/dL  Magnesium    Collection Time: 09/04/23  2:35 PM  Result Value Ref Range   Magnesium  2.0 1.5 - 2.5 mg/dL   Lab Results  Component Value Date   HGBA1C 5.8 08/30/2022    Assessment & Plan:   Problem List Items Addressed This Visit      Hypercholesterolemia with hypertriglyceridemia   Chronic, predominant hypertriglyceridemia. Continues Praluent  and ezetimibe . Some trouble filling Vasepa. No changes at this time.  H/o statin intolerance - myopathy.       (HFpEF) heart failure with preserved ejection fraction (HCC)   Seems euvolemic today.       CKD (chronic kidney disease) stage 3, GFR 30-59 ml/min (HCC)   Update renal panel.       Relevant Orders   Basic metabolic panel (Completed)   Magnesium  (Completed)   Right kidney mass   2.2cm R kidney mass noted on CT abd/pelvis 07/2023 thought hemorrhagic stroke - to consider rpt imaging 6 months  Await urology eval.       Relevant Orders   Ambulatory referral to Urology   Recurrent major depression in remission (HCC)   Continue low dose Cymbalta       Vascular dementia (HCC)   Reviewed MRI brain from 07/2023 - evidence of chronic small L frontal lobe infarct.  Will continue working towards BP, sugar and cholesterol control. Continues aspirin  81mg  daily. Statin intolerance.  Consider cholinesterase inhibitor therapy if progressive cognitive decline noted without recurrent stroke.       Statin myopathy   UTI (urinary tract infection)   This has been treated with omnicef  without recurrent UTI symptoms.       Relevant Orders   Ambulatory referral to Urology   AMS (altered mental status) - Primary   Episode of altered mental status at [facility] attributed to UTI - treated with Omnicef  with interval improvement.  Now back to baseline.  Update labs today.       Relevant Orders   Ambulatory referral to Urology   Carotid stenosis   Carotid US  07/2023 showed calcified plaque <50% stenosis to RICA origin, moderate noncalcified plaque LICA <50% stenosis.  Evidence of chronic stroke to L MCA territory. Already on aspirin  81mg  daily  Will await [facility] evaluation.       History of stroke   Old MCA territory stroke to L frontal lobe noted on recent MRI.  Continue  aspirin  81mg  daily.      Bladder wall thickening   CT abd/pelvis 07/2023 - possible focal anterior bladder wall thickening - to consider uro protocol CT for further evaluation.  Await urology evaluation.       Relevant Orders   Ambulatory referral to Urology     No orders of the defined types were placed in this encounter.   Orders Placed This Encounter  Procedures   Basic metabolic panel   Magnesium    Ambulatory referral to Urology    Referral Priority:   Routine    Referral Type:   Consultation    Referral Reason:   Specialty Services Required    Requested Specialty:   Urology    Number of Visits Requested:   1    Patient Instructions  We will refer you back to Dr Penne urology.  Will await [facility] evaluation later this month Labs today (BMP).  Good to see you today. Continue current medicines.   Follow up  plan: No follow-ups on file.  Anton Blas, MD

## 2023-09-09 DIAGNOSIS — N3289 Other specified disorders of bladder: Secondary | ICD-10-CM | POA: Insufficient documentation

## 2023-09-09 DIAGNOSIS — I6529 Occlusion and stenosis of unspecified carotid artery: Secondary | ICD-10-CM | POA: Insufficient documentation

## 2023-09-09 DIAGNOSIS — Z8673 Personal history of transient ischemic attack (TIA), and cerebral infarction without residual deficits: Secondary | ICD-10-CM | POA: Insufficient documentation

## 2023-09-09 NOTE — Assessment & Plan Note (Addendum)
 Episode of altered mental status at [facility] ALF attributed to UTI - treated with [facility] with interval improvement.  Now back to baseline.  Update labs today.

## 2023-09-09 NOTE — Assessment & Plan Note (Signed)
Seems euvolemic today.  

## 2023-09-09 NOTE — Assessment & Plan Note (Addendum)
 2.2cm R kidney mass noted on CT abd/pelvis 07/2023 thought hemorrhagic stroke - to consider rpt imaging 6 months  Await urology eval.

## 2023-09-09 NOTE — Assessment & Plan Note (Addendum)
 Carotid US 07/2023 showed calcified plaque <50% stenosis to RICA origin, moderate noncalcified plaque LICA <50% stenosis.  Evidence of chronic stroke to L MCA territory. Already on aspirin 81mg  daily  Will await [facility] evaluation.

## 2023-09-09 NOTE — Assessment & Plan Note (Signed)
Update renal panel 

## 2023-09-09 NOTE — Assessment & Plan Note (Addendum)
 Reviewed MRI brain from 07/2023 - evidence of chronic small L frontal lobe infarct.  Will continue working towards BP, sugar and cholesterol control. Continues aspirin  81mg  daily. Statin intolerance.  Consider cholinesterase inhibitor therapy if progressive cognitive decline noted without recurrent stroke.

## 2023-09-09 NOTE — Assessment & Plan Note (Addendum)
 Chronic, predominant hypertriglyceridemia. Continues Praluent and ezetimibe. Some trouble filling Vasepa. No changes at this time.  H/o statin intolerance - myopathy.

## 2023-09-09 NOTE — Assessment & Plan Note (Signed)
 This has been treated with omnicef without recurrent UTI symptoms.

## 2023-09-09 NOTE — Assessment & Plan Note (Signed)
 Old MCA territory stroke to L frontal lobe noted on recent MRI.  Continue aspirin 81mg  daily.

## 2023-09-09 NOTE — Addendum Note (Signed)
 Addended by: Eustaquio Boyden on: 09/09/2023 09:30 PM   Modules accepted: Level of Service

## 2023-09-09 NOTE — Assessment & Plan Note (Signed)
 CT abd/pelvis 07/2023 - possible focal anterior bladder wall thickening - to consider uro protocol CT for further evaluation.  Await urology evaluation.

## 2023-09-09 NOTE — Assessment & Plan Note (Signed)
 Continue low dose Cymbalta

## 2023-09-10 DIAGNOSIS — G9341 Metabolic encephalopathy: Secondary | ICD-10-CM | POA: Diagnosis not present

## 2023-09-10 DIAGNOSIS — E039 Hypothyroidism, unspecified: Secondary | ICD-10-CM | POA: Diagnosis not present

## 2023-09-10 DIAGNOSIS — I5030 Unspecified diastolic (congestive) heart failure: Secondary | ICD-10-CM | POA: Diagnosis not present

## 2023-09-10 DIAGNOSIS — N390 Urinary tract infection, site not specified: Secondary | ICD-10-CM | POA: Diagnosis not present

## 2023-09-10 DIAGNOSIS — I130 Hypertensive heart and chronic kidney disease with heart failure and stage 1 through stage 4 chronic kidney disease, or unspecified chronic kidney disease: Secondary | ICD-10-CM | POA: Diagnosis not present

## 2023-09-10 DIAGNOSIS — N183 Chronic kidney disease, stage 3 unspecified: Secondary | ICD-10-CM | POA: Diagnosis not present

## 2023-09-11 ENCOUNTER — Inpatient Hospital Stay: Payer: Medicare Other | Admitting: Family Medicine

## 2023-09-13 ENCOUNTER — Ambulatory Visit (INDEPENDENT_AMBULATORY_CARE_PROVIDER_SITE_OTHER): Payer: Medicare Other | Admitting: Urology

## 2023-09-13 VITALS — Ht 74.0 in | Wt 275.0 lb

## 2023-09-13 DIAGNOSIS — I4892 Unspecified atrial flutter: Secondary | ICD-10-CM | POA: Diagnosis not present

## 2023-09-13 DIAGNOSIS — G9341 Metabolic encephalopathy: Secondary | ICD-10-CM | POA: Diagnosis not present

## 2023-09-13 DIAGNOSIS — N2889 Other specified disorders of kidney and ureter: Secondary | ICD-10-CM

## 2023-09-13 DIAGNOSIS — I5030 Unspecified diastolic (congestive) heart failure: Secondary | ICD-10-CM | POA: Diagnosis not present

## 2023-09-13 DIAGNOSIS — F0280 Dementia in other diseases classified elsewhere without behavioral disturbance: Secondary | ICD-10-CM | POA: Diagnosis not present

## 2023-09-13 DIAGNOSIS — R339 Retention of urine, unspecified: Secondary | ICD-10-CM

## 2023-09-13 DIAGNOSIS — N401 Enlarged prostate with lower urinary tract symptoms: Secondary | ICD-10-CM

## 2023-09-13 DIAGNOSIS — Z87440 Personal history of urinary (tract) infections: Secondary | ICD-10-CM

## 2023-09-13 DIAGNOSIS — G894 Chronic pain syndrome: Secondary | ICD-10-CM | POA: Diagnosis not present

## 2023-09-13 DIAGNOSIS — Z87898 Personal history of other specified conditions: Secondary | ICD-10-CM | POA: Diagnosis not present

## 2023-09-13 DIAGNOSIS — N3289 Other specified disorders of bladder: Secondary | ICD-10-CM | POA: Diagnosis not present

## 2023-09-13 DIAGNOSIS — I130 Hypertensive heart and chronic kidney disease with heart failure and stage 1 through stage 4 chronic kidney disease, or unspecified chronic kidney disease: Secondary | ICD-10-CM | POA: Diagnosis not present

## 2023-09-13 DIAGNOSIS — N390 Urinary tract infection, site not specified: Secondary | ICD-10-CM | POA: Diagnosis not present

## 2023-09-13 DIAGNOSIS — N183 Chronic kidney disease, stage 3 unspecified: Secondary | ICD-10-CM | POA: Diagnosis not present

## 2023-09-13 DIAGNOSIS — N138 Other obstructive and reflux uropathy: Secondary | ICD-10-CM | POA: Diagnosis not present

## 2023-09-13 DIAGNOSIS — I2510 Atherosclerotic heart disease of native coronary artery without angina pectoris: Secondary | ICD-10-CM | POA: Diagnosis not present

## 2023-09-13 DIAGNOSIS — E039 Hypothyroidism, unspecified: Secondary | ICD-10-CM | POA: Diagnosis not present

## 2023-09-13 LAB — BLADDER SCAN AMB NON-IMAGING

## 2023-09-13 NOTE — Progress Notes (Signed)
   09/13/2023 2:39 PM   Anthony [name] March 16, 1936 [id number]  Reason for visit: Follow up BPH, history of urinary retention, history of UTI, renal lesion, bladder wall thickening  HPI: 88 year old male here today with his son who is a primary care physician locally.  He was actually seen by Dr. Apolinar Junes in October 2023 for urinary retention and passed a voiding trial at that time.  He was hospitalized in December 2024 for confusion and felt to possibly have a UTI.  Hospitalization notes were reviewed extensively.  Urinalysis that time showed 6-10 RBC, 20-50 WBC, rare bacteria, trace leukocytes, nitrite negative, culture was ultimately negative.  I personally viewed and interpreted the CT abdomen and pelvis with contrast from that hospitalization that shows decompressed bladder with no hydronephrosis, stable renal cysts including a 2.2 cm lesion over the right kidney that likely represents a hemorrhagic cyst, stable from prior CT imaging in 2021, very subtle possible bladder wall thickening.  He denies any urinary complaints today.  PVR is normal at 2ml.   We reviewed his imaging, and reassurance was provided regarding the small right renal lesion that is most likely benign based on lack of any significant enhancement with contrast, and stability over the last 5 years.  The bladder wall thickening is very subtle.  We discussed that further evaluation could be performed with cystoscopy or voided cytology, but cystoscopy comes with risks of bleeding, infection, or recurrence of urinary retention.  Return precautions were discussed including gross hematuria, worsening urinary symptoms or retention.  Using shared decision making we opted for 39-month follow-up with repeat urinalysis and PVR, if significant microscopic hematuria could consider cystoscopy or voided cytology at that time.  Continue Flomax and finasteride RTC 6 months UA, PVR   Sondra Come, MD  [facility] [address] [place], [place] [address] [phone]

## 2023-09-17 DIAGNOSIS — N183 Chronic kidney disease, stage 3 unspecified: Secondary | ICD-10-CM | POA: Diagnosis not present

## 2023-09-17 DIAGNOSIS — I130 Hypertensive heart and chronic kidney disease with heart failure and stage 1 through stage 4 chronic kidney disease, or unspecified chronic kidney disease: Secondary | ICD-10-CM | POA: Diagnosis not present

## 2023-09-17 DIAGNOSIS — G9341 Metabolic encephalopathy: Secondary | ICD-10-CM | POA: Diagnosis not present

## 2023-09-17 DIAGNOSIS — N390 Urinary tract infection, site not specified: Secondary | ICD-10-CM | POA: Diagnosis not present

## 2023-09-17 DIAGNOSIS — I5030 Unspecified diastolic (congestive) heart failure: Secondary | ICD-10-CM | POA: Diagnosis not present

## 2023-09-17 DIAGNOSIS — E039 Hypothyroidism, unspecified: Secondary | ICD-10-CM | POA: Diagnosis not present

## 2023-09-18 ENCOUNTER — Encounter (INDEPENDENT_AMBULATORY_CARE_PROVIDER_SITE_OTHER): Payer: Self-pay | Admitting: Nurse Practitioner

## 2023-09-18 ENCOUNTER — Ambulatory Visit (INDEPENDENT_AMBULATORY_CARE_PROVIDER_SITE_OTHER): Payer: Medicare Other | Admitting: Nurse Practitioner

## 2023-09-18 VITALS — BP 132/79 | HR 69 | Ht 74.0 in | Wt 281.6 lb

## 2023-09-18 DIAGNOSIS — I10 Essential (primary) hypertension: Secondary | ICD-10-CM | POA: Diagnosis not present

## 2023-09-18 DIAGNOSIS — I6523 Occlusion and stenosis of bilateral carotid arteries: Secondary | ICD-10-CM

## 2023-09-18 DIAGNOSIS — E782 Mixed hyperlipidemia: Secondary | ICD-10-CM

## 2023-09-18 NOTE — Progress Notes (Signed)
Subjective:    Patient ID: Anthony [name], male    DOB: 25-Mar-1936, 88 y.o.   MRN: [id number] No chief complaint on file.   The patient is an 88 year old male who presents today for evaluation of carotid stenosis.  The patient presented to the hospital due to altered mental status at his nursing facility.  In the midst of workup the patient had MRI which showed an old infarct.  Additionally in the midst of workup the patient underwent bilateral carotid ultrasound which showed less than 50% stenosis bilaterally but there was concern for possible shadowing which may indicate possible higher level of stenosis.  Currently the patient has not had any recent CVA or TIA-like symptoms.  Currently there is no amaurosis fugax.    Review of Systems  Musculoskeletal:  Positive for gait problem.  All other systems reviewed and are negative.      Objective:   Physical Exam Vitals reviewed.  HENT:     Head: Normocephalic.  Neck:     Vascular: No carotid bruit.  Cardiovascular:     Rate and Rhythm: Normal rate and regular rhythm.     Pulses: Normal pulses.  Pulmonary:     Effort: Pulmonary effort is normal.  Skin:    General: Skin is warm and dry.  Neurological:     Mental Status: He is alert and oriented to person, place, and time.  Psychiatric:        Mood and Affect: Mood normal.        Behavior: Behavior normal.        Thought Content: Thought content normal.        Judgment: Judgment normal.     BP 132/79   Pulse 69   Ht 6\' 2"  (1.88 m)   Wt 281 lb 9.6 oz (127.7 kg)   BMI 36.16 kg/m   Past Medical History:  Diagnosis Date   Abscess in epidural space of lumbar spine 06/05/2022   Anosmia 1980s   Atrial flutter (HCC) 2018   had an ablation with dr. Tollie Pizza   Basal cell carcinoma 01/11/2022   L cheek - excised 04/25/22   CAD (coronary artery disease)    Cancer (HCC)    skin cancer   Chronic constipation    Chronic insomnia    Chronic lower back pain    s/p spine  stimulator placement   Coag negative Staphylococcus bacteremia    Complication of anesthesia    when under general, he woke up agitated and unable to be held down   Diastolic CHF (HCC)    Discitis of lumbosacral region    History of diverticulitis 2017   History of pneumonia 2013   Hyperlipidemia    Hypertension    Hypothyroidism    Irregular heart rhythm 07/29/2022   Obesity, Class II, BMI 35-39.9, with comorbidity    OSA on CPAP    Peptic ulcer disease 07/29/2022   PONV (postoperative nausea and vomiting)    Vertebral osteomyelitis (HCC)     Social History   Socioeconomic History   Marital status: Widowed    Spouse name: Not on file   Number of children: Not on file   Years of education: Not on file   Highest education level: Not on file  Occupational History   Not on file  Tobacco Use   Smoking status: Never    Passive exposure: Never   Smokeless tobacco: Never  Vaping Use   Vaping status: Never Used  Substance and Sexual  Activity   Alcohol use: No    Comment: QUIT DRINKING   Drug use: No   Sexual activity: Not Currently  Other Topics Concern   Not on file  Social History Narrative   Widower (wife passed 08/2014).    Lives alone, GF Miss Yehuda Mao sometimes (she was nurse)   Son is Dr Mila Merry (FP at [facility]). Other son is a Careers adviser in [place]    Occ: retired, used to [employer] store   Edu: HS      Non-smoking; no alcohol. Lives in [place]; self.       Social Drivers of Corporate investment banker Strain: Low Risk  (04/19/2023)   Overall Financial Resource Strain (CARDIA)    Difficulty of Paying Living Expenses: Not hard at all  Food Insecurity: No Food Insecurity (08/18/2023)   Hunger Vital Sign    Worried About Running Out of Food in the Last Year: Never true    Ran Out of Food in the Last Year: Never true  Transportation Needs: No Transportation Needs (04/19/2023)   PRAPARE - Administrator, Civil Service (Medical): No     Lack of Transportation (Non-Medical): No  Physical Activity: Insufficiently Active (04/19/2023)   Exercise Vital Sign    Days of Exercise per Week: 7 days    Minutes of Exercise per Session: 10 min  Stress: No Stress Concern Present (04/19/2023)   [employer] of Occupational Health - Occupational Stress Questionnaire    Feeling of Stress : Not at all  Social Connections: Socially Isolated (04/19/2023)   Social Connection and Isolation Panel [NHANES]    Frequency of Communication with Friends and Family: More than three times a week    Frequency of Social Gatherings with Friends and Family: Once a week    Attends Religious Services: Never    Database administrator or Organizations: No    Attends Banker Meetings: Never    Marital Status: Widowed  Intimate Partner Violence: Not At Risk (04/19/2023)   Humiliation, Afraid, Rape, and Kick questionnaire    Fear of Current or Ex-Partner: No    Emotionally Abused: No    Physically Abused: No    Sexually Abused: No    Past Surgical History:  Procedure Laterality Date   A-FLUTTER ABLATION N/A 05/07/2017   Procedure: A-Flutter Ablation;  Surgeon: Duke Salvia, MD;  Location: [facility] INVASIVE CV LAB;  Service: Cardiovascular;  Laterality: N/A;   CARDIOVASCULAR STRESS TEST  11/2017   no ischemia, low risk study   COLONOSCOPY  2007   normal per prior PCP records, rpt 10 yrs (Dr Rosealee Albee)   ELBOW SURGERY Right    ulnar nerve decompression.  PT DOES NOT RECALL THIS PROCEDURE   ENDOVASCULAR REPAIR/STENT GRAFT N/A 05/05/2020   Procedure: ENDOVASCULAR REPAIR/STENT GRAFT;  Surgeon: Annice Needy, MD;  Location: [facility] INVASIVE CV LAB;  Service: Cardiovascular;  Laterality: N/A;   ESOPHAGOGASTRODUODENOSCOPY (EGD) WITH PROPOFOL N/A 05/30/2022   Procedure: ESOPHAGOGASTRODUODENOSCOPY (EGD) WITH PROPOFOL;  Surgeon: Wyline Mood, MD;  Location: [facility] ENDOSCOPY;  Service: Gastroenterology;  Laterality: N/A;   LAMINECTOMY THORACIC SPINE W/  PLACEMENT SPINAL CORD STIMULATOR  08/2018   and removal of prior spine stimulator (Dr Adriana Simas)   LUMBAR LAMINECTOMY FOR EPIDURAL ABSCESS Bilateral 06/05/2022   Procedure: LUMBAR 2- L3, L5-S1 LAMINECTOMY FOR EPIDURAL ABSCESS;  Surgeon: Venetia Night, MD;  Location: [facility] ORS;  Service: Neurosurgery;  Laterality: Bilateral;   NASAL SINUS SURGERY  nasal polyps. done a long time ago   PERCUTANEOUS CORONARY STENT INTERVENTION (PCI-S)  2013   EF55%, 70% mid LAD, 99% mid RCA, mild MR, elev LVEDP, DES to mid LAD. RCA is nondominant   RADIOFREQUENCY ABLATION  06/2017   lumbar region [facility])    REPLACEMENT TOTAL KNEE BILATERAL Bilateral 2000s   SPINAL CORD STIMULATOR IMPLANT  06/2016   SPINAL CORD STIMULATOR INSERTION N/A 09/09/2018   Procedure: LUMBAR SPINAL CORD STIMULATOR LEAD AND BATTERY REMOVAL AND LAMINECTOMY FOR PLACEMENT OF PADDLE;  Surgeon: Lucy Chris, MD;  Location: [facility] ORS;  Service: Neurosurgery;  Laterality: N/A;   SPINAL CORD STIMULATOR INSERTION N/A 09/16/2018   Procedure: PLACEMENT OF SPINAL CORD STIMULATOR BATTERY;  Surgeon: Lucy Chris, MD;  Location: [facility] ORS;  Service: Neurosurgery;  Laterality: N/A;   SPINAL CORD STIMULATOR REMOVAL N/A 06/03/2022   Procedure: LUMBAR SPINAL CORD STIMULATOR REMOVAL;  Surgeon: Venetia Night, MD;  Location: [facility] ORS;  Service: Neurosurgery;  Laterality: N/A;    Family History  Problem Relation Age of Onset   Stroke Mother    Kidney disease Father    Leukemia Brother    Stroke Sister    Diabetes Neg Hx     Allergies  Allergen Reactions   Questran [Cholestyramine]     Patient not aware of an allergy to this medicine.   Atorvastatin     Muscle pain   Gemfibrozil Other (See Comments)    Muscle pain.   Metformin And Related     Dizziness   Rosuvastatin     Muscle pain       Latest Ref Rng & Units 08/20/2023    5:14 AM 08/19/2023    8:36 AM 08/18/2023    5:29 AM  CBC  WBC 4.0 - 10.5 K/uL 7.9  9.0  11.5   Hemoglobin  13.0 - 17.0 g/dL 55.7  32.2  02.5   Hematocrit 39.0 - 52.0 % 41.6  38.8  41.0   Platelets 150 - 400 K/uL 251  234  233       CMP     Component Value Date/Time   NA 138 09/04/2023 1435   NA 139 05/03/2017 1037   K 4.1 09/04/2023 1435   CL 99 09/04/2023 1435   CO2 32 09/04/2023 1435   GLUCOSE 135 (H) 09/04/2023 1435   BUN 25 (H) 09/04/2023 1435   BUN 23 05/03/2017 1037   CREATININE 1.36 09/04/2023 1435   CALCIUM 10.4 09/04/2023 1435   PROT 7.3 08/18/2023 1932   ALBUMIN 3.9 08/18/2023 1932   AST 28 08/18/2023 1932   ALT 14 08/18/2023 1932   ALKPHOS 68 08/18/2023 1932   BILITOT 1.0 08/18/2023 1932   GFR 47.32 (L) 09/04/2023 1435   GFRNONAA 51 (L) 08/20/2023 0514     No results found.     Assessment & Plan:   1. Bilateral carotid artery stenosis (Primary) Recommend:  The patient had a carotid duplex which shows less than 50% stenosis of the ICA however, the calcific nature may induce shadowing and underestimate the level of stenosis present.  Given that the patient had a previous CVA this can certainly be a cause that is just not been also it fully visualized.  I am sorry.?  Patient should undergo CT angiography of the carotid arteries to define the degree of stenosis of the internal carotid arteries bilaterally and the anatomic suitability for surgery.  If the patient does indeed need surgery cardiac clearance will be required, once cleared the patient will  be scheduled for surgery.  The risks, benefits and alternative therapies were reviewed in detail with the patient.  All questions were answered.  The patient agrees to proceed with imaging.  Continue antiplatelet therapy as prescribed. Continue management of CAD, HTN and Hyperlipidemia. Healthy heart diet, encouraged exercise at least 4 times per week.  - CT ANGIO NECK W OR WO CONTRAST; Future  2. Essential hypertension Continue antihypertensive medications as already ordered, these medications have been reviewed  and there are no changes at this time.  3. Hypercholesterolemia with hypertriglyceridemia Continue statin as ordered and reviewed, no changes at this time   Current Outpatient Medications on File Prior to Visit  Medication Sig Dispense Refill   acetaminophen (TYLENOL) 500 MG tablet Take 1 tablet (500 mg total) by mouth 2 (two) times daily as needed for moderate pain.     Alirocumab (PRALUENT) 75 MG/ML SOAJ ADMINISTER 75MG  UNDER THE SKIN EVERY 14 DAYS 6 mL 3   aspirin EC 81 MG tablet Take 1 tablet (81 mg total) by mouth daily.     ciclopirox (PENLAC) 8 % solution APPLY TOPICALLY AT BEDTIME APPLY OVER NAIL AND SURROUNDING SKIN, APPLY DAILY OVER PREVIOUS COAT, AFTER 7 DAYS MAY REMOVE WITH ALCOHOL AND REPEAT 6.6 mL 11   Cyanocobalamin (B-12) 1000 MCG SUBL Place 1 tablet under the tongue daily.     diphenhydramine-acetaminophen (TYLENOL PM) 25-500 MG TABS tablet Take 1 tablet by mouth at bedtime.     docusate sodium (COLACE) 100 MG capsule Take 100 mg by mouth daily.     DULoxetine (CYMBALTA) 20 MG capsule Take 1 capsule (20 mg total) by mouth daily.     ezetimibe (ZETIA) 10 MG tablet Take 1 tablet (10 mg total) by mouth daily. 90 tablet 3   finasteride (PROSCAR) 5 MG tablet TAKE 1 TABLET(5 MG) BY MOUTH DAILY     fluocinolone (SYNALAR) 0.025 % cream Apply topically 2 (two) times daily as needed.     furosemide (LASIX) 40 MG tablet Take 1 tablet (40 mg total) by mouth daily.     gabapentin (NEURONTIN) 300 MG capsule TAKE 2 CAPSULE BY MOUTH AT BEDTIME WITH EXTRA CAPSULE DURING THE DAY AS NEEDED     hydrocortisone 2.5 % cream APPLY TO FACE AT BEDTIME TUES, THURS, SAT 30 g 6   ketoconazole (NIZORAL) 2 % cream APPLY TO FACE AT BEDTIME MON, WED, FRI 60 g 3   ketoconazole (NIZORAL) 2 % shampoo APPLY TOPICALLY 3 TIMES WEEKLY AS DIRECTED 120 mL 2   levothyroxine (SYNTHROID) 175 MCG tablet Take 1 tablet (175 mcg total) by mouth daily before breakfast. 90 tablet 1   lidocaine 4 % Place 1 patch onto the skin  daily. OTC     LINZESS 290 MCG CAPS capsule TAKE 1 CAPSULE(290 MCG) BY MOUTH DAILY BEFORE BREAKFAST 90 capsule 1   Melatonin 10 MG CAPS Take 10 mg by mouth at bedtime.     meloxicam (MOBIC) 7.5 MG tablet Take 7.5 mg by mouth daily.     metoprolol succinate (TOPROL-XL) 50 MG 24 hr tablet Take 1 tablet (50 mg total) by mouth daily.     mometasone (ELOCON) 0.1 % cream Apply 1 Application topically every Monday, Wednesday, and Friday. bedtime     Multiple Vitamins-Minerals (CENTRUM SILVER PO) Take 1 tablet by mouth daily.     Multiple Vitamins-Minerals (PRESERVISION AREDS 2 PO) Take 1 tablet by mouth daily.     nitroGLYCERIN (NITROSTAT) 0.4 MG SL tablet Place 1 tablet (0.4 mg  total) under the tongue every 5 (five) minutes as needed for chest pain. 25 tablet 0   ondansetron (ZOFRAN) 4 MG tablet Take 4 mg by mouth every 6 (six) hours as needed for nausea.     Oxycodone HCl 10 MG TABS Take 1 tablet (10 mg total) by mouth 2 (two) times daily as needed (moderate pain). 30 tablet 0   pantoprazole (PROTONIX) 40 MG tablet Take 1 tablet (40 mg total) by mouth daily.     polyethylene glycol (MIRALAX / GLYCOLAX) 17 g packet Take 17 g by mouth daily.     senna (SENOKOT) 8.6 MG tablet Take 1 tablet (8.6 mg total) by mouth daily. 30 tablet 3   sucralfate (CARAFATE) 1 g tablet Take 1 g by mouth 2 (two) times daily.     tamsulosin (FLOMAX) 0.4 MG CAPS capsule Take 1 capsule (0.4 mg total) by mouth daily.     tizanidine (ZANAFLEX) 2 MG capsule Take 2 mg by mouth 2 (two) times daily as needed for muscle spasms.     VASCEPA 1 g capsule TAKE 2 CAPSULES(2 GRAMS) BY MOUTH TWICE DAILY 120 capsule 0   Vitamin D, Cholecalciferol, 25 MCG (1000 UT) TABS Take 1 tablet by mouth daily.     No current facility-administered medications on file prior to visit.    There are no Patient Instructions on file for this visit. No follow-ups on file.   Georgiana Spinner, NP

## 2023-09-20 DIAGNOSIS — G4733 Obstructive sleep apnea (adult) (pediatric): Secondary | ICD-10-CM | POA: Diagnosis not present

## 2023-09-20 DIAGNOSIS — Z9682 Presence of neurostimulator: Secondary | ICD-10-CM | POA: Diagnosis not present

## 2023-09-20 DIAGNOSIS — E785 Hyperlipidemia, unspecified: Secondary | ICD-10-CM | POA: Diagnosis not present

## 2023-09-20 DIAGNOSIS — I5030 Unspecified diastolic (congestive) heart failure: Secondary | ICD-10-CM | POA: Diagnosis not present

## 2023-09-20 DIAGNOSIS — I2510 Atherosclerotic heart disease of native coronary artery without angina pectoris: Secondary | ICD-10-CM | POA: Diagnosis not present

## 2023-09-20 DIAGNOSIS — N138 Other obstructive and reflux uropathy: Secondary | ICD-10-CM | POA: Diagnosis not present

## 2023-09-20 DIAGNOSIS — G894 Chronic pain syndrome: Secondary | ICD-10-CM | POA: Diagnosis not present

## 2023-09-20 DIAGNOSIS — N390 Urinary tract infection, site not specified: Secondary | ICD-10-CM | POA: Diagnosis not present

## 2023-09-20 DIAGNOSIS — E039 Hypothyroidism, unspecified: Secondary | ICD-10-CM | POA: Diagnosis not present

## 2023-09-20 DIAGNOSIS — I4892 Unspecified atrial flutter: Secondary | ICD-10-CM | POA: Diagnosis not present

## 2023-09-20 DIAGNOSIS — N183 Chronic kidney disease, stage 3 unspecified: Secondary | ICD-10-CM | POA: Diagnosis not present

## 2023-09-20 DIAGNOSIS — F0280 Dementia in other diseases classified elsewhere without behavioral disturbance: Secondary | ICD-10-CM | POA: Diagnosis not present

## 2023-09-20 DIAGNOSIS — G9341 Metabolic encephalopathy: Secondary | ICD-10-CM | POA: Diagnosis not present

## 2023-09-20 DIAGNOSIS — I130 Hypertensive heart and chronic kidney disease with heart failure and stage 1 through stage 4 chronic kidney disease, or unspecified chronic kidney disease: Secondary | ICD-10-CM | POA: Diagnosis not present

## 2023-09-20 DIAGNOSIS — Z9989 Dependence on other enabling machines and devices: Secondary | ICD-10-CM | POA: Diagnosis not present

## 2023-09-20 DIAGNOSIS — N401 Enlarged prostate with lower urinary tract symptoms: Secondary | ICD-10-CM | POA: Diagnosis not present

## 2023-09-20 DIAGNOSIS — M545 Low back pain, unspecified: Secondary | ICD-10-CM | POA: Diagnosis not present

## 2023-09-20 DIAGNOSIS — Z9181 History of falling: Secondary | ICD-10-CM | POA: Diagnosis not present

## 2023-09-24 DIAGNOSIS — I5030 Unspecified diastolic (congestive) heart failure: Secondary | ICD-10-CM | POA: Diagnosis not present

## 2023-09-24 DIAGNOSIS — N390 Urinary tract infection, site not specified: Secondary | ICD-10-CM | POA: Diagnosis not present

## 2023-09-24 DIAGNOSIS — N183 Chronic kidney disease, stage 3 unspecified: Secondary | ICD-10-CM | POA: Diagnosis not present

## 2023-09-24 DIAGNOSIS — E039 Hypothyroidism, unspecified: Secondary | ICD-10-CM | POA: Diagnosis not present

## 2023-09-24 DIAGNOSIS — I130 Hypertensive heart and chronic kidney disease with heart failure and stage 1 through stage 4 chronic kidney disease, or unspecified chronic kidney disease: Secondary | ICD-10-CM | POA: Diagnosis not present

## 2023-09-24 DIAGNOSIS — G9341 Metabolic encephalopathy: Secondary | ICD-10-CM | POA: Diagnosis not present

## 2023-09-25 ENCOUNTER — Ambulatory Visit
Admission: RE | Admit: 2023-09-25 | Discharge: 2023-09-25 | Disposition: A | Discharge status: Home / Self Care | Payer: Medicare Other | Source: Ambulatory Visit | Attending: Nurse Practitioner | Admitting: Nurse Practitioner

## 2023-09-25 DIAGNOSIS — I700 Atherosclerosis of aorta: Secondary | ICD-10-CM | POA: Diagnosis not present

## 2023-09-25 DIAGNOSIS — I6523 Occlusion and stenosis of bilateral carotid arteries: Secondary | ICD-10-CM | POA: Insufficient documentation

## 2023-09-25 MED ORDER — IOHEXOL 350 MG/ML SOLN
75.0000 mL | Freq: Once | INTRAVENOUS | Status: AC | PRN
  Administered 2023-09-25: 75 mL via INTRAVENOUS

## 2023-10-01 DIAGNOSIS — I130 Hypertensive heart and chronic kidney disease with heart failure and stage 1 through stage 4 chronic kidney disease, or unspecified chronic kidney disease: Secondary | ICD-10-CM | POA: Diagnosis not present

## 2023-10-01 DIAGNOSIS — I5030 Unspecified diastolic (congestive) heart failure: Secondary | ICD-10-CM | POA: Diagnosis not present

## 2023-10-01 DIAGNOSIS — E039 Hypothyroidism, unspecified: Secondary | ICD-10-CM | POA: Diagnosis not present

## 2023-10-01 DIAGNOSIS — N183 Chronic kidney disease, stage 3 unspecified: Secondary | ICD-10-CM | POA: Diagnosis not present

## 2023-10-01 DIAGNOSIS — G9341 Metabolic encephalopathy: Secondary | ICD-10-CM | POA: Diagnosis not present

## 2023-10-01 DIAGNOSIS — N390 Urinary tract infection, site not specified: Secondary | ICD-10-CM | POA: Diagnosis not present

## 2023-10-02 ENCOUNTER — Encounter (INDEPENDENT_AMBULATORY_CARE_PROVIDER_SITE_OTHER): Payer: Self-pay

## 2023-10-08 DIAGNOSIS — I130 Hypertensive heart and chronic kidney disease with heart failure and stage 1 through stage 4 chronic kidney disease, or unspecified chronic kidney disease: Secondary | ICD-10-CM | POA: Diagnosis not present

## 2023-10-08 DIAGNOSIS — E039 Hypothyroidism, unspecified: Secondary | ICD-10-CM | POA: Diagnosis not present

## 2023-10-08 DIAGNOSIS — G9341 Metabolic encephalopathy: Secondary | ICD-10-CM | POA: Diagnosis not present

## 2023-10-08 DIAGNOSIS — N390 Urinary tract infection, site not specified: Secondary | ICD-10-CM | POA: Diagnosis not present

## 2023-10-08 DIAGNOSIS — N183 Chronic kidney disease, stage 3 unspecified: Secondary | ICD-10-CM | POA: Diagnosis not present

## 2023-10-08 DIAGNOSIS — I5030 Unspecified diastolic (congestive) heart failure: Secondary | ICD-10-CM | POA: Diagnosis not present

## 2023-10-10 ENCOUNTER — Other Ambulatory Visit: Payer: Self-pay | Admitting: Internal Medicine

## 2023-10-17 DIAGNOSIS — I5030 Unspecified diastolic (congestive) heart failure: Secondary | ICD-10-CM | POA: Diagnosis not present

## 2023-10-17 DIAGNOSIS — I130 Hypertensive heart and chronic kidney disease with heart failure and stage 1 through stage 4 chronic kidney disease, or unspecified chronic kidney disease: Secondary | ICD-10-CM | POA: Diagnosis not present

## 2023-10-17 DIAGNOSIS — N183 Chronic kidney disease, stage 3 unspecified: Secondary | ICD-10-CM | POA: Diagnosis not present

## 2023-10-17 DIAGNOSIS — E039 Hypothyroidism, unspecified: Secondary | ICD-10-CM | POA: Diagnosis not present

## 2023-10-17 DIAGNOSIS — N390 Urinary tract infection, site not specified: Secondary | ICD-10-CM | POA: Diagnosis not present

## 2023-10-17 DIAGNOSIS — G9341 Metabolic encephalopathy: Secondary | ICD-10-CM | POA: Diagnosis not present

## 2023-10-23 ENCOUNTER — Encounter (INDEPENDENT_AMBULATORY_CARE_PROVIDER_SITE_OTHER): Payer: Self-pay | Admitting: Vascular Surgery

## 2023-10-23 ENCOUNTER — Ambulatory Visit (INDEPENDENT_AMBULATORY_CARE_PROVIDER_SITE_OTHER): Payer: Medicare Other | Admitting: Vascular Surgery

## 2023-10-23 VITALS — BP 194/98 | HR 61 | Resp 18 | Ht 74.0 in | Wt 270.0 lb

## 2023-10-23 DIAGNOSIS — I7143 Infrarenal abdominal aortic aneurysm, without rupture: Secondary | ICD-10-CM

## 2023-10-23 DIAGNOSIS — I10 Essential (primary) hypertension: Secondary | ICD-10-CM | POA: Diagnosis not present

## 2023-10-23 DIAGNOSIS — I6523 Occlusion and stenosis of bilateral carotid arteries: Secondary | ICD-10-CM | POA: Diagnosis not present

## 2023-10-23 MED ORDER — CLOPIDOGREL BISULFATE 75 MG PO TABS: 75.0000 mg | ORAL_TABLET | Freq: Every day | ORAL | 6 refills | Status: AC

## 2023-10-23 NOTE — Assessment & Plan Note (Signed)
 Status post endovascular repair of the abdominal aortic aneurysm about 3-1/2 years ago.  Scheduled for his follow-up duplex in a few months.

## 2023-10-23 NOTE — Assessment & Plan Note (Signed)
 I have independently reviewed his CT angiogram of the carotid arteries performed last month.  The official report is of a 47% right ICA stenosis and calcific plaque with no stenosis in the left carotid artery.  I think this is a reasonable estimate as I had it had roughly 50% ICA stenosis on the right by my interpretation and 20 to 25% stenosis on the left.  This would be below the threshold for consideration for intervention.  I would recommend given his previous history of embolic phenomenon dual antiplatelet therapy for this moderate right carotid stenosis.  A new prescription for Plavix can be sent in today.  He has a scheduled follow-up visit in several months for his aneurysm, and we will add a carotid duplex at that time for follow-up.

## 2023-10-23 NOTE — Progress Notes (Signed)
 MRN : [id number]  Anthony [name] is a 88 y.o. (1936/01/02) male who presents with chief complaint of  Chief Complaint  Patient presents with   Follow-up    FU CT results  .  History of Present Illness: Patient returns today in follow up of his carotid disease.  Since his last visit he has undergone a CT angiogram of the neck which I have independently reviewed.  He has not had any further episodes of focal neurologic ischemia. Specifically, the patient denies amaurosis fugax, speech or swallowing difficulties, or arm or leg weakness or numbness.  When he was admitted with a UTI and some delirium back in December, he did have some more focal findings such as expressive aphasia. I have independently reviewed his CT angiogram of the carotid arteries performed last month.  The official report is of a 47% right ICA stenosis and calcific plaque with no stenosis in the left carotid artery.  I think this is a reasonable estimate as I had it had roughly 50% ICA stenosis on the right by my interpretation and 20 to 25% stenosis on the left.    Current Outpatient Medications  Medication Sig Dispense Refill   acetaminophen (TYLENOL) 500 MG tablet Take 1 tablet (500 mg total) by mouth 2 (two) times daily as needed for moderate pain.     Alirocumab (PRALUENT) 75 MG/ML SOAJ ADMINISTER 75MG  UNDER THE SKIN EVERY 14 DAYS 6 mL 3   aspirin EC 81 MG tablet Take 1 tablet (81 mg total) by mouth daily.     ciclopirox (PENLAC) 8 % solution APPLY TOPICALLY AT BEDTIME APPLY OVER NAIL AND SURROUNDING SKIN, APPLY DAILY OVER PREVIOUS COAT, AFTER 7 DAYS MAY REMOVE WITH ALCOHOL AND REPEAT 6.6 mL 11   clopidogrel (PLAVIX) 75 MG tablet Take 1 tablet (75 mg total) by mouth daily. 30 tablet 6   Cyanocobalamin (B-12) 1000 MCG SUBL Place 1 tablet under the tongue daily.     diphenhydramine-acetaminophen (TYLENOL PM) 25-500 MG TABS tablet Take 1 tablet by mouth at bedtime.     docusate sodium (COLACE) 100 MG capsule Take 100 mg by  mouth daily.     DULoxetine (CYMBALTA) 20 MG capsule Take 1 capsule (20 mg total) by mouth daily.     ezetimibe (ZETIA) 10 MG tablet Take 1 tablet (10 mg total) by mouth daily. 90 tablet 3   finasteride (PROSCAR) 5 MG tablet TAKE 1 TABLET(5 MG) BY MOUTH DAILY     fluocinolone (SYNALAR) 0.025 % cream Apply topically 2 (two) times daily as needed.     furosemide (LASIX) 40 MG tablet Take 1 tablet (40 mg total) by mouth daily.     gabapentin (NEURONTIN) 300 MG capsule TAKE 2 CAPSULE BY MOUTH AT BEDTIME WITH EXTRA CAPSULE DURING THE DAY AS NEEDED     hydrocortisone 2.5 % cream APPLY TO FACE AT BEDTIME TUES, THURS, SAT 30 g 6   ketoconazole (NIZORAL) 2 % cream APPLY TO FACE AT BEDTIME MON, WED, FRI 60 g 3   ketoconazole (NIZORAL) 2 % shampoo APPLY TOPICALLY 3 TIMES WEEKLY AS DIRECTED 120 mL 2   levothyroxine (SYNTHROID) 175 MCG tablet Take 1 tablet (175 mcg total) by mouth daily before breakfast. 90 tablet 1   lidocaine 4 % Place 1 patch onto the skin daily. OTC     LINZESS 290 MCG CAPS capsule TAKE 1 CAPSULE(290 MCG) BY MOUTH DAILY BEFORE BREAKFAST 90 capsule 1   Melatonin 10 MG CAPS Take 10 mg by  mouth at bedtime.     meloxicam (MOBIC) 7.5 MG tablet Take 7.5 mg by mouth daily.     metoprolol succinate (TOPROL-XL) 50 MG 24 hr tablet Take 1 tablet (50 mg total) by mouth daily.     mometasone (ELOCON) 0.1 % cream Apply 1 Application topically every Monday, Wednesday, and Friday. bedtime     Multiple Vitamins-Minerals (CENTRUM SILVER PO) Take 1 tablet by mouth daily.     Multiple Vitamins-Minerals (PRESERVISION AREDS 2 PO) Take 1 tablet by mouth daily.     nitroGLYCERIN (NITROSTAT) 0.4 MG SL tablet Place 1 tablet (0.4 mg total) under the tongue every 5 (five) minutes as needed for chest pain. 25 tablet 0   ondansetron (ZOFRAN) 4 MG tablet Take 4 mg by mouth every 6 (six) hours as needed for nausea.     Oxycodone HCl 10 MG TABS Take 1 tablet (10 mg total) by mouth 2 (two) times daily as needed (moderate  pain). 30 tablet 0   pantoprazole (PROTONIX) 40 MG tablet Take 1 tablet (40 mg total) by mouth daily.     polyethylene glycol (MIRALAX / GLYCOLAX) 17 g packet Take 17 g by mouth daily.     senna (SENOKOT) 8.6 MG tablet Take 1 tablet (8.6 mg total) by mouth daily. 30 tablet 3   sucralfate (CARAFATE) 1 g tablet Take 1 g by mouth 2 (two) times daily.     tamsulosin (FLOMAX) 0.4 MG CAPS capsule Take 1 capsule (0.4 mg total) by mouth daily.     tizanidine (ZANAFLEX) 2 MG capsule Take 2 mg by mouth 2 (two) times daily as needed for muscle spasms.     VASCEPA 1 g capsule TAKE 2 CAPSULES(2 GRAMS) BY MOUTH TWICE DAILY 120 capsule 0   Vitamin D, Cholecalciferol, 25 MCG (1000 UT) TABS Take 1 tablet by mouth daily.     No current facility-administered medications for this visit.    Past Medical History:  Diagnosis Date   Abscess in epidural space of lumbar spine 06/05/2022   Anosmia 1980s   Atrial flutter (HCC) 2018   had an ablation with dr. Tollie Pizza   Basal cell carcinoma 01/11/2022   L cheek - excised 04/25/22   CAD (coronary artery disease)    Cancer (HCC)    skin cancer   Chronic constipation    Chronic insomnia    Chronic lower back pain    s/p spine stimulator placement   Coag negative Staphylococcus bacteremia    Complication of anesthesia    when under general, he woke up agitated and unable to be held down   Diastolic CHF (HCC)    Discitis of lumbosacral region    History of diverticulitis 2017   History of pneumonia 2013   Hyperlipidemia    Hypertension    Hypothyroidism    Irregular heart rhythm 07/29/2022   Obesity, Class II, BMI 35-39.9, with comorbidity    OSA on CPAP    Peptic ulcer disease 07/29/2022   PONV (postoperative nausea and vomiting)    Vertebral osteomyelitis (HCC)     Past Surgical History:  Procedure Laterality Date   A-FLUTTER ABLATION N/A 05/07/2017   Procedure: A-Flutter Ablation;  Surgeon: Duke Salvia, MD;  Location: [facility] INVASIVE CV LAB;  Service:  Cardiovascular;  Laterality: N/A;   CARDIOVASCULAR STRESS TEST  11/2017   no ischemia, low risk study   COLONOSCOPY  2007   normal per prior PCP records, rpt 10 yrs (Dr Rosealee Albee)   ELBOW SURGERY Right  ulnar nerve decompression.  PT DOES NOT RECALL THIS PROCEDURE   ENDOVASCULAR REPAIR/STENT GRAFT N/A 05/05/2020   Procedure: ENDOVASCULAR REPAIR/STENT GRAFT;  Surgeon: Annice Needy, MD;  Location: [facility] INVASIVE CV LAB;  Service: Cardiovascular;  Laterality: N/A;   ESOPHAGOGASTRODUODENOSCOPY (EGD) WITH PROPOFOL N/A 05/30/2022   Procedure: ESOPHAGOGASTRODUODENOSCOPY (EGD) WITH PROPOFOL;  Surgeon: Wyline Mood, MD;  Location: [facility] ENDOSCOPY;  Service: Gastroenterology;  Laterality: N/A;   LAMINECTOMY THORACIC SPINE W/ PLACEMENT SPINAL CORD STIMULATOR  08/2018   and removal of prior spine stimulator (Dr Adriana Simas)   LUMBAR LAMINECTOMY FOR EPIDURAL ABSCESS Bilateral 06/05/2022   Procedure: LUMBAR 2- L3, L5-S1 LAMINECTOMY FOR EPIDURAL ABSCESS;  Surgeon: Venetia Night, MD;  Location: [facility] ORS;  Service: Neurosurgery;  Laterality: Bilateral;   NASAL SINUS SURGERY     nasal polyps. done a long time ago   PERCUTANEOUS CORONARY STENT INTERVENTION (PCI-S)  2013   EF55%, 70% mid LAD, 99% mid RCA, mild MR, elev LVEDP, DES to mid LAD. RCA is nondominant   RADIOFREQUENCY ABLATION  06/2017   lumbar region [facility])    REPLACEMENT TOTAL KNEE BILATERAL Bilateral 2000s   SPINAL CORD STIMULATOR IMPLANT  06/2016   SPINAL CORD STIMULATOR INSERTION N/A 09/09/2018   Procedure: LUMBAR SPINAL CORD STIMULATOR LEAD AND BATTERY REMOVAL AND LAMINECTOMY FOR PLACEMENT OF PADDLE;  Surgeon: Lucy Chris, MD;  Location: [facility] ORS;  Service: Neurosurgery;  Laterality: N/A;   SPINAL CORD STIMULATOR INSERTION N/A 09/16/2018   Procedure: PLACEMENT OF SPINAL CORD STIMULATOR BATTERY;  Surgeon: Lucy Chris, MD;  Location: [facility] ORS;  Service: Neurosurgery;  Laterality: N/A;   SPINAL CORD STIMULATOR REMOVAL N/A 06/03/2022   Procedure:  LUMBAR SPINAL CORD STIMULATOR REMOVAL;  Surgeon: Venetia Night, MD;  Location: [facility] ORS;  Service: Neurosurgery;  Laterality: N/A;     Social History   Tobacco Use   Smoking status: Never    Passive exposure: Never   Smokeless tobacco: Never  Vaping Use   Vaping status: Never Used  Substance Use Topics   Alcohol use: No    Comment: QUIT DRINKING   Drug use: No      Family History  Problem Relation Age of Onset   Stroke Mother    Kidney disease Father    Leukemia Brother    Stroke Sister    Diabetes Neg Hx      Allergies  Allergen Reactions   Questran [Cholestyramine]     Patient not aware of an allergy to this medicine.   Atorvastatin     Muscle pain   Gemfibrozil Other (See Comments)    Muscle pain.   Metformin And Related     Dizziness   Rosuvastatin     Muscle pain     REVIEW OF SYSTEMS (Negative unless checked)  Constitutional: [] Weight loss  [] Fever  [] Chills Cardiac: [] Chest pain   [] Chest pressure   [] Palpitations   [] Shortness of breath when laying flat   [] Shortness of breath at rest   [] Shortness of breath with exertion. Vascular:  [] Pain in legs with walking   [] Pain in legs at rest   [] Pain in legs when laying flat   [] Claudication   [] Pain in feet when walking  [] Pain in feet at rest  [] Pain in feet when laying flat   [] History of DVT   [] Phlebitis   [] Swelling in legs   [] Varicose veins   [] Non-healing ulcers Pulmonary:   [] Uses home oxygen   [] Productive cough   [] Hemoptysis   [] Wheeze  [] COPD   []   Asthma Neurologic:  [] Dizziness  [] Blackouts   [] Seizures   [] History of stroke   [x] History of TIA  [] Aphasia   [] Temporary blindness   [] Dysphagia   [] Weakness or numbness in arms   [] Weakness or numbness in legs Musculoskeletal:  [x] Arthritis   [] Joint swelling   [x] Joint pain   [] Low back pain Hematologic:  [] Easy bruising  [] Easy bleeding   [] Hypercoagulable state   [] Anemic   Gastrointestinal:  [] Blood in stool   [] Vomiting blood   [x] Gastroesophageal reflux/heartburn   [] Abdominal pain Genitourinary:  [] Chronic kidney disease   [] Difficult urination  [] Frequent urination  [] Burning with urination   [] Hematuria Skin:  [] Rashes   [] Ulcers   [] Wounds Psychological:  [] History of anxiety   []  History of major depression.  Physical Examination  BP (!) 194/98   Pulse 61   Resp 18   Ht 6\' 2"  (1.88 m)   Wt 270 lb (122.5 kg)   BMI 34.67 kg/m  Gen:  WD/WN, NAD. Appears younger than stated age. Head: [place]/AT, No temporalis wasting. Ear/Nose/Throat: Hearing grossly intact, nares w/o erythema or drainage Eyes: Conjunctiva clear. Sclera non-icteric Neck: Supple.  Trachea midline Pulmonary:  Good air movement, no use of accessory muscles.  Cardiac: RRR, no JVD Vascular:  Vessel Right Left  Radial Palpable Palpable               Musculoskeletal: M/S 5/5 throughout.  No deformity or atrophy. Walks with a walker Neurologic: Sensation grossly intact in extremities.  Symmetrical.  Speech is fluent.  Psychiatric: Judgment intact, Mood & affect appropriate for pt's clinical situation. Dermatologic: No rashes or ulcers noted.  No cellulitis or open wounds.      Labs Recent Results (from the past 2160 hours)  POCT Urinalysis Dipstick (Automated)     Status: Abnormal   Collection Time: 07/25/23  5:02 PM  Result Value Ref Range   Color, UA yellow    Clarity, UA clear    Glucose, UA Negative Negative   Bilirubin, UA negative    Ketones, UA negative    Spec Grav, UA 1.025 1.010 - 1.025   Blood, UA negative    pH, UA 5.0 5.0 - 8.0   Protein, UA Positive (A) Negative    Comment: 15 mg/dL   Urobilinogen, UA 0.2 0.2 or 1.0 E.U./dL   Nitrite, UA negative    Leukocytes, UA Negative Negative  Lipid panel     Status: Abnormal   Collection Time: 08/10/23 11:53 AM  Result Value Ref Range   Cholesterol 186 0 - 200 mg/dL    Comment: ATP III Classification       Desirable:  < 200 mg/dL               Borderline High:  200 - 239  mg/dL          High:  > = 413 mg/dL   Triglycerides (H) 0.0 - 149.0 mg/dL    244.0 Triglyceride is over 400; calculations on Lipids are invalid.    Comment: Normal:  <150 mg/dLBorderline High:  150 - 199 mg/dL   HDL 10.27 (L) >25.36 mg/dL   Total CHOL/HDL Ratio 5     Comment:                Men          Women1/2 Average Risk     3.4          3.3Average Risk  5.0          4.42X Average Risk          9.6          7.13X Average Risk          15.0          11.0                      Comprehensive metabolic panel     Status: Abnormal   Collection Time: 08/10/23 11:53 AM  Result Value Ref Range   Sodium 138 135 - 145 mEq/L   Potassium 4.3 3.5 - 5.1 mEq/L   Chloride 101 96 - 112 mEq/L   CO2 32 19 - 32 mEq/L   Glucose, Bld 122 (H) 70 - 99 mg/dL   BUN 23 6 - 23 mg/dL   Creatinine, Ser 1.61 0.40 - 1.50 mg/dL   Total Bilirubin 0.6 0.2 - 1.2 mg/dL   Alkaline Phosphatase 79 39 - 117 U/L   AST 22 0 - 37 U/L   ALT 14 0 - 53 U/L   Total Protein 7.4 6.0 - 8.3 g/dL   Albumin 4.1 3.5 - 5.2 g/dL   GFR 09.60 (L) >45.40 mL/min    Comment: Calculated using the CKD-EPI Creatinine Equation (2021)   Calcium 10.2 8.4 - 10.5 mg/dL  CBC with Differential/Platelet     Status: Abnormal   Collection Time: 08/10/23 11:53 AM  Result Value Ref Range   WBC 8.7 4.0 - 10.5 K/uL   RBC 4.58 4.22 - 5.81 Mil/uL   Hemoglobin 14.5 13.0 - 17.0 g/dL   HCT 98.1 19.1 - 47.8 %   MCV 95.0 78.0 - 100.0 fl   MCHC 33.3 30.0 - 36.0 g/dL   RDW 29.5 62.1 - 30.8 %   Platelets 256.0 150.0 - 400.0 K/uL   Neutrophils Relative % 41.4 (L) 43.0 - 77.0 %   Lymphocytes Relative 37.0 12.0 - 46.0 %   Monocytes Relative 9.4 3.0 - 12.0 %   Eosinophils Relative 9.9 (H) 0.0 - 5.0 %   Basophils Relative 2.3 0.0 - 3.0 %   Neutro Abs 3.6 1.4 - 7.7 K/uL   Lymphs Abs 3.2 0.7 - 4.0 K/uL   Monocytes Absolute 0.8 0.1 - 1.0 K/uL   Eosinophils Absolute 0.9 (H) 0.0 - 0.7 K/uL   Basophils Absolute 0.2 (H) 0.0 - 0.1 K/uL  LDL cholesterol, direct      Status: None   Collection Time: 08/10/23 11:53 AM  Result Value Ref Range   Direct LDL 63.0 mg/dL    Comment: Optimal:  <657 mg/dLNear or Above Optimal:  100-129 mg/dLBorderline High:  130-159 mg/dLHigh:  160-189 mg/dLVery High:  >190 mg/dL  CBC with Differential     Status: Abnormal   Collection Time: 08/17/23  7:37 AM  Result Value Ref Range   WBC 12.5 (H) 4.0 - 10.5 K/uL   RBC 4.75 4.22 - 5.81 MIL/uL   Hemoglobin 14.6 13.0 - 17.0 g/dL   HCT 84.6 96.2 - 95.2 %   MCV 91.4 80.0 - 100.0 fL   MCH 30.7 26.0 - 34.0 pg   MCHC 33.6 30.0 - 36.0 g/dL   RDW 84.1 32.4 - 40.1 %   Platelets 264 150 - 400 K/uL   nRBC 0.0 0.0 - 0.2 %   Neutrophils Relative % 66 %   Neutro Abs 8.3 (H) 1.7 - 7.7 K/uL   Lymphocytes Relative 22 %   Lymphs Abs 2.8  0.7 - 4.0 K/uL   Monocytes Relative 8 %   Monocytes Absolute 1.0 0.1 - 1.0 K/uL   Eosinophils Relative 2 %   Eosinophils Absolute 0.2 0.0 - 0.5 K/uL   Basophils Relative 1 %   Basophils Absolute 0.2 (H) 0.0 - 0.1 K/uL   Immature Granulocytes 1 %   Abs Immature Granulocytes 0.11 (H) 0.00 - 0.07 K/uL    Comment: Performed at [facility], [address], [place], [place] [address]  Comprehensive metabolic panel     Status: Abnormal   Collection Time: 08/17/23  7:37 AM  Result Value Ref Range   Sodium 135 135 - 145 mmol/L   Potassium 4.0 3.5 - 5.1 mmol/L   Chloride 97 (L) 98 - 111 mmol/L   CO2 28 22 - 32 mmol/L   Glucose, Bld 145 (H) 70 - 99 mg/dL    Comment: Glucose reference range applies only to samples taken after fasting for at least 8 hours.   BUN 24 (H) 8 - 23 mg/dL   Creatinine, Ser 8.46 (H) 0.61 - 1.24 mg/dL   Calcium 96.2 8.9 - 95.2 mg/dL   Total Protein 8.1 6.5 - 8.1 g/dL   Albumin 4.3 3.5 - 5.0 g/dL   AST 25 15 - 41 U/L   ALT 17 0 - 44 U/L   Alkaline Phosphatase 84 38 - 126 U/L   Total Bilirubin 1.1 <1.2 mg/dL   GFR, Estimated 52 (L) >60 mL/min    Comment: (NOTE) Calculated using the CKD-EPI Creatinine Equation  (2021)    Anion gap 10 5 - 15    Comment: Performed at [facility], [address], [place], [place] [address]  Troponin I (High Sensitivity)     Status: Abnormal   Collection Time: 08/17/23  7:37 AM  Result Value Ref Range   Troponin I (High Sensitivity) 44 (H) <18 ng/L    Comment: (NOTE) Elevated high sensitivity troponin I (hsTnI) values and significant  changes across serial measurements may suggest ACS but many other  chronic and acute conditions are known to elevate hsTnI results.  Refer to the "Links" section for chest pain algorithms and additional  guidance. Performed at [facility], [address] [place], [place] [address]   TSH     Status: Abnormal   Collection Time: 08/17/23  7:37 AM  Result Value Ref Range   TSH 4.692 (H) 0.350 - 4.500 uIU/mL    Comment: Performed by a 3rd Generation assay with a functional sensitivity of <=0.01 uIU/mL. Performed at [facility], [address] [place], [place] [address]   Resp panel by RT-PCR (RSV, Flu A&B, Covid) Anterior Nasal Swab     Status: None   Collection Time: 08/17/23  7:38 AM   Specimen: Anterior Nasal Swab  Result Value Ref Range   SARS Coronavirus 2 by RT PCR NEGATIVE NEGATIVE    Comment: (NOTE) SARS-CoV-2 target nucleic acids are NOT DETECTED.  The SARS-CoV-2 RNA is generally detectable in upper respiratory specimens during the acute phase of infection. The lowest concentration of SARS-CoV-2 viral copies this assay can detect is 138 copies/mL. A negative result does not preclude SARS-Cov-2 infection and should not be used as the sole basis for treatment or other patient management decisions. A negative result may occur with  improper specimen collection/handling, submission of specimen other than nasopharyngeal swab, presence of viral mutation(s) within the areas targeted by this assay, and inadequate number of viral copies(<138 copies/mL). A negative result must  be combined  with clinical observations, patient history, and epidemiological information. The expected result is Negative.  Fact Sheet for Patients:  BloggerCourse.com  Fact Sheet for Healthcare Providers:  SeriousBroker.it  This test is no t yet approved or cleared by the [place] FDA and  has been authorized for detection and/or diagnosis of SARS-CoV-2 by FDA under an Emergency Use Authorization (EUA). This EUA will remain  in effect (meaning this test can be used) for the duration of the COVID-19 declaration under Section 564(b)(1) of the Act, 21 U.S.C.section 360bbb-3(b)(1), unless the authorization is terminated  or revoked sooner.       Influenza A by PCR NEGATIVE NEGATIVE   Influenza B by PCR NEGATIVE NEGATIVE    Comment: (NOTE) The Xpert Xpress SARS-CoV-2/FLU/RSV plus assay is intended as an aid in the diagnosis of influenza from Nasopharyngeal swab specimens and should not be used as a sole basis for treatment. Nasal washings and aspirates are unacceptable for Xpert Xpress SARS-CoV-2/FLU/RSV testing.  Fact Sheet for Patients: BloggerCourse.com  Fact Sheet for Healthcare Providers: SeriousBroker.it  This test is not yet approved or cleared by the [place] FDA and has been authorized for detection and/or diagnosis of SARS-CoV-2 by FDA under an Emergency Use Authorization (EUA). This EUA will remain in effect (meaning this test can be used) for the duration of the COVID-19 declaration under Section 564(b)(1) of the Act, 21 U.S.C. section 360bbb-3(b)(1), unless the authorization is terminated or revoked.     Resp Syncytial Virus by PCR NEGATIVE NEGATIVE    Comment: (NOTE) Fact Sheet for Patients: BloggerCourse.com  Fact Sheet for Healthcare Providers: SeriousBroker.it  This test is not yet approved or cleared by the  [place] FDA and has been authorized for detection and/or diagnosis of SARS-CoV-2 by FDA under an Emergency Use Authorization (EUA). This EUA will remain in effect (meaning this test can be used) for the duration of the COVID-19 declaration under Section 564(b)(1) of the Act, 21 U.S.C. section 360bbb-3(b)(1), unless the authorization is terminated or revoked.  Performed at [facility], [address] [place], [place] [address]   CK     Status: None   Collection Time: 08/17/23  7:46 AM  Result Value Ref Range   Total CK 181 49 - 397 U/L    Comment: Performed at [facility], [address] [place], [place] [address]  Urinalysis, Routine w reflex microscopic -Urine, Clean Catch     Status: Abnormal   Collection Time: 08/17/23 11:00 AM  Result Value Ref Range   Color, Urine YELLOW (A) YELLOW   APPearance CLEAR (A) CLEAR   Specific Gravity, Urine 1.021 1.005 - 1.030   pH 7.0 5.0 - 8.0   Glucose, UA NEGATIVE NEGATIVE mg/dL   Hgb urine dipstick NEGATIVE NEGATIVE   Bilirubin Urine NEGATIVE NEGATIVE   Ketones, ur NEGATIVE NEGATIVE mg/dL   Protein, ur 098 (A) NEGATIVE mg/dL   Nitrite NEGATIVE NEGATIVE   Leukocytes,Ua TRACE (A) NEGATIVE   RBC / HPF 6-10 0 - 5 RBC/hpf   WBC, UA 21-50 0 - 5 WBC/hpf   Bacteria, UA RARE (A) NONE SEEN   Squamous Epithelial / HPF 0-5 0 - 5 /HPF   Mucus PRESENT     Comment: Performed at [facility], [address] [place], [place] [address]  Troponin I (High Sensitivity)     Status: Abnormal   Collection Time: 08/17/23 11:50 AM  Result Value Ref Range   Troponin I (High Sensitivity) 50 (H) <  18 ng/L    Comment: (NOTE) Elevated high sensitivity troponin I (hsTnI) values and significant  changes across serial measurements may suggest ACS but many other  chronic and acute conditions are known to elevate hsTnI results.  Refer to the "Links" section for chest pain algorithms and additional  guidance. Performed at  [facility], [address] [place], [place] [address]   CBC     Status: Abnormal   Collection Time: 08/18/23  5:29 AM  Result Value Ref Range   WBC 11.5 (H) 4.0 - 10.5 K/uL   RBC 4.48 4.22 - 5.81 MIL/uL   Hemoglobin 14.0 13.0 - 17.0 g/dL   HCT 29.5 62.1 - 30.8 %   MCV 91.5 80.0 - 100.0 fL   MCH 31.3 26.0 - 34.0 pg   MCHC 34.1 30.0 - 36.0 g/dL   RDW 65.7 84.6 - 96.2 %   Platelets 233 150 - 400 K/uL   nRBC 0.0 0.0 - 0.2 %    Comment: Performed at [facility], [address] [place], [place] [address]  Culture, blood (Routine X 2) w Reflex to ID Panel     Status: None   Collection Time: 08/18/23 10:56 AM   Specimen: BLOOD  Result Value Ref Range   Specimen Description BLOOD BLH    Special Requests      BOTTLES DRAWN AEROBIC AND ANAEROBIC Blood Culture adequate volume   Culture      NO GROWTH 5 DAYS Performed at [facility], [address], [place], [place] [address]    Report Status 08/23/2023 FINAL   Culture, blood (Routine X 2) w Reflex to ID Panel     Status: None   Collection Time: 08/18/23 10:59 AM   Specimen: BLOOD  Result Value Ref Range   Specimen Description BLOOD RAC    Special Requests      BOTTLES DRAWN AEROBIC AND ANAEROBIC Blood Culture adequate volume   Culture      NO GROWTH 5 DAYS Performed at [facility], [address] [place], [place] [address]    Report Status 08/23/2023 FINAL   T4, free     Status: None   Collection Time: 08/18/23 10:59 AM  Result Value Ref Range   Free T4 0.80 0.61 - 1.12 ng/dL    Comment: (NOTE) Biotin ingestion may interfere with free T4 tests. If the results are inconsistent with the TSH level, previous test results, or the clinical presentation, then consider biotin interference. If needed, order repeat testing after stopping biotin. Performed at [facility], [address] [place], [place] [address]   Vitamin B12     Status: None   Collection Time: 08/18/23 10:59  AM  Result Value Ref Range   Vitamin B-12 517 180 - 914 pg/mL    Comment: (NOTE) This assay is not validated for testing neonatal or myeloproliferative syndrome specimens for Vitamin B12 levels. Performed at [facility], [address], [place], [place] [address]   Blood gas, venous     Status: Abnormal   Collection Time: 08/18/23 10:59 AM  Result Value Ref Range   pH, Ven 7.43 7.25 - 7.43   pCO2, Ven 42 (L) 44 - 60 mmHg   Bicarbonate 27.9 20.0 - 28.0 mmol/L   Acid-Base Excess 3.2 (H) 0.0 - 2.0 mmol/L   O2 Saturation 47.9 %   Patient temperature 37.0    Collection site VEIN     Comment: Performed at [facility], [address] [place], [place] [address]  HIV Antibody (routine testing w rflx)     Status: None   Collection Time: 08/18/23 10:59 AM  Result Value Ref Range   HIV Screen 4th Generation wRfx Non Reactive Non Reactive    Comment: Performed at [facility], [address], [place], [place] [address]  Urine Culture     Status: None   Collection Time: 08/18/23 11:50 AM   Specimen: Urine, Clean Catch  Result Value Ref Range   Specimen Description      URINE, CLEAN CATCH Performed at [facility], [address] [place], [place] [address]    Special Requests      NONE Performed at [facility], [address] [place], [place] [address]    Culture      NO GROWTH Performed at [facility], [address] [place], [place] [address]    Report Status 08/19/2023 FINAL   Comprehensive metabolic panel     Status: Abnormal   Collection Time: 08/18/23  7:32 PM  Result Value Ref Range   Sodium 134 (L) 135 - 145 mmol/L   Potassium 3.6 3.5 - 5.1 mmol/L   Chloride 99 98 - 111 mmol/L   CO2 27 22 - 32 mmol/L   Glucose, Bld 144 (H) 70 - 99 mg/dL    Comment: Glucose reference range applies only to samples taken after fasting for at least 8 hours.   BUN 21 8 - 23 mg/dL   Creatinine, Ser 1.30 0.61 - 1.24 mg/dL   Calcium 86.5  8.9 - 78.4 mg/dL   Total Protein 7.3 6.5 - 8.1 g/dL   Albumin 3.9 3.5 - 5.0 g/dL   AST 28 15 - 41 U/L   ALT 14 0 - 44 U/L   Alkaline Phosphatase 68 38 - 126 U/L   Total Bilirubin 1.0 <1.2 mg/dL   GFR, Estimated >69 >62 mL/min    Comment: (NOTE) Calculated using the CKD-EPI Creatinine Equation (2021)    Anion gap 8 5 - 15    Comment: Performed at [facility], [address] [place], [place] [address]  CBC     Status: Abnormal   Collection Time: 08/19/23  8:36 AM  Result Value Ref Range   WBC 9.0 4.0 - 10.5 K/uL   RBC 4.21 (L) 4.22 - 5.81 MIL/uL   Hemoglobin 13.1 13.0 - 17.0 g/dL   HCT 13.2 (L) 44.0 - 10.2 %   MCV 92.2 80.0 - 100.0 fL   MCH 31.1 26.0 - 34.0 pg   MCHC 33.8 30.0 - 36.0 g/dL   RDW 72.5 36.6 - 44.0 %   Platelets 234 150 - 400 K/uL   nRBC 0.0 0.0 - 0.2 %    Comment: Performed at [facility], [address], [place], [place] [address]  Basic metabolic panel     Status: Abnormal   Collection Time: 08/19/23  8:36 AM  Result Value Ref Range   Sodium 134 (L) 135 - 145 mmol/L   Potassium 3.4 (L) 3.5 - 5.1 mmol/L   Chloride 98 98 - 111 mmol/L   CO2 27 22 - 32 mmol/L   Glucose, Bld 115 (H) 70 - 99 mg/dL    Comment: Glucose reference range applies only to samples taken after fasting for at least 8 hours.   BUN 25 (H) 8 - 23 mg/dL   Creatinine, Ser 5.95 (H) 0.61 - 1.24 mg/dL   Calcium 9.9 8.9 - 10.3  mg/dL   GFR, Estimated 56 (L) >60 mL/min    Comment: (NOTE) Calculated using the CKD-EPI Creatinine Equation (2021)    Anion gap 9 5 - 15    Comment: Performed at [facility], [address] [place], [place] [address]  CBC     Status: None   Collection Time: 08/20/23  5:14 AM  Result Value Ref Range   WBC 7.9 4.0 - 10.5 K/uL   RBC 4.42 4.22 - 5.81 MIL/uL   Hemoglobin 13.6 13.0 - 17.0 g/dL   HCT 60.4 54.0 - 98.1 %   MCV 94.1 80.0 - 100.0 fL   MCH 30.8 26.0 - 34.0 pg   MCHC 32.7 30.0 - 36.0 g/dL   RDW 19.1 47.8 - 29.5 %   Platelets  251 150 - 400 K/uL   nRBC 0.0 0.0 - 0.2 %    Comment: Performed at [facility], [address] [place], [place] [address]  Basic metabolic panel     Status: Abnormal   Collection Time: 08/20/23  5:14 AM  Result Value Ref Range   Sodium 133 (L) 135 - 145 mmol/L   Potassium 3.4 (L) 3.5 - 5.1 mmol/L   Chloride 97 (L) 98 - 111 mmol/L   CO2 29 22 - 32 mmol/L   Glucose, Bld 107 (H) 70 - 99 mg/dL    Comment: Glucose reference range applies only to samples taken after fasting for at least 8 hours.   BUN 35 (H) 8 - 23 mg/dL   Creatinine, Ser 8.65 (H) 0.61 - 1.24 mg/dL   Calcium 9.9 8.9 - 78.4 mg/dL   GFR, Estimated 51 (L) >60 mL/min    Comment: (NOTE) Calculated using the CKD-EPI Creatinine Equation (2021)    Anion gap 7 5 - 15    Comment: Performed at [facility], [address] [place], [place] [address]  Basic metabolic panel     Status: Abnormal   Collection Time: 09/04/23  2:35 PM  Result Value Ref Range   Sodium 138 135 - 145 mEq/L   Potassium 4.1 3.5 - 5.1 mEq/L   Chloride 99 96 - 112 mEq/L   CO2 32 19 - 32 mEq/L   Glucose, Bld 135 (H) 70 - 99 mg/dL   BUN 25 (H) 6 - 23 mg/dL   Creatinine, Ser 5.28 0.40 - 1.50 mg/dL   GFR 41.32 (L) >44.01 mL/min    Comment: Calculated using the CKD-EPI Creatinine Equation (2021)   Calcium 10.4 8.4 - 10.5 mg/dL  Magnesium     Status: None   Collection Time: 09/04/23  2:35 PM  Result Value Ref Range   Magnesium 2.0 1.5 - 2.5 mg/dL  Bladder Scan (Post Void Residual) in office     Status: None   Collection Time: 09/13/23  2:28 PM  Result Value Ref Range   Scan Result 2ml     Radiology CT ANGIO NECK W OR WO CONTRAST Result Date: 10/05/2023 CLINICAL DATA:  Carotid artery stenosis. Abnormal ultrasound exam with bilateral disease. EXAM: CT ANGIOGRAPHY NECK TECHNIQUE: Multidetector CT imaging of the neck was performed using the standard protocol during bolus administration of intravenous contrast. Multiplanar CT image  reconstructions and MIPs were obtained to evaluate the vascular anatomy. Carotid stenosis measurements (when applicable) are obtained utilizing NASCET criteria, using the distal internal carotid diameter as the denominator. RADIATION DOSE REDUCTION: This exam was performed according to the departmental dose-optimization program which includes automated exposure control, adjustment of the mA and/or kV  according to patient size and/or use of iterative reconstruction technique. CONTRAST:  75mL OMNIPAQUE IOHEXOL 350 MG/ML SOLN COMPARISON:  Ultrasound 08/19/2023 FINDINGS: Aortic arch: Aortic atherosclerosis. Along the under surface of the arch, there is irregular plaque protruding into the lumen. This could be soft plaque or thrombus and could result in embolic disease. This is beyond the origin of the brachiocephalic vessels. Right carotid system: Common carotid artery widely patent to the bifurcation. Calcified plaque at the carotid bifurcation and ICA bulb. There is artifact related to dental disease at this area, causing some loss of detail. Minimal diameter in the distal bulb is 3.25 mm. Compared to a more distal cervical ICA diameter of 6.1 mm, this indicates a 47% stenosis. No stenosis seen beyond that to the skull base. Left carotid system: Common carotid artery widely patent to the bifurcation region. Calcified plaque at the carotid bifurcation and ICA bulb. Minimal diameter at the distal bulb is 5 mm. Compared to a more distal cervical ICA diameter of the same, there is no stenosis. Vertebral arteries: Both vertebral artery origins are widely patent. Both vessels appear widely patent through the cervical region, through the foramen magnum to the basilar artery. There is atherosclerotic plaque in the vertebral artery V4 segments but no stenosis. Skeleton: Ordinary cervical spondylosis. Other neck: No mass or lymphadenopathy. Upper chest: Lung apices are clear. IMPRESSION: 1. Aortic atherosclerosis. Along the  under surface of the arch, there is irregular plaque protruding into the lumen. This could be soft plaque or thrombus and could result in systemic embolic disease. This is beyond the origin of the brachiocephalic vessels however. 2. Calcified plaque at the right carotid bifurcation and ICA bulb. There is artifact related to dental disease at this area, causing some loss of detail. Minimal diameter in the distal bulb is 3.25 mm. Compared to a more distal cervical ICA diameter of 6.1 mm, this indicates a 47% stenosis. No stenosis seen beyond that to the skull base. 3. Calcified plaque at the left carotid bifurcation and ICA bulb. No stenosis. 4. Atherosclerotic plaque in the vertebral artery V4 segments but no stenosis. 5. These results will be called to the ordering clinician or representative by the Radiologist Assistant, and communication documented in the PACS or [employer]. Electronically Signed   By: Paulina Fusi M.D.   On: 10/05/2023 20:40    Assessment/Plan  Abdominal aortic aneurysm (AAA) without rupture [facility]) Status post endovascular repair of the abdominal aortic aneurysm about 3-1/2 years ago.  Scheduled for his follow-up duplex in a few months.  Essential hypertension blood pressure control important in reducing the progression of atherosclerotic disease and aneurysmal growth. On appropriate oral medications.   Carotid stenosis I have independently reviewed his CT angiogram of the carotid arteries performed last month.  The official report is of a 47% right ICA stenosis and calcific plaque with no stenosis in the left carotid artery.  I think this is a reasonable estimate as I had it had roughly 50% ICA stenosis on the right by my interpretation and 20 to 25% stenosis on the left.  This would be below the threshold for consideration for intervention.  I would recommend given his previous history of embolic phenomenon dual antiplatelet therapy for this moderate right carotid stenosis.  A  new prescription for Plavix can be sent in today.  He has a scheduled follow-up visit in several months for his aneurysm, and we will add a carotid duplex at that time for follow-up.    Barbara Cower  Wyn Quaker, MD  10/23/2023 3:44 PM    This note was created with Dragon medical transcription system.  Any errors from dictation are purely unintentional

## 2023-10-23 NOTE — Assessment & Plan Note (Addendum)
blood pressure control important in reducing the progression of atherosclerotic disease and aneurysmal growth. On appropriate oral medications.  

## 2023-10-24 ENCOUNTER — Other Ambulatory Visit: Payer: Self-pay | Admitting: Family Medicine

## 2023-10-24 DIAGNOSIS — I10 Essential (primary) hypertension: Secondary | ICD-10-CM

## 2023-10-24 MED ORDER — LOSARTAN POTASSIUM 25 MG PO TABS: 25.0000 mg | ORAL_TABLET | Freq: Every day | ORAL | 4 refills | Status: DC

## 2023-10-24 NOTE — Telephone Encounter (Signed)
 Received message from [facility] regarding elevated systolic blood pressures consistently >160 since 08/2023.   Plz touch base with son Dr Sherrie Mustache.  Current antihypertensive regimen is lasix 40mg  daily, toprol XL 50mg  daily. Ensure he's taking both of these.  Would recommend starting losartan 25mg  daily - if son agrees, may send losartan 25mg  daily (Rx pended) to preferred pharmacy (I think it's [facility]).   Let me know what son says.

## 2023-10-24 NOTE — Telephone Encounter (Signed)
 Noted thank.  Will place notice from pharmacy in [name]'s box to fax back.

## 2023-10-24 NOTE — Telephone Encounter (Signed)
 Faxed form to back to [facility] at [phone].

## 2023-10-24 NOTE — Telephone Encounter (Signed)
 Spoke with pt's son, [name] (on dpr), relaying Dr Timoteo Expose message. He confirms pt is taking Lasix 40 mg daily and Toprol XL 50 mg daily. Don verbalizes understanding about losartan 25 mg daily and sending rx to [facility]. Fyi to Dr Reece Agar.  E-scribed rx.

## 2023-11-22 ENCOUNTER — Ambulatory Visit: Payer: Medicare Other | Attending: Internal Medicine | Admitting: Internal Medicine

## 2023-11-22 VITALS — BP 100/68 | HR 76 | Ht 74.0 in | Wt 290.0 lb

## 2023-11-22 DIAGNOSIS — I5030 Unspecified diastolic (congestive) heart failure: Secondary | ICD-10-CM | POA: Insufficient documentation

## 2023-11-22 DIAGNOSIS — I4892 Unspecified atrial flutter: Secondary | ICD-10-CM | POA: Diagnosis not present

## 2023-11-22 DIAGNOSIS — E782 Mixed hyperlipidemia: Secondary | ICD-10-CM | POA: Insufficient documentation

## 2023-11-22 DIAGNOSIS — I10 Essential (primary) hypertension: Secondary | ICD-10-CM | POA: Insufficient documentation

## 2023-11-22 NOTE — Patient Instructions (Signed)
 Medication Instructions:  The current medical regimen is effective;  continue present plan and medications as directed. Please refer to the Current Medication list given to you today.   *If you need a refill on your cardiac medications before your next appointment, please call your pharmacy*   Follow-Up: At [facility], you and your health needs are our priority.  As part of our continuing mission to provide you with exceptional heart care, we have created designated Provider Care Teams.  These Care Teams include your primary Cardiologist (physician) and Advanced Practice Providers (APPs -  Physician Assistants and Nurse Practitioners) who all work together to provide you with the care you need, when you need it.  We recommend signing up for the patient portal called "MyChart".  Sign up information is provided on this After Visit Summary.  MyChart is used to connect with patients for Virtual Visits (Telemedicine).  Patients are able to view lab/test results, encounter notes, upcoming appointments, etc.  Non-urgent messages can be sent to your provider as well.   To learn more about what you can do with MyChart, go to [email or web address].    Your next appointment:   6 month(s)  Provider:   Julien Nordmann, MD  to establish care

## 2023-11-22 NOTE — Progress Notes (Unsigned)
 Patient Care Team: Eustaquio Boyden, MD as PCP - General (Family Medicine) Edward Jolly, MD as Consulting Physician (Pain Medicine) Duke Salvia, MD as Consulting Physician (Cardiology) Kathyrn Sheriff, [facility] (Inactive) as Pharmacist (Pharmacist)   HPI  GUALBERTO WAHLEN is a 88 y.o. male is the  father of Dr. Jamse Belfast with a history of atrial flutter for which he underwent catheter ablation 9/18.   History of coronary artery disease with prior stenting of his LAD and his RCA in 2013.  Reviewing his history of his stent, both were accomplished without antecedent symptoms because of findings on imaging testing.    Hospitalized 10/23 w Coag Neg [facility] bacteremia assoc with abscess in epidural space     Hyperlipidemia including hypertriglyceridemia >> was referred to the lipid clinic and was started on a PCSK9 inhibitor; also ezetimibe and icosapent   The patient denies chest pain, nocturnal dyspnea, orthopnea or peripheral edem.  There have been no palpitations or syncope.  Complains of shortness of breath , according to his son stable.  Denies LH but has spells when he feels like he might fall down  BP at last vist with Dr Wyn Quaker was 200+;  they decided at that visit to combine ASA plavix ( Hx o AAA and carotid disesae) .   Date Cr K Hgb LDL  9/18   14.4   2/19 1.45 4.3     9/19  1.25 3.8 14.0   4/20 1.22 4.0    8/21 1.8 4.2 13.2   5/22 1.44 4.0    11/23 1.03 4.0 11.4 47 (8/23)  1/25 1.36 4.1 13.6    DATE TEST EF   6/18 Echo   55-65 %   4/19 MYOVIEW  57 % Non Ischemic  9/23 Echo  55%       Past Medical History:  Diagnosis Date   Abscess in epidural space of lumbar spine 06/05/2022   Anosmia 1980s   Atrial flutter (HCC) 2018   had an ablation with dr. Tollie Pizza   Basal cell carcinoma 01/11/2022   L cheek - excised 04/25/22   CAD (coronary artery disease)    Cancer (HCC)    skin cancer   Chronic constipation    Chronic insomnia    Chronic lower back pain     s/p spine stimulator placement   Coag negative Staphylococcus bacteremia    Complication of anesthesia    when under general, he woke up agitated and unable to be held down   Diastolic CHF (HCC)    Discitis of lumbosacral region    History of diverticulitis 2017   History of pneumonia 2013   Hyperlipidemia    Hypertension    Hypothyroidism    Irregular heart rhythm 07/29/2022   Obesity, Class II, BMI 35-39.9, with comorbidity    OSA on CPAP    Peptic ulcer disease 07/29/2022   PONV (postoperative nausea and vomiting)    Vertebral osteomyelitis (HCC)     Past Surgical History:  Procedure Laterality Date   A-FLUTTER ABLATION N/A 05/07/2017   Procedure: A-Flutter Ablation;  Surgeon: Duke Salvia, MD;  Location: [facility] INVASIVE CV LAB;  Service: Cardiovascular;  Laterality: N/A;   CARDIOVASCULAR STRESS TEST  11/2017   no ischemia, low risk study   COLONOSCOPY  2007   normal per prior PCP records, rpt 10 yrs (Dr Rosealee Albee)   ELBOW SURGERY Right    ulnar nerve decompression.  PT DOES NOT RECALL THIS  PROCEDURE   ENDOVASCULAR REPAIR/STENT GRAFT N/A 05/05/2020   Procedure: ENDOVASCULAR REPAIR/STENT GRAFT;  Surgeon: Annice Needy, MD;  Location: [facility] INVASIVE CV LAB;  Service: Cardiovascular;  Laterality: N/A;   ESOPHAGOGASTRODUODENOSCOPY (EGD) WITH PROPOFOL N/A 05/30/2022   Procedure: ESOPHAGOGASTRODUODENOSCOPY (EGD) WITH PROPOFOL;  Surgeon: Wyline Mood, MD;  Location: [facility] ENDOSCOPY;  Service: Gastroenterology;  Laterality: N/A;   LAMINECTOMY THORACIC SPINE W/ PLACEMENT SPINAL CORD STIMULATOR  08/2018   and removal of prior spine stimulator (Dr Adriana Simas)   LUMBAR LAMINECTOMY FOR EPIDURAL ABSCESS Bilateral 06/05/2022   Procedure: LUMBAR 2- L3, L5-S1 LAMINECTOMY FOR EPIDURAL ABSCESS;  Surgeon: Venetia Night, MD;  Location: [facility] ORS;  Service: Neurosurgery;  Laterality: Bilateral;   NASAL SINUS SURGERY     nasal polyps. done a long time ago   PERCUTANEOUS CORONARY STENT INTERVENTION  (PCI-S)  2013   EF55%, 70% mid LAD, 99% mid RCA, mild MR, elev LVEDP, DES to mid LAD. RCA is nondominant   RADIOFREQUENCY ABLATION  06/2017   lumbar region [facility])    REPLACEMENT TOTAL KNEE BILATERAL Bilateral 2000s   SPINAL CORD STIMULATOR IMPLANT  06/2016   SPINAL CORD STIMULATOR INSERTION N/A 09/09/2018   Procedure: LUMBAR SPINAL CORD STIMULATOR LEAD AND BATTERY REMOVAL AND LAMINECTOMY FOR PLACEMENT OF PADDLE;  Surgeon: Lucy Chris, MD;  Location: [facility] ORS;  Service: Neurosurgery;  Laterality: N/A;   SPINAL CORD STIMULATOR INSERTION N/A 09/16/2018   Procedure: PLACEMENT OF SPINAL CORD STIMULATOR BATTERY;  Surgeon: Lucy Chris, MD;  Location: [facility] ORS;  Service: Neurosurgery;  Laterality: N/A;   SPINAL CORD STIMULATOR REMOVAL N/A 06/03/2022   Procedure: LUMBAR SPINAL CORD STIMULATOR REMOVAL;  Surgeon: Venetia Night, MD;  Location: [facility] ORS;  Service: Neurosurgery;  Laterality: N/A;    Current Meds  Medication Sig   acetaminophen (TYLENOL) 500 MG tablet Take 1 tablet (500 mg total) by mouth 2 (two) times daily as needed for moderate pain.   Alirocumab (PRALUENT) 75 MG/ML SOAJ ADMINISTER 75MG  UNDER THE SKIN EVERY 14 DAYS   aspirin EC 81 MG tablet Take 1 tablet (81 mg total) by mouth daily.   ciclopirox (PENLAC) 8 % solution APPLY TOPICALLY AT BEDTIME APPLY OVER NAIL AND SURROUNDING SKIN, APPLY DAILY OVER PREVIOUS COAT, AFTER 7 DAYS MAY REMOVE WITH ALCOHOL AND REPEAT   clopidogrel (PLAVIX) 75 MG tablet Take 1 tablet (75 mg total) by mouth daily.   Cyanocobalamin (B-12) 1000 MCG SUBL Place 1 tablet under the tongue daily.   diphenhydramine-acetaminophen (TYLENOL PM) 25-500 MG TABS tablet Take 1 tablet by mouth at bedtime.   docusate sodium (COLACE) 100 MG capsule Take 100 mg by mouth daily.   DULoxetine (CYMBALTA) 20 MG capsule Take 1 capsule (20 mg total) by mouth daily.   ezetimibe (ZETIA) 10 MG tablet Take 1 tablet (10 mg total) by mouth daily.   finasteride (PROSCAR) 5 MG tablet  TAKE 1 TABLET(5 MG) BY MOUTH DAILY   fluocinolone (SYNALAR) 0.025 % cream Apply topically 2 (two) times daily as needed.   furosemide (LASIX) 40 MG tablet Take 1 tablet (40 mg total) by mouth daily.   gabapentin (NEURONTIN) 300 MG capsule TAKE 2 CAPSULE BY MOUTH AT BEDTIME WITH EXTRA CAPSULE DURING THE DAY AS NEEDED   hydrocortisone 2.5 % cream APPLY TO FACE AT BEDTIME TUES, THURS, SAT   ketoconazole (NIZORAL) 2 % cream APPLY TO FACE AT BEDTIME MON, WED, FRI   ketoconazole (NIZORAL) 2 % shampoo APPLY TOPICALLY 3 TIMES WEEKLY AS DIRECTED   levothyroxine (SYNTHROID) 175 MCG tablet  Take 1 tablet (175 mcg total) by mouth daily before breakfast.   lidocaine 4 % Place 1 patch onto the skin daily. OTC   LINZESS 290 MCG CAPS capsule TAKE 1 CAPSULE(290 MCG) BY MOUTH DAILY BEFORE BREAKFAST   losartan (COZAAR) 25 MG tablet Take 1 tablet (25 mg total) by mouth daily.   Melatonin 10 MG CAPS Take 10 mg by mouth at bedtime.   meloxicam (MOBIC) 7.5 MG tablet Take 7.5 mg by mouth daily.   metoprolol succinate (TOPROL-XL) 50 MG 24 hr tablet Take 1 tablet (50 mg total) by mouth daily.   mometasone (ELOCON) 0.1 % cream Apply 1 Application topically every Monday, Wednesday, and Friday. bedtime   Multiple Vitamins-Minerals (CENTRUM SILVER PO) Take 1 tablet by mouth daily.   Multiple Vitamins-Minerals (PRESERVISION AREDS 2 PO) Take 1 tablet by mouth daily.   nitroGLYCERIN (NITROSTAT) 0.4 MG SL tablet Place 1 tablet (0.4 mg total) under the tongue every 5 (five) minutes as needed for chest pain.   ondansetron (ZOFRAN) 4 MG tablet Take 4 mg by mouth every 6 (six) hours as needed for nausea.   Oxycodone HCl 10 MG TABS Take 1 tablet (10 mg total) by mouth 2 (two) times daily as needed (moderate pain).   pantoprazole (PROTONIX) 40 MG tablet Take 1 tablet (40 mg total) by mouth daily.   polyethylene glycol (MIRALAX / GLYCOLAX) 17 g packet Take 17 g by mouth daily.   senna (SENOKOT) 8.6 MG tablet Take 1 tablet (8.6 mg  total) by mouth daily.   sucralfate (CARAFATE) 1 g tablet Take 1 g by mouth 2 (two) times daily.   tamsulosin (FLOMAX) 0.4 MG CAPS capsule Take 1 capsule (0.4 mg total) by mouth daily.   tizanidine (ZANAFLEX) 2 MG capsule Take 2 mg by mouth 2 (two) times daily as needed for muscle spasms.   VASCEPA 1 g capsule TAKE 2 CAPSULES(2 GRAMS) BY MOUTH TWICE DAILY   Vitamin D, Cholecalciferol, 25 MCG (1000 UT) TABS Take 1 tablet by mouth daily.    Allergies  Allergen Reactions   Questran [Cholestyramine]     Patient not aware of an allergy to this medicine.   Atorvastatin     Muscle pain   Gemfibrozil Other (See Comments)    Muscle pain.   Metformin And Related     Dizziness   Rosuvastatin     Muscle pain      Review of Systems negative except from HPI and PMH  Physical Exam BP 100/68 (BP Location: Right Arm)   Pulse 76   Ht 6\' 2"  (1.88 m)   Wt 290 lb (131.5 kg)   SpO2 92%   BMI 37.23 kg/m  Well developed and nourished in no acute distress HENT normal Neck supple with JVP-  flat *** Clear Regular rate and rhythm, no murmurs or gallops Abd-soft with active BS No Clubbing cyanosis edema Skin-warm and dry A & Oriented  Grossly normal sensory and motor function  ECG sinus @ 76 19/10/38     Assessment and Plan:  Atrial flutter-typical  S/p ablation  HFpEF  Statin intolerance  Sinus tachycardia  Renal insufficiency grade 3  Hyperlipidemia  Coronary artery disease with prior stenting  Morbid obesity  Abdominal aortic aneurysm status post endovascular repair  Interval coag negative staph bacteremia     No angina.   ***       Sherryl Manges

## 2023-11-29 ENCOUNTER — Other Ambulatory Visit: Payer: Self-pay | Admitting: Internal Medicine

## 2023-12-17 DIAGNOSIS — M2042 Other hammer toe(s) (acquired), left foot: Secondary | ICD-10-CM | POA: Diagnosis not present

## 2023-12-17 DIAGNOSIS — I7091 Generalized atherosclerosis: Secondary | ICD-10-CM | POA: Diagnosis not present

## 2023-12-17 DIAGNOSIS — B351 Tinea unguium: Secondary | ICD-10-CM | POA: Diagnosis not present

## 2023-12-17 DIAGNOSIS — M2041 Other hammer toe(s) (acquired), right foot: Secondary | ICD-10-CM | POA: Diagnosis not present

## 2024-01-08 ENCOUNTER — Other Ambulatory Visit (INDEPENDENT_AMBULATORY_CARE_PROVIDER_SITE_OTHER): Payer: Medicare Other

## 2024-01-08 ENCOUNTER — Encounter (INDEPENDENT_AMBULATORY_CARE_PROVIDER_SITE_OTHER): Payer: Self-pay | Admitting: Vascular Surgery

## 2024-01-08 ENCOUNTER — Ambulatory Visit (INDEPENDENT_AMBULATORY_CARE_PROVIDER_SITE_OTHER): Payer: Medicare Other

## 2024-01-08 ENCOUNTER — Ambulatory Visit (INDEPENDENT_AMBULATORY_CARE_PROVIDER_SITE_OTHER): Payer: Medicare Other | Admitting: Vascular Surgery

## 2024-01-08 VITALS — BP 167/87 | HR 80 | Ht 74.0 in | Wt 300.0 lb

## 2024-01-08 DIAGNOSIS — I6523 Occlusion and stenosis of bilateral carotid arteries: Secondary | ICD-10-CM

## 2024-01-08 DIAGNOSIS — I714 Abdominal aortic aneurysm, without rupture, unspecified: Secondary | ICD-10-CM | POA: Diagnosis not present

## 2024-01-08 DIAGNOSIS — I10 Essential (primary) hypertension: Secondary | ICD-10-CM | POA: Diagnosis not present

## 2024-01-08 DIAGNOSIS — I7143 Infrarenal abdominal aortic aneurysm, without rupture: Secondary | ICD-10-CM | POA: Diagnosis not present

## 2024-01-08 NOTE — Progress Notes (Signed)
 MRN : [id number]  Anthony [name] is a 88 y.o. (Apr 03, 1936) male who presents with chief complaint of  Chief Complaint  Patient presents with   Follow-up    fu Carotid + Evar  .  History of Present Illness: Patient returns today in follow up of multiple vascular issues.  He is doing well today.  He has no new complaints related to his aneurysm.  His aneurysm repair was almost 4 years ago at this point.  His duplex today shows a patent stent graft without endoleak and a maximal sac diameter of 4.5 cm. We also follow him for carotid disease.  He has had no focal neurologic symptoms. Specifically, the patient denies amaurosis fugax, speech or swallowing difficulties, or arm or leg weakness or numbness. Carotid duplex showed velocities in the 1 to 39% range bilaterally.  No progression from his previous studies or his CT scan from several months ago which was discussed at his last visit.  Current Outpatient Medications  Medication Sig Dispense Refill   acetaminophen  (TYLENOL ) 500 MG tablet Take 1 tablet (500 mg total) by mouth 2 (two) times daily as needed for moderate pain.     Alirocumab  (PRALUENT ) 75 MG/ML SOAJ ADMINISTER 75MG  UNDER THE SKIN EVERY 14 DAYS 6 mL 3   aspirin  EC 81 MG tablet Take 1 tablet (81 mg total) by mouth daily.     ciclopirox  (PENLAC ) 8 % solution APPLY TOPICALLY AT BEDTIME APPLY OVER NAIL AND SURROUNDING SKIN, APPLY DAILY OVER PREVIOUS COAT, AFTER 7 DAYS MAY REMOVE WITH ALCOHOL AND REPEAT 6.6 mL 11   clopidogrel  (PLAVIX ) 75 MG tablet Take 1 tablet (75 mg total) by mouth daily. 30 tablet 6   Cyanocobalamin  (B-12) 1000 MCG SUBL Place 1 tablet under the tongue daily.     diphenhydramine -acetaminophen  (TYLENOL  PM) 25-500 MG TABS tablet Take 1 tablet by mouth at bedtime.     docusate sodium  (COLACE) 100 MG capsule Take 100 mg by mouth daily.     DULoxetine  (CYMBALTA ) 20 MG capsule Take 1 capsule (20 mg total) by mouth daily.     ezetimibe  (ZETIA ) 10 MG tablet Take 1 tablet  (10 mg total) by mouth daily. 90 tablet 3   finasteride  (PROSCAR ) 5 MG tablet TAKE 1 TABLET(5 MG) BY MOUTH DAILY     fluocinolone (SYNALAR) 0.025 % cream Apply topically 2 (two) times daily as needed.     furosemide  (LASIX ) 40 MG tablet Take 1 tablet (40 mg total) by mouth daily.     gabapentin  (NEURONTIN ) 300 MG capsule TAKE 2 CAPSULE BY MOUTH AT BEDTIME WITH EXTRA CAPSULE DURING THE DAY AS NEEDED     hydrocortisone  2.5 % cream APPLY TO FACE AT BEDTIME TUES, THURS, SAT 30 g 6   ketoconazole  (NIZORAL ) 2 % cream APPLY TO FACE AT BEDTIME MON, WED, FRI 60 g 3   ketoconazole  (NIZORAL ) 2 % shampoo APPLY TOPICALLY 3 TIMES WEEKLY AS DIRECTED 120 mL 2   levothyroxine  (SYNTHROID ) 175 MCG tablet Take 1 tablet (175 mcg total) by mouth daily before breakfast. 90 tablet 1   lidocaine  4 % Place 1 patch onto the skin daily. OTC     LINZESS  290 MCG CAPS capsule TAKE 1 CAPSULE(290 MCG) BY MOUTH DAILY BEFORE BREAKFAST 90 capsule 1   losartan  (COZAAR ) 25 MG tablet Take 1 tablet (25 mg total) by mouth daily. 90 tablet 4   Melatonin 10 MG CAPS Take 10 mg by mouth at bedtime.     meloxicam  (MOBIC ) 7.5  MG tablet Take 7.5 mg by mouth daily.     metoprolol  succinate (TOPROL -XL) 50 MG 24 hr tablet Take 1 tablet (50 mg total) by mouth daily.     mometasone  (ELOCON ) 0.1 % cream Apply 1 Application topically every Monday, Wednesday, and Friday. bedtime     Multiple Vitamins-Minerals (CENTRUM SILVER PO) Take 1 tablet by mouth daily.     Multiple Vitamins-Minerals (PRESERVISION AREDS 2 PO) Take 1 tablet by mouth daily.     nitroGLYCERIN  (NITROSTAT ) 0.4 MG SL tablet Place 1 tablet (0.4 mg total) under the tongue every 5 (five) minutes as needed for chest pain. 25 tablet 0   ondansetron  (ZOFRAN ) 4 MG tablet Take 4 mg by mouth every 6 (six) hours as needed for nausea.     Oxycodone  HCl 10 MG TABS Take 1 tablet (10 mg total) by mouth 2 (two) times daily as needed (moderate pain). 30 tablet 0   pantoprazole  (PROTONIX ) 40 MG tablet  Take 1 tablet (40 mg total) by mouth daily.     polyethylene glycol (MIRALAX  / GLYCOLAX ) 17 g packet Take 17 g by mouth daily.     senna (SENOKOT) 8.6 MG tablet Take 1 tablet (8.6 mg total) by mouth daily. 30 tablet 3   sucralfate  (CARAFATE ) 1 g tablet Take 1 g by mouth 2 (two) times daily.     tamsulosin  (FLOMAX ) 0.4 MG CAPS capsule Take 1 capsule (0.4 mg total) by mouth daily.     tizanidine  (ZANAFLEX ) 2 MG capsule Take 2 mg by mouth 2 (two) times daily as needed for muscle spasms.     VASCEPA  1 g capsule Take 2 capsules (2 g total) by mouth 2 (two) times daily. 360 capsule 3   Vitamin D , Cholecalciferol , 25 MCG (1000 UT) TABS Take 1 tablet by mouth daily.     No current facility-administered medications for this visit.    Past Medical History:  Diagnosis Date   Abscess in epidural space of lumbar spine 06/05/2022   Anosmia 1980s   Atrial flutter (HCC) 2018   had an ablation with dr. Chaya Cord   Basal cell carcinoma 01/11/2022   L cheek - excised 04/25/22   CAD (coronary artery disease)    Cancer (HCC)    skin cancer   Chronic constipation    Chronic insomnia    Chronic lower back pain    s/p spine stimulator placement   Coag negative Staphylococcus bacteremia    Complication of anesthesia    when under general, he woke up agitated and unable to be held down   Diastolic CHF (HCC)    Discitis of lumbosacral region    History of diverticulitis 2017   History of pneumonia 2013   Hyperlipidemia    Hypertension    Hypothyroidism    Irregular heart rhythm 07/29/2022   Obesity, Class II, BMI 35-39.9, with comorbidity    OSA on CPAP    Peptic ulcer disease 07/29/2022   PONV (postoperative nausea and vomiting)    Vertebral osteomyelitis (HCC)     Past Surgical History:  Procedure Laterality Date   A-FLUTTER ABLATION N/A 05/07/2017   Procedure: A-Flutter Ablation;  Surgeon: Verona Goodwill, MD;  Location: [facility] INVASIVE CV LAB;  Service: Cardiovascular;  Laterality: N/A;    CARDIOVASCULAR STRESS TEST  11/2017   no ischemia, low risk study   COLONOSCOPY  2007   normal per prior PCP records, rpt 10 yrs (Dr Joeline Murray)   ELBOW SURGERY Right    ulnar nerve decompression.  PT DOES NOT RECALL THIS PROCEDURE   ENDOVASCULAR REPAIR/STENT GRAFT N/A 05/05/2020   Procedure: ENDOVASCULAR REPAIR/STENT GRAFT;  Surgeon: Celso College, MD;  Location: [facility] INVASIVE CV LAB;  Service: Cardiovascular;  Laterality: N/A;   ESOPHAGOGASTRODUODENOSCOPY (EGD) WITH PROPOFOL  N/A 05/30/2022   Procedure: ESOPHAGOGASTRODUODENOSCOPY (EGD) WITH PROPOFOL ;  Surgeon: Luke Salaam, MD;  Location: [facility] ENDOSCOPY;  Service: Gastroenterology;  Laterality: N/A;   LAMINECTOMY THORACIC SPINE W/ PLACEMENT SPINAL CORD STIMULATOR  08/2018   and removal of prior spine stimulator (Dr Debrah Fan)   LUMBAR LAMINECTOMY FOR EPIDURAL ABSCESS Bilateral 06/05/2022   Procedure: LUMBAR 2- L3, L5-S1 LAMINECTOMY FOR EPIDURAL ABSCESS;  Surgeon: Jodeen Munch, MD;  Location: [facility] ORS;  Service: Neurosurgery;  Laterality: Bilateral;   NASAL SINUS SURGERY     nasal polyps. done a long time ago   PERCUTANEOUS CORONARY STENT INTERVENTION (PCI-S)  2013   EF55%, 70% mid LAD, 99% mid RCA, mild MR, elev LVEDP, DES to mid LAD. RCA is nondominant   RADIOFREQUENCY ABLATION  06/2017   lumbar region [facility])    REPLACEMENT TOTAL KNEE BILATERAL Bilateral 2000s   SPINAL CORD STIMULATOR IMPLANT  06/2016   SPINAL CORD STIMULATOR INSERTION N/A 09/09/2018   Procedure: LUMBAR SPINAL CORD STIMULATOR LEAD AND BATTERY REMOVAL AND LAMINECTOMY FOR PLACEMENT OF PADDLE;  Surgeon: Berta Brittle, MD;  Location: [facility] ORS;  Service: Neurosurgery;  Laterality: N/A;   SPINAL CORD STIMULATOR INSERTION N/A 09/16/2018   Procedure: PLACEMENT OF SPINAL CORD STIMULATOR BATTERY;  Surgeon: Berta Brittle, MD;  Location: [facility] ORS;  Service: Neurosurgery;  Laterality: N/A;   SPINAL CORD STIMULATOR REMOVAL N/A 06/03/2022   Procedure: LUMBAR SPINAL CORD STIMULATOR REMOVAL;   Surgeon: Jodeen Munch, MD;  Location: [facility] ORS;  Service: Neurosurgery;  Laterality: N/A;     Social History   Tobacco Use   Smoking status: Never    Passive exposure: Never   Smokeless tobacco: Never  Vaping Use   Vaping status: Never Used  Substance Use Topics   Alcohol use: No    Comment: QUIT DRINKING   Drug use: No      Family History  Problem Relation Age of Onset   Stroke Mother    Kidney disease Father    Leukemia Brother    Stroke Sister    Diabetes Neg Hx     Allergies  Allergen Reactions   Questran [Cholestyramine]     Patient not aware of an allergy to this medicine.   Atorvastatin     Muscle pain   Gemfibrozil Other (See Comments)    Muscle pain.   Metformin And Related     Dizziness   Rosuvastatin     Muscle pain     REVIEW OF SYSTEMS (Negative unless checked)   Constitutional: [] Weight loss  [] Fever  [] Chills Cardiac: [] Chest pain   [] Chest pressure   [] Palpitations   [] Shortness of breath when laying flat   [] Shortness of breath at rest   [] Shortness of breath with exertion. Vascular:  [] Pain in legs with walking   [] Pain in legs at rest   [] Pain in legs when laying flat   [] Claudication   [] Pain in feet when walking  [] Pain in feet at rest  [] Pain in feet when laying flat   [] History of DVT   [] Phlebitis   [] Swelling in legs   [] Varicose veins   [] Non-healing ulcers Pulmonary:   [] Uses home oxygen   [] Productive cough   [] Hemoptysis   [] Wheeze  [] COPD   [] Asthma Neurologic:  [] Dizziness  []   Blackouts   [] Seizures   [] History of stroke   [x] History of TIA  [] Aphasia   [] Temporary blindness   [] Dysphagia   [] Weakness or numbness in arms   [] Weakness or numbness in legs Musculoskeletal:  [x] Arthritis   [] Joint swelling   [x] Joint pain   [] Low back pain Hematologic:  [] Easy bruising  [] Easy bleeding   [] Hypercoagulable state   [] Anemic   Gastrointestinal:  [] Blood in stool   [] Vomiting blood  [x] Gastroesophageal reflux/heartburn   [] Abdominal  pain Genitourinary:  [] Chronic kidney disease   [] Difficult urination  [] Frequent urination  [] Burning with urination   [] Hematuria Skin:  [] Rashes   [] Ulcers   [] Wounds Psychological:  [] History of anxiety   []  History of major depression.  Physical Examination  BP (!) 167/87   Pulse 80   Ht 6\' 2"  (1.88 m)   Wt 300 lb (136.1 kg)   BMI 38.52 kg/m  Gen:  WD/WN, NAD Head: [place]/AT, No temporalis wasting. Ear/Nose/Throat: Hearing grossly intact, nares w/o erythema or drainage Eyes: Conjunctiva clear. Sclera non-icteric Neck: Supple.  Trachea midline Pulmonary:  Good air movement, no use of accessory muscles.  Cardiac: irregular Vascular:  Vessel Right Left  Radial Palpable Palpable       Musculoskeletal: M/S 5/5 throughout.  No deformity or atrophy. Mild LE edema. Neurologic: Sensation grossly intact in extremities.  Symmetrical.  Speech is fluent.  Psychiatric: Judgment intact, Mood & affect appropriate for pt's clinical situation. Dermatologic: No rashes or ulcers noted.  No cellulitis or open wounds.      Labs No results found for this or any previous visit (from the past 2160 hours).  Radiology No results found.  Assessment/Plan  Abdominal aortic aneurysm (AAA) without rupture (HCC) His duplex today shows a patent stent graft without endoleak and a maximal sac diameter of 4.5 cm.  Doing well almost 4 years status post repair.  Continue to follow on an annual basis.  Essential hypertension blood pressure control important in reducing the progression of atherosclerotic disease and aneurysmal growth. On appropriate oral medications.   Carotid stenosis Carotid duplex showed velocities in the 1 to 39% range bilaterally.  No progression from his previous studies or his CT scan from several months ago which was discussed at his last visit.  No role for intervention.  We can follow this annually.      Mikki Alexander, MD  01/08/2024 1:29 PM    This note was created with  Dragon medical transcription system.  Any errors from dictation are purely unintentional

## 2024-01-08 NOTE — Assessment & Plan Note (Signed)
blood pressure control important in reducing the progression of atherosclerotic disease and aneurysmal growth. On appropriate oral medications.  

## 2024-01-08 NOTE — Assessment & Plan Note (Signed)
 His duplex today shows a patent stent graft without endoleak and a maximal sac diameter of 4.5 cm.  Doing well almost 4 years status post repair.  Continue to follow on an annual basis.

## 2024-01-08 NOTE — Assessment & Plan Note (Signed)
 Carotid duplex showed velocities in the 1 to 39% range bilaterally.  No progression from his previous studies or his CT scan from several months ago which was discussed at his last visit.  No role for intervention.  We can follow this annually.

## 2024-01-15 ENCOUNTER — Encounter (INDEPENDENT_AMBULATORY_CARE_PROVIDER_SITE_OTHER): Payer: Self-pay

## 2024-02-18 DIAGNOSIS — B351 Tinea unguium: Secondary | ICD-10-CM | POA: Diagnosis not present

## 2024-02-18 DIAGNOSIS — I7091 Generalized atherosclerosis: Secondary | ICD-10-CM | POA: Diagnosis not present

## 2024-02-21 DIAGNOSIS — E669 Obesity, unspecified: Secondary | ICD-10-CM | POA: Diagnosis not present

## 2024-02-21 DIAGNOSIS — D649 Anemia, unspecified: Secondary | ICD-10-CM | POA: Diagnosis not present

## 2024-02-22 DIAGNOSIS — F339 Major depressive disorder, recurrent, unspecified: Secondary | ICD-10-CM | POA: Diagnosis not present

## 2024-02-22 DIAGNOSIS — G4700 Insomnia, unspecified: Secondary | ICD-10-CM | POA: Diagnosis not present

## 2024-02-25 DIAGNOSIS — R52 Pain, unspecified: Secondary | ICD-10-CM | POA: Diagnosis not present

## 2024-02-25 DIAGNOSIS — K5900 Constipation, unspecified: Secondary | ICD-10-CM | POA: Diagnosis not present

## 2024-02-25 DIAGNOSIS — E669 Obesity, unspecified: Secondary | ICD-10-CM | POA: Diagnosis not present

## 2024-02-25 DIAGNOSIS — I129 Hypertensive chronic kidney disease with stage 1 through stage 4 chronic kidney disease, or unspecified chronic kidney disease: Secondary | ICD-10-CM | POA: Diagnosis not present

## 2024-02-25 DIAGNOSIS — K267 Chronic duodenal ulcer without hemorrhage or perforation: Secondary | ICD-10-CM | POA: Diagnosis not present

## 2024-02-25 DIAGNOSIS — N401 Enlarged prostate with lower urinary tract symptoms: Secondary | ICD-10-CM | POA: Diagnosis not present

## 2024-02-25 DIAGNOSIS — N189 Chronic kidney disease, unspecified: Secondary | ICD-10-CM | POA: Diagnosis not present

## 2024-02-25 DIAGNOSIS — R269 Unspecified abnormalities of gait and mobility: Secondary | ICD-10-CM | POA: Diagnosis not present

## 2024-03-04 DIAGNOSIS — E559 Vitamin D deficiency, unspecified: Secondary | ICD-10-CM | POA: Diagnosis not present

## 2024-03-04 DIAGNOSIS — F32A Depression, unspecified: Secondary | ICD-10-CM | POA: Diagnosis not present

## 2024-03-04 DIAGNOSIS — M6250 Muscle wasting and atrophy, not elsewhere classified, unspecified site: Secondary | ICD-10-CM | POA: Diagnosis not present

## 2024-03-04 DIAGNOSIS — E66812 Obesity, class 2: Secondary | ICD-10-CM | POA: Diagnosis not present

## 2024-03-04 DIAGNOSIS — N183 Chronic kidney disease, stage 3 unspecified: Secondary | ICD-10-CM | POA: Diagnosis not present

## 2024-03-04 DIAGNOSIS — G8929 Other chronic pain: Secondary | ICD-10-CM | POA: Diagnosis not present

## 2024-03-04 DIAGNOSIS — N401 Enlarged prostate with lower urinary tract symptoms: Secondary | ICD-10-CM | POA: Diagnosis not present

## 2024-03-04 DIAGNOSIS — Z6835 Body mass index (BMI) 35.0-35.9, adult: Secondary | ICD-10-CM | POA: Diagnosis not present

## 2024-03-04 DIAGNOSIS — E039 Hypothyroidism, unspecified: Secondary | ICD-10-CM | POA: Diagnosis not present

## 2024-03-04 DIAGNOSIS — I5032 Chronic diastolic (congestive) heart failure: Secondary | ICD-10-CM | POA: Diagnosis not present

## 2024-03-04 DIAGNOSIS — D631 Anemia in chronic kidney disease: Secondary | ICD-10-CM | POA: Diagnosis not present

## 2024-03-04 DIAGNOSIS — G629 Polyneuropathy, unspecified: Secondary | ICD-10-CM | POA: Diagnosis not present

## 2024-03-04 DIAGNOSIS — K5900 Constipation, unspecified: Secondary | ICD-10-CM | POA: Diagnosis not present

## 2024-03-04 DIAGNOSIS — I129 Hypertensive chronic kidney disease with stage 1 through stage 4 chronic kidney disease, or unspecified chronic kidney disease: Secondary | ICD-10-CM | POA: Diagnosis not present

## 2024-03-04 DIAGNOSIS — N429 Disorder of prostate, unspecified: Secondary | ICD-10-CM | POA: Diagnosis not present

## 2024-03-04 DIAGNOSIS — K267 Chronic duodenal ulcer without hemorrhage or perforation: Secondary | ICD-10-CM | POA: Diagnosis not present

## 2024-03-04 DIAGNOSIS — Z7401 Bed confinement status: Secondary | ICD-10-CM | POA: Diagnosis not present

## 2024-03-04 DIAGNOSIS — E785 Hyperlipidemia, unspecified: Secondary | ICD-10-CM | POA: Diagnosis not present

## 2024-03-04 DIAGNOSIS — K219 Gastro-esophageal reflux disease without esophagitis: Secondary | ICD-10-CM | POA: Diagnosis not present

## 2024-03-04 DIAGNOSIS — E538 Deficiency of other specified B group vitamins: Secondary | ICD-10-CM | POA: Diagnosis not present

## 2024-03-04 DIAGNOSIS — Z9181 History of falling: Secondary | ICD-10-CM | POA: Diagnosis not present

## 2024-03-07 DIAGNOSIS — G8929 Other chronic pain: Secondary | ICD-10-CM | POA: Diagnosis not present

## 2024-03-07 DIAGNOSIS — I129 Hypertensive chronic kidney disease with stage 1 through stage 4 chronic kidney disease, or unspecified chronic kidney disease: Secondary | ICD-10-CM | POA: Diagnosis not present

## 2024-03-07 DIAGNOSIS — G629 Polyneuropathy, unspecified: Secondary | ICD-10-CM | POA: Diagnosis not present

## 2024-03-07 DIAGNOSIS — I5032 Chronic diastolic (congestive) heart failure: Secondary | ICD-10-CM | POA: Diagnosis not present

## 2024-03-07 DIAGNOSIS — D631 Anemia in chronic kidney disease: Secondary | ICD-10-CM | POA: Diagnosis not present

## 2024-03-07 DIAGNOSIS — N183 Chronic kidney disease, stage 3 unspecified: Secondary | ICD-10-CM | POA: Diagnosis not present

## 2024-03-10 DIAGNOSIS — G8929 Other chronic pain: Secondary | ICD-10-CM | POA: Diagnosis not present

## 2024-03-10 DIAGNOSIS — G629 Polyneuropathy, unspecified: Secondary | ICD-10-CM | POA: Diagnosis not present

## 2024-03-10 DIAGNOSIS — I5032 Chronic diastolic (congestive) heart failure: Secondary | ICD-10-CM | POA: Diagnosis not present

## 2024-03-10 DIAGNOSIS — D631 Anemia in chronic kidney disease: Secondary | ICD-10-CM | POA: Diagnosis not present

## 2024-03-10 DIAGNOSIS — I129 Hypertensive chronic kidney disease with stage 1 through stage 4 chronic kidney disease, or unspecified chronic kidney disease: Secondary | ICD-10-CM | POA: Diagnosis not present

## 2024-03-10 DIAGNOSIS — N183 Chronic kidney disease, stage 3 unspecified: Secondary | ICD-10-CM | POA: Diagnosis not present

## 2024-03-13 ENCOUNTER — Ambulatory Visit: Payer: Self-pay | Admitting: Urology

## 2024-03-17 DIAGNOSIS — G629 Polyneuropathy, unspecified: Secondary | ICD-10-CM | POA: Diagnosis not present

## 2024-03-17 DIAGNOSIS — N183 Chronic kidney disease, stage 3 unspecified: Secondary | ICD-10-CM | POA: Diagnosis not present

## 2024-03-17 DIAGNOSIS — I129 Hypertensive chronic kidney disease with stage 1 through stage 4 chronic kidney disease, or unspecified chronic kidney disease: Secondary | ICD-10-CM | POA: Diagnosis not present

## 2024-03-17 DIAGNOSIS — G8929 Other chronic pain: Secondary | ICD-10-CM | POA: Diagnosis not present

## 2024-03-17 DIAGNOSIS — I5032 Chronic diastolic (congestive) heart failure: Secondary | ICD-10-CM | POA: Diagnosis not present

## 2024-03-17 DIAGNOSIS — E559 Vitamin D deficiency, unspecified: Secondary | ICD-10-CM | POA: Diagnosis not present

## 2024-03-17 DIAGNOSIS — Z79899 Other long term (current) drug therapy: Secondary | ICD-10-CM | POA: Diagnosis not present

## 2024-03-17 DIAGNOSIS — D631 Anemia in chronic kidney disease: Secondary | ICD-10-CM | POA: Diagnosis not present

## 2024-03-19 DIAGNOSIS — K5900 Constipation, unspecified: Secondary | ICD-10-CM | POA: Diagnosis not present

## 2024-03-19 DIAGNOSIS — R269 Unspecified abnormalities of gait and mobility: Secondary | ICD-10-CM | POA: Diagnosis not present

## 2024-03-19 DIAGNOSIS — E782 Mixed hyperlipidemia: Secondary | ICD-10-CM | POA: Diagnosis not present

## 2024-03-19 DIAGNOSIS — N182 Chronic kidney disease, stage 2 (mild): Secondary | ICD-10-CM | POA: Diagnosis not present

## 2024-03-19 DIAGNOSIS — I6529 Occlusion and stenosis of unspecified carotid artery: Secondary | ICD-10-CM | POA: Diagnosis not present

## 2024-03-19 DIAGNOSIS — I25119 Atherosclerotic heart disease of native coronary artery with unspecified angina pectoris: Secondary | ICD-10-CM | POA: Diagnosis not present

## 2024-03-19 DIAGNOSIS — Z79899 Other long term (current) drug therapy: Secondary | ICD-10-CM | POA: Diagnosis not present

## 2024-03-19 DIAGNOSIS — Z6841 Body Mass Index (BMI) 40.0 and over, adult: Secondary | ICD-10-CM | POA: Diagnosis not present

## 2024-03-19 DIAGNOSIS — E559 Vitamin D deficiency, unspecified: Secondary | ICD-10-CM | POA: Diagnosis not present

## 2024-03-19 DIAGNOSIS — I129 Hypertensive chronic kidney disease with stage 1 through stage 4 chronic kidney disease, or unspecified chronic kidney disease: Secondary | ICD-10-CM | POA: Diagnosis not present

## 2024-03-19 DIAGNOSIS — I714 Abdominal aortic aneurysm, without rupture, unspecified: Secondary | ICD-10-CM | POA: Diagnosis not present

## 2024-03-20 ENCOUNTER — Ambulatory Visit: Admitting: Dermatology

## 2024-03-21 DIAGNOSIS — G4700 Insomnia, unspecified: Secondary | ICD-10-CM | POA: Diagnosis not present

## 2024-03-21 DIAGNOSIS — F330 Major depressive disorder, recurrent, mild: Secondary | ICD-10-CM | POA: Diagnosis not present

## 2024-03-24 DIAGNOSIS — G8929 Other chronic pain: Secondary | ICD-10-CM | POA: Diagnosis not present

## 2024-03-24 DIAGNOSIS — N183 Chronic kidney disease, stage 3 unspecified: Secondary | ICD-10-CM | POA: Diagnosis not present

## 2024-03-24 DIAGNOSIS — D631 Anemia in chronic kidney disease: Secondary | ICD-10-CM | POA: Diagnosis not present

## 2024-03-24 DIAGNOSIS — G629 Polyneuropathy, unspecified: Secondary | ICD-10-CM | POA: Diagnosis not present

## 2024-03-24 DIAGNOSIS — I5032 Chronic diastolic (congestive) heart failure: Secondary | ICD-10-CM | POA: Diagnosis not present

## 2024-03-24 DIAGNOSIS — I129 Hypertensive chronic kidney disease with stage 1 through stage 4 chronic kidney disease, or unspecified chronic kidney disease: Secondary | ICD-10-CM | POA: Diagnosis not present

## 2024-03-25 DIAGNOSIS — E669 Obesity, unspecified: Secondary | ICD-10-CM | POA: Diagnosis not present

## 2024-03-25 DIAGNOSIS — D649 Anemia, unspecified: Secondary | ICD-10-CM | POA: Diagnosis not present

## 2024-03-26 DIAGNOSIS — I129 Hypertensive chronic kidney disease with stage 1 through stage 4 chronic kidney disease, or unspecified chronic kidney disease: Secondary | ICD-10-CM | POA: Diagnosis not present

## 2024-03-26 DIAGNOSIS — N182 Chronic kidney disease, stage 2 (mild): Secondary | ICD-10-CM | POA: Diagnosis not present

## 2024-03-26 DIAGNOSIS — E559 Vitamin D deficiency, unspecified: Secondary | ICD-10-CM | POA: Diagnosis not present

## 2024-03-26 DIAGNOSIS — E782 Mixed hyperlipidemia: Secondary | ICD-10-CM | POA: Diagnosis not present

## 2024-03-31 DIAGNOSIS — G8929 Other chronic pain: Secondary | ICD-10-CM | POA: Diagnosis not present

## 2024-03-31 DIAGNOSIS — I5032 Chronic diastolic (congestive) heart failure: Secondary | ICD-10-CM | POA: Diagnosis not present

## 2024-03-31 DIAGNOSIS — D631 Anemia in chronic kidney disease: Secondary | ICD-10-CM | POA: Diagnosis not present

## 2024-03-31 DIAGNOSIS — G629 Polyneuropathy, unspecified: Secondary | ICD-10-CM | POA: Diagnosis not present

## 2024-03-31 DIAGNOSIS — I129 Hypertensive chronic kidney disease with stage 1 through stage 4 chronic kidney disease, or unspecified chronic kidney disease: Secondary | ICD-10-CM | POA: Diagnosis not present

## 2024-03-31 DIAGNOSIS — N183 Chronic kidney disease, stage 3 unspecified: Secondary | ICD-10-CM | POA: Diagnosis not present

## 2024-04-03 DIAGNOSIS — M6250 Muscle wasting and atrophy, not elsewhere classified, unspecified site: Secondary | ICD-10-CM | POA: Diagnosis not present

## 2024-04-03 DIAGNOSIS — I5032 Chronic diastolic (congestive) heart failure: Secondary | ICD-10-CM | POA: Diagnosis not present

## 2024-04-03 DIAGNOSIS — G8929 Other chronic pain: Secondary | ICD-10-CM | POA: Diagnosis not present

## 2024-04-03 DIAGNOSIS — F32A Depression, unspecified: Secondary | ICD-10-CM | POA: Diagnosis not present

## 2024-04-03 DIAGNOSIS — I129 Hypertensive chronic kidney disease with stage 1 through stage 4 chronic kidney disease, or unspecified chronic kidney disease: Secondary | ICD-10-CM | POA: Diagnosis not present

## 2024-04-03 DIAGNOSIS — Z9181 History of falling: Secondary | ICD-10-CM | POA: Diagnosis not present

## 2024-04-03 DIAGNOSIS — E538 Deficiency of other specified B group vitamins: Secondary | ICD-10-CM | POA: Diagnosis not present

## 2024-04-03 DIAGNOSIS — K267 Chronic duodenal ulcer without hemorrhage or perforation: Secondary | ICD-10-CM | POA: Diagnosis not present

## 2024-04-03 DIAGNOSIS — D631 Anemia in chronic kidney disease: Secondary | ICD-10-CM | POA: Diagnosis not present

## 2024-04-03 DIAGNOSIS — E039 Hypothyroidism, unspecified: Secondary | ICD-10-CM | POA: Diagnosis not present

## 2024-04-03 DIAGNOSIS — K219 Gastro-esophageal reflux disease without esophagitis: Secondary | ICD-10-CM | POA: Diagnosis not present

## 2024-04-03 DIAGNOSIS — N429 Disorder of prostate, unspecified: Secondary | ICD-10-CM | POA: Diagnosis not present

## 2024-04-03 DIAGNOSIS — K5900 Constipation, unspecified: Secondary | ICD-10-CM | POA: Diagnosis not present

## 2024-04-03 DIAGNOSIS — E559 Vitamin D deficiency, unspecified: Secondary | ICD-10-CM | POA: Diagnosis not present

## 2024-04-03 DIAGNOSIS — E66812 Obesity, class 2: Secondary | ICD-10-CM | POA: Diagnosis not present

## 2024-04-03 DIAGNOSIS — Z6835 Body mass index (BMI) 35.0-35.9, adult: Secondary | ICD-10-CM | POA: Diagnosis not present

## 2024-04-03 DIAGNOSIS — G629 Polyneuropathy, unspecified: Secondary | ICD-10-CM | POA: Diagnosis not present

## 2024-04-03 DIAGNOSIS — E785 Hyperlipidemia, unspecified: Secondary | ICD-10-CM | POA: Diagnosis not present

## 2024-04-03 DIAGNOSIS — Z7401 Bed confinement status: Secondary | ICD-10-CM | POA: Diagnosis not present

## 2024-04-03 DIAGNOSIS — N401 Enlarged prostate with lower urinary tract symptoms: Secondary | ICD-10-CM | POA: Diagnosis not present

## 2024-04-03 DIAGNOSIS — N183 Chronic kidney disease, stage 3 unspecified: Secondary | ICD-10-CM | POA: Diagnosis not present

## 2024-04-07 DIAGNOSIS — N183 Chronic kidney disease, stage 3 unspecified: Secondary | ICD-10-CM | POA: Diagnosis not present

## 2024-04-07 DIAGNOSIS — I5032 Chronic diastolic (congestive) heart failure: Secondary | ICD-10-CM | POA: Diagnosis not present

## 2024-04-07 DIAGNOSIS — I129 Hypertensive chronic kidney disease with stage 1 through stage 4 chronic kidney disease, or unspecified chronic kidney disease: Secondary | ICD-10-CM | POA: Diagnosis not present

## 2024-04-07 DIAGNOSIS — G8929 Other chronic pain: Secondary | ICD-10-CM | POA: Diagnosis not present

## 2024-04-07 DIAGNOSIS — G629 Polyneuropathy, unspecified: Secondary | ICD-10-CM | POA: Diagnosis not present

## 2024-04-07 DIAGNOSIS — D631 Anemia in chronic kidney disease: Secondary | ICD-10-CM | POA: Diagnosis not present

## 2024-04-08 ENCOUNTER — Ambulatory Visit: Admitting: Dermatology

## 2024-04-08 DIAGNOSIS — B353 Tinea pedis: Secondary | ICD-10-CM

## 2024-04-08 DIAGNOSIS — Z7189 Other specified counseling: Secondary | ICD-10-CM | POA: Diagnosis not present

## 2024-04-08 DIAGNOSIS — Z85828 Personal history of other malignant neoplasm of skin: Secondary | ICD-10-CM | POA: Diagnosis not present

## 2024-04-08 DIAGNOSIS — L820 Inflamed seborrheic keratosis: Secondary | ICD-10-CM

## 2024-04-08 DIAGNOSIS — L918 Other hypertrophic disorders of the skin: Secondary | ICD-10-CM

## 2024-04-08 DIAGNOSIS — L408 Other psoriasis: Secondary | ICD-10-CM

## 2024-04-08 DIAGNOSIS — L720 Epidermal cyst: Secondary | ICD-10-CM | POA: Diagnosis not present

## 2024-04-08 DIAGNOSIS — Z1283 Encounter for screening for malignant neoplasm of skin: Secondary | ICD-10-CM | POA: Diagnosis not present

## 2024-04-08 DIAGNOSIS — W908XXA Exposure to other nonionizing radiation, initial encounter: Secondary | ICD-10-CM | POA: Diagnosis not present

## 2024-04-08 DIAGNOSIS — L570 Actinic keratosis: Secondary | ICD-10-CM | POA: Diagnosis not present

## 2024-04-08 DIAGNOSIS — L729 Follicular cyst of the skin and subcutaneous tissue, unspecified: Secondary | ICD-10-CM

## 2024-04-08 DIAGNOSIS — L578 Other skin changes due to chronic exposure to nonionizing radiation: Secondary | ICD-10-CM

## 2024-04-08 DIAGNOSIS — Z79899 Other long term (current) drug therapy: Secondary | ICD-10-CM

## 2024-04-08 DIAGNOSIS — L219 Seborrheic dermatitis, unspecified: Secondary | ICD-10-CM | POA: Diagnosis not present

## 2024-04-08 MED ORDER — KETOCONAZOLE 2 % EX CREA: TOPICAL_CREAM | CUTANEOUS | 11 refills | Status: AC

## 2024-04-08 MED ORDER — HYDROCORTISONE 2.5 % EX CREA: TOPICAL_CREAM | CUTANEOUS | 11 refills | Status: AC

## 2024-04-08 MED ORDER — MOMETASONE FUROATE 0.1 % EX SOLN: CUTANEOUS | 6 refills | Status: AC

## 2024-04-08 MED ORDER — KETOCONAZOLE 2 % EX SHAM: MEDICATED_SHAMPOO | CUTANEOUS | 11 refills | Status: AC

## 2024-04-08 NOTE — Patient Instructions (Addendum)
 Seborrheic Dermatitis is a chronic persistent rash characterized by pinkness and scaling most commonly of the mid face but also can occur on the scalp (dandruff), ears; mid chest, mid back and groin.  It tends to be exacerbated by stress and cooler weather.  People who have neurologic disease may experience new onset or exacerbation of existing seborrheic dermatitis.  The condition is not curable but treatable and can be controlled.   For face  Continue Ketoconazole  cream apply to face at bedtime Mon, Wed, Fri  Continue Hydrocortisone  cream apply to face at bedtime Tues, Thurs, Sat   For scalp and behind ears  Start ketoconazole  shampoo - apply three times per week, massage and lather into scalp and behind ears and leave in for 10 minutes before rinsing out  on Monday, Wed, Friday  Start mometasone  solution - apply topically to areas behind ears and scalp nightly and leave on over night      Seborrheic Keratosis  What causes seborrheic keratoses? Seborrheic keratoses are harmless, common skin growths that first appear during adult life.  As time goes by, more growths appear.  Some people may develop a large number of them.  Seborrheic keratoses appear on both covered and uncovered body parts.  They are not caused by sunlight.  The tendency to develop seborrheic keratoses can be inherited.  They vary in color from skin-colored to gray, brown, or even black.  They can be either smooth or have a rough, warty surface.   Seborrheic keratoses are superficial and look as if they were stuck on the skin.  Under the microscope this type of keratosis looks like layers upon layers of skin.  That is why at times the top layer may seem to fall off, but the rest of the growth remains and re-grows.    Treatment Seborrheic keratoses do not need to be treated, but can easily be removed in the office.  Seborrheic keratoses often cause symptoms when they rub on clothing or jewelry.  Lesions can be in the way of  shaving.  If they become inflamed, they can cause itching, soreness, or burning.  Removal of a seborrheic keratosis can be accomplished by freezing, burning, or surgery. If any spot bleeds, scabs, or grows rapidly, please return to have it checked, as these can be an indication of a skin cancer.  Cryotherapy Aftercare  Wash gently with soap and water everyday.   Apply Vaseline and Band-Aid daily until healed.   Actinic keratoses are precancerous spots that appear secondary to cumulative UV radiation exposure/sun exposure over time. They are chronic with expected duration over 1 year. A portion of actinic keratoses will progress to squamous cell carcinoma of the skin. It is not possible to reliably predict which spots will progress to skin cancer and so treatment is recommended to prevent development of skin cancer.  Recommend daily broad spectrum sunscreen SPF 30+ to sun-exposed areas, reapply every 2 hours as needed.  Recommend staying in the shade or wearing long sleeves, sun glasses (UVA+UVB protection) and wide brim hats (4-inch brim around the entire circumference of the hat). Call for new or changing lesions.     Melanoma ABCDEs  Melanoma is the most dangerous type of skin cancer, and is the leading cause of death from skin disease.  You are more likely to develop melanoma if you: Have light-colored skin, light-colored eyes, or red or blond hair Spend a lot of time in the sun Tan regularly, either outdoors or in a tanning bed  Have had blistering sunburns, especially during childhood Have a close family member who has had a melanoma Have atypical moles or large birthmarks  Early detection of melanoma is key since treatment is typically straightforward and cure rates are extremely high if we catch it early.   The first sign of melanoma is often a change in a mole or a new dark spot.  The ABCDE system is a way of remembering the signs of melanoma.  A for asymmetry:  The two halves do  not match. B for border:  The edges of the growth are irregular. C for color:  A mixture of colors are present instead of an even brown color. D for diameter:  Melanomas are usually (but not always) greater than 6mm - the size of a pencil eraser. E for evolution:  The spot keeps changing in size, shape, and color.  Please check your skin once per month between visits. You can use a small mirror in front and a large mirror behind you to keep an eye on the back side or your body.   If you see any new or changing lesions before your next follow-up, please call to schedule a visit.  Please continue daily skin protection including broad spectrum sunscreen SPF 30+ to sun-exposed areas, reapplying every 2 hours as needed when you're outdoors.   Staying in the shade or wearing long sleeves, sun glasses (UVA+UVB protection) and wide brim hats (4-inch brim around the entire circumference of the hat) are also recommended for sun protection.    Due to recent changes in healthcare laws, you may see results of your pathology and/or laboratory studies on MyChart before the doctors have had a chance to review them. We understand that in some cases there may be results that are confusing or concerning to you. Please understand that not all results are received at the same time and often the doctors may need to interpret multiple results in order to provide you with the best plan of care or course of treatment. Therefore, we ask that you please give us  2 business days to thoroughly review all your results before contacting the office for clarification. Should we see a critical lab result, you will be contacted sooner.   If You Need Anything After Your Visit  If you have any questions or concerns for your doctor, please call our main line at [phone] and press option 4 to reach your doctor's medical assistant. If no one answers, please leave a voicemail as directed and we will return your call as soon as  possible. Messages left after 4 pm will be answered the following business day.   You may also send us  a message via MyChart. We typically respond to MyChart messages within 1-2 business days.  For prescription refills, please ask your pharmacy to contact our office. Our fax number is [phone].  If you have an urgent issue when the clinic is closed that cannot wait until the next business day, you can page your doctor at the number below.    Please note that while we do our best to be available for urgent issues outside of office hours, we are not available 24/7.   If you have an urgent issue and are unable to reach us , you may choose to seek medical care at your doctor's office, retail clinic, urgent care center, or emergency room.  If you have a medical emergency, please immediately call 911 or go to the emergency department.  Pager Numbers  -  Dr. Hester: [phone]  - Dr. Jackquline: [phone]  - Dr. Claudene: [phone]   In the event of inclement weather, please call our main line at [phone] for an update on the status of any delays or closures.  Dermatology Medication Tips: Please keep the boxes that topical medications come in in order to help keep track of the instructions about where and how to use these. Pharmacies typically print the medication instructions only on the boxes and not directly on the medication tubes.   If your medication is too expensive, please contact our office at [phone] option 4 or send us  a message through MyChart.   We are unable to tell what your co-pay for medications will be in advance as this is different depending on your insurance coverage. However, we may be able to find a substitute medication at lower cost or fill out paperwork to get insurance to cover a needed medication.   If a prior authorization is required to get your medication covered by your insurance company, please allow us  1-2 business days to complete this  process.  Drug prices often vary depending on where the prescription is filled and some pharmacies may offer cheaper prices.  The website www.goodrx.com contains coupons for medications through different pharmacies. The prices here do not account for what the cost may be with help from insurance (it may be cheaper with your insurance), but the website can give you the price if you did not use any insurance.  - You can print the associated coupon and take it with your prescription to the pharmacy.  - You may also stop by our office during regular business hours and pick up a GoodRx coupon card.  - If you need your prescription sent electronically to a different pharmacy, notify our office through [facility] or by phone at [phone] option 4.     Si Usted Necesita Algo Despus de Su Visita  Tambin puede enviarnos un mensaje a travs de Clinical cytogeneticist. Por lo general respondemos a los mensajes de MyChart en el transcurso de 1 a 2 das hbiles.  Para renovar recetas, por favor pida a su farmacia que se ponga en contacto con nuestra oficina. Randi lakes de fax es [place] [phone].  Si tiene un asunto urgente cuando la clnica est cerrada y que no puede esperar hasta el siguiente da hbil, puede llamar/localizar a su doctor(a) al nmero que aparece a continuacin.   Por favor, tenga en cuenta que aunque hacemos todo lo posible para estar disponibles para asuntos urgentes fuera del horario de [place], no estamos disponibles las 24 horas del da, los 7 [address]  [address] de la [place].   Si tiene un problema urgente y no puede comunicarse con nosotros, puede optar por buscar atencin mdica  en el consultorio de su doctor(a), en una clnica privada, en un centro de atencin urgente o en una sala de emergencias.  Si tiene Engineer, drilling, por favor llame inmediatamente al 911 o vaya a la sala de emergencias.  Nmeros de bper  - Dr. Hester: [phone]  - Dra. Jackquline: [phone]  - Dr.  Claudene: [phone]   En caso de inclemencias del tiempo, por favor llame a landry capes principal al [phone] para una actualizacin sobre el [place] de cualquier retraso o cierre.  Consejos para la medicacin en dermatologa: Por favor, guarde las cajas en las que vienen los medicamentos de uso tpico para ayudarle a seguir las instrucciones sobre dnde y cmo usarlos. Las farmacias generalmente imprimen las instrucciones  del medicamento slo en las cajas y no directamente en los tubos del medicamento.   Si su medicamento es muy caro, por favor, pngase en contacto con landry rieger llamando al [phone] y presione la opcin 4 o envenos un mensaje a travs de Clinical cytogeneticist.   No podemos decirle cul ser su copago por los medicamentos por adelantado ya que esto es diferente dependiendo de la cobertura de su seguro. Sin embargo, es posible que podamos encontrar un medicamento sustituto a Audiological scientist un formulario para que el seguro cubra el medicamento que se considera necesario.   Si se requiere una autorizacin previa para que su compaa de seguros malta su medicamento, por favor permtanos de 1 a 2 das hbiles para completar este proceso.  Los precios de los medicamentos varan con frecuencia dependiendo del Environmental consultant de dnde se surte la receta y alguna farmacias pueden ofrecer precios ms baratos.  El sitio web www.goodrx.com tiene cupones para medicamentos de Health and safety inspector. Los precios aqu no tienen en cuenta lo que podra costar con la ayuda del seguro (puede ser ms barato con su seguro), pero el sitio web puede darle el precio si no utiliz Tourist information centre manager.  - Puede imprimir el cupn correspondiente y llevarlo con su receta a la farmacia.  - Tambin puede pasar por nuestra oficina durante el horario de atencin regular y Education officer, museum una tarjeta de cupones de GoodRx.  - Si necesita que su receta se enve electrnicamente a una farmacia diferente, informe a nuestra oficina a  travs de MyChart de [place] o por telfono llamando al [phone] y presione la opcin 4.

## 2024-04-08 NOTE — Progress Notes (Signed)
 Follow-Up Visit   Subjective  Anthony [name] is a 88 y.o. male who presents for the following: Skin Cancer Screening and Full Body Skin Exam Hx of bcc, hx of isk and aks, hx of seb derm at face has used ketoconazole  cream and hc cream  Some rough spots at arms   The patient presents for Total-Body Skin Exam (TBSE) for skin cancer screening and mole check. The patient has spots, moles and lesions to be evaluated, some may be new or changing and the patient may have concern these could be cancer.  The following portions of the chart were reviewed this encounter and updated as appropriate: medications, allergies, medical history  Review of Systems:  No other skin or systemic complaints except as noted in HPI or Assessment and Plan.  Objective  Well appearing patient in no apparent distress; mood and affect are within normal limits.  A full examination was performed including scalp, head, eyes, ears, nose, lips, neck, chest, axillae, abdomen, back, buttocks, bilateral upper extremities, bilateral lower extremities, hands, feet, fingers, toes, fingernails, and toenails. All findings within normal limits unless otherwise noted below.   Relevant physical exam findings are noted in the Assessment and Plan.  face and nose x 15 (15) Erythematous thin papules/macules with gritty scale.  b/l arms and hands x 21 (21) Erythematous stuck-on, waxy papule or plaque  Assessment & Plan    SEBORRHEIC DERMATITIS Exam: Pink patches with greasy scale at face, behind ears, and scalp Chronic and persistent condition with duration or expected duration over one year. Condition is symptomatic/ bothersome to patient. Not currently at goal. Seborrheic Dermatitis is a chronic persistent rash characterized by pinkness and scaling most commonly of the mid face but also can occur on the scalp (dandruff), ears; mid chest, mid back and groin.  It tends to be exacerbated by stress and cooler weather.  People who have  neurologic disease may experience new onset or exacerbation of existing seborrheic dermatitis.  The condition is not curable but treatable and can be controlled. Treatment Plan: For Face  Continue Ketoconazole  cream apply to face at bedtime Mon, Wed, Fri  Continue Hydrocortisone  cream apply to face at bedtime Tues, Thurs, Sat   For scalp and behind ears  Start Ketoconazole  shampoo - apply three times per week, massage and lather into scalp and behind ears and leave in for 10 minutes before rinsing out  on Monday, Wed, Friday  Start Mometasone  0.1% solution - apply topically to areas behind ears and scalp nightly and leave on over night   SKIN CANCER SCREENING PERFORMED TODAY.  ACTINIC DAMAGE - Chronic condition, secondary to cumulative UV/sun exposure - diffuse scaly erythematous macules with underlying dyspigmentation - Recommend daily broad spectrum sunscreen SPF 30+ to sun-exposed areas, reapply every 2 hours as needed.  - Staying in the shade or wearing long sleeves, sun glasses (UVA+UVB protection) and wide brim hats (4-inch brim around the entire circumference of the hat) are also recommended for sun protection.  - Call for new or changing lesions.  LENTIGINES, SEBORRHEIC KERATOSES, HEMANGIOMAS - Benign normal skin lesions - Benign-appearing - Call for any changes  MELANOCYTIC NEVI - Tan-brown and/or pink-flesh-colored symmetric macules and papules - Benign appearing on exam today - Observation - Call clinic for new or changing moles - Recommend daily use of broad spectrum spf 30+ sunscreen to sun-exposed areas.   Acrochordons (Skin Tags) - Fleshy, skin-colored pedunculated papules - Benign appearing.  - Observe. - If desired, they can  be removed with an in office procedure that is not covered by insurance. - Please call the clinic if you notice any new or changing lesions.  EPIDERMAL INCLUSION CYST Exam: Subcutaneous nodule at 0.6 cm mid back Benign-appearing. Exam most  consistent with an epidermal inclusion cyst. Discussed that a cyst is a benign growth that can grow over time and sometimes get irritated or inflamed. Recommend observation if it is not bothersome. Discussed option of surgical excision to remove it if it is growing, symptomatic, or other changes noted. Please call for new or changing lesions so they can be evaluated.  History of Basal Cell Carcinoma of the Skin Left cheek 01/11/2022 - No evidence of recurrence today - Recommend regular full body skin exams - Recommend daily broad spectrum sunscreen SPF 30+ to sun-exposed areas, reapply every 2 hours as needed.  - Call if any new or changing lesions are noted between office visits   ACTINIC KERATOSIS (15) face and nose x 15 (15) Actinic keratoses are precancerous spots that appear secondary to cumulative UV radiation exposure/sun exposure over time. They are chronic with expected duration over 1 year. A portion of actinic keratoses will progress to squamous cell carcinoma of the skin. It is not possible to reliably predict which spots will progress to skin cancer and so treatment is recommended to prevent development of skin cancer.  Recommend daily broad spectrum sunscreen SPF 30+ to sun-exposed areas, reapply every 2 hours as needed.  Recommend staying in the shade or wearing long sleeves, sun glasses (UVA+UVB protection) and wide brim hats (4-inch brim around the entire circumference of the hat). Call for new or changing lesions. Destruction of lesion - face and nose x 15 (15) Complexity: simple   Destruction method: cryotherapy   Informed consent: discussed and consent obtained   Timeout:  patient name, date of birth, surgical site, and procedure verified Lesion destroyed using liquid nitrogen: Yes   Region frozen until ice ball extended beyond lesion: Yes   Outcome: patient tolerated procedure well with no complications   Post-procedure details: wound care instructions given    INFLAMED  SEBORRHEIC KERATOSIS (21) b/l arms and hands x 21 (21) Symptomatic, irritating, patient would like treated. Destruction of lesion - b/l arms and hands x 21 (21) Complexity: simple   Destruction method: cryotherapy   Informed consent: discussed and consent obtained   Timeout:  patient name, date of birth, surgical site, and procedure verified Lesion destroyed using liquid nitrogen: Yes   Region frozen until ice ball extended beyond lesion: Yes   Outcome: patient tolerated procedure well with no complications   Post-procedure details: wound care instructions given    SEBORRHEIC DERMATITIS   Related Medications ketoconazole  (NIZORAL ) 2 % shampoo apply three times per week, massage and lather into scalp and behind ears and leave in for 10 minutes before rinsing out  on Monday, Wed, Friday for seborrheic dermatitis ketoconazole  (NIZORAL ) 2 % cream APPLY TO FACE AT BEDTIME MON, WED, FRI for seborrheic dermatitis hydrocortisone  2.5 % cream APPLY TO FACE AT BEDTIME TUES, THURS, SAT for seborrheic dermatitis mometasone  (ELOCON ) 0.1 % lotion apply topically to areas behind ears and scalp nightly and leave on over night for seborrheic dermatitis TINEA PEDIS OF BOTH FEET   Related Medications ciclopirox  (PENLAC ) 8 % solution APPLY TOPICALLY AT BEDTIME APPLY OVER NAIL AND SURROUNDING SKIN, APPLY DAILY OVER PREVIOUS COAT, AFTER 7 DAYS MAY REMOVE WITH ALCOHOL AND REPEAT ketoconazole  (NIZORAL ) 2 % cream APPLY TO FACE AT BEDTIME MON,  WED, FRI for seborrheic dermatitis SEBOPSORIASIS   Related Medications ketoconazole  (NIZORAL ) 2 % shampoo apply three times per week, massage and lather into scalp and behind ears and leave in for 10 minutes before rinsing out  on Monday, Wed, Friday for seborrheic dermatitis mometasone  (ELOCON ) 0.1 % lotion apply topically to areas behind ears and scalp nightly and leave on over night for seborrheic dermatitis Return in about 1 year (around 04/08/2025) for ubse  hx of bcc .  IEleanor Blush, CMA, am acting as scribe for Alm Rhyme, MD.   Documentation: I have reviewed the above documentation for accuracy and completeness, and I agree with the above.  Alm Rhyme, MD

## 2024-04-09 ENCOUNTER — Encounter: Payer: Self-pay | Admitting: Dermatology

## 2024-04-09 DIAGNOSIS — I25119 Atherosclerotic heart disease of native coronary artery with unspecified angina pectoris: Secondary | ICD-10-CM | POA: Diagnosis not present

## 2024-04-09 DIAGNOSIS — I129 Hypertensive chronic kidney disease with stage 1 through stage 4 chronic kidney disease, or unspecified chronic kidney disease: Secondary | ICD-10-CM | POA: Diagnosis not present

## 2024-04-09 DIAGNOSIS — I714 Abdominal aortic aneurysm, without rupture, unspecified: Secondary | ICD-10-CM | POA: Diagnosis not present

## 2024-04-09 DIAGNOSIS — Z79899 Other long term (current) drug therapy: Secondary | ICD-10-CM | POA: Diagnosis not present

## 2024-04-09 DIAGNOSIS — E782 Mixed hyperlipidemia: Secondary | ICD-10-CM | POA: Diagnosis not present

## 2024-04-09 DIAGNOSIS — R269 Unspecified abnormalities of gait and mobility: Secondary | ICD-10-CM | POA: Diagnosis not present

## 2024-04-09 DIAGNOSIS — N182 Chronic kidney disease, stage 2 (mild): Secondary | ICD-10-CM | POA: Diagnosis not present

## 2024-04-16 DIAGNOSIS — L219 Seborrheic dermatitis, unspecified: Secondary | ICD-10-CM | POA: Diagnosis not present

## 2024-04-16 DIAGNOSIS — R2681 Unsteadiness on feet: Secondary | ICD-10-CM | POA: Diagnosis not present

## 2024-04-16 DIAGNOSIS — G8929 Other chronic pain: Secondary | ICD-10-CM | POA: Diagnosis not present

## 2024-04-16 DIAGNOSIS — E782 Mixed hyperlipidemia: Secondary | ICD-10-CM | POA: Diagnosis not present

## 2024-04-16 DIAGNOSIS — R4189 Other symptoms and signs involving cognitive functions and awareness: Secondary | ICD-10-CM | POA: Diagnosis not present

## 2024-04-16 DIAGNOSIS — I714 Abdominal aortic aneurysm, without rupture, unspecified: Secondary | ICD-10-CM | POA: Diagnosis not present

## 2024-04-16 DIAGNOSIS — G629 Polyneuropathy, unspecified: Secondary | ICD-10-CM | POA: Diagnosis not present

## 2024-04-16 DIAGNOSIS — R239 Unspecified skin changes: Secondary | ICD-10-CM | POA: Diagnosis not present

## 2024-04-16 DIAGNOSIS — N182 Chronic kidney disease, stage 2 (mild): Secondary | ICD-10-CM | POA: Diagnosis not present

## 2024-04-16 DIAGNOSIS — D631 Anemia in chronic kidney disease: Secondary | ICD-10-CM | POA: Diagnosis not present

## 2024-04-16 DIAGNOSIS — I2510 Atherosclerotic heart disease of native coronary artery without angina pectoris: Secondary | ICD-10-CM | POA: Diagnosis not present

## 2024-04-16 DIAGNOSIS — I129 Hypertensive chronic kidney disease with stage 1 through stage 4 chronic kidney disease, or unspecified chronic kidney disease: Secondary | ICD-10-CM | POA: Diagnosis not present

## 2024-04-16 DIAGNOSIS — I5032 Chronic diastolic (congestive) heart failure: Secondary | ICD-10-CM | POA: Diagnosis not present

## 2024-04-16 DIAGNOSIS — N183 Chronic kidney disease, stage 3 unspecified: Secondary | ICD-10-CM | POA: Diagnosis not present

## 2024-04-16 DIAGNOSIS — I1310 Hypertensive heart and chronic kidney disease without heart failure, with stage 1 through stage 4 chronic kidney disease, or unspecified chronic kidney disease: Secondary | ICD-10-CM | POA: Diagnosis not present

## 2024-04-18 DIAGNOSIS — G4700 Insomnia, unspecified: Secondary | ICD-10-CM | POA: Diagnosis not present

## 2024-04-18 DIAGNOSIS — F339 Major depressive disorder, recurrent, unspecified: Secondary | ICD-10-CM | POA: Diagnosis not present

## 2024-04-21 DIAGNOSIS — I129 Hypertensive chronic kidney disease with stage 1 through stage 4 chronic kidney disease, or unspecified chronic kidney disease: Secondary | ICD-10-CM | POA: Diagnosis not present

## 2024-04-21 DIAGNOSIS — G8929 Other chronic pain: Secondary | ICD-10-CM | POA: Diagnosis not present

## 2024-04-21 DIAGNOSIS — G629 Polyneuropathy, unspecified: Secondary | ICD-10-CM | POA: Diagnosis not present

## 2024-04-21 DIAGNOSIS — N183 Chronic kidney disease, stage 3 unspecified: Secondary | ICD-10-CM | POA: Diagnosis not present

## 2024-04-21 DIAGNOSIS — I5032 Chronic diastolic (congestive) heart failure: Secondary | ICD-10-CM | POA: Diagnosis not present

## 2024-04-21 DIAGNOSIS — D631 Anemia in chronic kidney disease: Secondary | ICD-10-CM | POA: Diagnosis not present

## 2024-04-23 DIAGNOSIS — N182 Chronic kidney disease, stage 2 (mild): Secondary | ICD-10-CM | POA: Diagnosis not present

## 2024-04-23 DIAGNOSIS — Z8711 Personal history of peptic ulcer disease: Secondary | ICD-10-CM | POA: Diagnosis not present

## 2024-04-23 DIAGNOSIS — E782 Mixed hyperlipidemia: Secondary | ICD-10-CM | POA: Diagnosis not present

## 2024-04-23 DIAGNOSIS — M549 Dorsalgia, unspecified: Secondary | ICD-10-CM | POA: Diagnosis not present

## 2024-04-23 DIAGNOSIS — K219 Gastro-esophageal reflux disease without esophagitis: Secondary | ICD-10-CM | POA: Diagnosis not present

## 2024-04-23 DIAGNOSIS — K5901 Slow transit constipation: Secondary | ICD-10-CM | POA: Diagnosis not present

## 2024-04-23 DIAGNOSIS — I5032 Chronic diastolic (congestive) heart failure: Secondary | ICD-10-CM | POA: Diagnosis not present

## 2024-04-23 DIAGNOSIS — I714 Abdominal aortic aneurysm, without rupture, unspecified: Secondary | ICD-10-CM | POA: Diagnosis not present

## 2024-04-23 DIAGNOSIS — R269 Unspecified abnormalities of gait and mobility: Secondary | ICD-10-CM | POA: Diagnosis not present

## 2024-04-23 DIAGNOSIS — I130 Hypertensive heart and chronic kidney disease with heart failure and stage 1 through stage 4 chronic kidney disease, or unspecified chronic kidney disease: Secondary | ICD-10-CM | POA: Diagnosis not present

## 2024-04-23 DIAGNOSIS — I6529 Occlusion and stenosis of unspecified carotid artery: Secondary | ICD-10-CM | POA: Diagnosis not present

## 2024-04-23 DIAGNOSIS — I2510 Atherosclerotic heart disease of native coronary artery without angina pectoris: Secondary | ICD-10-CM | POA: Diagnosis not present

## 2024-04-25 DIAGNOSIS — D649 Anemia, unspecified: Secondary | ICD-10-CM | POA: Diagnosis not present

## 2024-04-25 DIAGNOSIS — I5032 Chronic diastolic (congestive) heart failure: Secondary | ICD-10-CM | POA: Diagnosis not present

## 2024-04-29 DIAGNOSIS — I5032 Chronic diastolic (congestive) heart failure: Secondary | ICD-10-CM | POA: Diagnosis not present

## 2024-04-29 DIAGNOSIS — G8929 Other chronic pain: Secondary | ICD-10-CM | POA: Diagnosis not present

## 2024-04-29 DIAGNOSIS — N183 Chronic kidney disease, stage 3 unspecified: Secondary | ICD-10-CM | POA: Diagnosis not present

## 2024-04-29 DIAGNOSIS — I129 Hypertensive chronic kidney disease with stage 1 through stage 4 chronic kidney disease, or unspecified chronic kidney disease: Secondary | ICD-10-CM | POA: Diagnosis not present

## 2024-04-29 DIAGNOSIS — D631 Anemia in chronic kidney disease: Secondary | ICD-10-CM | POA: Diagnosis not present

## 2024-04-29 DIAGNOSIS — G629 Polyneuropathy, unspecified: Secondary | ICD-10-CM | POA: Diagnosis not present

## 2024-05-05 DIAGNOSIS — I7091 Generalized atherosclerosis: Secondary | ICD-10-CM | POA: Diagnosis not present

## 2024-05-05 DIAGNOSIS — B351 Tinea unguium: Secondary | ICD-10-CM | POA: Diagnosis not present

## 2024-05-09 DIAGNOSIS — I130 Hypertensive heart and chronic kidney disease with heart failure and stage 1 through stage 4 chronic kidney disease, or unspecified chronic kidney disease: Secondary | ICD-10-CM | POA: Diagnosis not present

## 2024-05-09 DIAGNOSIS — N182 Chronic kidney disease, stage 2 (mild): Secondary | ICD-10-CM | POA: Diagnosis not present

## 2024-05-09 DIAGNOSIS — E782 Mixed hyperlipidemia: Secondary | ICD-10-CM | POA: Diagnosis not present

## 2024-05-09 DIAGNOSIS — I5032 Chronic diastolic (congestive) heart failure: Secondary | ICD-10-CM | POA: Diagnosis not present

## 2024-05-13 DIAGNOSIS — G3184 Mild cognitive impairment, so stated: Secondary | ICD-10-CM | POA: Diagnosis not present

## 2024-05-13 DIAGNOSIS — G4700 Insomnia, unspecified: Secondary | ICD-10-CM | POA: Diagnosis not present

## 2024-05-13 DIAGNOSIS — F331 Major depressive disorder, recurrent, moderate: Secondary | ICD-10-CM | POA: Diagnosis not present

## 2024-05-15 DIAGNOSIS — I129 Hypertensive chronic kidney disease with stage 1 through stage 4 chronic kidney disease, or unspecified chronic kidney disease: Secondary | ICD-10-CM | POA: Diagnosis not present

## 2024-05-15 DIAGNOSIS — I5032 Chronic diastolic (congestive) heart failure: Secondary | ICD-10-CM | POA: Diagnosis not present

## 2024-05-15 DIAGNOSIS — N183 Chronic kidney disease, stage 3 unspecified: Secondary | ICD-10-CM | POA: Diagnosis not present

## 2024-05-19 NOTE — Progress Notes (Unsigned)
 Cardiology Office Note  Date:  05/20/2024   ID:  Anthony [name], DOB July 29, 1936, MRN [id number]  PCP:  Rilla Baller, MD   Chief Complaint  Patient presents with   Follow-up    Establish Care Hx of Atrial flutter   New Patient (Initial Visit)    HPI:  LADAMIEN [name] a 88 y.o. malewith past medical history of: PAD Infrarenal abdominal aortic aneurysm Abdominal aortic aneurysm, status post stent Carotid artery stenosis Obstructive sleep apnea on CPAP Stage IIIb chronic kidney disease Atrial flutter Catheter ablation September 2018 Coronary artery disease, stenting to LAD and RCA 2013 Hyperlipidemia Ejection fraction 55% Myoview  April 2019 nonischemic, low risk Who presents to establish care with myself, follow-up of his coronary artery disease and atrial flutter  Last seen by Dr. Fernande March 2025  In follow-up today reports doing well Sedentary,  walks small amounts with his walker, no recent falls Lives at [place],  [place] to [place], dining room Son visits and helps take care of him  Denies significant joint pain S/p 2 knee replacements Hx of back trouble,Back surgery Poor mobility  Lots of fatigue Denies significant chest pain or shortness of breath on exertion  Lab work reviewed from March 17, 2024  total cholesterol 289  triglycerides 641  LDL too high to measure  A1c 6.3 vitamin D  15  EKG personally reviewed by myself on todays visit EKG Interpretation Date/Time:  Tuesday May 20 2024 14:20:29 EDT Ventricular Rate:  76 PR Interval:  190 QRS Duration:  104 QT Interval:  382 QTC Calculation: 429 R Axis:   -53  Text Interpretation: Normal sinus rhythm with sinus arrhythmia Left axis deviation Minimal voltage criteria for LVH, may be normal variant ( Cornell product ) Nonspecific ST and T wave abnormality When compared with ECG of 22-Nov-2023 10:38, Incomplete right bundle branch block is no longer Present Confirmed by Perla Lye [phone]) on  05/20/2024 2:27:30 PM    PMH:   has a past medical history of Abscess in epidural space of lumbar spine (06/05/2022), Anosmia (1980s), Atrial flutter (HCC) (2018), Basal cell carcinoma (01/11/2022), CAD (coronary artery disease), Cancer (HCC), Chronic constipation, Chronic insomnia, Chronic lower back pain, Coag negative Staphylococcus bacteremia, Complication of anesthesia, Diastolic CHF (HCC), Discitis of lumbosacral region, History of diverticulitis (2017), History of pneumonia (2013), Hyperlipidemia, Hypertension, Hypothyroidism, Irregular heart rhythm (07/29/2022), Obesity, Class II, BMI 35-39.9, with comorbidity, OSA on CPAP, Peptic ulcer disease (07/29/2022), PONV (postoperative nausea and vomiting), and Vertebral osteomyelitis (HCC).  PSH:    Past Surgical History:  Procedure Laterality Date   A-FLUTTER ABLATION N/A 05/07/2017   Procedure: A-Flutter Ablation;  Surgeon: Fernande Elspeth BROCKS, MD;  Location: [facility] INVASIVE CV LAB;  Service: Cardiovascular;  Laterality: N/A;   CARDIOVASCULAR STRESS TEST  11/2017   no ischemia, low risk study   COLONOSCOPY  2007   normal per prior PCP records, rpt 10 yrs (Dr Adelbert)   ELBOW SURGERY Right    ulnar nerve decompression.  PT DOES NOT RECALL THIS PROCEDURE   ENDOVASCULAR REPAIR/STENT GRAFT N/A 05/05/2020   Procedure: ENDOVASCULAR REPAIR/STENT GRAFT;  Surgeon: Marea Selinda RAMAN, MD;  Location: [facility] INVASIVE CV LAB;  Service: Cardiovascular;  Laterality: N/A;   ESOPHAGOGASTRODUODENOSCOPY (EGD) WITH PROPOFOL  N/A 05/30/2022   Procedure: ESOPHAGOGASTRODUODENOSCOPY (EGD) WITH PROPOFOL ;  Surgeon: Therisa Bi, MD;  Location[facility] ENDOSCOPY;  Service: Gastroenterology;  Laterality: N/A;   LAMINECTOMY THORACIC SPINE W/ PLACEMENT SPINAL CORD STIMULATOR  08/2018   and removal of prior spine stimulator (Dr Bluford)  LUMBAR LAMINECTOMY FOR EPIDURAL ABSCESS Bilateral 06/05/2022   Procedure: LUMBAR 2- L3, L5-S1 LAMINECTOMY FOR EPIDURAL ABSCESS;  Surgeon: Clois Fret,  MD;  Location: [facility] ORS;  Service: Neurosurgery;  Laterality: Bilateral;   NASAL SINUS SURGERY     nasal polyps. done a long time ago   PERCUTANEOUS CORONARY STENT INTERVENTION (PCI-S)  2013   EF55%, 70% mid LAD, 99% mid RCA, mild MR, elev LVEDP, DES to mid LAD. RCA is nondominant   RADIOFREQUENCY ABLATION  06/2017   lumbar region [facility])    REPLACEMENT TOTAL KNEE BILATERAL Bilateral 2000s   SPINAL CORD STIMULATOR IMPLANT  06/2016   SPINAL CORD STIMULATOR INSERTION N/A 09/09/2018   Procedure: LUMBAR SPINAL CORD STIMULATOR LEAD AND BATTERY REMOVAL AND LAMINECTOMY FOR PLACEMENT OF PADDLE;  Surgeon: Bluford Standing, MD;  Location: [facility] ORS;  Service: Neurosurgery;  Laterality: N/A;   SPINAL CORD STIMULATOR INSERTION N/A 09/16/2018   Procedure: PLACEMENT OF SPINAL CORD STIMULATOR BATTERY;  Surgeon: Bluford Standing, MD;  Location: [facility] ORS;  Service: Neurosurgery;  Laterality: N/A;   SPINAL CORD STIMULATOR REMOVAL N/A 06/03/2022   Procedure: LUMBAR SPINAL CORD STIMULATOR REMOVAL;  Surgeon: Clois Fret, MD;  Location: [facility] ORS;  Service: Neurosurgery;  Laterality: N/A;    Current Outpatient Medications  Medication Sig Dispense Refill   acetaminophen  (TYLENOL ) 500 MG tablet Take 1 tablet (500 mg total) by mouth 2 (two) times daily as needed for moderate pain.     Alirocumab  (PRALUENT ) 75 MG/ML SOAJ ADMINISTER 75MG  UNDER THE SKIN EVERY 14 DAYS 6 mL 3   aspirin  EC 81 MG tablet Take 1 tablet (81 mg total) by mouth daily.     clopidogrel  (PLAVIX ) 75 MG tablet Take 1 tablet (75 mg total) by mouth daily. 30 tablet 6   Cyanocobalamin  (B-12) 1000 MCG SUBL Place 1 tablet under the tongue daily.     diphenhydramine -acetaminophen  (TYLENOL  PM) 25-500 MG TABS tablet Take 1 tablet by mouth at bedtime.     docusate sodium  (COLACE) 100 MG capsule Take 100 mg by mouth daily.     DULoxetine  (CYMBALTA ) 20 MG capsule Take 1 capsule (20 mg total) by mouth daily.     ezetimibe  (ZETIA ) 10 MG tablet Take 1 tablet (10  mg total) by mouth daily. 90 tablet 3   finasteride  (PROSCAR ) 5 MG tablet TAKE 1 TABLET(5 MG) BY MOUTH DAILY     fluocinolone (SYNALAR) 0.025 % cream Apply topically 2 (two) times daily as needed.     furosemide  (LASIX ) 40 MG tablet Take 1 tablet (40 mg total) by mouth daily.     gabapentin  (NEURONTIN ) 300 MG capsule TAKE 2 CAPSULE BY MOUTH AT BEDTIME WITH EXTRA CAPSULE DURING THE DAY AS NEEDED     hydrocortisone  2.5 % cream APPLY TO FACE AT BEDTIME TUES, THURS, SAT for seborrheic dermatitis 30 g 11   ketoconazole  (NIZORAL ) 2 % cream APPLY TO FACE AT BEDTIME MON, WED, FRI for seborrheic dermatitis 60 g 11   ketoconazole  (NIZORAL ) 2 % shampoo apply three times per week, massage and lather into scalp and behind ears and leave in for 10 minutes before rinsing out  on Monday, Wed, Friday for seborrheic dermatitis 120 mL 11   levothyroxine  (SYNTHROID ) 175 MCG tablet Take 1 tablet (175 mcg total) by mouth daily before breakfast. 90 tablet 1   lidocaine  4 % Place 1 patch onto the skin daily. OTC     LINZESS  290 MCG CAPS capsule TAKE 1 CAPSULE(290 MCG) BY MOUTH DAILY BEFORE BREAKFAST 90 capsule  1   Melatonin 10 MG CAPS Take 10 mg by mouth at bedtime.     meloxicam  (MOBIC ) 7.5 MG tablet Take 7.5 mg by mouth daily.     metoprolol  succinate (TOPROL -XL) 50 MG 24 hr tablet Take 1 tablet (50 mg total) by mouth daily.     mometasone  (ELOCON ) 0.1 % cream Apply 1 Application topically every Monday, Wednesday, and Friday. bedtime     mometasone  (ELOCON ) 0.1 % lotion apply topically to areas behind ears and scalp nightly and leave on over night for seborrheic dermatitis 60 mL 6   Multiple Vitamins-Minerals (PRESERVISION AREDS 2 PO) Take 1 tablet by mouth daily.     nitroGLYCERIN  (NITROSTAT ) 0.4 MG SL tablet Place 1 tablet (0.4 mg total) under the tongue every 5 (five) minutes as needed for chest pain. 25 tablet 0   pantoprazole  (PROTONIX ) 40 MG tablet Take 1 tablet (40 mg total) by mouth daily.     polyethylene  glycol (MIRALAX  / GLYCOLAX ) 17 g packet Take 17 g by mouth daily.     sucralfate  (CARAFATE ) 1 g tablet Take 1 g by mouth 2 (two) times daily.     tamsulosin  (FLOMAX ) 0.4 MG CAPS capsule Take 1 capsule (0.4 mg total) by mouth daily.     VASCEPA  1 g capsule Take 2 capsules (2 g total) by mouth 2 (two) times daily. 360 capsule 3   ciclopirox  (PENLAC ) 8 % solution APPLY TOPICALLY AT BEDTIME APPLY OVER NAIL AND SURROUNDING SKIN, APPLY DAILY OVER PREVIOUS COAT, AFTER 7 DAYS MAY REMOVE WITH ALCOHOL AND REPEAT (Patient not taking: Reported on 05/20/2024) 6.6 mL 11   losartan  (COZAAR ) 25 MG tablet Take 1 tablet (25 mg total) by mouth daily. (Patient not taking: Reported on 05/20/2024) 90 tablet 4   Multiple Vitamins-Minerals (CENTRUM SILVER PO) Take 1 tablet by mouth daily. (Patient not taking: Reported on 05/20/2024)     ondansetron  (ZOFRAN ) 4 MG tablet Take 4 mg by mouth every 6 (six) hours as needed for nausea. (Patient not taking: Reported on 05/20/2024)     Oxycodone  HCl 10 MG TABS Take 1 tablet (10 mg total) by mouth 2 (two) times daily as needed (moderate pain). (Patient not taking: Reported on 05/20/2024) 30 tablet 0   senna (SENOKOT) 8.6 MG tablet Take 1 tablet (8.6 mg total) by mouth daily. (Patient not taking: Reported on 05/20/2024) 30 tablet 3   tizanidine  (ZANAFLEX ) 2 MG capsule Take 2 mg by mouth 2 (two) times daily as needed for muscle spasms. (Patient not taking: Reported on 05/20/2024)     Vitamin D , Cholecalciferol , 25 MCG (1000 UT) TABS Take 1 tablet by mouth daily. (Patient not taking: Reported on 05/20/2024)     No current facility-administered medications for this visit.     Allergies:   Questran [cholestyramine], Atorvastatin, Gemfibrozil, Metformin and related, and Rosuvastatin   Social History:  The patient  reports that he has never smoked. He has never been exposed to tobacco smoke. He has never used smokeless tobacco. He reports that he does not drink alcohol and does not use drugs.    Family History:   family history includes Kidney disease in his father; Leukemia in his brother; Stroke in his mother and sister.    Review of Systems: Review of Systems  Constitutional: Negative.   HENT: Negative.    Respiratory: Negative.    Cardiovascular: Negative.   Gastrointestinal: Negative.   Musculoskeletal: Negative.   Neurological: Negative.   Psychiatric/Behavioral:  Positive for substance abuse.  All other systems reviewed and are negative.   PHYSICAL EXAM: VS:  BP 112/78 (BP Location: Left Arm, Patient Position: Sitting, Cuff Size: Normal)   Pulse 76   Ht 6' 2 (1.88 m)   Wt (!) 303 lb 9.6 oz (137.7 kg)   SpO2 95%   BMI 38.98 kg/m  , BMI Body mass index is 38.98 kg/m. GEN: Well nourished, well developed, in no acute distress HEENT: normal Neck: no JVD, carotid bruits, or masses Cardiac: RRR; no murmurs, rubs, or gallops,no edema  Respiratory:  clear to auscultation bilaterally, normal work of breathing GI: soft, nontender, nondistended, + BS MS: no deformity or atrophy Skin: warm and dry, no rash Neuro:  Strength and sensation are intact Psych: euthymic mood, full affect   Recent Labs: 08/17/2023: TSH 4.692 08/18/2023: ALT 14 08/20/2023: Hemoglobin 13.6; Platelets 251 09/04/2023: BUN 25; Creatinine, Ser 1.36; Magnesium  2.0; Potassium 4.1; Sodium 138    Lipid Panel Lab Results  Component Value Date   CHOL 186 08/10/2023   HDL 38.00 (L) 08/10/2023   LDLCALC 24 10/06/2019   TRIG (H) 08/10/2023    446.0 Triglyceride is over 400; calculations on Lipids are invalid.      Wt Readings from Last 3 Encounters:  05/20/24 (!) 303 lb 9.6 oz (137.7 kg)  01/08/24 300 lb (136.1 kg)  11/22/23 290 lb (131.5 kg)     ASSESSMENT AND PLAN:  Problem List Items Addressed This Visit       Cardiology Problems   Essential hypertension   Abdominal aortic aneurysm (AAA) without rupture   Carotid stenosis   Hypercholesterolemia with hypertriglyceridemia   CAD  (coronary artery disease)   Other Visit Diagnoses       Atrial flutter, unspecified type (HCC)    -  Primary   Relevant Orders   EKG 12-Lead (Completed)     Mixed hyperlipidemia         PAD (peripheral artery disease)          Coronary artery disease with stable angina Currently with no symptoms of angina. No further workup at this time.  May be missing some of his Praluent  doses through [facility] will help to assist with giving Praluent  every 2 weeks  Hyperlipidemia Numbers previously well-controlled on Zetia  with Praluent  75 every 2 weeks Has not been receiving Praluent , cholesterol up to 280 Son will assist  PAD History of aortic aneurysm, stenting Stressed importance of progressive lipid management Carotid ultrasound with less than 39% stenosis bilaterally May 2025 Abdominal ultrasound May 10, 2024 no change On aspirin  Plavix   Atrial flutter Maintaining normal sinus rhythm, continue metoprolol  Prior history of ablation    Signed, Velinda Lunger, M.D., Ph.D. [facility] [place], [place] [phone]

## 2024-05-20 ENCOUNTER — Ambulatory Visit: Attending: Cardiovascular Disease | Admitting: Cardiovascular Disease

## 2024-05-20 ENCOUNTER — Encounter: Payer: Self-pay | Admitting: Cardiovascular Disease

## 2024-05-20 VITALS — BP 112/78 | HR 76 | Ht 74.0 in | Wt 303.6 lb

## 2024-05-20 DIAGNOSIS — I25118 Atherosclerotic heart disease of native coronary artery with other forms of angina pectoris: Secondary | ICD-10-CM | POA: Diagnosis not present

## 2024-05-20 DIAGNOSIS — I10 Essential (primary) hypertension: Secondary | ICD-10-CM | POA: Diagnosis not present

## 2024-05-20 DIAGNOSIS — I7143 Infrarenal abdominal aortic aneurysm, without rupture: Secondary | ICD-10-CM | POA: Insufficient documentation

## 2024-05-20 DIAGNOSIS — I6523 Occlusion and stenosis of bilateral carotid arteries: Secondary | ICD-10-CM | POA: Diagnosis not present

## 2024-05-20 DIAGNOSIS — I4892 Unspecified atrial flutter: Secondary | ICD-10-CM | POA: Diagnosis not present

## 2024-05-20 DIAGNOSIS — E782 Mixed hyperlipidemia: Secondary | ICD-10-CM | POA: Diagnosis not present

## 2024-05-20 DIAGNOSIS — I739 Peripheral vascular disease, unspecified: Secondary | ICD-10-CM | POA: Insufficient documentation

## 2024-05-20 MED ORDER — PRALUENT 75 MG/ML ~~LOC~~ SOAJ: SUBCUTANEOUS | 3 refills | Status: AC

## 2024-05-20 MED ORDER — VASCEPA 1 G PO CAPS: 2.0000 g | ORAL_CAPSULE | Freq: Two times a day (BID) | ORAL | 3 refills | Status: AC

## 2024-05-20 NOTE — Patient Instructions (Addendum)

## 2024-05-23 DIAGNOSIS — G4730 Sleep apnea, unspecified: Secondary | ICD-10-CM | POA: Diagnosis not present

## 2024-05-23 DIAGNOSIS — S51802A Unspecified open wound of left forearm, initial encounter: Secondary | ICD-10-CM | POA: Diagnosis not present

## 2024-05-23 DIAGNOSIS — W07XXXA Fall from chair, initial encounter: Secondary | ICD-10-CM | POA: Diagnosis not present

## 2024-05-27 DIAGNOSIS — S81812D Laceration without foreign body, left lower leg, subsequent encounter: Secondary | ICD-10-CM | POA: Diagnosis not present

## 2024-05-27 DIAGNOSIS — I4892 Unspecified atrial flutter: Secondary | ICD-10-CM | POA: Diagnosis not present

## 2024-05-27 DIAGNOSIS — I5043 Acute on chronic combined systolic (congestive) and diastolic (congestive) heart failure: Secondary | ICD-10-CM | POA: Diagnosis not present

## 2024-05-27 DIAGNOSIS — I6529 Occlusion and stenosis of unspecified carotid artery: Secondary | ICD-10-CM | POA: Diagnosis not present

## 2024-05-27 DIAGNOSIS — I2510 Atherosclerotic heart disease of native coronary artery without angina pectoris: Secondary | ICD-10-CM | POA: Diagnosis not present

## 2024-05-27 DIAGNOSIS — F0390 Unspecified dementia without behavioral disturbance: Secondary | ICD-10-CM | POA: Diagnosis not present

## 2024-05-27 DIAGNOSIS — K219 Gastro-esophageal reflux disease without esophagitis: Secondary | ICD-10-CM | POA: Diagnosis not present

## 2024-05-27 DIAGNOSIS — G4733 Obstructive sleep apnea (adult) (pediatric): Secondary | ICD-10-CM | POA: Diagnosis not present

## 2024-05-27 DIAGNOSIS — G8929 Other chronic pain: Secondary | ICD-10-CM | POA: Diagnosis not present

## 2024-05-27 DIAGNOSIS — Z9181 History of falling: Secondary | ICD-10-CM | POA: Diagnosis not present

## 2024-05-27 DIAGNOSIS — N400 Enlarged prostate without lower urinary tract symptoms: Secondary | ICD-10-CM | POA: Diagnosis not present

## 2024-05-27 DIAGNOSIS — D631 Anemia in chronic kidney disease: Secondary | ICD-10-CM | POA: Diagnosis not present

## 2024-05-27 DIAGNOSIS — E039 Hypothyroidism, unspecified: Secondary | ICD-10-CM | POA: Diagnosis not present

## 2024-05-27 DIAGNOSIS — I5032 Chronic diastolic (congestive) heart failure: Secondary | ICD-10-CM | POA: Diagnosis not present

## 2024-05-27 DIAGNOSIS — N183 Chronic kidney disease, stage 3 unspecified: Secondary | ICD-10-CM | POA: Diagnosis not present

## 2024-05-27 DIAGNOSIS — I130 Hypertensive heart and chronic kidney disease with heart failure and stage 1 through stage 4 chronic kidney disease, or unspecified chronic kidney disease: Secondary | ICD-10-CM | POA: Diagnosis not present

## 2024-06-02 DIAGNOSIS — D631 Anemia in chronic kidney disease: Secondary | ICD-10-CM | POA: Diagnosis not present

## 2024-06-02 DIAGNOSIS — F0390 Unspecified dementia without behavioral disturbance: Secondary | ICD-10-CM | POA: Diagnosis not present

## 2024-06-02 DIAGNOSIS — I130 Hypertensive heart and chronic kidney disease with heart failure and stage 1 through stage 4 chronic kidney disease, or unspecified chronic kidney disease: Secondary | ICD-10-CM | POA: Diagnosis not present

## 2024-06-02 DIAGNOSIS — S81812D Laceration without foreign body, left lower leg, subsequent encounter: Secondary | ICD-10-CM | POA: Diagnosis not present

## 2024-06-02 DIAGNOSIS — N183 Chronic kidney disease, stage 3 unspecified: Secondary | ICD-10-CM | POA: Diagnosis not present

## 2024-06-02 DIAGNOSIS — I5032 Chronic diastolic (congestive) heart failure: Secondary | ICD-10-CM | POA: Diagnosis not present

## 2024-06-05 DIAGNOSIS — I714 Abdominal aortic aneurysm, without rupture, unspecified: Secondary | ICD-10-CM | POA: Diagnosis not present

## 2024-06-05 DIAGNOSIS — E782 Mixed hyperlipidemia: Secondary | ICD-10-CM | POA: Diagnosis not present

## 2024-06-05 DIAGNOSIS — I5032 Chronic diastolic (congestive) heart failure: Secondary | ICD-10-CM | POA: Diagnosis not present

## 2024-06-05 DIAGNOSIS — F0390 Unspecified dementia without behavioral disturbance: Secondary | ICD-10-CM | POA: Diagnosis not present

## 2024-06-05 DIAGNOSIS — N183 Chronic kidney disease, stage 3 unspecified: Secondary | ICD-10-CM | POA: Diagnosis not present

## 2024-06-05 DIAGNOSIS — S81812D Laceration without foreign body, left lower leg, subsequent encounter: Secondary | ICD-10-CM | POA: Diagnosis not present

## 2024-06-05 DIAGNOSIS — D631 Anemia in chronic kidney disease: Secondary | ICD-10-CM | POA: Diagnosis not present

## 2024-06-05 DIAGNOSIS — I130 Hypertensive heart and chronic kidney disease with heart failure and stage 1 through stage 4 chronic kidney disease, or unspecified chronic kidney disease: Secondary | ICD-10-CM | POA: Diagnosis not present

## 2024-06-05 DIAGNOSIS — N400 Enlarged prostate without lower urinary tract symptoms: Secondary | ICD-10-CM | POA: Diagnosis not present

## 2024-06-05 DIAGNOSIS — I2510 Atherosclerotic heart disease of native coronary artery without angina pectoris: Secondary | ICD-10-CM | POA: Diagnosis not present

## 2024-06-09 DIAGNOSIS — F0390 Unspecified dementia without behavioral disturbance: Secondary | ICD-10-CM | POA: Diagnosis not present

## 2024-06-09 DIAGNOSIS — I130 Hypertensive heart and chronic kidney disease with heart failure and stage 1 through stage 4 chronic kidney disease, or unspecified chronic kidney disease: Secondary | ICD-10-CM | POA: Diagnosis not present

## 2024-06-09 DIAGNOSIS — N183 Chronic kidney disease, stage 3 unspecified: Secondary | ICD-10-CM | POA: Diagnosis not present

## 2024-06-09 DIAGNOSIS — D631 Anemia in chronic kidney disease: Secondary | ICD-10-CM | POA: Diagnosis not present

## 2024-06-09 DIAGNOSIS — S81812D Laceration without foreign body, left lower leg, subsequent encounter: Secondary | ICD-10-CM | POA: Diagnosis not present

## 2024-06-09 DIAGNOSIS — I5032 Chronic diastolic (congestive) heart failure: Secondary | ICD-10-CM | POA: Diagnosis not present

## 2024-06-10 DIAGNOSIS — G4701 Insomnia due to medical condition: Secondary | ICD-10-CM | POA: Diagnosis not present

## 2024-06-10 DIAGNOSIS — R4181 Age-related cognitive decline: Secondary | ICD-10-CM | POA: Diagnosis not present

## 2024-06-10 DIAGNOSIS — F331 Major depressive disorder, recurrent, moderate: Secondary | ICD-10-CM | POA: Diagnosis not present

## 2024-06-11 DIAGNOSIS — S81812D Laceration without foreign body, left lower leg, subsequent encounter: Secondary | ICD-10-CM | POA: Diagnosis not present

## 2024-06-11 DIAGNOSIS — I130 Hypertensive heart and chronic kidney disease with heart failure and stage 1 through stage 4 chronic kidney disease, or unspecified chronic kidney disease: Secondary | ICD-10-CM | POA: Diagnosis not present

## 2024-06-11 DIAGNOSIS — I5032 Chronic diastolic (congestive) heart failure: Secondary | ICD-10-CM | POA: Diagnosis not present

## 2024-06-11 DIAGNOSIS — N183 Chronic kidney disease, stage 3 unspecified: Secondary | ICD-10-CM | POA: Diagnosis not present

## 2024-06-11 DIAGNOSIS — F0390 Unspecified dementia without behavioral disturbance: Secondary | ICD-10-CM | POA: Diagnosis not present

## 2024-06-11 DIAGNOSIS — D631 Anemia in chronic kidney disease: Secondary | ICD-10-CM | POA: Diagnosis not present

## 2024-06-16 DIAGNOSIS — I130 Hypertensive heart and chronic kidney disease with heart failure and stage 1 through stage 4 chronic kidney disease, or unspecified chronic kidney disease: Secondary | ICD-10-CM | POA: Diagnosis not present

## 2024-06-16 DIAGNOSIS — S81812D Laceration without foreign body, left lower leg, subsequent encounter: Secondary | ICD-10-CM | POA: Diagnosis not present

## 2024-06-16 DIAGNOSIS — D631 Anemia in chronic kidney disease: Secondary | ICD-10-CM | POA: Diagnosis not present

## 2024-06-16 DIAGNOSIS — F0390 Unspecified dementia without behavioral disturbance: Secondary | ICD-10-CM | POA: Diagnosis not present

## 2024-06-16 DIAGNOSIS — N183 Chronic kidney disease, stage 3 unspecified: Secondary | ICD-10-CM | POA: Diagnosis not present

## 2024-06-16 DIAGNOSIS — I5032 Chronic diastolic (congestive) heart failure: Secondary | ICD-10-CM | POA: Diagnosis not present

## 2024-06-19 DIAGNOSIS — I5032 Chronic diastolic (congestive) heart failure: Secondary | ICD-10-CM | POA: Diagnosis not present

## 2024-06-19 DIAGNOSIS — I130 Hypertensive heart and chronic kidney disease with heart failure and stage 1 through stage 4 chronic kidney disease, or unspecified chronic kidney disease: Secondary | ICD-10-CM | POA: Diagnosis not present

## 2024-06-19 DIAGNOSIS — F0390 Unspecified dementia without behavioral disturbance: Secondary | ICD-10-CM | POA: Diagnosis not present

## 2024-06-19 DIAGNOSIS — D631 Anemia in chronic kidney disease: Secondary | ICD-10-CM | POA: Diagnosis not present

## 2024-06-19 DIAGNOSIS — S81812D Laceration without foreign body, left lower leg, subsequent encounter: Secondary | ICD-10-CM | POA: Diagnosis not present

## 2024-06-19 DIAGNOSIS — N183 Chronic kidney disease, stage 3 unspecified: Secondary | ICD-10-CM | POA: Diagnosis not present

## 2024-06-20 DIAGNOSIS — I5043 Acute on chronic combined systolic (congestive) and diastolic (congestive) heart failure: Secondary | ICD-10-CM | POA: Diagnosis not present

## 2024-06-20 DIAGNOSIS — E039 Hypothyroidism, unspecified: Secondary | ICD-10-CM | POA: Diagnosis not present

## 2024-06-23 DIAGNOSIS — S81812D Laceration without foreign body, left lower leg, subsequent encounter: Secondary | ICD-10-CM | POA: Diagnosis not present

## 2024-06-23 DIAGNOSIS — I5032 Chronic diastolic (congestive) heart failure: Secondary | ICD-10-CM | POA: Diagnosis not present

## 2024-06-23 DIAGNOSIS — N183 Chronic kidney disease, stage 3 unspecified: Secondary | ICD-10-CM | POA: Diagnosis not present

## 2024-06-23 DIAGNOSIS — F0390 Unspecified dementia without behavioral disturbance: Secondary | ICD-10-CM | POA: Diagnosis not present

## 2024-06-23 DIAGNOSIS — I130 Hypertensive heart and chronic kidney disease with heart failure and stage 1 through stage 4 chronic kidney disease, or unspecified chronic kidney disease: Secondary | ICD-10-CM | POA: Diagnosis not present

## 2024-06-23 DIAGNOSIS — D631 Anemia in chronic kidney disease: Secondary | ICD-10-CM | POA: Diagnosis not present

## 2024-06-24 DIAGNOSIS — F331 Major depressive disorder, recurrent, moderate: Secondary | ICD-10-CM | POA: Diagnosis not present

## 2024-06-24 DIAGNOSIS — R4181 Age-related cognitive decline: Secondary | ICD-10-CM | POA: Diagnosis not present

## 2024-06-24 DIAGNOSIS — G4700 Insomnia, unspecified: Secondary | ICD-10-CM | POA: Diagnosis not present

## 2024-06-25 DIAGNOSIS — S81812D Laceration without foreign body, left lower leg, subsequent encounter: Secondary | ICD-10-CM | POA: Diagnosis not present

## 2024-06-25 DIAGNOSIS — I130 Hypertensive heart and chronic kidney disease with heart failure and stage 1 through stage 4 chronic kidney disease, or unspecified chronic kidney disease: Secondary | ICD-10-CM | POA: Diagnosis not present

## 2024-06-25 DIAGNOSIS — F0390 Unspecified dementia without behavioral disturbance: Secondary | ICD-10-CM | POA: Diagnosis not present

## 2024-06-26 DIAGNOSIS — K219 Gastro-esophageal reflux disease without esophagitis: Secondary | ICD-10-CM | POA: Diagnosis not present

## 2024-06-26 DIAGNOSIS — D631 Anemia in chronic kidney disease: Secondary | ICD-10-CM | POA: Diagnosis not present

## 2024-06-26 DIAGNOSIS — F0390 Unspecified dementia without behavioral disturbance: Secondary | ICD-10-CM | POA: Diagnosis not present

## 2024-06-26 DIAGNOSIS — I2510 Atherosclerotic heart disease of native coronary artery without angina pectoris: Secondary | ICD-10-CM | POA: Diagnosis not present

## 2024-06-26 DIAGNOSIS — N183 Chronic kidney disease, stage 3 unspecified: Secondary | ICD-10-CM | POA: Diagnosis not present

## 2024-06-26 DIAGNOSIS — I6529 Occlusion and stenosis of unspecified carotid artery: Secondary | ICD-10-CM | POA: Diagnosis not present

## 2024-06-26 DIAGNOSIS — E039 Hypothyroidism, unspecified: Secondary | ICD-10-CM | POA: Diagnosis not present

## 2024-06-26 DIAGNOSIS — Z9181 History of falling: Secondary | ICD-10-CM | POA: Diagnosis not present

## 2024-06-26 DIAGNOSIS — G4733 Obstructive sleep apnea (adult) (pediatric): Secondary | ICD-10-CM | POA: Diagnosis not present

## 2024-06-26 DIAGNOSIS — I130 Hypertensive heart and chronic kidney disease with heart failure and stage 1 through stage 4 chronic kidney disease, or unspecified chronic kidney disease: Secondary | ICD-10-CM | POA: Diagnosis not present

## 2024-06-26 DIAGNOSIS — I4892 Unspecified atrial flutter: Secondary | ICD-10-CM | POA: Diagnosis not present

## 2024-06-26 DIAGNOSIS — N400 Enlarged prostate without lower urinary tract symptoms: Secondary | ICD-10-CM | POA: Diagnosis not present

## 2024-06-26 DIAGNOSIS — S81812D Laceration without foreign body, left lower leg, subsequent encounter: Secondary | ICD-10-CM | POA: Diagnosis not present

## 2024-06-26 DIAGNOSIS — I5032 Chronic diastolic (congestive) heart failure: Secondary | ICD-10-CM | POA: Diagnosis not present

## 2024-06-26 DIAGNOSIS — G8929 Other chronic pain: Secondary | ICD-10-CM | POA: Diagnosis not present

## 2024-06-27 DIAGNOSIS — S81812D Laceration without foreign body, left lower leg, subsequent encounter: Secondary | ICD-10-CM | POA: Diagnosis not present

## 2024-06-27 DIAGNOSIS — I130 Hypertensive heart and chronic kidney disease with heart failure and stage 1 through stage 4 chronic kidney disease, or unspecified chronic kidney disease: Secondary | ICD-10-CM | POA: Diagnosis not present

## 2024-06-27 DIAGNOSIS — D631 Anemia in chronic kidney disease: Secondary | ICD-10-CM | POA: Diagnosis not present

## 2024-06-27 DIAGNOSIS — N183 Chronic kidney disease, stage 3 unspecified: Secondary | ICD-10-CM | POA: Diagnosis not present

## 2024-06-27 DIAGNOSIS — F0390 Unspecified dementia without behavioral disturbance: Secondary | ICD-10-CM | POA: Diagnosis not present

## 2024-06-27 DIAGNOSIS — I5032 Chronic diastolic (congestive) heart failure: Secondary | ICD-10-CM | POA: Diagnosis not present

## 2024-07-01 DIAGNOSIS — D631 Anemia in chronic kidney disease: Secondary | ICD-10-CM | POA: Diagnosis not present

## 2024-07-01 DIAGNOSIS — F0390 Unspecified dementia without behavioral disturbance: Secondary | ICD-10-CM | POA: Diagnosis not present

## 2024-07-01 DIAGNOSIS — N183 Chronic kidney disease, stage 3 unspecified: Secondary | ICD-10-CM | POA: Diagnosis not present

## 2024-07-01 DIAGNOSIS — S81812D Laceration without foreign body, left lower leg, subsequent encounter: Secondary | ICD-10-CM | POA: Diagnosis not present

## 2024-07-01 DIAGNOSIS — I130 Hypertensive heart and chronic kidney disease with heart failure and stage 1 through stage 4 chronic kidney disease, or unspecified chronic kidney disease: Secondary | ICD-10-CM | POA: Diagnosis not present

## 2024-07-01 DIAGNOSIS — I5032 Chronic diastolic (congestive) heart failure: Secondary | ICD-10-CM | POA: Diagnosis not present

## 2024-07-03 DIAGNOSIS — S81812D Laceration without foreign body, left lower leg, subsequent encounter: Secondary | ICD-10-CM | POA: Diagnosis not present

## 2024-07-03 DIAGNOSIS — D631 Anemia in chronic kidney disease: Secondary | ICD-10-CM | POA: Diagnosis not present

## 2024-07-03 DIAGNOSIS — I5032 Chronic diastolic (congestive) heart failure: Secondary | ICD-10-CM | POA: Diagnosis not present

## 2024-07-03 DIAGNOSIS — F0390 Unspecified dementia without behavioral disturbance: Secondary | ICD-10-CM | POA: Diagnosis not present

## 2024-07-03 DIAGNOSIS — N182 Chronic kidney disease, stage 2 (mild): Secondary | ICD-10-CM | POA: Diagnosis not present

## 2024-07-03 DIAGNOSIS — N183 Chronic kidney disease, stage 3 unspecified: Secondary | ICD-10-CM | POA: Diagnosis not present

## 2024-07-03 DIAGNOSIS — I130 Hypertensive heart and chronic kidney disease with heart failure and stage 1 through stage 4 chronic kidney disease, or unspecified chronic kidney disease: Secondary | ICD-10-CM | POA: Diagnosis not present

## 2024-07-07 DIAGNOSIS — I5032 Chronic diastolic (congestive) heart failure: Secondary | ICD-10-CM | POA: Diagnosis not present

## 2024-07-07 DIAGNOSIS — S81812D Laceration without foreign body, left lower leg, subsequent encounter: Secondary | ICD-10-CM | POA: Diagnosis not present

## 2024-07-07 DIAGNOSIS — D631 Anemia in chronic kidney disease: Secondary | ICD-10-CM | POA: Diagnosis not present

## 2024-07-07 DIAGNOSIS — N183 Chronic kidney disease, stage 3 unspecified: Secondary | ICD-10-CM | POA: Diagnosis not present

## 2024-07-07 DIAGNOSIS — F0390 Unspecified dementia without behavioral disturbance: Secondary | ICD-10-CM | POA: Diagnosis not present

## 2024-07-07 DIAGNOSIS — I130 Hypertensive heart and chronic kidney disease with heart failure and stage 1 through stage 4 chronic kidney disease, or unspecified chronic kidney disease: Secondary | ICD-10-CM | POA: Diagnosis not present

## 2024-07-08 DIAGNOSIS — F331 Major depressive disorder, recurrent, moderate: Secondary | ICD-10-CM | POA: Diagnosis not present

## 2024-07-08 DIAGNOSIS — R4181 Age-related cognitive decline: Secondary | ICD-10-CM | POA: Diagnosis not present

## 2024-07-08 DIAGNOSIS — G4700 Insomnia, unspecified: Secondary | ICD-10-CM | POA: Diagnosis not present

## 2024-07-10 DIAGNOSIS — D631 Anemia in chronic kidney disease: Secondary | ICD-10-CM | POA: Diagnosis not present

## 2024-07-10 DIAGNOSIS — I5032 Chronic diastolic (congestive) heart failure: Secondary | ICD-10-CM | POA: Diagnosis not present

## 2024-07-10 DIAGNOSIS — F0390 Unspecified dementia without behavioral disturbance: Secondary | ICD-10-CM | POA: Diagnosis not present

## 2024-07-10 DIAGNOSIS — I130 Hypertensive heart and chronic kidney disease with heart failure and stage 1 through stage 4 chronic kidney disease, or unspecified chronic kidney disease: Secondary | ICD-10-CM | POA: Diagnosis not present

## 2024-07-10 DIAGNOSIS — N183 Chronic kidney disease, stage 3 unspecified: Secondary | ICD-10-CM | POA: Diagnosis not present

## 2024-07-10 DIAGNOSIS — S81812D Laceration without foreign body, left lower leg, subsequent encounter: Secondary | ICD-10-CM | POA: Diagnosis not present

## 2024-07-17 ENCOUNTER — Telehealth: Payer: Self-pay | Admitting: Family Medicine

## 2024-07-17 NOTE — Telephone Encounter (Signed)
 Contacted Anthony [name] to schedule their annual wellness visit. Patient declined to schedule AWV at this time.Transferred care to Anthony [name] assistant living.  [facility] Care Guide [name] AWV TEAM Direct Dial: [phone]

## 2024-07-18 DIAGNOSIS — I130 Hypertensive heart and chronic kidney disease with heart failure and stage 1 through stage 4 chronic kidney disease, or unspecified chronic kidney disease: Secondary | ICD-10-CM | POA: Diagnosis not present

## 2024-07-18 DIAGNOSIS — I5032 Chronic diastolic (congestive) heart failure: Secondary | ICD-10-CM | POA: Diagnosis not present

## 2024-07-18 DIAGNOSIS — F0390 Unspecified dementia without behavioral disturbance: Secondary | ICD-10-CM | POA: Diagnosis not present

## 2024-07-18 DIAGNOSIS — N183 Chronic kidney disease, stage 3 unspecified: Secondary | ICD-10-CM | POA: Diagnosis not present

## 2024-07-18 DIAGNOSIS — D631 Anemia in chronic kidney disease: Secondary | ICD-10-CM | POA: Diagnosis not present

## 2024-07-18 DIAGNOSIS — S81812D Laceration without foreign body, left lower leg, subsequent encounter: Secondary | ICD-10-CM | POA: Diagnosis not present

## 2024-08-05 DIAGNOSIS — G4700 Insomnia, unspecified: Secondary | ICD-10-CM | POA: Diagnosis not present

## 2024-08-05 DIAGNOSIS — F331 Major depressive disorder, recurrent, moderate: Secondary | ICD-10-CM | POA: Diagnosis not present

## 2024-08-05 DIAGNOSIS — R4181 Age-related cognitive decline: Secondary | ICD-10-CM | POA: Diagnosis not present

## 2024-08-06 DIAGNOSIS — I2510 Atherosclerotic heart disease of native coronary artery without angina pectoris: Secondary | ICD-10-CM | POA: Diagnosis not present

## 2024-08-06 DIAGNOSIS — M549 Dorsalgia, unspecified: Secondary | ICD-10-CM | POA: Diagnosis not present

## 2024-08-06 DIAGNOSIS — K5901 Slow transit constipation: Secondary | ICD-10-CM | POA: Diagnosis not present

## 2024-08-06 DIAGNOSIS — K219 Gastro-esophageal reflux disease without esophagitis: Secondary | ICD-10-CM | POA: Diagnosis not present

## 2024-08-06 DIAGNOSIS — I130 Hypertensive heart and chronic kidney disease with heart failure and stage 1 through stage 4 chronic kidney disease, or unspecified chronic kidney disease: Secondary | ICD-10-CM | POA: Diagnosis not present

## 2024-08-06 DIAGNOSIS — I5032 Chronic diastolic (congestive) heart failure: Secondary | ICD-10-CM | POA: Diagnosis not present

## 2024-08-06 DIAGNOSIS — N182 Chronic kidney disease, stage 2 (mild): Secondary | ICD-10-CM | POA: Diagnosis not present

## 2024-08-06 DIAGNOSIS — Z6841 Body Mass Index (BMI) 40.0 and over, adult: Secondary | ICD-10-CM | POA: Diagnosis not present

## 2024-08-06 DIAGNOSIS — E782 Mixed hyperlipidemia: Secondary | ICD-10-CM | POA: Diagnosis not present

## 2024-08-06 DIAGNOSIS — Z8711 Personal history of peptic ulcer disease: Secondary | ICD-10-CM | POA: Diagnosis not present

## 2024-08-06 DIAGNOSIS — E559 Vitamin D deficiency, unspecified: Secondary | ICD-10-CM | POA: Diagnosis not present

## 2024-08-06 DIAGNOSIS — N400 Enlarged prostate without lower urinary tract symptoms: Secondary | ICD-10-CM | POA: Diagnosis not present

## 2024-08-07 DIAGNOSIS — N400 Enlarged prostate without lower urinary tract symptoms: Secondary | ICD-10-CM | POA: Diagnosis not present

## 2024-08-07 DIAGNOSIS — E782 Mixed hyperlipidemia: Secondary | ICD-10-CM | POA: Diagnosis not present

## 2024-08-07 DIAGNOSIS — I2510 Atherosclerotic heart disease of native coronary artery without angina pectoris: Secondary | ICD-10-CM | POA: Diagnosis not present

## 2024-08-07 DIAGNOSIS — M545 Low back pain, unspecified: Secondary | ICD-10-CM | POA: Diagnosis not present

## 2024-08-07 DIAGNOSIS — M150 Primary generalized (osteo)arthritis: Secondary | ICD-10-CM | POA: Diagnosis not present

## 2024-08-07 NOTE — Telephone Encounter (Signed)
 PCP removed.

## 2024-08-23 ENCOUNTER — Inpatient Hospital Stay
Admission: EM | Admit: 2024-08-23 | Discharge: 2024-09-01 | DRG: 193 | Disposition: A | Discharge status: Skilled Nursing Facility | Source: Skilled Nursing Facility | Attending: Student in an Organized Health Care Education/Training Program | Admitting: Student in an Organized Health Care Education/Training Program

## 2024-08-23 ENCOUNTER — Emergency Department

## 2024-08-23 ENCOUNTER — Other Ambulatory Visit: Payer: Self-pay

## 2024-08-23 DIAGNOSIS — J09X1 Influenza due to identified novel influenza A virus with pneumonia: Secondary | ICD-10-CM | POA: Diagnosis not present

## 2024-08-23 DIAGNOSIS — Z96653 Presence of artificial knee joint, bilateral: Secondary | ICD-10-CM | POA: Diagnosis present

## 2024-08-23 DIAGNOSIS — Z6839 Body mass index (BMI) 39.0-39.9, adult: Secondary | ICD-10-CM

## 2024-08-23 DIAGNOSIS — E8729 Other acidosis: Secondary | ICD-10-CM | POA: Diagnosis present

## 2024-08-23 DIAGNOSIS — J101 Influenza due to other identified influenza virus with other respiratory manifestations: Secondary | ICD-10-CM | POA: Diagnosis present

## 2024-08-23 DIAGNOSIS — R4182 Altered mental status, unspecified: Secondary | ICD-10-CM

## 2024-08-23 DIAGNOSIS — Z823 Family history of stroke: Secondary | ICD-10-CM

## 2024-08-23 DIAGNOSIS — K279 Peptic ulcer, site unspecified, unspecified as acute or chronic, without hemorrhage or perforation: Secondary | ICD-10-CM

## 2024-08-23 DIAGNOSIS — J1001 Influenza due to other identified influenza virus with the same other identified influenza virus pneumonia: Principal | ICD-10-CM | POA: Diagnosis present

## 2024-08-23 DIAGNOSIS — Z7982 Long term (current) use of aspirin: Secondary | ICD-10-CM

## 2024-08-23 DIAGNOSIS — Z1152 Encounter for screening for COVID-19: Secondary | ICD-10-CM

## 2024-08-23 DIAGNOSIS — N138 Other obstructive and reflux uropathy: Secondary | ICD-10-CM

## 2024-08-23 DIAGNOSIS — E66812 Obesity, class 2: Secondary | ICD-10-CM | POA: Diagnosis present

## 2024-08-23 DIAGNOSIS — J9621 Acute and chronic respiratory failure with hypoxia: Secondary | ICD-10-CM | POA: Diagnosis present

## 2024-08-23 DIAGNOSIS — J209 Acute bronchitis, unspecified: Secondary | ICD-10-CM | POA: Diagnosis present

## 2024-08-23 DIAGNOSIS — Z79899 Other long term (current) drug therapy: Secondary | ICD-10-CM

## 2024-08-23 DIAGNOSIS — F5104 Psychophysiologic insomnia: Secondary | ICD-10-CM | POA: Diagnosis present

## 2024-08-23 DIAGNOSIS — N401 Enlarged prostate with lower urinary tract symptoms: Secondary | ICD-10-CM

## 2024-08-23 DIAGNOSIS — G4733 Obstructive sleep apnea (adult) (pediatric): Secondary | ICD-10-CM | POA: Diagnosis present

## 2024-08-23 DIAGNOSIS — Z66 Do not resuscitate: Secondary | ICD-10-CM | POA: Diagnosis present

## 2024-08-23 DIAGNOSIS — G9341 Metabolic encephalopathy: Secondary | ICD-10-CM | POA: Diagnosis present

## 2024-08-23 DIAGNOSIS — Z8711 Personal history of peptic ulcer disease: Secondary | ICD-10-CM

## 2024-08-23 DIAGNOSIS — M5416 Radiculopathy, lumbar region: Secondary | ICD-10-CM

## 2024-08-23 DIAGNOSIS — Z955 Presence of coronary angioplasty implant and graft: Secondary | ICD-10-CM

## 2024-08-23 DIAGNOSIS — J9622 Acute and chronic respiratory failure with hypercapnia: Secondary | ICD-10-CM | POA: Diagnosis present

## 2024-08-23 DIAGNOSIS — N189 Chronic kidney disease, unspecified: Secondary | ICD-10-CM | POA: Diagnosis present

## 2024-08-23 DIAGNOSIS — I10 Essential (primary) hypertension: Secondary | ICD-10-CM

## 2024-08-23 DIAGNOSIS — Z85828 Personal history of other malignant neoplasm of skin: Secondary | ICD-10-CM

## 2024-08-23 DIAGNOSIS — Z8419 Family history of other disorders of kidney and ureter: Secondary | ICD-10-CM

## 2024-08-23 DIAGNOSIS — I5030 Unspecified diastolic (congestive) heart failure: Secondary | ICD-10-CM | POA: Diagnosis present

## 2024-08-23 DIAGNOSIS — I5032 Chronic diastolic (congestive) heart failure: Secondary | ICD-10-CM | POA: Diagnosis present

## 2024-08-23 DIAGNOSIS — N400 Enlarged prostate without lower urinary tract symptoms: Secondary | ICD-10-CM | POA: Diagnosis present

## 2024-08-23 DIAGNOSIS — I480 Paroxysmal atrial fibrillation: Secondary | ICD-10-CM | POA: Diagnosis present

## 2024-08-23 DIAGNOSIS — I130 Hypertensive heart and chronic kidney disease with heart failure and stage 1 through stage 4 chronic kidney disease, or unspecified chronic kidney disease: Secondary | ICD-10-CM | POA: Diagnosis present

## 2024-08-23 DIAGNOSIS — J9601 Acute respiratory failure with hypoxia: Secondary | ICD-10-CM | POA: Insufficient documentation

## 2024-08-23 DIAGNOSIS — E039 Hypothyroidism, unspecified: Secondary | ICD-10-CM | POA: Diagnosis present

## 2024-08-23 DIAGNOSIS — E785 Hyperlipidemia, unspecified: Secondary | ICD-10-CM | POA: Diagnosis present

## 2024-08-23 DIAGNOSIS — Z8701 Personal history of pneumonia (recurrent): Secondary | ICD-10-CM

## 2024-08-23 DIAGNOSIS — Z888 Allergy status to other drugs, medicaments and biological substances status: Secondary | ICD-10-CM

## 2024-08-23 DIAGNOSIS — R0902 Hypoxemia: Principal | ICD-10-CM

## 2024-08-23 DIAGNOSIS — I2510 Atherosclerotic heart disease of native coronary artery without angina pectoris: Secondary | ICD-10-CM | POA: Diagnosis present

## 2024-08-23 DIAGNOSIS — F0390 Unspecified dementia without behavioral disturbance: Secondary | ICD-10-CM | POA: Diagnosis present

## 2024-08-23 LAB — CBC WITH DIFFERENTIAL/PLATELET
Abs Immature Granulocytes: 0.08 K/uL — ABNORMAL HIGH (ref 0.00–0.07)
Basophils Absolute: 0.2 K/uL — ABNORMAL HIGH (ref 0.0–0.1)
Basophils Relative: 2 %
Eosinophils Absolute: 0.4 K/uL (ref 0.0–0.5)
Eosinophils Relative: 4 %
HCT: 41.9 % (ref 39.0–52.0)
Hemoglobin: 13.6 g/dL (ref 13.0–17.0)
Immature Granulocytes: 1 %
Lymphocytes Relative: 14 %
Lymphs Abs: 1.3 K/uL (ref 0.7–4.0)
MCH: 31.1 pg (ref 26.0–34.0)
MCHC: 32.5 g/dL (ref 30.0–36.0)
MCV: 95.9 fL (ref 80.0–100.0)
Monocytes Absolute: 1.3 K/uL — ABNORMAL HIGH (ref 0.1–1.0)
Monocytes Relative: 14 %
Neutro Abs: 5.9 K/uL (ref 1.7–7.7)
Neutrophils Relative %: 65 %
Platelets: 199 K/uL (ref 150–400)
RBC: 4.37 MIL/uL (ref 4.22–5.81)
RDW: 14.7 % (ref 11.5–15.5)
WBC: 9.1 K/uL (ref 4.0–10.5)
nRBC: 0 % (ref 0.0–0.2)

## 2024-08-23 LAB — URINALYSIS, W/ REFLEX TO CULTURE (INFECTION SUSPECTED)
Bacteria, UA: NONE SEEN
Bilirubin Urine: NEGATIVE
Glucose, UA: NEGATIVE mg/dL
Hgb urine dipstick: NEGATIVE
Ketones, ur: NEGATIVE mg/dL
Leukocytes,Ua: NEGATIVE
Nitrite: NEGATIVE
Protein, ur: NEGATIVE mg/dL
Specific Gravity, Urine: 1.014 (ref 1.005–1.030)
pH: 5 (ref 5.0–8.0)

## 2024-08-23 LAB — COMPREHENSIVE METABOLIC PANEL WITH GFR
ALT: 16 U/L (ref 0–44)
AST: 27 U/L (ref 15–41)
Albumin: 4 g/dL (ref 3.5–5.0)
Alkaline Phosphatase: 78 U/L (ref 38–126)
Anion gap: 11 (ref 5–15)
BUN: 16 mg/dL (ref 8–23)
CO2: 28 mmol/L (ref 22–32)
Calcium: 9.9 mg/dL (ref 8.9–10.3)
Chloride: 99 mmol/L (ref 98–111)
Creatinine, Ser: 1.22 mg/dL (ref 0.61–1.24)
GFR, Estimated: 58 mL/min — ABNORMAL LOW
Glucose, Bld: 155 mg/dL — ABNORMAL HIGH (ref 70–99)
Potassium: 4.1 mmol/L (ref 3.5–5.1)
Sodium: 137 mmol/L (ref 135–145)
Total Bilirubin: 0.5 mg/dL (ref 0.0–1.2)
Total Protein: 7.1 g/dL (ref 6.5–8.1)

## 2024-08-23 LAB — PROCALCITONIN: Procalcitonin: <0.1 ng/mL

## 2024-08-23 LAB — BLOOD GAS, VENOUS
Acid-Base Excess: 7.4 mmol/L — ABNORMAL HIGH (ref 0.0–2.0)
Bicarbonate: 35 mmol/L — ABNORMAL HIGH (ref 20.0–28.0)
O2 Saturation: 63.7 %
Patient temperature: 37
pCO2, Ven: 62 mmHg — ABNORMAL HIGH (ref 44–60)
pH, Ven: 7.36 (ref 7.25–7.43)
pO2, Ven: 35 mmHg (ref 32–45)

## 2024-08-23 LAB — PROTIME-INR
INR: 1 (ref 0.8–1.2)
Prothrombin Time: 13.2 s (ref 11.4–15.2)

## 2024-08-23 LAB — LACTIC ACID, PLASMA
Lactic Acid, Venous: 2.7 mmol/L (ref 0.5–1.9)
Lactic Acid, Venous: 1.8 mmol/L (ref 0.5–1.9)

## 2024-08-23 LAB — RESP PANEL BY RT-PCR (RSV, FLU A&B, COVID)  RVPGX2
Influenza A by PCR: POSITIVE — AB
Influenza B by PCR: NEGATIVE
Resp Syncytial Virus by PCR: NEGATIVE
SARS Coronavirus 2 by RT PCR: NEGATIVE

## 2024-08-23 MED ORDER — TAMSULOSIN HCL 0.4 MG PO CAPS
0.4000 mg | ORAL_CAPSULE | Freq: Every day | ORAL | Status: DC
  Administered 2024-08-24 – 2024-09-01 (×9): 0.4 mg via ORAL
  Filled 2024-08-23 (×2): qty 1

## 2024-08-23 MED ORDER — BUDESONIDE 0.5 MG/2ML IN SUSP
2.0000 mg | Freq: Two times a day (BID) | RESPIRATORY_TRACT | Status: DC
  Administered 2024-08-23: 2 mg via RESPIRATORY_TRACT
  Filled 2024-08-23 (×2): qty 8

## 2024-08-23 MED ORDER — ASPIRIN 81 MG PO TBEC
81.0000 mg | DELAYED_RELEASE_TABLET | Freq: Every day | ORAL | Status: DC
  Administered 2024-08-24 – 2024-09-01 (×9): 81 mg via ORAL
  Filled 2024-08-23 (×2): qty 1

## 2024-08-23 MED ORDER — LACTATED RINGERS IV BOLUS
1000.0000 mL | Freq: Once | INTRAVENOUS | Status: AC
  Administered 2024-08-23: 1000 mL via INTRAVENOUS

## 2024-08-23 MED ORDER — ACETAMINOPHEN 650 MG RE SUPP: 650.0000 mg | Freq: Four times a day (QID) | RECTAL | Status: DC | PRN

## 2024-08-23 MED ORDER — EZETIMIBE 10 MG PO TABS
10.0000 mg | ORAL_TABLET | Freq: Every day | ORAL | Status: DC
  Administered 2024-08-24 – 2024-09-01 (×9): 10 mg via ORAL
  Filled 2024-08-23 (×2): qty 1

## 2024-08-23 MED ORDER — FUROSEMIDE 40 MG PO TABS
40.0000 mg | ORAL_TABLET | Freq: Every day | ORAL | Status: DC
  Administered 2024-08-24 – 2024-09-01 (×9): 40 mg via ORAL
  Filled 2024-08-23 (×2): qty 1

## 2024-08-23 MED ORDER — IPRATROPIUM-ALBUTEROL 0.5-2.5 (3) MG/3ML IN SOLN
3.0000 mL | Freq: Four times a day (QID) | RESPIRATORY_TRACT | Status: DC
  Administered 2024-08-23 – 2024-08-27 (×15): 3 mL via RESPIRATORY_TRACT
  Filled 2024-08-23 (×5): qty 3

## 2024-08-23 MED ORDER — POLYETHYLENE GLYCOL 3350 17 G PO PACK
17.0000 g | PACK | Freq: Every day | ORAL | Status: DC
  Administered 2024-08-24 – 2024-09-01 (×9): 17 g via ORAL
  Filled 2024-08-23 (×2): qty 1

## 2024-08-23 MED ORDER — PANTOPRAZOLE SODIUM 40 MG PO TBEC
40.0000 mg | DELAYED_RELEASE_TABLET | Freq: Every day | ORAL | Status: DC
  Administered 2024-08-24 – 2024-09-01 (×9): 40 mg via ORAL
  Filled 2024-08-23 (×2): qty 1

## 2024-08-23 MED ORDER — ONDANSETRON HCL 4 MG PO TABS: 4.0000 mg | ORAL_TABLET | Freq: Four times a day (QID) | ORAL | Status: DC | PRN

## 2024-08-23 MED ORDER — LINACLOTIDE 145 MCG PO CAPS
290.0000 ug | ORAL_CAPSULE | Freq: Every day | ORAL | Status: DC
  Administered 2024-08-25 – 2024-09-01 (×8): 290 ug via ORAL
  Filled 2024-08-23 (×3): qty 2

## 2024-08-23 MED ORDER — METOPROLOL SUCCINATE ER 50 MG PO TB24
50.0000 mg | ORAL_TABLET | Freq: Every day | ORAL | Status: DC
  Administered 2024-08-24 – 2024-09-01 (×8): 50 mg via ORAL
  Filled 2024-08-23 (×2): qty 1

## 2024-08-23 MED ORDER — ONDANSETRON HCL 4 MG/2ML IJ SOLN: 4.0000 mg | Freq: Four times a day (QID) | INTRAMUSCULAR | Status: DC | PRN

## 2024-08-23 MED ORDER — DOCUSATE SODIUM 100 MG PO CAPS: 100.0000 mg | ORAL_CAPSULE | Freq: Every day | ORAL | Status: DC

## 2024-08-23 MED ORDER — FUROSEMIDE 10 MG/ML IJ SOLN
40.0000 mg | Freq: Once | INTRAMUSCULAR | Status: AC
  Administered 2024-08-23: 40 mg via INTRAVENOUS
  Filled 2024-08-23: qty 4

## 2024-08-23 MED ORDER — LEVOTHYROXINE SODIUM 50 MCG PO TABS
175.0000 ug | ORAL_TABLET | Freq: Every day | ORAL | Status: DC
  Administered 2024-08-24 – 2024-09-01 (×9): 175 ug via ORAL
  Filled 2024-08-23 (×2): qty 1

## 2024-08-23 MED ORDER — FUROSEMIDE 40 MG PO TABS: 40.0000 mg | ORAL_TABLET | Freq: Every day | ORAL | Status: DC

## 2024-08-23 MED ORDER — DULOXETINE HCL 20 MG PO CPEP
20.0000 mg | ORAL_CAPSULE | Freq: Every day | ORAL | Status: DC
  Administered 2024-08-24 – 2024-09-01 (×9): 20 mg via ORAL
  Filled 2024-08-23 (×2): qty 1

## 2024-08-23 MED ORDER — MELATONIN 5 MG PO TABS
10.0000 mg | ORAL_TABLET | Freq: Every day | ORAL | Status: DC
  Administered 2024-08-23 – 2024-08-31 (×9): 10 mg via ORAL
  Filled 2024-08-23 (×2): qty 2

## 2024-08-23 MED ORDER — ICOSAPENT ETHYL 1 G PO CAPS
2.0000 g | ORAL_CAPSULE | Freq: Two times a day (BID) | ORAL | Status: DC
  Administered 2024-08-23 – 2024-09-01 (×18): 2 g via ORAL
  Filled 2024-08-23 (×5): qty 2

## 2024-08-23 MED ORDER — ALBUTEROL SULFATE (2.5 MG/3ML) 0.083% IN NEBU: 2.5000 mg | INHALATION_SOLUTION | RESPIRATORY_TRACT | Status: DC | PRN

## 2024-08-23 MED ORDER — ACETAMINOPHEN 325 MG PO TABS: 650.0000 mg | ORAL_TABLET | Freq: Four times a day (QID) | ORAL | Status: DC | PRN

## 2024-08-23 MED ORDER — ENOXAPARIN SODIUM 80 MG/0.8ML IJ SOSY
70.0000 mg | PREFILLED_SYRINGE | Freq: Every day | INTRAMUSCULAR | Status: DC
  Administered 2024-08-23 – 2024-08-31 (×9): 70 mg via SUBCUTANEOUS
  Filled 2024-08-23 (×3): qty 0.7

## 2024-08-23 MED ORDER — PREDNISONE 20 MG PO TABS
40.0000 mg | ORAL_TABLET | Freq: Every day | ORAL | Status: DC
  Administered 2024-08-24 – 2024-08-27 (×4): 40 mg via ORAL
  Filled 2024-08-23: qty 2

## 2024-08-23 MED ORDER — GABAPENTIN 300 MG PO CAPS
300.0000 mg | ORAL_CAPSULE | Freq: Every day | ORAL | Status: DC
  Administered 2024-08-23 – 2024-08-31 (×9): 300 mg via ORAL
  Filled 2024-08-23 (×2): qty 1

## 2024-08-23 MED ORDER — GUAIFENESIN ER 600 MG PO TB12
600.0000 mg | ORAL_TABLET | Freq: Two times a day (BID) | ORAL | Status: DC
  Administered 2024-08-23 – 2024-09-01 (×19): 600 mg via ORAL
  Filled 2024-08-23 (×5): qty 1

## 2024-08-23 MED ORDER — FINASTERIDE 5 MG PO TABS
5.0000 mg | ORAL_TABLET | Freq: Every day | ORAL | Status: DC
  Administered 2024-08-24 – 2024-09-01 (×9): 5 mg via ORAL
  Filled 2024-08-23 (×2): qty 1

## 2024-08-23 MED ORDER — CLOPIDOGREL BISULFATE 75 MG PO TABS
75.0000 mg | ORAL_TABLET | Freq: Every day | ORAL | Status: DC
  Administered 2024-08-24 – 2024-09-01 (×9): 75 mg via ORAL
  Filled 2024-08-23 (×2): qty 1

## 2024-08-23 MED ORDER — SUCRALFATE 1 G PO TABS
1.0000 g | ORAL_TABLET | Freq: Two times a day (BID) | ORAL | Status: DC
  Administered 2024-08-23 – 2024-09-01 (×17): 1 g via ORAL
  Filled 2024-08-23 (×4): qty 1

## 2024-08-23 NOTE — ED Notes (Signed)
 Dinner tray disposed of. Pt repositioned in bed. Channel changed per pt request. No acute needs at this time. Reconnected to monitor. Cb within reach

## 2024-08-23 NOTE — H&P (Signed)
 " History and Physical    Anthony [name] FMW:[id number] DOB: 09/27/1935 DOA: 08/23/2024  PCP: Pcp, No (Confirm with patient/family/NH records and if not entered, this has to be entered at [facility] point of entry) Patient coming from: SNF  I have personally briefly reviewed patient's old medical records in [facility]  Chief Complaint: Cough, fever, confusion  HPI: Anthony [name] is a 88 y.o. male with medical history significant of PAF status post ablation, HTN, chronic HFpEF, OSA on CPAP at bedtime, hypothyroidism, BPH, morbid obesity, sent from nursing home for evaluation of altered mentation.  Patient has had sore throat, dry cough for last 3 days with cycles of subjective fever and chills.  This morning, patient was found on the floor lethargic and confused.  Nursing home staff checked his pulse ox found hypoxic 86% room air.  Patient denied any abdominal pain diarrhea no chest pain, no urinary symptoms.  ED Course: Temperature 99.3 tachycardia 102 heart rate, blood pressure 160/100 SATURATION 86% on room air stabilized on 2 L.  Chest x-ray negative for acute findings, nasal swab positive for influenza A, lactic acid 2.0 bicarb 28 BUN 28 creatinine 1.0 glucose 155 so WBC 9.1.  CT head negative for acute findings.  Review of Systems: As per HPI otherwise 14 point review of systems negative.    Past Medical History:  Diagnosis Date   Abscess in epidural space of lumbar spine 06/05/2022   Anosmia 1980s   Atrial flutter (HCC) 2018   had an ablation with dr. nechama   Basal cell carcinoma 01/11/2022   L cheek - excised 04/25/22   CAD (coronary artery disease)    Cancer (HCC)    skin cancer   Chronic constipation    Chronic insomnia    Chronic lower back pain    s/p spine stimulator placement   Coag negative Staphylococcus bacteremia    Complication of anesthesia    when under general, he woke up agitated and unable to be held down   Diastolic CHF (HCC)    Discitis of lumbosacral  region    History of diverticulitis 2017   History of pneumonia 2013   Hyperlipidemia    Hypertension    Hypothyroidism    Irregular heart rhythm 07/29/2022   Obesity, Class II, BMI 35-39.9, with comorbidity    OSA on CPAP    Peptic ulcer disease 07/29/2022   PONV (postoperative nausea and vomiting)    Vertebral osteomyelitis (HCC)     Past Surgical History:  Procedure Laterality Date   A-FLUTTER ABLATION N/A 05/07/2017   Procedure: A-Flutter Ablation;  Surgeon: Fernande Elspeth BROCKS, MD;  Location: [facility] INVASIVE CV LAB;  Service: Cardiovascular;  Laterality: N/A;   CARDIOVASCULAR STRESS TEST  11/2017   no ischemia, low risk study   COLONOSCOPY  2007   normal per prior PCP records, rpt 10 yrs (Dr Adelbert)   ELBOW SURGERY Right    ulnar nerve decompression.  PT DOES NOT RECALL THIS PROCEDURE   ENDOVASCULAR STENT GRAFT (AAA) N/A 05/05/2020   Procedure: ENDOVASCULAR REPAIR/STENT GRAFT;  Surgeon: Marea Selinda RAMAN, MD;  Location: [facility] INVASIVE CV LAB;  Service: Cardiovascular;  Laterality: N/A;   ESOPHAGOGASTRODUODENOSCOPY (EGD) WITH PROPOFOL  N/A 05/30/2022   Procedure: ESOPHAGOGASTRODUODENOSCOPY (EGD) WITH PROPOFOL ;  Surgeon: Therisa Bi, MD;  Location: [facility] ENDOSCOPY;  Service: Gastroenterology;  Laterality: N/A;   LAMINECTOMY THORACIC SPINE W/ PLACEMENT SPINAL CORD STIMULATOR  08/2018   and removal of prior spine stimulator (Dr Bluford)   LUMBAR LAMINECTOMY  FOR EPIDURAL ABSCESS Bilateral 06/05/2022   Procedure: LUMBAR 2- L3, L5-S1 LAMINECTOMY FOR EPIDURAL ABSCESS;  Surgeon: Clois Fret, MD;  Location: [facility] ORS;  Service: Neurosurgery;  Laterality: Bilateral;   NASAL SINUS SURGERY     nasal polyps. done a long time ago   PERCUTANEOUS CORONARY STENT INTERVENTION (PCI-S)  2013   EF55%, 70% mid LAD, 99% mid RCA, mild MR, elev LVEDP, DES to mid LAD. RCA is nondominant   RADIOFREQUENCY ABLATION  06/2017   lumbar region [facility])    REPLACEMENT TOTAL KNEE BILATERAL Bilateral 2000s   SPINAL CORD  STIMULATOR IMPLANT  06/2016   SPINAL CORD STIMULATOR INSERTION N/A 09/09/2018   Procedure: LUMBAR SPINAL CORD STIMULATOR LEAD AND BATTERY REMOVAL AND LAMINECTOMY FOR PLACEMENT OF PADDLE;  Surgeon: Bluford Standing, MD;  Location: [facility] ORS;  Service: Neurosurgery;  Laterality: N/A;   SPINAL CORD STIMULATOR INSERTION N/A 09/16/2018   Procedure: PLACEMENT OF SPINAL CORD STIMULATOR BATTERY;  Surgeon: Bluford Standing, MD;  Location: [facility] ORS;  Service: Neurosurgery;  Laterality: N/A;   SPINAL CORD STIMULATOR REMOVAL N/A 06/03/2022   Procedure: LUMBAR SPINAL CORD STIMULATOR REMOVAL;  Surgeon: Clois Fret, MD;  Location: [facility] ORS;  Service: Neurosurgery;  Laterality: N/A;     reports that he has never smoked. He has never been exposed to tobacco smoke. He has never used smokeless tobacco. He reports that he does not drink alcohol and does not use drugs.  Allergies[1]  Family History  Problem Relation Age of Onset   Stroke Mother    Kidney disease Father    Leukemia Brother    Stroke Sister    Diabetes Neg Hx      Prior to Admission medications  Medication Sig Start Date End Date Taking? Authorizing Provider  acetaminophen  (TYLENOL ) 500 MG tablet Take 1 tablet (500 mg total) by mouth 2 (two) times daily as needed for moderate pain. 11/06/19   Rilla Baller, MD  Alirocumab  (PRALUENT ) 75 MG/ML SOAJ ADMINISTER 75MG  UNDER THE SKIN EVERY 14 DAYS 05/20/24   Gollan, Timothy J, MD  aspirin  EC 81 MG tablet Take 1 tablet (81 mg total) by mouth daily. 11/05/18   Rilla Baller, MD  ciclopirox  (PENLAC ) 8 % solution APPLY TOPICALLY AT BEDTIME APPLY OVER NAIL AND SURROUNDING SKIN, APPLY DAILY OVER PREVIOUS COAT, AFTER 7 DAYS MAY REMOVE WITH ALCOHOL AND REPEAT Patient not taking: Reported on 05/20/2024 02/06/22   Hester Alm BROCKS, MD  clopidogrel  (PLAVIX ) 75 MG tablet Take 1 tablet (75 mg total) by mouth daily. 10/23/23   Dew, Jason S, MD  Cyanocobalamin  (B-12) 1000 MCG SUBL Place 1 tablet under the tongue  daily. 09/02/22   Rilla Baller, MD  diphenhydramine -acetaminophen  (TYLENOL  PM) 25-500 MG TABS tablet Take 1 tablet by mouth at bedtime. 08/10/23   Rilla Baller, MD  docusate sodium  (COLACE) 100 MG capsule Take 100 mg by mouth daily.    [provider]  DULoxetine  (CYMBALTA ) 20 MG capsule Take 1 capsule (20 mg total) by mouth daily. 07/26/22   Rilla Baller, MD  ezetimibe  (ZETIA ) 10 MG tablet Take 1 tablet (10 mg total) by mouth daily. 12/28/21   Rilla Baller, MD  finasteride  (PROSCAR ) 5 MG tablet TAKE 1 TABLET(5 MG) BY MOUTH DAILY 08/10/23   Rilla Baller, MD  fluocinolone (SYNALAR) 0.025 % cream Apply topically 2 (two) times daily as needed.    [provider]  furosemide  (LASIX ) 40 MG tablet Take 1 tablet (40 mg total) by mouth daily. 08/10/23   Rilla Baller, MD  gabapentin  (NEURONTIN ) 300 MG capsule TAKE 2 CAPSULE BY MOUTH AT BEDTIME WITH EXTRA CAPSULE DURING THE DAY AS NEEDED 08/10/23   Rilla Baller, MD  hydrocortisone  2.5 % cream APPLY TO FACE AT BEDTIME TUES, THURS, SAT for seborrheic dermatitis 04/08/24   Hester Alm BROCKS, MD  ketoconazole  (NIZORAL ) 2 % cream APPLY TO FACE AT BEDTIME MON, WED, FRI for seborrheic dermatitis 04/08/24   Hester Alm BROCKS, MD  ketoconazole  (NIZORAL ) 2 % shampoo apply three times per week, massage and lather into scalp and behind ears and leave in for 10 minutes before rinsing out  on Monday, Wed, Friday for seborrheic dermatitis 04/08/24   Hester Alm BROCKS, MD  levothyroxine  (SYNTHROID ) 175 MCG tablet Take 1 tablet (175 mcg total) by mouth daily before breakfast. 05/02/22   Rilla Baller, MD  lidocaine  4 % Place 1 patch onto the skin daily. OTC    [provider]  LINZESS  290 MCG CAPS capsule TAKE 1 CAPSULE(290 MCG) BY MOUTH DAILY BEFORE BREAKFAST 04/18/22   Rilla Baller, MD  losartan  (COZAAR ) 25 MG tablet Take 1 tablet (25 mg total) by mouth daily. Patient not taking: Reported on 05/20/2024 10/24/23    Rilla Baller, MD  Melatonin 10 MG CAPS Take 10 mg by mouth at bedtime.    [provider]  meloxicam  (MOBIC ) 7.5 MG tablet Take 7.5 mg by mouth daily. 08/29/22   [provider]  metoprolol  succinate (TOPROL -XL) 50 MG 24 hr tablet Take 1 tablet (50 mg total) by mouth daily. 08/10/23   Rilla Baller, MD  mometasone  (ELOCON ) 0.1 % cream Apply 1 Application topically every Monday, Wednesday, and Friday. bedtime 07/01/23   [provider]  mometasone  (ELOCON ) 0.1 % lotion apply topically to areas behind ears and scalp nightly and leave on over night for seborrheic dermatitis 04/08/24   Hester Alm BROCKS, MD  Multiple Vitamins-Minerals (CENTRUM SILVER PO) Take 1 tablet by mouth daily. Patient not taking: Reported on 05/20/2024    [provider]  Multiple Vitamins-Minerals (PRESERVISION AREDS 2 PO) Take 1 tablet by mouth daily.    [provider]  nitroGLYCERIN  (NITROSTAT ) 0.4 MG SL tablet Place 1 tablet (0.4 mg total) under the tongue every 5 (five) minutes as needed for chest pain. 12/01/17   Rilla Baller, MD  ondansetron  (ZOFRAN ) 4 MG tablet Take 4 mg by mouth every 6 (six) hours as needed for nausea. Patient not taking: Reported on 05/20/2024    [provider]  Oxycodone  HCl 10 MG TABS Take 1 tablet (10 mg total) by mouth 2 (two) times daily as needed (moderate pain). Patient not taking: Reported on 05/20/2024 02/07/23   Rilla Baller, MD  pantoprazole  (PROTONIX ) 40 MG tablet Take 1 tablet (40 mg total) by mouth daily. 08/10/23   Rilla Baller, MD  polyethylene glycol (MIRALAX  / GLYCOLAX ) 17 g packet Take 17 g by mouth daily.    [provider]  senna (SENOKOT) 8.6 MG tablet Take 1 tablet (8.6 mg total) by mouth daily. Patient not taking: Reported on 05/20/2024 04/06/21   Rilla Baller, MD  sucralfate  (CARAFATE ) 1 g tablet Take 1 g by mouth 2 (two) times daily.    [provider]  tamsulosin  (FLOMAX ) 0.4 MG CAPS  capsule Take 1 capsule (0.4 mg total) by mouth daily. 08/10/23   Rilla Baller, MD  tizanidine  (ZANAFLEX ) 2 MG capsule Take 2 mg by mouth 2 (two) times daily as needed for muscle spasms. Patient not taking: Reported on 05/20/2024    [provider]  VASCEPA  1 g capsule Take 2 capsules (2 g total) by mouth 2 (two) times daily. 05/20/24   Gollan, Timothy J, MD  Vitamin D , Cholecalciferol , 25 MCG (1000 UT) TABS Take 1 tablet by mouth daily. Patient not taking: Reported on 05/20/2024    [provider]    Physical Exam: Vitals:   08/23/24 1211 08/23/24 1215 08/23/24 1230 08/23/24 1231  BP:  (!) 159/118 (!) 165/147   Pulse:   97   Resp:   19   Temp:      TempSrc:      SpO2:   98%   Weight:    (!) 137.8 kg  Height: 6' 2 (1.88 m)       Constitutional: NAD, calm, comfortable Vitals:   08/23/24 1211 08/23/24 1215 08/23/24 1230 08/23/24 1231  BP:  (!) 159/118 (!) 165/147   Pulse:   97   Resp:   19   Temp:      TempSrc:      SpO2:   98%   Weight:    (!) 137.8 kg  Height: 6' 2 (1.88 m)      Eyes: PERRL, lids and conjunctivae normal ENMT: Mucous membranes are moist. Posterior pharynx clear of any exudate or lesions.Normal dentition.  Neck: normal, supple, no masses, no thyromegaly Respiratory: Diminished breath sound bilaterally, diffused wheezing, no crackles, increasing respiratory effort. No accessory muscle use.  Cardiovascular: Regular rate and rhythm, no murmurs / rubs / gallops.  1+ extremity edema. 2+ pedal pulses. No carotid bruits.  Abdomen: no tenderness, no masses palpated. No hepatosplenomegaly. Bowel sounds positive.  Musculoskeletal: no clubbing / cyanosis. No joint deformity upper and lower extremities. Good ROM, no contractures. Normal muscle tone.  Skin: no rashes, lesions, ulcers. No induration Neurologic: CN 2-12 grossly intact. Sensation intact, DTR normal. Strength 5/5 in all 4.  Psychiatric: Normal judgment and insight. Alert and oriented x 3.  Normal mood.     Labs on Admission: I have personally reviewed following labs and imaging studies  CBC: Recent Labs  Lab 08/23/24 1220  WBC 9.1  NEUTROABS 5.9  HGB 13.6  HCT 41.9  MCV 95.9  PLT 199   Basic Metabolic Panel: Recent Labs  Lab 08/23/24 1220  NA 137  K 4.1  CL 99  CO2 28  GLUCOSE 155*  BUN 16  CREATININE 1.22  CALCIUM 9.9   GFR: Estimated Creatinine Clearance: 64.2 mL/min (by C-G formula based on SCr of 1.22 mg/dL). Liver Function Tests: Recent Labs  Lab 08/23/24 1220  AST 27  ALT 16  ALKPHOS 78  BILITOT 0.5  PROT 7.1  ALBUMIN 4.0   No results for input(s): LIPASE, AMYLASE in the last 168 hours. No results for input(s): AMMONIA in the last 168 hours. Coagulation Profile: Recent Labs  Lab 08/23/24 1220  INR 1.0   Cardiac Enzymes: No results for input(s): CKTOTAL, CKMB, CKMBINDEX, TROPONINI in the last 168 hours. BNP (last 3 results) No results for input(s): PROBNP in the last 8760 hours. HbA1C: No results for input(s): HGBA1C in the last 72 hours. CBG: No results for input(s): GLUCAP in the last 168 hours. Lipid Profile: No results for input(s): CHOL, HDL, LDLCALC, TRIG, CHOLHDL, LDLDIRECT in the last 72 hours. Thyroid  Function Tests: No results for input(s): TSH, T4TOTAL, FREET4, T3FREE, THYROIDAB in the last 72 hours. Anemia Panel: No results for input(s): VITAMINB12, FOLATE, FERRITIN, TIBC, IRON, RETICCTPCT in the last 72 hours. Urine analysis:    Component Value Date/Time  COLORURINE YELLOW (A) 08/17/2023 1100   APPEARANCEUR CLEAR (A) 08/17/2023 1100   LABSPEC 1.021 08/17/2023 1100   PHURINE 7.0 08/17/2023 1100   GLUCOSEU NEGATIVE 08/17/2023 1100   HGBUR NEGATIVE 08/17/2023 1100   BILIRUBINUR NEGATIVE 08/17/2023 1100   BILIRUBINUR negative 07/25/2023 1702   KETONESUR NEGATIVE 08/17/2023 1100   PROTEINUR 100 (A) 08/17/2023 1100   UROBILINOGEN 0.2 07/25/2023 1702    NITRITE NEGATIVE 08/17/2023 1100   LEUKOCYTESUR TRACE (A) 08/17/2023 1100    Radiological Exams on Admission: DG Chest Port 1 View Result Date: 08/23/2024 CLINICAL DATA:  Questionable sepsis - evaluate for abnormality EXAM: PORTABLE CHEST - 1 VIEW COMPARISON:  08/17/2023 FINDINGS: Cardiomediastinal silhouette and pulmonary vasculature are within normal limits. Lungs are clear. Advanced degenerative changes the LEFT glenohumeral joint. IMPRESSION: No acute cardiopulmonary process. Electronically Signed   By: Aliene Lloyd M.D.   On: 08/23/2024 13:34   CT HEAD WO CONTRAST ( ) Result Date: 08/23/2024 EXAM: CT HEAD WITHOUT CONTRAST 08/23/2024 12:51:59 PM TECHNIQUE: CT of the head was performed without the administration of intravenous contrast. Automated exposure control, iterative reconstruction, and/or weight based adjustment of the mA/kV was utilized to reduce the radiation dose to as low as reasonably achievable. COMPARISON: 08/17/2023 CLINICAL HISTORY: found down, altered FINDINGS: BRAIN AND VENTRICLES: No acute hemorrhage. No evidence of acute infarct. No hydrocephalus. No extra-axial collection. No mass effect or midline shift. Patchy white matter hypodensities, compatible with moderate chronic microvascular ischemic changes. Mild generalized parenchymal volume loss. Calcific atherosclerosis. ORBITS: No acute abnormality. SINUSES: Scattered mucosal thickening in the ethmoid sinuses and maxillary sinuses with possible small mucous retention cysts. Prior endoscopic sinus surgery. SOFT TISSUES AND SKULL: No acute soft tissue abnormality. No skull fracture. IMPRESSION: 1. No acute intracranial abnormality. 2. Moderate chronic microvascular ischemic changes. 3. Mild generalized parenchymal volume loss. Electronically signed by: Donnice Mania MD 08/23/2024 01:10 PM EST RP Workstation: [id number]    EKG: Independently reviewed.  Sinus rhythm, no acute ST changes.  Assessment/Plan Principal Problem:    Influenza A with pneumonia Active Problems:   Influenza A   Acute respiratory failure with hypoxia (HCC)  (please populate well all problems here in Problem List. (For example, if patient is on BP meds at home and you resume or decide to hold them, it is a problem that needs to be her. Same for CAD, COPD, HLD and so on)  Acute hypoxic respiratory failure Influenza A pneumonia Acute bronchitis - Appears that patient started to have URI symptoms since 3 days ago, although window for Tamiflu . - Supportive care, p.o. prednisone  -VBG to rule out CO2 retention- DuoNebs and as needed albuterol  - Patient has no leukocytosis or significant infiltrate on x-ray, monitor off antibiotics. - Incentive spirometry - Other DDx, clinically appears patient has mild fluid overload, unclear whether this happened after patient received IV bolus versus this presented before he came to the hospital.  Will give 1 dose of IV Lasix  and resume home dose of p.o. Lasix  tomorrow.  HTN Chronic HFpEF - As above.  Hypothyroidism - Continue Synthroid   Morbid obesity - BMI= 39 - Calorie control recommended  OSA - CPAP at bedtime  DVT prophylaxis: Lovenox  Code Status: DNR/DNI Family Communication: None at bedside Disposition Plan: Expect less than 2 midnight hospital stay Consults called: None Admission status: Telemetry observation   Cort ONEIDA Mana MD [facility] Pager [phone]  08/23/2024, 2:27 PM       [1]  Allergies Allergen Reactions   Questran [Cholestyramine]  Patient not aware of an allergy to this medicine.   Atorvastatin     Muscle pain   Gemfibrozil Other (See Comments)    Muscle pain.   Metformin And Related     Dizziness   Rosuvastatin     Muscle pain   "

## 2024-08-23 NOTE — ED Provider Notes (Signed)
 "  [facility] Provider Note    Event Date/Time   First MD Initiated Contact with Patient 08/23/24 1211     (approximate)   History   Altered Mental Status   HPI  Anthony [name] is a 88 y.o. male who presents to the ED for evaluation of Altered Mental Status   I review a cardiology clinic visit from September.  History of PAD and CAD, atrial flutter, OSA, CKD.  Dementia and resides at a local SNF.  Aspirin  and Plavix  without AC  Patient presents to the ED with altered mentation.  He is unable to provide much relevant history due to confusion, disorientation.  He reports he feels fine and is not sure why he is here  EMS reports being called to his SNF for a lift assist.  They found him confused and staff reportedly said that he is typically alert and oriented.  Had an elevated temperature and so was brought to the ED.  On arrival he is notably hypoxic, tachypneic with a cough and borderline elevated temperature   Physical Exam   Triage Vital Signs: ED Triage Vitals  Encounter Vitals Group     BP 08/23/24 1215 (!) 159/118     Girls Systolic BP Percentile --      Girls Diastolic BP Percentile --      Boys Systolic BP Percentile --      Boys Diastolic BP Percentile --      Pulse Rate 08/23/24 1209 (!) 102     Resp 08/23/24 1209 (!) 25     Temp 08/23/24 1209 99.3 F (37.4 C)     Temp Source 08/23/24 1209 Oral     SpO2 08/23/24 1209 (!) 86 %     Weight --      Height 08/23/24 1211 6' 2 (1.88 m)     Head Circumference --      Peak Flow --      Pain Score 08/23/24 1211 0     Pain Loc --      Pain Education --      Exclude from Growth Chart --     Most recent vital signs: Vitals:   08/23/24 1215 08/23/24 1230  BP: (!) 159/118 (!) 165/147  Pulse:  97  Resp:  19  Temp:    SpO2:  98%    General: Awake, no distress.  CV:  Good peripheral perfusion.  Resp:  Mild tachypnea in the low 20s, no distress Abd:  No distention.  MSK:  No deformity  noted.  Neuro:  No focal deficits appreciated. Other:     ED Results / Procedures / Treatments   Labs (all labs ordered are listed, but only abnormal results are displayed) Labs Reviewed  RESP PANEL BY RT-PCR (RSV, FLU A&B, COVID)  RVPGX2 - Abnormal; Notable for the following components:      Result Value   Influenza A by PCR POSITIVE (*)    All other components within normal limits  LACTIC ACID, PLASMA - Abnormal; Notable for the following components:   Lactic Acid, Venous 2.7 (*)    All other components within normal limits  COMPREHENSIVE METABOLIC PANEL WITH GFR - Abnormal; Notable for the following components:   Glucose, Bld 155 (*)    GFR, Estimated 58 (*)    All other components within normal limits  CBC WITH DIFFERENTIAL/PLATELET - Abnormal; Notable for the following components:   Monocytes Absolute 1.3 (*)    Basophils Absolute 0.2 (*)  Abs Immature Granulocytes 0.08 (*)    All other components within normal limits  CULTURE, BLOOD (ROUTINE X 2)  CULTURE, BLOOD (ROUTINE X 2)  PROTIME-INR  PROCALCITONIN  LACTIC ACID, PLASMA  URINALYSIS, W/ REFLEX TO CULTURE (INFECTION SUSPECTED)    EKG Sinus rhythm at a rate of 97 bpm.   leftward axis, left anterior fascicular block, no STEMI  RADIOLOGY CXR interpreted by me without evidence of acute cardiopulmonary pathology. CT head interpreted by me without evidence of acute intracranial pathology  Official radiology report(s): DG Chest Port 1 View Result Date: 08/23/2024 CLINICAL DATA:  Questionable sepsis - evaluate for abnormality EXAM: PORTABLE CHEST - 1 VIEW COMPARISON:  08/17/2023 FINDINGS: Cardiomediastinal silhouette and pulmonary vasculature are within normal limits. Lungs are clear. Advanced degenerative changes the LEFT glenohumeral joint. IMPRESSION: No acute cardiopulmonary process. Electronically Signed   By: Aliene Lloyd M.D.   On: 08/23/2024 13:34   CT HEAD WO CONTRAST ( ) Result Date: 08/23/2024 EXAM: CT  HEAD WITHOUT CONTRAST 08/23/2024 12:51:59 PM TECHNIQUE: CT of the head was performed without the administration of intravenous contrast. Automated exposure control, iterative reconstruction, and/or weight based adjustment of the mA/kV was utilized to reduce the radiation dose to as low as reasonably achievable. COMPARISON: 08/17/2023 CLINICAL HISTORY: found down, altered FINDINGS: BRAIN AND VENTRICLES: No acute hemorrhage. No evidence of acute infarct. No hydrocephalus. No extra-axial collection. No mass effect or midline shift. Patchy white matter hypodensities, compatible with moderate chronic microvascular ischemic changes. Mild generalized parenchymal volume loss. Calcific atherosclerosis. ORBITS: No acute abnormality. SINUSES: Scattered mucosal thickening in the ethmoid sinuses and maxillary sinuses with possible small mucous retention cysts. Prior endoscopic sinus surgery. SOFT TISSUES AND SKULL: No acute soft tissue abnormality. No skull fracture. IMPRESSION: 1. No acute intracranial abnormality. 2. Moderate chronic microvascular ischemic changes. 3. Mild generalized parenchymal volume loss. Electronically signed by: Donnice Mania MD 08/23/2024 01:10 PM EST RP Workstation: [id number]    PROCEDURES and INTERVENTIONS:  .1-3 Lead EKG Interpretation  Performed by: Claudene Rover, MD Authorized by: Claudene Rover, MD     Interpretation: normal     ECG rate:  95   ECG rate assessment: normal     Rhythm: sinus rhythm     Ectopy: none     Conduction: normal   .Critical Care  Performed by: Claudene Rover, MD Authorized by: Claudene Rover, MD   Critical care provider statement:    Critical care time (minutes):  30   Critical care time was exclusive of:  Separately billable procedures and treating other patients   Critical care was necessary to treat or prevent imminent or life-threatening deterioration of the following conditions:  Respiratory failure   Critical care was time spent personally by me on the  following activities:  Development of treatment plan with patient or surrogate, discussions with consultants, evaluation of patient's response to treatment, examination of patient, ordering and review of laboratory studies, ordering and review of radiographic studies, ordering and performing treatments and interventions, pulse oximetry, re-evaluation of patient's condition and review of old charts   Medications  lactated ringers  bolus 1,000 mL (1,000 mLs Intravenous New Bag/Given 08/23/24 1336)     IMPRESSION / MDM / ASSESSMENT AND PLAN / ED COURSE  I reviewed the triage vital signs and the nursing notes.  Differential diagnosis includes, but is not limited to, ACS, PTX, PNA, muscle strain/spasm, PE, dissection, anxiety, pleural effusion  {Patient presents with symptoms of an acute illness or injury that is potentially life-threatening.  Patient presents  to the ED from local SNF with signs of hypoxia and metabolic encephalopathy from influenza requiring medical admission.  Hypoxic to the low/mid 80s with a good waveform on room air.  Rectified with 2 L nasal cannula.  Does have an elevated lactic acid which we will trend but no other signs of bacterial infection at this point.  Negative procalcitonin, clear CXR, normal WBC and essentially normal metabolic panel.  Consult medicine for admission considering associated hypoxia and nonfocal encephalopathy      FINAL CLINICAL IMPRESSION(S) / ED DIAGNOSES   Final diagnoses:  Hypoxia  Influenza A  Altered mental status, unspecified altered mental status type     Rx / DC Orders   ED Discharge Orders     None        Note:  This document was prepared using Dragon voice recognition software and may include unintentional dictation errors.   Claudene Rover, MD 08/23/24 1354  "

## 2024-08-23 NOTE — ED Notes (Signed)
Fall bundle in place

## 2024-08-23 NOTE — ED Notes (Signed)
 Pt set up for dinner.

## 2024-08-23 NOTE — ED Notes (Signed)
 Pt attempting to get out of bed. [place] reapplied and pt repositioned. Pt was easily redirectable back into bed.

## 2024-08-23 NOTE — ED Notes (Signed)
Pt given drink and TV remote.

## 2024-08-23 NOTE — ED Notes (Addendum)
 Pt brought back from Ct without being connected back to oxygen. Pt satting in the lower 80's. Pt found halfway off bed, despite fall alarm on. Pt reapplied to 02 satting at 95%. Repositioned and boosted by this RN and Fredderick, CHARITY FUNDRAISER. Pt tolerated well. Fall alarm pad reinforced by this RN.

## 2024-08-23 NOTE — ED Triage Notes (Addendum)
 Pt BIB [facility] from [place] for a lift assist. Pt appeared altered with EMS. Usually AXO 4 and ambulatory. Unwitnessed fall. Staff are unknown if he fell or sat down. Presents to the ED with hoarse voice and cough. RA sat 86%, placed on 2l at 94%  99.4 Axillary 103 HR 130/90 164 CBG

## 2024-08-23 NOTE — ED Notes (Addendum)
 New purewick applied. Pt redirected and reminded to stay in bed and use call light. NT tech at bedside. Cb within reach. Warm blankets given. Fall bundle checked and in place

## 2024-08-23 NOTE — ED Notes (Signed)
 Pt keeps pulling off leads despite verbal instruction to keep them on

## 2024-08-23 NOTE — ED Notes (Signed)
 Pt changed into brief, male pure wick applied. Pt repositioned and warm blanket given. No acute needs at this time.

## 2024-08-23 NOTE — ED Notes (Signed)
 Pt attempting to get out of bed. Pt reminded that he has a pure wick. Pt redirected back into bed. Fall alarm still in place.

## 2024-08-23 NOTE — Consult Note (Signed)
 PHARMACIST - PHYSICIAN COMMUNICATION  CONCERNING:  Enoxaparin  (Lovenox ) for DVT Prophylaxis    RECOMMENDATION: Patient was prescribed enoxaprin 40mg  q24 hours for VTE prophylaxis.   Filed Weights   08/23/24 1231  Weight: (!) 137.8 kg (303 lb 12.7 oz)    Body mass index is 39 kg/m.  Estimated Creatinine Clearance: 64.2 mL/min (by C-G formula based on SCr of 1.22 mg/dL).   Based on [facility] policy patient is candidate for enoxaparin  0.5mg /kg TBW SQ every 24 hours based on BMI being >30.  DESCRIPTION: Pharmacy has adjusted enoxaparin  dose per [facility] policy.  Patient is now receiving enoxaparin  70 mg every 24 hours    Annabella LOISE Banks, PharmD Clinical Pharmacist  08/23/2024 2:23 PM

## 2024-08-24 DIAGNOSIS — F0390 Unspecified dementia without behavioral disturbance: Secondary | ICD-10-CM | POA: Diagnosis present

## 2024-08-24 DIAGNOSIS — Z7982 Long term (current) use of aspirin: Secondary | ICD-10-CM | POA: Diagnosis not present

## 2024-08-24 DIAGNOSIS — J9622 Acute and chronic respiratory failure with hypercapnia: Secondary | ICD-10-CM | POA: Diagnosis present

## 2024-08-24 DIAGNOSIS — N189 Chronic kidney disease, unspecified: Secondary | ICD-10-CM | POA: Diagnosis present

## 2024-08-24 DIAGNOSIS — Z66 Do not resuscitate: Secondary | ICD-10-CM | POA: Diagnosis present

## 2024-08-24 DIAGNOSIS — Z85828 Personal history of other malignant neoplasm of skin: Secondary | ICD-10-CM | POA: Diagnosis not present

## 2024-08-24 DIAGNOSIS — I130 Hypertensive heart and chronic kidney disease with heart failure and stage 1 through stage 4 chronic kidney disease, or unspecified chronic kidney disease: Secondary | ICD-10-CM | POA: Diagnosis present

## 2024-08-24 DIAGNOSIS — J09X1 Influenza due to identified novel influenza A virus with pneumonia: Secondary | ICD-10-CM | POA: Diagnosis not present

## 2024-08-24 DIAGNOSIS — I2510 Atherosclerotic heart disease of native coronary artery without angina pectoris: Secondary | ICD-10-CM | POA: Diagnosis present

## 2024-08-24 DIAGNOSIS — G4733 Obstructive sleep apnea (adult) (pediatric): Secondary | ICD-10-CM | POA: Diagnosis present

## 2024-08-24 DIAGNOSIS — E66812 Obesity, class 2: Secondary | ICD-10-CM | POA: Diagnosis present

## 2024-08-24 DIAGNOSIS — E039 Hypothyroidism, unspecified: Secondary | ICD-10-CM | POA: Diagnosis present

## 2024-08-24 DIAGNOSIS — Z6839 Body mass index (BMI) 39.0-39.9, adult: Secondary | ICD-10-CM | POA: Diagnosis not present

## 2024-08-24 DIAGNOSIS — E8729 Other acidosis: Secondary | ICD-10-CM | POA: Diagnosis present

## 2024-08-24 DIAGNOSIS — I5032 Chronic diastolic (congestive) heart failure: Secondary | ICD-10-CM | POA: Diagnosis present

## 2024-08-24 DIAGNOSIS — J101 Influenza due to other identified influenza virus with other respiratory manifestations: Secondary | ICD-10-CM | POA: Diagnosis present

## 2024-08-24 DIAGNOSIS — G9341 Metabolic encephalopathy: Secondary | ICD-10-CM

## 2024-08-24 DIAGNOSIS — I480 Paroxysmal atrial fibrillation: Secondary | ICD-10-CM | POA: Diagnosis present

## 2024-08-24 DIAGNOSIS — N400 Enlarged prostate without lower urinary tract symptoms: Secondary | ICD-10-CM | POA: Diagnosis present

## 2024-08-24 DIAGNOSIS — J1001 Influenza due to other identified influenza virus with the same other identified influenza virus pneumonia: Secondary | ICD-10-CM | POA: Diagnosis present

## 2024-08-24 DIAGNOSIS — E785 Hyperlipidemia, unspecified: Secondary | ICD-10-CM | POA: Diagnosis present

## 2024-08-24 DIAGNOSIS — J9621 Acute and chronic respiratory failure with hypoxia: Secondary | ICD-10-CM | POA: Diagnosis present

## 2024-08-24 DIAGNOSIS — Z1152 Encounter for screening for COVID-19: Secondary | ICD-10-CM | POA: Diagnosis not present

## 2024-08-24 DIAGNOSIS — Z955 Presence of coronary angioplasty implant and graft: Secondary | ICD-10-CM | POA: Diagnosis not present

## 2024-08-24 DIAGNOSIS — M5416 Radiculopathy, lumbar region: Secondary | ICD-10-CM | POA: Diagnosis present

## 2024-08-24 LAB — MRSA NEXT GEN BY PCR, NASAL: MRSA by PCR Next Gen: NOT DETECTED

## 2024-08-24 MED ORDER — OSELTAMIVIR PHOSPHATE 75 MG PO CAPS
75.0000 mg | ORAL_CAPSULE | Freq: Two times a day (BID) | ORAL | Status: AC
  Administered 2024-08-24 – 2024-08-28 (×10): 75 mg via ORAL
  Filled 2024-08-24 (×2): qty 1

## 2024-08-24 MED ORDER — LOSARTAN POTASSIUM 50 MG PO TABS
100.0000 mg | ORAL_TABLET | Freq: Every day | ORAL | Status: DC
  Administered 2024-08-24 – 2024-09-01 (×9): 100 mg via ORAL
  Filled 2024-08-24 (×2): qty 2

## 2024-08-24 MED ORDER — BUDESONIDE 0.5 MG/2ML IN SUSP
0.5000 mg | Freq: Two times a day (BID) | RESPIRATORY_TRACT | Status: DC
  Administered 2024-08-24 – 2024-08-25 (×2): 0.5 mg via RESPIRATORY_TRACT
  Filled 2024-08-24: qty 2

## 2024-08-24 NOTE — Progress Notes (Signed)
 Mobility Specialist - Progress Note     08/24/24 1529  Mobility  Activity Stood at bedside;Dangled on edge of bed;Respositioned in chair;Pivoted/transferred from bed to chair  Level of Assistance Moderate assist, patient does 50-74%  Assistive Device Front wheel walker  Distance Ambulated (ft) 4 ft  Range of Motion/Exercises Active  Activity Response Tolerated well  Mobility Referral Yes  Mobility visit 1 Mobility   Pt resting in bed on RA upon entry attempting to get out of bed. Pt STS and transfers to reclined ModA with RW on standby and HHA with MS. Pt donned O2 while in recliner (3L) 95%. Pt left in recliner with needs in reach and chair alarm activated.   Guido Rumble Mobility Specialist 08/24/2024, 4:21 PM

## 2024-08-24 NOTE — ED Notes (Signed)
 Respiratory was called for transport, she said to take the mask off him and turn off the machine, she was also asked if he would need supplemental O2 for transport to which she said no he would be fine and they would meet him up there.

## 2024-08-24 NOTE — Evaluation (Signed)
 Physical Therapy Evaluation Patient Details Name: Anthony [name] MRN: [id number] DOB: Mar 26, 1936 Today's Date: 08/24/2024  History of Present Illness  88 y/o male presented to ED on 08/23/24 for AMS after EMS called for lift assist at [facility] after unwitnessed fall. Admitted for acute hypoxic respiratory failure 2/2 Influenza A pneumonia. PMH: PAF s/p ablation, HTN, chronic HFpEF, OSA on CPAP, morbid obesity, BPH.  Clinical Impression  Patient admitted with the above. PTA, patient lives at [facility], however unsure of PLOF as patient is poor historian. On arrival, patient with  off in lap with spO2 84%. Donned 3L with spO2 87-92% throughout session. Patient resourceful and utilizing cues in room to answer orientation questions regarding place and date. Follows one step commands with increased time. Able to complete bed mobility with minA. Stood from EOB x 3 with maxA and RW, however with posterior bias in standing and only able to stand ~10 seconds each time prior to returning to sitting. Patient easily fatigued. Patient will benefit from skilled PT services during acute stay to address listed deficits. Patient will benefit from ongoing therapy at discharge to maximize functional independence and safety.       If plan is discharge home, recommend the following: A lot of help with walking and/or transfers;A lot of help with bathing/dressing/bathroom;Direct supervision/assist for medications management;Direct supervision/assist for financial management;Supervision due to cognitive status   Can travel by private vehicle   No    Equipment Recommendations Other (comment) (TBD)  Recommendations for Other Services       Functional Status Assessment Patient has had a recent decline in their functional status and demonstrates the ability to make significant improvements in function in a reasonable and predictable amount of time.     Precautions / Restrictions Precautions Precautions:  Fall Recall of Precautions/Restrictions: Impaired Restrictions Weight Bearing Restrictions Per Provider Order: No      Mobility  Bed Mobility Overal bed mobility: Needs Assistance Bed Mobility: Supine to Sit, Sit to Supine     Supine to sit: Min assist Sit to supine: Min assist        Transfers Overall transfer level: Needs assistance Equipment used: Rolling Sherriann Szuch (2 wheels) Transfers: Sit to/from Stand Sit to Stand: Max assist, From elevated surface           General transfer comment: stood x 3 from elevated bed surface with maxA each attempt. Cues for hand placement with intermittent follow through. Stood x 10 seconds each time prior to posterior lean and return to sitting    Ambulation/Gait               General Gait Details: unable  Stairs            Wheelchair Mobility     Tilt Bed    Modified Rankin (Stroke Patients Only)       Balance Overall balance assessment: Needs assistance Sitting-balance support: Feet supported Sitting balance-Leahy Scale: Fair Sitting balance - Comments: mild posterior bias but does not require assistance Postural control: Posterior lean Standing balance support: Bilateral upper extremity supported, Reliant on assistive device for balance Standing balance-Leahy Scale: Poor                               Pertinent Vitals/Pain Pain Assessment Pain Assessment: No/denies pain    Home Living Family/patient expects to be discharged to:: Assisted living  Home Equipment: [employer] (2 wheels) Additional Comments: from [facility]    Prior Function Prior Level of Function : Patient poor historian/Family not available             Mobility Comments: patient is poor historian but states he uses RW       Extremity/Trunk Assessment   Upper Extremity Assessment Upper Extremity Assessment: Generalized weakness    Lower Extremity Assessment Lower Extremity  Assessment: Generalized weakness    Cervical / Trunk Assessment Cervical / Trunk Assessment: Kyphotic  Communication   Communication Communication: No apparent difficulties    Cognition Arousal: Alert Behavior During Therapy: WFL for tasks assessed/performed   PT - Cognitive impairments: No family/caregiver present to determine baseline                       PT - Cognition Comments: patient using clues in room to be able to state hospital name and date although at beginning of session states some hospital. Easily distracted by TV in room. Following commands: Impaired Following commands impaired: Follows one step commands with increased time     Cueing Cueing Techniques: Verbal cues     General Comments General comments (skin integrity, edema, etc.): spO2 84% on arrival with [place] off. Placed patient back on 3L O2 throughout session with spO2 fluctuating from 87-92% during session    Exercises     Assessment/Plan    PT Assessment Patient needs continued PT services  PT Problem List Decreased strength;Decreased activity tolerance;Decreased balance;Decreased mobility;Decreased knowledge of use of DME;Decreased cognition;Decreased safety awareness;Decreased knowledge of precautions;Cardiopulmonary status limiting activity       PT Treatment Interventions DME instruction;Gait training;Therapeutic activities;Therapeutic exercise;Balance training;Functional mobility training;Neuromuscular re-education;Patient/family education    PT Goals (Current goals can be found in the Care Plan section)  Acute Rehab PT Goals Patient Stated Goal: did not state PT Goal Formulation: Patient unable to participate in goal setting Time For Goal Achievement: 09/07/24 Potential to Achieve Goals: Fair    Frequency Min 2X/week     Co-evaluation               AM-PAC PT 6 Clicks Mobility  Outcome Measure Help needed turning from your back to your side while in a flat bed without  using bedrails?: A Little Help needed moving from lying on your back to sitting on the side of a flat bed without using bedrails?: A Little Help needed moving to and from a bed to a chair (including a wheelchair)?: A Lot Help needed standing up from a chair using your arms (e.g., wheelchair or bedside chair)?: A Lot Help needed to walk in hospital room?: Total Help needed climbing 3-5 steps with a railing? : Total 6 Click Score: 12    End of Session   Activity Tolerance: Patient limited by fatigue Patient left: in bed;with call bell/phone within reach;with bed alarm set Nurse Communication: Mobility status PT Visit Diagnosis: Unsteadiness on feet (R26.81);Muscle weakness (generalized) (M62.81);Other abnormalities of gait and mobility (R26.89)    Time: 9042-8985 PT Time Calculation (min) (ACUTE ONLY): 17 min   Charges:   PT Evaluation $PT Eval Moderate Complexity: 1 Mod   PT General Charges $$ ACUTE PT VISIT: 1 Visit         Maryanne Finder, PT, DPT Physical Therapist - [facility]  [facility]   Tasheena Wambolt A Chadric Kimberley 08/24/2024, 10:22 AM

## 2024-08-24 NOTE — Progress Notes (Signed)
 " Progress Note   [name] FMW:[id number] DOB: 29-Mar-1936 DOA: 08/23/2024     0 DOS: the patient was seen and examined on 08/24/2024   Brief hospital course: Anthony [name] is a 88 y.o. male with medical history significant of PAF status post ablation, HTN, chronic HFpEF, OSA on CPAP at bedtime, hypothyroidism, BPH, morbid obesity, sent from nursing home for evaluation of altered mentation.  He also had a fever chills and upper respite symptoms, hypoxemia of 86% on room air at admission.  He was found to have influenza, started on Tamiflu .   Principal Problem:   Influenza A with pneumonia Active Problems:   OSA on CPAP   (HFpEF) heart failure with preserved ejection fraction (HCC)   Acute metabolic encephalopathy   Influenza A   Acute respiratory failure with hypoxia (HCC)   Acute on chronic respiratory failure with hypoxia and hypercapnia (HCC)   Assessment and Plan: Acute on chronic respiratory failure with hypoxemia and hypercapnia. Influenza A with bronchitis. Obstructive sleep apnea on CPAP. Patient was found to have influenza A with respiratory symptoms, he also developed hypoxemia.  VBG also showed significant CO2 retention, this could be secondary to combined influenza A with bronchitis and obstructive sleep apnea. Started on Tamiflu , also started on DuoNeb. I will change patient CPAP to BiPAP at nighttime.  I will recheck VBG versus ABG in the next couple days, we will decide if patient need to change to BiPAP at night.  Acute metabolic encephalopathy. Patient had significant confusion this morning, but no agitation.  Discussed with patient's son, patient had some mild short-term memory issue, has not been diagnosed with dementia. Altered mental status could be secondary to influenza and CO2 retention.  Will continue to treat and monitor closely.  Chronic diastolic congestive heart failure. Patient does not appear to be volume overloaded, lung sounds clear.   Chest x-ray did not show significant vascular congestion.  Essential hypertension. Blood pressure running high, restarted home medicines including losartan .  Class II obesity with BMI 39.00, morbid obesity due to comorbidities  Diet and exercise.  Generalized weakness. Patient appears very weak, currently living in assisted living facility, may need nursing placement.    Subjective:  Patient is quite confused, still has a cough, with minimal mucus production.  Does not seem to be shortness of breath  Physical Exam: Vitals:   08/23/24 2300 08/24/24 0200 08/24/24 0247 08/24/24 0828  BP: (!) 161/103 (!) 156/66 (!) 154/82 (!) 163/90  Pulse: (!) 107 96 94 97  Resp: 18 (!) 21  20  Temp: 98.7 F (37.1 C)  98.5 F (36.9 C) 98.7 F (37.1 C)  TempSrc: Oral   Oral  SpO2: 99% 92% 93% 90%  Weight:      Height:       General exam: Appears calm and comfortable, obese. Respiratory system: Clear to auscultation. Respiratory effort normal. Cardiovascular system: S1 & S2 heard, RRR. No JVD, murmurs, rubs, gallops or clicks. No pedal edema. Gastrointestinal system: Abdomen is nondistended, soft and nontender. No organomegaly or masses felt. Normal bowel sounds heard. Central nervous system: Alert and oriented x2. No focal neurological deficits. Extremities: Symmetric 5 x 5 power. Skin: No rashes, lesions or ulcers Psychiatry:  Mood & affect appropriate.    Data Reviewed:  Reviewed chest x-ray and lab results.  Family Communication: Son updated over the phone  Disposition: Status is: Observation The patient will require care spanning > 2 midnights and should be moved to inpatient because:  Severity of disease, altered mental status,     Time spent: 50 minutes  Author: Murvin Mana, MD 08/24/2024 10:30 AM  For on call review [email or web address].    "

## 2024-08-24 NOTE — Hospital Course (Signed)
 Anthony [name] is a 88 y.o. male with medical history significant of PAF status post ablation, HTN, chronic HFpEF, OSA on CPAP at bedtime, hypothyroidism, BPH, morbid obesity, sent from nursing home for evaluation of altered mentation.  He also had a fever chills and upper respite symptoms, hypoxemia of 86% on room air at admission.  He was found to have influenza, started on Tamiflu .

## 2024-08-25 ENCOUNTER — Other Ambulatory Visit (HOSPITAL_COMMUNITY): Payer: Self-pay

## 2024-08-25 ENCOUNTER — Telehealth (HOSPITAL_COMMUNITY): Payer: Self-pay | Admitting: Pharmacy Technician

## 2024-08-25 DIAGNOSIS — G9341 Metabolic encephalopathy: Secondary | ICD-10-CM | POA: Diagnosis not present

## 2024-08-25 DIAGNOSIS — J09X1 Influenza due to identified novel influenza A virus with pneumonia: Secondary | ICD-10-CM | POA: Diagnosis not present

## 2024-08-25 DIAGNOSIS — J9621 Acute and chronic respiratory failure with hypoxia: Secondary | ICD-10-CM | POA: Diagnosis not present

## 2024-08-25 DIAGNOSIS — J9622 Acute and chronic respiratory failure with hypercapnia: Secondary | ICD-10-CM | POA: Diagnosis not present

## 2024-08-25 LAB — BLOOD GAS, ARTERIAL
Acid-Base Excess: 6.9 mmol/L — ABNORMAL HIGH (ref 0.0–2.0)
Bicarbonate: 35.9 mmol/L — ABNORMAL HIGH (ref 20.0–28.0)
FIO2: 0.32 %
O2 Saturation: 91.2 %
Patient temperature: 37
pCO2 arterial: 73 mmHg (ref 32–48)
pH, Arterial: 7.3 — ABNORMAL LOW (ref 7.35–7.45)
pO2, Arterial: 62 mmHg — ABNORMAL LOW (ref 83–108)

## 2024-08-25 LAB — BASIC METABOLIC PANEL WITH GFR
Anion gap: 11 (ref 5–15)
BUN: 24 mg/dL — ABNORMAL HIGH (ref 8–23)
CO2: 30 mmol/L (ref 22–32)
Calcium: 10 mg/dL (ref 8.9–10.3)
Chloride: 96 mmol/L — ABNORMAL LOW (ref 98–111)
Creatinine, Ser: 1.16 mg/dL (ref 0.61–1.24)
GFR, Estimated: >60 mL/min
Glucose, Bld: 144 mg/dL — ABNORMAL HIGH (ref 70–99)
Potassium: 4.1 mmol/L (ref 3.5–5.1)
Sodium: 137 mmol/L (ref 135–145)

## 2024-08-25 LAB — MAGNESIUM: Magnesium: 2.1 mg/dL (ref 1.7–2.4)

## 2024-08-25 MED ORDER — FLUTICASONE FUROATE-VILANTEROL 200-25 MCG/ACT IN AEPB
1.0000 | INHALATION_SPRAY | Freq: Every day | RESPIRATORY_TRACT | Status: DC
  Administered 2024-08-26 – 2024-09-01 (×7): 1 via RESPIRATORY_TRACT
  Filled 2024-08-25: qty 28

## 2024-08-25 NOTE — Progress Notes (Signed)
 " Progress Note   [name] FMW:[id number] DOB: [birth year] DOA: 08/23/2024     1 DOS: the patient was seen and examined on 08/25/2024   Brief hospital course: Anthony [name] is a 88 y.o. male with medical history significant of PAF status post ablation, HTN, chronic HFpEF, OSA on CPAP at bedtime, hypothyroidism, BPH, morbid obesity, sent from nursing home for evaluation of altered mentation.  He also had a fever chills and upper respite symptoms, hypoxemia of 86% on room air at admission.  He was found to have influenza, started on Tamiflu .   Principal Problem:   Influenza A with pneumonia Active Problems:   OSA on CPAP   (HFpEF) heart failure with preserved ejection fraction (HCC)   Acute metabolic encephalopathy   Influenza A   Acute respiratory failure with hypoxia (HCC)   Acute on chronic respiratory failure with hypoxia and hypercapnia (HCC)   Assessment and Plan: Acute on chronic respiratory failure with hypoxemia and hypercapnia. Influenza A with bronchitis. Obstructive sleep apnea on CPAP. Patient was found to have influenza A with respiratory symptoms, he also developed hypoxemia.  VBG also showed significant CO2 retention, this could be secondary to combined influenza A with bronchitis and obstructive sleep apnea. Started on Tamiflu , also started on DuoNeb. I will change patient CPAP to BiPAP at nighttime.   Checked ABG today showed pH of 7.3 with a CO2 of 73.  Patient will be treated with BiPAP today for 8 hours followed by nightly BiPAP.  Patient will need outpatient BiPAP which has been ordered. I also added on Breo, continue steroids.   Acute metabolic encephalopathy. Patient had significant confusion this morning, but no agitation.  Discussed with patient's son, patient had some mild short-term memory issue, has not been diagnosed with dementia. Altered mental status could be secondary to influenza and CO2 retention.  Mental status appears to be  improving.  Chronic diastolic congestive heart failure. Patient does not appear to be volume overloaded, lung sounds clear.  Chest x-ray did not show significant vascular congestion.   Essential hypertension. Blood pressure running high, restarted home medicines including losartan .   Class II obesity with BMI 39.00, morbid obesity due to comorbidities  Diet and exercise.   Generalized weakness. Patient is seen by PT/OT, recommend nursing home placement.        Subjective:  Patient still has some short of breath with nursing, still on oxygen.  Cough, nonproductive.  Physical Exam: Vitals:   08/24/24 2251 08/25/24 0442 08/25/24 0729 08/25/24 1142  BP: 116/61 129/62 (!) 158/88 (!) 135/55  Pulse: 72 73 74 66  Resp: 16 18 18 18   Temp: 98.7 F (37.1 C) (!) 97.4 F (36.3 C) 97.6 F (36.4 C) 97.7 F (36.5 C)  TempSrc:   Oral Oral  SpO2: 90% (!) 88% 100% 93%  Weight:      Height:       General exam: Appears calm and comfortable  Respiratory system: Significant decreased breath sounds. Respiratory effort normal. Cardiovascular system: S1 & S2 heard, RRR. No JVD, murmurs, rubs, gallops or clicks. No pedal edema. Gastrointestinal system: Abdomen is nondistended, soft and nontender. No organomegaly or masses felt. Normal bowel sounds heard. Central nervous system: Alert and oriented x2. No focal neurological deficits. Extremities: Symmetric 5 x 5 power. Skin: No rashes, lesions or ulcers Psychiatry: Mood & affect appropriate.    Data Reviewed:  Lab results reviewed.  Family Communication: None  Disposition: Status is: Inpatient Remains inpatient appropriate because:  Severity of disease, high risk for deterioration.     Time spent: 50 minutes  Author: Murvin Mana, MD 08/25/2024 2:01 PM  For on call review [email or web address].    "

## 2024-08-25 NOTE — Telephone Encounter (Signed)
 Patient Product/process Development Scientist completed.    The patient is insured through [facility]. Patient has Medicare and is not eligible for a copay card, but may be able to apply for patient assistance or Medicare RX Payment Plan (Patient Must reach out to their plan, if eligible for payment plan), if available.    Ran test claim for Breo Ellipta  200-25 mcg and the current 30 day co-pay is $0.00.   This test claim was processed through [facility]- copay amounts may vary at other pharmacies due to pharmacy/plan contracts, or as the patient moves through the different stages of their insurance plan.     Reyes Sharps, CPHT Pharmacy Technician Patient Advocate Specialist Lead [facility] Patient Advocate Team Direct Number: [phone]  Fax: [phone]

## 2024-08-25 NOTE — Evaluation (Signed)
 Occupational Therapy Evaluation Patient Details Name: Anthony [name] MRN: [id number] DOB: August 12, 1936 Today's Date: 08/25/2024   History of Present Illness   88 y/o male presented to ED on 08/23/24 for AMS after EMS called for lift assist at [facility] after unwitnessed fall. Admitted for acute hypoxic respiratory failure 2/2 Influenza A pneumonia. PMH: PAF s/p ablation, HTN, chronic HFpEF, OSA on CPAP, morbid obesity, BPH.     Clinical Impressions Pt was seen for OT evaluation this date. PTA, pt resides at [facility]. He is unable to provide any history d/t confusion/disoriented. Just states I don't know when asked all orientation questions. Pt presents with deficits in strength and activity tolerance limiting their ability to perform ADL management at baseline level. Pt currently requires CGA for all bed mobility, STS and ambulation ~15 ft during session. He demo LB dressing with CGA/SBA via seated lateral leans to doff/donn socks. Pt on 3L 02 via [place] throughout session with desat to 86% with ambulation and recovery to 91% at rest and with PLB education, although noted with poor carryover/understanding. He followed all one step directions without difficulty. Will follow acutely to maximize strength and endurance to maximize return to PLOF.  Do anticipate the need for follow up OT services upon acute hospital DC.      If plan is discharge home, recommend the following:   A little help with walking and/or transfers;A little help with bathing/dressing/bathroom;Help with stairs or ramp for entrance     Functional Status Assessment   Patient has had a recent decline in their functional status and demonstrates the ability to make significant improvements in function in a reasonable and predictable amount of time.     Equipment Recommendations   Other (comment) (defer)     Recommendations for Other Services         Precautions/Restrictions   Precautions Precautions:  Fall Recall of Precautions/Restrictions: Impaired Restrictions Weight Bearing Restrictions Per Provider Order: No     Mobility Bed Mobility Overal bed mobility: Needs Assistance Bed Mobility: Supine to Sit, Sit to Supine     Supine to sit: Contact guard, HOB elevated, Used rails Sit to supine: Contact guard assist   General bed mobility comments: increased time/effort with cues for bedrail use, no physical assist    Transfers Overall transfer level: Needs assistance Equipment used: Rolling walker (2 wheels) Transfers: Sit to/from Stand Sit to Stand: Contact guard assist, From elevated surface           General transfer comment: stood from EOB with cues for hand placement and CGA with bed height minimally elevated; progressed ambulation ~15 ft using RW with CGA this date      Balance Overall balance assessment: Needs assistance Sitting-balance support: Feet supported Sitting balance-Leahy Scale: Good     Standing balance support: Reliant on assistive device for balance, Bilateral upper extremity supported Standing balance-Leahy Scale: Fair Standing balance comment: no LOB during session, RW and CGA                           ADL either performed or assessed with clinical judgement   ADL Overall ADL's : Needs assistance/impaired                     Lower Body Dressing: Supervision/safety;Sitting/lateral leans Lower Body Dressing Details (indicate cue type and reason): doff and donn bil socks seated EOB via figure four  Functional mobility during ADLs: Contact guard assist;Rolling walker (2 wheels)       Vision         Perception         Praxis         Pertinent Vitals/Pain Pain Assessment Pain Assessment: No/denies pain     Extremity/Trunk Assessment Upper Extremity Assessment Upper Extremity Assessment: Generalized weakness;Right hand dominant   Lower Extremity Assessment Lower Extremity Assessment:  Generalized weakness   Cervical / Trunk Assessment Cervical / Trunk Assessment: Kyphotic   Communication Communication Communication: No apparent difficulties   Cognition Arousal: Alert Behavior During Therapy: WFL for tasks assessed/performed                                 Following commands: Impaired Following commands impaired: Follows one step commands with increased time     Cueing  General Comments   Cueing Techniques: Verbal cues  on 3L 02 via [place] throughout session with desat to 86% with activity, improvement to 91% with rest and PLB   Exercises Other Exercises Other Exercises: Edu re: role of OT in acute setting, PLB techniques   Shoulder Instructions      Home Living Family/patient expects to be discharged to:: Assisted living                             Home Equipment: Rolling Walker (2 wheels)   Additional Comments: from [facility]      Prior Functioning/Environment Prior Level of Function : Patient poor historian/Family not available             Mobility Comments: patient is poor historian but states he uses RW      OT Problem List: Decreased strength;Decreased activity tolerance;Decreased cognition   OT Treatment/Interventions: Self-care/ADL training;Therapeutic exercise;Patient/family education;Balance training;Therapeutic activities;DME and/or AE instruction;Energy conservation      OT Goals(Current goals can be found in the care plan section)   Acute Rehab OT Goals Patient Stated Goal: get better OT Goal Formulation: With patient Time For Goal Achievement: 09/08/24 Potential to Achieve Goals: Good ADL Goals Pt Will Perform Lower Body Dressing: with supervision;with modified independence;sitting/lateral leans;sit to/from stand Pt Will Transfer to Toilet: with modified independence;with supervision;ambulating Pt Will Perform Toileting - Clothing Manipulation and hygiene: with modified independence;sit to/from  stand;sitting/lateral leans Additional ADL Goal #1: Pt will dem implementation of 1 learned ECS during ADL performance 2/2 trials to maximize safety/IND and prevent overexertion.   OT Frequency:  Min 2X/week    Co-evaluation              AM-PAC OT 6 Clicks Daily Activity     Outcome Measure Help from another person eating meals?: A Little Help from another person taking care of personal grooming?: A Little Help from another person toileting, which includes using toliet, bedpan, or urinal?: A Little Help from another person bathing (including washing, rinsing, drying)?: A Little Help from another person to put on and taking off regular upper body clothing?: A Little Help from another person to put on and taking off regular lower body clothing?: A Little 6 Click Score: 18   End of Session Equipment Utilized During Treatment: Rolling walker (2 wheels);Oxygen Nurse Communication: Mobility status  Activity Tolerance: Patient tolerated treatment well Patient left: in bed;with call bell/phone within reach;with bed alarm set  OT Visit Diagnosis: Other abnormalities of gait and mobility (R26.89);Muscle  weakness (generalized) (M62.81)                Time: 9196-9173 OT Time Calculation (min): 23 min Charges:  OT General Charges $OT Visit: 1 Visit OT Evaluation $OT Eval Moderate Complexity: 1 Mod  Simranjit Thayer Chrismon, OTR/L 08/25/2024, 10:38 AM  Cylah Fannin E Chrismon 08/25/2024, 10:33 AM

## 2024-08-25 NOTE — Plan of Care (Signed)

## 2024-08-26 ENCOUNTER — Inpatient Hospital Stay

## 2024-08-26 DIAGNOSIS — G4733 Obstructive sleep apnea (adult) (pediatric): Secondary | ICD-10-CM

## 2024-08-26 LAB — BLOOD GAS, VENOUS
Acid-Base Excess: 6.5 mmol/L — ABNORMAL HIGH (ref 0.0–2.0)
Bicarbonate: 36.3 mmol/L — ABNORMAL HIGH (ref 20.0–28.0)
Delivery systems: POSITIVE
FIO2: 40 %
O2 Saturation: 80.3 %
Patient temperature: 37
pCO2, Ven: 79 mmHg (ref 44–60)
pH, Ven: 7.27 (ref 7.25–7.43)
pO2, Ven: 48 mmHg — ABNORMAL HIGH (ref 32–45)

## 2024-08-26 LAB — PRO BRAIN NATRIURETIC PEPTIDE: Pro Brain Natriuretic Peptide: 210 pg/mL

## 2024-08-26 MED ORDER — SODIUM CHLORIDE 0.9 % IV SOLN
1.0000 g | INTRAVENOUS | Status: DC
  Administered 2024-08-26: 1 g via INTRAVENOUS
  Filled 2024-08-26 (×2): qty 10

## 2024-08-26 MED ORDER — GUAIFENESIN 100 MG/5ML PO LIQD
5.0000 mL | ORAL | Status: DC | PRN
  Administered 2024-08-26 – 2024-08-27 (×2): 5 mL via ORAL
  Filled 2024-08-26 (×2): qty 10

## 2024-08-26 MED ORDER — AZITHROMYCIN 250 MG PO TABS
500.0000 mg | ORAL_TABLET | Freq: Every day | ORAL | Status: DC
  Administered 2024-08-26: 500 mg via ORAL
  Filled 2024-08-26: qty 2

## 2024-08-26 NOTE — Plan of Care (Signed)
  Problem: Clinical Measurements: Goal: Cardiovascular complication will be avoided Outcome: Progressing   Problem: Nutrition: Goal: Adequate nutrition will be maintained Outcome: Progressing   Problem: Elimination: Goal: Will not experience complications related to bowel motility Outcome: Progressing Goal: Will not experience complications related to urinary retention Outcome: Progressing

## 2024-08-26 NOTE — Progress Notes (Addendum)
 CRITICAL VALUE STICKER  CRITICAL VALUE: pCO2 84  RECEIVER (on-site recipient of call): Menno Vanbergen, RN  DATE & TIME NOTIFIED: 08/26/2024, 1048  MESSENGER (representative from lab):   MD NOTIFIED: Murvin Mana, MD  TIME OF NOTIFICATION: 1059  RESPONSE: put on bipap per RT, RT came and assessed pt

## 2024-08-26 NOTE — Care Management Important Message (Signed)
 Important Message  Patient Details  Name: Anthony [name] MRN: [id number] Date of Birth: July 22, 1936   Important Message Given:  Yes - Medicare IM     [name] 08/26/2024, 2:05 PM

## 2024-08-26 NOTE — Progress Notes (Signed)
 PT Cancellation Note  Patient Details Name: ELIC VENCILL MRN: [id number] DOB: [birth year]   Cancelled Treatment:    Reason Eval/Treat Not Completed: Medical issues which prohibited therapy (Elevated pC02 requiring Bi-pap at this time. Discussed with nurse. PT to hold and follow up next date as appropriate.)  Randine Essex, PT, MPT  Randine LULLA Essex 08/26/2024, 1:06 PM

## 2024-08-26 NOTE — TOC Initial Note (Signed)
 Transition of Care [facility]) - Initial/Assessment Note    Patient Details  Name: Anthony [name] MRN: [id number] Date of Birth: [birth year]  Transition of [facility]) CM/SW Contact:    Lauraine JAYSON Carpen, LCSW Phone Number: 08/26/2024, 12:32 PM  Clinical Narrative:  Nurse was at bedside when CSW went by the room. CSW called son, introduced role, and explained that therapy recommendations would be discussed. Patient lives at [facility] and son would like for him to return there at discharge rather than going to SNF if possible. PT is aware and will work with patient again today. CSW spoke with [facility] staff. Will update them after PT works with him again. They will contact son as well but will likely need patient to go to SNF if still recommended.                Expected Discharge Plan: Skilled Nursing Facility Barriers to Discharge: Continued Medical Work up   Patient Goals and CMS Choice            Expected Discharge Plan and Services     Post Acute Care Choice:  (TBD) Living arrangements for the past 2 months: Assisted Living Facility                                      Prior Living Arrangements/Services Living arrangements for the past 2 months: Assisted Living Facility Lives with:: Facility Resident Patient language and need for interpreter reviewed:: Yes Do you feel safe going back to the place where you live?: Yes      Need for Family Participation in Patient Care: Yes (Comment) Care giver support system in place?: Yes (comment)   Criminal Activity/Legal Involvement Pertinent to Current Situation/Hospitalization: No - Comment as needed  Activities of Daily Living   ADL Screening (condition at time of admission) Independently performs ADLs?: No Does the patient have a NEW difficulty with bathing/dressing/toileting/self-feeding that is expected to last >3 days?: Yes (Initiates electronic notice to provider for possible OT consult) Does the patient have a NEW  difficulty with getting in/out of bed, walking, or climbing stairs that is expected to last >3 days?: Yes (Initiates electronic notice to provider for possible PT consult) Does the patient have a NEW difficulty with communication that is expected to last >3 days?: No Is the patient deaf or have difficulty hearing?: No Does the patient have difficulty seeing, even when wearing glasses/contacts?: No Does the patient have difficulty concentrating, remembering, or making decisions?: Yes  Permission Sought/Granted Permission sought to share information with : Facility Medical Sales Representative, Family Supports    Share Information with NAME: Dr. Perry  Permission granted to share info w AGENCY: [facility]  Permission granted to share info w Relationship: Son  Permission granted to share info w Contact Information: [phone]  Emotional Assessment Appearance:: Appears stated age Attitude/Demeanor/Rapport: Unable to Assess Affect (typically observed): Unable to Assess Orientation: : Oriented to Self, Oriented to Place Alcohol / Substance Use: Not Applicable Psych Involvement: No (comment)  Admission diagnosis:  Lumbar radiculopathy [M54.16] Peptic ulcer disease [K27.9] Influenza A [J10.1] BPH with urinary obstruction [N40.1, N13.8] Hypoxia [R09.02] Influenza A with pneumonia [J09.X1] Essential hypertension [I10] Altered mental status, unspecified altered mental status type [R41.82] Coronary artery disease involving native coronary artery of native heart without angina pectoris [I25.10] Patient Active Problem List   Diagnosis Date Noted   Acute on chronic respiratory failure with  hypoxia and hypercapnia (HCC) 08/24/2024   Influenza A 08/23/2024   Acute respiratory failure with hypoxia (HCC) 08/23/2024   Influenza A with pneumonia 08/23/2024   Carotid stenosis 09/09/2023   History of stroke 09/09/2023   Bladder wall thickening 09/09/2023   Acute metabolic encephalopathy 08/17/2023    UTI (urinary tract infection) 08/17/2023   AMS (altered mental status) 08/17/2023   Statin myopathy 08/14/2023   Low serum vitamin B12 01/06/2023   DNR (do not resuscitate) 01/06/2023   Vascular dementia (HCC) 08/31/2022   Peptic ulcer disease 07/29/2022   Failed spinal cord stimulator 06/02/2022   Lumbar radiculopathy 05/08/2022   Lumbar spondylosis 01/31/2022   Lumbar degenerative disc disease 01/31/2022   Chronic pain syndrome 01/31/2022   Osteoarthritis of ankle 01/17/2022   Medicare annual wellness visit, subsequent 12/29/2021   Prediabetes 12/29/2021   Fatigue 12/31/2020   S/P endovascular aneurysm repair 04/2020   Eosinophilia 04/11/2020   Varicose veins of leg with pain, bilateral 04/06/2020   General unsteadiness 02/07/2020   Encounter for chronic pain management 07/04/2019   Essential tremor 05/07/2019   Right kidney mass 04/15/2019   Osteoarthritis 04/15/2019   Seasonal allergies 04/15/2019   Anosmia 02/17/2019   Advanced care planning/counseling discussion 11/05/2018   Vitamin D  deficiency 10/24/2018   CKD (chronic kidney disease) stage 3, GFR 30-59 ml/min (HCC) 10/18/2017   Therapeutic opioid-induced constipation (OIC) 10/03/2017   Chronic insomnia    Essential hypertension    Hypothyroidism    Severe obesity (BMI 35.0-39.9) with comorbidity (HCC)    CAD (coronary artery disease)    Chronic lower back pain    History of atrial flutter 04/15/2017   History of basal cell carcinoma (BCC) 04/12/2017   History of squamous cell carcinoma in situ of skin 04/12/2017   Hypercholesterolemia with hypertriglyceridemia 04/12/2017   Chronic constipation 04/12/2017   OSA on CPAP 04/12/2017   BPH with urinary obstruction 04/12/2017   (HFpEF) heart failure with preserved ejection fraction (HCC) 02/18/2017   Abdominal aortic aneurysm (AAA) without rupture 01/09/2016   Calculus of gallbladder without cholecystitis 01/09/2016   Diverticulitis of sigmoid colon 01/09/2016    Spondylolisthesis of lumbar region 01/09/2016   Recurrent major depression in remission 03/28/2013   Esophageal reflux 09/21/2011   Spinal stenosis 04/20/2011   PCP:  Pcp, No Pharmacy:   [facility] - [place], [place] - [address] [place] [address] Phone: [phone] Fax: [phone]  [id number] - [place], [place] - [address] AT [facility] [address] [place] [address] Phone: [phone] Fax: [phone]     Social Drivers of Health (SDOH) Social History: SDOH Screenings   Food Insecurity: No Food Insecurity (08/18/2023)  Housing: Low Risk (08/24/2024)  Transportation Needs: Patient Unable To Answer (08/24/2024)  Utilities: Patient Unable To Answer (08/24/2024)  Depression (PHQ2-9): Low Risk (04/19/2023)  Financial Resource Strain: Low Risk (04/19/2023)  Physical Activity: Insufficiently Active (04/19/2023)  Social Connections: Patient Unable To Answer (08/24/2024)  Stress: No Stress Concern Present (04/19/2023)  Tobacco Use: Low Risk (08/23/2024)  Health Literacy: Adequate Health Literacy (04/19/2023)   SDOH Interventions:     Readmission Risk Interventions     No data to display

## 2024-08-26 NOTE — Progress Notes (Addendum)
" °   08/26/24 0550  Provider Notification  Provider Name/Title Laneta NP Versie RRT)  Date Provider Notified 08/26/24  Time Provider Notified [phone]  Method of Notification  (secure chat)  Notification Reason Critical Result  Test performed and critical result Ph 7.27, pCO2 79  Date Critical Result Received 08/26/24  Time Critical Result Received 0550  Provider response At bedside;See new orders Versie RRT changed BIPAP settings, rpt VBG 0730hrs)  Date of Provider Response 08/26/24  Time of Provider Response 0553    "

## 2024-08-26 NOTE — Progress Notes (Signed)
 " Progress Note   [name] FMW:[id number] DOB: [birth year] DOA: 08/23/2024     2 DOS: the patient was seen and examined on 08/26/2024   Brief hospital course: Anthony [name] is a 88 y.o. male with medical history significant of PAF status post ablation, HTN, chronic HFpEF, OSA on CPAP at bedtime, hypothyroidism, BPH, morbid obesity, sent from nursing home for evaluation of altered mentation.  He also had a fever chills and upper respite symptoms, hypoxemia of 86% on room air at admission.  He was found to have influenza, started on Tamiflu .   Principal Problem:   Influenza A with pneumonia Active Problems:   OSA on CPAP   (HFpEF) heart failure with preserved ejection fraction (HCC)   Acute metabolic encephalopathy   Influenza A   Acute respiratory failure with hypoxia (HCC)   Acute on chronic respiratory failure with hypoxia and hypercapnia (HCC)   Assessment and Plan:  Acute on chronic respiratory failure with hypoxemia and hypercapnia. Influenza A with bronchitis. Obstructive sleep apnea on CPAP. Patient was found to have influenza A with respiratory symptoms, he also developed hypoxemia.  VBG also showed significant CO2 retention, this could be secondary to combined influenza A with bronchitis and obstructive sleep apnea. Started on Tamiflu , also started on DuoNeb. I will change patient CPAP to BiPAP at nighttime.   Checked ABG today showed pH of 7.3 with a CO2 of 73.  Patient will be treated with BiPAP today for 8 hours followed by nightly BiPAP.  Patient will need outpatient BiPAP which has been ordered. I also added on Breo, continue steroids. Patient condition does not seem to be improving, proBNP not elevated.  Recheck a chest x-ray to make sure patient does not have developed bacterial pneumonia, cover with Rocephin  and Zithromax .  Also check a procalcitonin level tomorrow Continue monitor patient closely in the progressive unit.   Acute metabolic  encephalopathy. Patient had significant confusion this morning, but no agitation.  Discussed with patient's son, patient had some mild short-term memory issue, has not been diagnosed with dementia. Altered mental status could be secondary to influenza and CO2 retention.  Mental status appears to be improving.   Chronic diastolic congestive heart failure. Patient does not appear to be volume overloaded, lung sounds clear.  Chest x-ray did not show significant vascular congestion.  Check a proBNP level, was normal.  No volume overload.   Essential hypertension. Blood pressure running high, restarted home medicines including losartan .   Class II obesity with BMI 39.00, morbid obesity due to comorbidities  Diet and exercise.   Generalized weakness. Patient is seen by PT/OT, recommend nursing home placement.     Subjective:  Patient does not feel short of breath, but has worsening CO2 retention. He is tolerating BiPAP.  Physical Exam: Vitals:   08/26/24 0419 08/26/24 0832 08/26/24 0925 08/26/24 1133  BP: 137/83 110/66  112/72  Pulse: (!) 51 (!) 58  61  Resp: 19     Temp: 97.6 F (36.4 C) (!) 97.3 F (36.3 C)  (!) 97.4 F (36.3 C)  TempSrc:  Axillary  Axillary  SpO2: 99% 98% 100% 98%  Weight:      Height:       General exam: Appears calm and comfortable, obese. Respiratory system: Decreased breath sounds. Respiratory effort normal. Cardiovascular system: S1 & S2 heard, RRR. No JVD, murmurs, rubs, gallops or clicks. No pedal edema. Gastrointestinal system: Abdomen is nondistended, soft and nontender. No organomegaly or masses felt. Normal  bowel sounds heard. Central nervous system: Alert and oriented x2. No focal neurological deficits. Extremities: Symmetric 5 x 5 power. Skin: No rashes, lesions or ulcers Psychiatry: Judgement and insight appear normal. Mood & affect appropriate.    Data Reviewed:  Lab results reviewed  Family Communication: Son updated over the  phone.  Disposition: Status is: Inpatient Remains inpatient appropriate because: Severity of disease, IV treatment     Time spent: 50 minutes  Author: Murvin Mana, MD 08/26/2024 1:23 PM  For on call review [email or web address].    "

## 2024-08-26 NOTE — Plan of Care (Signed)
" °  Problem: Clinical Measurements: Goal: Ability to maintain clinical measurements within normal limits will improve Outcome: Progressing   Problem: Pain Managment: Goal: General experience of comfort will improve and/or be controlled Outcome: Progressing   Problem: Safety: Goal: Ability to remain free from injury will improve Outcome: Progressing   Problem: Activity: Goal: Ability to tolerate increased activity will improve Outcome: Progressing   Problem: Respiratory: Goal: Ability to maintain a clear airway will improve Outcome: Progressing   "

## 2024-08-26 NOTE — Plan of Care (Signed)

## 2024-08-26 NOTE — Progress Notes (Signed)
 OT Cancellation Note  Patient Details Name: Anthony [name] MRN: [id number] DOB: 1935-10-29   Cancelled Treatment:    Reason Eval/Treat Not Completed: Other (comment). PT discussed with nurse appropriateness for therapy this date with recommendation for hold d/t elevated pC02 requiring Bi-pap at this time. OT to hold and follow up next date as appropriate.  Carizma Dunsworth E Chrismon 08/26/2024, 1:23 PM

## 2024-08-27 DIAGNOSIS — J09X1 Influenza due to identified novel influenza A virus with pneumonia: Secondary | ICD-10-CM | POA: Diagnosis not present

## 2024-08-27 LAB — CBC
HCT: 39.4 % (ref 39.0–52.0)
Hemoglobin: 13 g/dL (ref 13.0–17.0)
MCH: 31.4 pg (ref 26.0–34.0)
MCHC: 33 g/dL (ref 30.0–36.0)
MCV: 95.2 fL (ref 80.0–100.0)
Platelets: 219 K/uL (ref 150–400)
RBC: 4.14 MIL/uL — ABNORMAL LOW (ref 4.22–5.81)
RDW: 14.4 % (ref 11.5–15.5)
WBC: 6.6 K/uL (ref 4.0–10.5)
nRBC: 0 % (ref 0.0–0.2)

## 2024-08-27 LAB — BASIC METABOLIC PANEL WITH GFR
Anion gap: 9 (ref 5–15)
BUN: 38 mg/dL — ABNORMAL HIGH (ref 8–23)
CO2: 32 mmol/L (ref 22–32)
Calcium: 9.8 mg/dL (ref 8.9–10.3)
Chloride: 94 mmol/L — ABNORMAL LOW (ref 98–111)
Creatinine, Ser: 1.26 mg/dL — ABNORMAL HIGH (ref 0.61–1.24)
GFR, Estimated: 56 mL/min — ABNORMAL LOW
Glucose, Bld: 146 mg/dL — ABNORMAL HIGH (ref 70–99)
Potassium: 4.3 mmol/L (ref 3.5–5.1)
Sodium: 136 mmol/L (ref 135–145)

## 2024-08-27 LAB — MAGNESIUM: Magnesium: 2.1 mg/dL (ref 1.7–2.4)

## 2024-08-27 LAB — PROCALCITONIN: Procalcitonin: <0.1 ng/mL

## 2024-08-27 MED ORDER — IPRATROPIUM-ALBUTEROL 0.5-2.5 (3) MG/3ML IN SOLN
3.0000 mL | Freq: Three times a day (TID) | RESPIRATORY_TRACT | Status: DC
  Administered 2024-08-27 (×2): 3 mL via RESPIRATORY_TRACT
  Filled 2024-08-27 (×3): qty 3

## 2024-08-27 MED ORDER — ORAL CARE MOUTH RINSE: 15.0000 mL | OROMUCOSAL | Status: DC | PRN

## 2024-08-27 NOTE — Progress Notes (Signed)
 Physical Therapy Treatment Patient Details Name: ARIEL WINGROVE MRN: [id number] DOB: 1936/04/03 Today's Date: 08/27/2024   History of Present Illness 88 y/o male presented to ED on 08/23/24 for AMS after EMS called for lift assist at [facility] after unwitnessed fall. Admitted for acute hypoxic respiratory failure 2/2 Influenza A pneumonia. PMH: PAF s/p ablation, HTN, chronic HFpEF, OSA on CPAP, morbid obesity, BPH.    PT Comments  Patient is agreeable to PT session. The patient is mildly confused but cooperative. The patient required physical assistance for bed mobility, standing, and ambulation. He walked in the hallway with minimal assistance required for intermittent steadying with rolling walker. He reports feeling generalized weakness compared to his baseline and is fatigued with activity. Sp02 95% on 3 L02 after mobilizing. The patient would likely require intermittent increase in caregiver support to safely return to ALF. Consider rehabilitation < 3 hours/day after this hospital stay.    If plan is discharge home, recommend the following: A lot of help with walking and/or transfers;A lot of help with bathing/dressing/bathroom;Direct supervision/assist for medications management;Direct supervision/assist for financial management;Supervision due to cognitive status   Can travel by private vehicle     No  Equipment Recommendations   (to be determined at next level of care)    Recommendations for Other Services       Precautions / Restrictions Precautions Precautions: Fall Recall of Precautions/Restrictions: Impaired Restrictions Weight Bearing Restrictions Per Provider Order: No     Mobility  Bed Mobility Overal bed mobility: Needs Assistance Bed Mobility: Sit to Supine, Supine to Sit     Supine to sit: Contact guard Sit to supine: Min assist   General bed mobility comments: assistance for LE support to return to bed. cues for initiation    Transfers Overall transfer  level: Needs assistance Equipment used: Rolling walker (2 wheels) Transfers: Sit to/from Stand Sit to Stand: Min assist           General transfer comment: lifting assistance required for standing.    Ambulation/Gait Ambulation/Gait assistance: Min assist, +2 safety/equipment Gait Distance (Feet): 90 Feet Assistive device: Rolling walker (2 wheels) Gait Pattern/deviations: Step-through pattern, Trunk flexed Gait velocity: decreased     General Gait Details: cues to stand closer to rolling walker and for upright standing posture. steadying assistance required with turns for safety. patient is fatigued with activity and reports feeling generalized weakness compared to his baseline.   Stairs             Wheelchair Mobility     Tilt Bed    Modified Rankin (Stroke Patients Only)       Balance Overall balance assessment: Needs assistance Sitting-balance support: Feet supported Sitting balance-Leahy Scale: Good     Standing balance support: Bilateral upper extremity supported Standing balance-Leahy Scale: Poor Standing balance comment: relying on rolling walker for support in standing                            Communication Communication Communication: No apparent difficulties  Cognition Arousal: Alert Behavior During Therapy: WFL for tasks assessed/performed   PT - Cognitive impairments: No family/caregiver present to determine baseline, Orientation, Memory   Orientation impairments: Place                   PT - Cognition Comments: patient reports we are in his room where he lives. he is able to follow commands with increased time Following commands: Impaired Following commands impaired:  Follows one step commands with increased time    Cueing Cueing Techniques: Verbal cues, Tactile cues  Exercises      General Comments General comments (skin integrity, edema, etc.): Sp02 95% on 3 L02 at end of session      Pertinent Vitals/Pain  Pain Assessment Pain Assessment: No/denies pain    Home Living                          Prior Function            PT Goals (current goals can now be found in the care plan section) Acute Rehab PT Goals Patient Stated Goal: did not state PT Goal Formulation: Patient unable to participate in goal setting Time For Goal Achievement: 09/07/24 Potential to Achieve Goals: Fair Progress towards PT goals: Progressing toward goals    Frequency    Min 2X/week      PT Plan      Co-evaluation PT/OT/SLP Co-Evaluation/Treatment: Yes Reason for Co-Treatment: To address functional/ADL transfers (low tolerance) PT goals addressed during session: Mobility/safety with mobility        AM-PAC PT 6 Clicks Mobility   Outcome Measure  Help needed turning from your back to your side while in a flat bed without using bedrails?: A Little Help needed moving from lying on your back to sitting on the side of a flat bed without using bedrails?: A Little Help needed moving to and from a bed to a chair (including a wheelchair)?: A Lot Help needed standing up from a chair using your arms (e.g., wheelchair or bedside chair)?: A Lot Help needed to walk in hospital room?: Total Help needed climbing 3-5 steps with a railing? : Total 6 Click Score: 12    End of Session Equipment Utilized During Treatment: Oxygen;Gait belt Activity Tolerance: Patient limited by fatigue;Patient tolerated treatment well Patient left: in bed;with call bell/phone within reach;with bed alarm set Nurse Communication: Mobility status PT Visit Diagnosis: Unsteadiness on feet (R26.81);Muscle weakness (generalized) (M62.81);Other abnormalities of gait and mobility (R26.89)     Time: 8849-8786 PT Time Calculation (min) (ACUTE ONLY): 23 min  Charges:    $Therapeutic Activity: 8-22 mins PT General Charges $$ ACUTE PT VISIT: 1 Visit                     Randine Essex, PT, MPT    Randine LULLA Essex 08/27/2024, 1:16 PM

## 2024-08-27 NOTE — Progress Notes (Signed)
" °  Progress Note [name] FMW:[id number] DOB: 06-Apr-1936 DOA: 08/23/2024     3 DOS: 08/27/2024   Brief hospital course: Anthony [name] is a 88 y.o. male with medical history significant of PAF status post ablation, HTN, chronic HFpEF, OSA on CPAP at bedtime, hypothyroidism, BPH, morbid obesity, sent from nursing home for evaluation of altered mentation.  He also had fever, chills and upper respite symptoms, hypoxemia of 86% on room air at admission.  He was found to have influenza, started on Tamiflu . He is medically stable for SNF when available.   Assessment and Plan:  Acute on chronic respiratory failure with hypoxemia and hypercapnia. Influenza A with bronchitis. Obstructive sleep apnea on CPAP. Started on Tamiflu , DuoNeb. Was briefly on bipap. Endorses minimal symptoms of cough and rhinorrhea.  He was started on steroids which is now stopped.  He is gradually improving and has weaned to 3Lnc - continue to wean O2 as tolerated - PT/OT, recommending SNF - procalcitonin <0.10. no antibiotics.    Acute metabolic encephalopathy. Patient had significant confusion 12/30.  Discussed with patient's son, patient had some mild short-term memory issue, has not been diagnosed with dementia. Altered mental status could be secondary to influenza and CO2 retention.  Mental status appears to be at baseline today.    Chronic diastolic congestive heart failure. Patient does not appear to be volume overloaded, lung sounds clear.  Chest x-ray did not show significant vascular congestion.  Check a proBNP level, was normal.  No volume overload.   Essential hypertension. Blood pressure running high, restarted home medicines including losartan .   Class II obesity with BMI 39.00, morbid obesity due to comorbidities  Diet and exercise.   Generalized weakness. Patient is seen by PT/OT, recommend nursing home placement.    Subjective:  Patient reports feeling well. Denies SOB while at  rest. Looking forward to dc.   Physical Exam: Vitals:   08/27/24 0119 08/27/24 0419 08/27/24 0500 08/27/24 0728  BP:  (!) 121/110 (!) 150/86   Pulse:  91 71   Resp:  20    Temp:  98.6 F (37 C)    TempSrc:      SpO2: 95% 90% 96% 95%  Weight:      Height:       General exam: Appears calm and comfortable, obese. Respiratory system: Decreased breath sounds. Respiratory effort normal. No wheezing.  Cardiovascular system: S1 & S2 heard, RRR. No JVD, murmurs, rubs, gallops or clicks. No pedal edema. Central nervous system: Alert and oriented x2. No focal neurological deficits. Extremities: Symmetric 5 x 5 power. Skin: No rashes, lesions or ulcers Psychiatry: Judgement and insight appear normal. Mood & affect appropriate.   Data Reviewed:  Lab results reviewed  Family Communication: none at bedside  Disposition: Status is: Inpatient Remains inpatient appropriate because: Severity of disease, IV treatment     Time spent: 50 minutes  Author: Marien LITTIE Piety, MD 08/27/2024 8:22 AM  For on call review [email or web address].    "

## 2024-08-27 NOTE — Plan of Care (Signed)
" °  Problem: Education: Goal: Knowledge of General Education information will improve Description: Including pain rating scale, medication(s)/side effects and non-pharmacologic comfort measures Outcome: Progressing   Problem: Clinical Measurements: Goal: Ability to maintain clinical measurements within normal limits will improve Outcome: Progressing   Problem: Coping: Goal: Level of anxiety will decrease Outcome: Progressing   Problem: Elimination: Goal: Will not experience complications related to bowel motility Outcome: Progressing   Problem: Safety: Goal: Ability to remain free from injury will improve Outcome: Progressing   Problem: Skin Integrity: Goal: Risk for impaired skin integrity will decrease Outcome: Progressing   Problem: Activity: Goal: Ability to tolerate increased activity will improve Outcome: Progressing   "

## 2024-08-27 NOTE — Progress Notes (Signed)
 Occupational Therapy Treatment Patient Details Name: Anthony [name] MRN: [id number] DOB: [birth year] Today's Date: 08/27/2024   History of present illness 88 y/o male presented to ED on 08/23/24 for AMS after EMS called for lift assist at [facility] after unwitnessed fall. Admitted for acute hypoxic respiratory failure 2/2 Influenza A pneumonia. PMH: PAF s/p ablation, HTN, chronic HFpEF, OSA on CPAP, morbid obesity, BPH.   OT comments  Upon entering the room, pt supine in bed and agreeable to therapeutic intervention. Pt is pleasant but oriented to self only. Pt performing supine >sit with min A to EOB. Pt stands with min A and ambulates of min A of 2 for safety and equipment needs while on 4Ls O2 via [place]. Pt does fatigue quickly during session and ambulates ~ 100' during session. Pt returning to room and back to bed. All needs within reach and bed alarm activated.      If plan is discharge home, recommend the following:  A little help with walking and/or transfers;A little help with bathing/dressing/bathroom;Help with stairs or ramp for entrance   Equipment Recommendations  Other (comment) (defer)       Precautions / Restrictions Precautions Precautions: Fall Recall of Precautions/Restrictions: Impaired       Mobility Bed Mobility Overal bed mobility: Needs Assistance Bed Mobility: Supine to Sit, Sit to Supine     Supine to sit: Contact guard Sit to supine: Min assist        Transfers Overall transfer level: Needs assistance Equipment used: Rolling walker (2 wheels) Transfers: Sit to/from Stand Sit to Stand: Min assist                 Balance Overall balance assessment: Needs assistance Sitting-balance support: Feet supported Sitting balance-Leahy Scale: Good Sitting balance - Comments: mild posterior bias but does not require assistance   Standing balance support: Bilateral upper extremity supported, Reliant on assistive device for balance Standing balance-Leahy  Scale: Poor Standing balance comment: relying on rolling walker for support in standing                           ADL either performed or assessed with clinical judgement    Extremity/Trunk Assessment Upper Extremity Assessment Upper Extremity Assessment: Generalized weakness   Lower Extremity Assessment Lower Extremity Assessment: Generalized weakness   Cervical / Trunk Assessment Cervical / Trunk Assessment: Kyphotic    Vision Patient Visual Report: No change from baseline           Communication Communication Communication: Impaired Factors Affecting Communication: Hearing impaired   Cognition Arousal: Alert Behavior During Therapy: WFL for tasks assessed/performed Cognition: Cognition impaired   Orientation impairments: Place, Time, Situation Awareness: Intellectual awareness impaired, Online awareness impaired   Attention impairment (select first level of impairment): Sustained attention Executive functioning impairment (select all impairments): Organization, Sequencing, Reasoning, Problem solving                   Following commands: Impaired Following commands impaired: Follows one step commands with increased time      Cueing   Cueing Techniques: Verbal cues, Tactile cues        General Comments Sp02 95% on 3 L02 at end of session    Pertinent Vitals/ Pain       Pain Assessment Pain Assessment: No/denies pain         Frequency  Min 2X/week        Progress Toward Goals  OT Goals(current goals  can now be found in the care plan section)  Progress towards OT goals: Progressing toward goals         Co-evaluation    PT/OT/SLP Co-Evaluation/Treatment: Yes Reason for Co-Treatment: To address functional/ADL transfers PT goals addressed during session: Mobility/safety with mobility OT goals addressed during session: ADL's and self-care      AM-PAC OT 6 Clicks Daily Activity     Outcome Measure   Help from another  person eating meals?: A Little Help from another person taking care of personal grooming?: A Little Help from another person toileting, which includes using toliet, bedpan, or urinal?: A Little Help from another person bathing (including washing, rinsing, drying)?: A Little Help from another person to put on and taking off regular upper body clothing?: A Little Help from another person to put on and taking off regular lower body clothing?: A Little 6 Click Score: 18    End of Session Equipment Utilized During Treatment: Rolling walker (2 wheels);Oxygen  OT Visit Diagnosis: Other abnormalities of gait and mobility (R26.89);Muscle weakness (generalized) (M62.81)   Activity Tolerance Patient tolerated treatment well   Patient Left in bed;with call bell/phone within reach;with bed alarm set   Nurse Communication          Time: 8850-8787 OT Time Calculation (min): 23 min  Charges: OT General Charges $OT Visit: 1 Visit OT Treatments $Therapeutic Activity: 8-22 mins  Izetta Claude, MS, OTR/L , CBIS ascom [phone]  08/27/2024, 1:58 PM

## 2024-08-28 DIAGNOSIS — J09X1 Influenza due to identified novel influenza A virus with pneumonia: Secondary | ICD-10-CM | POA: Diagnosis not present

## 2024-08-28 LAB — CULTURE, BLOOD (ROUTINE X 2)
Culture: NO GROWTH
Culture: NO GROWTH

## 2024-08-28 LAB — GLUCOSE, CAPILLARY: Glucose-Capillary: 107 mg/dL — ABNORMAL HIGH (ref 70–99)

## 2024-08-28 MED ORDER — HYDROXYZINE HCL 10 MG PO TABS: 10.0000 mg | ORAL_TABLET | Freq: Three times a day (TID) | ORAL | Status: DC | PRN

## 2024-08-28 MED ORDER — IPRATROPIUM-ALBUTEROL 0.5-2.5 (3) MG/3ML IN SOLN: 3.0000 mL | Freq: Four times a day (QID) | RESPIRATORY_TRACT | Status: DC | PRN

## 2024-08-28 MED ORDER — HYDROXYZINE HCL 10 MG PO TABS
10.0000 mg | ORAL_TABLET | Freq: Once | ORAL | Status: AC
  Administered 2024-08-28: 10 mg via ORAL
  Filled 2024-08-28: qty 1

## 2024-08-28 NOTE — Plan of Care (Signed)

## 2024-08-28 NOTE — Progress Notes (Signed)
 Patient refused bipap at this time. Patient pulling bipap off several times.

## 2024-08-28 NOTE — Progress Notes (Signed)
 Respiratory placed BiPap on patient and patient refused to keep BiPap on and continued pulling off BiPap. Patient is alert and oriented to self, RN attempted to educate patient but patient still refused to keep BiPap machine on. Patient placed back on O2

## 2024-08-28 NOTE — Plan of Care (Signed)
" °  Problem: Education: Goal: Knowledge of General Education information will improve Description: Including pain rating scale, medication(s)/side effects and non-pharmacologic comfort measures Outcome: Progressing   Problem: Health Behavior/Discharge Planning: Goal: Ability to manage health-related needs will improve Outcome: Progressing   Problem: Clinical Measurements: Goal: Ability to maintain clinical measurements within normal limits will improve Outcome: Progressing Goal: Will remain free from infection Outcome: Progressing Goal: Diagnostic test results will improve Outcome: Progressing Goal: Respiratory complications will improve Outcome: Progressing Goal: Cardiovascular complication will be avoided Outcome: Progressing   Problem: Coping: Goal: Level of anxiety will decrease Outcome: Progressing   Problem: Nutrition: Goal: Adequate nutrition will be maintained Outcome: Progressing   Problem: Elimination: Goal: Will not experience complications related to bowel motility Outcome: Progressing Goal: Will not experience complications related to urinary retention Outcome: Progressing   Problem: Pain Managment: Goal: General experience of comfort will improve and/or be controlled Outcome: Progressing   Problem: Education: Goal: Knowledge of disease or condition will improve Outcome: Progressing Goal: Knowledge of the prescribed therapeutic regimen will improve Outcome: Progressing Goal: Individualized Educational Video(s) Outcome: Progressing   Problem: Skin Integrity: Goal: Risk for impaired skin integrity will decrease Outcome: Progressing   Problem: Activity: Goal: Ability to tolerate increased activity will improve Outcome: Progressing Goal: Will verbalize the importance of balancing activity with adequate rest periods Outcome: Progressing   Problem: Respiratory: Goal: Ability to maintain a clear airway will improve Outcome: Progressing Goal: Levels of  oxygenation will improve Outcome: Progressing Goal: Ability to maintain adequate ventilation will improve Outcome: Progressing   "

## 2024-08-28 NOTE — Progress Notes (Signed)
 On going issue with patient understanding importance of keep bipap mask in place once its on his face. Patient is pleasant and always is agreeable to wear bipap but once its on his face he continuously pulls mask apart each time RT or RN goes in the fix alarming machine. He is now back on nasal cannula.

## 2024-08-28 NOTE — TOC Progression Note (Signed)
 Transition of Care [facility]) - Progression Note    Patient Details  Name: Anthony [name] MRN: [id number] Date of Birth: [birth year]  Transition of Care [facility]) CM/SW Contact  Daved JONETTA Hamilton, RN Phone Number: 08/28/2024, 12:55 PM  Clinical Narrative:     Spoke to patient's son [name] to update that therapy recommendations are still for SNF-STR. Don verbalized he would like someone from [facility] to come to the hospital and assess the patient for the patient to return to [facility] and decisions will be made from there.  Spoke with Justice at [facility], she verbalized she can come out tomorrow to assess the patient.   Spoke to [facility] to provide above information, he verbalized understanding.    Expected Discharge Plan: Skilled Nursing Facility Barriers to Discharge: Continued Medical Work up               Expected Discharge Plan and Services     Post Acute Care Choice:  (TBD) Living arrangements for the past 2 months: Assisted Living Facility                                       Social Drivers of Health (SDOH) Interventions SDOH Screenings   Food Insecurity: No Food Insecurity (08/18/2023)  Housing: Low Risk (08/24/2024)  Transportation Needs: Patient Unable To Answer (08/24/2024)  Utilities: Patient Unable To Answer (08/24/2024)  Depression (PHQ2-9): Low Risk (04/19/2023)  Financial Resource Strain: Low Risk (04/19/2023)  Physical Activity: Insufficiently Active (04/19/2023)  Social Connections: Patient Unable To Answer (08/24/2024)  Stress: No Stress Concern Present (04/19/2023)  Tobacco Use: Low Risk (08/23/2024)  Health Literacy: Adequate Health Literacy (04/19/2023)    Readmission Risk Interventions     No data to display

## 2024-08-28 NOTE — Progress Notes (Signed)
 Pt is hard to be weaned down from oxygen. This morning it was at 4L and saturating in mid 90s and reduced it to 2.5L pt's oxygen goes down to 88. It is now back to 3.5L and saturating at 94L

## 2024-08-28 NOTE — Progress Notes (Signed)
" °  Progress Note [name] FMW:[id number] DOB: [birth year] DOA: 08/23/2024     4 DOS: 08/28/2024   Brief hospital course: Anthony [name] is a 89 y.o. male with medical history significant of PAF status post ablation, HTN, chronic HFpEF, OSA on CPAP at bedtime, hypothyroidism, BPH, morbid obesity, sent from nursing home for evaluation of altered mentation.  He also had fever, chills and upper respite symptoms, hypoxemia of 86% on room air at admission.  He was found to have influenza, started on Tamiflu . He is medically stable for SNF when available. May need to dc with O2 and continue to wean post discharge.   Assessment and Plan:  Acute on chronic respiratory failure with hypoxemia and hypercapnia. Influenza A with bronchitis. Obstructive sleep apnea on CPAP. Started on Tamiflu , DuoNeb. Was briefly on bipap. Endorses minimal symptoms of cough and rhinorrhea.  He was started on steroids which is now stopped.  He is gradually improving and has weaned to 3Lnc - continue to wean O2 as tolerated - PT/OT, recommending SNF - procalcitonin <0.10. no antibiotics.    Acute metabolic encephalopathy. Patient had significant confusion 12/30.  Discussed with patient's son, patient had some mild short-term memory issue, has not been diagnosed with dementia. Altered mental status could be secondary to influenza and CO2 retention.  Mental status appears to be at baseline today.    Chronic diastolic congestive heart failure. Patient does not appear to be volume overloaded, lung sounds clear.  Chest x-ray did not show significant vascular congestion.  Check a proBNP level, was normal.  No volume overload.   Essential hypertension. Blood pressure running high, restarted home medicines including losartan .   Class II obesity with BMI 39.00, morbid obesity due to comorbidities  Diet and exercise.   Generalized weakness. Patient is seen by PT/OT, recommend nursing home  placement.    Subjective:  Patient reports feeling well. Denies SOB while at rest. No complaints of SOB. Mild dry cough is clearing up.   Physical Exam: Vitals:   08/27/24 2000 08/27/24 2041 08/28/24 0415 08/28/24 0423  BP: (!) 148/70  (!) 179/110 (!) 155/95  Pulse: 70  81 78  Resp: 18  17   Temp: 97.8 F (36.6 C)  98.6 F (37 C)   TempSrc: Oral     SpO2: 96% 95% 93%   Weight:      Height:       General exam: Appears calm and comfortable, obese. Respiratory system: Decreased breath sounds. Respiratory effort normal. No wheezing.  Cardiovascular system: S1 & S2 heard, RRR. No JVD, murmurs, rubs, gallops or clicks. No pedal edema. Central nervous system: Alert and oriented x2. No focal neurological deficits. Extremities: Symmetric 5 x 5 power. Skin: No rashes, lesions or ulcers Psychiatry: Judgement and insight appear normal. Mood & affect appropriate.   Data Reviewed:  Lab results reviewed  Family Communication: none at bedside  Disposition: Status is: Inpatient Remains inpatient appropriate because: Severity of disease, IV treatment     Time spent: 50 minutes  Author: Marien LITTIE Piety, MD 08/28/2024 7:27 AM  For on call review [email or web address].    "

## 2024-08-29 DIAGNOSIS — J09X1 Influenza due to identified novel influenza A virus with pneumonia: Secondary | ICD-10-CM | POA: Diagnosis not present

## 2024-08-29 MED ORDER — HYDROXYZINE HCL 25 MG PO TABS: 25.0000 mg | ORAL_TABLET | Freq: Three times a day (TID) | ORAL | Status: DC | PRN

## 2024-08-29 MED ORDER — HYDROXYZINE HCL 10 MG PO TABS
10.0000 mg | ORAL_TABLET | Freq: Three times a day (TID) | ORAL | Status: AC | PRN
  Administered 2024-08-29 (×2): 10 mg via ORAL
  Filled 2024-08-29 (×3): qty 1

## 2024-08-29 NOTE — Plan of Care (Signed)
" °  Problem: Respiratory: Goal: Ability to maintain a clear airway will improve Outcome: Progressing   Problem: Activity: Goal: Ability to tolerate increased activity will improve Outcome: Progressing   Problem: Safety: Goal: Ability to remain free from injury will improve Outcome: Progressing   Problem: Skin Integrity: Goal: Risk for impaired skin integrity will decrease Outcome: Progressing   "

## 2024-08-29 NOTE — [place] FL2 (Signed)
 " [place]  MEDICAID FL2 LEVEL OF CARE FORM     IDENTIFICATION  Patient Name: Anthony [name] Birthdate: Oct 15, 1935 Sex: male Admission Date (Current Location): 08/23/2024  [place] and [place] Number:  Chiropodist and Address:  [facility], [address], [place], [place] [address]      Provider Number: [id number]  Attending Physician Name and Address:  Lenon Marien LITTIE, MD  Relative Name and Phone Number:       Current Level of Care: Hospital Recommended Level of Care: Skilled Nursing Facility Prior Approval Number:    Date Approved/Denied:   PASRR Number: [id number]  Discharge Plan: SNF    Current Diagnoses: Patient Active Problem List   Diagnosis Date Noted   Acute on chronic respiratory failure with hypoxia and hypercapnia (HCC) 08/24/2024   Influenza A 08/23/2024   Acute respiratory failure with hypoxia (HCC) 08/23/2024   Influenza A with pneumonia 08/23/2024   Carotid stenosis 09/09/2023   History of stroke 09/09/2023   Bladder wall thickening 09/09/2023   Acute metabolic encephalopathy 08/17/2023   UTI (urinary tract infection) 08/17/2023   AMS (altered mental status) 08/17/2023   Statin myopathy 08/14/2023   Low serum vitamin B12 01/06/2023   DNR (do not resuscitate) 01/06/2023   Vascular dementia (HCC) 08/31/2022   Peptic ulcer disease 07/29/2022   Failed spinal cord stimulator 06/02/2022   Lumbar radiculopathy 05/08/2022   Lumbar spondylosis 01/31/2022   Lumbar degenerative disc disease 01/31/2022   Chronic pain syndrome 01/31/2022   Osteoarthritis of ankle 01/17/2022   Medicare annual wellness visit, subsequent 12/29/2021   Prediabetes 12/29/2021   Fatigue 12/31/2020   S/P endovascular aneurysm repair 04/2020   Eosinophilia 04/11/2020   Varicose veins of leg with pain, bilateral 04/06/2020   General unsteadiness 02/07/2020   Encounter for chronic pain management 07/04/2019   Essential tremor 05/07/2019    Right kidney mass 04/15/2019   Osteoarthritis 04/15/2019   Seasonal allergies 04/15/2019   Anosmia 02/17/2019   Advanced care planning/counseling discussion 11/05/2018   Vitamin D  deficiency 10/24/2018   CKD (chronic kidney disease) stage 3, GFR 30-59 ml/min (HCC) 10/18/2017   Therapeutic opioid-induced constipation (OIC) 10/03/2017   Chronic insomnia    Essential hypertension    Hypothyroidism    Severe obesity (BMI 35.0-39.9) with comorbidity (HCC)    CAD (coronary artery disease)    Chronic lower back pain    History of atrial flutter 04/15/2017   History of basal cell carcinoma (BCC) 04/12/2017   History of squamous cell carcinoma in situ of skin 04/12/2017   Hypercholesterolemia with hypertriglyceridemia 04/12/2017   Chronic constipation 04/12/2017   OSA on CPAP 04/12/2017   BPH with urinary obstruction 04/12/2017   (HFpEF) heart failure with preserved ejection fraction (HCC) 02/18/2017   Abdominal aortic aneurysm (AAA) without rupture 01/09/2016   Calculus of gallbladder without cholecystitis 01/09/2016   Diverticulitis of sigmoid colon 01/09/2016   Spondylolisthesis of lumbar region 01/09/2016   Recurrent major depression in remission 03/28/2013   Esophageal reflux 09/21/2011   Spinal stenosis 04/20/2011    Orientation RESPIRATION BLADDER Height & Weight     Self  O2 (3L) Continent Weight: (!) 303 lb 12.7 oz (137.8 kg) Height:  6' 2 (188 cm)  BEHAVIORAL SYMPTOMS/MOOD NEUROLOGICAL BOWEL NUTRITION STATUS      Incontinent Diet (Heart)  AMBULATORY STATUS COMMUNICATION OF NEEDS Skin   Limited Assist Verbally Normal  Personal Care Assistance Level of Assistance  Bathing, Feeding, Dressing Bathing Assistance: Limited assistance Feeding assistance: Independent Dressing Assistance: Limited assistance     Functional Limitations Info  Sight, Hearing, Speech Sight Info: Impaired Hearing Info: Adequate Speech Info: Adequate    SPECIAL CARE  FACTORS FREQUENCY  PT (By licensed PT), OT (By licensed OT)     PT Frequency: 5x/week OT Frequency: 5x/week            Contractures      Additional Factors Info  Code Status, Allergies Code Status Info: Full Allergies Info: Questran (Cholestyramine), Atorvastatin, Gemfibrozil, Metformin And Related, Rosuvastatin           Current Medications (08/29/2024):  This is the current hospital active medication list Current Facility-Administered Medications  Medication Dose Route Frequency Provider Last Rate Last Admin   acetaminophen  (TYLENOL ) tablet 650 mg  650 mg Oral Q6H PRN Laurita Manor T, MD       Or   acetaminophen  (TYLENOL ) suppository 650 mg  650 mg Rectal Q6H PRN Laurita Manor DASEN, MD       aspirin  EC tablet 81 mg  81 mg Oral Daily Laurita Manor T, MD   81 mg at 08/29/24 9061   clopidogrel  (PLAVIX ) tablet 75 mg  75 mg Oral Daily Zhang, Ping T, MD   75 mg at 08/29/24 9061   DULoxetine  (CYMBALTA ) DR capsule 20 mg  20 mg Oral Daily Zhang, Ping T, MD   20 mg at 08/29/24 0941   enoxaparin  (LOVENOX ) injection 70 mg  70 mg Subcutaneous QHS Laurita Manor T, MD   70 mg at 08/28/24 2229   ezetimibe  (ZETIA ) tablet 10 mg  10 mg Oral Daily Laurita Manor T, MD   10 mg at 08/29/24 0941   finasteride  (PROSCAR ) tablet 5 mg  5 mg Oral Daily Laurita Manor T, MD   5 mg at 08/29/24 9060   fluticasone  furoate-vilanterol (BREO ELLIPTA ) 200-25 MCG/ACT 1 puff  1 puff Inhalation Daily Laurita Pillion, MD   1 puff at 08/29/24 0944   furosemide  (LASIX ) tablet 40 mg  40 mg Oral Daily Zhang, Ping T, MD   40 mg at 08/29/24 9060   gabapentin  (NEURONTIN ) capsule 300 mg  300 mg Oral QHS Laurita Manor T, MD   300 mg at 08/28/24 2230   guaiFENesin  (MUCINEX ) 12 hr tablet 600 mg  600 mg Oral BID Laurita Manor T, MD   600 mg at 08/29/24 9061   guaiFENesin  (ROBITUSSIN) 100 MG/5ML liquid 5 mL  5 mL Oral Q4H PRN Laurita Pillion, MD   5 mL at 08/27/24 0556   hydrOXYzine (ATARAX) tablet 10 mg  10 mg Oral TID PRN Duncan, Hazel V, MD   10 mg at  08/29/24 0310   icosapent  Ethyl (VASCEPA ) 1 g capsule 2 g  2 g Oral BID Laurita Manor T, MD   2 g at 08/29/24 0941   ipratropium-albuterol  (DUONEB) 0.5-2.5 (3) MG/3ML nebulizer solution 3 mL  3 mL Nebulization Q6H PRN Lenon Marien CROME, MD       levothyroxine  (SYNTHROID ) tablet 175 mcg  175 mcg Oral QAC breakfast Laurita Manor T, MD   175 mcg at 08/29/24 0551   linaclotide  (LINZESS ) capsule 290 mcg  290 mcg Oral QAC breakfast Laurita Manor T, MD   290 mcg at 08/29/24 0941   losartan  (COZAAR ) tablet 100 mg  100 mg Oral Daily Zhang, Dekui, MD   100 mg at 08/29/24 0939   melatonin tablet 10 mg  10  mg Oral QHS Laurita Manor T, MD   10 mg at 08/28/24 2230   metoprolol  succinate (TOPROL -XL) 24 hr tablet 50 mg  50 mg Oral Daily Laurita Manor T, MD   50 mg at 08/29/24 9060   ondansetron  (ZOFRAN ) tablet 4 mg  4 mg Oral Q6H PRN Laurita Manor DASEN, MD       Or   ondansetron  (ZOFRAN ) injection 4 mg  4 mg Intravenous Q6H PRN Laurita Manor DASEN, MD       Oral care mouth rinse  15 mL Mouth Rinse PRN Lenon Marien CROME, MD       pantoprazole  (PROTONIX ) EC tablet 40 mg  40 mg Oral Daily Laurita Manor T, MD   40 mg at 08/29/24 9061   polyethylene glycol (MIRALAX  / GLYCOLAX ) packet 17 g  17 g Oral Daily Laurita Manor T, MD   17 g at 08/29/24 9058   sucralfate  (CARAFATE ) tablet 1 g  1 g Oral BID Laurita Manor T, MD   1 g at 08/29/24 9060   tamsulosin  (FLOMAX ) capsule 0.4 mg  0.4 mg Oral Daily Laurita Manor T, MD   0.4 mg at 08/29/24 9060     Discharge Medications: Please see discharge summary for a list of discharge medications.  Relevant Imaging Results:  Relevant Lab Results:   Additional Information SSN: [id number]  Jazira Maloney  [place], LCSW     "

## 2024-08-29 NOTE — Progress Notes (Signed)
 Physical Therapy Treatment Patient Details Name: Anthony [name] MRN: [id number] DOB: 12-23-1935 Today's Date: 08/29/2024   History of Present Illness 89 y/o male presented to ED on 08/23/24 for AMS after EMS called for lift assist at [facility] after unwitnessed fall. Admitted for acute hypoxic respiratory failure 2/2 Influenza A pneumonia. PMH: PAF s/p ablation, HTN, chronic HFpEF, OSA on CPAP, morbid obesity, BPH.    PT Comments  Patient received in bed, he demonstrates delayed processing, and impaired cognition. Patient requires cues and min A for supine to sit. Mod +2 for sit to stand from low bed. Patient is able to ambulate 25 feet with RW and cga. Fatigued with this, O2 sats remained in 90%s with O2 donned. Patient will continue to benefit from skilled PT to improve strength and functional independence.     If plan is discharge home, recommend the following: A lot of help with walking and/or transfers;A lot of help with bathing/dressing/bathroom;Direct supervision/assist for medications management;Direct supervision/assist for financial management;Supervision due to cognitive status   Can travel by private vehicle     No  Equipment Recommendations  None recommended by PT    Recommendations for Other Services       Precautions / Restrictions Precautions Precautions: Fall Recall of Precautions/Restrictions: Impaired Restrictions Weight Bearing Restrictions Per Provider Order: No     Mobility  Bed Mobility Overal bed mobility: Needs Assistance Bed Mobility: Supine to Sit     Supine to sit: Min assist     General bed mobility comments: requires min A to raise trunk to seated position. Increased time and cues needed to use bed rail    Transfers Overall transfer level: Needs assistance Equipment used: Rolling walker (2 wheels) Transfers: Sit to/from Stand Sit to Stand: Mod assist, +2 safety/equipment           General transfer comment: lifting assistance required for  standing.    Ambulation/Gait Ambulation/Gait assistance: Contact guard assist Gait Distance (Feet): 25 Feet Assistive device: Rolling walker (2 wheels) Gait Pattern/deviations: Step-through pattern, Decreased step length - right, Decreased step length - left, Decreased stride length Gait velocity: decreased     General Gait Details: patient ambulates with slow steady pace, cues for safe use of AD. Fatigued with this distance.   Stairs             Wheelchair Mobility     Tilt Bed    Modified Rankin (Stroke Patients Only)       Balance Overall balance assessment: Needs assistance Sitting-balance support: Feet supported Sitting balance-Leahy Scale: Good     Standing balance support: Bilateral upper extremity supported, During functional activity, Reliant on assistive device for balance Standing balance-Leahy Scale: Fair Standing balance comment: relying on rolling walker for support in standing                            Communication Communication Communication: Impaired Factors Affecting Communication: Hearing impaired  Cognition Arousal: Alert Behavior During Therapy: WFL for tasks assessed/performed   PT - Cognitive impairments: No family/caregiver present to determine baseline, Orientation, Memory, Safety/Judgement, Problem solving, Initiation   Orientation impairments: Time, Situation, Place                     Following commands: Impaired Following commands impaired: Follows one step commands with increased time    Cueing Cueing Techniques: Verbal cues, Gestural cues, Tactile cues  Exercises Other Exercises Other Exercises: Seated LE strengthening exercises:  LAQ, marching x 10 reps each    General Comments        Pertinent Vitals/Pain Pain Assessment Pain Assessment: No/denies pain    Home Living                          Prior Function            PT Goals (current goals can now be found in the care plan  section) Acute Rehab PT Goals Patient Stated Goal: did not state PT Goal Formulation: Patient unable to participate in goal setting Time For Goal Achievement: 09/07/24 Progress towards PT goals: Progressing toward goals    Frequency    Min 2X/week      PT Plan      Co-evaluation PT/OT/SLP Co-Evaluation/Treatment: Yes Reason for Co-Treatment: For patient/therapist safety;To address functional/ADL transfers PT goals addressed during session: Mobility/safety with mobility;Balance;Proper use of DME        AM-PAC PT 6 Clicks Mobility   Outcome Measure  Help needed turning from your back to your side while in a flat bed without using bedrails?: A Little Help needed moving from lying on your back to sitting on the side of a flat bed without using bedrails?: A Little Help needed moving to and from a bed to a chair (including a wheelchair)?: A Little Help needed standing up from a chair using your arms (e.g., wheelchair or bedside chair)?: A Lot Help needed to walk in hospital room?: A Little Help needed climbing 3-5 steps with a railing? : Total 6 Click Score: 15    End of Session Equipment Utilized During Treatment: Oxygen Activity Tolerance: Patient limited by fatigue Patient left: in chair;with call bell/phone within reach;with bed alarm set Nurse Communication: Mobility status PT Visit Diagnosis: Other abnormalities of gait and mobility (R26.89);Muscle weakness (generalized) (M62.81);Difficulty in walking, not elsewhere classified (R26.2);Unsteadiness on feet (R26.81)     Time: 8585-8561 PT Time Calculation (min) (ACUTE ONLY): 24 min  Charges:    $Gait Training: 8-22 mins PT General Charges $$ ACUTE PT VISIT: 1 Visit                     Alexah Kivett, PT, GCS 08/29/2024,2:51 PM

## 2024-08-29 NOTE — TOC Progression Note (Addendum)
 Transition of Care [facility]) - Progression Note    Patient Details  Name: Anthony [name] MRN: [id number] Date of Birth: March 08, 1936  [facility]) CM/SW Contact  Alvaro Louder, [place] Phone Number: 08/29/2024, 12:15 PM  Clinical Narrative:  3:53 PM: LCSWA spoke to POA (Son) He indicated that he is okay with searching for a rehab facility for the patient. He indicated that he has 3 choices 1. [facility] 2. [facility] 3. [facility]. TOC will give bed offers once beds are offered.  [facility] indicated that they will have an RN come evaluate the patient Monday    DME O2 ordered for patient through [employer]. Per [employer]. [facility] will be coming to evaluate patient today. Awaiting eval from facility.   TOC to follow for discharge    Expected Discharge Plan: Skilled Nursing Facility Barriers to Discharge: Continued Medical Work up               Expected Discharge Plan and Services     Post Acute Care Choice:  (TBD) Living arrangements for the past 2 months: Assisted Living Facility                                       Social Drivers of Health (SDOH) Interventions SDOH Screenings   Food Insecurity: No Food Insecurity (08/18/2023)  Housing: Low Risk (08/24/2024)  Transportation Needs: Patient Unable To Answer (08/24/2024)  Utilities: Patient Unable To Answer (08/24/2024)  Depression (PHQ2-9): Low Risk (04/19/2023)  Financial Resource Strain: Low Risk (04/19/2023)  Physical Activity: Insufficiently Active (04/19/2023)  Social Connections: Patient Unable To Answer (08/24/2024)  Stress: No Stress Concern Present (04/19/2023)  Tobacco Use: Low Risk (08/23/2024)  Health Literacy: Adequate Health Literacy (04/19/2023)    Readmission Risk Interventions     No data to display

## 2024-08-29 NOTE — Progress Notes (Signed)
 OT Cancellation Note  Patient Details Name: Anthony [name] MRN: [id number] DOB: 08-16-1936   Cancelled Treatment:    Reason Eval/Treat Not Completed: Other (comment). RN leaving pt's room upon attempt. RN notes pt just woke up and is beginning to eat breakfast. Will re-attempt at later time as pt is available.   Marico Buckle R., MPH, MS, OTR/L ascom [phone] 08/29/2024, 9:53 AM

## 2024-08-29 NOTE — Progress Notes (Signed)
" °  Progress Note [name] FMW:[id number] DOB: 25-Sep-1935 DOA: 08/23/2024     5 DOS: 08/29/2024   Brief hospital course: Anthony [name] is a 89 y.o. male with medical history significant of PAF status post ablation, HTN, chronic HFpEF, OSA on CPAP at bedtime, hypothyroidism, BPH, morbid obesity, sent from nursing home for evaluation of altered mentation.  He also had fever, chills and upper respite symptoms, hypoxemia of 86% on room air at admission.  He was found to have influenza, started on Tamiflu . He is medically stable for SNF vs ALF when available. May need to dc with O2 and continue to wean post discharge. Will continue with CPAP nightly and need re-evaluation outpatient once he has recovered fully whether or not he will need bipap at night.   Assessment and Plan:  Acute on chronic respiratory failure with hypoxemia and hypercapnia. Influenza A with bronchitis. Obstructive sleep apnea on CPAP. Started on Tamiflu , DuoNeb. Was briefly on bipap. Endorses minimal symptoms of cough and rhinorrhea.  He was started on steroids which is now stopped.  He is gradually improving and has weaned to 3Lnc - continue to wean O2 as tolerated - PT/OT, recommending SNF. His ALF is going to evaluate to see if he can return home which he and son prefer.  - procalcitonin <0.10. no antibiotics.    Acute metabolic encephalopathy. Patient had significant confusion 12/30.  Discussed with patient's son, patient had some mild short-term memory issue, has not been diagnosed with dementia. Altered mental status could be secondary to influenza and CO2 retention.   Mental status appears to be at baseline today.    Chronic diastolic congestive heart failure. Patient does not appear to be volume overloaded, lung sounds clear.  Chest x-ray did not show significant vascular congestion.  Check a proBNP level, was normal.  No volume overload.   Essential hypertension. Blood pressure running high, restarted  home medicines including losartan . improved   Class II obesity with BMI 39.00, morbid obesity due to comorbidities  Diet and exercise.   Generalized weakness. Patient is seen by PT/OT, recommend nursing home placement.    Subjective:  Patient reports feeling well. Denies concerns today. No dyspnea or CP.   Physical Exam: Vitals:   08/28/24 1202 08/28/24 1637 08/28/24 1852 08/29/24 0336  BP: (!) 157/83 (!) 147/121 (!) 158/86 (!) 157/69  Pulse: 60 66 68 (!) 55  Resp:  16  15  Temp:  97.8 F (36.6 C)  97.7 F (36.5 C)  TempSrc:    Axillary  SpO2:  95%  98%  Weight:      Height:       General exam: Appears calm and comfortable, obese. Respiratory system: Decreased breath sounds. Respiratory effort normal. No wheezing.  Cardiovascular system: S1 & S2 heard, RRR. No JVD, murmurs, rubs, gallops or clicks. No pedal edema. Central nervous system: Alert and oriented x2. No focal neurological deficits. Extremities: Symmetric 5 x 5 power. Skin: No rashes, lesions or ulcers Psychiatry: Judgement and insight appear normal. Mood & affect appropriate.   Data Reviewed:  Lab results reviewed  Family Communication: son at bedside  Disposition: Status is: Inpatient Remains inpatient appropriate because: Severity of disease, IV treatment     Time spent: 40 minutes  Author: Marien LITTIE Piety, MD 08/29/2024 7:32 AM  For on call review [email or web address]    "

## 2024-08-29 NOTE — Progress Notes (Signed)
 Occupational Therapy Treatment Patient Details Name: Anthony [name] MRN: [id number] DOB: 1935/11/26 Today's Date: 08/29/2024   History of present illness 89 y/o male presented to ED on 08/23/24 for AMS after EMS called for lift assist at [facility] after unwitnessed fall. Admitted for acute hypoxic respiratory failure 2/2 Influenza A pneumonia. PMH: PAF s/p ablation, HTN, chronic HFpEF, OSA on CPAP, morbid obesity, BPH.   OT comments  Pt seen for OT tx with PT co-tx to optimize safety with ADL mobility. Pt found on RA with Marion on forehead, SpO2 89-90% and pt endorsing mild SOB. O2 reapplied for session and improved to 95% after exertional activity. MIN A for bed mobility, MOD A +2 to stand EOB with VC for hand placement and RW positioning. Pt tolerated in-room mobility with heavy reliance on the RW. Pt completed grooming tasks in sitting during rest break. He demo'd mild difficulty with manipulation of small items such as the toothpaste 2/2 slight tremors but able to complete without direct assist. Pt endorsed being too fatigued after grooming and some ex with PT to trial further transfers/mobility. Pt continues to benefit from skilled OT services. Current recommendation remains appropriate.       If plan is discharge home, recommend the following:  A little help with walking and/or transfers;A little help with bathing/dressing/bathroom;Help with stairs or ramp for entrance   Equipment Recommendations  Other (comment) (defer)    Recommendations for Other Services      Precautions / Restrictions Precautions Precautions: Fall Recall of Precautions/Restrictions: Impaired Restrictions Weight Bearing Restrictions Per Provider Order: No       Mobility Bed Mobility Overal bed mobility: Needs Assistance Bed Mobility: Supine to Sit     Supine to sit: Min assist     General bed mobility comments: requires min A to raise trunk to seated position. Increased time and cues needed to use bed  rail    Transfers Overall transfer level: Needs assistance Equipment used: Rolling walker (2 wheels) Transfers: Sit to/from Stand Sit to Stand: Mod assist, +2 safety/equipment, +2 physical assistance           General transfer comment: lifting assistance required for standing.     Balance Overall balance assessment: Needs assistance Sitting-balance support: Feet supported Sitting balance-Leahy Scale: Good     Standing balance support: Bilateral upper extremity supported, During functional activity, Reliant on assistive device for balance Standing balance-Leahy Scale: Fair Standing balance comment: relying on rolling walker for support in standing                           ADL either performed or assessed with clinical judgement   ADL Overall ADL's : Needs assistance/impaired     Grooming: Sitting;Set up;Wash/dry face;Oral care;Supervision/safety Grooming Details (indicate cue type and reason): set up in sitting 2/2 fatigue after mobility, increased time to complete, mild difficulty managing toothpaste/cap 2/2 mild tremors                                    Extremity/Trunk Assessment              Vision       Perception     Praxis     Communication Communication Communication: Impaired Factors Affecting Communication: Hearing impaired   Cognition Arousal: Alert Behavior During Therapy: WFL for tasks assessed/performed Cognition: Cognition impaired, No family/caregiver present to determine baseline  OT - Cognition Comments: unsure of baseline cognition, oriented to self and followed 1 step commands with incr time                 Following commands: Impaired Following commands impaired: Follows one step commands with increased time      Cueing   Cueing Techniques: Verbal cues, Gestural cues, Tactile cues  Exercises      Shoulder Instructions       General Comments Pt found on RA with Gary on forehead,  SpO2 89-90% and pt endorsing mild SOB. Reapplied and improved to 95% after exertional activity.    Pertinent Vitals/ Pain       Pain Assessment Pain Assessment: No/denies pain  Home Living                                          Prior Functioning/Environment              Frequency  Min 2X/week        Progress Toward Goals  OT Goals(current goals can now be found in the care plan section)  Progress towards OT goals: Progressing toward goals  Acute Rehab OT Goals Patient Stated Goal: get better OT Goal Formulation: With patient Time For Goal Achievement: 09/08/24 Potential to Achieve Goals: Good  Plan      Co-evaluation    PT/OT/SLP Co-Evaluation/Treatment: Yes Reason for Co-Treatment: For patient/therapist safety;To address functional/ADL transfers PT goals addressed during session: Mobility/safety with mobility;Balance;Proper use of DME OT goals addressed during session: ADL's and self-care      AM-PAC OT 6 Clicks Daily Activity     Outcome Measure   Help from another person eating meals?: A Little Help from another person taking care of personal grooming?: A Little Help from another person toileting, which includes using toliet, bedpan, or urinal?: A Little Help from another person bathing (including washing, rinsing, drying)?: A Little Help from another person to put on and taking off regular upper body clothing?: A Little Help from another person to put on and taking off regular lower body clothing?: A Little 6 Click Score: 18    End of Session Equipment Utilized During Treatment: Rolling walker (2 wheels);Oxygen  OT Visit Diagnosis: Other abnormalities of gait and mobility (R26.89);Muscle weakness (generalized) (M62.81)   Activity Tolerance Patient tolerated treatment well   Patient Left in chair;with call bell/phone within reach;with chair alarm set   Nurse Communication          Time: 1413-1430 OT Time Calculation (min):  17 min  Charges: OT General Charges $OT Visit: 1 Visit OT Treatments $Self Care/Home Management : 8-22 mins  Warren SAUNDERS., MPH, MS, OTR/L ascom [phone] 08/29/2024, 3:06 PM

## 2024-08-29 NOTE — Plan of Care (Signed)
  Problem: Education: Goal: Knowledge of General Education information will improve Description: Including pain rating scale, medication(s)/side effects and non-pharmacologic comfort measures Outcome: Not Progressing   Problem: Health Behavior/Discharge Planning: Goal: Ability to manage health-related needs will improve Outcome: Not Progressing   Problem: Clinical Measurements: Goal: Ability to maintain clinical measurements within normal limits will improve Outcome: Not Progressing Goal: Will remain free from infection Outcome: Not Progressing Goal: Diagnostic test results will improve Outcome: Not Progressing Goal: Respiratory complications will improve Outcome: Not Progressing Goal: Cardiovascular complication will be avoided Outcome: Not Progressing   Problem: Activity: Goal: Risk for activity intolerance will decrease Outcome: Not Progressing   Problem: Nutrition: Goal: Adequate nutrition will be maintained Outcome: Not Progressing   Problem: Coping: Goal: Level of anxiety will decrease Outcome: Not Progressing   Problem: Elimination: Goal: Will not experience complications related to bowel motility Outcome: Not Progressing Goal: Will not experience complications related to urinary retention Outcome: Not Progressing   Problem: Pain Managment: Goal: General experience of comfort will improve and/or be controlled Outcome: Not Progressing   Problem: Safety: Goal: Ability to remain free from injury will improve Outcome: Not Progressing   Problem: Skin Integrity: Goal: Risk for impaired skin integrity will decrease Outcome: Not Progressing   Problem: Education: Goal: Knowledge of disease or condition will improve Outcome: Not Progressing Goal: Knowledge of the prescribed therapeutic regimen will improve Outcome: Not Progressing Goal: Individualized Educational Video(s) Outcome: Not Progressing   Problem: Activity: Goal: Ability to tolerate increased  activity will improve Outcome: Not Progressing Goal: Will verbalize the importance of balancing activity with adequate rest periods Outcome: Not Progressing   Problem: Respiratory: Goal: Ability to maintain a clear airway will improve Outcome: Not Progressing Goal: Levels of oxygenation will improve Outcome: Not Progressing Goal: Ability to maintain adequate ventilation will improve Outcome: Not Progressing

## 2024-08-30 DIAGNOSIS — J09X1 Influenza due to identified novel influenza A virus with pneumonia: Secondary | ICD-10-CM | POA: Diagnosis not present

## 2024-08-30 NOTE — Plan of Care (Signed)

## 2024-08-30 NOTE — Progress Notes (Signed)
" °  Progress Note [name] FMW:[id number] DOB: 07-01-1936 DOA: 08/23/2024     6 DOS: 08/30/2024   Brief hospital course: SEYED HEFFLEY is a 89 y.o. male with medical history significant of PAF status post ablation, HTN, chronic HFpEF, OSA on CPAP at bedtime, hypothyroidism, BPH, morbid obesity, sent from nursing home for evaluation of altered mentation.  He also had fever, chills and upper respite symptoms, hypoxemia of 86% on room air at admission.  He was found to have influenza, started on Tamiflu . He is medically stable for SNF vs ALF when available. May need to dc with O2 and continue to wean post discharge. Currently weaned to room air. Will continue with CPAP nightly and need re-evaluation outpatient once he has recovered fully whether or not he will need bipap at night.   Assessment and Plan:  Acute on chronic respiratory failure with hypoxemia and hypercapnia. Influenza A with bronchitis. Obstructive sleep apnea on CPAP. Was briefly on bipap. Endorses minimal symptoms of cough and rhinorrhea. - s/p tamiflu  and steroids He is gradually improving and has weaned to room air currently - continue to monitor O2 - PT/OT, recommending SNF. TOC is pursuing  - procalcitonin <0.10. no antibiotics.  - OOB as much as possible   Acute metabolic encephalopathy. Patient had significant confusion 12/30.  Discussed with patient's son, patient had some mild short-term memory issue, has not been diagnosed with dementia. Altered mental status could be secondary to influenza and CO2 retention.   Mental status appears to be at baseline today.    Chronic diastolic congestive heart failure. Patient does not appear to be volume overloaded, lung sounds clear.  Chest x-ray did not show significant vascular congestion.  Check a proBNP level, was normal.  No volume overload.   Essential hypertension. Blood pressure running high, restarted home medicines including losartan . improved   Class II  obesity with BMI 39.00, morbid obesity due to comorbidities  Diet and exercise.   Generalized weakness. Patient is seen by PT/OT, recommend nursing home placement.    Subjective:  Patient reports feeling well. Denies concerns today. No dyspnea or CP.   Physical Exam: Vitals:   08/29/24 1443 08/29/24 1704 08/29/24 2120 08/30/24 0517  BP:  (!) 143/84 137/67 (!) 109/45  Pulse:  73 63 62  Resp:  17 18 18   Temp:  98.4 F (36.9 C) 97.6 F (36.4 C) 98.3 F (36.8 C)  TempSrc:      SpO2: 95% 97% 95% 91%  Weight:      Height:       General exam: Appears calm and comfortable, obese. Respiratory system: Decreased breath sounds. Respiratory effort normal. No wheezing.  Cardiovascular system: S1 & S2 heard, RRR. No JVD, murmurs, rubs, gallops or clicks. No pedal edema. Central nervous system: Alert and oriented x2. No focal neurological deficits. Extremities: Symmetric 5 x 5 power. Skin: No rashes, lesions or ulcers Psychiatry: Judgement and insight appear normal. Mood & affect appropriate.   Data Reviewed:  Lab results reviewed  Family Communication: none at bedside  Disposition: Status is: Inpatient Remains inpatient appropriate because: Severity of disease, IV treatment     Time spent: 30 minutes  Author: Marien LITTIE Piety, MD 08/30/2024 7:09 AM  For on call review [email or web address].    "

## 2024-08-31 DIAGNOSIS — J09X1 Influenza due to identified novel influenza A virus with pneumonia: Secondary | ICD-10-CM | POA: Diagnosis not present

## 2024-08-31 NOTE — Plan of Care (Signed)
  Problem: Education: Goal: Knowledge of General Education information will improve Description: Including pain rating scale, medication(s)/side effects and non-pharmacologic comfort measures Outcome: Not Progressing   Problem: Health Behavior/Discharge Planning: Goal: Ability to manage health-related needs will improve Outcome: Not Progressing   Problem: Clinical Measurements: Goal: Ability to maintain clinical measurements within normal limits will improve Outcome: Not Progressing Goal: Will remain free from infection Outcome: Not Progressing Goal: Diagnostic test results will improve Outcome: Not Progressing Goal: Respiratory complications will improve Outcome: Not Progressing Goal: Cardiovascular complication will be avoided Outcome: Not Progressing   Problem: Activity: Goal: Risk for activity intolerance will decrease Outcome: Not Progressing   Problem: Nutrition: Goal: Adequate nutrition will be maintained Outcome: Not Progressing   Problem: Coping: Goal: Level of anxiety will decrease Outcome: Not Progressing   Problem: Elimination: Goal: Will not experience complications related to bowel motility Outcome: Not Progressing Goal: Will not experience complications related to urinary retention Outcome: Not Progressing   Problem: Pain Managment: Goal: General experience of comfort will improve and/or be controlled Outcome: Not Progressing   Problem: Safety: Goal: Ability to remain free from injury will improve Outcome: Not Progressing   Problem: Skin Integrity: Goal: Risk for impaired skin integrity will decrease Outcome: Not Progressing   Problem: Education: Goal: Knowledge of disease or condition will improve Outcome: Not Progressing Goal: Knowledge of the prescribed therapeutic regimen will improve Outcome: Not Progressing Goal: Individualized Educational Video(s) Outcome: Not Progressing   Problem: Activity: Goal: Ability to tolerate increased  activity will improve Outcome: Not Progressing Goal: Will verbalize the importance of balancing activity with adequate rest periods Outcome: Not Progressing   Problem: Respiratory: Goal: Ability to maintain a clear airway will improve Outcome: Not Progressing Goal: Levels of oxygenation will improve Outcome: Not Progressing Goal: Ability to maintain adequate ventilation will improve Outcome: Not Progressing

## 2024-08-31 NOTE — Progress Notes (Signed)
 BP 82/54 at 1511. MD Chelsey aware, instructed RN to retake BP in 30 mins. BP at 1551 159/84. Will continue to monitor.

## 2024-08-31 NOTE — Plan of Care (Signed)

## 2024-08-31 NOTE — Progress Notes (Signed)
 Physical Therapy Treatment Patient Details Name: Anthony [name] MRN: [id number] DOB: [birth year] Today's Date: 08/31/2024   History of Present Illness 89 y/o male presented to ED on 08/23/24 for AMS after EMS called for lift assist at [facility] after unwitnessed fall. Admitted for acute hypoxic respiratory failure 2/2 Influenza A pneumonia. PMH: PAF s/p ablation, HTN, chronic HFpEF, OSA on CPAP, morbid obesity, BPH.    PT Comments  Pt was long sitting in bed upon arrival. He had removed his O2 and was unaware. Sao2 on rm air maintained > 92%. RN made aware post session that he was left on rm air. Pt was able to exit bed, stand to RW, and tolerate ambulation ~ 50 ft in room. Distance limited by fatigue/SOB however vitals all remained stable. Acute PT will continue to follow and progress per current POC. DC recs remain appropriate since pt can not return to ALF from acute admission.    If plan is discharge home, recommend the following: A little help with walking and/or transfers;A little help with bathing/dressing/bathroom;Assistance with cooking/housework;Direct supervision/assist for medications management;Assist for transportation;Direct supervision/assist for financial management;Help with stairs or ramp for entrance;Supervision due to cognitive status     Equipment Recommendations  None recommended by PT       Precautions / Restrictions Precautions Precautions: Fall Recall of Precautions/Restrictions: Impaired Restrictions Weight Bearing Restrictions Per Provider Order: No     Mobility  Bed Mobility Overal bed mobility: Needs Assistance Bed Mobility: Supine to Sit  Supine to sit: Min assist   Transfers Overall transfer level: Needs assistance Equipment used: Rolling walker (2 wheels) Transfers: Sit to/from Stand Sit to Stand: Contact guard assist, Min assist   Ambulation/Gait Ambulation/Gait assistance: Contact guard assist Gait Distance (Feet): 50 Feet Assistive device:  Rolling walker (2 wheels) Gait Pattern/deviations: Step-through pattern Gait velocity: decreased  General Gait Details: Pt tolerated ambulation in room to doorway and back 2 x without seated rest. pt has I'ly removed O2 prior to arrival and pt able to maintain sao2 > 92% on rm air     Balance Overall balance assessment: Needs assistance Sitting-balance support: Feet supported Sitting balance-Leahy Scale: Good     Standing balance support: Bilateral upper extremity supported, During functional activity, Reliant on assistive device for balance Standing balance-Leahy Scale: Fair      Hotel Manager: No apparent difficulties  Cognition Arousal: Alert Behavior During Therapy: WFL for tasks assessed/performed   PT - Cognitive impairments: No family/caregiver present to determine baseline, Orientation, Memory, Safety/Judgement, Problem solving, Initiation   Orientation impairments: Situation, Time    PT - Cognition Comments: Pt was A and O x 2. seems to have improve cognition versus last session with PT Following commands: Intact Following commands impaired: Follows one step commands with increased time    Cueing Cueing Techniques: Verbal cues, Tactile cues         Pertinent Vitals/Pain Pain Assessment Pain Assessment: No/denies pain     PT Goals (current goals can now be found in the care plan section) Acute Rehab PT Goals Patient Stated Goal: none stated Progress towards PT goals: Progressing toward goals    Frequency    Min 2X/week           Co-evaluation     PT goals addressed during session: Mobility/safety with mobility;Balance;Proper use of DME;Strengthening/ROM        AM-PAC PT 6 Clicks Mobility   Outcome Measure  Help needed turning from your back to your side while in a flat  bed without using bedrails?: A Little Help needed moving from lying on your back to sitting on the side of a flat bed without using bedrails?: A  Little Help needed moving to and from a bed to a chair (including a wheelchair)?: A Little Help needed standing up from a chair using your arms (e.g., wheelchair or bedside chair)?: A Lot Help needed to walk in hospital room?: A Little Help needed climbing 3-5 steps with a railing? : A Lot 6 Click Score: 16    End of Session Equipment Utilized During Treatment: Oxygen (pt has removed O2 prior to author arrival. sao2 > 90 % on rm air. RN made aware pt left off O2) Activity Tolerance: Patient limited by fatigue Patient left: in chair;with call bell/phone within reach;with bed alarm set Nurse Communication: Mobility status PT Visit Diagnosis: Other abnormalities of gait and mobility (R26.89);Muscle weakness (generalized) (M62.81);Difficulty in walking, not elsewhere classified (R26.2);Unsteadiness on feet (R26.81)     Time: 8858-8841 PT Time Calculation (min) (ACUTE ONLY): 17 min  Charges:    $Gait Training: 8-22 mins PT General Charges $$ ACUTE PT VISIT: 1 Visit          Rankin Essex PTA 08/31/2024, 1:44 PM

## 2024-08-31 NOTE — Progress Notes (Signed)
" °  Progress Note [name] FMW:[id number] DOB: [birth year] DOA: 08/23/2024     7 DOS: 08/31/2024   Brief hospital course: Anthony [name] is a 89 y.o. male with medical history significant of PAF status post ablation, HTN, chronic HFpEF, OSA on CPAP at bedtime, hypothyroidism, BPH, morbid obesity, sent from nursing home for evaluation of altered mentation.  He also had fever, chills and upper respite symptoms, hypoxemia of 86% on room air at admission.  He was found to have influenza, started on Tamiflu . He is medically stable for SNF vs ALF when available. May need to dc with O2 and continue to wean post discharge. Currently weaned to room air intermittently during the day and using up to 3L at night/while sleeping. Likely due to not being adherent with his CPAP delivery.  Will continue with CPAP nightly and need re-evaluation outpatient once he has recovered fully whether or not he will need bipap at night.   Assessment and Plan:  Acute on chronic respiratory failure with hypoxemia and hypercapnia. Influenza A with bronchitis. Obstructive sleep apnea on CPAP. Was briefly on bipap. Endorses minimal symptoms of cough and rhinorrhea. - s/p tamiflu  and steroids He is gradually improving and has weaned to room air while awake and resting. Put back on when sleeping.  - continue to monitor O2 - PT/OT, recommending SNF. TOC is pursuing  - procalcitonin <0.10. no antibiotics.  - OOB as much as possible   Acute metabolic encephalopathy. Patient had significant confusion 12/30.  Discussed with patient's son, patient had some mild short-term memory issue, has not been diagnosed with dementia. Altered mental status could be secondary to influenza and CO2 retention.   Mental status appears to be at baseline today.    Chronic diastolic congestive heart failure. Patient does not appear to be volume overloaded, lung sounds clear.  Chest x-ray did not show significant vascular congestion.  Check  a proBNP level, was normal.  No volume overload.   Essential hypertension. Blood pressure running high, restarted home medicines including losartan . improved   Class II obesity with BMI 39.00, morbid obesity due to comorbidities  Diet and exercise.   Generalized weakness. Patient is seen by PT/OT, recommend nursing home placement.    Subjective:  Patient resting comfortably on 3Lnc.   Physical Exam: Vitals:   08/30/24 0517 08/30/24 0746 08/30/24 1527 08/31/24 0553  BP: (!) 109/45 (!) 143/70 136/69 (!) 161/91  Pulse: 62 67 71 64  Resp: 18 16 16 18   Temp: 98.3 F (36.8 C) 98 F (36.7 C) 98.3 F (36.8 C) (!) 97.5 F (36.4 C)  TempSrc:   Oral   SpO2: 91% 95% 92% 97%  Weight:      Height:       General exam: Appears calm and comfortable, obese. Respiratory system: Decreased breath sounds. Respiratory effort normal. No wheezing.  Cardiovascular system: S1 & S2 heard, RRR. No JVD, murmurs, rubs, gallops or clicks. No pedal edema. Central nervous system: sleeping. Extremities: no edema Skin: No rashes, lesions or ulcers  Data Reviewed:  Lab results reviewed  Family Communication: none at bedside  Disposition: Status is: Inpatient Remains inpatient appropriate because: Severity of disease, IV treatment     Time spent: 30 minutes  Author: Marien LITTIE Piety, MD 08/31/2024 7:16 AM  For on call review [email or web address].    "

## 2024-08-31 NOTE — TOC Progression Note (Signed)
 Transition of Care [facility]) - Progression Note    Patient Details  Name: Anthony [name] MRN: [id number] Date of Birth: 10-02-1935  Transition of [facility]) CM/SW Contact  [name], [place] Phone Number: 08/31/2024, 11:26 AM  Clinical Narrative:     CSW spoke with [facility]. [name] advised that the clinical team agrees that patient should go to rehab for PT before returning to the facility. Patient can not return to [facility] until he receives rehabilitation in skilled nursing.   Expected Discharge Plan: Skilled Nursing Facility Barriers to Discharge: Continued Medical Work up               Expected Discharge Plan and Services     Post Acute Care Choice:  (TBD) Living arrangements for the past 2 months: Assisted Living Facility                                       Social Drivers of Health (SDOH) Interventions SDOH Screenings   Food Insecurity: No Food Insecurity (08/18/2023)  Housing: Low Risk (08/24/2024)  Transportation Needs: Patient Unable To Answer (08/24/2024)  Utilities: Patient Unable To Answer (08/24/2024)  Depression (PHQ2-9): Low Risk (04/19/2023)  Financial Resource Strain: Low Risk (04/19/2023)  Physical Activity: Insufficiently Active (04/19/2023)  Social Connections: Patient Unable To Answer (08/24/2024)  Stress: No Stress Concern Present (04/19/2023)  Tobacco Use: Low Risk (08/23/2024)  Health Literacy: Adequate Health Literacy (04/19/2023)    Readmission Risk Interventions     No data to display

## 2024-09-01 MED ORDER — FLUTICASONE FUROATE-VILANTEROL 200-25 MCG/ACT IN AEPB: 1.0000 | INHALATION_SPRAY | Freq: Every day | RESPIRATORY_TRACT | Status: AC

## 2024-09-01 MED ORDER — GABAPENTIN 300 MG PO CAPS: 300.0000 mg | ORAL_CAPSULE | Freq: Every day | ORAL | Status: AC

## 2024-09-01 NOTE — Progress Notes (Signed)
 Report given to Demetri at [facility], room 10-01, # [phone]

## 2024-09-01 NOTE — Plan of Care (Signed)

## 2024-09-01 NOTE — Progress Notes (Incomplete)
 " Progress Note [name] FMW:[id number] DOB: 1936-07-14 DOA: 08/23/2024     8 DOS: 09/01/2024   Brief hospital course: Anthony [name] is a 89 y.o. male with medical history significant of PAF status post ablation, HTN, chronic HFpEF, OSA on CPAP at bedtime, hypothyroidism, BPH, morbid obesity, sent from nursing home for evaluation of altered mentation.  He also had fever, chills and upper respite symptoms, hypoxemia of 86% on room air at admission.  He was found to have influenza, started on Tamiflu . He is medically stable for SNF vs ALF when available. May need to dc with O2 and continue to wean post discharge. Currently weaned to room air intermittently during the day and using up to 3L at night/while sleeping. Likely due to not being adherent with his CPAP delivery.  Will continue with CPAP nightly and need re-evaluation outpatient once he has recovered fully whether or not he will need bipap at night.   Assessment and Plan:  Acute on chronic respiratory failure with hypoxemia and hypercapnia. Influenza A with bronchitis. Obstructive sleep apnea on CPAP. Was briefly on bipap. Endorses minimal symptoms of cough and rhinorrhea. - s/p tamiflu  and steroids He is gradually improving and has weaned to room air while awake and resting. Put back on when sleeping.  - continue to monitor O2 - PT/OT, recommending SNF. TOC is pursuing  - procalcitonin <0.10. no antibiotics.  - OOB as much as possible   Acute metabolic encephalopathy. Patient had significant confusion 12/30.  Discussed with patient's son, patient had some mild short-term memory issue, has not been diagnosed with dementia. Altered mental status could be secondary to influenza and CO2 retention.   Mental status appears to be at baseline today.    Chronic diastolic congestive heart failure. Patient does not appear to be volume overloaded, lung sounds clear.  Chest x-ray did not show significant vascular congestion.  Check  a proBNP level, was normal.  No volume overload.   Essential hypertension. Blood pressure running high, restarted home medicines including losartan . improved   Class II obesity with BMI 39.00, morbid obesity due to comorbidities  Diet and exercise.   Generalized weakness. Patient is seen by PT/OT, recommend nursing home placement.  {Tip this will not be part of the note when signed Body mass index is 39 kg/m. , ,  (Optional):26781}  Subjective:  Patient resting comfortably on 3Lnc.   Physical Exam: Vitals:   08/31/24 1511 08/31/24 1551 08/31/24 2042 09/01/24 0431  BP: (!) 82/54 (!) 159/84 138/85 134/84  Pulse: (!) 52  (!) 58 62  Resp: 17  18 18   Temp: 98 F (36.7 C)  98.3 F (36.8 C) 98.3 F (36.8 C)  TempSrc:   Oral Oral  SpO2: 96%  94% 96%  Weight:      Height:       General exam: Appears calm and comfortable, obese. Respiratory system: Decreased breath sounds. Respiratory effort normal. No wheezing.  Cardiovascular system: S1 & S2 heard, RRR. No JVD, murmurs, rubs, gallops or clicks. No pedal edema. Central nervous system: sleeping. Extremities: no edema Skin: No rashes, lesions or ulcers  Data Reviewed:  Lab results reviewed  Family Communication: none at bedside  Disposition: Status is: Inpatient Remains inpatient appropriate because: Severity of disease, IV treatment  {Tip this will not be part of the note when signed  DVT Prophylaxis  .,  (Optional):26781}   Time spent: 30 minutes  Author: Marien LITTIE Piety, MD 09/01/2024 7:19 AM  For  on call review [email or web address].    "

## 2024-09-01 NOTE — Progress Notes (Signed)
 Occupational Therapy Treatment Patient Details Name: Anthony [name] MRN: [id number] DOB: May 13, 1936 Today's Date: 09/01/2024   History of present illness 89 y/o male presented to ED on 08/23/24 for AMS after EMS called for lift assist at [facility] after unwitnessed fall. Admitted for acute hypoxic respiratory failure 2/2 Influenza A pneumonia. PMH: PAF s/p ablation, HTN, chronic HFpEF, OSA on CPAP, morbid obesity, BPH.   OT comments  Anthony [name] was seen for OT treatment on this date. Upon arrival to room pt recline din bed, agreeable to tx. Noted with O2 removed, SpO2 87% on RA at rest, O2 replaced and improved to 96% on 3L [place] with activity. Poor safety awareness and insight into need for O2/ Pt requires MIN A + RW for ADL t/f ~20 ft. Left in chair all needs in reach. Pt making good progress toward goals, will continue to follow POC. Discharge recommendation remains appropriate.       If plan is discharge home, recommend the following:  A little help with walking and/or transfers;A little help with bathing/dressing/bathroom;Help with stairs or ramp for entrance   Equipment Recommendations  BSC/3in1    Recommendations for Other Services      Precautions / Restrictions Precautions Precautions: Fall Recall of Precautions/Restrictions: Impaired Restrictions Weight Bearing Restrictions Per Provider Order: No       Mobility Bed Mobility Overal bed mobility: Needs Assistance Bed Mobility: Supine to Sit     Supine to sit: Min assist          Transfers Overall transfer level: Needs assistance Equipment used: Rolling walker (2 wheels) Transfers: Sit to/from Stand Sit to Stand: Min assist                 Balance Overall balance assessment: Needs assistance Sitting-balance support: Feet supported Sitting balance-Leahy Scale: Good     Standing balance support: During functional activity, Single extremity supported Standing balance-Leahy Scale: Fair                              ADL either performed or assessed with clinical judgement   ADL Overall ADL's : Needs assistance/impaired                                       General ADL Comments: MIN A + RW for ADL t/f ~20 ft.      Communication Communication Factors Affecting Communication: Hearing impaired   Cognition Arousal: Alert Behavior During Therapy: WFL for tasks assessed/performed Cognition: Cognition impaired, No family/caregiver present to determine baseline             OT - Cognition Comments: STM deficits, repeated cues                 Following commands: Intact Following commands impaired: Follows one step commands with increased time      Cueing   Cueing Techniques: Verbal cues, Tactile cues  Exercises      Shoulder Instructions       General Comments SpO2 87% on RA at rest, 96% with 3L [place]    Pertinent Vitals/ Pain       Pain Assessment Pain Assessment: No/denies pain   Frequency  Min 2X/week        Progress Toward Goals  OT Goals(current goals can now be found in the care plan section)  Progress towards OT goals: Progressing toward  goals  Acute Rehab OT Goals OT Goal Formulation: With patient Time For Goal Achievement: 09/08/24 Potential to Achieve Goals: Good ADL Goals Pt Will Perform Lower Body Dressing: with supervision;with modified independence;sitting/lateral leans;sit to/from stand Pt Will Transfer to Toilet: with modified independence;with supervision;ambulating Pt Will Perform Toileting - Clothing Manipulation and hygiene: with modified independence;sit to/from stand;sitting/lateral leans Additional ADL Goal #1: Pt will dem implementation of 1 learned ECS during ADL performance 2/2 trials to maximize safety/IND and prevent overexertion.  Plan      Co-evaluation                 AM-PAC OT 6 Clicks Daily Activity     Outcome Measure   Help from another person eating meals?: A Little Help from  another person taking care of personal grooming?: A Little Help from another person toileting, which includes using toliet, bedpan, or urinal?: A Little Help from another person bathing (including washing, rinsing, drying)?: A Little Help from another person to put on and taking off regular upper body clothing?: A Little Help from another person to put on and taking off regular lower body clothing?: A Little 6 Click Score: 18    End of Session Equipment Utilized During Treatment: Rolling walker (2 wheels);Oxygen  OT Visit Diagnosis: Other abnormalities of gait and mobility (R26.89);Muscle weakness (generalized) (M62.81)   Activity Tolerance Patient tolerated treatment well   Patient Left in chair;with call bell/phone within reach;with chair alarm set   Nurse Communication          Time: 8642-8590 OT Time Calculation (min): 12 min  Charges: OT General Charges $OT Visit: 1 Visit OT Treatments $Self Care/Home Management : 8-22 mins  Anthony [name], M.S. OTR/L  09/01/2024, 3:08 PM  ascom [phone]

## 2024-09-01 NOTE — Discharge Summary (Signed)
 "  Physician Discharge Summary  [name] FMW:[id number] DOB: [birth year]   Code Status: Limited: Do not attempt resuscitation (DNR) -DNR-LIMITED -Do Not Intubate/DNI  Admit date: 08/23/2024 Discharge date: 09/01/2024 Disposition: Skilled nursing facility PCP: Pcp, No  Recommendations for Outpatient Follow-up:  Follow up with PCP within 1-2 weeks Regarding general hospital follow up and preventative care Recommend considering repeat sleep study  Discharge Diagnoses:  Principal Problem:   Influenza A with pneumonia Active Problems:   OSA on CPAP   (HFpEF) heart failure with preserved ejection fraction (HCC)   Acute metabolic encephalopathy   Influenza A   Acute respiratory failure with hypoxia (HCC)   Acute on chronic respiratory failure with hypoxia and hypercapnia [facility])  Brief Hospital Course Summary: Anthony [name] is a 89 y.o. male with medical history significant of PAF status post ablation, HTN, chronic HFpEF, OSA on CPAP at bedtime, hypothyroidism, BPH, morbid obesity, sent from ALF for evaluation of altered mentation.  He also had fever, chills and upper respite symptoms, hypoxemia of 86% on room air at admission.  He was found to have influenza. He was started on 3Lnc and treated with Tamiflu  and supportive care. He transiently needed bipap. He has gradually been improving and is now stable ORA during the day while resting.  At night, he's supposed to wear CPAP but was not able to tolerate during admission. He instead has been wearing 3L [place] while sleeping due to desats and comfort.  I recommend that he be encouraged to wear CPAP at night and pursue a repeat sleep study once he is outside of window of his acute illness.  PT/OT evaluated and recommended SNF at dc.   He completed the tamiflu  course prior to dc. All other chronic conditions were treated with home medications.    Discharge Condition: Stable, improved Recommended discharge diet: Regular healthy  diet  Consultations: None   Procedures/Studies: None   Allergies as of 09/01/2024       Reactions   Questran [cholestyramine]    Patient not aware of an allergy to this medicine.   Atorvastatin    Muscle pain   Gemfibrozil Other (See Comments)   Muscle pain.   Metformin And Related    Dizziness   Rosuvastatin    Muscle pain        Medication List     STOP taking these medications    B-12 1000 MCG Subl   ciclopirox  8 % solution Commonly known as: PENLAC    diphenhydramine -acetaminophen  25-500 MG Tabs tablet Commonly known as: TYLENOL  PM   docusate sodium  100 MG capsule Commonly known as: COLACE   fluocinolone 0.025 % cream Commonly known as: SYNALAR   ondansetron  4 MG tablet Commonly known as: ZOFRAN    Oxycodone  HCl 10 MG Tabs   senna 8.6 MG tablet Commonly known as: SENOKOT   senna-docusate 8.6-50 MG tablet Commonly known as: Senokot-S   tizanidine  2 MG capsule Commonly known as: ZANAFLEX        TAKE these medications    acetaminophen  500 MG tablet Commonly known as: TYLENOL  Take 1 tablet (500 mg total) by mouth 2 (two) times daily as needed for moderate pain.   aspirin  EC 81 MG tablet Take 1 tablet (81 mg total) by mouth daily.   clopidogrel  75 MG tablet Commonly known as: PLAVIX  Take 1 tablet (75 mg total) by mouth daily.   Cymbalta  20 MG capsule Generic drug: DULoxetine  Take 1 capsule (20 mg total) by mouth daily.   ezetimibe   10 MG tablet Commonly known as: ZETIA  Take 1 tablet (10 mg total) by mouth daily.   finasteride  5 MG tablet Commonly known as: PROSCAR  TAKE 1 TABLET(5 MG) BY MOUTH DAILY   fluticasone  furoate-vilanterol 200-25 MCG/ACT Aepb Commonly known as: BREO ELLIPTA  Inhale 1 puff into the lungs daily. Start taking on: September 02, 2024   furosemide  40 MG tablet Commonly known as: LASIX  Take 1 tablet (40 mg total) by mouth daily.   gabapentin  300 MG capsule Commonly known as: NEURONTIN  Take 1 capsule (300 mg total)  by mouth at bedtime. What changed: how much to take   hydrocortisone  2.5 % cream APPLY TO FACE AT BEDTIME TUES, THURS, SAT for seborrheic dermatitis   ketoconazole  2 % shampoo Commonly known as: NIZORAL  apply three times per week, massage and lather into scalp and behind ears and leave in for 10 minutes before rinsing out  on Monday, Wed, Friday for seborrheic dermatitis   ketoconazole  2 % cream Commonly known as: NIZORAL  APPLY TO FACE AT BEDTIME MON, WED, FRI for seborrheic dermatitis   levothyroxine  175 MCG tablet Commonly known as: SYNTHROID  Take 1 tablet (175 mcg total) by mouth daily before breakfast.   lidocaine  4 % Place 1 patch onto the skin daily. OTC   Linzess  290 MCG Caps capsule Generic drug: linaclotide  TAKE 1 CAPSULE(290 MCG) BY MOUTH DAILY BEFORE BREAKFAST   losartan  100 MG tablet Commonly known as: COZAAR  Take 100 mg by mouth daily.   melatonin 5 MG Tabs Take 10 mg by mouth at bedtime.   meloxicam  7.5 MG tablet Commonly known as: MOBIC  Take 7.5 mg by mouth daily.   metoprolol  succinate 50 MG 24 hr tablet Commonly known as: TOPROL -XL Take 1 tablet (50 mg total) by mouth daily.   mometasone  0.1 % lotion Commonly known as: ELOCON  apply topically to areas behind ears and scalp nightly and leave on over night for seborrheic dermatitis   nitroGLYCERIN  0.4 MG SL tablet Commonly known as: NITROSTAT  Place 1 tablet (0.4 mg total) under the tongue every 5 (five) minutes as needed for chest pain.   polyethylene glycol 17 g packet Commonly known as: MIRALAX  / GLYCOLAX  Take 17 g by mouth daily.   Praluent  75 MG/ML Soaj Generic drug: Alirocumab  ADMINISTER 75MG  UNDER THE SKIN EVERY 14 DAYS   PRESERVISION AREDS 2 PO Take 1 tablet by mouth daily. What changed: Another medication with the same name was removed. Continue taking this medication, and follow the directions you see here.   Protonix  40 MG tablet Generic drug: pantoprazole  Take 1 tablet (40 mg  total) by mouth daily.   sucralfate  1 g tablet Commonly known as: CARAFATE  Take 1 g by mouth 2 (two) times daily.   tamsulosin  0.4 MG Caps capsule Commonly known as: FLOMAX  Take 1 capsule (0.4 mg total) by mouth daily.   Vascepa  1 g capsule Generic drug: icosapent  Ethyl Take 2 capsules (2 g total) by mouth 2 (two) times daily.   Vitamin D  (Cholecalciferol ) 25 MCG (1000 UT) Tabs Take 1 tablet by mouth daily.               Durable Medical Equipment  (From admission, onward)           Start     Ordered   08/29/24 1032  For home use only DME oxygen  Once       Question Answer Comment  Length of Need 6 Months   Mode or (Route) Nasal cannula   Liters per Minute  3   Frequency Continuous (stationary and portable oxygen unit needed)   Oxygen conserving device Yes   Oxygen delivery system: Gas      08/29/24 1032   08/25/24 1404  For home use only DME Bipap  Once       Question:  Length of Need  Answer:  Lifetime   08/25/24 1404            Contact information for after-discharge care     Destination     [facility] .   Service: Skilled Nursing Contact information: [address] [place]   [phone]                     Subjective   Pt reports feeling well. Denies SOB or CP.   All questions and concerns were addressed at time of discharge.  Objective  Blood pressure (!) 180/104, pulse (!) 57, temperature 98.4 F (36.9 C), resp. rate 17, height 6' 2 (1.88 m), weight (!) 137.8 kg, SpO2 98%.   General: Pt is alert, awake, not in acute distress Cardiovascular: RRR, S1/S2 +, no rubs, no gallops Respiratory: CTA bilaterally, no wheezing, no rhonchi Abdominal: Soft, NT, ND, bowel sounds + Extremities: no edema, no cyanosis  The results of significant diagnostics from this hospitalization (including imaging, microbiology, ancillary and laboratory) are listed below for reference.   Imaging  studies: CT CHEST WO CONTRAST Result Date: 08/27/2024 CLINICAL DATA:  Worsening hypoxia and hypercapnia. EXAM: CT CHEST WITHOUT CONTRAST TECHNIQUE: Multidetector CT imaging of the chest was performed following the standard protocol without IV contrast. RADIATION DOSE REDUCTION: This exam was performed according to the departmental dose-optimization program which includes automated exposure control, adjustment of the mA and/or kV according to patient size and/or use of iterative reconstruction technique. COMPARISON:  Chest radiographs dated 08/23/2024 and 08/17/2023. FINDINGS: Cardiovascular: Atheromatous calcifications, including the coronary arteries and aorta. Normal-sized heart. No pericardial effusion. Enlarged central pulmonary arteries, with a main pulmonary artery diameter of 3.3 cm, right main pulmonary artery diameter of 3.7 cm in left main pulmonary artery diameter of 3.2 cm. Dilated ascending thoracic aorta with a maximum diameter of 4.5 cm. The remainder of the thoracic aorta is normal in caliber. Mediastinum/Nodes: No enlarged mediastinal or axillary lymph nodes. Thyroid  gland, trachea, and esophagus demonstrate no significant findings. Lungs/Pleura: Small area of bronchiectasis with associated confluent and ground-glass opacity in the right upper lobe. Small area of bronchiectasis in the periphery of the left lower lobe with associated confluent opacity and linear atelectasis or scarring. Additional linear atelectasis or scarring elsewhere in the left lower lobe and in the posterior right upper lobe. No pleural fluid or lung nodules. Upper Abdomen: Atheromatous arterial calcifications. Minimal sludge in the dependent portion of the gallbladder. Musculoskeletal: Anterior right shoulder, right suprascapular and left shoulder subacromial bursal fluid. Marked left and moderate right shoulder degenerative changes. Extensive thoracic and lower cervical spine degenerative changes. IMPRESSION: 1. Small  areas of bronchiectasis with associated confluent and ground-glass opacity in the right upper lobe and left lower lobe. These opacities are most likely due to chronic inflammation. 2. Enlarged central pulmonary arteries, compatible with pulmonary arterial hypertension. 3. Ascending thoracic aortic aneurysm with a maximum diameter of 4.5 cm. Recommend semi-annual imaging followup by CTA or MRA and referral to cardiothoracic surgery if not already obtained. 4. Calcific coronary artery and aortic atherosclerosis. 5. Bilateral shoulder degenerative changes and bursal fluid. Aortic Atherosclerosis (ICD10-I70.0). Electronically Signed   By:  Elspeth Bathe M.D.   On: 08/27/2024 12:30   DG Chest Port 1 View Result Date: 08/23/2024 CLINICAL DATA:  Questionable sepsis - evaluate for abnormality EXAM: PORTABLE CHEST - 1 VIEW COMPARISON:  08/17/2023 FINDINGS: Cardiomediastinal silhouette and pulmonary vasculature are within normal limits. Lungs are clear. Advanced degenerative changes the LEFT glenohumeral joint. IMPRESSION: No acute cardiopulmonary process. Electronically Signed   By: Aliene Lloyd M.D.   On: 08/23/2024 13:34   CT HEAD WO CONTRAST ( ) Result Date: 08/23/2024 EXAM: CT HEAD WITHOUT CONTRAST 08/23/2024 12:51:59 PM TECHNIQUE: CT of the head was performed without the administration of intravenous contrast. Automated exposure control, iterative reconstruction, and/or weight based adjustment of the mA/kV was utilized to reduce the radiation dose to as low as reasonably achievable. COMPARISON: 08/17/2023 CLINICAL HISTORY: found down, altered FINDINGS: BRAIN AND VENTRICLES: No acute hemorrhage. No evidence of acute infarct. No hydrocephalus. No extra-axial collection. No mass effect or midline shift. Patchy white matter hypodensities, compatible with moderate chronic microvascular ischemic changes. Mild generalized parenchymal volume loss. Calcific atherosclerosis. ORBITS: No acute abnormality. SINUSES:  Scattered mucosal thickening in the ethmoid sinuses and maxillary sinuses with possible small mucous retention cysts. Prior endoscopic sinus surgery. SOFT TISSUES AND SKULL: No acute soft tissue abnormality. No skull fracture. IMPRESSION: 1. No acute intracranial abnormality. 2. Moderate chronic microvascular ischemic changes. 3. Mild generalized parenchymal volume loss. Electronically signed by: Donnice Mania MD 08/23/2024 01:10 PM EST RP Workstation: [id number]    Labs: Basic Metabolic Panel: Recent Labs  Lab 08/27/24 0433  NA 136  K 4.3  CL 94*  CO2 32  GLUCOSE 146*  BUN 38*  CREATININE 1.26*  CALCIUM 9.8  MG 2.1   CBC: Recent Labs  Lab 08/27/24 0433  WBC 6.6  HGB 13.0  HCT 39.4  MCV 95.2  PLT 219   Microbiology: Results for orders placed or performed during the hospital encounter of 08/23/24  Blood Culture (routine x 2)     Status: None   Collection Time: 08/23/24 12:20 PM   Specimen: BLOOD  Result Value Ref Range Status   Specimen Description BLOOD BLOOD LEFT FOREARM  Final   Special Requests   Final    BOTTLES DRAWN AEROBIC AND ANAEROBIC Blood Culture results may not be optimal due to an inadequate volume of blood received in culture bottles   Culture   Final    NO GROWTH 5 DAYS Performed at [facility], [address] [place], [place] [address]    Report Status 08/28/2024 FINAL  Final  Resp panel by RT-PCR (RSV, Flu A&B, Covid) Anterior Nasal Swab     Status: Abnormal   Collection Time: 08/23/24 12:20 PM   Specimen: Anterior Nasal Swab  Result Value Ref Range Status   SARS Coronavirus 2 by RT PCR NEGATIVE NEGATIVE Final    Comment: (NOTE) SARS-CoV-2 target nucleic acids are NOT DETECTED.  The SARS-CoV-2 RNA is generally detectable in upper respiratory specimens during the acute phase of infection. The lowest concentration of SARS-CoV-2 viral copies this assay can detect is 138 copies/mL. A negative result does not preclude SARS-Cov-2 infection  and should not be used as the sole basis for treatment or other patient management decisions. A negative result may occur with  improper specimen collection/handling, submission of specimen other than nasopharyngeal swab, presence of viral mutation(s) within the areas targeted by this assay, and inadequate number of viral copies(<138 copies/mL). A negative result must be combined with clinical observations, patient history, and epidemiological information. The  expected result is Negative.  Fact Sheet for Patients:  bloggercourse.com  Fact Sheet for Healthcare Providers:  seriousbroker.it  This test is no t yet approved or cleared by the [place]  FDA and  has been authorized for detection and/or diagnosis of SARS-CoV-2 by FDA under an Emergency Use Authorization (EUA). This EUA will remain  in effect (meaning this test can be used) for the duration of the COVID-19 declaration under Section 564(b)(1) of the Act, 21 U.S.C.section 360bbb-3(b)(1), unless the authorization is terminated  or revoked sooner.       Influenza A by PCR POSITIVE (A) NEGATIVE Final   Influenza B by PCR NEGATIVE NEGATIVE Final    Comment: (NOTE) The Xpert Xpress SARS-CoV-2/FLU/RSV plus assay is intended as an aid in the diagnosis of influenza from Nasopharyngeal swab specimens and should not be used as a sole basis for treatment. Nasal washings and aspirates are unacceptable for Xpert Xpress SARS-CoV-2/FLU/RSV testing.  Fact Sheet for Patients: bloggercourse.com  Fact Sheet for Healthcare Providers: seriousbroker.it  This test is not yet approved or cleared by the [place]  FDA and has been authorized for detection and/or diagnosis of SARS-CoV-2 by FDA under an Emergency Use Authorization (EUA). This EUA will remain in effect (meaning this test can be used) for the duration of the COVID-19  declaration under Section 564(b)(1) of the Act, 21 U.S.C. section 360bbb-3(b)(1), unless the authorization is terminated or revoked.     Resp Syncytial Virus by PCR NEGATIVE NEGATIVE Final    Comment: (NOTE) Fact Sheet for Patients: bloggercourse.com  Fact Sheet for Healthcare Providers: seriousbroker.it  This test is not yet approved or cleared by the [place]  FDA and has been authorized for detection and/or diagnosis of SARS-CoV-2 by FDA under an Emergency Use Authorization (EUA). This EUA will remain in effect (meaning this test can be used) for the duration of the COVID-19 declaration under Section 564(b)(1) of the Act, 21 U.S.C. section 360bbb-3(b)(1), unless the authorization is terminated or revoked.  Performed at [facility], [address] [place], [place] [address]   Blood Culture (routine x 2)     Status: None   Collection Time: 08/23/24 12:26 PM   Specimen: BLOOD  Result Value Ref Range Status   Specimen Description BLOOD LEFT ANTECUBITAL  Final   Special Requests   Final    BOTTLES DRAWN AEROBIC AND ANAEROBIC Blood Culture results may not be optimal due to an inadequate volume of blood received in culture bottles   Culture   Final    NO GROWTH 5 DAYS Performed at [facility], [address], [place], [place] [address]    Report Status 08/28/2024 FINAL  Final  MRSA Next Gen by PCR, Nasal     Status: None   Collection Time: 08/24/24  6:58 AM   Specimen: Nasal Mucosa; Nasal Swab  Result Value Ref Range Status   MRSA by PCR Next Gen NOT DETECTED NOT DETECTED Final    Comment: (NOTE) The GeneXpert MRSA Assay (FDA approved for NASAL specimens only), is one component of a comprehensive MRSA colonization surveillance program. It is not intended to diagnose MRSA infection nor to guide or monitor treatment for MRSA infections. Test performance is not FDA approved in patients less than 75  years old. Performed at [facility], [address] [place], [place] [address]     Time coordinating discharge: Over 30 minutes  Marien LITTIE Piety, MD  [facility] 09/01/2024, 1:27 PM  "

## 2024-09-01 NOTE — TOC Transition Note (Signed)
 Transition of Care [facility]) - Discharge Note   Patient Details  Name: Anthony [name] MRN: [id number] Date of Birth: 05/16/1936  [facility] CM/SW Contact:  Alvaro Louder, LCSW Phone Number: 09/01/2024, 4:18 PM   Clinical Narrative:   LCSWA received insurance approval for patient to admit to SNF. LCSWA confirmed with MD that patient is stable for discharge. LCSWA notified the patient and daughter and they are in agreement with discharge . LCSWA confirmed bed is available at [facility] Transport arranged with [facility] for next available.    RM 101 Number for report [phone]   TOC signing off  Final next level of care: Skilled Nursing Facility Barriers to Discharge: No Barriers Identified   Patient Goals and CMS Choice            Discharge Placement              Patient chooses bed at:  [facility]) Patient to be transferred to facility by: [facility] Name of family member notified: [name] Patient and family notified of of transfer: 09/01/24  Discharge Plan and Services Additional resources added to the After Visit Summary for       Post Acute Care Choice:  (TBD)                               Social Drivers of Health (SDOH) Interventions SDOH Screenings   Food Insecurity: No Food Insecurity (08/18/2023)  Housing: Low Risk (08/24/2024)  Transportation Needs: Patient Unable To Answer (08/24/2024)  Utilities: Patient Unable To Answer (08/24/2024)  Depression (PHQ2-9): Low Risk (04/19/2023)  Financial Resource Strain: Low Risk (04/19/2023)  Physical Activity: Insufficiently Active (04/19/2023)  Social Connections: Patient Unable To Answer (08/24/2024)  Stress: No Stress Concern Present (04/19/2023)  Tobacco Use: Low Risk (08/23/2024)  Health Literacy: Adequate Health Literacy (04/19/2023)     Readmission Risk Interventions     No data to display

## 2024-09-01 NOTE — Progress Notes (Signed)
 Dr. Lenon informed of BP 180/104. Give scheduled morning BP meds per MD and recheck. No new orders at this time

## 2024-09-02 LAB — BLOOD GAS, VENOUS
Acid-Base Excess: 11.2 mmol/L — ABNORMAL HIGH (ref 0.0–2.0)
Bicarbonate: 41.3 mmol/L — ABNORMAL HIGH (ref 20.0–28.0)
O2 Saturation: 48.6 %
Patient temperature: 37
pCO2, Ven: 84 mmHg (ref 44–60)
pH, Ven: 7.3 (ref 7.25–7.43)

## 2025-01-06 ENCOUNTER — Other Ambulatory Visit (INDEPENDENT_AMBULATORY_CARE_PROVIDER_SITE_OTHER)

## 2025-01-06 ENCOUNTER — Encounter (INDEPENDENT_AMBULATORY_CARE_PROVIDER_SITE_OTHER)

## 2025-01-06 ENCOUNTER — Ambulatory Visit (INDEPENDENT_AMBULATORY_CARE_PROVIDER_SITE_OTHER): Admitting: Vascular Surgery

## 2025-04-14 ENCOUNTER — Ambulatory Visit: Admitting: Dermatology
